# Patient Record
Sex: Female | Born: 1940 | Race: White | Hispanic: No | Marital: Married | Smoking: Never smoker
Health system: Southern US, Community
[De-identification: ages and names within clinical notes are randomized; demographics above are authoritative.]

## PROBLEM LIST (undated history)

## (undated) DIAGNOSIS — F419 Anxiety disorder, unspecified: Secondary | ICD-10-CM

## (undated) DIAGNOSIS — E785 Hyperlipidemia, unspecified: Secondary | ICD-10-CM

## (undated) DIAGNOSIS — M75102 Unspecified rotator cuff tear or rupture of left shoulder, not specified as traumatic: Secondary | ICD-10-CM

## (undated) DIAGNOSIS — A281 Cat-scratch disease: Secondary | ICD-10-CM

## (undated) DIAGNOSIS — F329 Major depressive disorder, single episode, unspecified: Secondary | ICD-10-CM

## (undated) DIAGNOSIS — E119 Type 2 diabetes mellitus without complications: Secondary | ICD-10-CM

## (undated) DIAGNOSIS — N3941 Urge incontinence: Secondary | ICD-10-CM

## (undated) DIAGNOSIS — I10 Essential (primary) hypertension: Secondary | ICD-10-CM

## (undated) DIAGNOSIS — F32A Depression, unspecified: Secondary | ICD-10-CM

## (undated) DIAGNOSIS — Z794 Long term (current) use of insulin: Secondary | ICD-10-CM

## (undated) DIAGNOSIS — R7981 Abnormal blood-gas level: Secondary | ICD-10-CM

## (undated) DIAGNOSIS — M1990 Unspecified osteoarthritis, unspecified site: Secondary | ICD-10-CM

## (undated) DIAGNOSIS — A048 Other specified bacterial intestinal infections: Secondary | ICD-10-CM

## (undated) DIAGNOSIS — E039 Hypothyroidism, unspecified: Secondary | ICD-10-CM

## (undated) DIAGNOSIS — I5041 Acute combined systolic (congestive) and diastolic (congestive) heart failure: Secondary | ICD-10-CM

## (undated) DIAGNOSIS — N390 Urinary tract infection, site not specified: Secondary | ICD-10-CM

## (undated) DIAGNOSIS — I2510 Atherosclerotic heart disease of native coronary artery without angina pectoris: Secondary | ICD-10-CM

## (undated) DIAGNOSIS — H3530 Unspecified macular degeneration: Secondary | ICD-10-CM

## (undated) DIAGNOSIS — K219 Gastro-esophageal reflux disease without esophagitis: Secondary | ICD-10-CM

## (undated) DIAGNOSIS — E042 Nontoxic multinodular goiter: Secondary | ICD-10-CM

## (undated) DIAGNOSIS — C44311 Basal cell carcinoma of skin of nose: Secondary | ICD-10-CM

## (undated) DIAGNOSIS — M7552 Bursitis of left shoulder: Secondary | ICD-10-CM

## (undated) HISTORY — DX: Acute combined systolic (congestive) and diastolic (congestive) heart failure: I50.41

## (undated) HISTORY — DX: Essential (primary) hypertension: I10

## (undated) HISTORY — DX: Unspecified macular degeneration: H35.30

## (undated) HISTORY — PX: BUNIONECTOMY WITH HAMMERTOE RECONSTRUCTION: SHX5600

## (undated) HISTORY — DX: Other specified bacterial intestinal infections: A04.8

## (undated) HISTORY — PX: COLONOSCOPY W/ POLYPECTOMY: SHX1380

## (undated) HISTORY — DX: Major depressive disorder, single episode, unspecified: F32.9

## (undated) HISTORY — DX: Nontoxic multinodular goiter: E04.2

## (undated) HISTORY — PX: ESOPHAGOGASTRODUODENOSCOPY ENDOSCOPY: SHX5814

## (undated) HISTORY — PX: JOINT REPLACEMENT: SHX530

## (undated) HISTORY — PX: APPENDECTOMY: SHX54

## (undated) HISTORY — PX: LIPOMA EXCISION: SHX5283

## (undated) HISTORY — PX: TONSILLECTOMY: SUR1361

## (undated) HISTORY — DX: Abnormal blood-gas level: R79.81

## (undated) HISTORY — DX: Hyperlipidemia, unspecified: E78.5

## (undated) HISTORY — DX: Depression, unspecified: F32.A

## (undated) HISTORY — PX: BACK SURGERY: SHX140

## (undated) HISTORY — PX: BLADDER NECK SUSPENSION: SHX1240

## (undated) HISTORY — PX: BASAL CELL CARCINOMA EXCISION: SHX1214

## (undated) HISTORY — PX: VAGINAL HYSTERECTOMY: SUR661

## (undated) HISTORY — DX: Unspecified osteoarthritis, unspecified site: M19.90

---

## 1994-08-17 ENCOUNTER — Encounter (INDEPENDENT_AMBULATORY_CARE_PROVIDER_SITE_OTHER): Payer: Self-pay | Admitting: *Deleted

## 1994-08-17 LAB — CONVERTED CEMR LAB

## 1998-06-06 ENCOUNTER — Ambulatory Visit (HOSPITAL_COMMUNITY): Admission: RE | Admit: 1998-06-06 | Discharge: 1998-06-06 | Payer: Self-pay | Admitting: Orthopedic Surgery

## 1998-06-20 ENCOUNTER — Encounter: Admission: RE | Admit: 1998-06-20 | Discharge: 1998-06-20 | Payer: Self-pay | Admitting: Family Medicine

## 1998-07-11 ENCOUNTER — Encounter: Admission: RE | Admit: 1998-07-11 | Discharge: 1998-07-11 | Payer: Self-pay | Admitting: Family Medicine

## 1998-08-08 ENCOUNTER — Encounter: Admission: RE | Admit: 1998-08-08 | Discharge: 1998-08-08 | Payer: Self-pay | Admitting: Family Medicine

## 1998-10-15 ENCOUNTER — Encounter: Admission: RE | Admit: 1998-10-15 | Discharge: 1998-10-15 | Payer: Self-pay | Admitting: Family Medicine

## 1998-10-17 ENCOUNTER — Encounter: Admission: RE | Admit: 1998-10-17 | Discharge: 1998-10-17 | Payer: Self-pay | Admitting: Family Medicine

## 1998-10-30 ENCOUNTER — Ambulatory Visit (HOSPITAL_COMMUNITY): Admission: RE | Admit: 1998-10-30 | Discharge: 1998-10-30 | Payer: Self-pay | Admitting: Orthopedic Surgery

## 1999-01-16 ENCOUNTER — Encounter: Admission: RE | Admit: 1999-01-16 | Discharge: 1999-01-16 | Payer: Self-pay | Admitting: Family Medicine

## 1999-01-21 ENCOUNTER — Encounter: Admission: RE | Admit: 1999-01-21 | Discharge: 1999-01-21 | Payer: Self-pay | Admitting: Family Medicine

## 1999-04-16 ENCOUNTER — Ambulatory Visit (HOSPITAL_COMMUNITY): Admission: RE | Admit: 1999-04-16 | Discharge: 1999-04-16 | Payer: Self-pay | Admitting: Family Medicine

## 1999-04-16 ENCOUNTER — Encounter: Payer: Self-pay | Admitting: Family Medicine

## 1999-04-24 ENCOUNTER — Ambulatory Visit (HOSPITAL_COMMUNITY): Admission: RE | Admit: 1999-04-24 | Discharge: 1999-04-24 | Payer: Self-pay | Admitting: Family Medicine

## 1999-04-24 ENCOUNTER — Encounter: Payer: Self-pay | Admitting: Family Medicine

## 1999-06-12 ENCOUNTER — Ambulatory Visit (HOSPITAL_COMMUNITY): Admission: RE | Admit: 1999-06-12 | Discharge: 1999-06-12 | Payer: Self-pay | Admitting: *Deleted

## 1999-06-12 ENCOUNTER — Encounter (INDEPENDENT_AMBULATORY_CARE_PROVIDER_SITE_OTHER): Payer: Self-pay | Admitting: Specialist

## 1999-12-06 ENCOUNTER — Encounter: Admission: RE | Admit: 1999-12-06 | Discharge: 1999-12-06 | Payer: Self-pay | Admitting: Family Medicine

## 1999-12-19 ENCOUNTER — Encounter: Admission: RE | Admit: 1999-12-19 | Discharge: 1999-12-19 | Payer: Self-pay | Admitting: Family Medicine

## 2000-01-13 ENCOUNTER — Encounter: Payer: Self-pay | Admitting: Sports Medicine

## 2000-01-13 ENCOUNTER — Encounter: Admission: RE | Admit: 2000-01-13 | Discharge: 2000-01-13 | Payer: Self-pay | Admitting: Sports Medicine

## 2000-02-03 ENCOUNTER — Encounter: Admission: RE | Admit: 2000-02-03 | Discharge: 2000-02-03 | Payer: Self-pay | Admitting: Family Medicine

## 2000-02-24 ENCOUNTER — Encounter: Admission: RE | Admit: 2000-02-24 | Discharge: 2000-02-24 | Payer: Self-pay | Admitting: Family Medicine

## 2000-04-06 ENCOUNTER — Encounter: Admission: RE | Admit: 2000-04-06 | Discharge: 2000-04-06 | Payer: Self-pay | Admitting: Family Medicine

## 2000-05-05 ENCOUNTER — Encounter: Payer: Self-pay | Admitting: Orthopedic Surgery

## 2000-05-06 ENCOUNTER — Inpatient Hospital Stay (HOSPITAL_COMMUNITY): Admission: RE | Admit: 2000-05-06 | Discharge: 2000-05-11 | Payer: Self-pay | Admitting: Orthopedic Surgery

## 2000-05-06 HISTORY — PX: TOTAL KNEE ARTHROPLASTY: SHX125

## 2000-05-08 ENCOUNTER — Encounter: Payer: Self-pay | Admitting: Orthopedic Surgery

## 2000-06-23 ENCOUNTER — Encounter: Admission: RE | Admit: 2000-06-23 | Discharge: 2000-06-23 | Payer: Self-pay | Admitting: Family Medicine

## 2000-07-17 ENCOUNTER — Encounter: Admission: RE | Admit: 2000-07-17 | Discharge: 2000-07-17 | Payer: Self-pay | Admitting: Family Medicine

## 2000-08-03 ENCOUNTER — Encounter: Admission: RE | Admit: 2000-08-03 | Discharge: 2000-08-03 | Payer: Self-pay | Admitting: Sports Medicine

## 2000-08-24 ENCOUNTER — Encounter: Admission: RE | Admit: 2000-08-24 | Discharge: 2000-08-24 | Payer: Self-pay | Admitting: Sports Medicine

## 2000-10-23 ENCOUNTER — Encounter: Admission: RE | Admit: 2000-10-23 | Discharge: 2000-10-23 | Payer: Self-pay | Admitting: Family Medicine

## 2000-12-07 ENCOUNTER — Ambulatory Visit (HOSPITAL_COMMUNITY): Admission: RE | Admit: 2000-12-07 | Discharge: 2000-12-07 | Payer: Self-pay | Admitting: Gastroenterology

## 2001-01-14 ENCOUNTER — Encounter: Payer: Self-pay | Admitting: Sports Medicine

## 2001-01-14 ENCOUNTER — Encounter: Admission: RE | Admit: 2001-01-14 | Discharge: 2001-01-14 | Payer: Self-pay | Admitting: Sports Medicine

## 2001-02-23 ENCOUNTER — Encounter: Admission: RE | Admit: 2001-02-23 | Discharge: 2001-02-23 | Payer: Self-pay | Admitting: Family Medicine

## 2001-06-04 ENCOUNTER — Encounter: Admission: RE | Admit: 2001-06-04 | Discharge: 2001-06-04 | Payer: Self-pay | Admitting: Family Medicine

## 2001-07-01 ENCOUNTER — Inpatient Hospital Stay (HOSPITAL_COMMUNITY): Admission: EM | Admit: 2001-07-01 | Discharge: 2001-07-05 | Payer: Self-pay | Admitting: Emergency Medicine

## 2001-07-01 ENCOUNTER — Encounter: Payer: Self-pay | Admitting: *Deleted

## 2001-07-01 ENCOUNTER — Encounter: Admission: RE | Admit: 2001-07-01 | Discharge: 2001-07-01 | Payer: Self-pay | Admitting: Family Medicine

## 2001-07-04 ENCOUNTER — Encounter: Payer: Self-pay | Admitting: Family Medicine

## 2001-07-26 ENCOUNTER — Encounter: Admission: RE | Admit: 2001-07-26 | Discharge: 2001-07-26 | Payer: Self-pay | Admitting: Family Medicine

## 2001-08-09 ENCOUNTER — Encounter: Payer: Self-pay | Admitting: Sports Medicine

## 2001-08-09 ENCOUNTER — Encounter: Admission: RE | Admit: 2001-08-09 | Discharge: 2001-08-09 | Payer: Self-pay | Admitting: Sports Medicine

## 2001-09-14 ENCOUNTER — Encounter: Admission: RE | Admit: 2001-09-14 | Discharge: 2001-09-14 | Payer: Self-pay | Admitting: Sports Medicine

## 2001-09-20 ENCOUNTER — Encounter: Admission: RE | Admit: 2001-09-20 | Discharge: 2001-09-20 | Payer: Self-pay | Admitting: Sports Medicine

## 2001-12-22 ENCOUNTER — Encounter: Admission: RE | Admit: 2001-12-22 | Discharge: 2001-12-22 | Payer: Self-pay | Admitting: Family Medicine

## 2002-01-17 ENCOUNTER — Encounter: Payer: Self-pay | Admitting: Sports Medicine

## 2002-01-17 ENCOUNTER — Encounter: Admission: RE | Admit: 2002-01-17 | Discharge: 2002-01-17 | Payer: Self-pay | Admitting: Sports Medicine

## 2002-02-01 ENCOUNTER — Encounter: Admission: RE | Admit: 2002-02-01 | Discharge: 2002-02-01 | Payer: Self-pay | Admitting: Family Medicine

## 2002-02-28 ENCOUNTER — Encounter: Admission: RE | Admit: 2002-02-28 | Discharge: 2002-02-28 | Payer: Self-pay | Admitting: Family Medicine

## 2002-03-01 ENCOUNTER — Encounter: Admission: RE | Admit: 2002-03-01 | Discharge: 2002-03-01 | Payer: Self-pay | Admitting: Sports Medicine

## 2002-03-01 ENCOUNTER — Encounter: Payer: Self-pay | Admitting: Sports Medicine

## 2002-04-26 ENCOUNTER — Encounter: Admission: RE | Admit: 2002-04-26 | Discharge: 2002-04-26 | Payer: Self-pay | Admitting: Sports Medicine

## 2002-08-30 ENCOUNTER — Encounter: Admission: RE | Admit: 2002-08-30 | Discharge: 2002-08-30 | Payer: Self-pay | Admitting: Sports Medicine

## 2002-09-30 ENCOUNTER — Encounter: Admission: RE | Admit: 2002-09-30 | Discharge: 2002-09-30 | Payer: Self-pay | Admitting: Family Medicine

## 2002-11-18 ENCOUNTER — Encounter: Admission: RE | Admit: 2002-11-18 | Discharge: 2002-11-18 | Payer: Self-pay | Admitting: Family Medicine

## 2002-11-22 ENCOUNTER — Encounter: Admission: RE | Admit: 2002-11-22 | Discharge: 2002-11-22 | Payer: Self-pay | Admitting: Family Medicine

## 2002-12-08 ENCOUNTER — Encounter: Admission: RE | Admit: 2002-12-08 | Discharge: 2002-12-08 | Payer: Self-pay | Admitting: Family Medicine

## 2003-01-11 ENCOUNTER — Encounter: Admission: RE | Admit: 2003-01-11 | Discharge: 2003-01-11 | Payer: Self-pay | Admitting: Family Medicine

## 2003-01-11 ENCOUNTER — Encounter: Payer: Self-pay | Admitting: Sports Medicine

## 2003-01-11 ENCOUNTER — Encounter: Admission: RE | Admit: 2003-01-11 | Discharge: 2003-01-11 | Payer: Self-pay | Admitting: Sports Medicine

## 2003-01-20 ENCOUNTER — Encounter: Admission: RE | Admit: 2003-01-20 | Discharge: 2003-01-20 | Payer: Self-pay | Admitting: Sports Medicine

## 2003-01-20 ENCOUNTER — Encounter: Payer: Self-pay | Admitting: Sports Medicine

## 2003-01-20 ENCOUNTER — Encounter: Admission: RE | Admit: 2003-01-20 | Discharge: 2003-01-20 | Payer: Self-pay | Admitting: Family Medicine

## 2003-02-06 ENCOUNTER — Encounter: Admission: RE | Admit: 2003-02-06 | Discharge: 2003-02-06 | Payer: Self-pay | Admitting: Family Medicine

## 2003-02-22 ENCOUNTER — Encounter: Admission: RE | Admit: 2003-02-22 | Discharge: 2003-02-22 | Payer: Self-pay | Admitting: Family Medicine

## 2003-03-09 ENCOUNTER — Encounter: Admission: RE | Admit: 2003-03-09 | Discharge: 2003-03-09 | Payer: Self-pay | Admitting: Family Medicine

## 2003-03-21 ENCOUNTER — Encounter: Payer: Self-pay | Admitting: Orthopedic Surgery

## 2003-03-28 ENCOUNTER — Observation Stay (HOSPITAL_COMMUNITY): Admission: RE | Admit: 2003-03-28 | Discharge: 2003-03-29 | Payer: Self-pay | Admitting: Orthopedic Surgery

## 2003-03-28 HISTORY — PX: SHOULDER OPEN ROTATOR CUFF REPAIR: SHX2407

## 2003-05-29 ENCOUNTER — Encounter: Admission: RE | Admit: 2003-05-29 | Discharge: 2003-05-29 | Payer: Self-pay | Admitting: Family Medicine

## 2003-08-21 ENCOUNTER — Encounter: Admission: RE | Admit: 2003-08-21 | Discharge: 2003-08-21 | Payer: Self-pay | Admitting: Family Medicine

## 2003-09-22 ENCOUNTER — Encounter: Admission: RE | Admit: 2003-09-22 | Discharge: 2003-09-22 | Payer: Self-pay | Admitting: Family Medicine

## 2003-10-02 ENCOUNTER — Encounter: Admission: RE | Admit: 2003-10-02 | Discharge: 2003-10-02 | Payer: Self-pay | Admitting: Family Medicine

## 2003-12-18 ENCOUNTER — Encounter: Admission: RE | Admit: 2003-12-18 | Discharge: 2003-12-18 | Payer: Self-pay | Admitting: Family Medicine

## 2004-01-25 ENCOUNTER — Encounter: Admission: RE | Admit: 2004-01-25 | Discharge: 2004-01-25 | Payer: Self-pay | Admitting: Sports Medicine

## 2004-03-04 ENCOUNTER — Encounter: Admission: RE | Admit: 2004-03-04 | Discharge: 2004-03-04 | Payer: Self-pay | Admitting: Family Medicine

## 2004-03-12 ENCOUNTER — Encounter: Admission: RE | Admit: 2004-03-12 | Discharge: 2004-03-12 | Payer: Self-pay | Admitting: Sports Medicine

## 2004-06-15 ENCOUNTER — Emergency Department (HOSPITAL_COMMUNITY): Admission: EM | Admit: 2004-06-15 | Discharge: 2004-06-15 | Payer: Self-pay | Admitting: Family Medicine

## 2004-09-05 ENCOUNTER — Ambulatory Visit: Payer: Self-pay | Admitting: Family Medicine

## 2004-12-09 ENCOUNTER — Ambulatory Visit: Payer: Self-pay | Admitting: Family Medicine

## 2004-12-10 ENCOUNTER — Ambulatory Visit: Payer: Self-pay | Admitting: Family Medicine

## 2004-12-25 ENCOUNTER — Ambulatory Visit: Payer: Self-pay | Admitting: Family Medicine

## 2005-01-27 ENCOUNTER — Encounter: Admission: RE | Admit: 2005-01-27 | Discharge: 2005-01-27 | Payer: Self-pay | Admitting: Sports Medicine

## 2005-02-11 ENCOUNTER — Ambulatory Visit (HOSPITAL_COMMUNITY): Admission: RE | Admit: 2005-02-11 | Discharge: 2005-02-11 | Payer: Self-pay | Admitting: Family Medicine

## 2005-02-11 ENCOUNTER — Ambulatory Visit: Payer: Self-pay | Admitting: Family Medicine

## 2005-02-14 ENCOUNTER — Ambulatory Visit (HOSPITAL_COMMUNITY): Admission: RE | Admit: 2005-02-14 | Discharge: 2005-02-14 | Payer: Self-pay | Admitting: Sports Medicine

## 2005-02-17 ENCOUNTER — Ambulatory Visit: Payer: Self-pay | Admitting: Family Medicine

## 2005-02-20 ENCOUNTER — Encounter (INDEPENDENT_AMBULATORY_CARE_PROVIDER_SITE_OTHER): Payer: Self-pay | Admitting: Cardiology

## 2005-02-20 ENCOUNTER — Ambulatory Visit: Admission: RE | Admit: 2005-02-20 | Discharge: 2005-02-20 | Payer: Self-pay | Admitting: Sports Medicine

## 2005-04-04 ENCOUNTER — Ambulatory Visit: Payer: Self-pay | Admitting: Family Medicine

## 2005-04-15 ENCOUNTER — Ambulatory Visit: Payer: Self-pay | Admitting: Family Medicine

## 2005-04-29 ENCOUNTER — Ambulatory Visit: Payer: Self-pay | Admitting: Family Medicine

## 2005-05-13 ENCOUNTER — Ambulatory Visit: Payer: Self-pay | Admitting: Family Medicine

## 2005-05-29 ENCOUNTER — Ambulatory Visit: Payer: Self-pay | Admitting: Family Medicine

## 2005-06-16 ENCOUNTER — Ambulatory Visit: Payer: Self-pay | Admitting: Family Medicine

## 2005-07-07 ENCOUNTER — Ambulatory Visit: Payer: Self-pay | Admitting: Family Medicine

## 2005-07-16 ENCOUNTER — Ambulatory Visit: Payer: Self-pay | Admitting: Family Medicine

## 2005-07-22 ENCOUNTER — Ambulatory Visit: Payer: Self-pay | Admitting: Sports Medicine

## 2005-07-22 ENCOUNTER — Encounter: Admission: RE | Admit: 2005-07-22 | Discharge: 2005-07-22 | Payer: Self-pay | Admitting: Sports Medicine

## 2005-09-29 ENCOUNTER — Ambulatory Visit: Payer: Self-pay | Admitting: Family Medicine

## 2005-12-29 ENCOUNTER — Ambulatory Visit: Payer: Self-pay | Admitting: Family Medicine

## 2006-01-28 ENCOUNTER — Ambulatory Visit: Payer: Self-pay | Admitting: Family Medicine

## 2006-02-04 ENCOUNTER — Encounter: Admission: RE | Admit: 2006-02-04 | Discharge: 2006-02-04 | Payer: Self-pay | Admitting: Sports Medicine

## 2006-02-25 ENCOUNTER — Ambulatory Visit: Payer: Self-pay | Admitting: Family Medicine

## 2006-03-27 ENCOUNTER — Ambulatory Visit: Payer: Self-pay | Admitting: Family Medicine

## 2006-04-22 ENCOUNTER — Ambulatory Visit (HOSPITAL_COMMUNITY): Admission: RE | Admit: 2006-04-22 | Discharge: 2006-04-22 | Payer: Self-pay | Admitting: Gastroenterology

## 2006-04-22 ENCOUNTER — Encounter (INDEPENDENT_AMBULATORY_CARE_PROVIDER_SITE_OTHER): Payer: Self-pay | Admitting: *Deleted

## 2006-05-21 ENCOUNTER — Ambulatory Visit: Payer: Self-pay | Admitting: Family Medicine

## 2006-06-23 ENCOUNTER — Ambulatory Visit: Payer: Self-pay | Admitting: Family Medicine

## 2006-07-07 ENCOUNTER — Ambulatory Visit: Payer: Self-pay | Admitting: Sports Medicine

## 2006-07-28 ENCOUNTER — Ambulatory Visit: Payer: Self-pay | Admitting: Sports Medicine

## 2006-08-18 ENCOUNTER — Ambulatory Visit: Payer: Self-pay | Admitting: Sports Medicine

## 2006-09-13 ENCOUNTER — Emergency Department (HOSPITAL_COMMUNITY): Admission: EM | Admit: 2006-09-13 | Discharge: 2006-09-13 | Payer: Self-pay | Admitting: Emergency Medicine

## 2006-09-17 ENCOUNTER — Ambulatory Visit: Payer: Self-pay | Admitting: Sports Medicine

## 2006-09-18 ENCOUNTER — Ambulatory Visit: Payer: Self-pay | Admitting: Family Medicine

## 2006-10-12 ENCOUNTER — Ambulatory Visit: Payer: Self-pay | Admitting: Sports Medicine

## 2006-10-20 ENCOUNTER — Ambulatory Visit: Payer: Self-pay | Admitting: Family Medicine

## 2006-11-05 ENCOUNTER — Ambulatory Visit (HOSPITAL_COMMUNITY): Admission: RE | Admit: 2006-11-05 | Discharge: 2006-11-05 | Payer: Self-pay | Admitting: Gastroenterology

## 2006-11-16 ENCOUNTER — Encounter: Admission: RE | Admit: 2006-11-16 | Discharge: 2006-11-16 | Payer: Self-pay | Admitting: Orthopedic Surgery

## 2006-12-14 ENCOUNTER — Ambulatory Visit: Payer: Self-pay | Admitting: Sports Medicine

## 2006-12-25 ENCOUNTER — Encounter: Payer: Self-pay | Admitting: Family Medicine

## 2006-12-25 ENCOUNTER — Ambulatory Visit: Payer: Self-pay | Admitting: Family Medicine

## 2006-12-25 LAB — CONVERTED CEMR LAB
BUN: 21 mg/dL (ref 6–23)
Direct LDL: 86 mg/dL
Potassium: 4.4 meq/L (ref 3.5–5.3)
Sodium: 143 meq/L (ref 135–145)

## 2007-01-04 ENCOUNTER — Inpatient Hospital Stay (HOSPITAL_COMMUNITY): Admission: RE | Admit: 2007-01-04 | Discharge: 2007-01-06 | Payer: Self-pay | Admitting: Orthopedic Surgery

## 2007-01-14 DIAGNOSIS — J309 Allergic rhinitis, unspecified: Secondary | ICD-10-CM | POA: Insufficient documentation

## 2007-01-14 DIAGNOSIS — E039 Hypothyroidism, unspecified: Secondary | ICD-10-CM

## 2007-01-14 DIAGNOSIS — M5430 Sciatica, unspecified side: Secondary | ICD-10-CM

## 2007-01-14 DIAGNOSIS — I10 Essential (primary) hypertension: Secondary | ICD-10-CM

## 2007-01-14 DIAGNOSIS — E669 Obesity, unspecified: Secondary | ICD-10-CM

## 2007-01-14 DIAGNOSIS — E1165 Type 2 diabetes mellitus with hyperglycemia: Secondary | ICD-10-CM

## 2007-01-14 DIAGNOSIS — E7800 Pure hypercholesterolemia, unspecified: Secondary | ICD-10-CM

## 2007-01-14 DIAGNOSIS — M129 Arthropathy, unspecified: Secondary | ICD-10-CM | POA: Insufficient documentation

## 2007-01-15 ENCOUNTER — Encounter (INDEPENDENT_AMBULATORY_CARE_PROVIDER_SITE_OTHER): Payer: Self-pay | Admitting: *Deleted

## 2007-01-19 ENCOUNTER — Telehealth: Payer: Self-pay | Admitting: *Deleted

## 2007-02-04 ENCOUNTER — Ambulatory Visit: Payer: Self-pay | Admitting: Family Medicine

## 2007-02-04 ENCOUNTER — Encounter: Payer: Self-pay | Admitting: Family Medicine

## 2007-02-04 DIAGNOSIS — F329 Major depressive disorder, single episode, unspecified: Secondary | ICD-10-CM

## 2007-02-17 ENCOUNTER — Encounter: Payer: Self-pay | Admitting: Family Medicine

## 2007-02-17 ENCOUNTER — Encounter: Admission: RE | Admit: 2007-02-17 | Discharge: 2007-02-17 | Payer: Self-pay | Admitting: Family Medicine

## 2007-03-23 ENCOUNTER — Telehealth: Payer: Self-pay | Admitting: *Deleted

## 2007-03-31 ENCOUNTER — Encounter: Payer: Self-pay | Admitting: Family Medicine

## 2007-03-31 ENCOUNTER — Ambulatory Visit: Payer: Self-pay | Admitting: Family Medicine

## 2007-05-12 ENCOUNTER — Ambulatory Visit: Payer: Self-pay | Admitting: Family Medicine

## 2007-05-12 ENCOUNTER — Telehealth: Payer: Self-pay | Admitting: *Deleted

## 2007-05-19 ENCOUNTER — Telehealth: Payer: Self-pay | Admitting: Family Medicine

## 2007-05-28 ENCOUNTER — Encounter (INDEPENDENT_AMBULATORY_CARE_PROVIDER_SITE_OTHER): Payer: Self-pay | Admitting: *Deleted

## 2007-05-28 ENCOUNTER — Ambulatory Visit: Payer: Self-pay | Admitting: Family Medicine

## 2007-05-28 ENCOUNTER — Telehealth: Payer: Self-pay | Admitting: *Deleted

## 2007-05-28 LAB — CONVERTED CEMR LAB
ALT: 14 units/L (ref 0–35)
AST: 15 units/L (ref 0–37)
Albumin: 4.5 g/dL (ref 3.5–5.2)
Alkaline Phosphatase: 53 units/L (ref 39–117)
Basophils Absolute: 0 10*3/uL (ref 0.0–0.1)
Basophils Relative: 0 % (ref 0–1)
Eosinophils Absolute: 0.1 10*3/uL (ref 0.0–0.7)
Lymphs Abs: 2.4 10*3/uL (ref 0.7–3.3)
MCV: 91.7 fL (ref 78.0–100.0)
Neutrophils Relative %: 56 % (ref 43–77)
Platelets: 128 10*3/uL — ABNORMAL LOW (ref 150–400)
Potassium: 4.1 meq/L (ref 3.5–5.3)
Sodium: 141 meq/L (ref 135–145)
Total Protein: 7.2 g/dL (ref 6.0–8.3)
WBC: 6.7 10*3/uL (ref 4.0–10.5)

## 2007-05-31 ENCOUNTER — Encounter (INDEPENDENT_AMBULATORY_CARE_PROVIDER_SITE_OTHER): Payer: Self-pay | Admitting: *Deleted

## 2007-06-02 ENCOUNTER — Encounter: Admission: RE | Admit: 2007-06-02 | Discharge: 2007-06-02 | Payer: Self-pay | Admitting: Sports Medicine

## 2007-06-07 ENCOUNTER — Ambulatory Visit: Payer: Self-pay | Admitting: Family Medicine

## 2007-06-29 ENCOUNTER — Ambulatory Visit: Payer: Self-pay | Admitting: Family Medicine

## 2007-06-29 ENCOUNTER — Encounter: Payer: Self-pay | Admitting: Family Medicine

## 2007-06-29 LAB — CONVERTED CEMR LAB
Albumin: 4.3 g/dL (ref 3.5–5.2)
BUN: 26 mg/dL — ABNORMAL HIGH (ref 6–23)
CO2: 23 meq/L (ref 19–32)
Calcium: 9.7 mg/dL (ref 8.4–10.5)
Chloride: 109 meq/L (ref 96–112)
Cholesterol: 156 mg/dL (ref 0–200)
Creatinine, Ser: 0.91 mg/dL (ref 0.40–1.20)
HDL: 36 mg/dL — ABNORMAL LOW (ref >39)
Total CHOL/HDL Ratio: 4.3

## 2007-07-01 ENCOUNTER — Encounter: Payer: Self-pay | Admitting: Family Medicine

## 2007-07-05 ENCOUNTER — Encounter: Admission: RE | Admit: 2007-07-05 | Discharge: 2007-07-05 | Payer: Self-pay | Admitting: Orthopedic Surgery

## 2007-07-16 ENCOUNTER — Telehealth: Payer: Self-pay | Admitting: *Deleted

## 2007-09-02 ENCOUNTER — Telehealth: Payer: Self-pay | Admitting: *Deleted

## 2007-09-13 ENCOUNTER — Telehealth (INDEPENDENT_AMBULATORY_CARE_PROVIDER_SITE_OTHER): Payer: Self-pay | Admitting: *Deleted

## 2007-09-23 ENCOUNTER — Encounter: Payer: Self-pay | Admitting: Family Medicine

## 2007-10-19 ENCOUNTER — Ambulatory Visit: Payer: Self-pay | Admitting: Family Medicine

## 2007-10-19 LAB — CONVERTED CEMR LAB: Hgb A1c MFr Bld: 8.9 %

## 2007-11-01 ENCOUNTER — Encounter (INDEPENDENT_AMBULATORY_CARE_PROVIDER_SITE_OTHER): Payer: Self-pay | Admitting: *Deleted

## 2007-12-08 ENCOUNTER — Telehealth: Payer: Self-pay | Admitting: *Deleted

## 2008-01-05 HISTORY — PX: OTHER SURGICAL HISTORY: SHX169

## 2008-01-18 ENCOUNTER — Encounter (INDEPENDENT_AMBULATORY_CARE_PROVIDER_SITE_OTHER): Payer: Self-pay | Admitting: *Deleted

## 2008-01-21 ENCOUNTER — Encounter: Admission: RE | Admit: 2008-01-21 | Discharge: 2008-01-21 | Payer: Self-pay | Admitting: Orthopedic Surgery

## 2008-02-04 ENCOUNTER — Telehealth (INDEPENDENT_AMBULATORY_CARE_PROVIDER_SITE_OTHER): Payer: Self-pay | Admitting: *Deleted

## 2008-03-02 ENCOUNTER — Encounter: Admission: RE | Admit: 2008-03-02 | Discharge: 2008-03-02 | Payer: Self-pay | Admitting: Family Medicine

## 2008-03-07 ENCOUNTER — Encounter: Payer: Self-pay | Admitting: Family Medicine

## 2008-03-17 ENCOUNTER — Telehealth (INDEPENDENT_AMBULATORY_CARE_PROVIDER_SITE_OTHER): Payer: Self-pay | Admitting: *Deleted

## 2008-03-17 ENCOUNTER — Encounter: Payer: Self-pay | Admitting: Family Medicine

## 2008-03-17 ENCOUNTER — Ambulatory Visit: Payer: Self-pay | Admitting: Family Medicine

## 2008-03-17 LAB — CONVERTED CEMR LAB
AST: 17 units/L (ref 0–37)
Albumin: 4.6 g/dL (ref 3.5–5.2)
BUN: 27 mg/dL — ABNORMAL HIGH (ref 6–23)
Calcium: 9.8 mg/dL (ref 8.4–10.5)
Chloride: 104 meq/L (ref 96–112)
Glucose, Bld: 143 mg/dL — ABNORMAL HIGH (ref 70–99)
Potassium: 4.3 meq/L (ref 3.5–5.3)
Sodium: 141 meq/L (ref 135–145)
Total Protein: 7.2 g/dL (ref 6.0–8.3)

## 2008-04-26 ENCOUNTER — Ambulatory Visit: Payer: Self-pay | Admitting: Family Medicine

## 2008-04-28 ENCOUNTER — Telehealth: Payer: Self-pay | Admitting: Family Medicine

## 2008-05-24 ENCOUNTER — Ambulatory Visit: Payer: Self-pay | Admitting: Family Medicine

## 2008-06-02 ENCOUNTER — Encounter: Payer: Self-pay | Admitting: Family Medicine

## 2008-06-08 ENCOUNTER — Telehealth: Payer: Self-pay | Admitting: *Deleted

## 2008-06-09 ENCOUNTER — Encounter (INDEPENDENT_AMBULATORY_CARE_PROVIDER_SITE_OTHER): Payer: Self-pay | Admitting: Family Medicine

## 2008-06-12 ENCOUNTER — Ambulatory Visit: Payer: Self-pay | Admitting: Family Medicine

## 2008-06-12 ENCOUNTER — Telehealth: Payer: Self-pay | Admitting: *Deleted

## 2008-06-15 ENCOUNTER — Ambulatory Visit (HOSPITAL_COMMUNITY): Admission: RE | Admit: 2008-06-15 | Discharge: 2008-06-16 | Payer: Self-pay | Admitting: Orthopedic Surgery

## 2008-06-15 HISTORY — PX: OTHER SURGICAL HISTORY: SHX169

## 2008-07-26 ENCOUNTER — Encounter: Payer: Self-pay | Admitting: Family Medicine

## 2008-08-16 ENCOUNTER — Ambulatory Visit: Payer: Self-pay | Admitting: Family Medicine

## 2008-08-16 ENCOUNTER — Encounter: Payer: Self-pay | Admitting: Family Medicine

## 2008-08-16 ENCOUNTER — Encounter (INDEPENDENT_AMBULATORY_CARE_PROVIDER_SITE_OTHER): Payer: Self-pay | Admitting: Family Medicine

## 2008-08-17 ENCOUNTER — Encounter (INDEPENDENT_AMBULATORY_CARE_PROVIDER_SITE_OTHER): Payer: Self-pay | Admitting: Family Medicine

## 2008-09-19 ENCOUNTER — Encounter: Payer: Self-pay | Admitting: Family Medicine

## 2008-11-22 ENCOUNTER — Encounter: Payer: Self-pay | Admitting: Family Medicine

## 2008-12-21 ENCOUNTER — Telehealth: Payer: Self-pay | Admitting: Family Medicine

## 2008-12-21 ENCOUNTER — Ambulatory Visit: Payer: Self-pay | Admitting: Family Medicine

## 2008-12-26 ENCOUNTER — Ambulatory Visit: Payer: Self-pay | Admitting: Family Medicine

## 2009-01-17 ENCOUNTER — Ambulatory Visit (HOSPITAL_BASED_OUTPATIENT_CLINIC_OR_DEPARTMENT_OTHER): Admission: RE | Admit: 2009-01-17 | Discharge: 2009-01-17 | Payer: Self-pay | Admitting: *Deleted

## 2009-01-17 ENCOUNTER — Encounter (INDEPENDENT_AMBULATORY_CARE_PROVIDER_SITE_OTHER): Payer: Self-pay | Admitting: *Deleted

## 2009-01-17 HISTORY — PX: GANGLION CYST EXCISION: SHX1691

## 2009-01-23 ENCOUNTER — Ambulatory Visit: Payer: Self-pay | Admitting: Family Medicine

## 2009-01-23 DIAGNOSIS — E739 Lactose intolerance, unspecified: Secondary | ICD-10-CM | POA: Insufficient documentation

## 2009-01-23 LAB — CONVERTED CEMR LAB: Hgb A1c MFr Bld: 10.7 %

## 2009-02-22 ENCOUNTER — Ambulatory Visit: Payer: Self-pay | Admitting: Family Medicine

## 2009-02-22 ENCOUNTER — Encounter: Payer: Self-pay | Admitting: Family Medicine

## 2009-02-22 ENCOUNTER — Encounter (INDEPENDENT_AMBULATORY_CARE_PROVIDER_SITE_OTHER): Payer: Self-pay | Admitting: Family Medicine

## 2009-02-27 ENCOUNTER — Encounter: Payer: Self-pay | Admitting: Family Medicine

## 2009-03-05 ENCOUNTER — Encounter (INDEPENDENT_AMBULATORY_CARE_PROVIDER_SITE_OTHER): Payer: Self-pay | Admitting: Gastroenterology

## 2009-03-05 ENCOUNTER — Ambulatory Visit (HOSPITAL_COMMUNITY): Admission: RE | Admit: 2009-03-05 | Discharge: 2009-03-05 | Payer: Self-pay | Admitting: Gastroenterology

## 2009-03-08 ENCOUNTER — Encounter: Payer: Self-pay | Admitting: Family Medicine

## 2009-03-13 ENCOUNTER — Encounter: Admission: RE | Admit: 2009-03-13 | Discharge: 2009-03-13 | Payer: Self-pay | Admitting: Family Medicine

## 2009-04-02 ENCOUNTER — Encounter: Payer: Self-pay | Admitting: Family Medicine

## 2009-04-02 ENCOUNTER — Ambulatory Visit: Payer: Self-pay | Admitting: Family Medicine

## 2009-04-06 ENCOUNTER — Encounter: Payer: Self-pay | Admitting: Family Medicine

## 2009-04-10 ENCOUNTER — Encounter: Payer: Self-pay | Admitting: Family Medicine

## 2009-05-01 ENCOUNTER — Ambulatory Visit: Payer: Self-pay | Admitting: Family Medicine

## 2009-05-01 LAB — CONVERTED CEMR LAB: Hgb A1c MFr Bld: 10.5 %

## 2009-05-07 ENCOUNTER — Telehealth: Payer: Self-pay | Admitting: Family Medicine

## 2009-05-28 ENCOUNTER — Ambulatory Visit: Payer: Self-pay | Admitting: Family Medicine

## 2009-05-28 ENCOUNTER — Encounter: Payer: Self-pay | Admitting: Family Medicine

## 2009-05-28 LAB — CONVERTED CEMR LAB
AST: 13 units/L (ref 0–37)
Albumin: 4.6 g/dL (ref 3.5–5.2)
BUN: 22 mg/dL (ref 6–23)
CO2: 25 meq/L (ref 19–32)
Calcium: 9.8 mg/dL (ref 8.4–10.5)
Chloride: 102 meq/L (ref 96–112)
Cholesterol: 173 mg/dL (ref 0–200)
Creatinine, Ser: 0.76 mg/dL (ref 0.40–1.20)
Glucose, Bld: 86 mg/dL (ref 70–99)
HCT: 44.3 % (ref 36.0–46.0)
HDL: 47 mg/dL (ref >39)
Hemoglobin: 15.2 g/dL — ABNORMAL HIGH (ref 12.0–15.0)
Potassium: 4.2 meq/L (ref 3.5–5.3)
RBC: 4.66 M/uL (ref 3.87–5.11)
RDW: 13.1 % (ref 11.5–15.5)
TSH: 1.167 microintl units/mL (ref 0.350–4.500)
Total CHOL/HDL Ratio: 3.7
Triglycerides: 202 mg/dL — ABNORMAL HIGH (ref <150)

## 2009-05-29 ENCOUNTER — Encounter: Payer: Self-pay | Admitting: Family Medicine

## 2009-05-29 ENCOUNTER — Telehealth: Payer: Self-pay | Admitting: Family Medicine

## 2009-07-20 ENCOUNTER — Ambulatory Visit: Payer: Self-pay | Admitting: Family Medicine

## 2009-07-25 ENCOUNTER — Encounter: Admission: RE | Admit: 2009-07-25 | Discharge: 2009-07-25 | Payer: Self-pay | Admitting: Family Medicine

## 2009-08-10 ENCOUNTER — Encounter: Payer: Self-pay | Admitting: Family Medicine

## 2009-09-26 ENCOUNTER — Ambulatory Visit: Payer: Self-pay | Admitting: Family Medicine

## 2009-09-26 ENCOUNTER — Encounter: Payer: Self-pay | Admitting: Family Medicine

## 2009-09-26 LAB — CONVERTED CEMR LAB
Bilirubin Urine: NEGATIVE
Glucose, Urine, Semiquant: NEGATIVE
Protein, U semiquant: NEGATIVE
Urobilinogen, UA: 0.2
pH: 5.5

## 2009-10-05 ENCOUNTER — Encounter (INDEPENDENT_AMBULATORY_CARE_PROVIDER_SITE_OTHER): Payer: Self-pay | Admitting: Pharmacist

## 2009-10-05 ENCOUNTER — Ambulatory Visit: Payer: Self-pay | Admitting: Family Medicine

## 2009-10-05 LAB — CONVERTED CEMR LAB: TSH: 1.906 microintl units/mL (ref 0.350–4.500)

## 2009-10-08 ENCOUNTER — Encounter: Payer: Self-pay | Admitting: Family Medicine

## 2009-10-19 ENCOUNTER — Telehealth: Payer: Self-pay | Admitting: Family Medicine

## 2009-10-19 ENCOUNTER — Ambulatory Visit: Payer: Self-pay | Admitting: Family Medicine

## 2009-11-20 ENCOUNTER — Ambulatory Visit: Payer: Self-pay | Admitting: Family Medicine

## 2010-01-08 ENCOUNTER — Ambulatory Visit (HOSPITAL_COMMUNITY): Admission: RE | Admit: 2010-01-08 | Discharge: 2010-01-08 | Payer: Self-pay | Admitting: Orthopedic Surgery

## 2010-01-14 ENCOUNTER — Encounter: Admission: RE | Admit: 2010-01-14 | Discharge: 2010-01-14 | Payer: Self-pay | Admitting: Family Medicine

## 2010-01-14 ENCOUNTER — Telehealth: Payer: Self-pay | Admitting: Family Medicine

## 2010-01-14 ENCOUNTER — Ambulatory Visit: Payer: Self-pay | Admitting: Family Medicine

## 2010-01-14 LAB — CONVERTED CEMR LAB: Hgb A1c MFr Bld: 9.3 %

## 2010-03-27 ENCOUNTER — Ambulatory Visit: Payer: Self-pay | Admitting: Family Medicine

## 2010-03-27 ENCOUNTER — Encounter: Payer: Self-pay | Admitting: Psychology

## 2010-03-28 ENCOUNTER — Encounter (INDEPENDENT_AMBULATORY_CARE_PROVIDER_SITE_OTHER): Payer: Self-pay | Admitting: Family Medicine

## 2010-03-28 ENCOUNTER — Ambulatory Visit (HOSPITAL_COMMUNITY)
Admission: RE | Admit: 2010-03-28 | Discharge: 2010-03-28 | Payer: Self-pay | Source: Home / Self Care | Admitting: Family Medicine

## 2010-03-28 ENCOUNTER — Ambulatory Visit: Payer: Self-pay | Admitting: Vascular Surgery

## 2010-04-10 ENCOUNTER — Telehealth: Payer: Self-pay | Admitting: Psychology

## 2010-04-25 ENCOUNTER — Encounter: Admission: RE | Admit: 2010-04-25 | Discharge: 2010-04-25 | Payer: Self-pay | Admitting: Family Medicine

## 2010-05-07 ENCOUNTER — Ambulatory Visit: Payer: Self-pay | Admitting: Family Medicine

## 2010-05-14 ENCOUNTER — Ambulatory Visit (HOSPITAL_COMMUNITY): Admission: RE | Admit: 2010-05-14 | Discharge: 2010-05-14 | Payer: Self-pay | Admitting: Gastroenterology

## 2010-08-02 ENCOUNTER — Encounter: Payer: Self-pay | Admitting: Family Medicine

## 2010-08-02 ENCOUNTER — Ambulatory Visit: Payer: Self-pay | Admitting: Family Medicine

## 2010-08-02 LAB — CONVERTED CEMR LAB
Albumin: 4.5 g/dL (ref 3.5–5.2)
Alkaline Phosphatase: 53 units/L (ref 39–117)
BUN: 16 mg/dL (ref 6–23)
CO2: 25 meq/L (ref 19–32)
Cholesterol: 179 mg/dL (ref 0–200)
Glucose, Bld: 97 mg/dL (ref 70–99)
HDL: 46 mg/dL (ref >39)
Potassium: 4.2 meq/L (ref 3.5–5.3)
Sodium: 142 meq/L (ref 135–145)
Total Bilirubin: 0.6 mg/dL (ref 0.3–1.2)
Total Protein: 7 g/dL (ref 6.0–8.3)
Triglycerides: 153 mg/dL — ABNORMAL HIGH (ref <150)

## 2010-08-20 ENCOUNTER — Telehealth: Payer: Self-pay | Admitting: Family Medicine

## 2010-08-20 ENCOUNTER — Ambulatory Visit: Payer: Self-pay | Admitting: Family Medicine

## 2010-08-20 DIAGNOSIS — R21 Rash and other nonspecific skin eruption: Secondary | ICD-10-CM

## 2010-08-28 ENCOUNTER — Ambulatory Visit: Payer: Self-pay | Admitting: Family Medicine

## 2010-09-20 ENCOUNTER — Ambulatory Visit: Payer: Self-pay | Admitting: Family Medicine

## 2010-09-20 DIAGNOSIS — I872 Venous insufficiency (chronic) (peripheral): Secondary | ICD-10-CM | POA: Insufficient documentation

## 2010-10-08 ENCOUNTER — Ambulatory Visit: Payer: Self-pay | Admitting: Family Medicine

## 2010-11-01 ENCOUNTER — Telehealth: Payer: Self-pay | Admitting: Family Medicine

## 2010-11-05 ENCOUNTER — Ambulatory Visit: Payer: Self-pay | Admitting: Family Medicine

## 2010-11-20 ENCOUNTER — Encounter: Payer: Self-pay | Admitting: Family Medicine

## 2010-12-03 ENCOUNTER — Ambulatory Visit: Admission: RE | Admit: 2010-12-03 | Discharge: 2010-12-03 | Payer: Self-pay | Source: Home / Self Care

## 2010-12-03 DIAGNOSIS — R300 Dysuria: Secondary | ICD-10-CM | POA: Insufficient documentation

## 2010-12-03 LAB — CONVERTED CEMR LAB
Bilirubin Urine: NEGATIVE
Blood in Urine, dipstick: NEGATIVE
Glucose, Urine, Semiquant: 500
Protein, U semiquant: NEGATIVE
Urobilinogen, UA: 0.2
pH: 5

## 2010-12-04 ENCOUNTER — Encounter: Payer: Self-pay | Admitting: Family Medicine

## 2010-12-08 ENCOUNTER — Encounter: Payer: Self-pay | Admitting: Orthopedic Surgery

## 2010-12-19 NOTE — Progress Notes (Signed)
Summary: phn msg  Phone Note From Other Clinic Call back at [phone]   Caller: [place] Summary of Call: has a question about a drug interaction - about Simvastatin ref # [id number] Initial call taken by: Audie Clear,  November 01, 2010 2:13 PM  Follow-up for Phone Call        We will change statin to atorvastatin 20mg  daily. Script is faxed.  Follow-up by: Luis Abed MD,  November 04, 2010 12:32 PM     Appended Document: phn msg    Clinical Lists Changes  Medications: Removed medication of ZOCOR 40 MG TABS (SIMVASTATIN) Take 1and 1/2  tablet by mouth once a day Added new medication of LIPITOR 20 MG TABS (ATORVASTATIN CALCIUM) take one tab by mouth daily. - Signed Rx of LIPITOR 20 MG TABS (ATORVASTATIN CALCIUM) take one tab by mouth daily.;  #30 x 5;  Signed;  Entered by: Luis Abed MD;  Authorized by: Luis Abed MD;  Method used: Telephoned to [id number]*, [address], [place], [place], [place]  [address], Ph: [phone], Fax: [phone]    Prescriptions: LIPITOR 20 MG TABS (ATORVASTATIN CALCIUM) take one tab by mouth daily.  #30 x 5   Entered and Authorized by:   Luis Abed MD   Signed by:   Luis Abed MD on 11/04/2010   Method used:   Telephoned to ...       [facility]  [address] [phone]* (retail)       [address]       [place], [place]  [address]       Ph: [phone]       Fax: [phone]   RxID:   [id number]

## 2010-12-19 NOTE — Assessment & Plan Note (Signed)
Summary: fu/kh   Vital Signs:  Patient profile:   70 year old female Height:      64 inches Weight:      203.8 pounds BMI:     35.11 Temp:     98.5 degrees F oral Pulse rate:   69 / minute BP sitting:   136 / 80  (left arm) Cuff size:   large  Vitals Entered By: Isabelle Course (January 14, 2010 10:45 AM) CC: F/U DM, htn, hld, left ankle pain Is Patient Diabetic? Yes Did you bring your meter with you today? No Pain Assessment Patient in pain? no        Primary Care Provider:  Carin Hock MD  CC:  F/U DM, htn, hld, and left ankle pain.  History of Present Illness: 1.  dm--recently seen at pharmacy clinic for diabetes managment.  insulin adjusted.  currently on lantus 60 units bid, novolog 35 at breakfast adn dinner and 15 at lunch, and byetta.  A1C better at 9.3 (had been in the 10s past 3 appointments).  Brings in sugar log.   fasting am:  100-160 post-dinner:  105-195  still under lots of stress and does not always remember to take her insulin.  but she is remembering more often than before.    2.  hypertension--reasonable, but not at goal today at 136/80.  on hyzaar and amlodipine  3.  hyperlipidemia--ldl at goal on 60 mg of zocor  4.  left ankle pain and swelling-- for 1 week or more.  did fall several times on the ice several weeks ago.  does not remember specific trauma to ankle, though.  no calf swelling or pain.  no recent immobility.  walking normally, but ankle does hurt at the end of the day.    Habits & Providers  Alcohol-Tobacco-Diet     Tobacco Status: never  Current Medications (verified): 1)  Bayer Childrens Aspirin 81 Mg Chew (Aspirin) .... Take 1 Tablet By Mouth Once A Day 2)  Byetta 10 Mcg Pen 10 Mcg/0.64ml Soln (Exenatide) .... Inject 10 Mcg Subcutaneously Twice A Day 3)  Hyzaar 100-25 Mg Tabs (Losartan Potassium-Hctz) .... Take 1 Tablet By Mouth Once A Day 4)  Lantus 100 Unit/ml Soln (Insulin Glargine) .... Inject 60 Unit Subcutaneously Twice  A Day.  Dispense Qs X 89month 5)  Loratadine 10 Mg Tabs (Loratadine) .... Take 1 Tablet By Mouth Once A Day 6)  Synthroid 50 Mcg Tabs (Levothyroxine Sodium) .Marland Kitchen.. 1 Tab By Mouth Daily 7)  Zocor 40 Mg Tabs (Simvastatin) .... Take 1and 1/2  Tablet By Mouth Once A Day 8)  Novolog Flexpen 100 Unit/ml  Soln (Insulin Aspart) .... 35 Units With Breakfast and Dinner, 15 Units With Lunch 9)  Ulticare Short Pen Needles 31g X 8 Mm  Misc (Insulin Pen Needle) .... Use As Directed.  Dispense 1 Box 10)  Celexa 20 Mg  Tabs (Citalopram Hydrobromide) .Marland Kitchen.. 1 Tab By Mouth Daily 11)  Amlodipine Besylate 5 Mg Tabs (Amlodipine Besylate) .... Take 1 Tab By Mouth Daily For Blood Pressure 12)  Caltrate 600+d 600-400 Mg-Unit Tabs (Calcium Carbonate-Vitamin D) .... 2 Tabs By Mouth Daily 13)  Cyclobenzaprine Hcl 5 Mg Tabs (Cyclobenzaprine Hcl) .Marland Kitchen.. 1-2 Tabs By Mouth Three Times A Day As Needed Muscle Spasm 14)  Fish Oil   Oil (Fish Oil) .... 1000mg  Two Times A Day  Purchases Otc Fish Oil  Allergies: 1)  Avandia (Rosiglitazone Maleate) 2)  Ultracet (Tramadol-Acetaminophen) 3)  Morphine  Past History:  Past Medical History: Last updated: 01/23/2009 benign multinodular goiter, self resolved  h pylori tx 7/98, 7/08 pneumonia-hosp 8/02,  Rt rotator cuff tear 3/04 s/p surgical repair DM HTN TKA in 2000 multiple colon polyps (precancerous)--follows with Dr. Amedeo Plenty lactose intolerance implantable stimulator placed 7/09  Physical Exam  General:  pleasant, well-groomed, overweight, nad, tired appearing  Lungs:  CTAB  Heart:  Normal rate and regular rhythm. S1 and S2 normal without gallop, murmur, click, rub or other extra sounds. Extremities:  left ankle edematous, but not erythematous.  very mild ttp.  full rom.  normal gait.  great toe has contusion under nail bed.  absent 2nd digit toenail.  no left calf swelling, calf tenderness, or palpable cords.    right calf measures about 1 cm greater than left calf.     Additional Exam:  vital signs reviewed    Impression & Recommendations:  Problem # 1:  DIABETES MELLITUS II, UNCOMPLICATED (XX123456) Assessment Improved not at goal, but better.  main issue is that she needs to take every dose of insulin.  cbgs actually look pretty good.  husband recently had CABG; so lots of stress.  hopefully her insulin compliance will improve now that things have settled down.  RTC 3 months Her updated medication list for this problem includes:    Bayer Childrens Aspirin 81 Mg Chew (Aspirin) .Marland Kitchen... Take 1 tablet by mouth once a day    Byetta 10 Mcg Pen 10 Mcg/0.51ml Soln (Exenatide) ..... Inject 10 mcg subcutaneously twice a day    Hyzaar 100-25 Mg Tabs (Losartan potassium-hctz) .Marland Kitchen... Take 1 tablet by mouth once a day    Lantus 100 Unit/ml Soln (Insulin glargine) ..... Inject 60 unit subcutaneously twice a day.  dispense qs x 75month    Novolog Flexpen 100 Unit/ml Soln (Insulin aspart) .Marland KitchenMarland KitchenMarland KitchenMarland Kitchen 35 units with breakfast and dinner, 15 units with lunch  Orders: A1C-[facility] [id number]) [place]- Est  Level 4 [id number])  Problem # 2:  HYPERTENSION, BENIGN SYSTEMIC (ICD-401.1) Assessment: Unchanged  a little above goal.  if high next visit, increase antihypertensives. Her updated medication list for this problem includes:    Hyzaar 100-25 Mg Tabs (Losartan potassium-hctz) .Marland Kitchen... Take 1 tablet by mouth once a day    Amlodipine Besylate 5 Mg Tabs (Amlodipine besylate) .Marland Kitchen... Take 1 tab by mouth daily for blood pressure  Orders: [place]- Est  Level 4 (99214)  Problem # 3:  HYPERCHOLESTEROLEMIA (ICD-272.0) Assessment: Unchanged  at goal.   Her updated medication list for this problem includes:    Zocor 40 Mg Tabs (Simvastatin) .Marland Kitchen... Take 1and 1/2  tablet by mouth once a day  Orders: [place]- Est  Level 4 [id number])  Problem # 4:  ANKLE PAIN, LEFT (ICD-719.47) Assessment: New has actually had a sprain of this ankle before.  x-ray to rule out occult fracture.  if negative, ice, rest and  compression Orders: Radiology other (Radiology Other) [facility]- Est  Level 4 [id number])  Complete Medication List: 1)  Bayer Childrens Aspirin 81 Mg Chew (Aspirin) .... Take 1 tablet by mouth once a day 2)  Byetta 10 Mcg Pen 10 Mcg/0.104ml Soln (Exenatide) .... Inject 10 mcg subcutaneously twice a day 3)  Hyzaar 100-25 Mg Tabs (Losartan potassium-hctz) .... Take 1 tablet by mouth once a day 4)  Lantus 100 Unit/ml Soln (Insulin glargine) .... Inject 60 unit subcutaneously twice a day.  dispense qs x 72month 5)  Loratadine 10 Mg Tabs (Loratadine) .... Take 1 tablet by mouth once a day 6)  Synthroid 50 Mcg Tabs (Levothyroxine sodium) .Marland Kitchen.. 1 tab by mouth daily 7)  Zocor 40 Mg Tabs (Simvastatin) .... Take 1and 1/2  tablet by mouth once a day 8)  Novolog Flexpen 100 Unit/ml Soln (Insulin aspart) .... 35 units with breakfast and dinner, 15 units with lunch 9)  Ulticare Short Pen Needles 31g X 8 Mm Misc (Insulin pen needle) .... Use as directed.  dispense 1 box 10)  Celexa 20 Mg Tabs (Citalopram hydrobromide) .Marland Kitchen.. 1 tab by mouth daily 11)  Amlodipine Besylate 5 Mg Tabs (Amlodipine besylate) .... Take 1 tab by mouth daily for blood pressure 12)  Caltrate 600+d 600-400 Mg-unit Tabs (Calcium carbonate-vitamin d) .... 2 tabs by mouth daily 13)  Cyclobenzaprine Hcl 5 Mg Tabs (Cyclobenzaprine hcl) .Marland Kitchen.. 1-2 tabs by mouth three times a day as needed muscle spasm 14)  Fish Oil Oil (Fish oil) .... 1000mg  two times a day  purchases otc fish oil  Patient Instructions: 1)  It was nice to see you today. 2)  For your diabetes, lets keep your insulin the same.  3)  We will set you up for an x-ray.   4)  I will call you with your x-ray results. 5)  Please schedule a follow-up appointment in 3 months for diabetes, blood pressure, and cholesterol.   Laboratory Results   Blood Tests   Date/Time Received: January 14, 2010 11:51 AM  Date/Time Reported: January 14, 2010 11:21 AM   HGBA1C: 9.3%   (Normal Range:  Non-Diabetic - 3-6%   Control Diabetic - 6-8%)  Comments: ...........test performed by...........Marland KitchenHedy Camara, CMA      Prevention & Chronic Care Immunizations   Influenza vaccine: Fluvax MCR  (08/16/2008)   Influenza vaccine due: 08/16/2009    Tetanus booster: 09/18/2003: Done.   Tetanus booster due: 09/17/2013    Pneumococcal vaccine: Done.  (07/19/1995)   Pneumococcal vaccine due: None    H. zoster vaccine: Not documented  Colorectal Screening   Hemoccult: Done.  (09/18/1999)   Hemoccult due: Not Indicated    Colonoscopy: Done.  (02/15/2006)   Colonoscopy due: 02/16/2016  Other Screening   Pap smear: NEGATIVE FOR INTRAEPITHELIAL LESIONS OR MALIGNANCY.  (02/22/2009)   Pap smear due: 02/25/2012    Mammogram: ASSESSMENT: Negative - BI-RADS 1^MM DIGITAL SCREENING  (03/13/2009)   Mammogram action/deferral: Screening mammogram in 1 year.     (02/17/2007)   Mammogram due: 03/02/2009    DXA bone density scan: Done.  (08/17/1996)   DXA bone density action/deferral: Ordered  (07/20/2009)   DXA scan due: None    Smoking status: never  (01/14/2010)  Diabetes Mellitus   HgbA1C: 9.3  (01/14/2010)   Hemoglobin A1C due: 06/17/2008    Eye exam: punctate cataracts; no retinopathy  (08/15/2009)   Eye exam due: 08/2010    Foot exam: yes  (02/22/2009)   High risk foot: Not documented   Foot care education: Not documented   Foot exam due: 03/17/2009    Urine microalbumin/creatinine ratio: Not documented   Urine microalbumin/cr due: 03/30/2008    Diabetes flowsheet reviewed?: Yes   Progress toward A1C goal: Improved    Stage of readiness to change (diabetes management): Action  Lipids   Total Cholesterol: 173  (05/28/2009)   LDL: 86  (05/28/2009)   LDL Direct: 97  (03/17/2008)   HDL: 47  (05/28/2009)   Triglycerides: 202  (05/28/2009)    SGOT (AST): 13  (05/28/2009)   SGPT (ALT): 14  (05/28/2009)   Alkaline phosphatase: 68  (05/28/2009)  Total bilirubin: 0.5   (05/28/2009)    Lipid flowsheet reviewed?: Yes   Progress toward LDL goal: At goal  Hypertension   Last Blood Pressure: 136 / 80  (01/14/2010)   Serum creatinine: 0.76  (05/28/2009)   Serum potassium 4.2  (05/28/2009)    Hypertension flowsheet reviewed?: Yes   Progress toward BP goal: Improved    Stage of readiness to change (hypertension management): Action  Self-Management Support :   Personal Goals (by the next clinic visit) :     Personal A1C goal: 8  (07/20/2009)     Personal blood pressure goal: 130/80  (07/20/2009)     Personal LDL goal: 100  (07/20/2009)    Diabetes self-management support: Copy of home glucose meter record, CBG self-monitoring log, Written self-care plan  (01/14/2010)   Diabetes care plan printed    Hypertension self-management support: BP self-monitoring log, Written self-care plan  (01/14/2010)   Hypertension self-care plan printed.    Lipid self-management support: Written self-care plan  (07/20/2009)     Lipid self-management support not done because: Good outcomes  (11/20/2009)

## 2010-12-19 NOTE — Assessment & Plan Note (Signed)
Summary: Diabetes Follow - Up Rx Clinic   Vital Signs:  Patient profile:   70 year old female Height:      64 inches Weight:      206 pounds BMI:     35.49  Primary Care Provider:  Carin Hock MD   History of Present Illness:  70yo female seen for diabetes f/u.  Reports blood sugars have been "up and down" and presented a log of blood glucose readings since the last visit to confirm.  States she was up until 6am this morning caring for her son.   Reports significant stressors in her life, including increased involvement in caring or her handicapped son as well as her mother being hospitalized for heart conditions.  Pt's diet has been irregular and reports skipping meals sometimes due to these stressors and when she does eat, diet consists of toast and/or applesauce for breakfast, salad for lunch, and grilled chicken or fish for dinner.  Pt has recently discovered she is lactose intolerant.  Seems to be compliant with medications and reports exercising occasionally when walking her dog.    Current Medications (verified): 1)  Bayer Childrens Aspirin 81 Mg Chew (Aspirin) .... Take 1 Tablet By Mouth Once A Day 2)  Byetta 10 Mcg Pen 10 Mcg/0.20ml Soln (Exenatide) .... Inject 10 Mcg Subcutaneously Twice A Day 3)  Hyzaar 100-25 Mg Tabs (Losartan Potassium-Hctz) .... Take 1 Tablet By Mouth Once A Day 4)  Lantus 100 Unit/ml Soln (Insulin Glargine) .... Inject 70 Unit Subcutaneously Twice A Day.  Dispense Qs X 53month 5)  Loratadine 10 Mg Tabs (Loratadine) .... Take 1 Tablet By Mouth Once A Day 6)  Synthroid 50 Mcg Tabs (Levothyroxine Sodium) .Marland Kitchen.. 1 Tab By Mouth Daily 7)  Zocor 40 Mg Tabs (Simvastatin) .... Take 1and 1/2  Tablet By Mouth Once A Day 8)  Novolog Flexpen 100 Unit/ml  Soln (Insulin Aspart) .... 30 Units With Breakfast and Dinner, 10 Units With Lunch 9)  Ulticare Short Pen Needles 31g X 8 Mm  Misc (Insulin Pen Needle) .... Use As Directed.  Dispense 1 Box 10)  Celexa 20 Mg  Tabs  (Citalopram Hydrobromide) .Marland Kitchen.. 1 Tab By Mouth Daily 11)  Amlodipine Besylate 5 Mg Tabs (Amlodipine Besylate) .... Take 1 Tab By Mouth Daily For Blood Pressure 12)  Caltrate 600+d 600-400 Mg-Unit Tabs (Calcium Carbonate-Vitamin D) .... 2 Tabs By Mouth Daily 13)  Cyclobenzaprine Hcl 5 Mg Tabs (Cyclobenzaprine Hcl) .Marland Kitchen.. 1-2 Tabs By Mouth Three Times A Day As Needed Muscle Spasm 14)  Fish Oil   Oil (Fish Oil) .... 1000mg  Two Times A Day  Purchases Otc Fish Oil  Allergies: 1)  Avandia (Rosiglitazone Maleate) 2)  Ultracet (Tramadol-Acetaminophen) 3)  Morphine   Impression & Recommendations:  Problem # 1:  DIABETES MELLITUS II, UNCOMPLICATED (XX123456) Assessment Improved  Long standing diabetes currently under fair control of blood glucose based on fasting CBGs of 120-290s.  Control is suboptimal due to significant stressors in her life, including caring for handicapped son and hospitalized mother.  Pt reports 1 hypoglycemic event.  Able to verbalize appropriate hypoglycemia management plan.  Adjusted basal insulin Lantus to 60 units twice daily, before breakfast and dinner and adjusted Novolog to 35 units with breakfast and dinner and 15 units after lunch.  Written pt instructions provided and briefly discussed maintaining regular diet.  F/U with Dr. Oneal Grout. Please refer back to Rx clinic for follow up if CBGs remain consistently >200.   TTFFC: 35 mins.  Pt  seen with Margaretha Sheffield PharmD, Horace Porteous PharmD Candidate and [place] Student  Her updated medication list for this problem includes:    Bayer Childrens Aspirin 81 Mg Chew (Aspirin) .Marland Kitchen... Take 1 tablet by mouth once a day    Byetta 10 Mcg Pen 10 Mcg/0.47ml Soln (Exenatide) ..... Inject 10 mcg subcutaneously twice a day    Hyzaar 100-25 Mg Tabs (Losartan potassium-hctz) .Marland Kitchen... Take 1 tablet by mouth once a day    Lantus 100 Unit/ml Soln (Insulin glargine) ..... Inject 60 unit subcutaneously twice a day.  dispense qs x  15month    Novolog Flexpen 100 Unit/ml Soln (Insulin aspart) .Marland KitchenMarland KitchenMarland KitchenMarland Kitchen 35 units with breakfast and dinner, 15 units with lunch  Orders: Reassessment Each 15 min unit- [place] [id number])  Complete Medication List: 1)  Bayer Childrens Aspirin 81 Mg Chew (Aspirin) .... Take 1 tablet by mouth once a day 2)  Byetta 10 Mcg Pen 10 Mcg/0.18ml Soln (Exenatide) .... Inject 10 mcg subcutaneously twice a day 3)  Hyzaar 100-25 Mg Tabs (Losartan potassium-hctz) .... Take 1 tablet by mouth once a day 4)  Lantus 100 Unit/ml Soln (Insulin glargine) .... Inject 60 unit subcutaneously twice a day.  dispense qs x 82month 5)  Loratadine 10 Mg Tabs (Loratadine) .... Take 1 tablet by mouth once a day 6)  Synthroid 50 Mcg Tabs (Levothyroxine sodium) .Marland Kitchen.. 1 tab by mouth daily 7)  Zocor 40 Mg Tabs (Simvastatin) .... Take 1and 1/2  tablet by mouth once a day 8)  Novolog Flexpen 100 Unit/ml Soln (Insulin aspart) .... 35 units with breakfast and dinner, 15 units with lunch 9)  Ulticare Short Pen Needles 31g X 8 Mm Misc (Insulin pen needle) .... Use as directed.  dispense 1 box 10)  Celexa 20 Mg Tabs (Citalopram hydrobromide) .Marland Kitchen.. 1 tab by mouth daily 11)  Amlodipine Besylate 5 Mg Tabs (Amlodipine besylate) .... Take 1 tab by mouth daily for blood pressure 12)  Caltrate 600+d 600-400 Mg-unit Tabs (Calcium carbonate-vitamin d) .... 2 tabs by mouth daily 13)  Cyclobenzaprine Hcl 5 Mg Tabs (Cyclobenzaprine hcl) .Marland Kitchen.. 1-2 tabs by mouth three times a day as needed muscle spasm 14)  Fish Oil Oil (Fish oil) .... 1000mg  two times a day  purchases otc fish oil  Patient Instructions: 1)  Change Lantus to 60 units twice daily. 2)  Change Novolog to 35 units in the AM prior to meals, 15 units prior to lunch and 35 units prior to dinner.  Prescriptions: NOVOLOG FLEXPEN 100 UNIT/ML  SOLN (INSULIN ASPART) 35 units with breakfast and dinner, 15 units with lunch  #1 x 0   Entered and Authorized by:   Rinaldo Ratel D   Signed by:   Janeann Forehand  Pharm D on 11/20/2009   Method used:   Historical   [id number] LANTUS 100 UNIT/ML SOLN (INSULIN GLARGINE) Inject 60 unit subcutaneously twice a day.  Dispense QS x 3month  #1 x 5   Entered and Authorized by:   Rinaldo Ratel D   Signed by:   Janeann Forehand Pharm D on 11/20/2009   Method used:   Historical   [id number]   Prevention & Chronic Care Immunizations   Influenza vaccine: Fluvax MCR  (08/16/2008)   Influenza vaccine due: 08/16/2009    Tetanus booster: 09/18/2003: Done.   Tetanus booster due: 09/17/2013    Pneumococcal vaccine: Done.  (07/19/1995)   Pneumococcal vaccine due: None    H. zoster vaccine:  Not documented  Colorectal Screening   Hemoccult: Done.  (09/18/1999)   Hemoccult due: Not Indicated    Colonoscopy: Done.  (02/15/2006)   Colonoscopy due: 02/16/2016  Other Screening   Pap smear: NEGATIVE FOR INTRAEPITHELIAL LESIONS OR MALIGNANCY.  (02/22/2009)   Pap smear due: 08/18/1995    Mammogram: ASSESSMENT: Negative - BI-RADS 1^MM DIGITAL SCREENING  (03/13/2009)   Mammogram action/deferral: Screening mammogram in 1 year.     (02/17/2007)   Mammogram due: 03/02/2009    DXA bone density scan: Done.  (08/17/1996)   DXA bone density action/deferral: Ordered  (07/20/2009)   DXA scan due: None    Smoking status: never  (09/26/2009)  Diabetes Mellitus   HgbA1C: 10.3  (09/26/2009)   Hemoglobin A1C due: 06/17/2008    Eye exam: punctate cataracts; no retinopathy  (08/15/2009)   Eye exam due: 08/2010    Foot exam: yes  (02/22/2009)   High risk foot: Not documented   Foot care education: Not documented   Foot exam due: 03/17/2009    Urine microalbumin/creatinine ratio: Not documented   Urine microalbumin/cr due: 03/30/2008    Diabetes flowsheet reviewed?: Yes   Progress toward A1C goal: Improved  Lipids   Total Cholesterol: 173  (05/28/2009)   LDL: 86  (05/28/2009)   LDL Direct: 97  (03/17/2008)   HDL: 47  (05/28/2009)    Triglycerides: 202  (05/28/2009)    SGOT (AST): 13  (05/28/2009)   SGPT (ALT): 14  (05/28/2009)   Alkaline phosphatase: 68  (05/28/2009)   Total bilirubin: 0.5  (05/28/2009)    Lipid flowsheet reviewed?: Yes   Progress toward LDL goal: At goal  Hypertension   Last Blood Pressure: 172 / 85  (10/19/2009)   Serum creatinine: 0.76  (05/28/2009)   Serum potassium 4.2  (05/28/2009)    Hypertension flowsheet reviewed?: Yes   Progress toward BP goal: Unchanged  Self-Management Support :   Personal Goals (by the next clinic visit) :     Personal A1C goal: 8  (07/20/2009)     Personal blood pressure goal: 130/80  (07/20/2009)     Personal LDL goal: 100  (07/20/2009)    Diabetes self-management support: CBG self-monitoring log  (11/20/2009)    Hypertension self-management support: Written self-care plan  (07/20/2009)    Lipid self-management support: Written self-care plan  (07/20/2009)     Lipid self-management support not done because: Good outcomes  (11/20/2009)

## 2010-12-19 NOTE — Assessment & Plan Note (Signed)
Summary: f/up,DM, HTN   Vital Signs:  Patient profile:   70 year old female Height:      64 inches Weight:      213.5 pounds BMI:     36.78 Temp:     98.2 degrees F oral Pulse rate:   69 / minute BP sitting:   128 / 82  (left arm) Cuff size:   large  Vitals Entered By: Levert Feinstein LPN (September 16, 624THL 8:40 AM)  Serial Vital Signs/Assessments:  Time      Position  BP       Pulse  Resp  Temp     By                     128/82                         Luis Abed MD  CC: f/u dm Is Patient Diabetic? Yes Did you bring your meter with you today? No Pain Assessment Patient in pain? no        Primary Provider:  Luis Abed MD  CC:  f/u dm.  History of Present Illness: 1. DM: States that she has been taking her insulin much more regularly in the past few months. Sometimes skips lunch and doesn't take her lunch dose of novolog. Pre-breakfast BS 85-147, Post-breakfast 2hpp 64-70, and Post-dinner 2hPP 185-195. Eats salads for dinner, frequently skips meals.  Saw optometrist in June of this year who said she has cataracts, no retinopathy.  Sees podiatry regularly and no neuropathy or ulcers, just had toenail removed.   2. HTN: checks at home, usually around 110/60. She thinks it may be higher today because she was up with a stomach ache last night, and has to frequently get up to help turn her son who has Muscular dystrophy.   3. Family stress: Husband had open heart surgery several months ago, and is starting to recover. Grown son has muscular dystrophy and Lashanta takes care of all his needs including turning in bed and toileting. She also has an elderly mother she must assist. Does not have much time to exercise, but does walk her dog.   Preventive Screening-Counseling & Management  Alcohol-Tobacco     Smoking Status: never     Tobacco Counseling: to quit use of tobacco products  Allergies: 1)  Avandia (Rosiglitazone Maleate) 2)  Ultracet (Tramadol-Acetaminophen) 3)   Morphine  Past History:  Past Medical History: Last updated: 05/07/2010 benign multinodular goiter, self resolved  h pylori tx 7/98, 7/08 pneumonia-hosp 8/02,  Rt rotator cuff tear 3/04 s/p surgical repair DM HTN HLD TKA in 2000 multiple colon polyps (precancerous)--follows with Dr. Amedeo Plenty lactose intolerance implantable stimulator placed 7/09  Past Surgical History: Last updated: 10/05/2009 2D echo--LVEF 55-65%, mild increased LV wall thickness - 02/17/2005 Appendectomy bladder neck suspension 1988  Hysterectomy 1975 L axillary node resection LE venous dopplers neg - 02/17/2005 polypectomy (adenomatous) - 09/17/1997 r elbow surgery (lipoma) R knee meniscectomy Rt rotator cuff tear repair 03/28/03 thyroid FNA: benign - 05/18/1999 Tonsillectomy total knee replacement, right - 04/17/2000 Back surgery -  2008 Nerve stimulator for back - 2009  Family History: Last updated: 01/23/2009 mother and GM with breast cancer son with Muscular dystrophy GF CHF  no colon cancer  Social History: Last updated: 05/07/2010 Pt lives with her husband and 1 grown son in [place]. Younger son has muscular dystrophy.  No smoking,  No ETOH.  Having a  difficult time as primary caregiver to son who is not doing well.  Declined respite care/placement.  Risk Factors: Smoking Status: never (08/02/2010)  Review of Systems       The patient complains of peripheral edema and abdominal pain.  The patient denies anorexia, fever, weight loss, weight gain, chest pain, syncope, dyspnea on exertion, prolonged cough, melena, and hematochezia.    Physical Exam  General:  Well-developed,well-nourished,in no acute distress; alert,appropriate and cooperative throughout examination Head:  normocephalic and atraumatic.   Eyes:  No corneal or conjunctival inflammation noted. EOMI. Perrla. Funduscopic exam benign, without hemorrhages, exudates or papilledema. Vision grossly normal. Mouth:  Oral mucosa and oropharynx  without lesions or exudates.  Teeth in good repair. Neck:  No deformities, masses, or tenderness noted. Lungs:  Normal respiratory effort, chest expands symmetrically. Lungs are clear to auscultation, no crackles or wheezes. Heart:  Normal rate and regular rhythm. S1 and S2 normal without gallop, murmur, click, rub or other extra sounds. Abdomen:  soft and no distention.  Hyperactive BSs and some mild diffuse tenderness to palpation.  Extremities:  1+ bilateral pitting edema. Bilateral bronzy, skin thickening on dorsal aspect of feet and lower legs. No foot ulcers.  Neurologic:  No cranial nerve deficits noted. Station and gait are normal. Sensory, motor and coordinative functions appear intact.  Diabetes Management Exam:    Foot Exam (with socks and/or shoes not present):       Sensory-Pinprick/Light touch:          Left medial foot (L-4): normal          Left dorsal foot (L-5): normal          Left lateral foot (S-1): normal          Right medial foot (L-4): normal          Right dorsal foot (L-5): normal          Right lateral foot (S-1): normal       Sensory-Monofilament:          Left foot: normal          Right foot: normal       Inspection:          Left foot: normal          Right foot: normal       Nails:          Left foot: normal          Right foot: normal    Eye Exam:       Eye Exam done elsewhere          Date: 04/17/2010          Results: normal          Done by: Optometrist   Impression & Recommendations:  Problem # 1:  DIABETES MELLITUS II, UNCOMPLICATED (XX123456) A1C slightly elevated from last visit (9.2). States that she sometimes skips meals and insulin, encouraged to eat small meals throughout the day and to work on her exercise/weight loss. She agrees that walking and riding her stationary bike 10 minutes 4xweek is an attainable goal. We will attempt better diabetic control through these lifestyle changes, then f/u in clinic in one month. May need to  increase dinner time novolog as her dinner 2-hr PP values are the highest, or consider referral to pharm clinic.  Her updated medication list for this problem includes:    Bayer Childrens Aspirin 81 Mg Chew (Aspirin) .Marland Kitchen... Take 1 tablet  by mouth once a day    Byetta 10 Mcg Pen 10 Mcg/0.44ml Soln (Exenatide) ..... Inject 10 mcg subcutaneously twice a day    Hyzaar 100-25 Mg Tabs (Losartan potassium-hctz) .Marland Kitchen... Take 1 tablet by mouth once a day    Lantus 100 Unit/ml Soln (Insulin glargine) ..... Inject 60 unit subcutaneously twice a day.  dispense qs x 69month    Novolog Flexpen 100 Unit/ml Soln (Insulin aspart) .Marland KitchenMarland KitchenMarland KitchenMarland Kitchen 35 units with breakfast and dinner, 15 units with lunch  Orders: A1C-[facility] [id number]) Comp Met-[facility] [id number]) [place]- Est  Level 4 [id number])  Problem # 2:  HYPERTENSION, BENIGN SYSTEMIC (ICD-401.1) At goal with recheck by manual cuff. Will continue current management for now and have her check at home, informed patient of goal <130/80, and she usually gets SBP ~110. Checking CMP today. Refilled these meds.  Her updated medication list for this problem includes:    Hyzaar 100-25 Mg Tabs (Losartan potassium-hctz) .Marland Kitchen... Take 1 tablet by mouth once a day    Amlodipine Besylate 10 Mg Tabs (Amlodipine besylate) .Marland Kitchen... 1 tab by mouth daily for blood pressure  Orders: Comp Met-[facility] [id number]) [place]- Est  Level 4 [id number])  Problem # 3:  HYPERCHOLESTEROLEMIA (ICD-272.0) Due for FLP, was well controlled at last check. Continue current management and exercise as discussed above.    Her updated medication list for this problem includes:    Zocor 40 Mg Tabs (Simvastatin) .Marland Kitchen... Take 1and 1/2  tablet by mouth once a day  Orders: Lipid-[facility] [id number]) [place]- Est  Level 4 [id number])  Problem # 4:  HYPOTHYROIDISM, UNSPECIFIED (ICD-244.9) Check TSH today on chronic thyroid replacement.  Her updated medication list for this problem includes:    Synthroid 50 Mcg Tabs (Levothyroxine sodium) .Marland Kitchen... 1  tab by mouth daily  Orders: TSH-[facility] [id number]) [place]- Est  Level 4 [id number])  Problem # 5:  Preventive Health Care (ICD-V70.0) Last Colonoscopy was in June 2011. Pt states next due in 3 years (June 2014). Asked her to have GI clinic send [place] the reports.   Complete Medication List: 1)  Bayer Childrens Aspirin 81 Mg Chew (Aspirin) .... Take 1 tablet by mouth once a day 2)  Byetta 10 Mcg Pen 10 Mcg/0.24ml Soln (Exenatide) .... Inject 10 mcg subcutaneously twice a day 3)  Hyzaar 100-25 Mg Tabs (Losartan potassium-hctz) .... Take 1 tablet by mouth once a day 4)  Lantus 100 Unit/ml Soln (Insulin glargine) .... Inject 60 unit subcutaneously twice a day.  dispense qs x 82month 5)  Loratadine 10 Mg Tabs (Loratadine) .... Take 1 tablet by mouth once a day 6)  Synthroid 50 Mcg Tabs (Levothyroxine sodium) .Marland Kitchen.. 1 tab by mouth daily 7)  Zocor 40 Mg Tabs (Simvastatin) .... Take 1and 1/2  tablet by mouth once a day 8)  Novolog Flexpen 100 Unit/ml Soln (Insulin aspart) .... 35 units with breakfast and dinner, 15 units with lunch 9)  Ulticare Short Pen Needles 31g X 8 Mm Misc (Insulin pen needle) .... Use as directed.  dispense 1 box 10)  Celexa 20 Mg Tabs (Citalopram hydrobromide) .Marland Kitchen.. 1 tab by mouth daily 11)  Amlodipine Besylate 10 Mg Tabs (Amlodipine besylate) .Marland Kitchen.. 1 tab by mouth daily for blood pressure 12)  Caltrate 600+d 600-400 Mg-unit Tabs (Calcium carbonate-vitamin d) .... 2 tabs by mouth daily 13)  Fish Oil Oil (Fish oil) .... 1000mg  two times a day  purchases otc fish oil  Patient Instructions: 1)  Nice to meet you. 2)  Good job on your blood pressure  being at goal of less than 130/80.  3)  Try to increase your activity level by walking 15 minutes at least 3 times per week. This will help diabetes and blood pressure. 4)  I will call you if your labs are not normal. 5)  Please schedule an appointment with me in the next 1-2 months to follow up your diabetes.  Prescriptions: ZOCOR 40 MG TABS  (SIMVASTATIN) Take 1and 1/2  tablet by mouth once a day  #135 x 3   Entered and Authorized by:   Luis Abed MD   Signed by:   Luis Abed MD on 08/02/2010   Method used:   Faxed to ...       [facility] (mail-order)             , [place]         Ph: [phone]       Fax: [phone]   RxID:   [id number] HYZAAR 100-25 MG TABS (LOSARTAN POTASSIUM-HCTZ) Take 1 tablet by mouth once a day  #30 Tablet x 6   Entered and Authorized by:   Luis Abed MD   Signed by:   Luis Abed MD on 08/02/2010   Method used:   Faxed to ...       [facility] (mail-order)             , [place]         Ph: [phone]       Fax: [phone]   RxID:   [id number]   Laboratory Results   Blood Tests   Date/Time Received: August 02, 2010 8:37 AM  Date/Time Reported: August 02, 2010 8:58 AM   HGBA1C: 9.4%   (Normal Range: Non-Diabetic - 3-6%   Control Diabetic - 6-8%)  Comments: ...............test performed by.................Marland KitchenLevert Feinstein, LPN .............entered by...........Marland KitchenBonnie A. Martinique, MLS (ASCP)cm      Prevention & Chronic Care Immunizations   Influenza vaccine: Fluvax MCR  (08/16/2008)   Influenza vaccine due: 08/16/2009    Tetanus booster: 09/18/2003: Done.   Tetanus booster due: 09/17/2013    Pneumococcal vaccine: Done.  (07/19/1995)   Pneumococcal vaccine due: None    H. zoster vaccine: Not documented  Colorectal Screening   Hemoccult: Done.  (09/18/1999)   Hemoccult due: Not Indicated    Colonoscopy: Done.  (02/15/2006)   Colonoscopy due: 02/16/2016  Other Screening   Pap smear: NEGATIVE FOR INTRAEPITHELIAL LESIONS OR MALIGNANCY.  (02/22/2009)   Pap smear due: 02/25/2012    Mammogram: ASSESSMENT: Negative - BI-RADS 1^MM DIGITAL SCREENING  (04/25/2010)   Mammogram action/deferral: Screening mammogram in 1 year.     (02/17/2007)   Mammogram due: 04/2011    DXA bone density scan: Done.  (08/17/1996)   DXA bone density action/deferral: Ordered  (07/20/2009)   DXA  scan due: None   Reports requested:   Last colonoscopy report requested.  Smoking status: never  (08/02/2010)  Diabetes Mellitus   HgbA1C: 9.4  (08/02/2010)   Hemoglobin A1C due: 11/01/2010    Eye exam: normal  (04/17/2010)   Last eye exam report requested.   Eye exam due: 04/2011    Foot exam: yes  (08/02/2010)   High risk foot: Not documented   Foot care education: Not documented   Foot exam due: 08/03/2011    Urine microalbumin/creatinine ratio: Not documented   Urine microalbumin action/deferral: Not indicated   Urine microalbumin/cr due: 03/30/2008    Diabetes flowsheet reviewed?: Yes   Progress toward A1C goal: Unchanged  Lipids   Total  Cholesterol: 173  (05/28/2009)   Lipid panel action/deferral: Lipid Panel ordered   LDL: 86  (05/28/2009)   LDL Direct: 97  (03/17/2008)   HDL: 47  (05/28/2009)   Triglycerides: 202  (05/28/2009)   Lipid panel due: 08/03/2011    SGOT (AST): 13  (05/28/2009)   BMP action: Ordered   SGPT (ALT): 14  (05/28/2009) CMP ordered    Alkaline phosphatase: 68  (05/28/2009)   Total bilirubin: 0.5  (05/28/2009)   Liver panel due: 08/03/2011    Lipid flowsheet reviewed?: Yes   Progress toward LDL goal: Unchanged  Hypertension   Last Blood Pressure: 128 / 82  (08/02/2010)   Serum creatinine: 0.76  (05/28/2009)   Serum potassium 4.2  (05/28/2009) CMP ordered     Hypertension flowsheet reviewed?: Yes   Progress toward BP goal: Improved  Self-Management Support :   Personal Goals (by the next clinic visit) :     Personal A1C goal: 8  (07/20/2009)     Personal blood pressure goal: 130/80  (07/20/2009)     Personal LDL goal: 100  (07/20/2009)    Patient will work on the following items until the next clinic visit to reach self-care goals:     Medications and monitoring: take my medicines every day, check my blood sugar, check my blood pressure  (08/02/2010)     Activity: join a walking program  (08/02/2010)    Diabetes  self-management support: Copy of home glucose meter record, CBG self-monitoring log  (05/07/2010)    Diabetes self-management support not done because: Refused  (08/02/2010)    Hypertension self-management support: BP self-monitoring log, Written self-care plan  (05/07/2010)    Lipid self-management support: Written self-care plan  (07/20/2009)     Lipid self-management support not done because: Good outcomes  (11/20/2009)   Nursing Instructions: Request report of last colonoscopy Request report of last diabetic eye exam

## 2010-12-19 NOTE — Assessment & Plan Note (Signed)
Summary: behavioral medicine student consultation   Primary Care Provider:  Carin Hock MD   History of Present Illness: Patient spoke with the behavioral medicine student Jerrilyn Cairo) regarding management of her diabetes.  In the past few months, patient has experienced difficulty remembering to take her insulin before dinner.  She linked this to increased stress related to her husband's surgery.   Allergies: 1)  Avandia (Rosiglitazone Maleate) 2)  Ultracet (Tramadol-Acetaminophen) 3)  Morphine   Impression & Recommendations:  Problem # 1:  DIABETES MELLITUS II, UNCOMPLICATED (XX123456) Patient appears to be in the preparation stage of change.  She has made plans to create a chart to remind her when to take her insulin.  Her son previously created a medication chart for himself and can help her.  She said that she will place the chart on her refridgerator (because she takes her insulin before dinner) and will check off when she takes her insulin.  The student also suggested that she keep her insulin in the kitchen so that it is visible.  Patient could not forsee any additional barriers to creating and implementing this chart.  She said that she would have her son create it today.  She appeared to be very confident in her ability to change.  The student will plan to contact her in two weeks to see if using the chart has been effective.  Ethel Rana, [employer] [facility] Student 03/27/2010  Problem # 2:  DIABETES MELLITUS II, UNCOMPLICATED (XX123456)  Complete Medication List: 1)  Bayer Childrens Aspirin 81 Mg Chew (Aspirin) .... Take 1 tablet by mouth once a day 2)  Byetta 10 Mcg Pen 10 Mcg/0.65ml Soln (Exenatide) .... Inject 10 mcg subcutaneously twice a day 3)  Hyzaar 100-25 Mg Tabs (Losartan potassium-hctz) .... Take 1 tablet by mouth once a day 4)  Lantus 100 Unit/ml Soln (Insulin glargine) .... Inject 60 unit subcutaneously twice a day.  dispense qs x 37month 5)   Loratadine 10 Mg Tabs (Loratadine) .... Take 1 tablet by mouth once a day 6)  Synthroid 50 Mcg Tabs (Levothyroxine sodium) .Marland Kitchen.. 1 tab by mouth daily 7)  Zocor 40 Mg Tabs (Simvastatin) .... Take 1and 1/2  tablet by mouth once a day 8)  Novolog Flexpen 100 Unit/ml Soln (Insulin aspart) .... 35 units with breakfast and dinner, 15 units with lunch 9)  Ulticare Short Pen Needles 31g X 8 Mm Misc (Insulin pen needle) .... Use as directed.  dispense 1 box 10)  Celexa 20 Mg Tabs (Citalopram hydrobromide) .Marland Kitchen.. 1 tab by mouth daily 11)  Amlodipine Besylate 5 Mg Tabs (Amlodipine besylate) .... Take 1 tab by mouth daily for blood pressure 12)  Caltrate 600+d 600-400 Mg-unit Tabs (Calcium carbonate-vitamin d) .... 2 tabs by mouth daily 13)  Cyclobenzaprine Hcl 5 Mg Tabs (Cyclobenzaprine hcl) .Marland Kitchen.. 1-2 tabs by mouth three times a day as needed muscle spasm 14)  Fish Oil Oil (Fish oil) .... 1000mg  two times a day  purchases otc fish oil

## 2010-12-19 NOTE — Progress Notes (Signed)
Summary: triage  Phone Note Call from Patient Call back at Home Phone [phone]   Caller: Patient Summary of Call: sore on leg that is getting worse & also getting on other leg also Initial call taken by: Audie Clear,  August 20, 2010 10:22 AM  Follow-up for Phone Call        one leg has a sore, the other is red. wants to be seen today. placed with Dr. Zigmund Daniel at 2 as pcp is full & he had a cancellation at that time Follow-up by: Elige Radon RN,  August 20, 2010 10:25 AM

## 2010-12-19 NOTE — Consult Note (Signed)
Summary: Dr Sherlean Foot - Optometrist-No retinopathy  Dr Sherlean Foot - Optometrist   Imported By: Audie Clear 12/02/2010 13:57:24  _____________________________________________________________________  External Attachment:    Type:   Image     Comment:   External Document

## 2010-12-19 NOTE — Progress Notes (Signed)
Summary: pls call  Phone Note Call from Patient Call back at Home Phone [phone]   Summary of Call: left mssg with family member for pt to call me back about x-ray result Initial call taken by: Carin Hock MD,  January 14, 2010 1:41 PM  Follow-up for Phone Call        pt returned call Follow-up by: Audie Clear,  January 14, 2010 2:28 PM  Additional Follow-up for Phone Call Additional follow up Details #1::        gave x-ray results.  no fx.  so recommend RICE.  pt to call if not getting better. Additional Follow-up by: Carin Hock MD,  January 14, 2010 2:48 PM

## 2010-12-19 NOTE — Assessment & Plan Note (Signed)
Summary: f/u,df   Vital Signs:  Patient profile:   70 year old female Weight:      214.9 pounds Temp:     98.3 degrees F oral Pulse rate:   67 / minute Pulse rhythm:   regular BP sitting:   147 / 79  (left arm) Cuff size:   large  Vitals Entered By: Audelia Hives CMA (Mar 27, 2010 10:45 AM) CC: dm, htn, hld, swollen ankle Is Patient Diabetic? Yes Did you bring your meter with you today? No   Primary Care Provider:  Carin Hock MD  CC:  dm, htn, hld, and swollen ankle.  History of Present Illness: 1.  diabetes--A1C 9.2.  (was 9.3 last visit).  regimen is lantus 60 units two times a day and novolog 35 at breakfast and dinner and 15 at lunch.  Ms Sara Graves is still frequently forgetting lunch and dinner novolog and evening lantus.  has many responsibilities at home:  husband with recent bypass, son with muscular dystrophy who requires lots of custodial care, mother in her 59s who requires assistance.  lowest sugar in her log is 70.  am fasting sugars are mostly at goal.  some pre-lunch and pre-dinner sugars are in the high 100s and low 200s.    2.  hypertension--high today 147/79, but has not taken her bp meds this morning.  on hyzaar, norvasc  3.  hyperlipidemia--at goal on zocor 40  4.  swollen ankle--for many months.  had it x-rayed after last ov out of concern for fracture and soft tissue swelling and degenerative changes were only findings.  saw podiatrist, who thinks swelling due to arthritis.  he did some injections, but they did not help much.  no calf pain or SOB.  her ankle does not give her a lot of pain, but it does feel "tight:"  Habits & Providers  Alcohol-Tobacco-Diet     Tobacco Status: never  Allergies: 1)  Avandia (Rosiglitazone Maleate) 2)  Ultracet (Tramadol-Acetaminophen) 3)  Morphine  Physical Exam  General:  Well-developed,well-nourished,in no acute distress; alert,appropriate and cooperative throughout examination Lungs:  Normal respiratory effort,  chest expands symmetrically. Lungs are clear to auscultation, no crackles or wheezes. Heart:  Normal rate and regular rhythm. S1 and S2 normal without gallop, murmur, click, rub or other extra sounds. Extremities:  left ankle edematous, but not erythematous.  no ttp.   absent 2nd digit toenail.  no left calf swelling, calf tenderness, or palpable cords.    right calf measures about 1 cm greater than left calf.   Additional Exam:  vital signs reviewed    Impression & Recommendations:  Problem # 1:  ANKLE EDEMA (ICD-782.3) Assessment Unchanged probably is due to osteoarthritis, but think that it is prudent to rule out dvt.  if no dvt, try compression stocking. Her updated medication list for this problem includes:    Hyzaar 100-25 Mg Tabs (Losartan potassium-hctz) .Marland Kitchen... Take 1 tablet by mouth once a day  Orders: Ultrasound (Ultrasound) [place]- Est  Level 4 [id number])  Problem # 2:  DIABETES MELLITUS II, UNCOMPLICATED (XX123456) Assessment: Unchanged still think that inadequate control is due to missing doses of meds.  first step is compliance.  next step would be to adjust meds.  she met with [facility] psych student to discuss strategies for this.  also, she plans to make herself a med chart to keep on frig at home Her updated medication list for this problem includes:    Bayer Childrens Aspirin 81 Mg Chew (Aspirin) .Marland KitchenMarland KitchenMarland KitchenMarland Kitchen  Take 1 tablet by mouth once a day    Byetta 10 Mcg Pen 10 Mcg/0.72ml Soln (Exenatide) ..... Inject 10 mcg subcutaneously twice a day    Hyzaar 100-25 Mg Tabs (Losartan potassium-hctz) .Marland Kitchen... Take 1 tablet by mouth once a day    Lantus 100 Unit/ml Soln (Insulin glargine) ..... Inject 60 unit subcutaneously twice a day.  dispense qs x 75month    Novolog Flexpen 100 Unit/ml Soln (Insulin aspart) .Marland KitchenMarland KitchenMarland KitchenMarland Kitchen 35 units with breakfast and dinner, 15 units with lunch  Orders: A1C-[facility] [id number]) [place]- Est  Level 4 [id number])  Problem # 3:  HYPERTENSION, BENIGN SYSTEMIC (ICD-401.1) Assessment:  Deteriorated  again, needs to take meds Her updated medication list for this problem includes:    Hyzaar 100-25 Mg Tabs (Losartan potassium-hctz) .Marland Kitchen... Take 1 tablet by mouth once a day    Amlodipine Besylate 5 Mg Tabs (Amlodipine besylate) .Marland Kitchen... Take 1 tab by mouth daily for blood pressure  Orders: [place]- Est  Level 4 (99214)  Problem # 4:  HYPERCHOLESTEROLEMIA (ICD-272.0) Assessment: Unchanged  at goal Her updated medication list for this problem includes:    Zocor 40 Mg Tabs (Simvastatin) .Marland Kitchen... Take 1and 1/2  tablet by mouth once a day  Orders: [facility]- Est  Level 4 [id number])  Complete Medication List: 1)  Bayer Childrens Aspirin 81 Mg Chew (Aspirin) .... Take 1 tablet by mouth once a day 2)  Byetta 10 Mcg Pen 10 Mcg/0.26ml Soln (Exenatide) .... Inject 10 mcg subcutaneously twice a day 3)  Hyzaar 100-25 Mg Tabs (Losartan potassium-hctz) .... Take 1 tablet by mouth once a day 4)  Lantus 100 Unit/ml Soln (Insulin glargine) .... Inject 60 unit subcutaneously twice a day.  dispense qs x 43month 5)  Loratadine 10 Mg Tabs (Loratadine) .... Take 1 tablet by mouth once a day 6)  Synthroid 50 Mcg Tabs (Levothyroxine sodium) .Marland Kitchen.. 1 tab by mouth daily 7)  Zocor 40 Mg Tabs (Simvastatin) .... Take 1and 1/2  tablet by mouth once a day 8)  Novolog Flexpen 100 Unit/ml Soln (Insulin aspart) .... 35 units with breakfast and dinner, 15 units with lunch 9)  Ulticare Short Pen Needles 31g X 8 Mm Misc (Insulin pen needle) .... Use as directed.  dispense 1 box 10)  Celexa 20 Mg Tabs (Citalopram hydrobromide) .Marland Kitchen.. 1 tab by mouth daily 11)  Amlodipine Besylate 5 Mg Tabs (Amlodipine besylate) .... Take 1 tab by mouth daily for blood pressure 12)  Caltrate 600+d 600-400 Mg-unit Tabs (Calcium carbonate-vitamin d) .... 2 tabs by mouth daily 13)  Cyclobenzaprine Hcl 5 Mg Tabs (Cyclobenzaprine hcl) .Marland Kitchen.. 1-2 tabs by mouth three times a day as needed muscle spasm 14)  Fish Oil Oil (Fish oil) .... 1000mg  two times a day   purchases otc fish oil  Patient Instructions: 1)  It was nice to see you today. 2)  Try using a chart to track your insulin doses. 3)  Check a few sugars about an hour after meals. 4)  Remember to get your mammogram. 5)  We will set you up for an ultrasound of your leg. 6)  Please schedule a follow-up appointment around the middle-end of June.  Laboratory Results   Blood Tests   Date/Time Received: Mar 27, 2010 10:36 AM  Date/Time Reported: Mar 27, 2010 10:55 AM   HGBA1C: 9.2%   (Normal Range: Non-Diabetic - 3-6%   Control Diabetic - 6-8%)  Comments: ...............test performed by......Marland KitchenBonnie A. Martinique, MLS (ASCP)cm      Prevention & Chronic Care  Immunizations   Influenza vaccine: Fluvax MCR  (08/16/2008)   Influenza vaccine due: 08/16/2009    Tetanus booster: 09/18/2003: Done.   Tetanus booster due: 09/17/2013    Pneumococcal vaccine: Done.  (07/19/1995)   Pneumococcal vaccine due: None    H. zoster vaccine: Not documented  Colorectal Screening   Hemoccult: Done.  (09/18/1999)   Hemoccult due: Not Indicated    Colonoscopy: Done.  (02/15/2006)   Colonoscopy due: 02/16/2016  Other Screening   Pap smear: NEGATIVE FOR INTRAEPITHELIAL LESIONS OR MALIGNANCY.  (02/22/2009)   Pap smear due: 02/25/2012    Mammogram: ASSESSMENT: Negative - BI-RADS 1^MM DIGITAL SCREENING  (03/13/2009)   Mammogram action/deferral: Screening mammogram in 1 year.     (02/17/2007)   Mammogram due: 03/02/2009    DXA bone density scan: Done.  (08/17/1996)   DXA bone density action/deferral: Ordered  (07/20/2009)   DXA scan due: None    Smoking status: never  (03/27/2010)  Diabetes Mellitus   HgbA1C: 9.2  (03/27/2010)   Hemoglobin A1C due: 06/17/2008    Eye exam: punctate cataracts; no retinopathy  (08/15/2009)   Eye exam due: 08/2010    Foot exam: yes  (02/22/2009)   High risk foot: Not documented   Foot care education: Not documented   Foot exam due: 03/17/2009    Urine  microalbumin/creatinine ratio: Not documented   Urine microalbumin/cr due: 03/30/2008    Diabetes flowsheet reviewed?: Yes   Progress toward A1C goal: Unchanged    Stage of readiness to change (diabetes management): Preparation  Lipids   Total Cholesterol: 173  (05/28/2009)   LDL: 86  (05/28/2009)   LDL Direct: 97  (03/17/2008)   HDL: 47  (05/28/2009)   Triglycerides: 202  (05/28/2009)    SGOT (AST): 13  (05/28/2009)   SGPT (ALT): 14  (05/28/2009)   Alkaline phosphatase: 68  (05/28/2009)   Total bilirubin: 0.5  (05/28/2009)    Lipid flowsheet reviewed?: Yes   Progress toward LDL goal: At goal  Hypertension   Last Blood Pressure: 147 / 79  (03/27/2010)   Serum creatinine: 0.76  (05/28/2009)   Serum potassium 4.2  (05/28/2009)    Hypertension flowsheet reviewed?: Yes   Progress toward BP goal: Deteriorated    Stage of readiness to change (hypertension management): Preparation  Self-Management Support :   Personal Goals (by the next clinic visit) :     Personal A1C goal: 8  (07/20/2009)     Personal blood pressure goal: 130/80  (07/20/2009)     Personal LDL goal: 100  (07/20/2009)    Diabetes self-management support: Copy of home glucose meter record, CBG self-monitoring log, Written self-care plan  (03/27/2010)   Diabetes care plan printed    Hypertension self-management support: BP self-monitoring log, Written self-care plan, Education handout  (03/27/2010)   Hypertension self-care plan printed.   Hypertension education handout printed    Lipid self-management support: Written self-care plan  (07/20/2009)     Lipid self-management support not done because: Good outcomes  (11/20/2009)

## 2010-12-19 NOTE — Assessment & Plan Note (Signed)
Summary: DM, f/u  kh   Vital Signs:  Patient profile:   70 year old female Weight:      213 pounds Temp:     98 degrees F oral Pulse rate:   82 / minute Pulse rhythm:   regular BP sitting:   171 / 84  (left arm) Cuff size:   large  Vitals Entered By: Audelia Hives CMA (September 20, 2010 2:51 PM) CC: follow-up visit   Primary Provider:  Luis Abed MD  CC:  follow-up visit.  History of Present Illness: 1. HTN: Missed morning dose of hyzaar today because she had to rush to take her 70 yo mother to doctor. Ate [place] food last evening, woke up with headache and some mild abdominal pain now resolved. Does not check BP at home consistently.   2. DM:  Brings her FSBS log which shows recent fasting values of 60-100, breakfast 2 hr pp 110-130, and dinner 2hr pp 150-170. States that she is "doing better" with insulin regimen consisting of Byetta two times a day, Lantus 60 two times a day, and novolog 35 at bf, 20 at lunch, 35 at dinnertime. Complains of some mild tingling in toes. A1c is 9.4 today.   3. Venous stasis: Seen in clinic recently for epidermal changes due to chronic venous stasis and LE edema. Has been using compression hose at home (not on today in clinic) and elevating legs when she can. States that the hose have helped some.   Allergies: 1)  Avandia (Rosiglitazone Maleate) 2)  Ultracet (Tramadol-Acetaminophen) 3)  Morphine  Past History:  Past Medical History: Last updated: 05/07/2010 benign multinodular goiter, self resolved  h pylori tx 7/98, 7/08 pneumonia-hosp 8/02,  Rt rotator cuff tear 3/04 s/p surgical repair DM HTN HLD TKA in 2000 multiple colon polyps (precancerous)--follows with Dr. Amedeo Plenty lactose intolerance implantable stimulator placed 7/09  Past Surgical History: Last updated: 10/05/2009 2D echo--LVEF 55-65%, mild increased LV wall thickness - 02/17/2005 Appendectomy bladder neck suspension 1988  Hysterectomy 1975 L axillary node resection LE  venous dopplers neg - 02/17/2005 polypectomy (adenomatous) - 09/17/1997 r elbow surgery (lipoma) R knee meniscectomy Rt rotator cuff tear repair 03/28/03 thyroid FNA: benign - 05/18/1999 Tonsillectomy total knee replacement, right - 04/17/2000 Back surgery -  2008 Nerve stimulator for back - 2009  Family History: Last updated: 01/23/2009 mother and GM with breast cancer son with Muscular dystrophy GF CHF  no colon cancer  Social History: Last updated: 05/07/2010 Pt lives with her husband and 1 grown son in [place]. Younger son has muscular dystrophy.  No smoking,  No ETOH.  Having a difficult time as primary caregiver to son who is not doing well.  Declined respite care/placement.  Risk Factors: Smoking Status: never (08/28/2010)  Review of Systems       The patient complains of peripheral edema.  The patient denies anorexia, fever, weight loss, weight gain, chest pain, syncope, dyspnea on exertion, prolonged cough, headaches, abdominal pain, difficulty walking, and unusual weight change.    Physical Exam  General:  alert, well-developed, well-nourished, well-hydrated, and overweight-appearing.   Head:  normocephalic, atraumatic, and no abnormalities observed.   Eyes:  PERRLA, EOMI Mouth:  Oral mucosa and oropharynx without lesions or exudates.  Teeth in good repair. Neck:  supple.   Lungs:  Normal respiratory effort, chest expands symmetrically. Lungs are clear to auscultation, no crackles or wheezes. Heart:  Normal rate and regular rhythm. S1 and S2 normal without gallop, murmur, click, rub or  other extra sounds. Extremities:  1-2+ bilateral pitting edema to upper calf level. Bronzy epidermal discoloaration on dorsums of feet bilaterally. No skin breakdown.  Neurologic:  alert & oriented X3, cranial nerves II-XII intact, strength normal in all extremities, and sensation intact to light touch.    Diabetes Management Exam:    Foot Exam (with socks and/or shoes not present):        Sensory-Pinprick/Light touch:          Left medial foot (L-4): normal          Left dorsal foot (L-5): normal          Left lateral foot (S-1): normal          Right medial foot (L-4): normal          Right dorsal foot (L-5): normal          Right lateral foot (S-1): normal       Sensory-Monofilament:          Left foot: normal          Right foot: normal       Inspection:          Left foot: normal          Right foot: normal       Nails:          Left foot: thickened          Right foot: thickened   Impression & Recommendations:  Problem # 1:  HYPERTENSION, BENIGN SYSTEMIC (ICD-401.1) Assessment Deteriorated Identifiable reason for hypertension today. Encouraged to take her medication as soon as she returns home. We discussed weight loss (has been stable  ~213 for past 6 months) and exercise being an important method to acheive this. Patient agrees that walking 15 minutes per day is an achievable goal.   Her updated medication list for this problem includes:    Hyzaar 100-25 Mg Tabs (Losartan potassium-hctz) .Marland Kitchen... Take 1 tablet by mouth once a day    Amlodipine Besylate 10 Mg Tabs (Amlodipine besylate) .Marland Kitchen... 1 tab by mouth daily for blood pressure  Orders: [place]- Est  Level 4 (99214)  Problem # 2:  DIABETES MELLITUS II, UNCOMPLICATED (XX123456) Assessment: Deteriorated Uncontolled. On intensive anti-glycemic and insulin regimen with multiple daily injections. Since her recorded FSBS values do not correspond to an A1c in the 9 range, I suspect noncompliance with her stated regimen. Will refer to Dr. Valentina Lucks in [place] clinic who has helped patient in the past. Given a food diary and instructed to continue recording 2 hr postprandial glucose values.   Her updated medication list for this problem includes:    Bayer Childrens Aspirin 81 Mg Chew (Aspirin) .Marland Kitchen... Take 1 tablet by mouth once a day    Byetta 10 Mcg Pen 10 Mcg/0.54ml Soln (Exenatide) ..... Inject 10 mcg subcutaneously twice  a day    Hyzaar 100-25 Mg Tabs (Losartan potassium-hctz) .Marland Kitchen... Take 1 tablet by mouth once a day    Lantus 100 Unit/ml Soln (Insulin glargine) ..... Inject 60 unit subcutaneously twice a day.  dispense qs x 46month    Novolog Flexpen 100 Unit/ml Soln (Insulin aspart) .Marland KitchenMarland KitchenMarland KitchenMarland Kitchen 35 units with breakfast and dinner, 15 units with lunch  Orders: [place]- Est  Level 4 (99214)  Problem # 3:  RASH AND OTHER NONSPECIFIC SKIN ERUPTION (ICD-782.1) Assessment: Unchanged Believe this to be related to chronic venous insufficiency as evidenced by dependent LE edema and bronzy skin changes. Continue compression hose and elevation  of feet when possible. Discussed weight loss as being an important method to improve this condition.  Her updated medication list for this problem includes:    Triamcinolone Acetonide 0.1 % Crea (Triamcinolone acetonide) .Marland Kitchen... Apply to affected areas on legs twice daily.  dispense on large tube  Orders: [place]- Est  Level 4 (99214)  Problem # 4:  HYPERCHOLESTEROLEMIA (ICD-272.0) Assessment: Unchanged Continue managment. At goal.  Her updated medication list for this problem includes:    Zocor 40 Mg Tabs (Simvastatin) .Marland Kitchen... Take 1and 1/2  tablet by mouth once a day  Complete Medication List: 1)  Bayer Childrens Aspirin 81 Mg Chew (Aspirin) .... Take 1 tablet by mouth once a day 2)  Byetta 10 Mcg Pen 10 Mcg/0.19ml Soln (Exenatide) .... Inject 10 mcg subcutaneously twice a day 3)  Hyzaar 100-25 Mg Tabs (Losartan potassium-hctz) .... Take 1 tablet by mouth once a day 4)  Lantus 100 Unit/ml Soln (Insulin glargine) .... Inject 60 unit subcutaneously twice a day.  dispense qs x 35month 5)  Loratadine 10 Mg Tabs (Loratadine) .... Take 1 tablet by mouth once a day 6)  Synthroid 50 Mcg Tabs (Levothyroxine sodium) .Marland Kitchen.. 1 tab by mouth daily 7)  Zocor 40 Mg Tabs (Simvastatin) .... Take 1and 1/2  tablet by mouth once a day 8)  Novolog Flexpen 100 Unit/ml Soln (Insulin aspart) .... 35 units with  breakfast and dinner, 15 units with lunch 9)  Ulticare Short Pen Needles 31g X 8 Mm Misc (Insulin pen needle) .... Use as directed.  dispense 1 box 10)  Celexa 20 Mg Tabs (Citalopram hydrobromide) .Marland Kitchen.. 1 tab by mouth daily 11)  Amlodipine Besylate 10 Mg Tabs (Amlodipine besylate) .Marland Kitchen.. 1 tab by mouth daily for blood pressure 12)  Caltrate 600+d 600-400 Mg-unit Tabs (Calcium carbonate-vitamin d) .... 2 tabs by mouth daily 13)  Fish Oil Oil (Fish oil) .... 1000mg  two times a day  purchases otc fish oil 14)  Triamcinolone Acetonide 0.1 % Crea (Triamcinolone acetonide) .... Apply to affected areas on legs twice daily.  dispense on large tube  Patient Instructions: 1)  Nice to see you again. 2)  Please schedule an appointment in pharmacy clinic with Dr. Valentina Lucks for insulin management. 3)  For the next 3 days, please right down everything you eat and take your blood sugar 2 hours after each meal.  4)  Try to exercise for 15 minutes per day everyday. 5)  Weight loss will help your leg swelling, continue using compression hose.   Orders Added: 1)  [facility]- Est  Level 4 [id number]

## 2010-12-19 NOTE — Assessment & Plan Note (Signed)
Summary: F/U  [place]   Vital Signs:  Patient profile:   70 year old female Height:      64 inches Temp:     98.4 degrees F oral Pulse rate:   79 / minute BP sitting:   160 / 92  (left arm)  Vitals Entered By: Schuyler Amor CMA (December 03, 2010 3:40 PM) CC: F/U Is Patient Diabetic? Yes Pain Assessment Patient in pain? yes     Location: lower back Intensity: 7   Primary Provider:  Luis Abed MD  CC:  F/U.  History of Present Illness: 1. Dysuria. Patient having burning sensation and frequency for past few days. This is associated with a mild lower back pain, L>R. Denies current fever, n/v, hematuria. Not sexually active for many years. No abnormal pelvic bleeding or discharge.   2. DM. Past 2 weeks has had fluxuating CBGs, some higher and some lower than normal. 70s-170s.  Had some nausea/vomitting and decreased appetite 2 weeks ago that is now resolved. She did not take any insulin for one week due to this decrease in her food intake. Now that symptoms resolved, she is taking Lantus 55 u two times a day and Novolog 40 units with her meals. Her fasting BS was 110 this am. Brings food log which reveals 3 days of intake. Eats breakfast of wheat bread, PB, applesauce in small portions, apple/banana as a snack, [place] burger for lunch. No significant sweets/cola intake. Drinks water. Dinner includes salad with veggies and baked fish. Had some nausea on one day, and recorded only 6 saltines as a meal.   3. Leg swelling. Has been a chronic problem, but improved significantly today. This corresponds to the discontinuation of her norvasc. Still has some mild burning in her ankles.   4. HTN. Taking her hyzaar as directed, but absence of norvasc has driven up her BP today. Beta blockers were used in the past, but stopped for bradycardia.   Allergies: 1)  Avandia (Rosiglitazone Maleate) 2)  Ultracet (Tramadol-Acetaminophen) 3)  Morphine  Past History:  Past Medical History: Last updated:  05/07/2010 benign multinodular goiter, self resolved  h pylori tx 7/98, 7/08 pneumonia-hosp 8/02,  Rt rotator cuff tear 3/04 s/p surgical repair DM HTN HLD TKA in 2000 multiple colon polyps (precancerous)--follows with Dr. Amedeo Plenty lactose intolerance implantable stimulator placed 7/09  Past Surgical History: Last updated: 10/05/2009 2D echo--LVEF 55-65%, mild increased LV wall thickness - 02/17/2005 Appendectomy bladder neck suspension 1988  Hysterectomy 1975 L axillary node resection LE venous dopplers neg - 02/17/2005 polypectomy (adenomatous) - 09/17/1997 r elbow surgery (lipoma) R knee meniscectomy Rt rotator cuff tear repair 03/28/03 thyroid FNA: benign - 05/18/1999 Tonsillectomy total knee replacement, right - 04/17/2000 Back surgery -  2008 Nerve stimulator for back - 2009  Family History: Last updated: 01/23/2009 mother and GM with breast cancer son with Muscular dystrophy GF CHF  no colon cancer  Social History: Last updated: 05/07/2010 Pt lives with her husband and 1 grown son in [place]. Younger son has muscular dystrophy.  No smoking,  No ETOH.  Having a difficult time as primary caregiver to son who is not doing well.  Declined respite care/placement.  Risk Factors: Smoking Status: never (08/28/2010)  Review of Systems  The patient denies anorexia, fever, chest pain, syncope, dyspnea on exertion, abdominal pain, hematuria, incontinence, and genital sores.    Physical Exam  General:  Well-developed,well-nourished,in no acute distress; alert,appropriate and cooperative throughout examination Head:  normocephalic and atraumatic.   Eyes:  PERRLA, EOMI Lungs:  Normal respiratory effort, chest expands symmetrically. Lungs are clear to auscultation, no crackles or wheezes. Heart:  Normal rate and regular rhythm. S1 and S2 normal without gallop, murmur, click, rub or other extra sounds. Abdomen:  Bowel sounds positive,abdomen soft and non-tender without masses,  organomegaly or hernias noted. Msk:  No deformity or scoliosis noted of thoracic or lumbar spine.   Pulses:  R and L dorsalis pedis pulses are full and equal bilaterally Extremities:  trace bilateral LE pitting edema to ankles.  Neurologic:  alert & oriented X3 and strength normal in all extremities.   Skin:  Intact without suspicious lesions or rashes Psych:  Cognition and judgment appear intact. Alert and cooperative with normal attention span and concentration. No apparent delusions, illusions, hallucinations  Diabetes Management Exam:    Foot Exam (with socks and/or shoes not present):       Sensory-Pinprick/Light touch:          Left medial foot (L-4): normal          Left dorsal foot (L-5): normal          Left lateral foot (S-1): normal          Right medial foot (L-4): normal          Right dorsal foot (L-5): normal          Right lateral foot (S-1): normal       Sensory-Monofilament:          Left foot: normal          Right foot: normal       Inspection:          Left foot: normal          Right foot: normal       Nails:          Left foot: normal          Right foot: normal   Impression & Recommendations:  Problem # 1:  DIABETES MELLITUS II, UNCOMPLICATED ([id number]) A1c is increased almost 2 points from previous. I suspect this to be due to her not taking any basal or bolus insulin for about one week during her period of decreased by mouth intake this month. Also have been titrating down her lantus. Given her fasting levels being controlled currently, and her relative low by mouth intake (documented on food log), will not increase insulin dosing. Will need to f/u A1c in next 2 months to assess control. Recommend she continue her basal insulin even when she is sick (only hold the novolog when meal is skipped).  Her updated medication list for this problem includes:    Bayer Childrens Aspirin 81 Mg Chew (Aspirin) .Marland Kitchen... Take 1 tablet by mouth once a day    Byetta 10 Mcg  Pen 10 Mcg/0.67ml Soln (Exenatide) ..... Inject 10 mcg subcutaneously twice a day    Hyzaar 100-25 Mg Tabs (Losartan potassium-hctz) .Marland Kitchen... Take 1 tablet by mouth once a day    Lantus 100 Unit/ml Soln (Insulin glargine) ..... Inject 55 unit subcutaneously twice a day.  dispense qs x 79month    Novolog Flexpen 100 Unit/ml Soln (Insulin aspart) .Marland KitchenMarland KitchenMarland KitchenMarland Kitchen 40 units with breakfast and dinner, 20 units with lunch  Orders: A1C-[facility] [id number]) [place]- Est  Level 4 [id number])  Problem # 2:  DYSURIA (ICD-788.1) Symptoms concerning for UTI in this diabetic patient. Will treat empirically with keflex. F/u culture results. Patient advised to return to care if symptoms continue. Deferred pelvic  exam due to lack of subjective evidence, but will be necessary if this does not resolve.  Her updated medication list for this problem includes:    Keflex 500 Mg Caps (Cephalexin) .Marland Kitchen... Take one tab by mouth two times a day for 5 days  Orders: Urinalysis-[facility] (00000) Urine Culture-[facility] [id number]) [place]- Est  Level 4 [id number])  Problem # 3:  HYPERTENSION, BENIGN SYSTEMIC (ICD-401.1) Started clonidine for uncontrolled HTN. WIll a void norvasc and BB if possible due to significant LE edema and history of iatrogenic bradycardia in the past. Asked patient to record BP at home and write down these measurements.   Her updated medication list for this problem includes:    Hyzaar 100-25 Mg Tabs (Losartan potassium-hctz) .Marland Kitchen... Take 1 tablet by mouth once a day    Clonidine Hcl 0.1 Mg Tabs (Clonidine hcl) .Marland Kitchen... Take one tab by mouth bid  Orders: [facility]- Est  Level 4 [id number])  Problem # 4:  UNSPECIFIED VENOUS INSUFFICIENCY (ICD-459.81) Improved, this gives evidence to being related to norvasc. Will follow LE swelling.   Complete Medication List: 1)  Bayer Childrens Aspirin 81 Mg Chew (Aspirin) .... Take 1 tablet by mouth once a day 2)  Byetta 10 Mcg Pen 10 Mcg/0.31ml Soln (Exenatide) .... Inject 10 mcg subcutaneously twice a day 3)  Hyzaar  100-25 Mg Tabs (Losartan potassium-hctz) .... Take 1 tablet by mouth once a day 4)  Lantus 100 Unit/ml Soln (Insulin glargine) .... Inject 55 unit subcutaneously twice a day.  dispense qs x 48month 5)  Loratadine 10 Mg Tabs (Loratadine) .... Take 1 tablet by mouth once a day 6)  Synthroid 50 Mcg Tabs (Levothyroxine sodium) .Marland Kitchen.. 1 tab by mouth daily 7)  Novolog Flexpen 100 Unit/ml Soln (Insulin aspart) .... 40 units with breakfast and dinner, 20 units with lunch 8)  Ulticare Short Pen Needles 31g X 8 Mm Misc (Insulin pen needle) .... Use as directed.  dispense 1 box 9)  Celexa 20 Mg Tabs (Citalopram hydrobromide) .Marland Kitchen.. 1 tab by mouth daily 10)  Caltrate 600+d 600-400 Mg-unit Tabs (Calcium carbonate-vitamin d) .Marland Kitchen.. 1 twice daily 11)  Fish Oil Oil (Fish oil) .... 1000mg  two times a day  purchases otc fish oil 12)  Triamcinolone Acetonide 0.1 % Crea (Triamcinolone acetonide) .... Apply to affected areas on legs twice daily.  dispense on large tube 13)  Lipitor 20 Mg Tabs (Atorvastatin calcium) .... Take one tab at bedtime 14)  Keflex 500 Mg Caps (Cephalexin) .... Take one tab by mouth two times a day for 5 days 15)  Clonidine Hcl 0.1 Mg Tabs (Clonidine hcl) .... Take one tab by mouth bid  Patient Instructions: 1)  Nice to see you again. 2)  Start taking Clonidine 0.1mg  twice daily. 3)  Stop taking amlodipine (norvasc). 4)  Continue taking your insulin with Lantus 55 units twice daily and Novolog. 5)  Call with questions. Prescriptions: CLONIDINE HCL 0.1 MG TABS (CLONIDINE HCL) take one tab by mouth bid  #60 x 3   Entered and Authorized by:   Luis Abed MD   Signed by:   Luis Abed MD on 12/03/2010   Method used:   Electronically to        [facility] [phone]* (retail)       [address]       [place], [place]  [address]       Ph: [phone]       Fax: [phone]  [id number] KEFLEX 500 MG CAPS (CEPHALEXIN) Take one tab by mouth two times a day  for 5 days  #10 x 0   Entered and Authorized by:   Luis Abed MD   Signed by:   Luis Abed MD on 12/03/2010   Method used:   Electronically to        [facility] [phone]* (retail)       [address]       [place], [place]  [address]       Ph: [phone]       Fax: [phone]   RxID:   [id number] BYETTA 10 MCG PEN 10 MCG/0.04ML SOLN (EXENATIDE) Inject 10 mcg subcutaneously twice a day  #1 x 12   Entered and Authorized by:   Luis Abed MD   Signed by:   Luis Abed MD on 12/03/2010   Method used:   Electronically to        [facility] (retail)             ,          Ph: [phone]       Fax: [phone]   [id number] LIPITOR 20 MG TABS (ATORVASTATIN CALCIUM) take one tab at bedtime  #90 x 3   Entered and Authorized by:   Luis Abed MD   Signed by:   Luis Abed MD on 12/03/2010   Method used:   Electronically to        [facility] (retail)             ,          Ph: [phone]       Fax: [phone]   [id number] NOVOLOG FLEXPEN 100 UNIT/ML  SOLN (INSULIN ASPART) 40 units with breakfast and dinner, 20 units with lunch  #1 x 5   Entered and Authorized by:   Luis Abed MD   Signed by:   Luis Abed MD on 12/03/2010   Method used:   Electronically to        [facility] (retail)             ,          Ph: [phone]       Fax: [phone]   [id number] LANTUS 100 UNIT/ML SOLN (INSULIN GLARGINE) Inject 55 unit subcutaneously twice a day.  Dispense QS x 64month  #1 x 5   Entered and Authorized by:   Luis Abed MD   Signed by:   Luis Abed MD on 12/03/2010   Method used:   Electronically to        [facility] (retail)             ,          Ph: [phone]       Fax: [phone]   [id number] HYZAAR 100-25 MG TABS (LOSARTAN POTASSIUM-HCTZ) Take 1 tablet by mouth once a day  #90 x 3   Entered and Authorized by:   Luis Abed MD   Signed by:   Luis Abed MD on  12/03/2010   Method used:   Electronically to        [facility] (retail)             ,          Ph: [phone]  Fax: [phone]   RxID:   [id number]    Orders Added: 1)  A1C-[facility] [83036] 2)  Urinalysis-[facility] [00000] 3)  Urine Culture-[facility] [id number] 4)  [facility]- Est  Level 4 [id number]    Laboratory Results   Urine Tests  Date/Time Received: December 03, 2010 2:30 PM  Date/Time Reported: December 03, 2010 2:56 PM   Routine Urinalysis   Color: yellow Appearance: Clear Glucose: 500   (Normal Range: Negative) Bilirubin: negative   (Normal Range: Negative) Ketone: negative   (Normal Range: Negative) Spec. Gravity: 1.020   (Normal Range: 1.003-1.035) Blood: negative   (Normal Range: Negative) pH: 5.0   (Normal Range: 5.0-8.0) Protein: negative   (Normal Range: Negative) Urobilinogen: 0.2   (Normal Range: 0-1) Nitrite: negative   (Normal Range: Negative) Leukocyte Esterace: negative   (Normal Range: Negative)    Comments: .........Marland Kitchenbiochemical negative; microscopic not indicated ...............test performed by......Marland KitchenBonnie A. Martinique, MLS (ASCP)cm   Blood Tests   Date/Time Received: December 03, 2010 2:12 PM  Date/Time Reported: December 03, 2010 2:27 PM   HGBA1C: 11.1%   (Normal Range: Non-Diabetic - 3-6%   Control Diabetic - 6-8%)  Comments: ...............test performed by......Marland KitchenBonnie A. Martinique, MLS (ASCP)cm

## 2010-12-19 NOTE — Assessment & Plan Note (Signed)
Summary: RECHECK LEGS/KONKOL NOT AVAIL/KH   Vital Signs:  Patient profile:   70 year old female Weight:      216.8 pounds Temp:     98.1 degrees F oral Pulse rate:   84 / minute Pulse rhythm:   regular BP sitting:   142 / 72  (right arm) Cuff size:   large  Vitals Entered By: Audelia Hives CMA (August 28, 2010 11:46 AM) CC: recheck leg   Primary Care Nakul Avino:  Luis Abed MD  CC:  recheck leg.  History of Present Illness: f/u "rash" on legs. Almost resolved. Got some support hose and this is helping a lot. Still a little itching at times and the cream seems to help  Habits & Providers  Alcohol-Tobacco-Diet     Tobacco Status: never     Tobacco Counseling: to quit use of tobacco products  Current Medications (verified): 1)  Bayer Childrens Aspirin 81 Mg Chew (Aspirin) .... Take 1 Tablet By Mouth Once A Day 2)  Byetta 10 Mcg Pen 10 Mcg/0.41ml Soln (Exenatide) .... Inject 10 Mcg Subcutaneously Twice A Day 3)  Hyzaar 100-25 Mg Tabs (Losartan Potassium-Hctz) .... Take 1 Tablet By Mouth Once A Day 4)  Lantus 100 Unit/ml Soln (Insulin Glargine) .... Inject 60 Unit Subcutaneously Twice A Day.  Dispense Qs X 71month 5)  Loratadine 10 Mg Tabs (Loratadine) .... Take 1 Tablet By Mouth Once A Day 6)  Synthroid 50 Mcg Tabs (Levothyroxine Sodium) .Marland Kitchen.. 1 Tab By Mouth Daily 7)  Zocor 40 Mg Tabs (Simvastatin) .... Take 1and 1/2  Tablet By Mouth Once A Day 8)  Novolog Flexpen 100 Unit/ml  Soln (Insulin Aspart) .... 35 Units With Breakfast and Dinner, 15 Units With Lunch 9)  Ulticare Short Pen Needles 31g X 8 Mm  Misc (Insulin Pen Needle) .... Use As Directed.  Dispense 1 Box 10)  Celexa 20 Mg  Tabs (Citalopram Hydrobromide) .Marland Kitchen.. 1 Tab By Mouth Daily 11)  Amlodipine Besylate 10 Mg Tabs (Amlodipine Besylate) .Marland Kitchen.. 1 Tab By Mouth Daily For Blood Pressure 12)  Caltrate 600+d 600-400 Mg-Unit Tabs (Calcium Carbonate-Vitamin D) .... 2 Tabs By Mouth Daily 13)  Fish Oil   Oil (Fish Oil) .... 1000mg   Two Times A Day  Purchases Otc Fish Oil 14)  Triamcinolone Acetonide 0.1 % Crea (Triamcinolone Acetonide) .... Apply To Affected Areas On Legs Twice Daily.  Dispense On Large Tube  Allergies: 1)  Avandia (Rosiglitazone Maleate) 2)  Ultracet (Tramadol-Acetaminophen) 3)  Morphine  Physical Exam  Extremities:  B LE no pitting edema. healing lesions right shin area (2 mm in diameter, no drainage)    Impression & Recommendations:  Problem # 1:  RASH AND OTHER NONSPECIFIC SKIN ERUPTION (ICD-782.1)  Her updated medication list for this problem includes:    Triamcinolone Acetonide 0.1 % Crea (Triamcinolone acetonide) .Marland Kitchen... Apply to affected areas on legs twice daily.  dispense on large tube  Orders: [place]- Est Level  3 [id number]) I believe this was venous stasis problem and we discussed--agree with support hose  Complete Medication List: 1)  Bayer Childrens Aspirin 81 Mg Chew (Aspirin) .... Take 1 tablet by mouth once a day 2)  Byetta 10 Mcg Pen 10 Mcg/0.43ml Soln (Exenatide) .... Inject 10 mcg subcutaneously twice a day 3)  Hyzaar 100-25 Mg Tabs (Losartan potassium-hctz) .... Take 1 tablet by mouth once a day 4)  Lantus 100 Unit/ml Soln (Insulin glargine) .... Inject 60 unit subcutaneously twice a day.  dispense qs x 48month 5)  Loratadine 10 Mg Tabs (Loratadine) .... Take 1 tablet by mouth once a day 6)  Synthroid 50 Mcg Tabs (Levothyroxine sodium) .Marland Kitchen.. 1 tab by mouth daily 7)  Zocor 40 Mg Tabs (Simvastatin) .... Take 1and 1/2  tablet by mouth once a day 8)  Novolog Flexpen 100 Unit/ml Soln (Insulin aspart) .... 35 units with breakfast and dinner, 15 units with lunch 9)  Ulticare Short Pen Needles 31g X 8 Mm Misc (Insulin pen needle) .... Use as directed.  dispense 1 box 10)  Celexa 20 Mg Tabs (Citalopram hydrobromide) .Marland Kitchen.. 1 tab by mouth daily 11)  Amlodipine Besylate 10 Mg Tabs (Amlodipine besylate) .Marland Kitchen.. 1 tab by mouth daily for blood pressure 12)  Caltrate 600+d 600-400 Mg-unit Tabs  (Calcium carbonate-vitamin d) .... 2 tabs by mouth daily 13)  Fish Oil Oil (Fish oil) .... 1000mg  two times a day  purchases otc fish oil 14)  Triamcinolone Acetonide 0.1 % Crea (Triamcinolone acetonide) .... Apply to affected areas on legs twice daily.  dispense on large tube

## 2010-12-19 NOTE — Assessment & Plan Note (Signed)
Summary: patient summary/dm, htn   Vital Signs:  Patient profile:   70 year old female Height:      64 inches Weight:      213.5 pounds BMI:     36.78 Temp:     98.1 degrees F oral Pulse rate:   77 / minute BP sitting:   142 / 80  (left arm) Cuff size:   large  Vitals Entered By: Isabelle Course (May 07, 2010 10:11 AM) CC: F/U  DM, htn Is Patient Diabetic? Yes Did you bring your meter with you today? No Pain Assessment Patient in pain? no        Primary Care Provider:  Carin Hock MD  CC:  F/U  DM and htn.  History of Present Illness: 1.  f/u diabetes.  Pt has been to pharmacy clinic.  insulin adjusted.  last A1C 5.2 on 5/11.  She met with behavioral med student to discuss methods for remembering her insulin, which seems to be the major impediment to controlling her diabetes.  sugars are as follows   pre-breakfast:  60-100. post prandial breakfast:  108-165 post lunch:  190-199 (only 2 mearsurements) pre-dinner:  75-105 post dinner:  70-170  only lows are a  4 measurements:  3 at 60 (1 pre-breakfast and 2 pre-lunch) and one 65 pre-breakfast.  2.  hypertension--above goal today and last visit.  on hyzaar and amlodipine  Habits & Providers  Alcohol-Tobacco-Diet     Tobacco Status: never     Tobacco Counseling: to quit use of tobacco products  Current Medications (verified): 1)  Bayer Childrens Aspirin 81 Mg Chew (Aspirin) .... Take 1 Tablet By Mouth Once A Day 2)  Byetta 10 Mcg Pen 10 Mcg/0.109ml Soln (Exenatide) .... Inject 10 Mcg Subcutaneously Twice A Day 3)  Hyzaar 100-25 Mg Tabs (Losartan Potassium-Hctz) .... Take 1 Tablet By Mouth Once A Day 4)  Lantus 100 Unit/ml Soln (Insulin Glargine) .... Inject 60 Unit Subcutaneously Twice A Day.  Dispense Qs X 81month 5)  Loratadine 10 Mg Tabs (Loratadine) .... Take 1 Tablet By Mouth Once A Day 6)  Synthroid 50 Mcg Tabs (Levothyroxine Sodium) .Marland Kitchen.. 1 Tab By Mouth Daily 7)  Zocor 40 Mg Tabs (Simvastatin) .... Take 1and  1/2  Tablet By Mouth Once A Day 8)  Novolog Flexpen 100 Unit/ml  Soln (Insulin Aspart) .... 35 Units With Breakfast and Dinner, 15 Units With Lunch 9)  Ulticare Short Pen Needles 31g X 8 Mm  Misc (Insulin Pen Needle) .... Use As Directed.  Dispense 1 Box 10)  Celexa 20 Mg  Tabs (Citalopram Hydrobromide) .Marland Kitchen.. 1 Tab By Mouth Daily 11)  Amlodipine Besylate 5 Mg Tabs (Amlodipine Besylate) .... Take 1 Tab By Mouth Daily For Blood Pressure 12)  Caltrate 600+d 600-400 Mg-Unit Tabs (Calcium Carbonate-Vitamin D) .... 2 Tabs By Mouth Daily 13)  Cyclobenzaprine Hcl 5 Mg Tabs (Cyclobenzaprine Hcl) .Marland Kitchen.. 1-2 Tabs By Mouth Three Times A Day As Needed Muscle Spasm 14)  Fish Oil   Oil (Fish Oil) .... 1000mg  Two Times A Day  Purchases Otc Fish Oil  Allergies: 1)  Avandia (Rosiglitazone Maleate) 2)  Ultracet (Tramadol-Acetaminophen) 3)  Morphine  Past History:  Past Medical History: benign multinodular goiter, self resolved  h pylori tx 7/98, 7/08 pneumonia-hosp 8/02,  Rt rotator cuff tear 3/04 s/p surgical repair DM HTN HLD TKA in 2000 multiple colon polyps (precancerous)--follows with Dr. Amedeo Plenty lactose intolerance implantable stimulator placed 7/09  Family History: Reviewed history from 01/23/2009 and no  changes required. mother and GM with breast cancer son with Muscular dystrophy GF CHF  no colon cancer  Social History: Reviewed history from 01/23/2009 and no changes required. Pt lives with her husband and 1 grown son in [place]. Younger son has muscular dystrophy.  No smoking,  No ETOH.  Having a difficult time as primary caregiver to son who is not doing well.  Declined respite care/placement.  Review of Systems       The patient complains of weight gain.  The patient denies fever, chest pain, and dyspnea on exertion.    Physical Exam  General:  Well-developed,well-nourished,in no acute distress; alert,appropriate and cooperative throughout examination Additional Exam:  vital signs  reviewed    Impression & Recommendations:  Problem # 1:  DIABETES MELLITUS II, UNCOMPLICATED ([id number]) Assessment Improved  improved.  no med changes at this time.  check A1C in 2 months.  Ifstill >7, adjust insulin Her updated medication list for this problem includes:    Bayer Childrens Aspirin 81 Mg Chew (Aspirin) .Marland Kitchen... Take 1 tablet by mouth once a day    Byetta 10 Mcg Pen 10 Mcg/0.17ml Soln (Exenatide) ..... Inject 10 mcg subcutaneously twice a day    Hyzaar 100-25 Mg Tabs (Losartan potassium-hctz) .Marland Kitchen... Take 1 tablet by mouth once a day    Lantus 100 Unit/ml Soln (Insulin glargine) ..... Inject 60 unit subcutaneously twice a day.  dispense qs x 68month    Novolog Flexpen 100 Unit/ml Soln (Insulin aspart) .Marland KitchenMarland KitchenMarland KitchenMarland Kitchen 35 units with breakfast and dinner, 15 units with lunch  Labs Reviewed: Creat: 0.76 (05/28/2009)   Microalbumin: trace (03/31/2007)  Last Eye Exam: punctate cataracts; no retinopathy (08/15/2009) Reviewed HgBA1c results: 9.2 (03/27/2010)  9.3 (01/14/2010)  Problem # 2:  HYPERCHOLESTEROLEMIA (ICD-272.0) Assessment: Unchanged  at goal.  due for FLP in July 2011 Her updated medication list for this problem includes:    Zocor 40 Mg Tabs (Simvastatin) .Marland Kitchen... Take 1and 1/2  tablet by mouth once a day  Labs Reviewed: SGOT: 13 (05/28/2009)   SGPT: 14 (05/28/2009)   HDL:47 (05/28/2009), 36 (06/29/2007)  LDL:86 (05/28/2009), 57 (06/29/2007)  Chol:173 (05/28/2009), 156 (06/29/2007)  Trig:202 (05/28/2009), 316 (06/29/2007)  Problem # 3:  HYPERTENSION, BENIGN SYSTEMIC (ICD-401.1) Assessment: Unchanged  still above goal.  increase amlodipine to 10 mg Her updated medication list for this problem includes:    Hyzaar 100-25 Mg Tabs (Losartan potassium-hctz) .Marland Kitchen... Take 1 tablet by mouth once a day    Amlodipine Besylate 10 Mg Tabs (Amlodipine besylate) .Marland Kitchen... 1 tab by mouth daily for blood pressure  BP today: 142/80 Prior BP: 147/79 (03/27/2010)  Labs Reviewed: K+: 4.2  (05/28/2009) Creat: : 0.76 (05/28/2009)   Chol: 173 (05/28/2009)   HDL: 47 (05/28/2009)   LDL: 86 (05/28/2009)   TG: 202 (05/28/2009)  Orders: [place]- Est Level  3 [id number])  Problem # 4:  LACTOSE INTOLERANCE (ICD-271.3) Assessment: Comment Only  Problem # 5:  DISORDER, DEPRESSIVE NEC (ICD-311) Assessment: Unchanged doing well on celexa Her updated medication list for this problem includes:    Celexa 20 Mg Tabs (Citalopram hydrobromide) .Marland Kitchen... 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 Take 1 tablet by mouth once a day    Cyclobenzaprine Hcl 5 Mg Tabs (Cyclobenzaprine hcl) .Marland Kitchen... 1-2 tabs by mouth three times a day as needed muscle spasm  Problem # 7:  HYPOTHYROIDISM, UNSPECIFIED (ICD-244.9) due for tsh in November 2011 Her updated medication list for this problem includes:    Synthroid 50 Mcg Tabs (Levothyroxine sodium) .Marland Kitchen... 1 tab by mouth daily  Labs Reviewed: TSH: 1.906 (10/05/2009)    HgBA1c: 9.2 (03/27/2010) Chol: 173 (05/28/2009)   HDL: 47 (05/28/2009)   LDL: 86 (05/28/2009)   TG: 202 (05/28/2009)  Complete Medication List: 1)  Bayer Childrens Aspirin 81 Mg Chew (Aspirin) .... Take 1 tablet by mouth once a day 2)  Byetta 10 Mcg Pen 10 Mcg/0.63ml Soln (Exenatide) .... Inject 10 mcg subcutaneously twice a day 3)  Hyzaar 100-25 Mg Tabs (Losartan potassium-hctz) .... Take 1 tablet by mouth once a day 4)  Lantus 100 Unit/ml Soln (Insulin glargine) .... Inject 60 unit subcutaneously twice a day.  dispense qs x 21month 5)  Loratadine 10 Mg Tabs (Loratadine) .... Take 1 tablet by mouth once a day 6)  Synthroid 50 Mcg Tabs (Levothyroxine sodium) .Marland Kitchen.. 1 tab by mouth daily 7)  Zocor 40 Mg Tabs (Simvastatin) .... Take 1and 1/2  tablet by mouth once a day 8)  Novolog Flexpen 100 Unit/ml Soln (Insulin aspart) .... 35 units with  breakfast and dinner, 15 units with lunch 9)  Ulticare Short Pen Needles 31g X 8 Mm Misc (Insulin pen needle) .... Use as directed.  dispense 1 box 10)  Celexa 20 Mg Tabs (Citalopram hydrobromide) .Marland Kitchen.. 1 tab by mouth daily 11)  Amlodipine Besylate 10 Mg Tabs (Amlodipine besylate) .Marland Kitchen.. 1 tab by mouth daily for blood pressure 12)  Caltrate 600+d 600-400 Mg-unit Tabs (Calcium carbonate-vitamin d) .... 2 tabs by mouth daily 13)  Cyclobenzaprine Hcl 5 Mg Tabs (Cyclobenzaprine hcl) .Marland Kitchen.. 1-2 tabs by mouth three times a day as needed muscle spasm 14)  Fish Oil Oil (Fish oil) .... 1000mg  two times a day  purchases otc fish oil  Patient Instructions: 1)  It was nice to see you today. 2)  Your sugars look great.  Keep up the good work!! 3)  Start the new dose of amlodipine (norvasc) for your blood pressure. 4)  Please schedule a follow-up appointment in late July or early August (morning appointment with labs). Prescriptions: AMLODIPINE BESYLATE 10 MG TABS (AMLODIPINE BESYLATE) 1 tab by mouth daily for blood pressure  #90 x 3   Entered and Authorized by:   Carin Hock MD   Signed by:   Carin Hock MD on 05/07/2010   Method used:   Print then Give to Patient   RxID:   [id number]   Prevention & Chronic Care Immunizations   Influenza vaccine: Fluvax MCR  (08/16/2008)   Influenza vaccine due: 08/16/2009    Tetanus booster: 09/18/2003: Done.   Tetanus booster due: 09/17/2013    Pneumococcal vaccine: Done.  (07/19/1995)   Pneumococcal vaccine due: None    H. zoster vaccine: Not documented  Colorectal Screening   Hemoccult: Done.  (09/18/1999)   Hemoccult due: Not Indicated    Colonoscopy: Done.  (02/15/2006)   Colonoscopy due: 02/16/2016  Other Screening   Pap smear: NEGATIVE FOR INTRAEPITHELIAL LESIONS OR MALIGNANCY.  (02/22/2009)   Pap smear due: 02/25/2012    Mammogram: ASSESSMENT: Negative - BI-RADS 1^MM DIGITAL SCREENING  (04/25/2010)   Mammogram action/deferral:  Screening mammogram in 1 year.     (02/17/2007)   Mammogram due: 04/2011    DXA bone density scan: Done.  (08/17/1996)   DXA bone density action/deferral: Ordered  (07/20/2009)   DXA scan due: None    Smoking status: never  (  05/07/2010)  Diabetes Mellitus   HgbA1C: 9.2  (03/27/2010)   Hemoglobin A1C due: 06/17/2008    Eye exam: punctate cataracts; no retinopathy  (08/15/2009)   Eye exam due: 08/2010    Foot exam: yes  (02/22/2009)   High risk foot: Not documented   Foot care education: Not documented   Foot exam due: 03/17/2009    Urine microalbumin/creatinine ratio: Not documented   Urine microalbumin/cr due: 03/30/2008    Diabetes flowsheet reviewed?: Yes   Progress toward A1C goal: Unchanged    Stage of readiness to change (diabetes management): Action  Lipids   Total Cholesterol: 173  (05/28/2009)   LDL: 86  (05/28/2009)   LDL Direct: 97  (03/17/2008)   HDL: 47  (05/28/2009)   Triglycerides: 202  (05/28/2009)    SGOT (AST): 13  (05/28/2009)   SGPT (ALT): 14  (05/28/2009)   Alkaline phosphatase: 68  (05/28/2009)   Total bilirubin: 0.5  (05/28/2009)    Lipid flowsheet reviewed?: Yes   Progress toward LDL goal: At goal  Hypertension   Last Blood Pressure: 142 / 80  (05/07/2010)   Serum creatinine: 0.76  (05/28/2009)   Serum potassium 4.2  (05/28/2009)    Hypertension flowsheet reviewed?: Yes   Progress toward BP goal: Unchanged    Stage of readiness to change (hypertension management): Action  Self-Management Support :   Personal Goals (by the next clinic visit) :     Personal A1C goal: 8  (07/20/2009)     Personal blood pressure goal: 130/80  (07/20/2009)     Personal LDL goal: 100  (07/20/2009)    Diabetes self-management support: Copy of home glucose meter record, CBG self-monitoring log  (05/07/2010)    Hypertension self-management support: BP self-monitoring log, Written self-care plan  (05/07/2010)   Hypertension self-care plan printed.     Lipid self-management support: Written self-care plan  (07/20/2009)     Lipid self-management support not done because: Good outcomes  (11/20/2009)

## 2010-12-19 NOTE — Assessment & Plan Note (Signed)
Summary: DM management - Rx Clinic   Vital Signs:  Patient profile:   70 year old female Height:      64 inches Weight:      210.3 pounds BMI:     36.23 Pulse rate:   76 / minute BP sitting:   134 / 92  (left arm)  Primary Care Provider:  Luis Abed MD   History of Present Illness: Patient presents in good spirits, but she has had increased nausea in the last couple of weeks.  Patient is currently responsible for taking care of son with muscular dystrophy and 47 yearold mother with dementia.  She believes the increased stress has caused her to eat less led to her weight loss of 3 pounds since last visit.  Patient's BG log reveals low fasting sugars (60-70 range) and low PP dinner sugars (110-150).  Patient's last A1c was 9.4 in September and stated that her sugars in the middle of the day are usually higher typically less than 180 but one reading up to 400.  She is currently using Lantus 80 units two times a day and Novolog 35 units at breakfast and dinner and 15 units at lunch.  Patient is also complaining of edema in her lower extremities.    Current Medications (verified): 1)  Bayer Childrens Aspirin 81 Mg Chew (Aspirin) .... Take 1 Tablet By Mouth Once A Day 2)  Byetta 10 Mcg Pen 10 Mcg/0.94ml Soln (Exenatide) .... Inject 10 Mcg Subcutaneously Twice A Day 3)  Hyzaar 100-25 Mg Tabs (Losartan Potassium-Hctz) .... Take 1 Tablet By Mouth Once A Day 4)  Lantus 100 Unit/ml Soln (Insulin Glargine) .... Inject 80 Unit Subcutaneously Twice A Day.  Dispense Qs X 59month 5)  Loratadine 10 Mg Tabs (Loratadine) .... Take 1 Tablet By Mouth Once A Day 6)  Synthroid 50 Mcg Tabs (Levothyroxine Sodium) .Marland Kitchen.. 1 Tab By Mouth Daily 7)  Zocor 40 Mg Tabs (Simvastatin) .... Take 1and 1/2  Tablet By Mouth Once A Day 8)  Novolog Flexpen 100 Unit/ml  Soln (Insulin Aspart) .... 35 Units With Breakfast and Dinner, 15 Units With Lunch 9)  Ulticare Short Pen Needles 31g X 8 Mm  Misc (Insulin Pen Needle) .... Use As  Directed.  Dispense 1 Box 10)  Celexa 20 Mg  Tabs (Citalopram Hydrobromide) .Marland Kitchen.. 1 Tab By Mouth Daily 11)  Amlodipine Besylate 10 Mg Tabs (Amlodipine Besylate) .Marland Kitchen.. 1 Tab By Mouth Daily For Blood Pressure 12)  Caltrate 600+d 600-400 Mg-Unit Tabs (Calcium Carbonate-Vitamin D) .... 2 Tabs By Mouth Daily 13)  Fish Oil   Oil (Fish Oil) .... 1000mg  Two Times A Day  Purchases Otc Fish Oil 14)  Triamcinolone Acetonide 0.1 % Crea (Triamcinolone Acetonide) .... Apply To Affected Areas On Legs Twice Daily.  Dispense On Large Tube  Allergies (verified): 1)  Avandia (Rosiglitazone Maleate) 2)  Ultracet (Tramadol-Acetaminophen) 3)  Morphine   Impression & Recommendations:  Problem # 1:  DIABETES MELLITUS II, UNCOMPLICATED ([id number]) Assessment Unchanged  Patient is uncontrolled on current insulin regimen.  Due to low blood sugars in the morning, we need to reduce Lantus to 60 units bid.  Patient's mealtime insulin level needs to be adjusted in order to even out Lantus and Novolog doses.  We increased the Novolog to 40 units at breakfast and dinner and 20 units at lunch.  Consider changing Byetta to Victoza to reduce nausea if symptom persists.  Recommended checking sugars more than twice daily and preferably 2 hours after eating.  Measure A1c at next visit.  30 minutes spent with patient.  Seen by Ernst Bowler, PharmD candidate and Ella Jubilee, PharmD resident.   Her updated medication list for this problem includes:    Bayer Childrens Aspirin 81 Mg Chew (Aspirin) .Marland Kitchen... Take 1 tablet by mouth once a day    Byetta 10 Mcg Pen 10 Mcg/0.76ml Soln (Exenatide) ..... Inject 10 mcg subcutaneously twice a day    Hyzaar 100-25 Mg Tabs (Losartan potassium-hctz) .Marland Kitchen... Take 1 tablet by mouth once a day    Lantus 100 Unit/ml Soln (Insulin glargine) ..... Inject 60 unit subcutaneously twice a day.  dispense qs x 59month    Novolog Flexpen 100 Unit/ml Soln (Insulin aspart) .Marland KitchenMarland KitchenMarland KitchenMarland Kitchen 40 units with breakfast and dinner, 20  units with lunch  Orders: Reassessment Each 15 min unit- [place] [id number])  Problem # 2:  HYPERTENSION, BENIGN SYSTEMIC (ICD-401.1) Assessment: Unchanged  Patient near goal of BP 130/80 on three agent regimen.  However, peripheral edema may be related to her amlodipine.  Will attempt to dose reduce amlodipine and reassess edema and BP in one month.  May consider another BP agent to replace amlodipine at next visit.  Her updated medication list for this problem includes:    Hyzaar 100-25 Mg Tabs (Losartan potassium-hctz) .Marland Kitchen... Take 1 tablet by mouth once a day    Amlodipine Besylate 10 Mg Tabs (Amlodipine besylate) .Marland Kitchen... 1/2  tab by mouth daily for blood pressure  Orders: Reassessment Each 15 min unit- [place] [id number])  Problem # 3:  HYPERCHOLESTEROLEMIA (ICD-272.0) Assessment: Unchanged  Near goal for LDL at last assessment.  May need to change to more potent statin at next LDL check.  Suggest atorvastatin 20mg  as option for this patient.  Her updated medication list for this problem includes:    Zocor 40 Mg Tabs (Simvastatin) .Marland Kitchen... Take 1and 1/2  tablet by mouth once a day  Orders: Reassessment Each 15 min unit- [place] [id number])  Problem # 4:  UNSPECIFIED VENOUS INSUFFICIENCY (ICD-459.81) Assessment: Unchanged  Possibly related to amlodipine.  Will decrease dose today and reasssess at next visit.  patient has been using stockings without significant help.   Orders: Reassessment Each 15 min unit- [place] [phone])  Complete Medication List: 1)  Bayer Childrens Aspirin 81 Mg Chew (Aspirin) .... Take 1 tablet by mouth once a day 2)  Byetta 10 Mcg Pen 10 Mcg/0.76ml Soln (Exenatide) .... Inject 10 mcg subcutaneously twice a day 3)  Hyzaar 100-25 Mg Tabs (Losartan potassium-hctz) .... Take 1 tablet by mouth once a day 4)  Lantus 100 Unit/ml Soln (Insulin glargine) .... Inject 60 unit subcutaneously twice a day.  dispense qs x 15month 5)  Loratadine 10 Mg Tabs (Loratadine) .... Take 1 tablet by mouth once  a day 6)  Synthroid 50 Mcg Tabs (Levothyroxine sodium) .Marland Kitchen.. 1 tab by mouth daily 7)  Zocor 40 Mg Tabs (Simvastatin) .... Take 1and 1/2  tablet by mouth once a day 8)  Novolog Flexpen 100 Unit/ml Soln (Insulin aspart) .... 40 units with breakfast and dinner, 20 units with lunch 9)  Ulticare Short Pen Needles 31g X 8 Mm Misc (Insulin pen needle) .... Use as directed.  dispense 1 box 10)  Celexa 20 Mg Tabs (Citalopram hydrobromide) .Marland Kitchen.. 1 tab by mouth daily 11)  Amlodipine Besylate 10 Mg Tabs (Amlodipine besylate) .... 1/2  tab by mouth daily for blood pressure 12)  Caltrate 600+d 600-400 Mg-unit Tabs (Calcium carbonate-vitamin d) .Marland Kitchen.. 1 twice daily 13)  Fish Oil Oil (Fish  oil) .... 1000mg  two times a day  purchases otc fish oil 14)  Triamcinolone Acetonide 0.1 % Crea (Triamcinolone acetonide) .... Apply to affected areas on legs twice daily.  dispense on large tube  Patient Instructions: 1)  Decrease your LANTUS to 60 units twice daily.  2)  Increase your Novolog to 40 units prior to breakfast, 20 units prior, 40 units prior to dinner.  3)  Take your Byetta immediately prior to your meals (twice daily).  4)  Check your blood sugar during the day and record your numbers in your log book.  5)  Take Calcium Viatmin D combination pill to twice daily 6)  Decrease your Amlodipine (Norvasc) to 1/2 tablet daily.  7)  Return to Rx Clinic in 4 weeks.   Prescriptions: AMLODIPINE BESYLATE 10 MG TABS (AMLODIPINE BESYLATE) 1/2  tab by mouth daily for blood pressure  #1 x 0   Entered by:   Ella Jubilee PharmD   Authorized by:   Janeann Forehand Pharm D   Signed by:   Janeann Forehand Pharm D on 10/08/2010   Method used:   Historical   [id number] CALTRATE 600+D 600-400 MG-UNIT TABS (CALCIUM CARBONATE-VITAMIN D) 1 twice daily  #1 x 0   Entered by:   Ella Jubilee PharmD   Authorized by:   Janeann Forehand Pharm D   Signed by:   Janeann Forehand Pharm D on 10/08/2010   Method used:   Historical   [id number] NOVOLOG FLEXPEN 100 UNIT/ML  SOLN (INSULIN ASPART) 40 units with breakfast and dinner, 20 units with lunch  #1 x 0   Entered by:   Ella Jubilee PharmD   Authorized by:   Rinaldo Ratel D   Signed by:   Janeann Forehand Pharm D on 10/08/2010   Method used:   Historical   [id number]    Orders Added: 1)  Reassessment Each 15 min unit- [facility] [id number]    Prevention & Chronic Care Immunizations   Influenza vaccine: Fluvax MCR  (08/20/2010)   Influenza vaccine due: 08/16/2009    Tetanus booster: 09/18/2003: Done.   Tetanus booster due: 09/17/2013    Pneumococcal vaccine: Done.  (07/19/1995)   Pneumococcal vaccine due: None    H. zoster vaccine: Not documented  Colorectal Screening   Hemoccult: Done.  (09/18/1999)   Hemoccult due: Not Indicated    Colonoscopy: Done.  (02/15/2006)   Colonoscopy due: 02/16/2016  Other Screening   Pap smear: NEGATIVE FOR INTRAEPITHELIAL LESIONS OR MALIGNANCY.  (02/22/2009)   Pap smear due: 02/25/2012    Mammogram: ASSESSMENT: Negative - BI-RADS 1^MM DIGITAL SCREENING  (04/25/2010)   Mammogram action/deferral: Screening mammogram in 1 year.     (02/17/2007)   Mammogram due: 04/2011    DXA bone density scan: Done.  (08/17/1996)   DXA bone density action/deferral: Ordered  (07/20/2009)   DXA scan due: None    Smoking status: never  (08/28/2010)  Diabetes Mellitus   HgbA1C: 9.4  (08/02/2010)   Hemoglobin A1C due: 11/01/2010    Eye exam: normal  (04/17/2010)   Eye exam due: 04/2011    Foot exam: yes  (09/20/2010)   High risk foot: Not documented   Foot care education: Not documented   Foot exam due: 08/03/2011    Urine microalbumin/creatinine ratio: Not documented   Urine microalbumin action/deferral: Not indicated   Urine microalbumin/cr due: 03/30/2008    Diabetes flowsheet reviewed?: Yes   Progress toward A1C goal: Improved  Lipids   Total Cholesterol: 179  (08/02/2010)   Lipid panel  action/deferral: Lipid Panel ordered   LDL: 102  (08/02/2010)   LDL Direct: 97  (03/17/2008)   HDL: 46  (08/02/2010)   Triglycerides: 153  (08/02/2010)   Lipid panel due: 08/03/2011    SGOT (AST): 18  (08/02/2010)   BMP action: Ordered   SGPT (ALT): 15  (08/02/2010)   Alkaline phosphatase: 53  (08/02/2010)   Total bilirubin: 0.6  (08/02/2010)   Liver panel due: 08/03/2011    Lipid flowsheet reviewed?: Yes   Progress toward LDL goal: Unchanged  Hypertension   Last Blood Pressure: 134 / 92  (10/08/2010)   Serum creatinine: 0.74  (08/02/2010)   Serum potassium 4.2  (08/02/2010)    Hypertension flowsheet reviewed?: Yes   Progress toward BP goal: Improved  Self-Management Support :   Personal Goals (by the next clinic visit) :     Personal A1C goal: 8  (07/20/2009)     Personal blood pressure goal: 130/80  (07/20/2009)     Personal LDL goal: 100  (07/20/2009)    Diabetes self-management support: Copy of home glucose meter record, CBG self-monitoring log  (05/07/2010)    Diabetes self-management support not done because: Refused  (08/02/2010)    Hypertension self-management support: BP self-monitoring log, Written self-care plan  (05/07/2010)    Lipid self-management support: Written self-care plan  (07/20/2009)     Lipid self-management support not done because: Good outcomes  (11/20/2009)

## 2010-12-19 NOTE — Assessment & Plan Note (Signed)
Summary: sores on legs/[place]   Vital Signs:  Patient profile:   70 year old female Weight:      214.5 pounds Temp:     98.5 degrees F oral Pulse rate:   72 / minute BP sitting:   130 / 80  (left arm) Cuff size:   large  Vitals Entered By: Mauricia Area CMA, (August 20, 2010 2:07 PM) CC: rash on both legs. burning and itching x 2 days. Is Patient Diabetic? Yes Pain Assessment Patient in pain? no        Primary Provider:  Luis Abed MD  CC:  rash on both legs. burning and itching x 2 days.Marland Kitchen  History of Present Illness: Pt with erythematous rash that started on R lower leg x 1 week ago.  Started as small ulceration that healed then spread up R leg.  Yesterday awoke with same rash on L lower leg.  Rash is non-itchy but does burn.  Patient has had increased swelling in her legs.  Has not noticed any drainage, warmth to the area,  pain to touch.  Denies fever, chills.  Current Medications (verified): 1)  Bayer Childrens Aspirin 81 Mg Chew (Aspirin) .... Take 1 Tablet By Mouth Once A Day 2)  Byetta 10 Mcg Pen 10 Mcg/0.47ml Soln (Exenatide) .... Inject 10 Mcg Subcutaneously Twice A Day 3)  Hyzaar 100-25 Mg Tabs (Losartan Potassium-Hctz) .... Take 1 Tablet By Mouth Once A Day 4)  Lantus 100 Unit/ml Soln (Insulin Glargine) .... Inject 60 Unit Subcutaneously Twice A Day.  Dispense Qs X 66month 5)  Loratadine 10 Mg Tabs (Loratadine) .... Take 1 Tablet By Mouth Once A Day 6)  Synthroid 50 Mcg Tabs (Levothyroxine Sodium) .Marland Kitchen.. 1 Tab By Mouth Daily 7)  Zocor 40 Mg Tabs (Simvastatin) .... Take 1and 1/2  Tablet By Mouth Once A Day 8)  Novolog Flexpen 100 Unit/ml  Soln (Insulin Aspart) .... 35 Units With Breakfast and Dinner, 15 Units With Lunch 9)  Ulticare Short Pen Needles 31g X 8 Mm  Misc (Insulin Pen Needle) .... Use As Directed.  Dispense 1 Box 10)  Celexa 20 Mg  Tabs (Citalopram Hydrobromide) .Marland Kitchen.. 1 Tab By Mouth Daily 11)  Amlodipine Besylate 10 Mg Tabs (Amlodipine Besylate) .Marland Kitchen.. 1  Tab By Mouth Daily For Blood Pressure 12)  Caltrate 600+d 600-400 Mg-Unit Tabs (Calcium Carbonate-Vitamin D) .... 2 Tabs By Mouth Daily 13)  Fish Oil   Oil (Fish Oil) .... 1000mg  Two Times A Day  Purchases Otc Fish Oil 14)  Triamcinolone Acetonide 0.1 % Crea (Triamcinolone Acetonide) .... Apply To Affected Areas On Legs Twice Daily.  Dispense On Large Tube  Allergies (verified): 1)  Avandia (Rosiglitazone Maleate) 2)  Ultracet (Tramadol-Acetaminophen) 3)  Morphine  Review of Systems       Pertinent positives and negatives noted in HPI, Vitals signs noted   Physical Exam  General:  Well-developed,well-nourished,in no acute distress; alert,appropriate and cooperative throughout examination Extremities:  1+ bilateral lower extremity edema.  Erythematous, non-warm rash on medial aspects of lower legs bilaterally. On R leg rash extends from medial malleolus to level of tibial plateau and goes 1/3 of the way around the leg.  On the L leg the rash starts halfway up the lower leg and goes to the level of the tibial plateau and reaches 1/3 around the leg.  Rash is non-blanchable and is not painful to palpation.  No drainage or streaking noted.   Impression & Recommendations:  Problem # 1:  RASH  AND OTHER NONSPECIFIC SKIN ERUPTION (ICD-782.1)  Given amount of edema and varicosities, this may be related to stasis and capillary rupture from chronic edema causing a stasis dermatitis.  Does not look infected at this time.  Another possibility includes a type of vasculitis.  Will have her try compression stockings/ace wraps to reduce edema and try triamcinolone cream to see if this helps irritation.  Will see her back in one week to check progress.  If not improving will do punch biopsy to look for other causes.  Flu vaccine given today also. Her updated medication list for this problem includes:    Triamcinolone Acetonide 0.1 % Crea (Triamcinolone acetonide) .Marland Kitchen... Apply to affected areas on legs twice  daily.  dispense on large tube  Orders: [facility]- Est Level  3 [id number])  Complete Medication List: 1)  Bayer Childrens Aspirin 81 Mg Chew (Aspirin) .... Take 1 tablet by mouth once a day 2)  Byetta 10 Mcg Pen 10 Mcg/0.36ml Soln (Exenatide) .... Inject 10 mcg subcutaneously twice a day 3)  Hyzaar 100-25 Mg Tabs (Losartan potassium-hctz) .... Take 1 tablet by mouth once a day 4)  Lantus 100 Unit/ml Soln (Insulin glargine) .... Inject 60 unit subcutaneously twice a day.  dispense qs x 70month 5)  Loratadine 10 Mg Tabs (Loratadine) .... Take 1 tablet by mouth once a day 6)  Synthroid 50 Mcg Tabs (Levothyroxine sodium) .Marland Kitchen.. 1 tab by mouth daily 7)  Zocor 40 Mg Tabs (Simvastatin) .... Take 1and 1/2  tablet by mouth once a day 8)  Novolog Flexpen 100 Unit/ml Soln (Insulin aspart) .... 35 units with breakfast and dinner, 15 units with lunch 9)  Ulticare Short Pen Needles 31g X 8 Mm Misc (Insulin pen needle) .... Use as directed.  dispense 1 box 10)  Celexa 20 Mg Tabs (Citalopram hydrobromide) .Marland Kitchen.. 1 tab by mouth daily 11)  Amlodipine Besylate 10 Mg Tabs (Amlodipine besylate) .Marland Kitchen.. 1 tab by mouth daily for blood pressure 12)  Caltrate 600+d 600-400 Mg-unit Tabs (Calcium carbonate-vitamin d) .... 2 tabs by mouth daily 13)  Fish Oil Oil (Fish oil) .... 1000mg  two times a day  purchases otc fish oil 14)  Triamcinolone Acetonide 0.1 % Crea (Triamcinolone acetonide) .... Apply to affected areas on legs twice daily.  dispense on large tube  Other Orders: Influenza Vaccine MCR [id number])  Patient Instructions: 1)  The rash on your leg does not look infected. 2)  You do have some swelling in you leg that I think may be causing this.  I want you to keep you legs wrapped with the ace bandages until the swelling improves.  I am also going to send over a prescription for some cream for your legs.  If you notice any drainage from the are, breakdown in skin, bleeding, warmth to the area, or have fever, call the clinic.  I  want you to return in one week to see if this is improving. Prescriptions: TRIAMCINOLONE ACETONIDE 0.1 % CREA (TRIAMCINOLONE ACETONIDE) Apply to affected areas on legs twice daily.  Dispense on large tube  #1 tube x 0   Entered and Authorized by:   Luetta Nutting DO   Signed by:   Luetta Nutting DO on 08/20/2010   Method used:   Electronically to        [facility] [phone]* (retail)       [address]       [place], [place]  [address]  Ph: [phone]       Fax: [phone]   [id number] HYZAAR 100-25 MG TABS (LOSARTAN POTASSIUM-HCTZ) Take 1 tablet by mouth once a day  #90 x 3   Entered and Authorized by:   Luetta Nutting DO   Signed by:   Luetta Nutting DO on 08/20/2010   Method used:   Print then Give to Patient   RxID:   [id number]    Influenza Vaccine    Vaccine Type: Fluvax MCR    Site: left deltoid    Mfr: GlaxoSmithKline    Dose: 0.5 ml    Route: IM    Given by: Mauricia Area CMA,    Exp. Date: 05/14/2011    Lot #: [id number]    VIS given: 06/11/10 version given August 20, 2010.  Flu Vaccine Consent Questions    Do you have a history of severe allergic reactions to this vaccine? no    Any prior history of allergic reactions to egg and/or gelatin? no    Do you have a sensitivity to the preservative Thimersol? no    Do you have a past history of Guillan-Barre Syndrome? no    Do you currently have an acute febrile illness? no    Have you ever had a severe reaction to latex? no    Vaccine information given and explained to patient? yes    Are you currently pregnant? no

## 2010-12-19 NOTE — Progress Notes (Signed)
Summary: Behavioral Medicine Student Consultation  Phone Note Outgoing Call Call back at Home Phone [phone]   Call placed by: Jerrilyn Cairo [facility] Medicine Student) Call placed to: Patient Details for Reason: Follow-up regarding previous meeting Summary of Call: I called Sara Graves to check-in regarding her use of a chart to aid her in remembering to take her insulin each night.  She said that the chart was very helpful and that she felt that she was managing her diabetes well.     [place] Medicine Student  Apr 10, 2010 12:03 PM

## 2010-12-19 NOTE — Assessment & Plan Note (Signed)
Summary: F/U Diabetes  Rx Clinic   Vital Signs:  Patient profile:   70 year old female Weight:      210 pounds Pulse rate:   62 / minute BP sitting:   79 / 53  (left arm)  Primary Care Leeum Sankey:  Luis Abed MD   History of Present Illness: Pt presents today in good spirits.   She was a little stressed yesterday after having to take her son to [facility] for some test. Other than that she has done better with her stress and her upset stomach.  Current Medications (verified): 1)  Bayer Childrens Aspirin 81 Mg Chew (Aspirin) .... Take 1 Tablet By Mouth Once A Day 2)  Byetta 10 Mcg Pen 10 Mcg/0.33ml Soln (Exenatide) .... Inject 10 Mcg Subcutaneously Twice A Day 3)  Hyzaar 100-25 Mg Tabs (Losartan Potassium-Hctz) .... Take 1 Tablet By Mouth Once A Day 4)  Lantus 100 Unit/ml Soln (Insulin Glargine) .... Inject 60 Unit Subcutaneously Twice A Day.  Dispense Qs X 18month 5)  Loratadine 10 Mg Tabs (Loratadine) .... Take 1 Tablet By Mouth Once A Day 6)  Synthroid 50 Mcg Tabs (Levothyroxine Sodium) .Marland Kitchen.. 1 Tab By Mouth Daily 7)  Novolog Flexpen 100 Unit/ml  Soln (Insulin Aspart) .... 40 Units With Breakfast and Dinner, 20 Units With Lunch 8)  Ulticare Short Pen Needles 31g X 8 Mm  Misc (Insulin Pen Needle) .... Use As Directed.  Dispense 1 Box 9)  Celexa 20 Mg  Tabs (Citalopram Hydrobromide) .Marland Kitchen.. 1 Tab By Mouth Daily 10)  Amlodipine Besylate 10 Mg Tabs (Amlodipine Besylate) .... 1/2  Tab By Mouth Daily For Blood Pressure 11)  Caltrate 600+d 600-400 Mg-Unit Tabs (Calcium Carbonate-Vitamin D) .Marland Kitchen.. 1 Twice Daily 12)  Fish Oil   Oil (Fish Oil) .... 1000mg  Two Times A Day  Purchases Otc Fish Oil 13)  Triamcinolone Acetonide 0.1 % Crea (Triamcinolone Acetonide) .... Apply To Affected Areas On Legs Twice Daily.  Dispense On Large Tube 14)  Lipitor 20 Mg Tabs (Atorvastatin Calcium) .... Take One Tab By Mouth Daily.  Allergies (verified): 1)  Avandia (Rosiglitazone Maleate) 2)  Ultracet  (Tramadol-Acetaminophen) 3)  Morphine   Impression & Recommendations:  Problem # 1:  DIABETES MELLITUS II, UNCOMPLICATED ([id number]) Assessment Improved  Diabetes of: longstanding duration currently under: excellent control of blood glucose based on A1C of: 9.4 (on 9/16) with current CBGs of  60 to 140.   Denies hypoglycemic events and snacking episodes in the middle of the night.  Able to verbalize appropriate hypoglycemia management plan.  :Adjusted  basal insulin Lantus from 60 units BID to 55 units BID.  Written pt instructions provided:   F/U MD Clinic Visit: Jan 12th - A1 C at that time.  TTFFC: 20 mins.  Pt seen with: Ella Jubilee, PharmD  Her updated medication list for this problem includes:    Bayer Childrens Aspirin 81 Mg Chew (Aspirin) .Marland Kitchen... Take 1 tablet by mouth once a day    Byetta 10 Mcg Pen 10 Mcg/0.22ml Soln (Exenatide) ..... Inject 10 mcg subcutaneously twice a day    Hyzaar 100-25 Mg Tabs (Losartan potassium-hctz) .Marland Kitchen... Take 1 tablet by mouth once a day    Lantus 100 Unit/ml Soln (Insulin glargine) ..... Inject 55 unit subcutaneously twice a day.  dispense qs x 108month    Novolog Flexpen 100 Unit/ml Soln (Insulin aspart) .Marland KitchenMarland KitchenMarland KitchenMarland Kitchen 40 units with breakfast and dinner, 20 units with lunch  Orders: Reassessment Each 15 min unit- [facility] [id number])  Problem #  2:  HYPERTENSION, BENIGN SYSTEMIC (ICD-401.1) Assessment: Unchanged  Blood pressure today was 79/53. Pt was complaining of edema in legs. Amlodipine was decreased at last visit. With current acceptable blood pressure and edema possibly due to amlodipine will d/c this med today. Will follow up on Jan 12th at which time we will look at blood pressure and edema for possible further changes to regimen.   The following medications were removed from the medication list:    Amlodipine Besylate 10 Mg Tabs (Amlodipine besylate) .Marland Kitchen... 1/2  tab by mouth daily for blood pressure Her updated medication list for this problem includes:     Hyzaar 100-25 Mg Tabs (Losartan potassium-hctz) .Marland Kitchen... Take 1 tablet by mouth once a day  Orders: Reassessment Each 15 min unit- [facility] [id number])  Complete Medication List: 1)  Bayer Childrens Aspirin 81 Mg Chew (Aspirin) .... Take 1 tablet by mouth once a day 2)  Byetta 10 Mcg Pen 10 Mcg/0.39ml Soln (Exenatide) .... Inject 10 mcg subcutaneously twice a day 3)  Hyzaar 100-25 Mg Tabs (Losartan potassium-hctz) .... Take 1 tablet by mouth once a day 4)  Lantus 100 Unit/ml Soln (Insulin glargine) .... Inject 55 unit subcutaneously twice a day.  dispense qs x 47month 5)  Loratadine 10 Mg Tabs (Loratadine) .... Take 1 tablet by mouth once a day 6)  Synthroid 50 Mcg Tabs (Levothyroxine sodium) .Marland Kitchen.. 1 tab by mouth daily 7)  Novolog Flexpen 100 Unit/ml Soln (Insulin aspart) .... 40 units with breakfast and dinner, 20 units with lunch 8)  Ulticare Short Pen Needles 31g X 8 Mm Misc (Insulin pen needle) .... Use as directed.  dispense 1 box 9)  Celexa 20 Mg Tabs (Citalopram hydrobromide) .Marland Kitchen.. 1 tab by mouth daily 10)  Caltrate 600+d 600-400 Mg-unit Tabs (Calcium carbonate-vitamin d) .Marland Kitchen.. 1 twice daily 11)  Fish Oil Oil (Fish oil) .... 1000mg  two times a day  purchases otc fish oil 12)  Triamcinolone Acetonide 0.1 % Crea (Triamcinolone acetonide) .... Apply to affected areas on legs twice daily.  dispense on large tube 13)  Lipitor 20 Mg Tabs (Atorvastatin calcium) .... Take one tab at bedtime  Patient Instructions: 1)  Decrease Lantus to 55 units BID 2)  Stop taking Amlodipine and will re-check bp at visit on Jan 12th 3)  Keep up the great work!!! Prescriptions: LIPITOR 20 MG TABS (ATORVASTATIN CALCIUM) take one tab at bedtime  #90 x 3   Entered by:   Rinaldo Ratel D   Authorized by:   Luis Abed MD   Signed by:   Janeann Forehand Pharm D on 11/05/2010   Method used:   Faxed to ...       [facility] (mail-order)             , [place]         Ph: [phone]       Fax: [phone]   RxID:    [id number] LANTUS 100 UNIT/ML SOLN (INSULIN GLARGINE) Inject 55 unit subcutaneously twice a day.  Dispense QS x 70month  #1 x 0   Entered and Authorized by:   Rinaldo Ratel D   Signed by:   Janeann Forehand Pharm D on 11/05/2010   Method used:   Historical   [id number]    Orders Added: 1)  Reassessment Each 15 min unit- [facility] [id number]

## 2011-02-02 LAB — GLUCOSE, CAPILLARY: Glucose-Capillary: 141 mg/dL — ABNORMAL HIGH (ref 70–99)

## 2011-02-27 LAB — BASIC METABOLIC PANEL
BUN: 18 mg/dL (ref 6–23)
CO2: 25 mEq/L (ref 19–32)
Chloride: 104 mEq/L (ref 96–112)
GFR calc non Af Amer: >60 mL/min (ref >60)
Glucose, Bld: 100 mg/dL — ABNORMAL HIGH (ref 70–99)
Potassium: 3.9 mEq/L (ref 3.5–5.1)
Sodium: 138 mEq/L (ref 135–145)

## 2011-03-31 ENCOUNTER — Encounter: Payer: Self-pay | Admitting: Family Medicine

## 2011-03-31 ENCOUNTER — Ambulatory Visit (INDEPENDENT_AMBULATORY_CARE_PROVIDER_SITE_OTHER): Payer: Medicare Other | Admitting: Family Medicine

## 2011-03-31 VITALS — BP 138/72 | HR 68 | Temp 97.2°F | Wt 207.9 lb

## 2011-03-31 DIAGNOSIS — L259 Unspecified contact dermatitis, unspecified cause: Secondary | ICD-10-CM

## 2011-03-31 DIAGNOSIS — I10 Essential (primary) hypertension: Secondary | ICD-10-CM

## 2011-03-31 DIAGNOSIS — E119 Type 2 diabetes mellitus without complications: Secondary | ICD-10-CM

## 2011-03-31 LAB — BASIC METABOLIC PANEL
CO2: 23 mEq/L (ref 19–32)
Calcium: 10.2 mg/dL (ref 8.4–10.5)
Glucose, Bld: 60 mg/dL — ABNORMAL LOW (ref 70–99)
Potassium: 3.8 mEq/L (ref 3.5–5.3)
Sodium: 141 mEq/L (ref 135–145)

## 2011-03-31 LAB — CBC
HCT: 43.5 % (ref 36.0–46.0)
Hemoglobin: 15.2 g/dL — ABNORMAL HIGH (ref 12.0–15.0)
MCHC: 34.9 g/dL (ref 30.0–36.0)
MCV: 94.8 fL (ref 78.0–100.0)

## 2011-03-31 LAB — POCT GLYCOSYLATED HEMOGLOBIN (HGB A1C): Hemoglobin A1C: 9.9

## 2011-03-31 MED ORDER — CLONIDINE HCL 0.1 MG PO TABS: 0.1000 mg | ORAL_TABLET | Freq: Two times a day (BID) | ORAL | Status: DC

## 2011-03-31 NOTE — Patient Instructions (Addendum)
Nice to see you again. Your A1c is 9.9 today. Much better than last time. PLease make an appointment with Dr. Valentina Lucks in next 1-2 weeks. Fill out this food log and take this with you, along with your blood sugar log. I will call you if your lab tests are abnormal. Use hydrocortisone cream on your skin rash for itching. Follow up if your symptoms do not improve or worsen.  Diabetes Meal Planning Guide The diabetes meal planning guide is a tool to help you plan your meals and snacks. It is important for people with diabetes to manage their blood sugar levels. Choosing the right foods and the right amounts throughout your day will help control your blood sugar. Eating right can even help you improve your blood pressure and reach or maintain a healthy weight. CARBOHYDRATE COUNTING MADE EASY When you eat carbohydrates, they turn to sugar (glucose). This raises your blood sugar level. Counting carbohydrates can help you control this level so you feel better. When you plan your meals by counting carbohydrates, you can have more flexibility in what you eat and balance your medicine with your food intake. Carbohydrate counting simply means adding up the total amount of carbohydrate grams (g) in your meals or snacks. Try to eat about the same amount at each meal. Foods with carbohydrates are listed below. Each portion below is 1 carbohydrate serving or 15 grams of carbohydrates. Ask your dietician how many grams of carbohydrates you should eat at each meal or snack. Grains and Starches 1 slice bread 1/2 English muffin or hotdog/hamburger bun 3/4 cup cold cereal (unsweetened) 1/3 cup cooked pasta or rice 1/2 cup starchy vegetables (corn, potatoes, peas, beans, winter squash) 1 tortilla (6 inches) 1/4 bagel 1 waffle or pancake (size of a CD) 1/2 cup cooked cereal 4 to 6 small crackers *Whole grain is recommended Fruit 1 cup fresh unsweetened berries, melon, papaya, pineapple 1 small fresh fruit 1/2  banana or mango 1/2 cup fruit juice (4 ounces unsweetened) 1/2 cup canned fruit in natural juice or water 2 tablespoons dried fruit 12 to 15 grapes or cherries Milk and Yogurt 1 cup fat-free or 1% milk 1 cup soy milk 6 ounces light yogurt with sugar-free sweetener 6 ounces low-fat soy yogurt 6 ounces plain yogurt Vegetables 1 cup raw or 1/2 cup cooked is counted as 0 carbohydrates or a "free" food. If you eat 3 or more servings at one meal, count them as 1 carbohydrate serving. Other Carbohydrates 3/4 ounces chips or pretzels 1/2 cup ice cream or frozen yogurt 1/4 cup sherbet or sorbet 2 inch square cake, no frosting 1 tablespoon honey, sugar, jam, jelly, or syrup 2 small cookies 3 squares of graham crackers 3 cups popcorn 6 crackers 1 cup broth-based soup Count 1 cup casserole or other mixed foods as 2 carbohydrate servings. Foods with less than 20 calories in a serving may be counted as 0 carbohydrates or a "free" food. You may want to purchase a book or computer software that lists the carbohydrate gram counts of different foods. In addition, the nutrition facts panel on the labels of the foods you eat are a good source of this information. The label will tell you how big the serving size is and the total number of carbohydrate grams you will be eating per serving. Divide this number by 15 to obtain the number of carbohydrate servings in a portion. Remember: 1 carbohydrate serving equals 15 grams of carbohydrate. SERVING SIZES Measuring foods and serving sizes helps  you make sure you are getting the right amount of food. The list below tells how big or small some common serving sizes are.  1 ounce (oz) of cheese.................................4 stacked dice.   2 to 3 oz cooked meat.................................Marland KitchenDeck of cards.   1 teaspoon (tsp)...........................................Marland KitchenTip of little finger.   1 tablespoon  (tbs).......................................Marland KitchenMarland KitchenThumb.   2 tbs............................................................Marland KitchenGolf ball.    cup..........................................................Marland KitchenHalf of a fist.   1 cup...........................................................Marland KitchenA fist.  SAMPLE DIABETES MEAL PLAN Below is a sample meal plan that includes foods from the grain and starches, dairy, vegetable, fruit, and meat groups. A dietician can individualize a meal plan to fit your calorie needs and tell you the number of servings needed from each food group. However, controlling the total amount of carbohydrates in your meal or snack is more important than making sure you include all of the food groups at every meal. You may interchange carbohydrate containing foods (dairy, starches, and fruits). The meal plan below is an example of a 2000 calorie diet using carbohydrate counting. This meal plan has 17 carbohydrate servings (carb choices). Breakfast 1 cup oatmeal (2 carb choices) 3/4 cup light yogurt (1 carb choice) 1 cup blueberries (1 carb choice) 1/4 cup almonds  Snack 1 large apple (2 carb choices) 1 low-fat string cheese stick  Lunch Chicken breast salad:  1 cup spinach   1/4 cup chopped tomatoes   2 oz chicken breast, sliced   2 tbs low-fat [place] dressing  12 whole-wheat crackers (2 carb choices) 12 to 15 grapes (1 carb choice) 1 cup low-fat milk (1 carb choice)  Snack 1 cup carrots 1/2 cup hummus (1 carb choice)  Dinner 3 oz broiled salmon 1 cup brown rice (3 carb choices)  Snack 1 1/2 cups steamed broccoli (1 carb choice) drizzled with 1 tsp olive oil and lemon juice 1 cup light pudding (2 carb choices)  DIABETES MEAL PLANNING WORKSHEET Your dietician can use this worksheet to help you decide how many servings of foods and what types of foods are right for you.  Breakfast Food Group and Servings Carb Choices Grain/Starches  _______________________________________ Dairy ______________________________________________ Vegetable _______________________________________ Fruit _______________________________________________ Meat _______________________________________________ Fat _____________________________________________ Lunch Food Group and Servings Carb Choices Grain/Starches ________________________________________ Dairy _______________________________________________ Fruit ________________________________________________ Meat ________________________________________________ Fat _____________________________________________ Dinner Food Group and Servings Carb Choices Grain/Starches ________________________________________ Dairy _______________________________________________ Fruit ________________________________________________ Meat ________________________________________________ Fat _____________________________________________ Snacks Food Group and Servings Carb Choices Grain/Starches ________________________________________ Dairy _______________________________________________ Vegetable ________________________________________ Fruit ________________________________________________ Meat ________________________________________________ Fat _____________________________________________ Daily Totals Starches _________________________ Vegetable __________________________ Fruit ______________________________ Dairy ______________________________ Meat ______________________________ Fat ________________________________  Document Released: 07/31/2005 Document Re-Released: 04/23/2010 ExitCare Patient Information 2011 [place].

## 2011-03-31 NOTE — Assessment & Plan Note (Signed)
At goal currently with addition of low dose clonidine. Will continue current management and check labs today. Nearly 20 point difference with manual cuff vs. Automatic. Encouraged pt to check values at home for goal <130/80.

## 2011-03-31 NOTE — Assessment & Plan Note (Signed)
Reassured patient this should stabilize and improve in 1-2 days. May try hydrocortisone cream topically prn.

## 2011-03-31 NOTE — Progress Notes (Signed)
  Subjective:    [name], female    DOB: 01/06/1941, 70 y.o.   MRN: [id number]  HPI 1. DM. States she has been more compliant with insulin therapy lately. Lantus 60 BID and novolog with breakfast and dinner. CBGs fasting 85-120. Lunch 120-185, dinner 90-200. Has not been eating regularly due to crazy schedule. Son was recently hospitalized and spends most her time taking care of him, also took her mother to doctor's appt yest. Had ophthalmology f/u 12/2009 and stable vision, saw podiatrist last month who cut part of left big toenail out due to ingrowth. No ulcers. Is feeling tingling in her little toe recently.   2. Rash. Itchy rash x3-4 days. Onset after doing some work in the garden. Possibly poison ivy. On left elbow and left breast, mild area in right inguinal fold. Stable for 2 days, no bleeding or oozing.    Review of Systems Denies CP, SOB, LE edema, foot ulcer, visual changes, weight change.    Objective:   Physical Exam  Vitals reviewed. Constitutional: She is oriented to person, place, and time. She appears well-developed and well-nourished. No distress.  HENT:  Head: Normocephalic and atraumatic.  Eyes: EOM are normal. Pupils are equal, round, and reactive to light.  Cardiovascular: Normal rate, regular rhythm and normal heart sounds.   No murmur heard. Pulmonary/Chest: Effort normal.  Musculoskeletal: She exhibits no edema and no tenderness.       Vascular congestion and varicosities bilateral LEs. No edema. No skin breakdown. Callus over left lateral plantar foot, hyperkeratotic. Decreased sensation in left 5th toe.  Neurological: She is alert and oriented to person, place, and time.  Psychiatric: She has a normal mood and affect.          Assessment & Plan:

## 2011-03-31 NOTE — Assessment & Plan Note (Addendum)
A1c improved from last measurement. Compliance is likely an issue as pt seems unsure about her doses for novolog with mealtime coverage. Fasting values appear controlled, would consider changes to meal coverage (possibly sliding scale due to variable diet and inconsistent schedule? Or addition of metformin ER). Patient seems interested in gaining better control, encouraged her to make appt with Pharm clinic/Dr. Valentina Lucks to sort out meal coverage more in depth. Discussed food diary and dietary control again today.   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 Ophthalmology exam completed 12/2010 and podiatry 03/2011. Continue routine maintenance.

## 2011-04-01 NOTE — Op Note (Signed)
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   PROCEDURE:  Implantation of permanent spinal cord stimulator.   COMPLICATIONS:  None.   CONDITION:  Stable.   HISTORY:  The patient is a very pleasant 70 year old female who has been  having chronic low back pain after spinal surgery.  Trial implantation  of a spinal cord stimulator was very successful in elevating her pain.  Giving the success of this we elected to proceed with permanent  implantation.   All appropriate risks, benefits and alternatives of surgery were  discussed with the patient and consent was obtained.   PROCEDURE IN DETAIL:  In the preoperative holding area the patient  indicated that she wanted to place the battery on the right side.  This  was measured with her standing and sitting to ensure there was proper  positioning.  After doing that the patient was brought to the operating  room and placed supine on the operating table.  After successful  induction of general anesthesia and endotracheal intubation, TEDs and  SCDs were applied, and she was turned prone onto a Wilson frame.  All  bony prominences were well padded and the back was prepped and draped in  standard fashion.  Then using fluoroscopy I identified the L1-2  vertebral body space and then identified the T11 vertebral body space  and the T10 vertebral body space based on her preoperative studies.  The  patient had successful deimplantation of trial spanning T8 and T9.  As  such, I planned on making a T10  laminectomy.   At this point in time with the position made I made an incision in the  mid to lower thoracic spine.  Sharp dissection was carried out down to  the deep fascia and the deep fascia was sharply incised and then I  exposed the T10-11 interbody space.  Once this was exposed I confirmed  the T10 lamina, again counting up from L5.  Once I reconfirmed the  appropriate level I then used a Leksell rongeur to resect the majority  of the T10 spinous process.  I then used a sawing curette to develop the  plane underneath the ligamentum flavum and then using a combination of 2-  and 3-mm Kerrisons I resected the ligamentum flavum and completed the  T10 laminectomy.   At this point with successful  decompression completed I then took a  dural elevator and I was able to pass it superiorly.  I then passed the  trial dural separator from the spinal cord stimulator implant so that I  knew I could freely pass the device itself.  I then took the standard  electrode lead and advanced it to the appropriate depth.  There was no  resistance.  Once I gently positioned this correctly on the dorsal  surface of the thecal sac I then took an x-ray to confirm it was midline  and it was spanning T8-9.  Once confirmed I then made a window in the  T11 spinous process and threaded the lead through this and circled it  back around the spinous process of T11.  This allowed it to be secured  into position so that it would not migrate.  I then sutured it with  FiberWire directly to the spinous process to prevent it from moving.  I  then made another incision on the right side for the battery site.  I  then measured down 2.5 cm and then made a pocket for the battery.  I  then passed a trocar from the thoracic wound to the battery site and  then removed the trocar.  I then passed the leads from the thoracic  wound through the passer submuscularly to the battery site.  I then  connected to the battery, secured  it in position and tested the battery  to ensure it was working fine.  Once this was confirmed I then wrapped  excess leads underneath the battery and positioned the battery in the  pocket with the excess leads underneath the battery.  I then secured the  battery to the deep fascia with a 1-Ehtibond suture.  I then irrigated  both wounds copiously with normal saline and closed the deep fascia with  interrupted 1-Vicryl sutures at both wound sites, the superficial with 2-  0 top off Vicryls at both wound sites and then 3-0 Monocryl for the skin  at both sites.  Of note, prior to closing I did obtain hemostasis with  bipolar electrocautery.  Steri-Strips and Mepilex dressing were applied.  The patient was extubated and transferred to the PACU without incident.  At the end of the case, all needle and sponge counts were correct.  The  patient tolerated the procedure well.      Dahlia Bailiff, MD  Electronically Signed     DDB/MEDQ  D:  06/15/2008  T:  06/15/2008  Job:  [id number]

## 2011-04-01 NOTE — Op Note (Signed)
NAME:  Sara Graves, Sara Graves               ACCOUNT NO.:  [id number]   MEDICAL RECORD NO.:  [id number]          PATIENT TYPE:   LOCATION:                                 FACILITY:   PHYSICIAN:  Jarome Matin. Meyerdierks, M.D.DATE OF BIRTH:   DATE OF PROCEDURE:  01/17/2009  DATE OF DISCHARGE:                               OPERATIVE REPORT   PREOPERATIVE DIAGNOSIS:  Volar retinacular ganglion, left ring finger.   POSTOPERATIVE DIAGNOSIS:  Volar retinacular ganglion, left ring finger.   PROCEDURE:  Excision of volar retinacular ganglion, left ring finger.   SURGEON:  Daryl Eastern, MD   ANESTHESIA:  A 0.25 mL Marcaine local with sedation.   OPERATIVE FINDINGS:  The patient had a typical volar retinacular  ganglion at the proximal digital crease of the left ring finger.  The  radial digital nerve was tented over at the top of it.   DESCRIPTION OF PROCEDURE:  Under 0.5% Marcaine local anesthesia with a  tourniquet on the left arm, the left hand was prepped and draped in  usual fashion.  After exsanguinating the limb, the tourniquet was  inflated to 250 mmHg.  A V-shaped incision was made over the volar  aspect of the finger overlying the proximal digital crease.  Blunt  dissection was carried through the subcutaneous tissues.  Care was taken  to protect the digital nerves.  The radial digital nerve was found to be  tented over the mass and was retracted radially.  The mass was then  excised down to its origin from the flexor sheath.  The wound was then  irrigated copiously with saline.  The skin was closed with 4-0 nylon  sutures.  Sterile dressings were applied.  The tourniquet was released  with good circulation of the hand.  The patient went to the recovery  room awake in stable and good condition.      Daryl Eastern, M.D.  Electronically Signed     EMM/MEDQ  D:  01/18/2009  T:  01/18/2009  Job:  [id number]

## 2011-04-01 NOTE — Op Note (Signed)
NAME:  Sara Graves, Sara Graves               ACCOUNT NO.:  [id number]   MEDICAL RECORD NO.:  [id number]          PATIENT TYPE:  AMB   LOCATION:  ENDO                         FACILITY:  [facility]   PHYSICIAN:  John C. Amedeo Plenty, M.D.    DATE OF BIRTH:  1941/08/02   DATE OF PROCEDURE:  DATE OF DISCHARGE:                               OPERATIVE REPORT   Colonoscopy with polypectomy.   INDICATIONS FOR PROCEDURE:  History of adenomatous colon polyp very near  to the appendiceal orifice with recent polypectomy, uncertain whether  lesion completely fulgurated.   PROCEDURE:  The patient was placed in the left lateral decubitus  position and placed on the pulse monitor with continuous low-flow oxygen  delivered by nasal cannula.  She was sedated with 75 mcg IV fentanyl and  7.5 mg IV Versed.  The Olympus video colonoscope was inserted into the  rectum and advanced to the cecum, confirmed by transillumination at  McBurney's point and visualization of ileocecal valve and appendiceal  orifice.  The prep was good.  I observed the cecum for a considerable  amount of time and could not see any discernible lesion.  I probed the  appendiceal orifice with the closed biopsy forceps but did not see any  visible adenomatous tissue.  After extensive observation and probing, it  appeared that the previously seen lesion was completely fulgurated.  There was, however, a small simple sessile polyp on the first fold past  the ileocecal valve clearly distinctive from the previously fulgurated  polyp.  This was fulgurated with a hot biopsy forceps.  The remainder of  the ascending, transverse, descending, sigmoid, and rectum all appeared  normal with no masses, polyps, diverticula, or other mucosal  abnormalities.  Scope was then withdrawn, and the patient returned to  the recovery room in stable condition.  She tolerated the procedure  well.  There were no immediate complications.   IMPRESSION:  1. No visible remaining polyp  at the site of the previous      periappendiceal polyp, which was fulgurated 3 months ago.  2. Small proximal ascending colon polyp.   PLAN:  Await histology, and we will probably pursue repeat colonoscopy  in 1-2 years given the history of previous recurrence of the polyp at  the vicinity of the appendiceal orifice.            ______________________________  Elyse Jarvis. Amedeo Plenty, M.D.     JCH/MEDQ  D:  03/05/2009  T:  03/06/2009  Job:  [id number]   cc:   Jessy Oto. Oneida Alar, MD

## 2011-04-03 ENCOUNTER — Other Ambulatory Visit (INDEPENDENT_AMBULATORY_CARE_PROVIDER_SITE_OTHER): Payer: Medicare Other | Admitting: *Deleted

## 2011-04-03 DIAGNOSIS — F329 Major depressive disorder, single episode, unspecified: Secondary | ICD-10-CM

## 2011-04-03 MED ORDER — CITALOPRAM HYDROBROMIDE 20 MG PO TABS: 20.0000 mg | ORAL_TABLET | Freq: Every day | ORAL | Status: DC

## 2011-04-04 NOTE — Op Note (Signed)
[facility]  Patient:    Sara Graves, Sara Graves                        MRN: [id number] Proc. Date: 05/06/00 Attending:  Jeneen Rinks P. Aplington, M.D.                           Operative Report  PREOPERATIVE DIAGNOSIS:  Osteoarthritis, right knee.  POSTOPERATIVE DIAGNOSIS:  Severe tricompartmental osteoarthritis, right knee.  OPERATION:  Osteonics total knee replacement, right.  SURGEON:  Laurice Record. Aplington, M.D.  ASSISTANT:  Jessy Oto, M.D.  ANESTHESIA:  Spinal.  PATHOLOGY AND INDICATION FOR PROCEDURE:  In July 1999, I had arthroscoped her knee and found some minimal cartilage tears but advanced osteoarthritis at that time.  Since then, we have had a full course of medication, hyalgan injections, with pain now that is uncontrollable with medication and is limiting her activity.  At surgery, she was found to have advanced tricompartmental arthritis in the patellofemoral groove.  She had full-thickness wear with some fragments of articular cartilage in the joint and have full-thickness wear over both femoral condyles.  DESCRIPTION OF PROCEDURE:  Prophylactic antibiotics, satisfactory spinal anesthesia, Foley catheter inserted.  Pneumatic tourniquet proximal right thigh, Sure-Foot, Duraprep.  The right leg was prepped from tourniquet to ankle and draped as a sterile field.  Ioban employed.  Vertical midline incision down to the patellar mechanism with median parapatellar incision to open the joint.  The patellar mechanism was freed up, the patella everted, and the knee flexed.  Osteophytes from around the femur and the patella were removed.  Reactive synovitis in the suprapatellar notch was excised.  Anterior cruciate ligament was sacrificed.  The anterior portions of the medial and lateral menisci were freed up and excised.  I then made a 5/16 inch drill hole in the distal femur, followed by the canal finder and intramedullary rod with the distal  femoral cutting guide for a 10 mm cut with the alignment rod set at 5 degrees for the right leg.  After the cut was made, we then sized the distal femur.  Preoperative templating indicated a 7, and this was indeed the size that we found at surgery.  The placement holes for the distal femoral cutting jig were then placed and distal cutting jig applied with anterior and posterior cuts and posterior and anterior chamferings made.  Then returned to the tibia, where the remnants of the menisci were removed, and an initial leveling cut was made and the tibia sized as templated at #7.  Using the size tray, the initial intramedullary drill hole was made, followed by the step-cut drill and the canal finder.  We inserted the intramedullary rod into this to fix the cutting jig for the proximal tibial cut and elected to take a 6 mm cut and aligned up the cutting jig with a 5 degree posterior cut, with the extramedullary rod displaying the bimalleolar distance.  After the cuts were made, we then used the lamina spreader to remove the remaining bone, soft tissue, and remnants of the menisci in the posterior femoral areas medially and laterally.  I then placed the patellar cutting guide and the patellar groove was constructed.  Then placed a trial femoral component and found that it fit nicely on the femur, and we then sized the tibial spacer tentatively at a 12 and lined the tray up with extramedullary rod displaying the  bimalleolar distance once again, and lined up the scribe lines on the tibial tray anteriorly on the anterior tibia.  The knee was held in extension.  I then sized the patella at 26 and used the 10 mm recessed patellar cutting guide to make a 10 mm recessed cut, then placed the guide inside it for creation of three fixation drill holes, which were drilled.  The prosthesis was then placed and excess bone from around the perimeter was removed with a small rongeur.  We returned to the tibia,  where the 7 base plate was fixed to the cut surface of the tibia with three pins and the tripod apparatus for cutting the tibial keel was then made, working up to a 7 cemented.  When these preliminaries had been completed, we then went ahead and water-picd the knee while the methyl methacrylate was being mixed.  When the glue was of proper consistency, I then began gluing in the components, working first on the tibia, where the 7 tibial tray was impacted tightly with excess methyl methacrylate being trimmed up, then working on the femur, with excess methacrylate being again removed, and the knee was then held in extension with a 10 mm spacer while we glued in the patella, which was held with C-clamp and excess methacrylate removed.  Once the methyl methacrylate had hardened, I removed the patellar holder and the 10 mm tibial tray, and we inspected for particulate pieces of methacrylate.  Gelfoam was placed in the distal femoral hole.  We tried a 12 mm spacer, which seemed to be an ideal fit with excellent medial and lateral stability, motion from 0 to past 90 degrees.  Consequently, we used this 12 mm spacer as our final component and after irrigating the tray and making sure there was no soft tissue interposition, we placed the final 12 mm spacer and reduced the knee, and it was nice and stable.  A lateral patellar release was required and performed.  A Hemovac was then inserted and the wound closed in two layers initially in the quadriceps and distally in the synovium and capsule.  Vicryl 0 in the subcutaneous tissue superiorly and 2-0 distally, and staples in the skin.  Betadine and dry sterile dressing were applied.  Tourniquet was released.  She tolerated the procedure well, was taken to the recovery room in satisfactory condition with no known complications.  Estimated blood loss was no more than 100 cc.  No blood replaced. DD:  05/06/00 TD:  05/09/00 Job: [id number]

## 2011-04-04 NOTE — Discharge Summary (Signed)
[facility]  Patient:    Sara Graves, Sara Graves                      MRN: [id number] Adm. Date:  [id number] Disc. Date: [id number] Attending:  Tarri Glenn Page Dictator:   Alexzandrew L. Dara Lords, P.A.-C. CC:         Laurice Record. Aplington, M.D.             Kathee Delton, M.D. [facility]                           Discharge Summary  ADMISSION DIAGNOSES:  1. End-stage osteoarthritis right knee.  2. Non-insulin-dependent diabetes mellitus.  3. Hypercholesterolemia.  4. Hypertension.  5. Allergic rhinitis.  6. Irritable bowel syndrome.  7. Hypothyroidism.  DISCHARGE DIAGNOSES:  1. Severe tricompartmental osteoarthritis right knee, status post right total nee     replacement arthroplasty.  2. Postoperative anemia.  3. Status post transfusion without sequelae.  4. Postoperative hypoxemia.  5. Non-insulin-dependent diabetes mellitus.  6. Hypercholesterolemia.  7. Hypertension.  8. Allergic rhinitis.  9. Irritable bowel syndrome. 10. Hypothyroidism.  PROCEDURE:  The patient was taken to the OR on May 06, 2000 and underwent a right total knee replacement arthroplasty; surgeon Dr. Tarri Glenn, assistant Dr.  Basil Dess.  Surgery done under spinal anesthesia.  CONSULTATIONS:  Pulmonary, Dr. Danton Sewer.  BRIEF HISTORY:  The patient is a 70 year old female who has seen been by [place] for  end-stage osteoarthritis of the right knee.  It has been refractory to conservative care.  She has undergone arthroscopy in the past.  She is having difficulty ambulating and difficulty with pain at rest.  She wishes to proceed with a total knee replacement arthroplasty.  She donated 2 units of autologous blood in preparation for the up and coming surgery.  LABORATORY DATA:  CBC on admission:  Hemoglobin 12.6, hematocrit 35.2, red cell  count 3.85, white cell count 6.1.  Differential within normal limits.  Serum H&Hs were followed throughout the hospital course.   Postoperative hemoglobin dropped  down to a level of 10.6, hematocrit of 30.2.  Hemoglobin and hematocrit continued to decline to a level of 8.4 with 24.1 hematocrit.  The patient underwent transfusion.  Post-transfusion hemoglobin 10.7 and hematocrit of 29.7.  ABG taken on May 07, 2000 showed a pH of 7.447, PCO2 of 37.7, PO2 of 40.8, bicarb of 25.4, total CO2 of 26.6, O2 saturation of 78.6%.  Followup ABG on May 08, 2000 showed a pH of 7.482, PCO2 of 36.3, PO2 of 40.7, bicarb of 26.5, total CO2 of 27.7, O2 saturation of 80.2.  PT/PTT on admission were 12.9 and 27 respectively with an NR of 1.0.  Serial pro times were followed per Coumadin protocol.  Last noted PT and INR was 15.5 and 2.2 respectively.  Chem panel on admission:  Elevated glucose f 299.  She was a known diabetic.  Remaining chem panel all within normal limits.  Followup BMET showed glucose did improve down to a level of 264 and was last noted at 279 on BMET.  Calcium dropped from 9.7 to 8.2 to 7.6 respectively.  There was a drop in sodium from 137 to 131.  Troponin-I taken on May 08, 2000:  Less than 0.03.  Urinalysis on admission:  Positive glucose, a few epithelial cells, few bacteria with 0-5 white cells and red cells per high-power field.  Blood group ype A+.  EKG on admission:  Normal sinus rhythm, minimal voltage criteria for left ventricular hypertrophy, may be a normal variant.  No significant change since ast tracing of February 1998.  This was confirmed by Dr. Jacqulyn Ducking.  Followup EKG on May 08, 2000:  Normal sinus rhythm, left ventricular hypertrophy no longer present, confirmed by Dr. Jenell Milliner.  Preop chest x-ray:  Stable chest. Preop knee films:  Tricompartmental degenerative changes, most notable involving the lateral tibiofemoral joint space, films of the right knee.  CT scan dated  May 08, 2000:  No evidence of pulmonary embolus involving the main pulmonary arteries or adjacent  subsegmental branches.  Small left upper pole renal calculus. Followup chest on May 08, 2000:  No evidence of infiltrate or congestive heart  failure, slightly prominent heart size and central pulmonary vasculature.  HOSPITAL COURSE:  The patient was admitted to [facility], taken to the OR, and underwent the above-stated procedure without complications.  The patient tolerated the procedure well and later returned to the recovery room and then to the orthopedic floor for continued postop care.  Vital signs were followed on the patient throughout the hospital course.  Hemovac drain was placed at the time of surgery.  This was pulled postoperatively on postop day #1.  The patient had a air amount of difficulty with pain control.  She was on PC analgesics postoperatively. She was weaned over to p.o. analgesics throughout the hospital course.  By postop day #2, PCA was removed and IV fluids were discontinued.  Foley was removed while weaning off of O2; however, later that day she developed some acute hypoxemia. A consult was called.  The patient was seen and evaluated by Dr. Danton Sewer. X-rays and a CT of the chest was ordered to rule out pulmonary embolus.  No evidence of embolus was found.  This was felt to be due to atelectasis and VQ mismatching.  She was encouraged for mobility and incentive spirometry.  She continued with her physical therapy to encourage mobilization.  She was noted to have a drop in hemoglobin postoperatively down to 8.4.  She was transfused with  packed cells.  Post-procedure hemoglobin back up to a level of 10.7.  She did improve with her therapy and also much improvement following the transfusion. y postop day #5, she was doing much better.  No complaints of shortness of breath or trouble breathing.  She was ambulating well with physical therapy and it was decided, since she was cleared, she was to be discharged home.  DISPOSITION:  The  patient is discharged home on May 11, 2000.  DISCHARGE MEDICATIONS: 1. Percocet p.r.n. pain. 2. Robaxin p.r.n. spasm. 3. Coumadin as per pharmacy protocol.   DIET:  Low sodium/low cholesterol diabetic diet.  ACTIVITY:  Up as tolerated.  Home health physical therapy and home health nursing for total knee protocol.  She can discontinue her mobilizer at the time of discharge.  FOLLOW-UP:  Follow up in two weeks.  CONDITION ON DISCHARGE:  Improved. DD:  06/09/00 TD:  06/11/00 Job: [id number]

## 2011-04-04 NOTE — Discharge Summary (Signed)
NAME:  Sara Graves, Sara Graves               ACCOUNT NO.:  [id number]   MEDICAL RECORD NO.:  [id number]          PATIENT TYPE:  INP   LOCATION:  [id number]                         FACILITY:  [facility]   PHYSICIAN:  Tarri Glenn, M.D.  DATE OF BIRTH:  1941/09/28   DATE OF ADMISSION:  01/04/2007  DATE OF DISCHARGE:  01/06/2007                               DISCHARGE SUMMARY   ADMITTING DIAGNOSES:  1. Spinal stenosis L2-L3, L3-L4, L4-L5, L5-S1.  2. Diabetes.  3. Hypertension.  4. Hypothyroidism.   DISCHARGE DIAGNOSES:  1. Spinal stenosis L2-L3, L3-L4, L4-L5, L5-S1.  2. Diabetes.  3. Hypertension.  4. Hypothyroidism.  5. Acute postop anemia.  6. Hypernatremia (resolved).   OPERATION:  On January 04, 2007, the patient underwent central and  lateral recess and foraminal decompression of L2 to the sacrum, Dr.  Latanya Maudlin assisted.   BRIEF HISTORY:  This 70 year old female with progressive problems  concerning pain in the lumbar spine, right hip and into the lower  extremity.  We tried conservative measures including Lyrica but  unfortunately she has had continuing pain which now is resistant to  analgesics.  Lumbar myelogram CT scan showed stenosis from L2 to the  sacrum.  After much discussion including the risks and benefits of  surgery, it was felt she would benefit from surgical intervention and  was admitted for the above procedure.   COURSE IN THE HOSPITAL:  The patient tolerated the surgical procedure  quite well. A Penrose drain which was inserted intraoperatively  continued to drain as expected.  This drain was advanced on the first  postoperative day and dressing was changed.  It was noted the patient  had hypernatremia on her preoperative labs on December 31, 2006.  This  was repeated postop and found that the sodium was normal at of 138 from  153.   The patient entered quickly into her rehabilitation program utilizing  physical therapy and occupational therapy.  She had a  drainage which was  expected as mentioned above.   Neurovascular remained intact to the lower extremity.  She had marked  relief in her pain postoperatively.  On the day of anticipated  discharge, she achieved ADLs.  She was awake, alert, and oriented. She  was voiding and was using her incentive spirometer as instructed.  She  as well as we felt that she could be maintained in her home environment  and arrangements were made for discharge.   Laboratory values in the hospital hematologically showed a CBC with  differential preoperatively completely within normal limits.  The  hypernatremia was noted at 153.  Again this was repeated to be normal  after surgery.  Urinalysis essentially negative for urinary tract  infection.   X-RAYS:  Films are noted to be read by Dr. Zigmund Daniel from September 13, 2006  primarily for the ribs.  Findings of the chest showed no fractures of  the ribs, lung contusion, or pneumothorax.  Electrocardiogram showed  normal sinus rhythm.   CONDITION ON DISCHARGE:  Improved, stable.   PLAN:  The patient is discharged to her home to continue with her  ADLs  and physical therapy.  She assures [place] that her husband will be able to  change her bandage at home and therefore no home health is indicated at  this time.  Temperature was 98.1 at discharge.  She was given Robaxin  for muscle spasms, Percocet 5/325 for pain, Trinsicon b.i.d. for her  postoperative anemia for 1 month and one aspirin a day.  She is return  to see Dr. Shellia Carwin 2 weeks after the date of surgery.  I encouraged  her to call us should she have any problems or questions.      Dooley L. Vanita Ingles.    ______________________________  Tarri Glenn, M.D.    DLU/MEDQ  D:  01/06/2007  T:  01/06/2007  Job:  [id number]

## 2011-04-04 NOTE — Op Note (Signed)
NAME:  Sara Graves, Sara Graves               ACCOUNT NO.:  [id number]   MEDICAL RECORD NO.:  [id number]          PATIENT TYPE:  AMB   LOCATION:  ENDO                         FACILITY:  [facility]   PHYSICIAN:  John C. Amedeo Plenty, M.D.    DATE OF BIRTH:  [birth year]   DATE OF PROCEDURE:  11/05/2006  DATE OF DISCHARGE:                               OPERATIVE REPORT   INDICATIONS FOR PROCEDURE:  Persistent broad sessile cecal polyp,  requiring multiple colonoscopies with piecemeal snaring and erbi laser  therapy of this lesion.  This was last done 2 or 3 months ago. Procedure  is to reinspect the area and hopefully achieved complete obliteration of  the cecal lesion.   PROCEDURE:  The patient was placed in the left lateral decubitus  position and placed on the pulse monitor, with continuous low-flow  oxygen delivered by nasal cannula.  She was sedated with 100 mcg IV  fentanyl and 7.5 mg IV Versed.  The Olympus video colonoscope was  inserted into the rectum and advanced to the cecum, confirmed by  transillumination at McBurney's point and visualization of the ileocecal  valve and the appendiceal orifice.  The prep was good.  Within the base  of the cecum, extending to very near the appendiceal orifice, there was  a small cluster of polypoid adenomatous-appearing tissue, approximately  1 x 1.2 cm in diameter.  Part of it extended just to the rim of the  appendiceal orifice.  The argon plasma coagulator was used with a  straight fire probe to cauterize all visible portions of the of the  lesion, and this appeared to be complete. No tissue was removed for  histology.  The remainder of the cecum, ascending, transverse,  descending, and sigmoid colon all appeared normal, with no masses,  polyps, diverticula, or other mucosal abnormalities.  The scope was then  withdrawn, and the patient returned to the recovery room in stable  condition.  She tolerated the procedure well, and there were no  immediate  complications.   IMPRESSION:  Small amount of persistent polyp, fulgurated with the ER  argon plasma coagulator.   PLAN:  Repeat colonoscopy in 2 years.           ______________________________  Elyse Jarvis Amedeo Plenty, M.D.     JCH/MEDQ  D:  11/05/2006  T:  11/05/2006  Job:  [id number]   cc:   Wolfgang Phoenix. Oneida Alar, M.D.

## 2011-04-04 NOTE — H&P (Signed)
NAME:  Sara Graves               ACCOUNT NO.:  [id number]   MEDICAL RECORD NO.:  [id number]          PATIENT TYPE:  INP   LOCATION:  NA                           FACILITY:  [facility]   PHYSICIAN:  Tarri Glenn, M.D.  DATE OF BIRTH:  1941/06/22   DATE OF ADMISSION:  01/04/2007  DATE OF DISCHARGE:                              HISTORY & PHYSICAL   CHIEF COMPLAINT:  Pain in my back and right leg.   PRESENT ILLNESS:  This 70 year old female had been seen by [place] for  continued progressive problems concerning pain into her lumbar spine,  right hip and into the right lower extremity.  She has had pain in the  past which was intermittent.  We began her on Lyrica to help with the  pain into the right lower extremity, but she has had flare-ups which now  has been resistant to analgesics.  Lumbar myelogram and CT scan showed  stenosis from L2 to the sacrum.  Significant nerve root encroachment is  seen at all levels, particularly at L5-S1.  After much discussion with  her and her husband, including the risks and benefits of surgery by Dr.  Shellia Carwin, it was decided to go ahead with decompressive lumbar  laminectomy from L2 to sacrum.   PAST MEDICAL HISTORY:  The patient's family physician is Dr. Jolayne Nordmann of  [facility].  She has no medical allergies.   CURRENT MEDICATIONS:  1. Robaxin 500 mg as a muscle relaxant.  2. Vicodin for pain.  3. Byetta 10 mcg injectable b.i.d.  4. Lantus for type 1 diabetes.  5. Hyzaar 100 mg/25 mg daily.  6. Metoprolol 50 mg 1-1/2 tablet daily.  7. Lyrica 75 mg b.i.d.  8. Synthroid 50 mcg daily.  9. Simvastatin 40 mg nightly.   She is being treated for hypertension, hypothyroidism and diabetes.   PAST SURGICAL HISTORY:  1. Right shoulder surgery for repair of rotator cuff in the not too      distant past.  2. 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  3. Benign tumor removed right elbow.  4.  Hysterectomy.  5. Appendectomy.  6. She is also had bilateral bunionectomies and hammertoe corrections.   FAMILY HISTORY:  Positive for breast cancer.   SOCIAL HISTORY:  The patient is married, retired, no intake of alcohol  or tobacco products.   REVIEW OF SYSTEMS:  CNS:  No seizure disorder, paralysis, numbness or  double vision but the patient does have radicular pain down the right  lower extremity which consists of intermittent burning type pain,  tingling and occasional numbness in the right lateral leg and foot.  CARDIOVASCULAR:  No chest pain, no angina or orthopnea.  RESPIRATORY:  No productive cough, no hemoptysis or shortness of breath.  GASTROINTESTINAL:  No nausea, vomiting, melena or bloody stools.  GENITOURINARY:  No discharge, dysuria or hematuria.  MUSCULOSKELETAL:  Primarily in present illness.   PHYSICAL EXAMINATION:  GENERAL APPEARANCE:  Alert and cooperative,  friendly, slightly obese 70 year old female.  VITAL SIGNS:  Blood pressure 110/62,  pulse 72, respirations 12.  HEENT: Normocephalic, PERRLA.  Wears glasses.  Oropharynx is clear.  NECK:  Supple.  No lymphadenopathy.  CHEST:  Clear to auscultation, no rhonchi, no rales, no wheezes.  HEART:  Regular rate and rhythm.  No murmurs are heard.  ABDOMEN:  Soft, nontender, liver and spleen not felt.  Obese.  GENITALIA/PELVIC/BREASTS:  Not done and not pertinent to present  illness.  EXTREMITIES:  Straight leg raising bilaterally is negative.  She does  have some sensory deficit on the right lateral thigh today.   ADMISSION DIAGNOSES:  1. Spinal stenosis L2-S1.  2. Diabetes.  3. Hypertension.  4. Hypothyroidism.   PLAN:  The patient will be admitted for central and lateral  decompressive lumbar laminectomy from L2-S1.  She will have a 2-3  hospital stay, will ask physical therapy to assist her in back  precautions and she may need home health, preferably with [place].      Sara Graves.     ______________________________  Tarri Glenn, M.D.    DLU/MEDQ  D:  12/31/2006  T:  12/31/2006  Job:  [id number]

## 2011-04-04 NOTE — Op Note (Signed)
NAME:  Sara Graves, Sara Graves               ACCOUNT NO.:  [id number]   MEDICAL RECORD NO.:  [id number]          PATIENT TYPE:  INP   LOCATION:  1511                         [facility]ACILITY:  [facility]   PHYSICIAN:  Tarri Glenn, M.D.  DATE OF BIRTH:  [birth year]   DATE OF PROCEDURE:  01/04/2007  DATE OF DISCHARGE:                               OPERATIVE REPORT   PREOPERATIVE DIAGNOSIS:  Spinal stenosis L2-3, L3-4, L4-5, L5-S1.   POSTOPERATIVE DIAGNOSIS:  Spinal stenosis L2-3, L3-4, L4-5, L5-S1.   OPERATIONS:  Central lateral recess and foraminal decompression at L2 to  the sacrum.   SURGEON:  Tarri Glenn, M.D.   ASSISTANT:  Kipp Brood. Gladstone Lighter, M.D.   ANESTHESIA:  General.   PATHOLOGY AND JUSTIFICATION FOR PROCEDURE:  She has had some rather  longstanding back and right leg pain primarily although some pain on the  left lower extremity.  Lyrica has provided with slight relief.  Myelogram CT scan shows abnormalities either central, lateral recess or  foraminal stenosis at the level from L2-3 to the sacrum, main stenosis  was at L4-5.  There was no true disk herniation with only broad-based  protrusion at L3-4, she is here today for the above-mentioned surgery,  consequently.   PROCEDURE:  Preoperative antibiotics, satisfactory general anesthesia,  placed in the prone position on the Wilson frame.  She has had a prior  right knee replacement and we protected this.  Back was prepped with  DuraPrep, but draped in sterile field IV employed, made a vertical  incision working in the midline and then the mid lumbar spine, with a  lateral x-ray performed showing the hemostats were all in the spinous  processes of L4 and L2.  Based on this continued the dissection distally  and soft tissue was dissected off the neural arches from L2-3 to the  sacrum and two self-retaining retractors placed.  First with double-  action rongeurs, we removed the spinous processes of L3, L4 and L5 as  well as a portion  of the neural arches then progressed with the  decompression with 2 and 3 mm Kerrison rongeurs working first midline  and laterally removing bone and ligamentum flavum.  When the dissection  became more difficult, we brought in the microscope and completed the  decompression using it.  We worked removing all bone and ligamentum  flavum which might be compressing the spinal canal or the spinal nerves  working from L2-3 to the sacrum.  All foramina were patent to hockey  stick at the conclusion.  Wound was then irrigated with sterile saline  and Gelfoam soaked in thrombin was placed over the dura and then placed  a quarter inch Penrose drain and under direct visualization, closed the  wound around the drain with multiple interrupted #1 Vicryl in the  paralumbar  muscle fascia, 2-0 Vicryl subcutaneous tissue and staples in the skin.  Betadine Adaptic dry sterile dressing were applied.  She tolerated the  procedure well and was taken to the recovery room in satisfactory  condition with no known complications.  Estimated blood loss was 500 mL,  no blood  replacement.           ______________________________  Tarri Glenn, M.D.     JA/MEDQ  D:  01/04/2007  T:  01/05/2007  Job:  [id number]

## 2011-04-04 NOTE — Op Note (Signed)
[facility]  Patient:    Sara Graves, Sara Graves                      MRN: [id number] Proc. Date: 12/07/00 Adm. Date:  [id number] Attending:  Rafael Bihari CC:         Family Practice Cl   Operative Report  PROCEDURE PERFORMED:  Colonoscopy.  ENDOSCOPIST:  Elyse Jarvis. Amedeo Plenty, M.D.  DESCRIPTION OF PROCEDURE:  The patient was placed in the left lateral decubitus position and placed on the pulse monitor with continuous low flow oxygen delivered by nasal cannula.  She was sedated with 60 mg IV Demerol and 6 mg IV Versed.  The Olympus video colonoscope was inserted into the rectum and advanced to the cecum, confirmed by transillumination of McBurneys point and visualization of the ileocecal valve and appendiceal orifice.  The prep was excellent.  The cecum, ascending, transverse and descending colon all appeared normal with no masses, polyps, diverticula or other mucosal abnormalities.  In the sigmoid colon there were seen several scattered diverticula and no other abnormalities. The rectum appeared normal and retroflex view of the anus revealed no obvious internal hemorrhoids. The colonoscope was then withdrawn and the patient returned to the recovery room in stable condition.  The patient tolerated the procedure well and there were no immediate complications.  IMPRESSION:  Small cecal polyp.  PLAN:  Await histology and will probably pursue repeat colonoscopy in five years. DD:  12/07/00 TD:  12/07/00 Job: [id number]

## 2011-04-04 NOTE — Op Note (Signed)
NAME:  Sara Graves, Sara Graves                         ACCOUNT NO.:  [id number]   MEDICAL RECORD NO.:  [id number]                   PATIENT TYPE:  AMB   LOCATION:  DAY                                  FACILITY:  [facility]   PHYSICIAN:  Tarri Glenn, M.D.               DATE OF BIRTH:  [birth year]   DATE OF PROCEDURE:  03/28/2003  DATE OF DISCHARGE:                                 OPERATIVE REPORT   PREOPERATIVE DIAGNOSES:  1. Degenerative arthritis, acromioclavicular joint.  2. Small rotator cuff tear, right shoulder.   POSTOPERATIVE DIAGNOSES:  1. Degenerative arthritis, acromioclavicular joint.  2. Substantial full-thickness rotator cuff tear with retraction, right     shoulder.   OPERATION:  1. Right shoulder arthroscopy with debridement of intra-articular tissue and     determination of severity of rotator cuff tear.  2. Open anterior acromionectomy repair of torn rotator cuff.  3. Open distal clavicle resection, right shoulder.   SURGEON:  Tarri Glenn, M.D.   ASSISTANT:  Pedro Earls, P.A.-C.   ANESTHESIA:  General.   INDICATIONS FOR PROCEDURE:  The patient injured her right shoulder when she  fell down on 01/10/2003.  She had degenerative changes of the [facility] joint with  a type 2 acromion on plain x-rays.  An MRI done on 02/20/2003 demonstrated  no labral or biceps tendon pathology, the biceps tendon being well  visualized.  There did appear to be a small full-thickness rotator cuff tear  without retraction.  It was felt that perhaps we could perform rotator cuff  surgery arthroscopically but as noted below, the extent of the tear was much  larger than was depicted on the MRI, leading to open repair of it.   PROCEDURE:  Prophylactic antibiotics.  Satisfactory general anesthesia.  Beach chair position on the __________  frame.  Right shoulder was prepped  with Duraprep and draped in a sterile field.  Anatomy of the shoulder joint  was marked out including possible incision  for the rotator cuff repair, as  well as incision for the distal clavicle resection.  She had a preoperative  interscalene block and adrenalin was used in the irrigant fluid and did not  use Marcaine with adrenalin because of this.  Through a posterior soft spot  portal, I atraumatically entered the glenohumeral joint with difficulty  making out the anatomy because of a good bit of fragmentation of tissues.  I  was unable to see the biceps tendon which was not visualized proximally on  the MRI, although it was distally.  Her arm was quite large and I was unable  to tell for certain whether or not she was leaking any fluid into the  subacromial area.  Because of all this, I advanced the scope to the anterior  capsule and used a switching stick and made an anterior incision.  Over this  was placed a metal cannula followed by a 4.2 shaver  and began debriding down  the anterior shoulder joint.  After clearing away a lot of reactive tissue,  it became apparent that she actually had a fairly substantial rotator cuff  tear and I was actually debriding some of the ends of the rotator cuff.  Its  normal attachment to the humeral head was noted.  Accordingly, I made the  decision that we should not only performed open resection of the distal  clavicle, but open repair of the rotator cuff tear.  I made a vertical  incision over the distal clavicle, standing down laterally until I went  slightly past the palpable acromion.  She had an unusually shaped distal  clavicle with a bow to the chest and very prominent bone at the [facility] joint  itself.  I measured out 1.5 cm from the [facility] joint and marked the clavicle at  this point and then undermined the clavicle with two Hohmann retractors and  then used the micro saw to cut the clavicle at this point.  The distal 1.5  cm was removed with towel clip and cautery technique.  One small spicule of  bone on the undersurface of the residual clavicle was trimmed out with  small  rongeurs and the raw bone was then covered with bone wax.  I then continued  my incision to the fascia and periosteum and my skin incision, dissecting  out the anterior acromion.  She had a large anterior acromion present.  We  then undermined it with Cobb elevators and protected the underlying rotator  cuff tissue, made my initial anterior acromionectomy with a micro saw, and I  then also performed some inferior acromionectomy to completely relieve her  impingement.  We then visualized the rotator cuff tear which was slightly  more than 2 cm in width with about 1 to 2 cm of retraction.  The rotator  cuff was flexible and I was able to bring it back to its normal resting  place at the greater tuberosity retraction.  I roughened up the bone at this  point and then placed two super Mitek anchors and was able to stabilize the  advanced rotator cuff with the arm slightly abducted.  I supplemented these  with some individual #1 Ethibond suture to the lateral edge, and brought the  arm down to the side and it was stable.  I checked and there was no residual  impingement.  I placed Gelfoam in the gap left by the distal clavicle  resection and then closed the wound with interrupted #1 Vicryl in the small  slit of the deltoid and the fascia over the anterior acromion and the  residual clavicle.  Subcutaneous tissue was closed with 2-0 Vicryl and the  skin with Steri-Strips with 4-0 nylon placed in the two portals.  A dry  sterile dressing and shoulder immobilizer applied.  She tolerated the  procedure well and was taken to recovery room in satisfactory condition.  There were no complications.                                               Tarri Glenn, M.D.    JA/MEDQ  D:  03/28/2003  T:  03/29/2003  Job:  [id number]

## 2011-04-04 NOTE — Consult Note (Signed)
[facility]  Patient:    Sara Graves, Sara Graves                      MRN: [id number] Proc. Date: 05/08/00 Adm. Date:  [id number] Attending:  Tarri Glenn Page CC:         Laurice Record. Aplington, M.D.                          Consultation Report  HISTORY OF PRESENT ILLNESS:  The patient is a very pleasant 70 year old woman whom I have been asked to see for hypoxemia after her right total knee replacement. The patient was admitted on May 06, 2000, and underwent a total knee replacement. She did fairly well, with an uncomplicated course until yesterday, when she began to have a slightly diminished blood pressure, and also falling O2 saturations. The patient was treated with supplemental oxygen with adequate response; however, continued to be somewhat hypoxic today, and therefore raised concern.  She underwent a spiral a spiral CT scan, which showed no acute process in the lungs and no evidence for pulmonary emboli.  Her chest x-ray today is free of acute process. Her electrocardiogram is also unremarkable, and she denies any cough, sputum production, or chest pain of any type.  The patient has no known prior pulmonary history, nor does she have asthma or any reactive airway disease during her childhood or young adulthood.  The patient is mildly anemic, but not severely so. Currently she is resting quite comfortably on 2 L of oxygen with a saturation running 95%-96%.  She does not appear dyspneic, nor does she appear tachypneic.  PAST MEDICAL HISTORY:  1. Significant for type 2 diabetes mellitus.  2. History of hypercholesterolemia,.  3. History of hypertension.  4. History of allergic rhinitis.  5. History of irritable bowel syndrome.  6. History of osteoarthritis.  7. History of hypothyroidism.  8. Status post appendectomy.  9. Status post bladder neck suspension. 10. Status post chronic polyp removal.  ALLERGIES:  The patient has cramps and  diarrhea from GLUCOPHAGE, otherwise she as no known drug allergies.  SOCIAL HISTORY:  She has never smoked or does she drink alcohol.  She is married with two children.  FAMILY HISTORY:  Remarkable for her father having prostate cancer and her mother having breast cancer.  REVIEW OF SYSTEMS:  As per the history of present illness.  See the hospital chart.  PHYSICAL EXAMINATION:  GENERAL:  She is a moderately-obese white female, in no acute distress.  VITAL SIGNS:  Blood pressure 123456 systolic, T-Max [id number] degrees, O2 saturation on 2 L is 95%-96%.  HEENT:  Pupils equal, round, reactive to light and accommodation.  Extraocular muscles are intact.  Nares patent without discharge.  Oropharynx clear.  NECK:  Supple without jugular venous distention or lymphadenopathy.  There is no palpable thyromegaly.  CHEST:  Surprisingly is totally clear to auscultation.  CARDIAC:  Reveals a regular rate and rhythm with a 2/6 systolic murmur.  ABDOMEN:  Soft, nontender, with good bowel sounds.  GENITOURINARY/RECTAL/BREASTS:  Examinations not done, not indicated.  EXTREMITIES:  Lower extremities show her right lower extremity being wrapped in a splint, but the foot is warm, with good capillary refill.  The left lower extremity shows no significant edema, with good pulses distally, and no calf tenderness.  NEUROLOGIC:  The patient is alert and oriented x 3, with no obvious motor deficits.  LABORATORY DATA:  A  chest x-ray was clear, as was the CT scan of the chest.  Electrocardiogram was without acute process.  IMPRESSION:  Acute hypoxemia after a total knee replacement, probably secondary to atelectasis with Bq mismatching.  The spiral CT does not exclude small peripheral pulmonary emboli, and the V/Q scan would be a much better test with her normal chest x-ray; however, the patient by history and examination really has nothing to suggest a pulmonary emboli.  She denies any  dyspnea. She is not tachycardic or tachypneic and her saturations easily increase to 100% during the deep breathing with auscultation of her chest.  RECOMMENDATIONS:  I really think that with mobilization and continued use of incentive spirometry that we will see things begin to improve.  SUGGESTIONS: 1. Out of bed and push incentive spirometry. 2. If the patient fails to respond to this with adequate oxygen saturations,    will check a V/Q scan. DD:  05/08/00 TD:  05/09/00 Job: [id number]

## 2011-04-04 NOTE — Discharge Summary (Signed)
[facility]  Patient:    Sara Graves, Sara Graves Visit Number: [id number] MRN: [id number]          Service Type: MED Location: [phone] Attending Physician:  Schuyler Amor Dictated by:   Dan Maker, M.D. Adm. Date:  07/01/2001 Disc. Date: 07/05/2001                             Discharge Summary  DATE OF BIRTH:  [birth year]  DISCHARGE DIAGNOSES: 1. Urinary tract infection. 2. Pneumonia. 3. Constipation. 4. Diabetes mellitus type 2. 5. Hypertension. 6. Hypothyroidism. 7. Hypercholesterolemia. 8. Hypokalemia. 9. Hyponatremia.  DISCHARGE MEDICATIONS: 1. Hydrochlorothiazide 25 mg 1 p.o. q.d. 2. Norvasc 5 mg 1 p.o. q.d. 3. Cozaar 100 mg 1 p.o. q.d. 4. Avandia 8 mg 1 p.o. q.d. 5. Glucotrol XL 10 mg 1 p.o. q.d. 6. Pravastatin 40 mg 1 p.o. q.h.s. 7. Aspirin 81 mg 1 p.o. q.d. 8. Synthroid 0.05 mg 1 p.o. q.d. 9. Tequin 400 mg 1 p.o. q.d. x 6 days.  HISTORY OF PRESENT ILLNESS:  Please see full H&P for details.  Briefly, the patient is a 70 year old white female who presented to [facility] with fever, chills, and constipation for four days.  The patient also reported a decreased appetite and a 5-pound weight loss in the last month.  Initial exam findings were unremarkable.  ADMISSION LABORATORY DATA:  WBC 6.3, hemoglobin 11.7, hematocrit 32.9, platelets 129.  Sodium 129, potassium 3.6, chloride 94, bicarbonate 27, BUN 20, creatinine 1.0, glucose 276, calcium 8.5, albumin 3.2, alkaline phosphatase 43, total protein 7.1, AST 24, ALT 23, total bilirubin 1.5. Urinalysis revealed large amount leukocyte esterase, few bacteria, 21-50 wbcs, negative nitrite, negative protein.  Chest x-ray on July 01, 2001, showed progressive consolidation of the right lung involving the right upper lobe and right middle lobe, with suspected associated right hilar adenopathy.  HOSPITAL COURSE: #1 - URINARY TRACT INFECTION:  Two urine cultures were  obtained on July 01, 2001.  The first grew 40,000 colonies of group B strep.  The second culture showed no growth.  The patient was started on IV Tequin, and this was continued until day #4 of hospitalization when she was switched to oral Tequin.  The patient was asymptomatic and afebrile by the day of discharge.  #2 - RIGHT MIDDLE LOBE PNEUMONIA:  The patient was started on IV Tequin and was continued on this until day #4 of hospitalization.  She was then switched to oral Tequin.  The patient improved clinically during hospitalization and, by the day of discharge, the patient was afebrile with no shortness of breath and O2 saturation of 95% on room air.  #3 - CONSTIPATION:  The patient received laxative of choice during hospitalization.  By the day of discharge she was much improved and reported regular bowel movements.  #4 - HYPOKALEMIA:  The patient was noted to have potassium as low as 3.1 during hospitalization.  She was given oral potassium and, by the day of discharge, hypokalemia had resolved.  #5 - HYPONATREMIA:  The patient initially presented with sodium of 129.  She was followed closely during hospitalization and, by the day of discharge, hyponatremia had resolved.  #6 - HYPERTENSION:  The patient was maintained on her home regimen of hydrochlorothiazide, Norvasc, and Cozaar.  Blood pressure remained stable during hospitalization.  #7 - DIABETES MELLITUS TYPE 2:  The patient was hyperglycemic on initial presentation.  CBGs were closely followed, and the patient was maintained on Avandia and Glucotrol XL.  By the day of discharge she was well controlled.  #8 - HYPERCHOLESTEROLEMIA:  The patient received Zocor 20 mg daily during hospitalization.  #9 - HYPOTHYROIDISM:  A TSH was checked on admission and was 0.535.  The patient was continued on her home regimen of Synthroid 0.05 mg daily.  DISPOSITION/CONDITION ON DISCHARGE:  The patient was discharged home in  stable condition.  DISCHARGE INSTRUCTIONS:  She received instructions to return to work on Thursday, July 08, 2001.  She is to continue Tequin 400 mg daily for six more days.  FOLLOW-UP:  The patient has an appointment with Dr. Graylon Good in two weeks.  She will need a repeat chest x-ray in four to six weeks to rule out obstructing process. Dictated by:   Dan Maker, M.D. Attending Physician:  Schuyler Amor DD:  07/05/01 TD:  07/06/01 Job: [id number]

## 2011-04-04 NOTE — Op Note (Signed)
NAME:  Sara Graves, Sara Graves               ACCOUNT NO.:  [id number]   MEDICAL RECORD NO.:  [id number]          PATIENT TYPE:  AMB   LOCATION:  ENDO                         FACILITY:  [facility]   PHYSICIAN:  John C. Amedeo Plenty, M.D.    DATE OF BIRTH:  07-Sep-1941   DATE OF PROCEDURE:  04/22/2006  DATE OF DISCHARGE:                                 OPERATIVE REPORT   INDICATIONS FOR PROCEDURE:  Cecal polyp incompletely removed on previous  colonoscopy two months ago.   PROCEDURE:  The patient was placed in the left lateral decubitus position  and placed on the pulse monitor with continuous low-flow oxygen delivered by  nasal cannula.  She was sedated with 100 mcg IV fentanyl and 7.5 mg IV  Versed.  Olympus video colonoscope was inserted into the rectum and advanced  to cecum, confirmed by transillumination of McBurney's point and  visualization of ileocecal valve and appendiceal orifice.  Prep was  excellent.  There was a sessile multilobulated polyp approximately 1.4 x 1.4  cm in the cecum as seen before.  I could not easily ensnare any of it due to  its flat nature and decided after two or three unsuccessful attempts,  decided to inject around with saline which did result in significant lift of  the polyp.  I was then able to piecemeal snare about two thirds of the polyp  in two resections.  I could not this get the snare around the remaining  polyp and then used the argon plasma coagulator to fulgurate as much of the  remaining viable polyp as possible.  It was not clear whether the polyp was  completely destroyed and it was felt that another inspection would be  necessary in the future.  The remainder of the cecum, ascending, transverse,  descending and sigmoid colon as well as the rectum appeared normal with no  further polyps, diverticula, masses or other abnormalities.  The scope was  then withdrawn.  The patient returned to the recovery room in stable  condition.  She tolerated the procedure well  and there were no immediate  complications.   IMPRESSION:  Cecal polyp removed piecemeal and with argon plasma coagulator  as described above.   PLAN:  Will recommend repeat inspection and possible further fulguration of  any residual polyp in 6 months.           ______________________________  Elyse Jarvis. Amedeo Plenty, M.D.     JCH/MEDQ  D:  04/22/2006  T:  04/22/2006  Job:  [id number]   cc:   Wolfgang Phoenix. Oneida Alar, M.D.  Fax: [phone]

## 2011-04-04 NOTE — H&P (Signed)
[facility]  Patient:    Sara Graves, Sara Graves                        MRN: [id number] Adm. Date:  04/29/00 Attending:  Jeneen Rinks P. Aplington, M.D. Dictator:   Jenetta Loges, P.A. CC:         Linda Hedges, M.D. - [id number] [place], [place]                         History and Physical  DATE OF BIRTH:  Nov 12, 1941  CHIEF COMPLAINT:  Right knee pain.  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  She has undergone an arthroscopy and has documented end-stage osteoarthritis.  She is now having difficulty ambulating and having difficulty ith pain at rest.  At this point she wishes to proceed with a total knee arthroplasty. The risks and benefits are discussed with the patient in great detail, and she wishes to proceed.  She has donated 2 units of autologous blood in preparation or the procedure.  She also saw her family medical physician, Dr. Linda Hedges at  [facility], and received medical clearance.  PAST MEDICAL HISTORY: 1. Diabetes mellitus type 2. 2. Hypercholesterolemia. 3. Hypertension. 4. Allergic rhinitis. 5. History of irritable bowel syndrome. 6. Osteoarthritis. 7. Hypothyroidism.  PAST SURGICAL HISTORY: 1. Hysterectomy. 2. Tonsillectomy. 3. Appendectomy. 4. Bladder neck suspension. 5. Removal of colonic polyps. 6. Right elbow surgery. 7. Removal of lipoma. 8. Right knee arthroscopy.  SOCIAL HISTORY:  The patient does not smoke or drink.  She lives in a one-story  home with a ramp entrance into the home.  She is married, with two children, her oldest who has muscular dystrophy.  The house is wheelchair-accessible.  She will have her husband and other son available for postoperative care.  ALLERGIES:  No known drug allergies.  GLUCOPHAGE has caused cramps and diarrhea.  CURRENT  MEDICATIONS: 1. Synthroid 0.05 mg q.d. 2. Claritin 10 mg q.d. 3. Avandia 4 mg q.d. 4. Norvasc 5 mg q.d. 5. Hydrochlorothiazide 25 mg q.d. 6. Cozaar 100 mg q.d. 7. Pravachol 40 mg q.d. 8. On the medical clearance from the physicians office it has her listed    as taking Glucotrol 10 mg q.d.; however, the patient does not have that    listed.  We will need to confirm that.  She recently stopped Celebrex.  FAMILY HISTORY:  Mother and grandmother with breast cancer.  Father with prostate cancer.  REVIEW OF SYSTEMS:  The patient denies any recent fevers, chills, night sweats. No bleeding tendencies.  CNS:  No blurred or double vision, seizures, paralysis. RESPIRATORY:  No shortness of breath, productive cough, hemoptysis. CARDIOVASCULAR:  No chest pain, angina, or orthopnea.  GI:  No nausea, vomiting, constipation, melena, or bloody stools.  GENITOURINARY:  No dysuria or hematuria. She has had a recent treatment for a vaginal yeast infection.  PHYSICAL EXAMINATION:  GENERAL:  A well-developed, well-nourished 70 year old female, in no acute distress.  She ambulates with an antalgic gait.  VITAL SIGNS:  Blood pressure today on examination 136/72, pulse 72, respirations 12.  HEENT:  Normocephalic.  Extraocular motions intact.  NECK:  Supple. There is a little bit of an enlargement of the thyroid noted. No carotid bruits appreciated.  CHEST:  Clear to auscultation  bilaterally.  No rales or rhonchi.  HEART:  A regular rate and rhythm.  No murmurs, gallops, rubs, heaves, or thrills.  ABDOMEN:  Positive bowel sounds, soft, nontender.  EXTREMITIES:  There are some superficial varicosities noted.  She is neurovascularly intact, bilateral lower extremities.  No pitting edema is noted. Knee examination shows crepitus with range of motion.  She has pain along both joint lines.  She has a mild effusion.  IMPRESSION: 1. 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 2.  Non-insulin-dependent diabetes mellitus. 3. Hypercholesterolemia. 4. Hypertension. 5. Allergic rhinitis. 6. Irritable bowel syndrome. 7. Hypothyroidism.  PLAN:  The patient will be admitted for a total knee arthroplasty.  She does have 2 units of autologous blood.  She should not require a rehabilitation stay.  She oes have home health. DD:  04/29/00 TD:  04/29/00 Job: [id number]

## 2011-04-18 ENCOUNTER — Ambulatory Visit (INDEPENDENT_AMBULATORY_CARE_PROVIDER_SITE_OTHER): Payer: Medicare Other | Admitting: Pharmacist

## 2011-04-18 DIAGNOSIS — E119 Type 2 diabetes mellitus without complications: Secondary | ICD-10-CM

## 2011-04-18 NOTE — Progress Notes (Signed)
  Subjective:    [name], female    DOB: December 26, 1940, 70 y.o.   MRN: [id number]  HPI Patient arrived in good spirits, continue to have multiple stress factors at home. Her medication adherence is good, and she keep her glucose monitoring log. She check her blood glucose 3-4 time a day.    Review of Systems     Objective:   Physical Exam CBG: 100-200 with post prandials > 150. And fasting 100-130       Assessment & Plan:  Most recent A1c = 9.4 (May 14th) A: Long standing diabetes on multiple injection medications, currently poorly controlled due to stress.  P: Adjust Lantus to 55 units BID, and increase Novolog to 40 units with breakfast, 30 units with lunch and 40 units with evening meal, f/u pharmacy clinic in 4-6 weeks.  Total time: 25 mins  See with: Maryanna Shape, Pharmacy resident

## 2011-04-18 NOTE — Assessment & Plan Note (Signed)
Most recent A1c = 9.4 (May 14th) A: Long standing diabetes on multiple injection medications, currently poorly controlled due to stress.  P: Adjust Lantus to 55 units BID, and increase Novolog to 40 units with breakfast, 30 units with lunch and 40 units with evening meal, f/u pharmacy clinic in 4-6 weeks.  Total time: 25 mins  See with: Maryanna Shape, Pharmacy resident

## 2011-04-18 NOTE — Patient Instructions (Signed)
1)  Change Lantus to 55 units twice daily 2)  Change Novolog Flex Pen to 40 units prior to Breakfast, 30 units prior to Lunch, and 40 units prior to Evening Meal.  Please make sure you take a little time for yourself in your busy day!

## 2011-04-21 NOTE — Progress Notes (Signed)
  Subjective:    [name], female    DOB: January 23, 1941, 70 y.o.   MRN: [id number]  HPI Reviewed and agree with Dr. Graylin Shiver management.    Review of Systems     Objective:   Physical Exam        Assessment & Plan:

## 2011-05-02 ENCOUNTER — Other Ambulatory Visit: Payer: Self-pay | Admitting: Family Medicine

## 2011-05-02 DIAGNOSIS — Z1231 Encounter for screening mammogram for malignant neoplasm of breast: Secondary | ICD-10-CM

## 2011-05-14 ENCOUNTER — Ambulatory Visit
Admission: RE | Admit: 2011-05-14 | Discharge: 2011-05-14 | Disposition: A | Discharge status: Home / Self Care | Payer: Medicare Other | Source: Ambulatory Visit | Attending: Family Medicine | Admitting: Family Medicine

## 2011-05-14 DIAGNOSIS — Z1231 Encounter for screening mammogram for malignant neoplasm of breast: Secondary | ICD-10-CM

## 2011-05-16 ENCOUNTER — Ambulatory Visit (INDEPENDENT_AMBULATORY_CARE_PROVIDER_SITE_OTHER): Payer: Medicare Other | Admitting: Pharmacist

## 2011-05-16 ENCOUNTER — Encounter: Payer: Self-pay | Admitting: Pharmacist

## 2011-05-16 DIAGNOSIS — I10 Essential (primary) hypertension: Secondary | ICD-10-CM

## 2011-05-16 DIAGNOSIS — E119 Type 2 diabetes mellitus without complications: Secondary | ICD-10-CM

## 2011-05-16 NOTE — Progress Notes (Signed)
  Subjective:    [name], female    DOB: 09-Sep-1941, 70 y.o.   MRN: [id number]  HPI  Reviewed and agree with Dr. Graylin Shiver management.    Review of Systems     Objective:   Physical Exam        Assessment & Plan:

## 2011-05-16 NOTE — Assessment & Plan Note (Signed)
Improved control of blood glucose readings most likely related to adherence and decreased PO intake.  Denies symptoms of hypoglycemia despite recorded readings of 70 and 75.   Will decrease her Lantus from 55 BID to 50 units BID and continue same dose of Novolog at this time.   Next visit with Dr Verlee Rossetti.  Patient will begin checking two hour post prandials, bedtime and even early AM readings to see if we are missing late post prandial hyperglycemia.  I do NOT think this is happening based on the current novolog to lantus ratio.   I believe her next A1 C will be improved.   TTFFC 25 minutes.

## 2011-05-16 NOTE — Patient Instructions (Signed)
Change Lantus to 50 units twice daily.  Check blood glucose. AM and then two other times during the day.  Including two hours after lunch and dinner and even late at night.  Next visit with Dr. Verlee Rossetti in August.

## 2011-05-16 NOTE — Progress Notes (Signed)
  Subjective:    [name], female    DOB: June 12, 1941, 70 y.o.   MRN: [id number]  HPI Patient arrives in good spirits today.  She states she has been taking more time for herself.  Her home life remains a challenge with taking care of her son, husband and mother.   She admits talking to her mother on the phone AND falling asleep while she is "talking on the phone".   Blood glucose log reveals all fasting and pre-prandial blood glucose readings < 200 and mostly <150    Fasting CBGs have been 70-115 Pre-dinner CBGs have been 75 -150 She believe the low numbers may be due to her feeling less hungry lately.   Her weight is unchanged.    Review of Systems     Objective:   Physical Exam        Assessment & Plan:  Diabetes: Improved control of blood glucose readings most likely related to adherence and decreased PO intake.  Denies symptoms of hypoglycemia despite recorded readings of 70 and 75.   Will decrease her Lantus from 55 BID to 50 units BID and continue same dose of Novolog at this time.   Next visit with Dr Sara Graves.  Patient will begin checking two hour post prandials, bedtime and even early AM readings to see if we are missing late post prandial hyperglycemia.  I do NOT think this is happening based on the current novolog to lantus ratio.   I believe her next A1 C will be improved.   TTFFC 25 minutes.   HTN: Manual BP 112/64 may warrant dose reduction of BP pills - specifically half dose of Hyzaar if this BP is repeated.  She is currently NOT experiencing any symptoms of orthostasis.

## 2011-05-16 NOTE — Assessment & Plan Note (Signed)
Manual BP 112/64 may warrant dose reduction of BP pills - specifically half dose of Hyzaar if this BP is repeated.  She is currently NOT experiencing any symptoms of orthostasis.

## 2011-06-08 ENCOUNTER — Other Ambulatory Visit: Payer: Self-pay | Admitting: Family Medicine

## 2011-06-08 NOTE — Telephone Encounter (Signed)
Refill request

## 2011-07-02 ENCOUNTER — Other Ambulatory Visit: Payer: Self-pay | Admitting: Family Medicine

## 2011-07-02 NOTE — Telephone Encounter (Signed)
Refill request

## 2011-08-15 LAB — GLUCOSE, CAPILLARY
Glucose-Capillary: 107 — ABNORMAL HIGH
Glucose-Capillary: 67 — ABNORMAL LOW
Glucose-Capillary: 78
Glucose-Capillary: 89
Glucose-Capillary: 90

## 2011-08-15 LAB — BASIC METABOLIC PANEL
BUN: 17
BUN: 19
CO2: 24
CO2: 26
Chloride: 107
Creatinine, Ser: 1.09
Glucose, Bld: 44 — ABNORMAL LOW
Glucose, Bld: 99
Potassium: 3.5
Sodium: 140

## 2011-08-15 LAB — CBC
Platelets: 105 — ABNORMAL LOW
RBC: 3.47 — ABNORMAL LOW
WBC: 6.9

## 2011-09-05 ENCOUNTER — Other Ambulatory Visit: Payer: Self-pay | Admitting: Family Medicine

## 2011-09-05 NOTE — Telephone Encounter (Signed)
Refill request

## 2011-09-08 ENCOUNTER — Telehealth: Payer: Self-pay | Admitting: Family Medicine

## 2011-09-08 NOTE — Telephone Encounter (Signed)
Looked up front and did not locate a request for hanicap place card or if one had been signed for her. Forwarded to pcp

## 2011-09-08 NOTE — Telephone Encounter (Signed)
Sara Graves is calling about the application for the handicap place card that she brought in last Thursday.  She wants to know if it is ready for her to pick up.  She needs it before tomorrow so she won't have to pay a ticket for using an expired one.

## 2011-09-09 NOTE — Telephone Encounter (Signed)
Pt informed.Sara Graves  

## 2011-09-09 NOTE — Telephone Encounter (Signed)
Please notify her its ready. Thanks!

## 2011-09-09 NOTE — Telephone Encounter (Signed)
I have not seen a form, but I haven't looked in my box today. If it's not in my box, then I don't have it.

## 2011-09-09 NOTE — Telephone Encounter (Signed)
I found the paperwork and completed. OK for 2 mo temporary handicap sticker so that I may evaluate her needs.

## 2011-09-16 ENCOUNTER — Ambulatory Visit (INDEPENDENT_AMBULATORY_CARE_PROVIDER_SITE_OTHER): Payer: Medicare Other | Admitting: Family Medicine

## 2011-09-16 VITALS — BP 165/95 | HR 74 | Temp 97.9°F

## 2011-09-16 DIAGNOSIS — F329 Major depressive disorder, single episode, unspecified: Secondary | ICD-10-CM

## 2011-09-16 DIAGNOSIS — E7800 Pure hypercholesterolemia, unspecified: Secondary | ICD-10-CM

## 2011-09-16 DIAGNOSIS — E119 Type 2 diabetes mellitus without complications: Secondary | ICD-10-CM

## 2011-09-16 DIAGNOSIS — E1142 Type 2 diabetes mellitus with diabetic polyneuropathy: Secondary | ICD-10-CM

## 2011-09-16 DIAGNOSIS — E1140 Type 2 diabetes mellitus with diabetic neuropathy, unspecified: Secondary | ICD-10-CM

## 2011-09-16 DIAGNOSIS — I10 Essential (primary) hypertension: Secondary | ICD-10-CM

## 2011-09-16 DIAGNOSIS — E039 Hypothyroidism, unspecified: Secondary | ICD-10-CM

## 2011-09-16 DIAGNOSIS — E1149 Type 2 diabetes mellitus with other diabetic neurological complication: Secondary | ICD-10-CM

## 2011-09-16 LAB — CBC
HCT: 42.1 % (ref 36.0–46.0)
Hemoglobin: 15.3 g/dL — ABNORMAL HIGH (ref 12.0–15.0)
MCH: 33.5 pg (ref 26.0–34.0)
MCHC: 36.3 g/dL — ABNORMAL HIGH (ref 30.0–36.0)
MCV: 92.1 fL (ref 78.0–100.0)
Platelets: 139 K/uL — ABNORMAL LOW (ref 150–400)
RBC: 4.57 MIL/uL (ref 3.87–5.11)
RDW: 12.7 % (ref 11.5–15.5)
WBC: 5.7 K/uL (ref 4.0–10.5)

## 2011-09-16 LAB — TSH: TSH: 1.961 u[IU]/mL (ref 0.350–4.500)

## 2011-09-16 LAB — BASIC METABOLIC PANEL
BUN: 15 mg/dL (ref 6–23)
Calcium: 9.7 mg/dL (ref 8.4–10.5)
Potassium: 3.4 mEq/L — ABNORMAL LOW (ref 3.5–5.3)
Sodium: 139 mEq/L (ref 135–145)

## 2011-09-16 LAB — POCT GLYCOSYLATED HEMOGLOBIN (HGB A1C): Hemoglobin A1C: >14

## 2011-09-16 MED ORDER — CLONIDINE HCL 0.1 MG PO TABS: 0.2000 mg | ORAL_TABLET | Freq: Two times a day (BID) | ORAL | Status: DC

## 2011-09-16 NOTE — Patient Instructions (Signed)
Make time to give yourself insulin in same doses. Increase your clonidine to two tablets twice a day. I will call you with lab results.  Make an appointment with Dr. Valentina Lucks and Dr. Verlee Rossetti in one month. Drink plenty of fluids. Keep your feet protected and call if you have any sores.  Diabetes and Foot Care Diabetes may cause you to have a poor blood supply (circulation) to your legs and feet. Because of this, the skin may be thinner, break easier, and heal more slowly. You also may have nerve damage in your legs and feet causing decreased feeling. You may not notice minor injuries to your feet that could lead to serious problems or infections. Taking care of your feet is one of the most important things you can do for yourself.  HOME CARE INSTRUCTIONS  Do not go barefoot. Bare feet are easily injured.   Check your feet daily for blisters, cuts, and redness.   Wash your feet with warm water (not hot) and mild soap. Pat your feet and between your toes until completely dry.   Apply a moisturizing lotion that does not contain alcohol or petroleum jelly to the dry skin on your feet and to dry brittle toenails. Do not put it between your toes.   Trim your toenails straight across. Do not dig under them or around the cuticle.   Do not cut corns or calluses, or try to remove them with medicine.   Wear clean cotton socks or stockings every day. Make sure they are not too tight. Do not wear knee high stockings since they may decrease blood flow to your legs.   Wear leather shoes that fit properly and have enough cushioning. To break in new shoes, wear them just a few hours a day to avoid injuring your feet.   Wear shoes at all times, even in the house.   Do not cross your legs. This may decrease the blood flow to your feet.   If you find a minor scrape, cut, or break in the skin on your feet, keep it and the skin around it clean and dry. These areas may be cleansed with mild soap and water. Do not  use peroxide, alcohol, iodine or Merthiolate.   When you remove an adhesive bandage, be sure not to harm the skin around it.   If you have a wound, look at it several times a day to make sure it is healing.   Do not use heating pads or hot water bottles. Burns can occur. If you have lost feeling in your feet or legs, you may not know it is happening until it is too late.   Report any cuts, sores or bruises to your caregiver. Do not wait!  SEEK MEDICAL CARE IF:   You have an injury that is not healing or you notice redness, numbness, burning, or tingling.   Your feet always feel cold.   You have pain or cramps in your legs and feet.  SEEK IMMEDIATE MEDICAL CARE IF:   There is increasing redness, swelling, or increasing pain in the wound.   There is a red line that goes up your leg.   Pus is coming from a wound.   You develop an unexplained oral temperature above 102 F (38.9 C), or as your caregiver suggests.   You notice a bad smell coming from an ulcer or wound.  MAKE SURE YOU:   Understand these instructions.   Will watch your condition.   Will  get help right away if you are not doing well or get worse.  Document Released: 10/31/2000 Document Revised: 07/16/2011 Document Reviewed: 05/09/2009 [facility] Patient Information 2012 [place].

## 2011-09-17 NOTE — Assessment & Plan Note (Addendum)
A1c greatly deteriorated due to prolonged time off any insulin. Likely has some malaise and dehydration secondary to polyuria and hyperglycemia, but patient now has restarted insulin and plans to continue with recent CBGs in acceptable range. Encouraged to continue insulin at previous dosing and follow up in one month to discuss further titration with Dr. Valentina Lucks.  Worsened neuropathic symptoms due to uncontrolled hyperglycemia. Will check labs today. Continue ARB, podiatry follow up.

## 2011-09-17 NOTE — Assessment & Plan Note (Signed)
Will check TSH given recent weight loss and hypertension. Continue current dose thyroxine for now.

## 2011-09-17 NOTE — Assessment & Plan Note (Signed)
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 She is now trying to focus on caring for herself. On celexa, but may need to titrate higher dose if mood seems to be more of an issue vs adjustment to her psychosocial stressors. Close follow up in one month.

## 2011-09-17 NOTE — Progress Notes (Signed)
  Subjective:    [name], female    DOB: 1941-04-07, 70 y.o.   MRN: [id number]  HPI 1. DM. Patient admits to feeling very stressed past several weeks/months. 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 She spent several weeks only taking lantus ~twice weekly instead of BID; however she has restarted the lantus last week at 60 U BID and Novolog 30-40 u with meals after her sister urged her to take better care of herself. Since restarting insulin, her highest CBG last week was 210 and lowest was 65, did not feel symptoms of fatigue, dizzyness, jittery. Has lost weight, unsure how much but pants are too loose. Endorses polyuria, fatigue.   2. HTN. Is taking BP meds as instructed. Had norvasc discontinued several months ago due to concurrent statin and interaction. Does not check at home. Denies edema, visual changes.  3. Toe wound. Had bumped her right great toe recently and caused darkened skin, pain. Went to [name] (podiatry) who did a procedure (sounds like incision to release some underlying blood). She is following up with him and seems to be healing now. Has poor sensation in feet and worsened numbness/tingling.   Review of Systems Endorses sore muscles in legs.     Objective:   Physical Exam  Vitals reviewed. Constitutional: She is oriented to person, place, and time. She appears well-developed and well-nourished. No distress.       Appears mildy disheveled (new today) and tearful when speaking about family members.   HENT:  Head: Normocephalic and atraumatic.  Eyes: EOM are normal. Pupils are equal, round, and reactive to light.  Cardiovascular: Normal rate, regular rhythm, normal heart sounds and intact distal pulses.   No murmur heard. Pulmonary/Chest: Effort normal and breath sounds normal. No respiratory distress. She has no wheezes. She has no rales.    Musculoskeletal: She exhibits no edema and no tenderness.       Has some thickened toenails and callous on bilateral feet. Great toe wound has some mild discoloration, but no ulcers, erythema or sign infection. Hypertrophic skin is nontender.   Neurological: She is alert and oriented to person, place, and time. No cranial nerve deficit. Coordination normal.          Assessment & Plan:

## 2011-09-17 NOTE — Assessment & Plan Note (Signed)
Previously controlled on current medications but now greatly above goal. Will continue hyzaar and increase clonidine to 0.2mg  BID for now. Would need to adjust statin if CCB were started.  Recheck in next 2 weeks in office. Check labs today to assess renal function.

## 2011-10-08 ENCOUNTER — Ambulatory Visit (INDEPENDENT_AMBULATORY_CARE_PROVIDER_SITE_OTHER): Payer: Medicare Other | Admitting: *Deleted

## 2011-10-08 VITALS — BP 126/72 | HR 72

## 2011-10-08 DIAGNOSIS — I10 Essential (primary) hypertension: Secondary | ICD-10-CM

## 2011-10-08 NOTE — Progress Notes (Signed)
In for BP check today . Bp checked manually with large adult cuff. BP   LA 126/72 pulse 72. She is taking medication as directed. Has follow up visit with PCP 12/10

## 2011-10-20 ENCOUNTER — Ambulatory Visit: Payer: Medicare Other | Admitting: Family Medicine

## 2011-10-27 ENCOUNTER — Ambulatory Visit (INDEPENDENT_AMBULATORY_CARE_PROVIDER_SITE_OTHER): Payer: Medicare Other | Admitting: Family Medicine

## 2011-10-27 ENCOUNTER — Encounter: Payer: Self-pay | Admitting: Family Medicine

## 2011-10-27 VITALS — BP 142/88 | Temp 98.0°F | Ht 62.0 in | Wt 196.9 lb

## 2011-10-27 DIAGNOSIS — E1165 Type 2 diabetes mellitus with hyperglycemia: Secondary | ICD-10-CM

## 2011-10-27 DIAGNOSIS — J189 Pneumonia, unspecified organism: Secondary | ICD-10-CM | POA: Insufficient documentation

## 2011-10-27 DIAGNOSIS — E118 Type 2 diabetes mellitus with unspecified complications: Secondary | ICD-10-CM

## 2011-10-27 MED ORDER — AZITHROMYCIN 500 MG PO TABS: 500.0000 mg | ORAL_TABLET | Freq: Every day | ORAL | Status: AC

## 2011-10-27 NOTE — Assessment & Plan Note (Signed)
Patient with concurrent influenza and reported interstitial infiltrate diagnosed at urgent care by chest xray. Finished tamiflu treatment.  Uncertain if this is related to viral pneumonia vs CA bacterial pneumonia. With presence of infiltrate, will treat with azithromycin. No hypoxia or dyspnea or fever. Follow up prn or if not improved in next week, may need to repeat chest xray.

## 2011-10-27 NOTE — Progress Notes (Signed)
  Subjective:    [name], female    DOB: 1941-06-19, 70 y.o.   MRN: [id number]  HPI  1. Influenza. Evaluated at UC 5 days ago and diagnosed with influenza. She did have vaccination this year. Has completed tamiflu course, still feeling "puny."  She did have some nausea, vomiting, diarrhea that are now resolved, just having residual productive cough. Taking phenergan prn. Denies SOB, wheezing, edema.   Had phone call from PA at [facility] this morning regarding a chest xray done at her visit last Wednesday. Was read originally as negative, however radiology now reports presence of interstitial infiltrate concerning for pneumonia and patient was not treated for this.   2. DM. Patient is now taking her insulin daily, but was not sure how to handle her vomiting and insulin dosing. She admits skipping some doses of novolog. CBGs had been 87-170 range. Yesterday fasting was 87, today fasting was 200. No longer vomiting and is tolerating fluids.   Review of Systems See HPI otherwise negative. Patient has been taking care of family members who are sick also.     Objective:   Physical Exam  Vitals reviewed. Constitutional: She is oriented to person, place, and time. She appears well-developed.       Appears fatigued  HENT:  Head: Normocephalic and atraumatic.  Eyes: EOM are normal. Pupils are equal, round, and reactive to light.  Cardiovascular: Normal rate, regular rhythm, normal heart sounds and intact distal pulses.   No murmur heard. Pulmonary/Chest: Effort normal and breath sounds normal. No respiratory distress. She has no wheezes. She has no rales.  Abdominal: Soft. Bowel sounds are normal. She exhibits no distension. There is no tenderness.  Musculoskeletal: She exhibits no edema.  Neurological: She is alert and oriented to person, place, and time. She exhibits normal muscle tone. Coordination normal.  Psychiatric: She has a normal mood and affect.            Assessment & Plan:

## 2011-10-27 NOTE — Patient Instructions (Addendum)
WIll treat you for pneumonia on top of your flu. Take antibiotic for next 4 days. Continue to drink lots of fluids and rest. If you start having fevers, shortness of breath or nausea/vomiting again then come to doctor. Continue taking lantus daily. If you can't eat meals, dont take novolog.  Pneumonia, Adult Pneumonia is an infection of the lungs.  CAUSES Pneumonia may be caused by bacteria or a virus. Usually, these infections are caused by breathing infectious particles into the lungs (respiratory tract). SYMPTOMS   Cough.   Fever.   Chest pain.   Increased rate of breathing.   Wheezing.   Mucus production.  DIAGNOSIS  If you have the common symptoms of pneumonia, your caregiver will typically confirm the diagnosis with a chest X-ray. The X-ray will show an abnormality in the lung (pulmonary infiltrate) if you have pneumonia. Other tests of your blood, urine, or sputum may be done to find the specific cause of your pneumonia. Your caregiver may also do tests (blood gases or pulse oximetry) to see how well your lungs are working. TREATMENT  Some forms of pneumonia may be spread to other people when you cough or sneeze. You may be asked to wear a mask before and during your exam. Pneumonia that is caused by bacteria is treated with antibiotic medicine. Pneumonia that is caused by the influenza virus may be treated with an antiviral medicine. Most other viral infections must run their course. These infections will not respond to antibiotics.  PREVENTION A pneumococcal shot (vaccine) is available to prevent a common bacterial cause of pneumonia. This is usually suggested for:  People over 68 years old.   Patients on chemotherapy.   People with chronic lung problems, such as bronchitis or emphysema.   People with immune system problems.  If you are over 65 or have a high risk condition, you may receive the pneumococcal vaccine if you have not received it before. In some countries, a  routine influenza vaccine is also recommended. This vaccine can help prevent some cases of pneumonia.You may be offered the influenza vaccine as part of your care. If you smoke, it is time to quit. You may receive instructions on how to stop smoking. Your caregiver can provide medicines and counseling to help you quit. HOME CARE INSTRUCTIONS   Cough suppressants may be used if you are losing too much rest. However, coughing protects you by clearing your lungs. You should avoid using cough suppressants if you can.   Your caregiver may have prescribed medicine if he or she thinks your pneumonia is caused by a bacteria or influenza. Finish your medicine even if you start to feel better.   Your caregiver may also prescribe an expectorant. This loosens the mucus to be coughed up.   Only take over-the-counter or prescription medicines for pain, discomfort, or fever as directed by your caregiver.   Do not smoke. Smoking is a common cause of bronchitis and can contribute to pneumonia. If you are a smoker and continue to smoke, your cough may last several weeks after your pneumonia has cleared.   A cold steam vaporizer or humidifier in your room or home may help loosen mucus.   Coughing is often worse at night. Sleeping in a semi-upright position in a recliner or using a couple pillows under your head will help with this.   Get rest as you feel it is needed. Your body will usually let you know when you need to rest.  [place]  CARE IF:   Your illness becomes worse. This is especially true if you are elderly or weakened from any other disease.   You cannot control your cough with suppressants and are losing sleep.   You begin coughing up blood.   You develop pain which is getting worse or is uncontrolled with medicines.   You have a fever.   Any of the symptoms which initially brought you in for treatment are getting worse rather than better.   You develop shortness of breath or  chest pain.  MAKE SURE YOU:   Understand these instructions.   Will watch your condition.   Will get help right away if you are not doing well or get worse.  Document Released: 11/03/2005 Document Revised: 07/16/2011 Document Reviewed: 01/23/2011 [facility] Patient Information 2012 [place].

## 2011-10-27 NOTE — Assessment & Plan Note (Signed)
CBGs have improved overall, but some increase in fasting CBG today. Have counseled to continue basal insulin even while ill, and to hold only her novolog during times of poor appetite or emesis. Will follow up A1c in 2 months.

## 2011-12-06 ENCOUNTER — Other Ambulatory Visit: Payer: Self-pay | Admitting: Family Medicine

## 2011-12-07 NOTE — Telephone Encounter (Signed)
Refill request

## 2011-12-24 ENCOUNTER — Other Ambulatory Visit: Payer: Self-pay | Admitting: Family Medicine

## 2011-12-24 NOTE — Telephone Encounter (Signed)
Refill request

## 2012-03-08 ENCOUNTER — Other Ambulatory Visit: Payer: Self-pay | Admitting: Family Medicine

## 2012-03-08 DIAGNOSIS — Z803 Family history of malignant neoplasm of breast: Secondary | ICD-10-CM

## 2012-03-08 DIAGNOSIS — Z1231 Encounter for screening mammogram for malignant neoplasm of breast: Secondary | ICD-10-CM

## 2012-03-15 ENCOUNTER — Ambulatory Visit (INDEPENDENT_AMBULATORY_CARE_PROVIDER_SITE_OTHER): Payer: Medicare Other | Admitting: Family Medicine

## 2012-03-15 ENCOUNTER — Encounter: Payer: Self-pay | Admitting: Family Medicine

## 2012-03-15 VITALS — BP 109/69 | HR 75 | Temp 97.4°F | Ht 62.0 in | Wt 185.0 lb

## 2012-03-15 DIAGNOSIS — F3289 Other specified depressive episodes: Secondary | ICD-10-CM

## 2012-03-15 DIAGNOSIS — F329 Major depressive disorder, single episode, unspecified: Secondary | ICD-10-CM

## 2012-03-15 DIAGNOSIS — E1142 Type 2 diabetes mellitus with diabetic polyneuropathy: Secondary | ICD-10-CM

## 2012-03-15 DIAGNOSIS — E1149 Type 2 diabetes mellitus with other diabetic neurological complication: Secondary | ICD-10-CM

## 2012-03-15 DIAGNOSIS — E1140 Type 2 diabetes mellitus with diabetic neuropathy, unspecified: Secondary | ICD-10-CM

## 2012-03-15 DIAGNOSIS — E118 Type 2 diabetes mellitus with unspecified complications: Secondary | ICD-10-CM

## 2012-03-15 DIAGNOSIS — I10 Essential (primary) hypertension: Secondary | ICD-10-CM

## 2012-03-15 DIAGNOSIS — E1165 Type 2 diabetes mellitus with hyperglycemia: Secondary | ICD-10-CM

## 2012-03-15 DIAGNOSIS — IMO0002 Reserved for concepts with insufficient information to code with codable children: Secondary | ICD-10-CM

## 2012-03-15 DIAGNOSIS — L84 Corns and callosities: Secondary | ICD-10-CM

## 2012-03-15 LAB — POCT GLYCOSYLATED HEMOGLOBIN (HGB A1C): Hemoglobin A1C: >14

## 2012-03-15 MED ORDER — EXENATIDE 10 MCG/0.04ML ~~LOC~~ SOPN: 10.0000 ug | PEN_INJECTOR | Freq: Two times a day (BID) | SUBCUTANEOUS | Status: DC

## 2012-03-15 NOTE — Patient Instructions (Signed)
Looks like foot is healing. Make sure to wear diabetic shoes. If pain does not improve, make sure to have podiatrist re-exam. Make an appointment with Dr. Verlee Rossetti in 2-3 weeks. Important to take care of yourself with insulin daily. I will call you if labs ok, i will not call.  Diabetes and Foot Care Diabetes may cause you to have a poor blood supply (circulation) to your legs and feet. Because of this, the skin may be thinner, break easier, and heal more slowly. You also may have nerve damage in your legs and feet causing decreased feeling. You may not notice minor injuries to your feet that could lead to serious problems or infections. Taking care of your feet is one of the most important things you can do for yourself.  HOME CARE INSTRUCTIONS  Do not go barefoot. Bare feet are easily injured.   Check your feet daily for blisters, cuts, and redness.   Wash your feet with warm water (not hot) and mild soap. Pat your feet and between your toes until completely dry.   Apply a moisturizing lotion that does not contain alcohol or petroleum jelly to the dry skin on your feet and to dry brittle toenails. Do not put it between your toes.   Trim your toenails straight across. Do not dig under them or around the cuticle.   Do not cut corns or calluses, or try to remove them with medicine.   Wear clean cotton socks or stockings every day. Make sure they are not too tight. Do not wear knee high stockings since they may decrease blood flow to your legs.   Wear leather shoes that fit properly and have enough cushioning. To break in new shoes, wear them just a few hours a day to avoid injuring your feet.   Wear shoes at all times, even in the house.   Do not cross your legs. This may decrease the blood flow to your feet.   If you find a minor scrape, cut, or break in the skin on your feet, keep it and the skin around it clean and dry. These areas may be cleansed with mild soap and water. Do not use  peroxide, alcohol, iodine or Merthiolate.   When you remove an adhesive bandage, be sure not to harm the skin around it.   If you have a wound, look at it several times a day to make sure it is healing.   Do not use heating pads or hot water bottles. Burns can occur. If you have lost feeling in your feet or legs, you may not know it is happening until it is too late.   Report any cuts, sores or bruises to your caregiver. Do not wait!  SEEK MEDICAL CARE IF:   You have an injury that is not healing or you notice redness, numbness, burning, or tingling.   Your feet always feel cold.   You have pain or cramps in your legs and feet.  SEEK IMMEDIATE MEDICAL CARE IF:   There is increasing redness, swelling, or increasing pain in the wound.   There is a red line that goes up your leg.   Pus is coming from a wound.   You develop an unexplained oral temperature above 102 F (38.9 C), or as your caregiver suggests.   You notice a bad smell coming from an ulcer or wound.  MAKE SURE YOU:   Understand these instructions.   Will watch your condition.   Will get help  right away if you are not doing well or get worse.  Document Released: 10/31/2000 Document Revised: 10/23/2011 Document Reviewed: 05/09/2009 [facility] Patient Information 2012 [place].

## 2012-03-16 LAB — CBC
MCH: 32.4 pg (ref 26.0–34.0)
MCHC: 34.1 g/dL (ref 30.0–36.0)
MCV: 95 fL (ref 78.0–100.0)
Platelets: 176 10*3/uL (ref 150–400)
RBC: 4.44 MIL/uL (ref 3.87–5.11)
RDW: 13.3 % (ref 11.5–15.5)

## 2012-03-16 LAB — COMPREHENSIVE METABOLIC PANEL
ALT: 12 U/L (ref 0–35)
AST: 15 U/L (ref 0–37)
Albumin: 3.9 g/dL (ref 3.5–5.2)
Alkaline Phosphatase: 73 U/L (ref 39–117)
Glucose, Bld: 164 mg/dL — ABNORMAL HIGH (ref 70–99)
Potassium: 3.2 mEq/L — ABNORMAL LOW (ref 3.5–5.3)
Sodium: 140 mEq/L (ref 135–145)
Total Bilirubin: 0.6 mg/dL (ref 0.3–1.2)
Total Protein: 6.2 g/dL (ref 6.0–8.3)

## 2012-03-17 ENCOUNTER — Telehealth: Payer: Self-pay | Admitting: Family Medicine

## 2012-03-17 DIAGNOSIS — L84 Corns and callosities: Secondary | ICD-10-CM | POA: Insufficient documentation

## 2012-03-17 NOTE — Assessment & Plan Note (Signed)
Significant involvement. Discussed importance of daily foot checks, wearing diabetic shoes which she recently obtained, and followup regularly with her podiatrist.

## 2012-03-17 NOTE — Assessment & Plan Note (Signed)
At goal today. If patient unwilling to take daily medication, will discontinue clonidine in the future. Discussed importance of daily medications. We'll check renal function today. Follow up one month.

## 2012-03-17 NOTE — Assessment & Plan Note (Signed)
Very poor control do to intermittent compliance with insulin treatment. Barrier is patient's motivation to care for herself in the setting of caretaker fatigue. She is currently compliant with her insulin treatment. Labs checked today to assess her hydration status after a period of uncontrolled hyperglycemia. Patient will follow up within the month given poor control risk of worsening.

## 2012-03-17 NOTE — Telephone Encounter (Signed)
Called about labs. Potassium was low. Suspect this is due to her recent osmotic dehydration and restarting insulin. Advised to eat some extra high potassium foods bananas, tomatoes. We will recheck her K at next visit.

## 2012-03-17 NOTE — Progress Notes (Signed)
  Subjective:    [name], female    DOB: [birth year], 71 y.o.   MRN: [id number]  HPI  1. DM. Patient admits her diabetic control has been poor. This is again due to her skipping insulin as a response to her stress as being primary caretaker for both her ill mother and son. She decided one week ago to restart her insulin daily with Lantus 60 units twice a day and NovoLog with meals. She has to run out of [place] and is not currently taking this. During her time of noncompliance blood sugars were over 300, however she has regained control with last evening CBG 80 prior to dinner and 160 postprandial. Has felt tired, with polyuria, polydipsia.  2. Right Foot wound. Approximately 2 weeks ago patient noted pain on her right lateral this year and found a blister with a piece of skin. Her husband clipped the skin with toenail clippers. Believe the area became infected with worsening pain and redness. She presented to her podiatrist last week who debrided the blister and gave her a course of azithromycin which she has completed. The swelling, pain, redness have improved with just mild residual redness to the area. He does have poor sensation to 2 neuropathy.  3. HTN. Does not check frequently. At home values systolic between [id number]. Admits she does not always take her clonidine and Hyzaar. Denies edema, chest pain, dyspnea, headache, visual change.  4. depression. Patient taking Celexa low-dose. She seems quite stressed due to her role as caretaker. She has recently decided to begin taking care of herself again and with addition of a home health nurse for her mother seemed this is a positive change.  Review of Systems See history of present illness otherwise negative.    Objective:   Physical Exam  Vitals reviewed. Constitutional: She is oriented to person, place, and time. She appears well-developed and well-nourished. No distress.  HENT:  Head: Normocephalic and atraumatic.    Mouth/Throat: Oropharynx is clear and moist.  Eyes: EOM are normal. Pupils are equal, round, and reactive to light.  Cardiovascular: Normal rate, regular rhythm, normal heart sounds and intact distal pulses.   No murmur heard. Pulmonary/Chest: Effort normal and breath sounds normal. No respiratory distress. She has no wheezes.  Abdominal: Soft.  Musculoskeletal: She exhibits no edema and no tenderness.       Right lateral sole has evidence of healing blister beneath a callus at the fifth metatarsal head. No underlying abscess palpated. No erythema, edema noted. Full range of motion in toes. Sensation to light touch absent in bilateral for forefoot and toes, intact at heels.  Neurological: She is alert and oriented to person, place, and time. Coordination normal.  Skin: No rash noted.  Psychiatric:       Tearful at times.        Assessment & Plan:

## 2012-03-17 NOTE — Assessment & Plan Note (Signed)
Appears to be healing, will not extend antiobiotic course. Advised to check daily and follow up if any sign of worsening. Encourage podiatry follow up as well, and importance of glycemic control with healing.

## 2012-03-17 NOTE — Assessment & Plan Note (Signed)
Would like to reassess patient's coping with stress at next visit. Consider titration of Celexa if she is willing.

## 2012-04-08 ENCOUNTER — Encounter: Payer: Self-pay | Admitting: Family Medicine

## 2012-04-08 ENCOUNTER — Ambulatory Visit (INDEPENDENT_AMBULATORY_CARE_PROVIDER_SITE_OTHER): Payer: Medicare Other | Admitting: Family Medicine

## 2012-04-08 ENCOUNTER — Ambulatory Visit (HOSPITAL_COMMUNITY)
Admission: RE | Admit: 2012-04-08 | Discharge: 2012-04-08 | Disposition: A | Discharge status: Home / Self Care | Payer: Medicare Other | Source: Ambulatory Visit | Attending: Family Medicine | Admitting: Family Medicine

## 2012-04-08 VITALS — BP 147/81 | HR 79 | Temp 97.4°F | Ht 62.0 in | Wt 191.6 lb

## 2012-04-08 DIAGNOSIS — M7989 Other specified soft tissue disorders: Secondary | ICD-10-CM | POA: Insufficient documentation

## 2012-04-08 DIAGNOSIS — I10 Essential (primary) hypertension: Secondary | ICD-10-CM

## 2012-04-08 DIAGNOSIS — R609 Edema, unspecified: Secondary | ICD-10-CM

## 2012-04-08 DIAGNOSIS — R600 Localized edema: Secondary | ICD-10-CM

## 2012-04-08 DIAGNOSIS — M7120 Synovial cyst of popliteal space [Baker], unspecified knee: Secondary | ICD-10-CM

## 2012-04-08 DIAGNOSIS — IMO0002 Reserved for concepts with insufficient information to code with codable children: Secondary | ICD-10-CM

## 2012-04-08 DIAGNOSIS — L84 Corns and callosities: Secondary | ICD-10-CM

## 2012-04-08 DIAGNOSIS — E1165 Type 2 diabetes mellitus with hyperglycemia: Secondary | ICD-10-CM

## 2012-04-08 NOTE — Patient Instructions (Signed)
Your callus and foot injury is healing! Keep checking feet daily. Your ankle swelling may be just vein insufficiency, but need to make sure isn't blood clot. Have your ultrasound done. Make an appointment in one month. Will call if lab tests need discussion. Great job with your diabetes and taking insulin! You are making positive changes. Keep it up.

## 2012-04-08 NOTE — Progress Notes (Signed)
VASCULAR LAB PRELIMINARY  PRELIMINARY  PRELIMINARY  PRELIMINARY  Left lower extremity duplex completed.    Preliminary report: Left leg is negative for deep and superficial vein thrombosis.  Baker's cyst is noted in popliteal fossa. Sara Graves, 04/08/2012, 6:21 PM

## 2012-04-09 ENCOUNTER — Encounter: Payer: Self-pay | Admitting: Family Medicine

## 2012-04-09 DIAGNOSIS — M7120 Synovial cyst of popliteal space [Baker], unspecified knee: Secondary | ICD-10-CM | POA: Insufficient documentation

## 2012-04-09 LAB — BASIC METABOLIC PANEL
CO2: 20 mEq/L (ref 19–32)
Calcium: 9.5 mg/dL (ref 8.4–10.5)
Creat: 0.62 mg/dL (ref 0.50–1.10)
Sodium: 141 mEq/L (ref 135–145)

## 2012-04-09 LAB — LDL CHOLESTEROL, DIRECT: Direct LDL: 90 mg/dL

## 2012-04-09 NOTE — Assessment & Plan Note (Addendum)
Patient endorses improved glycemic control since restarting insulin. No changes today. Followup visit in one month given patient's propensity for medication noncompliance when she feels out of control. Will followup at that visit and provide encouragement. Followup A1c in 2 months. Patient to see ophthalmologist after her planned back surgery in June.

## 2012-04-09 NOTE — Progress Notes (Signed)
  Subjective:    [name], female    DOB: Aug 20, 1941, 71 y.o.   MRN: [id number]  HPI  1. Diabetes Mellitus. Patient states control is improved since restarting her insulin regimen. Highest CBG recently is 190. She denies continued polyuria, polydipsia. Denies hypoglycemia.  2. Foot callus/wound. Callus and wound is healing. No drainage, pain, redness, swelling from the area. She has followed up with podiatry as recommended and continues daily foot checks. Has obtained close toed shoes also.  3. Ankle swelling. Patient has noticed left ankle/calf swelling for past 2 weeks. This is a new problem. Accompanied by mild tenderness in the calf region above ankle. She denies any trauma, falls, fever, dyspnea, redness.  4. Back pain-chronic. Has been seeing spine specialist, planning to have replacement of battery in nerve stimulator (placed in 2009).  Review of Systems See HPI otherwise negative.  reports that she has never smoked. She does not have any smokeless tobacco history on file.    Objective:   Physical Exam  Vitals reviewed. Constitutional: She is oriented to person, place, and time. She appears well-developed and well-nourished. No distress.  HENT:  Head: Normocephalic and atraumatic.  Mouth/Throat: Oropharynx is clear and moist.  Eyes: Pupils are equal, round, and reactive to light.  Cardiovascular: Normal rate, regular rhythm and normal heart sounds.   Pulmonary/Chest: Effort normal and breath sounds normal. No respiratory distress. She has no wheezes. She has no rales.  Musculoskeletal: She exhibits edema and tenderness.       Asymmetric left lower leg swelling in calf, just above ankle joint. 1+ pitting, slightly tender. Faint redness. No joint effusions, warmth. Good pulses palpated bilateral DP.  Neurological: She is alert and oriented to person, place, and time. Coordination normal.  Skin: No rash noted.  Psychiatric: She has a normal mood and affect.      Assessment & Plan:

## 2012-04-09 NOTE — Assessment & Plan Note (Signed)
Improved, continue foot care and podiatry checks.

## 2012-04-09 NOTE — Assessment & Plan Note (Signed)
BP Readings from Last 3 Encounters:  04/08/12 147/81  03/15/12 109/69  10/27/11 142/88   Blood pressure is widely variable recently. Near goal, will not make changes today to avoid side effects. Will continue current losartan, HCTZ, clonidine. Recheck labs in 3-4 months.

## 2012-04-09 NOTE — Assessment & Plan Note (Signed)
Asymmetric swelling and tenderness in left leg, exam concerning for DVT. Patient sent for lower extremity duplex and study was negative but did find presents of Baker cyst. Advised patient to continue with leg elevation and followup as needed.

## 2012-05-11 ENCOUNTER — Encounter: Payer: Self-pay | Admitting: Family Medicine

## 2012-05-11 ENCOUNTER — Ambulatory Visit (INDEPENDENT_AMBULATORY_CARE_PROVIDER_SITE_OTHER): Payer: Medicare Other | Admitting: Family Medicine

## 2012-05-11 VITALS — BP 159/79 | HR 71 | Temp 97.9°F | Ht 62.0 in | Wt 202.0 lb

## 2012-05-11 DIAGNOSIS — E1165 Type 2 diabetes mellitus with hyperglycemia: Secondary | ICD-10-CM

## 2012-05-11 DIAGNOSIS — D229 Melanocytic nevi, unspecified: Secondary | ICD-10-CM

## 2012-05-11 DIAGNOSIS — I10 Essential (primary) hypertension: Secondary | ICD-10-CM

## 2012-05-11 DIAGNOSIS — D239 Other benign neoplasm of skin, unspecified: Secondary | ICD-10-CM

## 2012-05-11 DIAGNOSIS — R110 Nausea: Secondary | ICD-10-CM

## 2012-05-11 NOTE — Patient Instructions (Addendum)
Make sure to eat and drink good, stay hydrated. Your blood sugars seems stable, keep up the good work. You can try stopping byetta to see if nausea improves. If you continue having nausea or vomiting then come back. Check your blood pressure at least twice a week and write this down, bring in next time. You are due for an eye check. You have a baker's cyst on left causing swelling, keep the leg elevated. Make an appointment for mole removal at your convenience.

## 2012-05-12 NOTE — Progress Notes (Signed)
  Subjective:    [name], female    DOB: Mar 27, 1941, 71 y.o.   MRN: [id number]  HPI  1. DM. Control is improved according to patient she is compliant with her novolog, lantus, byetta. This am fasting was 109 and last evening 200. Denies any low blood sugars. She has noted some recent nausea after taking her morning insulin and byetta and eating breakfast. Denies polyuria, polydipsia, emesis, abdominal pain.  2. HTN. Had episode of low BP, states it was 70/50 while she was getting her back stimulator battery changed. She felt some weakness. This resolved afterwards and she normally has values in [id number] systolic range which she checks regularly at home. Did not bring log. Is compliant with medications, though has not taken her am dose clonidine today. Denies syncope, visual changes, edema.   3. Forehead lesion. Has a mole on forehead, thinks is getting bigger past few months. Is irritated and mildly painful at times. No bleeding, trauma.  Review of Systems See HPI otherwise negative.  reports that she has never smoked. She does not have any smokeless tobacco history on file.    Objective:   Physical Exam  Vitals reviewed. Constitutional: She is oriented to person, place, and time. She appears well-developed and well-nourished.  HENT:  Head: Atraumatic.       Mole on left forehead, well circumscribed, raised, appears irritates with slight erythematous margin, rough appearance. No bleeding, ulceration.   Eyes: EOM are normal. Pupils are equal, round, and reactive to light.  Cardiovascular: Normal rate, regular rhythm and normal heart sounds.   No murmur heard. Pulmonary/Chest: Effort normal. No respiratory distress. She has no wheezes. She has no rales.  Abdominal: Soft. There is no tenderness.  Musculoskeletal: She exhibits no edema and no tenderness.  Neurological: She is alert and oriented to person, place, and time. She exhibits normal muscle tone. Coordination normal.   Psychiatric: She has a normal mood and affect.       Assessment & Plan:

## 2012-05-13 DIAGNOSIS — R110 Nausea: Secondary | ICD-10-CM | POA: Insufficient documentation

## 2012-05-13 DIAGNOSIS — D229 Melanocytic nevi, unspecified: Secondary | ICD-10-CM | POA: Insufficient documentation

## 2012-05-13 NOTE — Assessment & Plan Note (Signed)
Return for shave biopsy as this is irritated and becoming larger. Do not suspect malignancy.

## 2012-05-13 NOTE — Assessment & Plan Note (Signed)
Uncertain etiology. Onset after her am dose insulin/byetta and breakfast. Will hold byetta and reassess. RTC for red flags emesis, abdominal pain.

## 2012-05-13 NOTE — Assessment & Plan Note (Signed)
Labile recently. No med changes today.bAsked patient to bring daily log of measurements at next visit in 1-2 months.

## 2012-05-13 NOTE — Assessment & Plan Note (Signed)
Glycemic control is good with stated CBGs recently. With the nausea onset, have asked patient to hold her byetta and monitor symptoms, as this may be related to post-prandial am symptoms. Patient to return to her ophthalmologist after her back stimulator is fixed. Follow up A1c in 1 month.

## 2012-05-17 ENCOUNTER — Ambulatory Visit: Payer: Medicare Other

## 2012-05-19 ENCOUNTER — Ambulatory Visit (INDEPENDENT_AMBULATORY_CARE_PROVIDER_SITE_OTHER): Payer: Medicare Other | Admitting: Family Medicine

## 2012-05-19 ENCOUNTER — Encounter: Payer: Self-pay | Admitting: Family Medicine

## 2012-05-19 VITALS — BP 159/83 | HR 78 | Temp 96.2°F | Ht 62.0 in | Wt 198.5 lb

## 2012-05-19 DIAGNOSIS — D2330 Other benign neoplasm of skin of unspecified part of face: Secondary | ICD-10-CM

## 2012-05-19 DIAGNOSIS — D2239 Melanocytic nevi of other parts of face: Secondary | ICD-10-CM

## 2012-05-19 NOTE — Progress Notes (Signed)
  Subjective:    [name], female    DOB: 12-25-1940, 71 y.o.   MRN: [id number]  HPI  1. Mole. Continues to be irritated, painful sometimes. Consents for removal.  2. Left leg pain. Sent for LE duplex and diagnosis is bakers cyst. She has been elevating the leg, still having pain in the knee itself, worse with activities. Feels some swelling at times and pain in posterior knee. She denies any fever, chills, redness, dyspnea, chest pain, nausea.   Review of Systems See HPI otherwise negative.  reports that she has never smoked. She does not have any smokeless tobacco history on file.     Objective:   Physical Exam  Vitals reviewed. Constitutional: She is oriented to person, place, and time. She appears well-developed and well-nourished. No distress.  HENT:  Head: Atraumatic.  Mouth/Throat: Oropharynx is clear and moist.  Eyes: EOM are normal. Pupils are equal, round, and reactive to light.  Pulmonary/Chest: Effort normal.  Musculoskeletal: She exhibits edema and tenderness.       Left knee TTP in medial joint line and posterior knee. Evidence of effusion limited by obesity. No redness, skin changes. Mild trace/stable left lower leg edema.   Neurological: She is alert and oriented to person, place, and time. No cranial nerve deficit.  Skin: She is not diaphoretic.       Left forehead has circumscribed raised mole, unchanged from previous. Evidence of irritation.  Psychiatric: She has a normal mood and affect.          Assessment & Plan:   1. Baker's Cyst. Not improved with conservative management. Consent obtained and discussion of risks/benefits. Trial of intra-articular steroids. Discussed follow up at sports medicine for US-guided aspiration if fails to improve. F/u in one month. A steroid injection was performed at left knee, medial approach using 1% plain Lidocaine and 4 mg of Kenalog. This was well tolerated. Discussed hyperglycemia as a risk and advised to  increase her novolog and/or lantus by 5 units if needed. She agrees to call for follow up if unable to become controlled.   2. Mole removal. Appears irritated and growing. Shave biopsy of mole performed with 1% lido w/ epi. Consent obtained. Hemostasis with al Chloride and silver nitrate. No immediate complications. specimen to pathology.

## 2012-05-19 NOTE — Patient Instructions (Addendum)
Will send your mole to pathologist. You have a bakers cyst, caused by many different things including arthritis. You may take tylenol or motrin (in short term-not forever). Watch your blood sugar extra careful for next week. Please call if you are unable to get blood sugar under control by adding up to 5 extra units of insulin.  Knee Injection Joint injections are shots. Your caregiver will place a needle into your knee joint. The needle is used to put medicine into the joint. These shots can be used to help treat different painful knee conditions such as osteoarthritis, bursitis, local flare-ups of rheumatoid arthritis, and pseudogout. Anti-inflammatory medicines such as corticosteroids and anesthetics are the most common medicines used for joint and soft tissue injections.  PROCEDURE  The skin over the kneecap will be cleaned with an antiseptic solution.   Your caregiver will inject a small amount of a local anesthetic (a medicine like Novocaine) just under the skin in the area that was cleaned.   After the area becomes numb, a second injection is done. This second injection usually includes an anesthetic and an anti-inflammatory medicine called a steroid or cortisone. The needle is carefully placed in between the kneecap and the knee, and the medicine is injected into the joint space.   After the injection is done, the needle is removed. Your caregiver may place a bandage over the injection site. The whole procedure takes no more than a couple of minutes.  BEFORE THE PROCEDURE  Wash all of the skin around the entire knee area. Try to remove any loose, scaling skin. There is no other specific preparation necessary unless advised otherwise by your caregiver. LET YOUR CAREGIVER KNOW ABOUT:   Allergies.   Medications taken including herbs, eye drops, over the counter medications, and creams.   Use of steroids (by mouth or creams).   Possible pregnancy, if applicable.   Previous problems  with anesthetics or Novocaine.   History of blood clots (thrombophlebitis).   History of bleeding or blood problems.   Previous surgery.   Other health problems.  RISKS AND COMPLICATIONS Side effects from cortisone shots are rare. They include:   Slight bruising of the skin.   Shrinkage of the normal fatty tissue under the skin where the shot was given.   Increase in pain after the shot.   Infection.   Weakening of tendons or tendon rupture.   Allergic reaction to the medicine.   Diabetics may have a temporary increase in their blood sugar after a shot.   Cortisone can temporarily weaken the immune system. While receiving these shots, you should not get certain vaccines. Also, avoid contact with anyone who has chickenpox or measles. Especially if you have never had these diseases or have not been previously immunized. Your immune system may not be strong enough to fight off the infection while the cortisone is in your system.  AFTER THE PROCEDURE   You can go home after the procedure.   You may need to put ice on the joint 15 to 20 minutes every 3 or 4 hours until the pain goes away.   You may need to put an elastic bandage on the joint.  HOME CARE INSTRUCTIONS   Only take over-the-counter or prescription medicines for pain, discomfort, or fever as directed by your caregiver.   You should avoid stressing the joint. Unless advised otherwise, avoid activities that put a lot of pressure on a knee joint, such as:   Jogging.   Bicycling.  Recreational climbing.   Hiking.   Laying down and elevating the leg/knee above the level of your heart can help to minimize swelling.  SEEK MEDICAL CARE IF:   You have repeated or worsening swelling.   There is drainage from the puncture area.   You develop red streaking that extends above or below the site where the needle was inserted.  SEEK IMMEDIATE MEDICAL CARE IF:   You develop a fever.   You have pain that gets worse  even though you are taking pain medicine.   The area is red and warm, and you have trouble moving the joint.  MAKE SURE YOU:   Understand these instructions.   Will watch your condition.   Will get help right away if you are not doing well or get worse.  Document Released: 01/25/2007 Document Revised: 10/23/2011 Document Reviewed: 10/22/2007 [facility] Patient Information 2012 [place].

## 2012-05-24 ENCOUNTER — Ambulatory Visit: Payer: Medicare Other

## 2012-05-27 ENCOUNTER — Ambulatory Visit
Admission: RE | Admit: 2012-05-27 | Discharge: 2012-05-27 | Disposition: A | Discharge status: Home / Self Care | Payer: Medicare Other | Source: Ambulatory Visit | Attending: Family Medicine | Admitting: Family Medicine

## 2012-05-27 DIAGNOSIS — Z1231 Encounter for screening mammogram for malignant neoplasm of breast: Secondary | ICD-10-CM

## 2012-05-27 DIAGNOSIS — Z803 Family history of malignant neoplasm of breast: Secondary | ICD-10-CM

## 2012-06-16 ENCOUNTER — Telehealth: Payer: Self-pay | Admitting: Family Medicine

## 2012-06-16 NOTE — Telephone Encounter (Signed)
[facility] has asked the patient to call for an Rx for her Synthroid to be sent in for her to get a refill.

## 2012-06-17 MED ORDER — LEVOTHYROXINE SODIUM 50 MCG PO TABS: 50.0000 ug | ORAL_TABLET | Freq: Every day | ORAL | Status: DC

## 2012-06-17 NOTE — Telephone Encounter (Signed)
Refill done.Sara Graves [name]

## 2012-06-18 ENCOUNTER — Ambulatory Visit (INDEPENDENT_AMBULATORY_CARE_PROVIDER_SITE_OTHER): Payer: Medicare Other | Admitting: Family Medicine

## 2012-06-18 ENCOUNTER — Encounter: Payer: Self-pay | Admitting: Family Medicine

## 2012-06-18 VITALS — BP 129/72 | HR 80 | Temp 98.1°F | Ht 62.0 in | Wt 190.4 lb

## 2012-06-18 DIAGNOSIS — R300 Dysuria: Secondary | ICD-10-CM

## 2012-06-18 LAB — POCT WET PREP (WET MOUNT): Clue Cells Wet Prep Whiff POC: NEGATIVE

## 2012-06-18 LAB — POCT URINALYSIS DIPSTICK
Glucose, UA: >=1000
Nitrite, UA: NEGATIVE
Protein, UA: NEGATIVE
Spec Grav, UA: 1.015
Urobilinogen, UA: 0.2

## 2012-06-18 MED ORDER — FLUCONAZOLE 150 MG PO TABS: 150.0000 mg | ORAL_TABLET | Freq: Once | ORAL | Status: AC

## 2012-06-18 NOTE — Progress Notes (Signed)
Subjective:   1. Dysuria-patient with 5 days of dysuria associated with burning and itching in vaginal area. Feels like yeast infections she has had in the past. She has tried AZO, yeast infection cream, vagisil ointment, eating yougurt and cranberry juice without relief. She noticed a tinge of blood when wiping after peeing so decided to come in. No further blood since that time  Of note, no polyuria, fevers, nausea, vomiting. Patient has not been sexually active for over a year and defers STI testing including GC/Chlamydia.   DM poorly controlled patient states due to compliance but she has been better about taking insulin over last 2-3 weeks and has not seen any CBGs over 200.   ROS--See HPI  Past Medical History-poorly controlled diabetic with A1c >14 Reviewed problem list.  Medications- reviewed and updated Chief complaint-noted  Objective:  Gen: NAD CV: RRR no mrg Lungs: CTAB Abdomen: soft, nontender, nondistended Vagina: dry, no gross discharge, blind swab performed without speculum exam.  External GU exam: no distinct area of inflammation or irritation.  MSK: moves all extremities, no edema Back: no CVA tenderness   Assessment/Plan: See problem oriented charted

## 2012-06-18 NOTE — Patient Instructions (Addendum)
Dear Sara Graves,   It was great to see you today. Thank you for coming to clinic. Please read below regarding the issues that we discussed.   1. Since you have had yeast infections before and this seems very similar, I am going to go ahead and treat you for a yeast infection.  2. I will let you know if your test comes back positive for bacterial vaginosis or trichomonas.   Please follow up in clinic in 1-2 weeks to follow up on diabetes. Please keep taking your insulin as prescribed, I am glad you have been taking the shots more regularly.  Please call earlier if you have any questions or concerns.   Sincerely,  Dr. Garret Reddish

## 2012-06-19 NOTE — Assessment & Plan Note (Signed)
UA without appearance of UTI (neg leuk, nitrities), no signs of hematuria (no blood), does represent poorly controlled DM with >1000 glucose. Wet prep without trich or clue cells. Positive for few yeast. Will treat with diflucan x1. Given poorly controlled DM, would consider treating with longer course of at least 3 days if does not resolve.

## 2012-07-07 ENCOUNTER — Encounter: Payer: Self-pay | Admitting: Family Medicine

## 2012-07-07 ENCOUNTER — Ambulatory Visit (INDEPENDENT_AMBULATORY_CARE_PROVIDER_SITE_OTHER): Payer: Medicare Other | Admitting: Family Medicine

## 2012-07-07 VITALS — BP 132/78 | HR 85 | Ht 64.0 in | Wt 194.0 lb

## 2012-07-07 DIAGNOSIS — E1165 Type 2 diabetes mellitus with hyperglycemia: Secondary | ICD-10-CM

## 2012-07-07 DIAGNOSIS — M7120 Synovial cyst of popliteal space [Baker], unspecified knee: Secondary | ICD-10-CM

## 2012-07-07 DIAGNOSIS — F329 Major depressive disorder, single episode, unspecified: Secondary | ICD-10-CM

## 2012-07-07 DIAGNOSIS — I10 Essential (primary) hypertension: Secondary | ICD-10-CM

## 2012-07-07 DIAGNOSIS — E119 Type 2 diabetes mellitus without complications: Secondary | ICD-10-CM

## 2012-07-07 LAB — GLUCOSE, CAPILLARY: Glucose-Capillary: 77 mg/dL (ref 70–99)

## 2012-07-07 LAB — POCT GLYCOSYLATED HEMOGLOBIN (HGB A1C): Hemoglobin A1C: 12.5

## 2012-07-07 NOTE — Progress Notes (Signed)
  Subjective:    [name], female    DOB: 07/27/1941, 71 y.o.   MRN: [id number]  HPI  1. DM. Usually compliant with insulin. She sometimes gets too busy caring for family to remember to take it. When she does, has had hypoglycemia before. A1c is improved from 14 today. Lab Results  Component Value Date   HGBA1C 12.5 07/07/2012   2. HTN. At home measures 120/80 consistently. Compliant with medications, denies side effects. Tolerating clonidine at higher dose. No increase edema, dyspnea, chest pains.  3. Left bakers cyst. The pain and swelling improved after steroid knee injection at visit 6 weeks ago. Now has started to return. Denies redness, warmth, fever, calf pain, trauma.  4. Dysuria. Seen 2 weeks ago, treated for yeast infection. This has cleared.   5. Back pain. Having burning sensation at the site of her stimulator. She is scheduled to f/u with [facility] to evaluate this. No incontinence, weakness, falls.  Review of Systems See HPI otherwise negative.  reports that she has never smoked. She does not have any smokeless tobacco history on file.     Objective:   Physical Exam  Vitals reviewed. Constitutional: She is oriented to person, place, and time. She appears well-developed and well-nourished. No distress.  HENT:  Head: Normocephalic and atraumatic.  Eyes: EOM are normal.  Neck: Neck supple.  Cardiovascular: Normal rate, regular rhythm and normal heart sounds.   No murmur heard. Pulmonary/Chest: Effort normal and breath sounds normal. No respiratory distress. She has no wheezes.  Abdominal: Soft.  Musculoskeletal: She exhibits no tenderness.       Left knee mild effusion noted. No warmth or redness.  Feet without callus or lesion. Sensation diminished.  No TTP on the back.  Neurological: She is alert and oriented to person, place, and time. No cranial nerve deficit. She exhibits normal muscle tone. Coordination normal.  Skin: No rash noted.    Psychiatric: She has a normal mood and affect. Thought content normal.          Assessment & Plan:

## 2012-07-07 NOTE — Patient Instructions (Addendum)
Your a1c is improved. Main thing is to take your insulin. Please bring a log of blood sugars to discuss at next visit. You may discuss your knee swelling/baker's cyst with Orthopedic specialist. Goal for your blood pressure is <135/85. Please write down these measurements from home and bring to clinic in one month. Appointment in one month.   Diabetes and Exercise Regular exercise is important and can help:   Control blood glucose (sugar).   Decrease blood pressure.    Control blood lipids (cholesterol, triglycerides).   Improve overall health.  BENEFITS FROM EXERCISE  Improved fitness.   Improved flexibility.   Improved endurance.   Increased bone density.   Weight control.   Increased muscle strength.   Decreased body fat.   Improvement of the body's use of insulin, a hormone.   Increased insulin sensitivity.   Reduction of insulin needs.   Reduced stress and tension.   Helps you feel better.  People with diabetes who add exercise to their lifestyle gain additional benefits, including:  Weight loss.   Reduced appetite.   Improvement of the body's use of blood glucose.   Decreased risk factors for heart disease:   Lowering of cholesterol and triglycerides.   Raising the level of good cholesterol (high-density lipoproteins, HDL).   Lowering blood sugar.   Decreased blood pressure.  TYPE 1 DIABETES AND EXERCISE  Exercise will usually lower your blood glucose.   If blood glucose is greater than 240 mg/dl, check urine ketones. If ketones are present, do not exercise.   Location of the insulin injection sites may need to be adjusted with exercise. Avoid injecting insulin into areas of the body that will be exercised. For example, avoid injecting insulin into:   The arms when playing tennis.   The legs when jogging. For more information, discuss this with your caregiver.   Keep a record of:   Food intake.   Type and amount of exercise.    Expected peak times of insulin action.   Blood glucose levels.  Do this before, during, and after exercise. Review your records with your caregiver. This will help you to develop guidelines for adjusting food intake and insulin amounts.  TYPE 2 DIABETES AND EXERCISE  Regular physical activity can help control blood glucose.   Exercise is important because it may:   Increase the body's sensitivity to insulin.   Improve blood glucose control.   Exercise reduces the risk of heart disease. It decreases serum cholesterol and triglycerides. It also lowers blood pressure.   Those who take insulin or oral hypoglycemic agents should watch for signs of hypoglycemia. These signs include dizziness, shaking, sweating, chills, and confusion.   Body water is lost during exercise. It must be replaced. This will help to avoid loss of body fluids (dehydration) or heat stroke.  Be sure to talk to your caregiver before starting an exercise program to make sure it is safe for you. Remember, any activity is better than none.  Document Released: 01/24/2004 Document Revised: 10/23/2011 Document Reviewed: 05/10/2009 [facility] Patient Information 2012 [place].

## 2012-07-08 NOTE — Assessment & Plan Note (Signed)
At goal. No changes.  

## 2012-07-08 NOTE — Assessment & Plan Note (Signed)
Will recurrence of edema and likely baker's cyst after steroid injection, advised patient to f/u with her orthopedist. If new referral is needed, please contact office.

## 2012-07-08 NOTE — Assessment & Plan Note (Signed)
Poor control but improved. When patient is motivated to take insulin, she is well controlled as evidenced by CBG today. Discussed continuation of therapy, exercise, importance of taking care of herself while she is having caretaker fatigue (her 79+ yo mother and son with special needs). Reminded of need for eye doctor visit. F/u in one month.  Basename 07/07/12 [id number]  GLUCAP [id number]

## 2012-07-08 NOTE — Assessment & Plan Note (Signed)
Seems not well controlled. Will discuss with patient increase in celexa or addition of wellbutrin at next visit if she is willing. Also importance of daily exercise, time for self with care taker fatigue.

## 2012-07-20 ENCOUNTER — Emergency Department (INDEPENDENT_AMBULATORY_CARE_PROVIDER_SITE_OTHER): Payer: Medicare Other

## 2012-07-20 ENCOUNTER — Emergency Department (INDEPENDENT_AMBULATORY_CARE_PROVIDER_SITE_OTHER)
Admission: EM | Admit: 2012-07-20 | Discharge: 2012-07-20 | Disposition: A | Discharge status: Home / Self Care | Payer: Medicare Other | Source: Home / Self Care | Attending: Family Medicine | Admitting: Family Medicine

## 2012-07-20 ENCOUNTER — Encounter (HOSPITAL_COMMUNITY): Payer: Self-pay | Admitting: *Deleted

## 2012-07-20 DIAGNOSIS — S40029A Contusion of unspecified upper arm, initial encounter: Secondary | ICD-10-CM

## 2012-07-20 DIAGNOSIS — S8002XA Contusion of left knee, initial encounter: Secondary | ICD-10-CM

## 2012-07-20 DIAGNOSIS — S40019A Contusion of unspecified shoulder, initial encounter: Secondary | ICD-10-CM

## 2012-07-20 DIAGNOSIS — S8000XA Contusion of unspecified knee, initial encounter: Secondary | ICD-10-CM

## 2012-07-20 DIAGNOSIS — IMO0002 Reserved for concepts with insufficient information to code with codable children: Secondary | ICD-10-CM

## 2012-07-20 NOTE — ED Provider Notes (Signed)
History     CSN: [id number]  Arrival date & time 07/20/12  1637   First MD Initiated Contact with Patient 07/20/12 1638      Chief Complaint  Patient presents with  . Fall  . Shoulder Injury  . Knee Injury    (Consider location/radiation/quality/duration/timing/severity/associated sxs/prior treatment) Patient is a 71 y.o. female presenting with fall. The history is provided by the patient.  Fall The accident occurred 6 to 12 hours ago. The fall occurred while standing (trying to get dog out of car and fell onto left shoulder and knee, now with pain.). She landed on concrete. The point of impact was the left shoulder and left knee. The pain is present in the left shoulder and left knee. The pain is moderate. She was ambulatory at the scene. The symptoms are aggravated by use of the injured limb.    Past Medical History  Diagnosis Date  . H. pylori infection 2008 and 1998    treated  . Multinodular goiter   . Hx of colonoscopy with polypectomy   . Diabetes mellitus   . Depression   . Hyperlipidemia   . Hypertension   . Arthritis     Past Surgical History  Procedure Date  . Rotator cuff   . Rotator cuff repair     right  . Total knee arthroplasty   . Appendectomy   . Abdominal hysterectomy   . Back surgery 2009    implantable stimulator  . Thyroid fna 2000    benign  . Lipoma excision     right elbow  . Tonsillectomy     Family History  Problem Relation Age of Onset  . Cancer Mother     breast  . Muscular dystrophy Son   . Cancer Maternal Grandmother     breast  . Heart disease Maternal Grandfather     History  Substance Use Topics  . Smoking status: Never Smoker   . Smokeless tobacco: Not on file  . Alcohol Use: No    OB History    Grav Para Term Preterm Abortions TAB SAB Ect Mult Living                  Review of Systems  Constitutional: Negative.   Musculoskeletal: Positive for joint swelling. Negative for gait problem.    Allergies    Morphine; Rosiglitazone maleate; and Tramadol-acetaminophen  Home Medications   Current Outpatient Rx  Name Route Sig Dispense Refill  . ASPIRIN 81 MG PO CHEW Oral Chew 81 mg by mouth daily.      . ATORVASTATIN CALCIUM 20 MG PO TABS  TAKE 1 TABLET AT BEDTIME 90 tablet 2  . CALCIUM CARBONATE-VITAMIN D 600-400 MG-UNIT PO TABS Oral Take 1 tablet by mouth 2 (two) times daily.      Marland Kitchen CITALOPRAM HYDROBROMIDE 20 MG PO TABS  TAKE 1 TABLET DAILY 90 tablet 0  . CLONIDINE HCL 0.1 MG PO TABS Oral Take 2 tablets (0.2 mg total) by mouth 2 (two) times daily. 180 tablet 1  . EXENATIDE 10 MCG/0.04ML [place] SOLN Subcutaneous Inject 0.04 mLs (10 mcg total) into the skin 2 (two) times daily. 1.2 mL 5  . INSULIN ASPART 100 UNIT/ML [place] SOLN  Inject 45 units prior to breakfast, 40 units prior to lunch and 40 units prior to evening meal. 10 mL 12  . INSULIN GLARGINE 100 UNIT/ML [place] SOLN Subcutaneous Inject 60 Units into the skin 2 (two) times daily. Disp QS for 1 month 10  mL 5  . INSULIN PEN NEEDLE 31G X 8 MM MISC Does not apply by Does not apply route as directed. Disp 1 box     . LEVOTHYROXINE SODIUM 50 MCG PO TABS Oral Take 1 tablet (50 mcg total) by mouth daily. 90 tablet 0  . LORATADINE 10 MG PO TABS Oral Take 10 mg by mouth daily.      Marland Kitchen LOSARTAN POTASSIUM-HCTZ 100-25 MG PO TABS  TAKE 1 TABLET DAILY 90 tablet 2  . FISH OIL 1000 MG PO CAPS Oral Take 1,000 mg by mouth 2 (two) times daily. OTC fish oil     . TRIAMCINOLONE ACETONIDE 0.1 % EX CREA Topical Apply topically 2 (two) times daily. Apply to affected areas on legs       BP 173/87  Pulse 69  Temp 97.2 F (36.2 C) (Oral)  Resp 20  SpO2 97%  Physical Exam  Nursing note and vitals reviewed. Constitutional: She is oriented to person, place, and time. She appears well-developed and well-nourished.  HENT:  Head: Normocephalic and atraumatic.  Eyes: Pupils are equal, round, and reactive to light.  Neck: Normal range of motion. Neck supple.   Pulmonary/Chest: She exhibits no tenderness.  Abdominal: There is no tenderness.  Musculoskeletal: She exhibits tenderness.       Left shoulder: She exhibits decreased range of motion, tenderness, bony tenderness, swelling and pain. She exhibits normal pulse.       Left knee: She exhibits ecchymosis. She exhibits normal range of motion.       Legs: Neurological: She is alert and oriented to person, place, and time.  Skin: Skin is warm and dry.    ED Course  Procedures (including critical care time)  Labs Reviewed - No data to display Dg Shoulder Left  07/20/2012  *RADIOLOGY REPORT*  Clinical Data: Recent left shoulder injury  LEFT SHOULDER - 2+ VIEW  Comparison: None.  Findings: No fracture or dislocation. Joint spaces are preserved. No definite evidence of calcific tendonitis. Limited visualization of the adjacent thorax suggesting spinal stimulator lead overlying the mid thoracic spine.  Regional soft tissues are normal.  IMPRESSION: No acute findings.   Original Report Authenticated By: Rachel Moulds, M.D.      1. Contusion, shoulder /upper arm   2. Contusion of knee, left       MDM  X-rays reviewed and report per radiologist.         Billy Fischer, MD 07/20/12 [phone]

## 2012-07-20 NOTE — ED Notes (Signed)
Pt reports falling while trying to remove dog from car, backwards onto ground hitting shoulder and knee on left side. Pt has trouble extending left arm or moving shoulder.

## 2012-08-05 ENCOUNTER — Other Ambulatory Visit: Payer: Self-pay | Admitting: Orthopedic Surgery

## 2012-08-05 DIAGNOSIS — M25512 Pain in left shoulder: Secondary | ICD-10-CM

## 2012-08-09 ENCOUNTER — Ambulatory Visit
Admission: RE | Admit: 2012-08-09 | Discharge: 2012-08-09 | Disposition: A | Discharge status: Home / Self Care | Payer: Medicare Other | Source: Ambulatory Visit | Attending: Orthopedic Surgery | Admitting: Orthopedic Surgery

## 2012-08-09 DIAGNOSIS — M25512 Pain in left shoulder: Secondary | ICD-10-CM

## 2012-08-09 MED ORDER — IOHEXOL 300 MG/ML  SOLN
7.0000 mL | Freq: Once | INTRAMUSCULAR | Status: AC | PRN
  Administered 2012-08-09: 7 mL via INTRA_ARTICULAR

## 2012-08-11 ENCOUNTER — Encounter: Payer: Self-pay | Admitting: Family Medicine

## 2012-08-11 ENCOUNTER — Ambulatory Visit (INDEPENDENT_AMBULATORY_CARE_PROVIDER_SITE_OTHER): Payer: Medicare Other | Admitting: Family Medicine

## 2012-08-11 VITALS — BP 120/85 | HR 78 | Temp 98.3°F | Ht 64.0 in | Wt 195.0 lb

## 2012-08-11 DIAGNOSIS — F329 Major depressive disorder, single episode, unspecified: Secondary | ICD-10-CM

## 2012-08-11 DIAGNOSIS — IMO0002 Reserved for concepts with insufficient information to code with codable children: Secondary | ICD-10-CM

## 2012-08-11 DIAGNOSIS — E1165 Type 2 diabetes mellitus with hyperglycemia: Secondary | ICD-10-CM

## 2012-08-11 DIAGNOSIS — I10 Essential (primary) hypertension: Secondary | ICD-10-CM

## 2012-08-11 DIAGNOSIS — F3289 Other specified depressive episodes: Secondary | ICD-10-CM

## 2012-08-11 DIAGNOSIS — E118 Type 2 diabetes mellitus with unspecified complications: Secondary | ICD-10-CM

## 2012-08-11 MED ORDER — INSULIN ASPART 100 UNIT/ML ~~LOC~~ SOLN: SUBCUTANEOUS | Status: DC

## 2012-08-11 MED ORDER — INSULIN GLARGINE 100 UNIT/ML ~~LOC~~ SOLN: 60.0000 [IU] | Freq: Two times a day (BID) | SUBCUTANEOUS | Status: DC

## 2012-08-11 MED ORDER — CITALOPRAM HYDROBROMIDE 20 MG PO TABS: 40.0000 mg | ORAL_TABLET | Freq: Every day | ORAL | Status: DC

## 2012-08-11 MED ORDER — EXENATIDE 10 MCG/0.04ML ~~LOC~~ SOPN: 10.0000 ug | PEN_INJECTOR | Freq: Two times a day (BID) | SUBCUTANEOUS | Status: DC

## 2012-08-11 NOTE — Patient Instructions (Addendum)
Nice to see you. Sounds like your diabetes is doing well. I have refilled your insulin.  Will check labs next visit in 1-2 months. Get your eyes checked whenever you can. Try to walk for exercise.

## 2012-08-12 NOTE — Assessment & Plan Note (Signed)
At goal today. No changes. F/u in 2 months.

## 2012-08-12 NOTE — Progress Notes (Signed)
  Subjective:    [name], female    DOB: 1941/11/17, 71 y.o.   MRN: [id number]  HPI  1. DM. States fasting values 90-120 and reviewed log with highest measurement qhs 150-180. She has been compliant with insulin which is the main change since last visit. She requests new pen needle size (34mm ultrafine 32 gauge), but needs to see which company she uses and let me know where to send rx. Denies hypoglycemia, polyuria, polydipsia.  2. F/u fall. She fell onto left side few weeks ago. Seen at [facility] without fracture on xray. Has followed up with her orthopedist and evidently has a full thickness rotator cuff tear. Just diagnosed this week and unsure of management plan. Denies any worsened swelling or redness.  3. Depression. Doing better recently, but still very stressed with her mother's need for care. Mother lives alone and is getting repeated infections, seems like she is starting to give up. Feels like depression could be better, but denies any worsening of symptoms recently.   4. HTN. Compliant with meds. Good at home recently. Denies visual change, dyspnea, edema.  BP Readings from Last 3 Encounters:  08/11/12 120/85  07/20/12 173/87  07/07/12 132/78   Review of Systems See HPI otherwise negative.  reports that she has never smoked. She does not have any smokeless tobacco history on file.     Objective:   Physical Exam  Vitals reviewed. Constitutional: She is oriented to person, place, and time. She appears well-developed and well-nourished. No distress.  HENT:  Head: Normocephalic and atraumatic.  Mouth/Throat: Oropharynx is clear and moist.  Eyes: EOM are normal. Pupils are equal, round, and reactive to light.  Neck: Neck supple.  Cardiovascular: Normal rate, regular rhythm and normal heart sounds.   No murmur heard. Pulmonary/Chest: Effort normal and breath sounds normal. No respiratory distress. She has no wheezes. She has no rales.  Abdominal: Soft.    Musculoskeletal:       Left arm in sling.  No leg edema.  Normal gait.  Neurological: She is alert and oriented to person, place, and time. No cranial nerve deficit.  Skin: No rash noted. She is not diaphoretic.  Psychiatric: Her behavior is normal. Thought content normal.       Tears up when discussing mother. Smiles and normal speech.           Assessment & Plan:

## 2012-08-12 NOTE — Assessment & Plan Note (Signed)
Mainly due to caretaker fatigue. Seems slightly improved, but control could be better. Will increase celexa to 40 mg. Advised patient to call for any side effects and decrease dose. Consider start wellbutrin if no effect or side effects. F/u in 1-2 months or sooner if needed.

## 2012-08-12 NOTE — Assessment & Plan Note (Signed)
Glycemic control is good with insulin compliance. She has run out of novolog completely today (thought she had another vial but doesn't), so one sample vial is given today. Continue previous doses. Reminded about eye exam. Regular podiatry visits q3-4 months. F/u A1c in 2 months. Patient call with diabetic supply company name for new needles to be sent.

## 2012-08-17 ENCOUNTER — Other Ambulatory Visit: Payer: Self-pay | Admitting: Family Medicine

## 2012-08-24 ENCOUNTER — Other Ambulatory Visit: Payer: Self-pay | Admitting: Family Medicine

## 2012-08-30 ENCOUNTER — Other Ambulatory Visit: Payer: Self-pay | Admitting: Orthopedic Surgery

## 2012-09-01 ENCOUNTER — Telehealth: Payer: Self-pay | Admitting: Family Medicine

## 2012-09-01 NOTE — Telephone Encounter (Signed)
Pt is having shoulder surgery on 10/22 at [facility] - wanted to let Dr Verlee Rossetti know

## 2012-09-03 ENCOUNTER — Encounter (HOSPITAL_BASED_OUTPATIENT_CLINIC_OR_DEPARTMENT_OTHER): Payer: Self-pay | Admitting: *Deleted

## 2012-09-03 NOTE — Progress Notes (Signed)
NPO AFTER MN. ARRIVES AT 0600. NEEDS ISTAT AND EKG. WILL TAKE CLONODINE AND SYNTHROID AM OF SURG W/ SIP OF WATER.

## 2012-09-07 ENCOUNTER — Encounter (HOSPITAL_BASED_OUTPATIENT_CLINIC_OR_DEPARTMENT_OTHER): Payer: Self-pay | Admitting: Anesthesiology

## 2012-09-07 ENCOUNTER — Encounter (HOSPITAL_BASED_OUTPATIENT_CLINIC_OR_DEPARTMENT_OTHER): Payer: Self-pay | Admitting: *Deleted

## 2012-09-07 ENCOUNTER — Encounter (HOSPITAL_COMMUNITY)
Admission: RE | Disposition: A | Discharge status: Home / Self Care | Payer: Self-pay | Source: Ambulatory Visit | Attending: Orthopedic Surgery

## 2012-09-07 ENCOUNTER — Ambulatory Visit (HOSPITAL_BASED_OUTPATIENT_CLINIC_OR_DEPARTMENT_OTHER): Payer: Medicare Other | Admitting: Anesthesiology

## 2012-09-07 ENCOUNTER — Observation Stay (HOSPITAL_BASED_OUTPATIENT_CLINIC_OR_DEPARTMENT_OTHER)
Admission: RE | Admit: 2012-09-07 | Discharge: 2012-09-08 | Disposition: A | Discharge status: Home / Self Care | Payer: Medicare Other | Source: Ambulatory Visit | Attending: Orthopedic Surgery | Admitting: Orthopedic Surgery

## 2012-09-07 ENCOUNTER — Other Ambulatory Visit: Payer: Self-pay

## 2012-09-07 DIAGNOSIS — S43429A Sprain of unspecified rotator cuff capsule, initial encounter: Principal | ICD-10-CM | POA: Insufficient documentation

## 2012-09-07 DIAGNOSIS — E1140 Type 2 diabetes mellitus with diabetic neuropathy, unspecified: Secondary | ICD-10-CM

## 2012-09-07 DIAGNOSIS — E119 Type 2 diabetes mellitus without complications: Secondary | ICD-10-CM | POA: Insufficient documentation

## 2012-09-07 DIAGNOSIS — M5430 Sciatica, unspecified side: Secondary | ICD-10-CM

## 2012-09-07 DIAGNOSIS — E669 Obesity, unspecified: Secondary | ICD-10-CM

## 2012-09-07 DIAGNOSIS — F329 Major depressive disorder, single episode, unspecified: Secondary | ICD-10-CM

## 2012-09-07 DIAGNOSIS — M7120 Synovial cyst of popliteal space [Baker], unspecified knee: Secondary | ICD-10-CM

## 2012-09-07 DIAGNOSIS — E7800 Pure hypercholesterolemia, unspecified: Secondary | ICD-10-CM

## 2012-09-07 DIAGNOSIS — E039 Hypothyroidism, unspecified: Secondary | ICD-10-CM

## 2012-09-07 DIAGNOSIS — L84 Corns and callosities: Secondary | ICD-10-CM

## 2012-09-07 DIAGNOSIS — E785 Hyperlipidemia, unspecified: Secondary | ICD-10-CM | POA: Insufficient documentation

## 2012-09-07 DIAGNOSIS — I10 Essential (primary) hypertension: Secondary | ICD-10-CM | POA: Insufficient documentation

## 2012-09-07 DIAGNOSIS — R21 Rash and other nonspecific skin eruption: Secondary | ICD-10-CM

## 2012-09-07 DIAGNOSIS — J309 Allergic rhinitis, unspecified: Secondary | ICD-10-CM

## 2012-09-07 DIAGNOSIS — I872 Venous insufficiency (chronic) (peripheral): Secondary | ICD-10-CM

## 2012-09-07 DIAGNOSIS — M129 Arthropathy, unspecified: Secondary | ICD-10-CM

## 2012-09-07 DIAGNOSIS — X58XXXA Exposure to other specified factors, initial encounter: Secondary | ICD-10-CM | POA: Insufficient documentation

## 2012-09-07 DIAGNOSIS — E739 Lactose intolerance, unspecified: Secondary | ICD-10-CM

## 2012-09-07 DIAGNOSIS — E1165 Type 2 diabetes mellitus with hyperglycemia: Secondary | ICD-10-CM

## 2012-09-07 DIAGNOSIS — Z794 Long term (current) use of insulin: Secondary | ICD-10-CM | POA: Insufficient documentation

## 2012-09-07 DIAGNOSIS — Z79899 Other long term (current) drug therapy: Secondary | ICD-10-CM | POA: Insufficient documentation

## 2012-09-07 HISTORY — DX: Type 2 diabetes mellitus without complications: E11.9

## 2012-09-07 HISTORY — DX: Type 2 diabetes mellitus without complications: Z79.4

## 2012-09-07 HISTORY — DX: Urge incontinence: N39.41

## 2012-09-07 HISTORY — PX: SHOULDER OPEN ROTATOR CUFF REPAIR: SHX2407

## 2012-09-07 LAB — POCT I-STAT 4, (NA,K, GLUC, HGB,HCT)
Glucose, Bld: 328 mg/dL — ABNORMAL HIGH (ref 70–99)
HCT: 40 % (ref 36.0–46.0)
Hemoglobin: 13.6 g/dL (ref 12.0–15.0)
Potassium: 3.2 mEq/L — ABNORMAL LOW (ref 3.5–5.1)

## 2012-09-07 LAB — GLUCOSE, CAPILLARY
Glucose-Capillary: 272 mg/dL — ABNORMAL HIGH (ref 70–99)
Glucose-Capillary: 279 mg/dL — ABNORMAL HIGH (ref 70–99)
Glucose-Capillary: 158 mg/dL — ABNORMAL HIGH (ref 70–99)

## 2012-09-07 SURGERY — REPAIR, ROTATOR CUFF, OPEN
Anesthesia: Regional | Site: Shoulder | Laterality: Left | Wound class: Clean

## 2012-09-07 MED ORDER — INSULIN ASPART 100 UNIT/ML ~~LOC~~ SOLN: 40.0000 [IU] | SUBCUTANEOUS | Status: DC

## 2012-09-07 MED ORDER — INSULIN ASPART 100 UNIT/ML ~~LOC~~ SOLN
45.0000 [IU] | Freq: Every day | SUBCUTANEOUS | Status: DC
  Administered 2012-09-08: 45 [IU] via SUBCUTANEOUS

## 2012-09-07 MED ORDER — CEFAZOLIN SODIUM 1-5 GM-% IV SOLN
1.0000 g | Freq: Four times a day (QID) | INTRAVENOUS | Status: AC
  Administered 2012-09-07 – 2012-09-08 (×3): 1 g via INTRAVENOUS
  Filled 2012-09-07 (×3): qty 50

## 2012-09-07 MED ORDER — PROPOFOL 10 MG/ML IV BOLUS
INTRAVENOUS | Status: DC | PRN
Start: 2012-09-07 — End: 2012-09-07
  Administered 2012-09-07: 160 mg via INTRAVENOUS

## 2012-09-07 MED ORDER — ASPIRIN 81 MG PO CHEW
81.0000 mg | CHEWABLE_TABLET | Freq: Every day | ORAL | Status: DC
  Administered 2012-09-07 – 2012-09-08 (×2): 81 mg via ORAL
  Filled 2012-09-07 (×2): qty 1

## 2012-09-07 MED ORDER — CALCIUM CARBONATE-VITAMIN D 500-200 MG-UNIT PO TABS
1.0000 | ORAL_TABLET | Freq: Two times a day (BID) | ORAL | Status: DC
  Administered 2012-09-07 – 2012-09-08 (×2): 1 via ORAL
  Filled 2012-09-07 (×3): qty 1

## 2012-09-07 MED ORDER — FENTANYL CITRATE 0.05 MG/ML IJ SOLN
INTRAMUSCULAR | Status: DC | PRN
  Administered 2012-09-07 (×2): 50 ug via INTRAVENOUS

## 2012-09-07 MED ORDER — INSULIN ASPART 100 UNIT/ML ~~LOC~~ SOLN
40.0000 [IU] | Freq: Two times a day (BID) | SUBCUTANEOUS | Status: DC
  Administered 2012-09-07: 40 [IU] via SUBCUTANEOUS

## 2012-09-07 MED ORDER — NAPROXEN SODIUM 220 MG PO TABS: 220.0000 mg | ORAL_TABLET | Freq: Two times a day (BID) | ORAL | Status: DC

## 2012-09-07 MED ORDER — ONDANSETRON HCL 4 MG/2ML IJ SOLN
INTRAMUSCULAR | Status: DC | PRN
  Administered 2012-09-07: 4 mg via INTRAVENOUS

## 2012-09-07 MED ORDER — MIDAZOLAM HCL 2 MG/2ML IJ SOLN
2.0000 mg | Freq: Once | INTRAMUSCULAR | Status: AC
  Administered 2012-09-07: 2 mg via INTRAVENOUS

## 2012-09-07 MED ORDER — CALCIUM CARBONATE-VITAMIN D 600-400 MG-UNIT PO TABS: 1.0000 | ORAL_TABLET | Freq: Two times a day (BID) | ORAL | Status: DC

## 2012-09-07 MED ORDER — PROMETHAZINE HCL 25 MG/ML IJ SOLN: 6.2500 mg | INTRAMUSCULAR | Status: DC | PRN

## 2012-09-07 MED ORDER — EPHEDRINE SULFATE 50 MG/ML IJ SOLN
INTRAMUSCULAR | Status: DC | PRN
  Administered 2012-09-07 (×2): 5 mg via INTRAVENOUS

## 2012-09-07 MED ORDER — HYDROCHLOROTHIAZIDE 25 MG PO TABS
25.0000 mg | ORAL_TABLET | Freq: Every day | ORAL | Status: DC
  Administered 2012-09-08: 25 mg via ORAL
  Filled 2012-09-07: qty 1

## 2012-09-07 MED ORDER — HYDROMORPHONE HCL PF 1 MG/ML IJ SOLN: 0.2500 mg | INTRAMUSCULAR | Status: DC | PRN

## 2012-09-07 MED ORDER — LACTATED RINGERS IV SOLN
INTRAVENOUS | Status: DC | PRN
  Administered 2012-09-07 (×2): via INTRAVENOUS

## 2012-09-07 MED ORDER — ATORVASTATIN CALCIUM 20 MG PO TABS
20.0000 mg | ORAL_TABLET | Freq: Every day | ORAL | Status: DC
Start: 2012-09-07 — End: 2012-09-08
  Administered 2012-09-07: 20 mg via ORAL
  Filled 2012-09-07 (×2): qty 1

## 2012-09-07 MED ORDER — EXENATIDE 10 MCG/0.04ML ~~LOC~~ SOPN
10.0000 ug | PEN_INJECTOR | Freq: Two times a day (BID) | SUBCUTANEOUS | Status: DC
  Administered 2012-09-07: 10 ug via SUBCUTANEOUS

## 2012-09-07 MED ORDER — LOSARTAN POTASSIUM-HCTZ 100-25 MG PO TABS: 1.0000 | ORAL_TABLET | Freq: Every day | ORAL | Status: DC

## 2012-09-07 MED ORDER — SODIUM CHLORIDE 0.9 % IR SOLN
Status: DC | PRN
  Administered 2012-09-07: 1

## 2012-09-07 MED ORDER — OXYCODONE HCL 5 MG PO TABS: 5.0000 mg | ORAL_TABLET | Freq: Once | ORAL | Status: DC | PRN

## 2012-09-07 MED ORDER — METOCLOPRAMIDE HCL 10 MG PO TABS: 5.0000 mg | ORAL_TABLET | Freq: Three times a day (TID) | ORAL | Status: DC | PRN

## 2012-09-07 MED ORDER — ONDANSETRON HCL 4 MG/2ML IJ SOLN
4.0000 mg | Freq: Four times a day (QID) | INTRAMUSCULAR | Status: DC
  Administered 2012-09-07: 4 mg via INTRAVENOUS
  Filled 2012-09-07 (×2): qty 2

## 2012-09-07 MED ORDER — NAPROXEN 250 MG PO TABS
250.0000 mg | ORAL_TABLET | Freq: Two times a day (BID) | ORAL | Status: DC
  Administered 2012-09-07 – 2012-09-08 (×2): 250 mg via ORAL
  Filled 2012-09-07 (×4): qty 1

## 2012-09-07 MED ORDER — ACETAMINOPHEN 10 MG/ML IV SOLN: 1000.0000 mg | Freq: Once | INTRAVENOUS | Status: DC | PRN

## 2012-09-07 MED ORDER — METHOCARBAMOL 500 MG PO TABS
500.0000 mg | ORAL_TABLET | Freq: Four times a day (QID) | ORAL | Status: DC | PRN
  Administered 2012-09-07 – 2012-09-08 (×2): 500 mg via ORAL
  Filled 2012-09-07 (×2): qty 1

## 2012-09-07 MED ORDER — METOCLOPRAMIDE HCL 5 MG/ML IJ SOLN: 5.0000 mg | Freq: Three times a day (TID) | INTRAMUSCULAR | Status: DC | PRN

## 2012-09-07 MED ORDER — POVIDONE-IODINE 7.5 % EX SOLN: Freq: Once | CUTANEOUS | Status: DC

## 2012-09-07 MED ORDER — BUPIVACAINE-EPINEPHRINE PF 0.5-1:200000 % IJ SOLN
INTRAMUSCULAR | Status: DC | PRN
  Administered 2012-09-07: 1 mL

## 2012-09-07 MED ORDER — HYDROMORPHONE HCL PF 1 MG/ML IJ SOLN
1.0000 mg | INTRAMUSCULAR | Status: DC | PRN
  Administered 2012-09-07: 1 mg via INTRAVENOUS
  Filled 2012-09-07 (×2): qty 1

## 2012-09-07 MED ORDER — INSULIN GLARGINE 100 UNIT/ML ~~LOC~~ SOLN
60.0000 [IU] | Freq: Two times a day (BID) | SUBCUTANEOUS | Status: DC
  Administered 2012-09-07 – 2012-09-08 (×3): 60 [IU] via SUBCUTANEOUS

## 2012-09-07 MED ORDER — KETOROLAC TROMETHAMINE 30 MG/ML IJ SOLN
INTRAMUSCULAR | Status: DC | PRN
  Administered 2012-09-07: 30 mg via INTRAVENOUS

## 2012-09-07 MED ORDER — LIDOCAINE HCL (CARDIAC) 20 MG/ML IV SOLN
INTRAVENOUS | Status: DC | PRN
  Administered 2012-09-07: 50 mg via INTRAVENOUS

## 2012-09-07 MED ORDER — SODIUM CHLORIDE 0.45 % IV SOLN
INTRAVENOUS | Status: DC
  Administered 2012-09-07: 13:00:00 via INTRAVENOUS

## 2012-09-07 MED ORDER — OXYCODONE-ACETAMINOPHEN 5-325 MG PO TABS
1.0000 | ORAL_TABLET | ORAL | Status: AC | PRN
  Administered 2012-09-07 – 2012-09-08 (×2): 1 via ORAL
  Filled 2012-09-07 (×3): qty 1

## 2012-09-07 MED ORDER — NEOSTIGMINE METHYLSULFATE 1 MG/ML IJ SOLN
INTRAMUSCULAR | Status: DC | PRN
  Administered 2012-09-07: 4 mg via INTRAVENOUS

## 2012-09-07 MED ORDER — LOSARTAN POTASSIUM 50 MG PO TABS
100.0000 mg | ORAL_TABLET | Freq: Every day | ORAL | Status: DC
  Administered 2012-09-08: 100 mg via ORAL
  Filled 2012-09-07: qty 2

## 2012-09-07 MED ORDER — MEPERIDINE HCL 25 MG/ML IJ SOLN: 6.2500 mg | INTRAMUSCULAR | Status: DC | PRN

## 2012-09-07 MED ORDER — OXYCODONE HCL 5 MG/5ML PO SOLN: 5.0000 mg | Freq: Once | ORAL | Status: DC | PRN

## 2012-09-07 MED ORDER — LEVOTHYROXINE SODIUM 50 MCG PO TABS
50.0000 ug | ORAL_TABLET | Freq: Every day | ORAL | Status: DC
  Administered 2012-09-08: 50 ug via ORAL
  Filled 2012-09-07 (×2): qty 1

## 2012-09-07 MED ORDER — CITALOPRAM HYDROBROMIDE 20 MG PO TABS
20.0000 mg | ORAL_TABLET | Freq: Every day | ORAL | Status: DC
  Administered 2012-09-07 – 2012-09-08 (×2): 20 mg via ORAL
  Filled 2012-09-07 (×2): qty 1

## 2012-09-07 MED ORDER — ROCURONIUM BROMIDE 100 MG/10ML IV SOLN
INTRAVENOUS | Status: DC | PRN
  Administered 2012-09-07: 35 mg via INTRAVENOUS

## 2012-09-07 MED ORDER — CEFAZOLIN SODIUM-DEXTROSE 2-3 GM-% IV SOLR
INTRAVENOUS | Status: DC | PRN
Start: 2012-09-07 — End: 2012-09-07
  Administered 2012-09-07: 2 g via INTRAVENOUS

## 2012-09-07 MED ORDER — ROPIVACAINE HCL 5 MG/ML IJ SOLN
INTRAMUSCULAR | Status: DC | PRN
Start: 2012-09-07 — End: 2012-09-07
  Administered 2012-09-07: 30 mL

## 2012-09-07 MED ORDER — LORATADINE 10 MG PO TABS
10.0000 mg | ORAL_TABLET | Freq: Every day | ORAL | Status: DC
  Administered 2012-09-07 – 2012-09-08 (×2): 10 mg via ORAL
  Filled 2012-09-07 (×2): qty 1

## 2012-09-07 MED ORDER — GLYCOPYRROLATE 0.2 MG/ML IJ SOLN
INTRAMUSCULAR | Status: DC | PRN
  Administered 2012-09-07: 0.6 mg via INTRAVENOUS

## 2012-09-07 MED ORDER — LACTATED RINGERS IV SOLN
INTRAVENOUS | Status: DC
  Administered 2012-09-07: 07:00:00 via INTRAVENOUS

## 2012-09-07 MED ORDER — CEFAZOLIN SODIUM-DEXTROSE 2-3 GM-% IV SOLR: 2.0000 g | INTRAVENOUS | Status: DC

## 2012-09-07 MED ORDER — INSULIN ASPART 100 UNIT/ML ~~LOC~~ SOLN
7.0000 [IU] | Freq: Once | SUBCUTANEOUS | Status: AC
  Administered 2012-09-07: 7 [IU] via SUBCUTANEOUS

## 2012-09-07 MED ORDER — ONDANSETRON HCL 4 MG PO TABS: 4.0000 mg | ORAL_TABLET | Freq: Four times a day (QID) | ORAL | Status: DC | PRN

## 2012-09-07 MED ORDER — CLONIDINE HCL 0.1 MG PO TABS
0.1000 mg | ORAL_TABLET | Freq: Every day | ORAL | Status: DC
  Administered 2012-09-07 – 2012-09-08 (×2): 0.1 mg via ORAL
  Filled 2012-09-07 (×2): qty 1

## 2012-09-07 MED ORDER — ONDANSETRON HCL 4 MG/2ML IJ SOLN
4.0000 mg | Freq: Four times a day (QID) | INTRAMUSCULAR | Status: DC | PRN
  Administered 2012-09-07: 4 mg via INTRAVENOUS

## 2012-09-07 SURGICAL SUPPLY — 63 items
ANCH SUT 2 5.5 BABSR ASCP (Orthopedic Implant) ×1 IMPLANT
ANCHOR PEEK ZIP 5.5 NDL NO2 (Orthopedic Implant) ×2 IMPLANT
APL SKNCLS STERI-STRIP NONHPOA (GAUZE/BANDAGES/DRESSINGS)
BENZOIN TINCTURE PRP APPL 2/3 (GAUZE/BANDAGES/DRESSINGS) IMPLANT
BLADE FLAT COURSE (BLADE) ×2 IMPLANT
BLADE SURG 10 STRL SS (BLADE) ×2 IMPLANT
BLADE SURG 15 STRL LF DISP TIS (BLADE) ×1 IMPLANT
BLADE SURG 15 STRL SS (BLADE) ×2
CANISTER SUCTION 1200CC (MISCELLANEOUS) ×2 IMPLANT
CLEANER CAUTERY TIP 5X5 PAD (MISCELLANEOUS) ×1 IMPLANT
CLOTH BEACON ORANGE TIMEOUT ST (SAFETY) ×2 IMPLANT
DRAPE INCISE IOBAN 66X45 STRL (DRAPES) ×2 IMPLANT
DRAPE LG THREE QUARTER DISP (DRAPES) ×2 IMPLANT
DRAPE ORTHO SPLIT 77X108 STRL (DRAPES)
DRAPE SHOULDER BEACH CHAIR (DRAPES) ×2 IMPLANT
DRAPE SURG ORHT 6 SPLT 77X108 (DRAPES) IMPLANT
DRAPE U-SHAPE 47X51 STRL (DRAPES) ×2 IMPLANT
DRSG PAD ABDOMINAL 8X10 ST (GAUZE/BANDAGES/DRESSINGS) IMPLANT
DURAPREP 26ML APPLICATOR (WOUND CARE) ×4 IMPLANT
ELECT REM PT RETURN 9FT ADLT (ELECTROSURGICAL) ×2
ELECTRODE REM PT RTRN 9FT ADLT (ELECTROSURGICAL) ×1 IMPLANT
GLOVE BIOGEL M 7.0 STRL (GLOVE) ×4 IMPLANT
GLOVE BIOGEL PI IND STRL 8 (GLOVE) ×1 IMPLANT
GLOVE BIOGEL PI INDICATOR 8 (GLOVE) ×1
GLOVE ECLIPSE 6.0 STRL STRAW (GLOVE) ×2 IMPLANT
GLOVE ECLIPSE 8.0 STRL XLNG CF (GLOVE) ×4 IMPLANT
GLOVE INDICATOR 6.5 STRL GRN (GLOVE) ×2 IMPLANT
GLOVE INDICATOR 8.0 STRL GRN (GLOVE) ×2 IMPLANT
GOWN PREVENTION PLUS LG XLONG (DISPOSABLE) ×2 IMPLANT
GOWN STRL REIN XL XLG (GOWN DISPOSABLE) ×2 IMPLANT
NEEDLE 1/2 CIR CATGUT .05X1.09 (NEEDLE) IMPLANT
NS IRRIG 500ML POUR BTL (IV SOLUTION) ×2 IMPLANT
PACK ARTHROSCOPY DSU (CUSTOM PROCEDURE TRAY) ×2 IMPLANT
PACK BASIN DAY SURGERY FS (CUSTOM PROCEDURE TRAY) ×2 IMPLANT
PAD CLEANER CAUTERY TIP 5X5 (MISCELLANEOUS) ×1
PENCIL BUTTON HOLSTER BLD 10FT (ELECTRODE) ×2 IMPLANT
SLING ARM FOAM STRAP LRG (SOFTGOODS) ×2 IMPLANT
SLING ARM IMMOBILIZER LRG (SOFTGOODS) IMPLANT
SLING ARM IMMOBILIZER MED (SOFTGOODS) IMPLANT
SPONGE GAUZE 4X4 12PLY (GAUZE/BANDAGES/DRESSINGS) ×2 IMPLANT
SPONGE LAP 4X18 X RAY DECT (DISPOSABLE) ×2 IMPLANT
SPONGE SURGIFOAM ABS GEL 100 (HEMOSTASIS) IMPLANT
STAPLER VISISTAT (STAPLE) ×2 IMPLANT
STAPLER VISISTAT 35W (STAPLE) IMPLANT
STRIP CLOSURE SKIN 1/2X4 (GAUZE/BANDAGES/DRESSINGS) IMPLANT
SUCTION FRAZIER TIP 10 FR DISP (SUCTIONS) ×2 IMPLANT
SUT BONE WAX W31G (SUTURE) ×2 IMPLANT
SUT ETHIBOND GREEN BRAID 0S 4 (SUTURE) IMPLANT
SUT ETHILON 4 0 PS 2 18 (SUTURE) IMPLANT
SUT VIC AB 0 CT1 36 (SUTURE) ×2 IMPLANT
SUT VIC AB 1 CT1 36 (SUTURE) ×4 IMPLANT
SUT VIC AB 2-0 CT1 27 (SUTURE) ×2
SUT VIC AB 2-0 CT1 TAPERPNT 27 (SUTURE) ×1 IMPLANT
SUT VIC AB 3-0 CT1 27 (SUTURE)
SUT VIC AB 3-0 CT1 TAPERPNT 27 (SUTURE) IMPLANT
SUT VIC AB 3-0 SH 27 (SUTURE)
SUT VIC AB 3-0 SH 27X BRD (SUTURE) IMPLANT
SUT VICRYL 4-0 PS2 18IN ABS (SUTURE) IMPLANT
SYR BULB IRRIGATION 50ML (SYRINGE) ×2 IMPLANT
TAPE CLOTH SURG 4X10 WHT LF (GAUZE/BANDAGES/DRESSINGS) ×2 IMPLANT
TAPE HYPAFIX 6X30 (GAUZE/BANDAGES/DRESSINGS) IMPLANT
TOWEL OR 17X24 6PK STRL BLUE (TOWEL DISPOSABLE) ×4 IMPLANT
WATER STERILE IRR 500ML POUR (IV SOLUTION) IMPLANT

## 2012-09-07 NOTE — Transfer of Care (Signed)
Immediate Anesthesia Transfer of Care Note  Patient: Sara Graves  Procedure(s) Performed: Procedure(s) (LRB): ROTATOR CUFF REPAIR SHOULDER OPEN (Left)  Patient Location: PACU  Anesthesia Type: General  Level of Consciousness: awake, sedated, patient cooperative and responds to stimulation  Airway & Oxygen Therapy: Patient Spontanous Breathing and Patient connected to face mask oxygen  Post-op Assessment: Report given to PACU RN, Post -op Vital signs reviewed and stable and Patient moving all extremities  Post vital signs: Reviewed and stable  Complications: No apparent anesthesia complications

## 2012-09-07 NOTE — Brief Op Note (Signed)
09/07/2012  9:16 AM  PATIENT:  Sara Graves  71 y.o. female  PRE-OPERATIVE DIAGNOSIS:  left shoulder rotator cuff tear   POST-OPERATIVE DIAGNOSIS:  Left shoulder rotator cuff tear   PROCEDURE:  Procedure(s) (LRB) with comments: ROTATOR CUFF REPAIR SHOULDER OPEN (Left) - OPEN ANTERIOR ACROMIONECTOMY AND ROTATOR CUFF REPAIR ON LEFT   SURGEON:  Surgeon(s) and Role:    * Magnus Sinning, MD - Primary  PHYSICIAN ASSISTANT:   ASSISTANTS  Extra scrub nurse   ANESTHESIA:   regional and general  EBL:  Total I/O In: 1000 [I.V.:1000] Out: -   BLOOD ADMINISTERED:none  DRAINS: none   LOCAL MEDICATIONS USED:  NONE  SPECIMEN:  No Specimen  DISPOSITION OF SPECIMEN:  N/A  COUNTS:  YES  TOURNIQUET:  * No tourniquets in log *  DICTATION: .Other Dictation: Dictation Number [id number]  PLAN OF CARE: Admit for overnight observation  PATIENT DISPOSITION:  PACU - hemodynamically stable.   Delay start of Pharmacological VTE agent (>24hrs) due to surgical blood loss or risk of bleeding: yes

## 2012-09-07 NOTE — Op Note (Signed)
Sara Graves, Sara Graves               ACCOUNT NO.:  [id number]  MEDICAL RECORD NO.:  [id number]  LOCATION:  [id number]                         FACILITY:  [facility]  PHYSICIAN:  Tarri Glenn, M.D.  DATE OF BIRTH:  22-Sep-1941  DATE OF PROCEDURE:  09/07/2012 DATE OF DISCHARGE:                              OPERATIVE REPORT   PREOPERATIVE DIAGNOSIS:  Torn rotator cuff, left shoulder.  POSTOPERATIVE DIAGNOSIS:  Torn rotator cuff, left shoulder.  OPERATION:  Open anterior acromionectomy and repair of torn rotator cuff, left shoulder.  SURGEON:  Tarri Glenn, M.D.  ASSISTANT:  Extra scrub tech.  ANESTHESIA:  General anesthesia preceded by interscalene block.  PATHOLOGY AND JUSTIFICATION FOR PROCEDURE:  I had repaired torn rotator cuff in her right shoulder in 2004.  Left shoulder has been painful with an arthrogram demonstrating a full-thickness rotator cuff tear.  The tear surgery was involved from front to back basically the entire rotator cuff.  Biceps tendon was intact, there was significant impingement.  PROCEDURE:  Satisfactory interscalene block by Anesthesia, satisfied general anesthesia, beach-chair position on the sliding frame, left shoulder griddle was prepped with DuraPrep and draped in sterile field. Ioban employed.  Time-out performed.  Vertical midline incision from the [facility] joint and the greater tuberosity down to the fascia overlying the anterior acromion, which I opened with cutting cautery, reflecting it anteriorly and posteriorly.  I then placed a small Cobb elevator beneath the anterior acromion.  The joint fluid coming forth confirming the tear.  I then placed a larger Cobb elevator and perform my initial anterior acromionectomy with a microsaw.  Then because of still residual impingement, I removed additional bone with two separate cuts and also smoothed down the underneath surface with a rasp.  This completely corrected her impingement problem.  Rotator cuff tear  was identified as noted size-wise above.  Biceps tendon was intact.  With small rongeur, I denuded the articular cartilage of the greater tuberosity and then started out with a single 4-stranded Stryker rotator cuff anchor.  The retracted tendon, which was about 3 cm, I was able to retrieve and tacked down initially with the first 4 strands and healthy tissue and then I supplement this with individual ties through the terminal portion and the lateral humeral soft tissue.  This seemed to give a nice, stable repair with her arm down to her side.  After irrigating the wound well with sterile saline, I then closed with interrupted #1 Vicryl and the small slit in the deltoid muscle and in the fascia over the anterior acromion followed by 2-0 Vicryl in the subcutaneous tissue and staples in the skin.  Betadine, Adaptic, dry sterile dressing followed by shoulder immobilizer were applied.  She tolerated the procedure well, was taken to the recovery room in satisfactory condition with no known complications.          ______________________________ Tarri Glenn, M.D.     JA/MEDQ  D:  09/07/2012  T:  09/07/2012  Job:  [id number]

## 2012-09-07 NOTE — Progress Notes (Signed)
Pt transfer from [facility] to room 1603 vua stretcher w IV & O2 @ 2L/min. Voices no c/o pain or discomfort. Tolerated well.

## 2012-09-07 NOTE — Progress Notes (Signed)
Pt report given to [facility] / room [phone].

## 2012-09-07 NOTE — Progress Notes (Signed)
Utilization review completed.  

## 2012-09-07 NOTE — Anesthesia Preprocedure Evaluation (Addendum)
Anesthesia Evaluation  Patient identified by MRN, date of birth, ID band Patient awake    Reviewed: Allergy & Precautions, H&P , NPO status , Patient's Chart, lab work & pertinent test results  Airway Mallampati: II TM Distance: >3 FB Neck ROM: Full    Dental  (+) Dental Advisory Given, Edentulous Upper and Partial Lower   Pulmonary neg pulmonary ROS,  breath sounds clear to auscultation  Pulmonary exam normal       Cardiovascular hypertension, Pt. on medications - CHF Rhythm:Regular Rate:Normal     Neuro/Psych PSYCHIATRIC DISORDERS Diabetic neuropathy  Neuromuscular disease    GI/Hepatic negative GI ROS, Neg liver ROS,   Endo/Other  diabetes, Poorly Controlled, Type 2, Insulin DependentHypothyroidism   Renal/GU      Musculoskeletal  (+) Arthritis -, Osteoarthritis,    Abdominal (+) + obese,   Peds  Hematology   Anesthesia Other Findings   Reproductive/Obstetrics                         Anesthesia Physical Anesthesia Plan  ASA: III  Anesthesia Plan: General and Regional   Post-op Pain Management:    Induction: Intravenous  Airway Management Planned: Oral ETT  Additional Equipment:   Intra-op Plan:   Post-operative Plan: Extubation in OR  Informed Consent: I have reviewed the patients History and Physical, chart, labs and discussed the procedure including the risks, benefits and alternatives for the proposed anesthesia with the patient or authorized representative who has indicated his/her understanding and acceptance.   Dental advisory given  Plan Discussed with: CRNA  Anesthesia Plan Comments:        Anesthesia Quick Evaluation

## 2012-09-07 NOTE — Anesthesia Postprocedure Evaluation (Signed)
Anesthesia Post Note  Patient: Sara Graves  Procedure(s) Performed: Procedure(s) (LRB): ROTATOR CUFF REPAIR SHOULDER OPEN (Left)  Anesthesia type: General  Patient location: PACU  Post pain: Pain level controlled  Post assessment: Post-op Vital signs reviewed  Last Vitals: BP 108/69  Pulse 70  Temp 36 C (Oral)  Resp 16  Ht 5\' 4"  (1.626 m)  Wt 195 lb (88.451 kg)  BMI 33.47 kg/m2  SpO2 96%  Post vital signs: Reviewed  Level of consciousness: sedated  Complications: Pt with very poorly controlled DM. Pt given SSI coverage for hyperglycemia prior to surgery. Blood glc trending down. Discussed pt continuing normal routine tonight and f/u with PCP soon as she may need dose adjustment. No apparent anesthesia complications

## 2012-09-07 NOTE — H&P (Signed)
Sara Graves is an 71 y.o. female.   Chief Complaint: painful lt shoulder HPI: torn rotator cuff on arthrogram  Past Medical History  Diagnosis Date  . H. pylori infection 2008 and 1998    treated  . Multinodular goiter   . Hx of colonoscopy with polypectomy   . Depression   . Hyperlipidemia   . Hypertension   . Arthritis   . Urge urinary incontinence   . Diabetes mellitus, type II, insulin dependent     Past Surgical History  Procedure Date  . Total knee arthroplasty 05-06-2000    OA RIGHT KNEE  . Lipoma excision     RIGHT ELBOW  . Excision ganglion cyst left ring finger 01-17-2009  . Implantation permanent spinal cord stimulator 06-15-2008    JUNE 2013--  BATTERY CHANGE  . Foraminal decompression at l2 to the sacrum 01-05-2008    L2  -  S1  . Rotator cuff repair 03-28-2003    RIGHT SHOULDER  DEGENERATIVE AC JOINT AND RC TEAR  . Bladder neck suspension 1970'S  . Bilateral bunionectomies   . Hammertoe repair     BILATERAL  . Vaginal hysterectomy 1970'S    W/ BILATERAL SALPINGO-OOPHORECTOMY  . Appendectomy AGE 71  . Tonsillectomy AGE 22    Family History  Problem Relation Age of Onset  . Cancer Mother     breast  . Muscular dystrophy Son   . Cancer Maternal Grandmother     breast  . Heart disease Maternal Grandfather    Social History:  reports that she has never smoked. She has never used smokeless tobacco. She reports that she does not drink alcohol or use illicit drugs.  Allergies:  Allergies  Allergen Reactions  . Morphine Other (See Comments)    SEVERE HYPOTENSION   . Rosiglitazone Maleate Swelling      (ANTIDIABETIC MED.)  . Tramadol-Acetaminophen Rash    Medications Prior to Admission  Medication Sig Dispense Refill  . acetaminophen (TYLENOL) 325 MG tablet Take 650 mg by mouth every 6 (six) hours as needed.      Marland Kitchen aspirin (BAYER CHILDRENS ASPIRIN) 81 MG chewable tablet Chew 81 mg by mouth daily.       Marland Kitchen atorvastatin (LIPITOR) 20 MG tablet  TAKE 1 TABLET AT BEDTIME  90 tablet  2  . Calcium Carbonate-Vitamin D (CALTRATE 600+D) 600-400 MG-UNIT per tablet Take 1 tablet by mouth 2 (two) times daily.       . citalopram (CELEXA) 20 MG tablet Take 20 mg by mouth daily.      . cloNIDine (CATAPRES) 0.1 MG tablet       . exenatide (BYETTA 10 MCG PEN) 10 MCG/0.04ML SOLN Inject 0.04 mLs (10 mcg total) into the skin 2 (two) times daily.  1.2 mL  11  . HYDROcodone-acetaminophen (NORCO/VICODIN) 5-325 MG per tablet Take 1 tablet by mouth every 6 (six) hours as needed.      . insulin aspart (NOVOLOG) 100 UNIT/ML injection as directed. Inject 45 units prior to breakfast, 40 units prior to lunch and 40 units prior to evening meal.      . insulin glargine (LANTUS) 100 UNIT/ML injection Inject 60 Units into the skin 2 (two) times daily. Disp QS for 1 month  10 mL  11  . levothyroxine (SYNTHROID, LEVOTHROID) 50 MCG tablet       . losartan-hydrochlorothiazide (HYZAAR) 100-25 MG per tablet TAKE 1 TABLET DAILY  90 tablet  2  . naproxen sodium (ANAPROX) 220 MG tablet  Take 220 mg by mouth as needed.      . Omega-3 Fatty Acids (FISH OIL) 1000 MG CAPS Take 1,000 mg by mouth 2 (two) times daily. OTC fish oil      . loratadine (CLARITIN) 10 MG tablet Take 10 mg by mouth daily.       Marland Kitchen triamcinolone (KENALOG) 0.1 % cream Apply topically as needed. Apply to affected areas on legs        Results for orders placed during the hospital encounter of 09/07/12 (from the past 48 hour(s))  POCT I-STAT 4, (NA,K, GLUC, HGB,HCT)     Status: Abnormal   Collection Time   09/07/12  7:02 AM      Component Value Range Comment   Sodium 138  135 - 145 mEq/L    Potassium 3.2 (*) 3.5 - 5.1 mEq/L    Glucose, Bld 328 (*) 70 - 99 mg/dL    HCT 40.0  36.0 - 46.0 %    Hemoglobin 13.6  12.0 - 15.0 g/dL    No results found.  ROS  Blood pressure 108/69, pulse 70, temperature 97.1 F (36.2 C), temperature source Oral, resp. rate 18, height 5\' 4"  (1.626 m), weight 88.451 kg (195 lb),  SpO2 96.00%. Physical Exam  Constitutional: She is oriented to person, place, and time. She appears well-developed and well-nourished.  HENT:  Head: Normocephalic and atraumatic.  Right Ear: External ear normal.  Left Ear: External ear normal.  Eyes: Conjunctivae normal and EOM are normal. Pupils are equal, round, and reactive to light.  Neck: Normal range of motion. Neck supple.  Cardiovascular: Normal rate, regular rhythm, normal heart sounds and intact distal pulses.   Respiratory: Effort normal and breath sounds normal.  GI: Soft. Bowel sounds are normal.  Musculoskeletal:       Has had an interscalene block lt shoulder  Neurological: She is alert and oriented to person, place, and time. She has normal reflexes.  Skin: Skin is warm and dry.  Psychiatric: She has a normal mood and affect.     Assessment/Plan Torn rotator cuff lt shoulder Anterior acromionectomy and repair of torn rotator cuff  APLINGTON,JAMES P 09/07/2012, 7:31 AM

## 2012-09-07 NOTE — Anesthesia Procedure Notes (Addendum)
Anesthesia Regional Block:  Interscalene brachial plexus block  Pre-Anesthetic Checklist: ,, timeout performed, Correct Patient, Correct Site, Correct Laterality, Correct Procedure, Correct Position, site marked, Risks and benefits discussed,  Surgical consent,  Pre-op evaluation,  At surgeon's request and post-op pain management  Laterality: Left  Prep: chloraprep       Needles:  Injection technique: Single-shot  Needle Type: Stimiplex     Needle Length: 10cm 10 cm Needle Gauge: 20 and 20 G  Needle insertion depth: 5 cm   Additional Needles:  Procedures: ultrasound guided and nerve stimulator  Motor weakness within 5 minutes. Interscalene brachial plexus block  Nerve Stimulator or Paresthesia:  Response: Delt, bicepts, 0.5 mA,   Additional Responses:   Narrative:  Start time: 09/07/2012 7:15 AM End time: 09/07/2012 7:23 AM Injection made incrementally with aspirations every 5 mL.  Performed by: Personally  Anesthesiologist: [place]  Additional Notes: Patient tolerated the procedure well without complications.  Interscalene brachial plexus block Procedure Name: Intubation Date/Time: 09/07/2012 7:30 AM Performed by: Justice Rocher Pre-anesthesia Checklist: Patient identified, Emergency Drugs available, Suction available and Patient being monitored Patient Re-evaluated:Patient Re-evaluated prior to inductionOxygen Delivery Method: Circle System Utilized Preoxygenation: Pre-oxygenation with 100% oxygen Intubation Type: IV induction Ventilation: Mask ventilation without difficulty Laryngoscope Size: Mac and 3 Grade View: Grade II Tube type: Oral Tube size: 7.0 mm Number of attempts: 1 Airway Equipment and Method: stylet,  oral airway and LTA kit utilized Placement Confirmation: ETT inserted through vocal cords under direct vision and positive ETCO2 Secured at: 23 cm Tube secured with: Tape Dental Injury: Teeth and Oropharynx as per pre-operative assessment

## 2012-09-08 LAB — GLUCOSE, CAPILLARY: Glucose-Capillary: 135 mg/dL — ABNORMAL HIGH (ref 70–99)

## 2012-09-08 MED ORDER — METHOCARBAMOL 500 MG PO TABS: 500.0000 mg | ORAL_TABLET | Freq: Four times a day (QID) | ORAL | Status: DC | PRN

## 2012-09-08 MED ORDER — HYDROMORPHONE HCL PF 1 MG/ML IJ SOLN
1.0000 mg | Freq: Once | INTRAMUSCULAR | Status: AC
  Administered 2012-09-08: 1 mg via INTRAVENOUS

## 2012-09-08 MED ORDER — OXYCODONE-ACETAMINOPHEN 5-325 MG PO TABS: 1.0000 | ORAL_TABLET | ORAL | Status: DC | PRN

## 2012-09-08 NOTE — Progress Notes (Signed)
Patient's pain uncontrolled with medication ordered.  Benita Stabile PA called, new orders received, will continue to monitor.

## 2012-09-08 NOTE — Progress Notes (Signed)
Awake,alert. Med controlling pain.  N/V intact RUE.  Good family support at home. Disch. Home. RTC Fri.

## 2012-09-08 NOTE — Discharge Summary (Signed)
Physician Discharge Summary  Patient ID: LILJA JOST MRN: [id number] DOB/AGE: 71-Jan-1942 71 y.o.  Admit date: 09/07/2012 Discharge date: 09/08/2012  Admission Diagnoses:Rotator cuff tear,Lt. Shoulder.  Discharge Diagnoses:Same Active Problems:  * No active hospital problems. *    Discharged Condition:Stable improved. Hospital Course:  Good pain control. Voiding. Tol. OOB.  Consults:none  Significant Diagnostic Studies:none Treatments: Surgery  Discharge Exam: Blood pressure 112/74, pulse 60, temperature 98.1 F (36.7 C), temperature source Tympanic, resp. rate 16, height 5\' 4"  (1.626 m), weight 88.451 kg (195 lb), SpO2 99.00%. Neuro/vascular intact LUE.  Disposition: 01-Home or Self Care  Discharge Orders    Future Orders Please Complete By Expires   Diet - low sodium heart healthy      Discharge instructions      Call MD / Call 911      Comments:   If you experience chest pain or shortness of breath, CALL 911 and be transported to the hospital emergency room.  If you develope a fever above 101 F, pus (white drainage) or increased drainage or redness at the wound, or calf pain, call your surgeon's office.   Constipation Prevention      Comments:   Drink plenty of fluids.  Prune juice may be helpful.  You may use a stool softener, such as Colace (over the counter) 100 mg twice a day.  Use MiraLax (over the counter) for constipation as needed.   Increase activity slowly as tolerated      Discharge instructions      Comments:   Wear sling, may move elbow. Ice to shoulder as needed. Call if problems or questions. Call to be seen in our office this Friday.       Medication List     As of 09/08/2012  7:45 AM    TAKE these medications         acetaminophen 325 MG tablet   Commonly known as: TYLENOL   Take 650 mg by mouth every 6 (six) hours as needed.      atorvastatin 20 MG tablet   Commonly known as: LIPITOR   TAKE 1 TABLET AT BEDTIME      BAYER CHILDRENS  ASPIRIN 81 MG chewable tablet   Generic drug: aspirin   Chew 81 mg by mouth daily.      CALTRATE 600+D 600-400 MG-UNIT per tablet   Generic drug: Calcium Carbonate-Vitamin D   Take 1 tablet by mouth 2 (two) times daily.      citalopram 20 MG tablet   Commonly known as: CELEXA   Take 20 mg by mouth daily.      cloNIDine 0.1 MG tablet   Commonly known as: CATAPRES      exenatide 10 MCG/0.04ML Soln   Commonly known as: BYETTA   Inject 0.04 mLs (10 mcg total) into the skin 2 (two) times daily.      HYDROcodone-acetaminophen 5-325 MG per tablet   Commonly known as: NORCO/VICODIN   Take 1 tablet by mouth every 6 (six) hours as needed.      insulin aspart 100 UNIT/ML injection   Commonly known as: novoLOG   as directed. Inject 45 units prior to breakfast, 40 units prior to lunch and 40 units prior to evening meal.      insulin glargine 100 UNIT/ML injection   Commonly known as: LANTUS   Inject 60 Units into the skin 2 (two) times daily. Disp QS for 1 month      levothyroxine 50 MCG tablet  Commonly known as: SYNTHROID, LEVOTHROID      loratadine 10 MG tablet   Commonly known as: CLARITIN   Take 10 mg by mouth daily.      losartan-hydrochlorothiazide 100-25 MG per tablet   Commonly known as: HYZAAR   TAKE 1 TABLET DAILY      methocarbamol 500 MG tablet   Commonly known as: ROBAXIN   Take 1 tablet (500 mg total) by mouth every 6 (six) hours as needed.      oxyCODONE-acetaminophen 5-325 MG per tablet   Commonly known as: PERCOCET/ROXICET   Take 1-2 tablets by mouth every 4 (four) hours as needed (Q4-6 hours PRN).      triamcinolone cream 0.1 %   Commonly known as: KENALOG   Apply topically as needed. Apply to affected areas on legs      ASK your doctor about these medications         Fish Oil 1000 MG Caps   Take 1,000 mg by mouth 2 (two) times daily. OTC fish oil      naproxen sodium 220 MG tablet   Commonly known as: ANAPROX   Take 220 mg by mouth as needed.           Signed: Higinio Plan 09/08/2012, 7:45 AM

## 2012-09-09 ENCOUNTER — Encounter (HOSPITAL_BASED_OUTPATIENT_CLINIC_OR_DEPARTMENT_OTHER): Payer: Self-pay | Admitting: Orthopedic Surgery

## 2012-09-13 ENCOUNTER — Encounter (HOSPITAL_COMMUNITY): Payer: Self-pay

## 2012-09-27 ENCOUNTER — Encounter: Payer: Self-pay | Admitting: Home Health Services

## 2012-09-29 ENCOUNTER — Encounter: Payer: Self-pay | Admitting: Home Health Services

## 2012-11-25 ENCOUNTER — Ambulatory Visit: Payer: Medicare Other | Attending: Orthopedic Surgery | Admitting: Physical Therapy

## 2012-11-25 DIAGNOSIS — M25619 Stiffness of unspecified shoulder, not elsewhere classified: Secondary | ICD-10-CM | POA: Insufficient documentation

## 2012-11-25 DIAGNOSIS — IMO0001 Reserved for inherently not codable concepts without codable children: Secondary | ICD-10-CM | POA: Insufficient documentation

## 2012-11-25 DIAGNOSIS — M25519 Pain in unspecified shoulder: Secondary | ICD-10-CM | POA: Insufficient documentation

## 2012-11-30 ENCOUNTER — Ambulatory Visit: Payer: Medicare Other | Admitting: Physical Therapy

## 2012-11-30 ENCOUNTER — Ambulatory Visit
Admission: RE | Admit: 2012-11-30 | Discharge: 2012-11-30 | Disposition: A | Discharge status: Home / Self Care | Payer: Medicare Other | Source: Ambulatory Visit | Attending: Orthopedic Surgery | Admitting: Orthopedic Surgery

## 2012-11-30 ENCOUNTER — Other Ambulatory Visit: Payer: Self-pay | Admitting: Orthopedic Surgery

## 2012-11-30 DIAGNOSIS — M25512 Pain in left shoulder: Secondary | ICD-10-CM

## 2012-11-30 MED ORDER — IOHEXOL 300 MG/ML  SOLN
15.0000 mL | Freq: Once | INTRAMUSCULAR | Status: AC | PRN
  Administered 2012-11-30: 15 mL via INTRA_ARTICULAR

## 2012-12-01 ENCOUNTER — Ambulatory Visit: Payer: Medicare Other | Admitting: Physical Therapy

## 2012-12-06 ENCOUNTER — Ambulatory Visit: Payer: Medicare Other | Admitting: Physical Therapy

## 2012-12-09 ENCOUNTER — Ambulatory Visit: Payer: Medicare Other | Admitting: Physical Therapy

## 2012-12-21 ENCOUNTER — Other Ambulatory Visit: Payer: Self-pay | Admitting: Orthopedic Surgery

## 2012-12-24 ENCOUNTER — Encounter (HOSPITAL_BASED_OUTPATIENT_CLINIC_OR_DEPARTMENT_OTHER): Payer: Self-pay | Admitting: *Deleted

## 2012-12-24 NOTE — Progress Notes (Signed)
NPO AFTER MN. ARRIVES AT 0715. NEEDS ISTAT. CURRENT EKG IN EPIC AND CHART. WILL TAKE CLONIDINE AND SYNTHROID AM OF SURG W/ SIP OF WATER.

## 2012-12-28 ENCOUNTER — Observation Stay (HOSPITAL_BASED_OUTPATIENT_CLINIC_OR_DEPARTMENT_OTHER)
Admission: RE | Admit: 2012-12-28 | Discharge: 2012-12-29 | Disposition: A | Discharge status: Home / Self Care | Payer: Medicare Other | Source: Ambulatory Visit | Attending: Orthopedic Surgery | Admitting: Orthopedic Surgery

## 2012-12-28 ENCOUNTER — Encounter (HOSPITAL_BASED_OUTPATIENT_CLINIC_OR_DEPARTMENT_OTHER): Payer: Self-pay | Admitting: *Deleted

## 2012-12-28 ENCOUNTER — Encounter (HOSPITAL_COMMUNITY)
Admission: RE | Disposition: A | Discharge status: Home / Self Care | Payer: Self-pay | Source: Ambulatory Visit | Attending: Orthopedic Surgery

## 2012-12-28 ENCOUNTER — Ambulatory Visit (HOSPITAL_BASED_OUTPATIENT_CLINIC_OR_DEPARTMENT_OTHER): Payer: Medicare Other | Admitting: Anesthesiology

## 2012-12-28 ENCOUNTER — Encounter (HOSPITAL_BASED_OUTPATIENT_CLINIC_OR_DEPARTMENT_OTHER): Payer: Self-pay | Admitting: Anesthesiology

## 2012-12-28 DIAGNOSIS — T8140XA Infection following a procedure, unspecified, initial encounter: Secondary | ICD-10-CM

## 2012-12-28 DIAGNOSIS — S43429A Sprain of unspecified rotator cuff capsule, initial encounter: Secondary | ICD-10-CM

## 2012-12-28 DIAGNOSIS — M7120 Synovial cyst of popliteal space [Baker], unspecified knee: Secondary | ICD-10-CM

## 2012-12-28 DIAGNOSIS — E119 Type 2 diabetes mellitus without complications: Secondary | ICD-10-CM | POA: Insufficient documentation

## 2012-12-28 DIAGNOSIS — L84 Corns and callosities: Secondary | ICD-10-CM

## 2012-12-28 DIAGNOSIS — R21 Rash and other nonspecific skin eruption: Secondary | ICD-10-CM

## 2012-12-28 DIAGNOSIS — F329 Major depressive disorder, single episode, unspecified: Secondary | ICD-10-CM

## 2012-12-28 DIAGNOSIS — E1140 Type 2 diabetes mellitus with diabetic neuropathy, unspecified: Secondary | ICD-10-CM

## 2012-12-28 DIAGNOSIS — E039 Hypothyroidism, unspecified: Secondary | ICD-10-CM | POA: Diagnosis present

## 2012-12-28 DIAGNOSIS — E876 Hypokalemia: Secondary | ICD-10-CM | POA: Diagnosis present

## 2012-12-28 DIAGNOSIS — IMO0002 Reserved for concepts with insufficient information to code with codable children: Secondary | ICD-10-CM | POA: Diagnosis present

## 2012-12-28 DIAGNOSIS — E7800 Pure hypercholesterolemia, unspecified: Secondary | ICD-10-CM

## 2012-12-28 DIAGNOSIS — E669 Obesity, unspecified: Secondary | ICD-10-CM

## 2012-12-28 DIAGNOSIS — J309 Allergic rhinitis, unspecified: Secondary | ICD-10-CM

## 2012-12-28 DIAGNOSIS — E739 Lactose intolerance, unspecified: Secondary | ICD-10-CM

## 2012-12-28 DIAGNOSIS — E785 Hyperlipidemia, unspecified: Secondary | ICD-10-CM | POA: Insufficient documentation

## 2012-12-28 DIAGNOSIS — I10 Essential (primary) hypertension: Secondary | ICD-10-CM | POA: Diagnosis present

## 2012-12-28 DIAGNOSIS — M75102 Unspecified rotator cuff tear or rupture of left shoulder, not specified as traumatic: Secondary | ICD-10-CM | POA: Diagnosis present

## 2012-12-28 DIAGNOSIS — I872 Venous insufficiency (chronic) (peripheral): Secondary | ICD-10-CM

## 2012-12-28 DIAGNOSIS — M129 Arthropathy, unspecified: Secondary | ICD-10-CM

## 2012-12-28 DIAGNOSIS — Z79899 Other long term (current) drug therapy: Secondary | ICD-10-CM | POA: Insufficient documentation

## 2012-12-28 DIAGNOSIS — M25519 Pain in unspecified shoulder: Secondary | ICD-10-CM

## 2012-12-28 DIAGNOSIS — E1165 Type 2 diabetes mellitus with hyperglycemia: Secondary | ICD-10-CM

## 2012-12-28 DIAGNOSIS — Z794 Long term (current) use of insulin: Secondary | ICD-10-CM | POA: Insufficient documentation

## 2012-12-28 DIAGNOSIS — M67919 Unspecified disorder of synovium and tendon, unspecified shoulder: Principal | ICD-10-CM | POA: Insufficient documentation

## 2012-12-28 DIAGNOSIS — M5430 Sciatica, unspecified side: Secondary | ICD-10-CM

## 2012-12-28 DIAGNOSIS — M719 Bursopathy, unspecified: Principal | ICD-10-CM | POA: Insufficient documentation

## 2012-12-28 HISTORY — PX: SHOULDER OPEN ROTATOR CUFF REPAIR: SHX2407

## 2012-12-28 HISTORY — DX: Unspecified rotator cuff tear or rupture of left shoulder, not specified as traumatic: M75.102

## 2012-12-28 LAB — POCT I-STAT, CHEM 8
Calcium, Ion: 1.27 mmol/L (ref 1.13–1.30)
HCT: 37 % (ref 36.0–46.0)
TCO2: 25 mmol/L (ref 0–100)

## 2012-12-28 LAB — GLUCOSE, CAPILLARY
Glucose-Capillary: 351 mg/dL — ABNORMAL HIGH (ref 70–99)
Glucose-Capillary: 280 mg/dL — ABNORMAL HIGH (ref 70–99)
Glucose-Capillary: 249 mg/dL — ABNORMAL HIGH (ref 70–99)
Glucose-Capillary: 227 mg/dL — ABNORMAL HIGH (ref 70–99)

## 2012-12-28 SURGERY — REPAIR, ROTATOR CUFF, OPEN
Anesthesia: General | Site: Shoulder | Laterality: Left

## 2012-12-28 MED ORDER — LEVOTHYROXINE SODIUM 50 MCG PO TABS
50.0000 ug | ORAL_TABLET | Freq: Every morning | ORAL | Status: DC
  Filled 2012-12-28: qty 1

## 2012-12-28 MED ORDER — METOCLOPRAMIDE HCL 5 MG PO TABS
5.0000 mg | ORAL_TABLET | Freq: Three times a day (TID) | ORAL | Status: DC | PRN
  Filled 2012-12-28: qty 2

## 2012-12-28 MED ORDER — LEVOTHYROXINE SODIUM 50 MCG PO TABS
50.0000 ug | ORAL_TABLET | Freq: Every day | ORAL | Status: DC
  Administered 2012-12-29: 50 ug via ORAL
  Filled 2012-12-28 (×2): qty 1

## 2012-12-28 MED ORDER — ROPIVACAINE HCL 5 MG/ML IJ SOLN
INTRAMUSCULAR | Status: DC | PRN
  Administered 2012-12-28: 30 mL

## 2012-12-28 MED ORDER — ACETAMINOPHEN 650 MG RE SUPP
650.0000 mg | Freq: Four times a day (QID) | RECTAL | Status: DC | PRN
  Filled 2012-12-28: qty 1

## 2012-12-28 MED ORDER — MIDAZOLAM HCL 2 MG/2ML IJ SOLN
2.0000 mg | Freq: Once | INTRAMUSCULAR | Status: AC
  Administered 2012-12-28: 2 mg via INTRAVENOUS
  Filled 2012-12-28: qty 2

## 2012-12-28 MED ORDER — MENTHOL 3 MG MT LOZG
1.0000 | LOZENGE | OROMUCOSAL | Status: DC | PRN
  Filled 2012-12-28: qty 9

## 2012-12-28 MED ORDER — PHENOL 1.4 % MT LIQD
1.0000 | OROMUCOSAL | Status: DC | PRN
  Filled 2012-12-28: qty 177

## 2012-12-28 MED ORDER — SUCCINYLCHOLINE CHLORIDE 20 MG/ML IJ SOLN
INTRAMUSCULAR | Status: DC | PRN
  Administered 2012-12-28: 140 mg via INTRAVENOUS

## 2012-12-28 MED ORDER — CLONIDINE HCL 0.1 MG PO TABS
0.1000 mg | ORAL_TABLET | Freq: Two times a day (BID) | ORAL | Status: DC
  Administered 2012-12-28 – 2012-12-29 (×2): 0.1 mg via ORAL
  Filled 2012-12-28 (×4): qty 1

## 2012-12-28 MED ORDER — LOSARTAN POTASSIUM 50 MG PO TABS
100.0000 mg | ORAL_TABLET | Freq: Every day | ORAL | Status: DC
  Administered 2012-12-28 – 2012-12-29 (×2): 100 mg via ORAL
  Filled 2012-12-28 (×2): qty 2

## 2012-12-28 MED ORDER — METHOCARBAMOL 100 MG/ML IJ SOLN
500.0000 mg | Freq: Four times a day (QID) | INTRAMUSCULAR | Status: DC | PRN
  Filled 2012-12-28: qty 5

## 2012-12-28 MED ORDER — OXYCODONE-ACETAMINOPHEN 5-325 MG PO TABS
1.0000 | ORAL_TABLET | ORAL | Status: DC | PRN
  Administered 2012-12-28: 1 via ORAL
  Administered 2012-12-28 – 2012-12-29 (×3): 2 via ORAL
  Filled 2012-12-28 (×3): qty 2
  Filled 2012-12-28: qty 1
  Filled 2012-12-28: qty 2

## 2012-12-28 MED ORDER — ACETAMINOPHEN 325 MG PO TABS
650.0000 mg | ORAL_TABLET | Freq: Four times a day (QID) | ORAL | Status: DC | PRN
  Filled 2012-12-28: qty 2

## 2012-12-28 MED ORDER — LACTATED RINGERS IV SOLN
INTRAVENOUS | Status: DC
  Administered 2012-12-28 (×3): via INTRAVENOUS
  Filled 2012-12-28: qty 1000

## 2012-12-28 MED ORDER — EXENATIDE 10 MCG/0.04ML ~~LOC~~ SOPN
10.0000 ug | PEN_INJECTOR | Freq: Two times a day (BID) | SUBCUTANEOUS | Status: DC
  Filled 2012-12-28: qty 2.4

## 2012-12-28 MED ORDER — INSULIN GLARGINE 100 UNIT/ML ~~LOC~~ SOLN
50.0000 [IU] | Freq: Two times a day (BID) | SUBCUTANEOUS | Status: DC
  Administered 2012-12-28: 50 [IU] via SUBCUTANEOUS

## 2012-12-28 MED ORDER — FENTANYL CITRATE 0.05 MG/ML IJ SOLN
INTRAMUSCULAR | Status: DC | PRN
  Administered 2012-12-28: 50 ug via INTRAVENOUS

## 2012-12-28 MED ORDER — HYDROCODONE-ACETAMINOPHEN 5-325 MG PO TABS
1.0000 | ORAL_TABLET | ORAL | Status: DC | PRN
  Filled 2012-12-28: qty 2

## 2012-12-28 MED ORDER — FENTANYL CITRATE 0.05 MG/ML IJ SOLN
25.0000 ug | INTRAMUSCULAR | Status: DC | PRN
  Filled 2012-12-28: qty 1

## 2012-12-28 MED ORDER — HYDROCODONE-ACETAMINOPHEN 5-325 MG PO TABS
1.0000 | ORAL_TABLET | Freq: Four times a day (QID) | ORAL | Status: DC | PRN
  Filled 2012-12-28: qty 1

## 2012-12-28 MED ORDER — LOSARTAN POTASSIUM-HCTZ 100-25 MG PO TABS: 1.0000 | ORAL_TABLET | Freq: Every day | ORAL | Status: DC

## 2012-12-28 MED ORDER — ONDANSETRON HCL 4 MG/2ML IJ SOLN
4.0000 mg | Freq: Four times a day (QID) | INTRAMUSCULAR | Status: DC | PRN
  Filled 2012-12-28: qty 2

## 2012-12-28 MED ORDER — METOCLOPRAMIDE HCL 5 MG/ML IJ SOLN
5.0000 mg | Freq: Three times a day (TID) | INTRAMUSCULAR | Status: DC | PRN
  Filled 2012-12-28: qty 2

## 2012-12-28 MED ORDER — MIDAZOLAM HCL 2 MG/2ML IJ SOLN
2.0000 mg | Freq: Once | INTRAMUSCULAR | Status: DC
  Filled 2012-12-28: qty 2

## 2012-12-28 MED ORDER — LACTATED RINGERS IV SOLN
INTRAVENOUS | Status: DC
  Filled 2012-12-28: qty 1000

## 2012-12-28 MED ORDER — ONDANSETRON HCL 4 MG PO TABS
4.0000 mg | ORAL_TABLET | Freq: Four times a day (QID) | ORAL | Status: DC | PRN
  Filled 2012-12-28: qty 1

## 2012-12-28 MED ORDER — SODIUM CHLORIDE 0.9 % IV SOLN
INTRAVENOUS | Status: DC
  Administered 2012-12-29: 08:00:00 via INTRAVENOUS
  Filled 2012-12-28: qty 1000

## 2012-12-28 MED ORDER — INSULIN ASPART 100 UNIT/ML ~~LOC~~ SOLN
0.0000 [IU] | Freq: Three times a day (TID) | SUBCUTANEOUS | Status: DC
  Administered 2012-12-28: 5 [IU] via SUBCUTANEOUS
  Administered 2012-12-29: 8 [IU] via SUBCUTANEOUS
  Administered 2012-12-29: 11 [IU] via SUBCUTANEOUS
  Filled 2012-12-28: qty 3

## 2012-12-28 MED ORDER — CITALOPRAM HYDROBROMIDE 20 MG PO TABS
20.0000 mg | ORAL_TABLET | Freq: Every day | ORAL | Status: DC
  Administered 2012-12-29: 20 mg via ORAL
  Filled 2012-12-28 (×2): qty 1

## 2012-12-28 MED ORDER — LIDOCAINE HCL (CARDIAC) 20 MG/ML IV SOLN
INTRAVENOUS | Status: DC | PRN
  Administered 2012-12-28: 50 mg via INTRAVENOUS

## 2012-12-28 MED ORDER — METHOCARBAMOL 500 MG PO TABS
500.0000 mg | ORAL_TABLET | Freq: Four times a day (QID) | ORAL | Status: DC | PRN
  Administered 2012-12-28 (×2): 500 mg via ORAL
  Filled 2012-12-28 (×3): qty 1

## 2012-12-28 MED ORDER — CEFAZOLIN SODIUM-DEXTROSE 2-3 GM-% IV SOLR
2.0000 g | Freq: Four times a day (QID) | INTRAVENOUS | Status: AC
  Administered 2012-12-28 (×2): 2 g via INTRAVENOUS
  Filled 2012-12-28 (×3): qty 50

## 2012-12-28 MED ORDER — ONDANSETRON HCL 4 MG/2ML IJ SOLN
INTRAMUSCULAR | Status: DC | PRN
  Administered 2012-12-28: 4 mg via INTRAVENOUS

## 2012-12-28 MED ORDER — HYDROCHLOROTHIAZIDE 25 MG PO TABS
25.0000 mg | ORAL_TABLET | Freq: Every day | ORAL | Status: DC
  Administered 2012-12-28 – 2012-12-29 (×2): 25 mg via ORAL
  Filled 2012-12-28 (×2): qty 1

## 2012-12-28 MED ORDER — CEFAZOLIN SODIUM-DEXTROSE 2-3 GM-% IV SOLR
2.0000 g | INTRAVENOUS | Status: AC
  Administered 2012-12-28: 2 g via INTRAVENOUS
  Filled 2012-12-28: qty 50

## 2012-12-28 MED ORDER — INSULIN ASPART 100 UNIT/ML ~~LOC~~ SOLN
0.0000 [IU] | SUBCUTANEOUS | Status: DC
  Filled 2012-12-28: qty 3

## 2012-12-28 MED ORDER — PROPOFOL 10 MG/ML IV BOLUS
INTRAVENOUS | Status: DC | PRN
  Administered 2012-12-28: 120 mg via INTRAVENOUS

## 2012-12-28 MED ORDER — METHOCARBAMOL 500 MG PO TABS
500.0000 mg | ORAL_TABLET | Freq: Four times a day (QID) | ORAL | Status: DC | PRN
Start: 2012-12-28 — End: 2012-12-28
  Filled 2012-12-28: qty 1

## 2012-12-28 MED ORDER — EPHEDRINE SULFATE 50 MG/ML IJ SOLN
INTRAMUSCULAR | Status: DC | PRN
  Administered 2012-12-28 (×8): 10 mg via INTRAVENOUS

## 2012-12-28 MED ORDER — OXYCODONE-ACETAMINOPHEN 5-325 MG PO TABS
1.0000 | ORAL_TABLET | ORAL | Status: DC | PRN
  Filled 2012-12-28: qty 2

## 2012-12-28 MED ORDER — POVIDONE-IODINE 7.5 % EX SOLN
Freq: Once | CUTANEOUS | Status: DC
  Filled 2012-12-28: qty 118

## 2012-12-28 MED ORDER — LACTATED RINGERS IV SOLN
INTRAVENOUS | Status: DC
  Administered 2012-12-28: 14:00:00 via INTRAVENOUS
  Filled 2012-12-28: qty 1000

## 2012-12-28 MED ORDER — HYDROMORPHONE HCL PF 2 MG/ML IJ SOLN
2.0000 mg | Freq: Once | INTRAMUSCULAR | Status: AC
  Administered 2012-12-28: 2 mg via INTRAVENOUS
  Filled 2012-12-28: qty 1

## 2012-12-28 MED ORDER — POTASSIUM CHLORIDE CRYS ER 20 MEQ PO TBCR
60.0000 meq | EXTENDED_RELEASE_TABLET | Freq: Once | ORAL | Status: AC
  Administered 2012-12-28: 60 meq via ORAL
  Filled 2012-12-28: qty 3

## 2012-12-28 MED ORDER — FENTANYL CITRATE 0.05 MG/ML IJ SOLN
50.0000 ug | Freq: Once | INTRAMUSCULAR | Status: AC
  Administered 2012-12-28: 50 ug via INTRAVENOUS
  Filled 2012-12-28: qty 1

## 2012-12-28 MED ORDER — NAPROXEN SODIUM 220 MG PO TABS: 220.0000 mg | ORAL_TABLET | ORAL | Status: DC | PRN

## 2012-12-28 SURGICAL SUPPLY — 58 items
ANCH SUT 2 5.5 BABSR ASCP (Orthopedic Implant) ×2 IMPLANT
ANCHOR PEEK ZIP 5.5 NDL NO2 (Orthopedic Implant) ×4 IMPLANT
APL SKNCLS STERI-STRIP NONHPOA (GAUZE/BANDAGES/DRESSINGS) ×1
BENZOIN TINCTURE PRP APPL 2/3 (GAUZE/BANDAGES/DRESSINGS) ×2 IMPLANT
BLADE FLAT COURSE (BLADE) ×2 IMPLANT
BLADE SURG 10 STRL SS (BLADE) ×2 IMPLANT
BLADE SURG 15 STRL LF DISP TIS (BLADE) ×1 IMPLANT
BLADE SURG 15 STRL SS (BLADE) ×2
CANISTER SUCTION 1200CC (MISCELLANEOUS) ×2 IMPLANT
CLEANER CAUTERY TIP 5X5 PAD (MISCELLANEOUS) ×1 IMPLANT
CLOTH BEACON ORANGE TIMEOUT ST (SAFETY) ×2 IMPLANT
DRAPE INCISE IOBAN 66X45 STRL (DRAPES) ×2 IMPLANT
DRAPE LG THREE QUARTER DISP (DRAPES) ×4 IMPLANT
DRAPE SHOULDER BEACH CHAIR (DRAPES) ×6 IMPLANT
DRAPE U-SHAPE 47X51 STRL (DRAPES) ×2 IMPLANT
DRSG PAD ABDOMINAL 8X10 ST (GAUZE/BANDAGES/DRESSINGS) ×2 IMPLANT
DURAPREP 26ML APPLICATOR (WOUND CARE) ×2 IMPLANT
ELECT REM PT RETURN 9FT ADLT (ELECTROSURGICAL) ×2
ELECTRODE REM PT RTRN 9FT ADLT (ELECTROSURGICAL) ×1 IMPLANT
GAUZE SPONGE 4X4 12PLY STRL LF (GAUZE/BANDAGES/DRESSINGS) ×2 IMPLANT
GLOVE BIOGEL PI IND STRL 8 (GLOVE) ×1 IMPLANT
GLOVE BIOGEL PI INDICATOR 8 (GLOVE) ×1
GLOVE ECLIPSE 8.0 STRL XLNG CF (GLOVE) ×4 IMPLANT
GLOVE INDICATOR 8.0 STRL GRN (GLOVE) ×2 IMPLANT
GOWN PREVENTION PLUS LG XLONG (DISPOSABLE) ×2 IMPLANT
GOWN STRL REIN XL XLG (GOWN DISPOSABLE) ×2 IMPLANT
GOWN SURGICAL LARGE (GOWNS) ×2 IMPLANT
NEEDLE 1/2 CIR CATGUT .05X1.09 (NEEDLE) IMPLANT
NS IRRIG 500ML POUR BTL (IV SOLUTION) ×2 IMPLANT
PACK ARTHROSCOPY DSU (CUSTOM PROCEDURE TRAY) ×2 IMPLANT
PACK BASIN DAY SURGERY FS (CUSTOM PROCEDURE TRAY) ×2 IMPLANT
PAD CLEANER CAUTERY TIP 5X5 (MISCELLANEOUS) ×1
PENCIL BUTTON HOLSTER BLD 10FT (ELECTRODE) ×2 IMPLANT
SLING ARM IMMOBILIZER LRG (SOFTGOODS) ×2 IMPLANT
SLING ARM IMMOBILIZER MED (SOFTGOODS) IMPLANT
SLING ULTRA II L (ORTHOPEDIC SUPPLIES) ×2 IMPLANT
SPONGE GAUZE 4X4 12PLY (GAUZE/BANDAGES/DRESSINGS) ×2 IMPLANT
SPONGE LAP 4X18 X RAY DECT (DISPOSABLE) ×6 IMPLANT
SPONGE SURGIFOAM ABS GEL 100 (HEMOSTASIS) ×2 IMPLANT
STAPLER VISISTAT (STAPLE) ×2 IMPLANT
STRIP CLOSURE SKIN 1/2X4 (GAUZE/BANDAGES/DRESSINGS) ×2 IMPLANT
SUCTION FRAZIER TIP 10 FR DISP (SUCTIONS) ×2 IMPLANT
SUT BONE WAX W31G (SUTURE) ×2 IMPLANT
SUT ETHIBOND GREEN BRAID 0S 4 (SUTURE) IMPLANT
SUT ETHILON 4 0 PS 2 18 (SUTURE) ×2 IMPLANT
SUT VIC AB 0 CT1 36 (SUTURE) ×2 IMPLANT
SUT VIC AB 1 CT1 36 (SUTURE) ×4 IMPLANT
SUT VIC AB 2-0 CT1 27 (SUTURE) ×2
SUT VIC AB 2-0 CT1 TAPERPNT 27 (SUTURE) ×1 IMPLANT
SUT VIC AB 3-0 CT1 27 (SUTURE)
SUT VIC AB 3-0 CT1 TAPERPNT 27 (SUTURE) IMPLANT
SUT VIC AB 3-0 SH 27 (SUTURE)
SUT VIC AB 3-0 SH 27X BRD (SUTURE) IMPLANT
SUT VICRYL 4-0 PS2 18IN ABS (SUTURE) IMPLANT
SYR BULB IRRIGATION 50ML (SYRINGE) ×2 IMPLANT
TAPE HYPAFIX 6X30 (GAUZE/BANDAGES/DRESSINGS) IMPLANT
TOWEL OR 17X24 6PK STRL BLUE (TOWEL DISPOSABLE) ×2 IMPLANT
WATER STERILE IRR 500ML POUR (IV SOLUTION) ×2 IMPLANT

## 2012-12-28 NOTE — Op Note (Signed)
[name], Sara Graves               ACCOUNT NO.:  [id number]  MEDICAL RECORD NO.:  [id number]  LOCATION:  [id number]                         FACILITY:  [facility]  PHYSICIAN:  Tarri Glenn, M.D.  DATE OF BIRTH:  March 14, 1941  DATE OF PROCEDURE:  12/28/2012 DATE OF DISCHARGE:                              OPERATIVE REPORT   PREOPERATIVE DIAGNOSIS:  Recurrent rotator cuff tear, left shoulder.  POSTOPERATIVE DIAGNOSIS:  Recurrent rotator cuff tear, left shoulder.  OPERATION:  Anterior acromionectomy with repair of recurrent rotator cuff tear, left shoulder.  SURGEON:  Tarri Glenn, M.D.  ASSISTANTElodia Florence. Delorse Lek, PA-C  ANESTHESIA:  General preceded by interscalene block.  PATHOLOGY AND JUSTIFICATION FOR PROCEDURE:  She has had a prior repair, but because of pain, had an arthrogram demonstrating a large recurrent tear.  This was confirmed at surgery with a full-thickness full rotator cuff width, retracted rotator cuff tear.  PROCEDURE:  Prophylactic antibiotics, satisfactory interscalene block, satisfactory general anesthesia, beach-chair position on sliding frame. Left shoulder girdle was prepped with DuraPrep and draped in sterile field.  Time-out performed.  The old incision was marked out and [place] employed.  We went through the old surgical incision, which I ended up having to enlarged superiorly.  I opened the residual anterior acromion fascia with cutting cautery and reflected it back anteriorly and posteriorly.  For exposure, I had to remove good bit of additional anterior acromion.  She had, as noted, a rotator cuff tear, which was full-thickness and retracted.  I also went through the entire anterior- to-posterior width of the rotator cuff.  Nonabsorbable sutures were removed.  I freed up the rotator cuff, both with sharp and digital dissection, and roughened up the greater tuberosity once again from front to back throughout its extent.  After assessing the severity  of the tear, I then used two 5.5 4-stranded Striker anchors woven through the width of the rotator cuff.  I then put lateral traction and placed a Mayo stand underneath the arm and leaving the arm at about 45 degrees of abduction to tie down these strands and then I made additional lateral tie downs with the lateral bursal and fascial tissue, which seemed to give the nice stable repair, but with her arm in about 30 degrees of abduction.  I then irrigated the wound with sterile saline and closed the fascia over the anterior acromion and the small slit in the deltoid muscle with interrupted #1 Vicryl.  She used #1 Vicryl deep and 2-0 Vicryl superficially in subcutaneous tissue, staples in the skin.  While she was still asleep and in beach-chair position, I held the arm in about 30 degrees of abduction as we applied a DonJoy lateral support shoulder immobilizer.  At the time of this dictation, she was in satisfactory condition and was on the way to the recovery room with no known complications.  Mr. Benita Gutter assistance was necessary because of the complexity of the case and I will in holding the arm with the instrumentation.          ______________________________ Tarri Glenn, M.D.     JA/MEDQ  D:  12/28/2012  T:  12/28/2012  Job:  [id number]

## 2012-12-28 NOTE — Brief Op Note (Signed)
12/28/2012  11:12 AM  PATIENT:  Sara Graves  72 y.o. female  PRE-OPERATIVE DIAGNOSIS:  recurrent left shoulder rotator cuff tear  POST-OPERATIVE DIAGNOSIS:  recurrent left shoulder rotator cuff tear  PROCEDURE:  Procedure(s) with comments: ROTATOR CUFF REPAIR SHOULDER OPEN (Left) - OPEN SHOULDER ROTATOR CUFF REPAIR ON LEFT  WITH ANTERIOR ACROMINECTOMY ANESTHESIA: GENERAL/SCALENE NERVE BLOCK  SURGEON:  Surgeon(s) and Role:    * Magnus Sinning, MD - Primary  PHYSICIAN ASSISTANT:   ASSISTANTS: Mr Delorse Lek [facility]  ANESTHESIA:   regional and general  EBL:  Total I/O In: 1000 [I.V.:1000] Out: -   BLOOD ADMINISTERED:none  DRAINS: none   LOCAL MEDICATIONS USED:  NONE  SPECIMEN:  No Specimen  DISPOSITION OF SPECIMEN:  N/A  COUNTS:  YES  TOURNIQUET:  * No tourniquets in log *  DICTATION: .Other Dictation: Dictation Number [id number]  PLAN OF CARE: Admit for overnight observation  PATIENT DISPOSITION:  PACU - hemodynamically stable.   Delay start of Pharmacological VTE agent (>24hrs) due to surgical blood loss or risk of bleeding: yes

## 2012-12-28 NOTE — Anesthesia Postprocedure Evaluation (Signed)
  Anesthesia Post-op Note  Patient: Sara Graves  Procedure(s) Performed: Procedure(s) (LRB): ROTATOR CUFF REPAIR SHOULDER OPEN (Left)  Patient Location: PACU  Anesthesia Type: GA combined with regional for post-op pain  Level of Consciousness: awake and alert   Airway and Oxygen Therapy: Patient Spontanous Breathing  Post-op Pain: mild  Post-op Assessment: Post-op Vital signs reviewed, Patient's Cardiovascular Status Stable, Respiratory Function Stable, Patent Airway and No signs of Nausea or vomiting  Last Vitals:  Filed Vitals:   12/28/12 1115  BP: 156/81  Pulse: 86  Temp: 36.5 C  Resp: 16    Post-op Vital Signs: stable   Complications: No apparent anesthesia complications

## 2012-12-28 NOTE — H&P (Signed)
Sara Graves is an 72 y.o. female.   Chief Complaint: painful lt shoulder HPI: she has had a prior rotator cuff repair;now a recurrence based on pos arthrogram  Past Medical History  Diagnosis Date  . H. pylori infection 2008 and 1998    treated  . Multinodular goiter   . Hx of colonoscopy with polypectomy   . Depression   . Hyperlipidemia   . Hypertension   . Arthritis   . Urge urinary incontinence   . Diabetes mellitus, type II, insulin dependent   . Rotator cuff tear, left recurrent     Past Surgical History  Procedure Laterality Date  . 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 Lipoma excision      RIGHT ELBOW  . Excision ganglion cyst left ring finger  01-17-2009  . Implantation permanent spinal cord stimulator  06-15-2008    JUNE 2013--  BATTERY CHANGE  . Foraminal decompression at l2 to the sacrum  01-05-2008    L2  -  S1  . Rotator cuff repair  03-28-2003    RIGHT SHOULDER  DEGENERATIVE AC JOINT AND RC TEAR  . Bladder neck suspension  1970'S  . Bilateral bunionectomies    . Hammertoe repair      BILATERAL  . Vaginal hysterectomy  1970'S    W/ BILATERAL SALPINGO-OOPHORECTOMY  . Appendectomy  AGE 70  . Tonsillectomy  AGE 25  . Shoulder open rotator cuff repair  09/07/2012    Procedure: ROTATOR CUFF REPAIR SHOULDER OPEN;  Surgeon: Magnus Sinning, MD;  Location: [place];  Service: Orthopedics;  Laterality: Left;  OPEN ANTERIOR ACROMIONECTOMY AND ROTATOR CUFF REPAIR ON LEFT     Family History  Problem Relation Age of Onset  . Cancer Mother     breast  . Muscular dystrophy Son   . Cancer Maternal Grandmother     breast  . Heart disease Maternal Grandfather    Social History:  reports that she has never smoked. She has never used smokeless tobacco. She reports that she does not drink alcohol or use illicit drugs.  Allergies:  Allergies  Allergen Reactions  . Morphine Other (See Comments)    SEVERE HYPOTENSION   .  Rosiglitazone Maleate Swelling      (ANTIDIABETIC MED.)  . Tramadol-Acetaminophen Rash    Medications Prior to Admission  Medication Sig Dispense Refill  . acetaminophen (TYLENOL) 325 MG tablet Take 650 mg by mouth every 6 (six) hours as needed.      Marland Kitchen aspirin (BAYER CHILDRENS ASPIRIN) 81 MG chewable tablet Chew 81 mg by mouth daily.       Marland Kitchen atorvastatin (LIPITOR) 20 MG tablet TAKE 1 TABLET AT BEDTIME  90 tablet  2  . Calcium Carbonate-Vitamin D (CALTRATE 600+D) 600-400 MG-UNIT per tablet Take 1 tablet by mouth 2 (two) times daily.       . citalopram (CELEXA) 20 MG tablet Take 20 mg by mouth daily.      . cloNIDine (CATAPRES) 0.1 MG tablet Take 0.1 mg by mouth 2 (two) times daily.       Marland Kitchen exenatide (BYETTA 10 MCG PEN) 10 MCG/0.04ML SOLN Inject 0.04 mLs (10 mcg total) into the skin 2 (two) times daily.  1.2 mL  11  . HYDROcodone-acetaminophen (NORCO/VICODIN) 5-325 MG per tablet Take 1 tablet by mouth every 6 (six) hours as needed.      . insulin aspart (NOVOLOG) 100 UNIT/ML injection as directed. Inject 45  units prior to breakfast, 40 units prior to lunch and 40 units prior to evening meal.      . insulin glargine (LANTUS) 100 UNIT/ML injection Inject 60 Units into the skin 2 (two) times daily. Disp QS for 1 month  10 mL  11  . levothyroxine (SYNTHROID, LEVOTHROID) 50 MCG tablet Take 50 mcg by mouth every morning.       Marland Kitchen losartan-hydrochlorothiazide (HYZAAR) 100-25 MG per tablet TAKE 1 TABLET DAILY  90 tablet  2  . methocarbamol (ROBAXIN) 500 MG tablet Take 1 tablet (500 mg total) by mouth every 6 (six) hours as needed.  30 tablet  3  . naproxen sodium (ANAPROX) 220 MG tablet Take 220 mg by mouth as needed.      . Omega-3 Fatty Acids (FISH OIL) 1000 MG CAPS Take 1,000 mg by mouth 2 (two) times daily. OTC fish oil      . oxyCODONE-acetaminophen (PERCOCET/ROXICET) 5-325 MG per tablet Take 1-2 tablets by mouth every 4 (four) hours as needed (Q4-6 hours PRN).  30 tablet  0  . loratadine (CLARITIN)  10 MG tablet Take 10 mg by mouth as needed.       . triamcinolone (KENALOG) 0.1 % cream Apply topically as needed. Apply to affected areas on legs        Results for orders placed during the hospital encounter of 12/28/12 (from the past 48 hour(s))  POCT I-STAT, CHEM 8     Status: Abnormal   Collection Time    12/28/12  8:12 AM      Result Value Range   Sodium 137  135 - 145 mEq/L   Potassium 3.2 (*) 3.5 - 5.1 mEq/L   Chloride 100  96 - 112 mEq/L   BUN 9  6 - 23 mg/dL   Creatinine, Ser 0.60  0.50 - 1.10 mg/dL   Glucose, Bld 403 (*) 70 - 99 mg/dL   Calcium, Ion 1.27  1.13 - 1.30 mmol/L   TCO2 25  0 - 100 mmol/L   Hemoglobin 12.6  12.0 - 15.0 g/dL   HCT 37.0  36.0 - 46.0 %   No results found.  ROS  Blood pressure 158/77, pulse 76, temperature 97 F (36.1 C), temperature source Oral, resp. rate 18, height 5\' 4"  (1.626 m), weight 78.472 kg (173 lb), SpO2 99.00%. Physical Exam  Constitutional: She is oriented to person, place, and time. She appears well-developed and well-nourished.  HENT:  Head: Normocephalic and atraumatic.  Right Ear: External ear normal.  Left Ear: External ear normal.  Nose: Nose normal.  Mouth/Throat: Oropharynx is clear and moist.  Eyes: Conjunctivae and EOM are normal. Pupils are equal, round, and reactive to light.  Neck: Normal range of motion. Neck supple.  Cardiovascular: Normal rate, regular rhythm, normal heart sounds and intact distal pulses.   Respiratory: Effort normal and breath sounds normal.  GI: Soft. Bowel sounds are normal.  Musculoskeletal:  He has had an inter scalene block lt shoulder  Neurological: She is alert and oriented to person, place, and time. She has normal reflexes.  Skin: Skin is warm and dry.  Psychiatric: She has a normal mood and affect. Her behavior is normal. Judgment and thought content normal.     Assessment/Plan Recurrent rotator cuff tear left shoulder Repair same  APLINGTON,JAMES P 12/28/2012, 9:02  AM

## 2012-12-28 NOTE — Consult Note (Signed)
[facility] Medical Consultation  Sara Graves [id number] DOB: 1941/11/10 DOA: 12/28/2012 PCP: Luis Abed, MD   Requesting physician: [place] Date of consultation: 12/28/12 Reason for consultation: Management of uncontrolled DM  Impression/Recommendations Active Problems: Diabetes mellitus out of control -will resume lantus at lower dose for now, follow and adjust to out pt dose and resume [place] as well if tolerating solids/carb mod diet started this pm  -continue SSI coverage  Hypokalemia -replace k, follow and recheck HYPOTHYROIDISM, UNSPECIFIED   -continue synthroid HYPERTENSION, BENIGN SYSTEMIC -continue outpt meds     [facility] will followup again tomorrow. Please contact [facility] if we can be of assistance in the meanwhile. Thank you for this consultation.      HPI:  Pt is 72yo female with h/o DM, HTN, hypothyroidism and rotator cuff tear admitted to the ortho service for overnight observation s/p L. Shoulder sugery. She was found to have BG in the 400s and medicine consulted for DM management. She denies CP, SOB, and no N/V. She states that she has missed some insulin doses lately because she got too busy taking care of her mother and disabled son.    Review of Systems:  The patient denies anorexia, fever, weight loss, vision loss, decreased hearing, hoarseness, chest pain, syncope, dyspnea on exertion, peripheral edema, balance deficits, hemoptysis, abdominal pain, melena, hematochezia, severe indigestion/heartburn, hematuria, incontinence, muscle weakness.  Past Medical History  Diagnosis Date  . H. pylori infection 2008 and 1998    treated  . Multinodular goiter   . Hx of colonoscopy with polypectomy   . Depression   . Hyperlipidemia   . Hypertension   . Arthritis   . Urge urinary incontinence   . Diabetes mellitus, type II, insulin dependent   . Rotator cuff tear, left recurrent    Past Surgical History  Procedure Laterality Date  . Total knee  arthroplasty  05-06-2000    OA RIGHT KNEE  . Lipoma excision      RIGHT ELBOW  . Excision ganglion cyst left ring finger  01-17-2009  . Implantation permanent spinal cord stimulator  06-15-2008    JUNE 2013--  BATTERY CHANGE  . Foraminal decompression at l2 to the sacrum  01-05-2008    L2  -  S1  . Rotator cuff repair  03-28-2003    RIGHT SHOULDER  DEGENERATIVE AC JOINT AND RC TEAR  . Bladder neck suspension  1970'S  . Bilateral bunionectomies    . Hammertoe repair      BILATERAL  . Vaginal hysterectomy  1970'S    W/ BILATERAL SALPINGO-OOPHORECTOMY  . Appendectomy  AGE 49  . Tonsillectomy  AGE 56  . Shoulder open rotator cuff repair  09/07/2012    Procedure: ROTATOR CUFF REPAIR SHOULDER OPEN;  Surgeon: Magnus Sinning, MD;  Location: [place];  Service: Orthopedics;  Laterality: Left;  OPEN ANTERIOR ACROMIONECTOMY AND ROTATOR CUFF REPAIR ON LEFT    Social History:  reports that she has never smoked. She has never used smokeless tobacco. She reports that she does not drink alcohol or use illicit drugs.  Allergies  Allergen Reactions  . Morphine Other (See Comments)    SEVERE HYPOTENSION   . Rosiglitazone Maleate Swelling      (ANTIDIABETIC MED.)  . Tramadol-Acetaminophen Rash   Family History  Problem Relation Age of Onset  . Cancer Mother     breast  . Muscular dystrophy Son   . Cancer Maternal Grandmother     breast  . Heart disease  Maternal Grandfather     Prior to Admission medications   Medication Sig Start Date End Date Taking? Authorizing Provider  acetaminophen (TYLENOL) 325 MG tablet Take 650 mg by mouth every 6 (six) hours as needed.   Yes Historical Provider, MD  aspirin (BAYER CHILDRENS ASPIRIN) 81 MG chewable tablet Chew 81 mg by mouth daily.    Yes Historical Provider, MD  atorvastatin (LIPITOR) 20 MG tablet TAKE 1 TABLET AT BEDTIME 12/24/11  Yes Clovis Cao, MD  Calcium Carbonate-Vitamin D (CALTRATE 600+D) 600-400 MG-UNIT per tablet  Take 1 tablet by mouth 2 (two) times daily.    Yes Historical Provider, MD  citalopram (CELEXA) 20 MG tablet Take 20 mg by mouth daily. 08/11/12  Yes Clovis Cao, MD  cloNIDine (CATAPRES) 0.1 MG tablet Take 0.1 mg by mouth 2 (two) times daily.  08/17/12  Yes Clovis Cao, MD  exenatide (BYETTA 10 MCG PEN) 10 MCG/0.04ML SOLN Inject 0.04 mLs (10 mcg total) into the skin 2 (two) times daily. 08/11/12  Yes Clovis Cao, MD  HYDROcodone-acetaminophen (NORCO/VICODIN) 5-325 MG per tablet Take 1 tablet by mouth every 6 (six) hours as needed.   Yes Historical Provider, MD  insulin aspart (NOVOLOG) 100 UNIT/ML injection as directed. Inject 45 units prior to breakfast, 40 units prior to lunch and 40 units prior to evening meal. 08/11/12  Yes Clovis Cao, MD  insulin glargine (LANTUS) 100 UNIT/ML injection Inject 60 Units into the skin 2 (two) times daily. Disp QS for 1 month 08/11/12  Yes Clovis Cao, MD  levothyroxine (SYNTHROID, LEVOTHROID) 50 MCG tablet Take 50 mcg by mouth every morning.  08/24/12  Yes Clovis Cao, MD  losartan-hydrochlorothiazide (HYZAAR) 100-25 MG per tablet TAKE 1 TABLET DAILY 12/24/11  Yes Clovis Cao, MD  methocarbamol (ROBAXIN) 500 MG tablet Take 1 tablet (500 mg total) by mouth every 6 (six) hours as needed. 09/08/12  Yes Barbie Haggis III, PA  naproxen sodium (ANAPROX) 220 MG tablet Take 220 mg by mouth as needed.   Yes Historical Provider, MD  Omega-3 Fatty Acids (FISH OIL) 1000 MG CAPS Take 1,000 mg by mouth 2 (two) times daily. OTC fish oil   Yes Historical Provider, MD  oxyCODONE-acetaminophen (PERCOCET/ROXICET) 5-325 MG per tablet Take 1-2 tablets by mouth every 4 (four) hours as needed (Q4-6 hours PRN). 09/08/12  Yes Barbie Haggis III, PA  loratadine (CLARITIN) 10 MG tablet Take 10 mg by mouth as needed.     Historical Provider, MD  triamcinolone (KENALOG) 0.1 % cream Apply topically as needed. Apply to affected areas on legs    Historical Provider, MD   Physical  Exam: Blood pressure 111/67, pulse 79, temperature 97.5 F (36.4 C), temperature source Oral, resp. rate 16, height 5\' 4"  (1.626 m), weight 78.472 kg (173 lb), SpO2 97.00%. Filed Vitals:   12/28/12 1330 12/28/12 1400 12/28/12 1600 12/28/12 1715  BP: 122/73 109/68 109/70 111/67  Pulse: 76 78 66 79  Temp:  97.4 F (36.3 C) 98.3 F (36.8 C) 97.5 F (36.4 C)  TempSrc:      Resp: 18 18 16 16   Height:      Weight:      SpO2: 99% 99% 100% 97%    Constitutional: Vital signs reviewed.  Patient is a well-developed and well-nourished  in no acute distress and cooperative with exam. Alert and oriented x3.  Head: Normocephalic and atraumatic Mouth: no erythema or exudates, MMM Eyes: PERRL, EOMI, conjunctivae normal,  No scleral icterus.  Neck: Supple, Trachea midline normal ROM, No JVD, mass, thyromegaly, or carotid bruit present.  Cardiovascular: RRR, S1 normal, S2 normal, no MRG, pulses symmetric and intact bilaterally Pulmonary/Chest: CTAB, no wheezes, rales, or rhonchi Abdominal: Soft. Non-tender, non-distended, bowel sounds are normal, no masses, organomegaly, or guarding present.  GU: no CVA tenderness Extremities: No cyanosis no edEma, LUE in sling Neurological: A&O x3, Strength is normal and symmetric bilaterally, cranial nerve II-XII are grossly intact, no focal motor deficit, sensory intact to light touch bilaterally.  Skin: Warm, dry and intact. No rash, cyanosis, or clubbing.  Psychiatric: Normal mood and affect. speech and behavior is normal. Judgment and thought content normal. Cognition and memory are normal.    Labs on Admission:  Basic Metabolic Panel:  Recent Labs Lab 12/28/12 0812  NA 137  K 3.2*  CL 100  GLUCOSE 403*  BUN 9  CREATININE 0.60   Liver Function Tests: No results found for this basename: AST, ALT, ALKPHOS, BILITOT, PROT, ALBUMIN,  in the last 168 hours No results found for this basename: LIPASE, AMYLASE,  in the last 168 hours No results found for  this basename: AMMONIA,  in the last 168 hours CBC:  Recent Labs Lab 12/28/12 0812  HGB 12.6  HCT 37.0   Cardiac Enzymes: No results found for this basename: CKTOTAL, CKMB, CKMBINDEX, TROPONINI,  in the last 168 hours BNP: No components found with this basename: POCBNP,  CBG:  Recent Labs Lab 12/28/12 1011 12/28/12 1121 12/28/12 1215 12/28/12 1455  GLUCAP 351* 293* 280* 249*    Radiological Exams on Admission: No results found.    Time spent:>51mins  Sheila Oats [facility] Pager [phone]  If 7PM-7AM, please contact night-coverage [email or web address] Password [facility] 12/28/2012, 5:38 PM

## 2012-12-28 NOTE — Transfer of Care (Signed)
Immediate Anesthesia Transfer of Care Note  Patient: Sara Graves  Procedure(s) Performed: Procedure(s) (LRB): ROTATOR CUFF REPAIR SHOULDER OPEN (Left)  Patient Location: Patient transported to PACU with oxygen via face mask at 4 Liters / Min  Anesthesia Type: General  Level of Consciousness: awake and alert   Airway & Oxygen Therapy: Patient Spontanous Breathing and Patient connected to face mask oxygen  Post-op Assessment: Report given to PACU RN and Post -op Vital signs reviewed and stable  Post vital signs: Reviewed and stable  Dentition: Teeth and oropharynx remain in pre-op condition  Complications: No apparent anesthesia complications

## 2012-12-28 NOTE — Anesthesia Procedure Notes (Addendum)
Anesthesia Regional Block:  Interscalene brachial plexus block  Pre-Anesthetic Checklist: ,, timeout performed, Correct Patient, Correct Site, Correct Laterality, Correct Procedure, Correct Position, site marked, Risks and benefits discussed,  Surgical consent,  Pre-op evaluation,  At surgeon's request and post-op pain management  Laterality: Left  Prep: chloraprep       Needles:  Injection technique: Single-shot  Needle Type: Stimiplex          Additional Needles:  Procedures: ultrasound guided (picture in chart) and nerve stimulator Interscalene brachial plexus block  Nerve Stimulator or Paresthesia:  Response: 0.5 mA,   Additional Responses:   Narrative:  Start time: 12/28/2012 8:25 AM End time: 12/28/2012 8:35 AM Injection made incrementally with aspirations every 5 mL.  Performed by: Personally  Anesthesiologist: Peyton Najjar MD  Additional Notes: Patient tolerated the procedure well without complications  Interscalene brachial plexus block Procedure Name: Intubation Date/Time: 12/28/2012 9:27 AM Performed by: Christiana Fuchs Pre-anesthesia Checklist: Patient identified, Emergency Drugs available, Suction available, Patient being monitored and Timeout performed Patient Re-evaluated:Patient Re-evaluated prior to inductionOxygen Delivery Method: Circle System Utilized Preoxygenation: Pre-oxygenation with 100% oxygen Intubation Type: IV induction Ventilation: Mask ventilation without difficulty Grade View: Grade I Tube type: Oral Tube size: 7.0 mm Number of attempts: 1 Airway Equipment and Method: stylet,  oral airway and LTA kit utilized Placement Confirmation: ETT inserted through vocal cords under direct vision,  positive ETCO2 and breath sounds checked- equal and bilateral Secured at: 21 cm Tube secured with: Tape Dental Injury: Teeth and Oropharynx as per pre-operative assessment

## 2012-12-28 NOTE — Progress Notes (Signed)
Pt ready for transfer to floor. RN not available

## 2012-12-28 NOTE — Anesthesia Preprocedure Evaluation (Addendum)
Anesthesia Evaluation  Patient identified by MRN, date of birth, ID band Patient awake    Reviewed: Allergy & Precautions, H&P , NPO status , Patient's Chart, lab work & pertinent test results  Airway Mallampati: II TM Distance: >3 FB Neck ROM: full    Dental  (+) Edentulous Upper, Edentulous Lower and Dental Advisory Given   Pulmonary neg pulmonary ROS,  breath sounds clear to auscultation  Pulmonary exam normal       Cardiovascular Exercise Tolerance: Good hypertension, Pt. on medications Rhythm:regular Rate:Normal     Neuro/Psych negative neurological ROS  negative psych ROS   GI/Hepatic negative GI ROS, Neg liver ROS,   Endo/Other  diabetes, Well Controlled, Type 2, Insulin DependentHypothyroidism   Renal/GU negative Renal ROS  negative genitourinary   Musculoskeletal   Abdominal   Peds  Hematology negative hematology ROS (+)   Anesthesia Other Findings   Reproductive/Obstetrics negative OB ROS                          Anesthesia Physical Anesthesia Plan  ASA: III  Anesthesia Plan: General   Post-op Pain Management:    Induction: Intravenous  Airway Management Planned: Oral ETT  Additional Equipment:   Intra-op Plan:   Post-operative Plan: Extubation in OR  Informed Consent: I have reviewed the patients History and Physical, chart, labs and discussed the procedure including the risks, benefits and alternatives for the proposed anesthesia with the patient or authorized representative who has indicated his/her understanding and acceptance.   Dental Advisory Given  Plan Discussed with: CRNA and Surgeon  Anesthesia Plan Comments:         Anesthesia Quick Evaluation

## 2012-12-29 ENCOUNTER — Encounter (HOSPITAL_BASED_OUTPATIENT_CLINIC_OR_DEPARTMENT_OTHER): Payer: Self-pay | Admitting: Orthopedic Surgery

## 2012-12-29 DIAGNOSIS — E039 Hypothyroidism, unspecified: Secondary | ICD-10-CM

## 2012-12-29 DIAGNOSIS — M75102 Unspecified rotator cuff tear or rupture of left shoulder, not specified as traumatic: Secondary | ICD-10-CM | POA: Diagnosis present

## 2012-12-29 LAB — BASIC METABOLIC PANEL
BUN: 8 mg/dL (ref 6–23)
Calcium: 8.8 mg/dL (ref 8.4–10.5)
Creatinine, Ser: 0.46 mg/dL — ABNORMAL LOW (ref 0.50–1.10)
GFR calc non Af Amer: >90 mL/min (ref >90)
Glucose, Bld: 322 mg/dL — ABNORMAL HIGH (ref 70–99)
Potassium: 3.6 mEq/L (ref 3.5–5.1)

## 2012-12-29 LAB — GLUCOSE, CAPILLARY
Glucose-Capillary: 329 mg/dL — ABNORMAL HIGH (ref 70–99)
Glucose-Capillary: 267 mg/dL — ABNORMAL HIGH (ref 70–99)

## 2012-12-29 MED ORDER — METHOCARBAMOL 500 MG PO TABS: 500.0000 mg | ORAL_TABLET | Freq: Four times a day (QID) | ORAL | Status: DC | PRN

## 2012-12-29 MED ORDER — INSULIN GLARGINE 100 UNIT/ML ~~LOC~~ SOLN
60.0000 [IU] | Freq: Two times a day (BID) | SUBCUTANEOUS | Status: DC
  Administered 2012-12-29: 60 [IU] via SUBCUTANEOUS

## 2012-12-29 MED ORDER — OXYCODONE-ACETAMINOPHEN 5-325 MG PO TABS: 1.0000 | ORAL_TABLET | ORAL | Status: DC | PRN

## 2012-12-29 NOTE — Progress Notes (Signed)
Subjective: 1 Day Post-Op Procedure(s) (LRB): ROTATOR CUFF REPAIR SHOULDER OPEN (Left) Patient reports pain as 4 on 0-10 scale.    Objective: Vital signs in last 24 hours: Temp:  [97.4 F (36.3 C)-98.4 F (36.9 C)] 97.9 F (36.6 C) (02/12 0546) Pulse Rate:  [66-89] 69 (02/12 0546) Resp:  [16-21] 16 (02/12 0546) BP: (106-156)/(59-110) 106/73 mmHg (02/12 0546) SpO2:  [92 %-100 %] 99 % (02/12 0546)  Intake/Output from previous day: 02/11 0701 - 02/12 0700 In: 3015 [P.O.:1080; I.V.:1835; IV Piggyback:100] Out: 2525 [Urine:2525] Intake/Output this shift:     Recent Labs  12/28/12 0812  HGB 12.6    Recent Labs  12/28/12 0812  HCT 37.0    Recent Labs  12/28/12 0812 12/29/12 0415  NA 137 131*  K 3.2* 3.6  CL 100 95*  CO2  --  26  BUN 9 8  CREATININE 0.60 0.46*  GLUCOSE 403* 322*  CALCIUM  --  8.8   No results found for this basename: LABPT, INR,  in the last 72 hours  Neurologically intact Neurovascular intact Sensation intact distally  Assessment/Plan: 1 Day Post-Op Procedure(s) (LRB): ROTATOR CUFF REPAIR SHOULDER OPEN (Left) D/C IV fluids  Shyana Kulakowski III,Mathew Postiglione L 12/29/2012, 7:53 AM

## 2012-12-29 NOTE — Care Management Note (Signed)
    Page 1 of 1   12/29/2012     10:08:23 AM   CARE MANAGEMENT NOTE 12/29/2012  Patient:  Sara Graves, Sara Graves   Account Number:  [id number]  Date Initiated:  12/29/2012  Documentation initiated by:  Sherrin Daisy  Subjective/Objective Assessment:   DX: recurrent rotator cuff tear-right; repair of rotator cuff     Action/Plan:   CM spoke with patient. Plans are for her to return to her home in [place] where spouse will be caregiver. She currently has sling appliance in place. States she is able to ambulate. Has cane at home if needed. No HH or DME needs.   Anticipated DC Date:  12/29/2012   Anticipated DC Plan:  [place]  CM consult      Choice offered to / List presented to:             Status of service:  Completed, signed off Medicare Important Message given?   (If response is "NO", the following Medicare IM given date fields will be blank) Date Medicare IM given:   Date Additional Medicare IM given:    Discharge Disposition:    Per UR Regulation:  Reviewed for med. necessity/level of care/duration of stay  If discussed at [place] of Stay Meetings, dates discussed:    Comments:

## 2012-12-29 NOTE — Progress Notes (Signed)
Nutrition Brief Note  Nutrition Consult due to poor po intake.  Body mass index is 29.68 kg/(m^2). Patient meets criteria for overweight based on current BMI.   Current diet order is Carb modified, patient is consuming approximately 100% of meals at this time.    Pt admitted for open repair of rotator cuff of left shoulder. Pt reports that she wasn't eating much PTA due to pain. Pt denies wt loss and reports a good appetite today. Pt loved her dinner last night and ate 100% of the meal. Pt usually follows a diabetic diet at home, takes insulin, and checks blood sugar which is usually WNL per pt. Pt has no questions or concerns. Encouraged adequate po intake, especially during recovery.  Labs and medications reviewed.   No nutrition interventions warranted at this time. If nutrition issues arise, please consult RD.   Pryor Ochoa RD, LDN Inpatient Clinical Dietitian Pager: [phone] After Hours Pager: [phone]

## 2012-12-29 NOTE — Progress Notes (Signed)
OT  Note  Patient Details Name: Sara Graves MRN: [id number] DOB: 26-Dec-1940   Cancelled Treatment:    Reason Eval/Treat Not Completed: Other (comment) (OT Screen). Pt informed not to remove sling and to wear button up shirts over the top, educated how to wash underneath operated arm and to keep fingers and wrist moving throughout the day. Pt presents with no further needs in acute.  Ariannah Arenson A OTR/L [id number] 12/29/2012, 11:49 AM

## 2012-12-29 NOTE — Discharge Summary (Signed)
Physician Discharge Summary  [name] MRN: [id number] DOB/AGE: [birth year] 72 y.o.  Admit date: 12/28/2012 Discharge date: 12/29/2012  Admission Diagnoses:  Discharge Diagnoses:  Active Problems:   HYPOTHYROIDISM, UNSPECIFIED   Diabetes mellitus out of control   HYPERTENSION, BENIGN SYSTEMIC   Hypokalemia   Discharged Condition: stable  Hospital Course: Did well post-op.  Medical consult done due to elevated glucose pre and post-op.  Consults: Medical  Significant Diagnostic Studies: labs: See Chart  Treatments: surgery: Open repair of rotator cuff retear L. Shoulder.  Discharge Exam: Blood pressure 106/73, pulse 69, temperature 97.9 F (36.6 C), temperature source Oral, resp. rate 16, height 5\' 4"  (1.626 m), weight 78.472 kg (173 lb), SpO2 99.00%. Extremities: no edema, redness or tenderness in the calves or thighs. N/V intact in left hand/upper extremity. Dressing dry.  Disposition: 01-Home or Self Care  Discharge Orders   Future Orders Complete By Expires     Discharge instructions  As directed     Scheduling Instructions:      Wear sling/brace at all times.  Keep arm on pillow at your side.  Support elbow with pillows as needed.  Call our office for an appt. To be seen this Friday.        Medication List    TAKE these medications       acetaminophen 325 MG tablet  Commonly known as:  TYLENOL  Take 650 mg by mouth every 6 (six) hours as needed.     atorvastatin 20 MG tablet  Commonly known as:  LIPITOR  TAKE 1 TABLET AT BEDTIME     BAYER CHILDRENS ASPIRIN 81 MG chewable tablet  Generic drug:  aspirin  Chew 81 mg by mouth daily.     CALTRATE 600+D 600-400 MG-UNIT per tablet  Generic drug:  Calcium Carbonate-Vitamin D  Take 1 tablet by mouth 2 (two) times daily.     citalopram 20 MG tablet  Commonly known as:  CELEXA  Take 20 mg by mouth daily.     cloNIDine 0.1 MG tablet  Commonly known as:  CATAPRES  Take 0.1 mg by mouth 2 (two)  times daily.     exenatide 10 MCG/0.04ML Soln  Commonly known as:  BYETTA 10 MCG PEN  Inject 0.04 mLs (10 mcg total) into the skin 2 (two) times daily.     Fish Oil 1000 MG Caps  Take 1,000 mg by mouth 2 (two) times daily. OTC fish oil     HYDROcodone-acetaminophen 5-325 MG per tablet  Commonly known as:  NORCO/VICODIN  Take 1 tablet by mouth every 6 (six) hours as needed.     insulin aspart 100 UNIT/ML injection  Commonly known as:  novoLOG  as directed. Inject 45 units prior to breakfast, 40 units prior to lunch and 40 units prior to evening meal.     insulin glargine 100 UNIT/ML injection  Commonly known as:  LANTUS  Inject 60 Units into the skin 2 (two) times daily. Disp QS for 1 month     levothyroxine 50 MCG tablet  Commonly known as:  SYNTHROID, LEVOTHROID  Take 50 mcg by mouth every morning.     loratadine 10 MG tablet  Commonly known as:  CLARITIN  Take 10 mg by mouth as needed.     losartan-hydrochlorothiazide 100-25 MG per tablet  Commonly known as:  HYZAAR  TAKE 1 TABLET DAILY     methocarbamol 500 MG tablet  Commonly known as:  ROBAXIN  Take 1 tablet (  500 mg total) by mouth every 6 (six) hours as needed.     methocarbamol 500 MG tablet  Commonly known as:  ROBAXIN  Take 1 tablet (500 mg total) by mouth every 6 (six) hours as needed.     naproxen sodium 220 MG tablet  Commonly known as:  ANAPROX  Take 220 mg by mouth as needed.     oxyCODONE-acetaminophen 5-325 MG per tablet  Commonly known as:  PERCOCET/ROXICET  Take 1-2 tablets by mouth every 4 (four) hours as needed (Q4-6 hours PRN).     oxyCODONE-acetaminophen 5-325 MG per tablet  Commonly known as:  PERCOCET/ROXICET  Take 1-2 tablets by mouth every 4 (four) hours as needed.     triamcinolone cream 0.1 %  Commonly known as:  KENALOG  Apply topically as needed. Apply to affected areas on legs         Signed: Meleny Tregoning III,Kyiesha Millward L 12/29/2012, 8:22 AM  22

## 2012-12-29 NOTE — Progress Notes (Signed)
[facility] PROGRESS NOTE/consult  Sara Graves [id number] DOB: [birth year] DOA: 12/28/2012 PCP: Luis Abed, MD  Assessment/Plan: #1 uncontrolled diabetes mellitus CBGs have ranged from 227 to 329. Hemoglobin A1c was 12.5 from 07/07/2012. Patient will need a repeat hemoglobin A1c done all follow up with PCP. Increase Lantus to home dose of 60 units twice daily. Patient was not able to receive her byetta during this hospitalization as pharmacy did not have any and patient was not instructed to bring her home supply. Continue current home regimen as well as sliding scale insulin. Follow  up closely with PCP one week post discharge.  #2 hypokalemia Repleted.  #3hypothyroidism Stable. Continue home dose Synthroid.  #4 hypertension Continue home regimen.  #5 mild hyponatremia Likely secondary to volume depletion. IV fluids were increased to 75 cc per hour this morning. Patient has been instructed on good hydration. Patient will need follow up with PCP one week post discharge for repeat basic metabolic profile.  #6 recurrent rotator cuff tear repair left shoulder Per primary.  Patient is stable from a medical standpoint to be discharged home. Patient however will need close follow up with PCP one week post discharge for repeat basic metabolic profile to follow up on her electrolytes and renal function. Patient also need follow up on her diabetes.  Code Status: full Family Communication: updated patient and husband at bedside. Disposition Plan: Home today.   Consultants:    Procedures:  STATUS POST ANTERIOR ACROMIONECTOMY WITH REPAIR OF RECURRENT ROTATOR CUFF TEAR LEFT SHOULDER 12/28/2012 PER DR APLINGTON  Antibiotics:  None  HPI/Subjective: Patient DENIES ANY CHEST PAIN, NO SHORTNESS OF BREATH.   Objective: Filed Vitals:   12/28/12 2049 12/29/12 0134 12/29/12 0546 12/29/12 0959  BP: 113/69 112/77 106/73 118/68  Pulse: 79 67 69 74  Temp: 98.4 F (36.9 C) 98.1  F (36.7 C) 97.9 F (36.6 C) 98.5 F (36.9 C)  TempSrc:    Oral  Resp: 16 16 16 16   Height:      Weight:      SpO2: 99% 99% 99% 99%    Intake/Output Summary (Last 24 hours) at 12/29/12 1140 Last data filed at 12/29/12 0800  Gross per 24 hour  Intake   2195 ml  Output   2525 ml  Net   -330 ml   Filed Weights   12/24/12 1214 12/28/12 0741  Weight: 88.451 kg (195 lb) 78.472 kg (173 lb)    Exam:   General:  NAD  Cardiovascular: RRR  Respiratory: CTAB  Abdomen: Soft/NT/ND/+BS  Data Reviewed: Basic Metabolic Panel:  Recent Labs Lab 12/28/12 0812 12/29/12 0415  NA 137 131*  K 3.2* 3.6  CL 100 95*  CO2  --  26  GLUCOSE 403* 322*  BUN 9 8  CREATININE 0.60 0.46*  CALCIUM  --  8.8   Liver Function Tests: No results found for this basename: AST, ALT, ALKPHOS, BILITOT, PROT, ALBUMIN,  in the last 168 hours No results found for this basename: LIPASE, AMYLASE,  in the last 168 hours No results found for this basename: AMMONIA,  in the last 168 hours CBC:  Recent Labs Lab 12/28/12 0812  HGB 12.6  HCT 37.0   Cardiac Enzymes: No results found for this basename: CKTOTAL, CKMB, CKMBINDEX, TROPONINI,  in the last 168 hours BNP (last 3 results) No results found for this basename: PROBNP,  in the last 8760 hours CBG:  Recent Labs Lab 12/28/12 1215 12/28/12 1455 12/28/12 1801 12/29/12 0702 12/29/12 1116  GLUCAP  280* 249* 227* 329* 267*    No results found for this or any previous visit (from the past 240 hour(s)).   Studies: No results found.  Scheduled Meds: . citalopram  20 mg Oral Daily  . cloNIDine  0.1 mg Oral BID  . exenatide  10 mcg Subcutaneous BID  . losartan  100 mg Oral Daily   And  . hydrochlorothiazide  25 mg Oral Daily  . insulin aspart  0-15 Units Subcutaneous TID WC  . insulin glargine  60 Units Subcutaneous BID  . levothyroxine  50 mcg Oral QAC breakfast  . midazolam  2 mg Intravenous Once   Continuous Infusions: . sodium  chloride 75 mL/hr at 12/29/12 0808    Active Problems:   HYPOTHYROIDISM, UNSPECIFIED   Diabetes mellitus out of control   HYPERTENSION, BENIGN SYSTEMIC   Hypokalemia   Left rotator cuff tear    Time spent: . 35 mins    [facility] Pager [phone]. If 8PM-8AM, please contact night-coverage at [email or web address], password [facility] 12/29/2012, 11:40 AM  LOS: 1 day

## 2012-12-29 NOTE — Progress Notes (Signed)
Appreciate consult.  Pt. Awake, alert,comfortable this am.  Will discharge if ok with Dr. Duwayne Heck.  F/U with [facility] as per schedule.  Will see in our office Friday, weather permitting.  Must wear sling/brace at all times. She is to call if any prolems.

## 2012-12-29 NOTE — Progress Notes (Signed)
Please note: This patient did NOT have a post-op infection as listed in the Dx area of her chart!

## 2013-02-02 ENCOUNTER — Encounter: Payer: Self-pay | Admitting: *Deleted

## 2013-03-07 ENCOUNTER — Encounter: Payer: Self-pay | Admitting: *Deleted

## 2013-03-16 ENCOUNTER — Ambulatory Visit (INDEPENDENT_AMBULATORY_CARE_PROVIDER_SITE_OTHER): Payer: Medicare Other | Admitting: Family Medicine

## 2013-03-16 ENCOUNTER — Encounter: Payer: Self-pay | Admitting: Family Medicine

## 2013-03-16 VITALS — BP 122/57 | HR 82 | Temp 98.6°F | Ht 64.0 in | Wt 144.5 lb

## 2013-03-16 DIAGNOSIS — I10 Essential (primary) hypertension: Secondary | ICD-10-CM

## 2013-03-16 DIAGNOSIS — L538 Other specified erythematous conditions: Secondary | ICD-10-CM

## 2013-03-16 DIAGNOSIS — E1165 Type 2 diabetes mellitus with hyperglycemia: Secondary | ICD-10-CM

## 2013-03-16 DIAGNOSIS — E039 Hypothyroidism, unspecified: Secondary | ICD-10-CM

## 2013-03-16 DIAGNOSIS — L304 Erythema intertrigo: Secondary | ICD-10-CM | POA: Insufficient documentation

## 2013-03-16 DIAGNOSIS — R634 Abnormal weight loss: Secondary | ICD-10-CM | POA: Insufficient documentation

## 2013-03-16 LAB — CBC
MCH: 32.3 pg (ref 26.0–34.0)
MCHC: 34.2 g/dL (ref 30.0–36.0)
Platelets: 162 10*3/uL (ref 150–400)
RDW: 13.8 % (ref 11.5–15.5)

## 2013-03-16 LAB — COMPREHENSIVE METABOLIC PANEL
ALT: 8 U/L (ref 0–35)
AST: 8 U/L (ref 0–37)
Albumin: 3.9 g/dL (ref 3.5–5.2)
Alkaline Phosphatase: 63 U/L (ref 39–117)
Glucose, Bld: 459 mg/dL — ABNORMAL HIGH (ref 70–99)
Potassium: 3.4 mEq/L — ABNORMAL LOW (ref 3.5–5.3)
Sodium: 135 mEq/L (ref 135–145)
Total Protein: 6 g/dL (ref 6.0–8.3)

## 2013-03-16 LAB — TSH: TSH: 2.078 u[IU]/mL (ref 0.350–4.500)

## 2013-03-16 MED ORDER — NYSTATIN 100000 UNIT/GM EX CREA: TOPICAL_CREAM | Freq: Two times a day (BID) | CUTANEOUS | Status: DC

## 2013-03-16 MED ORDER — FLUCONAZOLE 150 MG PO TABS: 150.0000 mg | ORAL_TABLET | Freq: Once | ORAL | Status: DC

## 2013-03-16 MED ORDER — AMITRIPTYLINE HCL 10 MG PO TABS: 10.0000 mg | ORAL_TABLET | Freq: Every day | ORAL | Status: DC

## 2013-03-16 NOTE — Assessment & Plan Note (Addendum)
Striking rapidity to weight loss from last visit. Records show loss of 50 lbs in past 6 months, with 30 lbs in past 2 months. Could be secondary to uncontrolled back/hip pain vs. Severely uncontrolled diabtetes with A1c >14 and noncompliance with insulin.  Will check labs, renal function, liver, CBC, TSH. Check left hip plain film and back film to rule out pathologic lesions. Check stool cards for FOB. Less likely malignancy since she is UTD on recommended cancer screening colonoscopy in 2011, PAP wnl 2010 (finished screening), mammogram 02/2012. Follow up in 1-2 weeks.

## 2013-03-16 NOTE — Assessment & Plan Note (Signed)
In groin. Secondary to hyperglycemia. Will treat with diflucan and topical nystatin. There is a central fleshy lesion that is new, and may need to be biopsied if does not improve with treatment of candida.

## 2013-03-16 NOTE — Assessment & Plan Note (Signed)
Check TSH with recent weight loss, constipation.

## 2013-03-16 NOTE — Assessment & Plan Note (Signed)
Lower now with weight loss, causing some possible orthostatic symptoms. Will DC clonidine. F/u in 1-2 weeks. Check labs, renal function.

## 2013-03-16 NOTE — Patient Instructions (Addendum)
You need to take your lantus insulin (long acting) even if you don't eat. Use novolog insulin if you eat a meal. Poorly controlled diabetes can cause weight loss.  Stop taking clonidine for now. Bring back stool cards. Get your xray done. Use cream on your rash and take diflucan. Make an appointment for check up in 1-2 weeks.

## 2013-03-16 NOTE — Progress Notes (Signed)
  Subjective:    [name], female    DOB: [birth year], 72 y.o.   MRN: [id number]  Diabetes    1. Weight loss. Patient has lost 50 pounds in the last 6 months. The majority of this is the past 2-3 months in the setting of having undergone a second rotator cuff repair and is having worsening left hip/thigh and back pain s/p spinal stimulator implant. This pain is being managed by her orthopedist currently. They're trying different pain management strategies such as patches and ointment, started amitryptilene. She is not on narcotics currently. The patient feels her appetite is really diminished due to her uncontrolled pain. She is not eating well and also not taking her insulin.  She did note scant blood on her toilet tissue last week. She is having constipation and required an enema which did produce a bowel movement earlier today. Noted scant blood on the tissue after enema placement.  Denies nausea, emesis, abdominal pain, depression, crying spells.   She is up-to-date on her mammogram screening. Had negative Pap 2010. Normal colonoscopy 2011.   2. DM2 uncontrolled. Patient is inconsistent with her insulin administration. Initially states she is taking her Lantus, then states that she only takes it intermittently. She never takes her NovoLog recently due to the fact she is not eating meals regularly. She is also not taking Byetta. States she checked her CBG yesterday and was 160 in the afternoon. Unfortunately her A1c has decompensated to > 14. She does endorse polyuria, polydipsia, weight loss noted above, fatigue, weakness. She does have a poor appetite.  3. Groin rash. Patient notes a pruritic and red groin rash for the past 3 weeks. Mainly located on the left groin with a small raised area. She is using A&D ointment without much improvement. She does note some vaginal itching. Denies fever, chills, vaginal bleeding, discharge, nausea, emesis, diarrhea, dysuria.  4.  Hypothyroid. Endorses compliance with thyroid meds. +constipation, weakness, weight loss per above.  Review of Systems See HPI otherwise negative.  reports that she has never smoked. She has never used smokeless tobacco.     Objective:   Physical Exam  Vitals reviewed. Constitutional: She is oriented to person, place, and time. No distress.  Significant weight loss since last visit. Appears frail.  HENT:  Head: Normocephalic and atraumatic.  Nose: Nose normal.  Mouth/Throat: Oropharynx is clear and moist. No oropharyngeal exudate.  Neck: Neck supple.  Cardiovascular: Normal rate, regular rhythm and normal heart sounds.   No murmur heard. Pulmonary/Chest: Effort normal and breath sounds normal. No respiratory distress. She has no wheezes.  Abdominal: Soft.  Intertrigo rash in inguinal area, L>R with erythema and scant white discharge. A 1 cm fleshy raised lesion noted at left inguinal ligament area, non-tender, no ulceration.   Musculoskeletal: She exhibits tenderness. She exhibits no edema.  Left thigh with voltaren patch in place.  Lymphadenopathy:    She has no cervical adenopathy.  Neurological: She is alert and oriented to person, place, and time.  Skin: She is not diaphoretic.          Assessment & Plan:

## 2013-03-16 NOTE — Assessment & Plan Note (Signed)
Decompensated due to poor appetite and noncompliance with insulin. Discussed with patient important to take lantus insulin even if not eating meals (She can hold novolog). She reports a glucose level of 160 yesterday, which contradicts the A1c and seems very unlikely, question her compliance with glucose testing also. Can DC byetta for now. Check labs today. F/u in one week.

## 2013-03-17 ENCOUNTER — Telehealth: Payer: Self-pay | Admitting: Family Medicine

## 2013-03-17 NOTE — Telephone Encounter (Signed)
Patient's blood sugar was >400 on labs as I expected. I suspect this is chronic given her A1c>14, weight loss, and her normal CO2.  White Team: Please make sure she is taking her lantus twice today and have her call back to report her blood sugars to the office today. See if she can move up her appt scheduled to the next one week.

## 2013-03-17 NOTE — Telephone Encounter (Signed)
Spoke with patient and scheduled appointment for 5/9 @ 9:30am

## 2013-03-18 ENCOUNTER — Telehealth: Payer: Self-pay | Admitting: Family Medicine

## 2013-03-18 NOTE — Telephone Encounter (Signed)
Blood Sugar Readings 5/1 am - 200 5/1 pm - 190 5/2 am - 136

## 2013-03-18 NOTE — Telephone Encounter (Signed)
Will forward to Dr Verlee Rossetti

## 2013-03-21 ENCOUNTER — Ambulatory Visit
Admission: RE | Admit: 2013-03-21 | Discharge: 2013-03-21 | Disposition: A | Discharge status: Home / Self Care | Payer: Medicare Other | Source: Ambulatory Visit | Attending: Family Medicine | Admitting: Family Medicine

## 2013-03-21 DIAGNOSIS — E1165 Type 2 diabetes mellitus with hyperglycemia: Secondary | ICD-10-CM

## 2013-03-21 NOTE — Telephone Encounter (Signed)
Much improved. Will f/u in clinic at her scheduled visit.

## 2013-03-23 NOTE — Telephone Encounter (Signed)
Called to discuss xray results. Appeared to have chronic post-op changes, nothing new to explain her pain. Reports am Blood sugar 125, took lantus insulin 60 units twice a day. She will follow up in clinic on Friday.

## 2013-03-25 ENCOUNTER — Ambulatory Visit (INDEPENDENT_AMBULATORY_CARE_PROVIDER_SITE_OTHER): Payer: Medicare Other | Admitting: Family Medicine

## 2013-03-25 VITALS — BP 142/78 | Temp 97.5°F | Wt 147.0 lb

## 2013-03-25 DIAGNOSIS — M5430 Sciatica, unspecified side: Secondary | ICD-10-CM

## 2013-03-25 DIAGNOSIS — L304 Erythema intertrigo: Secondary | ICD-10-CM

## 2013-03-25 DIAGNOSIS — L538 Other specified erythematous conditions: Secondary | ICD-10-CM

## 2013-03-25 DIAGNOSIS — L989 Disorder of the skin and subcutaneous tissue, unspecified: Secondary | ICD-10-CM

## 2013-03-25 DIAGNOSIS — E1165 Type 2 diabetes mellitus with hyperglycemia: Secondary | ICD-10-CM

## 2013-03-25 MED ORDER — CEPHALEXIN 500 MG PO CAPS: 500.0000 mg | ORAL_CAPSULE | Freq: Three times a day (TID) | ORAL | Status: DC

## 2013-03-25 MED ORDER — FLUCONAZOLE 150 MG PO TABS: ORAL_TABLET | ORAL | Status: DC

## 2013-03-25 NOTE — Assessment & Plan Note (Signed)
Plans to start PT per Dr. Collier Salina.

## 2013-03-25 NOTE — Patient Instructions (Addendum)
If you have uncontrolled bleeding, worsened pain, oozing or other problems then call MD or make an appointment. Keep biopsy site covered for 24 hours.  Change bandage daily.  Use antibiotic ointment.  Make an appointment with health coach. Make an appointmetn with Dr. Verlee Rossetti in 2-3 weeks.  Biopsy Care After Refer to this sheet in the next few weeks. These instructions provide you with information on caring for yourself after your procedure. Your caregiver may also give you more specific instructions. Your treatment has been planned according to current medical practices, but problems sometimes occur. Call your caregiver if you have any problems or questions after your procedure. If you had a fine needle biopsy, you may have soreness at the biopsy site for 1 to 2 days. If you had an open biopsy, you may have soreness at the biopsy site for 3 to 4 days. HOME CARE INSTRUCTIONS   You may resume normal diet and activities as directed.  Change bandages (dressings) as directed. If your wound was closed with a skin glue (adhesive), it will wear off and begin to peel in 7 days.  Only take over-the-counter or prescription medicines for pain, discomfort, or fever as directed by your caregiver.  Ask your caregiver when you can bathe and get your wound wet. SEEK IMMEDIATE MEDICAL CARE IF:   You have increased bleeding (more than a small spot) from the biopsy site.  You notice redness, swelling, or increasing pain at the biopsy site.  You have pus coming from the biopsy site.  You have a fever.  You notice a bad smell coming from the biopsy site or dressing.  You have a rash, have difficulty breathing, or have any allergic problems. MAKE SURE YOU:   Understand these instructions.  Will watch your condition.  Will get help right away if you are not doing well or get worse. Document Released: 05/23/2005 Document Revised: 01/26/2012 Document Reviewed: 05/01/2011 [facility] Patient Information  2013 [place].

## 2013-03-25 NOTE — Assessment & Plan Note (Addendum)
Left groin lesion shave biopsy. Rapidly enlarging per patient. R/o malignancy. Keflex to prevent cellulitis in patient with uncontrolled diabetes.  Pt examined with Dr. Verlon Au.   Shave Biopsy Procedure Note Pre-operative Diagnosis: Suspicious lesion  Post-operative Diagnosis: same  Locations:left groin  Indications: r/o malignancy Anesthesia: Lidocaine 1% with epinephrine without added sodium bicarbonate  Procedure Details  History of allergy to iodine: no  Patient informed of the risks (including bleeding and infection) and benefits of the  procedure and Written informed consent obtained.  The lesion and surrounding area were given a sterile prep using alcohol and draped in the usual sterile fashion. A scalpel was used to shave an area of skin approximately 1.5cm by 5cm.  Hemostasis achieved with alumuninum chloride. Antibiotic ointment and a sterile dressing applied.  The specimen was sent for pathologic examination. The patient tolerated the procedure well.  EBL: 1 ml  Condition: Stable  Complications: none.  Plan: 1. Instructed to keep the wound dry and covered for 24-48h and clean thereafter. 2. Warning signs of infection were reviewed.   3. Recommended that the patient use Naproxen as needed for pain.  4. Return in 2 weeks.

## 2013-03-25 NOTE — Assessment & Plan Note (Signed)
Repeat treatment for slight residual groin involvement.

## 2013-03-25 NOTE — Progress Notes (Signed)
  Subjective:    [name], female    DOB: 1941/07/17, 72 y.o.   MRN: [id number]  HPI  1. Left groin rash/lesion. This growth is enlarging and mildly painful at times. Bleeds when she rubs it or cleans the area. The candidal rash is improving and regressing. Itching improved. Had normal pap until age 50.  No fever, chills, nausea, emesis, syncope.  2. Uncontrolled DM2. Has restarted the lantus insulin and reports fasting CBGs mid-100s to highest of 200. She is trying to eat regularly. Still with some fatigue. Weight increased few lbs since last visit.   3. Back/leg pain. Saw Dr. Collier Salina this morning. He prescribed PT she reports. Using lidocaine patches for pain currently. Her thigh pain is stable. Reviewed xrays we obtained reveals hx of surgeries and no new findings to explain her pain symptoms. No falls, numbness or weakness reported.  Review of Systems See HPI otherwise negative.  reports that she has never smoked. She has never used smokeless tobacco.     Objective:   Physical Exam  Vitals reviewed. Constitutional: She is oriented to person, place, and time. She appears well-developed and well-nourished. No distress.  HENT:  Head: Normocephalic and atraumatic.  Musculoskeletal:  Lidocaine patch on left thigh. 5/5 hip flexion strength bilaterally.  Neurological: She is alert and oriented to person, place, and time.  Skin: Rash noted. She is not diaphoretic. No erythema.  Improved intertrigo erythema. No exudate.  Left groin with pedunculated, ?verrucous mass about 1.5 cm in diameter. Has a spiculated appearance.        Assessment & Plan:

## 2013-03-25 NOTE — Assessment & Plan Note (Signed)
Reports improved hyperglycemia. Encourage meal time insulin use. Suspect this could have contributed to severe weight loss. F/u with health coach recommended and in clinic in 2 weeks.

## 2013-03-29 ENCOUNTER — Telehealth: Payer: Self-pay | Admitting: Family Medicine

## 2013-03-29 NOTE — Telephone Encounter (Signed)
Please inform patient her pathology shows a wart. Nothing dangerous.

## 2013-03-29 NOTE — Telephone Encounter (Signed)
Spoke with patient and informed her of below 

## 2013-03-30 ENCOUNTER — Ambulatory Visit: Payer: Medicare Other | Admitting: Family Medicine

## 2013-04-08 NOTE — Addendum Note (Signed)
Addended by: [place], Quiera Diffee on: 04/08/2013 05:17 PM   Modules accepted: Orders

## 2013-04-12 ENCOUNTER — Encounter: Payer: Self-pay | Admitting: Family Medicine

## 2013-04-15 ENCOUNTER — Ambulatory Visit: Payer: Medicare Other | Admitting: Family Medicine

## 2013-04-22 ENCOUNTER — Telehealth: Payer: Self-pay | Admitting: Family Medicine

## 2013-04-22 NOTE — Telephone Encounter (Signed)
Will fwd to MD for review.  Hennie Gosa L, CMA  

## 2013-04-22 NOTE — Telephone Encounter (Signed)
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  He was going to get back to Dr. Verlee Rossetti about putting the patient on some medication.  She is scheduled to be seen by Dr. Verlee Rossetti on 6/11 but was hoping for something for relief before then.

## 2013-04-22 NOTE — Telephone Encounter (Signed)
Spoke with patient and told patient Dr. Verlee Rossetti not in the office until the day of her appt, 04/27/2013.  I offered pt an appt today with another MD, and patient stated she would wait to see Dr. Verlee Rossetti.  I instructed pt if she felt pain too severe over the w/e to go to Urgent Care or ED.  Pt verbalized understanding.   Zack Crager, Loralyn Freshwater, [place]

## 2013-04-27 ENCOUNTER — Encounter: Payer: Self-pay | Admitting: Family Medicine

## 2013-04-27 ENCOUNTER — Ambulatory Visit (INDEPENDENT_AMBULATORY_CARE_PROVIDER_SITE_OTHER): Payer: Medicare Other | Admitting: Family Medicine

## 2013-04-27 ENCOUNTER — Ambulatory Visit
Admission: RE | Admit: 2013-04-27 | Discharge: 2013-04-27 | Disposition: A | Discharge status: Home / Self Care | Payer: Medicare Other | Source: Ambulatory Visit | Attending: Family Medicine | Admitting: Family Medicine

## 2013-04-27 VITALS — BP 137/77 | HR 94 | Temp 97.8°F | Ht 64.0 in | Wt 143.3 lb

## 2013-04-27 DIAGNOSIS — IMO0001 Reserved for inherently not codable concepts without codable children: Secondary | ICD-10-CM

## 2013-04-27 DIAGNOSIS — L304 Erythema intertrigo: Secondary | ICD-10-CM

## 2013-04-27 DIAGNOSIS — G5790 Unspecified mononeuropathy of unspecified lower limb: Secondary | ICD-10-CM | POA: Insufficient documentation

## 2013-04-27 DIAGNOSIS — L538 Other specified erythematous conditions: Secondary | ICD-10-CM

## 2013-04-27 DIAGNOSIS — G5792 Unspecified mononeuropathy of left lower limb: Secondary | ICD-10-CM

## 2013-04-27 DIAGNOSIS — R634 Abnormal weight loss: Secondary | ICD-10-CM

## 2013-04-27 DIAGNOSIS — E1165 Type 2 diabetes mellitus with hyperglycemia: Secondary | ICD-10-CM

## 2013-04-27 LAB — C-REACTIVE PROTEIN: CRP: <0.5 mg/dL (ref <0.60)

## 2013-04-27 LAB — POCT SEDIMENTATION RATE: POCT SED RATE: 19 mm/hr (ref 0–22)

## 2013-04-27 MED ORDER — CAPSAICIN 0.1 % EX CREA: 1.0000 "application " | TOPICAL_CREAM | Freq: Four times a day (QID) | CUTANEOUS | Status: DC

## 2013-04-27 MED ORDER — GABAPENTIN 100 MG PO CAPS: 100.0000 mg | ORAL_CAPSULE | Freq: Three times a day (TID) | ORAL | Status: DC

## 2013-04-27 NOTE — Assessment & Plan Note (Signed)
Has lost few more pounds. Possibly a combination of her severe pain from the peripheral neuropathy and her lack of compliance with diabetic medication. To be thorough, will check inflammatory markers, chest x-ray. She has negative fecal occult blood, basic labs, thyroid function. She will receive her mammogram in one month. We discussed her adding a Glucerna protein shake to her meals. Follow up in 2 weeks.

## 2013-04-27 NOTE — Assessment & Plan Note (Signed)
Resolved with glycemic control and Diflucan.

## 2013-04-27 NOTE — Progress Notes (Signed)
  Subjective:    [name], female    DOB: 12-15-1940, 72 y.o.   MRN: [id number]  HPI  1. F/u left thigh pain. Pain is persistent. She reports having nerve conduction study by Dr. Nelva Bush at [facility] orth[facility] showing a peripheral neuropathy. She was sent back here to start on medication. If this fails, she reports they may be able to place a lead wire from her back stimulator. She is taking amitryptilene 30 mg qhs with minimal benefit, but tolerating well. Has tried lyrica for one week (samples) without improvement. Tried a compounded ointment for few days without much benefit. There is no swelling, redness, injury to the area reported.  2. Unintentional weight loss. Continues to have lost a few lbs since last visit. She thinks she is eating a little bit more. She is considering starting on protein shakes. She did report some sweats last night as a one-time occurrence. She denies any nausea, vomiting, diarrhea, blood in stool, cough, chills, dyspnea, abdominal pain, chest pain, syncope, breast masses, vaginal bleeding. Patient is due for mammogram next month as scheduled. She had negative stool cards x3.  3. candidiasis intertrigo. Her rash is improved in the groin region. There is no more itching or redness. No dysuria.  4. DM2. Patient reports that her blood sugars are now improved with compliance NovoLog and Lantus. Her highest recent value is low 200 range.  Review of Systems See HPI otherwise negative.  reports that she has never smoked. She has never used smokeless tobacco.     Objective:   Physical Exam  Vitals reviewed. Constitutional: She is oriented to person, place, and time. No distress.  Appears slightly more energetic. Thin and frail  HENT:  Head: Normocephalic and atraumatic.  Mouth/Throat: Oropharynx is clear and moist. No oropharyngeal exudate.  Eyes: EOM are normal. Pupils are equal, round, and reactive to light.  Neck: Neck supple. No thyromegaly present.   Cardiovascular: Normal rate, regular rhythm and normal heart sounds.   No murmur heard. Pulmonary/Chest: Effort normal and breath sounds normal. No respiratory distress. She has no wheezes. She has no rales.  Abdominal: Soft. Bowel sounds are normal. She exhibits no distension. There is no tenderness. There is no rebound and no guarding.  Genitourinary:  Groin intertrigo appear healed with no residual erythema, induration or discharge. Biopsy site on left inguinal fold appears healing with trace pink scarring.  Musculoskeletal: She exhibits no edema and no tenderness.  Lymphadenopathy:    She has no cervical adenopathy.  Neurological: She is alert and oriented to person, place, and time.  Ascends exam table without assistance.  Skin: She is not diaphoretic.  Psychiatric: She has a normal mood and affect.          Assessment & Plan:

## 2013-04-27 NOTE — Patient Instructions (Addendum)
Start gabapentin at night, then increase to 3 times daily as tolerated.  Start using using glucerna protein shakes.  Apply capsaicin cream (over the counter) use four times daily. May cause some redness Get your mammogram. Get your chest xray.  Make an appointment for check up in 2 weeks.

## 2013-04-27 NOTE — Assessment & Plan Note (Signed)
Endorses improved control. She has frequent bouts of total noncompliance with insulin. Suspect this may be related to her weight loss also. Encourage her to continue compliance for now. Follow up in 2 weeks.

## 2013-04-27 NOTE — Assessment & Plan Note (Signed)
Diagnosed on nerve conduction studies. Will attempt to receive these records. I agree with attempting medical management prior to rewiring her back stimulator. She is already started on a TCA. We'll add a low dose Neurontin. Also advise she can try topical capsaicin cream from OTC. She'll follow up in 2 weeks. Will consider increasing the gabapentin dose as tolerated to 300 mg.

## 2013-04-28 ENCOUNTER — Telehealth: Payer: Self-pay | Admitting: *Deleted

## 2013-04-28 LAB — ANA: Anti Nuclear Antibody(ANA): POSITIVE — AB

## 2013-04-28 LAB — ANTI-NUCLEAR AB-TITER (ANA TITER): ANA Titer 1: 1:40 {titer} — ABNORMAL HIGH

## 2013-04-28 NOTE — Telephone Encounter (Signed)
Patient had office visit today with Dr. Verlee Rossetti and was Rx'd Neurontin.  Patient had a message on her voicemail from Dr. Nelva Bush to get an injection in her back pain due to a nerve pressing on her leg.  Scheduled to get injection next Wednesday (05/04/13) and wants to know if she should start the Neurontin.  Discussed with Dr. Verlee Rossetti and okay to start Neurontin.  Patient also informed that CXR was normal.  Nolene Ebbs, RN

## 2013-05-02 ENCOUNTER — Telehealth: Payer: Self-pay | Admitting: Family Medicine

## 2013-05-02 DIAGNOSIS — G629 Polyneuropathy, unspecified: Secondary | ICD-10-CM

## 2013-05-02 NOTE — Telephone Encounter (Signed)
I believe cabel is a neurosurgeon which is different from what I intended to send to medical neurology. It should be okay for her to still get the pain treatment, but she needs to see the neurologist also. I don't think it matters if she gets the nerve block before or after she sees the neurologist.

## 2013-05-02 NOTE — Telephone Encounter (Signed)
Pt has pain blockage treatments wed. Wants to know if she should continue or wait until she sees a neurolgist. Please advise

## 2013-05-02 NOTE — Telephone Encounter (Signed)
Reviewed the EMG/NCS study from Dr. 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 This is motor and sensory involvement. This has a very broad differential, with a positive ANA and significant weight loss. Patients states she plans to get a nerve block set up by her orthopedist.  We will refer pt to Neurology at this point, to rule out chronic degeneration, concern being for demyelinating or inflammatory conditions, vasculitis, etc.   Discussed case with Dr. Gwendlyn Deutscher precepting.

## 2013-05-02 NOTE — Telephone Encounter (Signed)
Pt scheduled for nerve blocking treatments started Wednesday with Dr. Nelva Bush at [facility].  Dr. Verlee Rossetti called and told pt had a positive ANA.  Pt wondering she needs to see neurologist first or may go ahead and have nerve blocking treatments?  Will fwd to Md for advice.    Also, patient states her family members had seen Dr. Larose Hires who is a neurologists and recommended him. Will fwd to [employer].  Darlena Koval, Loralyn Freshwater, [place]

## 2013-05-03 NOTE — Telephone Encounter (Signed)
Pt notified ok to have nerve blocking treatments and that Dr. Larose Hires is a neurosurgeon, not a neurologist, which Dr. Verlee Rossetti recommends. Pt verbalized understanding.   Sara Graves, Loralyn Freshwater, [place]

## 2013-05-12 ENCOUNTER — Ambulatory Visit: Payer: Medicare Other | Admitting: Family Medicine

## 2013-05-16 ENCOUNTER — Telehealth: Payer: Self-pay | Admitting: Family Medicine

## 2013-05-16 NOTE — Telephone Encounter (Signed)
Referral in [facility] workque.  Gave pt their phone number, [phone] to call and see when her appt may be.  Jager Koska, Loralyn Freshwater, [place]

## 2013-05-16 NOTE — Telephone Encounter (Signed)
Pt states Dr Verlee Rossetti was suppose to give her referral to neurologist. She has not received the referral Please advise

## 2013-05-26 ENCOUNTER — Encounter: Payer: Self-pay | Admitting: Neurology

## 2013-05-26 ENCOUNTER — Ambulatory Visit (INDEPENDENT_AMBULATORY_CARE_PROVIDER_SITE_OTHER): Payer: Medicare Other | Admitting: Neurology

## 2013-05-26 VITALS — BP 141/90 | HR 99 | Temp 97.2°F | Ht 62.0 in | Wt 138.0 lb

## 2013-05-26 DIAGNOSIS — M79609 Pain in unspecified limb: Secondary | ICD-10-CM

## 2013-05-26 DIAGNOSIS — G5792 Unspecified mononeuropathy of left lower limb: Secondary | ICD-10-CM

## 2013-05-26 DIAGNOSIS — G5790 Unspecified mononeuropathy of unspecified lower limb: Secondary | ICD-10-CM

## 2013-05-26 MED ORDER — GABAPENTIN 100 MG PO CAPS: 200.0000 mg | ORAL_CAPSULE | Freq: Three times a day (TID) | ORAL | Status: DC

## 2013-05-26 NOTE — Progress Notes (Signed)
Subjective:    [name] is a 72 y.o. female.  HPI   Star Age, MD, PhD [facility] [address] [place], [place] [address]   Dear Dr. Gwendlyn Deutscher,  I saw your patient, Sara Graves, upon your kind request in my neurologic clinic today for initial consultation of her neuropathy. The patient is unaccompanied today. As you know, Sara Graves is a very pleasant 72 year old right-handed woman with an underlying medical history of hypothyroidism, DM, HTN, recently had EMG nerve conduction study which showed abnormalities concerning both axonal and demyelinating neuropathy in the lower extremities she has a history of positive ANA. As far as her symptoms, she has had pain in the L thigh in the lateral aspect since 3/14. She has been on Gabapentin for 1 month and amitriptyline for about 2 months without much benefit. There is a grabbing and pinching sensation and at times a burning. She has a pain stimulator in place since 2009 for ongoing LBP and she is s/p lumbar decompression surgery in 2008. She had a an injection treatment with Dr. Nelva Bush about a month ago without relief. She denies numbness or tingling in the distal LEs, although she is diabetic for over 10 years. She has had no injury or fall recently. She has lost wt since the beginning of the year with loss of appetite. She is very tearful. She endorses depression and denies SI or HI. She lives at home with her husband and her 86 yo son, who has muscular dystrophy, and has had a significant decline in the last 2 years. She feels globally weak.  For her wt loss, she had hemoccult testing and CXR, non-revealing. She has not had a colonoscopy. She has had colonic polyps before, one of which per her report was precancerous. She had seen Dr. Amedeo Plenty for this.  She had not been taking her insulin as prescribed at times and her BS have been high. Her A1c had been high, even above 14 at times. Her  fasting CBS values at home had been up to 200, but lately she has been better with taking her insulin and numbers have improved.  No recent insect bite, no rash.   Her Past Medical History Is Significant For: Past Medical History  Diagnosis Date  . H. pylori infection 2008 and 1998    treated  . Multinodular goiter   . Hx of colonoscopy with polypectomy   . Depression   . Hyperlipidemia   . Hypertension   . Arthritis   . Urge urinary incontinence   . Diabetes mellitus, type II, insulin dependent   . Rotator cuff tear, left recurrent     Her Past Surgical History Is Significant For: Past Surgical History  Procedure Laterality Date  . 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 Lipoma excision      RIGHT ELBOW  . Excision ganglion cyst left ring finger  01-17-2009  . Implantation permanent spinal cord stimulator  06-15-2008    JUNE 2013--  BATTERY CHANGE  . Foraminal decompression at l2 to the sacrum  01-05-2008    L2  -  S1  . Rotator cuff repair  03-28-2003    RIGHT SHOULDER  DEGENERATIVE AC JOINT AND RC TEAR  . Bladder neck suspension  1970'S  . Bilateral bunionectomies    . Hammertoe repair      BILATERAL  . Vaginal hysterectomy  1970'S    W/ BILATERAL  SALPINGO-OOPHORECTOMY  . Appendectomy  AGE 74  . Tonsillectomy  AGE 46  . Shoulder open rotator cuff repair  09/07/2012    Procedure: ROTATOR CUFF REPAIR SHOULDER OPEN;  Surgeon: Magnus Sinning, MD;  Location: [place];  Service: Orthopedics;  Laterality: Left;  OPEN ANTERIOR ACROMIONECTOMY AND ROTATOR CUFF REPAIR ON LEFT   . Shoulder open rotator cuff repair Left 12/28/2012    Procedure: ROTATOR CUFF REPAIR SHOULDER OPEN;  Surgeon: Magnus Sinning, MD;  Location: [place];  Service: Orthopedics;  Laterality: Left;  OPEN SHOULDER ROTATOR CUFF REPAIR ON LEFT  WITH ANTERIOR ACROMINECTOMY ANESTHESIA: GENERAL/SCALENE NERVE BLOCK    Her Family History Is Significant  For: Family History  Problem Relation Age of Onset  . Cancer Mother     breast  . Heart Problems Mother   . Muscular dystrophy Son   . Cancer Maternal Grandmother     breast  . Heart disease Maternal Grandfather   . Prostate cancer Father   . Breast cancer Sister     Her Social History Is Significant For: History   Social History  . Marital Status: Married    Spouse Name: N/A    Number of Children: 2  . Years of Education: 11th   Occupational History  . Retired    Social History Main Topics  . Smoking status: Never Smoker   . Smokeless tobacco: Never Used  . Alcohol Use: No  . Drug Use: No  . Sexually Active: None   Other Topics Concern  . None   Social History Narrative   Pt lives with her husband and 1 grown son in [place]. Younger son has muscular dystrophy. Having a difficult time as primary caregiver to son who is not doing well.  Declined respite care/placement.   Caffeine Use: none; very little    Her Allergies Are:  Allergies  Allergen Reactions  . Morphine Other (See Comments)    SEVERE HYPOTENSION   . Rosiglitazone Maleate Swelling      (ANTIDIABETIC MED.)  . Tramadol-Acetaminophen Rash  :   Her Current Medications Are:  Outpatient Encounter Prescriptions as of 05/26/2013  Medication Sig Dispense Refill  . amitriptyline (ELAVIL) 10 MG tablet Take 1 tablet (10 mg total) by mouth at bedtime. Take 1-3 tabs qhs      . aspirin (BAYER CHILDRENS ASPIRIN) 81 MG chewable tablet Chew 81 mg by mouth daily.       Marland Kitchen atorvastatin (LIPITOR) 20 MG tablet TAKE 1 TABLET AT BEDTIME  90 tablet  2  . Calcium Carbonate-Vitamin D (CALTRATE 600+D) 600-400 MG-UNIT per tablet Take 1 tablet by mouth 2 (two) times daily.       . Capsaicin 0.1 % CREA Apply 1 application topically 4 (four) times daily.  43 g  2  . exenatide (BYETTA 10 MCG PEN) 10 MCG/0.04ML SOLN Inject 0.04 mLs (10 mcg total) into the skin 2 (two) times daily.  1.2 mL  11  . gabapentin (NEURONTIN) 100 MG  capsule Take 1 capsule (100 mg total) by mouth 3 (three) times daily.  90 capsule  3  . HYDROcodone-acetaminophen (NORCO/VICODIN) 5-325 MG per tablet Take 1 tablet by mouth every 6 (six) hours as needed for pain.      Marland Kitchen insulin aspart (NOVOLOG) 100 UNIT/ML injection as directed. Inject 45 units prior to breakfast, 40 units prior to lunch and 40 units prior to evening meal.      . insulin glargine (LANTUS) 100 UNIT/ML injection  Inject 60 Units into the skin 2 (two) times daily. Disp QS for 1 month  10 mL  11  . levothyroxine (SYNTHROID, LEVOTHROID) 50 MCG tablet Take 50 mcg by mouth every morning.       Marland Kitchen losartan-hydrochlorothiazide (HYZAAR) 100-25 MG per tablet TAKE 1 TABLET DAILY  90 tablet  2  . Omega-3 Fatty Acids (FISH OIL) 1000 MG CAPS Take 1,000 mg by mouth 2 (two) times daily. OTC fish oil      . [DISCONTINUED] acetaminophen (TYLENOL) 325 MG tablet Take 650 mg by mouth every 6 (six) hours as needed.      . [DISCONTINUED] citalopram (CELEXA) 20 MG tablet Take 20 mg by mouth daily.      . [DISCONTINUED] fluconazole (DIFLUCAN) 150 MG tablet Take 1 tab today and one tab in 3 days  2 tablet  0  . [DISCONTINUED] loratadine (CLARITIN) 10 MG tablet Take 10 mg by mouth as needed.       . [DISCONTINUED] methocarbamol (ROBAXIN) 500 MG tablet Take 1 tablet (500 mg total) by mouth every 6 (six) hours as needed.  30 tablet  3  . [DISCONTINUED] naproxen sodium (ANAPROX) 220 MG tablet Take 220 mg by mouth as needed.      . [DISCONTINUED] nystatin cream (MYCOSTATIN) Apply topically 2 (two) times daily. groin  30 g  0  . [DISCONTINUED] oxyCODONE-acetaminophen (PERCOCET/ROXICET) 5-325 MG per tablet Take 1-2 tablets by mouth every 4 (four) hours as needed (Q4-6 hours PRN).  30 tablet  0  . [DISCONTINUED] triamcinolone (KENALOG) 0.1 % cream Apply topically as needed. Apply to affected areas on legs       No facility-administered encounter medications on file as of 05/26/2013.  : Review of Systems   Constitutional: Positive for fatigue and unexpected weight change (weight loss).    Objective:  Neurologic Exam  Physical Exam Physical Examination:   Filed Vitals:   05/26/13 1339  BP: 141/90  Pulse: 99  Temp: 97.2 F (36.2 C)    General Examination: The patient is a very pleasant 72 y.o. female in no acute distress. She appears frail and deconditioned. she is tearful. She appears to have lost weight as her skin is sagging in her arms and thighs.   HEENT: Normocephalic, atraumatic, pupils are equal, round and reactive to light and accommodation. Extraocular tracking is good without limitation to gaze excursion or nystagmus noted. Normal smooth pursuit is noted. Hearing is grossly intact. Tympanic membranes are clear bilaterally with some obscuration on the L with dry cerumen. Face is symmetric with normal facial animation and normal facial sensation. Speech is clear with no dysarthria noted. There is no hypophonia. There is no lip, neck/head, jaw or voice tremor. Neck is supple with full range of passive and active motion. There are no carotid bruits on auscultation. Oropharynx exam reveals: mild mouth dryness, adequate dental hygiene and mild airway crowding, due to narrow airway. Mallampati is class II. Tongue protrudes centrally and palate elevates symmetrically.   Chest: Clear to auscultation without wheezing, rhonchi or crackles noted.  Heart: S1+S2+0, regular and normal without murmurs, rubs or gallops noted.   Abdomen: Soft, non-tender and non-distended with normal bowel sounds appreciated on auscultation.  Extremities: There is no pitting edema in the distal lower extremities bilaterally. Pedal pulses are intact.  Skin: Warm and dry without trophic changes noted. There are no varicose veins.  Musculoskeletal: exam reveals no obvious joint deformities, tenderness or joint swelling or erythema.   Neurologically:  Mental status:  The patient is awake, alert and oriented in  all 4 spheres. Her memory, attention, language and knowledge are appropriate. There is no aphasia, agnosia, apraxia or anomia. Speech is clear with normal prosody and enunciation. Thought process is linear. Mood is congruent and affect is labile.  Cranial nerves are as described above under HEENT exam. In addition, shoulder shrug is normal with equal shoulder height noted. Motor exam: bulk seems less with sagging skin globally, strength and tone is noted to be normal, except for mild L hip flexor weakness. There is no drift, tremor or rebound. Romberg is negative. Reflexes are 2+ in the UEs, trace in both knees and absent in both ankles. Toes are downgoing bilaterally. Fine motor skills are intact with normal finger taps, normal hand movements, normal rapid alternating patting, normal foot taps and normal foot agility.  Cerebellar testing shows no dysmetria or intention tremor on finger to nose testing. There is no truncal or gait ataxia.  Sensory exam is intact to light touch, pinprick, vibration, temperature sense and proprioception in the upper and lower extremities. She has no fasciculations.  Gait, station and balance are unremarkable, no limp.                Assessment and Plan:   Assessment and Plan:  In summary, Sara Graves is a very pleasant 72 y.o.-year old female with a history of pain in her L thigh. She has a Hx of positive ANA, and has had difficulty with blood sugar control for many months. Differential diagnosis includes radiculopathy, mononeuropathy, myopathy. Her exam is not typical for diabetic polyneuropathy.  I had a long chat with the patient about her symptoms, my findings and the further diagnostic tests. We talked about medical treatments and non-pharmacological approaches. We talked about maintaining a healthy lifestyle in general. I encouraged the patient to eat healthy, exercise daily and keep well hydrated, to keep a scheduled bedtime and wake time routine, to not skip any  meals and eat healthy snacks in between meals and to have protein with every meal. I asked her to get in touch with her GI physician regarding another colonoscopy in light of a prior history of polyps and her recent significant unplanned weight loss.  As far as further diagnostic testing is concerned, I suggested the following today: repeat EMG and NCV test to look specifically for mononeuropathy vs. Polyneuropathy vs. Radiculopathy. We will also do additional blood work today. I advised her strongly to get better glucose control which will be key.  As far as medications are concerned, I recommended the following at this time: gradual increase in gabapentin to 200 mg tid.  She was in touch with the rep from [place] was trying to get in touch with a doctor in [place] regarding running a second electrode with her neuromodulation device.  I answered all her questions today and the patient was in agreement with the above outlined plan. I would like to see the patient back in 3 months, sooner if the need arises and encouraged her to call with any interim questions, concerns, problems or updates and refill requests and test results.   Thank you very much for allowing me to participate in the care of this nice patient. If I can be of any further assistance to you please do not hesitate to call me at [phone].  Sincerely,   Star Age, MD, PhD

## 2013-05-26 NOTE — Patient Instructions (Addendum)
I think overall you are doing fairly well but I do want to suggest a few things today:  Remember to drink plenty of fluid, eat healthy meals and do not skip any meals. Try to eat protein with a every meal and eat a healthy snack such as fruit or nuts in between meals. Try to keep a regular sleep-wake schedule and try to exercise daily, particularly in the form of walking, 20-30 minutes a day, if you can.   As far as your medications are concerned, I would like to suggest: Increase your gabapentin to 2 pills three times a day, gradually: 1-1-2 for 3 days, then 2-1-2 pills for 3 days, then 2 pills three times a day thereafter, remember, it can be sedating and impair your ability.   As far as diagnostic testing: electrical nerve and muscle testing to your L leg. Blood work.   Please contact your GI physician (Dr. Amedeo Plenty) regarding your weight loss and the need to perhaps repeat your colonoscopy.  I would like to see you back in 3 months, sooner if we need to. Please call us with any interim questions, concerns, problems, updates or refill requests.  Please also call us for any test results so we can go over those with you on the phone. Richardson Landry is my clinical assistant and will answer any of your questions and relay your messages to me and also relay most of my messages to you.  Our phone number is [phone]. We also have an after hours call service for urgent matters and there is a physician on-call for urgent questions. For any emergencies you know to call 911 or go to the nearest emergency room.

## 2013-05-30 LAB — IFE AND PE, SERUM
Alpha 1: 0.2 g/dL (ref 0.1–0.4)
Alpha2 Glob SerPl Elph-Mcnc: 0.8 g/dL (ref 0.4–1.2)
B-Globulin SerPl Elph-Mcnc: 0.9 g/dL (ref 0.6–1.3)
IgA/Immunoglobulin A, Serum: 191 mg/dL (ref 91–414)
IgM (Immunoglobulin M), Srm: 137 mg/dL (ref 40–230)

## 2013-05-30 LAB — COMPREHENSIVE METABOLIC PANEL
ALT: 13 IU/L (ref 0–32)
AST: 9 IU/L (ref 0–40)
Alkaline Phosphatase: 53 IU/L (ref 39–117)
BUN/Creatinine Ratio: 16 (ref 11–26)
CO2: 28 mmol/L (ref 18–29)
Chloride: 96 mmol/L — ABNORMAL LOW (ref 97–108)
Creatinine, Ser: 0.7 mg/dL (ref 0.57–1.00)
Globulin, Total: 2.3 g/dL (ref 1.5–4.5)
Potassium: 3.5 mmol/L (ref 3.5–5.2)
Sodium: 137 mmol/L (ref 134–144)

## 2013-05-30 LAB — B12 AND FOLATE PANEL: Vitamin B-12: 387 pg/mL (ref 211–946)

## 2013-05-30 LAB — RPR: RPR: NONREACTIVE

## 2013-05-30 LAB — ANA W/REFLEX: Anti Nuclear Antibody(ANA): NEGATIVE

## 2013-05-30 LAB — RHEUMATOID FACTOR: Rhuematoid fact SerPl-aCnc: 8.4 IU/mL (ref 0.0–13.9)

## 2013-05-31 NOTE — Progress Notes (Signed)
Quick Note:  Please call and advise the patient that her labs came back normal with the exception of a significantly elevated blood glucose level of 326. This underlies the importance of good diabetes control. Otherwise her thyroid function,B12 level, rheumatoid factor, vitamin D level, and autoimmune marker were negative. No other action is required at this time on these tests. Star Age, MD, PhD [facility] ([facility]) ______

## 2013-06-01 ENCOUNTER — Telehealth: Payer: Self-pay | Admitting: Neurology

## 2013-06-02 ENCOUNTER — Telehealth: Payer: Self-pay | Admitting: *Deleted

## 2013-06-02 NOTE — Telephone Encounter (Signed)
erronous error  

## 2013-06-06 NOTE — Progress Notes (Signed)
Quick Note:  I called and spoke to pt and relayed the lab results. Importance of good blood sugar control. She is aware. Has appt tomorrow for NCS/EMG. ______

## 2013-06-07 ENCOUNTER — Ambulatory Visit (INDEPENDENT_AMBULATORY_CARE_PROVIDER_SITE_OTHER): Payer: Medicare Other | Admitting: Neurology

## 2013-06-07 ENCOUNTER — Encounter (INDEPENDENT_AMBULATORY_CARE_PROVIDER_SITE_OTHER): Payer: Medicare Other | Admitting: Radiology

## 2013-06-07 DIAGNOSIS — G5792 Unspecified mononeuropathy of left lower limb: Secondary | ICD-10-CM

## 2013-06-07 DIAGNOSIS — M79609 Pain in unspecified limb: Secondary | ICD-10-CM

## 2013-06-07 DIAGNOSIS — Z0289 Encounter for other administrative examinations: Secondary | ICD-10-CM

## 2013-06-07 DIAGNOSIS — G5790 Unspecified mononeuropathy of unspecified lower limb: Secondary | ICD-10-CM

## 2013-06-07 NOTE — Procedures (Signed)
  HISTORY:  Sara Graves is a 72 year old patient with a long-standing history of diabetes. In February 2014, she noted onset of left thigh discomfort and weakness in the left thigh. The patient has had a 100 pound weight loss since that time. She has a history of prior lumbosacral spine surgery, and she has a spinal stimulator in place. The patient is being evaluated for a possible neuropathy or a lumbosacral radiculopathy.  NERVE CONDUCTION STUDIES:  Nerve conduction studies were performed on the left upper extremity. The distal motor latencies for the left median and ulnar nerves were prolonged, with normal motor amplitudes for these nerves. The F wave latency for the left median nerve was normal, slightly prolonged for the left ulnar nerve. Slight slowing and nerve conduction velocities were noted for the left median and ulnar nerves. The sensory latencies for the left median, ulnar, and radial nerves were prolonged.  Nerve conduction studies were performed on both lower extremities. No response was seen for the right peroneal or posterior tibial nerves. The distal motor latencies for the left peroneal and posterior tibial nerves were normal, with low motor amplitude seen for these nerves. Slowing was seen for the left peroneal and posterior tibial nerves. The F wave latencies for the left peroneal and posterior tibial nerves were prolonged. The peroneal sensory latencies were absent bilaterally.  EMG STUDIES:  EMG study was performed on the left lower extremity:  The tibialis anterior muscle reveals 2 to 5K motor units with decreased recruitment. No fibrillations or positive waves were seen. The peroneus tertius muscle reveals 2 to 5K motor units with moderately decreased recruitment. No fibrillations or positive waves were seen. The medial gastrocnemius muscle reveals 1 to 3K motor units with decreased recruitment. 3+ fibrillations and positive waves were seen. The vastus lateralis muscle  reveals 2 to 4K motor units with full recruitment. No fibrillations or positive waves were seen. The iliopsoas muscle reveals 2 to 4K motor units with full recruitment. No fibrillations or positive waves were seen. The biceps femoris muscle (long head) reveals 2 to 3K motor units with full recruitment. No fibrillations or positive waves were seen. The lumbosacral paraspinal muscles were tested at 3 levels, and revealed no abnormalities of insertional activity at all 3 levels tested. There was poor relaxation.   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 EMG evaluation of the left lower extremity shows mild acute and chronic signs of denervation below the knee. No evidence of a lumbosacral radiculopathy is seen. A diabetic amyotrophy cannot be confirmed on this evaluation. EMG evaluation  indicates that the severity of the peripheral neuropathy is less severe than the nerve conduction study suggests.  Jill Alexanders MD 06/07/2013 1:44 PM  [facility] [address] [place], [place] [address]  Phone [phone] Fax [phone]

## 2013-06-07 NOTE — Progress Notes (Signed)
Quick Note:  Please call and advise the patient that the recent EMG and nerve conduction velocity test, which is the electrical nerve and muscle test we we performed, confirmed evidence of moderate to severe neuropathy i.e. Nerve damage.  Star Age, MD, PhD   ______

## 2013-06-09 NOTE — Progress Notes (Signed)
Quick Note:  I called pt and relayed he EMG/NCS results. She is taking the gabapentin 600mg  po daily. She states not noticing any difference. Will relay this to Dr. Rexene Alberts. She is to f/u with GI doctor and Dr. Salvatore Marvel with [facility]. If any change on our part will call her back. ______

## 2013-06-20 ENCOUNTER — Other Ambulatory Visit: Payer: Self-pay

## 2013-06-20 DIAGNOSIS — Z803 Family history of malignant neoplasm of breast: Secondary | ICD-10-CM

## 2013-06-20 DIAGNOSIS — Z1231 Encounter for screening mammogram for malignant neoplasm of breast: Secondary | ICD-10-CM

## 2013-07-01 ENCOUNTER — Encounter: Payer: Self-pay | Admitting: Neurology

## 2013-07-05 ENCOUNTER — Other Ambulatory Visit: Payer: Self-pay | Admitting: Gastroenterology

## 2013-07-07 ENCOUNTER — Ambulatory Visit
Admission: RE | Admit: 2013-07-07 | Discharge: 2013-07-07 | Disposition: A | Discharge status: Home / Self Care | Payer: Medicare Other | Source: Ambulatory Visit

## 2013-07-07 DIAGNOSIS — Z1231 Encounter for screening mammogram for malignant neoplasm of breast: Secondary | ICD-10-CM

## 2013-07-07 DIAGNOSIS — Z803 Family history of malignant neoplasm of breast: Secondary | ICD-10-CM

## 2013-07-14 ENCOUNTER — Other Ambulatory Visit (HOSPITAL_COMMUNITY): Payer: Self-pay | Admitting: Gastroenterology

## 2013-07-14 DIAGNOSIS — K5901 Slow transit constipation: Secondary | ICD-10-CM

## 2013-07-22 ENCOUNTER — Encounter (HOSPITAL_COMMUNITY)
Admission: RE | Admit: 2013-07-22 | Discharge: 2013-07-22 | Disposition: A | Discharge status: Home / Self Care | Payer: Medicare Other | Source: Ambulatory Visit | Attending: Gastroenterology | Admitting: Gastroenterology

## 2013-07-22 DIAGNOSIS — R634 Abnormal weight loss: Secondary | ICD-10-CM | POA: Insufficient documentation

## 2013-07-22 DIAGNOSIS — E119 Type 2 diabetes mellitus without complications: Secondary | ICD-10-CM | POA: Insufficient documentation

## 2013-07-22 DIAGNOSIS — K5901 Slow transit constipation: Secondary | ICD-10-CM

## 2013-07-22 MED ORDER — TECHNETIUM TC 99M SULFUR COLLOID
2.0000 | Freq: Once | INTRAVENOUS | Status: AC | PRN
  Administered 2013-07-22: 2 via INTRAVENOUS

## 2013-08-02 ENCOUNTER — Encounter: Payer: Self-pay | Admitting: Gastroenterology

## 2013-08-08 ENCOUNTER — Other Ambulatory Visit: Payer: Self-pay | Admitting: Gastroenterology

## 2013-08-08 DIAGNOSIS — R634 Abnormal weight loss: Secondary | ICD-10-CM

## 2013-08-15 ENCOUNTER — Encounter: Payer: Self-pay | Admitting: Gastroenterology

## 2013-08-17 ENCOUNTER — Ambulatory Visit
Admission: RE | Admit: 2013-08-17 | Discharge: 2013-08-17 | Disposition: A | Discharge status: Home / Self Care | Payer: Medicare Other | Source: Ambulatory Visit | Attending: Gastroenterology | Admitting: Gastroenterology

## 2013-08-17 DIAGNOSIS — R634 Abnormal weight loss: Secondary | ICD-10-CM

## 2013-08-18 ENCOUNTER — Other Ambulatory Visit: Payer: Self-pay | Admitting: Gastroenterology

## 2013-08-18 DIAGNOSIS — R109 Unspecified abdominal pain: Secondary | ICD-10-CM

## 2013-08-23 ENCOUNTER — Ambulatory Visit
Admission: RE | Admit: 2013-08-23 | Discharge: 2013-08-23 | Disposition: A | Discharge status: Home / Self Care | Payer: Medicare Other | Source: Ambulatory Visit | Attending: Gastroenterology | Admitting: Gastroenterology

## 2013-08-23 DIAGNOSIS — R109 Unspecified abdominal pain: Secondary | ICD-10-CM

## 2013-08-31 ENCOUNTER — Ambulatory Visit
Admission: RE | Admit: 2013-08-31 | Discharge: 2013-08-31 | Disposition: A | Discharge status: Home / Self Care | Payer: Medicare Other | Source: Ambulatory Visit | Attending: Gastroenterology | Admitting: Gastroenterology

## 2013-08-31 ENCOUNTER — Other Ambulatory Visit: Payer: Medicare Other

## 2013-08-31 ENCOUNTER — Other Ambulatory Visit: Payer: Self-pay | Admitting: Gastroenterology

## 2013-08-31 DIAGNOSIS — R109 Unspecified abdominal pain: Secondary | ICD-10-CM

## 2013-09-01 ENCOUNTER — Ambulatory Visit: Payer: Medicare Other | Admitting: Neurology

## 2013-09-06 ENCOUNTER — Ambulatory Visit
Admission: RE | Admit: 2013-09-06 | Discharge: 2013-09-06 | Disposition: A | Discharge status: Home / Self Care | Payer: Medicare Other | Source: Ambulatory Visit | Attending: Gastroenterology | Admitting: Gastroenterology

## 2013-09-06 ENCOUNTER — Other Ambulatory Visit: Payer: Self-pay | Admitting: Gastroenterology

## 2013-09-06 DIAGNOSIS — K5900 Constipation, unspecified: Secondary | ICD-10-CM

## 2013-09-15 ENCOUNTER — Other Ambulatory Visit: Payer: Self-pay | Admitting: Gastroenterology

## 2013-09-15 ENCOUNTER — Ambulatory Visit
Admission: RE | Admit: 2013-09-15 | Discharge: 2013-09-15 | Disposition: A | Discharge status: Home / Self Care | Payer: Medicare Other | Source: Ambulatory Visit | Attending: Gastroenterology | Admitting: Gastroenterology

## 2013-09-15 DIAGNOSIS — K5900 Constipation, unspecified: Secondary | ICD-10-CM

## 2013-09-21 ENCOUNTER — Ambulatory Visit
Admission: RE | Admit: 2013-09-21 | Discharge: 2013-09-21 | Disposition: A | Discharge status: Home / Self Care | Payer: Medicare Other | Source: Ambulatory Visit | Attending: Gastroenterology | Admitting: Gastroenterology

## 2013-09-21 MED ORDER — IOHEXOL 300 MG/ML  SOLN
100.0000 mL | Freq: Once | INTRAMUSCULAR | Status: AC | PRN
  Administered 2013-09-21: 100 mL via INTRAVENOUS

## 2014-03-24 ENCOUNTER — Other Ambulatory Visit: Payer: Self-pay | Admitting: Dermatology

## 2014-04-06 ENCOUNTER — Telehealth: Payer: Self-pay | Admitting: Family Medicine

## 2014-04-06 ENCOUNTER — Encounter: Payer: Self-pay | Admitting: Family Medicine

## 2014-04-06 ENCOUNTER — Ambulatory Visit (INDEPENDENT_AMBULATORY_CARE_PROVIDER_SITE_OTHER): Payer: Medicare Other | Admitting: Family Medicine

## 2014-04-06 VITALS — BP 139/50 | HR 71 | Temp 97.8°F | Ht 64.0 in | Wt 133.0 lb

## 2014-04-06 DIAGNOSIS — IMO0001 Reserved for inherently not codable concepts without codable children: Secondary | ICD-10-CM

## 2014-04-06 DIAGNOSIS — E1142 Type 2 diabetes mellitus with diabetic polyneuropathy: Secondary | ICD-10-CM

## 2014-04-06 DIAGNOSIS — IMO0002 Reserved for concepts with insufficient information to code with codable children: Secondary | ICD-10-CM

## 2014-04-06 DIAGNOSIS — E1165 Type 2 diabetes mellitus with hyperglycemia: Secondary | ICD-10-CM

## 2014-04-06 DIAGNOSIS — E1140 Type 2 diabetes mellitus with diabetic neuropathy, unspecified: Secondary | ICD-10-CM

## 2014-04-06 DIAGNOSIS — E2839 Other primary ovarian failure: Secondary | ICD-10-CM

## 2014-04-06 DIAGNOSIS — M949 Disorder of cartilage, unspecified: Secondary | ICD-10-CM

## 2014-04-06 DIAGNOSIS — E7800 Pure hypercholesterolemia, unspecified: Secondary | ICD-10-CM

## 2014-04-06 DIAGNOSIS — M899 Disorder of bone, unspecified: Secondary | ICD-10-CM

## 2014-04-06 DIAGNOSIS — I10 Essential (primary) hypertension: Secondary | ICD-10-CM

## 2014-04-06 DIAGNOSIS — M8580 Other specified disorders of bone density and structure, unspecified site: Secondary | ICD-10-CM | POA: Insufficient documentation

## 2014-04-06 DIAGNOSIS — E1149 Type 2 diabetes mellitus with other diabetic neurological complication: Secondary | ICD-10-CM

## 2014-04-06 LAB — COMPREHENSIVE METABOLIC PANEL
ALK PHOS: 55 U/L (ref 39–117)
ALT: 12 U/L (ref 0–35)
AST: 10 U/L (ref 0–37)
Albumin: 3.7 g/dL (ref 3.5–5.2)
BILIRUBIN TOTAL: 0.8 mg/dL (ref 0.2–1.2)
BUN: 20 mg/dL (ref 6–23)
CO2: 31 mEq/L (ref 19–32)
Calcium: 9.7 mg/dL (ref 8.4–10.5)
Chloride: 96 mEq/L (ref 96–112)
Creat: 0.62 mg/dL (ref 0.50–1.10)
GLUCOSE: 331 mg/dL — AB (ref 70–99)
Potassium: 3.5 mEq/L (ref 3.5–5.3)
SODIUM: 136 meq/L (ref 135–145)
TOTAL PROTEIN: 6.1 g/dL (ref 6.0–8.3)

## 2014-04-06 LAB — CBC WITH DIFFERENTIAL/PLATELET
BASOS ABS: 0 10*3/uL (ref 0.0–0.1)
BASOS PCT: 1 % (ref 0–1)
EOS ABS: 0.1 10*3/uL (ref 0.0–0.7)
Eosinophils Relative: 3 % (ref 0–5)
HCT: 36.7 % (ref 36.0–46.0)
Hemoglobin: 13 g/dL (ref 12.0–15.0)
Lymphocytes Relative: 48 % — ABNORMAL HIGH (ref 12–46)
Lymphs Abs: 2.3 10*3/uL (ref 0.7–4.0)
MCH: 32 pg (ref 26.0–34.0)
MCHC: 35.4 g/dL (ref 30.0–36.0)
MCV: 90.4 fL (ref 78.0–100.0)
Monocytes Absolute: 0.5 10*3/uL (ref 0.1–1.0)
Monocytes Relative: 10 % (ref 3–12)
Neutro Abs: 1.8 10*3/uL (ref 1.7–7.7)
Neutrophils Relative %: 38 % — ABNORMAL LOW (ref 43–77)
PLATELETS: 140 10*3/uL — AB (ref 150–400)
RBC: 4.06 MIL/uL (ref 3.87–5.11)
RDW: 13.1 % (ref 11.5–15.5)
WBC: 4.8 10*3/uL (ref 4.0–10.5)

## 2014-04-06 LAB — LIPID PANEL
CHOL/HDL RATIO: 4.1 ratio
Cholesterol: 197 mg/dL (ref 0–200)
HDL: 48 mg/dL (ref >39)
LDL CALC: 123 mg/dL — AB (ref 0–99)
Triglycerides: 129 mg/dL (ref <150)
VLDL: 26 mg/dL (ref 0–40)

## 2014-04-06 LAB — POCT GLYCOSYLATED HEMOGLOBIN (HGB A1C): Hemoglobin A1C: 10.5

## 2014-04-06 MED ORDER — ACCU-CHEK AVIVA PLUS W/DEVICE KIT: 1.0000 | PACK | Status: DC

## 2014-04-06 MED ORDER — PREGABALIN 50 MG PO CAPS: 50.0000 mg | ORAL_CAPSULE | Freq: Three times a day (TID) | ORAL | Status: DC

## 2014-04-06 MED ORDER — GABAPENTIN 300 MG PO CAPS: 300.0000 mg | ORAL_CAPSULE | Freq: Every evening | ORAL | Status: DC | PRN

## 2014-04-06 NOTE — Assessment & Plan Note (Signed)
Repeat DEXA, last one in 2010.

## 2014-04-06 NOTE — Assessment & Plan Note (Signed)
Lipid Profile today, continue Lipitor

## 2014-04-06 NOTE — Telephone Encounter (Signed)
Called in.  Thanks, Tamela Oddi. Awanda Mink, DO of [facility] 04/06/2014, 6:09 PM

## 2014-04-06 NOTE — Assessment & Plan Note (Signed)
Stable, continue home regimen.  Denies ADR, will check BMET and CBC today.

## 2014-04-06 NOTE — Assessment & Plan Note (Signed)
A1C today along with lipid profile.  Does not check her sugars cause she does not have a meter but on Lantus, SSI, and Byetta.  Will see back in one month and recommend fasting and at least checking her sugars prior to lunch or dinner.

## 2014-04-06 NOTE — Patient Instructions (Signed)
Sara Graves, please follow up with [place] in one month.  If you cannot afford the Lyrica, please let us know, and we can try the gabapentin again.  Thanks, Dr. Awanda Mink

## 2014-04-06 NOTE — Assessment & Plan Note (Signed)
Significant involvement, repeated Foot exam today and hypoesthesia in all toes c/w diabetic neuropathy.  Will try to start on Lyrica low dose and increase in a month if improvement.  Recommend daily foot checks, wearing diabetic shoes, and f/u with her podiatrist.  One small callous on her 3rd plantar distal phalanx, healing.

## 2014-04-06 NOTE — Progress Notes (Signed)
Sara Graves is a 73 y.o. female who presents today for DM II, basal cell CA of the L nare, HTN, HLD, Osteopenia, Diabetic Neuropathy.  Diabetic Neuropathy - has been ongoing now for awhile with paresthesias and numbness into her toes.  Has had extensive w/u performed by neurology which was unrevealing.  Has tried amitriptyline w/ minimal relief and neurotin which she stopped using 2/2 not having it refilled.  Having significant hypoesthesia in her distal toes and unsure of her last podiatrist visit.  Denies open lesions.  Osteopenia - Last DEXA in 2010 showing T score of -1.1.  Has not been taking Vit D, steroids, or anything else.  No pathologic fx.   DM II - On Lantus, SSI, and Byetta.  Does not check her sugars but denies hypoglycemic events.  Last A1C >14.    HTN - WEll controlled, continue current regimen w/o SE described.  HLD - On Lipitor, last Lipid Panel over 3 yrs ago.  Denies myalgias and compliant with medication.   Past Medical History  Diagnosis Date  . H. pylori infection 2008 and 1998    treated  . Multinodular goiter   . Hx of colonoscopy with polypectomy   . Depression   . Hyperlipidemia   . Hypertension   . Arthritis   . Urge urinary incontinence   . Diabetes mellitus, type II, insulin dependent   . Rotator cuff tear, left recurrent     History  Smoking status  . Never Smoker   Smokeless tobacco  . Never Used    Family History  Problem Relation Age of Onset  . Cancer Mother     breast  . Heart Problems Mother   . Muscular dystrophy Son   . Cancer Maternal Grandmother     breast  . Heart disease Maternal Grandfather   . Prostate cancer Father   . Breast cancer Sister     Current Outpatient Prescriptions on File Prior to Visit  Medication Sig Dispense Refill  . amitriptyline (ELAVIL) 10 MG tablet Take 1 tablet (10 mg total) by mouth at bedtime. Take 1-3 tabs qhs      . aspirin (BAYER CHILDRENS ASPIRIN) 81 MG chewable tablet Chew 81 mg by mouth  daily.       Marland Kitchen atorvastatin (LIPITOR) 20 MG tablet TAKE 1 TABLET AT BEDTIME  90 tablet  2  . Calcium Carbonate-Vitamin D (CALTRATE 600+D) 600-400 MG-UNIT per tablet Take 1 tablet by mouth 2 (two) times daily.       . Capsaicin 0.1 % CREA Apply 1 application topically 4 (four) times daily.  43 g  2  . exenatide (BYETTA 10 MCG PEN) 10 MCG/0.04ML SOLN Inject 0.04 mLs (10 mcg total) into the skin 2 (two) times daily.  1.2 mL  11  . HYDROcodone-acetaminophen (NORCO/VICODIN) 5-325 MG per tablet Take 1 tablet by mouth every 6 (six) hours as needed for pain.      Marland Kitchen insulin aspart (NOVOLOG) 100 UNIT/ML injection as directed. Inject 45 units prior to breakfast, 40 units prior to lunch and 40 units prior to evening meal.      . insulin glargine (LANTUS) 100 UNIT/ML injection Inject 60 Units into the skin 2 (two) times daily. Disp QS for 1 month  10 mL  11  . levothyroxine (SYNTHROID, LEVOTHROID) 50 MCG tablet Take 50 mcg by mouth every morning.       Marland Kitchen losartan-hydrochlorothiazide (HYZAAR) 100-25 MG per tablet TAKE 1 TABLET DAILY  90 tablet  2  .  Omega-3 Fatty Acids (FISH OIL) 1000 MG CAPS Take 1,000 mg by mouth 2 (two) times daily. OTC fish oil       No current facility-administered medications on file prior to visit.    ROS: Per HPI.  All other systems reviewed and are negative.   Physical Exam Filed Vitals:   04/06/14 0843  BP: 139/50  Pulse: 71  Temp: 97.8 F (36.6 C)    Physical Examination: General appearance - alert, well appearing, and in no distress Mental status - alert, oriented to person, place, and time Neck - supple, no significant adenopathy Chest - clear to auscultation, no wheezes, rales or rhonchi, symmetric air entry Heart - normal rate and regular rhythm, no murmurs noted Abdomen - soft, nontender, nondistended, no masses or organomegaly Skin - + ulceration L nare, healing    Chemistry      Component Value Date/Time   NA 137 05/26/2013 1455   NA 135 03/16/2013 1116   K  3.5 05/26/2013 1455   CL 96* 05/26/2013 1455   CO2 28 05/26/2013 1455   BUN 11 05/26/2013 1455   BUN 13 03/16/2013 1116   CREATININE 0.70 05/26/2013 1455   CREATININE 0.71 03/16/2013 1116      Component Value Date/Time   CALCIUM 9.8 05/26/2013 1455   ALKPHOS 53 05/26/2013 1455   AST 9 05/26/2013 1455   ALT 13 05/26/2013 1455   BILITOT 0.5 05/26/2013 1455      Lab Results  Component Value Date   WBC 6.0 03/16/2013   HGB 14.3 03/16/2013   HCT 41.8 03/16/2013   MCV 94.4 03/16/2013   PLT 162 03/16/2013   Lab Results  Component Value Date   TSH 2.160 05/26/2013   Lab Results  Component Value Date   HGBA1C >14.0 03/16/2013

## 2014-04-06 NOTE — Telephone Encounter (Signed)
Pt called to inform Dr. Awanda Mink that her insurance will not cover Lyrica and so she would like him to call in the gabapentin called in. jw

## 2014-04-07 ENCOUNTER — Other Ambulatory Visit: Payer: Self-pay | Admitting: Family Medicine

## 2014-04-07 ENCOUNTER — Other Ambulatory Visit: Payer: Self-pay

## 2014-04-07 ENCOUNTER — Telehealth: Payer: Self-pay | Admitting: Family Medicine

## 2014-04-07 DIAGNOSIS — E2839 Other primary ovarian failure: Secondary | ICD-10-CM

## 2014-04-07 DIAGNOSIS — M8580 Other specified disorders of bone density and structure, unspecified site: Secondary | ICD-10-CM

## 2014-04-07 MED ORDER — GLUCOSE BLOOD VI STRP: ORAL_STRIP | Status: DC

## 2014-04-07 NOTE — Telephone Encounter (Signed)
Patient informed.[place]

## 2014-04-07 NOTE — Telephone Encounter (Signed)
Completed.  Thanks.  Sara Oddi Awanda Mink, DO of [facility] 04/07/2014, 12:33 PM

## 2014-04-07 NOTE — Telephone Encounter (Signed)
Patients RX for test strips to go along with new glucometer.

## 2014-04-24 ENCOUNTER — Telehealth: Payer: Self-pay | Admitting: Family Medicine

## 2014-04-24 NOTE — Telephone Encounter (Signed)
Pt called and is having pain in her left breast. She would like to have a mammogram for this . jw

## 2014-04-25 ENCOUNTER — Ambulatory Visit
Admission: RE | Admit: 2014-04-25 | Discharge: 2014-04-25 | Disposition: A | Discharge status: Home / Self Care | Payer: Medicare Other | Source: Ambulatory Visit | Attending: Family Medicine | Admitting: Family Medicine

## 2014-04-25 DIAGNOSIS — M8580 Other specified disorders of bone density and structure, unspecified site: Secondary | ICD-10-CM

## 2014-04-25 DIAGNOSIS — E2839 Other primary ovarian failure: Secondary | ICD-10-CM

## 2014-04-25 NOTE — Telephone Encounter (Signed)
Please let pt know that she had a mammogram < 1 yr ago and her insurance will not cover this.  As well, her screening MM was negative for anything suspicious and if she continues to have pain, she should make an appointment for further evaluation.   Thanks, Tamela Oddi. Awanda Mink, DO of [facility] 04/25/2014, 8:45 AM

## 2014-04-25 NOTE — Telephone Encounter (Signed)
Spoke with patient and informed her of below 

## 2014-05-03 ENCOUNTER — Ambulatory Visit (INDEPENDENT_AMBULATORY_CARE_PROVIDER_SITE_OTHER): Payer: Medicare Other | Admitting: Family Medicine

## 2014-05-03 ENCOUNTER — Telehealth: Payer: Self-pay | Admitting: *Deleted

## 2014-05-03 VITALS — BP 139/83 | HR 68 | Temp 97.6°F | Ht 64.0 in | Wt 132.0 lb

## 2014-05-03 DIAGNOSIS — E1165 Type 2 diabetes mellitus with hyperglycemia: Secondary | ICD-10-CM

## 2014-05-03 DIAGNOSIS — R110 Nausea: Secondary | ICD-10-CM

## 2014-05-03 DIAGNOSIS — IMO0002 Reserved for concepts with insufficient information to code with codable children: Secondary | ICD-10-CM

## 2014-05-03 DIAGNOSIS — R112 Nausea with vomiting, unspecified: Secondary | ICD-10-CM

## 2014-05-03 DIAGNOSIS — IMO0001 Reserved for inherently not codable concepts without codable children: Secondary | ICD-10-CM

## 2014-05-03 LAB — GLUCOSE, CAPILLARY: Glucose-Capillary: 268 mg/dL — ABNORMAL HIGH (ref 70–99)

## 2014-05-03 MED ORDER — ONDANSETRON 4 MG PO TBDP: 4.0000 mg | ORAL_TABLET | Freq: Three times a day (TID) | ORAL | Status: DC | PRN

## 2014-05-03 MED ORDER — ONDANSETRON 4 MG PO TBDP
4.0000 mg | ORAL_TABLET | Freq: Once | ORAL | Status: AC
  Administered 2014-05-03: 4 mg via ORAL

## 2014-05-03 NOTE — Patient Instructions (Signed)
It has been a pleasure to see you today. Please take the medications as prescribed. If you develop abdominal pain, fever, chills or your nausea does not resolve with current treatment  you need to get re-evaluated

## 2014-05-03 NOTE — Progress Notes (Signed)
Family Medicine Office Visit Note   Subjective:   [name], female  DOB: 1941/09/15, 73 y.o.. MRN: [id number]   Pt that comes today for same day appointment complaining of nausea and vomiting for 4 days. She has history of DM insulin dependent and has not been injecting her insulin due to poor PO intake. She denies blood in vomiting, no diarrhea. Also denies abdominal pain, or history of sick contact or eating out.   Review of Systems:  Pt denies fever, chills, SOB, chest pain, palpitations, headaches, dizziness, numbness or weakness.  Objective:   Physical Exam: Gen:  NAD HEENT: Moist mucous membranes  CV: Regular rate and rhythm, no murmurs rubs or gallops PULM: Clear to auscultation bilaterally. No wheezes/rales/rhonchi ABD: Soft, non tender, non distended, normal bowel sounds EXT: No edema Neuro: Alert and oriented x3. No focalization  Assessment & Plan:

## 2014-05-03 NOTE — Telephone Encounter (Signed)
Pt called stating she has been vomiting since Friday.  It stopped Sunday and Monday, started again last night.  Pt can not hardly keep any fluids or solids down.  Per Dr. Awanda Mink pt 's need to be seen by provider today.  Appt scheduled for today 05/03/2014 at 3:45.  Derl Barrow, RN

## 2014-05-05 ENCOUNTER — Telehealth: Payer: Self-pay | Admitting: Family Medicine

## 2014-05-05 NOTE — Telephone Encounter (Signed)
Please call patient back to advise about increasing med for nausea.

## 2014-05-05 NOTE — Telephone Encounter (Signed)
Returned call to pt regarding nausea.  Pt requested to increase dosage.  Per verbal order by Dr. Awanda Mink; [facility]  For Zofran 8 mg every 6 hours as needed.  Derl Barrow, RN

## 2014-05-08 ENCOUNTER — Ambulatory Visit (INDEPENDENT_AMBULATORY_CARE_PROVIDER_SITE_OTHER): Payer: Medicare Other | Admitting: Family Medicine

## 2014-05-08 ENCOUNTER — Encounter: Payer: Self-pay | Admitting: Family Medicine

## 2014-05-08 VITALS — BP 129/59 | HR 76 | Temp 98.4°F | Ht 64.0 in | Wt 126.6 lb

## 2014-05-08 DIAGNOSIS — R110 Nausea: Secondary | ICD-10-CM

## 2014-05-08 DIAGNOSIS — IMO0002 Reserved for concepts with insufficient information to code with codable children: Secondary | ICD-10-CM

## 2014-05-08 DIAGNOSIS — E559 Vitamin D deficiency, unspecified: Secondary | ICD-10-CM

## 2014-05-08 DIAGNOSIS — M8580 Other specified disorders of bone density and structure, unspecified site: Secondary | ICD-10-CM

## 2014-05-08 DIAGNOSIS — M899 Disorder of bone, unspecified: Secondary | ICD-10-CM

## 2014-05-08 DIAGNOSIS — M949 Disorder of cartilage, unspecified: Secondary | ICD-10-CM

## 2014-05-08 DIAGNOSIS — E1165 Type 2 diabetes mellitus with hyperglycemia: Secondary | ICD-10-CM

## 2014-05-08 DIAGNOSIS — IMO0001 Reserved for inherently not codable concepts without codable children: Secondary | ICD-10-CM

## 2014-05-08 MED ORDER — PROMETHAZINE HCL 12.5 MG PO TABS: 12.5000 mg | ORAL_TABLET | Freq: Three times a day (TID) | ORAL | Status: DC | PRN

## 2014-05-08 NOTE — Assessment & Plan Note (Signed)
Ongoing now for over a week with intractable description, not responding to zofran.  Will switch to phenergan 12.5 mg q 8 hrs as needed for nausea.  Does have support at home with husband and described this may make her dizzy and to have her husband around to prevent her from falling.  She is amenable to this and understands risks. Possible causes other than transient gastritis would be iatrogenic gastroparesis 2/2 Byetta, diabetic gastroparesis as she has other S/Sx of longstanding DM.  Will decrease her Lantus to 30 U BID and to hold off on Novolog if not eating.  Denies any hypoglycemic events.  F/U in 1 week if no improvement.  Consider Reglan in future.

## 2014-05-08 NOTE — Assessment & Plan Note (Signed)
Would like to start with Vit D 800 IU and 1200 mg of CA.  No Bisphosphonate at this time, but may need it in near future.  Would repeat DEXA in two yrs.  Check Vit D level today, if <20, would consider switch to 2000 IU of Vit D.

## 2014-05-08 NOTE — Progress Notes (Signed)
Sara Graves is a 73 y.o. female who presents today for nausea, intractable, and DEXA f/u.  Nausea - Denies vomiting, denies abdominal pain or diarrhea or constipation.  Nausea not related to time of day and increased after trying to eat.  She has been able to eat some things but denies dysphagia or odynophagia.  Denies fever, chills, sweats.  No medications that have been changed recently and denies OTC meds like ibuprofen or NSAIDs.  Has tried zofran with minimal success.  Now ongoing for over a week and has never had this before.  Is unchanged in quality.    DM uncontrolled - Cut back on Lantus to 30 U BID due to decreased PO intake (previously on 60 U BID) and no taking Novolog at mealtime.  Denies hypoglycemic events and not checking sugars regularly.   Past Medical History  Diagnosis Date  . H. pylori infection 2008 and 1998    treated  . Multinodular goiter   . Hx of colonoscopy with polypectomy   . Depression   . Hyperlipidemia   . Hypertension   . Arthritis   . Urge urinary incontinence   . Diabetes mellitus, type II, insulin dependent   . Rotator cuff tear, left recurrent     History  Smoking status  . Never Smoker   Smokeless tobacco  . Never Used    Family History  Problem Relation Age of Onset  . Cancer Mother     breast  . Heart Problems Mother   . Muscular dystrophy Son   . Cancer Maternal Grandmother     breast  . Heart disease Maternal Grandfather   . Prostate cancer Father   . Breast cancer Sister     Current Outpatient Prescriptions on File Prior to Visit  Medication Sig Dispense Refill  . ACCU-CHEK ACTIVE STRIPS test strip USE AS DIRECTED  300 each  12  . amitriptyline (ELAVIL) 10 MG tablet Take 1 tablet (10 mg total) by mouth at bedtime. Take 1-3 tabs qhs      . aspirin (BAYER CHILDRENS ASPIRIN) 81 MG chewable tablet Chew 81 mg by mouth daily.       Marland Kitchen atorvastatin (LIPITOR) 20 MG tablet TAKE 1 TABLET AT BEDTIME  90 tablet  2  . Blood Glucose  Monitoring Suppl (ACCU-CHEK AVIVA PLUS) W/DEVICE KIT 1 kit by Does not apply route as directed.  1 kit  0  . Calcium Carbonate-Vitamin D (CALTRATE 600+D) 600-400 MG-UNIT per tablet Take 1 tablet by mouth 2 (two) times daily.       . Capsaicin 0.1 % CREA Apply 1 application topically 4 (four) times daily.  43 g  2  . exenatide (BYETTA 10 MCG PEN) 10 MCG/0.04ML SOLN Inject 0.04 mLs (10 mcg total) into the skin 2 (two) times daily.  1.2 mL  11  . gabapentin (NEURONTIN) 300 MG capsule Take 1 capsule (300 mg total) by mouth at bedtime as needed.  90 capsule  0  . insulin aspart (NOVOLOG) 100 UNIT/ML injection as directed. Inject 45 units prior to breakfast, 40 units prior to lunch and 40 units prior to evening meal.      . insulin glargine (LANTUS) 100 UNIT/ML injection Inject 60 Units into the skin 2 (two) times daily. Disp QS for 1 month  10 mL  11  . levothyroxine (SYNTHROID, LEVOTHROID) 50 MCG tablet Take 50 mcg by mouth every morning.       Marland Kitchen losartan-hydrochlorothiazide (HYZAAR) 100-25 MG per tablet TAKE 1  TABLET DAILY  90 tablet  2  . Omega-3 Fatty Acids (FISH OIL) 1000 MG CAPS Take 1,000 mg by mouth 2 (two) times daily. OTC fish oil      . ondansetron (ZOFRAN ODT) 4 MG disintegrating tablet Take 1 tablet (4 mg total) by mouth every 8 (eight) hours as needed for nausea or vomiting.  20 tablet  0   No current facility-administered medications on file prior to visit.    ROS: Per HPI.  All other systems reviewed and are negative.   Physical Exam Filed Vitals:   05/08/14 0841  BP: 129/59  Pulse: 76  Temp: 98.4 F (36.9 C)    Physical Examination: General appearance - alert, well appearing, and in no distress Mouth - mucous membranes moist, pharynx normal without lesions Neck - supple, no significant adenopathy Chest - clear to auscultation, no wheezes, rales or rhonchi, symmetric air entry Heart - normal rate and regular rhythm, no murmurs noted Abdomen - soft, nontender, nondistended,  no masses or organomegaly    Chemistry      Component Value Date/Time   NA 136 04/06/2014 0933   NA 137 05/26/2013 1455   K 3.5 04/06/2014 0933   CL 96 04/06/2014 0933   CO2 31 04/06/2014 0933   BUN 20 04/06/2014 0933   BUN 11 05/26/2013 1455   CREATININE 0.62 04/06/2014 0933   CREATININE 0.70 05/26/2013 1455      Component Value Date/Time   CALCIUM 9.7 04/06/2014 0933   ALKPHOS 55 04/06/2014 0933   AST 10 04/06/2014 0933   ALT 12 04/06/2014 0933   BILITOT 0.8 04/06/2014 0933      Lab Results  Component Value Date   HGBA1C 10.5 04/06/2014

## 2014-05-08 NOTE — Assessment & Plan Note (Signed)
Continue checking fasting sugars and if having Sx while decrease PO intake. Denies dizziness, lightheadedness, blurred vision, or nightmares.

## 2014-05-08 NOTE — Patient Instructions (Signed)
Sara Graves, It was nice seeing you today.  We will check your vitamin D, please start taking 800 IU of Vitamin D and 1200 mg of Calcium.  As well, we will try phenergan 12.5 mg every 8 hours as needed for nausea.  You may increase this to 25 mg if it is not working for you.  Please take this while sitting down and have your husband help you if you feel lightheaded.  Continue using 30 U of Lantus twice daily and hold the novolog if not eating well.  If this does not improve by the end of the week, please make a follow up appointment.  Thanks, Dr. Awanda Mink

## 2014-05-09 ENCOUNTER — Other Ambulatory Visit: Payer: Self-pay | Admitting: Family Medicine

## 2014-05-09 DIAGNOSIS — E559 Vitamin D deficiency, unspecified: Secondary | ICD-10-CM | POA: Insufficient documentation

## 2014-05-09 LAB — VITAMIN D 25 HYDROXY (VIT D DEFICIENCY, FRACTURES): VIT D 25 HYDROXY: 12 ng/mL — AB (ref 30–89)

## 2014-05-09 MED ORDER — VITAMIN D (ERGOCALCIFEROL) 1.25 MG (50000 UNIT) PO CAPS: 50000.0000 [IU] | ORAL_CAPSULE | ORAL | Status: DC

## 2014-05-09 NOTE — Assessment & Plan Note (Signed)
Recent history of nausea alone. CBG is 268 today but pt has recent A1C in 10.5.  Even though she reports poor PO intake and vomiting her volume status is euvolemic clinically. Concerns for some degree of gastroparesis as complication of her uncontrolled DM. Will treat acutely with zofran to encourage food intake and close f/u

## 2014-05-09 NOTE — Assessment & Plan Note (Signed)
Vit D level 12 on check.  Will go ahead with Vit D 50,000 IU q week x 8 weeks, then switch to 2000 IU daily and recheck Vit D level in 3-4 months.  Discussed with pt on the phone, no questions.

## 2014-05-09 NOTE — Addendum Note (Signed)
Addended by: Nolon Rod on: 05/09/2014 10:37 AM   Modules accepted: Orders, Medications

## 2014-05-15 ENCOUNTER — Inpatient Hospital Stay (HOSPITAL_COMMUNITY)
Admission: EM | Admit: 2014-05-15 | Discharge: 2014-05-17 | DRG: 640 | Disposition: A | Discharge status: Home / Self Care | Payer: Medicare Other | Attending: Family Medicine | Admitting: Family Medicine

## 2014-05-15 ENCOUNTER — Encounter (HOSPITAL_COMMUNITY): Payer: Self-pay | Admitting: Emergency Medicine

## 2014-05-15 ENCOUNTER — Inpatient Hospital Stay (HOSPITAL_COMMUNITY): Payer: Medicare Other

## 2014-05-15 DIAGNOSIS — E039 Hypothyroidism, unspecified: Secondary | ICD-10-CM | POA: Diagnosis present

## 2014-05-15 DIAGNOSIS — R110 Nausea: Secondary | ICD-10-CM

## 2014-05-15 DIAGNOSIS — E7800 Pure hypercholesterolemia, unspecified: Secondary | ICD-10-CM | POA: Diagnosis present

## 2014-05-15 DIAGNOSIS — E559 Vitamin D deficiency, unspecified: Secondary | ICD-10-CM

## 2014-05-15 DIAGNOSIS — R739 Hyperglycemia, unspecified: Secondary | ICD-10-CM

## 2014-05-15 DIAGNOSIS — R634 Abnormal weight loss: Secondary | ICD-10-CM

## 2014-05-15 DIAGNOSIS — Z66 Do not resuscitate: Secondary | ICD-10-CM | POA: Diagnosis present

## 2014-05-15 DIAGNOSIS — K3184 Gastroparesis: Secondary | ICD-10-CM | POA: Diagnosis present

## 2014-05-15 DIAGNOSIS — Z803 Family history of malignant neoplasm of breast: Secondary | ICD-10-CM

## 2014-05-15 DIAGNOSIS — E43 Unspecified severe protein-calorie malnutrition: Secondary | ICD-10-CM

## 2014-05-15 DIAGNOSIS — C50919 Malignant neoplasm of unspecified site of unspecified female breast: Secondary | ICD-10-CM | POA: Diagnosis present

## 2014-05-15 DIAGNOSIS — Z79899 Other long term (current) drug therapy: Secondary | ICD-10-CM

## 2014-05-15 DIAGNOSIS — Z96659 Presence of unspecified artificial knee joint: Secondary | ICD-10-CM

## 2014-05-15 DIAGNOSIS — F3289 Other specified depressive episodes: Secondary | ICD-10-CM | POA: Diagnosis present

## 2014-05-15 DIAGNOSIS — IMO0002 Reserved for concepts with insufficient information to code with codable children: Secondary | ICD-10-CM

## 2014-05-15 DIAGNOSIS — E874 Mixed disorder of acid-base balance: Secondary | ICD-10-CM | POA: Diagnosis present

## 2014-05-15 DIAGNOSIS — I10 Essential (primary) hypertension: Secondary | ICD-10-CM

## 2014-05-15 DIAGNOSIS — E785 Hyperlipidemia, unspecified: Secondary | ICD-10-CM | POA: Diagnosis present

## 2014-05-15 DIAGNOSIS — M899 Disorder of bone, unspecified: Secondary | ICD-10-CM | POA: Diagnosis present

## 2014-05-15 DIAGNOSIS — E876 Hypokalemia: Principal | ICD-10-CM

## 2014-05-15 DIAGNOSIS — E875 Hyperkalemia: Secondary | ICD-10-CM | POA: Diagnosis present

## 2014-05-15 DIAGNOSIS — Z7982 Long term (current) use of aspirin: Secondary | ICD-10-CM

## 2014-05-15 DIAGNOSIS — Z794 Long term (current) use of insulin: Secondary | ICD-10-CM

## 2014-05-15 DIAGNOSIS — Z8249 Family history of ischemic heart disease and other diseases of the circulatory system: Secondary | ICD-10-CM

## 2014-05-15 DIAGNOSIS — I872 Venous insufficiency (chronic) (peripheral): Secondary | ICD-10-CM

## 2014-05-15 DIAGNOSIS — M949 Disorder of cartilage, unspecified: Secondary | ICD-10-CM

## 2014-05-15 DIAGNOSIS — E1165 Type 2 diabetes mellitus with hyperglycemia: Secondary | ICD-10-CM

## 2014-05-15 DIAGNOSIS — M1710 Unilateral primary osteoarthritis, unspecified knee: Secondary | ICD-10-CM | POA: Diagnosis present

## 2014-05-15 DIAGNOSIS — F329 Major depressive disorder, single episode, unspecified: Secondary | ICD-10-CM | POA: Diagnosis present

## 2014-05-15 DIAGNOSIS — E119 Type 2 diabetes mellitus without complications: Secondary | ICD-10-CM | POA: Diagnosis present

## 2014-05-15 DIAGNOSIS — K5900 Constipation, unspecified: Secondary | ICD-10-CM | POA: Diagnosis present

## 2014-05-15 LAB — CBC WITH DIFFERENTIAL/PLATELET
Basophils Absolute: 0 10*3/uL (ref 0.0–0.1)
Basophils Relative: 1 % (ref 0–1)
EOS ABS: 0 10*3/uL (ref 0.0–0.7)
Eosinophils Relative: 1 % (ref 0–5)
HCT: 40.2 % (ref 36.0–46.0)
HEMOGLOBIN: 14.7 g/dL (ref 12.0–15.0)
Lymphocytes Relative: 33 % (ref 12–46)
Lymphs Abs: 1.8 10*3/uL (ref 0.7–4.0)
MCH: 32.2 pg (ref 26.0–34.0)
MCHC: 37.8 g/dL — AB (ref 30.0–36.0)
MCV: 86.1 fL (ref 78.0–100.0)
MONOS PCT: 8 % (ref 3–12)
Monocytes Absolute: 0.5 10*3/uL (ref 0.1–1.0)
NEUTROS PCT: 58 % (ref 43–77)
Neutro Abs: 3.2 10*3/uL (ref 1.7–7.7)
Platelets: 142 10*3/uL — ABNORMAL LOW (ref 150–400)
RBC: 4.67 MIL/uL (ref 3.87–5.11)
RDW: 11.9 % (ref 11.5–15.5)
WBC: 5.5 10*3/uL (ref 4.0–10.5)

## 2014-05-15 LAB — I-STAT CHEM 8, ED
BUN: 18 mg/dL (ref 6–23)
Calcium, Ion: 1.03 mmol/L — ABNORMAL LOW (ref 1.13–1.30)
Chloride: 91 mEq/L — ABNORMAL LOW (ref 96–112)
Creatinine, Ser: 0.6 mg/dL (ref 0.50–1.10)
GLUCOSE: 338 mg/dL — AB (ref 70–99)
HCT: 43 % (ref 36.0–46.0)
Hemoglobin: 14.6 g/dL (ref 12.0–15.0)
Potassium: 2.8 mEq/L — CL (ref 3.7–5.3)
Sodium: 134 mEq/L — ABNORMAL LOW (ref 137–147)
TCO2: 31 mmol/L (ref 0–100)

## 2014-05-15 LAB — I-STAT VENOUS BLOOD GAS, ED
Acid-Base Excess: 13 mmol/L — ABNORMAL HIGH (ref 0.0–2.0)
BICARBONATE: 34.7 meq/L — AB (ref 20.0–24.0)
O2 Saturation: 50 %
PH VEN: 7.627 — AB (ref 7.250–7.300)
TCO2: 36 mmol/L (ref 0–100)
pCO2, Ven: 33.2 mmHg — ABNORMAL LOW (ref 45.0–50.0)
pO2, Ven: 22 mmHg — CL (ref 30.0–45.0)

## 2014-05-15 LAB — CBG MONITORING, ED: Glucose-Capillary: 373 mg/dL — ABNORMAL HIGH (ref 70–99)

## 2014-05-15 MED ORDER — POTASSIUM CHLORIDE 10 MEQ/100ML IV SOLN
10.0000 meq | INTRAVENOUS | Status: AC
  Administered 2014-05-15 – 2014-05-16 (×4): 10 meq via INTRAVENOUS
  Filled 2014-05-15 (×4): qty 100

## 2014-05-15 MED ORDER — DIPHENHYDRAMINE HCL 50 MG/ML IJ SOLN
12.5000 mg | Freq: Once | INTRAMUSCULAR | Status: AC
  Administered 2014-05-15: 12.5 mg via INTRAVENOUS
  Filled 2014-05-15: qty 1

## 2014-05-15 MED ORDER — METOCLOPRAMIDE HCL 5 MG/ML IJ SOLN
10.0000 mg | Freq: Once | INTRAMUSCULAR | Status: AC
Start: 2014-05-15 — End: 2014-05-15
  Administered 2014-05-15: 10 mg via INTRAVENOUS
  Filled 2014-05-15: qty 2

## 2014-05-15 MED ORDER — SODIUM CHLORIDE 0.9 % IV BOLUS (SEPSIS)
250.0000 mL | Freq: Once | INTRAVENOUS | Status: AC
  Administered 2014-05-15: 250 mL via INTRAVENOUS

## 2014-05-15 MED ORDER — HYDROCHLOROTHIAZIDE 25 MG PO TABS
25.0000 mg | ORAL_TABLET | Freq: Every day | ORAL | Status: DC
  Administered 2014-05-15 – 2014-05-16 (×2): 25 mg via ORAL
  Filled 2014-05-15 (×3): qty 1

## 2014-05-15 MED ORDER — LOSARTAN POTASSIUM 50 MG PO TABS
100.0000 mg | ORAL_TABLET | Freq: Every day | ORAL | Status: DC
  Administered 2014-05-15 – 2014-05-16 (×2): 100 mg via ORAL
  Filled 2014-05-15 (×5): qty 2

## 2014-05-15 MED ORDER — POTASSIUM CHLORIDE CRYS ER 20 MEQ PO TBCR
40.0000 meq | EXTENDED_RELEASE_TABLET | Freq: Once | ORAL | Status: AC
  Administered 2014-05-15: 40 meq via ORAL

## 2014-05-15 MED ORDER — LABETALOL HCL 5 MG/ML IV SOLN
20.0000 mg | Freq: Once | INTRAVENOUS | Status: AC
  Administered 2014-05-15: 20 mg via INTRAVENOUS
  Filled 2014-05-15: qty 4

## 2014-05-15 MED ORDER — ONDANSETRON HCL 4 MG/2ML IJ SOLN: 4.0000 mg | Freq: Once | INTRAMUSCULAR | Status: DC

## 2014-05-15 MED ORDER — LOSARTAN POTASSIUM-HCTZ 100-25 MG PO TABS: 1.0000 | ORAL_TABLET | Freq: Every day | ORAL | Status: DC

## 2014-05-15 NOTE — ED Provider Notes (Signed)
CSN: [id number]     Arrival date & time 05/15/14  1706 History   First MD Initiated Contact with Patient 05/15/14 1949     Chief Complaint  Patient presents with  . N/V      (Consider location/radiation/quality/duration/timing/severity/associated sxs/prior Treatment) HPI Comments: 3 weeks of persistent nausea and vomiting despite use of Zofran or Phenergan  Denies fever, diarrhea, constipation.  States has not bee checking her blood sugars as she has felt too bad.  Was seen by PCP 05/08/14 and changed to Phenergan, called today and was told in increase dose--to 25 mg but still having symptoms   The history is provided by the patient.    Past Medical History  Diagnosis Date  . H. pylori infection 2008 and 1998    treated  . Multinodular goiter   . Hx of colonoscopy with polypectomy   . Depression   . Hyperlipidemia   . Hypertension   . Arthritis   . Urge urinary incontinence   . Diabetes mellitus, type II, insulin dependent   . Rotator cuff tear, left recurrent    Past Surgical History  Procedure Laterality Date  . 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 Lipoma excision      RIGHT ELBOW  . Excision ganglion cyst left ring finger  01-17-2009  . Implantation permanent spinal cord stimulator  06-15-2008    JUNE 2013--  BATTERY CHANGE  . Foraminal decompression at l2 to the sacrum  01-05-2008    L2  -  S1  . Rotator cuff repair  03-28-2003    RIGHT SHOULDER  DEGENERATIVE AC JOINT AND RC TEAR  . Bladder neck suspension  1970'S  . Bilateral bunionectomies    . Hammertoe repair      BILATERAL  . Vaginal hysterectomy  1970'S    W/ BILATERAL SALPINGO-OOPHORECTOMY  . Appendectomy  AGE 71  . Tonsillectomy  AGE 87  . Shoulder open rotator cuff repair  09/07/2012    Procedure: ROTATOR CUFF REPAIR SHOULDER OPEN;  Surgeon: Magnus Sinning, MD;  Location: [place];  Service: Orthopedics;  Laterality: Left;  OPEN ANTERIOR ACROMIONECTOMY AND ROTATOR  CUFF REPAIR ON LEFT   . Shoulder open rotator cuff repair Left 12/28/2012    Procedure: ROTATOR CUFF REPAIR SHOULDER OPEN;  Surgeon: Magnus Sinning, MD;  Location: [place];  Service: Orthopedics;  Laterality: Left;  OPEN SHOULDER ROTATOR CUFF REPAIR ON LEFT  WITH ANTERIOR ACROMINECTOMY ANESTHESIA: GENERAL/SCALENE NERVE BLOCK   Family History  Problem Relation Age of Onset  . Cancer Mother     breast  . Heart Problems Mother   . Muscular dystrophy Son   . Cancer Maternal Grandmother     breast  . Heart disease Maternal Grandfather   . Prostate cancer Father   . Breast cancer Sister    History  Substance Use Topics  . Smoking status: Never Smoker   . Smokeless tobacco: Never Used  . Alcohol Use: No   OB History   Grav Para Term Preterm Abortions TAB SAB Ect Mult Living                 Review of Systems  Constitutional: Negative for fever and chills.  Respiratory: Negative for shortness of breath.   Cardiovascular: Negative for chest pain.  Gastrointestinal: Positive for nausea and vomiting. Negative for diarrhea and constipation.  Genitourinary: Negative for dysuria.  Skin: Negative for rash and wound.  Neurological: Negative  for dizziness, weakness and headaches.  All other systems reviewed and are negative.     Allergies  Morphine; Rosiglitazone maleate; and Tramadol-acetaminophen  Home Medications   Prior to Admission medications   Medication Sig Start Date End Date Taking? Authorizing Provider  amitriptyline (ELAVIL) 10 MG tablet Take 10 mg by mouth at bedtime.   Yes Historical Provider, MD  aspirin (BAYER CHILDRENS ASPIRIN) 81 MG chewable tablet Chew 81 mg by mouth daily.    Yes Historical Provider, MD  atorvastatin (LIPITOR) 20 MG tablet Take 20 mg by mouth at bedtime.   Yes Historical Provider, MD  Blood Glucose Monitoring Suppl (ACCU-CHEK AVIVA PLUS) W/DEVICE KIT 1 kit by Does not apply route as directed. 04/06/14  Yes Bryan R Hess, DO   exenatide (BYETTA 10 MCG PEN) 10 MCG/0.04ML SOLN Inject 0.04 mLs (10 mcg total) into the skin 2 (two) times daily. 08/11/12  Yes Clovis Cao, MD  gabapentin (NEURONTIN) 300 MG capsule Take 1 capsule (300 mg total) by mouth at bedtime as needed. 04/06/14  Yes Bryan R Hess, DO  Glucose Blood (ACCU-CHEK ACTIVE VI) 2-3 each by In Vitro route See admin instructions. Check blood sugar 2-3 times daily.   Yes Historical Provider, MD  insulin aspart (NOVOLOG) 100 UNIT/ML injection Inject 40-45 Units into the skin See admin instructions. Inject 45 units subq before breakfast, 40 units subq before lunch and 40 units subq before dinner.   Yes Historical Provider, MD  insulin glargine (LANTUS) 100 UNIT/ML injection Inject 60 Units into the skin 2 (two) times daily. Disp QS for 1 month 08/11/12  Yes Clovis Cao, MD  levothyroxine (SYNTHROID, LEVOTHROID) 50 MCG tablet Take 50 mcg by mouth every morning.  08/24/12  Yes Clovis Cao, MD  losartan-hydrochlorothiazide (HYZAAR) 100-25 MG per tablet Take 1 tablet by mouth daily.   Yes Historical Provider, MD  Omega-3 Fatty Acids (FISH OIL) 1000 MG CAPS Take 1,000 mg by mouth 2 (two) times daily. OTC fish oil   Yes Historical Provider, MD  ondansetron (ZOFRAN ODT) 4 MG disintegrating tablet Take 1 tablet (4 mg total) by mouth every 8 (eight) hours as needed for nausea or vomiting. 05/03/14  Yes Dayarmys Piloto de Gwendalyn Ege, MD  promethazine (PHENERGAN) 12.5 MG tablet Take 1 tablet (12.5 mg total) by mouth every 8 (eight) hours as needed for nausea or vomiting. 05/08/14  Yes Nolon Rod, DO  Vitamin D, Ergocalciferol, (DRISDOL) 50000 UNITS CAPS capsule Take 50,000 Units by mouth every 7 (seven) days.   Yes Historical Provider, MD   BP 205/74  Pulse 68  Temp(Src) 97.2 F (36.2 C) (Oral)  Resp 21  Ht 5' 3"  (1.6 m)  Wt 130 lb (58.968 kg)  BMI 23.03 kg/m2  SpO2 100% Physical Exam  Nursing note and vitals reviewed. Constitutional: She is oriented to person, place, and  time. She appears well-developed and well-nourished. No distress.  HENT:  Head: Normocephalic.  Mouth/Throat: Oropharynx is clear and moist.  Eyes: Pupils are equal, round, and reactive to light.  Neck: Normal range of motion.  Cardiovascular: Normal rate and regular rhythm.   Pulmonary/Chest: Effort normal and breath sounds normal.  Abdominal: Soft. Bowel sounds are normal. She exhibits no distension. There is no tenderness.  Musculoskeletal: Normal range of motion. She exhibits no edema.  Neurological: She is alert and oriented to person, place, and time.  Skin: Skin is warm. No rash noted. There is pallor.    ED Course  Procedures (including critical  care time) Labs Review Labs Reviewed  CBC WITH DIFFERENTIAL - Abnormal; Notable for the following:    MCHC 37.8 (*)    Platelets 142 (*)    All other components within normal limits  CBG MONITORING, ED - Abnormal; Notable for the following:    Glucose-Capillary 373 (*)    All other components within normal limits  I-STAT CHEM 8, ED - Abnormal; Notable for the following:    Sodium 134 (*)    Potassium 2.8 (*)    Chloride 91 (*)    Glucose, Bld 338 (*)    Calcium, Ion 1.03 (*)    All other components within normal limits  I-STAT VENOUS BLOOD GAS, ED - Abnormal; Notable for the following:    pH, Ven 7.627 (*)    pCO2, Ven 33.2 (*)    pO2, Ven 22.0 (*)    Bicarbonate 34.7 (*)    Acid-Base Excess 13.0 (*)    All other components within normal limits  BLOOD GAS, VENOUS    Imaging Review No results found.   EKG Interpretation None      MDM  Anion Gap 8.3  Potasium of 2.8 will be supplemented with 40 Meq PO and 40 Meq IV Blood pressure is elevated will give normal Hyzaar as well as Ablatio 20 mg IV for HTN control  Patient also had an unexplained drop in oxygen saturation to 60 %,  with good wave form, requiring oxygen supplementation  Family Practice will admit  Final diagnoses:  Hypokalemia  Hyperglycemia  Essential  hypertension         Garald Balding, NP 05/15/14 2145

## 2014-05-15 NOTE — Progress Notes (Signed)
Report received from Matt,RN

## 2014-05-15 NOTE — ED Notes (Addendum)
Pt in c/o n/v over the last three weeks, denies pain, states she has been having trouble eating for the last year and has lost over 100lbs in that time, went to PCP and was given medication for nausea without relief. Pt is a diabetic who is insulin dependent, states she hasn't checked her glucose or taken her insulin in two weeks. CBG obtained in triage.

## 2014-05-15 NOTE — ED Notes (Signed)
Venous blood gas and i-stat chem 8 result given to Dr. Betsey Holiday

## 2014-05-15 NOTE — ED Notes (Addendum)
NP Sara Graves notified that pt had an episode where her SpO2 went to the mid 60's. Pt denied distress. Pt placed on 2L [place], SpO2 now 100%

## 2014-05-15 NOTE — H&P (Signed)
[facility] Admission History and Physical Service Pager: [phone]  Patient name: Sara Graves Medical record number: [id number] Date of birth: 11/17/1941 Age: 73 y.o. Gender: female  Primary Care Haydan Mansouri: Kennith Maes, DO Consultants: None Code Status: DNR/uncertain re: intubation, per discussion with patient  Chief Complaint: Nausea and vomiting  Assessment and Plan: Sara Graves is a 73 y.o. female presenting with nausea and vomiting. PMH is significant for Diabetes mellitus, hypertension  # Nausea and vomiting: Patient has had long course of outpatient treatment. Much better s/p reglan in ED. Patient has history of uncontrolled diabetes. Symptoms could be from gastroparesis vs gastroenteritis vs medication side effect. Patient has been afebrile with no white count. No diarrhea. - admit to [facility] inpatient, attending physician Dr. Mingo Amber - reglan 15m IV q6 hrs scheduled - transition to reglan PO in AM - zofran q6 hrs PRN - f/u lipase - f/u Cmet and CBC in AM - f/u EKG  # Hypokalemic hypochloremic metabolic alkalosis: most likely a direct result of frequent vomiting - manage as in above problem  # Hypokalemia: recent baseline potassium of 3.2-3.4. Currently 2.8. S/p K-dur 432m in and s I and in all is a and K [id number]. Most likely due to very poor by mouth intake and emesis. - monitor bmet  # Diabetes mellitus, type 2: Poorly controlled as an outpatient with most recent A1c 04/06/2014 at 10.5, and currently hyperglycemic to 343. Takes Lantus and Novolog outpatient. - Lantus 20u daily (decreased from 60 units twice a day, which patient has not taken in many days)  - SSI, moderate  # Constipation: no bowel movement since two days ago (Saturday 6/27). Most likely because of poor PO intake. - Miralax daily  # Hypothyroidism: currently receiving treatment - continue levothyroxine  # ?oxygen requirement: s/p desat to 60s in ED. Currently stable  on room air. Chest x-ray not suggestive of pulmonary disease. Unclear duration or etiology. - vitals per unit and monitor for recurrence  # weight loss - per patient, 120 lbs over the past year. Possibly related to uncontrolled diabetes. Also always must consider malignancy. Mammogram 06/2013 normal, colonoscopy done 04/2010 but unable to find result. FH breast cancer and prostate cancer. - Chronic issue but needs outpatient workup.  FEN/GI: clear liquid diet, 1/2NS @ 100 Prophylaxis: Heparin subq  Disposition: Admit to inpatient, med-surg  History of Present Illness: Sara Graves a 73 46o. female presenting with intractable nausea and vomiting  She has been nauseated 3 weeks and had been trying zofran/phenergan outpatient which were not helping, so she called PCP who recommended she come to ED for eval. In the ED, she took reglan which helped though she has continued feeling nauseated. Symptoms occurred throughout the days whenever she tried to drink liquids. Vomit was clear. It was not associated with any time of day or food. She has not had fevers, sick contacts, new foods or unusual foods, and no diarrhea. She has not had a bowel movement in 3-4 days. She is urinating normally. She has not had similar symptoms in the past. She denies dyspnea or chest pain. She has had mild abdominal pain. Denies lightheadedness/dizziness. Only complaint has been nausea/vomiting, with no tachycardia/palpitations. She has not been taking her medication (including injectable insulin) because of nausea.  In the ED, she had an episode of oxygen desaturation to 60% on room air which resolved to 100% on 2L O2, with unclear amt of time in 60s. In the hospital  room, she is now satting 100% on room air. She also got benadryl, labetalol, reglan, kdur 40, 53mq x 4, and a 250 cc normal saline bolus. Abdominal pain and nausea have greatly improved.  Review Of Systems: Per HPI with the following additions: None Otherwise  12 point review of systems was performed and was unremarkable.  Patient Active Problem List   Diagnosis Date Noted  . Hyperkalemia 05/15/2014  . Unspecified vitamin D deficiency 05/09/2014  . Nausea alone 05/08/2014  . Osteopenia 04/06/2014  . Neuropathy of leg 04/27/2013  . Weight loss, abnormal 03/16/2013  . Left rotator cuff tear 12/29/2012  . Baker's cyst 04/09/2012  . Diabetic neuropathy 09/16/2011  . UNSPECIFIED VENOUS INSUFFICIENCY 09/20/2010  . LACTOSE INTOLERANCE 01/23/2009  . DISORDER, DEPRESSIVE NEC 02/04/2007  . HYPOTHYROIDISM, UNSPECIFIED 01/14/2007  . DM (diabetes mellitus), type 2, uncontrolled 01/14/2007  . HYPERCHOLESTEROLEMIA 01/14/2007  . HYPERTENSION, BENIGN SYSTEMIC 01/14/2007  . RHINITIS, ALLERGIC 01/14/2007  . ARTHRITIS 01/14/2007  . SCIATICA 01/14/2007   Past Medical History: Past Medical History  Diagnosis Date  . H. pylori infection 2008 and 1998    treated  . Multinodular goiter   . Hx of colonoscopy with polypectomy   . Depression   . Hyperlipidemia   . Hypertension   . Arthritis   . Urge urinary incontinence   . Diabetes mellitus, type II, insulin dependent   . Rotator cuff tear, left recurrent    Past Surgical History: Past Surgical History  Procedure Laterality Date  . 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 Lipoma excision      RIGHT ELBOW  . Excision ganglion cyst left ring finger  01-17-2009  . Implantation permanent spinal cord stimulator  06-15-2008    JUNE 2013--  BATTERY CHANGE  . Foraminal decompression at l2 to the sacrum  01-05-2008    L2  -  S1  . Rotator cuff repair  03-28-2003    RIGHT SHOULDER  DEGENERATIVE AC JOINT AND RC TEAR  . Bladder neck suspension  1970'S  . Bilateral bunionectomies    . Hammertoe repair      BILATERAL  . Vaginal hysterectomy  1970'S    W/ BILATERAL SALPINGO-OOPHORECTOMY  . Appendectomy  AGE 36  . Tonsillectomy  AGE 46  . Shoulder open rotator cuff repair  09/07/2012     Procedure: ROTATOR CUFF REPAIR SHOULDER OPEN;  Surgeon: JMagnus Sinning MD;  [facility]  Service: Orthopedics;  Laterality: Left;  OPEN ANTERIOR ACROMIONECTOMY AND ROTATOR CUFF REPAIR ON LEFT   . Shoulder open rotator cuff repair Left 12/28/2012    Procedure: ROTATOR CUFF REPAIR SHOULDER OPEN;  Surgeon: JMagnus Sinning MD;  [facility] [facility]  Service: Orthopedics;  Laterality: Left;  OPEN SHOULDER ROTATOR CUFF REPAIR ON LEFT  WITH ANTERIOR ACROMINECTOMY ANESTHESIA: GENERAL/SCALENE NERVE BLOCK   Social History: History  Substance Use Topics  . Smoking status: Never Smoker   . Smokeless tobacco: Never Used  . Alcohol Use: No   Additional social history: None  Please also refer to relevant sections of EMR.  Family History: Family History  Problem Relation Age of Onset  . Cancer Mother     breast  . Heart Problems Mother   . Muscular dystrophy Son   . Cancer Maternal Grandmother     breast  . Heart disease Maternal Grandfather   . Prostate cancer Father   . Breast cancer Sister    Allergies and  Medications: Allergies  Allergen Reactions  . Morphine Other (See Comments)    SEVERE HYPOTENSION   . Rosiglitazone Maleate Swelling      (ANTIDIABETIC MED.)  . Tramadol-Acetaminophen Rash   No current facility-administered medications on file prior to encounter.   Current Outpatient Prescriptions on File Prior to Encounter  Medication Sig Dispense Refill  . aspirin (BAYER CHILDRENS ASPIRIN) 81 MG chewable tablet Chew 81 mg by mouth daily.       . Blood Glucose Monitoring Suppl (ACCU-CHEK AVIVA PLUS) W/DEVICE KIT 1 kit by Does not apply route as directed.  1 kit  0  . exenatide (BYETTA 10 MCG PEN) 10 MCG/0.04ML SOLN Inject 0.04 mLs (10 mcg total) into the skin 2 (two) times daily.  1.2 mL  11  . gabapentin (NEURONTIN) 300 MG capsule Take 1 capsule (300 mg total) by mouth at bedtime as needed.  90 capsule  0  . insulin glargine  (LANTUS) 100 UNIT/ML injection Inject 60 Units into the skin 2 (two) times daily. Disp QS for 1 month  10 mL  11  . levothyroxine (SYNTHROID, LEVOTHROID) 50 MCG tablet Take 50 mcg by mouth every morning.       . Omega-3 Fatty Acids (FISH OIL) 1000 MG CAPS Take 1,000 mg by mouth 2 (two) times daily. OTC fish oil      . ondansetron (ZOFRAN ODT) 4 MG disintegrating tablet Take 1 tablet (4 mg total) by mouth every 8 (eight) hours as needed for nausea or vomiting.  20 tablet  0  . promethazine (PHENERGAN) 12.5 MG tablet Take 1 tablet (12.5 mg total) by mouth every 8 (eight) hours as needed for nausea or vomiting.  30 tablet  0    Objective: BP 190/91  Pulse 66  Temp(Src) 98 F (36.7 C) (Oral)  Resp 18  Ht 5' 3"  (1.6 m)  Wt 128 lb 9.6 oz (58.333 kg)  BMI 22.79 kg/m2  SpO2 100% Exam: General: thin tired-appearing woman, laying in bed in no acute distress HEENT: sclera clear, EOMI, PERRL, AT/[place], Dry mucus membranes Cardiovascular: Regular rate and rhythm, no murmur Respiratory: Clear to auscultation bilaterally, no wheezing Abdomen: Mild mid-abdominal tenderness, no guarding or rebound, hypoactive bowel sounds Extremities: No calf tenderness, no edema; thin appearing extremities  Skin: Warm and well perfused Neuro: Normal speech, awake, alert, no focal deficits  Labs and Imaging: CBC BMET   Recent Labs Lab 05/15/14 2045 05/15/14 2052  WBC 5.5  --   HGB 14.7 14.6  HCT 40.2 43.0  PLT 142*  --     Recent Labs Lab 05/15/14 2052  NA 134*  K 2.8*  CL 91*  BUN 18  CREATININE 0.60  GLUCOSE 338*     Dg Chest 2 View  05/15/2014   CLINICAL DATA:  Nausea and vomiting.  EXAM: CHEST  2 VIEW  COMPARISON:  None.  FINDINGS: The heart size and mediastinal contours are within normal limits. Both lungs are clear. The visualized skeletal structures are unremarkable.  IMPRESSION: No active cardiopulmonary disease.   Electronically Signed   By: Kerby Moors M.D.   On: 05/15/2014 22:37     Cordelia Poche, MD 05/15/2014, 11:30 PM PGY-1, [place] Intern pager: [phone], text pages welcome  I have seen and examined this patient with [name] and agree with his assessment and plan above. My additions are noted in blue. Conni Slipper, M.D. PGY-2, [place]

## 2014-05-15 NOTE — ED Notes (Signed)
CBG of 373.

## 2014-05-15 NOTE — ED Notes (Signed)
NP aware of pts BP.

## 2014-05-15 NOTE — ED Notes (Signed)
Pt transported to Xray. 

## 2014-05-15 NOTE — ED Provider Notes (Signed)
Medical screening examination/treatment/procedure(s) were conducted as a shared visit with non-physician practitioner(s) and myself.  I personally evaluated the patient during the encounter.   EKG Interpretation None     Patient presents to the ER for evaluation of nausea and vomiting. Symptoms have been ongoing for 3 weeks. She has seen her doctor for this, has been prescribed Zofran and Phenergan. Symptoms have not improved. Probably is uncontrolled. She is found to be hypokalemic, hyper glycemic. She will require hospitalization for further management as she has continued symptoms despite treatment of fluids here in the ER.  Orpah Greek, MD 05/15/14 2204

## 2014-05-15 NOTE — Progress Notes (Signed)
MD paged for the second time. Awaiting orders.

## 2014-05-16 ENCOUNTER — Encounter (HOSPITAL_COMMUNITY): Payer: Self-pay | Admitting: *Deleted

## 2014-05-16 ENCOUNTER — Inpatient Hospital Stay (HOSPITAL_COMMUNITY): Payer: Medicare Other

## 2014-05-16 DIAGNOSIS — R634 Abnormal weight loss: Secondary | ICD-10-CM

## 2014-05-16 DIAGNOSIS — E876 Hypokalemia: Principal | ICD-10-CM

## 2014-05-16 DIAGNOSIS — R110 Nausea: Secondary | ICD-10-CM

## 2014-05-16 DIAGNOSIS — I872 Venous insufficiency (chronic) (peripheral): Secondary | ICD-10-CM

## 2014-05-16 DIAGNOSIS — E43 Unspecified severe protein-calorie malnutrition: Secondary | ICD-10-CM

## 2014-05-16 DIAGNOSIS — I10 Essential (primary) hypertension: Secondary | ICD-10-CM

## 2014-05-16 DIAGNOSIS — E559 Vitamin D deficiency, unspecified: Secondary | ICD-10-CM

## 2014-05-16 DIAGNOSIS — IMO0001 Reserved for inherently not codable concepts without codable children: Secondary | ICD-10-CM

## 2014-05-16 DIAGNOSIS — E1165 Type 2 diabetes mellitus with hyperglycemia: Secondary | ICD-10-CM

## 2014-05-16 LAB — TSH: TSH: 4.75 u[IU]/mL — ABNORMAL HIGH (ref 0.350–4.500)

## 2014-05-16 LAB — GLUCOSE, CAPILLARY
GLUCOSE-CAPILLARY: 219 mg/dL — AB (ref 70–99)
GLUCOSE-CAPILLARY: 343 mg/dL — AB (ref 70–99)
Glucose-Capillary: 151 mg/dL — ABNORMAL HIGH (ref 70–99)
Glucose-Capillary: 151 mg/dL — ABNORMAL HIGH (ref 70–99)
Glucose-Capillary: 225 mg/dL — ABNORMAL HIGH (ref 70–99)

## 2014-05-16 LAB — CBC
HEMATOCRIT: 36.2 % (ref 36.0–46.0)
Hemoglobin: 13.2 g/dL (ref 12.0–15.0)
MCH: 31.7 pg (ref 26.0–34.0)
MCHC: 36.5 g/dL — AB (ref 30.0–36.0)
MCV: 87 fL (ref 78.0–100.0)
Platelets: 135 10*3/uL — ABNORMAL LOW (ref 150–400)
RBC: 4.16 MIL/uL (ref 3.87–5.11)
RDW: 11.8 % (ref 11.5–15.5)
WBC: 6.9 10*3/uL (ref 4.0–10.5)

## 2014-05-16 LAB — COMPREHENSIVE METABOLIC PANEL
ALBUMIN: 3.4 g/dL — AB (ref 3.5–5.2)
ALT: 11 U/L (ref 0–35)
AST: 11 U/L (ref 0–37)
Alkaline Phosphatase: 54 U/L (ref 39–117)
BUN: 14 mg/dL (ref 6–23)
CALCIUM: 8.7 mg/dL (ref 8.4–10.5)
CO2: 28 mEq/L (ref 19–32)
CREATININE: 0.53 mg/dL (ref 0.50–1.10)
Chloride: 92 mEq/L — ABNORMAL LOW (ref 96–112)
GFR calc Af Amer: >90 mL/min (ref >90)
Glucose, Bld: 253 mg/dL — ABNORMAL HIGH (ref 70–99)
Potassium: 3 mEq/L — ABNORMAL LOW (ref 3.7–5.3)
Sodium: 136 mEq/L — ABNORMAL LOW (ref 137–147)
TOTAL PROTEIN: 6 g/dL (ref 6.0–8.3)
Total Bilirubin: 0.7 mg/dL (ref 0.3–1.2)

## 2014-05-16 LAB — LIPASE, BLOOD: LIPASE: 36 U/L (ref 11–59)

## 2014-05-16 LAB — T4, FREE: Free T4: 2.09 ng/dL — ABNORMAL HIGH (ref 0.80–1.80)

## 2014-05-16 LAB — T3, FREE: T3, Free: 3.9 pg/mL (ref 2.3–4.2)

## 2014-05-16 MED ORDER — VITAMIN D (ERGOCALCIFEROL) 1.25 MG (50000 UNIT) PO CAPS
50000.0000 [IU] | ORAL_CAPSULE | ORAL | Status: DC
  Administered 2014-05-16: 50000 [IU] via ORAL
  Filled 2014-05-16: qty 1

## 2014-05-16 MED ORDER — ONDANSETRON HCL 4 MG/2ML IJ SOLN
4.0000 mg | Freq: Four times a day (QID) | INTRAMUSCULAR | Status: DC | PRN
  Administered 2014-05-16: 4 mg via INTRAVENOUS
  Filled 2014-05-16: qty 2

## 2014-05-16 MED ORDER — PANTOPRAZOLE SODIUM 40 MG PO TBEC
40.0000 mg | DELAYED_RELEASE_TABLET | Freq: Every day | ORAL | Status: DC
  Administered 2014-05-16 – 2014-05-17 (×2): 40 mg via ORAL
  Filled 2014-05-16 (×2): qty 1

## 2014-05-16 MED ORDER — BOOST / RESOURCE BREEZE PO LIQD
1.0000 | Freq: Two times a day (BID) | ORAL | Status: DC
  Administered 2014-05-16: 1 via ORAL

## 2014-05-16 MED ORDER — METOCLOPRAMIDE HCL 10 MG PO TABS
10.0000 mg | ORAL_TABLET | Freq: Four times a day (QID) | ORAL | Status: DC | PRN
  Administered 2014-05-16 – 2014-05-17 (×3): 10 mg via ORAL
  Filled 2014-05-16 (×3): qty 1

## 2014-05-16 MED ORDER — ONDANSETRON HCL 4 MG PO TABS: 4.0000 mg | ORAL_TABLET | Freq: Four times a day (QID) | ORAL | Status: DC | PRN

## 2014-05-16 MED ORDER — POLYETHYLENE GLYCOL 3350 17 G PO PACK
17.0000 g | PACK | Freq: Every day | ORAL | Status: DC | PRN
  Administered 2014-05-16: 17 g via ORAL
  Filled 2014-05-16: qty 1

## 2014-05-16 MED ORDER — INSULIN GLARGINE 100 UNIT/ML ~~LOC~~ SOLN
20.0000 [IU] | Freq: Every day | SUBCUTANEOUS | Status: DC
  Administered 2014-05-16 – 2014-05-17 (×2): 20 [IU] via SUBCUTANEOUS
  Filled 2014-05-16 (×2): qty 0.2

## 2014-05-16 MED ORDER — IOHEXOL 300 MG/ML  SOLN
25.0000 mL | INTRAMUSCULAR | Status: AC
  Administered 2014-05-16 (×2): 25 mL via ORAL

## 2014-05-16 MED ORDER — ASPIRIN 81 MG PO CHEW
81.0000 mg | CHEWABLE_TABLET | Freq: Every day | ORAL | Status: DC
  Administered 2014-05-16 – 2014-05-17 (×2): 81 mg via ORAL
  Filled 2014-05-16 (×2): qty 1

## 2014-05-16 MED ORDER — HEPARIN SODIUM (PORCINE) 5000 UNIT/ML IJ SOLN
5000.0000 [IU] | Freq: Three times a day (TID) | INTRAMUSCULAR | Status: DC
  Administered 2014-05-16: 5000 [IU] via SUBCUTANEOUS
  Filled 2014-05-16 (×7): qty 1

## 2014-05-16 MED ORDER — AMITRIPTYLINE HCL 10 MG PO TABS
10.0000 mg | ORAL_TABLET | Freq: Every day | ORAL | Status: DC
  Administered 2014-05-16 (×2): 10 mg via ORAL
  Filled 2014-05-16 (×3): qty 1

## 2014-05-16 MED ORDER — IOHEXOL 300 MG/ML  SOLN
100.0000 mL | Freq: Once | INTRAMUSCULAR | Status: AC | PRN
  Administered 2014-05-16: 100 mL via INTRAVENOUS

## 2014-05-16 MED ORDER — ONDANSETRON HCL 4 MG/2ML IJ SOLN: 4.0000 mg | Freq: Four times a day (QID) | INTRAMUSCULAR | Status: DC

## 2014-05-16 MED ORDER — INSULIN ASPART 100 UNIT/ML ~~LOC~~ SOLN
0.0000 [IU] | Freq: Three times a day (TID) | SUBCUTANEOUS | Status: DC
  Administered 2014-05-16: 3 [IU] via SUBCUTANEOUS
  Administered 2014-05-16: 5 [IU] via SUBCUTANEOUS
  Administered 2014-05-16: 3 [IU] via SUBCUTANEOUS
  Administered 2014-05-17: 5 [IU] via SUBCUTANEOUS
  Administered 2014-05-17: 3 [IU] via SUBCUTANEOUS

## 2014-05-16 MED ORDER — POTASSIUM CHLORIDE CRYS ER 20 MEQ PO TBCR
40.0000 meq | EXTENDED_RELEASE_TABLET | ORAL | Status: AC
  Administered 2014-05-16 (×3): 40 meq via ORAL
  Filled 2014-05-16 (×3): qty 2

## 2014-05-16 MED ORDER — METOCLOPRAMIDE HCL 10 MG PO TABS
10.0000 mg | ORAL_TABLET | Freq: Four times a day (QID) | ORAL | Status: DC
  Filled 2014-05-16 (×4): qty 1

## 2014-05-16 MED ORDER — GABAPENTIN 300 MG PO CAPS
300.0000 mg | ORAL_CAPSULE | Freq: Every evening | ORAL | Status: DC | PRN
  Filled 2014-05-16: qty 1

## 2014-05-16 MED ORDER — ATORVASTATIN CALCIUM 20 MG PO TABS
20.0000 mg | ORAL_TABLET | Freq: Every day | ORAL | Status: DC
  Administered 2014-05-16 (×2): 20 mg via ORAL
  Filled 2014-05-16 (×3): qty 1

## 2014-05-16 MED ORDER — METOCLOPRAMIDE HCL 5 MG/ML IJ SOLN
10.0000 mg | Freq: Four times a day (QID) | INTRAMUSCULAR | Status: DC
  Administered 2014-05-16 (×2): 10 mg via INTRAVENOUS
  Filled 2014-05-16 (×6): qty 2

## 2014-05-16 MED ORDER — ONDANSETRON HCL 4 MG/2ML IJ SOLN
4.0000 mg | Freq: Four times a day (QID) | INTRAMUSCULAR | Status: DC | PRN
Start: 2014-05-16 — End: 2014-05-16

## 2014-05-16 MED ORDER — INSULIN ASPART 100 UNIT/ML ~~LOC~~ SOLN
0.0000 [IU] | Freq: Every day | SUBCUTANEOUS | Status: DC
  Administered 2014-05-16: 2 [IU] via SUBCUTANEOUS
  Administered 2014-05-16: 4 [IU] via SUBCUTANEOUS

## 2014-05-16 MED ORDER — SODIUM CHLORIDE 0.45 % IV SOLN
INTRAVENOUS | Status: DC
  Administered 2014-05-16 (×3): via INTRAVENOUS

## 2014-05-16 MED ORDER — LEVOTHYROXINE SODIUM 50 MCG PO TABS
50.0000 ug | ORAL_TABLET | Freq: Every day | ORAL | Status: DC
  Administered 2014-05-16 – 2014-05-17 (×2): 50 ug via ORAL
  Filled 2014-05-16 (×3): qty 1

## 2014-05-16 NOTE — Progress Notes (Addendum)
CALL PAGER [phone] for any questions or notifications regarding this patient   FMTS Attending Daily Note: Sara Mcmurray MD  Attending pager:[phone]  office [phone]  I  have seen and examined this patient, reviewed their chart. I have discussed this patient with the resident. I agree with the resident's findings, assessment and care plan. Additionally we noted patient's weight loss over last year. She is UTD on her colon cancer screening (colonoscopy 04/2010), s/p hysterectomy, mammogram in 2014. Her weight loss most likely is from her very severely uncontrolled DM, BUT I think we should at least do a bit more of a work up, especially as she is here for N/V complaints. Will do CT chest abdomen and pelvis. I would also consider EGD if her Nausea and vomiting continues but this could be done outpatient.  Vitals - 1 value per visit 05/16/2014 05/15/2014 05/08/2014 05/03/2014  Weight (lb)  128.6 126.6 132   Vitals - 1 value per visit 04/06/2014 05/26/2013 04/27/2013 03/25/2013 03/16/2013  Weight (lb) 133 138 143.3 147 144.5   Vitals - 1 value per visit 12/29/2012 12/28/2012 12/24/2012 09/08/2012  Weight (lb)  173     Vitals - 1 value per visit 09/07/2012 08/11/2012 07/20/2012 07/07/2012 06/18/2012  Weight (lb) 195 195  194 190.4   Vitals - 1 value per visit 05/19/2012 05/11/2012 04/08/2012 03/15/2012  Weight (lb) 198.5 202 191.6 185   Vitals - 1 value per visit 10/27/2011 10/08/2011 09/16/2011 05/16/2011  Weight (lb) 196.9   205   Vitals - 1 value per visit 04/18/2011 03/31/2011 12/03/2010 11/05/2010  Weight (lb) 208 207.9  210   Vitals - 1 value per visit 10/08/2010 09/20/2010 08/28/2010 08/20/2010  Weight (lb) 210.3 213 216.8 214.5   Vitals - 1 value per visit 08/02/2010 05/07/2010 03/27/2010 01/14/2010 11/20/2009  Weight (lb) 213.5 213.5 214.9 203.8 206   Vitals - 1 value per visit 10/19/2009 10/05/2009 09/26/2009 07/20/2009  Weight (lb) 204 207 204 210.9   Vitals - 1 value per visit 05/28/2009 04/02/2009 02/22/2009 01/23/2009  12/26/2008  Weight (lb) 203.38 209.9 207.8 207.3 215.5   Vitals - 1 value per visit 12/21/2008 08/16/2008 06/12/2008 05/24/2008 04/26/2008  Weight (lb) 214 211.9 217.3 219 221.1   Vitals - 1 value per visit 03/17/2008 10/19/2007 06/29/2007 05/28/2007 05/12/2007  Weight (lb) 216 218.3 223.7 223.9 221   Vitals - 1 value per visit 03/31/2007 02/04/2007  Weight (lb) 222 225

## 2014-05-16 NOTE — Progress Notes (Signed)
INITIAL NUTRITION ASSESSMENT  DOCUMENTATION CODES Per approved criteria  -Severe malnutrition in the context of chronic illness   INTERVENTION: Add Resource Breeze po BID, each supplement provides 250 kcal and 9 grams of protein. RD to continue to follow nutrition care plan.  NUTRITION DIAGNOSIS: Inadequate oral intake related to altered GI function as evidenced by need for slow diet advancement.   Goal: Further diet advancement as tolerated. Intake to meet >90% of estimated nutrition needs.  Monitor:  weight trends, lab trends, I/O's, PO intake, supplement tolerance, diet advancement  Reason for Assessment: Malnutrition Screening Tool + MD Consult  73 y.o. female  Admitting Dx: n/v  ASSESSMENT: PMHx significant for DM and HTN. Admitted with nausea x 3 weeks. Work-up ongoing, team suspects gastroparesis vs gastroenteritis vs medication side effect.  Patient reports 120 lb weight loss x 1 year to MD, however this is not consistent with EPIC records. Pt with the following weight loss: 15 lb x 1 year (10.5%) - not significant 5 lb x 1 month (4%) - not significant  Pt does report poor appetite for about a year, with very poor appetite x these past 3 weeks. Pt with evidence of weight loss during physical exam. She notes that her appetite was so poor that she would go entire days without eating, especially during these past 3 weeks.  Nutrition Focused Physical Exam:  Subcutaneous Fat:  Orbital Region: moderate depletion Upper Arm Region: severe depletion Thoracic and Lumbar Region: n/a  Muscle:  Temple Region: moderate depletion Clavicle Bone Region: severe depletion Clavicle and Acromion Bone Region: moderate depletion Scapular Bone Region: n/a Dorsal Hand: n/a Patellar Region: moderate depletion Anterior Thigh Region: moderate depletion Posterior Calf Region: severe depletion  Edema: n/a  Pt meets criteria for severe MALNUTRITION in the context of chronic illness as  evidenced by severe fat and muscle mass loss, intake of <75% x at least 1 month.  Currently ordered for Clear Liquids. She states that she tolerated her clear liquids well, but ate minimal, had some jello and sips of broth.  She states that she is unable to tolerate milk and cannot tolerate Ensure products well, agreeable to [employer].  Sodium low at 136 Potassium low at 3.0 CBG's: 151 - 343  Height: Ht Readings from Last 1 Encounters:  05/15/14 5\' 3"  (1.6 m)    Weight: Wt Readings from Last 1 Encounters:  05/15/14 128 lb 9.6 oz (58.333 kg)    Ideal Body Weight: 115 lb  % Ideal Body Weight: 111%  Wt Readings from Last 20 Encounters:  05/15/14 128 lb 9.6 oz (58.333 kg)  05/08/14 126 lb 9.6 oz (57.425 kg)  05/03/14 132 lb (59.875 kg)  04/06/14 133 lb (60.328 kg)  05/26/13 138 lb (62.596 kg)  04/27/13 143 lb 4.8 oz (65 kg)  03/25/13 147 lb (66.679 kg)  03/16/13 144 lb 8 oz (65.545 kg)  12/28/12 173 lb (78.472 kg)  12/28/12 173 lb (78.472 kg)  09/07/12 195 lb (88.451 kg)  09/07/12 195 lb (88.451 kg)  08/11/12 195 lb (88.451 kg)  07/07/12 194 lb (87.998 kg)  06/18/12 190 lb 6.4 oz (86.365 kg)  05/19/12 198 lb 8 oz (90.039 kg)  05/11/12 202 lb (91.627 kg)  04/08/12 191 lb 9.6 oz (86.909 kg)  03/15/12 185 lb (83.915 kg)  10/27/11 196 lb 14.4 oz (89.313 kg)    Usual Body Weight: 143 lb  % Usual Body Weight: 89.5%  BMI:  Body mass index is 22.79 kg/(m^2). WNL  Estimated  Nutritional Needs: Kcal: 1450 - 1600 Protein: 60 - 75 g Fluid: at least 1.5 liters daily  Skin: L nare wound from bx  Diet Order: Clear Liquid  EDUCATION NEEDS: -Education not appropriate at this time   Intake/Output Summary (Last 24 hours) at 05/16/14 1231 Last data filed at 05/16/14 1138  Gross per 24 hour  Intake 883.33 ml  Output      0 ml  Net 883.33 ml    Last BM: 6/20   Labs:   Recent Labs Lab 05/15/14 2052 05/16/14 0500  NA 134* 136*  K 2.8* 3.0*  CL 91* 92*   CO2  --  28  BUN 18 14  CREATININE 0.60 0.53  CALCIUM  --  8.7  GLUCOSE 338* 253*    CBG (last 3)   Recent Labs  05/16/14 0038 05/16/14 0815 05/16/14 1157  GLUCAP 343* 219* 151*    Scheduled Meds: . amitriptyline  10 mg Oral QHS  . aspirin  81 mg Oral Daily  . atorvastatin  20 mg Oral QHS  . heparin  5,000 Units Subcutaneous 3 times per day  . losartan  100 mg Oral QHS   And  . hydrochlorothiazide  25 mg Oral QHS  . insulin aspart  0-15 Units Subcutaneous TID WC  . insulin aspart  0-5 Units Subcutaneous QHS  . insulin glargine  20 Units Subcutaneous Daily  . levothyroxine  50 mcg Oral QAC breakfast  . pantoprazole  40 mg Oral Daily  . potassium chloride  40 mEq Oral Q3H  . Vitamin D (Ergocalciferol)  50,000 Units Oral Q7 days    Continuous Infusions: . sodium chloride 100 mL/hr at 05/16/14 1200    Past Medical History  Diagnosis Date  . H. pylori infection 2008 and 1998    treated  . Multinodular goiter   . Hx of colonoscopy with polypectomy   . Depression   . Hyperlipidemia   . Hypertension   . Arthritis   . Urge urinary incontinence   . Diabetes mellitus, type II, insulin dependent   . Rotator cuff tear, left recurrent     Past Surgical History  Procedure Laterality Date  . 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 Lipoma excision      RIGHT ELBOW  . Excision ganglion cyst left ring finger  01-17-2009  . Implantation permanent spinal cord stimulator  06-15-2008    JUNE 2013--  BATTERY CHANGE  . Foraminal decompression at l2 to the sacrum  01-05-2008    L2  -  S1  . Rotator cuff repair  03-28-2003    RIGHT SHOULDER  DEGENERATIVE AC JOINT AND RC TEAR  . Bladder neck suspension  1970'S  . Bilateral bunionectomies    . Hammertoe repair      BILATERAL  . Vaginal hysterectomy  1970'S    W/ BILATERAL SALPINGO-OOPHORECTOMY  . Appendectomy  AGE 59  . Tonsillectomy  AGE 39  . Shoulder open rotator cuff repair  09/07/2012    Procedure:  ROTATOR CUFF REPAIR SHOULDER OPEN;  Surgeon: Magnus Sinning, MD;  Location: [facility];  Service: Orthopedics;  Laterality: Left;  OPEN ANTERIOR ACROMIONECTOMY AND ROTATOR CUFF REPAIR ON LEFT   . Shoulder open rotator cuff repair Left 12/28/2012    Procedure: ROTATOR CUFF REPAIR SHOULDER OPEN;  Surgeon: Magnus Sinning, MD;  Location: [facility];  Service: Orthopedics;  Laterality: Left;  OPEN SHOULDER ROTATOR CUFF REPAIR ON LEFT  WITH ANTERIOR ACROMINECTOMY ANESTHESIA: GENERAL/SCALENE NERVE BLOCK    Inda Coke MS, RD, LDN Inpatient Registered Dietitian Pager: [phone] After-hours pager: [phone]

## 2014-05-16 NOTE — Discharge Summary (Signed)
[facility] Discharge Summary  Patient name: Sara Graves Medical record number: [id number] Date of birth: [birth year] Age: 73 y.o. Gender: female Date of Admission: 05/15/2014  Date of Discharge: 05/17/2014  Admitting Physician: Alveda Reasons, MD  Primary Care Provider: Kennith Maes, DO Consultants: None  Indication for Hospitalization: Intractable nausea and vomiting  Discharge Diagnoses/Problem List:  Intractable nausea and vomiting Hypokalemic hypochloremic metabolic acidosis Diabetes mellitus, type 2 Constipation Hypothyroidism Weight loss  Disposition: Discharge home  Discharge Condition: Stable  Discharge Exam:  General: Laying in bed, resting, thin appearing, in no acute distress  Cardiovascular: Regular rate and rhythm, no murmur  Respiratory: Clear to auscultation bilaterally, no wheezing  Abdomen: Soft, non-tender, non-distended  Extremities: No edema, no calf tenderness   Brief Hospital Course:   HPI: Sara Graves is a 73 y.o. female presenting with intractable nausea and vomiting  She has been nauseated 3 weeks and had been trying zofran/phenergan outpatient which were not helping, so she called PCP who recommended she come to ED for eval. In the ED, she took reglan which helped though she has continued feeling nauseated. Symptoms occurred throughout the days whenever she tried to drink liquids. Vomit was clear. It was not associated with any time of day or food. She has not had fevers, sick contacts, new foods or unusual foods, and no diarrhea. She has not had a bowel movement in 3-4 days. She is urinating normally. She has not had similar symptoms in the past. She denies dyspnea or chest pain. She has had mild abdominal pain. Denies lightheadedness/dizziness. Only complaint has been nausea/vomiting, with no tachycardia/palpitations. She has not been taking her medication (including injectable insulin) because of nausea.  In the ED,  she had an episode of oxygen desaturation to 60% on room air which resolved to 100% on 2L O2, with unclear amt of time in 60s. In the hospital room, she is now satting 100% on room air. She also got benadryl, labetalol, reglan, kdur 40, 44mq x 4, and a 250 cc normal saline bolus. Abdominal pain and nausea have greatly improved  Course: patient initially started on IV Reglan and Zofran, which continued to help symptoms. Her nausea and vomiting improved. Diet was advanced as patient tolerated. Potassium repleted as needed and diabetes managed with Lantus 20u daily and sliding scale insulin. Obtained CT chest, abdomen and pelvis with no evidence of malignancy, but did show non-specific distal esophageal wall thickening and adrenal hyperplasia  Issues for Follow Up:  1. Diabetes. On Lantus 60 units BID at home and 40-45 units novolog daily. Has been controlled with relatively small amount of insulin, but patient also not eating as she normally would. Still question possible compliance issue. 2. Weight loss not explained by CT imaging. May require EGD outpatient.  Significant Procedures: None  Significant Labs and Imaging:   Recent Labs Lab 05/15/14 2045 05/15/14 2052 05/16/14 0500  WBC 5.5  --  6.9  HGB 14.7 14.6 13.2  HCT 40.2 43.0 36.2  PLT 142*  --  135*    Recent Labs Lab 05/15/14 2052 05/16/14 0500  NA 134* 136*  K 2.8* 3.0*  CL 91* 92*  CO2  --  28  GLUCOSE 338* 253*  BUN 18 14  CREATININE 0.60 0.53  CALCIUM  --  8.7  ALKPHOS  --  54  AST  --  11  ALT  --  11  ALBUMIN  --  3.4*    Lab Results  Component Value Date   TSH 4.750* 05/16/2014   FREET4 2.09* 05/16/2014   T3FREE 3.9 05/16/2014   Imagining:  CT Chest, Abdomen, and Pelvis impressions  1. No explanation for weight loss identified within the chest,  abdomen or pelvis. No evidence of malignancy.  2. Mild nonspecific distal esophageal wall thickening.  3. Adrenal hyperplasia, mild atherosclerosis and lumbar  spondylosis  noted.  Results/Tests Pending at Time of Discharge: None  Discharge Medications:    Medication List    ASK your doctor about these medications       ACCU-CHEK ACTIVE VI  2-3 each by In Vitro route See admin instructions. Check blood sugar 2-3 times daily.     ACCU-CHEK AVIVA PLUS W/DEVICE Kit  1 kit by Does not apply route as directed.     amitriptyline 10 MG tablet  Commonly known as:  ELAVIL  Take 10 mg by mouth at bedtime.     atorvastatin 20 MG tablet  Commonly known as:  LIPITOR  Take 20 mg by mouth at bedtime.     BAYER CHILDRENS ASPIRIN 81 MG chewable tablet  Generic drug:  aspirin  Chew 81 mg by mouth daily.     exenatide 10 MCG/0.04ML Sopn injection  Commonly known as:  BYETTA 10 MCG PEN  Inject 0.04 mLs (10 mcg total) into the skin 2 (two) times daily.     Fish Oil 1000 MG Caps  Take 1,000 mg by mouth 2 (two) times daily. OTC fish oil     gabapentin 300 MG capsule  Commonly known as:  NEURONTIN  Take 1 capsule (300 mg total) by mouth at bedtime as needed.     insulin aspart 100 UNIT/ML injection  Commonly known as:  novoLOG  Inject 40-45 Units into the skin See admin instructions. Inject 45 units subq before breakfast, 40 units subq before lunch and 40 units subq before dinner.     insulin glargine 100 UNIT/ML injection  Commonly known as:  LANTUS  Inject 60 Units into the skin 2 (two) times daily. Disp QS for 1 month     levothyroxine 50 MCG tablet  Commonly known as:  SYNTHROID, LEVOTHROID  Take 50 mcg by mouth every morning.     losartan-hydrochlorothiazide 100-25 MG per tablet  Commonly known as:  HYZAAR  Take 1 tablet by mouth daily.     ondansetron 4 MG disintegrating tablet  Commonly known as:  ZOFRAN ODT  Take 1 tablet (4 mg total) by mouth every 8 (eight) hours as needed for nausea or vomiting.     promethazine 12.5 MG tablet  Commonly known as:  PHENERGAN  Take 1 tablet (12.5 mg total) by mouth every 8 (eight) hours as  needed for nausea or vomiting.     Vitamin D (Ergocalciferol) 50000 UNITS Caps capsule  Commonly known as:  DRISDOL  Take 50,000 Units by mouth every 7 (seven) days.        Discharge Instructions: Please refer to Patient Instructions section of EMR for full details.  Patient was counseled important signs and symptoms that should prompt return to medical care, changes in medications, dietary instructions, activity restrictions, and follow up appointments.   Follow-Up Appointments: 1) An appointment with her primary care provider has already been made for July 16th. 2) Consider EGD 3) Consider repeat thyroid labs.  Elberta Leatherwood, MD 05/17/2014, 1:40 PM PGY-1, [place]

## 2014-05-16 NOTE — Progress Notes (Signed)
Utilization review completed.  

## 2014-05-16 NOTE — Progress Notes (Signed)
Family Medicine Teaching Service Daily Progress Note Intern Pager: [phone]  Patient name: Sara Graves Medical record number: [id number] Date of birth: [birth year] Age: 73 y.o. Gender: female  Primary Care Provider: Kennith Maes, DO Consultants: None Code Status: Full code  Pt Overview and Major Events to Date:  6/30: patient admitted  Assessment and Plan: Sara Graves is a 73 y.o. female presenting with nausea and vomiting. PMH is significant for Diabetes mellitus, hypertension   # Nausea and vomiting: Improved. Looking like patient has gastroparesis. Lipase 36. - reglan PO - zofran q6 hrs PRN - f/u EKG   # Hypokalemic hypochloremic metabolic alkalosis: most likely a direct result of frequent vomiting  - manage as in above problem - replete potassium as needed  # Hypokalemia: recent baseline potassium of 3.2-3.4. Currently 2.8. S/p K-dur in ED. S/p K 16meq x4. Potassium 3.0 today. - potassium 9meq q4 hrs x2 - monitor bmet   # Diabetes mellitus, type 2: currently hyperglycemic to 343. Takes Lantus and Novolog outpatient. Fasting CBG: 219 - Lantus 20u daily  - SSI, moderate   # Constipation: no bowel movement since two days ago (Saturday 6/27). Most likely because of poor PO intake.  - Miralax daily   # Hypothyroidism: currently receiving treatment  - continue levothyroxine   # ?oxygen requirement: s/p desat to 60s in ED. Currently on room air. Chest x-ray not suggestive of pulmonary disease  - vitals per unit   FEN/GI: clear liquid diet, 1/2NS @ 142ml/hr Prophylaxis: Heparin subq  Disposition: Possibly discharge home today. Will monitor intake today. Will follow-up potassium  Subjective:  Patient feels much better today. She has a little nausea and had a small volume emesis overnight. Her abdominal pain is improved. She will try to take in clears today.  Objective: Temp:  [97.2 F (36.2 C)-98.2 F (36.8 C)] 98.2 F (36.8 C) (06/30 0429) Pulse Rate:  [64-79]  76 (06/30 0429) Resp:  [14-23] 18 (06/30 0429) BP: (124-205)/(65-91) 124/74 mmHg (06/30 0429) SpO2:  [100 %] 100 % (06/30 0429) Weight:  [128 lb 9.6 oz (58.333 kg)-130 lb (58.968 kg)] 128 lb 9.6 oz (58.333 kg) (06/29 2245)  Physical Exam: General: Laying in bed, resting, thin appearing, in no acute distress Cardiovascular: Regular rate and rhythm, no murmur Respiratory: Clear to auscultation bilaterally, no wheezing Abdomen: Soft, non-tender, non-distended Extremities: No edema, no calf tenderness  Laboratory:  Recent Labs Lab 05/15/14 2045 05/15/14 2052 05/16/14 0500  WBC 5.5  --  6.9  HGB 14.7 14.6 13.2  HCT 40.2 43.0 36.2  PLT 142*  --  135*    Recent Labs Lab 05/15/14 2052  NA 134*  K 2.8*  CL 91*  BUN 18  CREATININE 0.60  GLUCOSE 338*   Imaging/Diagnostic Tests: Dg Chest 2 View  05/15/2014   CLINICAL DATA:  Nausea and vomiting.  EXAM: CHEST  2 VIEW  COMPARISON:  None.  FINDINGS: The heart size and mediastinal contours are within normal limits. Both lungs are clear. The visualized skeletal structures are unremarkable.  IMPRESSION: No active cardiopulmonary disease.   Electronically Signed   By: Kerby Moors M.D.   On: 05/15/2014 22:37    Cordelia Poche, MD 05/16/2014, 6:09 AM PGY-1, [place] Intern pager: [phone], text pages welcome

## 2014-05-17 ENCOUNTER — Encounter (HOSPITAL_COMMUNITY): Payer: Self-pay | Admitting: Family Medicine

## 2014-05-17 LAB — BASIC METABOLIC PANEL
Anion gap: 14 (ref 5–15)
BUN: 8 mg/dL (ref 6–23)
CALCIUM: 9.4 mg/dL (ref 8.4–10.5)
CO2: 26 mEq/L (ref 19–32)
Chloride: 95 mEq/L — ABNORMAL LOW (ref 96–112)
Creatinine, Ser: 0.55 mg/dL (ref 0.50–1.10)
GFR calc Af Amer: >90 mL/min (ref >90)
GLUCOSE: 207 mg/dL — AB (ref 70–99)
POTASSIUM: 4.2 meq/L (ref 3.7–5.3)
SODIUM: 135 meq/L — AB (ref 137–147)

## 2014-05-17 LAB — GLUCOSE, CAPILLARY
GLUCOSE-CAPILLARY: 212 mg/dL — AB (ref 70–99)
Glucose-Capillary: 166 mg/dL — ABNORMAL HIGH (ref 70–99)

## 2014-05-17 MED ORDER — METOCLOPRAMIDE HCL 10 MG PO TABS: 10.0000 mg | ORAL_TABLET | Freq: Four times a day (QID) | ORAL | Status: DC | PRN

## 2014-05-17 NOTE — Progress Notes (Signed)
Tito Dine to be D/C'd Home per MD order.  Discussed with the patient and all questions fully answered.    Medication List    STOP taking these medications       ondansetron 4 MG disintegrating tablet  Commonly known as:  ZOFRAN ODT     promethazine 12.5 MG tablet  Commonly known as:  PHENERGAN      TAKE these medications       ACCU-CHEK ACTIVE VI  2-3 each by In Vitro route See admin instructions. Check blood sugar 2-3 times daily.     ACCU-CHEK AVIVA PLUS W/DEVICE Kit  1 kit by Does not apply route as directed.     amitriptyline 10 MG tablet  Commonly known as:  ELAVIL  Take 10 mg by mouth at bedtime.     atorvastatin 20 MG tablet  Commonly known as:  LIPITOR  Take 20 mg by mouth at bedtime.     BAYER CHILDRENS ASPIRIN 81 MG chewable tablet  Generic drug:  aspirin  Chew 81 mg by mouth daily.     exenatide 10 MCG/0.04ML Sopn injection  Commonly known as:  BYETTA 10 MCG PEN  Inject 0.04 mLs (10 mcg total) into the skin 2 (two) times daily.     Fish Oil 1000 MG Caps  Take 1,000 mg by mouth 2 (two) times daily. OTC fish oil     gabapentin 300 MG capsule  Commonly known as:  NEURONTIN  Take 1 capsule (300 mg total) by mouth at bedtime as needed.     insulin aspart 100 UNIT/ML injection  Commonly known as:  novoLOG  Inject 40-45 Units into the skin See admin instructions. Inject 45 units subq before breakfast, 40 units subq before lunch and 40 units subq before dinner.     insulin glargine 100 UNIT/ML injection  Commonly known as:  LANTUS  Inject 60 Units into the skin 2 (two) times daily. Disp QS for 1 month     levothyroxine 50 MCG tablet  Commonly known as:  SYNTHROID, LEVOTHROID  Take 50 mcg by mouth every morning.     losartan-hydrochlorothiazide 100-25 MG per tablet  Commonly known as:  HYZAAR  Take 1 tablet by mouth daily.     metoCLOPramide 10 MG tablet  Commonly known as:  REGLAN  Take 1 tablet (10 mg total) by mouth every 6 (six) hours as  needed for nausea.     Vitamin D (Ergocalciferol) 50000 UNITS Caps capsule  Commonly known as:  DRISDOL  Take 50,000 Units by mouth every 7 (seven) days.        VVS. IV catheter discontinued intact. Site without signs and symptoms of complications. Dressing and pressure applied.  An After Visit Summary was printed and given to the patient.  D/c education completed with patient/family including follow up instructions, medication list, d/c activities limitations if indicated, with other d/c instructions as indicated by MD - patient able to verbalize understanding, all questions fully answered.   Patient instructed to return to ED, call 911, or call MD for any changes in condition.   Patient escorted via [place], and D/C home via private auto.  [name] 05/17/2014 2:07 PM

## 2014-05-17 NOTE — Discharge Instructions (Signed)
You were admitted with intractable nausea and vomiting, as well as hypoxia experienced in the emergency department. You were treated with oxygen via nasal canula, Reglan, and Zofran.   Please follow up with your primary care provider (Dr. Awanda Mink). An appointment has already been scheduled for you on July 16th.

## 2014-05-18 NOTE — Discharge Summary (Signed)
Family Medicine Teaching Service  Discharge Note : Attending Dorcas Mcmurray MD Pager [phone] Office [phone] I have seen and examined this patient, reviewed their chart and discussed discharge planning with the resident at the time of discharge. I agree with the discharge plan as above.

## 2014-05-30 DIAGNOSIS — M549 Dorsalgia, unspecified: Secondary | ICD-10-CM

## 2014-05-30 DIAGNOSIS — C4491 Basal cell carcinoma of skin, unspecified: Secondary | ICD-10-CM | POA: Insufficient documentation

## 2014-05-30 DIAGNOSIS — G8929 Other chronic pain: Secondary | ICD-10-CM | POA: Insufficient documentation

## 2014-06-01 ENCOUNTER — Inpatient Hospital Stay: Payer: Medicare Other | Admitting: Family Medicine

## 2014-06-02 ENCOUNTER — Other Ambulatory Visit (HOSPITAL_COMMUNITY): Payer: Self-pay | Admitting: Gastroenterology

## 2014-06-02 DIAGNOSIS — R634 Abnormal weight loss: Secondary | ICD-10-CM

## 2014-06-02 DIAGNOSIS — R112 Nausea with vomiting, unspecified: Secondary | ICD-10-CM

## 2014-06-06 ENCOUNTER — Other Ambulatory Visit: Payer: Self-pay

## 2014-06-06 DIAGNOSIS — Z1231 Encounter for screening mammogram for malignant neoplasm of breast: Secondary | ICD-10-CM

## 2014-06-09 ENCOUNTER — Ambulatory Visit (HOSPITAL_COMMUNITY)
Admission: RE | Admit: 2014-06-09 | Discharge: 2014-06-09 | Disposition: A | Discharge status: Home / Self Care | Payer: Medicare Other | Source: Ambulatory Visit | Attending: Diagnostic Radiology | Admitting: Diagnostic Radiology

## 2014-06-09 DIAGNOSIS — R112 Nausea with vomiting, unspecified: Secondary | ICD-10-CM

## 2014-06-09 DIAGNOSIS — R634 Abnormal weight loss: Secondary | ICD-10-CM | POA: Diagnosis not present

## 2014-06-09 MED ORDER — TECHNETIUM TC 99M SULFUR COLLOID
2.0000 | Freq: Once | INTRAVENOUS | Status: AC | PRN
  Administered 2014-06-09: 2 via ORAL

## 2014-07-10 ENCOUNTER — Ambulatory Visit
Admission: RE | Admit: 2014-07-10 | Discharge: 2014-07-10 | Disposition: A | Discharge status: Home / Self Care | Payer: Medicare Other | Source: Ambulatory Visit

## 2014-07-10 DIAGNOSIS — Z1231 Encounter for screening mammogram for malignant neoplasm of breast: Secondary | ICD-10-CM

## 2014-08-29 ENCOUNTER — Inpatient Hospital Stay (HOSPITAL_COMMUNITY)
Admission: EM | Admit: 2014-08-29 | Discharge: 2014-09-02 | DRG: 074 | Disposition: A | Discharge status: Home / Self Care | Payer: Medicare Other | Attending: Internal Medicine | Admitting: Internal Medicine

## 2014-08-29 ENCOUNTER — Encounter (HOSPITAL_COMMUNITY): Payer: Self-pay | Admitting: Emergency Medicine

## 2014-08-29 ENCOUNTER — Emergency Department (HOSPITAL_COMMUNITY): Payer: Medicare Other

## 2014-08-29 DIAGNOSIS — Z90710 Acquired absence of both cervix and uterus: Secondary | ICD-10-CM | POA: Diagnosis not present

## 2014-08-29 DIAGNOSIS — R739 Hyperglycemia, unspecified: Secondary | ICD-10-CM

## 2014-08-29 DIAGNOSIS — E039 Hypothyroidism, unspecified: Secondary | ICD-10-CM | POA: Diagnosis present

## 2014-08-29 DIAGNOSIS — E785 Hyperlipidemia, unspecified: Secondary | ICD-10-CM | POA: Diagnosis not present

## 2014-08-29 DIAGNOSIS — F329 Major depressive disorder, single episode, unspecified: Secondary | ICD-10-CM | POA: Diagnosis not present

## 2014-08-29 DIAGNOSIS — Z794 Long term (current) use of insulin: Secondary | ICD-10-CM | POA: Diagnosis not present

## 2014-08-29 DIAGNOSIS — Z96659 Presence of unspecified artificial knee joint: Secondary | ICD-10-CM | POA: Diagnosis present

## 2014-08-29 DIAGNOSIS — E1165 Type 2 diabetes mellitus with hyperglycemia: Secondary | ICD-10-CM

## 2014-08-29 DIAGNOSIS — E1143 Type 2 diabetes mellitus with diabetic autonomic (poly)neuropathy: Principal | ICD-10-CM | POA: Diagnosis present

## 2014-08-29 DIAGNOSIS — E871 Hypo-osmolality and hyponatremia: Secondary | ICD-10-CM | POA: Diagnosis present

## 2014-08-29 DIAGNOSIS — K3184 Gastroparesis: Secondary | ICD-10-CM | POA: Diagnosis not present

## 2014-08-29 DIAGNOSIS — R109 Unspecified abdominal pain: Secondary | ICD-10-CM

## 2014-08-29 DIAGNOSIS — D696 Thrombocytopenia, unspecified: Secondary | ICD-10-CM | POA: Diagnosis present

## 2014-08-29 DIAGNOSIS — Z7982 Long term (current) use of aspirin: Secondary | ICD-10-CM | POA: Diagnosis not present

## 2014-08-29 DIAGNOSIS — R112 Nausea with vomiting, unspecified: Secondary | ICD-10-CM | POA: Diagnosis present

## 2014-08-29 DIAGNOSIS — M1990 Unspecified osteoarthritis, unspecified site: Secondary | ICD-10-CM | POA: Diagnosis not present

## 2014-08-29 DIAGNOSIS — I10 Essential (primary) hypertension: Secondary | ICD-10-CM | POA: Diagnosis present

## 2014-08-29 DIAGNOSIS — E876 Hypokalemia: Secondary | ICD-10-CM | POA: Diagnosis not present

## 2014-08-29 DIAGNOSIS — R1013 Epigastric pain: Secondary | ICD-10-CM

## 2014-08-29 DIAGNOSIS — Z888 Allergy status to other drugs, medicaments and biological substances status: Secondary | ICD-10-CM

## 2014-08-29 DIAGNOSIS — K2970 Gastritis, unspecified, without bleeding: Secondary | ICD-10-CM | POA: Diagnosis not present

## 2014-08-29 DIAGNOSIS — Z885 Allergy status to narcotic agent status: Secondary | ICD-10-CM | POA: Diagnosis not present

## 2014-08-29 DIAGNOSIS — R1110 Vomiting, unspecified: Secondary | ICD-10-CM

## 2014-08-29 DIAGNOSIS — IMO0002 Reserved for concepts with insufficient information to code with codable children: Secondary | ICD-10-CM

## 2014-08-29 HISTORY — DX: Bursitis of left shoulder: M75.52

## 2014-08-29 LAB — COMPREHENSIVE METABOLIC PANEL
ALK PHOS: 76 U/L (ref 39–117)
ALT: 12 U/L (ref 0–35)
ANION GAP: 19 — AB (ref 5–15)
AST: 13 U/L (ref 0–37)
Albumin: 4.4 g/dL (ref 3.5–5.2)
BILIRUBIN TOTAL: 1.2 mg/dL (ref 0.3–1.2)
BUN: 22 mg/dL (ref 6–23)
CHLORIDE: 92 meq/L — AB (ref 96–112)
CO2: 24 mEq/L (ref 19–32)
CREATININE: 0.6 mg/dL (ref 0.50–1.10)
Calcium: 9.8 mg/dL (ref 8.4–10.5)
GFR calc Af Amer: >90 mL/min (ref >90)
GFR calc non Af Amer: 88 mL/min — ABNORMAL LOW (ref >90)
Glucose, Bld: 351 mg/dL — ABNORMAL HIGH (ref 70–99)
Potassium: 3 mEq/L — ABNORMAL LOW (ref 3.7–5.3)
Sodium: 135 mEq/L — ABNORMAL LOW (ref 137–147)
Total Protein: 7.5 g/dL (ref 6.0–8.3)

## 2014-08-29 LAB — CBC WITH DIFFERENTIAL/PLATELET
Basophils Absolute: 0 10*3/uL (ref 0.0–0.1)
Basophils Relative: 0 % (ref 0–1)
EOS ABS: 0 10*3/uL (ref 0.0–0.7)
EOS PCT: 0 % (ref 0–5)
HEMATOCRIT: 38.5 % (ref 36.0–46.0)
HEMOGLOBIN: 14.4 g/dL (ref 12.0–15.0)
Lymphocytes Relative: 18 % (ref 12–46)
Lymphs Abs: 0.8 10*3/uL (ref 0.7–4.0)
MCH: 32.4 pg (ref 26.0–34.0)
MCHC: 37.4 g/dL — AB (ref 30.0–36.0)
MCV: 86.5 fL (ref 78.0–100.0)
MONO ABS: 0.1 10*3/uL (ref 0.1–1.0)
MONOS PCT: 3 % (ref 3–12)
Neutro Abs: 3.7 10*3/uL (ref 1.7–7.7)
Neutrophils Relative %: 79 % — ABNORMAL HIGH (ref 43–77)
Platelets: 120 10*3/uL — ABNORMAL LOW (ref 150–400)
RBC: 4.45 MIL/uL (ref 3.87–5.11)
RDW: 11.8 % (ref 11.5–15.5)
WBC: 4.6 10*3/uL (ref 4.0–10.5)

## 2014-08-29 LAB — URINALYSIS, ROUTINE W REFLEX MICROSCOPIC
Bilirubin Urine: NEGATIVE
Hgb urine dipstick: NEGATIVE
Ketones, ur: 15 mg/dL — AB
LEUKOCYTES UA: NEGATIVE
Nitrite: NEGATIVE
Protein, ur: NEGATIVE mg/dL
Specific Gravity, Urine: 1.016 (ref 1.005–1.030)
Urobilinogen, UA: 0.2 mg/dL (ref 0.0–1.0)
pH: 7 (ref 5.0–8.0)

## 2014-08-29 LAB — URINE MICROSCOPIC-ADD ON

## 2014-08-29 LAB — CBG MONITORING, ED
GLUCOSE-CAPILLARY: 353 mg/dL — AB (ref 70–99)
GLUCOSE-CAPILLARY: 289 mg/dL — AB (ref 70–99)

## 2014-08-29 LAB — I-STAT TROPONIN, ED: Troponin i, poc: 0.01 ng/mL (ref 0.00–0.08)

## 2014-08-29 LAB — LIPASE, BLOOD: Lipase: 10 U/L — ABNORMAL LOW (ref 11–59)

## 2014-08-29 LAB — GLUCOSE, CAPILLARY: Glucose-Capillary: 166 mg/dL — ABNORMAL HIGH (ref 70–99)

## 2014-08-29 MED ORDER — FLUOXETINE HCL 20 MG PO CAPS
20.0000 mg | ORAL_CAPSULE | Freq: Every day | ORAL | Status: DC
  Administered 2014-08-29 – 2014-09-02 (×5): 20 mg via ORAL
  Filled 2014-08-29 (×5): qty 1

## 2014-08-29 MED ORDER — INSULIN ASPART 100 UNIT/ML ~~LOC~~ SOLN
0.0000 [IU] | Freq: Three times a day (TID) | SUBCUTANEOUS | Status: DC
  Administered 2014-08-30: 3 [IU] via SUBCUTANEOUS
  Administered 2014-08-30 (×2): 2 [IU] via SUBCUTANEOUS
  Administered 2014-08-31 – 2014-09-01 (×4): 3 [IU] via SUBCUTANEOUS
  Administered 2014-09-02: 2 [IU] via SUBCUTANEOUS

## 2014-08-29 MED ORDER — SODIUM CHLORIDE 0.9 % IV BOLUS (SEPSIS)
500.0000 mL | Freq: Once | INTRAVENOUS | Status: AC
  Administered 2014-08-29: 500 mL via INTRAVENOUS

## 2014-08-29 MED ORDER — METOCLOPRAMIDE HCL 5 MG/ML IJ SOLN
10.0000 mg | Freq: Once | INTRAMUSCULAR | Status: AC
  Administered 2014-08-29: 10 mg via INTRAVENOUS
  Filled 2014-08-29: qty 2

## 2014-08-29 MED ORDER — INSULIN GLARGINE 100 UNIT/ML ~~LOC~~ SOLN
2.0000 [IU] | Freq: Two times a day (BID) | SUBCUTANEOUS | Status: DC
  Administered 2014-08-29 – 2014-09-01 (×7): 2 [IU] via SUBCUTANEOUS
  Filled 2014-08-29 (×8): qty 0.02

## 2014-08-29 MED ORDER — INFLUENZA VAC SPLIT QUAD 0.5 ML IM SUSY
0.5000 mL | PREFILLED_SYRINGE | INTRAMUSCULAR | Status: AC
  Administered 2014-08-30: 0.5 mL via INTRAMUSCULAR
  Filled 2014-08-29 (×2): qty 0.5

## 2014-08-29 MED ORDER — SODIUM CHLORIDE 0.9 % IV SOLN
INTRAVENOUS | Status: AC
  Administered 2014-08-29: 21:00:00 via INTRAVENOUS

## 2014-08-29 MED ORDER — HYDROCHLOROTHIAZIDE 25 MG PO TABS
25.0000 mg | ORAL_TABLET | Freq: Every day | ORAL | Status: DC
  Administered 2014-08-29 – 2014-08-30 (×2): 25 mg via ORAL
  Filled 2014-08-29 (×2): qty 1

## 2014-08-29 MED ORDER — LOSARTAN POTASSIUM-HCTZ 100-25 MG PO TABS: 1.0000 | ORAL_TABLET | Freq: Every day | ORAL | Status: DC

## 2014-08-29 MED ORDER — PNEUMOCOCCAL VAC POLYVALENT 25 MCG/0.5ML IJ INJ
0.5000 mL | INJECTION | INTRAMUSCULAR | Status: AC
  Administered 2014-08-30: 0.5 mL via INTRAMUSCULAR
  Filled 2014-08-29 (×2): qty 0.5

## 2014-08-29 MED ORDER — ATORVASTATIN CALCIUM 20 MG PO TABS
20.0000 mg | ORAL_TABLET | Freq: Every day | ORAL | Status: DC
  Administered 2014-08-29 – 2014-09-01 (×4): 20 mg via ORAL
  Filled 2014-08-29 (×5): qty 1

## 2014-08-29 MED ORDER — LEVOTHYROXINE SODIUM 50 MCG PO TABS
50.0000 ug | ORAL_TABLET | Freq: Every day | ORAL | Status: DC
  Administered 2014-08-30 – 2014-09-02 (×4): 50 ug via ORAL
  Filled 2014-08-29 (×5): qty 1

## 2014-08-29 MED ORDER — METOCLOPRAMIDE HCL 5 MG/ML IJ SOLN
10.0000 mg | Freq: Three times a day (TID) | INTRAMUSCULAR | Status: DC
  Administered 2014-08-29 – 2014-08-31 (×6): 10 mg via INTRAVENOUS
  Filled 2014-08-29 (×9): qty 2

## 2014-08-29 MED ORDER — MECLIZINE HCL 25 MG PO TABS
25.0000 mg | ORAL_TABLET | Freq: Four times a day (QID) | ORAL | Status: DC | PRN
  Filled 2014-08-29: qty 1

## 2014-08-29 MED ORDER — INSULIN ASPART 100 UNIT/ML ~~LOC~~ SOLN
10.0000 [IU] | Freq: Once | SUBCUTANEOUS | Status: AC
  Administered 2014-08-29: 10 [IU] via INTRAVENOUS
  Filled 2014-08-29: qty 1

## 2014-08-29 MED ORDER — ONDANSETRON HCL 4 MG/2ML IJ SOLN
4.0000 mg | Freq: Once | INTRAMUSCULAR | Status: AC
  Administered 2014-08-29: 4 mg via INTRAVENOUS
  Filled 2014-08-29: qty 2

## 2014-08-29 MED ORDER — ONDANSETRON HCL 4 MG PO TABS
8.0000 mg | ORAL_TABLET | Freq: Three times a day (TID) | ORAL | Status: DC | PRN
  Administered 2014-08-31: 8 mg via ORAL
  Filled 2014-08-29: qty 2

## 2014-08-29 MED ORDER — LOSARTAN POTASSIUM 50 MG PO TABS
100.0000 mg | ORAL_TABLET | Freq: Every day | ORAL | Status: DC
  Administered 2014-08-29 – 2014-09-02 (×5): 100 mg via ORAL
  Filled 2014-08-29 (×5): qty 2

## 2014-08-29 MED ORDER — POTASSIUM CHLORIDE 10 MEQ/100ML IV SOLN
10.0000 meq | INTRAVENOUS | Status: DC
  Administered 2014-08-29: 10 meq via INTRAVENOUS
  Filled 2014-08-29 (×2): qty 100

## 2014-08-29 MED ORDER — OMEGA-3-ACID ETHYL ESTERS 1 G PO CAPS
1.0000 g | ORAL_CAPSULE | Freq: Every day | ORAL | Status: DC
  Administered 2014-08-29 – 2014-09-02 (×5): 1 g via ORAL
  Filled 2014-08-29 (×5): qty 1

## 2014-08-29 MED ORDER — ASPIRIN 81 MG PO CHEW
81.0000 mg | CHEWABLE_TABLET | Freq: Every day | ORAL | Status: DC
  Administered 2014-08-29 – 2014-09-02 (×5): 81 mg via ORAL
  Filled 2014-08-29 (×6): qty 1

## 2014-08-29 NOTE — ED Provider Notes (Signed)
CSN: [id number]     Arrival date & time 08/29/14  1308 History   First MD Initiated Contact with Patient 08/29/14 1502     Chief Complaint  Patient presents with  . Abdominal Pain  . Emesis     (Consider location/radiation/quality/duration/timing/severity/associated sxs/prior Treatment) HPI Comments: Patient with history of DM, gastroparesis, appendectomy -- presents with complaint of N/V x 1 week with associated generalized abdominal pain. Symptoms are similar to previous. She has been hospitalized with similar symptoms, most recently 04/2014. She reports fever to 102F several days ago. No URI symptoms, CP, cough, SOB, dysuria, skin rash, trauma. She reports increased urination. She has been vomiting multiple times a day. Not necessarily related to eating. Zofran and OTC nausea medication not helping. She was told by a PCP to come to ED today. The onset of this condition was acute. The course is constant. Aggravating factors: none. Alleviating factors: none.    Patient is a 73 y.o. female presenting with abdominal pain and vomiting. The history is provided by the patient and medical records.  Abdominal Pain Associated symptoms: fever, nausea and vomiting   Associated symptoms: no chest pain, no cough, no diarrhea, no dysuria, no shortness of breath and no sore throat   Emesis Associated symptoms: abdominal pain   Associated symptoms: no diarrhea, no headaches, no myalgias and no sore throat     Past Medical History  Diagnosis Date  . H. pylori infection 2008 and 1998    treated  . Multinodular goiter   . Hx of colonoscopy with polypectomy   . Depression   . Hyperlipidemia   . Hypertension   . Arthritis   . Urge urinary incontinence   . Diabetes mellitus, type II, insulin dependent   . Rotator cuff tear, left recurrent   . Bursitis of left shoulder    Past Surgical History  Procedure Laterality Date  . 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 Lipoma excision       RIGHT ELBOW  . Excision ganglion cyst left ring finger  01-17-2009  . Implantation permanent spinal cord stimulator  06-15-2008    JUNE 2013--  BATTERY CHANGE  . Foraminal decompression at l2 to the sacrum  01-05-2008    L2  -  S1  . Rotator cuff repair  03-28-2003    RIGHT SHOULDER  DEGENERATIVE AC JOINT AND RC TEAR  . Bladder neck suspension  1970'S  . Bilateral bunionectomies    . Hammertoe repair      BILATERAL  . Vaginal hysterectomy  1970'S  . Appendectomy  AGE 73  . Tonsillectomy  AGE 79  . Shoulder open rotator cuff repair  09/07/2012    Procedure: ROTATOR CUFF REPAIR SHOULDER OPEN;  Surgeon: Magnus Sinning, MD;  Location: [facility];  Service: Orthopedics;  Laterality: Left;  OPEN ANTERIOR ACROMIONECTOMY AND ROTATOR CUFF REPAIR ON LEFT   . Shoulder open rotator cuff repair Left 12/28/2012    Procedure: ROTATOR CUFF REPAIR SHOULDER OPEN;  Surgeon: Magnus Sinning, MD;  Location: [facility];  Service: Orthopedics;  Laterality: Left;  OPEN SHOULDER ROTATOR CUFF REPAIR ON LEFT  WITH ANTERIOR ACROMINECTOMY    Family History  Problem Relation Age of Onset  . Cancer Mother     breast  . Heart Problems Mother   . Muscular dystrophy Son   . Cancer Maternal Grandmother     breast  . Heart disease Maternal Grandfather   .  Prostate cancer Father   . Breast cancer Sister    History  Substance Use Topics  . Smoking status: Never Smoker   . Smokeless tobacco: Never Used  . Alcohol Use: No   OB History   Grav Para Term Preterm Abortions TAB SAB Ect Mult Living                 Review of Systems  Constitutional: Positive for fever.  HENT: Negative for rhinorrhea and sore throat.   Eyes: Negative for redness.  Respiratory: Negative for cough and shortness of breath.   Cardiovascular: Negative for chest pain and leg swelling.  Gastrointestinal: Positive for nausea, vomiting and abdominal pain. Negative for diarrhea and blood in stool.   Endocrine: Positive for polyuria. Negative for polydipsia.  Genitourinary: Negative for dysuria.  Musculoskeletal: Negative for myalgias.  Skin: Negative for rash.  Neurological: Negative for headaches.      Allergies  Rosiglitazone maleate; Elavil; Morphine; and Tramadol-acetaminophen  Home Medications   Prior to Admission medications   Medication Sig Start Date End Date Taking? Authorizing Provider  aspirin (BAYER CHILDRENS ASPIRIN) 81 MG chewable tablet Chew 81 mg by mouth daily.    Yes Historical Provider, MD  atorvastatin (LIPITOR) 20 MG tablet Take 20 mg by mouth at bedtime.   Yes Historical Provider, MD  FLUoxetine (PROZAC) 20 MG capsule Take 20 mg by mouth daily.   Yes Historical Provider, MD  insulin aspart (NOVOLOG) 100 UNIT/ML injection Inject 0-4 Units into the skin 3 (three) times daily as needed for high blood sugar. Sliding scale   Yes Historical Provider, MD  insulin glargine (LANTUS) 100 UNIT/ML injection Inject 2 Units into the skin 2 (two) times daily.   Yes Historical Provider, MD  levothyroxine (SYNTHROID, LEVOTHROID) 50 MCG tablet Take 50 mcg by mouth every morning.  08/24/12  Yes Clovis Cao, MD  losartan-hydrochlorothiazide (HYZAAR) 100-25 MG per tablet Take 1 tablet by mouth daily.   Yes Historical Provider, MD  meclizine (ANTIVERT) 25 MG tablet Take 25 mg by mouth every 6 (six) hours as needed for dizziness or nausea.   Yes Historical Provider, MD  Omega-3 Fatty Acids (FISH OIL) 1000 MG CAPS Take 1,000 mg by mouth 2 (two) times daily. OTC fish oil   Yes Historical Provider, MD  ondansetron (ZOFRAN) 8 MG tablet Take 8 mg by mouth every 8 (eight) hours as needed for nausea or vomiting.   Yes Historical Provider, MD   Pulse 105  SpO2 100% Physical Exam  Nursing note and vitals reviewed. Constitutional: She appears well-developed and well-nourished.  HENT:  Head: Normocephalic and atraumatic.  Mouth/Throat: Mucous membranes are dry.  Eyes: Conjunctivae are  normal. Right eye exhibits no discharge. Left eye exhibits no discharge.  Neck: Normal range of motion. Neck supple.  Cardiovascular: Regular rhythm and normal heart sounds.  Tachycardia present.   No murmur heard. Mild tachycardia  Pulmonary/Chest: Effort normal and breath sounds normal. No respiratory distress. She has no wheezes. She has no rales.  Abdominal: Soft. There is no tenderness.  Musculoskeletal: She exhibits no edema and no tenderness.  No LE edema.   Neurological: She is alert.  Skin: Skin is warm and dry.  Psychiatric: She has a normal mood and affect.    ED Course  Procedures (including critical care time) Labs Review Labs Reviewed  COMPREHENSIVE METABOLIC PANEL - Abnormal; Notable for the following:    Sodium 135 (*)    Potassium 3.0 (*)    Chloride 92 (*)  Glucose, Bld 351 (*)    GFR calc non Af Amer 88 (*)    Anion gap 19 (*)    All other components within normal limits  LIPASE, BLOOD - Abnormal; Notable for the following:    Lipase 10 (*)    All other components within normal limits  URINALYSIS, ROUTINE W REFLEX MICROSCOPIC - Abnormal; Notable for the following:    Glucose, UA >1000 (*)    Ketones, ur 15 (*)    All other components within normal limits  CBC WITH DIFFERENTIAL - Abnormal; Notable for the following:    MCHC 37.4 (*)    Platelets 120 (*)    Neutrophils Relative % 79 (*)    All other components within normal limits  CBG MONITORING, ED - Abnormal; Notable for the following:    Glucose-Capillary 353 (*)    All other components within normal limits  URINE MICROSCOPIC-ADD ON  CBC WITH DIFFERENTIAL  I-STAT TROPOININ, ED    Imaging Review No results found.   EKG Interpretation   Date/Time:  Tuesday August 29 2014 13:26:45 EDT Ventricular Rate:  76 PR Interval:  141 QRS Duration: 91 QT Interval:  396 QTC Calculation: 445 R Axis:   23 Text Interpretation:  Sinus rhythm Supraventricular bigeminy Nonspecific T  abnormalities,  lateral leads Minimal ST elevation, anterior leads Baseline  wander in lead(s) V3 V4 V6 No significant change since last tracing  Confirmed by Maryan Rued  MD, Loree Fee ([id number]) on 08/29/2014 3:26:45 PM      3:32 PM Patient seen and examined. Work-up initiated. Medications ordered. QTc normal today. Previous records show some improvement with Reglan. Pending completion of work-up. Mild gap, mild ketones in urine. Abd generally tender. CBC needed to be redrawn.   Vital signs reviewed and are as follows: Pulse 105  SpO2 100%  5:30 PM Discussed with Dr. Maryan Rued who will see. She has had additional episode of vomiting. Will likely need admit. IV insulin ordered. IV potassium ordered.   BP 196/89  Pulse 74  Resp 18  SpO2 100%  6:50 PM Spoke with Dr. Humphrey Rolls who will admit.   BP 176/74  Pulse 77  Temp(Src) 98.1 F (36.7 C) (Oral)  Resp 17  SpO2 100%    MDM   Final diagnoses:  Intractable vomiting with nausea, vomiting of unspecified type  Hyperglycemia   Admit. Doubt DKA, sx more likely related to gastroparesis.    Carlisle Cater, PA-C 08/29/14 [phone]

## 2014-08-29 NOTE — ED Notes (Signed)
Pt c/o intermittent upper abdominal pain and emesis x 1 week.  Pain score 8/10.  Pt reports similar symptoms previously and thinks she was diagnosed w/ pancreatitis.

## 2014-08-29 NOTE — ED Notes (Signed)
Pt. Is unable to use the restroom at this time, but is aware that we need a urine specimen.  

## 2014-08-29 NOTE — H&P (Signed)
[facility] History and Physical  Sara Graves [id number] DOB: [birth year] DOA: 08/29/2014  Referring physician: Carlisle Cater PA PCP: [name], PA-C   Chief Complaint: Vomiting  HPI: Sara Graves is a 73 y.o. female history of longstanding diabetes presents with nausea and vomiting and uncontrolled diabetes. She states that she has been sick for about a week. Patient has had inability to keep food down. She states that she has had no medications also. She has not taken any insulin for her diabetes. Patient also reports a fever. She has no chills noted. She has no cough noted. She has urine discomfort noted. She has no abdominal pain. She had taken some zofran and this had not helped and was told by her PCP to come to the ED. She is now comfortable has had no vomiting in the ED noted.    Review of Systems:  Constitutional:  No weight loss, night sweats, Fevers, chills, fatigue.  HEENT:  No headaches, Difficulty swallowing  Cardio-vascular:  No chest pain, Orthopnea, PND, swelling in lower extremities  GI:  No heartburn, indigestion, ++abdominal pain, ++nausea, ++vomiting, ++diarrhea, ++loss of appetite  Resp:  No shortness of breath with exertion or at rest. No excess mucus, no productive cough, No non-productive cough, No coughing up of blood  Skin:  no rash or lesions GU:  no dysuria, change in color of urine. No flank pain.  Musculoskeletal:  No joint pain or swelling. No decreased range of motion.  Psych:  No change in mood or affect. No depression or anxiety.    Past Medical History  Diagnosis Date  . H. pylori infection 2008 and 1998    treated  . Multinodular goiter   . Hx of colonoscopy with polypectomy   . Depression   . Hyperlipidemia   . Hypertension   . Arthritis   . Urge urinary incontinence   . Diabetes mellitus, type II, insulin dependent   . Rotator cuff tear, left recurrent   . Bursitis of left shoulder    Past Surgical History    Procedure Laterality Date  . 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 Lipoma excision      RIGHT ELBOW  . Excision ganglion cyst left ring finger  01-17-2009  . Implantation permanent spinal cord stimulator  06-15-2008    JUNE 2013--  BATTERY CHANGE  . Foraminal decompression at l2 to the sacrum  01-05-2008    L2  -  S1  . Rotator cuff repair  03-28-2003    RIGHT SHOULDER  DEGENERATIVE AC JOINT AND RC TEAR  . Bladder neck suspension  1970'S  . Bilateral bunionectomies    . Hammertoe repair      BILATERAL  . Vaginal hysterectomy  1970'S  . Appendectomy  AGE 59  . Tonsillectomy  AGE 64  . Shoulder open rotator cuff repair  09/07/2012    Procedure: ROTATOR CUFF REPAIR SHOULDER OPEN;  Surgeon: Magnus Sinning, MD;  Location: [place];  Service: Orthopedics;  Laterality: Left;  OPEN ANTERIOR ACROMIONECTOMY AND ROTATOR CUFF REPAIR ON LEFT   . Shoulder open rotator cuff repair Left 12/28/2012    Procedure: ROTATOR CUFF REPAIR SHOULDER OPEN;  Surgeon: Magnus Sinning, MD;  Location: [place];  Service: Orthopedics;  Laterality: Left;  OPEN SHOULDER ROTATOR CUFF REPAIR ON LEFT  WITH ANTERIOR ACROMINECTOMY    Social History:  reports that she has never smoked. She has never used smokeless  tobacco. She reports that she does not drink alcohol or use illicit drugs.  Allergies  Allergen Reactions  . Rosiglitazone Maleate Anaphylaxis and Swelling      (ANTIDIABETIC MED.) All over body   . Elavil [Amitriptyline] Nausea Only  . Morphine Other (See Comments)    SEVERE HYPOTENSION   . Tramadol-Acetaminophen Rash    Family History  Problem Relation Age of Onset  . Cancer Mother     breast  . Heart Problems Mother   . Muscular dystrophy Son   . Cancer Maternal Grandmother     breast  . Heart disease Maternal Grandfather   . Prostate cancer Father   . Breast cancer Sister      Prior to Admission medications   Medication Sig Start  Date End Date Taking? Authorizing Provider  aspirin ([employer] ASPIRIN) 81 MG chewable tablet Chew 81 mg by mouth daily.    Yes Historical Provider, MD  atorvastatin (LIPITOR) 20 MG tablet Take 20 mg by mouth at bedtime.   Yes Historical Provider, MD  FLUoxetine (PROZAC) 20 MG capsule Take 20 mg by mouth daily.   Yes Historical Provider, MD  insulin aspart (NOVOLOG) 100 UNIT/ML injection Inject 0-4 Units into the skin 3 (three) times daily as needed for high blood sugar. Sliding scale   Yes Historical Provider, MD  insulin glargine (LANTUS) 100 UNIT/ML injection Inject 2 Units into the skin 2 (two) times daily.   Yes Historical Provider, MD  levothyroxine (SYNTHROID, LEVOTHROID) 50 MCG tablet Take 50 mcg by mouth every morning.  08/24/12  Yes Clovis Cao, MD  losartan-hydrochlorothiazide (HYZAAR) 100-25 MG per tablet Take 1 tablet by mouth daily.   Yes Historical Provider, MD  meclizine (ANTIVERT) 25 MG tablet Take 25 mg by mouth every 6 (six) hours as needed for dizziness or nausea.   Yes Historical Provider, MD  Omega-3 Fatty Acids (FISH OIL) 1000 MG CAPS Take 1,000 mg by mouth 2 (two) times daily. OTC fish oil   Yes Historical Provider, MD  ondansetron (ZOFRAN) 8 MG tablet Take 8 mg by mouth every 8 (eight) hours as needed for nausea or vomiting.   Yes Historical Provider, MD   Physical Exam: Filed Vitals:   08/29/14 1541 08/29/14 1734 08/29/14 1800 08/29/14 1900  BP: 196/89 176/74 155/51 184/64  Pulse: 74 77 82 75  Temp:  98.1 F (36.7 C)    TempSrc:  Oral    Resp: 18 17 14  0  SpO2: 100% 100% 94% 95%    Wt Readings from Last 3 Encounters:  05/15/14 58.333 kg (128 lb 9.6 oz)  05/08/14 57.425 kg (126 lb 9.6 oz)  05/03/14 59.875 kg (132 lb)    General:  Appears calm and comfortable Eyes: PERRL, normal lids, irises & conjunctiva ENT: grossly normal hearing, lips & tongue Neck: no LAD, masses or thyromegaly Cardiovascular: RRR, no m/r/g. No LE edema. Respiratory: CTA  bilaterally, no w/r/r. Normal respiratory effort. Abdomen: soft, ntnd Skin: no rash or induration seen on limited exam Musculoskeletal: grossly normal tone BUE/BLE Psychiatric: grossly normal mood and affect, speech fluent and appropriate Neurologic: grossly non-focal.          Labs on Admission:  Basic Metabolic Panel:  Recent Labs Lab 08/29/14 1328  NA 135*  K 3.0*  CL 92*  CO2 24  GLUCOSE 351*  BUN 22  CREATININE 0.60  CALCIUM 9.8   Liver Function Tests:  Recent Labs Lab 08/29/14 1328  AST 13  ALT 12  ALKPHOS  76  BILITOT 1.2  PROT 7.5  ALBUMIN 4.4    Recent Labs Lab 08/29/14 1328  LIPASE 10*   No results found for this basename: AMMONIA,  in the last 168 hours CBC:  Recent Labs Lab 08/29/14 1529  WBC 4.6  NEUTROABS 3.7  HGB 14.4  HCT 38.5  MCV 86.5  PLT 120*   Cardiac Enzymes: No results found for this basename: CKTOTAL, CKMB, CKMBINDEX, TROPONINI,  in the last 168 hours  BNP (last 3 results) No results found for this basename: PROBNP,  in the last 8760 hours CBG:  Recent Labs Lab 08/29/14 1340 08/29/14 1854  GLUCAP 353* 289*    Radiological Exams on Admission: Dg Abd Acute W/chest  08/29/2014   CLINICAL DATA:  Nausea, vomiting for 1 week, upper and lower abdominal pain  EXAM: ACUTE ABDOMEN SERIES (ABDOMEN 2 VIEW & CHEST 1 VIEW)  COMPARISON:  None.  FINDINGS: There is no evidence of dilated bowel loops or free intraperitoneal air. No radiopaque calculi or other significant radiographic abnormality is seen. Heart size and mediastinal contours are within normal limits. Both lungs are clear.  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 There is a spinal stimulator present.  IMPRESSION: Negative abdominal radiographs.  No acute cardiopulmonary disease.   Electronically Signed   By: Kathreen Devoid   On: 08/29/2014 16:24     Assessment/Plan Active Problems:   Hypothyroidism   DM (diabetes mellitus), type 2, uncontrolled   HYPERTENSION, BENIGN  SYSTEMIC   Intractable nausea and vomiting   1. Diabetes Mellitus Uncontrolled -likely from not eating and not taking her medications -will re-start her Insulin as ordered -hydrate with fluids -start on clear diet to see how she tolerates  2. Hypertension -will continue with home medications -pressure elevated likely related to not taking medications  3. Hypothyroid -will check TSH -continue with home medications  4. Intractable Nausea and Vomiting -has not vomited since here in the ED -will reassess daily as needed -antiemetics as needed  5. Hypokalemia -will give Klor now -recheck labs in AM  6. Hyponatremia -hydrate with IVF NS -will repeat labs in am    Code Status: Full Code (must indicate code status--if unknown or must be presumed, indicate so) DVT Prophylaxis:Heparin Family Communication: None (indicate person spoken with, if applicable, with phone number if by telephone) Disposition Plan: Home (indicate anticipated LOS)  Time spent: 59min  Lorma Heater A [facility] Pager [phone]

## 2014-08-29 NOTE — ED Provider Notes (Signed)
Medical screening examination/treatment/procedure(s) were conducted as a shared visit with non-physician practitioner(s) and myself.  I personally evaluated the patient during the encounter.   EKG Interpretation   Date/Time:  Tuesday August 29 2014 13:26:45 EDT Ventricular Rate:  76 PR Interval:  141 QRS Duration: 91 QT Interval:  396 QTC Calculation: 445 R Axis:   23 Text Interpretation:  Sinus rhythm Supraventricular bigeminy Nonspecific T  abnormalities, lateral leads Minimal ST elevation, anterior leads Baseline  wander in lead(s) V3 V4 V6 No significant change since last tracing  Confirmed by Osbaldo Mark  MD, Darinda [place] ([id number]) on 08/29/2014 3:26:45 PM      Pt with recurrent N/V and elevated BS.  Pt has not been using insulin because she thinks it makes her sick.  She has persistent pain in the epigastrium and over the bladder. She denies fever or resp/cardiac complaints.  Persistent n/v and admitted for gastroparesis.  Blanchie Dessert, MD 08/29/14 2358

## 2014-08-30 DIAGNOSIS — E1165 Type 2 diabetes mellitus with hyperglycemia: Secondary | ICD-10-CM

## 2014-08-30 DIAGNOSIS — E876 Hypokalemia: Secondary | ICD-10-CM

## 2014-08-30 DIAGNOSIS — I10 Essential (primary) hypertension: Secondary | ICD-10-CM

## 2014-08-30 DIAGNOSIS — E1143 Type 2 diabetes mellitus with diabetic autonomic (poly)neuropathy: Secondary | ICD-10-CM | POA: Diagnosis not present

## 2014-08-30 LAB — HEMOGLOBIN A1C
HEMOGLOBIN A1C: 9 % — AB (ref <5.7)
Mean Plasma Glucose: 212 mg/dL — ABNORMAL HIGH (ref <117)

## 2014-08-30 LAB — BASIC METABOLIC PANEL
ANION GAP: 12 (ref 5–15)
BUN: 9 mg/dL (ref 6–23)
CHLORIDE: 99 meq/L (ref 96–112)
CO2: 29 mEq/L (ref 19–32)
CREATININE: 0.57 mg/dL (ref 0.50–1.10)
Calcium: 9.3 mg/dL (ref 8.4–10.5)
GFR calc Af Amer: >90 mL/min (ref >90)
GFR calc non Af Amer: 90 mL/min — ABNORMAL LOW (ref >90)
Glucose, Bld: 131 mg/dL — ABNORMAL HIGH (ref 70–99)
Potassium: 3.3 mEq/L — ABNORMAL LOW (ref 3.7–5.3)
SODIUM: 140 meq/L (ref 137–147)

## 2014-08-30 LAB — GLUCOSE, CAPILLARY
GLUCOSE-CAPILLARY: 131 mg/dL — AB (ref 70–99)
Glucose-Capillary: 181 mg/dL — ABNORMAL HIGH (ref 70–99)
Glucose-Capillary: 135 mg/dL — ABNORMAL HIGH (ref 70–99)
Glucose-Capillary: 154 mg/dL — ABNORMAL HIGH (ref 70–99)

## 2014-08-30 LAB — MAGNESIUM: MAGNESIUM: 1.6 mg/dL (ref 1.5–2.5)

## 2014-08-30 MED ORDER — CHLORHEXIDINE GLUCONATE 0.12 % MT SOLN
15.0000 mL | Freq: Two times a day (BID) | OROMUCOSAL | Status: DC
  Administered 2014-08-30 – 2014-09-02 (×7): 15 mL via OROMUCOSAL
  Filled 2014-08-30 (×10): qty 15

## 2014-08-30 MED ORDER — CETYLPYRIDINIUM CHLORIDE 0.05 % MT LIQD
7.0000 mL | Freq: Two times a day (BID) | OROMUCOSAL | Status: DC
  Administered 2014-08-30 – 2014-09-01 (×3): 7 mL via OROMUCOSAL

## 2014-08-30 MED ORDER — ENOXAPARIN SODIUM 40 MG/0.4ML ~~LOC~~ SOLN
40.0000 mg | SUBCUTANEOUS | Status: DC
  Administered 2014-08-30 – 2014-09-01 (×3): 40 mg via SUBCUTANEOUS
  Filled 2014-08-30 (×4): qty 0.4

## 2014-08-30 NOTE — Progress Notes (Signed)
Inpatient Diabetes Program Recommendations  AACE/ADA: New Consensus Statement on Inpatient Glycemic Control (2013)  Target Ranges:  Prepandial:   less than 140 mg/dL      Peak postprandial:   less than 180 mg/dL (1-2 hours)      Critically ill patients:  140 - 180 mg/dL     Results for Sara Graves, Sara Graves (MRN [id number]) as of 08/30/2014 16:22  Ref. Range 08/30/2014 07:46 08/30/2014 11:59  Glucose-Capillary Latest Range: 70-99 mg/dL 131 (H) 181 (H)    Results for Sara Graves, Sara Graves (MRN [id number]) as of 08/30/2014 16:22  Ref. Range 04/06/2014 09:34 08/29/2014 20:29  Hemoglobin A1C Latest Range: <5.7 % 10.5 9.0 (H)     Spoke to patient about her current A1c of 9%.  Explained what an A1c is and what it measures.  Reminded patient that her goal A1c is 7% or less per ADA standards to prevent both acute and long-term complications.  Encouraged patient to check her CBGs at least tid and to record all CBGs in a logbook for her PCP to review.  Patient told me she sees Dr. Roe Coombs with [facility] for DM management.  Has an appointment with Dr. Frederico Hamman next month.    Patient told me she has NOT been taking her Lantus and Novolog on a regular basis due to illness.  Discussed the importance of taking insulin regularly and the importance of good CBG control.  Patient stated she has a glucometer at home and has access to insulin and supplies.  CBGs well controlled so far here in hospital on Lantus 2 units bid + Novolog Moderate SSI.     Will follow Wyn Quaker RN, MSN, CDE Diabetes Coordinator Inpatient Diabetes Program Team Pager: [phone] (8a-10p)

## 2014-08-30 NOTE — Care Management Note (Signed)
    Page 1 of 2   08/30/2014     6:40:48 PM CARE MANAGEMENT NOTE 08/30/2014  Patient:  Sara Graves, Sara Graves   Account Number:  [id number]  Date Initiated:  08/30/2014  Documentation initiated by:  Thurlow Gallaga  Subjective/Objective Assessment:   diabetic patient with nausea and vomting unable to take meds     Action/Plan:   home when stable   Anticipated DC Date:  09/02/2014   Anticipated DC Plan:  HOME/SELF CARE  In-house referral  NA      DC Planning Services  NA      [facility] Choice  NA   Choice offered to / List presented to:  NA   DME arranged  NA      DME agency  NA     [place] arranged  NA      [place] agency  NA   Status of service:  In process, will continue to follow Medicare Important Message given?   (If response is "NO", the following Medicare IM given date fields will be blank) Date Medicare IM given:   Medicare IM given by:   Date Additional Medicare IM given:   Additional Medicare IM given by:    Discharge Disposition:    Per UR Regulation:  Reviewed for med. necessity/level of care/duration of stay  If discussed at [place] of Stay Meetings, dates discussed:    Comments:  [id number]/[name] Rosana Hoes, RN, BSN, CCM Chart reviewed. Discharge needs and patient's stay to be reviewed and followed by case manager.

## 2014-08-30 NOTE — Progress Notes (Signed)
Sara Graves [id number] DOB: [birth year] DOA: 08/29/2014 PCP: [name], PA-C  Assessment/Plan: Recurrent vomiting -Likely due to gastritis with a component of diabetic gastroparesis -Clinically improving without any vomiting since admission -Continue IV fluids -Advance diet to full liquids -Further workup if the patient Has increased abdominal pain and recurrent vomiting -Urinalysis negative for pyuria -LFTs and lipase normal  -Abdominal pain resolved  Diabetes mellitus type 2, uncontrolled -CBGs fairly controlled, but expect increased CBGs as diet is advanced -Continue home dose Lantus -NovoLog sliding scale -08/29/2014 hemoglobin A1c 9.0 Hypokalemia  -Secondary to GI loss  -Replete  -Check magnesium  Thrombocytopenia  -Appears chronic  -Continue to trend  -No bleeding complications presently  Hypertension  -Stable  -Continue losartan -hold HCTZ Hypothyroidism  -Continue Synthroid   Family Communication:   Pt at beside Disposition Plan:   Home when medically stable       Procedures/Studies: Dg Abd Acute W/chest  08/29/2014   CLINICAL DATA:  Nausea, vomiting for 1 week, upper and lower abdominal pain  EXAM: ACUTE ABDOMEN SERIES (ABDOMEN 2 VIEW & CHEST 1 VIEW)  COMPARISON:  None.  FINDINGS: There is no evidence of dilated bowel loops or free intraperitoneal air. No radiopaque calculi or other significant radiographic abnormality is seen. Heart size and mediastinal contours are within normal limits. Both lungs are clear.  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 There is a spinal stimulator present.  IMPRESSION: Negative abdominal radiographs.  No acute cardiopulmonary disease.   Electronically Signed   By: Kathreen Devoid   On: 08/29/2014 16:24         Subjective: Patient is feeling better. No further abdominal pain, nausea, vomiting. She denies any fevers, chills, dizziness, chest pain, shortness breath, dysuria, hematuria.    Objective: Filed Vitals:   08/29/14 1900 08/29/14 2003 08/29/14 2138 08/30/14 1424  BP: 184/64 149/57 137/51 151/66  Pulse: 75 79 67 69  Temp:  98.3 F (36.8 C) 98.2 F (36.8 C) 98.4 F (36.9 C)  TempSrc:  Oral Oral Oral  Resp: 0 16 16 16   Height:  5\' 3"  (1.6 m)    Weight:  60.8 kg (134 lb 0.6 oz)    SpO2: 95% 99% 99% 100%    Intake/Output Summary (Last 24 hours) at 08/30/14 1431 Last data filed at 08/30/14 0700  Gross per 24 hour  Intake   1015 ml  Output      2 ml  Net   1013 ml   Weight change:  Exam:   General:  Pt is alert, follows commands appropriately, not in acute distress  HEENT: No icterus, No thrush,[place]/AT  Cardiovascular: RRR, S1/S2, no rubs, no gallops  Respiratory: CTA bilaterally, no wheezing, no crackles, no rhonchi  Abdomen: Soft/+BS, non tender, non distended, no guarding  Extremities: No edema, No lymphangitis, No petechiae, No rashes, no synovitis  Data Reviewed: Basic Metabolic Panel:  Recent Labs Lab 08/29/14 1328  NA 135*  K 3.0*  CL 92*  CO2 24  GLUCOSE 351*  BUN 22  CREATININE 0.60  CALCIUM 9.8   Liver Function Tests:  Recent Labs Lab 08/29/14 1328  AST 13  ALT 12  ALKPHOS 76  BILITOT 1.2  PROT 7.5  ALBUMIN 4.4    Recent Labs Lab 08/29/14 1328  LIPASE 10*   No results found for this basename: AMMONIA,  in the last 168 hours CBC:  Recent Labs Lab 08/29/14 1529  WBC 4.6  NEUTROABS 3.7  HGB 14.4  HCT 38.5  MCV 86.5  PLT 120*   Cardiac Enzymes: No results found for this basename: CKTOTAL, CKMB, CKMBINDEX, TROPONINI,  in the last 168 hours BNP: No components found with this basename: POCBNP,  CBG:  Recent Labs Lab 08/29/14 1340 08/29/14 1854 08/29/14 2136 08/30/14 0746 08/30/14 1159  GLUCAP 353* 289* 166* 131* 181*    No results found for this or any previous visit (from the past 240 hour(s)).   Scheduled Meds: . antiseptic oral rinse  7 mL Mouth Rinse q12n4p  . aspirin  81 mg Oral Daily   . atorvastatin  20 mg Oral QHS  . chlorhexidine  15 mL Mouth Rinse BID  . FLUoxetine  20 mg Oral Daily  . losartan  100 mg Oral Daily   And  . hydrochlorothiazide  25 mg Oral Daily  . insulin aspart  0-15 Units Subcutaneous TID WC  . insulin glargine  2 Units Subcutaneous BID  . levothyroxine  50 mcg Oral QAC breakfast  . metoCLOPramide (REGLAN) injection  10 mg Intravenous 3 times per day  . omega-3 acid ethyl esters  1 g Oral Daily   Continuous Infusions:    Danette Weinfeld, DO  [facility] Pager [phone]  If 7PM-7AM, please contact night-coverage [email or web address] Password [email or web address] 08/30/2014, 2:31 PM   LOS: 1 day

## 2014-08-31 ENCOUNTER — Inpatient Hospital Stay (HOSPITAL_COMMUNITY): Payer: Medicare Other

## 2014-08-31 DIAGNOSIS — D696 Thrombocytopenia, unspecified: Secondary | ICD-10-CM

## 2014-08-31 DIAGNOSIS — E039 Hypothyroidism, unspecified: Secondary | ICD-10-CM

## 2014-08-31 LAB — GLUCOSE, CAPILLARY
Glucose-Capillary: 161 mg/dL — ABNORMAL HIGH (ref 70–99)
Glucose-Capillary: 128 mg/dL — ABNORMAL HIGH (ref 70–99)
Glucose-Capillary: 146 mg/dL — ABNORMAL HIGH (ref 70–99)
Glucose-Capillary: 181 mg/dL — ABNORMAL HIGH (ref 70–99)

## 2014-08-31 LAB — BASIC METABOLIC PANEL
Anion gap: 13 (ref 5–15)
BUN: 8 mg/dL (ref 6–23)
CO2: 30 meq/L (ref 19–32)
CREATININE: 0.63 mg/dL (ref 0.50–1.10)
Calcium: 9 mg/dL (ref 8.4–10.5)
Chloride: 95 mEq/L — ABNORMAL LOW (ref 96–112)
GFR calc Af Amer: >90 mL/min (ref >90)
GFR calc non Af Amer: 87 mL/min — ABNORMAL LOW (ref >90)
Glucose, Bld: 142 mg/dL — ABNORMAL HIGH (ref 70–99)
Potassium: 2.8 mEq/L — CL (ref 3.7–5.3)
Sodium: 138 mEq/L (ref 137–147)

## 2014-08-31 LAB — HEPATIC FUNCTION PANEL
ALBUMIN: 3.3 g/dL — AB (ref 3.5–5.2)
ALT: 8 U/L (ref 0–35)
AST: 11 U/L (ref 0–37)
Alkaline Phosphatase: 60 U/L (ref 39–117)
Bilirubin, Direct: <0.2 mg/dL (ref 0.0–0.3)
Total Bilirubin: 1.1 mg/dL (ref 0.3–1.2)
Total Protein: 6.2 g/dL (ref 6.0–8.3)

## 2014-08-31 MED ORDER — POTASSIUM CHLORIDE CRYS ER 20 MEQ PO TBCR
30.0000 meq | EXTENDED_RELEASE_TABLET | ORAL | Status: DC
  Administered 2014-08-31: 30 meq via ORAL
  Filled 2014-08-31 (×2): qty 1

## 2014-08-31 MED ORDER — METOCLOPRAMIDE HCL 5 MG/ML IJ SOLN
10.0000 mg | Freq: Three times a day (TID) | INTRAMUSCULAR | Status: DC
  Administered 2014-08-31 – 2014-09-02 (×5): 10 mg via INTRAVENOUS
  Filled 2014-08-31 (×7): qty 2

## 2014-08-31 MED ORDER — POTASSIUM CHLORIDE 10 MEQ/100ML IV SOLN
10.0000 meq | INTRAVENOUS | Status: AC
  Administered 2014-08-31 (×4): 10 meq via INTRAVENOUS
  Filled 2014-08-31 (×4): qty 100

## 2014-08-31 MED ORDER — SODIUM CHLORIDE 0.9 % IV SOLN
INTRAVENOUS | Status: DC
  Administered 2014-08-31 – 2014-09-01 (×2): via INTRAVENOUS
  Filled 2014-08-31 (×7): qty 1000

## 2014-08-31 MED ORDER — OXYCODONE HCL 5 MG PO TABS
5.0000 mg | ORAL_TABLET | ORAL | Status: DC | PRN
  Administered 2014-08-31 – 2014-09-01 (×3): 5 mg via ORAL
  Filled 2014-08-31 (×3): qty 1

## 2014-08-31 MED ORDER — ONDANSETRON HCL 4 MG/2ML IJ SOLN
4.0000 mg | Freq: Four times a day (QID) | INTRAMUSCULAR | Status: DC | PRN
  Filled 2014-08-31: qty 2

## 2014-08-31 MED ORDER — FENTANYL CITRATE 0.05 MG/ML IJ SOLN
12.5000 ug | Freq: Once | INTRAMUSCULAR | Status: AC
  Administered 2014-08-31: 12.5 ug via INTRAVENOUS
  Filled 2014-08-31: qty 2

## 2014-08-31 MED ORDER — PANTOPRAZOLE SODIUM 40 MG IV SOLR
40.0000 mg | INTRAVENOUS | Status: DC
  Administered 2014-08-31 – 2014-09-01 (×2): 40 mg via INTRAVENOUS
  Filled 2014-08-31 (×3): qty 40

## 2014-08-31 NOTE — Progress Notes (Signed)
PROGRESS NOTE  Sara Graves [id number] DOB: January 25, 1941 DOA: 08/29/2014 PCP: [name], PA-C  Assessment/Plan: Recurrent vomiting  -Likely due to gastritis with a component of diabetic gastroparesis  -Clinically improving without any vomiting since admission  -Continue IV fluids  -08/31/2014 Patient endorsed abdominal pain and increased nausea -Abdominal ultrasound -Urinalysis negative for pyuria  -LFTs and lipase normal at the time of admission Diabetes mellitus type 2, uncontrolled  -CBGs controlled, but expect increased CBGs as diet is advanced  -Continue home dose Lantus  -NovoLog sliding scale  -08/29/2014 hemoglobin A1c 9.0  Hypokalemia  -Secondary to GI loss  -Replete  -Check magnesium--1.6 Thrombocytopenia  -Appears chronic  -Continue to trend  -No bleeding complications presently  Hypertension  -Stable  -Continue losartan  -hold HCTZ  Hypothyroidism  -Continue Synthroid    Family Communication:   Pt at beside Disposition Plan:   Home when medically stable      Procedures/Studies: Dg Abd Acute W/chest  08/29/2014   CLINICAL DATA:  Nausea, vomiting for 1 week, upper and lower abdominal pain  EXAM: ACUTE ABDOMEN SERIES (ABDOMEN 2 VIEW & CHEST 1 VIEW)  COMPARISON:  None.  FINDINGS: There is no evidence of dilated bowel loops or free intraperitoneal air. No radiopaque calculi or other significant radiographic abnormality is seen. Heart size and mediastinal contours are within normal limits. Both lungs are clear.  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 There is a spinal stimulator present.  IMPRESSION: Negative abdominal radiographs.  No acute cardiopulmonary disease.   Electronically Signed   By: Kathreen Devoid   On: 08/29/2014 16:24         Subjective: Patient complaint of increased nausea with epigastric discomfort. Denies any fevers, chills, chest pain, shortness breath, vomiting, diarrhea, hematochezia, rashes,  dizziness.  Objective: Filed Vitals:   08/29/14 2138 08/30/14 1424 08/30/14 2107 08/31/14 0543  BP: 137/51 151/66 133/63 122/56  Pulse: 67 69 70 69  Temp: 98.2 F (36.8 C) 98.4 F (36.9 C) 98.7 F (37.1 C) 97.9 F (36.6 C)  TempSrc: Oral Oral Oral Oral  Resp: 16 16 20 18   Height:      Weight:      SpO2: 99% 100% 97% 97%    Intake/Output Summary (Last 24 hours) at 08/31/14 0815 Last data filed at 08/30/14 1800  Gross per 24 hour  Intake    440 ml  Output      0 ml  Net    440 ml   Weight change:  Exam:   General:  Pt is alert, follows commands appropriately, not in acute distress  HEENT: No icterus, No thrush,[place]/AT  Cardiovascular: RRR, S1/S2, no rubs, no gallops  Respiratory: CTA bilaterally, no wheezing, no crackles, no rhonchi  Abdomen: Soft/+BS, epigastric tenderness without any rebound, non distended, no guarding  Extremities: No edema, No lymphangitis, No petechiae, No rashes, no synovitis  Data Reviewed: Basic Metabolic Panel:  Recent Labs Lab 08/29/14 1328 08/30/14 1518 08/31/14 0436  NA 135* 140 138  K 3.0* 3.3* 2.8*  CL 92* 99 95*  CO2 24 29 30   GLUCOSE 351* 131* 142*  BUN 22 9 8   CREATININE 0.60 0.57 0.63  CALCIUM 9.8 9.3 9.0  MG  --  1.6  --    Liver Function Tests:  Recent Labs Lab 08/29/14 1328  AST 13  ALT 12  ALKPHOS 76  BILITOT 1.2  PROT 7.5  ALBUMIN 4.4    Recent Labs Lab 08/29/14  1328  LIPASE 10*   No results found for this basename: AMMONIA,  in the last 168 hours CBC:  Recent Labs Lab 08/29/14 1529  WBC 4.6  NEUTROABS 3.7  HGB 14.4  HCT 38.5  MCV 86.5  PLT 120*   Cardiac Enzymes: No results found for this basename: CKTOTAL, CKMB, CKMBINDEX, TROPONINI,  in the last 168 hours BNP: No components found with this basename: POCBNP,  CBG:  Recent Labs Lab 08/30/14 0746 08/30/14 1159 08/30/14 1638 08/30/14 2106 08/31/14 0747  GLUCAP 131* 181* 135* 154* 161*    No results found for this or any  previous visit (from the past 240 hour(s)).   Scheduled Meds: . antiseptic oral rinse  7 mL Mouth Rinse q12n4p  . aspirin  81 mg Oral Daily  . atorvastatin  20 mg Oral QHS  . chlorhexidine  15 mL Mouth Rinse BID  . enoxaparin (LOVENOX) injection  40 mg Subcutaneous Q24H  . FLUoxetine  20 mg Oral Daily  . insulin aspart  0-15 Units Subcutaneous TID WC  . insulin glargine  2 Units Subcutaneous BID  . levothyroxine  50 mcg Oral QAC breakfast  . losartan  100 mg Oral Daily  . metoCLOPramide (REGLAN) injection  10 mg Intravenous 3 times per day  . omega-3 acid ethyl esters  1 g Oral Daily  . potassium chloride  30 mEq Oral Q4H   Continuous Infusions:    Ellyssa Zagal, DO  [facility] Pager [phone]  If 7PM-7AM, please contact night-coverage [email or web address] Password [email or web address] 08/31/2014, 8:15 AM   LOS: 2 days

## 2014-08-31 NOTE — Progress Notes (Signed)
CRITICAL VALUE ALERT  Critical value received:  K+ 2.8  Date of notification:  10/15  Time of notification:  0605  Critical value read back:Yes.    Nurse who received alert:  Corvin Sorbo RN  MD notified (1st page):  Baltazar Najjar  Time of first page:  0607  MD notified (2nd page):  Time of second page:  Responding MD:    Time MD responded:

## 2014-09-01 DIAGNOSIS — R1013 Epigastric pain: Secondary | ICD-10-CM

## 2014-09-01 LAB — GLUCOSE, CAPILLARY
GLUCOSE-CAPILLARY: 161 mg/dL — AB (ref 70–99)
GLUCOSE-CAPILLARY: 155 mg/dL — AB (ref 70–99)
GLUCOSE-CAPILLARY: 194 mg/dL — AB (ref 70–99)
GLUCOSE-CAPILLARY: 147 mg/dL — AB (ref 70–99)

## 2014-09-01 LAB — BASIC METABOLIC PANEL
ANION GAP: 13 (ref 5–15)
BUN: 8 mg/dL (ref 6–23)
CO2: 27 mEq/L (ref 19–32)
Calcium: 8.9 mg/dL (ref 8.4–10.5)
Chloride: 99 mEq/L (ref 96–112)
Creatinine, Ser: 0.56 mg/dL (ref 0.50–1.10)
GFR calc non Af Amer: >90 mL/min (ref >90)
Glucose, Bld: 143 mg/dL — ABNORMAL HIGH (ref 70–99)
POTASSIUM: 3.7 meq/L (ref 3.7–5.3)
SODIUM: 139 meq/L (ref 137–147)

## 2014-09-01 MED ORDER — BISACODYL 10 MG RE SUPP
10.0000 mg | Freq: Once | RECTAL | Status: AC
  Administered 2014-09-01: 10 mg via RECTAL
  Filled 2014-09-01: qty 1

## 2014-09-01 NOTE — Progress Notes (Signed)
PROGRESS NOTE  Sara Graves [id number] DOB: December 22, 1940 DOA: 08/29/2014 PCP: [name], PA-C  Assessment/Plan: Recurrent vomiting  -Likely due to gastritis with a component of diabetic gastroparesis -pt was also taking Aleve daily x 3 weeks prior to admission  -previously improving without any vomiting since admission but small setback on 08/31/14 -Continue IV fluids  -08/31/2014 Patient endorsed abdominal pain and increased nausea  -10/15--Abdominal ultrasound--negative -10/15--AXR--neg -10/16--clinically improving again after back to clears x 24hrs-->advance to full liquid -Urinalysis negative for pyuria  -LFTs and lipase normal at the time of admission  Diabetes mellitus type 2, uncontrolled  -CBGs controlled, but expect increased CBGs as diet is advanced  -Continue home dose Lantus  -NovoLog sliding scale  -08/29/2014 hemoglobin A1c 9.0  Hypokalemia  -Secondary to GI loss  -Replete  -Check magnesium--1.6  Thrombocytopenia  -Appears chronic  -Continue to trend  -No bleeding complications presently  Hypertension  -Stable  -Continue losartan  -hold HCTZ  Hypothyroidism  -Continue Synthroid  Family Communication: Pt at beside  Disposition Plan: Home when medically stable        Procedures/Studies: US Abdomen Complete  08/31/2014   CLINICAL DATA:  Recurrent vomiting.  Abdominal pain .  EXAM: ULTRASOUND ABDOMEN COMPLETE  COMPARISON:  08/29/2014.  FINDINGS: Gallbladder: No gallstones or wall thickening visualized. No sonographic Murphy sign noted.  Common bile duct: Diameter: 3.1 mm  Liver: No focal lesion identified. Within normal limits in parenchymal echogenicity.  IVC: No abnormality visualized.  Pancreas: Visualized portion unremarkable.  Spleen: Size and appearance within normal limits.  Right Kidney: Length: 10.0 cm. Echogenicity within normal limits. No mass or hydronephrosis visualized.  Left Kidney: Length: 10.8 cm. Echogenicity within  normal limits. No mass or hydronephrosis visualized.  Abdominal aorta: No aneurysm visualized.  Other findings: None.  IMPRESSION: Negative exam.   Electronically Signed   By: Marcello Moores  Register   On: 08/31/2014 15:14   Dg Abd 2 Views  08/31/2014   CLINICAL DATA:  Abdominal pain, nausea and vomiting.  EXAM: ABDOMEN - 2 VIEW  COMPARISON:  None.  FINDINGS: The abdominal bowel gas pattern is unremarkable. No findings for obstruction or perforation. The soft tissue shadows are maintained. No worrisome calcifications. The bony structures are intact. Advanced degenerative changes involving the lumbar spine along with postoperative changes. The spinal cord stimulator is noted.  IMPRESSION: No plain film findings for an acute abdominal process.   Electronically Signed   By: Kalman Jewels M.D.   On: 08/31/2014 17:08   Dg Abd Acute W/chest  08/29/2014   CLINICAL DATA:  Nausea, vomiting for 1 week, upper and lower abdominal pain  EXAM: ACUTE ABDOMEN SERIES (ABDOMEN 2 VIEW & CHEST 1 VIEW)  COMPARISON:  None.  FINDINGS: There is no evidence of dilated bowel loops or free intraperitoneal air. No radiopaque calculi or other significant radiographic abnormality is seen. Heart size and mediastinal contours are within normal limits. Both lungs are clear.  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 There is a spinal stimulator present.  IMPRESSION: Negative abdominal radiographs.  No acute cardiopulmonary disease.   Electronically Signed   By: Kathreen Devoid   On: 08/29/2014 16:24         Subjective: Patient is feeling better today. Abdominal pain is improving patient has not had any vomiting last 24 hours. Denies fevers, chills, chest pain, shortness breath, diarrhea, hematochezia, melena, dysuria.  Objective: Filed Vitals:   08/31/14 0945 08/31/14 1351 08/31/14 2155 09/01/14  0500  BP: 140/62 141/77 165/61 150/57  Pulse: 76 78 68 73  Temp: 97.6 F (36.4 C) 98.6 F (37 C) 98.6 F (37 C) 98 F (36.7 C)  TempSrc: Oral  Oral Oral Oral  Resp: 20 20 18 18   Height:      Weight:      SpO2: 95% 99% 95% 97%    Intake/Output Summary (Last 24 hours) at 09/01/14 1340 Last data filed at 09/01/14 1000  Gross per 24 hour  Intake   1090 ml  Output      0 ml  Net   1090 ml   Weight change:  Exam:   General:  Pt is alert, follows commands appropriately, not in acute distress  HEENT: No icterus, No thrush,[place]/AT  Cardiovascular: RRR, S1/S2, no rubs, no gallops  Respiratory: CTA bilaterally, no wheezing, no crackles, no rhonchi  Abdomen: Soft/+BS, non tender, non distended, no guarding  Extremities: No edema, No lymphangitis, No petechiae, No rashes, no synovitis  Data Reviewed: Basic Metabolic Panel:  Recent Labs Lab 08/29/14 1328 08/30/14 1518 08/31/14 0436  NA 135* 140 138  K 3.0* 3.3* 2.8*  CL 92* 99 95*  CO2 24 29 30   GLUCOSE 351* 131* 142*  BUN 22 9 8   CREATININE 0.60 0.57 0.63  CALCIUM 9.8 9.3 9.0  MG  --  1.6  --    Liver Function Tests:  Recent Labs Lab 08/29/14 1328 08/31/14 0436  AST 13 11  ALT 12 8  ALKPHOS 76 60  BILITOT 1.2 1.1  PROT 7.5 6.2  ALBUMIN 4.4 3.3*    Recent Labs Lab 08/29/14 1328  LIPASE 10*   No results found for this basename: AMMONIA,  in the last 168 hours CBC:  Recent Labs Lab 08/29/14 1529  WBC 4.6  NEUTROABS 3.7  HGB 14.4  HCT 38.5  MCV 86.5  PLT 120*   Cardiac Enzymes: No results found for this basename: CKTOTAL, CKMB, CKMBINDEX, TROPONINI,  in the last 168 hours BNP: No components found with this basename: POCBNP,  CBG:  Recent Labs Lab 08/31/14 1149 08/31/14 1705 08/31/14 2058 09/01/14 0801 09/01/14 1212  GLUCAP 128* 146* 181* 161* 155*    No results found for this or any previous visit (from the past 240 hour(s)).   Scheduled Meds: . antiseptic oral rinse  7 mL Mouth Rinse q12n4p  . aspirin  81 mg Oral Daily  . atorvastatin  20 mg Oral QHS  . chlorhexidine  15 mL Mouth Rinse BID  . enoxaparin (LOVENOX) injection   40 mg Subcutaneous Q24H  . FLUoxetine  20 mg Oral Daily  . insulin aspart  0-15 Units Subcutaneous TID WC  . insulin glargine  2 Units Subcutaneous BID  . levothyroxine  50 mcg Oral QAC breakfast  . losartan  100 mg Oral Daily  . metoCLOPramide (REGLAN) injection  10 mg Intravenous 3 times per day  . omega-3 acid ethyl esters  1 g Oral Daily  . pantoprazole (PROTONIX) IV  40 mg Intravenous Q24H   Continuous Infusions: . sodium chloride 0.9 % 1,000 mL with potassium chloride 20 mEq infusion 100 mL/hr at 08/31/14 1806     Laelia Angelo, DO  [facility] Pager [phone]  If 7PM-7AM, please contact night-coverage [email or web address] Password [email or web address] 09/01/2014, 1:40 PM   LOS: 3 days

## 2014-09-02 LAB — BASIC METABOLIC PANEL
ANION GAP: 10 (ref 5–15)
BUN: 7 mg/dL (ref 6–23)
CALCIUM: 8.8 mg/dL (ref 8.4–10.5)
CO2: 27 mEq/L (ref 19–32)
Chloride: 102 mEq/L (ref 96–112)
Creatinine, Ser: 0.6 mg/dL (ref 0.50–1.10)
GFR calc Af Amer: >90 mL/min (ref >90)
GFR, EST NON AFRICAN AMERICAN: 88 mL/min — AB (ref >90)
Glucose, Bld: 117 mg/dL — ABNORMAL HIGH (ref 70–99)
Potassium: 3.8 mEq/L (ref 3.7–5.3)
SODIUM: 139 meq/L (ref 137–147)

## 2014-09-02 LAB — GLUCOSE, CAPILLARY
Glucose-Capillary: 127 mg/dL — ABNORMAL HIGH (ref 70–99)
Glucose-Capillary: 198 mg/dL — ABNORMAL HIGH (ref 70–99)

## 2014-09-02 MED ORDER — PANTOPRAZOLE SODIUM 40 MG PO TBEC: 40.0000 mg | DELAYED_RELEASE_TABLET | Freq: Every day | ORAL | Status: DC

## 2014-09-02 MED ORDER — INSULIN GLARGINE 100 UNIT/ML ~~LOC~~ SOLN
4.0000 [IU] | Freq: Two times a day (BID) | SUBCUTANEOUS | Status: DC
  Administered 2014-09-02: 4 [IU] via SUBCUTANEOUS
  Filled 2014-09-02: qty 0.04

## 2014-09-02 MED ORDER — INSULIN GLARGINE 100 UNIT/ML ~~LOC~~ SOLN: 4.0000 [IU] | Freq: Two times a day (BID) | SUBCUTANEOUS | Status: DC

## 2014-09-02 NOTE — Progress Notes (Signed)
This patient is receiving Protonix. Based on criteria approved by the Pharmacy and Therapeutics Committee, this medication is being converted to the equivalent oral dose form. These criteria include:   . The patient is eating (either orally or per tube) and/or has been taking other orally administered medications for at least 24 hours.  . This patient has no evidence of active gastrointestinal bleeding or impaired GI absorption (gastrectomy, short bowel, patient on TNA or NPO).   If you have questions about this conversion, please contact the pharmacy department.  [name], [facility] 09/02/2014 10:19 AM

## 2014-09-02 NOTE — Discharge Summary (Signed)
Physician Discharge Summary  JAKYRA OFFNER [id number] DOB: [birth year] DOA: 08/29/2014  PCP: [name], PA-C  Admit date: 08/29/2014 Discharge date: 09/02/2014  Recommendations for Outpatient Follow-up:  1. Pt will need to follow up with PCP in 2 weeks post discharge 2. Please obtain BMP and CBC in one week  Discharge Diagnoses:  Recurrent vomiting  -Likely due to gastritis with a component of diabetic gastroparesis  -pt was also taking Aleve daily x 3 weeks prior to admission  -previously improving without any vomiting since admission but small setback on 08/31/14  -Continue IV fluids  -08/31/2014 Patient endorsed abdominal pain and increased nausea  -10/15--Abdominal ultrasound--negative  -10/15--AXR--neg  -10/16--clinically improving again after back to clears x 24hrs-->advance to full liquid  -The patient was ultimately advanced to a carbohydrate modified diet which she tolerated without any further emesis -Urinalysis negative for pyuria  -LFTs and lipase normal at the time of admission  -The patient has had 2 negative gastric emptying studies on 07/22/2013 and 06/09/2014 Diabetes mellitus type 2, uncontrolled  -CBGs controlled, but expect increased CBGs as diet is advanced  -Based upon her CBGs, her home dose of Lantus was increased to 4 units twice a day  -the patient was instructed to followup with her endocrinologist -She was instructed to keep a glycemic log with which to take to her physician  -NovoLog sliding scale  -08/29/2014 hemoglobin A1c 9.0  Hypokalemia  -Secondary to GI loss  -Repleted  -Check magnesium--1.6  Thrombocytopenia  -Appears chronic  -Continue to trend  -No bleeding complications presently  Hypertension  -Stable  -Continue losartan  -hold HCTZ  Hypothyroidism  -Continue Synthroid    Discharge Condition: Stable  Disposition:  home  Diet: Carbohydrate modified Wt Readings from Last 3 Encounters:  08/29/14 60.8 kg (134 lb 0.6  oz)  05/15/14 58.333 kg (128 lb 9.6 oz)  05/08/14 57.425 kg (126 lb 9.6 oz)    History of present illness:  73 y.o. female with a history of hyperlipidemia, hypertension, diabetes mellitus presenting with recurrent nausea and vomiting with associated abdominal pain. She states that she has been sick for about a week. Patient has had inability to keep food down. She has not had fevers, sick contacts, new foods or unusual foods, and no diarrhea. Acute abdominal series in the emergency department was negative. Abdominal ultrasound was negative for cholecystitis or hydronephrosis. The patient was treated conservatively. She was made n.p.o. and started on IV fluids and antiemetics. Although the patient initially improved she began having abdominal pain and emesis on 08/31/2014. Her diet was backed down to clear liquids. Abdominal x-ray repeated was negative. The patient was started back on IV Reglan. She gradually improved and her diet was advanced slowly. She tolerated her diet. Her insulin was adjusted based upon her CBGs. He was discharged home in stable condition.     Discharge Exam: Filed Vitals:   09/02/14 0512  BP: 144/57  Pulse: 66  Temp: 98.2 F (36.8 C)  Resp: 18   Filed Vitals:   09/01/14 0500 09/01/14 1400 09/01/14 2122 09/02/14 0512  BP: 150/57 153/51 163/68 144/57  Pulse: 73 76 74 66  Temp: 98 F (36.7 C) 97.8 F (36.6 C) 98.5 F (36.9 C) 98.2 F (36.8 C)  TempSrc: Oral Oral Oral Oral  Resp: 18 18 18 18   Height:      Weight:      SpO2: 97% 98% 97% 98%   General: A&O x 3, NAD, pleasant, cooperative Cardiovascular: RRR, no rub,  no gallop, no S3 Respiratory: CTAB, no wheeze, no rhonchi Abdomen:soft, mild epigastric tenderness without guarding, nondistended, positive bowel sounds Extremities: No edema, No lymphangitis, no petechiae  Discharge Instructions     Medication List         atorvastatin 20 MG tablet  Commonly known as:  LIPITOR  Take 20 mg by mouth at  bedtime.     [employer] ASPIRIN 81 MG chewable tablet  Generic drug:  aspirin  Chew 81 mg by mouth daily.     Fish Oil 1000 MG Caps  Take 1,000 mg by mouth 2 (two) times daily. OTC fish oil     FLUoxetine 20 MG capsule  Commonly known as:  PROZAC  Take 20 mg by mouth daily.     insulin aspart 100 UNIT/ML injection  Commonly known as:  novoLOG  Inject 0-4 Units into the skin 3 (three) times daily as needed for high blood sugar. Sliding scale     insulin glargine 100 UNIT/ML injection  Commonly known as:  LANTUS  Inject 0.04 mLs (4 Units total) into the skin 2 (two) times daily.     levothyroxine 50 MCG tablet  Commonly known as:  SYNTHROID, LEVOTHROID  Take 50 mcg by mouth every morning.     losartan-hydrochlorothiazide 100-25 MG per tablet  Commonly known as:  HYZAAR  Take 1 tablet by mouth daily.     meclizine 25 MG tablet  Commonly known as:  ANTIVERT  Take 25 mg by mouth every 6 (six) hours as needed for dizziness or nausea.     ondansetron 8 MG tablet  Commonly known as:  ZOFRAN  Take 8 mg by mouth every 8 (eight) hours as needed for nausea or vomiting.         The results of significant diagnostics from this hospitalization (including imaging, microbiology, ancillary and laboratory) are listed below for reference.    Significant Diagnostic Studies: US Abdomen Complete  08/31/2014   CLINICAL DATA:  Recurrent vomiting.  Abdominal pain .  EXAM: ULTRASOUND ABDOMEN COMPLETE  COMPARISON:  08/29/2014.  FINDINGS: Gallbladder: No gallstones or wall thickening visualized. No sonographic Murphy sign noted.  Common bile duct: Diameter: 3.1 mm  Liver: No focal lesion identified. Within normal limits in parenchymal echogenicity.  IVC: No abnormality visualized.  Pancreas: Visualized portion unremarkable.  Spleen: Size and appearance within normal limits.  Right Kidney: Length: 10.0 cm. Echogenicity within normal limits. No mass or hydronephrosis visualized.  Left Kidney:  Length: 10.8 cm. Echogenicity within normal limits. No mass or hydronephrosis visualized.  Abdominal aorta: No aneurysm visualized.  Other findings: None.  IMPRESSION: Negative exam.   Electronically Signed   By: Marcello Moores  Register   On: 08/31/2014 15:14   Dg Abd 2 Views  08/31/2014   CLINICAL DATA:  Abdominal pain, nausea and vomiting.  EXAM: ABDOMEN - 2 VIEW  COMPARISON:  None.  FINDINGS: The abdominal bowel gas pattern is unremarkable. No findings for obstruction or perforation. The soft tissue shadows are maintained. No worrisome calcifications. The bony structures are intact. Advanced degenerative changes involving the lumbar spine along with postoperative changes. The spinal cord stimulator is noted.  IMPRESSION: No plain film findings for an acute abdominal process.   Electronically Signed   By: Kalman Jewels M.D.   On: 08/31/2014 17:08   Dg Abd Acute W/chest  08/29/2014   CLINICAL DATA:  Nausea, vomiting for 1 week, upper and lower abdominal pain  EXAM: ACUTE ABDOMEN SERIES (ABDOMEN 2 VIEW & CHEST  1 VIEW)  COMPARISON:  None.  FINDINGS: There is no evidence of dilated bowel loops or free intraperitoneal air. No radiopaque calculi or other significant radiographic abnormality is seen. Heart size and mediastinal contours are within normal limits. Both lungs are clear.  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 There is a spinal stimulator present.  IMPRESSION: Negative abdominal radiographs.  No acute cardiopulmonary disease.   Electronically Signed   By: Kathreen Devoid   On: 08/29/2014 16:24     Microbiology: No results found for this or any previous visit (from the past 240 hour(s)).   Labs: Basic Metabolic Panel:  Recent Labs Lab 08/29/14 1328 08/30/14 1518 08/31/14 0436 09/01/14 1316 09/02/14 0505  NA 135* 140 138 139 139  K 3.0* 3.3* 2.8* 3.7 3.8  CL 92* 99 95* 99 102  CO2 24 29 30 27 27   GLUCOSE 351* 131* 142* 143* 117*  BUN 22 9 8 8 7   CREATININE 0.60 0.57 0.63 0.56 0.60  CALCIUM  9.8 9.3 9.0 8.9 8.8  MG  --  1.6  --   --   --    Liver Function Tests:  Recent Labs Lab 08/29/14 1328 08/31/14 0436  AST 13 11  ALT 12 8  ALKPHOS 76 60  BILITOT 1.2 1.1  PROT 7.5 6.2  ALBUMIN 4.4 3.3*    Recent Labs Lab 08/29/14 1328  LIPASE 10*   No results found for this basename: AMMONIA,  in the last 168 hours CBC:  Recent Labs Lab 08/29/14 1529  WBC 4.6  NEUTROABS 3.7  HGB 14.4  HCT 38.5  MCV 86.5  PLT 120*   Cardiac Enzymes: No results found for this basename: CKTOTAL, CKMB, CKMBINDEX, TROPONINI,  in the last 168 hours BNP: No components found with this basename: POCBNP,  CBG:  Recent Labs Lab 09/01/14 0801 09/01/14 1212 09/01/14 1648 09/01/14 2118 09/02/14 0758  GLUCAP 161* 155* 194* 147* 127*    Time coordinating discharge:  Greater than 30 minutes  Signed:  Shirlena Brinegar, DO [facility] Pager: [phone] 09/02/2014, 10:47 AM

## 2014-10-24 DIAGNOSIS — E785 Hyperlipidemia, unspecified: Secondary | ICD-10-CM | POA: Insufficient documentation

## 2015-01-10 DIAGNOSIS — E119 Type 2 diabetes mellitus without complications: Secondary | ICD-10-CM | POA: Insufficient documentation

## 2015-03-24 ENCOUNTER — Emergency Department (HOSPITAL_COMMUNITY): Payer: Medicare Other

## 2015-03-24 ENCOUNTER — Observation Stay (HOSPITAL_COMMUNITY)
Admission: EM | Admit: 2015-03-24 | Discharge: 2015-03-26 | Disposition: A | Discharge status: Home / Self Care | Payer: Medicare Other | Attending: Internal Medicine | Admitting: Internal Medicine

## 2015-03-24 ENCOUNTER — Encounter (HOSPITAL_COMMUNITY): Payer: Self-pay | Admitting: Emergency Medicine

## 2015-03-24 DIAGNOSIS — E7800 Pure hypercholesterolemia, unspecified: Secondary | ICD-10-CM | POA: Diagnosis present

## 2015-03-24 DIAGNOSIS — G5790 Unspecified mononeuropathy of unspecified lower limb: Secondary | ICD-10-CM | POA: Diagnosis present

## 2015-03-24 DIAGNOSIS — E039 Hypothyroidism, unspecified: Secondary | ICD-10-CM | POA: Diagnosis not present

## 2015-03-24 DIAGNOSIS — Z885 Allergy status to narcotic agent status: Secondary | ICD-10-CM | POA: Insufficient documentation

## 2015-03-24 DIAGNOSIS — IMO0002 Reserved for concepts with insufficient information to code with codable children: Secondary | ICD-10-CM | POA: Diagnosis present

## 2015-03-24 DIAGNOSIS — Z96651 Presence of right artificial knee joint: Secondary | ICD-10-CM | POA: Insufficient documentation

## 2015-03-24 DIAGNOSIS — F32A Depression, unspecified: Secondary | ICD-10-CM | POA: Diagnosis present

## 2015-03-24 DIAGNOSIS — I10 Essential (primary) hypertension: Secondary | ICD-10-CM | POA: Diagnosis not present

## 2015-03-24 DIAGNOSIS — Z90710 Acquired absence of both cervix and uterus: Secondary | ICD-10-CM | POA: Insufficient documentation

## 2015-03-24 DIAGNOSIS — R1013 Epigastric pain: Secondary | ICD-10-CM | POA: Insufficient documentation

## 2015-03-24 DIAGNOSIS — Z794 Long term (current) use of insulin: Secondary | ICD-10-CM | POA: Insufficient documentation

## 2015-03-24 DIAGNOSIS — Z6825 Body mass index (BMI) 25.0-25.9, adult: Secondary | ICD-10-CM | POA: Insufficient documentation

## 2015-03-24 DIAGNOSIS — D696 Thrombocytopenia, unspecified: Secondary | ICD-10-CM | POA: Diagnosis not present

## 2015-03-24 DIAGNOSIS — E1140 Type 2 diabetes mellitus with diabetic neuropathy, unspecified: Secondary | ICD-10-CM | POA: Diagnosis not present

## 2015-03-24 DIAGNOSIS — Z886 Allergy status to analgesic agent status: Secondary | ICD-10-CM | POA: Insufficient documentation

## 2015-03-24 DIAGNOSIS — Z7982 Long term (current) use of aspirin: Secondary | ICD-10-CM | POA: Diagnosis not present

## 2015-03-24 DIAGNOSIS — E876 Hypokalemia: Secondary | ICD-10-CM | POA: Diagnosis present

## 2015-03-24 DIAGNOSIS — M47816 Spondylosis without myelopathy or radiculopathy, lumbar region: Secondary | ICD-10-CM | POA: Insufficient documentation

## 2015-03-24 DIAGNOSIS — R1110 Vomiting, unspecified: Secondary | ICD-10-CM | POA: Diagnosis present

## 2015-03-24 DIAGNOSIS — R112 Nausea with vomiting, unspecified: Secondary | ICD-10-CM | POA: Diagnosis not present

## 2015-03-24 DIAGNOSIS — M75102 Unspecified rotator cuff tear or rupture of left shoulder, not specified as traumatic: Secondary | ICD-10-CM | POA: Diagnosis present

## 2015-03-24 DIAGNOSIS — E43 Unspecified severe protein-calorie malnutrition: Secondary | ICD-10-CM | POA: Diagnosis present

## 2015-03-24 DIAGNOSIS — F329 Major depressive disorder, single episode, unspecified: Secondary | ICD-10-CM | POA: Insufficient documentation

## 2015-03-24 DIAGNOSIS — E785 Hyperlipidemia, unspecified: Secondary | ICD-10-CM | POA: Diagnosis not present

## 2015-03-24 DIAGNOSIS — Z888 Allergy status to other drugs, medicaments and biological substances status: Secondary | ICD-10-CM | POA: Diagnosis not present

## 2015-03-24 DIAGNOSIS — E1165 Type 2 diabetes mellitus with hyperglycemia: Secondary | ICD-10-CM | POA: Diagnosis not present

## 2015-03-24 DIAGNOSIS — R109 Unspecified abdominal pain: Secondary | ICD-10-CM

## 2015-03-24 HISTORY — DX: Hypothyroidism, unspecified: E03.9

## 2015-03-24 LAB — URINALYSIS, ROUTINE W REFLEX MICROSCOPIC
Bilirubin Urine: NEGATIVE
GLUCOSE, UA: 100 mg/dL — AB
HGB URINE DIPSTICK: NEGATIVE
KETONES UR: 15 mg/dL — AB
Leukocytes, UA: NEGATIVE
Nitrite: NEGATIVE
Protein, ur: NEGATIVE mg/dL
Specific Gravity, Urine: 1.01 (ref 1.005–1.030)
Urobilinogen, UA: 0.2 mg/dL (ref 0.0–1.0)
pH: 8 (ref 5.0–8.0)

## 2015-03-24 LAB — LIPASE, BLOOD: LIPASE: 17 U/L — AB (ref 22–51)

## 2015-03-24 LAB — COMPREHENSIVE METABOLIC PANEL
ALBUMIN: 4.4 g/dL (ref 3.5–5.0)
ALK PHOS: 89 U/L (ref 38–126)
ALT: 15 U/L (ref 14–54)
ANION GAP: 11 (ref 5–15)
AST: 18 U/L (ref 15–41)
BUN: 16 mg/dL (ref 6–20)
CO2: 25 mmol/L (ref 22–32)
Calcium: 9.5 mg/dL (ref 8.9–10.3)
Chloride: 101 mmol/L (ref 101–111)
Creatinine, Ser: 0.59 mg/dL (ref 0.44–1.00)
GFR calc non Af Amer: >60 mL/min (ref >60)
Glucose, Bld: 216 mg/dL — ABNORMAL HIGH (ref 70–99)
POTASSIUM: 3.2 mmol/L — AB (ref 3.5–5.1)
SODIUM: 137 mmol/L (ref 135–145)
TOTAL PROTEIN: 7.6 g/dL (ref 6.5–8.1)
Total Bilirubin: 1.2 mg/dL (ref 0.3–1.2)

## 2015-03-24 LAB — CBC WITH DIFFERENTIAL/PLATELET
BASOS ABS: 0 10*3/uL (ref 0.0–0.1)
BASOS PCT: 1 % (ref 0–1)
Eosinophils Absolute: 0 10*3/uL (ref 0.0–0.7)
Eosinophils Relative: 1 % (ref 0–5)
HCT: 39.9 % (ref 36.0–46.0)
Hemoglobin: 14.1 g/dL (ref 12.0–15.0)
Lymphocytes Relative: 27 % (ref 12–46)
Lymphs Abs: 1.4 10*3/uL (ref 0.7–4.0)
MCH: 30.9 pg (ref 26.0–34.0)
MCHC: 35.3 g/dL (ref 30.0–36.0)
MCV: 87.3 fL (ref 78.0–100.0)
MONO ABS: 0.4 10*3/uL (ref 0.1–1.0)
Monocytes Relative: 8 % (ref 3–12)
Neutro Abs: 3.2 10*3/uL (ref 1.7–7.7)
Neutrophils Relative %: 63 % (ref 43–77)
Platelets: 143 10*3/uL — ABNORMAL LOW (ref 150–400)
RBC: 4.57 MIL/uL (ref 3.87–5.11)
RDW: 12.4 % (ref 11.5–15.5)
WBC: 5.1 10*3/uL (ref 4.0–10.5)

## 2015-03-24 LAB — I-STAT TROPONIN, ED: TROPONIN I, POC: 0 ng/mL (ref 0.00–0.08)

## 2015-03-24 LAB — CBG MONITORING, ED
GLUCOSE-CAPILLARY: 168 mg/dL — AB (ref 70–99)
Glucose-Capillary: 176 mg/dL — ABNORMAL HIGH (ref 70–99)

## 2015-03-24 LAB — PROTIME-INR
INR: 1.06 (ref 0.00–1.49)
Prothrombin Time: 13.9 seconds (ref 11.6–15.2)

## 2015-03-24 LAB — GLUCOSE, CAPILLARY: Glucose-Capillary: 152 mg/dL — ABNORMAL HIGH (ref 70–99)

## 2015-03-24 LAB — TROPONIN I: Troponin I: <0.03 ng/mL (ref <0.031)

## 2015-03-24 MED ORDER — LEVOTHYROXINE SODIUM 100 MCG IV SOLR
25.0000 ug | Freq: Once | INTRAVENOUS | Status: AC
  Administered 2015-03-24: 25 ug via INTRAVENOUS
  Filled 2015-03-24: qty 5

## 2015-03-24 MED ORDER — POTASSIUM CHLORIDE 10 MEQ/100ML IV SOLN
10.0000 meq | INTRAVENOUS | Status: AC
  Administered 2015-03-24 – 2015-03-25 (×3): 10 meq via INTRAVENOUS
  Filled 2015-03-24 (×3): qty 100

## 2015-03-24 MED ORDER — LEVOTHYROXINE SODIUM 50 MCG PO TABS
50.0000 ug | ORAL_TABLET | Freq: Every day | ORAL | Status: DC
  Administered 2015-03-25 – 2015-03-26 (×2): 50 ug via ORAL
  Filled 2015-03-24 (×4): qty 1

## 2015-03-24 MED ORDER — FISH OIL 1000 MG PO CAPS: 1000.0000 mg | ORAL_CAPSULE | Freq: Two times a day (BID) | ORAL | Status: DC

## 2015-03-24 MED ORDER — SODIUM CHLORIDE 0.9 % IV SOLN
INTRAVENOUS | Status: DC
  Administered 2015-03-25: via INTRAVENOUS

## 2015-03-24 MED ORDER — FLUOXETINE HCL 20 MG PO CAPS
20.0000 mg | ORAL_CAPSULE | Freq: Every day | ORAL | Status: DC
  Administered 2015-03-25 – 2015-03-26 (×2): 20 mg via ORAL
  Filled 2015-03-24 (×3): qty 1

## 2015-03-24 MED ORDER — ATORVASTATIN CALCIUM 20 MG PO TABS
20.0000 mg | ORAL_TABLET | Freq: Every day | ORAL | Status: DC
  Administered 2015-03-25: 20 mg via ORAL
  Filled 2015-03-24 (×3): qty 1

## 2015-03-24 MED ORDER — HEPARIN SODIUM (PORCINE) 5000 UNIT/ML IJ SOLN
5000.0000 [IU] | Freq: Three times a day (TID) | INTRAMUSCULAR | Status: DC
  Administered 2015-03-24 – 2015-03-26 (×6): 5000 [IU] via SUBCUTANEOUS
  Filled 2015-03-24 (×8): qty 1

## 2015-03-24 MED ORDER — INSULIN ASPART 100 UNIT/ML ~~LOC~~ SOLN
0.0000 [IU] | Freq: Three times a day (TID) | SUBCUTANEOUS | Status: DC
  Administered 2015-03-26: 1 [IU] via SUBCUTANEOUS

## 2015-03-24 MED ORDER — SODIUM CHLORIDE 0.9 % IJ SOLN: 3.0000 mL | Freq: Two times a day (BID) | INTRAMUSCULAR | Status: DC

## 2015-03-24 MED ORDER — INSULIN GLARGINE 100 UNIT/ML ~~LOC~~ SOLN
4.0000 [IU] | Freq: Every day | SUBCUTANEOUS | Status: DC
  Filled 2015-03-24 (×3): qty 0.04

## 2015-03-24 MED ORDER — PANTOPRAZOLE SODIUM 40 MG IV SOLR
40.0000 mg | INTRAVENOUS | Status: DC
  Administered 2015-03-24 – 2015-03-25 (×2): 40 mg via INTRAVENOUS
  Filled 2015-03-24 (×3): qty 40

## 2015-03-24 MED ORDER — ONDANSETRON HCL 4 MG/2ML IJ SOLN: 4.0000 mg | Freq: Once | INTRAMUSCULAR | Status: DC

## 2015-03-24 MED ORDER — HYDRALAZINE HCL 20 MG/ML IJ SOLN
10.0000 mg | Freq: Once | INTRAMUSCULAR | Status: AC
  Administered 2015-03-24: 10 mg via INTRAVENOUS
  Filled 2015-03-24: qty 1

## 2015-03-24 MED ORDER — LOSARTAN POTASSIUM 50 MG PO TABS
100.0000 mg | ORAL_TABLET | Freq: Every day | ORAL | Status: DC
Start: 2015-03-24 — End: 2015-03-25
  Filled 2015-03-24 (×2): qty 2

## 2015-03-24 MED ORDER — ONDANSETRON 8 MG PO TBDP
8.0000 mg | ORAL_TABLET | Freq: Once | ORAL | Status: DC
  Filled 2015-03-24 (×2): qty 1

## 2015-03-24 MED ORDER — HYDRALAZINE HCL 20 MG/ML IJ SOLN: 5.0000 mg | INTRAMUSCULAR | Status: DC | PRN

## 2015-03-24 MED ORDER — SODIUM CHLORIDE 0.9 % IV BOLUS (SEPSIS)
500.0000 mL | Freq: Once | INTRAVENOUS | Status: AC
  Administered 2015-03-24: 500 mL via INTRAVENOUS

## 2015-03-24 MED ORDER — PROMETHAZINE HCL 25 MG/ML IJ SOLN
12.5000 mg | Freq: Once | INTRAMUSCULAR | Status: AC
  Administered 2015-03-24: 12.5 mg via INTRAMUSCULAR
  Filled 2015-03-24: qty 1

## 2015-03-24 MED ORDER — OMEGA-3-ACID ETHYL ESTERS 1 G PO CAPS
1.0000 g | ORAL_CAPSULE | Freq: Every day | ORAL | Status: DC
  Administered 2015-03-25 – 2015-03-26 (×2): 1 g via ORAL
  Filled 2015-03-24 (×2): qty 1

## 2015-03-24 MED ORDER — ASPIRIN 81 MG PO CHEW
81.0000 mg | CHEWABLE_TABLET | Freq: Every day | ORAL | Status: DC
  Administered 2015-03-25 – 2015-03-26 (×2): 81 mg via ORAL
  Filled 2015-03-24 (×2): qty 1

## 2015-03-24 MED ORDER — HYDROXYZINE HCL 50 MG/ML IM SOLN
25.0000 mg | Freq: Four times a day (QID) | INTRAMUSCULAR | Status: DC | PRN
  Filled 2015-03-24: qty 0.5

## 2015-03-24 MED ORDER — POLYETHYLENE GLYCOL 3350 17 G PO PACK: 17.0000 g | PACK | Freq: Every day | ORAL | Status: DC | PRN

## 2015-03-24 NOTE — H&P (Addendum)
[facility] History and Physical  MARIABELEN CHUGH [id number] DOB: [birth year] DOA: 03/24/2015  Referring physician: ED physician PCP: [name], PA-C  Specialists:   Chief Complaint: Intractable nausea and vomiting  HPI: Sara Graves is a 74 y.o. female with PMH of hypertension, hyperlipidemia, hypothyroidism, depression, arthritis, left shoulder rotator cuff, who presents with intractable nausea and vomiting.  Patient reports that she has chronic nausea and vomiting. She was hospitalized in last October, and was suspected to have possible gastritis versus gastroparesis due to poorly controlled diabetes. Her nausea and vomiting are usually mild and tolerable, but in the past 4 days, her symptoms have been progressively getting worse. She vomited many times, and cannot keep food down. She has mild epigastric abdominal pain, no diarrhea. She reports that she was seen by GI, Dr. Arna Medici and had 2 negative gastric emptying studies on 07/22/2013 and 06/09/2014 per patient. She reports that she did not have a bowel movement in the past 3 days. She takes Aleve regularly for left shoulder pain.  Currently patient denies fever, chills, running nose, ear pain, headaches, cough, chest pain, SOB, diarrhea, dysuria, urgency, frequency, hematuria, skin rashes and leg swelling. No unilateral weakness, numbness or tingling sensations. No vision change or hearing loss.  In ED, patient was found to have negative urinalysis, negative troponin, WBC 5.1, potassium 3.2, renal function okay, lipase 17, temperature normal, no tachycardia.  X-ray of chest/Abd negative. The patient is admitted to inpatient for further evaluation and treatment.  Where does patient live?      At home    Can patient participate in ADLs? some  Review of Systems:   General: no fevers, chills, no changes in body weight, poor appetite, has fatigure HEENT: no blurry vision, hearing changes or sore throat Pulm: no dyspnea,  coughing, wheezing CV: no chest pain, palpitations, shortness of breath Abd: has nausea, vomiting and abdominal pain. No diarrhea GU: no dysuria, hematuria, polyuria Ext: no leg edema Neuro: no unilateral weakness, numbness, or tingling Skin: no rash MSK: No muscle spasm, no deformity, no limitation of range of movement in spin Heme: No easy bruising.  Travel history: No recent long distant travel.  Allergy:  Allergies  Allergen Reactions  . Rosiglitazone Maleate Anaphylaxis and Swelling      (ANTIDIABETIC MED.) All over body   . Elavil [Amitriptyline] Nausea Only  . Morphine Other (See Comments)    SEVERE HYPOTENSION   . Tramadol-Acetaminophen Rash    Past Medical History  Diagnosis Date  . H. pylori infection 2008 and 1998    treated  . Multinodular goiter   . Hx of colonoscopy with polypectomy   . Depression   . Hyperlipidemia   . Hypertension   . Arthritis   . Urge urinary incontinence   . Diabetes mellitus, type II, insulin dependent   . Rotator cuff tear, left recurrent   . Bursitis of left shoulder   . Hypothyroidism     Past Surgical History  Procedure Laterality Date  . 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 Lipoma excision      RIGHT ELBOW  . Excision ganglion cyst left ring finger  01-17-2009  . Implantation permanent spinal cord stimulator  06-15-2008    JUNE 2013--  BATTERY CHANGE  . Foraminal decompression at l2 to the sacrum  01-05-2008    L2  -  S1  . Rotator cuff repair  03-28-2003    RIGHT SHOULDER  DEGENERATIVE AC JOINT  AND RC TEAR  . Bladder neck suspension  1970'S  . Bilateral bunionectomies    . Hammertoe repair      BILATERAL  . Vaginal hysterectomy  1970'S  . Appendectomy  AGE 82  . Tonsillectomy  AGE 60  . Shoulder open rotator cuff repair  09/07/2012    Procedure: ROTATOR CUFF REPAIR SHOULDER OPEN;  Surgeon: Magnus Sinning, MD;  Location: [place];  Service: Orthopedics;  Laterality: Left;   OPEN ANTERIOR ACROMIONECTOMY AND ROTATOR CUFF REPAIR ON LEFT   . Shoulder open rotator cuff repair Left 12/28/2012    Procedure: ROTATOR CUFF REPAIR SHOULDER OPEN;  Surgeon: Magnus Sinning, MD;  Location: [place];  Service: Orthopedics;  Laterality: Left;  OPEN SHOULDER ROTATOR CUFF REPAIR ON LEFT  WITH ANTERIOR ACROMINECTOMY     Social History:  reports that she has never smoked. She has never used smokeless tobacco. She reports that she does not drink alcohol or use illicit drugs.  Family History:  Family History  Problem Relation Age of Onset  . Cancer Mother     breast  . Heart Problems Mother   . Muscular dystrophy Son   . Cancer Maternal Grandmother     breast  . Heart disease Maternal Grandfather   . Prostate cancer Father   . Breast cancer Sister      Prior to Admission medications   Medication Sig Start Date End Date Taking? Authorizing Provider  aspirin (BAYER CHILDRENS ASPIRIN) 81 MG chewable tablet Chew 81 mg by mouth daily.    Yes Historical Provider, MD  atorvastatin (LIPITOR) 20 MG tablet Take 20 mg by mouth at bedtime.   Yes Historical Provider, MD  FLUoxetine (PROZAC) 20 MG capsule Take 20 mg by mouth daily.   Yes Historical Provider, MD  insulin glargine (LANTUS) 100 UNIT/ML injection Inject 0.04 mLs (4 Units total) into the skin 2 (two) times daily. Patient not taking: Reported on 03/24/2015 09/02/14   Orson Eva, MD  levothyroxine (SYNTHROID, LEVOTHROID) 50 MCG tablet Take 50 mcg by mouth every morning.  08/24/12  Yes Clovis Cao, MD  losartan-hydrochlorothiazide (HYZAAR) 100-25 MG per tablet Take 1 tablet by mouth daily.   Yes Historical Provider, MD  Omega-3 Fatty Acids (FISH OIL) 1000 MG CAPS Take 1,000 mg by mouth 2 (two) times daily. OTC fish oil   Yes Historical Provider, MD  repaglinide (PRANDIN) 2 MG tablet Take 2 mg by mouth 3 (three) times daily before meals. 01/10/15 01/10/16 Yes Historical Provider, MD    Physical Exam: Filed  Vitals:   03/24/15 1813 03/24/15 1851 03/24/15 1851 03/24/15 1900  BP: 193/70 155/109 155/109 136/86  Pulse: 62   70  Temp: 98.4 F (36.9 C)     TempSrc: Oral     Resp: 18   18  SpO2: 98%   100%   General: Not in acute distress HEENT:       Eyes: PERRL, EOMI, no scleral icterus       ENT: No discharge from the ears and nose, no pharynx injection, no tonsillar enlargement.        Neck: No JVD, no bruit, no mass felt. Heme: No neck or axillary lymph node enlargement Cardiac: S1/S2, RRR, No murmurs, No gallops or rubs Pulm: Good air movement bilaterally. Clear to auscultation bilaterally. No rales, wheezing, rhonchi or rubs. Abd: Soft, nondistended, mild tender over epigastric area, no rebound pain, no organomegaly, BS present Ext: No edema bilaterally. 2+DP/PT pulse bilaterally  Musculoskeletal: No joint deformities, erythema, or stiffness, ROM full Skin: No rashes.  Neuro: Alert, oriented X3, cranial nerves II-XII grossly intact, muscle strength 5/5 in all extremities, sensation to light touch intact. Brachial reflex 1+ bilaterally. Knee reflex 1+ bilaterally. Negative Babinski's sign. Normal finger to nose test. Psych: Patient is not psychotic, no suicidal or hemocidal ideation.  Labs on Admission:  Basic Metabolic Panel:  Recent Labs Lab 03/24/15 1528  NA 137  K 3.2*  CL 101  CO2 25  GLUCOSE 216*  BUN 16  CREATININE 0.59  CALCIUM 9.5   Liver Function Tests:  Recent Labs Lab 03/24/15 1528  AST 18  ALT 15  ALKPHOS 89  BILITOT 1.2  PROT 7.6  ALBUMIN 4.4    Recent Labs Lab 03/24/15 1528  LIPASE 17*   No results for input(s): AMMONIA in the last 168 hours. CBC:  Recent Labs Lab 03/24/15 1528  WBC 5.1  NEUTROABS 3.2  HGB 14.1  HCT 39.9  MCV 87.3  PLT 143*   Cardiac Enzymes: No results for input(s): CKTOTAL, CKMB, CKMBINDEX, TROPONINI in the last 168 hours.  BNP (last 3 results) No results for input(s): BNP in the last 8760 hours.  ProBNP (last 3  results) No results for input(s): PROBNP in the last 8760 hours.  CBG:  Recent Labs Lab 03/24/15 1519 03/24/15 1858  GLUCAP 168* 176*    Radiological Exams on Admission: Dg Abd Acute W/chest  03/24/2015   CLINICAL DATA:  Nausea and vomiting for 1 week.  EXAM: DG ABDOMEN ACUTE W/ 1V CHEST  COMPARISON:  Abdominal radiographs 08/31/2014. Three-way abdominal series [id number].  FINDINGS: The cardiomediastinal silhouette is within normal limits. The lungs are well inflated and clear. There is no evidence of pleural effusion or pneumothorax. Right humeral head suture anchors are noted.  There is no evidence of intraperitoneal free air. No sizable air-fluid levels are seen in the abdomen. Gas and stool are scattered throughout nondilated colon. There is a relative paucity of small bowel gas without dilated loops of bowel seen to suggest obstruction. Lumbar spondylosis is noted.  IMPRESSION: Negative abdominal radiographs.  No acute cardiopulmonary disease.   Electronically Signed   By: Logan Bores   On: 03/24/2015 17:14    EKG: Independently reviewed.  Abnormal findings:  QTC 5 01, T-wave flattening, slight ST segment depression in V4 to 6.   Assessment/Plan Principal Problem:   Intractable nausea and vomiting Active Problems:   Hypothyroidism   DM (diabetes mellitus), type 2, uncontrolled   HYPERCHOLESTEROLEMIA   Depression   Diabetic neuropathy   Left rotator cuff tear   Neuropathy of leg   Protein-calorie malnutrition, severe   Hypokalemia   Thrombocytopenia   Vomiting   Essential hypertension  Recurrent intractable nausea and vomiting: etiology is not clear. Potential differential diagnoses include diabetic gastroparesis and gastritis giving patient is taking Aleve regularly. ACS needs to be r/o'ed given ST depression. Lipase negative, no signs of posterior regulation stroke. She reports that she had 2 negative gastric empty tests in the past, but I still think that gastroparesis is  likely the etiology given poorly controled DM.  -will admit to tele bed give significantly elevated blood pressure and abnormal EKG -Patient should be benefited by Erythromycin, Reglan, Zofran or Phenergan, however patient has QTC prolongation, and is not good candidate for these medications. -IV hydroxyzine when necessary for nausea and vomiting -IV protonix 40 mg daily -IVF: NS 125/h -CT-abd/pelvise -trop x 3  Diabetes mellitus type 2, uncontrolled:  A1c was 9.0 on 08/29/14,Poorly controlled. She is taking Repaglinide at home -start lantus at 4 units daily -SSI  Hypokalemia: Potassium 3.2, secondary to GI loss.  -Repleted    Thrombocytopenia: Appears chronic issue.  No bleeding complications presently  -Continue to trend by CBC  Hypertension: Blood pressure initially elevated at 202/82, which improved to 136/86 after she received 1 dose of IV hydralazine in the emergency room. -Continue losartan   -hold HCTZ   -IV hydralazine when necessary  Hypothyroidism: TSH was 4.750 on 05/16/14. -Continue home Synthroid -Give one dose of Synthroid, 25 Mcg IV 1 -Check TSH  HLD: LDL was 123 on 04/06/14. Goal should be less than 100, ideally less than 70 -Continue Lipitor and fish oil -check FLP  Depression: Stable. No suicidal or homicidal ideations. -Continue Prozac   DVT ppx: SQ Heparin     Code Status: Full code Family Communication: None at bed side.  Disposition Plan: Admit to inpatient   Date of Service 03/24/2015    Ivor Costa [facility] Pager [phone]  If 7PM-7AM, please contact night-coverage [email or web address] Password [facility] 03/24/2015, 7:51 PM

## 2015-03-24 NOTE — ED Notes (Signed)
Patient transported to X-ray 

## 2015-03-24 NOTE — ED Notes (Addendum)
Pt c/o emesis onset last Tuesday, saw Dr. Amedeo Plenty on Wednesday and received prescription antiemetic suppositories which alleviated symptoms until Tuesday, when emesis resumed. Pt's blood sugar measured 202 today prior to arrival. Pt also states she has been unable to keep blood pressure medication down.

## 2015-03-24 NOTE — ED Notes (Signed)
Pt was given ice water for fluid challenge--- has tolerated well so far.

## 2015-03-24 NOTE — ED Notes (Signed)
Freda Munro, pt's daughter-in-law, was notified of pt's admission to Rm 1515.

## 2015-03-24 NOTE — ED Notes (Signed)
Pt's contact:  Bridgete Arrazola (daughter-in-law)--- tel# [phone]                        Ezora Thigpen (husband)------- tel# [phone]

## 2015-03-24 NOTE — ED Provider Notes (Signed)
CSN: [id number]     Arrival date & time 03/24/15  1509 History   First MD Initiated Contact with Patient 03/24/15 1548     Chief Complaint  Patient presents with  . Emesis     Patient is a 74 y.o. female presenting with vomiting. The history is provided by the patient. No language interpreter was used.  Emesis  Sara Graves presents for evaluation of vomiting.  She has had nausea and vomiting since last Wednesday.  She saw her gastroenterologist for these sxs at that time and was started on a suppository for nausea and transiently improved.  3-4 days ago she developed worsening N/V and has been unable to tolerate oral fluids or meds for the last 2-3 days.  She has mild abdominal soreness.  No fevers, chest pain, SOB.  Her last BM was a week ago but she does not feel as if she needs to have a BM.  Sxs are moderate, constant, worsening.    Past Medical History  Diagnosis Date  . H. pylori infection 2008 and 1998    treated  . Multinodular goiter   . Hx of colonoscopy with polypectomy   . Depression   . Hyperlipidemia   . Hypertension   . Arthritis   . Urge urinary incontinence   . Diabetes mellitus, type II, insulin dependent   . Rotator cuff tear, left recurrent   . Bursitis of left shoulder    Past Surgical History  Procedure Laterality Date  . 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 Lipoma excision      RIGHT ELBOW  . Excision ganglion cyst left ring finger  01-17-2009  . Implantation permanent spinal cord stimulator  06-15-2008    JUNE 2013--  BATTERY CHANGE  . Foraminal decompression at l2 to the sacrum  01-05-2008    L2  -  S1  . Rotator cuff repair  03-28-2003    RIGHT SHOULDER  DEGENERATIVE AC JOINT AND RC TEAR  . Bladder neck suspension  1970'S  . Bilateral bunionectomies    . Hammertoe repair      BILATERAL  . Vaginal hysterectomy  1970'S  . Appendectomy  AGE 48  . Tonsillectomy  AGE 89  . Shoulder open rotator cuff repair  09/07/2012   Procedure: ROTATOR CUFF REPAIR SHOULDER OPEN;  Surgeon: Magnus Sinning, MD;  Location: [place];  Service: Orthopedics;  Laterality: Left;  OPEN ANTERIOR ACROMIONECTOMY AND ROTATOR CUFF REPAIR ON LEFT   . Shoulder open rotator cuff repair Left 12/28/2012    Procedure: ROTATOR CUFF REPAIR SHOULDER OPEN;  Surgeon: Magnus Sinning, MD;  Location: [place];  Service: Orthopedics;  Laterality: Left;  OPEN SHOULDER ROTATOR CUFF REPAIR ON LEFT  WITH ANTERIOR ACROMINECTOMY    Family History  Problem Relation Age of Onset  . Cancer Mother     breast  . Heart Problems Mother   . Muscular dystrophy Son   . Cancer Maternal Grandmother     breast  . Heart disease Maternal Grandfather   . Prostate cancer Father   . Breast cancer Sister    History  Substance Use Topics  . Smoking status: Never Smoker   . Smokeless tobacco: Never Used  . Alcohol Use: No   OB History    No data available     Review of Systems  Gastrointestinal: Positive for vomiting.  All other systems reviewed and are negative.     Allergies  Rosiglitazone maleate; Elavil; Morphine; and Tramadol-acetaminophen  Home Medications   Prior to Admission medications   Medication Sig Start Date End Date Taking? Authorizing Provider  aspirin (BAYER CHILDRENS ASPIRIN) 81 MG chewable tablet Chew 81 mg by mouth daily.    Yes Historical Provider, MD  atorvastatin (LIPITOR) 20 MG tablet Take 20 mg by mouth at bedtime.    Historical Provider, MD  FLUoxetine (PROZAC) 20 MG capsule Take 20 mg by mouth daily.    Historical Provider, MD  insulin aspart (NOVOLOG) 100 UNIT/ML injection Inject 0-4 Units into the skin 3 (three) times daily as needed for high blood sugar. Sliding scale    Historical Provider, MD  insulin glargine (LANTUS) 100 UNIT/ML injection Inject 0.04 mLs (4 Units total) into the skin 2 (two) times daily. Patient not taking: Reported on 03/24/2015 09/02/14   Orson Eva, MD  Insulin  Glargine 300 UNIT/ML SOPN Inject 40 Units into the skin daily. 01/10/15 01/11/16 Yes Historical Provider, MD  levothyroxine (SYNTHROID, LEVOTHROID) 50 MCG tablet Take 50 mcg by mouth every morning.  08/24/12   Clovis Cao, MD  losartan-hydrochlorothiazide (HYZAAR) 100-25 MG per tablet Take 1 tablet by mouth daily.    Historical Provider, MD  meclizine (ANTIVERT) 25 MG tablet Take 25 mg by mouth every 6 (six) hours as needed for dizziness or nausea.    Historical Provider, MD  Omega-3 Fatty Acids (FISH OIL) 1000 MG CAPS Take 1,000 mg by mouth 2 (two) times daily. OTC fish oil    Historical Provider, MD  ondansetron (ZOFRAN) 8 MG tablet Take 8 mg by mouth every 8 (eight) hours as needed for nausea or vomiting.    Historical Provider, MD   BP 202/82 mmHg  Pulse 67  Temp(Src) 97.9 F (36.6 C) (Axillary)  Resp 25  SpO2 100% Physical Exam  Constitutional: She is oriented to person, place, and time. She appears well-developed and well-nourished.  HENT:  Head: Normocephalic and atraumatic.  Dry mucous membranes  Cardiovascular: Normal rate and regular rhythm.   No murmur heard. Pulmonary/Chest: Effort normal and breath sounds normal. No respiratory distress.  Abdominal: Soft. There is no rebound and no guarding.  Mild diffuse abdominal tenderness  Musculoskeletal: She exhibits no edema or tenderness.  Neurological: She is alert and oriented to person, place, and time.  Skin: Skin is warm and dry.  Psychiatric: She has a normal mood and affect. Her behavior is normal.  Nursing note and vitals reviewed.   ED Course  Procedures (including critical care time) Labs Review Labs Reviewed  CBC WITH DIFFERENTIAL/PLATELET - Abnormal; Notable for the following:    Platelets 143 (*)    All other components within normal limits  COMPREHENSIVE METABOLIC PANEL - Abnormal; Notable for the following:    Potassium 3.2 (*)    Glucose, Bld 216 (*)    All other components within normal limits  LIPASE,  BLOOD - Abnormal; Notable for the following:    Lipase 17 (*)    All other components within normal limits  URINALYSIS, ROUTINE W REFLEX MICROSCOPIC - Abnormal; Notable for the following:    Glucose, UA 100 (*)    Ketones, ur 15 (*)    All other components within normal limits  GLUCOSE, CAPILLARY - Abnormal; Notable for the following:    Glucose-Capillary 152 (*)    All other components within normal limits  CBG MONITORING, ED - Abnormal; Notable for the following:    Glucose-Capillary 168 (*)    All other components within  normal limits  CBG MONITORING, ED - Abnormal; Notable for the following:    Glucose-Capillary 176 (*)    All other components within normal limits  TSH  LIPID PANEL  TROPONIN I  TROPONIN I  TROPONIN I  PROTIME-INR  COMPREHENSIVE METABOLIC PANEL  CBC  I-STAT TROPOININ, ED    Imaging Review Dg Abd Acute W/chest  03/24/2015   CLINICAL DATA:  Nausea and vomiting for 1 week.  EXAM: DG ABDOMEN ACUTE W/ 1V CHEST  COMPARISON:  Abdominal radiographs 08/31/2014. Three-way abdominal series [id number].  FINDINGS: The cardiomediastinal silhouette is within normal limits. The lungs are well inflated and clear. There is no evidence of pleural effusion or pneumothorax. Right humeral head suture anchors are noted.  There is no evidence of intraperitoneal free air. No sizable air-fluid levels are seen in the abdomen. Gas and stool are scattered throughout nondilated colon. There is a relative paucity of small bowel gas without dilated loops of bowel seen to suggest obstruction. Lumbar spondylosis is noted.  IMPRESSION: Negative abdominal radiographs.  No acute cardiopulmonary disease.   Electronically Signed   By: Logan Bores   On: 03/24/2015 17:14     EKG Interpretation   Date/Time:  Saturday Mar 24 2015 16:40:32 EDT Ventricular Rate:  59 PR Interval:  139 QRS Duration: 96 QT Interval:  506 QTC Calculation: 501 R Axis:   11 Text Interpretation:  Sinus bradycardia Probable  anteroseptal infarct, old  Nonspecific T abnormalities, lateral leads Prolonged QT interval Confirmed  by Hazle Coca [phone]) on 03/24/2015 4:56:01 PM      MDM   Final diagnoses:  Essential hypertension  Intractable vomiting with nausea, vomiting of unspecified type    Patient with history of hypertension here for evaluation of vomiting. Presentation is not consistent with bowel obstruction. Patient unable to tolerate oral fluids in the emergency department. Blood pressure initially elevated, responded well to hydralazine 1. Discussed with hospitalist regarding admission for intractable nausea and vomiting.    Quintella Reichert, MD 03/24/15 2249

## 2015-03-25 ENCOUNTER — Encounter (HOSPITAL_COMMUNITY): Payer: Self-pay

## 2015-03-25 ENCOUNTER — Inpatient Hospital Stay (HOSPITAL_COMMUNITY): Payer: Medicare Other

## 2015-03-25 DIAGNOSIS — R112 Nausea with vomiting, unspecified: Secondary | ICD-10-CM

## 2015-03-25 DIAGNOSIS — E876 Hypokalemia: Secondary | ICD-10-CM | POA: Diagnosis not present

## 2015-03-25 DIAGNOSIS — E1165 Type 2 diabetes mellitus with hyperglycemia: Secondary | ICD-10-CM | POA: Diagnosis not present

## 2015-03-25 DIAGNOSIS — F329 Major depressive disorder, single episode, unspecified: Secondary | ICD-10-CM

## 2015-03-25 DIAGNOSIS — R109 Unspecified abdominal pain: Secondary | ICD-10-CM | POA: Diagnosis not present

## 2015-03-25 DIAGNOSIS — E1149 Type 2 diabetes mellitus with other diabetic neurological complication: Secondary | ICD-10-CM | POA: Diagnosis not present

## 2015-03-25 DIAGNOSIS — R1110 Vomiting, unspecified: Secondary | ICD-10-CM | POA: Diagnosis not present

## 2015-03-25 DIAGNOSIS — R1013 Epigastric pain: Secondary | ICD-10-CM | POA: Diagnosis not present

## 2015-03-25 DIAGNOSIS — I10 Essential (primary) hypertension: Secondary | ICD-10-CM

## 2015-03-25 DIAGNOSIS — D696 Thrombocytopenia, unspecified: Secondary | ICD-10-CM

## 2015-03-25 DIAGNOSIS — E039 Hypothyroidism, unspecified: Secondary | ICD-10-CM | POA: Diagnosis not present

## 2015-03-25 LAB — COMPREHENSIVE METABOLIC PANEL
ALK PHOS: 69 U/L (ref 38–126)
ALT: 10 U/L — AB (ref 14–54)
ANION GAP: 6 (ref 5–15)
AST: 13 U/L — ABNORMAL LOW (ref 15–41)
Albumin: 3.4 g/dL — ABNORMAL LOW (ref 3.5–5.0)
BUN: 12 mg/dL (ref 6–20)
CO2: 28 mmol/L (ref 22–32)
Calcium: 8.5 mg/dL — ABNORMAL LOW (ref 8.9–10.3)
Chloride: 106 mmol/L (ref 101–111)
Creatinine, Ser: 0.55 mg/dL (ref 0.44–1.00)
GFR calc Af Amer: >60 mL/min (ref >60)
GFR calc non Af Amer: >60 mL/min (ref >60)
GLUCOSE: 104 mg/dL — AB (ref 70–99)
Potassium: 3.1 mmol/L — ABNORMAL LOW (ref 3.5–5.1)
Sodium: 140 mmol/L (ref 135–145)
Total Bilirubin: 1 mg/dL (ref 0.3–1.2)
Total Protein: 6 g/dL — ABNORMAL LOW (ref 6.5–8.1)

## 2015-03-25 LAB — LIPID PANEL
CHOLESTEROL: 178 mg/dL (ref 0–200)
HDL: 46 mg/dL (ref >40)
LDL Cholesterol: 109 mg/dL — ABNORMAL HIGH (ref 0–99)
Total CHOL/HDL Ratio: 3.9 RATIO
Triglycerides: 116 mg/dL (ref <150)
VLDL: 23 mg/dL (ref 0–40)

## 2015-03-25 LAB — GLUCOSE, CAPILLARY
GLUCOSE-CAPILLARY: 105 mg/dL — AB (ref 70–99)
GLUCOSE-CAPILLARY: 116 mg/dL — AB (ref 70–99)
GLUCOSE-CAPILLARY: 95 mg/dL (ref 70–99)
Glucose-Capillary: 107 mg/dL — ABNORMAL HIGH (ref 70–99)

## 2015-03-25 LAB — CBC
HCT: 34.3 % — ABNORMAL LOW (ref 36.0–46.0)
HEMOGLOBIN: 12 g/dL (ref 12.0–15.0)
MCH: 31 pg (ref 26.0–34.0)
MCHC: 35 g/dL (ref 30.0–36.0)
MCV: 88.6 fL (ref 78.0–100.0)
PLATELETS: 137 10*3/uL — AB (ref 150–400)
RBC: 3.87 MIL/uL (ref 3.87–5.11)
RDW: 12.5 % (ref 11.5–15.5)
WBC: 5.9 10*3/uL (ref 4.0–10.5)

## 2015-03-25 LAB — TROPONIN I: Troponin I: <0.03 ng/mL (ref <0.031)

## 2015-03-25 LAB — TSH: TSH: 1.588 u[IU]/mL (ref 0.350–4.500)

## 2015-03-25 MED ORDER — METOCLOPRAMIDE HCL 5 MG/ML IJ SOLN
5.0000 mg | Freq: Three times a day (TID) | INTRAMUSCULAR | Status: DC
  Administered 2015-03-25 – 2015-03-26 (×5): 5 mg via INTRAVENOUS
  Filled 2015-03-25 (×2): qty 1
  Filled 2015-03-25 (×4): qty 2
  Filled 2015-03-25: qty 1

## 2015-03-25 MED ORDER — POTASSIUM CHLORIDE IN NACL 40-0.9 MEQ/L-% IV SOLN
INTRAVENOUS | Status: DC
  Administered 2015-03-25 – 2015-03-26 (×2): 75 mL/h via INTRAVENOUS
  Filled 2015-03-25 (×2): qty 1000

## 2015-03-25 MED ORDER — POTASSIUM CHLORIDE CRYS ER 20 MEQ PO TBCR
40.0000 meq | EXTENDED_RELEASE_TABLET | Freq: Once | ORAL | Status: AC
  Administered 2015-03-25: 40 meq via ORAL
  Filled 2015-03-25: qty 2

## 2015-03-25 MED ORDER — ONDANSETRON HCL 4 MG/2ML IJ SOLN: 4.0000 mg | Freq: Four times a day (QID) | INTRAMUSCULAR | Status: DC | PRN

## 2015-03-25 MED ORDER — SODIUM CHLORIDE 0.9 % IV SOLN
INTRAVENOUS | Status: DC
Start: 2015-03-25 — End: 2015-03-25

## 2015-03-25 MED ORDER — HYDRALAZINE HCL 20 MG/ML IJ SOLN
10.0000 mg | Freq: Four times a day (QID) | INTRAMUSCULAR | Status: DC | PRN
  Administered 2015-03-25: 10 mg via INTRAVENOUS
  Filled 2015-03-25: qty 1

## 2015-03-25 MED ORDER — AMLODIPINE BESYLATE 10 MG PO TABS
10.0000 mg | ORAL_TABLET | Freq: Every day | ORAL | Status: DC
  Administered 2015-03-25 – 2015-03-26 (×2): 10 mg via ORAL
  Filled 2015-03-25 (×2): qty 1

## 2015-03-25 MED ORDER — IOHEXOL 300 MG/ML  SOLN
50.0000 mL | Freq: Once | INTRAMUSCULAR | Status: AC | PRN
  Administered 2015-03-25: 50 mL via ORAL

## 2015-03-25 MED ORDER — MAGNESIUM SULFATE IN D5W 10-5 MG/ML-% IV SOLN
1.0000 g | Freq: Once | INTRAVENOUS | Status: AC
  Administered 2015-03-25: 1 g via INTRAVENOUS
  Filled 2015-03-25: qty 100

## 2015-03-25 MED ORDER — IOHEXOL 300 MG/ML  SOLN
100.0000 mL | Freq: Once | INTRAMUSCULAR | Status: AC | PRN
  Administered 2015-03-25: 100 mL via INTRAVENOUS

## 2015-03-25 NOTE — Progress Notes (Signed)
Patient Demographics  Sara Graves, is a 74 y.o. female, DOB - 1941/08/06, [id number]  Admit date - 03/24/2015   Admitting Physician Ivor Costa, MD  Outpatient Primary MD for the patient is [name], PA-C  LOS - 1   Chief Complaint  Patient presents with  . Emesis        Subjective:   Sara Graves today has, No headache, No chest pain, No abdominal pain - Mild Nausea, No new weakness tingling or numbness, No Cough - SOB.    Assessment & Plan    1. Recurrent nausea vomiting. Unclear etiology has been following with GI physician Dr. Amedeo Plenty on a close basis, 2 negative previous gastric emptying studies. CT scan abdomen pelvis here unremarkable. Does have uncontrolled diabetes. In the past claims that Reglan had helped, question mild intermittent gastroparesis versus gastritis due to NSAID use, place on clear liquids along with Reglan IV scheduled as now QTc is stable. Requested to abstain from NSAID use, Continue PPI. Monitor closely. If worse we will call [facility].   2. DM2 - poor outpatient control, repeat A1c pending. Last A1c was 9. Continue present regimen and monitor CBGs.  Lab Results  Component Value Date   HGBA1C 9.0* 08/29/2014    CBG (last 3)   Recent Labs  03/24/15 1858 03/24/15 2154 03/25/15 0829  GLUCAP 176* 152* 107*    3. Hypothyroidism. Continue Synthroid.    4. Chronic mild thrombocytopenia. Stable no acute issues.    5. Dyslipidemia. On statin continue. Outpatient follow-up with PCP.    6. Mildly prolonged QTC due to hypokalemia. Stable after potassium was replaced.    7. History of depression. On Prozac continue. Not suicidal homicidal.     Code Status: Full  Family Communication: None  Disposition Plan: TBD   Procedures CT abdomen  pelvis unremarkable   Consults     Medications  Scheduled Meds: . aspirin  81 mg Oral Daily  . atorvastatin  20 mg Oral QHS  . FLUoxetine  20 mg Oral Daily  . heparin  5,000 Units Subcutaneous 3 times per day  . insulin aspart  0-9 Units Subcutaneous TID WC  . insulin glargine  4 Units Subcutaneous QHS  . levothyroxine  50 mcg Oral QAC breakfast  . magnesium sulfate 1 - 4 g bolus IVPB  1 g Intravenous Once  . metoCLOPramide (REGLAN) injection  5 mg Intravenous 3 times per day  . omega-3 acid ethyl esters  1 g Oral Daily  . ondansetron  8 mg Oral Once  . pantoprazole (PROTONIX) IV  40 mg Intravenous Q24H  . potassium chloride  40 mEq Oral Once  . sodium chloride  3 mL Intravenous Q12H   Continuous Infusions: . 0.9 % NaCl with KCl 40 mEq / L     PRN Meds:.hydrALAZINE, hydrOXYzine, ondansetron (ZOFRAN) IV, polyethylene glycol  DVT Prophylaxis  Heparin   Lab Results  Component Value Date   PLT 137* 03/25/2015    Antibiotics      Anti-infectives    None          Objective:   Filed Vitals:   03/24/15 1851 03/24/15 1900 03/24/15 2000 03/24/15 2119  BP: 155/109 136/86 155/57 174/55  Pulse:  70 76  72  Temp:    98.4 F (36.9 C)  TempSrc:    Oral  Resp:  18 14 16   SpO2:  100% 100% 100%    Wt Readings from Last 3 Encounters:  08/29/14 60.8 kg (134 lb 0.6 oz)  05/15/14 58.333 kg (128 lb 9.6 oz)  05/08/14 57.425 kg (126 lb 9.6 oz)     Intake/Output Summary (Last 24 hours) at 03/25/15 0956 Last data filed at 03/24/15 1725  Gross per 24 hour  Intake    500 ml  Output      0 ml  Net    500 ml     Physical Exam  Awake Alert, Oriented X 3, No new F.N deficits, Normal affect [place].AT,PERRAL Supple Neck,No JVD, No cervical lymphadenopathy appriciated.  Symmetrical Chest wall movement, Good air movement bilaterally, CTAB RRR,No Gallops,Rubs or new Murmurs, No Parasternal Heave +ve B.Sounds, Abd Soft, No tenderness, No organomegaly appriciated, No rebound -  guarding or rigidity. No Cyanosis, Clubbing or edema, No new Rash or bruise    Data Review   Micro Results No results found for this or any previous visit (from the past 240 hour(s)).  Radiology Reports Ct Abdomen Pelvis W Contrast  03/25/2015   CLINICAL DATA:  Epigastric pain with nausea vomiting for 4 days.  EXAM: CT ABDOMEN AND PELVIS WITH CONTRAST  TECHNIQUE: Multidetector CT imaging of the abdomen and pelvis was performed using the standard protocol following bolus administration of intravenous contrast.  CONTRAST:  41mL OMNIPAQUE IOHEXOL 300 MG/ML SOLN, 123mL OMNIPAQUE IOHEXOL 300 MG/ML SOLN  COMPARISON:  05/16/2014.  FINDINGS: No lung base consolidation or nodule.  Heart is normal in size.  Small calcifications in the liver, stable consistent with healed granulomas. Liver otherwise unremarkable.  Normal spleen, gallbladder and pancreas. No bile duct dilation. No adrenal masses.  Normal kidneys, ureters and bladder.  Uterus is surgically absent.  No pelvic masses.  No pathologically enlarged lymph nodes. No abnormal fluid collections.  There is calcification along the aorta, most notable at the origin of the left renal artery. No aneurysm.  Colon and small bowel are unremarkable.  There has been previous lumbar surgery with low lumbar spine laminectomies. Degenerative changes are noted throughout the visualized spine. No osteoblastic or osteolytic lesions.  IMPRESSION: 1. No acute findings.  No findings to explain epigastric pain. 2. Status post hysterectomy and changes from lumbar spine surgery as well as prominent degenerative changes of the lumbar spine.   Electronically Signed   By: Lajean Manes M.D.   On: 03/25/2015 09:02   Dg Abd Acute W/chest  03/24/2015   CLINICAL DATA:  Nausea and vomiting for 1 week.  EXAM: DG ABDOMEN ACUTE W/ 1V CHEST  COMPARISON:  Abdominal radiographs 08/31/2014. Three-way abdominal series [id number].  FINDINGS: The cardiomediastinal silhouette is within normal limits.  The lungs are well inflated and clear. There is no evidence of pleural effusion or pneumothorax. Right humeral head suture anchors are noted.  There is no evidence of intraperitoneal free air. No sizable air-fluid levels are seen in the abdomen. Gas and stool are scattered throughout nondilated colon. There is a relative paucity of small bowel gas without dilated loops of bowel seen to suggest obstruction. Lumbar spondylosis is noted.  IMPRESSION: Negative abdominal radiographs.  No acute cardiopulmonary disease.   Electronically Signed   By: Logan Bores   On: 03/24/2015 17:14     CBC  Recent Labs Lab 03/24/15 1528 03/25/15 0325  WBC 5.1 5.9  HGB  14.1 12.0  HCT 39.9 34.3*  PLT 143* 137*  MCV 87.3 88.6  MCH 30.9 31.0  MCHC 35.3 35.0  RDW 12.4 12.5  LYMPHSABS 1.4  --   MONOABS 0.4  --   EOSABS 0.0  --   BASOSABS 0.0  --     Chemistries   Recent Labs Lab 03/24/15 1528 03/25/15 0325  NA 137 140  K 3.2* 3.1*  CL 101 106  CO2 25 28  GLUCOSE 216* 104*  BUN 16 12  CREATININE 0.59 0.55  CALCIUM 9.5 8.5*  AST 18 13*  ALT 15 10*  ALKPHOS 89 69  BILITOT 1.2 1.0   ------------------------------------------------------------------------------------------------------------------ CrCl cannot be calculated (Unknown ideal weight.). ------------------------------------------------------------------------------------------------------------------ No results for input(s): HGBA1C in the last 72 hours. ------------------------------------------------------------------------------------------------------------------  Recent Labs  03/25/15 0325  CHOL 178  HDL 46  LDLCALC 109*  TRIG 116  CHOLHDL 3.9   ------------------------------------------------------------------------------------------------------------------  Recent Labs  03/25/15 0325  TSH 1.588   ------------------------------------------------------------------------------------------------------------------ No  results for input(s): VITAMINB12, FOLATE, FERRITIN, TIBC, IRON, RETICCTPCT in the last 72 hours.  Coagulation profile  Recent Labs Lab 03/24/15 2200  INR 1.06    No results for input(s): DDIMER in the last 72 hours.  Cardiac Enzymes  Recent Labs Lab 03/24/15 2122 03/25/15 0325  TROPONINI <0.03 <0.03   ------------------------------------------------------------------------------------------------------------------ Invalid input(s): POCBNP     Time Spent in minutes   35   Kinsley Nicklaus K M.D on 03/25/2015 at 9:56 AM  Between 7am to 7pm - Pager - [phone]  After 7pm go to [email or web address] - password [facility]  [facility]   Office  [phone]

## 2015-03-26 DIAGNOSIS — R1110 Vomiting, unspecified: Secondary | ICD-10-CM | POA: Diagnosis not present

## 2015-03-26 DIAGNOSIS — E1165 Type 2 diabetes mellitus with hyperglycemia: Secondary | ICD-10-CM | POA: Diagnosis not present

## 2015-03-26 DIAGNOSIS — E039 Hypothyroidism, unspecified: Secondary | ICD-10-CM

## 2015-03-26 DIAGNOSIS — R109 Unspecified abdominal pain: Secondary | ICD-10-CM

## 2015-03-26 DIAGNOSIS — I10 Essential (primary) hypertension: Secondary | ICD-10-CM | POA: Diagnosis not present

## 2015-03-26 LAB — GLUCOSE, CAPILLARY
GLUCOSE-CAPILLARY: 113 mg/dL — AB (ref 70–99)
Glucose-Capillary: 122 mg/dL — ABNORMAL HIGH (ref 70–99)

## 2015-03-26 LAB — POTASSIUM: Potassium: 3.4 mmol/L — ABNORMAL LOW (ref 3.5–5.1)

## 2015-03-26 LAB — MAGNESIUM: MAGNESIUM: 1.6 mg/dL — AB (ref 1.7–2.4)

## 2015-03-26 MED ORDER — PANTOPRAZOLE SODIUM 40 MG PO TBEC: 40.0000 mg | DELAYED_RELEASE_TABLET | Freq: Every day | ORAL | Status: DC

## 2015-03-26 MED ORDER — MAGNESIUM SULFATE 2 GM/50ML IV SOLN
2.0000 g | Freq: Once | INTRAVENOUS | Status: AC
  Administered 2015-03-26: 2 g via INTRAVENOUS
  Filled 2015-03-26: qty 50

## 2015-03-26 MED ORDER — BOOST / RESOURCE BREEZE PO LIQD
1.0000 | Freq: Two times a day (BID) | ORAL | Status: DC
  Administered 2015-03-26: 1 via ORAL

## 2015-03-26 MED ORDER — METOCLOPRAMIDE HCL 5 MG PO TABS: 5.0000 mg | ORAL_TABLET | Freq: Three times a day (TID) | ORAL | Status: DC

## 2015-03-26 MED ORDER — POTASSIUM CHLORIDE CRYS ER 20 MEQ PO TBCR
40.0000 meq | EXTENDED_RELEASE_TABLET | ORAL | Status: AC
  Administered 2015-03-26 (×2): 40 meq via ORAL
  Filled 2015-03-26: qty 2

## 2015-03-26 MED ORDER — PROMETHAZINE HCL 25 MG RE SUPP: 25.0000 mg | Freq: Four times a day (QID) | RECTAL | Status: DC | PRN

## 2015-03-26 NOTE — Discharge Summary (Signed)
Sara Graves, is a 74 y.o. female  DOB 10-Nov-1941  MRN [id number].  Admission date:  03/24/2015  Admitting Physician  Ivor Costa, MD  Discharge Date:  03/26/2015   Primary MD  [name], PA-C  Recommendations for primary care physician for things to follow:   Monitor symptoms of nausea, BMP, magnesium level, glycemic control closely.   Admission Diagnosis  Essential hypertension [I10] Intractable vomiting with nausea, vomiting of unspecified type [R11.10]   Discharge Diagnosis  Essential hypertension [I10] Intractable vomiting with nausea, vomiting of unspecified type [R11.10]    Principal Problem:   Intractable nausea and vomiting Active Problems:   Hypothyroidism   DM (diabetes mellitus), type 2, uncontrolled   HYPERCHOLESTEROLEMIA   Depression   Diabetic neuropathy   Left rotator cuff tear   Neuropathy of leg   Protein-calorie malnutrition, severe   Hypokalemia   Thrombocytopenia   Vomiting   Essential hypertension   Abdominal pain      Past Medical History  Diagnosis Date  . H. pylori infection 2008 and 1998    treated  . Multinodular goiter   . Hx of colonoscopy with polypectomy   . Depression   . Hyperlipidemia   . Hypertension   . Arthritis   . Urge urinary incontinence   . Diabetes mellitus, type II, insulin dependent   . Rotator cuff tear, left recurrent   . Bursitis of left shoulder   . Hypothyroidism     Past Surgical History  Procedure Laterality Date  . 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 Lipoma excision      RIGHT ELBOW  . Excision ganglion cyst left ring finger  01-17-2009  . Implantation permanent spinal cord stimulator  06-15-2008    JUNE 2013--  BATTERY CHANGE  . Foraminal decompression at l2 to the sacrum  01-05-2008    L2  -  S1  .  Rotator cuff repair  03-28-2003    RIGHT SHOULDER  DEGENERATIVE AC JOINT AND RC TEAR  . Bladder neck suspension  1970'S  . Bilateral bunionectomies    . Hammertoe repair      BILATERAL  . Vaginal hysterectomy  1970'S  . Appendectomy  AGE 69  . Tonsillectomy  AGE 69  . Shoulder open rotator cuff repair  09/07/2012    Procedure: ROTATOR CUFF REPAIR SHOULDER OPEN;  Surgeon: Magnus Sinning, MD;  Location: [place];  Service: Orthopedics;  Laterality: Left;  OPEN ANTERIOR ACROMIONECTOMY AND ROTATOR CUFF REPAIR ON LEFT   . Shoulder open rotator cuff repair Left 12/28/2012    Procedure: ROTATOR CUFF REPAIR SHOULDER OPEN;  Surgeon: Magnus Sinning, MD;  Location: [place];  Service: Orthopedics;  Laterality: Left;  OPEN SHOULDER ROTATOR CUFF REPAIR ON LEFT  WITH ANTERIOR ACROMINECTOMY        History of present illness and  Hospital Course:     Kindly see H&P for history of present illness and admission details, please review complete Labs,  Consult reports and Test reports for all details in brief  HPI  from the history and physical done on the day of admission  Sara Graves is a 74 y.o. female with PMH of hypertension, hyperlipidemia, hypothyroidism, depression, arthritis, left shoulder rotator cuff, who presents with intractable nausea and vomiting.  Patient reports that she has chronic nausea and vomiting. She was hospitalized in last October, and was suspected to have possible gastritis versus gastroparesis due to poorly controlled diabetes. Her nausea and vomiting are usually mild and tolerable, but in the past 4 days, her symptoms have been progressively getting worse. She vomited many times, and cannot keep food down. She has mild epigastric abdominal pain, no diarrhea. She reports that she was seen by GI, Dr. Arna Medici and had 2 negative gastric emptying studies on 07/22/2013 and 06/09/2014 per patient. She reports that she did not have a bowel movement  in the past 3 days. She takes Aleve regularly for left shoulder pain.  Currently patient denies fever, chills, running nose, ear pain, headaches, cough, chest pain, SOB, diarrhea, dysuria, urgency, frequency, hematuria, skin rashes and leg swelling. No unilateral weakness, numbness or tingling sensations. No vision change or hearing loss.  In ED, patient was found to have negative urinalysis, negative troponin, WBC 5.1, potassium 3.2, renal function okay, lipase 17, temperature normal, no tachycardia. X-ray of chest/Abd negative. The patient is admitted to inpatient for further evaluation and treatment   Hospital Course    1. Recurrent nausea vomiting. Unclear etiology has been following with GI physician Dr. Amedeo Plenty on a close basis, 2 negative previous gastric emptying studies. CT scan abdomen pelvis here unremarkable. Does have uncontrolled diabetes. In the past claims that Reglan had helped, question mild intermittent gastroparesis versus gastritis due to NSAID use, she was placed on PPI along with pre-meal Reglan 5 mg with good control of her nausea, now tolerating diet, eager to go home. Symptom-free for the last 24 hours. Will be discharged with close PCP and GI follow-up.   2. DM2 - poor outpatient control, Last A1c was 9. Continue present regimen and monitor CBGs. Monitor A1c in the outpatient setting.        . Lab Results  Component Value Date   HGBA1C 9.0* 08/29/2014     CBG (last 3)   Recent Labs (last 2 labs)      Recent Labs  03/24/15 1858 03/24/15 2154 03/25/15 0829  GLUCAP 176* 152* 107*      3. Hypothyroidism. Continue Synthroid.    4. Chronic mild thrombocytopenia. Stable no acute issues.    5. Dyslipidemia. On statin continue. Outpatient follow-up with PCP.    6. Mildly prolonged QTC due to hypokalemia. Stable after potassium was replaced.    7. History of depression. On Prozac continue. Not suicidal homicidal    8. Hypokalemia.  Replaced potassium, request PCP to recheck BMP and magnesium in a week. Hypomagnesemia, magnesium was replaced IV as well.         Discharge Condition: Stable   Follow UP  Follow-up Information    Follow up with SPENCER,SARA C, PA-C. Schedule an appointment as soon as possible for a visit in 1 week.   Specialty:  Physician Assistant   Contact information:   [place] [address] [phone]       Follow up with HAYES,JOHN C, MD. Schedule an appointment as soon as possible for a visit in 1 week.   Specialty:  Gastroenterology   Contact information:  [address] [place] [address] [phone]         Discharge Instructions  and  Discharge Medications      Discharge Instructions    Discharge instructions    Complete by:  As directed   Follow with Primary MD SPENCER,SARA C, PA-C in 7 days   Get CBC, CMP, 2 view Chest X ray checked  by Primary MD next visit.    Activity: As tolerated with Full fall precautions use walker/cane & assistance as needed   Disposition Home     Diet: Soft  Accuchecks 4 times/day, Once in AM empty stomach and then before each meal. Log in all results and show them to your Prim.MD in 3 days. If any glucose reading is under 80 or above 300 call your Prim MD immidiately. Follow Low glucose instructions for glucose under 80 as instructed.   For Heart failure patients - Check your Weight same time everyday, if you gain over 2 pounds, or you develop in leg swelling, experience more shortness of breath or chest pain, call your Primary MD immediately. Follow Cardiac Low Salt Diet and 1.5 lit/day fluid restriction.   On your next visit with your primary care physician please Get Medicines reviewed and adjusted.   Please request your Prim.MD to go over all Hospital Tests and Procedure/Radiological results at the follow up, please get all Hospital records sent to your Prim MD by signing hospital release  before you go home.   If you experience worsening of your admission symptoms, develop shortness of breath, life threatening emergency, suicidal or homicidal thoughts you must seek medical attention immediately by calling 911 or calling your MD immediately  if symptoms less severe.  You Must read complete instructions/literature along with all the possible adverse reactions/side effects for all the Medicines you take and that have been prescribed to you. Take any new Medicines after you have completely understood and accpet all the possible adverse reactions/side effects.   Do not drive, operating heavy machinery, perform activities at heights, swimming or participation in water activities or provide baby sitting services if your were admitted for syncope or siezures until you have seen by Primary MD or a Neurologist and advised to do so again.  Do not drive when taking Pain medications.    Do not take more than prescribed Pain, Sleep and Anxiety Medications  Special Instructions: If you have smoked or chewed Tobacco  in the last 2 yrs please stop smoking, stop any regular Alcohol  and or any Recreational drug use.  Wear Seat belts while driving.   Please note  You were cared for by a hospitalist during your hospital stay. If you have any questions about your discharge medications or the care you received while you were in the hospital after you are discharged, you can call the unit and asked to speak with the hospitalist on call if the hospitalist that took care of you is not available. Once you are discharged, your primary care physician will handle any further medical issues. Please note that NO REFILLS for any discharge medications will be authorized once you are discharged, as it is imperative that you return to your primary care physician (or establish a relationship with a primary care physician if you do not have one) for your aftercare needs so that they can reassess your need for  medications and monitor your lab values.     Increase activity slowly    Complete by:  As directed  Medication List    TAKE these medications        atorvastatin 20 MG tablet  Commonly known as:  LIPITOR  Take 20 mg by mouth at bedtime.     BAYER CHILDRENS ASPIRIN 81 MG chewable tablet  Generic drug:  aspirin  Chew 81 mg by mouth daily.     Fish Oil 1000 MG Caps  Take 1,000 mg by mouth 2 (two) times daily. OTC fish oil     FLUoxetine 20 MG capsule  Commonly known as:  PROZAC  Take 20 mg by mouth daily.     insulin glargine 100 UNIT/ML injection  Commonly known as:  LANTUS  Inject 0.04 mLs (4 Units total) into the skin 2 (two) times daily.     levothyroxine 50 MCG tablet  Commonly known as:  SYNTHROID, LEVOTHROID  Take 50 mcg by mouth every morning.     losartan-hydrochlorothiazide 100-25 MG per tablet  Commonly known as:  HYZAAR  Take 1 tablet by mouth daily.     metoCLOPramide 5 MG tablet  Commonly known as:  REGLAN  Take 1 tablet (5 mg total) by mouth 3 (three) times daily before meals.     pantoprazole 40 MG tablet  Commonly known as:  PROTONIX  Take 1 tablet (40 mg total) by mouth daily. Switch for any other PPI at similar dose and frequency     promethazine 25 MG suppository  Commonly known as:  PHENERGAN  Place 1 suppository (25 mg total) rectally every 6 (six) hours as needed for nausea or vomiting.     repaglinide 2 MG tablet  Commonly known as:  PRANDIN  Take 2 mg by mouth 3 (three) times daily before meals.          Diet and Activity recommendation: See Discharge Instructions above   Consults obtained - None   Major procedures and Radiology Reports - PLEASE review detailed and final reports for all details, in brief -       Ct Abdomen Pelvis W Contrast  03/25/2015   CLINICAL DATA:  Epigastric pain with nausea vomiting for 4 days.  EXAM: CT ABDOMEN AND PELVIS WITH CONTRAST  TECHNIQUE: Multidetector CT imaging of the abdomen  and pelvis was performed using the standard protocol following bolus administration of intravenous contrast.  CONTRAST:  16mL OMNIPAQUE IOHEXOL 300 MG/ML SOLN, 139mL OMNIPAQUE IOHEXOL 300 MG/ML SOLN  COMPARISON:  05/16/2014.  FINDINGS: No lung base consolidation or nodule.  Heart is normal in size.  Small calcifications in the liver, stable consistent with healed granulomas. Liver otherwise unremarkable.  Normal spleen, gallbladder and pancreas. No bile duct dilation. No adrenal masses.  Normal kidneys, ureters and bladder.  Uterus is surgically absent.  No pelvic masses.  No pathologically enlarged lymph nodes. No abnormal fluid collections.  There is calcification along the aorta, most notable at the origin of the left renal artery. No aneurysm.  Colon and small bowel are unremarkable.  There has been previous lumbar surgery with low lumbar spine laminectomies. Degenerative changes are noted throughout the visualized spine. No osteoblastic or osteolytic lesions.  IMPRESSION: 1. No acute findings.  No findings to explain epigastric pain. 2. Status post hysterectomy and changes from lumbar spine surgery as well as prominent degenerative changes of the lumbar spine.   Electronically Signed   By: Lajean Manes M.D.   On: 03/25/2015 09:02   Dg Abd Acute W/chest  03/24/2015   CLINICAL DATA:  Nausea and vomiting for 1 week.  EXAM:  DG ABDOMEN ACUTE W/ 1V CHEST  COMPARISON:  Abdominal radiographs 08/31/2014. Three-way abdominal series [id number].  FINDINGS: The cardiomediastinal silhouette is within normal limits. The lungs are well inflated and clear. There is no evidence of pleural effusion or pneumothorax. Right humeral head suture anchors are noted.  There is no evidence of intraperitoneal free air. No sizable air-fluid levels are seen in the abdomen. Gas and stool are scattered throughout nondilated colon. There is a relative paucity of small bowel gas without dilated loops of bowel seen to suggest obstruction. Lumbar  spondylosis is noted.  IMPRESSION: Negative abdominal radiographs.  No acute cardiopulmonary disease.   Electronically Signed   By: Logan Bores   On: 03/24/2015 17:14    Micro Results      No results found for this or any previous visit (from the past 240 hour(s)).     Today   Subjective:   Sara Graves today has no headache,no chest abdominal pain,no new weakness tingling or numbness, feels much better wants to go home today.    Objective:   Blood pressure 168/47, pulse 74, temperature 98 F (36.7 C), temperature source Oral, resp. rate 18, height 5\' 4"  (1.626 m), weight 68.04 kg (150 lb), SpO2 98 %.   Intake/Output Summary (Last 24 hours) at 03/26/15 1530 Last data filed at 03/26/15 1215  Gross per 24 hour  Intake   1500 ml  Output      0 ml  Net   1500 ml    Exam Awake Alert, Oriented x 3, No new F.N deficits, Normal affect [place].AT,PERRAL Supple Neck,No JVD, No cervical lymphadenopathy appriciated.  Symmetrical Chest wall movement, Good air movement bilaterally, CTAB RRR,No Gallops,Rubs or new Murmurs, No Parasternal Heave +ve B.Sounds, Abd Soft, Non tender, No organomegaly appriciated, No rebound -guarding or rigidity. No Cyanosis, Clubbing or edema, No new Rash or bruise  Data Review   CBC w Diff: Lab Results  Component Value Date   WBC 5.9 03/25/2015   HGB 12.0 03/25/2015   HCT 34.3* 03/25/2015   PLT 137* 03/25/2015   LYMPHOPCT 27 03/24/2015   MONOPCT 8 03/24/2015   EOSPCT 1 03/24/2015   BASOPCT 1 03/24/2015    CMP: Lab Results  Component Value Date   NA 140 03/25/2015   NA 137 05/26/2013   K 3.4* 03/26/2015   CL 106 03/25/2015   CO2 28 03/25/2015   BUN 12 03/25/2015   BUN 11 05/26/2013   CREATININE 0.55 03/25/2015   CREATININE 0.62 04/06/2014   PROT 6.0* 03/25/2015   PROT 6.5 05/26/2013   ALBUMIN 3.4* 03/25/2015   BILITOT 1.0 03/25/2015   ALKPHOS 69 03/25/2015   AST 13* 03/25/2015   ALT 10* 03/25/2015  .   Total Time in preparing  paper work, data evaluation and todays exam - 35 minutes  Thurnell Lose M.D on 03/26/2015 at 3:30 PM  [facility]   Office  [phone]

## 2015-03-26 NOTE — Evaluation (Signed)
Physical Therapy Evaluation Patient Details Name: Sara Graves MRN: [id number] DOB: August 29, 1941 Today's Date: 03/26/2015   History of Present Illness  74 y.o. female with PMH of hypertension, hyperlipidemia, hypothyroidism, depression, arthritis, left shoulder rotator cuff, who presents with intractable nausea and vomiting.  Clinical Impression  Sara Graves is independent with mobility, she walked 270' without loss of balance. No further PT indicated. She is safe to DC home from PT standpoint. PT signing off.     Follow Up Recommendations No PT follow up    Equipment Recommendations  None recommended by PT    Recommendations for Other Services       Precautions / Restrictions Precautions Precautions: None Restrictions Weight Bearing Restrictions: No      Mobility  Bed Mobility Overal bed mobility: Independent                Transfers Overall transfer level: Independent                  Ambulation/Gait Ambulation/Gait assistance: Independent Ambulation Distance (Feet): 270 Feet Assistive device: None Gait Pattern/deviations: WFL(Within Functional Limits)   Gait velocity interpretation: at or above normal speed for age/gender General Gait Details: steady, no LOB  Stairs            Wheelchair Mobility    Modified Rankin (Stroke Patients Only)       Balance Overall balance assessment: Independent                                           Pertinent Vitals/Pain Pain Assessment: No/denies pain    Home Living Family/patient expects to be discharged to:: Private residence Living Arrangements: Spouse/significant other;Children Available Help at Discharge: Family;Available 24 hours/day Type of Home: House Home Access: Ramped entrance     Home Layout: One level   Additional Comments: son lives with pt, he is handicapped, pt feeds, dresses, bathes him, he is in a WC, has [employer] fast streamer    Prior Function Level of  Independence: Independent               Hand Dominance        Extremity/Trunk Assessment   Upper Extremity Assessment: Overall WFL for tasks assessed           Lower Extremity Assessment: Overall WFL for tasks assessed      Cervical / Trunk Assessment: Normal  Communication   Communication: No difficulties  Cognition Arousal/Alertness: Awake/alert Behavior During Therapy: WFL for tasks assessed/performed Overall Cognitive Status: Within Functional Limits for tasks assessed                      General Comments      Exercises        Assessment/Plan    PT Assessment Patent does not need any further PT services  PT Diagnosis     PT Problem List    PT Treatment Interventions     PT Goals (Current goals can be found in the Care Plan section) Acute Rehab PT Goals Patient Stated Goal: return home to care for her disabled son PT Goal Formulation: All assessment and education complete, DC therapy    Frequency     Barriers to discharge        Co-evaluation               End of Session Equipment Utilized During  Treatment: Gait belt Activity Tolerance: Patient tolerated treatment well Patient left: in chair;with call bell/phone within reach Nurse Communication: Mobility status         Time: 1045-1105 PT Time Calculation (min) (ACUTE ONLY): 20 min   Charges:   PT Evaluation $Initial PT Evaluation Tier I: 1 Procedure     PT G CodesBlondell Reveal Kistler 03/26/2015, 11:11 AM [phone]

## 2015-03-26 NOTE — Discharge Instructions (Signed)
Follow with Primary MD SPENCER,SARA C, PA-C in 7 days   Get CBC, CMP, 2 view Chest X ray checked  by Primary MD next visit.    Activity: As tolerated with Full fall precautions use walker/cane & assistance as needed   Disposition Home     Diet: Soft  For Heart failure patients - Check your Weight same time everyday, if you gain over 2 pounds, or you develop in leg swelling, experience more shortness of breath or chest pain, call your Primary MD immediately. Follow Cardiac Low Salt Diet and 1.5 lit/day fluid restriction.   On your next visit with your primary care physician please Get Medicines reviewed and adjusted.   Please request your Prim.MD to go over all Hospital Tests and Procedure/Radiological results at the follow up, please get all Hospital records sent to your Prim MD by signing hospital release before you go home.   If you experience worsening of your admission symptoms, develop shortness of breath, life threatening emergency, suicidal or homicidal thoughts you must seek medical attention immediately by calling 911 or calling your MD immediately  if symptoms less severe.  You Must read complete instructions/literature along with all the possible adverse reactions/side effects for all the Medicines you take and that have been prescribed to you. Take any new Medicines after you have completely understood and accpet all the possible adverse reactions/side effects.   Do not drive, operating heavy machinery, perform activities at heights, swimming or participation in water activities or provide baby sitting services if your were admitted for syncope or siezures until you have seen by Primary MD or a Neurologist and advised to do so again.  Do not drive when taking Pain medications.    Do not take more than prescribed Pain, Sleep and Anxiety Medications  Special Instructions: If you have smoked or chewed Tobacco  in the last 2 yrs please stop smoking, stop any regular Alcohol   and or any Recreational drug use.  Wear Seat belts while driving.   Please note  You were cared for by a hospitalist during your hospital stay. If you have any questions about your discharge medications or the care you received while you were in the hospital after you are discharged, you can call the unit and asked to speak with the hospitalist on call if the hospitalist that took care of you is not available. Once you are discharged, your primary care physician will handle any further medical issues. Please note that NO REFILLS for any discharge medications will be authorized once you are discharged, as it is imperative that you return to your primary care physician (or establish a relationship with a primary care physician if you do not have one) for your aftercare needs so that they can reassess your need for medications and monitor your lab values.

## 2015-03-26 NOTE — Progress Notes (Signed)
Initial Nutrition Assessment  DOCUMENTATION CODES:  Non-severe (moderate) malnutrition in context of chronic illness  INTERVENTION:  Boost Breeze BID  NUTRITION DIAGNOSIS:  Malnutrition related to chronic illness as evidenced by mild depletion of muscle mass, mild depletion of body fat.  GOAL:  Patient will meet greater than or equal to 90% of their needs  MONITOR:  PO intake, Diet advancement, Labs, Weight trends, I & O's  REASON FOR ASSESSMENT:  Malnutrition Screening Tool    ASSESSMENT: Pt is a 74 y.o. female with PMH of hypertension, hyperlipidemia, hypothyroidism, depression, arthritis, left shoulder rotator cuff, who presents with intractable nausea and vomiting.  Pt states she has been having bouts of N/V on and off for about a year, but unsure of the cause. Pt's appetite varies with these episodes and her usual weight is about 130Lb she has gained 16 Lb since October. During last hospitalization she received Boost Breeze, but is unable to find it in stores. Provided information where it can be purchased, will order while here. 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 Encouraged eating several small meals and snacks every day and to check CBG regularly. Per pt, her diabetes medication has been adjusted several month ago and her A1c has been improving. Labs reviewed: Glu 122, K 3.1, Ca 8.5  Height:  Ht Readings from Last 1 Encounters:  03/25/15 5\' 4"  (1.626 m)    Weight:  Wt Readings from Last 1 Encounters:  03/25/15 150 lb (68.04 kg)    Ideal Body Weight:  54.55 kg  Wt Readings from Last 10 Encounters:  03/25/15 150 lb (68.04 kg)  08/29/14 134 lb 0.6 oz (60.8 kg)  05/15/14 128 lb 9.6 oz (58.333 kg)  05/08/14 126 lb 9.6 oz (57.425 kg)  05/03/14 132 lb (59.875 kg)   04/06/14 133 lb (60.328 kg)  05/26/13 138 lb (62.596 kg)  04/27/13 143 lb 4.8 oz (65 kg)  03/25/13 147 lb (66.679 kg)  03/16/13 144 lb 8 oz (65.545 kg)    BMI:  Body mass index is 25.73 kg/(m^2).  Estimated Nutritional Needs:  Kcal:  1500 - 1700  Protein:  70 - 80 g  Fluid:  1.6 L  Skin:  Reviewed, no issues  Diet Order:  Diet full liquid Room service appropriate?: Yes; Fluid consistency:: Thin  EDUCATION NEEDS:  Education needs addressed   Intake/Output Summary (Last 24 hours) at 03/26/15 1236 Last data filed at 03/26/15 1215  Gross per 24 hour  Intake   1500 ml  Output      0 ml  Net   1500 ml    Last BM:  5/06  Ayham Word A. Turner Dietetic Intern Pager: [phone] 03/26/2015 12:36 PM

## 2015-03-26 NOTE — Progress Notes (Signed)
Sara Graves to be D/C'd Home per MD order.  Discussed prescriptions and follow up appointments with the patient. Prescriptions given to patient, medication list explained in detail. Pt verbalized understanding.    Medication List    TAKE these medications        atorvastatin 20 MG tablet  Commonly known as:  LIPITOR  Take 20 mg by mouth at bedtime.     BAYER CHILDRENS ASPIRIN 81 MG chewable tablet  Generic drug:  aspirin  Chew 81 mg by mouth daily.     Fish Oil 1000 MG Caps  Take 1,000 mg by mouth 2 (two) times daily. OTC fish oil     FLUoxetine 20 MG capsule  Commonly known as:  PROZAC  Take 20 mg by mouth daily.     insulin glargine 100 UNIT/ML injection  Commonly known as:  LANTUS  Inject 0.04 mLs (4 Units total) into the skin 2 (two) times daily.     levothyroxine 50 MCG tablet  Commonly known as:  SYNTHROID, LEVOTHROID  Take 50 mcg by mouth every morning.     losartan-hydrochlorothiazide 100-25 MG per tablet  Commonly known as:  HYZAAR  Take 1 tablet by mouth daily.     metoCLOPramide 5 MG tablet  Commonly known as:  REGLAN  Take 1 tablet (5 mg total) by mouth 3 (three) times daily before meals.     pantoprazole 40 MG tablet  Commonly known as:  PROTONIX  Take 1 tablet (40 mg total) by mouth daily. Switch for any other PPI at similar dose and frequency     promethazine 25 MG suppository  Commonly known as:  PHENERGAN  Place 1 suppository (25 mg total) rectally every 6 (six) hours as needed for nausea or vomiting.     repaglinide 2 MG tablet  Commonly known as:  PRANDIN  Take 2 mg by mouth 3 (three) times daily before meals.        Filed Vitals:   03/26/15 1407  BP: 168/47  Pulse: 74  Temp: 98 F (36.7 C)  Resp: 18    Skin clean, dry and intact without evidence of skin break down, no evidence of skin tears noted. IV catheter discontinued intact. Site without signs and symptoms of complications. Dressing and pressure applied. Pt denies pain at this  time. No complaints noted.  An After Visit Summary was printed and given to the patient. Patient escorted via [place], and D/C home via private auto.  Lolita Rieger 03/26/2015 3:53 PM

## 2015-03-26 NOTE — Progress Notes (Signed)
OT Cancellation Note  Patient Details Name: Sara Graves MRN: [id number] DOB: [birth year]   Cancelled Treatment:    Reason Eval/Treat Not Completed: OT screened, no needs identified, will sign off. Pt states she has no concerns with ADL for home. She is independent with PT. Will sign off per pt request.  Jules Schick  [id number] 03/26/2015, 11:38 AM

## 2015-03-27 NOTE — Progress Notes (Signed)
   03/26/15 1111  PT G-Codes **NOT FOR INPATIENT CLASS**  Functional Assessment Tool Used clinical judgement  Functional Limitation Mobility: Walking and moving around  Mobility: Walking and Moving Around Current Status [id number]) CH  Mobility: Walking and Moving Around Goal Status [id number]) CH  Mobility: Walking and Moving Around Discharge Status [phone]) [place]  G codes for Blondell Reveal PT evaluation

## 2015-05-03 ENCOUNTER — Emergency Department (HOSPITAL_COMMUNITY)
Admission: EM | Admit: 2015-05-03 | Discharge: 2015-05-03 | Disposition: A | Discharge status: Home / Self Care | Payer: Medicare Other | Attending: Emergency Medicine | Admitting: Emergency Medicine

## 2015-05-03 ENCOUNTER — Encounter (HOSPITAL_COMMUNITY): Payer: Self-pay | Admitting: Family Medicine

## 2015-05-03 DIAGNOSIS — E876 Hypokalemia: Secondary | ICD-10-CM

## 2015-05-03 DIAGNOSIS — M1990 Unspecified osteoarthritis, unspecified site: Secondary | ICD-10-CM | POA: Diagnosis not present

## 2015-05-03 DIAGNOSIS — I10 Essential (primary) hypertension: Secondary | ICD-10-CM | POA: Insufficient documentation

## 2015-05-03 DIAGNOSIS — Z79899 Other long term (current) drug therapy: Secondary | ICD-10-CM | POA: Insufficient documentation

## 2015-05-03 DIAGNOSIS — Z7982 Long term (current) use of aspirin: Secondary | ICD-10-CM | POA: Insufficient documentation

## 2015-05-03 DIAGNOSIS — Z8619 Personal history of other infectious and parasitic diseases: Secondary | ICD-10-CM | POA: Insufficient documentation

## 2015-05-03 DIAGNOSIS — Z87448 Personal history of other diseases of urinary system: Secondary | ICD-10-CM | POA: Insufficient documentation

## 2015-05-03 DIAGNOSIS — F329 Major depressive disorder, single episode, unspecified: Secondary | ICD-10-CM | POA: Diagnosis not present

## 2015-05-03 DIAGNOSIS — E119 Type 2 diabetes mellitus without complications: Secondary | ICD-10-CM | POA: Diagnosis not present

## 2015-05-03 DIAGNOSIS — G43A0 Cyclical vomiting, not intractable: Secondary | ICD-10-CM | POA: Diagnosis not present

## 2015-05-03 DIAGNOSIS — R1013 Epigastric pain: Secondary | ICD-10-CM | POA: Diagnosis not present

## 2015-05-03 DIAGNOSIS — E785 Hyperlipidemia, unspecified: Secondary | ICD-10-CM | POA: Insufficient documentation

## 2015-05-03 DIAGNOSIS — R1115 Cyclical vomiting syndrome unrelated to migraine: Secondary | ICD-10-CM

## 2015-05-03 DIAGNOSIS — E039 Hypothyroidism, unspecified: Secondary | ICD-10-CM | POA: Insufficient documentation

## 2015-05-03 DIAGNOSIS — R112 Nausea with vomiting, unspecified: Secondary | ICD-10-CM | POA: Diagnosis present

## 2015-05-03 DIAGNOSIS — Z794 Long term (current) use of insulin: Secondary | ICD-10-CM | POA: Diagnosis not present

## 2015-05-03 LAB — COMPREHENSIVE METABOLIC PANEL
ALBUMIN: 4.4 g/dL (ref 3.5–5.0)
ALK PHOS: 78 U/L (ref 38–126)
ALT: 11 U/L — AB (ref 14–54)
ANION GAP: 12 (ref 5–15)
AST: 14 U/L — ABNORMAL LOW (ref 15–41)
BILIRUBIN TOTAL: 1.3 mg/dL — AB (ref 0.3–1.2)
BUN: 24 mg/dL — AB (ref 6–20)
CHLORIDE: 95 mmol/L — AB (ref 101–111)
CO2: 30 mmol/L (ref 22–32)
Calcium: 9.5 mg/dL (ref 8.9–10.3)
Creatinine, Ser: 0.9 mg/dL (ref 0.44–1.00)
GFR calc Af Amer: >60 mL/min (ref >60)
GFR calc non Af Amer: >60 mL/min (ref >60)
Glucose, Bld: 190 mg/dL — ABNORMAL HIGH (ref 65–99)
Potassium: 2.9 mmol/L — ABNORMAL LOW (ref 3.5–5.1)
SODIUM: 137 mmol/L (ref 135–145)
Total Protein: 7.2 g/dL (ref 6.5–8.1)

## 2015-05-03 LAB — CBC WITH DIFFERENTIAL/PLATELET
Basophils Absolute: 0 10*3/uL (ref 0.0–0.1)
Basophils Relative: 0 % (ref 0–1)
EOS ABS: 0.1 10*3/uL (ref 0.0–0.7)
Eosinophils Relative: 1 % (ref 0–5)
HEMATOCRIT: 37.5 % (ref 36.0–46.0)
Hemoglobin: 13.2 g/dL (ref 12.0–15.0)
LYMPHS PCT: 27 % (ref 12–46)
Lymphs Abs: 1.5 10*3/uL (ref 0.7–4.0)
MCH: 31.1 pg (ref 26.0–34.0)
MCHC: 35.2 g/dL (ref 30.0–36.0)
MCV: 88.4 fL (ref 78.0–100.0)
MONO ABS: 0.7 10*3/uL (ref 0.1–1.0)
MONOS PCT: 12 % (ref 3–12)
NEUTROS ABS: 3.2 10*3/uL (ref 1.7–7.7)
NEUTROS PCT: 60 % (ref 43–77)
Platelets: 147 10*3/uL — ABNORMAL LOW (ref 150–400)
RBC: 4.24 MIL/uL (ref 3.87–5.11)
RDW: 12.9 % (ref 11.5–15.5)
WBC: 5.4 10*3/uL (ref 4.0–10.5)

## 2015-05-03 LAB — URINALYSIS, ROUTINE W REFLEX MICROSCOPIC
Glucose, UA: 100 mg/dL — AB
Hgb urine dipstick: NEGATIVE
KETONES UR: 15 mg/dL — AB
NITRITE: NEGATIVE
Protein, ur: 100 mg/dL — AB
Specific Gravity, Urine: 1.021 (ref 1.005–1.030)
Urobilinogen, UA: 1 mg/dL (ref 0.0–1.0)
pH: 6 (ref 5.0–8.0)

## 2015-05-03 LAB — LIPASE, BLOOD

## 2015-05-03 LAB — URINE MICROSCOPIC-ADD ON

## 2015-05-03 LAB — I-STAT TROPONIN, ED: TROPONIN I, POC: 0.01 ng/mL (ref 0.00–0.08)

## 2015-05-03 MED ORDER — ONDANSETRON HCL 8 MG PO TABS: 8.0000 mg | ORAL_TABLET | Freq: Three times a day (TID) | ORAL | Status: DC | PRN

## 2015-05-03 MED ORDER — SODIUM CHLORIDE 0.9 % IV BOLUS (SEPSIS)
500.0000 mL | Freq: Once | INTRAVENOUS | Status: AC
  Administered 2015-05-03: 500 mL via INTRAVENOUS

## 2015-05-03 MED ORDER — POTASSIUM CHLORIDE CRYS ER 20 MEQ PO TBCR: 20.0000 meq | EXTENDED_RELEASE_TABLET | Freq: Every day | ORAL | Status: DC

## 2015-05-03 MED ORDER — POTASSIUM CHLORIDE CRYS ER 20 MEQ PO TBCR
40.0000 meq | EXTENDED_RELEASE_TABLET | Freq: Once | ORAL | Status: AC
  Administered 2015-05-03: 40 meq via ORAL
  Filled 2015-05-03: qty 2

## 2015-05-03 MED ORDER — SODIUM CHLORIDE 0.9 % IV SOLN
INTRAVENOUS | Status: DC
  Administered 2015-05-03: 500 mL via INTRAVENOUS

## 2015-05-03 MED ORDER — ONDANSETRON HCL 4 MG/2ML IJ SOLN
4.0000 mg | Freq: Once | INTRAMUSCULAR | Status: AC
  Administered 2015-05-03: 4 mg via INTRAVENOUS
  Filled 2015-05-03: qty 2

## 2015-05-03 NOTE — ED Provider Notes (Signed)
CSN: [id number]     Arrival date & time 05/03/15  [id number] History   First MD Initiated Contact with Patient 05/03/15 1013     Chief Complaint  Patient presents with  . Emesis  . Abdominal Pain     (Consider location/radiation/quality/duration/timing/severity/associated sxs/prior Treatment) HPI   Sara Graves is a 74 y.o. female is here for evaluation of recurrent emesis, improving with her usual home treatments. She also has upper abdominal pain that is crampy and intermittent. She had an appointment with her gastroenterologist. 2 days ago, but chose not to go because she was vomiting. The current episode has been present for 4 days. She denies hematemesis, fever, chills, cough, shortness of breath, chest pain, weakness, dizziness or problems walking. There are no other no modifying factors.   Past Medical History  Diagnosis Date  . H. pylori infection 2008 and 1998    treated  . Multinodular goiter   . Hx of colonoscopy with polypectomy   . Depression   . Hyperlipidemia   . Hypertension   . Arthritis   . Urge urinary incontinence   . Diabetes mellitus, type II, insulin dependent   . Rotator cuff tear, left recurrent   . Bursitis of left shoulder   . Hypothyroidism    Past Surgical History  Procedure Laterality Date  . 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 Lipoma excision      RIGHT ELBOW  . Excision ganglion cyst left ring finger  01-17-2009  . Implantation permanent spinal cord stimulator  06-15-2008    JUNE 2013--  BATTERY CHANGE  . Foraminal decompression at l2 to the sacrum  01-05-2008    L2  -  S1  . Rotator cuff repair  03-28-2003    RIGHT SHOULDER  DEGENERATIVE AC JOINT AND RC TEAR  . Bladder neck suspension  1970'S  . Bilateral bunionectomies    . Hammertoe repair      BILATERAL  . Vaginal hysterectomy  1970'S  . Appendectomy  AGE 73  . Tonsillectomy  AGE 46  . Shoulder open rotator cuff repair  09/07/2012    Procedure: ROTATOR CUFF  REPAIR SHOULDER OPEN;  Surgeon: Magnus Sinning, MD;  Location: [facility];  Service: Orthopedics;  Laterality: Left;  OPEN ANTERIOR ACROMIONECTOMY AND ROTATOR CUFF REPAIR ON LEFT   . Shoulder open rotator cuff repair Left 12/28/2012    Procedure: ROTATOR CUFF REPAIR SHOULDER OPEN;  Surgeon: Magnus Sinning, MD;  Location: [facility];  Service: Orthopedics;  Laterality: Left;  OPEN SHOULDER ROTATOR CUFF REPAIR ON LEFT  WITH ANTERIOR ACROMINECTOMY    Family History  Problem Relation Age of Onset  . Cancer Mother     breast  . Heart Problems Mother   . Muscular dystrophy Son   . Cancer Maternal Grandmother     breast  . Heart disease Maternal Grandfather   . Prostate cancer Father   . Breast cancer Sister    History  Substance Use Topics  . Smoking status: Never Smoker   . Smokeless tobacco: Never Used  . Alcohol Use: No   OB History    No data available     Review of Systems  All other systems reviewed and are negative.     Allergies  Rosiglitazone maleate; Elavil; Morphine; and Tramadol-acetaminophen  Home Medications   Prior to Admission medications   Medication Sig Start Date End Date Taking? Authorizing Provider  aspirin (BAYER CHILDRENS  ASPIRIN) 81 MG chewable tablet Chew 81 mg by mouth daily.    Yes Historical Provider, MD  atorvastatin (LIPITOR) 20 MG tablet Take 20 mg by mouth at bedtime.   Yes Historical Provider, MD  busPIRone (BUSPAR) 7.5 MG tablet Take 7.5 mg by mouth 2 (two) times daily.   Yes Historical Provider, MD  FLUoxetine (PROZAC) 20 MG capsule Take 20 mg by mouth daily.   Yes Historical Provider, MD  Insulin Glargine (TOUJEO SOLOSTAR) 300 UNIT/ML SOPN Inject 40 Units into the skin daily with breakfast.   Yes Historical Provider, MD  levothyroxine (SYNTHROID, LEVOTHROID) 50 MCG tablet Take 50 mcg by mouth every morning.  08/24/12  Yes Clovis Cao, MD  losartan (COZAAR) 100 MG tablet Take 100 mg by mouth daily.   Yes  Historical Provider, MD  metoCLOPramide (REGLAN) 5 MG tablet Take 1 tablet (5 mg total) by mouth 3 (three) times daily before meals. 03/26/15  Yes Thurnell Lose, MD  Omega-3 Fatty Acids (FISH OIL) 1000 MG CAPS Take 1,000 mg by mouth 2 (two) times daily. OTC fish oil   Yes Historical Provider, MD  ondansetron (ZOFRAN) 8 MG tablet Take 8 mg by mouth every 8 (eight) hours as needed for nausea or vomiting.   Yes Historical Provider, MD  pantoprazole (PROTONIX) 40 MG tablet Take 1 tablet (40 mg total) by mouth daily. Switch for any other PPI at similar dose and frequency 03/26/15  Yes Thurnell Lose, MD  promethazine (PHENERGAN) 25 MG suppository Place 1 suppository (25 mg total) rectally every 6 (six) hours as needed for nausea or vomiting. 03/26/15  Yes Thurnell Lose, MD  repaglinide (PRANDIN) 2 MG tablet Take 2 mg by mouth 3 (three) times daily before meals. 01/10/15 01/10/16 Yes Historical Provider, MD  insulin glargine (LANTUS) 100 UNIT/ML injection Inject 0.04 mLs (4 Units total) into the skin 2 (two) times daily. Patient not taking: Reported on 03/24/2015 09/02/14   Orson Eva, MD   BP 190/89 mmHg  Pulse 60  Temp(Src) 97.9 F (36.6 C) (Oral)  Resp 18  Ht 5\' 4"  (1.626 m)  Wt 150 lb (68.04 kg)  BMI 25.73 kg/m2  SpO2 100% Physical Exam  Constitutional: She is oriented to person, place, and time. She appears well-developed. No distress.  Elderly, frail  HENT:  Head: Normocephalic and atraumatic.  Right Ear: External ear normal.  Left Ear: External ear normal.  Eyes: Conjunctivae and EOM are normal. Pupils are equal, round, and reactive to light.  Neck: Normal range of motion and phonation normal. Neck supple.  Cardiovascular: Normal rate, regular rhythm and normal heart sounds.   Pulmonary/Chest: Effort normal and breath sounds normal. She exhibits no bony tenderness.  Abdominal: Soft. There is tenderness (Epigastric, mild).  Musculoskeletal: Normal range of motion.  Neurological: She is  alert and oriented to person, place, and time. No cranial nerve deficit or sensory deficit. She exhibits normal muscle tone. Coordination normal.  Skin: Skin is warm, dry and intact.  Psychiatric: She has a normal mood and affect. Her behavior is normal. Judgment and thought content normal.  Nursing note and vitals reviewed.   ED Course  Procedures (including critical care time)  Medications  0.9 %  sodium chloride infusion ( Intravenous Stopped 05/03/15 1510)  sodium chloride 0.9 % bolus 500 mL (0 mLs Intravenous Stopped 05/03/15 1319)  ondansetron (ZOFRAN) injection 4 mg (4 mg Intravenous Given 05/03/15 1033)  potassium chloride SA (K-DUR,KLOR-CON) CR tablet 40 mEq (40 mEq Oral Given  05/03/15 1318)    Patient Vitals for the past 24 hrs:  BP Temp Temp src Pulse Resp SpO2 Height Weight  05/03/15 1454 190/89 mmHg - - 60 18 100 % - -  05/03/15 1317 - - - 60 - - - -  05/03/15 1242 (!) 148/48 mmHg - - (!) 51 18 97 % - -  05/03/15 0942 134/57 mmHg 97.9 F (36.6 C) Oral 73 20 100 % 5\' 4"  (1.626 m) 150 lb (68.04 kg)    3:22 PM Reevaluation with update and discussion. After initial assessment and treatment, an updated evaluation reveals the patient feels better now. She is tolerating some oral liquids and has not had anymore vomiting. She states that she has been on potassium in the past. She also needs some antiemetics to use at home. [place] Review Labs Reviewed  CBC WITH DIFFERENTIAL/PLATELET - Abnormal; Notable for the following:    Platelets 147 (*)    All other components within normal limits  COMPREHENSIVE METABOLIC PANEL - Abnormal; Notable for the following:    Potassium 2.9 (*)    Chloride 95 (*)    Glucose, Bld 190 (*)    BUN 24 (*)    AST 14 (*)    ALT 11 (*)    Total Bilirubin 1.3 (*)    All other components within normal limits  LIPASE, BLOOD - Abnormal; Notable for the following:    Lipase <10 (*)    All other components within normal limits   URINALYSIS, ROUTINE W REFLEX MICROSCOPIC (NOT AT [facility]) - Abnormal; Notable for the following:    Color, Urine AMBER (*)    APPearance CLOUDY (*)    Glucose, UA 100 (*)    Bilirubin Urine MODERATE (*)    Ketones, ur 15 (*)    Protein, ur 100 (*)    Leukocytes, UA MODERATE (*)    All other components within normal limits  URINE MICROSCOPIC-ADD ON - Abnormal; Notable for the following:    Bacteria, UA FEW (*)    Casts HYALINE CASTS (*)    All other components within normal limits  URINE CULTURE  I-STAT TROPOININ, ED    Imaging Review No results found.   EKG Interpretation   Date/Time:  Thursday May 03 2015 09:52:34 EDT Ventricular Rate:  70 PR Interval:  136 QRS Duration: 98 QT Interval:  386 QTC Calculation: 416 R Axis:   44 Text Interpretation:  Sinus rhythm Abnormal T, consider ischemia, diffuse  leads Baseline wander in lead(s) V6 Since last tracing T wave abnormality  is new Confirmed by Eulis Foster  MD, Takuya Lariccia [id number]) on 05/03/2015 6:30:31 PM      MDM   Final diagnoses:  Non-intractable cyclical vomiting with nausea  Hypokalemia    Recurrent vomiting for several days. Mild hypokalemia, treated with oral meds. Doubt ACS, PE, PNE or impending vascular collapse.  Nursing Notes Reviewed/ Care Coordinated Applicable Imaging Reviewed Interpretation of Laboratory Data incorporated into ED treatment  The patient appears reasonably screened and/or stabilized for discharge and I doubt any other medical condition or other [facility] requiring further screening, evaluation, or treatment in the ED at this time prior to discharge.  Plan: Home Medications- Zofran, Potassium; Home Treatments- Rest, liquid diet and gradually advance; return here if the recommended treatment, does not improve the symptoms; Recommended follow up- PCP check up within 1 week.     Daleen Bo, MD 05/03/15 [phone]

## 2015-05-03 NOTE — ED Notes (Signed)
MD at bedside.EDP [id number] PRESENT TO EVALUATE THIS PT

## 2015-05-03 NOTE — Discharge Instructions (Signed)
Cyclic Vomiting Syndrome Cyclic vomiting syndrome is a benign condition in which patients experience bouts or cycles of severe nausea and vomiting that last for hours or even days. The bouts of nausea and vomiting alternate with longer periods of no symptoms and generally good health. Cyclic vomiting syndrome occurs mostly in children, but can affect adults. CAUSES  [facility] has no known cause. Each episode is typically similar to the previous ones. The episodes tend to:   Start at about the same time of day.  Last the same length of time.  Present the same symptoms at the same level of intensity. Cyclic vomiting syndrome can begin at any age in children and adults. Cyclic vomiting syndrome usually starts between the ages of 3 and 7 years. In adults, episodes tend to occur less often than they do in children, but they last longer. Furthermore, the events or situations that trigger episodes in adults cannot always be pinpointed as easily as they can in children. There are 4 phases of cyclic vomiting syndrome:  Prodrome. The prodrome phase signals that an episode of nausea and vomiting is about to begin. This phase can last from just a few minutes to several hours. This phase is often marked by belly (abdominal) pain. Sometimes taking medicine early in the prodrome phase can stop an episode in progress. However, sometimes there is no warning. A person may simply wake up in the middle of the night or early morning and begin vomiting.  Episode. The episode phase consists of:  Severe vomiting.  Nausea.  Gagging (retching).  Recovery. The recovery phase begins when the nausea and vomiting stop. Healthy color, appetite, and energy return.  Symptom-free interval. The symptom-free interval phase is the period between episodes when no symptoms are present. TRIGGERS Episodes can be triggered by an infection or event. Examples of triggers include:  Infections.  Colds, allergies, sinus problems, and the  flu.  Eating certain foods such as chocolate or cheese.  Foods with monosodium glutamate (MSG) or preservatives.  Fast foods.  Pre-packaged foods.  Foods with low nutritional value (junk foods).  Overeating.  Eating just before going to bed.  Hot weather.  Dehydration.  Not enough sleep or poor sleep quality.  Physical exhaustion.  Menstruation.  Motion sickness.  Emotional stress (school or home difficulties).  Excitement or stress. SYMPTOMS  The main symptoms of cyclic vomiting syndrome are:  Severe vomiting.  Nausea.  Gagging (retching). Episodes usually begin at night or the first thing in the morning. Episodes may include vomiting or retching up to 5 or 6 times an hour during the worst of the episode. Episodes usually last anywhere from 1 to 4 days. Episodes can last for up to 10 days. Other symptoms include:  Paleness.  Exhaustion.  Listlessness.  Abdominal pain.  Loose stools or diarrhea. Sometimes the nausea and vomiting are so severe that a person appears to be almost unconscious. Sensitivity to light, headache, fever, dizziness, may also accompany an episode. In addition, the vomiting may cause drooling and excessive thirst. Drinking water usually leads to more vomiting, though the water can dilute the acid in the vomit, making the episode a little less painful. Continuous vomiting can lead to dehydration, which means that the body has lost excessive water and salts. DIAGNOSIS  Cyclic vomiting syndrome is hard to diagnose because there are no clear tests to identify it. A caregiver must diagnose cyclic vomiting syndrome by looking at symptoms and medical history. A caregiver must exclude more common diseases  or disorders that can also cause nausea and vomiting. Also, diagnosis takes time because caregivers need to identify a pattern or cycle to the vomiting. TREATMENT  Cyclic vomiting syndrome cannot be cured. Treatment varies, but people with cyclic  vomiting syndrome should get plenty of rest and sleep and take medications that prevent, stop, or lessen the vomiting episodes and other symptoms. People whose episodes are frequent and long-lasting may be treated during the symptom-free intervals in an effort to prevent or ease future episodes. The symptom-free phase is a good time to eliminate anything known to trigger an episode. For example, if episodes are brought on by stress or excitement, this period is the time to find ways to reduce stress and stay calm. If sinus problems or allergies cause episodes, those conditions should be treated. The triggers listed above should be avoided or prevented. Because of the similarities between migraine and cyclic vomiting syndrome, caregivers treat some people with severe cyclic vomiting syndrome with drugs that are also used for migraine headaches. The drugs are designed to:  Prevent episodes.  Reduce their frequency.  Lessen their severity. HOME CARE INSTRUCTIONS Once a vomiting episode begins, treatment is supportive. It helps to stay in bed and sleep in a dark, quiet room. Severe nausea and vomiting may require hospitalization and intravenous (IV) fluids to prevent dehydration. Relaxing medications (sedatives) may help if the nausea continues. Sometimes, during the prodrome phase, it is possible to stop an episode from happening altogether. Only take over-the-counter or prescription medicines for pain, discomfort or fever as directed by your caregiver. Do not give aspirin to children. During the recovery phase, drinking water and replacing lost electrolytes (salts in the blood) are very important. Electrolytes are salts that the body needs to function well and stay healthy. Symptoms during the recovery phase can vary. Some people find that their appetites return to normal immediately, while others need to begin by drinking clear liquids and then move slowly to solid food. RELATED COMPLICATIONS The severe  vomiting that defines cyclic vomiting syndrome is a risk factor for several complications:  Dehydration--Vomiting causes the body to lose water quickly.  Electrolyte imbalance--Vomiting also causes the body to lose the important salts it needs to keep working properly.  Peptic esophagitis--The tube that connects the mouth to the stomach (esophagus) becomes injured from the stomach acid that comes up with the vomit.  Hematemesis--The esophagus becomes irritated and bleeds, so blood mixes with the vomit.  Mallory-Weiss tear--The lower end of the esophagus may tear open or the stomach may bruise from vomiting or retching.  Tooth decay--The acid in the vomit can hurt the teeth by corroding the tooth enamel. SEEK MEDICAL CARE IF: You have questions or problems. Document Released: 01/12/2002 Document Revised: 01/26/2012 Document Reviewed: 02/10/2011 [facility] Patient Information 2015 [place], [place]. This information is not intended to replace advice given to you by your health care provider. Make sure you discuss any questions you have with your health care provider.  Hypokalemia Hypokalemia means that the amount of potassium in the blood is lower than normal.Potassium is a chemical, called an electrolyte, that helps regulate the amount of fluid in the body. It also stimulates muscle contraction and helps nerves function properly.Most of the body's potassium is inside of cells, and only a very small amount is in the blood. Because the amount in the blood is so small, minor changes can be life-threatening. CAUSES  Antibiotics.  Diarrhea or vomiting.  Using laxatives too much, which can cause diarrhea.  Chronic kidney disease.  Water pills (diuretics).  Eating disorders (bulimia).  Low magnesium level.  Sweating a lot. SIGNS AND SYMPTOMS  Weakness.  Constipation.  Fatigue.  Muscle cramps.  Mental confusion.  Skipped heartbeats or irregular heartbeat  (palpitations).  Tingling or numbness. DIAGNOSIS  Your health care provider can diagnose hypokalemia with blood tests. In addition to checking your potassium level, your health care provider may also check other lab tests. TREATMENT Hypokalemia can be treated with potassium supplements taken by mouth or adjustments in your current medicines. If your potassium level is very low, you may need to get potassium through a vein (IV) and be monitored in the hospital. A diet high in potassium is also helpful. Foods high in potassium are:  Nuts, such as peanuts and pistachios.  Seeds, such as sunflower seeds and pumpkin seeds.  Peas, lentils, and lima beans.  Whole grain and bran cereals and breads.  Fresh fruit and vegetables, such as apricots, avocado, bananas, cantaloupe, kiwi, oranges, tomatoes, asparagus, and potatoes.  Orange and tomato juices.  Red meats.  Fruit yogurt. HOME CARE INSTRUCTIONS  Take all medicines as prescribed by your health care provider.  Maintain a healthy diet by including nutritious food, such as fruits, vegetables, nuts, whole grains, and lean meats.  If you are taking a laxative, be sure to follow the directions on the label. SEEK MEDICAL CARE IF:  Your weakness gets worse.  You feel your heart pounding or racing.  You are vomiting or having diarrhea.  You are diabetic and having trouble keeping your blood glucose in the normal range. SEEK IMMEDIATE MEDICAL CARE IF:  You have chest pain, shortness of breath, or dizziness.  You are vomiting or having diarrhea for more than 2 days.  You faint. MAKE SURE YOU:   Understand these instructions.  Will watch your condition.  Will get help right away if you are not doing well or get worse. Document Released: 11/03/2005 Document Revised: 08/24/2013 Document Reviewed: 05/06/2013 [facility] Patient Information 2015 [place], [place]. This information is not intended to replace advice given to you by your  health care provider. Make sure you discuss any questions you have with your health care provider.

## 2015-05-03 NOTE — ED Notes (Signed)
Patient states on late Saturday afternoon, she developed mid abd pain with vomiting. Denies fever, diarrhea, constipation, or urinary symptoms. Pt states she has had these symptoms before over the last 2 years intermittently.

## 2015-05-03 NOTE — ED Notes (Signed)
PT AWARE OF NEED FOR URINE SAMPLE. UNABLE TO GO AT THIS TIME.

## 2015-05-03 NOTE — ED Notes (Signed)
Bed: [id number] Expected date:  Expected time:  Means of arrival:  Comments: Triage 1

## 2015-05-03 NOTE — ED Notes (Signed)
MD at bedside. EDP WENTZ PRESENT 

## 2015-05-05 LAB — URINE CULTURE

## 2015-05-09 ENCOUNTER — Other Ambulatory Visit (HOSPITAL_COMMUNITY): Payer: Self-pay | Admitting: Physician Assistant

## 2015-05-09 DIAGNOSIS — R112 Nausea with vomiting, unspecified: Secondary | ICD-10-CM

## 2015-05-09 DIAGNOSIS — R1011 Right upper quadrant pain: Secondary | ICD-10-CM

## 2015-05-11 ENCOUNTER — Other Ambulatory Visit: Payer: Self-pay | Admitting: Gastroenterology

## 2015-05-15 ENCOUNTER — Ambulatory Visit
Admission: RE | Admit: 2015-05-15 | Discharge: 2015-05-15 | Disposition: A | Discharge status: Home / Self Care | Payer: Medicare Other | Source: Ambulatory Visit | Attending: Gastroenterology | Admitting: Gastroenterology

## 2015-05-15 ENCOUNTER — Other Ambulatory Visit: Payer: Self-pay | Admitting: Gastroenterology

## 2015-05-15 DIAGNOSIS — R112 Nausea with vomiting, unspecified: Secondary | ICD-10-CM

## 2015-05-25 ENCOUNTER — Ambulatory Visit (HOSPITAL_COMMUNITY)
Admission: RE | Admit: 2015-05-25 | Discharge: 2015-05-25 | Disposition: A | Discharge status: Home / Self Care | Payer: Medicare Other | Source: Ambulatory Visit | Attending: Physician Assistant | Admitting: Physician Assistant

## 2015-05-25 DIAGNOSIS — R1011 Right upper quadrant pain: Secondary | ICD-10-CM | POA: Insufficient documentation

## 2015-05-25 DIAGNOSIS — R112 Nausea with vomiting, unspecified: Secondary | ICD-10-CM | POA: Insufficient documentation

## 2015-05-25 MED ORDER — TECHNETIUM TC 99M MEBROFENIN IV KIT
5.2000 | PACK | Freq: Once | INTRAVENOUS | Status: AC | PRN
  Administered 2015-05-25: 5 via INTRAVENOUS

## 2015-07-18 ENCOUNTER — Other Ambulatory Visit: Payer: Self-pay | Admitting: Physician Assistant

## 2015-07-18 ENCOUNTER — Other Ambulatory Visit: Payer: Self-pay

## 2015-07-18 DIAGNOSIS — Z1231 Encounter for screening mammogram for malignant neoplasm of breast: Secondary | ICD-10-CM

## 2015-07-26 ENCOUNTER — Ambulatory Visit
Admission: RE | Admit: 2015-07-26 | Discharge: 2015-07-26 | Disposition: A | Discharge status: Home / Self Care | Payer: Medicare Other | Source: Ambulatory Visit

## 2015-07-26 DIAGNOSIS — Z1231 Encounter for screening mammogram for malignant neoplasm of breast: Secondary | ICD-10-CM

## 2015-08-28 ENCOUNTER — Telehealth: Payer: Self-pay | Admitting: *Deleted

## 2015-08-28 NOTE — Telephone Encounter (Addendum)
Pt states she has an appt with [name] 09/02/2014 for a blister near her left heel, this am the blister has redness running up her leg.  Pt requesting earlier appt.  Left message stating if our office was not able to get in touch with her to schedule an earlier appt to go to the ER or her primary doctor. Pt called again states her husband took her to her primary doctor, and they didn't know what it was, but is to follow up with her on Thursday, and is going to keep the 09/03/2015 appt with [name].

## 2015-09-03 ENCOUNTER — Ambulatory Visit (INDEPENDENT_AMBULATORY_CARE_PROVIDER_SITE_OTHER): Payer: Medicare Other

## 2015-09-03 ENCOUNTER — Ambulatory Visit (INDEPENDENT_AMBULATORY_CARE_PROVIDER_SITE_OTHER): Payer: Medicare Other | Admitting: Podiatry

## 2015-09-03 ENCOUNTER — Encounter: Payer: Self-pay | Admitting: Podiatry

## 2015-09-03 VITALS — BP 160/80 | HR 73 | Resp 16 | Ht 64.0 in | Wt 170.0 lb

## 2015-09-03 DIAGNOSIS — M79674 Pain in right toe(s): Secondary | ICD-10-CM

## 2015-09-03 DIAGNOSIS — L03119 Cellulitis of unspecified part of limb: Secondary | ICD-10-CM | POA: Diagnosis not present

## 2015-09-03 DIAGNOSIS — L02619 Cutaneous abscess of unspecified foot: Secondary | ICD-10-CM

## 2015-09-03 DIAGNOSIS — E1151 Type 2 diabetes mellitus with diabetic peripheral angiopathy without gangrene: Secondary | ICD-10-CM

## 2015-09-03 DIAGNOSIS — Q828 Other specified congenital malformations of skin: Secondary | ICD-10-CM | POA: Diagnosis not present

## 2015-09-03 NOTE — Progress Notes (Signed)
   Subjective:    [name], female    DOB: [birth year], 74 y.o.   MRN: [id number]  HPI Patient presents with a blister on their left foot, heel-medial side; x1 week; pt diabetic, neuropathy; sugar=67 this am; A1C=7.0. Pt is on an antibiotic to help with this. Pt is also soaking foot in epsom salt with some relief.   Pt also presents with toe pain in their right foot, 4th toe. Pt stated, "stumped toe on wheelchair and entire toe hurts"; bruised and swollen; x1 week.     Review of Systems  Genitourinary: Positive for frequency.  Neurological: Positive for dizziness.  All other systems reviewed and are negative.      Objective:   Physical Exam        Assessment & Plan:

## 2015-09-04 NOTE — Progress Notes (Signed)
Subjective:     [name], female   DOB: Mar 15, 1941, 74 y.o.   MRN: [id number]  HPI patient presents with a blister on the left heel that she ended up going to her medical doctor and been put on antibiotics and it still is blistered and she is worried because of her long-term diabetes and also on the right fourth toe it is discolored with a history of trauma and possible fracture   Review of Systems  All other systems reviewed and are negative.      Objective:   Physical Exam  Constitutional: She is oriented to person, place, and time.  Cardiovascular: Intact distal pulses.   Musculoskeletal: Normal range of motion.  Neurological: She is oriented to person, place, and time.  Skin: Skin is warm and dry.  Nursing note and vitals reviewed.  vascular status very mildly diminished with diminishment of sharp Dole vibratory noted bilateral. On the medial side of the left heel there is a approximate 3 cm x 3 cm blister that is present with mild erythema surrounding it but no active edema or drainage noted. Patient's right fourth toe is bruised and discolored and patient has good digital perfusion and is well oriented 3     Assessment:     Abscess of the left heel localized in nature with no indication of proximal spread and possible fracture fourth digit right    Plan:     H&P and x-rays reviewed with patient. I went ahead and did a block of the left foot and then using sterile instrumentation I opened up the abscess flushed and drained and applied sterile dressing. I then for the right reviewed x-rays and advised on wider-type shoes and gave strict instructions to finish the antibiotics and if any redness drainage or any proximal systemic signs of infection were to occur she is to contact us and go straight to the emergency room

## 2015-11-02 ENCOUNTER — Ambulatory Visit (INDEPENDENT_AMBULATORY_CARE_PROVIDER_SITE_OTHER): Payer: Medicare Other | Admitting: Family Medicine

## 2015-11-02 ENCOUNTER — Observation Stay (HOSPITAL_COMMUNITY)
Admission: EM | Admit: 2015-11-02 | Discharge: 2015-11-03 | Disposition: A | Discharge status: Home / Self Care | Payer: Medicare Other | Attending: Emergency Medicine | Admitting: Emergency Medicine

## 2015-11-02 ENCOUNTER — Encounter (HOSPITAL_COMMUNITY): Payer: Self-pay | Admitting: Emergency Medicine

## 2015-11-02 VITALS — BP 154/80 | HR 72 | Temp 97.4°F | Resp 16 | Ht 64.0 in | Wt 160.0 lb

## 2015-11-02 DIAGNOSIS — I10 Essential (primary) hypertension: Secondary | ICD-10-CM | POA: Insufficient documentation

## 2015-11-02 DIAGNOSIS — E119 Type 2 diabetes mellitus without complications: Secondary | ICD-10-CM | POA: Insufficient documentation

## 2015-11-02 DIAGNOSIS — E785 Hyperlipidemia, unspecified: Secondary | ICD-10-CM | POA: Insufficient documentation

## 2015-11-02 DIAGNOSIS — M009 Pyogenic arthritis, unspecified: Secondary | ICD-10-CM | POA: Insufficient documentation

## 2015-11-02 DIAGNOSIS — Z7982 Long term (current) use of aspirin: Secondary | ICD-10-CM | POA: Diagnosis not present

## 2015-11-02 DIAGNOSIS — R1110 Vomiting, unspecified: Secondary | ICD-10-CM

## 2015-11-02 DIAGNOSIS — E039 Hypothyroidism, unspecified: Secondary | ICD-10-CM | POA: Insufficient documentation

## 2015-11-02 DIAGNOSIS — Z794 Long term (current) use of insulin: Secondary | ICD-10-CM | POA: Diagnosis not present

## 2015-11-02 DIAGNOSIS — R1115 Cyclical vomiting syndrome unrelated to migraine: Secondary | ICD-10-CM

## 2015-11-02 DIAGNOSIS — Z79899 Other long term (current) drug therapy: Secondary | ICD-10-CM | POA: Diagnosis not present

## 2015-11-02 DIAGNOSIS — G43A1 Cyclical vomiting, intractable: Secondary | ICD-10-CM

## 2015-11-02 DIAGNOSIS — R112 Nausea with vomiting, unspecified: Principal | ICD-10-CM | POA: Insufficient documentation

## 2015-11-02 DIAGNOSIS — M1990 Unspecified osteoarthritis, unspecified site: Secondary | ICD-10-CM | POA: Diagnosis not present

## 2015-11-02 DIAGNOSIS — Z96651 Presence of right artificial knee joint: Secondary | ICD-10-CM | POA: Insufficient documentation

## 2015-11-02 DIAGNOSIS — F329 Major depressive disorder, single episode, unspecified: Secondary | ICD-10-CM | POA: Diagnosis not present

## 2015-11-02 LAB — HEPATIC FUNCTION PANEL
ALT: 16 U/L (ref 14–54)
AST: 22 U/L (ref 15–41)
Albumin: 4.3 g/dL (ref 3.5–5.0)
Alkaline Phosphatase: 82 U/L (ref 38–126)
Bilirubin, Direct: 0.3 mg/dL (ref 0.1–0.5)
Indirect Bilirubin: 1 mg/dL — ABNORMAL HIGH (ref 0.3–0.9)
Total Bilirubin: 1.3 mg/dL — ABNORMAL HIGH (ref 0.3–1.2)
Total Protein: 7.7 g/dL (ref 6.5–8.1)

## 2015-11-02 LAB — URINALYSIS, ROUTINE W REFLEX MICROSCOPIC
BILIRUBIN URINE: NEGATIVE
GLUCOSE, UA: 100 mg/dL — AB
KETONES UR: 15 mg/dL — AB
Leukocytes, UA: NEGATIVE
Nitrite: NEGATIVE
PROTEIN: 30 mg/dL — AB
Specific Gravity, Urine: 1.01 (ref 1.005–1.030)
pH: 7 (ref 5.0–8.0)

## 2015-11-02 LAB — BASIC METABOLIC PANEL WITH GFR
Anion gap: 15 (ref 5–15)
BUN: 25 mg/dL — ABNORMAL HIGH (ref 6–20)
CO2: 26 mmol/L (ref 22–32)
Calcium: 9.3 mg/dL (ref 8.9–10.3)
Chloride: 98 mmol/L — ABNORMAL LOW (ref 101–111)
Creatinine, Ser: 0.88 mg/dL (ref 0.44–1.00)
GFR calc Af Amer: >60 mL/min
GFR calc non Af Amer: >60 mL/min
Glucose, Bld: 187 mg/dL — ABNORMAL HIGH (ref 65–99)
Potassium: 2.9 mmol/L — ABNORMAL LOW (ref 3.5–5.1)
Sodium: 139 mmol/L (ref 135–145)

## 2015-11-02 LAB — CBC WITH DIFFERENTIAL/PLATELET
Basophils Absolute: 0 K/uL (ref 0.0–0.1)
Basophils Relative: 0 %
Eosinophils Absolute: 0 K/uL (ref 0.0–0.7)
Eosinophils Relative: 0 %
HCT: 36.2 % (ref 36.0–46.0)
Hemoglobin: 12.8 g/dL (ref 12.0–15.0)
Lymphocytes Relative: 14 %
Lymphs Abs: 0.8 K/uL (ref 0.7–4.0)
MCH: 31 pg (ref 26.0–34.0)
MCHC: 35.4 g/dL (ref 30.0–36.0)
MCV: 87.7 fL (ref 78.0–100.0)
Monocytes Absolute: 0.1 K/uL (ref 0.1–1.0)
Monocytes Relative: 2 %
Neutro Abs: 5 K/uL (ref 1.7–7.7)
Neutrophils Relative %: 84 %
Platelets: 143 K/uL — ABNORMAL LOW (ref 150–400)
RBC: 4.13 MIL/uL (ref 3.87–5.11)
RDW: 12.9 % (ref 11.5–15.5)
WBC: 5.9 K/uL (ref 4.0–10.5)

## 2015-11-02 LAB — URINE MICROSCOPIC-ADD ON: Squamous Epithelial / LPF: NONE SEEN

## 2015-11-02 LAB — LIPASE, BLOOD: Lipase: 18 U/L (ref 11–51)

## 2015-11-02 MED ORDER — SODIUM CHLORIDE 0.9 % IV BOLUS (SEPSIS)
1000.0000 mL | Freq: Once | INTRAVENOUS | Status: AC
  Administered 2015-11-02: 1000 mL via INTRAVENOUS

## 2015-11-02 MED ORDER — PANTOPRAZOLE SODIUM 40 MG IV SOLR
40.0000 mg | Freq: Once | INTRAVENOUS | Status: AC
  Administered 2015-11-02: 40 mg via INTRAVENOUS
  Filled 2015-11-02: qty 40

## 2015-11-02 MED ORDER — PROMETHAZINE HCL 25 MG/ML IJ SOLN
12.5000 mg | Freq: Once | INTRAMUSCULAR | Status: AC
  Administered 2015-11-02: 12.5 mg via INTRAVENOUS
  Filled 2015-11-02: qty 1

## 2015-11-02 MED ORDER — ONDANSETRON HCL 8 MG PO TABS: 8.0000 mg | ORAL_TABLET | Freq: Three times a day (TID) | ORAL | Status: DC | PRN

## 2015-11-02 MED ORDER — ONDANSETRON HCL 4 MG/2ML IJ SOLN
4.0000 mg | Freq: Once | INTRAMUSCULAR | Status: AC
  Administered 2015-11-02: 4 mg via INTRAVENOUS
  Filled 2015-11-02: qty 2

## 2015-11-02 MED ORDER — FENTANYL CITRATE (PF) 100 MCG/2ML IJ SOLN
50.0000 ug | Freq: Once | INTRAMUSCULAR | Status: AC
  Administered 2015-11-02: 50 ug via INTRAVENOUS
  Filled 2015-11-02: qty 2

## 2015-11-02 MED ORDER — POTASSIUM CHLORIDE CRYS ER 20 MEQ PO TBCR
40.0000 meq | EXTENDED_RELEASE_TABLET | Freq: Once | ORAL | Status: AC
  Administered 2015-11-02: 40 meq via ORAL
  Filled 2015-11-02: qty 2

## 2015-11-02 NOTE — ED Provider Notes (Signed)
CSN: [id number]     Arrival date & time 11/02/15  1934 History   First MD Initiated Contact with Patient 11/02/15 2012     Chief Complaint  Patient presents with  . Emesis     (Consider location/radiation/quality/duration/timing/severity/associated sxs/prior Treatment) HPI  Sara Graves is a 74 y.o F with a pmhx of cyclical N/V, HLD, HTN, DM who presents to the ED today c/o nausea and vomiting. Pt states that she began vomiting yesterday with green emesis. Patient took home Zofran and Phenergan suppository with mild relief. However today when she woke up she began uncontrollably vomiting and this time it is brown. Patient states she has mild upper abdominal pain but feels this is due to inflammation from all the vomiting. Patient was seen at urgent care for this today however the nausea and vomiting was so persistent that they sent her to the emergency department to be evaluated. Patient has extensive history of cyclical nausea and vomiting with multiple imaging studies and admissions for same. Patient states that one year ago she lost 120 pounds due to uncontrollable nausea and vomiting. Patient states that her doctors have not been able to find a cause for her vomiting. She has had multiple EGDs and CT imaging as well as gastric emptying studies and ultrasounds. Patient denies fever, chills, chest pain, shortness of breath, diarrhea, melena, hematochezia, hematemesis.  Past Medical History  Diagnosis Date  . H. pylori infection 2008 and 1998    treated  . Multinodular goiter   . Hx of colonoscopy with polypectomy   . Depression   . Hyperlipidemia   . Hypertension   . Arthritis   . Urge urinary incontinence   . Diabetes mellitus, type II, insulin dependent ([place])   . Rotator cuff tear, left recurrent   . Bursitis of left shoulder   . Hypothyroidism    Past Surgical History  Procedure Laterality Date  . 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 Lipoma excision     RIGHT ELBOW  . Excision ganglion cyst left ring finger  01-17-2009  . Implantation permanent spinal cord stimulator  06-15-2008    JUNE 2013--  BATTERY CHANGE  . Foraminal decompression at l2 to the sacrum  01-05-2008    L2  -  S1  . Rotator cuff repair  03-28-2003    RIGHT SHOULDER  DEGENERATIVE AC JOINT AND RC TEAR  . Bladder neck suspension  1970'S  . Bilateral bunionectomies    . Hammertoe repair      BILATERAL  . Vaginal hysterectomy  1970'S  . Appendectomy  AGE 4  . Tonsillectomy  AGE 84  . Shoulder open rotator cuff repair  09/07/2012    Procedure: ROTATOR CUFF REPAIR SHOULDER OPEN;  Surgeon: Magnus Sinning, MD;  Location: [facility];  Service: Orthopedics;  Laterality: Left;  OPEN ANTERIOR ACROMIONECTOMY AND ROTATOR CUFF REPAIR ON LEFT   . Shoulder open rotator cuff repair Left 12/28/2012    Procedure: ROTATOR CUFF REPAIR SHOULDER OPEN;  Surgeon: Magnus Sinning, MD;  Location: [facility];  Service: Orthopedics;  Laterality: Left;  OPEN SHOULDER ROTATOR CUFF REPAIR ON LEFT  WITH ANTERIOR ACROMINECTOMY    Family History  Problem Relation Age of Onset  . Cancer Mother     breast  . Heart Problems Mother   . Muscular dystrophy Son   . Cancer Maternal Grandmother     breast  . Heart disease Maternal Grandfather   .  Prostate cancer Father   . Breast cancer Sister    Social History  Substance Use Topics  . Smoking status: Never Smoker   . Smokeless tobacco: Never Used  . Alcohol Use: No   OB History    No data available     Review of Systems  All other systems reviewed and are negative.     Allergies  Rosiglitazone maleate; Elavil; Morphine; and Tramadol-acetaminophen  Home Medications   Prior to Admission medications   Medication Sig Start Date End Date Taking? Authorizing Provider  aspirin (BAYER CHILDRENS ASPIRIN) 81 MG chewable tablet Chew 81 mg by mouth daily.    Yes Historical Provider, MD  atorvastatin (LIPITOR) 20  MG tablet Take 20 mg by mouth at bedtime.   Yes Historical Provider, MD  busPIRone (BUSPAR) 7.5 MG tablet Take 7.5 mg by mouth 2 (two) times daily.   Yes Historical Provider, MD  FLUoxetine (PROZAC) 20 MG capsule Take 20 mg by mouth daily.   Yes Historical Provider, MD  furosemide (LASIX) 20 MG tablet TAKE ONE TABLET BY MOUTH IN THE MORNING AS NEEDED FOR SWELLING 10/30/15  Yes Historical Provider, MD  Insulin Glargine (TOUJEO SOLOSTAR) 300 UNIT/ML SOPN Inject 36 Units into the skin daily with breakfast.    Yes Historical Provider, MD  levothyroxine (SYNTHROID, LEVOTHROID) 50 MCG tablet Take 50 mcg by mouth every morning.  08/24/12  Yes Clovis Cao, MD  losartan (COZAAR) 100 MG tablet Take 100 mg by mouth daily.   Yes Historical Provider, MD  Omega-3 Fatty Acids (FISH OIL) 1000 MG CAPS Take 1,000 mg by mouth 2 (two) times daily. OTC fish oil   Yes Historical Provider, MD  repaglinide (PRANDIN) 2 MG tablet Take 2 mg by mouth 3 (three) times daily before meals. 01/10/15 01/10/16 Yes Historical Provider, MD  insulin glargine (LANTUS) 100 UNIT/ML injection Inject 0.04 mLs (4 Units total) into the skin 2 (two) times daily. Patient not taking: Reported on 11/02/2015 09/02/14   Orson Eva, MD  metoCLOPramide (REGLAN) 5 MG tablet Take 1 tablet (5 mg total) by mouth 3 (three) times daily before meals. Patient not taking: Reported on 11/02/2015 03/26/15   Thurnell Lose, MD  omeprazole (PRILOSEC) 40 MG capsule Take 40 mg by mouth daily.  10/31/15   Historical Provider, MD  ondansetron (ZOFRAN) 8 MG tablet Take 1 tablet (8 mg total) by mouth every 8 (eight) hours as needed for nausea or vomiting. Patient not taking: Reported on 11/02/2015 05/03/15   Daleen Bo, MD  pantoprazole (PROTONIX) 40 MG tablet Take 1 tablet (40 mg total) by mouth daily. Switch for any other PPI at similar dose and frequency Patient taking differently: Take 40 mg by mouth daily as needed (heartburn and indigestion).  03/26/15   Thurnell Lose, MD  potassium chloride SA (K-DUR,KLOR-CON) 20 MEQ tablet Take 1 tablet (20 mEq total) by mouth daily. Patient not taking: Reported on 11/02/2015 05/03/15   Daleen Bo, MD  promethazine (PHENERGAN) 25 MG suppository Place 1 suppository (25 mg total) rectally every 6 (six) hours as needed for nausea or vomiting. Patient not taking: Reported on 11/02/2015 03/26/15   Thurnell Lose, MD   BP 166/60 mmHg  Pulse 76  Temp(Src) 97.5 F (36.4 C) (Oral)  Resp 18  SpO2 96% Physical Exam  Constitutional: She is oriented to person, place, and time. She appears well-developed and well-nourished. She appears distressed.  Patient actively vomiting.  HENT:  Head: Normocephalic and atraumatic.  Mouth/Throat: No  oropharyngeal exudate.  Eyes: Conjunctivae and EOM are normal. Pupils are equal, round, and reactive to light. Right eye exhibits no discharge. Left eye exhibits no discharge. No scleral icterus.  Cardiovascular: Normal rate, regular rhythm, normal heart sounds and intact distal pulses.  Exam reveals no gallop and no friction rub.   No murmur heard. Pulmonary/Chest: Effort normal and breath sounds normal. No respiratory distress. She has no wheezes. She has no rales. She exhibits no tenderness.  Abdominal: Soft. She exhibits no distension. There is tenderness ( Mild epigastric tenderness to palpation.). There is no guarding.  No peritoneal signs. Negative Murphy sign. No rigidity.  Musculoskeletal: Normal range of motion. She exhibits no edema.  Neurological: She is alert and oriented to person, place, and time.  Skin: Skin is warm and dry. No rash noted. She is not diaphoretic. No erythema. No pallor.  Psychiatric: She has a normal mood and affect. Her behavior is normal.  Nursing note and vitals reviewed.   ED Course  Procedures (including critical care time) Labs Review Labs Reviewed  BASIC METABOLIC PANEL - Abnormal; Notable for the following:    Potassium 2.9 (*)    Chloride 98  (*)    Glucose, Bld 187 (*)    BUN 25 (*)    All other components within normal limits  CBC WITH DIFFERENTIAL/PLATELET - Abnormal; Notable for the following:    Platelets 143 (*)    All other components within normal limits  URINALYSIS, ROUTINE W REFLEX MICROSCOPIC (NOT AT [facility]) - Abnormal; Notable for the following:    Glucose, UA 100 (*)    Hgb urine dipstick TRACE (*)    Ketones, ur 15 (*)    Protein, ur 30 (*)    All other components within normal limits  HEPATIC FUNCTION PANEL - Abnormal; Notable for the following:    Total Bilirubin 1.3 (*)    Indirect Bilirubin 1.0 (*)    All other components within normal limits  URINE MICROSCOPIC-ADD ON - Abnormal; Notable for the following:    Bacteria, UA RARE (*)    All other components within normal limits  LIPASE, BLOOD    Imaging Review No results found. I have personally reviewed and evaluated these images and lab results as part of my medical decision-making.   EKG Interpretation None      MDM   Final diagnoses:  Intractable vomiting with nausea, vomiting of unspecified type    74 year old female presenting with intractable nausea and vomiting. Patient with extensive history of same with large workups performed. No etiology found. Patient actively vomiting in ED. Patient given fluids, Protonix, Zofran and pain medication. Nausea is persisting. Patient given Phenergan. This seemed to help patient's nausea. After the Phenergan was given patient did not vomit in the emergency department again. Potassium was low at 2.9. Gave patient oral potassium which she tolerated well, did not vomit this up. All other lab work is within normal limits.   Patient appears much better after intervention. She is able to eat and drink while in the emergency department. Tolerating by mouth well at this point. No advanced imaging indicated at this time as patient has had multiple workups done for same symptoms. As patient has clinically improved and  lab work is within normal limits, will discharge patient home with appropriate follow-up. No concern for acute abdomen, SBO or GI bleed. Hemoglobin stable. Discussed treatment plan with patient who is agreeable. Return precautions outlined in patient discharge instructions.    [place],  PA-C 11/03/15 [place], MD 11/04/15 0000

## 2015-11-02 NOTE — ED Notes (Signed)
Pt's husband, Delfino Lovett, was called and was notified of pt's discharge--- he's coming to pick up pt.

## 2015-11-02 NOTE — Discharge Instructions (Signed)

## 2015-11-02 NOTE — ED Notes (Signed)
Bed: [facility] Expected date:  Expected time:  Means of arrival:  Comments: EMS 83F N/V

## 2015-11-02 NOTE — ED Provider Notes (Signed)
  Face-to-face evaluation   History: She presents for evaluation of persistant vomiting for 2 days despite taking Zofran.  Physical exam: Alert, elderly female in mild discomfort. Heart regular rate and rhythm, no murmur. Lungs clear. Abdomen mild diffuse tenderness. No rebound tenderness.  Medical screening examination/treatment/procedure(s) were conducted as a shared visit with non-physician practitioner(s) and myself.  I personally evaluated the patient during the encounter  Daleen Bo, MD 11/04/15 0000

## 2015-11-02 NOTE — ED Notes (Signed)
Pt called to triage, 1st attempt. No answer.

## 2015-11-02 NOTE — ED Notes (Signed)
Brought in by EMS from Urgent Care with c/o emesis.  Pt reports that she has been vomiting multiple times today---- went to Urgent Care for evaluation and treatment but emesis has become so persistent that she was sent here for emergent care; pt reports that she vomited 5x while at the urgent care.  Pt was given Zofran 4 mg IV en route to ED.  Arrived to ED A/Ox4, stated that nausea "subsided a little bit".  Pt admits to abdominal pain "because of vomiting so much".

## 2015-11-02 NOTE — Progress Notes (Signed)
[facility] [address], [place] [address] [phone]  Date:  11/02/2015   Name:  Sara Graves   DOB:  May 20, 1941   MRN:  [id number]  PCP:  [name], PA-C    Chief Complaint: Emesis   History of Present Illness:  Sara Graves is a 74 y.o. very pleasant female patient who presents with the following:  Here today with illness- new patient.  History of poorly controlled DM.   She has a history of cyclical vomiting and was admitted from 5/7 to 5/9. And then was in the ER for hte same issue in June.   She states that she has had the vomiting for about 18 months and w/u has not revealed a definite cause.   Her sx came on again 2 days ago. Today is Friday. She "threw up all night Wednesday night, then for a couple of house yesterday and again today."  She is not having diarrhea She is is not able to keep anything down today.  Still actively vomiting.   Her stomach is sore.   She has not been taking anything for her vomiting-  She ran out of her phenergan suppositories  She and her husband care for their son who has muscular dystrophy.  He is in the waiting room with them today   Lab Results  Component Value Date   HGBA1C 9.0* 08/29/2014     Patient Active Problem List   Diagnosis Date Noted  . Abdominal pain   . Vomiting 03/24/2015  . Essential hypertension 03/24/2015  . Thrombocytopenia ([place]) 08/31/2014  . Hypokalemia 08/30/2014  . Intractable nausea and vomiting 08/29/2014  . Protein-calorie malnutrition, severe ([place]) 05/16/2014  . Hyperkalemia 05/15/2014  . Unspecified vitamin D deficiency 05/09/2014  . Nausea alone 05/08/2014  . Osteopenia 04/06/2014  . Neuropathy of leg 04/27/2013  . Weight loss, abnormal 03/16/2013  . Left rotator cuff tear 12/29/2012  . Baker's cyst 04/09/2012  . Diabetic neuropathy ([place]) 09/16/2011  . UNSPECIFIED VENOUS INSUFFICIENCY 09/20/2010  . LACTOSE INTOLERANCE 01/23/2009  . Depression 02/04/2007   . Hypothyroidism 01/14/2007  . DM (diabetes mellitus), type 2, uncontrolled ([place]) 01/14/2007  . HYPERCHOLESTEROLEMIA 01/14/2007  . HYPERTENSION, BENIGN SYSTEMIC 01/14/2007  . RHINITIS, ALLERGIC 01/14/2007  . ARTHRITIS 01/14/2007  . SCIATICA 01/14/2007    Past Medical History  Diagnosis Date  . H. pylori infection 2008 and 1998    treated  . Multinodular goiter   . Hx of colonoscopy with polypectomy   . Depression   . Hyperlipidemia   . Hypertension   . Arthritis   . Urge urinary incontinence   . Diabetes mellitus, type II, insulin dependent ([place])   . Rotator cuff tear, left recurrent   . Bursitis of left shoulder   . Hypothyroidism     Past Surgical History  Procedure Laterality Date  . 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 Lipoma excision      RIGHT ELBOW  . Excision ganglion cyst left ring finger  01-17-2009  . Implantation permanent spinal cord stimulator  06-15-2008    JUNE 2013--  BATTERY CHANGE  . Foraminal decompression at l2 to the sacrum  01-05-2008    L2  -  S1  . Rotator cuff repair  03-28-2003    RIGHT SHOULDER  DEGENERATIVE AC JOINT AND RC TEAR  . Bladder neck suspension  1970'S  . Bilateral bunionectomies    . Hammertoe repair  BILATERAL  . Vaginal hysterectomy  1970'S  . Appendectomy  AGE 41  . Tonsillectomy  AGE 27  . Shoulder open rotator cuff repair  09/07/2012    Procedure: ROTATOR CUFF REPAIR SHOULDER OPEN;  Surgeon: Magnus Sinning, MD;  Location: [place];  Service: Orthopedics;  Laterality: Left;  OPEN ANTERIOR ACROMIONECTOMY AND ROTATOR CUFF REPAIR ON LEFT   . Shoulder open rotator cuff repair Left 12/28/2012    Procedure: ROTATOR CUFF REPAIR SHOULDER OPEN;  Surgeon: Magnus Sinning, MD;  Location: [facility];  Service: Orthopedics;  Laterality: Left;  OPEN SHOULDER ROTATOR CUFF REPAIR ON LEFT  WITH ANTERIOR ACROMINECTOMY     Social History  Substance Use Topics  . Smoking  status: Never Smoker   . Smokeless tobacco: Never Used  . Alcohol Use: No    Family History  Problem Relation Age of Onset  . Cancer Mother     breast  . Heart Problems Mother   . Muscular dystrophy Son   . Cancer Maternal Grandmother     breast  . Heart disease Maternal Grandfather   . Prostate cancer Father   . Breast cancer Sister     Allergies  Allergen Reactions  . Rosiglitazone Maleate Anaphylaxis and Swelling      (ANTIDIABETIC MED.) All over body   . Elavil [Amitriptyline] Nausea Only  . Morphine Other (See Comments)    SEVERE HYPOTENSION   . Tramadol-Acetaminophen Rash    Medication list has been reviewed and updated.  Current Outpatient Prescriptions on File Prior to Visit  Medication Sig Dispense Refill  . aspirin (BAYER CHILDRENS ASPIRIN) 81 MG chewable tablet Chew 81 mg by mouth daily.     Marland Kitchen atorvastatin (LIPITOR) 20 MG tablet Take 20 mg by mouth at bedtime.    . busPIRone (BUSPAR) 7.5 MG tablet Take 7.5 mg by mouth 2 (two) times daily.    Marland Kitchen FLUoxetine (PROZAC) 20 MG capsule Take 20 mg by mouth daily.    . Insulin Glargine (TOUJEO SOLOSTAR) 300 UNIT/ML SOPN Inject 40 Units into the skin daily with breakfast.    . levothyroxine (SYNTHROID, LEVOTHROID) 50 MCG tablet Take 50 mcg by mouth every morning.     Marland Kitchen losartan (COZAAR) 100 MG tablet Take 100 mg by mouth daily.    . metoCLOPramide (REGLAN) 5 MG tablet Take 1 tablet (5 mg total) by mouth 3 (three) times daily before meals. 90 tablet 0  . Omega-3 Fatty Acids (FISH OIL) 1000 MG CAPS Take 1,000 mg by mouth 2 (two) times daily. OTC fish oil    . ondansetron (ZOFRAN) 8 MG tablet Take 1 tablet (8 mg total) by mouth every 8 (eight) hours as needed for nausea or vomiting. 20 tablet 0  . pantoprazole (PROTONIX) 40 MG tablet Take 1 tablet (40 mg total) by mouth daily. Switch for any other PPI at similar dose and frequency 30 tablet 3  . potassium chloride SA (K-DUR,KLOR-CON) 20 MEQ tablet Take 1 tablet (20 mEq total)  by mouth daily. 10 tablet 0  . promethazine (PHENERGAN) 25 MG suppository Place 1 suppository (25 mg total) rectally every 6 (six) hours as needed for nausea or vomiting. 12 each 0  . repaglinide (PRANDIN) 2 MG tablet Take 2 mg by mouth 3 (three) times daily before meals.    . insulin glargine (LANTUS) 100 UNIT/ML injection Inject 0.04 mLs (4 Units total) into the skin 2 (two) times daily. (Patient not taking: Reported on 11/02/2015) 10 mL 11  No current facility-administered medications on file prior to visit.    Review of Systems:  As per HPI- otherwise negative.   Physical Examination: Filed Vitals:   11/02/15 1817  BP: 154/80  Pulse: 72  Temp: 97.4 F (36.3 C)  Resp: 16   Filed Vitals:   11/02/15 1817  Height: 5\' 4"  (1.626 m)  Weight: 160 lb (72.576 kg)   Body mass index is 27.45 kg/(m^2). Ideal Body Weight: Weight in (lb) to have BMI = 25: 145.3  GEN: WDWN, NAD, Non-toxic, A & O x 3, does not appear to feel well, vomiting up dark brown material  HEENT: Atraumatic, Normocephalic. Neck supple. No masses, No LAD. Ears and Nose: No external deformity. CV: RRR, No M/G/R. No JVD. No thrill. No extra heart sounds. PULM: CTA B, no wheezes, crackles, rhonchi. No retractions. No resp. distress. No accessory muscle use. ABD: S, ND, +BS. No rebound. No HSM.  Epigastric TTP EXTR: No c/c/e NEURO Normal gait.  PSYCH: Normally interactive. Conversant. Not depressed or anxious appearing.  Calm demeanor.    Assessment and Plan: Intractable cyclical vomiting with nausea  Here today with persistent vomiting- she has been vomiting for a full day and looks awful.  She is having epigastric discomfort.  I am concerned about how this elderly couple who also cares for a disabled son is going to do at home.  We are not able to get stat labs here today.  Called EMS to transport her to the hospital for further care  Signed Lamar Blinks, MD

## 2015-11-02 NOTE — ED Notes (Signed)
Pt able to tolerate ginger ale and ice water; pt denies nausea at this time.

## 2015-11-07 ENCOUNTER — Encounter (HOSPITAL_COMMUNITY): Payer: Self-pay | Admitting: *Deleted

## 2015-11-07 ENCOUNTER — Emergency Department (HOSPITAL_COMMUNITY)
Admission: EM | Admit: 2015-11-07 | Discharge: 2015-11-07 | Disposition: A | Discharge status: Home / Self Care | Payer: Medicare Other | Attending: Emergency Medicine | Admitting: Emergency Medicine

## 2015-11-07 DIAGNOSIS — R112 Nausea with vomiting, unspecified: Secondary | ICD-10-CM

## 2015-11-07 DIAGNOSIS — E860 Dehydration: Secondary | ICD-10-CM | POA: Insufficient documentation

## 2015-11-07 DIAGNOSIS — E876 Hypokalemia: Secondary | ICD-10-CM | POA: Diagnosis not present

## 2015-11-07 DIAGNOSIS — Z79899 Other long term (current) drug therapy: Secondary | ICD-10-CM | POA: Insufficient documentation

## 2015-11-07 DIAGNOSIS — M1990 Unspecified osteoarthritis, unspecified site: Secondary | ICD-10-CM | POA: Diagnosis not present

## 2015-11-07 DIAGNOSIS — E039 Hypothyroidism, unspecified: Secondary | ICD-10-CM | POA: Insufficient documentation

## 2015-11-07 DIAGNOSIS — E119 Type 2 diabetes mellitus without complications: Secondary | ICD-10-CM | POA: Insufficient documentation

## 2015-11-07 DIAGNOSIS — E785 Hyperlipidemia, unspecified: Secondary | ICD-10-CM | POA: Diagnosis not present

## 2015-11-07 DIAGNOSIS — Z7982 Long term (current) use of aspirin: Secondary | ICD-10-CM | POA: Insufficient documentation

## 2015-11-07 DIAGNOSIS — Z794 Long term (current) use of insulin: Secondary | ICD-10-CM | POA: Diagnosis not present

## 2015-11-07 DIAGNOSIS — F329 Major depressive disorder, single episode, unspecified: Secondary | ICD-10-CM | POA: Diagnosis not present

## 2015-11-07 DIAGNOSIS — Z8619 Personal history of other infectious and parasitic diseases: Secondary | ICD-10-CM | POA: Diagnosis not present

## 2015-11-07 LAB — CBC
HCT: 38.3 % (ref 36.0–46.0)
Hemoglobin: 13.4 g/dL (ref 12.0–15.0)
MCH: 31.2 pg (ref 26.0–34.0)
MCHC: 35 g/dL (ref 30.0–36.0)
MCV: 89.3 fL (ref 78.0–100.0)
PLATELETS: 150 10*3/uL (ref 150–400)
RBC: 4.29 MIL/uL (ref 3.87–5.11)
RDW: 13.1 % (ref 11.5–15.5)
WBC: 6.7 10*3/uL (ref 4.0–10.5)

## 2015-11-07 LAB — URINE MICROSCOPIC-ADD ON: Bacteria, UA: NONE SEEN

## 2015-11-07 LAB — COMPREHENSIVE METABOLIC PANEL
ALBUMIN: 4.4 g/dL (ref 3.5–5.0)
ALK PHOS: 84 U/L (ref 38–126)
ALT: 13 U/L — AB (ref 14–54)
AST: 16 U/L (ref 15–41)
Anion gap: 15 (ref 5–15)
BUN: 26 mg/dL — AB (ref 6–20)
CALCIUM: 9.2 mg/dL (ref 8.9–10.3)
CO2: 24 mmol/L (ref 22–32)
CREATININE: 1.3 mg/dL — AB (ref 0.44–1.00)
Chloride: 99 mmol/L — ABNORMAL LOW (ref 101–111)
GFR calc Af Amer: 46 mL/min — ABNORMAL LOW (ref >60)
GFR calc non Af Amer: 39 mL/min — ABNORMAL LOW (ref >60)
GLUCOSE: 292 mg/dL — AB (ref 65–99)
Potassium: 2.9 mmol/L — ABNORMAL LOW (ref 3.5–5.1)
SODIUM: 138 mmol/L (ref 135–145)
Total Bilirubin: 1.4 mg/dL — ABNORMAL HIGH (ref 0.3–1.2)
Total Protein: 7.6 g/dL (ref 6.5–8.1)

## 2015-11-07 LAB — URINALYSIS, ROUTINE W REFLEX MICROSCOPIC
BILIRUBIN URINE: NEGATIVE
Glucose, UA: >1000 mg/dL — AB
Ketones, ur: 15 mg/dL — AB
Leukocytes, UA: NEGATIVE
Nitrite: NEGATIVE
PROTEIN: 30 mg/dL — AB
SPECIFIC GRAVITY, URINE: 1.013 (ref 1.005–1.030)
pH: 6 (ref 5.0–8.0)

## 2015-11-07 LAB — LIPASE, BLOOD: Lipase: 19 U/L (ref 11–51)

## 2015-11-07 MED ORDER — METOCLOPRAMIDE HCL 5 MG PO TABS: 5.0000 mg | ORAL_TABLET | Freq: Three times a day (TID) | ORAL | Status: DC

## 2015-11-07 MED ORDER — ONDANSETRON 4 MG PO TBDP
4.0000 mg | ORAL_TABLET | Freq: Once | ORAL | Status: AC | PRN
  Administered 2015-11-07: 4 mg via ORAL
  Filled 2015-11-07: qty 1

## 2015-11-07 MED ORDER — SODIUM CHLORIDE 0.9 % IV BOLUS (SEPSIS)
1500.0000 mL | Freq: Once | INTRAVENOUS | Status: AC
Start: 2015-11-07 — End: 2015-11-07
  Administered 2015-11-07: 1500 mL via INTRAVENOUS

## 2015-11-07 MED ORDER — METOCLOPRAMIDE HCL 5 MG/ML IJ SOLN
10.0000 mg | Freq: Once | INTRAMUSCULAR | Status: AC
  Administered 2015-11-07: 10 mg via INTRAVENOUS
  Filled 2015-11-07: qty 2

## 2015-11-07 MED ORDER — ONDANSETRON 8 MG PO TBDP: 8.0000 mg | ORAL_TABLET | Freq: Three times a day (TID) | ORAL | Status: DC | PRN

## 2015-11-07 MED ORDER — POTASSIUM CHLORIDE CRYS ER 20 MEQ PO TBCR: 20.0000 meq | EXTENDED_RELEASE_TABLET | Freq: Every day | ORAL | Status: DC

## 2015-11-07 NOTE — ED Notes (Addendum)
Pt reports intermittent vomiting x2 years. Pt was seen in ED on 12/16 dx with intractable vomiting. Pt went to [facility] ED on Monday 12/19. Pt called GI doctor today, was told to come to ED. Pt reports vomited x12 today. Denies pain. Pt wears nausea patch behind left ear at present.   Hx of HTN, did not take medications today.

## 2015-11-07 NOTE — ED Provider Notes (Signed)
CSN: [id number]     Arrival date & time 11/07/15  1337 History   First MD Initiated Contact with Patient 11/07/15 1559     Chief Complaint  Patient presents with  . Emesis     HPI Patient presents to the emergency department complaining of nausea and vomiting over the past several days.  She's had intermittent nausea and vomiting over the past 2 years.  She does follow with gastroenterology.  No clear etiology for her nausea/vomiting has been found.  She was seen at another emergency department as well as a hospital ER in the [facility] over the past week without symptoms found.  She's been hydrated during those visits.  She denies fevers chills.  No hematemesis.  No melena or hematochezia   Past Medical History  Diagnosis Date  . H. pylori infection 2008 and 1998    treated  . Multinodular goiter   . Hx of colonoscopy with polypectomy   . Depression   . Hyperlipidemia   . Hypertension   . Arthritis   . Urge urinary incontinence   . Diabetes mellitus, type II, insulin dependent ([place])   . Rotator cuff tear, left recurrent   . Bursitis of left shoulder   . Hypothyroidism    Past Surgical History  Procedure Laterality Date  . 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 Lipoma excision      RIGHT ELBOW  . Excision ganglion cyst left ring finger  01-17-2009  . Implantation permanent spinal cord stimulator  06-15-2008    JUNE 2013--  BATTERY CHANGE  . Foraminal decompression at l2 to the sacrum  01-05-2008    L2  -  S1  . Rotator cuff repair  03-28-2003    RIGHT SHOULDER  DEGENERATIVE AC JOINT AND RC TEAR  . Bladder neck suspension  1970'S  . Bilateral bunionectomies    . Hammertoe repair      BILATERAL  . Vaginal hysterectomy  1970'S  . Appendectomy  AGE 50  . Tonsillectomy  AGE 87  . Shoulder open rotator cuff repair  09/07/2012    Procedure: ROTATOR CUFF REPAIR SHOULDER OPEN;  Surgeon: Magnus Sinning, MD;  Location: [facility];   Service: Orthopedics;  Laterality: Left;  OPEN ANTERIOR ACROMIONECTOMY AND ROTATOR CUFF REPAIR ON LEFT   . Shoulder open rotator cuff repair Left 12/28/2012    Procedure: ROTATOR CUFF REPAIR SHOULDER OPEN;  Surgeon: Magnus Sinning, MD;  Location: [facility];  Service: Orthopedics;  Laterality: Left;  OPEN SHOULDER ROTATOR CUFF REPAIR ON LEFT  WITH ANTERIOR ACROMINECTOMY    Family History  Problem Relation Age of Onset  . Cancer Mother     breast  . Heart Problems Mother   . Muscular dystrophy Son   . Cancer Maternal Grandmother     breast  . Heart disease Maternal Grandfather   . Prostate cancer Father   . Breast cancer Sister    Social History  Substance Use Topics  . Smoking status: Never Smoker   . Smokeless tobacco: Never Used  . Alcohol Use: No   OB History    No data available     Review of Systems  All other systems reviewed and are negative.     Allergies  Rosiglitazone maleate; Elavil; Morphine; and Tramadol-acetaminophen  Home Medications   Prior to Admission medications   Medication Sig Start Date End Date Taking? Authorizing Provider  Insulin Glargine (TOUJEO SOLOSTAR)  300 UNIT/ML SOPN Inject 36 Units into the skin daily with breakfast.    Yes Historical Provider, MD  ondansetron (ZOFRAN) 8 MG tablet Take 1 tablet (8 mg total) by mouth every 8 (eight) hours as needed for nausea or vomiting. 11/02/15  Yes Samantha Tripp Dowless, PA-C  aspirin (BAYER CHILDRENS ASPIRIN) 81 MG chewable tablet Chew 81 mg by mouth daily.     Historical Provider, MD  atorvastatin (LIPITOR) 20 MG tablet Take 20 mg by mouth at bedtime.    Historical Provider, MD  busPIRone (BUSPAR) 7.5 MG tablet Take 7.5 mg by mouth 2 (two) times daily.    Historical Provider, MD  FLUoxetine (PROZAC) 20 MG capsule Take 20 mg by mouth daily.    Historical Provider, MD  furosemide (LASIX) 20 MG tablet TAKE ONE TABLET BY MOUTH IN THE MORNING AS NEEDED FOR SWELLING 10/30/15   Historical  Provider, MD  insulin glargine (LANTUS) 100 UNIT/ML injection Inject 0.04 mLs (4 Units total) into the skin 2 (two) times daily. Patient not taking: Reported on 11/02/2015 09/02/14   Orson Eva, MD  levothyroxine (SYNTHROID, LEVOTHROID) 50 MCG tablet Take 50 mcg by mouth every morning.  08/24/12   Clovis Cao, MD  losartan (COZAAR) 100 MG tablet Take 100 mg by mouth daily.    Historical Provider, MD  metoCLOPramide (REGLAN) 5 MG tablet Take 1 tablet (5 mg total) by mouth 3 (three) times daily before meals. 11/07/15   Jola Schmidt, MD  Omega-3 Fatty Acids (FISH OIL) 1000 MG CAPS Take 1,000 mg by mouth 2 (two) times daily. OTC fish oil    Historical Provider, MD  omeprazole (PRILOSEC) 40 MG capsule Take 40 mg by mouth daily.  10/31/15   Historical Provider, MD  ondansetron (ZOFRAN ODT) 8 MG disintegrating tablet Take 1 tablet (8 mg total) by mouth every 8 (eight) hours as needed for nausea or vomiting. 11/07/15   Jola Schmidt, MD  pantoprazole (PROTONIX) 40 MG tablet Take 1 tablet (40 mg total) by mouth daily. Switch for any other PPI at similar dose and frequency Patient taking differently: Take 40 mg by mouth daily as needed (heartburn and indigestion).  03/26/15   Thurnell Lose, MD  potassium chloride SA (K-DUR,KLOR-CON) 20 MEQ tablet Take 1 tablet (20 mEq total) by mouth daily. 11/07/15   Jola Schmidt, MD  repaglinide (PRANDIN) 2 MG tablet Take 2 mg by mouth 3 (three) times daily before meals. 01/10/15 01/10/16  Historical Provider, MD   BP 173/79 mmHg  Pulse 70  Temp(Src) 98.2 F (36.8 C) (Oral)  Resp 18  SpO2 100% Physical Exam  Constitutional: She is oriented to person, place, and time. She appears well-developed and well-nourished. No distress.  HENT:  Head: Normocephalic and atraumatic.  Eyes: EOM are normal.  Neck: Normal range of motion.  Cardiovascular: Normal rate, regular rhythm and normal heart sounds.   Pulmonary/Chest: Effort normal and breath sounds normal.  Abdominal:  Soft. She exhibits no distension. There is no tenderness.  Musculoskeletal: Normal range of motion.  Neurological: She is alert and oriented to person, place, and time.  Skin: Skin is warm and dry.  Psychiatric: She has a normal mood and affect. Judgment normal.  Nursing note and vitals reviewed.   ED Course  Procedures (including critical care time) Labs Review Labs Reviewed  COMPREHENSIVE METABOLIC PANEL - Abnormal; Notable for the following:    Potassium 2.9 (*)    Chloride 99 (*)    Glucose, Bld 292 (*)  BUN 26 (*)    Creatinine, Ser 1.30 (*)    ALT 13 (*)    Total Bilirubin 1.4 (*)    GFR calc non Af Amer 39 (*)    GFR calc Af Amer 46 (*)    All other components within normal limits  URINALYSIS, ROUTINE W REFLEX MICROSCOPIC (NOT AT [facility]) - Abnormal; Notable for the following:    Glucose, UA >1000 (*)    Hgb urine dipstick SMALL (*)    Ketones, ur 15 (*)    Protein, ur 30 (*)    All other components within normal limits  URINE MICROSCOPIC-ADD ON - Abnormal; Notable for the following:    Squamous Epithelial / LPF 0-5 (*)    All other components within normal limits  LIPASE, BLOOD  CBC    Imaging Review No results found. I have personally reviewed and evaluated these images and lab results as part of my medical decision-making.   EKG Interpretation None      MDM   Final diagnoses:  Nausea and vomiting, vomiting of unspecified type  Dehydration  Hypokalemia    Patient feels much better after IV hydration emergency department was [place] antinausea medicine.  She seemed to respond well to the IV Reglan.  She'll be discharged home with a prescription for Reglan as well as Zofran.  She is mildly hypokalemic this can be replaced at home.  She has mild renal insufficiency.  This will need to be rechecked.  She's been given IV fluids and feeling much better.  She's keeping oral fluids down the emergency department.  No indication for additional workup or management in  the ER.  I do not think she needs to be admitted to the hospital at this time.     Jola Schmidt, MD 11/07/15 [phone]

## 2015-11-07 NOTE — ED Notes (Signed)
Cup of ice water provided to pt; pt urged to take sips

## 2015-11-07 NOTE — ED Notes (Signed)
Per Dr. Venora Maples, attempt oral fluids with pt

## 2015-11-07 NOTE — Discharge Instructions (Signed)

## 2015-11-07 NOTE — ED Notes (Signed)
Pt drank half cup of fluids with no emesis

## 2015-12-04 ENCOUNTER — Other Ambulatory Visit: Payer: Self-pay | Admitting: Internal Medicine

## 2015-12-04 DIAGNOSIS — R1084 Generalized abdominal pain: Secondary | ICD-10-CM

## 2015-12-07 ENCOUNTER — Ambulatory Visit
Admission: RE | Admit: 2015-12-07 | Discharge: 2015-12-07 | Disposition: A | Discharge status: Home / Self Care | Payer: Medicare Other | Source: Ambulatory Visit | Attending: Internal Medicine | Admitting: Internal Medicine

## 2015-12-07 DIAGNOSIS — R1084 Generalized abdominal pain: Secondary | ICD-10-CM

## 2016-04-16 ENCOUNTER — Other Ambulatory Visit: Payer: Self-pay | Admitting: Physician Assistant

## 2016-04-16 DIAGNOSIS — E2839 Other primary ovarian failure: Secondary | ICD-10-CM

## 2016-04-28 ENCOUNTER — Other Ambulatory Visit: Payer: Medicare Other

## 2016-04-29 ENCOUNTER — Ambulatory Visit
Admission: RE | Admit: 2016-04-29 | Discharge: 2016-04-29 | Disposition: A | Discharge status: Home / Self Care | Payer: Medicare Other | Source: Ambulatory Visit | Attending: Physician Assistant | Admitting: Physician Assistant

## 2016-04-29 DIAGNOSIS — E2839 Other primary ovarian failure: Secondary | ICD-10-CM

## 2016-05-01 ENCOUNTER — Ambulatory Visit: Payer: Self-pay | Admitting: Physician Assistant

## 2016-05-09 NOTE — Pre-Procedure Instructions (Signed)
Sara Graves  05/09/2016      [id number] Sara Graves, [place] [address] Phone: [phone] Fax: [phone]  [facility] [phone] [place] Sara Graves, [place] [address] Phone: [phone] Fax: [phone]  [id number] Sara Graves, [place] [place] [address] [phone] [phone]    Your procedure is scheduled on 05/15/16.  Report to [facility] Admitting at 530 A.M.  Call this number if you have problems the morning of surgery:  [phone]   Remember:  Do not eat food or drink liquids after midnight.  Take these medicines the morning of surgery with A SIP OF WATER --BUSPAR,PROZAC,SYNTHROID,PRILOSEC,PROTONIX Do not take any aspirin,anti-inflammatories,vitamins,or herbal supplements 5-7 days prior to surgery.  How to Manage Your Diabetes Before and After Surgery  Why is it important to control my blood sugar before and after surgery? . Improving blood sugar levels before and after surgery helps healing and can limit problems. . A way of improving blood sugar control is eating a healthy diet by: o  Eating less sugar and carbohydrates o  Increasing activity/exercise o  Talking with your doctor about reaching your blood sugar goals . High blood sugars (greater than 180 mg/dL) can raise your risk of infections and slow your recovery, so you will need to focus on controlling your diabetes during the weeks before surgery. . Make sure that the doctor who takes care of your diabetes knows about your planned surgery including the date and location.  How do I manage my blood sugar before surgery? . Check your blood sugar at least 4 times a day, starting 2 days before surgery, to make sure that the level is not too high or low. o Check your blood sugar the morning of your surgery when you wake up and every 2  hours until you get to the Short Stay unit. . If your blood sugar is less than 70 mg/dL, you will need to treat for low blood sugar: o Do not take insulin. o Treat a low blood sugar (less than 70 mg/dL) with  cup of clear juice (cranberry or apple), 4 glucose tablets, OR glucose gel. o Recheck blood sugar in 15 minutes after treatment (to make sure it is greater than 70 mg/dL). If your blood sugar is not greater than 70 mg/dL on recheck, call [phone] for further instructions. . Report your blood sugar to the short stay nurse when you get to Short Stay.  . If you are admitted to the hospital after surgery: o Your blood sugar will be checked by the staff and you will probably be given insulin after surgery (instead of oral diabetes medicines) to make sure you have good blood sugar levels. o The goal for blood sugar control after surgery is 80-180 mg/dL.              WHAT DO I DO ABOUT MY DIABETES MEDICATION?   Marland Kitchen Do not take oral diabetes medicines (pills) the morning of surgery. . THE NIGHT BEFORE SURGERY, take ________15___ units of ________glargine___insulin.       Marland Kitchen HE MORNING OF SURGERY, take _____15________ units of ______glargine____insulin.  . The day of surgery, do not take other diabetes injectables, including Byetta (exenatide), Bydureon (exenatide ER), Victoza (liraglutide), or Trulicity (dulaglutide).  . If your CBG is greater than 220 mg/dL, you may take  of your sliding scale (  correction) dose of insulin.  Other Instructions:          Patient Signature:  Date:   Nurse Signature:  Date:   Reviewed and Endorsed by [facility] Patient Education Committee, August 2015  Do not wear jewelry, make-up or nail polish.  Do not wear lotions, powders, or perfumes.  You may wear deoderant.  Do not shave 48 hours prior to surgery.  Men may shave face and neck.  Do not bring valuables to the hospital.  [facility] is not responsible for any belongings or  valuables.  Contacts, dentures or bridgework may not be worn into surgery.  Leave your suitcase in the car.  After surgery it may be brought to your room.  For patients admitted to the hospital, discharge time will be determined by your treatment team.  Patients discharged the day of surgery will not be allowed to drive home.   Name and phone number of your driver:   Special instructions:    Please read over the following fact sheets that you were given.

## 2016-05-12 ENCOUNTER — Inpatient Hospital Stay (HOSPITAL_COMMUNITY)
Admission: RE | Admit: 2016-05-12 | Discharge: 2016-05-12 | Disposition: A | Discharge status: Home / Self Care | Payer: Medicare Other | Source: Ambulatory Visit

## 2016-05-17 DIAGNOSIS — N390 Urinary tract infection, site not specified: Secondary | ICD-10-CM

## 2016-05-17 HISTORY — DX: Urinary tract infection, site not specified: N39.0

## 2016-06-18 ENCOUNTER — Ambulatory Visit: Payer: Self-pay | Admitting: Physician Assistant

## 2016-06-26 DIAGNOSIS — R809 Proteinuria, unspecified: Secondary | ICD-10-CM | POA: Insufficient documentation

## 2016-06-26 NOTE — Pre-Procedure Instructions (Signed)
Sara Graves  06/26/2016     Your procedure is scheduled on : Thursday July 03, 2016 at 7:30 AM.  Report to [facility] Admitting at 5:30 AM.  Call this number if you have problems the morning of surgery: [phone]    Remember:  Do not eat food or drink liquids after midnight.  Take these medicines the morning of surgery with A SIP OF WATER : Buspirone (Buspar), Fluoxetine (Prozac), Levothyroxine (Synthroid), Metoprolol (Toprol XL), Pantoprazole (Protonix), Ranitidine (Zantac)   Stop taking any vitamins, herbal medications/supplements, NSAIDs, Ibuprofen, Advil, Motrin, Aleve, Omega 3/ Fish oil, etc today    How to Manage Your Diabetes Before and After Surgery  Why is it important to control my blood sugar before and after surgery? . Improving blood sugar levels before and after surgery helps healing and can limit problems. . A way of improving blood sugar control is eating a healthy diet by: o  Eating less sugar and carbohydrates o  Increasing activity/exercise o  Talking with your doctor about reaching your blood sugar goals . High blood sugars (greater than 180 mg/dL) can raise your risk of infections and slow your recovery, so you will need to focus on controlling your diabetes during the weeks before surgery. . Make sure that the doctor who takes care of your diabetes knows about your planned surgery including the date and location.  How do I manage my blood sugar before surgery? . Check your blood sugar at least 4 times a day, starting 2 days before surgery, to make sure that the level is not too high or low. o Check your blood sugar the morning of your surgery when you wake up and every 2 hours until you get to the Short Stay unit. . If your blood sugar is less than 70 mg/dL, you will need to treat for low blood sugar: o Do not take insulin. o Treat a low blood sugar (less than 70 mg/dL) with  cup of clear juice (cranberry or apple), 4 glucose tablets, OR  glucose gel. o Recheck blood sugar in 15 minutes after treatment (to make sure it is greater than 70 mg/dL). If your blood sugar is not greater than 70 mg/dL on recheck, call [phone] for further instructions. . Report your blood sugar to the short stay nurse when you get to Short Stay.  . If you are admitted to the hospital after surgery: o Your blood sugar will be checked by the staff and you will probably be given insulin after surgery (instead of oral diabetes medicines) to make sure you have good blood sugar levels. o The goal for blood sugar control after surgery is 80-180 mg/dL.      WHAT DO I DO ABOUT MY DIABETES MEDICATION?   Marland Kitchen Do not take oral diabetes medicines (pills) the morning of surgery.  The night before your surgery and the morning of your surgery only take 15 Units of Toujeo insulin  Reviewed and Endorsed by [facility] Patient Education Committee, August 2015   Do not wear jewelry, make-up or nail polish.  Do not wear lotions, powders, or perfumes.    Do not shave 48 hours prior to surgery.    Do not bring valuables to the hospital.  [facility] is not responsible for any belongings or valuables.  Contacts, dentures or bridgework may not be worn into surgery.  Leave your suitcase in the car.  After surgery it may be brought to your room.  For patients admitted  to the hospital, discharge time will be determined by your treatment team.  Patients discharged the day of surgery will not be allowed to drive home.   Name and phone number of your driver:    Special instructions:  Shower using CHG soap the night before and the morning of your surgery  Please read over the following fact sheets that you were given.

## 2016-06-27 ENCOUNTER — Encounter (HOSPITAL_COMMUNITY): Payer: Self-pay | Admitting: Surgery

## 2016-06-27 ENCOUNTER — Encounter (HOSPITAL_COMMUNITY)
Admission: RE | Admit: 2016-06-27 | Discharge: 2016-06-27 | Disposition: A | Discharge status: Home / Self Care | Payer: Medicare Other | Source: Ambulatory Visit | Attending: Orthopedic Surgery | Admitting: Orthopedic Surgery

## 2016-06-27 DIAGNOSIS — Z79899 Other long term (current) drug therapy: Secondary | ICD-10-CM | POA: Insufficient documentation

## 2016-06-27 DIAGNOSIS — Z01818 Encounter for other preprocedural examination: Secondary | ICD-10-CM | POA: Diagnosis not present

## 2016-06-27 DIAGNOSIS — Z794 Long term (current) use of insulin: Secondary | ICD-10-CM | POA: Insufficient documentation

## 2016-06-27 DIAGNOSIS — E785 Hyperlipidemia, unspecified: Secondary | ICD-10-CM | POA: Diagnosis not present

## 2016-06-27 DIAGNOSIS — Z01812 Encounter for preprocedural laboratory examination: Secondary | ICD-10-CM | POA: Insufficient documentation

## 2016-06-27 DIAGNOSIS — E039 Hypothyroidism, unspecified: Secondary | ICD-10-CM | POA: Diagnosis not present

## 2016-06-27 DIAGNOSIS — I10 Essential (primary) hypertension: Secondary | ICD-10-CM | POA: Insufficient documentation

## 2016-06-27 DIAGNOSIS — K219 Gastro-esophageal reflux disease without esophagitis: Secondary | ICD-10-CM | POA: Diagnosis not present

## 2016-06-27 DIAGNOSIS — E119 Type 2 diabetes mellitus without complications: Secondary | ICD-10-CM | POA: Insufficient documentation

## 2016-06-27 DIAGNOSIS — Z7982 Long term (current) use of aspirin: Secondary | ICD-10-CM | POA: Diagnosis not present

## 2016-06-27 DIAGNOSIS — I498 Other specified cardiac arrhythmias: Secondary | ICD-10-CM | POA: Insufficient documentation

## 2016-06-27 HISTORY — DX: Basal cell carcinoma of skin of nose: C44.311

## 2016-06-27 HISTORY — DX: Anxiety disorder, unspecified: F41.9

## 2016-06-27 HISTORY — DX: Gastro-esophageal reflux disease without esophagitis: K21.9

## 2016-06-27 HISTORY — DX: Urinary tract infection, site not specified: N39.0

## 2016-06-27 HISTORY — DX: Cat-scratch disease: A28.1

## 2016-06-27 LAB — CBC
HEMATOCRIT: 33.8 % — AB (ref 36.0–46.0)
Hemoglobin: 12.1 g/dL (ref 12.0–15.0)
MCH: 32 pg (ref 26.0–34.0)
MCHC: 35.8 g/dL (ref 30.0–36.0)
MCV: 89.4 fL (ref 78.0–100.0)
Platelets: 109 10*3/uL — ABNORMAL LOW (ref 150–400)
RBC: 3.78 MIL/uL — ABNORMAL LOW (ref 3.87–5.11)
RDW: 13.6 % (ref 11.5–15.5)
WBC: 5 10*3/uL (ref 4.0–10.5)

## 2016-06-27 LAB — BASIC METABOLIC PANEL
Anion gap: 9 (ref 5–15)
BUN: 19 mg/dL (ref 6–20)
CALCIUM: 9.4 mg/dL (ref 8.9–10.3)
CO2: 28 mmol/L (ref 22–32)
CREATININE: 0.82 mg/dL (ref 0.44–1.00)
Chloride: 100 mmol/L — ABNORMAL LOW (ref 101–111)
GFR calc Af Amer: >60 mL/min (ref >60)
GFR calc non Af Amer: >60 mL/min (ref >60)
GLUCOSE: 247 mg/dL — AB (ref 65–99)
Potassium: 4.3 mmol/L (ref 3.5–5.1)
Sodium: 137 mmol/L (ref 135–145)

## 2016-06-27 LAB — GLUCOSE, CAPILLARY: Glucose-Capillary: 247 mg/dL — ABNORMAL HIGH (ref 65–99)

## 2016-06-27 NOTE — Progress Notes (Signed)
PCP is Roe Coombs, PA-C  Patient was accompanied to PAT by her husband.   Cardiologist is [name]. Patient stated "I went to see [name] a few times, but he says my heart is fine." LOV was "sometime this year". Patient stated "I've seen him twice this year."  Patient informed Nurse that her surgery was recently canceled due to her having nausea and vomiting. Patient stated "I feel better now," and patient denied having any N/V. Scopolamine patch noted behind patients ear.  Patient informed Nurse that she had a UTI in July 2017, but was given Keflex to take and has completed it as prescribed.  CBG on arrival to PAT was 247, and patient denied any food consumption, but informed Nurse that she did "drank a little bit of Gatorade." Patient informed Nurse that her blood glucose typically ranges from 80-240, her last A1C was 7.0, and that she took Toujeo this morning.   Will send chart to Anesthesia for review

## 2016-06-27 NOTE — Progress Notes (Signed)
PCR results were not found in EPIC. Nurse called microbiology to inquire about results and staff informed Nurse that the PCR swab was not sent to lab. Therefore PCR will be need to be done DOS.

## 2016-06-28 LAB — HEMOGLOBIN A1C
Hgb A1c MFr Bld: 9.6 % — ABNORMAL HIGH (ref 4.8–5.6)
MEAN PLASMA GLUCOSE: 229 mg/dL

## 2016-06-30 NOTE — Progress Notes (Addendum)
Anesthesia Chart Review:  Pt is a 75 year old female scheduled for spinal cord stimulator battery replacement on 07/03/2016 with Melina Schools, MD.   - PCP is Roe Coombs, PA - Cardiologist is Adrian Prows, MD who is aware of upcoming surgery.   PMH includes:  HTN, DM, hyperlipidemia, hypothyroidism, GERD. Never smoker. BMI 31. S/p L rotator cuff repair 12/28/12 and 09/07/12.   Medications include: ASA, lasix, toujeo, levothyroxine, metoprolol, protonix, zantac, crestor, scopalamine patch, valsartan-hctz  Preoperative labs reviewed.  HgbA1c 9.6, glucose 247  EKG 06/27/16: Sinus rhythm with marked sinus arrhythmia. Minimal voltage criteria for LVH, may be normal variant. nonspecific T-wave abnormalities seen  If no changes, I anticipate pt can proceed with surgery as scheduled.   Willeen Cass, FNP-BC [facility] Short Stay Surgical [facility]/Anesthesiology Phone: [phone] 07/01/2016 1:07 PM

## 2016-07-02 MED ORDER — CEFAZOLIN SODIUM-DEXTROSE 2-4 GM/100ML-% IV SOLN
2.0000 g | INTRAVENOUS | Status: AC
  Administered 2016-07-03: 2 g via INTRAVENOUS
  Filled 2016-07-02: qty 100

## 2016-07-02 NOTE — Anesthesia Preprocedure Evaluation (Signed)
Anesthesia Evaluation  Patient identified by MRN, date of birth, ID band Patient awake    Reviewed: Allergy & Precautions, H&P , NPO status , Patient's Chart, lab work & pertinent test results  Airway Mallampati: II  TM Distance: >3 FB Neck ROM: Full    Dental  (+) Dental Advisory Given, Edentulous Upper, Partial Lower   Pulmonary neg pulmonary ROS,    Pulmonary exam normal breath sounds clear to auscultation       Cardiovascular hypertension, Pt. on medications (-) CHF Normal cardiovascular exam Rhythm:Regular Rate:Normal     Neuro/Psych PSYCHIATRIC DISORDERS Diabetic neuropathy  Neuromuscular disease    GI/Hepatic negative GI ROS, Neg liver ROS,   Endo/Other  diabetes, Poorly Controlled, Type 2, Insulin DependentHypothyroidism   Renal/GU      Musculoskeletal  (+) Arthritis , Osteoarthritis,    Abdominal (+) + obese,   Peds  Hematology   Anesthesia Other Findings   Reproductive/Obstetrics                             Anesthesia Physical  Anesthesia Plan  ASA: III  Anesthesia Plan: General   Post-op Pain Management:    Induction: Intravenous  Airway Management Planned: Oral ETT  Additional Equipment:   Intra-op Plan:   Post-operative Plan: Extubation in OR  Informed Consent: I have reviewed the patients History and Physical, chart, labs and discussed the procedure including the risks, benefits and alternatives for the proposed anesthesia with the patient or authorized representative who has indicated his/her understanding and acceptance.   Dental advisory given  Plan Discussed with: CRNA  Anesthesia Plan Comments:       Anesthesia Quick Evaluation

## 2016-07-03 ENCOUNTER — Encounter (HOSPITAL_COMMUNITY): Payer: Self-pay | Admitting: Certified Registered Nurse Anesthetist

## 2016-07-03 ENCOUNTER — Ambulatory Visit (HOSPITAL_COMMUNITY)
Admission: RE | Admit: 2016-07-03 | Discharge: 2016-07-03 | Disposition: A | Discharge status: Home / Self Care | Payer: Medicare Other | Source: Ambulatory Visit | Attending: Orthopedic Surgery | Admitting: Orthopedic Surgery

## 2016-07-03 ENCOUNTER — Ambulatory Visit (HOSPITAL_COMMUNITY): Payer: Medicare Other | Admitting: Anesthesiology

## 2016-07-03 ENCOUNTER — Ambulatory Visit (HOSPITAL_COMMUNITY): Payer: Medicare Other | Admitting: Emergency Medicine

## 2016-07-03 ENCOUNTER — Encounter (HOSPITAL_COMMUNITY)
Admission: RE | Disposition: A | Discharge status: Home / Self Care | Payer: Self-pay | Source: Ambulatory Visit | Attending: Orthopedic Surgery

## 2016-07-03 DIAGNOSIS — E039 Hypothyroidism, unspecified: Secondary | ICD-10-CM | POA: Diagnosis not present

## 2016-07-03 DIAGNOSIS — Z85828 Personal history of other malignant neoplasm of skin: Secondary | ICD-10-CM | POA: Insufficient documentation

## 2016-07-03 DIAGNOSIS — E785 Hyperlipidemia, unspecified: Secondary | ICD-10-CM | POA: Diagnosis not present

## 2016-07-03 DIAGNOSIS — Z794 Long term (current) use of insulin: Secondary | ICD-10-CM | POA: Insufficient documentation

## 2016-07-03 DIAGNOSIS — Z7982 Long term (current) use of aspirin: Secondary | ICD-10-CM | POA: Insufficient documentation

## 2016-07-03 DIAGNOSIS — Z6831 Body mass index (BMI) 31.0-31.9, adult: Secondary | ICD-10-CM | POA: Insufficient documentation

## 2016-07-03 DIAGNOSIS — K219 Gastro-esophageal reflux disease without esophagitis: Secondary | ICD-10-CM | POA: Insufficient documentation

## 2016-07-03 DIAGNOSIS — F329 Major depressive disorder, single episode, unspecified: Secondary | ICD-10-CM | POA: Diagnosis not present

## 2016-07-03 DIAGNOSIS — I10 Essential (primary) hypertension: Secondary | ICD-10-CM | POA: Diagnosis not present

## 2016-07-03 DIAGNOSIS — F419 Anxiety disorder, unspecified: Secondary | ICD-10-CM | POA: Diagnosis not present

## 2016-07-03 DIAGNOSIS — E1140 Type 2 diabetes mellitus with diabetic neuropathy, unspecified: Secondary | ICD-10-CM | POA: Diagnosis not present

## 2016-07-03 DIAGNOSIS — Z79899 Other long term (current) drug therapy: Secondary | ICD-10-CM | POA: Diagnosis not present

## 2016-07-03 DIAGNOSIS — T85113A Breakdown (mechanical) of implanted electronic neurostimulator, generator, initial encounter: Secondary | ICD-10-CM | POA: Insufficient documentation

## 2016-07-03 DIAGNOSIS — Y831 Surgical operation with implant of artificial internal device as the cause of abnormal reaction of the patient, or of later complication, without mention of misadventure at the time of the procedure: Secondary | ICD-10-CM | POA: Insufficient documentation

## 2016-07-03 DIAGNOSIS — E669 Obesity, unspecified: Secondary | ICD-10-CM | POA: Insufficient documentation

## 2016-07-03 HISTORY — PX: SPINAL CORD STIMULATOR BATTERY EXCHANGE: SHX6202

## 2016-07-03 LAB — GLUCOSE, CAPILLARY
GLUCOSE-CAPILLARY: 205 mg/dL — AB (ref 65–99)
Glucose-Capillary: 259 mg/dL — ABNORMAL HIGH (ref 65–99)

## 2016-07-03 LAB — SURGICAL PCR SCREEN
MRSA, PCR: NEGATIVE
Staphylococcus aureus: NEGATIVE

## 2016-07-03 SURGERY — SPINAL CORD STIMULATOR BATTERY EXCHANGE
Anesthesia: General

## 2016-07-03 MED ORDER — ONDANSETRON HCL 4 MG/2ML IJ SOLN
INTRAMUSCULAR | Status: DC | PRN
  Administered 2016-07-03: 4 mg via INTRAVENOUS

## 2016-07-03 MED ORDER — ONDANSETRON HCL 4 MG PO TABS: 4.0000 mg | ORAL_TABLET | Freq: Three times a day (TID) | ORAL | 0 refills | Status: DC | PRN

## 2016-07-03 MED ORDER — GLYCOPYRROLATE 0.2 MG/ML IJ SOLN
INTRAMUSCULAR | Status: DC | PRN
  Administered 2016-07-03 (×2): 0.2 mg via INTRAVENOUS

## 2016-07-03 MED ORDER — SUGAMMADEX SODIUM 200 MG/2ML IV SOLN
INTRAVENOUS | Status: AC
  Filled 2016-07-03: qty 2

## 2016-07-03 MED ORDER — MUPIROCIN 2 % EX OINT
1.0000 "application " | TOPICAL_OINTMENT | Freq: Once | CUTANEOUS | Status: AC
  Administered 2016-07-03: 1 via TOPICAL
  Filled 2016-07-03: qty 22

## 2016-07-03 MED ORDER — SUGAMMADEX SODIUM 200 MG/2ML IV SOLN: INTRAVENOUS | Status: DC | PRN

## 2016-07-03 MED ORDER — LACTATED RINGERS IV SOLN
INTRAVENOUS | Status: DC | PRN
  Administered 2016-07-03: 07:00:00 via INTRAVENOUS

## 2016-07-03 MED ORDER — ONDANSETRON HCL 4 MG/2ML IJ SOLN
INTRAMUSCULAR | Status: AC
  Filled 2016-07-03: qty 2

## 2016-07-03 MED ORDER — ACETAMINOPHEN 10 MG/ML IV SOLN
INTRAVENOUS | Status: DC | PRN
  Administered 2016-07-03: 1000 mg via INTRAVENOUS

## 2016-07-03 MED ORDER — 0.9 % SODIUM CHLORIDE (POUR BTL) OPTIME
TOPICAL | Status: DC | PRN
  Administered 2016-07-03: 1000 mL

## 2016-07-03 MED ORDER — LIDOCAINE 2% (20 MG/ML) 5 ML SYRINGE
INTRAMUSCULAR | Status: AC
  Filled 2016-07-03: qty 5

## 2016-07-03 MED ORDER — ARTIFICIAL TEARS OP OINT
TOPICAL_OINTMENT | OPHTHALMIC | Status: DC | PRN
  Administered 2016-07-03: 1 via OPHTHALMIC

## 2016-07-03 MED ORDER — FENTANYL CITRATE (PF) 100 MCG/2ML IJ SOLN
INTRAMUSCULAR | Status: AC
  Filled 2016-07-03: qty 2

## 2016-07-03 MED ORDER — PHENYLEPHRINE HCL 10 MG/ML IJ SOLN
INTRAMUSCULAR | Status: DC | PRN
  Administered 2016-07-03: 160 ug via INTRAVENOUS
  Administered 2016-07-03 (×2): 120 ug via INTRAVENOUS

## 2016-07-03 MED ORDER — BUPIVACAINE-EPINEPHRINE 0.25% -1:200000 IJ SOLN
INTRAMUSCULAR | Status: DC | PRN
  Administered 2016-07-03: 20 mL

## 2016-07-03 MED ORDER — ROCURONIUM BROMIDE 10 MG/ML (PF) SYRINGE
PREFILLED_SYRINGE | INTRAVENOUS | Status: AC
  Filled 2016-07-03: qty 10

## 2016-07-03 MED ORDER — ARTIFICIAL TEARS OP OINT
TOPICAL_OINTMENT | OPHTHALMIC | Status: AC
  Filled 2016-07-03: qty 3.5

## 2016-07-03 MED ORDER — THROMBIN 20000 UNITS EX SOLR
CUTANEOUS | Status: AC
  Filled 2016-07-03: qty 20000

## 2016-07-03 MED ORDER — EPHEDRINE SULFATE 50 MG/ML IJ SOLN
INTRAMUSCULAR | Status: DC | PRN
  Administered 2016-07-03: 10 mg via INTRAVENOUS

## 2016-07-03 MED ORDER — PROPOFOL 500 MG/50ML IV EMUL
INTRAVENOUS | Status: AC
  Filled 2016-07-03: qty 50

## 2016-07-03 MED ORDER — ACETAMINOPHEN 10 MG/ML IV SOLN
INTRAVENOUS | Status: AC
  Filled 2016-07-03: qty 100

## 2016-07-03 MED ORDER — PROPOFOL 10 MG/ML IV BOLUS
INTRAVENOUS | Status: DC | PRN
  Administered 2016-07-03: 120 mg via INTRAVENOUS

## 2016-07-03 MED ORDER — HYDROCODONE-ACETAMINOPHEN 5-325 MG PO TABS: 1.0000 | ORAL_TABLET | ORAL | 0 refills | Status: DC | PRN

## 2016-07-03 MED ORDER — SUGAMMADEX SODIUM 200 MG/2ML IV SOLN
INTRAVENOUS | Status: DC | PRN
  Administered 2016-07-03: 200 mg via INTRAVENOUS

## 2016-07-03 MED ORDER — GLYCOPYRROLATE 0.2 MG/ML IV SOSY
PREFILLED_SYRINGE | INTRAVENOUS | Status: AC
  Filled 2016-07-03: qty 3

## 2016-07-03 MED ORDER — PROPOFOL 500 MG/50ML IV EMUL
INTRAVENOUS | Status: DC | PRN
  Administered 2016-07-03: 25 ug/kg/min via INTRAVENOUS

## 2016-07-03 MED ORDER — LIDOCAINE HCL (CARDIAC) 20 MG/ML IV SOLN
INTRAVENOUS | Status: DC | PRN
  Administered 2016-07-03: 100 mg via INTRATRACHEAL

## 2016-07-03 MED ORDER — ROCURONIUM BROMIDE 100 MG/10ML IV SOLN
INTRAVENOUS | Status: DC | PRN
  Administered 2016-07-03: 40 mg via INTRAVENOUS

## 2016-07-03 MED ORDER — BUPIVACAINE-EPINEPHRINE (PF) 0.25% -1:200000 IJ SOLN
INTRAMUSCULAR | Status: AC
  Filled 2016-07-03: qty 30

## 2016-07-03 MED ORDER — BUPIVACAINE LIPOSOME 1.3 % IJ SUSP
20.0000 mL | INTRAMUSCULAR | Status: DC
  Filled 2016-07-03: qty 20

## 2016-07-03 MED ORDER — BUPIVACAINE LIPOSOME 1.3 % IJ SUSP
INTRAMUSCULAR | Status: DC | PRN
  Administered 2016-07-03: 20 mL

## 2016-07-03 MED ORDER — FENTANYL CITRATE (PF) 100 MCG/2ML IJ SOLN
INTRAMUSCULAR | Status: DC | PRN
  Administered 2016-07-03 (×2): 50 ug via INTRAVENOUS

## 2016-07-03 MED ORDER — PHENYLEPHRINE 40 MCG/ML (10ML) SYRINGE FOR IV PUSH (FOR BLOOD PRESSURE SUPPORT)
PREFILLED_SYRINGE | INTRAVENOUS | Status: AC
  Filled 2016-07-03: qty 10

## 2016-07-03 SURGICAL SUPPLY — 62 items
CANISTER SUCTION 2500CC (MISCELLANEOUS) ×3 IMPLANT
CLOSURE STERI-STRIP 1/2X4 (GAUZE/BANDAGES/DRESSINGS) ×1
CLOSURE WOUND 1/2 X4 (GAUZE/BANDAGES/DRESSINGS) ×1
CLSR STERI-STRIP ANTIMIC 1/2X4 (GAUZE/BANDAGES/DRESSINGS) ×2 IMPLANT
CORDS BIPOLAR (ELECTRODE) ×3 IMPLANT
DRAPE C-ARM 42X72 X-RAY (DRAPES) ×3 IMPLANT
DRAPE C-ARMOR (DRAPES) IMPLANT
DRAPE INCISE IOBAN 66X45 STRL (DRAPES) ×3 IMPLANT
DRAPE PED LAPAROTOMY (DRAPES) ×3 IMPLANT
DRAPE POUCH INSTRU U-SHP 10X18 (DRAPES) ×3 IMPLANT
DRAPE SURG 17X23 STRL (DRAPES) ×3 IMPLANT
DRAPE U-SHAPE 47X51 STRL (DRAPES) ×3 IMPLANT
DRSG AQUACEL AG ADV 3.5X 4 (GAUZE/BANDAGES/DRESSINGS) ×3 IMPLANT
DRSG AQUACEL AG ADV 3.5X 6 (GAUZE/BANDAGES/DRESSINGS) ×3 IMPLANT
DURAPREP 26ML APPLICATOR (WOUND CARE) ×3 IMPLANT
ELECT BLADE 4.0 EZ CLEAN MEGAD (MISCELLANEOUS) ×3
ELECT PENCIL ROCKER SW 15FT (MISCELLANEOUS) ×3 IMPLANT
ELECT REM PT RETURN 9FT ADLT (ELECTROSURGICAL) ×3
ELECTRODE BLDE 4.0 EZ CLN MEGD (MISCELLANEOUS) ×1 IMPLANT
ELECTRODE REM PT RTRN 9FT ADLT (ELECTROSURGICAL) ×1 IMPLANT
GENERATOR PULSE PROCLAIM 5ELIT (Neuro Prosthesis/Implant) ×1 IMPLANT
GLOVE BIO SURGEON STRL SZ 6.5 (GLOVE) ×2 IMPLANT
GLOVE BIO SURGEONS STRL SZ 6.5 (GLOVE) ×1
GLOVE BIOGEL PI IND STRL 6.5 (GLOVE) ×1 IMPLANT
GLOVE BIOGEL PI IND STRL 8.5 (GLOVE) ×1 IMPLANT
GLOVE BIOGEL PI INDICATOR 6.5 (GLOVE) ×2
GLOVE BIOGEL PI INDICATOR 8.5 (GLOVE) ×2
GLOVE SS BIOGEL STRL SZ 8.5 (GLOVE) ×2 IMPLANT
GLOVE SUPERSENSE BIOGEL SZ 8.5 (GLOVE) ×4
GOWN STRL REUS W/ TWL LRG LVL3 (GOWN DISPOSABLE) ×2 IMPLANT
GOWN STRL REUS W/TWL 2XL LVL3 (GOWN DISPOSABLE) ×3 IMPLANT
GOWN STRL REUS W/TWL LRG LVL3 (GOWN DISPOSABLE) ×6
KIT BASIN OR (CUSTOM PROCEDURE TRAY) ×3 IMPLANT
KIT ROOM TURNOVER OR (KITS) ×3 IMPLANT
NDL SUT 6 .5 CRC .975X.05 MAYO (NEEDLE) ×1 IMPLANT
NEEDLE 22X1 1/2 (OR ONLY) (NEEDLE) IMPLANT
NEEDLE MAYO TAPER (NEEDLE) ×3
NEEDLE SPNL 18GX3.5 QUINCKE PK (NEEDLE) ×6 IMPLANT
NS IRRIG 1000ML POUR BTL (IV SOLUTION) ×3 IMPLANT
PACK GENERAL/GYN (CUSTOM PROCEDURE TRAY) ×3 IMPLANT
PACK UNIVERSAL I (CUSTOM PROCEDURE TRAY) ×3 IMPLANT
PAD ARMBOARD 7.5X6 YLW CONV (MISCELLANEOUS) ×6 IMPLANT
PATTIES SURGICAL .5 X.5 (GAUZE/BANDAGES/DRESSINGS) ×3 IMPLANT
PULSE GENERATOR PROCLAIM 5ELIT (Neuro Prosthesis/Implant) ×3 IMPLANT
SPATULA SILICONE BRAIN 10MM (MISCELLANEOUS) IMPLANT
SPONGE LAP 4X18 X RAY DECT (DISPOSABLE) ×3 IMPLANT
STAPLER VISISTAT 35W (STAPLE) ×3 IMPLANT
STRIP CLOSURE SKIN 1/2X4 (GAUZE/BANDAGES/DRESSINGS) ×2 IMPLANT
SURGIFLO W/THROMBIN 8M KIT (HEMOSTASIS) IMPLANT
SUT BONE WAX W31G (SUTURE) IMPLANT
SUT FIBERWIRE #2 38 T-5 BLUE (SUTURE)
SUT MON AB 3-0 SH 27 (SUTURE) ×3
SUT MON AB 3-0 SH27 (SUTURE) ×1 IMPLANT
SUT VIC AB 1 CT1 27 (SUTURE) ×6
SUT VIC AB 1 CT1 27XBRD ANBCTR (SUTURE) ×2 IMPLANT
SUT VIC AB 1 CTX 18 (SUTURE) ×3 IMPLANT
SUT VIC AB 2-0 CT1 18 (SUTURE) IMPLANT
SUTURE FIBERWR #2 38 T-5 BLUE (SUTURE) IMPLANT
SYR CONTROL 10ML LL (SYRINGE) IMPLANT
TOWEL OR 17X24 6PK STRL BLUE (TOWEL DISPOSABLE) ×6 IMPLANT
TOWEL OR 17X26 10 PK STRL BLUE (TOWEL DISPOSABLE) ×3 IMPLANT
WATER STERILE IRR 1000ML POUR (IV SOLUTION) ×3 IMPLANT

## 2016-07-03 NOTE — Anesthesia Procedure Notes (Signed)
Procedure Name: Intubation Date/Time: 07/03/2016 7:40 AM Performed by: Collier Bullock Pre-anesthesia Checklist: Patient identified, Emergency Drugs available, Suction available and Patient being monitored Patient Re-evaluated:Patient Re-evaluated prior to inductionOxygen Delivery Method: Circle system utilized Preoxygenation: Pre-oxygenation with 100% oxygen Intubation Type: IV induction Ventilation: Mask ventilation without difficulty Laryngoscope Size: Mac and 3 Grade View: Grade I Tube type: Oral Tube size: 7.0 mm Number of attempts: 1 Airway Equipment and Method: Stylet Placement Confirmation: ETT inserted through vocal cords under direct vision,  positive ETCO2 and breath sounds checked- equal and bilateral Secured at: 23 cm Tube secured with: Tape Dental Injury: Teeth and Oropharynx as per pre-operative assessment

## 2016-07-03 NOTE — H&P (Signed)
Sara Dials, PA-C Chief Complaint: Failed SCS battery History: Pleasant 75 yr old female who had a SCS placed several yrs ago.  HAs been doing well with the unit until recently.  Battery no longer functioning properly.  Plan on replacement today  Past Medical History:  Diagnosis Date  . Anxiety   . Arthritis   . Basal cell carcinoma of nose    removed  . Bursitis of left shoulder   . Cat scratch fever    Late 90s  . Depression   . Diabetes mellitus, type II, insulin dependent ([place])   . GERD (gastroesophageal reflux disease)   . H. pylori infection 2008 and 1998   treated  . Hx of colonoscopy with polypectomy   . Hyperlipidemia   . Hypertension   . Hypothyroidism   . Multinodular goiter   . Pneumonia    hx of  . Rotator cuff tear, left recurrent   . Urge urinary incontinence   . UTI (lower urinary tract infection) 05/2016    Allergies  Allergen Reactions  . Rosiglitazone Maleate Anaphylaxis and Swelling      (ANTIDIABETIC MED.) All over body   . Morphine Other (See Comments)    SEVERE HYPOTENSION   . Elavil [Amitriptyline] Nausea Only  . Tramadol-Acetaminophen Rash    No current facility-administered medications on file prior to encounter.    Current Outpatient Prescriptions on File Prior to Encounter  Medication Sig Dispense Refill  . aspirin (BAYER CHILDRENS ASPIRIN) 81 MG chewable tablet Chew 81 mg by mouth daily.     . busPIRone (BUSPAR) 7.5 MG tablet Take 7.5 mg by mouth 2 (two) times daily.    . Calcium Carb-Cholecalciferol (CALCIUM 600 + D PO) Take 1 tablet by mouth daily.    Marland Kitchen FLUoxetine (PROZAC) 20 MG capsule Take 20 mg by mouth daily.    . furosemide (LASIX) 20 MG tablet TAKE ONE TABLET BY MOUTH IN THE MORNING AS NEEDED FOR SWELLING    . Insulin Glargine (TOUJEO SOLOSTAR) 300 UNIT/ML SOPN Inject 32 Units into the skin daily with breakfast.     . levothyroxine (SYNTHROID, LEVOTHROID) 50 MCG tablet Take 50 mcg by mouth every morning.     . Omega-3 Fatty  Acids (FISH OIL) 1000 MG CAPS Take 1,000 mg by mouth 2 (two) times daily. OTC fish oil    . pantoprazole (PROTONIX) 40 MG tablet Take 1 tablet (40 mg total) by mouth daily. Switch for any other PPI at similar dose and frequency (Patient taking differently: Take 40 mg by mouth daily as needed (heartburn and indigestion). ) 30 tablet 3  . rosuvastatin (CRESTOR) 10 MG tablet Take 1 tablet by mouth daily.  0  . scopolamine (TRANSDERM-SCOP) 1 MG/3DAYS Place 1 patch onto the skin every 3 (three) days.      Physical Exam: Vitals:   07/03/16 0606  BP: (!) 211/110  Pulse: 99  Resp: 20  Temp: 98.4 F (36.9 C)  A+O X3 No sob/cp.  Lungs CTA  Heart: RRR Abd soft/NT Compartments soft/nt No hip/knee/ankle pain with ROM Ambulating with cane No focal neuro deficits Battery site - marked   Image: No results found.  A/P: Patient presents for hardware replacement. All risks/benefits were explained and questions addressed Plan on replacing failed SCS battery  Will plan on d/c to home today and f/u in 2 weeks

## 2016-07-03 NOTE — Brief Op Note (Signed)
07/03/2016  8:28 AM  PATIENT:  Sara Graves  75 y.o. female  PRE-OPERATIVE DIAGNOSIS:  FAILED STIMULATOR SPINAL CORD BATTERY  POST-OPERATIVE DIAGNOSIS:  FAILED STIMULATOR SPINAL CORD BATTERY  PROCEDURE:  Procedure(s): SPINAL CORD STIMULATOR BATTERY PLACMENT (N/A)  SURGEON:  Surgeon(s) and Role:    * Melina Schools, MD - Primary  PHYSICIAN ASSISTANT:   ASSISTANTS: Carmen Mayo   ANESTHESIA:   general  EBL:  Total I/O In: 800 [I.V.:800] Out: 5 [Blood:5]  BLOOD ADMINISTERED:none  DRAINS: none   LOCAL MEDICATIONS USED:  MARCAINE (15cc)   and OTHER exparel (15cc)  SPECIMEN:  No Specimen  DISPOSITION OF SPECIMEN:  N/A  COUNTS:  YES  TOURNIQUET:  * No tourniquets in log *  DICTATION: .Other Dictation: Dictation Number [id number]  PLAN OF CARE: Discharge to home after PACU  PATIENT DISPOSITION:  PACU - hemodynamically stable.

## 2016-07-03 NOTE — Discharge Instructions (Signed)
Ok to shower in 5 days Do not remove the dressing Ok to take Advil or tylenol for pain instead of the Norco If norco causes itching or rash ok to take over the counter benadryl

## 2016-07-03 NOTE — Op Note (Signed)
NAME:  Sara Graves, Sara Graves               ACCOUNT NO.:  [id number]  MEDICAL RECORD NO.:  [id number]  LOCATION:  [facility]                         FACILITY:  [facility]  PHYSICIAN:  Greysin Medlen D. Rolena Infante, M.D. DATE OF BIRTH:  Apr 16, 1941  DATE OF PROCEDURE:  07/03/2016 DATE OF DISCHARGE:  07/03/2016                              OPERATIVE REPORT   PREOPERATIVE DIAGNOSES:  Symptomatic hardware.  Spinal cord stimulator battery failure.  POSTOPERATIVE DIAGNOSES:  Symptomatic hardware.  Spinal cord stimulator battery failure.  OPERATIVE PROCEDURE:  Placement of spinal cord stimulator.  SURGEON:  Vin Yonke D. Rolena Infante, M.D.  FIRST ASSISTANT:  [facility].  HISTORY:  This is a very pleasant 75 year old woman, who about 5 years ago had a spinal cord stimulator placed and has done well with it.  Her battery has since failed and is no longer recharging.  After interrogation by the rep, it is found the battery had failed.  As a result, we elected to replace the battery today.  OPERATIVE NOTE:  The patient was brought to the operating room and placed supine on the operating table.  After successful induction of general anesthesia, endotracheal intubation, TEDs, and SCDs were applied.  She was turned prone onto a chest and pelvic gel rolls.  The arms were tucked at the side.  All bony prominences were well padded and the right lower buttock region was prepped and draped in a standard fashion.  Time-out was taken to confirm the patient, procedure, and all other pertinent data.  Once this was completed, the previous battery incision site was re-incised and I sharply dissected down to the capsule.  I then using Metz scissors exposed the battery and delivered it out of the wound.  I disconnected the leads and cleaned them and then secured them into the new battery and torqued off the locking nuts.  The battery was tested by the rep and it was functioning.  I then deepened the cavity to place the new battery and then  irrigated the wound copiously with normal saline.  The battery was placed into the cavity and secured to the deep fascia with 2 Ethibond sutures.  I then irrigated again and then closed the fascia over the battery with interrupted #1 Vicryl sutures.  Marcaine 0.25% mixed with enalapril was then injected into the wound site for postoperative analgesia.  The remainder of the wound was closed with interrupted 2-0 Vicryl sutures and a 3-0 Monocryl stitch.  Steri-Strips and Aquacel dressing were applied and the patient was ultimately extubated, transferred to the PACU without incident.  In the PACU, she was alert and oriented x3.  Plan will be to have her discharged to home when she meets appropriate criteria.  She will follow up with me in 2 weeks.  I told her she can shower in 5 days and to leave the Aquacel dressing in place and do not remove it.  The patient was given a prescription for hydrocodone and Zofran for pain.  I did tell her that if she did not tolerate the hydrocodone, she can switch to just extra-strength Tylenol.     Dhalia Zingaro D. Rolena Infante, M.D.     DDB/MEDQ  D:  07/03/2016  T:  07/03/2016  Job:  [id number]  cc:   Dr. Rolena Infante' office

## 2016-07-03 NOTE — Transfer of Care (Signed)
Immediate Anesthesia Transfer of Care Note  Patient: Sara Graves  Procedure(s) Performed: Procedure(s): SPINAL CORD STIMULATOR BATTERY PLACMENT (N/A)  Patient Location: PACU  Anesthesia Type:General  Level of Consciousness: awake, alert , oriented and patient cooperative  Airway & Oxygen Therapy: Patient Spontanous Breathing and Patient connected to face mask oxygen  Post-op Assessment: Report given to RN, Post -op Vital signs reviewed and stable, Patient moving all extremities X 4 and Patient able to stick tongue midline  Post vital signs: Reviewed and stable  Last Vitals:  Vitals:   07/03/16 0606  BP: (!) 211/110  Pulse: 99  Resp: 20  Temp: 36.9 C    Last Pain:  Vitals:   07/03/16 0606  TempSrc: Oral      Patients Stated Pain Goal: 4 ([phone] [phone])  Complications: No apparent anesthesia complications

## 2016-07-05 NOTE — Anesthesia Postprocedure Evaluation (Signed)
Anesthesia Post Note  Patient: Sara Graves  Procedure(s) Performed: Procedure(s) (LRB): SPINAL CORD STIMULATOR BATTERY PLACMENT (N/A)  Patient location during evaluation: PACU Anesthesia Type: General Level of consciousness: sedated and patient cooperative Pain management: pain level controlled Vital Signs Assessment: post-procedure vital signs reviewed and stable Respiratory status: spontaneous breathing Cardiovascular status: stable Anesthetic complications: no    Last Vitals:  Vitals:   07/03/16 0930 07/03/16 0940  BP: (!) 168/74 92/82  Pulse:  63  Resp: 16 18  Temp:      Last Pain:  Vitals:   07/03/16 0606  TempSrc: Oral                 Nolon Nations    , .lk

## 2016-07-07 ENCOUNTER — Encounter (HOSPITAL_COMMUNITY): Payer: Self-pay | Admitting: Orthopedic Surgery

## 2016-07-16 ENCOUNTER — Ambulatory Visit (HOSPITAL_COMMUNITY): Admission: RE | Admit: 2016-07-16 | Payer: Medicare Other | Source: Ambulatory Visit | Admitting: Orthopedic Surgery

## 2016-07-16 ENCOUNTER — Encounter (HOSPITAL_COMMUNITY): Admission: RE | Payer: Self-pay | Source: Ambulatory Visit

## 2016-07-16 SURGERY — SPINAL CORD STIMULATOR BATTERY EXCHANGE
Anesthesia: General

## 2016-08-07 ENCOUNTER — Other Ambulatory Visit: Payer: Self-pay | Admitting: Nephrology

## 2016-08-07 DIAGNOSIS — I159 Secondary hypertension, unspecified: Secondary | ICD-10-CM

## 2016-08-18 ENCOUNTER — Other Ambulatory Visit: Payer: Medicare Other

## 2016-08-19 ENCOUNTER — Other Ambulatory Visit: Payer: Medicare Other

## 2016-09-22 ENCOUNTER — Ambulatory Visit: Payer: Medicare Other

## 2016-09-22 ENCOUNTER — Other Ambulatory Visit: Payer: Self-pay | Admitting: Podiatry

## 2016-09-22 ENCOUNTER — Telehealth: Payer: Self-pay | Admitting: *Deleted

## 2016-09-22 ENCOUNTER — Ambulatory Visit (INDEPENDENT_AMBULATORY_CARE_PROVIDER_SITE_OTHER): Payer: Medicare Other | Admitting: Podiatry

## 2016-09-22 DIAGNOSIS — M79671 Pain in right foot: Secondary | ICD-10-CM

## 2016-09-22 DIAGNOSIS — I70235 Atherosclerosis of native arteries of right leg with ulceration of other part of foot: Secondary | ICD-10-CM

## 2016-09-22 DIAGNOSIS — L97509 Non-pressure chronic ulcer of other part of unspecified foot with unspecified severity: Secondary | ICD-10-CM | POA: Diagnosis not present

## 2016-09-22 DIAGNOSIS — E1143 Type 2 diabetes mellitus with diabetic autonomic (poly)neuropathy: Secondary | ICD-10-CM | POA: Diagnosis not present

## 2016-09-22 DIAGNOSIS — E0843 Diabetes mellitus due to underlying condition with diabetic autonomic (poly)neuropathy: Secondary | ICD-10-CM | POA: Diagnosis not present

## 2016-09-22 DIAGNOSIS — E08621 Diabetes mellitus due to underlying condition with foot ulcer: Secondary | ICD-10-CM | POA: Diagnosis not present

## 2016-09-22 MED ORDER — SULFAMETHOXAZOLE-TRIMETHOPRIM 800-160 MG PO TABS: 1.0000 | ORAL_TABLET | Freq: Two times a day (BID) | ORAL | 0 refills | Status: DC

## 2016-09-22 NOTE — Telephone Encounter (Signed)
-----   Message from Edrick Kins, DPM sent at 09/22/2016  2:33 PM EST ----- Regarding: [place] - Every other day x 4 weeks.  - Patient is home bound.   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 Cleanse with normal saline.  2. Apply prisma collagen dressing. Aquacel Ag.  3. Dress with 2x2 gauze, kling, and lightly wrapped 4" coban dressing to the forefoot.

## 2016-09-22 NOTE — Telephone Encounter (Addendum)
-----   Message from Edrick Kins, DPM sent at 09/22/2016  2:33 PM EST ----- Regarding: [name] - Every other day x 4 weeks.  - Patient is home bound.   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 Cleanse with normal saline.  2. Apply prisma collagen dressing. Aquacel Ag.  3. Dress with 2x2 gauze, kling, and lightly wrapped 4" coban dressing to the forefoot. Dr. Amalia Hailey directed email to Emmit Pomfret - [facility] for home health care. Ovidio Kin - [facility] states pt in network for Home Health care.

## 2016-09-22 NOTE — Progress Notes (Signed)
Subjective:  Patient with a history of diabetes mellitus presents today for evaluation ulceration(s) to the lower extremitie(s). Patient states that he did have a history of diabetes mellitus. Patient presents today for further treatment and evaluation   Objective/Physical Exam General: The patient is alert and oriented x3 in no acute distress.  Dermatology:  Wound #1 noted to the plantar aspect of the first MPJ right foot measuring [phone] cm (LxWxD).   To the noted ulceration(s), there is no eschar. There is a moderate amount of slough, fibrin, and necrotic tissue noted. Granulation tissue and wound base is red. There is a minimal amount of serosanguineous drainage noted. There is no exposed bone muscle-tendon ligament or joint. There is no malodor. Periwound integrity is intact with a mild amount of cellulitis noted encompassing the first metatarsal phalangeal joint.. Skin is warm, dry and supple bilateral lower extremities.  Vascular: Palpable pedal pulses bilaterally. No edema or erythema noted. Capillary refill within normal limits.  Neurological: Epicritic and protective threshold absent bilaterally.   Musculoskeletal Exam: Range of motion within normal limits to all pedal and ankle joints bilateral. Muscle strength 5/5 in all groups bilateral.   Assessment: #1 ulcer first MPJ right foot secondary to diabetes mellitus #2 diabetes mellitus w/ peripheral neuropathy   Plan of Care:  #1 Patient was evaluated. #2 medically necessary excisional debridement including subcutaneous tissue was performed using a tissue nipper and a chisel blade. Excisional debridement of all the necrotic nonviable tissue down to healthy bleeding viable tissue was performed with post-debridement measurements same as pre-. #3 the wound was cleansed and dry sterile dressing applied. #4 today cultures were taken. #5 prescription for Bactrim twice a day 2 weeks #6 Orders for home health dressing changes  placed #7 return to clinic in 2 weeks  Dr. Edrick Kins, [place]

## 2016-09-24 ENCOUNTER — Telehealth: Payer: Self-pay | Admitting: *Deleted

## 2016-09-24 MED ORDER — DOXYCYCLINE HYCLATE 100 MG PO CAPS: 100.0000 mg | ORAL_CAPSULE | Freq: Two times a day (BID) | ORAL | 0 refills | Status: DC

## 2016-09-24 NOTE — Telephone Encounter (Addendum)
Janett Billow - [facility] states pt refused home health care services and states she has sister-in-law that will take care of the wound. Janett Billow states she took 20 minutes talking to the pt discussing the services and pt still refused.Janett Billow states it may be due to the co-pay. Pt called states she is having vomiting and diarrhea from the antibiotic, and would like to know how to take care of the wound on her foot. I spoke with pt and she states she has diarrhea and vomiting all day from the antibiotic.  I told pt to stop the antibiotic and have light meals of white toast, or crackers, light broth not greasy, and ginger ale until the GI problems stopped, no antibiotics all day tomorrow.Dr.Evans ordered Doxycycline 100mg  #28 1 capsule bid, and she could start the Doxycycline Friday. Pt states understanding. I asked pt if it was due to the co-pay and she stated yes. Dr. Amalia Hailey was informed of pt not being able to pay the co-pay and wanting to take care of the right foot at home with family, he ordered clean area daily with mild soap and water, rinse well, pat dry cover with antibiotic ointment and gauze dressing daily report increase pain, redness or streaking, swelling, cloudy and or foul smelling odor as soon as possible. Pt states understanding.

## 2016-09-25 LAB — WOUND CULTURE
Gram Stain: NONE SEEN
Gram Stain: NONE SEEN
Gram Stain: NONE SEEN

## 2016-09-29 ENCOUNTER — Telehealth: Payer: Self-pay | Admitting: *Deleted

## 2016-09-29 NOTE — Telephone Encounter (Signed)
-----   Message from Emmit Pomfret sent at 09/29/2016 12:48 PM EST ----- Just FYI, patient refused home health services.  Her husband and her are providing the wound care and she didn't want the assistance of our nurse.  I explained her supplies were fully covered when receiving home health but she still refused.  Very sweet woman and she has my contact info if she changes her mind after her follow up appointment with you all next week .Marland Kitchen She has been to ER twice in 3 weeks (for unrelated reasons) but would have helped her but she is choosing to refuse at this time.  Thanks! Sharrie Rothman

## 2016-10-06 ENCOUNTER — Ambulatory Visit (INDEPENDENT_AMBULATORY_CARE_PROVIDER_SITE_OTHER): Payer: Medicare Other | Admitting: Podiatry

## 2016-10-06 DIAGNOSIS — L97511 Non-pressure chronic ulcer of other part of right foot limited to breakdown of skin: Secondary | ICD-10-CM | POA: Diagnosis not present

## 2016-10-06 DIAGNOSIS — M79671 Pain in right foot: Secondary | ICD-10-CM | POA: Diagnosis not present

## 2016-10-06 DIAGNOSIS — E1143 Type 2 diabetes mellitus with diabetic autonomic (poly)neuropathy: Secondary | ICD-10-CM

## 2016-10-06 DIAGNOSIS — I70235 Atherosclerosis of native arteries of right leg with ulceration of other part of foot: Secondary | ICD-10-CM

## 2016-10-26 NOTE — Progress Notes (Signed)
Subjective:  Patient with a history of diabetes mellitus presents today for evaluation ulceration(s) to the lower extremitie(s). Patient states that he did have a history of diabetes mellitus. Patient presents today for further treatment and evaluation   Objective/Physical Exam General: The patient is alert and oriented x3 in no acute distress.  Dermatology:  Wound #1 noted to the plantar aspect of the first MPJ right foot measuring [phone] cm (LxWxD).   To the noted ulceration(s), there is no eschar. There is superficial breakdown of the skin noted. Periwound integrity is intact. No drainage noted. No erythema or edema or malodor noted..  Vascular: Palpable pedal pulses bilaterally. No edema or erythema noted. Capillary refill within normal limits.  Neurological: Epicritic and protective threshold absent bilaterally.   Musculoskeletal Exam: Range of motion within normal limits to all pedal and ankle joints bilateral. Muscle strength 5/5 in all groups bilateral.   Assessment: #1 ulcer first MPJ right foot secondary to diabetes mellitus - 95% healed #2 diabetes mellitus w/ peripheral neuropathy   Plan of Care:  #1 Patient was evaluated. #2 medically necessary excisional debridement of the open wound site was performed. Debridement was performed down to the level of superficial wound using a tissue nipper. Wound appears to be approximately 99% healed.  #3 the wound was cleansed and dry sterile dressing applied. #4 today the patient is considered to be healed. Return to clinic when necessary  Dr. Edrick Kins, [place]

## 2016-11-03 ENCOUNTER — Ambulatory Visit (INDEPENDENT_AMBULATORY_CARE_PROVIDER_SITE_OTHER): Payer: Medicare Other | Admitting: Family Medicine

## 2016-11-03 VITALS — BP 194/92 | HR 98 | Temp 98.2°F | Resp 17 | Ht 63.0 in | Wt 178.0 lb

## 2016-11-03 DIAGNOSIS — L03116 Cellulitis of left lower limb: Secondary | ICD-10-CM

## 2016-11-03 DIAGNOSIS — I87302 Chronic venous hypertension (idiopathic) without complications of left lower extremity: Secondary | ICD-10-CM

## 2016-11-03 DIAGNOSIS — R0989 Other specified symptoms and signs involving the circulatory and respiratory systems: Secondary | ICD-10-CM

## 2016-11-03 DIAGNOSIS — R609 Edema, unspecified: Secondary | ICD-10-CM

## 2016-11-03 DIAGNOSIS — I10 Essential (primary) hypertension: Secondary | ICD-10-CM | POA: Diagnosis not present

## 2016-11-03 LAB — POCT CBC
Granulocyte percent: 59 %G (ref 37–80)
HEMATOCRIT: 33.3 % — AB (ref 37.7–47.9)
HEMOGLOBIN: 11.9 g/dL — AB (ref 12.2–16.2)
Lymph, poc: 1.5 (ref 0.6–3.4)
MCH: 32.5 pg — AB (ref 27–31.2)
MCHC: 35.6 g/dL — AB (ref 31.8–35.4)
MCV: 91.1 fL (ref 80–97)
MID (cbc): 0.5 (ref 0–0.9)
MPV: 7.9 fL (ref 0–99.8)
POC GRANULOCYTE: 2.8 (ref 2–6.9)
POC LYMPH PERCENT: 31.4 %L (ref 10–50)
POC MID %: 9.6 % (ref 0–12)
Platelet Count, POC: 122 10*3/uL — AB (ref 142–424)
RBC: 3.66 M/uL — AB (ref 4.04–5.48)
RDW, POC: 13.7 %
WBC: 4.7 10*3/uL (ref 4.6–10.2)

## 2016-11-03 LAB — GLUCOSE, POCT (MANUAL RESULT ENTRY): POC Glucose: 139 mg/dl — AB (ref 70–99)

## 2016-11-03 MED ORDER — FUROSEMIDE 20 MG PO TABS: 40.0000 mg | ORAL_TABLET | Freq: Once | ORAL | Status: DC

## 2016-11-03 MED ORDER — POTASSIUM CHLORIDE CRYS ER 20 MEQ PO TBCR: 20.0000 meq | EXTENDED_RELEASE_TABLET | Freq: Two times a day (BID) | ORAL | 0 refills | Status: DC

## 2016-11-03 MED ORDER — FUROSEMIDE 20 MG PO TABS: ORAL_TABLET | ORAL | 0 refills | Status: DC

## 2016-11-03 MED ORDER — CEPHALEXIN 500 MG PO CAPS
500.0000 mg | ORAL_CAPSULE | Freq: Three times a day (TID) | ORAL | 0 refills | Status: DC
Start: 2016-11-03 — End: 2017-07-03

## 2016-11-03 NOTE — Patient Instructions (Addendum)
Elevate your legs above the level of your heart. Increase protein in your diet and glucerna protein supplements twice a day.  Drink when you are thirsty, do not try to drink tons of water on purpose. Increase your lasix to 2 tabs in the morning and 1 tab with lunch. Take a potassium with morning and lunch. In 1 week, drop back down to your normal dose. If your swelling does not resolve, please come back or follow up with your primary physician. Start taking the antibiotic cephalexin three times a day to treat a skin infection called cellulitis.    IF you received an x-ray today, you will receive an invoice from [facility]. Please contact [facility] at [phone] with questions or concerns regarding your invoice.   IF you received labwork today, you will receive an invoice from [facility]. Please contact [facility] at [phone] with questions or concerns regarding your invoice.   Our billing staff will not be able to assist you with questions regarding bills from these companies.  You will be contacted with the lab results as soon as they are available. The fastest way to get your results is to activate your My Chart account. Instructions are located on the last page of this paperwork. If you have not heard from [place] regarding the results in 2 weeks, please contact this office.      Peripheral Edema Peripheral edema is swelling that is caused by a buildup of fluid. Peripheral edema most often affects the lower legs, ankles, and feet. It can also develop in the arms, hands, and face. The area of the body that has peripheral edema will look swollen. It may also feel heavy or warm. Your clothes may start to feel tight. Pressing on the area may make a temporary dent in your skin. You may not be able to move your arm or leg as much as usual. There are many causes of peripheral edema. It can be a complication of other diseases, such as congestive heart failure, kidney disease, or a  problem with your blood circulation. It also can be a side effect of certain medicines. It often happens to women during pregnancy. Sometimes, the cause is not known. Treating the underlying condition is often the only treatment for peripheral edema. Follow these instructions at home: Pay attention to any changes in your symptoms. Take these actions to help with your discomfort:  Raise (elevate) your legs while you are sitting or lying down.  Move around often to prevent stiffness and to lessen swelling. Do not sit or stand for long periods of time.  Wear support stockings as told by your health care provider.  Follow instructions from your health care provider about limiting salt (sodium) in your diet. Sometimes eating less salt can reduce swelling.  Take over-the-counter and prescription medicines only as told by your health care provider. Your health care provider may prescribe medicine to help your body get rid of excess water (diuretic).  Keep all follow-up visits as told by your health care provider. This is important. Contact a health care provider if:  You have a fever.  Your edema starts suddenly or is getting worse, especially if you are pregnant or have a medical condition.  You have swelling in only one leg.  You have increased swelling and pain in your legs. Get help right away if:  You develop shortness of breath, especially when you are lying down.  You have pain in your chest or abdomen.  You feel weak.  You faint. This information is not intended to replace advice given to you by your health care provider. Make sure you discuss any questions you have with your health care provider. Document Released: 12/11/2004 Document Revised: 04/07/2016 Document Reviewed: 05/16/2015 Elsevier Interactive Patient Education  2017 [employer].

## 2016-11-03 NOTE — Progress Notes (Signed)
Subjective:  By signing my name below, I, [name], attest that this documentation has been prepared under the direction and in the presence of [name], MD.  Electronically Signed: [name], ED Scribe. 11/03/2016. 3:16 PM.   [name], female    DOB: [birth year], 75 y.o.   MRN: [id number]  HPI Chief Complaint  Patient presents with  . Leg Swelling    Since Wednesday     HPI Comments: Sara Graves is a 75 y.o. female who presents to the [facility] complaining of leg swelling onset 5 days. Pt initially noticed swelling in feet bilaterally 2 weeks ago. Pt usually takes lasix on and off, but began taking it more consitently after noticing swelling 2 weeks ago. Swelling worsening 5 days ago now present in bilateral legs. Pt tends to be on her feet all day.  Pt has an extensive medical hx including Insulin dependent diabetes, HTN, anxiety. She is on lasix, metoprolol 50mg , and divan HCTZ for her BP. Pt reports BP fluctuates. She checks BP at home which usually run 120-140. Pt has seen Dr. [name] in the past, about 3 months ago after her stimulator was placed. At that time she had a cardiac ultrasound which she states was normal. Pt reports blood sugars range 60-110. She has an appointment with her endocrinologist in 2 days. Pt does not consume much protein. She drinks protein drinks when she can. She denies SOB when lying flat, CP, light headiness, dizziness.     Patient Active Problem List   Diagnosis Date Noted  . Septic joint of left knee joint ([place]) 11/02/2015  . Abdominal pain   . Vomiting 03/24/2015  . Essential hypertension 03/24/2015  . Thrombocytopenia ([place]) 08/31/2014  . Hypokalemia 08/30/2014  . Intractable nausea and vomiting 08/29/2014  . Protein-calorie malnutrition, severe ([place]) 05/16/2014  . Hyperkalemia 05/15/2014  . Unspecified vitamin D deficiency 05/09/2014  . Nausea alone 05/08/2014  . Osteopenia 04/06/2014  . Neuropathy  of leg 04/27/2013  . Weight loss, abnormal 03/16/2013  . Left rotator cuff tear 12/29/2012  . Baker's cyst 04/09/2012  . Diabetic neuropathy ([place]) 09/16/2011  . UNSPECIFIED VENOUS INSUFFICIENCY 09/20/2010  . LACTOSE INTOLERANCE 01/23/2009  . Depression 02/04/2007  . Hypothyroidism 01/14/2007  . DM (diabetes mellitus), type 2, uncontrolled ([place]) 01/14/2007  . HYPERCHOLESTEROLEMIA 01/14/2007  . HYPERTENSION, BENIGN SYSTEMIC 01/14/2007  . RHINITIS, ALLERGIC 01/14/2007  . ARTHRITIS 01/14/2007  . SCIATICA 01/14/2007   Past Medical History:  Diagnosis Date  . Anxiety   . Arthritis   . Basal cell carcinoma of nose    removed  . Bursitis of left shoulder   . Cat scratch fever    Late 90s  . Depression   . Diabetes mellitus, type II, insulin dependent ([place])   . GERD (gastroesophageal reflux disease)   . H. pylori infection 2008 and 1998   treated  . Hx of colonoscopy with polypectomy   . Hyperlipidemia   . Hypertension   . Hypothyroidism   . Multinodular goiter   . Pneumonia    hx of  . Rotator cuff tear, left recurrent   . Urge urinary incontinence   . UTI (lower urinary tract infection) 05/2016   Past Surgical History:  Procedure Laterality Date  . APPENDECTOMY  AGE 89  . BILATERAL BUNIONECTOMIES    . BLADDER NECK SUSPENSION  1970'S  . COLONOSCOPY W/ POLYPECTOMY    . ESOPHAGOGASTRODUODENOSCOPY ENDOSCOPY    . EXCISION GANGLION CYST  LEFT RING FINGER  01-17-2009  . FORAMINAL DECOMPRESSION AT L2 TO THE SACRUM  01-05-2008   L2  -  S1  . HAMMERTOE REPAIR     BILATERAL  . IMPLANTATION PERMANENT SPINAL CORD STIMULATOR  06-15-2008   JUNE 2013--  BATTERY CHANGE  . LIPOMA EXCISION     RIGHT ELBOW  . ROTATOR CUFF REPAIR  03-28-2003   RIGHT SHOULDER  DEGENERATIVE AC JOINT AND RC TEAR  . SHOULDER OPEN ROTATOR CUFF REPAIR  09/07/2012   Procedure: ROTATOR CUFF REPAIR SHOULDER OPEN;  Surgeon: Magnus Sinning, MD;  Location: [facility];  Service: Orthopedics;   Laterality: Left;  OPEN ANTERIOR ACROMIONECTOMY AND ROTATOR CUFF REPAIR ON LEFT   . SHOULDER OPEN ROTATOR CUFF REPAIR Left 12/28/2012   Procedure: ROTATOR CUFF REPAIR SHOULDER OPEN;  Surgeon: Magnus Sinning, MD;  Location: [facility];  Service: Orthopedics;  Laterality: Left;  OPEN SHOULDER ROTATOR CUFF REPAIR ON LEFT  WITH ANTERIOR ACROMINECTOMY   . SPINAL CORD STIMULATOR BATTERY EXCHANGE N/A 07/03/2016   Procedure: SPINAL CORD STIMULATOR BATTERY PLACMENT;  Surgeon: Melina Schools, MD;  Location: [facility];  Service: Orthopedics;  Laterality: N/A;  . TONSILLECTOMY  AGE 33  . 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 VAGINAL HYSTERECTOMY  1970'S   Allergies  Allergen Reactions  . Rosiglitazone Maleate Anaphylaxis and Swelling      (ANTIDIABETIC MED.) All over body   . Morphine Other (See Comments)    SEVERE HYPOTENSION   . Sulfa Antibiotics     Diarrhea, nausea and vomiting.  [name] [Amitriptyline] Nausea Only  . Tramadol-Acetaminophen Rash   Prior to Admission medications   Medication Sig Start Date End Date Taking? Authorizing Provider  aspirin (BAYER CHILDRENS ASPIRIN) 81 MG chewable tablet Chew 81 mg by mouth daily.    Yes Historical Provider, MD  busPIRone (BUSPAR) 7.5 MG tablet Take 7.5 mg by mouth 2 (two) times daily.   Yes Historical Provider, MD  Calcium Carb-Cholecalciferol (CALCIUM 600 + D PO) Take 1 tablet by mouth daily.   Yes Historical Provider, MD  cephALEXin (KEFLEX) 500 MG capsule Take 500 mg by mouth 2 (two) times daily.   Yes Historical Provider, MD  doxycycline (VIBRAMYCIN) 100 MG capsule Take 1 capsule (100 mg total) by mouth 2 (two) times daily. 09/24/16  Yes Edrick Kins, DPM  FLUoxetine (PROZAC) 20 MG capsule Take 20 mg by mouth daily.   Yes Historical Provider, MD  furosemide (LASIX) 20 MG tablet TAKE ONE TABLET BY MOUTH IN THE MORNING AS NEEDED FOR SWELLING 10/30/15  Yes Historical Provider, MD  HYDROcodone-acetaminophen (NORCO)  5-325 MG tablet Take 1 tablet by mouth every 4 (four) hours as needed. 07/03/16  Yes Melina Schools, MD  Insulin Glargine (TOUJEO SOLOSTAR) 300 UNIT/ML SOPN Inject 32 Units into the skin daily with breakfast.    Yes Historical Provider, MD  levothyroxine (SYNTHROID, LEVOTHROID) 50 MCG tablet Take 50 mcg by mouth every morning.  08/24/12  Yes Clovis Cao, MD  LORazepam (ATIVAN) 0.5 MG tablet Take 0.5 mg by mouth at bedtime.   Yes Historical Provider, MD  metoprolol succinate (TOPROL-XL) 50 MG 24 hr tablet Take 50 mg by mouth daily. Take with or immediately following a meal.   Yes Historical Provider, MD  Omega-3 Fatty Acids (FISH OIL) 1000 MG CAPS Take 1,000 mg by mouth 2 (two) times daily. OTC fish oil   Yes Historical Provider, MD  ondansetron [facility])  4 MG tablet Take 1 tablet (4 mg total) by mouth every 8 (eight) hours as needed for nausea or vomiting. 07/03/16  Yes Melina Schools, MD  pantoprazole (PROTONIX) 40 MG tablet Take 1 tablet (40 mg total) by mouth daily. Switch for any other PPI at similar dose and frequency Patient taking differently: Take 40 mg by mouth daily as needed (heartburn and indigestion).  03/26/15  Yes Thurnell Lose, MD  ranitidine (ZANTAC) 150 MG tablet Take 150 mg by mouth 2 (two) times daily.   Yes Historical Provider, MD  rosuvastatin (CRESTOR) 10 MG tablet Take 1 tablet by mouth daily. 04/17/16  Yes Historical Provider, MD  scopolamine (TRANSDERM-SCOP) 1 MG/3DAYS Place 1 patch onto the skin every 3 (three) days.   Yes Historical Provider, MD  sulfamethoxazole-trimethoprim (BACTRIM DS,SEPTRA DS) 800-160 MG tablet Take 1 tablet by mouth 2 (two) times daily. 09/22/16  Yes Edrick Kins, DPM  valsartan-hydrochlorothiazide (DIOVAN-HCT) 320-12.5 MG tablet Take 1 tablet by mouth daily.   Yes Historical Provider, MD   Social History   Social History  . Marital status: Married    Spouse name: N/A  . Number of children: 2  . Years of education: 11th   Occupational History  .  Retired    Social History Main Topics  . Smoking status: Never Smoker  . Smokeless tobacco: Never Used  . Alcohol use No  . Drug use: No  . Sexual activity: Not on file   Other Topics Concern  . Not on file   Social History Narrative   Pt lives with her husband and 1 grown son in [place]. Younger son has muscular dystrophy. Having a difficult time as primary caregiver to son who is not doing well.  Declined respite care/placement.   Caffeine Use: none; very little    Review of Systems  Constitutional: Negative for chills, diaphoresis and fever.  Respiratory: Negative for chest tightness and shortness of breath.   Cardiovascular: Positive for leg swelling. Negative for chest pain and palpitations.  Musculoskeletal: Positive for joint swelling and myalgias. Negative for gait problem.  Skin: Positive for color change. Negative for pallor, rash and wound.  Neurological: Negative for dizziness, weakness, light-headedness and numbness.  Psychiatric/Behavioral: Negative for agitation, behavioral problems and sleep disturbance. The patient is not nervous/anxious.        Objective:   Physical Exam  Constitutional: She is oriented to person, place, and time. She appears well-developed and well-nourished. No distress.  HENT:  Head: Normocephalic and atraumatic.  Eyes: Conjunctivae and EOM are normal.  Neck: Neck supple. No tracheal deviation present.  Cardiovascular: Normal rate, regular rhythm and normal heart sounds.   Pulmonary/Chest: Effort normal. No respiratory distress. She has no wheezes. She has no rales.  Musculoskeletal: Normal range of motion.  Mild erythema. no significant warmth. Left lower extremity medial aspect with more marked erythema and warmth Positive changes of pad of hairless skin mildly hyperpigmentation and dry with mild to moderate varicosity.  3+ pitting edema on left 1+ on right   Leg measurement   On right Upper calf - 16.75 cm  Lower 13.25 cm  On  left 17 cm 14 cm    Neurological: She is alert and oriented to person, place, and time.  Skin: Skin is warm and dry.  Psychiatric: She has a normal mood and affect. Her behavior is normal.  Nursing note and vitals reviewed.  Vitals:   11/03/16 1504  BP: (!) 194/92  Pulse: 98  Resp: 17  Temp: 98.2 F (36.8 C)  TempSrc: Oral  SpO2: 94%  Weight: 178 lb (80.7 kg)  Height: 5\' 3"  (1.6 m)    Results for orders placed or performed in visit on 11/03/16  POCT CBC  Result Value Ref Range   WBC 4.7 4.6 - 10.2 K/uL   Lymph, poc 1.5 0.6 - 3.4   POC LYMPH PERCENT 31.4 10 - 50 %L   MID (cbc) 0.5 0 - 0.9   POC MID % 9.6 0 - 12 %M   POC Granulocyte 2.8 2 - 6.9   Granulocyte percent 59.0 37 - 80 %G   RBC 3.66 (A) 4.04 - 5.48 M/uL   Hemoglobin 11.9 (A) 12.2 - 16.2 g/dL   HCT, POC 33.3 (A) 37.7 - 47.9 %   MCV 91.1 80 - 97 fL   MCH, POC 32.5 (A) 27 - 31.2 pg   MCHC 35.6 (A) 31.8 - 35.4 g/dL   RDW, POC 13.7 %   Platelet Count, POC 122 (A) 142 - 424 K/uL   MPV 7.9 0 - 99.8 fL  POCT glucose (manual entry)  Result Value Ref Range   POC Glucose 139 (A) 70 - 99 mg/dl   EKG- normal sinus rhythm, Question of left atrial enlargement. No ischemic change.  Assessment & Plan:  thrombocytopenia is improved from prior 4 months ago. HGB stable. EKG is unchanged from 8/11/ 2017. Her weight is only 4 lbs up from prior, 4 months ago.    1. Peripheral edema   2. Labile hypertension   3. Stasis edema of left lower extremity   4. Cellulitis of left lower extremity    Patient and her son are both very concerned about a left lower extremity DVT. I think there is relatively low risk however her left leg is certainly more swollen than her right so will proceed with stat Doppler which did in fact rule out DVT.  I am concerned that we are seeing the beginning of a left lower extremity cellulitis just proximal to the medial malleolus. This area certainly has increased erythema and warmth with some mild  tenderness compared with other areas on the left leg and to the right leg. Fortunately it is mild and appears to likely just be beginning. Cover with Keflex. Advise close monitoring with rapid follow-up if worsening at all.   Increase Lasix from 20 every morning to 40 every morning and 20 with lunch. Increase potassium from daily to twice a day. Decrease back to baseline dose when edema has resolved. If it has not improved in several days, needs follow-up for repeat eval and labs.  Importance of elevating ankles above the groin as much as possible and at least half of the day reinforced.  Orders Placed This Encounter  Procedures  . Comprehensive metabolic panel  . Brain natriuretic peptide  . POCT CBC  . POCT glucose (manual entry)  . EKG 12-Lead    Meds ordered this encounter  Medications  . furosemide (LASIX) tablet 40 mg  . cephALEXin (KEFLEX) 500 MG capsule    Sig: Take 1 capsule (500 mg total) by mouth 3 (three) times daily.    Dispense:  21 capsule    Refill:  0  . furosemide (LASIX) 20 MG tablet    Sig: Take 2 tabs po qam and 1 tab po qlunch    Dispense:  90 tablet    Refill:  0  . potassium chloride SA (K-DUR,KLOR-CON) 20 MEQ tablet    Sig: Take 1  tablet (20 mEq total) by mouth 2 (two) times daily. With lasix    Dispense:  60 tablet    Refill:  0    I personally performed the services described in this documentation, which was scribed in my presence. The recorded information has been reviewed and considered, and addended by me as needed.   [name], M.D.  [facility] [address] [place], [place] [address] [phone] phone [phone] fax  11/04/16 10:02 PM

## 2016-11-04 ENCOUNTER — Ambulatory Visit (HOSPITAL_COMMUNITY)
Admission: RE | Admit: 2016-11-04 | Discharge: 2016-11-04 | Disposition: A | Discharge status: Home / Self Care | Payer: Medicare Other | Source: Ambulatory Visit | Attending: Family Medicine | Admitting: Family Medicine

## 2016-11-04 DIAGNOSIS — I87302 Chronic venous hypertension (idiopathic) without complications of left lower extremity: Secondary | ICD-10-CM | POA: Diagnosis not present

## 2016-11-04 LAB — COMPREHENSIVE METABOLIC PANEL
ALT: 9 IU/L (ref 0–32)
AST: 11 IU/L (ref 0–40)
Albumin/Globulin Ratio: 2 (ref 1.2–2.2)
Albumin: 4.3 g/dL (ref 3.5–4.8)
Alkaline Phosphatase: 69 IU/L (ref 39–117)
BUN/Creatinine Ratio: 29 — ABNORMAL HIGH (ref 12–28)
BUN: 24 mg/dL (ref 8–27)
Bilirubin Total: 0.4 mg/dL (ref 0.0–1.2)
CALCIUM: 9.4 mg/dL (ref 8.7–10.3)
CO2: 24 mmol/L (ref 18–29)
CREATININE: 0.82 mg/dL (ref 0.57–1.00)
Chloride: 102 mmol/L (ref 96–106)
GFR, EST AFRICAN AMERICAN: 81 mL/min/{1.73_m2} (ref >59)
GFR, EST NON AFRICAN AMERICAN: 70 mL/min/{1.73_m2} (ref >59)
GLOBULIN, TOTAL: 2.1 g/dL (ref 1.5–4.5)
Glucose: 143 mg/dL — ABNORMAL HIGH (ref 65–99)
Potassium: 4.1 mmol/L (ref 3.5–5.2)
SODIUM: 142 mmol/L (ref 134–144)
TOTAL PROTEIN: 6.4 g/dL (ref 6.0–8.5)

## 2016-11-04 LAB — BRAIN NATRIURETIC PEPTIDE: BNP: 111.6 pg/mL — ABNORMAL HIGH (ref 0.0–100.0)

## 2016-11-04 NOTE — Progress Notes (Signed)
VASCULAR LAB PRELIMINARY  PRELIMINARY  PRELIMINARY  PRELIMINARY  Left lower extremity venous duplex completed.    Preliminary report:  Left - No evidence of DVT or superficial thrombosis. Positive for a Baker's cyst.  Analisa Sledd, RVS 11/04/2016, 11:10 AM

## 2016-11-12 ENCOUNTER — Encounter: Payer: Self-pay | Admitting: *Deleted

## 2016-11-30 ENCOUNTER — Other Ambulatory Visit: Payer: Self-pay | Admitting: Family Medicine

## 2016-12-03 NOTE — Telephone Encounter (Signed)
11/04/16 last ov and lab

## 2016-12-04 NOTE — Telephone Encounter (Signed)
Pt needs to follow-up with her PCP any for additional refills - please call and make sure she is aware of that - she has a nephrology appt on 1/24 - I'm sure they would be happy to rx the lasix and K for her.

## 2016-12-08 NOTE — Telephone Encounter (Signed)
Pt advised.

## 2017-04-23 IMAGING — CT CT ABD-PELV W/ CM
2 of 5 series · 17 of 46 positions shown, 19 images · IV contrast (OMNIPAQUE)
Comparison: 05/16/2014.

CLINICAL DATA: Epigastric pain with nausea vomiting for 4 days.

EXAM:
CT ABDOMEN AND PELVIS WITH CONTRAST
TECHNIQUE: Multidetector CT imaging of the abdomen and pelvis was performed
using the standard protocol following bolus administration of
intravenous contrast.
CONTRAST:  50mL OMNIPAQUE IOHEXOL 300 MG/ML SOLN, 100mL OMNIPAQUE
IOHEXOL 300 MG/ML SOLN

[Series 2: rtn a/p with · axial · 0.79mm/px · z∈[-546,-196]mm · 14 of 80 slices shown, 16 images]
[im 5/80  soft-tissue]
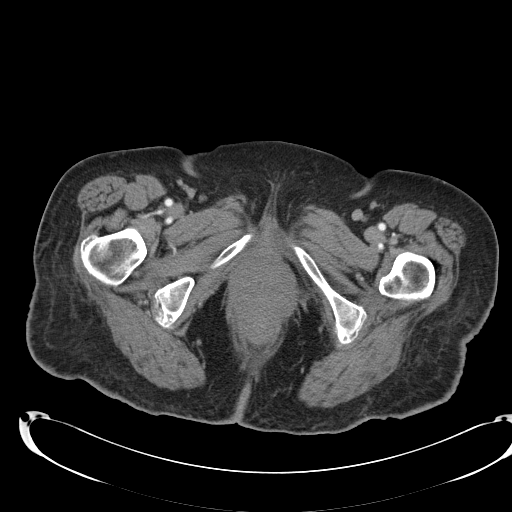
[im 5/80  bone]
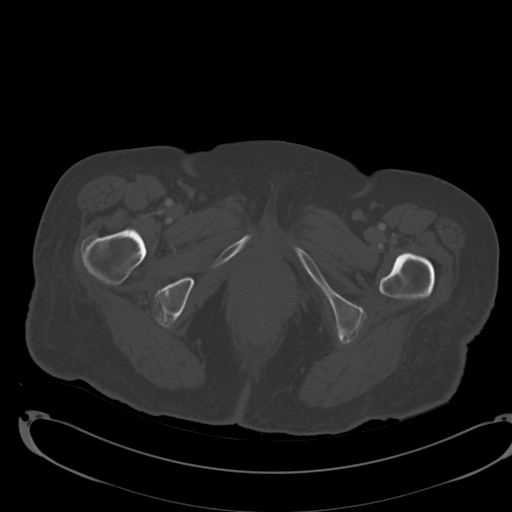
[im 9/80  soft-tissue]
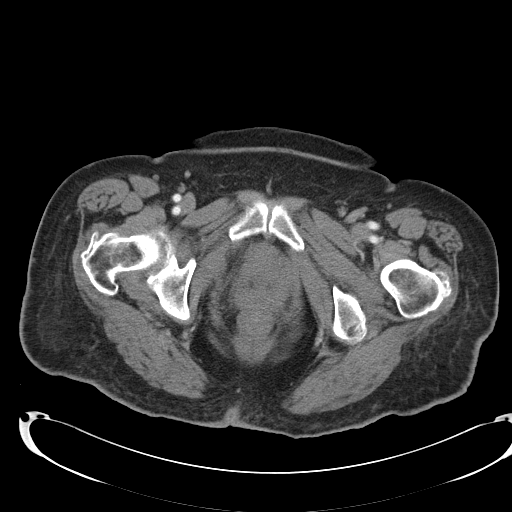
[im 18/80  soft-tissue]
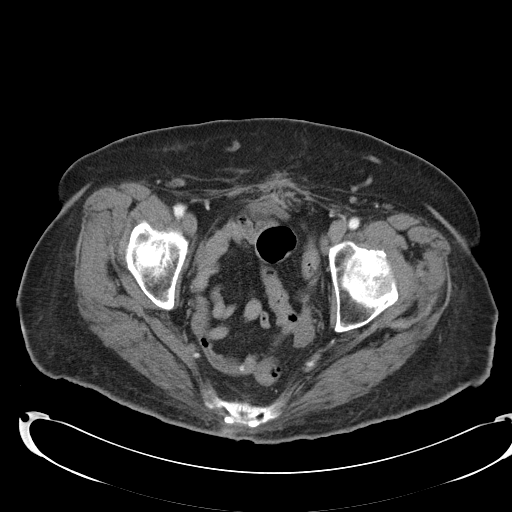
[im 22/80  soft-tissue]
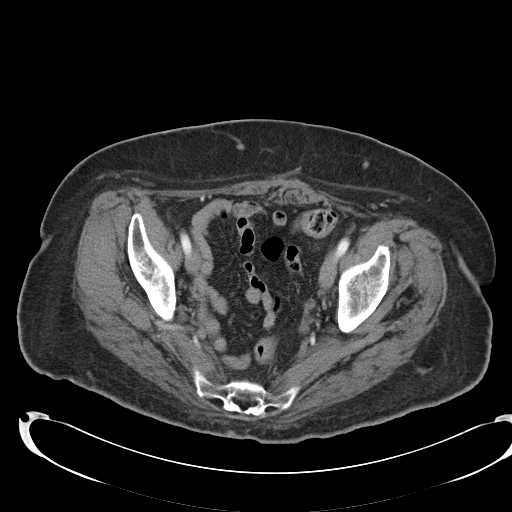
[im 27/80  soft-tissue]
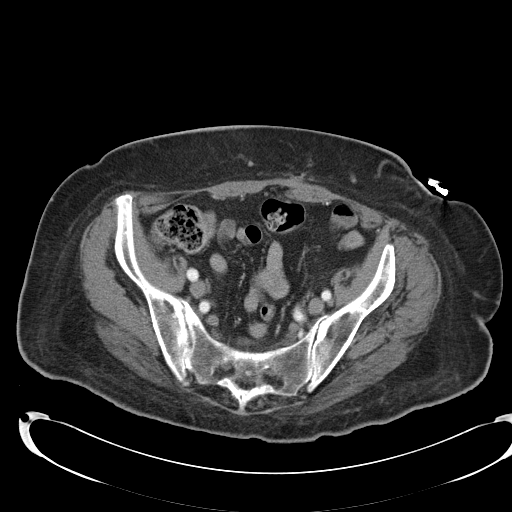
[im 31/80  soft-tissue]
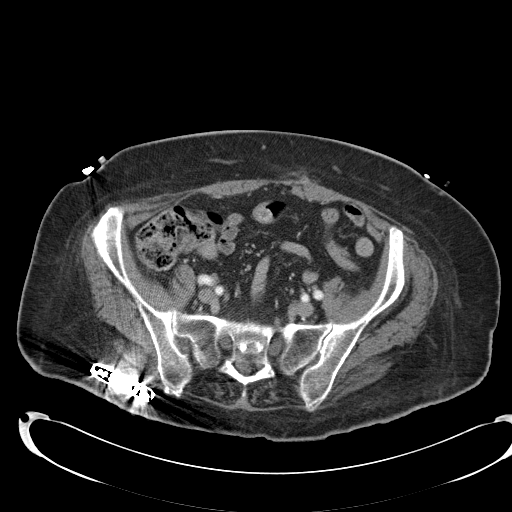
[im 36/80  soft-tissue]
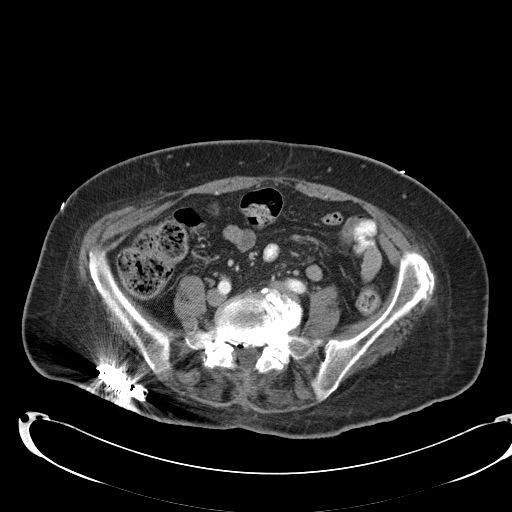
[im 44/80  soft-tissue]
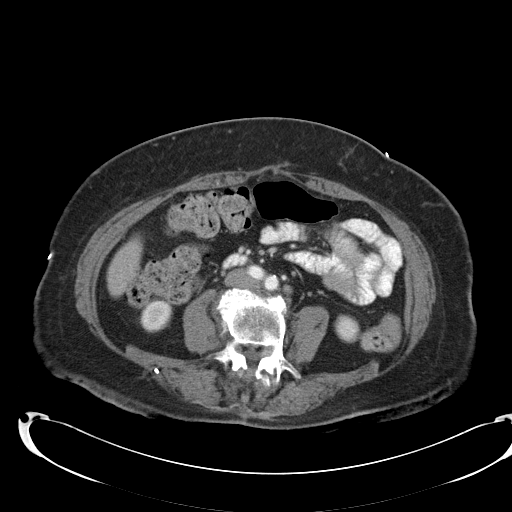
[im 49/80  soft-tissue]
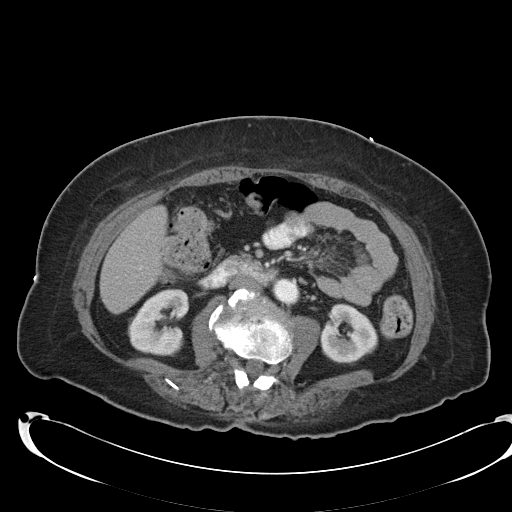
[im 49/80  bone]
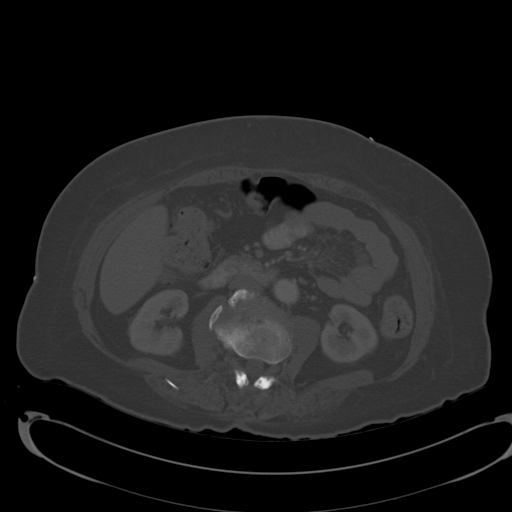
[im 53/80  soft-tissue]
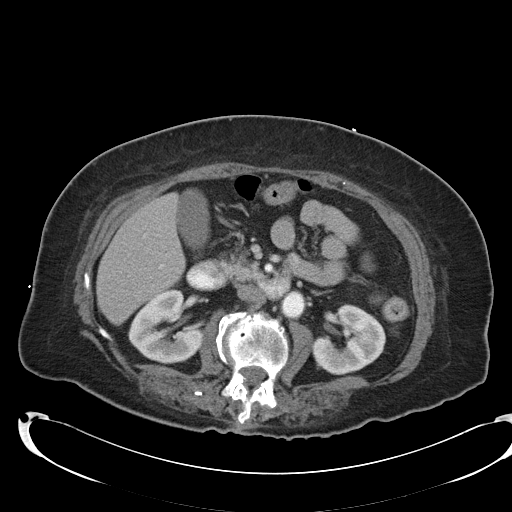
[im 58/80  soft-tissue]
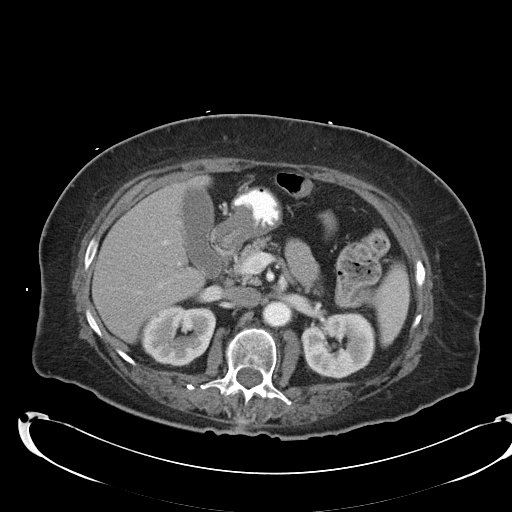
[im 62/80  soft-tissue]
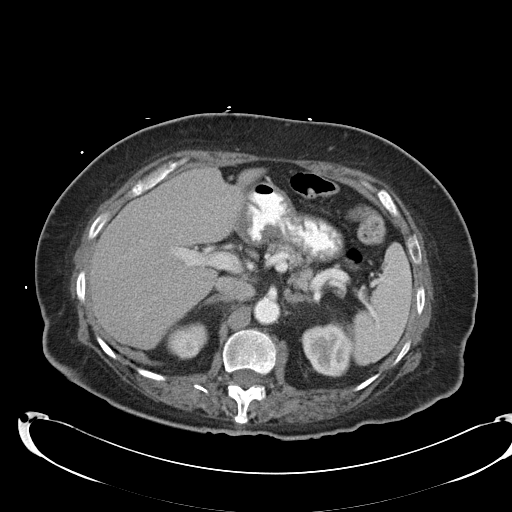
[im 71/80  soft-tissue]
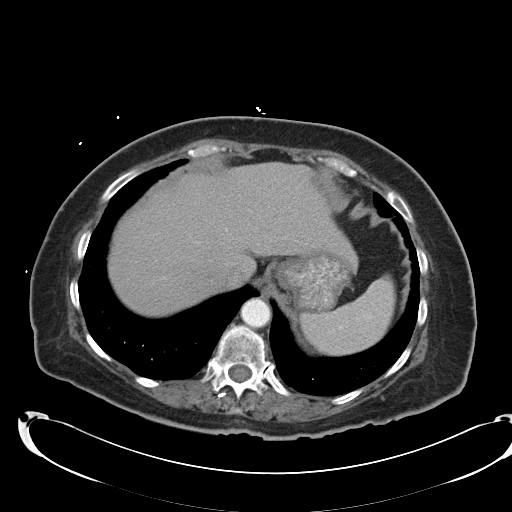
[im 75/80  soft-tissue]
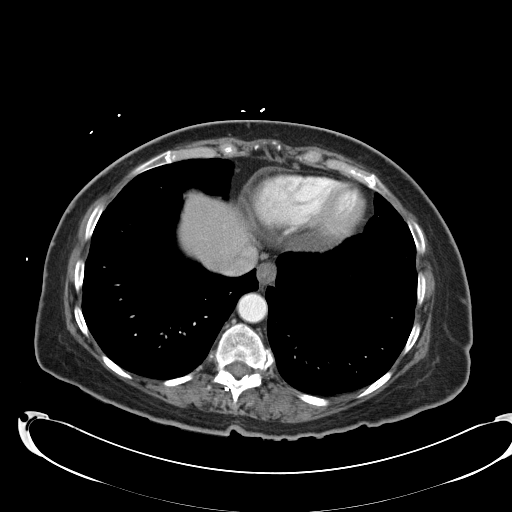

[Series 5: coronal · coronal · 0.81mm/px · 3 of 130 slices shown]
[im 44/130  soft-tissue]
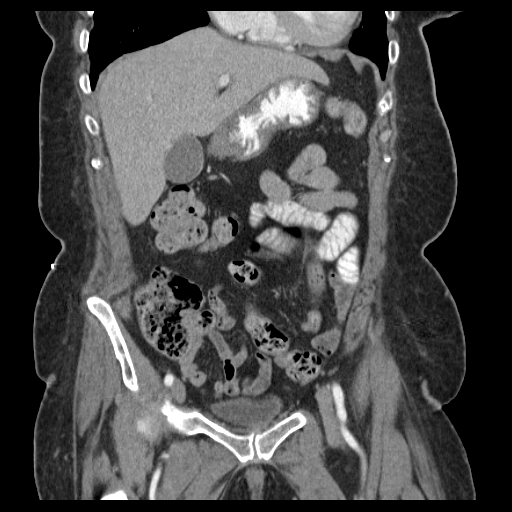
[im 58/130  soft-tissue]
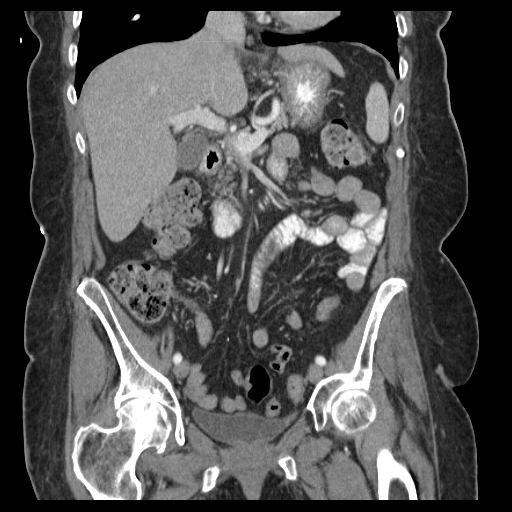
[im 72/130  soft-tissue]
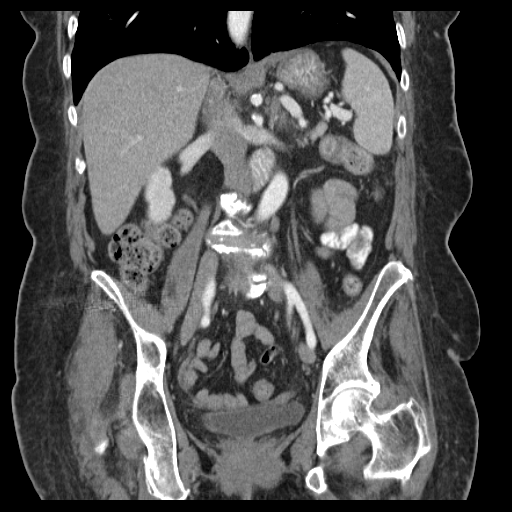

[17 of 46 positions shown; findings below may reference images not displayed]

FINDINGS: No lung base consolidation or nodule.  Heart is normal in size.

Small calcifications in the liver, stable consistent with healed
granulomas. Liver otherwise unremarkable.

Normal spleen, gallbladder and pancreas. No bile duct dilation. No
adrenal masses.

Normal kidneys, ureters and bladder.

Uterus is surgically absent.  No pelvic masses.

No pathologically enlarged lymph nodes. No abnormal fluid
collections.

There is calcification along the aorta, most notable at the origin
of the left renal artery. No aneurysm.

Colon and small bowel are unremarkable.

There has been previous lumbar surgery with low lumbar spine
laminectomies. Degenerative changes are noted throughout the
visualized spine. No osteoblastic or osteolytic lesions.
IMPRESSION: 1. No acute findings.  No findings to explain epigastric pain.
2. Status post hysterectomy and changes from lumbar spine surgery as
well as prominent degenerative changes of the lumbar spine.

## 2017-05-17 HISTORY — PX: CARPAL TUNNEL RELEASE: SHX101

## 2017-07-03 ENCOUNTER — Ambulatory Visit (INDEPENDENT_AMBULATORY_CARE_PROVIDER_SITE_OTHER): Payer: Medicare Other

## 2017-07-03 ENCOUNTER — Ambulatory Visit (INDEPENDENT_AMBULATORY_CARE_PROVIDER_SITE_OTHER): Payer: Medicare Other | Admitting: Podiatry

## 2017-07-03 ENCOUNTER — Other Ambulatory Visit: Payer: Self-pay | Admitting: Podiatry

## 2017-07-03 ENCOUNTER — Encounter: Payer: Self-pay | Admitting: Podiatry

## 2017-07-03 DIAGNOSIS — L84 Corns and callosities: Secondary | ICD-10-CM

## 2017-07-03 DIAGNOSIS — M79672 Pain in left foot: Secondary | ICD-10-CM

## 2017-07-03 NOTE — Progress Notes (Signed)
Subjective:    [name], female   DOB: 76 y.o.   MRN: [id number]   HPI patient presents with a area of irritation underneath the left foot and is concerned that it could be something that happened or be an ulcer and also traumatized the top of her left foot and is concerned about fracture    ROS      Objective:  Physical Exam neurovascular status intact muscle strength adequate with patient having 2 separate problems with one being large lesion plantar aspect left third metatarsal that's localized in nature with no erythema edema or active drainage and secondarily edema to the top of the left foot secondary to trauma     Assessment:    Probability for corn callus formation plantar left with sub-bleeding which created discoloration and traumatized left foot with possibility for fracture     Plan:   H&P condition reviewed and at this point I went ahead and I debrided the lesion fully plantar left. There was no active drainage and it appears to be large callus formation and he gave her relief. I then reviewed her x-rays and do not recommend further treatment  X-ray report indicates that there is no signs of fracture or other bone pathology

## 2017-12-27 DIAGNOSIS — R0609 Other forms of dyspnea: Secondary | ICD-10-CM

## 2017-12-27 DIAGNOSIS — R0602 Shortness of breath: Secondary | ICD-10-CM | POA: Diagnosis present

## 2017-12-27 NOTE — H&P (Signed)
OFFICE VISIT NOTES COPIED TO EPIC FOR DOCUMENTATION  . History of Present Illness Sara Maine FNP-C; 12/15/2017 2:41 PM) Patient words: Last OV 10/13/2017; 2 month FU for sob, leg edema, fatigue.  The patient is a 77 year old female who presents for a Follow-up for Edema of lower extremities.  Additional reasons for visit:  Follow-up for Shortness of breath is described as the following: She has history of difficult to control hypertension, hyperlipidemia, uncontrolled diabetes mellitus, but recently better controlled on insulin. She has sedentary lifestyle due to chronic back pain and has stimulator in situ. 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  Underwent Echocardiogram in Feb 2018 which showed mild to moderate decrease in LV systolic function with possible inferolateral hypokinesis. She is also noted to have grade 1 diastolic dysfunction. LVEF is estimated at 40-45%. Lexican stress in Feb 2018 revealed moderate area of infarction in the basal inferior, basal inferolateral, mid inferior, mid inferolateral and apical inferior myocardial wall(s). The left ventricular ejection fraction was calculated or visually estimated to be 33%.  Patient now presents for 3 month follow up visit for dyspnea on exertion, fatigue, and leg edema. Leg edema has continued despite avoiding salt and avoiding eating out. Torsemide was also increased without improvement in leg edema. She feels that her dyspnea on exertion and fatigue has worsened since last seen by [place]. Reports having to stop in rest with walking into our office or walking at the grocery store. No exertional chest pain. Tolerating medications well.  Her chronically ill and disabled son continues to live with her and her husband.   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 Moderately abnormal waveforms of the right ankle. Severely abnormal waveforms of the left ankle. Edema of both legs (R60.0)  Chronic diastolic HF (heart failure) (I50.32)  Echocardiogram 12/23/2016: Left ventricle cavity is mildly dilated. Mild concentric hypertrophy of the left ventricle. Diffuse hypokinesis. Mild to moderate decrease in LV systolic function. Cannot exclude inferolateral hypokinesis. Poor apical views. Estimated LVEF 40-45% Doppler evidence of grade I (impaired) diastolic dysfunction, elevated LAP. Left atrial cavity is mildly dilated at 4 cm, but moderate to severely dilated by volume. Trace aortic regurgitation. Mild (Grade I) mitral regurgitation. Mild to moderate tricuspid regurgitation. Borderline mild pulmonary hypertension. Pulmonary artery systolic pressure is estimated at 29 mm Hg. Labwork  08/31/2017: Cholesterol 124, triglycerides 67, HDL 55, LDL 56. RBC 3.59, hemoglobin normal, hematocrit 33.9, platelets 129, CBC normal. Creatinine 0.96, EGFR 58/66, potassium 4.0, sodium 146, CMP otherwise normal. Labs 03/18/2017: Total cholesterol 212, triglycerides 86, HDL 59, LDL 136. Serum glucose 83 mg, BUN 24, creatinine 0.66, eGFR 87 mL. Potassium 3.5. Labs 10/05/2016: Serum creatinine 0.85, BUN 18, potassium 4.4, CMP otherwise normal. TSH normal, total cholesterol 210, triglycerides 199, HDL 57, LDL 113. Non-HDL cholesterol 153. CBC normal, platelet count mildly reduced at 133. 04/15/2016: Creatinine 0.74, potassium 4.1, CMP normal, total cholesterol 210, triglycerides 101, HDL 60, LDL 130, non-HDL 150 02/21/2016: CBC normal, TSH 1.42, ANA  negative Coronary arteriosclerosis (I25.10)  Lexiscan myoview stress test 01/09/2017: 1. The resting electrocardiogram demonstrated  normal sinus rhythm, normal resting conduction, no resting arrhythmias and normal rest repolarization. Stress EKG is non-diagnostic for ischemia as it a pharmacologic stress using Lexiscan. 2. There is a moderate area of infarction in the basal inferior, basal inferolateral, mid inferior, mid inferolateral and apical inferior myocardial wall(s). The left ventricular ejection fraction was calculated or visually estimated to be 33%. Diminished pulses in lower extremity (R09.89)  ABI 01/22/2017: This exam reveals mildly decreased perfusion of the right lower extremity, with RABI 0.92 and moderately decreased perfusion of the left lower extremity, with LABI 0.67 noted at the post tibial artery level. Moderately abnormal waveforms of the right ankle. Severely abnormal waveforms of the left ankle. Run of atrial premature complexes (I49.1)  Thrombocytopenia (D69.6)   Allergies Anderson Malta Sergeant; 12-17-17 2:07 PM) Morphine Sulfate (Concentrate) *ANALGESICS - OPIOID*  Difficulty breathing. Avandia *ANTIDIABETICS*  TraMADol HCl *ANALGESICS - OPIOID*  Rash. Amitriptyline HCl *ANTIDEPRESSANTS*  Rash.  Family History Anderson Malta Sergeant; 2017-12-17 2:07 PM) Mother  Deceased. at age 83 from Natural Causes; Dementia; CHF Father  Deceased. in his 71's from Prostate Cancer; No known Heart conditions Sister 2  1-Deceased as a Sport and exercise psychologist; 1-older, has Stents - MI in her 20's Brother 1  Deceased. at [facility]  Social History Anderson Malta Sergeant; 12-17-17 2:07 PM) Current tobacco use  Never smoker. Non Drinker/No Alcohol Use  Marital status  Married. Number of Children  2. Living Situation  Lives with spouse.  Past Surgical History Anderson Malta Sergeant; 12/17/17 2:07 PM) Rotator Cuff Repair - Right  Appendectomy [1958]: Knee Replacement, Total [1998]: Right. Stimulator  Placed in [place] back [2000]: Battery Replaced for Spinal Stent [05/2016]:  Medication History Sara Maine, FNP-C; 12-17-2017 2:41 PM) Isosorbide Mononitrate ER (60MG Tablet ER 24HR, 1 (one) Tablet Oral daily, Taken starting 10/13/2017) Active. Torsemide (20MG Tablet, 1 Tablet Oral daily, Taken starting 08/31/2017) Active. Olmesartan Medoxomil-HCTZ (40-25MG Tablet, 2 (one) Tablet Tablet Tablet Oral every morning, Taken starting 08/09/2017) Active. Potassium Chloride ER (20MEQ Tablet ER, 1 (one) Tablet Oral daily with Torsemide as directed, Taken starting 05/06/2017) Active. Ezetimibe (10MG Tablet, 1 (one) Tablet Oral every evening after dinner, Taken starting 05/04/2017) Active. Labetalol HCl (300MG Tablet, 1 (one) Tablet Tablet Tablet T Oral two times daily, Taken starting 03/25/2017) Active. Rosuvastatin Calcium (20MG Tablet, 1 Tablet Tablet Tablet Tablet Oral daily, Taken starting 05/07/2016) Active. Calcium Carbonate (600MG Tablet, 1 Oral daily) Active. Vitamin D (2000UNIT Tablet, 1 Oral daily) Active. Aspirin EC Low Strength (81MG Tablet DR, 1 Oral daily) Active. Spironolactone (50MG Tablet, 1 Oral daily) Active. Levothyroxine Sodium (50MCG Tablet, 1 Oral daily) Active. HumaLOG KwikPen (100UNIT/ML Soln Pen-inj, inject 6 units Subcutaneous three times daily) Active. BusPIRone HCl (7.5MG Tablet, 1 Oral daily) Active. Fish Oil (1000MG Capsule, 1 Oral daily) Active. Allergy (1 Oral as needed) Specific strength unknown - Active. Metoclopramide HCl (10MG Tablet, 1 Oral three times daily) Active. Omeprazole (40MG Capsule DR, 1 Oral two times daily) Active. V-Go 20 Active. Medications Reconciled (verbally)  Diagnostic Studies History Anderson Malta Sergeant; December 17, 2017 2:07 PM) Colonoscopy [2012]: removed several benign polyps Nuclear stress test [01/09/2017]: 1. The resting electrocardiogram demonstrated normal sinus rhythm, normal resting conduction, no resting  arrhythmias and normal rest repolarization. Stress EKG is non-diagnostic for ischemia as it a pharmacologic stress using Lexiscan. 2. There is a moderate area of infarction in the basal inferior, basal inferolateral, mid inferior, mid inferolateral and apical inferior myocardial wall(s). The left ventricular ejection fraction was calculated or visually estimated to be 33%. Echocardiogram [12/23/2016]: Left ventricle cavity  is mildly dilated. Mild concentric hypertrophy of the left ventricle. Diffuse hypokinesis. Mild to moderate decrease in LV systolic function. Cannot exclude inferolateral hypokinesis. Poor apical views. Estimated LVEF 40-45% Doppler evidence of grade I (impaired) diastolic dysfunction, elevated LAP. Left atrial cavity is mildly dilated at 4 cm, but moderate to severely dilated by volume. Trace aortic regurgitation. Mild (Grade I) mitral regurgitation. Mild to moderate tricuspid regurgitation. Borderline mild pulmonary hypertension. Pulmonary artery systolic pressure is estimated at 29 mm Hg. ABI's [01/22/2017]: This exam reveals mildly decreased perfusion of the right lower extremity, with RABI 0.92 and moderately decreased perfusion of the left lower extremity, with LABI 0.67 noted at the post tibial artery level. Moderately abnormal waveforms of the right ankle. Severely abnormal waveforms of the left ankle. Endoscopy [2015]: Normal. Lower GI X-ray Series [03/2017]:    Review of Systems Sara Maine, FNP-C; 12/15/2017 2:41 PM) General Present- Tiredness. Not Present- Anorexia, Fatigue and Fever. Respiratory Present- Decreased Exercise Tolerance and Difficulty Breathing on Exertion (worsened over the last few weeks). Not Present- Cough and Dyspnea. Cardiovascular Present- Edema. Not Present- Chest Pain, Claudications, Orthopnea, Palpitations and Paroxysmal Nocturnal Dyspnea. Gastrointestinal Not Present- Black, Tarry Stool and Change in Bowel Habits. Neurological Not  Present- Focal Neurological Symptoms and Syncope. Endocrine Not Present- Cold Intolerance, Excessive Sweating, Heat Intolerance and Thyroid Problems. Hematology Not Present- Anemia, Easy Bruising, Petechiae and Prolonged Bleeding. All other systems negative  Vitals Anderson Malta Sergeant; 12/15/2017 2:16 PM) 12/15/2017 2:08 PM Weight: 195.25 lb Height: 63in Body Surface Area: 1.91 m Body Mass Index: 34.59 kg/m  Pulse: 95 (Regular)  P.OX: 97% (Room air) BP: 135/63 (Sitting, Left Arm, Standard)       Physical Exam Sara Maine, FNP-C; 12/15/2017 2:41 PM) General Mental Status-Alert. General Appearance-Cooperative, Appears stated age, Not in acute distress. Build & Nutrition-Moderately built and Mildly obese.  Head and Neck Thyroid Gland Characteristics - no palpable nodules, no palpable enlargement.  Chest and Lung Exam Chest and lung exam reveals -quiet, even and easy respiratory effort with no use of accessory muscles and on auscultation, normal breath sounds, no adventitious sounds.  Cardiovascular Inspection Jugular vein - Right - No Distention. Auscultation Rhythm - Frequent Ectopy. Heart Sounds - S1 WNL, S2 WNL and No gallop present. Murmurs & Other Heart Sounds: Murmur - Location - Apex. Timing - Early systolic. Grade - II/VI.  Abdomen Palpation/Percussion Normal exam - Non Tender and No hepatosplenomegaly. Auscultation Normal exam - Bowel sounds normal.  Peripheral Vascular Lower Extremity Inspection - Bilateral - Inspection Normal. Palpation - Edema - Bilateral - 3+ Pitting edema. Femoral pulse - Bilateral - Normal. Popliteal pulse - Bilateral - Normal. Dorsalis pedis pulse - Bilateral - Absent. Posterior tibial pulse - Bilateral - Absent. Carotid arteries - Bilateral-No Carotid bruit. Abdomen-No prominent abdominal aortic pulsation, No epigastric bruit.  Neurologic Neurologic evaluation reveals -alert and oriented x 3 with no impairment  of recent or remote memory. Motor-Grossly intact without any focal deficits.  Musculoskeletal - Did not examine.    Assessment & Plan Sara Maine FNP-C; 12/15/2017 2:41 PM) Dyspnea on exertion (R06.09) Malaise and fatigue (R53.81) Coronary arteriosclerosis (I25.10) Story: Lexiscan myoview stress test 01/09/2017: 1. The resting electrocardiogram demonstrated normal sinus rhythm, normal resting conduction, no resting arrhythmias and normal rest repolarization. Stress EKG is non-diagnostic for ischemia as it a pharmacologic stress using Lexiscan. 2. There is a moderate area of infarction in the basal inferior, basal inferolateral, mid inferior, mid inferolateral and apical inferior myocardial wall(s). The left  ventricular ejection fraction was calculated or visually estimated to be 33%. Impression: EKG 08/06/2017: Normal sinus rhythm at rate of 75 bpm, normal axis, no evidence of ischemia. Baseline artifact. Diffuse non-specific T wave flattening. No significant change from EKG 03/25/2017. Chronic diastolic HF (heart failure) (I50.32) Story: Echocardiogram 12/23/2016: Left ventricle cavity is mildly dilated. Mild concentric hypertrophy of the left ventricle. Diffuse hypokinesis. Mild to moderate decrease in LV systolic function. Cannot exclude inferolateral hypokinesis. Poor apical views. Estimated LVEF 40-45% Doppler evidence of grade I (impaired) diastolic dysfunction, elevated LAP. Left atrial cavity is mildly dilated at 4 cm, but moderate to severely dilated by volume. Trace aortic regurgitation. Mild (Grade I) mitral regurgitation. Mild to moderate tricuspid regurgitation. Borderline mild pulmonary hypertension. Pulmonary artery systolic pressure is estimated at 29 mm Hg. Edema of both legs (R60.0) Labwork Story: 12/24/2017: Glucose 210, Creatinine 1.02, EGFR 54/62, potassium 4.9, BMP normal.  CBC normal.  INR 1.0, prothrombin time 10.7.  08/31/2017: Cholesterol 124, triglycerides  67, HDL 55, LDL 56. RBC 3.59, hemoglobin normal, hematocrit 33.9, platelets 129, CBC normal. Creatinine 0.96, EGFR 58/66, potassium 4.0, sodium 146, CMP otherwise normal.  Labs 03/18/2017: Total cholesterol 212, triglycerides 86, HDL 59, LDL 136. Serum glucose 83 mg, BUN 24, creatinine 0.66, eGFR 87 mL. Potassium 3.5.  Labs 10/05/2016: Serum creatinine 0.85, BUN 18, potassium 4.4, CMP otherwise normal. TSH normal, total cholesterol 210, triglycerides 199, HDL 57, LDL 113. Non-HDL cholesterol 153. CBC normal, platelet count mildly reduced at 133.  04/15/2016: Creatinine 0.74, potassium 4.1, CMP normal, total cholesterol 210, triglycerides 101, HDL 60, LDL 130, non-HDL 150  02/21/2016: CBC normal, TSH 1.42, ANA negative  Note:. Recommendations:  Patient presents for 2 month follow-up visit for shortness of breath, leg edema, and fatigue. She reports dyspnea on exertion has worsened. She denies any exertional chest pain, but states that she gets tired easily and is unable to now walk at the grocery store without having to stop and rest. Continues to have significant leg edema despite making changes to her diet. Blood pressures remained stable. No evidence of decompensated heart on clinical exam. She had previously undergone nuclear stress test in February 2018 that was abnormal. Due to her continued symptoms, feel that we should proceed with right and left heart catheterization for further evaluation for dyspnea and fatigue. Schedule for cardiac catheterization, and possible angioplasty. We discussed regarding risks, benefits, alternatives to this including stress testing, CTA and continued medical therapy. Patient wants to proceed. Understands <1-2% risk of death, stroke, MI, urgent CABG, bleeding, infection, renal failure but not limited to these. Advised to hold olmesartan for the day of and two days before the procedure. She may also benefit from venous insufficiency study in the future. We will  see her back after the procedure for further recommendations and evaluation.  *I have discussed this case with [name] and he personally examined the patient and participated in formulating the plan.*  CC: Fulton Mole, PA    Signed by Sara Maine, FNP-C (12/15/2017 2:42 PM)

## 2017-12-29 ENCOUNTER — Ambulatory Visit (HOSPITAL_COMMUNITY)
Admission: RE | Admit: 2017-12-29 | Discharge: 2017-12-31 | Disposition: A | Discharge status: Home / Self Care | Payer: Medicare Other | Source: Ambulatory Visit | Attending: Cardiology | Admitting: Cardiology

## 2017-12-29 ENCOUNTER — Encounter (HOSPITAL_COMMUNITY)
Admission: RE | Disposition: A | Discharge status: Home / Self Care | Payer: Self-pay | Source: Ambulatory Visit | Attending: Cardiology

## 2017-12-29 ENCOUNTER — Other Ambulatory Visit: Payer: Self-pay

## 2017-12-29 ENCOUNTER — Encounter (HOSPITAL_COMMUNITY): Payer: Self-pay | Admitting: General Practice

## 2017-12-29 DIAGNOSIS — I2510 Atherosclerotic heart disease of native coronary artery without angina pectoris: Secondary | ICD-10-CM | POA: Diagnosis present

## 2017-12-29 DIAGNOSIS — R0609 Other forms of dyspnea: Secondary | ICD-10-CM

## 2017-12-29 DIAGNOSIS — R112 Nausea with vomiting, unspecified: Secondary | ICD-10-CM | POA: Diagnosis not present

## 2017-12-29 DIAGNOSIS — Z7982 Long term (current) use of aspirin: Secondary | ICD-10-CM | POA: Insufficient documentation

## 2017-12-29 DIAGNOSIS — R0602 Shortness of breath: Secondary | ICD-10-CM | POA: Diagnosis present

## 2017-12-29 DIAGNOSIS — Z794 Long term (current) use of insulin: Secondary | ICD-10-CM | POA: Insufficient documentation

## 2017-12-29 DIAGNOSIS — F329 Major depressive disorder, single episode, unspecified: Secondary | ICD-10-CM | POA: Insufficient documentation

## 2017-12-29 DIAGNOSIS — I110 Hypertensive heart disease with heart failure: Secondary | ICD-10-CM | POA: Insufficient documentation

## 2017-12-29 DIAGNOSIS — R51 Headache: Secondary | ICD-10-CM | POA: Diagnosis not present

## 2017-12-29 DIAGNOSIS — E785 Hyperlipidemia, unspecified: Secondary | ICD-10-CM | POA: Diagnosis not present

## 2017-12-29 DIAGNOSIS — E1151 Type 2 diabetes mellitus with diabetic peripheral angiopathy without gangrene: Secondary | ICD-10-CM | POA: Diagnosis not present

## 2017-12-29 DIAGNOSIS — F419 Anxiety disorder, unspecified: Secondary | ICD-10-CM | POA: Diagnosis not present

## 2017-12-29 DIAGNOSIS — E7800 Pure hypercholesterolemia, unspecified: Secondary | ICD-10-CM | POA: Diagnosis not present

## 2017-12-29 DIAGNOSIS — I5032 Chronic diastolic (congestive) heart failure: Secondary | ICD-10-CM | POA: Insufficient documentation

## 2017-12-29 DIAGNOSIS — Z955 Presence of coronary angioplasty implant and graft: Secondary | ICD-10-CM

## 2017-12-29 DIAGNOSIS — K219 Gastro-esophageal reflux disease without esophagitis: Secondary | ICD-10-CM | POA: Insufficient documentation

## 2017-12-29 DIAGNOSIS — Z9861 Coronary angioplasty status: Secondary | ICD-10-CM

## 2017-12-29 HISTORY — PX: CORONARY STENT INTERVENTION: CATH118234

## 2017-12-29 HISTORY — PX: RIGHT/LEFT HEART CATH AND CORONARY ANGIOGRAPHY: CATH118266

## 2017-12-29 HISTORY — PX: CORONARY ANGIOPLASTY WITH STENT PLACEMENT: SHX49

## 2017-12-29 LAB — POCT I-STAT 3, ART BLOOD GAS (G3+)
ACID-BASE DEFICIT: 4 mmol/L — AB (ref 0.0–2.0)
ACID-BASE DEFICIT: 4 mmol/L — AB (ref 0.0–2.0)
BICARBONATE: 21.5 mmol/L (ref 20.0–28.0)
Bicarbonate: 21 mmol/L (ref 20.0–28.0)
O2 SAT: 70 %
O2 Saturation: 93 %
PO2 ART: 71 mmHg — AB (ref 83.0–108.0)
TCO2: 22 mmol/L (ref 22–32)
TCO2: 23 mmol/L (ref 22–32)
pCO2 arterial: 39.2 mmHg (ref 32.0–48.0)
pCO2 arterial: 41 mmHg (ref 32.0–48.0)
pH, Arterial: 7.327 — ABNORMAL LOW (ref 7.350–7.450)
pH, Arterial: 7.338 — ABNORMAL LOW (ref 7.350–7.450)
pO2, Arterial: 39 mmHg — CL (ref 83.0–108.0)

## 2017-12-29 LAB — GLUCOSE, CAPILLARY
GLUCOSE-CAPILLARY: 94 mg/dL (ref 65–99)
GLUCOSE-CAPILLARY: 178 mg/dL — AB (ref 65–99)
Glucose-Capillary: 90 mg/dL (ref 65–99)

## 2017-12-29 LAB — POCT ACTIVATED CLOTTING TIME
ACTIVATED CLOTTING TIME: 257 s
ACTIVATED CLOTTING TIME: 323 s
Activated Clotting Time: 274 seconds

## 2017-12-29 SURGERY — RIGHT/LEFT HEART CATH AND CORONARY ANGIOGRAPHY
Anesthesia: LOCAL

## 2017-12-29 MED ORDER — FENTANYL CITRATE (PF) 100 MCG/2ML IJ SOLN
INTRAMUSCULAR | Status: AC
  Filled 2017-12-29: qty 2

## 2017-12-29 MED ORDER — ANGIOPLASTY BOOK
Freq: Once | Status: AC
  Administered 2017-12-29: 21:00:00 1
  Filled 2017-12-29: qty 1

## 2017-12-29 MED ORDER — MIDAZOLAM HCL 2 MG/2ML IJ SOLN
INTRAMUSCULAR | Status: DC | PRN
  Administered 2017-12-29: 2 mg via INTRAVENOUS

## 2017-12-29 MED ORDER — ONDANSETRON HCL 4 MG/2ML IJ SOLN
4.0000 mg | Freq: Four times a day (QID) | INTRAMUSCULAR | Status: DC | PRN
  Administered 2017-12-30 (×2): 4 mg via INTRAVENOUS
  Filled 2017-12-29 (×2): qty 2

## 2017-12-29 MED ORDER — MIDAZOLAM HCL 2 MG/2ML IJ SOLN
INTRAMUSCULAR | Status: AC
  Filled 2017-12-29: qty 2

## 2017-12-29 MED ORDER — BUSPIRONE HCL 15 MG PO TABS
7.5000 mg | ORAL_TABLET | Freq: Two times a day (BID) | ORAL | Status: DC
  Administered 2017-12-29 – 2017-12-31 (×4): 7.5 mg via ORAL
  Filled 2017-12-29 (×4): qty 1

## 2017-12-29 MED ORDER — OLMESARTAN MEDOXOMIL-HCTZ 40-25 MG PO TABS: 1.0000 | ORAL_TABLET | Freq: Every day | ORAL | Status: DC

## 2017-12-29 MED ORDER — SODIUM CHLORIDE 0.9 % WEIGHT BASED INFUSION
3.0000 mL/kg/h | INTRAVENOUS | Status: AC
  Administered 2017-12-29: 3 mL/kg/h via INTRAVENOUS

## 2017-12-29 MED ORDER — SODIUM CHLORIDE 0.9% FLUSH: 3.0000 mL | Freq: Two times a day (BID) | INTRAVENOUS | Status: DC

## 2017-12-29 MED ORDER — SODIUM CHLORIDE 0.9 % IV SOLN: 250.0000 mL | INTRAVENOUS | Status: DC | PRN

## 2017-12-29 MED ORDER — CLOPIDOGREL BISULFATE 300 MG PO TABS
ORAL_TABLET | ORAL | Status: AC
  Filled 2017-12-29: qty 2

## 2017-12-29 MED ORDER — ASPIRIN 81 MG PO CHEW
81.0000 mg | CHEWABLE_TABLET | Freq: Every day | ORAL | Status: DC
  Administered 2017-12-30 – 2017-12-31 (×2): 81 mg via ORAL
  Filled 2017-12-29 (×2): qty 1

## 2017-12-29 MED ORDER — AMLODIPINE BESYLATE 5 MG PO TABS
5.0000 mg | ORAL_TABLET | Freq: Every day | ORAL | Status: DC
  Administered 2017-12-30 – 2017-12-31 (×2): 5 mg via ORAL
  Filled 2017-12-29 (×3): qty 1

## 2017-12-29 MED ORDER — ROSUVASTATIN CALCIUM 40 MG PO TABS
40.0000 mg | ORAL_TABLET | Freq: Every day | ORAL | Status: DC
  Administered 2017-12-29 – 2017-12-30 (×2): 40 mg via ORAL
  Filled 2017-12-29 (×2): qty 1

## 2017-12-29 MED ORDER — SODIUM CHLORIDE 0.9% FLUSH: 3.0000 mL | INTRAVENOUS | Status: DC | PRN

## 2017-12-29 MED ORDER — INSULIN ASPART 100 UNIT/ML ~~LOC~~ SOLN
0.0000 [IU] | Freq: Three times a day (TID) | SUBCUTANEOUS | Status: DC
  Administered 2017-12-30: 5 [IU] via SUBCUTANEOUS
  Administered 2017-12-30: 06:00:00 3 [IU] via SUBCUTANEOUS
  Administered 2017-12-30: 12:00:00 5 [IU] via SUBCUTANEOUS
  Administered 2017-12-31: 3 [IU] via SUBCUTANEOUS

## 2017-12-29 MED ORDER — IOPAMIDOL (ISOVUE-370) INJECTION 76%
INTRAVENOUS | Status: AC
  Filled 2017-12-29: qty 50

## 2017-12-29 MED ORDER — HEPARIN (PORCINE) IN NACL 2-0.9 UNIT/ML-% IJ SOLN
INTRAMUSCULAR | Status: AC
  Filled 2017-12-29: qty 1000

## 2017-12-29 MED ORDER — LEVOTHYROXINE SODIUM 50 MCG PO TABS
50.0000 ug | ORAL_TABLET | Freq: Every day | ORAL | Status: DC
  Administered 2017-12-30 – 2017-12-31 (×2): 50 ug via ORAL
  Filled 2017-12-29 (×2): qty 1

## 2017-12-29 MED ORDER — LIDOCAINE HCL (PF) 1 % IJ SOLN
INTRAMUSCULAR | Status: DC | PRN
  Administered 2017-12-29: 5 mL

## 2017-12-29 MED ORDER — CLOPIDOGREL BISULFATE 300 MG PO TABS
ORAL_TABLET | ORAL | Status: DC | PRN
  Administered 2017-12-29: 600 mg via ORAL

## 2017-12-29 MED ORDER — HEPARIN SODIUM (PORCINE) 1000 UNIT/ML IJ SOLN
INTRAMUSCULAR | Status: AC
  Filled 2017-12-29: qty 1

## 2017-12-29 MED ORDER — VERAPAMIL HCL 2.5 MG/ML IV SOLN
INTRAVENOUS | Status: DC | PRN
  Administered 2017-12-29: 10 mL via INTRA_ARTERIAL

## 2017-12-29 MED ORDER — ACETAMINOPHEN 325 MG PO TABS
650.0000 mg | ORAL_TABLET | ORAL | Status: DC | PRN
  Administered 2017-12-30: 06:00:00 650 mg via ORAL
  Filled 2017-12-29: qty 2

## 2017-12-29 MED ORDER — EZETIMIBE 10 MG PO TABS
10.0000 mg | ORAL_TABLET | Freq: Every day | ORAL | Status: DC
  Administered 2017-12-30 – 2017-12-31 (×2): 10 mg via ORAL
  Filled 2017-12-29 (×2): qty 1

## 2017-12-29 MED ORDER — SODIUM CHLORIDE 0.9 % IV SOLN: INTRAVENOUS | Status: AC

## 2017-12-29 MED ORDER — LABETALOL HCL 5 MG/ML IV SOLN: 10.0000 mg | INTRAVENOUS | Status: AC | PRN

## 2017-12-29 MED ORDER — IOPAMIDOL (ISOVUE-370) INJECTION 76%
INTRAVENOUS | Status: AC
  Filled 2017-12-29: qty 100

## 2017-12-29 MED ORDER — PANTOPRAZOLE SODIUM 40 MG PO TBEC
40.0000 mg | DELAYED_RELEASE_TABLET | Freq: Every day | ORAL | Status: DC
  Administered 2017-12-29 – 2017-12-30 (×2): 40 mg via ORAL
  Filled 2017-12-29 (×2): qty 1

## 2017-12-29 MED ORDER — SODIUM CHLORIDE 0.9 % WEIGHT BASED INFUSION: 1.0000 mL/kg/h | INTRAVENOUS | Status: DC

## 2017-12-29 MED ORDER — HYDROCHLOROTHIAZIDE 25 MG PO TABS
25.0000 mg | ORAL_TABLET | Freq: Every day | ORAL | Status: DC
  Administered 2017-12-30 – 2017-12-31 (×2): 25 mg via ORAL
  Filled 2017-12-29 (×2): qty 1

## 2017-12-29 MED ORDER — SODIUM CHLORIDE 0.9% FLUSH
3.0000 mL | Freq: Two times a day (BID) | INTRAVENOUS | Status: DC
  Administered 2017-12-30: 09:00:00 3 mL via INTRAVENOUS

## 2017-12-29 MED ORDER — ENSURE ENLIVE PO LIQD
237.0000 mL | Freq: Two times a day (BID) | ORAL | Status: DC
  Administered 2017-12-31: 237 mL via ORAL
  Filled 2017-12-29 (×6): qty 237

## 2017-12-29 MED ORDER — LIDOCAINE HCL 1 % IJ SOLN
INTRAMUSCULAR | Status: AC
  Filled 2017-12-29: qty 20

## 2017-12-29 MED ORDER — FENTANYL CITRATE (PF) 100 MCG/2ML IJ SOLN
INTRAMUSCULAR | Status: DC | PRN
  Administered 2017-12-29: 25 ug via INTRAVENOUS

## 2017-12-29 MED ORDER — ASPIRIN 81 MG PO CHEW: 81.0000 mg | CHEWABLE_TABLET | ORAL | Status: DC

## 2017-12-29 MED ORDER — VERAPAMIL HCL 2.5 MG/ML IV SOLN
INTRAVENOUS | Status: AC
  Filled 2017-12-29: qty 2

## 2017-12-29 MED ORDER — IRBESARTAN 300 MG PO TABS
300.0000 mg | ORAL_TABLET | Freq: Every day | ORAL | Status: DC
  Administered 2017-12-30 – 2017-12-31 (×2): 300 mg via ORAL
  Filled 2017-12-29 (×3): qty 1

## 2017-12-29 MED ORDER — CLOPIDOGREL BISULFATE 75 MG PO TABS
75.0000 mg | ORAL_TABLET | Freq: Every day | ORAL | Status: DC
  Administered 2017-12-30 – 2017-12-31 (×2): 75 mg via ORAL
  Filled 2017-12-29 (×2): qty 1

## 2017-12-29 MED ORDER — HYDRALAZINE HCL 20 MG/ML IJ SOLN: 5.0000 mg | INTRAMUSCULAR | Status: AC | PRN

## 2017-12-29 MED ORDER — ISOSORBIDE MONONITRATE ER 60 MG PO TB24
60.0000 mg | ORAL_TABLET | Freq: Every day | ORAL | Status: DC
  Administered 2017-12-29 – 2017-12-31 (×3): 60 mg via ORAL
  Filled 2017-12-29 (×3): qty 1

## 2017-12-29 MED ORDER — HEPARIN SODIUM (PORCINE) 1000 UNIT/ML IJ SOLN
INTRAMUSCULAR | Status: DC | PRN
  Administered 2017-12-29: 2000 [IU] via INTRAVENOUS
  Administered 2017-12-29: 4600 [IU] via INTRAVENOUS
  Administered 2017-12-29: 3000 [IU] via INTRAVENOUS
  Administered 2017-12-29: 4000 [IU] via INTRAVENOUS

## 2017-12-29 SURGICAL SUPPLY — 23 items
BALLN WOLVERINE 2.00X15 (BALLOONS) ×2
BALLN WOLVERINE 2.00X6 (BALLOONS) ×2
BALLN WOLVERINE 2.75X10 (BALLOONS) ×2
BALLOON WOLVERINE 2.00X15 (BALLOONS) ×1 IMPLANT
BALLOON WOLVERINE 2.00X6 (BALLOONS) ×1 IMPLANT
BALLOON WOLVERINE 2.75X10 (BALLOONS) ×1 IMPLANT
CATH BALLN WEDGE 5F 110CM (CATHETERS) ×2 IMPLANT
CATH OPTITORQUE TIG 4.0 5F (CATHETERS) ×2 IMPLANT
CATH VISTA GUIDE 6FR AL1 (CATHETERS) ×2 IMPLANT
CATH VISTA GUIDE 6FR XB3.5 (CATHETERS) ×2 IMPLANT
DEVICE RAD COMP TR BAND LRG (VASCULAR PRODUCTS) ×2 IMPLANT
GUIDEWIRE .025 260CM (WIRE) ×2 IMPLANT
GUIDEWIRE INQWIRE 1.5J.035X260 (WIRE) ×2 IMPLANT
INQWIRE 1.5J .035X260CM (WIRE) ×4
KIT ENCORE 26 ADVANTAGE (KITS) ×2 IMPLANT
KIT HEART LEFT (KITS) ×2 IMPLANT
KIT HEMO VALVE WATCHDOG (MISCELLANEOUS) ×2 IMPLANT
PACK CARDIAC CATHETERIZATION (CUSTOM PROCEDURE TRAY) ×2 IMPLANT
SHEATH GLIDE SLENDER 4/5FR (SHEATH) ×2 IMPLANT
STENT RESOLUTE ONYX 3.0X15 (Permanent Stent) ×2 IMPLANT
TRANSDUCER W/STOPCOCK (MISCELLANEOUS) ×2 IMPLANT
TUBING CIL FLEX 10 FLL-RA (TUBING) ×2 IMPLANT
WIRE COUGAR XT STRL 190CM (WIRE) ×4 IMPLANT

## 2017-12-29 NOTE — Progress Notes (Signed)
Site area: rt ac venous sheath Site Prior to Removal:  Level 0 Pressure Applied For: 15 minutes Manual:   yes Patient Status During Pull:  stable Post Pull Site:  Level 0 Post Pull Instructions Given:  yes Post Pull Pulses Present: palpable rt radial Dressing Applied:  Gauze and tegaderm Bedrest begins @ 4360 Comments: unable to aspirate blood from sheath to check ACT

## 2017-12-29 NOTE — Progress Notes (Signed)
TR BAND REMOVAL  LOCATION:    right radial  DEFLATED PER PROTOCOL:    Yes.    TIME BAND OFF / DRESSING APPLIED:    1900   SITE UPON ARRIVAL:    Level 0  SITE AFTER BAND REMOVAL:    Level 0  CIRCULATION SENSATION AND MOVEMENT:    Within Normal Limits   Yes.

## 2017-12-29 NOTE — Interval H&P Note (Signed)
History and Physical Interval Note:  12/29/2017 12:57 PM  Sara Graves  has presented today for surgery, with the diagnosis of shortness of breath - fatigue  The various methods of treatment have been discussed with the patient and family. After consideration of risks, benefits and other options for treatment, the patient has consented to  Procedure(s): RIGHT/LEFT HEART CATH AND CORONARY ANGIOGRAPHY (N/A) and possible angioplasty as a surgical intervention .  The patient's history has been reviewed, patient examined, no change in status, stable for surgery.  I have reviewed the patient's chart and labs.  Questions were answered to the patient's satisfaction.   Symptom Status: Ischemic Symptoms Non-invasive Testing: High risk If no or indeterminate stress test, FFR/iFR results in all diseased vessels: N/A Diabetes Mellitus: Yes S/P CABG: No Antianginal therapy (number of long-acting drugs): >=2 Patient undergoing renal transplant: No Patient undergoing percutaneous valve procedure: No   1 Vessel Disease No proximal LAD involvement, No proximal left dominant LCX involvement  PCI: A (8);  Indication 2  CABG: M (6);  Indication 2 Proximal left dominant LCX involvement  PCI: A (8);  Indication 5  CABG: A (8);  Indication 5 Proximal LAD involvement  PCI: A (8);  Indication 5  CABG: A (8);  Indication 5  2 Vessel Disease No proximal LAD involvement  PCI: A (8);  Indication 8  CABG: A (7);  Indication 8 Proximal LAD involvement  PCI: A (8);  Indication 14  CABG: A (9);  Indication 14  3 Vessel Disease Low disease complexity (e.g., focal stenoses, SYNTAX <=22)  PCI: A (7);  Indication 19  CABG: A (9);  Indication 19 Intermediate or high disease complexity (e.g., SYNTAX >=23)  PCI: M (6);  Indication 23  CABG: A (9);  Indication 23  Left Main Disease Isolated LMCA disease: ostial or midshaft  PCI: A (7);  Indication 24  CABG: A (9);  Indication 24 Isolated LMCA disease:  bifurcation involvement  PCI: M (6);  Indication 25  CABG: A (9);  Indication 25 LMCA ostial or midshaft, concurrent low disease burden multivessel disease (e.g., 1-2 additional focal stenoses, SYNTAX <=22)  PCI: A (7);  Indication 26  CABG: A (9);  Indication 26 LMCA ostial or midshaft, concurrent intermediate or high disease burden multivessel disease (e.g., 1-2 additional bifurcation stenoses, long stenoses, SYNTAX >=23)  PCI: M (4);  Indication 27  CABG: A (9);  Indication 27 LMCA bifurcation involvement, concurrent low disease burden multivessel disease (e.g., 1-2 additional focal stenoses, SYNTAX <=22)  PCI: M (6);  Indication 28  CABG: A (9);  Indication 28 LMCA bifurcation involvement, concurrent intermediate or high disease burden multivessel disease (e.g., 1-2 additional bifurcation stenoses, long stenoses, SYNTAX >=23)  PCI: R (3);  Indication 29  CABG: A (9);  Indication 29  Notes:  A indicates appropriate. M indicates may be appropriate. R indicates rarely appropriate. Number in parentheses is median score for that indication. Reclassify indicates number of functionally diseased vessels should be decreased given negative FFR/iFR. Re-evaluate the scenario interpreting any FFR/iFR negative vessel as being not significantly stenosed.  Disease means involved vessel provides flow to a sufficient amount of myocardium to be clinically important.  If FFR testing indicates a vessel is not significant, that vessel should not be considered diseased (and the patient should be reclassified with respect to extent of functionally significant disease).  Proximal LAD + proximal left dominant LCX is considered 3 vessel CAD  2 Vessel CAD with FFR/iFR abnormal in only  1 but not both is considered 1 vessel CAD  Disease complexity includes occlusion, bifurcation, trifurcation, ostial, >20 mm, tortuosity, calcification, thrombus  LMCA disease is >=50% by angiography, MLD <2.8 mm, MLA <6 mm2; MLA 6-7.5  mm2 requires further physiologic  See Table B for risk stratification based on noninvasive testing  Journal of the [employer] of Cardiology Mar 2017, 23391; DOI: 10.1016/j.[email or web address] [email or web address].full-[email or web address] This App  2018 by the Society for Cardiovascular Angiography and Interventions   Adrian Prows

## 2017-12-30 ENCOUNTER — Ambulatory Visit (HOSPITAL_COMMUNITY): Payer: Medicare Other

## 2017-12-30 ENCOUNTER — Encounter (HOSPITAL_COMMUNITY): Payer: Self-pay | Admitting: Cardiology

## 2017-12-30 DIAGNOSIS — I2510 Atherosclerotic heart disease of native coronary artery without angina pectoris: Secondary | ICD-10-CM | POA: Diagnosis not present

## 2017-12-30 DIAGNOSIS — E7800 Pure hypercholesterolemia, unspecified: Secondary | ICD-10-CM | POA: Diagnosis not present

## 2017-12-30 DIAGNOSIS — I5032 Chronic diastolic (congestive) heart failure: Secondary | ICD-10-CM | POA: Diagnosis not present

## 2017-12-30 DIAGNOSIS — I110 Hypertensive heart disease with heart failure: Secondary | ICD-10-CM | POA: Diagnosis not present

## 2017-12-30 LAB — CBC
HCT: 33.2 % — ABNORMAL LOW (ref 36.0–46.0)
HEMOGLOBIN: 11.1 g/dL — AB (ref 12.0–15.0)
MCH: 30.2 pg (ref 26.0–34.0)
MCHC: 33.4 g/dL (ref 30.0–36.0)
MCV: 90.5 fL (ref 78.0–100.0)
Platelets: 104 10*3/uL — ABNORMAL LOW (ref 150–400)
RBC: 3.67 MIL/uL — ABNORMAL LOW (ref 3.87–5.11)
RDW: 14 % (ref 11.5–15.5)
WBC: 4.9 10*3/uL (ref 4.0–10.5)

## 2017-12-30 LAB — BASIC METABOLIC PANEL
ANION GAP: 9 (ref 5–15)
BUN: 26 mg/dL — AB (ref 6–20)
CHLORIDE: 109 mmol/L (ref 101–111)
CO2: 21 mmol/L — AB (ref 22–32)
Calcium: 8.7 mg/dL — ABNORMAL LOW (ref 8.9–10.3)
Creatinine, Ser: 0.95 mg/dL (ref 0.44–1.00)
GFR calc Af Amer: >60 mL/min (ref >60)
GFR calc non Af Amer: 57 mL/min — ABNORMAL LOW (ref >60)
GLUCOSE: 156 mg/dL — AB (ref 65–99)
POTASSIUM: 3.8 mmol/L (ref 3.5–5.1)
Sodium: 139 mmol/L (ref 135–145)

## 2017-12-30 LAB — GLUCOSE, CAPILLARY
GLUCOSE-CAPILLARY: 174 mg/dL — AB (ref 65–99)
GLUCOSE-CAPILLARY: 231 mg/dL — AB (ref 65–99)
GLUCOSE-CAPILLARY: 228 mg/dL — AB (ref 65–99)
Glucose-Capillary: 108 mg/dL — ABNORMAL HIGH (ref 65–99)
Glucose-Capillary: 220 mg/dL — ABNORMAL HIGH (ref 65–99)

## 2017-12-30 MED ORDER — SODIUM CHLORIDE 0.9 % IV SOLN
Freq: Once | INTRAVENOUS | Status: AC
  Administered 2017-12-30: 13:00:00 via INTRAVENOUS

## 2017-12-30 MED ORDER — PANTOPRAZOLE SODIUM 40 MG PO TBEC
40.0000 mg | DELAYED_RELEASE_TABLET | Freq: Two times a day (BID) | ORAL | Status: DC
  Administered 2017-12-30 – 2017-12-31 (×2): 40 mg via ORAL
  Filled 2017-12-30 (×2): qty 1

## 2017-12-30 MED ORDER — GI COCKTAIL ~~LOC~~
30.0000 mL | Freq: Once | ORAL | Status: AC
  Administered 2017-12-30: 30 mL via ORAL
  Filled 2017-12-30: qty 30

## 2017-12-30 MED ORDER — METOCLOPRAMIDE HCL 5 MG/ML IJ SOLN
5.0000 mg | Freq: Three times a day (TID) | INTRAMUSCULAR | Status: DC
  Administered 2017-12-30 – 2017-12-31 (×2): 5 mg via INTRAVENOUS
  Filled 2017-12-30 (×2): qty 2

## 2017-12-30 MED ORDER — DOCUSATE SODIUM 100 MG PO CAPS
200.0000 mg | ORAL_CAPSULE | Freq: Once | ORAL | Status: AC
  Administered 2017-12-30: 200 mg via ORAL
  Filled 2017-12-30: qty 2

## 2017-12-30 MED ORDER — PROMETHAZINE HCL 25 MG/ML IJ SOLN
12.5000 mg | Freq: Once | INTRAMUSCULAR | Status: AC
  Administered 2017-12-30: 12.5 mg via INTRAVENOUS
  Filled 2017-12-30: qty 1

## 2017-12-30 MED ORDER — LABETALOL HCL 5 MG/ML IV SOLN
10.0000 mg | Freq: Once | INTRAVENOUS | Status: AC
  Administered 2017-12-30: 10 mg via INTRAVENOUS
  Filled 2017-12-30: qty 4

## 2017-12-30 MED ORDER — HYDRALAZINE HCL 20 MG/ML IJ SOLN
10.0000 mg | Freq: Four times a day (QID) | INTRAMUSCULAR | Status: DC | PRN
  Administered 2017-12-30: 10 mg via INTRAVENOUS
  Filled 2017-12-30: qty 1

## 2017-12-30 MED ORDER — LABETALOL HCL 5 MG/ML IV SOLN
10.0000 mg | Freq: Four times a day (QID) | INTRAVENOUS | Status: DC | PRN
  Administered 2017-12-30: 10 mg via INTRAVENOUS
  Filled 2017-12-30: qty 4

## 2017-12-30 MED FILL — Heparin Sodium (Porcine) 2 Unit/ML in Sodium Chloride 0.9%: INTRAMUSCULAR | Qty: 1000 | Status: AC

## 2017-12-30 MED FILL — Lidocaine HCl Local Inj 1%: INTRAMUSCULAR | Qty: 20 | Status: AC

## 2017-12-30 NOTE — Progress Notes (Signed)
Subjective:  Episodes of headache and vomiting since morning CT head with no bleeding/acute abnormality. No chest pain, shortness of breath  Objective:  Vital Signs in the last 24 hours: Temp:  [97 F (36.1 C)-98.3 F (36.8 C)] 97 F (36.1 C) (02/13 0700) Pulse Rate:  [54-75] 65 (02/13 0700) Resp:  [10-32] 20 (02/13 0157) BP: (102-170)/(29-110) 170/38 (02/13 0700) SpO2:  [85 %-100 %] 95 % (02/13 0700) Weight:  [86.6 kg (190 lb 14.7 oz)] 86.6 kg (190 lb 14.7 oz) (02/13 0157)  Intake/Output from previous day: 02/12 0701 - 02/13 0700 In: 538.8 [P.O.:240; I.V.:298.8] Out: 1700 [Urine:1700] Intake/Output from this shift: Total I/O In: -  Out: 160 [Emesis/NG output:160]  Physical Exam: General Mental Status-Alert. General Appearance-Cooperative, Appears stated age, Not in acute distress. Build & Nutrition-Moderately built and Mildly obese.  Head and Neck Thyroid Gland Characteristics - no palpable nodules, no palpable enlargement.  Chest and Lung Exam Chest and lung exam reveals -quiet, even and easy respiratory effort with no use of accessory muscles and on auscultation, normal breath sounds, no adventitious sounds.  Cardiovascular Inspection Jugular vein - Right - No Distention. Auscultation Rhythm - Frequent Ectopy. Heart Sounds - S1 WNL, S2 WNL and No gallop present. Murmurs & Other Heart Sounds: Murmur - Location - Apex. Timing - Early systolic. Grade - II/VI. Right radial site with no bleeding, hematoma. Good capillary refill.    Abdomen Palpation/Percussion Normal exam - Non Tender and No hepatosplenomegaly. Auscultation Normal exam - Bowel sounds normal.  Peripheral Vascular Lower Extremity Inspection - Bilateral - Inspection Normal. Palpation - Edema - Bilateral - 3+ Pitting edema. Femoral pulse - Bilateral - Normal. Popliteal pulse - Bilateral - Normal. Dorsalis pedis pulse - Bilateral - Absent. Posterior tibial pulse - Bilateral -  Absent. Carotid arteries - Bilateral-No Carotid bruit. Abdomen-No prominent abdominal aortic pulsation, No epigastric bruit.  Neurologic Neurologic evaluation reveals -alert and oriented x 3 with no impairment of recent or remote memory. Motor-Grossly intact without any focal deficits.  Musculoskeletal - Did not examine.    Lab Results: Recent Labs    12/30/17 0300  WBC 4.9  HGB 11.1*  PLT 104*   Recent Labs    12/30/17 0300  NA 139  K 3.8  CL 109  CO2 21*  GLUCOSE 156*  BUN 26*  CREATININE 0.95    Imaging: CT head 12/30/2017: CLINICAL DATA:  Severe headache.  EXAM: CT HEAD WITHOUT CONTRAST  TECHNIQUE: Contiguous axial images were obtained from the base of the skull through the vertex without intravenous contrast.  COMPARISON:  None.  FINDINGS: Brain: No evidence of acute infarction, hemorrhage, hydrocephalus, extra-axial collection or mass lesion/mass effect. Mild age related cerebral atrophy and chronic microvascular ischemic changes.  Vascular: Atherosclerotic vascular calcification of the carotid siphons. No hyperdense vessel.  Skull: Normal. Negative for fracture or focal lesion.  Sinuses/Orbits: No acute finding.  Other: None.  IMPRESSION: 1. Normal for age noncontrast head CT. No acute intracranial abnormality.  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 No significant abdominal findings on exam. CAD status post LAD PCI Hypertension Hyperlipidemia Type II diabetes mellitus  Plan: Aspirin 81 mg  lifelong, Plavix 75 mg daily at least for 6 months.  Increase Crestor to 40 mg daily. Continue baseline antihypertensive therapy.   Symptomatic management for nausea vomiting and headache. Will watch the patient today.    LOS: 0 days    Vendela Troung J Rayyan Orsborn 12/30/2017, 12:53 PM  Gregory, MD [facility] [place] Pager: [phone] Office: [phone] If no answer Cell [phone]

## 2017-12-30 NOTE — Progress Notes (Signed)
CARDIAC REHAB PHASE I   PRE:  Rate/Rhythm: 65 SR   BP:  Supine: 162/42  Sitting:   Standing:    SaO2: 96%RA  MODE:  Ambulation: to bathroom  ft   POST:  Rate/Rhythm: 78 SR [phone] Pt still nauseated. Changed her gown and got her new nausea apparatus as she had already thrown up in one. Assisted to bathroom and pt became more nauseous. Assisted to recliner and she began to throw up. Notified RN. Will return later if pt for discharge as this is not an appropriate time for ed. Will continue to follow.     Graylon Good, RN BSN  12/30/2017 9:23 AM

## 2017-12-30 NOTE — Progress Notes (Signed)
Episodes of headache and recurrent nausea vomiting this morning.  No focal neurological signs.  No chest pain.  Will obtain CT head to rule out bleeding.  Discharge held pending above investigation and stabilization.  Nigel Mormon, MD [facility]. [place] Pager: [phone] Office: [phone] If no answer Cell [phone]

## 2017-12-30 NOTE — Progress Notes (Signed)
Patient stil c/o nausea, vomitting( x4)  intermittently with  greenish yellow emesis, denies chest pain , headache "less pain now " per patient. SB/p remain on 170"s , dr Virgina Jock notified. With orders. Phenergan and GI cocktail administered as ordered. Head CT scan ordered. Kept monitored.

## 2017-12-31 DIAGNOSIS — I2510 Atherosclerotic heart disease of native coronary artery without angina pectoris: Secondary | ICD-10-CM | POA: Diagnosis not present

## 2017-12-31 LAB — GLUCOSE, CAPILLARY: Glucose-Capillary: 170 mg/dL — ABNORMAL HIGH (ref 65–99)

## 2017-12-31 MED ORDER — ROSUVASTATIN CALCIUM 40 MG PO TABS: 40.0000 mg | ORAL_TABLET | Freq: Every day | ORAL | 3 refills | Status: DC

## 2017-12-31 MED ORDER — AMLODIPINE BESYLATE 10 MG PO TABS: 10.0000 mg | ORAL_TABLET | Freq: Every day | ORAL | 1 refills | Status: DC

## 2017-12-31 MED ORDER — CLOPIDOGREL BISULFATE 75 MG PO TABS: 75.0000 mg | ORAL_TABLET | Freq: Every day | ORAL | 3 refills | Status: DC

## 2017-12-31 MED ORDER — AMLODIPINE BESYLATE 5 MG PO TABS: 5.0000 mg | ORAL_TABLET | Freq: Every day | ORAL | 1 refills | Status: DC

## 2017-12-31 MED ORDER — PANTOPRAZOLE SODIUM 40 MG PO TBEC: 40.0000 mg | DELAYED_RELEASE_TABLET | Freq: Two times a day (BID) | ORAL | 1 refills | Status: DC

## 2017-12-31 NOTE — Progress Notes (Signed)
CARDIAC REHAB PHASE I   PRE:  Rate/Rhythm: 84 SR  BP:  Supine: 121/48  Sitting:   Standing:    SaO2:  MODE:  Ambulation: 200 ft   POST:  Rate/Rhythm: 105 ST  BP:  Supine:   Sitting: 130/66  Standing:    SaO2:  [id number] Removed purewick so we could walk. Pt incontinent of urine to bathroom. Helped pt clean and put mesh panty and pad on. Pt walked 200 ft with asst x1. Pt is a little wobbly. Encouraged pt to use walker which is available at home since she has not been up walking much. Discussed ex ed, carb counting, NTG use and CRP 2. Referring to [facility] program. To bed after walk. No CP with walk. Discussed the importance of plavix with stent.   Graylon Good, RN BSN  12/31/2017 10:40 AM

## 2018-01-01 NOTE — Discharge Summary (Signed)
Physician Discharge Summary  [name] MRN: [id number] DOB/AGE: [birth year] 77 y.o.  Admit date: 12/29/2017 Discharge date: 01/01/2018  Admission Diagnoses: Abnormal stress test Shortness of breath  Discharge Diagnoses:  Principal Problem:   Shortness of breath Active Problems:   Post PTCA Nausea. Vomiting, likely gastroparesis  Discharged Condition: stable  Hospital Course:  Patient underwent PCI to ostial LAD, and unsuccessful PTCA and small and tortuous ramus. Ramus is best treated medically at this time. Patients hospital stay was prolonged to two nights due to headache and intractable nausea, vomiting. Given addition of plavix, CT head was obtained to rule out bleeding. CT scan head did not show any bleeding. It was later discovered that patient has had this nausea and vomiting from time to time for last four years, likely due to gastroparesis. Symptomatic management was done with antiemetics, PPI, and reglan. She was discharged on protonix, instead of omeprazole due to concurrent plavix use. Her BP was elevated during hospital stay, but was back to baseline on discharge. No changes were made to her antihypertensives. She will follow up in the office in 1 week.   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 rate 18, height 5\' 4"  (1.626 m), weight 87 kg (191 lb 12.8 oz), SpO2 92 %. General Mental Status-Alert. General Appearance-Cooperative, Appears stated age, Not in acute distress. Build & Nutrition-Moderately built and Mildly obese.  Head and Neck Thyroid Gland Characteristics - no palpable nodules, no palpable enlargement.  Chest and Lung Exam Chest and lung exam reveals -quiet, even and easy respiratory effort with no use of accessory muscles and on auscultation, normal breath sounds, no adventitious sounds.  Cardiovascular Inspection Jugular vein - Right - No Distention. Auscultation Rhythm - Frequent Ectopy. Heart Sounds - S1 WNL, S2 WNL and No gallop present. Murmurs & Other Heart Sounds: Murmur - Location - Apex. Timing - Early systolic. Grade - II/VI. Right radial site with no bleeding, hematoma. Good capillary refill.    Abdomen Palpation/Percussion Normal exam - Non Tender and No hepatosplenomegaly. Auscultation Normal exam - Bowel sounds normal.  Peripheral Vascular Lower Extremity Inspection - Bilateral - Inspection Normal. Palpation - Edema - Bilateral - 3+ Pitting edema. Femoral pulse - Bilateral - Normal. Popliteal pulse - Bilateral - Normal. Dorsalis pedis pulse - Bilateral - Absent. Posterior tibial pulse - Bilateral - Absent. Carotid arteries - Bilateral-No Carotid bruit. Abdomen-No prominent abdominal  aortic pulsation,  No epigastric bruit.  Neurologic Neurologic evaluation reveals -alert and oriented x 3 with no impairment of recent or remote memory. Motor-Grossly intact without any focal deficits.  Musculoskeletal - Did not examine.     Disposition: 01-Home or Self Care  Discharge Instructions    Amb Referral to Cardiac Rehabilitation   Complete by:  As directed    Diagnosis:  Coronary Stents   Diet - low sodium heart healthy   Complete by:  As directed    Diet - low sodium heart healthy   Complete by:  As directed    Increase activity slowly   Complete by:  As directed    Increase activity slowly   Complete by:  As directed      Allergies as of 12/31/2017      Reactions   Rosiglitazone Maleate Anaphylaxis, Swelling     (ANTIDIABETIC MED.) All over body    Morphine Other (See Comments)   SEVERE HYPOTENSION   Sulfa Antibiotics    Diarrhea, nausea and vomiting.   Elavil [amitriptyline] Nausea Only   Tramadol-acetaminophen Rash      Medication List    STOP taking these medications   omeprazole 40 MG capsule Commonly known as:  PRILOSEC     TAKE these medications   amLODipine 5 MG tablet Commonly known as:  NORVASC Take 1 tablet (5 mg total) by mouth daily.   [employer] ASPIRIN 81 MG chewable tablet Generic drug:  aspirin Chew 81 mg by mouth daily.   busPIRone 7.5 MG tablet Commonly known as:  BUSPAR Take 7.5 mg by mouth 2 (two) times daily.   CALCIUM 600 + D PO Take 1 tablet by mouth daily.   clopidogrel 75 MG tablet Commonly known as:  PLAVIX Take 1 tablet (75 mg total) by mouth daily with breakfast.   ezetimibe 10 MG tablet Commonly known as:  ZETIA Take 10 mg by mouth daily.   Fish Oil 1000 MG Caps Take 1,000 mg by mouth 2 (two) times daily. OTC fish oil   furosemide 20 MG tablet Commonly known as:  LASIX 2 tabs po qam and 1 tab po qlunch. F/u with PCP for any further refills. What changed:    how much to take  how to take this  when to  take this  reasons to take this  additional instructions Notes to patient:  As instructed   insulin aspart 100 UNIT/ML injection Commonly known as:  novoLOG Inject into the skin See admin instructions. Pt has VIIGO System   isosorbide mononitrate 60 MG 24 hr tablet Commonly known as:  IMDUR Take 60 mg by mouth daily.   levothyroxine 50 MCG tablet Commonly known as:  SYNTHROID, LEVOTHROID Take 50 mcg by mouth every morning.   olmesartan-hydrochlorothiazide 40-25 MG tablet Commonly known as:  BENICAR HCT Take 1 tablet by mouth daily.   pantoprazole 40 MG tablet Commonly known as:  PROTONIX Take 1 tablet (40 mg total) by mouth 2 (two) times daily.   potassium chloride SA 20 MEQ tablet Commonly known as:  KLOR-CON M20 Take 1 tablet (20 mEq total) by mouth 2 (two) times daily. With lasix. F/u with PCP for any additional refills. What changed:    when to take this  reasons to take this  additional instructions   rosuvastatin 40 MG tablet Commonly known as:  CRESTOR Take 1 tablet (40 mg total) by mouth at bedtime. What changed:    medication strength  how much to take  valsartan-hydrochlorothiazide 320-12.5 MG tablet Commonly known as:  DIOVAN-HCT Take 1 tablet by mouth daily.   Vitamin D (Ergocalciferol) 50000 units Caps capsule Commonly known as:  DRISDOL Take 50,000 Units by mouth every 7 (seven) days.        SignedNigel Mormon 01/01/2018, 6:11 AM  Nigel Mormon, MD [facility] [place] Pager: [phone] Office: [phone] If no answer Cell [phone]

## 2018-01-04 ENCOUNTER — Telehealth (HOSPITAL_COMMUNITY): Payer: Self-pay

## 2018-01-04 NOTE — Telephone Encounter (Signed)
Patients insurance is active and benefits verified through [facility] - $20.00 co-pay, no deductible, out of pocket amount of $4,000/$118.45 has been met, no co-insurance, and no pre-authorization is required. Passport/reference [phone]  Patient will be contacted and scheduled upon review by the RN Navigator.

## 2018-01-19 ENCOUNTER — Telehealth (HOSPITAL_COMMUNITY): Payer: Self-pay

## 2018-01-19 NOTE — Telephone Encounter (Signed)
Attempted to call patient in regards to Cardiac Rehab - lm on vm °

## 2018-01-21 DIAGNOSIS — E860 Dehydration: Secondary | ICD-10-CM | POA: Insufficient documentation

## 2018-01-21 DIAGNOSIS — F419 Anxiety disorder, unspecified: Secondary | ICD-10-CM

## 2018-01-21 DIAGNOSIS — F329 Major depressive disorder, single episode, unspecified: Secondary | ICD-10-CM | POA: Insufficient documentation

## 2018-01-21 DIAGNOSIS — F32A Depression, unspecified: Secondary | ICD-10-CM | POA: Insufficient documentation

## 2018-01-21 DIAGNOSIS — K219 Gastro-esophageal reflux disease without esophagitis: Secondary | ICD-10-CM | POA: Insufficient documentation

## 2018-01-21 DIAGNOSIS — I2510 Atherosclerotic heart disease of native coronary artery without angina pectoris: Secondary | ICD-10-CM | POA: Insufficient documentation

## 2018-01-21 DIAGNOSIS — N390 Urinary tract infection, site not specified: Secondary | ICD-10-CM | POA: Insufficient documentation

## 2018-01-26 ENCOUNTER — Telehealth (HOSPITAL_COMMUNITY): Payer: Self-pay

## 2018-01-26 ENCOUNTER — Encounter (HOSPITAL_COMMUNITY): Payer: Self-pay

## 2018-01-26 NOTE — Telephone Encounter (Signed)
2nd attempt to call patient in regards to Cardiac Rehab - lm on vm. Sending letter. °

## 2018-02-02 ENCOUNTER — Telehealth (HOSPITAL_COMMUNITY): Payer: Self-pay

## 2018-02-02 NOTE — Telephone Encounter (Signed)
Called and spoke with patient in regards to Cardiac Rehab - Patient stated she is not interested in the program as she is staying active by walking and exercising on her exercise bike at home. Closed referral.

## 2018-03-17 ENCOUNTER — Encounter: Payer: Self-pay | Admitting: Podiatry

## 2018-03-17 ENCOUNTER — Ambulatory Visit: Payer: Medicare Other | Admitting: Podiatry

## 2018-03-17 ENCOUNTER — Ambulatory Visit (INDEPENDENT_AMBULATORY_CARE_PROVIDER_SITE_OTHER): Payer: Medicare Other

## 2018-03-17 ENCOUNTER — Other Ambulatory Visit: Payer: Self-pay | Admitting: Podiatry

## 2018-03-17 DIAGNOSIS — L03119 Cellulitis of unspecified part of limb: Secondary | ICD-10-CM

## 2018-03-17 DIAGNOSIS — M79671 Pain in right foot: Secondary | ICD-10-CM | POA: Diagnosis not present

## 2018-03-17 DIAGNOSIS — M79672 Pain in left foot: Secondary | ICD-10-CM | POA: Diagnosis not present

## 2018-03-17 DIAGNOSIS — L02619 Cutaneous abscess of unspecified foot: Secondary | ICD-10-CM

## 2018-03-17 DIAGNOSIS — M779 Enthesopathy, unspecified: Secondary | ICD-10-CM

## 2018-03-17 DIAGNOSIS — L97511 Non-pressure chronic ulcer of other part of right foot limited to breakdown of skin: Secondary | ICD-10-CM

## 2018-03-17 MED ORDER — CEPHALEXIN 500 MG PO CAPS: 500.0000 mg | ORAL_CAPSULE | Freq: Three times a day (TID) | ORAL | 1 refills | Status: DC

## 2018-03-17 NOTE — Progress Notes (Signed)
Subjective:   [name], female   DOB: 77 y.o.   MRN: [id number]   HPI Patient presents stating she is developed a lot of redness in her second toe left and the outside of the right foot has been sore and she is had a lesion that her and her husband have been working on   ROS      Objective:  Physical Exam  Neurovascular status unchanged with redness of the left second toe with distal keratotic lesion with slight localized drainage with also some erythema around the right lateral foot with lesion on the fifth metatarsal head right that again is irritated with no proximal edema erythema or drainage noted     Assessment:  Probability that this is a low-grade infective process secondary to open wounds     Plan:  Debridement of all lesions occurring with I went ahead and flush the area instructed on soaks and applied Silvadene to the areas and placed on cephalexin 500 mg 3 times daily.  Gave strict instructions of any changes were to occur that we want to see her immediately  X-rays indicate no indications of ostial lysis or indications of bony condition

## 2018-04-09 ENCOUNTER — Encounter: Payer: Self-pay | Admitting: Podiatry

## 2018-04-09 ENCOUNTER — Ambulatory Visit: Payer: Medicare Other | Admitting: Podiatry

## 2018-04-09 DIAGNOSIS — L97511 Non-pressure chronic ulcer of other part of right foot limited to breakdown of skin: Secondary | ICD-10-CM | POA: Diagnosis not present

## 2018-04-09 DIAGNOSIS — M79672 Pain in left foot: Secondary | ICD-10-CM | POA: Diagnosis not present

## 2018-04-09 DIAGNOSIS — I70235 Atherosclerosis of native arteries of right leg with ulceration of other part of foot: Secondary | ICD-10-CM | POA: Diagnosis not present

## 2018-04-09 MED ORDER — CEPHALEXIN 500 MG PO CAPS: 500.0000 mg | ORAL_CAPSULE | Freq: Three times a day (TID) | ORAL | 0 refills | Status: DC

## 2018-04-12 NOTE — Progress Notes (Signed)
Subjective:   [name], female   DOB: 77 y.o.   MRN: [id number]   HPI Patient presents wanting to get ulceration checked on the right foot and states lost her toenail the second left with continued crusted discoloration   ROS      Objective:  Physical Exam  Patient has no change in vascular status currently or neurological with slight breakdown around the fifth metatarsal head right and irritation of the left second digit with loss of second nail.  Both of these are localized with no proximal edema erythema or no odor noted emitting from the area with slight breakdown of tissue that appears to be more irritated     Assessment:  Probability that this is related to pressure areas with patient having mild circulatory disease and a lot of stress on her feet secondary to the activity she must do in taking care of her son.  Patient has no indications of proximal irritation     Plan:  Conveyed findings to patient and have recommended at this time continue debridement well-padded shoes and soaks at home.  If symptoms were to persist or get worse she is to let us know immediately

## 2018-04-25 ENCOUNTER — Emergency Department (HOSPITAL_COMMUNITY): Payer: Medicare Other

## 2018-04-25 ENCOUNTER — Other Ambulatory Visit: Payer: Self-pay

## 2018-04-25 ENCOUNTER — Encounter (HOSPITAL_COMMUNITY): Payer: Self-pay

## 2018-04-25 ENCOUNTER — Inpatient Hospital Stay (HOSPITAL_COMMUNITY)
Admission: EM | Admit: 2018-04-25 | Discharge: 2018-04-27 | DRG: 246 | Disposition: A | Discharge status: Home / Self Care | Payer: Medicare Other | Attending: Cardiology | Admitting: Cardiology

## 2018-04-25 DIAGNOSIS — K219 Gastro-esophageal reflux disease without esophagitis: Secondary | ICD-10-CM | POA: Diagnosis present

## 2018-04-25 DIAGNOSIS — Z794 Long term (current) use of insulin: Secondary | ICD-10-CM | POA: Diagnosis not present

## 2018-04-25 DIAGNOSIS — I447 Left bundle-branch block, unspecified: Secondary | ICD-10-CM | POA: Diagnosis present

## 2018-04-25 DIAGNOSIS — Z8042 Family history of malignant neoplasm of prostate: Secondary | ICD-10-CM | POA: Diagnosis not present

## 2018-04-25 DIAGNOSIS — I130 Hypertensive heart and chronic kidney disease with heart failure and stage 1 through stage 4 chronic kidney disease, or unspecified chronic kidney disease: Secondary | ICD-10-CM | POA: Diagnosis present

## 2018-04-25 DIAGNOSIS — I5041 Acute combined systolic (congestive) and diastolic (congestive) heart failure: Secondary | ICD-10-CM

## 2018-04-25 DIAGNOSIS — E1143 Type 2 diabetes mellitus with diabetic autonomic (poly)neuropathy: Secondary | ICD-10-CM | POA: Diagnosis present

## 2018-04-25 DIAGNOSIS — Z96651 Presence of right artificial knee joint: Secondary | ICD-10-CM | POA: Diagnosis present

## 2018-04-25 DIAGNOSIS — E782 Mixed hyperlipidemia: Secondary | ICD-10-CM | POA: Diagnosis present

## 2018-04-25 DIAGNOSIS — Z803 Family history of malignant neoplasm of breast: Secondary | ICD-10-CM | POA: Diagnosis not present

## 2018-04-25 DIAGNOSIS — Z7902 Long term (current) use of antithrombotics/antiplatelets: Secondary | ICD-10-CM

## 2018-04-25 DIAGNOSIS — Z7982 Long term (current) use of aspirin: Secondary | ICD-10-CM

## 2018-04-25 DIAGNOSIS — K3184 Gastroparesis: Secondary | ICD-10-CM | POA: Diagnosis present

## 2018-04-25 DIAGNOSIS — E1142 Type 2 diabetes mellitus with diabetic polyneuropathy: Secondary | ICD-10-CM | POA: Diagnosis present

## 2018-04-25 DIAGNOSIS — Z955 Presence of coronary angioplasty implant and graft: Secondary | ICD-10-CM | POA: Diagnosis not present

## 2018-04-25 DIAGNOSIS — I5033 Acute on chronic diastolic (congestive) heart failure: Secondary | ICD-10-CM | POA: Diagnosis present

## 2018-04-25 DIAGNOSIS — E1122 Type 2 diabetes mellitus with diabetic chronic kidney disease: Secondary | ICD-10-CM | POA: Diagnosis present

## 2018-04-25 DIAGNOSIS — R0902 Hypoxemia: Secondary | ICD-10-CM | POA: Diagnosis present

## 2018-04-25 DIAGNOSIS — Z8249 Family history of ischemic heart disease and other diseases of the circulatory system: Secondary | ICD-10-CM | POA: Diagnosis not present

## 2018-04-25 DIAGNOSIS — I214 Non-ST elevation (NSTEMI) myocardial infarction: Secondary | ICD-10-CM | POA: Diagnosis present

## 2018-04-25 DIAGNOSIS — N183 Chronic kidney disease, stage 3 (moderate): Secondary | ICD-10-CM | POA: Diagnosis present

## 2018-04-25 DIAGNOSIS — R0603 Acute respiratory distress: Secondary | ICD-10-CM | POA: Diagnosis present

## 2018-04-25 DIAGNOSIS — J810 Acute pulmonary edema: Secondary | ICD-10-CM

## 2018-04-25 DIAGNOSIS — E039 Hypothyroidism, unspecified: Secondary | ICD-10-CM | POA: Diagnosis present

## 2018-04-25 HISTORY — DX: Atherosclerotic heart disease of native coronary artery without angina pectoris: I25.10

## 2018-04-25 HISTORY — DX: Acute combined systolic (congestive) and diastolic (congestive) heart failure: I50.41

## 2018-04-25 LAB — I-STAT ARTERIAL BLOOD GAS, ED
Acid-base deficit: 2 mmol/L (ref 0.0–2.0)
BICARBONATE: 23.7 mmol/L (ref 20.0–28.0)
O2 Saturation: 100 %
PO2 ART: 218 mmHg — AB (ref 83.0–108.0)
TCO2: 25 mmol/L (ref 22–32)
pCO2 arterial: 41.7 mmHg (ref 32.0–48.0)
pH, Arterial: 7.363 (ref 7.350–7.450)

## 2018-04-25 LAB — BRAIN NATRIURETIC PEPTIDE: B Natriuretic Peptide: 549.5 pg/mL — ABNORMAL HIGH (ref 0.0–100.0)

## 2018-04-25 LAB — COMPREHENSIVE METABOLIC PANEL
ALT: 14 U/L (ref 14–54)
AST: 16 U/L (ref 15–41)
Albumin: 3.6 g/dL (ref 3.5–5.0)
Alkaline Phosphatase: 97 U/L (ref 38–126)
Anion gap: 10 (ref 5–15)
BILIRUBIN TOTAL: 0.8 mg/dL (ref 0.3–1.2)
BUN: 17 mg/dL (ref 6–20)
CO2: 23 mmol/L (ref 22–32)
Calcium: 9.1 mg/dL (ref 8.9–10.3)
Chloride: 108 mmol/L (ref 101–111)
Creatinine, Ser: 1.27 mg/dL — ABNORMAL HIGH (ref 0.44–1.00)
GFR calc Af Amer: 46 mL/min — ABNORMAL LOW (ref >60)
GFR, EST NON AFRICAN AMERICAN: 40 mL/min — AB (ref >60)
Glucose, Bld: 364 mg/dL — ABNORMAL HIGH (ref 65–99)
POTASSIUM: 4.8 mmol/L (ref 3.5–5.1)
Sodium: 141 mmol/L (ref 135–145)
TOTAL PROTEIN: 7.3 g/dL (ref 6.5–8.1)

## 2018-04-25 LAB — I-STAT TROPONIN, ED
TROPONIN I, POC: 0.03 ng/mL (ref 0.00–0.08)
Troponin i, poc: 0.41 ng/mL (ref 0.00–0.08)

## 2018-04-25 LAB — CBC WITH DIFFERENTIAL/PLATELET
Abs Immature Granulocytes: 0 10*3/uL (ref 0.0–0.1)
Basophils Absolute: 0 10*3/uL (ref 0.0–0.1)
Basophils Relative: 1 %
EOS ABS: 0.2 10*3/uL (ref 0.0–0.7)
EOS PCT: 3 %
HEMATOCRIT: 40.8 % (ref 36.0–46.0)
Hemoglobin: 12.8 g/dL (ref 12.0–15.0)
Immature Granulocytes: 1 %
LYMPHS ABS: 1.1 10*3/uL (ref 0.7–4.0)
Lymphocytes Relative: 17 %
MCH: 29.6 pg (ref 26.0–34.0)
MCHC: 31.4 g/dL (ref 30.0–36.0)
MCV: 94.4 fL (ref 78.0–100.0)
Monocytes Absolute: 0.3 10*3/uL (ref 0.1–1.0)
Monocytes Relative: 5 %
Neutro Abs: 4.6 10*3/uL (ref 1.7–7.7)
Neutrophils Relative %: 73 %
Platelets: 156 10*3/uL (ref 150–400)
RBC: 4.32 MIL/uL (ref 3.87–5.11)
RDW: 13.7 % (ref 11.5–15.5)
WBC: 6.3 10*3/uL (ref 4.0–10.5)

## 2018-04-25 LAB — URINALYSIS, ROUTINE W REFLEX MICROSCOPIC
Bacteria, UA: NONE SEEN
Bilirubin Urine: NEGATIVE
Glucose, UA: >=500 mg/dL — AB
KETONES UR: NEGATIVE mg/dL
Leukocytes, UA: NEGATIVE
NITRITE: NEGATIVE
PH: 5 (ref 5.0–8.0)
Protein, ur: 100 mg/dL — AB
SPECIFIC GRAVITY, URINE: 1.018 (ref 1.005–1.030)

## 2018-04-25 LAB — I-STAT CHEM 8, ED
BUN: 19 mg/dL (ref 6–20)
CREATININE: 1.2 mg/dL — AB (ref 0.44–1.00)
Calcium, Ion: 1.14 mmol/L — ABNORMAL LOW (ref 1.15–1.40)
Chloride: 107 mmol/L (ref 101–111)
Glucose, Bld: 365 mg/dL — ABNORMAL HIGH (ref 65–99)
HEMATOCRIT: 39 % (ref 36.0–46.0)
HEMOGLOBIN: 13.3 g/dL (ref 12.0–15.0)
Potassium: 4.7 mmol/L (ref 3.5–5.1)
SODIUM: 141 mmol/L (ref 135–145)
TCO2: 22 mmol/L (ref 22–32)

## 2018-04-25 LAB — GLUCOSE, CAPILLARY: Glucose-Capillary: 168 mg/dL — ABNORMAL HIGH (ref 65–99)

## 2018-04-25 LAB — MRSA PCR SCREENING: MRSA by PCR: NEGATIVE

## 2018-04-25 LAB — TROPONIN I: Troponin I: 0.59 ng/mL (ref <0.03)

## 2018-04-25 MED ORDER — INSULIN ASPART 100 UNIT/ML ~~LOC~~ SOLN
0.0000 [IU] | Freq: Three times a day (TID) | SUBCUTANEOUS | Status: DC
  Administered 2018-04-27: 07:00:00 5 [IU] via SUBCUTANEOUS

## 2018-04-25 MED ORDER — POTASSIUM CHLORIDE CRYS ER 20 MEQ PO TBCR
20.0000 meq | EXTENDED_RELEASE_TABLET | Freq: Two times a day (BID) | ORAL | Status: DC
  Administered 2018-04-27: 20 meq via ORAL
  Filled 2018-04-25: qty 1

## 2018-04-25 MED ORDER — OLMESARTAN MEDOXOMIL-HCTZ 40-25 MG PO TABS: 1.0000 | ORAL_TABLET | Freq: Every day | ORAL | Status: DC

## 2018-04-25 MED ORDER — FUROSEMIDE 10 MG/ML IJ SOLN
40.0000 mg | Freq: Once | INTRAMUSCULAR | Status: AC
  Administered 2018-04-25: 40 mg via INTRAVENOUS
  Filled 2018-04-25: qty 4

## 2018-04-25 MED ORDER — SODIUM CHLORIDE 0.9% FLUSH
3.0000 mL | Freq: Two times a day (BID) | INTRAVENOUS | Status: DC
  Administered 2018-04-25: 3 mL via INTRAVENOUS

## 2018-04-25 MED ORDER — DAPAGLIFLOZIN PRO-METFORMIN ER 10-1000 MG PO TB24: 1.0000 | ORAL_TABLET | Freq: Every day | ORAL | Status: DC

## 2018-04-25 MED ORDER — FUROSEMIDE 10 MG/ML IJ SOLN
40.0000 mg | Freq: Three times a day (TID) | INTRAMUSCULAR | Status: DC
  Administered 2018-04-25: 40 mg via INTRAVENOUS
  Filled 2018-04-25 (×2): qty 4

## 2018-04-25 MED ORDER — AMLODIPINE BESYLATE 5 MG PO TABS
5.0000 mg | ORAL_TABLET | Freq: Every day | ORAL | Status: DC
  Administered 2018-04-26 – 2018-04-27 (×2): 5 mg via ORAL
  Filled 2018-04-25 (×2): qty 1

## 2018-04-25 MED ORDER — ONDANSETRON HCL 4 MG/2ML IJ SOLN
4.0000 mg | Freq: Four times a day (QID) | INTRAMUSCULAR | Status: DC | PRN
  Administered 2018-04-26: 4 mg via INTRAVENOUS
  Filled 2018-04-25: qty 2

## 2018-04-25 MED ORDER — SODIUM CHLORIDE 0.9% FLUSH: 3.0000 mL | INTRAVENOUS | Status: DC | PRN

## 2018-04-25 MED ORDER — HEPARIN (PORCINE) IN NACL 100-0.45 UNIT/ML-% IJ SOLN
1000.0000 [IU]/h | INTRAMUSCULAR | Status: DC
  Administered 2018-04-25: 1000 [IU]/h via INTRAVENOUS
  Administered 2018-04-26: 1200 [IU]/h via INTRAVENOUS
  Filled 2018-04-25: qty 250

## 2018-04-25 MED ORDER — NITROGLYCERIN 0.4 MG SL SUBL: 0.4000 mg | SUBLINGUAL_TABLET | SUBLINGUAL | Status: DC | PRN

## 2018-04-25 MED ORDER — NITROGLYCERIN IN D5W 200-5 MCG/ML-% IV SOLN
0.0000 ug/min | Freq: Once | INTRAVENOUS | Status: AC
  Administered 2018-04-25: 5 ug/min via INTRAVENOUS
  Filled 2018-04-25: qty 250

## 2018-04-25 MED ORDER — SODIUM CHLORIDE 0.9 % IV SOLN: 250.0000 mL | INTRAVENOUS | Status: DC | PRN

## 2018-04-25 MED ORDER — MAGNESIUM OXIDE 400 (241.3 MG) MG PO TABS
800.0000 mg | ORAL_TABLET | Freq: Every day | ORAL | Status: DC
  Administered 2018-04-26 – 2018-04-27 (×2): 800 mg via ORAL
  Filled 2018-04-25 (×2): qty 2

## 2018-04-25 MED ORDER — HEPARIN BOLUS VIA INFUSION
4000.0000 [IU] | Freq: Once | INTRAVENOUS | Status: AC
  Administered 2018-04-25: 4000 [IU] via INTRAVENOUS
  Filled 2018-04-25: qty 4000

## 2018-04-25 MED ORDER — ASPIRIN 325 MG PO TABS: 325.0000 mg | ORAL_TABLET | Freq: Once | ORAL | Status: DC

## 2018-04-25 MED ORDER — ROSUVASTATIN CALCIUM 20 MG PO TABS
40.0000 mg | ORAL_TABLET | Freq: Every day | ORAL | Status: DC
  Administered 2018-04-26: 40 mg via ORAL
  Filled 2018-04-25: qty 2

## 2018-04-25 MED ORDER — CLOPIDOGREL BISULFATE 75 MG PO TABS
75.0000 mg | ORAL_TABLET | Freq: Every day | ORAL | Status: DC
  Administered 2018-04-26: 75 mg via ORAL
  Filled 2018-04-25: qty 1

## 2018-04-25 MED ORDER — SODIUM CHLORIDE 0.9% FLUSH
3.0000 mL | Freq: Two times a day (BID) | INTRAVENOUS | Status: DC
  Administered 2018-04-26: 3 mL via INTRAVENOUS

## 2018-04-25 MED ORDER — EZETIMIBE 10 MG PO TABS
10.0000 mg | ORAL_TABLET | Freq: Every day | ORAL | Status: DC
  Administered 2018-04-27: 08:00:00 10 mg via ORAL
  Filled 2018-04-25: qty 1

## 2018-04-25 MED ORDER — OMEGA-3-ACID ETHYL ESTERS 1 G PO CAPS: 1.0000 g | ORAL_CAPSULE | Freq: Two times a day (BID) | ORAL | Status: DC

## 2018-04-25 MED ORDER — PANTOPRAZOLE SODIUM 40 MG PO TBEC
40.0000 mg | DELAYED_RELEASE_TABLET | Freq: Two times a day (BID) | ORAL | Status: DC
  Administered 2018-04-26 – 2018-04-27 (×3): 40 mg via ORAL
  Filled 2018-04-25 (×3): qty 1

## 2018-04-25 MED ORDER — INSULIN ASPART 100 UNIT/ML ~~LOC~~ SOLN: 0.0000 [IU] | Freq: Every day | SUBCUTANEOUS | Status: DC

## 2018-04-25 MED ORDER — BUSPIRONE HCL 5 MG PO TABS
7.5000 mg | ORAL_TABLET | Freq: Two times a day (BID) | ORAL | Status: DC
  Administered 2018-04-27: 7.5 mg via ORAL
  Filled 2018-04-25 (×3): qty 1.5

## 2018-04-25 MED ORDER — NITROGLYCERIN IN D5W 200-5 MCG/ML-% IV SOLN: 0.0000 ug/min | INTRAVENOUS | Status: DC

## 2018-04-25 MED ORDER — SODIUM CHLORIDE 0.9 % IV SOLN
INTRAVENOUS | Status: DC
  Administered 2018-04-26: 15:00:00 via INTRAVENOUS

## 2018-04-25 MED ORDER — LEVOTHYROXINE SODIUM 50 MCG PO TABS
50.0000 ug | ORAL_TABLET | Freq: Every day | ORAL | Status: DC
  Administered 2018-04-26 – 2018-04-27 (×2): 50 ug via ORAL
  Filled 2018-04-25 (×2): qty 1

## 2018-04-25 MED ORDER — ASPIRIN EC 81 MG PO TBEC
81.0000 mg | DELAYED_RELEASE_TABLET | Freq: Every day | ORAL | Status: DC
  Administered 2018-04-26 – 2018-04-27 (×2): 81 mg via ORAL
  Filled 2018-04-25 (×2): qty 1

## 2018-04-25 NOTE — ED Notes (Signed)
Attempted report to tele floor, floor stated pt was not appropriate for unit, pt to be changed to SDU

## 2018-04-25 NOTE — ED Notes (Signed)
BiPAP removed, pt on non rebreather at 15L, resp e/u, oxygen saturation maintaining 98-100%

## 2018-04-25 NOTE — H&P (Signed)
Sara Graves is an 77 y.o. female.   Chief Complaint: Shortness of breath and chest tightness HPI: Sara Graves  is a 77 y.o. female  With uncontrolled diabetes mellitus, diabetic gastroparesis, hypertension, hyperlipidemia, rectovaginal fistula, recurrent episodes of acute on chronic diastolic heart failure and has EF 40-45% by echocardiogram in February 2018 along with nuclear stress test revealing inferior and inferolateral scar with EF of 33%. She underwent coronary angiography on 12/30/2017 revealing high-grade ostial 80% stenosis and small to moderate-sized ramus bifurcation 80-90% % stenosis S/P Resolute Onyx 3.0 X 15 mm drug-eluting stent to ostial LAD.  She had been doing well until this morning woke up with chest tightness associated with marked dyspnea.  She came via EMS to the emergency room with acute respiratory distress needing BiPAP and aggressive resuscitation.  Patient was started on IV nitroglycerin along with intravenous furosemide and was placed on BiPAP and felt well and improved.  Chest pain symptoms resolved, initially on presentation she had left bundle branch block which resolved into LVH with repolarization abnormality.  I was called to see the patient.  Patient still in mild respiratory distress but states that she is feeling much better.  She is oxygenating well, denies any further chest discomfort, no palpitations, dizziness or syncope.  She has chronic leg edema and states that she has been careful and watching her diet carefully.  No recent weight changes.  Past Medical History:  Diagnosis Date  . Anxiety   . Arthritis    "hands" (12/29/2017)  . Basal cell carcinoma of nose    removed  . Bursitis of left shoulder   . Cat scratch fever    Late 90s  . Depression   . Diabetes mellitus, type II, insulin dependent ([place])   . GERD (gastroesophageal reflux disease)   . H. pylori infection 2008 and 1998   treated  . Hyperlipidemia   . Hypertension   .  Hypothyroidism   . Multinodular goiter   . Rotator cuff tear, left recurrent   . Urge urinary incontinence   . UTI (lower urinary tract infection) 05/2016    Past Surgical History:  Procedure Laterality Date  . APPENDECTOMY  AGE 24  . BACK SURGERY    . BASAL CELL CARCINOMA EXCISION     "nose"  . BLADDER NECK SUSPENSION  1970's  . BUNIONECTOMY WITH HAMMERTOE RECONSTRUCTION Bilateral   . CARPAL TUNNEL RELEASE Right 05/2017  . COLONOSCOPY W/ POLYPECTOMY    . CORONARY ANGIOPLASTY WITH STENT PLACEMENT  12/29/2017  . CORONARY STENT INTERVENTION N/A 12/29/2017   Procedure: CORONARY STENT INTERVENTION;  Surgeon: Nigel Mormon, MD;  Location: [facility];  Service: Cardiovascular;  Laterality: N/A;  . ESOPHAGOGASTRODUODENOSCOPY ENDOSCOPY    . FORAMINAL DECOMPRESSION AT L2 TO THE SACRUM  01-05-2008   L2  -  S1  . GANGLION CYST EXCISION Left 01/17/2009   ring finger  . IMPLANTATION PERMANENT SPINAL CORD STIMULATOR  06-15-2008   JUNE 2013--  BATTERY CHANGE  . JOINT REPLACEMENT    . LIPOMA EXCISION Right    RIGHT ELBOW  . RIGHT/LEFT HEART CATH AND CORONARY ANGIOGRAPHY N/A 12/29/2017   Procedure: RIGHT/LEFT HEART CATH AND CORONARY ANGIOGRAPHY;  Surgeon: Nigel Mormon, MD;  Location: [facility];  Service: Cardiovascular;  Laterality: N/A;  . SHOULDER OPEN ROTATOR CUFF REPAIR  09/07/2012   Procedure: ROTATOR CUFF REPAIR SHOULDER OPEN;  Surgeon: Magnus Sinning, MD;  Location: [facility];  Service: Orthopedics;  Laterality: Left;  OPEN ANTERIOR ACROMIONECTOMY AND ROTATOR CUFF REPAIR ON LEFT   . SHOULDER OPEN ROTATOR CUFF REPAIR Left 12/28/2012   Procedure: ROTATOR CUFF REPAIR SHOULDER OPEN;  Surgeon: Magnus Sinning, MD;  Location: [facility];  Service: Orthopedics;  Laterality: Left;  OPEN SHOULDER ROTATOR CUFF REPAIR ON LEFT  WITH ANTERIOR ACROMINECTOMY   . SHOULDER OPEN ROTATOR CUFF REPAIR Right 03/28/2003   RIGHT SHOULDER   DEGENERATIVE AC JOINT AND RC TEAR  . SPINAL CORD STIMULATOR BATTERY EXCHANGE N/A 07/03/2016   Procedure: SPINAL CORD STIMULATOR BATTERY PLACMENT;  Surgeon: Melina Schools, MD;  Location: [facility];  Service: Orthopedics;  Laterality: N/A;  . TONSILLECTOMY  AGE 29  . 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 VAGINAL HYSTERECTOMY  1970's    Family History  Problem Relation Age of Onset  . Cancer Mother        breast  . Heart Problems Mother   . Prostate cancer Father   . Muscular dystrophy Son   . Cancer Maternal Grandmother        breast  . Heart disease Maternal Grandfather   . Breast cancer Sister    Social History:  reports that she has never smoked. She has never used smokeless tobacco. She reports that she does not drink alcohol or use drugs.  Allergies:  Allergies  Allergen Reactions  . Rosiglitazone Maleate Anaphylaxis and Swelling      (ANTIDIABETIC MED.) All over body   . Morphine Other (See Comments)    SEVERE HYPOTENSION   . Sulfa Antibiotics     Diarrhea, nausea and vomiting.  [name] [Amitriptyline] Nausea Only  . Tramadol-Acetaminophen Rash   Review of Systems  Constitutional: Positive for malaise/fatigue. Negative for chills, diaphoresis, fever and weight loss.  HENT: Negative.   Eyes: Negative.   Respiratory: Positive for cough, shortness of breath and wheezing. Negative for hemoptysis and sputum production.   Cardiovascular: Positive for chest pain, orthopnea, leg swelling and PND. Negative for palpitations and claudication.  Gastrointestinal: Negative.   Genitourinary: Negative.   Musculoskeletal: Negative.   Skin: Negative.   Neurological: Negative.   Endo/Heme/Allergies: Negative.   Psychiatric/Behavioral: Positive for depression.  All other systems reviewed and are negative.  Blood pressure 136/71, pulse 90, temperature 98 F (36.7 C), temperature source Axillary, resp. rate (!) 26, height 5' 4"  (1.626 m), weight 86.2 kg (190 lb),  SpO2 91 %. Body mass index is 32.61 kg/m.  Physical Exam  Constitutional: She is oriented to person, place, and time. She appears well-developed and well-nourished. She appears distressed (MILD RESPIRATORY).  Eyes: Conjunctivae are normal.  Neck: Normal range of motion. JVD present.  Cardiovascular:  S1 and S2 is normal, there is a 1/6 to 2/6 systolic murmur, early, in the apex.  No gallop appreciated.  Distant heart sounds.  Pulmonary/Chest: She is in respiratory distress (Mild). She has wheezes. She has rales. She exhibits no tenderness.  Abdominal: Soft. Bowel sounds are normal.  Musculoskeletal: She exhibits edema (2+ bilateral pitting edema, much improved from when patient was seen in the office 2 weeks ago.).  Lymphadenopathy:    She has no cervical adenopathy.  Neurological: She is alert and oriented to person, place, and time.  Skin: Skin is warm and dry. She is not diaphoretic.  Psychiatric: She has a normal mood and affect.    Results for orders placed or performed during the hospital encounter of 04/25/18 (from the past 48 hour(s))  Urinalysis, Routine w reflex microscopic     Status: Abnormal   Collection Time: 04/25/18 12:46 PM  Result Value Ref Range   Color, Urine STRAW (A) YELLOW   APPearance CLEAR CLEAR   Specific Gravity, Urine 1.018 1.005 - 1.030   pH 5.0 5.0 - 8.0   Glucose, UA >=500 (A) NEGATIVE mg/dL   Hgb urine dipstick SMALL (A) NEGATIVE   Bilirubin Urine NEGATIVE NEGATIVE   Ketones, ur NEGATIVE NEGATIVE mg/dL   Protein, ur 100 (A) NEGATIVE mg/dL   Nitrite NEGATIVE NEGATIVE   Leukocytes, UA NEGATIVE NEGATIVE   RBC / HPF 0-5 0 - 5 RBC/hpf   Bacteria, UA NONE SEEN NONE SEEN   Squamous Epithelial / LPF 0-5 0 - 5    Comment: Performed at [facility], [address], [place], [place] [address]  CBC with Differential/Platelet     Status: None   Collection Time: 04/25/18 12:56 PM  Result Value Ref Range   WBC 6.3 4.0 - 10.5 K/uL   RBC 4.32 3.87 - 5.11  MIL/uL   Hemoglobin 12.8 12.0 - 15.0 g/dL   HCT 40.8 36.0 - 46.0 %   MCV 94.4 78.0 - 100.0 fL   MCH 29.6 26.0 - 34.0 pg   MCHC 31.4 30.0 - 36.0 g/dL   RDW 13.7 11.5 - 15.5 %   Platelets 156 150 - 400 K/uL   Neutrophils Relative % 73 %   Neutro Abs 4.6 1.7 - 7.7 K/uL   Lymphocytes Relative 17 %   Lymphs Abs 1.1 0.7 - 4.0 K/uL   Monocytes Relative 5 %   Monocytes Absolute 0.3 0.1 - 1.0 K/uL   Eosinophils Relative 3 %   Eosinophils Absolute 0.2 0.0 - 0.7 K/uL   Basophils Relative 1 %   Basophils Absolute 0.0 0.0 - 0.1 K/uL   Immature Granulocytes 1 %   Abs Immature Granulocytes 0.0 0.0 - 0.1 K/uL    Comment: Performed at [facility] [address], [place], [place] [address]  Comprehensive metabolic panel     Status: Abnormal   Collection Time: 04/25/18 12:56 PM  Result Value Ref Range   Sodium 141 135 - 145 mmol/L   Potassium 4.8 3.5 - 5.1 mmol/L   Chloride 108 101 - 111 mmol/L   CO2 23 22 - 32 mmol/L   Glucose, Bld 364 (H) 65 - 99 mg/dL   BUN 17 6 - 20 mg/dL   Creatinine, Ser 1.27 (H) 0.44 - 1.00 mg/dL   Calcium 9.1 8.9 - 10.3 mg/dL   Total Protein 7.3 6.5 - 8.1 g/dL   Albumin 3.6 3.5 - 5.0 g/dL   AST 16 15 - 41 U/L   ALT 14 14 - 54 U/L   Alkaline Phosphatase 97 38 - 126 U/L   Total Bilirubin 0.8 0.3 - 1.2 mg/dL   GFR calc non Af Amer 40 (L) >60 mL/min   GFR calc Af Amer 46 (L) >60 mL/min    Comment: (NOTE) The eGFR has been calculated using the CKD EPI equation. This calculation has not been validated in all clinical situations. eGFR's persistently <60 mL/min signify possible Chronic Kidney Disease.    Anion gap 10 5 - 15    Comment: Performed at [facility] [address] [place],  [address]  Brain natriuretic peptide     Status: Abnormal   Collection Time: 04/25/18 12:56 PM  Result Value Ref Range   B Natriuretic Peptide 549.5 (H) 0.0 -  100.0 pg/mL    Comment: Performed at [facility], [place] [address], [place],  [address]   I-stat troponin, ED     Status: None   Collection Time: 04/25/18  1:05 PM  Result Value Ref Range   Troponin i, poc 0.03 0.00 - 0.08 ng/mL   Comment 3            Comment: Due to the release kinetics of cTnI, a negative result within the first hours of the onset of symptoms does not rule out myocardial infarction with certainty. If myocardial infarction is still suspected, repeat the test at appropriate intervals.   I-stat chem 8, ed     Status: Abnormal   Collection Time: 04/25/18  1:06 PM  Result Value Ref Range   Sodium 141 135 - 145 mmol/L   Potassium 4.7 3.5 - 5.1 mmol/L   Chloride 107 101 - 111 mmol/L   BUN 19 6 - 20 mg/dL   Creatinine, Ser 1.20 (H) 0.44 - 1.00 mg/dL   Glucose, Bld 365 (H) 65 - 99 mg/dL   Calcium, Ion 1.14 (L) 1.15 - 1.40 mmol/L   TCO2 22 22 - 32 mmol/L   Hemoglobin 13.3 12.0 - 15.0 g/dL   HCT 39.0 36.0 - 46.0 %  I-Stat arterial blood gas, ED     Status: Abnormal   Collection Time: 04/25/18  1:42 PM  Result Value Ref Range   pH, Arterial 7.363 7.350 - 7.450   pCO2 arterial 41.7 32.0 - 48.0 mmHg   pO2, Arterial 218.0 (H) 83.0 - 108.0 mmHg   Bicarbonate 23.7 20.0 - 28.0 mmol/L   TCO2 25 22 - 32 mmol/L   O2 Saturation 100.0 %   Acid-base deficit 2.0 0.0 - 2.0 mmol/L   Patient temperature 98.6 F    Collection site RADIAL, ALLEN'S TEST ACCEPTABLE    Drawn by Operator    Sample type ARTERIAL   I-stat troponin, ED     Status: Abnormal   Collection Time: 04/25/18  3:13 PM  Result Value Ref Range   Troponin i, poc 0.41 (HH) 0.00 - 0.08 ng/mL   Comment NOTIFIED PHYSICIAN    Comment 3            Comment: Due to the release kinetics of cTnI, a negative result within the first hours of the onset of symptoms does not rule out myocardial infarction with certainty. If myocardial infarction is still suspected, repeat the test at appropriate intervals.     Labs:   Lab Results  Component Value Date   WBC 6.3 04/25/2018   HGB 13.3 04/25/2018   HCT 39.0  04/25/2018   MCV 94.4 04/25/2018   PLT 156 04/25/2018   Recent Labs  Lab 04/25/18 1256 04/25/18 1306  NA 141 141  K 4.8 4.7  CL 108 107  CO2 23  --   BUN 17 19  CREATININE 1.27* 1.20*  CALCIUM 9.1  --   PROT 7.3  --   BILITOT 0.8  --   ALKPHOS 97  --   ALT 14  --   AST 16  --   GLUCOSE 364* 365*    Lipid Panel     Component Value Date/Time   CHOL 178 03/25/2015 0325   TRIG 116 03/25/2015 0325   HDL 46 03/25/2015 0325   CHOLHDL 3.9 03/25/2015 0325   VLDL 23 03/25/2015 0325   LDLCALC 109 (H) 03/25/2015 0325    BNP (last 3 results) Recent Labs  04/25/18 1256  BNP 549.5*    HEMOGLOBIN A1C Lab Results  Component Value Date   HGBA1C 9.6 (H) 06/27/2016   MPG 229 06/27/2016   Troponin (Point of Care Test) Recent Labs    04/25/18 1513  TROPIPOC 0.41*     Current Facility-Administered Medications:  .  0.9 %  sodium chloride infusion, 250 mL, Intravenous, PRN, Adrian Prows, MD .  Derrill Memo ON 04/26/2018] aspirin EC tablet 81 mg, 81 mg, Oral, Daily, Adrian Prows, MD .  furosemide (LASIX) tablet 40 mg, 40 mg, Oral, Once, Shawnee Knapp, MD .  heparin ADULT infusion 100 units/mL (25000 units/240m sodium chloride 0.45%), 1,000 Units/hr, Intravenous, Continuous, [place], BDarnell Level RPH, Last Rate: 10 mL/hr at 04/25/18 1655, 1,000 Units/hr at 04/25/18 1655 .  nitroGLYCERIN (NITROSTAT) SL tablet 0.4 mg, 0.4 mg, Sublingual, Q5 Min x 3 PRN, GAdrian Prows MD .  nitroGLYCERIN 50 mg in dextrose 5 % 250 mL (0.2 mg/mL) infusion, 0-30 mcg/min, Intravenous, Titrated, GAdrian Prows MD .  ondansetron ([facility] injection 4 mg, 4 mg, Intravenous, Q6H PRN, GAdrian Prows MD .  sodium chloride flush (NS) 0.9 % injection 3 mL, 3 mL, Intravenous, Q12H, GAdrian Prows MD .  sodium chloride flush (NS) 0.9 % injection 3 mL, 3 mL, Intravenous, PRN, GAdrian Prows MD  Current Outpatient Medications:  .  Dapagliflozin-metFORMIN HCl ER 08-999 MG TB24, Take 1 tablet by mouth daily., Disp: , Rfl:  .  amLODipine  (NORVASC) 5 MG tablet, Take 1 tablet (5 mg total) by mouth daily., Disp: 60 tablet, Rfl: 1 .  aspirin ([facility] ASPIRIN) 81 MG chewable tablet, Chew 81 mg by mouth daily. , Disp: , Rfl:  .  busPIRone (BUSPAR) 7.5 MG tablet, Take 7.5 mg by mouth 2 (two) times daily., Disp: , Rfl:  .  Calcium Carb-Cholecalciferol (CALCIUM 600 + D PO), Take 1 tablet by mouth daily., Disp: , Rfl:  .  cefpodoxime (VANTIN) 200 MG tablet, TAKE ONE TABLET (200 MG DOSE) BY MOUTH 2 (TWO) TIMES DAILY., Disp: , Rfl: 0 .  cephALEXin (KEFLEX) 500 MG capsule, Take 1 capsule (500 mg total) by mouth 3 (three) times daily., Disp: 30 capsule, Rfl: 0 .  clopidogrel (PLAVIX) 75 MG tablet, Take 1 tablet (75 mg total) by mouth daily with breakfast., Disp: 60 tablet, Rfl: 3 .  ezetimibe (ZETIA) 10 MG tablet, Take 10 mg by mouth daily., Disp: , Rfl:  .  furosemide (LASIX) 20 MG tablet, 2 tabs po qam and 1 tab po qlunch. F/u with PCP for any further refills. (Patient taking differently: Take 20 mg by mouth daily as needed for edema. ), Disp: 90 tablet, Rfl: 0 .  HUMALOG 100 UNIT/ML injection, , Disp: , Rfl:  .  insulin aspart (NOVOLOG) 100 UNIT/ML injection, Inject into the skin See admin instructions. Pt has VDelight Disp: , Rfl:  .  isosorbide mononitrate (IMDUR) 60 MG 24 hr tablet, Take 60 mg by mouth daily., Disp: , Rfl:  .  levothyroxine (SYNTHROID, LEVOTHROID) 50 MCG tablet, Take 50 mcg by mouth every morning. , Disp: , Rfl:  .  olmesartan-hydrochlorothiazide (BENICAR HCT) 40-25 MG tablet, Take 1 tablet by mouth daily., Disp: , Rfl:  .  Omega-3 Fatty Acids (FISH OIL) 1000 MG CAPS, Take 1,000 mg by mouth 2 (two) times daily. OTC fish oil, Disp: , Rfl:  .  ondansetron (ZOFRAN) 4 MG tablet, TAKE ONE TABLET (4 MG DOSE) BY MOUTH EVERY 8 (EIGHT) HOURS AS NEEDED FOR NAUSEA., Disp: , Rfl:  0 .  pantoprazole (PROTONIX) 40 MG tablet, Take 1 tablet (40 mg total) by mouth 2 (two) times daily., Disp: 60 tablet, Rfl: 1 .  potassium  chloride SA (KLOR-CON M20) 20 MEQ tablet, Take 1 tablet (20 mEq total) by mouth 2 (two) times daily. With lasix. F/u with PCP for any additional refills. (Patient taking differently: Take 20 mEq by mouth daily as needed. With lasix. F/u with PCP for any additional refills.), Disp: 60 tablet, Rfl: 0 .  rosuvastatin (CRESTOR) 40 MG tablet, Take 1 tablet (40 mg total) by mouth at bedtime., Disp: 60 tablet, Rfl: 3 .  torsemide (DEMADEX) 20 MG tablet, TAKE 1 TABLET DAILY AS NEEDED FOR LEG EDEMA, Disp: , Rfl: 3 .  valsartan-hydrochlorothiazide (DIOVAN-HCT) 320-12.5 MG tablet, Take 1 tablet by mouth daily., Disp: , Rfl:  .  Vitamin D, Ergocalciferol, (DRISDOL) 50000 units CAPS capsule, Take 50,000 Units by mouth every 7 (seven) days., Disp: , Rfl:   CARDIAC STUDIES:  EKG initial EKG at presentation on 04/26/2018 reveals normal sinus rhythm with left bundle branch block.  Repeat EKG reveals normal sinus rhythm with LVH with repolarization abnormality unchanged from prior EKG.  Echocardiogram 12/23/2016: Left ventricle cavity is mildly dilated. Mild concentric hypertrophy of the left ventricle. Diffuse hypokinesis. Mild to moderate decrease in LV systolic function. Cannot exclude inferolateral hypokinesis. Poor apical views. Estimated LVEF 40-45% Doppler evidence of grade I (impaired) diastolic dysfunction, elevated LAP. Left atrial cavity is mildly dilated at 4 cm, but moderate to severely dilated by volume. Trace aortic regurgitation. Mild (Grade I) mitral regurgitation. Mild to moderate tricuspid regurgitation. Borderline mild pulmonary hypertension. Pulmonary artery systolic pressure is estimated at 29 mm Hg.  Coronary angiogram 12/29/2017: Severe two vessel vessel coronary artery disease LM: Normal Ostial LAD: 80% Ramus bifurcation 75% LCx: Mild disease RCA: Mild disease  Normal filling pressures No pulmonary hypertension  Successful percutaneous coronary intervention Ostial LAD        PTCA  and stent placement Resolute Onyx 3.0 X 15 mm drug-eluting stent Unsuccessful PTCA Ramus intermedius   Assessment/Plan 1.  Acute coronary syndrome, probably non-STEMI, intermittent LBBB now resolved. 2.  Acute on chronic diastolic heart failure with respiratory distress and  pulmonary edema 3.  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  Hypertension 5.  Hyperlipidemia 6. Patient enrolled in DELIVER Trial (HFpEF- Dapagliflozin-Metformin).  Recommendation: Patient will be diuresed aggressively tonight and hold diuretics after 2 doses, given her coronary anatomy and presentation with acute onset chest tightness, shortness of breath and elevated serum troponin, suspect ACS, will also need coronary angiography in the morning to reevaluate her coronary anatomy.  I have discussed with the patient regarding risks, benefits and alternatives and she is willing to proceed.  I will call her husband at home and discuss her presentation.  She will be admitted to the stepdown unit, I will continue IV nitroglycerin along with IV heparin.  We will probably switch from Plavix to Brilinta.  She is presently enrolled in the clinical trial for heart failure with preserved ejection fraction, with SGLT 1-2 inhibitor, continue study drug for now. Discussed risks, benefits and alternatives of angiogram including but not limited to <1% risk of death, stroke, MI, need for urgent surgical revascularization, renal failure, but not limited to thest. patient is willing to proceed.  Adrian Prows, MD 04/25/2018, 5:08 PM [facility]. [place] Pager: [phone] Office: [phone] If no answer: Cell:  [phone]

## 2018-04-25 NOTE — ED Triage Notes (Signed)
Pt from home with ems for respiratory distress and chest pain since 9am this morning. Pt was found to be 62% on room air, CPAP applied and saturations increased to 94%, rales in all lung fields. Pt alert and oriented upon arrival.  BP 210/110 Hr 110-120 sinus tach RR40

## 2018-04-25 NOTE — ED Notes (Signed)
Respiratory at bedside upon pt arrival to place BiPAP

## 2018-04-25 NOTE — Progress Notes (Signed)
ANTICOAGULATION CONSULT NOTE - Initial Consult  Pharmacy Consult for Heparin Indication: chest pain/ACS  Allergies  Allergen Reactions  . Rosiglitazone Maleate Anaphylaxis and Swelling      (ANTIDIABETIC MED.) All over body   . Morphine Other (See Comments)    SEVERE HYPOTENSION   . Sulfa Antibiotics     Diarrhea, nausea and vomiting.  [name] [Amitriptyline] Nausea Only  . Tramadol-Acetaminophen Rash    Patient Measurements: Height: 5\' 4"  (162.6 cm) Weight: 190 lb (86.2 kg) IBW/kg (Calculated) : 54.7 Heparin Dosing Weight: 76 kg   Vital Signs: Temp: 98 F (36.7 C) (06/09 1252) Temp Source: Axillary (06/09 1252) BP: 160/69 (06/09 1500) Pulse Rate: 85 (06/09 1500)  Labs: Recent Labs    04/25/18 1256 04/25/18 1306  HGB 12.8 13.3  HCT 40.8 39.0  PLT 156  --   CREATININE 1.27* 1.20*    Estimated Creatinine Clearance: 42.4 mL/min (A) (by C-G formula based on SCr of 1.2 mg/dL (H)).   Medical History: Past Medical History:  Diagnosis Date  . Anxiety   . Arthritis    "hands" (12/29/2017)  . Basal cell carcinoma of nose    removed  . Bursitis of left shoulder   . Cat scratch fever    Late 90s  . Depression   . Diabetes mellitus, type II, insulin dependent ([place])   . GERD (gastroesophageal reflux disease)   . H. pylori infection 2008 and 1998   treated  . Hyperlipidemia   . Hypertension   . Hypothyroidism   . Multinodular goiter   . Rotator cuff tear, left recurrent   . Urge urinary incontinence   . UTI (lower urinary tract infection) 05/2016    Medications:   (Not in a hospital admission)  Assessment: 55 YOF who is here with shortness of breath. Troponin elevated. Pharmacy consulted to start IV heparin for ACS. H/H and Plt wnl. Patient is not on any anticoagulation prior to admission.   Goal of Therapy:  Heparin level 0.3-0.7 units/ml Monitor platelets by anticoagulation protocol: Yes   Plan:  -Heparin 4000 units IV bolus, then start IV heparin  at 1000 units/hr -F/u 8 hr HL -Monitor CBC, daily HL and s/s of bleeding   Albertina Parr, PharmD., BCPS Clinical Pharmacist Clinical phone for 04/25/18 until 3:30pm: [phone] If after 3:30pm, please call main pharmacy at: [phone]

## 2018-04-25 NOTE — ED Notes (Signed)
Dr. Virgina Jock paged to [place], RN-paged by Levada Dy

## 2018-04-25 NOTE — ED Notes (Signed)
Report called to nurse on 6e

## 2018-04-25 NOTE — ED Notes (Signed)
Heparin verified by ryland rn

## 2018-04-25 NOTE — ED Notes (Signed)
Pt has tele bed assigned but because thew nitro was titrated for bp  The tele floor will not take  Call to admitting doctor for a bed change

## 2018-04-25 NOTE — ED Provider Notes (Addendum)
[facility] EMERGENCY DEPARTMENT Provider Note   CSN: [id number] Arrival date & time: 04/25/18  1240     History   Chief Complaint Chief Complaint  Patient presents with  . Respiratory Distress  . Chest Pain    HPI Sara Graves is a 77 y.o. female hx of diabetes, MI status post stent on Plavix, here presenting with shortness of breath.  Patient was feeling fine yesterday but this morning woke up with acute onset of shortness of breath.  Patient states that she felt like she could not catch her breath.  Patient also has progressive bilateral leg swelling for the last week or so.  Patient denies any chest pain prior to this morning.  Patient was noted to be hypoxic 60% on room air per EMS and is not on oxygen at home.  She was put on CPAP and the oxygen was about 95% on arrival.  Patient states that she has a nonproductive cough but denies any fevers over the last several days.  The history is provided by the patient and the EMS personnel. The history is limited by the condition of the patient.  Level V caveat- condition of patient, bipap, respiratory distress   Past Medical History:  Diagnosis Date  . Anxiety   . Arthritis    "hands" (12/29/2017)  . Basal cell carcinoma of nose    removed  . Bursitis of left shoulder   . Cat scratch fever    Late 90s  . Depression   . Diabetes mellitus, type II, insulin dependent ([place])   . GERD (gastroesophageal reflux disease)   . H. pylori infection 2008 and 1998   treated  . Hyperlipidemia   . Hypertension   . Hypothyroidism   . Multinodular goiter   . Rotator cuff tear, left recurrent   . Urge urinary incontinence   . UTI (lower urinary tract infection) 05/2016    Patient Active Problem List   Diagnosis Date Noted  . Acute exacerbation of congestive heart failure ([place]) 04/25/2018  . Post PTCA 12/29/2017  . Shortness of breath 12/27/2017  . Septic joint of left knee joint ([place]) 11/02/2015  . Abdominal pain     . Vomiting 03/24/2015  . Essential hypertension 03/24/2015  . Thrombocytopenia ([place]) 08/31/2014  . Hypokalemia 08/30/2014  . Intractable nausea and vomiting 08/29/2014  . Protein-calorie malnutrition, severe ([place]) 05/16/2014  . Hyperkalemia 05/15/2014  . Unspecified vitamin D deficiency 05/09/2014  . Nausea alone 05/08/2014  . Osteopenia 04/06/2014  . Neuropathy of leg 04/27/2013  . Weight loss, abnormal 03/16/2013  . Left rotator cuff tear 12/29/2012  . Baker's cyst 04/09/2012  . Diabetic neuropathy ([place]) 09/16/2011  . UNSPECIFIED VENOUS INSUFFICIENCY 09/20/2010  . LACTOSE INTOLERANCE 01/23/2009  . Depression 02/04/2007  . Hypothyroidism 01/14/2007  . DM (diabetes mellitus), type 2, uncontrolled ([place]) 01/14/2007  . HYPERCHOLESTEROLEMIA 01/14/2007  . HYPERTENSION, BENIGN SYSTEMIC 01/14/2007  . RHINITIS, ALLERGIC 01/14/2007  . ARTHRITIS 01/14/2007  . SCIATICA 01/14/2007    Past Surgical History:  Procedure Laterality Date  . APPENDECTOMY  AGE 62  . BACK SURGERY    . BASAL CELL CARCINOMA EXCISION     "nose"  . BLADDER NECK SUSPENSION  1970's  . BUNIONECTOMY WITH HAMMERTOE RECONSTRUCTION Bilateral   . CARPAL TUNNEL RELEASE Right 05/2017  . COLONOSCOPY W/ POLYPECTOMY    . CORONARY ANGIOPLASTY WITH STENT PLACEMENT  12/29/2017  . CORONARY STENT INTERVENTION N/A 12/29/2017   Procedure: CORONARY STENT INTERVENTION;  Surgeon: Nigel Mormon,  MD;  Location: [facility];  Service: Cardiovascular;  Laterality: N/A;  . ESOPHAGOGASTRODUODENOSCOPY ENDOSCOPY    . FORAMINAL DECOMPRESSION AT L2 TO THE SACRUM  01-05-2008   L2  -  S1  . GANGLION CYST EXCISION Left 01/17/2009   ring finger  . IMPLANTATION PERMANENT SPINAL CORD STIMULATOR  06-15-2008   JUNE 2013--  BATTERY CHANGE  . JOINT REPLACEMENT    . LIPOMA EXCISION Right    RIGHT ELBOW  . RIGHT/LEFT HEART CATH AND CORONARY ANGIOGRAPHY N/A 12/29/2017   Procedure: RIGHT/LEFT HEART CATH AND CORONARY ANGIOGRAPHY;  Surgeon:  Nigel Mormon, MD;  Location: [facility];  Service: Cardiovascular;  Laterality: N/A;  . SHOULDER OPEN ROTATOR CUFF REPAIR  09/07/2012   Procedure: ROTATOR CUFF REPAIR SHOULDER OPEN;  Surgeon: Magnus Sinning, MD;  Location: [facility];  Service: Orthopedics;  Laterality: Left;  OPEN ANTERIOR ACROMIONECTOMY AND ROTATOR CUFF REPAIR ON LEFT   . SHOULDER OPEN ROTATOR CUFF REPAIR Left 12/28/2012   Procedure: ROTATOR CUFF REPAIR SHOULDER OPEN;  Surgeon: Magnus Sinning, MD;  Location: [facility];  Service: Orthopedics;  Laterality: Left;  OPEN SHOULDER ROTATOR CUFF REPAIR ON LEFT  WITH ANTERIOR ACROMINECTOMY   . SHOULDER OPEN ROTATOR CUFF REPAIR Right 03/28/2003   RIGHT SHOULDER  DEGENERATIVE AC JOINT AND RC TEAR  . SPINAL CORD STIMULATOR BATTERY EXCHANGE N/A 07/03/2016   Procedure: SPINAL CORD STIMULATOR BATTERY PLACMENT;  Surgeon: Melina Schools, MD;  Location: [facility];  Service: Orthopedics;  Laterality: N/A;  . TONSILLECTOMY  AGE 61  . 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 VAGINAL HYSTERECTOMY  1970's     OB History   None      Home Medications    Prior to Admission medications   Medication Sig Start Date End Date Taking? Authorizing Provider  Dapagliflozin-metFORMIN HCl ER 08-999 MG TB24 Take 1 tablet by mouth daily.   Yes [provider]  amLODipine (NORVASC) 5 MG tablet Take 1 tablet (5 mg total) by mouth daily. 12/31/17   Patwardhan, Reynold Bowen, MD  aspirin ([employer] ASPIRIN) 81 MG chewable tablet Chew 81 mg by mouth daily.     [provider]  busPIRone (BUSPAR) 7.5 MG tablet Take 7.5 mg by mouth 2 (two) times daily.    [provider]  Calcium Carb-Cholecalciferol (CALCIUM 600 + D PO) Take 1 tablet by mouth daily.    [provider]  cefpodoxime (VANTIN) 200 MG tablet TAKE ONE TABLET (200 MG DOSE) BY MOUTH 2 (TWO) TIMES DAILY. 01/23/18   [provider]  cephALEXin  (KEFLEX) 500 MG capsule Take 1 capsule (500 mg total) by mouth 3 (three) times daily. 04/09/18   Wallene Huh, DPM  clopidogrel (PLAVIX) 75 MG tablet Take 1 tablet (75 mg total) by mouth daily with breakfast. 12/31/17   Patwardhan, Reynold Bowen, MD  ezetimibe (ZETIA) 10 MG tablet Take 10 mg by mouth daily.    [provider]  furosemide (LASIX) 20 MG tablet 2 tabs po qam and 1 tab po qlunch. F/u with PCP for any further refills. Patient taking differently: Take 20 mg by mouth daily as needed for edema.  12/04/16   Shawnee Knapp, MD  HUMALOG 100 UNIT/ML injection  04/06/18   [provider]  insulin aspart (NOVOLOG) 100 UNIT/ML injection Inject into the skin See admin instructions. Pt has [place]    [provider]  isosorbide mononitrate (  IMDUR) 60 MG 24 hr tablet Take 60 mg by mouth daily.    [provider]  levothyroxine (SYNTHROID, LEVOTHROID) 50 MCG tablet Take 50 mcg by mouth every morning.  08/24/12   Clovis Cao, MD  olmesartan-hydrochlorothiazide (BENICAR HCT) 40-25 MG tablet Take 1 tablet by mouth daily.    [provider]  Omega-3 Fatty Acids (FISH OIL) 1000 MG CAPS Take 1,000 mg by mouth 2 (two) times daily. OTC fish oil    [provider]  ondansetron (ZOFRAN) 4 MG tablet TAKE ONE TABLET (4 MG DOSE) BY MOUTH EVERY 8 (EIGHT) HOURS AS NEEDED FOR NAUSEA. 03/12/18   [provider]  pantoprazole (PROTONIX) 40 MG tablet Take 1 tablet (40 mg total) by mouth 2 (two) times daily. 12/31/17   Patwardhan, Reynold Bowen, MD  potassium chloride SA (KLOR-CON M20) 20 MEQ tablet Take 1 tablet (20 mEq total) by mouth 2 (two) times daily. With lasix. F/u with PCP for any additional refills. Patient taking differently: Take 20 mEq by mouth daily as needed. With lasix. F/u with PCP for any additional refills. 12/04/16   Shawnee Knapp, MD  rosuvastatin (CRESTOR) 40 MG tablet Take 1 tablet (40 mg total) by mouth at bedtime. 12/31/17   Patwardhan, Reynold Bowen, MD   torsemide (DEMADEX) 20 MG tablet TAKE 1 TABLET DAILY AS NEEDED FOR LEG EDEMA 03/16/18   [provider]  valsartan-hydrochlorothiazide (DIOVAN-HCT) 320-12.5 MG tablet Take 1 tablet by mouth daily.    [provider]  Vitamin D, Ergocalciferol, (DRISDOL) 50000 units CAPS capsule Take 50,000 Units by mouth every 7 (seven) days.    [provider]    Family History Family History  Problem Relation Age of Onset  . Cancer Mother        breast  . Heart Problems Mother   . Prostate cancer Father   . Muscular dystrophy Son   . Cancer Maternal Grandmother        breast  . Heart disease Maternal Grandfather   . Breast cancer Sister     Social History Social History   Tobacco Use  . Smoking status: Never Smoker  . Smokeless tobacco: Never Used  Substance Use Topics  . Alcohol use: No  . Drug use: No     Allergies   Rosiglitazone maleate; Morphine; Sulfa antibiotics; Elavil [amitriptyline]; and Tramadol-acetaminophen   Review of Systems Review of Systems  Respiratory: Positive for shortness of breath.   Cardiovascular: Positive for chest pain and leg swelling.  All other systems reviewed and are negative.    Physical Exam Updated Vital Signs BP (!) 147/80   Pulse 96   Temp 98 F (36.7 C) (Axillary)   Resp (!) 24   Ht 5\' 4"  (1.626 m)   Wt 86.2 kg (190 lb)   SpO2 90%   BMI 32.61 kg/m   Physical Exam  Constitutional:  Uncomfortable, tachypneic   HENT:  Head: Normocephalic.  Eyes: Pupils are equal, round, and reactive to light.  Neck: Normal range of motion.  Cardiovascular: Regular rhythm and normal pulses.  Pulmonary/Chest:  Tachypneic, rales to mid lung fields, no obvious wheezing. + moderate distress   Musculoskeletal:  1+ edema bilaterally   Neurological: She is alert.  Skin: Skin is warm.  Psychiatric: She has a normal mood and affect.  Nursing note and vitals reviewed.    ED Treatments / Results  Labs (all labs ordered  are listed, but only abnormal results are displayed) Labs Reviewed  COMPREHENSIVE  METABOLIC PANEL - Abnormal; Notable for the following components:      Result Value   Glucose, Bld 364 (*)    Creatinine, Ser 1.27 (*)    GFR calc non Af Amer 40 (*)    GFR calc Af Amer 46 (*)    All other components within normal limits  BRAIN NATRIURETIC PEPTIDE - Abnormal; Notable for the following components:   B Natriuretic Peptide 549.5 (*)    All other components within normal limits  URINALYSIS, ROUTINE W REFLEX MICROSCOPIC - Abnormal; Notable for the following components:   Color, Urine STRAW (*)    Glucose, UA >=500 (*)    Hgb urine dipstick SMALL (*)    Protein, ur 100 (*)    All other components within normal limits  I-STAT CHEM 8, ED - Abnormal; Notable for the following components:   Creatinine, Ser 1.20 (*)    Glucose, Bld 365 (*)    Calcium, Ion 1.14 (*)    All other components within normal limits  I-STAT ARTERIAL BLOOD GAS, ED - Abnormal; Notable for the following components:   pO2, Arterial 218.0 (*)    All other components within normal limits  I-STAT TROPONIN, ED - Abnormal; Notable for the following components:   Troponin i, poc 0.41 (*)    All other components within normal limits  CBC WITH DIFFERENTIAL/PLATELET  BLOOD GAS, ARTERIAL  HEPARIN LEVEL (UNFRACTIONATED)  I-STAT TROPONIN, ED    EKG EKG Interpretation  Date/Time:  Sunday April 25 2018 14:56:29 EDT Ventricular Rate:  86 PR Interval:    QRS Duration: 97 QT Interval:  415 QTC Calculation: 497 R Axis:   32 Text Interpretation:  Sinus rhythm Sinus pause LVH with secondary repolarization abnormality Borderline prolonged QT interval LBBB improved  Confirmed by Wandra Arthurs [phone]) on 04/25/2018 3:00:02 PM   Radiology Dg Chest Port 1 View  Result Date: 04/25/2018 CLINICAL DATA:  Shortness of breath EXAM: PORTABLE CHEST 1 VIEW COMPARISON:  Mar 24, 2015 FINDINGS: There is generalized interstitial edema. There are small  pleural effusions bilaterally. There is patchy consolidation in the medial left base. Heart is mildly enlarged with pulmonary vascularity within normal limits. No adenopathy. Thoracic stimulator leads are in the midthoracic region. Postoperative change noted in the right shoulder. There is evidence of previous surgery involving the lateral right clavicle. IMPRESSION: Interstitial edema with small pleural effusions. Question a degree of congestive heart failure. Heart mildly enlarged. Mild airspace consolidation medial left base may represent alveolar edema but also could represent a focal area of pneumonia. Electronically Signed   By: Lowella Grip III M.D.   On: 04/25/2018 13:20    Procedures Procedures (including critical care time)  Angiocath insertion Performed by: Wandra Arthurs  Consent: Verbal consent obtained. Risks and benefits: risks, benefits and alternatives were discussed Time out: Immediately prior to procedure a "time out" was called to verify the correct patient, procedure, equipment, support staff and site/side marked as required.  Preparation: Patient was prepped and draped in the usual sterile fashion.  Vein Location: R antecube Ultrasound Guided  Gauge: 20 long  Normal blood return and flush without difficulty Patient tolerance: Patient tolerated the procedure well with no immediate complications.  CRITICAL CARE Performed by: Wandra Arthurs   Total critical care time: 45 minutes  Critical care time was exclusive of separately billable procedures and treating other patients.  Critical care was necessary to treat or prevent imminent or life-threatening deterioration.  Critical care was time spent  personally by me on the following activities: development of treatment plan with patient and/or surrogate as well as nursing, discussions with consultants, evaluation of patient's response to treatment, examination of patient, obtaining history from patient or surrogate,  ordering and performing treatments and interventions, ordering and review of laboratory studies, ordering and review of radiographic studies, pulse oximetry and re-evaluation of patient's condition.    Medications Ordered in ED Medications  heparin bolus via infusion 4,000 Units (has no administration in time range)  heparin ADULT infusion 100 units/mL (25000 units/268mL sodium chloride 0.45%) (has no administration in time range)  aspirin tablet 325 mg (has no administration in time range)  nitroGLYCERIN 50 mg in dextrose 5 % 250 mL (0.2 mg/mL) infusion (7.5 mcg/min Intravenous Rate/Dose Change 04/25/18 1436)  furosemide (LASIX) injection 40 mg (40 mg Intravenous Given 04/25/18 1400)     Initial Impression / Assessment and Plan / ED Course  I have reviewed the triage vital signs and the nursing notes.  Pertinent labs & imaging results that were available during my care of the patient were reviewed by me and considered in my medical decision making (see chart for details).    SYLVANIA MOSS is a 77 y.o. female here with acute onset of shortness of breath. Concerned for acute pulmonary edema. Has previous MI and has cardiac stent on plavix. Will get labs, ABG, CXR. Will start nitro drip, give lasix. Will continue bipap.   3 pm Patient still on nitro drip. Given 40 mg IV lasix and patient able to put out about 500 cc of urine. CXR showed pulmonary edema. BNP 500. ABG reassuring on bipap. Will put on ventimask. I called Dr. Mila Palmer partner. Cardiology to admit for pulmonary edema, new LBBB.   3:30 pm Trop turned positive to 0.41. Can be from demand ischemia vs new onset LBBB. Ordered heparin and ASA for possible NSTEMI.    Final Clinical Impressions(s) / ED Diagnoses   Final diagnoses:  Acute pulmonary edema [facility])    ED Discharge Orders    None       Drenda Freeze, MD 04/25/18 1501    Drenda Freeze, MD 04/25/18 [phone]

## 2018-04-25 NOTE — ED Notes (Signed)
Dr Nadyne Coombes here to see

## 2018-04-26 ENCOUNTER — Inpatient Hospital Stay (HOSPITAL_COMMUNITY)
Admission: EM | Disposition: A | Discharge status: Home / Self Care | Payer: Self-pay | Source: Home / Self Care | Attending: Cardiology

## 2018-04-26 ENCOUNTER — Inpatient Hospital Stay (HOSPITAL_COMMUNITY): Payer: Medicare Other

## 2018-04-26 HISTORY — PX: CORONARY STENT INTERVENTION: CATH118234

## 2018-04-26 HISTORY — PX: LEFT HEART CATH AND CORONARY ANGIOGRAPHY: CATH118249

## 2018-04-26 LAB — BASIC METABOLIC PANEL
Anion gap: 10 (ref 5–15)
BUN: 19 mg/dL (ref 6–20)
CO2: 24 mmol/L (ref 22–32)
CREATININE: 1.14 mg/dL — AB (ref 0.44–1.00)
Calcium: 8.8 mg/dL — ABNORMAL LOW (ref 8.9–10.3)
Chloride: 109 mmol/L (ref 101–111)
GFR calc Af Amer: 53 mL/min — ABNORMAL LOW (ref >60)
GFR, EST NON AFRICAN AMERICAN: 46 mL/min — AB (ref >60)
GLUCOSE: 151 mg/dL — AB (ref 65–99)
POTASSIUM: 3.4 mmol/L — AB (ref 3.5–5.1)
Sodium: 143 mmol/L (ref 135–145)

## 2018-04-26 LAB — CBC
HEMATOCRIT: 37.7 % (ref 36.0–46.0)
Hemoglobin: 12.2 g/dL (ref 12.0–15.0)
MCH: 29.6 pg (ref 26.0–34.0)
MCHC: 32.4 g/dL (ref 30.0–36.0)
MCV: 91.5 fL (ref 78.0–100.0)
PLATELETS: 144 10*3/uL — AB (ref 150–400)
RBC: 4.12 MIL/uL (ref 3.87–5.11)
RDW: 13.6 % (ref 11.5–15.5)
WBC: 6.1 10*3/uL (ref 4.0–10.5)

## 2018-04-26 LAB — POCT ACTIVATED CLOTTING TIME
Activated Clotting Time: 400 seconds
Activated Clotting Time: 373 seconds
Activated Clotting Time: 219 seconds
Activated Clotting Time: 290 seconds

## 2018-04-26 LAB — GLUCOSE, CAPILLARY
Glucose-Capillary: 128 mg/dL — ABNORMAL HIGH (ref 65–99)
Glucose-Capillary: 166 mg/dL — ABNORMAL HIGH (ref 65–99)
Glucose-Capillary: 184 mg/dL — ABNORMAL HIGH (ref 65–99)
Glucose-Capillary: 188 mg/dL — ABNORMAL HIGH (ref 65–99)

## 2018-04-26 LAB — ECHOCARDIOGRAM COMPLETE
HEIGHTINCHES: 65 in
Weight: 2945.6 oz

## 2018-04-26 LAB — HEPARIN LEVEL (UNFRACTIONATED)
Heparin Unfractionated: 0.2 IU/mL — ABNORMAL LOW (ref 0.30–0.70)
Heparin Unfractionated: >2.2 IU/mL — ABNORMAL HIGH (ref 0.30–0.70)

## 2018-04-26 LAB — PROTIME-INR
INR: 1.33
Prothrombin Time: 16.3 seconds — ABNORMAL HIGH (ref 11.4–15.2)

## 2018-04-26 LAB — TROPONIN I
TROPONIN I: 0.53 ng/mL — AB (ref <0.03)
Troponin I: 0.72 ng/mL (ref <0.03)

## 2018-04-26 SURGERY — LEFT HEART CATH AND CORONARY ANGIOGRAPHY
Anesthesia: LOCAL

## 2018-04-26 MED ORDER — MIDAZOLAM HCL 2 MG/2ML IJ SOLN
INTRAMUSCULAR | Status: DC | PRN
  Administered 2018-04-26: 1 mg via INTRAVENOUS

## 2018-04-26 MED ORDER — FENTANYL CITRATE (PF) 100 MCG/2ML IJ SOLN
INTRAMUSCULAR | Status: DC | PRN
  Administered 2018-04-26: 25 ug via INTRAVENOUS

## 2018-04-26 MED ORDER — HEPARIN SODIUM (PORCINE) 1000 UNIT/ML IJ SOLN
INTRAMUSCULAR | Status: DC | PRN
  Administered 2018-04-26: 4000 [IU] via INTRAVENOUS
  Administered 2018-04-26: 5000 [IU] via INTRAVENOUS
  Administered 2018-04-26: 3000 [IU] via INTRAVENOUS

## 2018-04-26 MED ORDER — SODIUM CHLORIDE 0.9 % IV SOLN: 250.0000 mL | INTRAVENOUS | Status: DC | PRN

## 2018-04-26 MED ORDER — LIDOCAINE HCL (PF) 1 % IJ SOLN
INTRAMUSCULAR | Status: AC
  Filled 2018-04-26: qty 30

## 2018-04-26 MED ORDER — VALSARTAN-HYDROCHLOROTHIAZIDE 320-12.5 MG PO TABS: 1.0000 | ORAL_TABLET | Freq: Every day | ORAL | Status: DC

## 2018-04-26 MED ORDER — ONDANSETRON HCL 4 MG/2ML IJ SOLN
4.0000 mg | Freq: Four times a day (QID) | INTRAMUSCULAR | Status: DC | PRN
Start: 2018-04-26 — End: 2018-04-27

## 2018-04-26 MED ORDER — SODIUM CHLORIDE 0.9% FLUSH
3.0000 mL | Freq: Two times a day (BID) | INTRAVENOUS | Status: DC
  Administered 2018-04-26: 21:00:00 3 mL via INTRAVENOUS

## 2018-04-26 MED ORDER — VERAPAMIL HCL 2.5 MG/ML IV SOLN
INTRAVENOUS | Status: AC
  Filled 2018-04-26: qty 2

## 2018-04-26 MED ORDER — IRBESARTAN 300 MG PO TABS
300.0000 mg | ORAL_TABLET | Freq: Every day | ORAL | Status: DC
  Filled 2018-04-26: qty 1

## 2018-04-26 MED ORDER — NITROGLYCERIN 1 MG/10 ML FOR IR/CATH LAB
INTRA_ARTERIAL | Status: AC
  Filled 2018-04-26: qty 10

## 2018-04-26 MED ORDER — MIDAZOLAM HCL 2 MG/2ML IJ SOLN
INTRAMUSCULAR | Status: AC
  Filled 2018-04-26: qty 2

## 2018-04-26 MED ORDER — PROMETHAZINE HCL 25 MG/ML IJ SOLN
6.2500 mg | Freq: Four times a day (QID) | INTRAMUSCULAR | Status: DC | PRN
  Administered 2018-04-26: 22:00:00 6.25 mg via INTRAVENOUS
  Filled 2018-04-26: qty 1

## 2018-04-26 MED ORDER — HYDRALAZINE HCL 20 MG/ML IJ SOLN: 5.0000 mg | INTRAMUSCULAR | Status: AC | PRN

## 2018-04-26 MED ORDER — HYDRALAZINE HCL 20 MG/ML IJ SOLN: 10.0000 mg | Freq: Four times a day (QID) | INTRAMUSCULAR | Status: DC | PRN

## 2018-04-26 MED ORDER — FAMOTIDINE IN NACL 20-0.9 MG/50ML-% IV SOLN
INTRAVENOUS | Status: AC | PRN
  Administered 2018-04-26: 20 mg via INTRAVENOUS

## 2018-04-26 MED ORDER — FENTANYL CITRATE (PF) 100 MCG/2ML IJ SOLN
INTRAMUSCULAR | Status: AC
  Filled 2018-04-26: qty 2

## 2018-04-26 MED ORDER — ACETAMINOPHEN 325 MG PO TABS: 650.0000 mg | ORAL_TABLET | ORAL | Status: DC | PRN

## 2018-04-26 MED ORDER — HEPARIN SODIUM (PORCINE) 1000 UNIT/ML IJ SOLN
INTRAMUSCULAR | Status: AC
  Filled 2018-04-26: qty 1

## 2018-04-26 MED ORDER — HYDROCHLOROTHIAZIDE 25 MG PO TABS
25.0000 mg | ORAL_TABLET | Freq: Every day | ORAL | Status: DC
  Filled 2018-04-26: qty 1

## 2018-04-26 MED ORDER — IOHEXOL 350 MG/ML SOLN
INTRAVENOUS | Status: DC | PRN
  Administered 2018-04-26: 145 mL

## 2018-04-26 MED ORDER — NITROGLYCERIN 1 MG/10 ML FOR IR/CATH LAB
INTRA_ARTERIAL | Status: DC | PRN
  Administered 2018-04-26: 150 ug via INTRACORONARY
  Administered 2018-04-26 (×4): 200 ug via INTRACORONARY

## 2018-04-26 MED ORDER — VERAPAMIL HCL 2.5 MG/ML IV SOLN
INTRAVENOUS | Status: DC | PRN
  Administered 2018-04-26: 4 mL via INTRA_ARTERIAL

## 2018-04-26 MED ORDER — SODIUM CHLORIDE 0.9 % IV SOLN: INTRAVENOUS | Status: AC

## 2018-04-26 MED ORDER — HEPARIN (PORCINE) IN NACL 1000-0.9 UT/500ML-% IV SOLN
INTRAVENOUS | Status: AC
  Filled 2018-04-26: qty 1000

## 2018-04-26 MED ORDER — ONDANSETRON HCL 4 MG/2ML IJ SOLN
INTRAMUSCULAR | Status: DC | PRN
  Administered 2018-04-26: 4 mg via INTRAVENOUS

## 2018-04-26 MED ORDER — ONDANSETRON HCL 4 MG/2ML IJ SOLN
INTRAMUSCULAR | Status: AC
  Filled 2018-04-26: qty 2

## 2018-04-26 MED ORDER — FUROSEMIDE 10 MG/ML IJ SOLN
20.0000 mg | Freq: Every day | INTRAMUSCULAR | Status: DC
  Administered 2018-04-27: 20 mg via INTRAVENOUS
  Filled 2018-04-26 (×2): qty 2

## 2018-04-26 MED ORDER — LIDOCAINE HCL (PF) 1 % IJ SOLN
INTRAMUSCULAR | Status: DC | PRN
  Administered 2018-04-26: 2 mL

## 2018-04-26 MED ORDER — METOPROLOL TARTRATE 25 MG PO TABS: 25.0000 mg | ORAL_TABLET | Freq: Two times a day (BID) | ORAL | Status: DC

## 2018-04-26 MED ORDER — SODIUM CHLORIDE 0.9% FLUSH: 3.0000 mL | INTRAVENOUS | Status: DC | PRN

## 2018-04-26 MED ORDER — METOCLOPRAMIDE HCL 5 MG/ML IJ SOLN
5.0000 mg | Freq: Three times a day (TID) | INTRAMUSCULAR | Status: DC | PRN
  Filled 2018-04-26: qty 2

## 2018-04-26 MED ORDER — HEPARIN BOLUS VIA INFUSION
2000.0000 [IU] | Freq: Once | INTRAVENOUS | Status: AC
  Administered 2018-04-26: 2000 [IU] via INTRAVENOUS
  Filled 2018-04-26: qty 2000

## 2018-04-26 MED ORDER — HEPARIN (PORCINE) IN NACL 2-0.9 UNITS/ML
INTRAMUSCULAR | Status: AC | PRN
  Administered 2018-04-26 (×2): 500 mL

## 2018-04-26 MED ORDER — FAMOTIDINE IN NACL 20-0.9 MG/50ML-% IV SOLN
INTRAVENOUS | Status: AC
  Filled 2018-04-26: qty 50

## 2018-04-26 MED ORDER — LABETALOL HCL 5 MG/ML IV SOLN
10.0000 mg | INTRAVENOUS | Status: AC | PRN
  Administered 2018-04-26 (×2): 10 mg via INTRAVENOUS
  Filled 2018-04-26 (×2): qty 4

## 2018-04-26 MED ORDER — LABETALOL HCL 300 MG PO TABS
300.0000 mg | ORAL_TABLET | Freq: Two times a day (BID) | ORAL | Status: DC
  Administered 2018-04-27: 300 mg via ORAL
  Filled 2018-04-26 (×2): qty 1

## 2018-04-26 MED ORDER — HEPARIN (PORCINE) IN NACL 2-0.9 UNITS/ML
INTRAMUSCULAR | Status: AC | PRN
  Administered 2018-04-26: 500 mL via INTRA_ARTERIAL

## 2018-04-26 SURGICAL SUPPLY — 24 items
BALLN EMERGE MR 2.0X8 (BALLOONS) ×2
BALLN MINITREK OTW 1.20X6 (BALLOONS) ×2
BALLOON EMERGE MR 2.0X8 (BALLOONS) ×1 IMPLANT
BALLOON MINITREK OTW 1.20X6 (BALLOONS) ×1 IMPLANT
CATH 5FR JL3.5 JR4 ANG PIG MP (CATHETERS) ×2 IMPLANT
CATH LAUNCHER 6FR EBU3.5 (CATHETERS) ×2 IMPLANT
CATH SUPERCROSS ANGLED 90 DEG (MICROCATHETER) ×2 IMPLANT
COVER PRB 48X5XTLSCP FOLD TPE (BAG) ×1 IMPLANT
COVER PROBE 5X48 (BAG) ×2
DEVICE RAD COMP TR BAND LRG (VASCULAR PRODUCTS) ×2 IMPLANT
ELECT DEFIB PAD ADLT CADENCE (PAD) ×2 IMPLANT
GLIDESHEATH SLEND A-KIT 6F 22G (SHEATH) ×2 IMPLANT
GUIDEWIRE INQWIRE 1.5J.035X260 (WIRE) ×1 IMPLANT
INQWIRE 1.5J .035X260CM (WIRE) ×2
KIT ENCORE 26 ADVANTAGE (KITS) ×2 IMPLANT
KIT HEART LEFT (KITS) ×2 IMPLANT
KIT HEMO VALVE WATCHDOG (MISCELLANEOUS) ×2 IMPLANT
PACK CARDIAC CATHETERIZATION (CUSTOM PROCEDURE TRAY) ×2 IMPLANT
STENT RESOLUTE ONYX 2.0X12 (Permanent Stent) ×2 IMPLANT
TRANSDUCER W/STOPCOCK (MISCELLANEOUS) ×2 IMPLANT
TUBING CIL FLEX 10 FLL-RA (TUBING) ×2 IMPLANT
WIRE ASAHI PROWATER 180CM (WIRE) ×4 IMPLANT
WIRE ASAHI PROWATER 300CM (WIRE) ×2 IMPLANT
WIRE HI TORQ BMW 190CM (WIRE) ×4 IMPLANT

## 2018-04-26 NOTE — Plan of Care (Signed)
  Problem: Clinical Measurements: Goal: Will remain free from infection Outcome: Progressing Note:  No s/s of infection noted. Goal: Respiratory complications will improve Outcome: Progressing Note:  Improving with IV lasix.   Problem: Pain Managment: Goal: General experience of comfort will improve Outcome: Progressing Note:  Denies c/o pain or discomfort.

## 2018-04-26 NOTE — Progress Notes (Signed)
  Echocardiogram 2D Echocardiogram has been performed.  Darlina Sicilian M 04/26/2018, 11:40 AM

## 2018-04-26 NOTE — H&P (View-Only) (Signed)
Subjective:  No chest pain Breathing much improved  Objective:  Vital Signs in the last 24 hours: Temp:  [98 F (36.7 C)-98.9 F (37.2 C)] 98.1 F (36.7 C) (06/10 0532) Pulse Rate:  [59-96] 65 (06/10 0532) Resp:  [12-30] 20 (06/10 0532) BP: (125-175)/(51-117) 152/51 (06/10 0532) SpO2:  [90 %-100 %] 97 % (06/10 0532) FiO2 (%):  [5 %-100 %] 5 % (06/09 1901) Weight:  [83.5 kg (184 lb 1.6 oz)-86.2 kg (190 lb)] 83.5 kg (184 lb 1.6 oz) (06/10 0532)  Intake/Output from previous day: 06/09 0701 - 06/10 0700 In: 798.7 [P.O.:600; I.V.:198.7] Out: 1850 [Urine:1850] Intake/Output from this shift: No intake/output data recorded.  Physical Exam: Physical Exam  Constitutional: She is oriented to person, place, and time. She appears well-developed. No distress.  HENT:  Head: Normocephalic and atraumatic.  Eyes: Pupils are equal, round, and reactive to light. Conjunctivae are normal.  Neck: Normal range of motion. Neck supple. No JVD present.  Cardiovascular: Normal rate and regular rhythm.  No murmur heard. Pulmonary/Chest: Effort normal. She has rales (Left basal rales).  Abdominal: Soft. Bowel sounds are normal. There is no tenderness. There is no rebound.  Musculoskeletal: She exhibits edema (1+ b/l).  Neurological: She is alert and oriented to person, place, and time. No cranial nerve deficit.  Skin: Skin is warm and dry.  Psychiatric: She has a normal mood and affect.  Nursing note reviewed.    Lab Results: Recent Labs    04/25/18 1256 04/25/18 1306 04/26/18 0633  WBC 6.3  --  6.1  HGB 12.8 13.3 12.2  PLT 156  --  144*   Recent Labs    04/25/18 1256 04/25/18 1306 04/26/18 0633  NA 141 141 143  K 4.8 4.7 3.4*  CL 108 107 109  CO2 23  --  24  GLUCOSE 364* 365* 151*  BUN 17 19 19   CREATININE 1.27* 1.20* 1.14*   Recent Labs    04/25/18 2328 04/26/18 0633  TROPONINI 0.72* 0.53*   Hepatic Function Panel Recent Labs    04/25/18 1256  PROT 7.3  ALBUMIN 3.6   AST 16  ALT 14  ALKPHOS 97  BILITOT 0.8   ICARDIAC STUDIES:  EKG initial EKG at presentation on 04/26/2018 reveals normal sinus rhythm with left bundle branch block.  Repeat EKG reveals normal sinus rhythm with LVH with repolarization abnormality unchanged from prior EKG.  Echocardiogram 12/23/2016: Left ventricle cavity is mildly dilated. Mild concentric hypertrophy of the left ventricle. Diffuse hypokinesis. Mild to moderate decrease in LV systolic function. Cannot exclude inferolateral hypokinesis. Poor apical views. Estimated LVEF 40-45% Doppler evidence of grade I (impaired) diastolic dysfunction, elevated LAP. Left atrial cavity is mildly dilated at 4 cm, but moderate to severely dilated by volume. Trace aortic regurgitation. Mild (Grade I) mitral regurgitation. Mild to moderate tricuspid regurgitation. Borderline mild pulmonary hypertension. Pulmonary artery systolic pressure is estimated at 29 mm Hg.  Coronary angiogram 12/29/2017: Severe two vessel vessel coronary artery disease LM: Normal Ostial LAD: 80% Ramus bifurcation 75% LCx: Mild disease RCA: Mild disease  Normal filling pressures No pulmonary hypertension  Successful percutaneous coronary intervention Ostial LAD PTCA and stent placement Resolute Onyx 3.0 X 15 mm drug-eluting stent Unsuccessful PTCA Ramus intermedius  Assessment: 77 year old Caucasian female  Shortness of breath: Likely due to combination of non-STEMI and HFpEF exacerbation. While BNP elevation points towards the latter, she did have transient LBBB on presentation and troponin elevation. She has ostial ALD stent from 12/2017 and an unrevascularized  medium sized ramus. Thus warrants a coronary aniogram Non-STEMI Hypertension HFpEF Type 2 DM  Recommendations: Gentle diuresis.  Echocardiogram. Plan on performing cath this afternoon. NPO after breakfast Continue ASA/plavix for now. Will switch to Brilinta after cath Continue  NTG/heparin gtt Continue baseline amodipine 5 mg, labetalol 300 mg bid. Hold valsartan-HCTZ today to avoid CIN Insulin   LOS: 1 day    Gearldean Lomanto J Jung Yurchak 04/26/2018, 9:21 AM  [place]etta Knee Esther Hardy, MD [facility] [place] Pager: [phone] Office: [phone] If no answer Cell [phone]

## 2018-04-26 NOTE — Progress Notes (Signed)
[place] for Heparin Indication: chest pain/ACS  Allergies  Allergen Reactions  . Rosiglitazone Maleate Anaphylaxis and Swelling      (ANTIDIABETIC MED.) All over body   . Morphine Other (See Comments)    SEVERE HYPOTENSION   . Sulfa Antibiotics     Diarrhea, nausea and vomiting.  [name] [Amitriptyline] Nausea Only  . Tramadol-Acetaminophen Rash    Patient Measurements: Height: 5\' 5"  (165.1 cm) Weight: 186 lb 8.2 oz (84.6 kg) IBW/kg (Calculated) : 57 Heparin Dosing Weight: 76 kg   Vital Signs: Temp: 98.6 F (37 C) (06/09 2017) Temp Source: Oral (06/09 2017) BP: 175/55 (06/09 2017) Pulse Rate: 69 (06/09 2017)  Labs: Recent Labs    04/25/18 1256 04/25/18 1306 04/25/18 1659 04/25/18 2328  HGB 12.8 13.3  --   --   HCT 40.8 39.0  --   --   PLT 156  --   --   --   HEPARINUNFRC  --   --   --  0.20*  CREATININE 1.27* 1.20*  --   --   TROPONINI  --   --  0.59*  --     Estimated Creatinine Clearance: 42.8 mL/min (A) (by C-G formula based on SCr of 1.2 mg/dL (H)).   Assessment: 77 y.o. female with NSTEMI for heparin.   Goal of Therapy:  Heparin level 0.3-0.7 units/ml Monitor platelets by anticoagulation protocol: Yes   Plan:  Heparin 2000 units IV bolus, then increase heparin 1200 units/hr Follow-up am labs.   Phillis Knack, PharmD, BCPS

## 2018-04-26 NOTE — Interval H&P Note (Signed)
History and Physical Interval Note:  04/26/2018 2:39 PM  Sara Graves  has presented today for surgery, with the diagnosis of unstable angina  The various methods of treatment have been discussed with the patient and family. After consideration of risks, benefits and other options for treatment, the patient has consented to  Procedure(s): LEFT HEART CATH AND CORONARY ANGIOGRAPHY (N/A) as a surgical intervention .  The patient's history has been reviewed, patient examined, no change in status, stable for surgery.  I have reviewed the patient's chart and labs.  Questions were answered to the patient's satisfaction.    2016 Appropriate Use Criteria for Coronary Revascularization in Patients With Acute Coronary Syndrome NSTEMI/UA High Risk (TIMI Score 5-7)  NSTEMI/Unstable angina, stabilized patient at high risk Link Here: sistemancia.com Indication:  Revascularization by PCI or CABG of 1 or more arteries in a patient with NSTEMI or unstable angina with Stabilization after presentation High risk for clinical events  A (7) Indication: 16; Score 7    [place]

## 2018-04-26 NOTE — Progress Notes (Signed)
ANTICOAGULATION CONSULT NOTE - Follow Up Consult  Pharmacy Consult for Heparin Indication: chest pain/ACS  Allergies  Allergen Reactions  . Rosiglitazone Maleate Anaphylaxis and Swelling      (ANTIDIABETIC MED.) All over body   . Morphine Other (See Comments)    SEVERE HYPOTENSION   . Sulfa Antibiotics     Diarrhea, nausea and vomiting.  [name] [Amitriptyline] Nausea Only  . Tramadol-Acetaminophen Rash    Patient Measurements: Height: 5\' 5"  (165.1 cm) Weight: 184 lb 1.6 oz (83.5 kg) IBW/kg (Calculated) : 57 Heparin Dosing Weight: 76 kg  Vital Signs: Temp: 98.1 F (36.7 C) (06/10 0532) Temp Source: Oral (06/10 0532) BP: 152/51 (06/10 0532) Pulse Rate: 65 (06/10 0532)  Labs: Recent Labs    04/25/18 1256 04/25/18 1306 04/25/18 1659 04/25/18 2328 04/26/18 0633 04/26/18 0637  HGB 12.8 13.3  --   --  12.2  --   HCT 40.8 39.0  --   --  37.7  --   PLT 156  --   --   --  144*  --   HEPARINUNFRC  --   --   --  0.20*  --  >2.20*  CREATININE 1.27* 1.20*  --   --  1.14*  --   TROPONINI  --   --  0.59* 0.72* 0.53*  --     Estimated Creatinine Clearance: 44.8 mL/min (A) (by C-G formula based on SCr of 1.14 mg/dL (H)).  Assessment:   77 yr old female continues on IV heparin for NSTEMI.  For PCI later today.    Heparin level this morning is supratherapeutic (>2.20) on 1200 units/hr.  Last heparin level was subtherapeutic (0.20) on 1000 units/hr.  Sample drawn from opposite arm from IV site.   RN reports Heparin off for ~45 minutes at some point last night, but unsure of timing.  Possibly prior to previous heparin level?  Goal of Therapy:  Heparin level 0.3-0.7 units/ml Monitor platelets by anticoagulation protocol: Yes   Plan:   Decrease heparin drip to 1000 units/hr.  Drip to be held on call to cath lab.  Follow up post-cath.  Arty Baumgartner, [place] Pager: [phone] or phone: [phone] 04/26/2018,10:46 AM

## 2018-04-26 NOTE — Progress Notes (Signed)
Subjective:  No chest pain Breathing much improved  Objective:  Vital Signs in the last 24 hours: Temp:  [98 F (36.7 C)-98.9 F (37.2 C)] 98.1 F (36.7 C) (06/10 0532) Pulse Rate:  [59-96] 65 (06/10 0532) Resp:  [12-30] 20 (06/10 0532) BP: (125-175)/(51-117) 152/51 (06/10 0532) SpO2:  [90 %-100 %] 97 % (06/10 0532) FiO2 (%):  [5 %-100 %] 5 % (06/09 1901) Weight:  [83.5 kg (184 lb 1.6 oz)-86.2 kg (190 lb)] 83.5 kg (184 lb 1.6 oz) (06/10 0532)  Intake/Output from previous day: 06/09 0701 - 06/10 0700 In: 798.7 [P.O.:600; I.V.:198.7] Out: 1850 [Urine:1850] Intake/Output from this shift: No intake/output data recorded.  Physical Exam: Physical Exam  Constitutional: She is oriented to person, place, and time. She appears well-developed. No distress.  HENT:  Head: Normocephalic and atraumatic.  Eyes: Pupils are equal, round, and reactive to light. Conjunctivae are normal.  Neck: Normal range of motion. Neck supple. No JVD present.  Cardiovascular: Normal rate and regular rhythm.  No murmur heard. Pulmonary/Chest: Effort normal. She has rales (Left basal rales).  Abdominal: Soft. Bowel sounds are normal. There is no tenderness. There is no rebound.  Musculoskeletal: She exhibits edema (1+ b/l).  Neurological: She is alert and oriented to person, place, and time. No cranial nerve deficit.  Skin: Skin is warm and dry.  Psychiatric: She has a normal mood and affect.  Nursing note reviewed.    Lab Results: Recent Labs    04/25/18 1256 04/25/18 1306 04/26/18 0633  WBC 6.3  --  6.1  HGB 12.8 13.3 12.2  PLT 156  --  144*   Recent Labs    04/25/18 1256 04/25/18 1306 04/26/18 0633  NA 141 141 143  K 4.8 4.7 3.4*  CL 108 107 109  CO2 23  --  24  GLUCOSE 364* 365* 151*  BUN 17 19 19   CREATININE 1.27* 1.20* 1.14*   Recent Labs    04/25/18 2328 04/26/18 0633  TROPONINI 0.72* 0.53*   Hepatic Function Panel Recent Labs    04/25/18 1256  PROT 7.3  ALBUMIN 3.6   AST 16  ALT 14  ALKPHOS 97  BILITOT 0.8   ICARDIAC STUDIES:  EKG initial EKG at presentation on 04/26/2018 reveals normal sinus rhythm with left bundle branch block.  Repeat EKG reveals normal sinus rhythm with LVH with repolarization abnormality unchanged from prior EKG.  Echocardiogram 12/23/2016: Left ventricle cavity is mildly dilated. Mild concentric hypertrophy of the left ventricle. Diffuse hypokinesis. Mild to moderate decrease in LV systolic function. Cannot exclude inferolateral hypokinesis. Poor apical views. Estimated LVEF 40-45% Doppler evidence of grade I (impaired) diastolic dysfunction, elevated LAP. Left atrial cavity is mildly dilated at 4 cm, but moderate to severely dilated by volume. Trace aortic regurgitation. Mild (Grade I) mitral regurgitation. Mild to moderate tricuspid regurgitation. Borderline mild pulmonary hypertension. Pulmonary artery systolic pressure is estimated at 29 mm Hg.  Coronary angiogram 12/29/2017: Severe two vessel vessel coronary artery disease LM: Normal Ostial LAD: 80% Ramus bifurcation 75% LCx: Mild disease RCA: Mild disease  Normal filling pressures No pulmonary hypertension  Successful percutaneous coronary intervention Ostial LAD PTCA and stent placement Resolute Onyx 3.0 X 15 mm drug-eluting stent Unsuccessful PTCA Ramus intermedius  Assessment: 77 year old Caucasian female  Shortness of breath: Likely due to combination of non-STEMI and HFpEF exacerbation. While BNP elevation points towards the latter, she did have transient LBBB on presentation and troponin elevation. She has ostial ALD stent from 12/2017 and an unrevascularized  medium sized ramus. Thus warrants a coronary aniogram Non-STEMI Hypertension HFpEF Type 2 DM  Recommendations: Gentle diuresis.  Echocardiogram. Plan on performing cath this afternoon. NPO after breakfast Continue ASA/plavix for now. Will switch to Brilinta after cath Continue  NTG/heparin gtt Continue baseline amodipine 5 mg, labetalol 300 mg bid. Hold valsartan-HCTZ today to avoid CIN Insulin   LOS: 1 day    Kashmere Daywalt J Baleigh Rennaker 04/26/2018, 9:21 AM  Marquee Fuchs Esther Hardy, MD [facility] Pager: [phone] Office: [phone] If no answer Cell [phone]

## 2018-04-27 ENCOUNTER — Encounter (HOSPITAL_COMMUNITY): Payer: Self-pay | Admitting: Cardiology

## 2018-04-27 ENCOUNTER — Other Ambulatory Visit: Payer: Self-pay

## 2018-04-27 LAB — BASIC METABOLIC PANEL
Anion gap: 16 — ABNORMAL HIGH (ref 5–15)
BUN: 25 mg/dL — ABNORMAL HIGH (ref 6–20)
CHLORIDE: 105 mmol/L (ref 101–111)
CO2: 21 mmol/L — ABNORMAL LOW (ref 22–32)
Calcium: 8.8 mg/dL — ABNORMAL LOW (ref 8.9–10.3)
Creatinine, Ser: 1.35 mg/dL — ABNORMAL HIGH (ref 0.44–1.00)
GFR calc non Af Amer: 37 mL/min — ABNORMAL LOW (ref >60)
GFR, EST AFRICAN AMERICAN: 43 mL/min — AB (ref >60)
Glucose, Bld: 236 mg/dL — ABNORMAL HIGH (ref 65–99)
Potassium: 4.2 mmol/L (ref 3.5–5.1)
SODIUM: 142 mmol/L (ref 135–145)

## 2018-04-27 LAB — GLUCOSE, CAPILLARY: Glucose-Capillary: 207 mg/dL — ABNORMAL HIGH (ref 65–99)

## 2018-04-27 MED ORDER — STUDY - INVESTIGATIONAL MEDICATION: 1.0000 | Freq: Every day | 99 refills | Status: DC

## 2018-04-27 MED ORDER — POTASSIUM CHLORIDE CRYS ER 20 MEQ PO TBCR: 20.0000 meq | EXTENDED_RELEASE_TABLET | Freq: Two times a day (BID) | ORAL | 0 refills | Status: DC

## 2018-04-27 MED ORDER — PRASUGREL HCL 10 MG PO TABS: 10.0000 mg | ORAL_TABLET | Freq: Every day | ORAL | Status: DC

## 2018-04-27 MED ORDER — PRASUGREL HCL 10 MG PO TABS
60.0000 mg | ORAL_TABLET | Freq: Once | ORAL | Status: AC
  Administered 2018-04-27: 08:00:00 60 mg via ORAL
  Filled 2018-04-27: qty 6

## 2018-04-27 MED ORDER — PRASUGREL HCL 10 MG PO TABS: 60.0000 mg | ORAL_TABLET | Freq: Once | ORAL | Status: DC

## 2018-04-27 MED ORDER — NITROGLYCERIN 0.4 MG SL SUBL: 0.4000 mg | SUBLINGUAL_TABLET | SUBLINGUAL | 1 refills | Status: DC | PRN

## 2018-04-27 MED ORDER — PRASUGREL HCL 10 MG PO TABS: 10.0000 mg | ORAL_TABLET | Freq: Every day | ORAL | 6 refills | Status: DC

## 2018-04-27 MED ORDER — ANGIOPLASTY BOOK
Freq: Once | Status: AC
  Administered 2018-04-27: 1
  Filled 2018-04-27: qty 1

## 2018-04-27 MED ORDER — LABETALOL HCL 300 MG PO TABS: 300.0000 mg | ORAL_TABLET | Freq: Two times a day (BID) | ORAL | Status: DC

## 2018-04-27 NOTE — Discharge Summary (Signed)
Physician Discharge Summary  [name] MRN: [id number] DOB/AGE: August 23, 1941 77 y.o.  Admit date: 04/25/2018 Discharge date: 04/27/2018  Primary Discharge Diagnosis NSTEMI Acute on chronic diastolic heart failure New LBBB, suspect conduction system disease.  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  Significant Diagnostic Studies:  Echocardiogram 04/26/2018 Left ventricle: Wall thickness was increased in a pattern of   moderate LVH. Systolic function was mildly reduced. The estimated   ejection fraction was in the range of 45% to 50%. Features are   consistent with a pseudonormal left ventricular filling pattern,   with concomitant abnormal relaxation and increased filling   pressure (grade 2 diastolic dysfunction). - Aortic valve: There was mild to moderate regurgitation. - Mitral valve: There was mild to moderate regurgitation. - Left atrium: The atrium was mildly dilated. - Pulmonary arteries: PA peak pressure: 37 mm Hg (S).  Impressions:  - No significant change compared to previous outpatient   echocardiogram dated 12/23/2016.  Coronary angiogram 04/26/2018 LM: Normal LAD: Patent ostial LAD stent, Resolute Onyx 3.0 X 15 mm drug-eluting stent placed 12/29/2017. LCx: Mid 40% stenosis Ramus intermedius: Bifurcation 1,1,1 stenosis  Successful percutaneous coronary intervention      PTCA and stenting Resolute 2.0 X 12 mm Ramus intermedius, 0% residual stenosis, TIMI III flow     PTCA Ramus side branch with 75% ostial stenosis with TIMI III flow RCA: Mild nonobstructive disease mid RCA LVEDP 17 mmHg  EKG 04/27/2018: Normal sinus rhythm with left bundle branch block.  No further analysis.  EKG initial EKG at presentation on 04/26/2018 reveals normal sinus rhythm  with left bundle branch block.  Repeat EKG reveals normal sinus rhythm with LVH with repolarization abnormality unchanged from prior EKG.  Hospital Course: Patient admitted with pulmonary edema and chest pain, needing respiratory resuscitation with BiPAP and aggressive diuresis.  She ruled in for MI with mild leak in serum troponin.  Underwent coronary angiography the following day and successful revascularization of ramus intermediate stenosis which is high-grade.  Patient also developed intermittent left bundle branch block.  She presented with LBBB which converted back to regular rhythm with LVH and repolarization.  Post revitalization she remained stable without recurrence of chest pain or dyspnea, felt stable for discharge with outpatient management.  Plavix was discontinued and switched to Prasugrel, Brilinta was not started in view of underlying LBBB.  Recommendations on discharge: She will continue with dual antiplatelet therapy for at least one year.  Aggressive control of diabetes, hypertension is indicated.  Continue study drug for diabetes. We will hold spironolactone and Diovan HCT for now until renal function is stable on the outpatient basis evaluation.  Discharge Exam: Blood pressure (!) 132/39, pulse 72, temperature 97.6 F (36.4 C), resp. rate 18, height 5\' 5"  (1.651 m), weight 82 kg (180 lb 12.4 oz), SpO2 100 %.  General appearance: alert, cooperative, appears stated age and no distress Resp: clear to auscultation bilaterally Cardio: S1, S2 normal and systolic murmur: early systolic 2/6, crescendo at apex GI: soft, non-tender; bowel sounds normal; no masses,  no organomegaly Extremities: edema 1-2 plus pitting Pulses: Bilateral carotid normal, femoral pulses normal, 1+ popliteal pulse and absent pedal pulses. Neurologic: Grossly normal Labs:   Lab Results  Component Value Date   WBC 6.1 04/26/2018   HGB 12.2 04/26/2018   HCT 37.7 04/26/2018   MCV 91.5 04/26/2018  PLT  144 (L) 04/26/2018    Recent Labs  Lab 04/25/18 1256  04/27/18 0244  NA 141   < > 142  K 4.8   < > 4.2  CL 108   < > 105  CO2 23   < > 21*  BUN 17   < > 25*  CREATININE 1.27*   < > 1.35*  CALCIUM 9.1   < > 8.8*  PROT 7.3  --   --   BILITOT 0.8  --   --   ALKPHOS 97  --   --   ALT 14  --   --   AST 16  --   --   GLUCOSE 364*   < > 236*   < > = values in this interval not displayed.   BNP (last 3 results) Recent Labs    04/25/18 1256  BNP 549.5*    HEMOGLOBIN A1C Lab Results  Component Value Date   HGBA1C 9.6 (H) 06/27/2016   MPG 229 06/27/2016    Cardiac Panel (last 3 results) Recent Labs    04/25/18 1659 04/25/18 2328 04/26/18 0633  TROPONINI 0.59* 0.72* 0.53*   Radiology: Dg Chest Port 1 View  Result Date: 04/25/2018 CLINICAL DATA:  Shortness of breath EXAM: PORTABLE CHEST 1 VIEW COMPARISON:  Mar 24, 2015 FINDINGS: There is generalized interstitial edema. There are small pleural effusions bilaterally. There is patchy consolidation in the medial left base. Heart is mildly enlarged with pulmonary vascularity within normal limits. No adenopathy. Thoracic stimulator leads are in the midthoracic region. Postoperative change noted in the right shoulder. There is evidence of previous surgery involving the lateral right clavicle. IMPRESSION: Interstitial edema with small pleural effusions. Question a degree of congestive heart failure. Heart mildly enlarged. Mild airspace consolidation medial left base may represent alveolar edema but also could represent a focal area of pneumonia. Electronically Signed   By: Lowella Grip III M.D.   On: 04/25/2018 13:20   FOLLOW UP PLANS AND APPOINTMENTS Discharge Instructions    Amb Referral to Cardiac Rehabilitation   Complete by:  As directed    Diagnosis:   Coronary Stents NSTEMI       Allergies as of 04/27/2018      Reactions   Rosiglitazone Maleate Anaphylaxis, Swelling   Morphine Other (See Comments)   SEVERE HYPOTENSION    Sulfa Antibiotics Diarrhea, Nausea And Vomiting   Elavil [amitriptyline] Nausea Only   Tramadol-acetaminophen Rash      Medication List    STOP taking these medications   clopidogrel 75 MG tablet Commonly known as:  PLAVIX   furosemide 20 MG tablet Commonly known as:  LASIX   olmesartan-hydrochlorothiazide 40-25 MG tablet Commonly known as:  BENICAR HCT   spironolactone 50 MG tablet Commonly known as:  ALDACTONE   valsartan-hydrochlorothiazide 320-12.5 MG tablet Commonly known as:  DIOVAN-HCT     TAKE these medications   amLODipine 5 MG tablet Commonly known as:  NORVASC Take 1 tablet (5 mg total) by mouth daily.   BAYER CHILDRENS ASPIRIN 81 MG chewable tablet Generic drug:  aspirin Chew 81 mg by mouth daily.   busPIRone 7.5 MG tablet Commonly known as:  BUSPAR Take 7.5 mg by mouth 2 (two) times daily.   CALCIUM 600 + D PO Take 1 tablet by mouth daily.   cefpodoxime 200 MG tablet Commonly known as:  VANTIN TAKE ONE TABLET (200 MG DOSE) BY MOUTH 2 (TWO) TIMES DAILY.   ezetimibe 10 MG tablet Commonly  known as:  ZETIA Take 10 mg by mouth daily.   Fish Oil 1000 MG Caps Take 1,000 mg by mouth 2 (two) times daily. OTC fish oil   HUMALOG 100 UNIT/ML injection Generic drug:  insulin lispro Inject 56 Units into the skin See admin instructions. For [place] e with V - GO 20 Insulin Delivery Device. Total : 56 Units / Day.   insulin aspart 100 UNIT/ML injection Commonly known as:  novoLOG Inject into the skin See admin instructions. Pt has [place]   Investigational - Study Medication Take 1 tablet by mouth daily. Study name: dapagliflozin/metformin ER 10/1000mg  Additional study details: This is a Drug Study medication from Dr. Nadyne Coombes at [facility]. Patient started taking this medication on 04/22/18 and is unsure of how long she is to take this medication for. Per patient, she is on this medication as part of a Heart and Diabetes drug study. Start taking  on:  04/28/2018 What changed:    how much to take  how to take this  when to take this   isosorbide mononitrate 60 MG 24 hr tablet Commonly known as:  IMDUR Take 60 mg by mouth daily.   labetalol 300 MG tablet Commonly known as:  NORMODYNE Take 1 tablet (300 mg total) by mouth 2 (two) times daily.   levothyroxine 50 MCG tablet Commonly known as:  SYNTHROID, LEVOTHROID Take 50 mcg by mouth every morning.   nitroGLYCERIN 0.4 MG SL tablet Commonly known as:  NITROSTAT Place 1 tablet (0.4 mg total) under the tongue every 5 (five) minutes x 3 doses as needed for chest pain.   pantoprazole 40 MG tablet Commonly known as:  PROTONIX Take 1 tablet (40 mg total) by mouth 2 (two) times daily.   potassium chloride SA 20 MEQ tablet Commonly known as:  KLOR-CON M20 Take 1 tablet (20 mEq total) by mouth 2 (two) times daily. With Torsemide as needed for leg swelling What changed:  additional instructions   prasugrel 10 MG Tabs tablet Commonly known as:  EFFIENT Take 1 tablet (10 mg total) by mouth daily. Start taking on:  04/28/2018   rosuvastatin 40 MG tablet Commonly known as:  CRESTOR Take 1 tablet (40 mg total) by mouth at bedtime.   torsemide 20 MG tablet Commonly known as:  DEMADEX TAKE 1 TABLET DAILY AS NEEDED FOR LEG EDEMA   Vitamin D (Ergocalciferol) 50000 units Caps capsule Commonly known as:  DRISDOL Take 50,000 Units by mouth every 7 (seven) days.       Adrian Prows, MD 04/27/2018, 10:03 AM   Pager: [phone] Office: [phone] If no answer: [phone]

## 2018-04-27 NOTE — Care Management Note (Addendum)
Case Management Note  Patient Details  Name: Sara Graves MRN: [id number] Date of Birth: Sep 23, 1941  Subjective/Objective:    NSTEMI, CHF, New LBBB                Action/Plan:  Chesley Mires, RN        # 2. S/W JAMES @ [place] [facility] # [phone]    EFFIENT  10 MG QD TABS 30  COVER- YES'  CO-PAY- $ 64.00  TIER- 3 DRUG  PRIOR APPROVAL- NO   PREFERRED PHARMACY : [facility]- [facility]    [facility] and [facility] do [facility] in [facility]. Copay price is $60.   Expected Discharge Date:  04/27/18               Expected Discharge Plan:  Home/Self Care  In-House Referral:  NA  Discharge planning Services  CM Consult, Medication Assistance  Post Acute Care Choice:  NA Choice offered to:  NA  DME Arranged:  N/A DME Agency:  NA  HH Arranged:  NA HH Agency:  NA  Status of Service:  Completed, signed off  If discussed at [place] of Stay Meetings, dates discussed:    Additional Comments:  Erenest Rasher, RN 04/27/2018, 12:05 PM

## 2018-04-27 NOTE — Progress Notes (Signed)
Notified Dr. Virgina Jock that patient was unable to take medications last night because of nausea and was given IV medications to control blood pressure. Feeling better this morning, eating breakfast and denies nausea.  1100Patient tolerated po meds with c/o of nausea this morning

## 2018-04-27 NOTE — Progress Notes (Signed)
CARDIAC REHAB PHASE I   PRE:  Rate/Rhythm: 88 SR  BP:  Supine: 126/39  Sitting:   Standing:    SaO2: 88-93%RA  MODE:  Ambulation: 300 ft   POST:  Rate/Rhythm: 80 SR  BP:  Supine:   Sitting: 130/66  Standing:    SaO2: 96%RA hall and 90 % room 0850-1010 Pt incontinent of urine as soon as purewick discontinued and pt stood. Assisted to bathroom and helped pt with new footies and put on mesh panty and pad after cleaning up. Pt weaker than she thought she would be. Had her use rolling walker and encouraged her to use walker at home. She stated she has access of rollator and encouraged her to get it and use. Pt denied CP and sats checked in hallway. Gave pt CHF booklet and encouraged low sodium and daily weights. Went over signs/symptoms of when to call MD. Reviewed NTG use and importance of antiplatelet. Will refer to [place] [facility] again as I made referral when I saw pt in February. Reviewed MI restrictions and encouraged pt to take care of herself as she has been taking care of her son who is wheelchair bound. Encouraged walking three times daily short walks and increasing as she can.    Graylon Good, RN BSN  04/27/2018 10:01 AM

## 2018-04-30 MED FILL — Nitroglycerin IV Soln 100 MCG/ML in D5W: INTRA_ARTERIAL | Qty: 10 | Status: AC

## 2018-04-30 MED FILL — Nitroglycerin IV Soln 100 MCG/ML in D5W: INTRA_ARTERIAL | Qty: 10 | Status: CN

## 2018-04-30 MED FILL — Heparin Sod (Porcine)-NaCl IV Soln 1000 Unit/500ML-0.9%: INTRAVENOUS | Qty: 1000 | Status: CN

## 2018-04-30 MED FILL — Heparin Sod (Porcine)-NaCl IV Soln 1000 Unit/500ML-0.9%: INTRAVENOUS | Qty: 1000 | Status: AC

## 2018-05-10 ENCOUNTER — Telehealth (HOSPITAL_COMMUNITY): Payer: Self-pay

## 2018-05-10 NOTE — Telephone Encounter (Signed)
Called patient to see if she is interested in the Cardiac Rehab Program - Patient stated she is not interested as she stays busy and active at home. Closed referral.

## 2018-05-10 NOTE — Telephone Encounter (Signed)
Patients insurance is active and benefits verified through [facility] - $20.00 co-pay, no deductible, out of pocket amount of $4,000/$692.78 has been met, no  Co-insurance, and no pre-authorization is required. Passport/reference [id number]  Will contact patient to see if she is interested in the Cardiac Rehab program. If interested, patient will have needed to complete follow up appt which was completed. Called Dr.Ganji's office for office notes. Waiting on fax. Patient will be contacted for scheduling upon review by the RN Navigator.

## 2018-06-09 ENCOUNTER — Ambulatory Visit: Payer: Medicare Other | Admitting: Podiatry

## 2018-06-09 ENCOUNTER — Encounter: Payer: Self-pay | Admitting: Podiatry

## 2018-06-09 ENCOUNTER — Other Ambulatory Visit: Payer: Self-pay | Admitting: Podiatry

## 2018-06-09 ENCOUNTER — Ambulatory Visit (INDEPENDENT_AMBULATORY_CARE_PROVIDER_SITE_OTHER): Payer: Medicare Other

## 2018-06-09 DIAGNOSIS — L97511 Non-pressure chronic ulcer of other part of right foot limited to breakdown of skin: Secondary | ICD-10-CM | POA: Diagnosis not present

## 2018-06-09 DIAGNOSIS — L03119 Cellulitis of unspecified part of limb: Secondary | ICD-10-CM

## 2018-06-09 DIAGNOSIS — L02619 Cutaneous abscess of unspecified foot: Secondary | ICD-10-CM

## 2018-06-09 DIAGNOSIS — L97519 Non-pressure chronic ulcer of other part of right foot with unspecified severity: Secondary | ICD-10-CM

## 2018-06-09 MED ORDER — CEPHALEXIN 500 MG PO CAPS: 500.0000 mg | ORAL_CAPSULE | Freq: Three times a day (TID) | ORAL | 1 refills | Status: DC

## 2018-06-09 NOTE — Progress Notes (Signed)
Subjective:   [name], female   DOB: 77 y.o.   MRN: [id number]   HPI Patient states that the area has broken down again and is turn red and has been sore over the last few days.  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  I then placed on cephalexin 500 mg 3 times daily gave strict instructions of any increased redness to let us know immediately seen back again in several weeks and at one point in future we may have to consider metatarsal head resection  X-ray indicates that the patient does not have current ostial lysis

## 2018-06-11 ENCOUNTER — Telehealth: Payer: Self-pay | Admitting: Podiatry

## 2018-06-11 NOTE — Telephone Encounter (Signed)
Pt states she is in constant pain and can't walk, may have more swelling. I told pt to continue the antibiotic and asked if she could come in office to get a surgical shoe to decrease pressure and bending in the area, have change the dressing as instructed by [name], go to the ED if increase swelling, redness, drainage, fever and or streaking red from the site. Pt states she will try to get to the office for a surgical shoe, I told her if she was not able to get to the office for the surgical shoe to go into a stiff bottom wide shoe to decrease bend to the area and pressure from the sides. I told pt I would like to get her in to be seen earlier than 06/24/2018 and transferred to schedulers.

## 2018-06-11 NOTE — Telephone Encounter (Signed)
I saw [name] on Wednesday and he did a little procedure to my right foot. Ever since then I've been in pain, I'm unable to sleep, and it hurts to walk. I wanted to know if there is something else I can do to help the pain? I don't have a follow up appointment with [name] until 08 August. My number is [phone].

## 2018-06-14 ENCOUNTER — Encounter: Payer: Self-pay | Admitting: Podiatry

## 2018-06-14 ENCOUNTER — Inpatient Hospital Stay (HOSPITAL_COMMUNITY): Payer: Medicare Other

## 2018-06-14 ENCOUNTER — Emergency Department (HOSPITAL_COMMUNITY): Payer: Medicare Other

## 2018-06-14 ENCOUNTER — Ambulatory Visit: Payer: Medicare Other | Admitting: Podiatry

## 2018-06-14 ENCOUNTER — Encounter (HOSPITAL_COMMUNITY): Payer: Self-pay

## 2018-06-14 ENCOUNTER — Inpatient Hospital Stay (HOSPITAL_COMMUNITY)
Admission: EM | Admit: 2018-06-14 | Discharge: 2018-06-19 | DRG: 988 | Disposition: A | Discharge status: Home / Self Care | Payer: Medicare Other | Attending: Internal Medicine | Admitting: Internal Medicine

## 2018-06-14 ENCOUNTER — Other Ambulatory Visit: Payer: Self-pay

## 2018-06-14 ENCOUNTER — Telehealth: Payer: Self-pay | Admitting: *Deleted

## 2018-06-14 VITALS — BP 154/74 | HR 89 | Temp 101.9°F | Resp 16

## 2018-06-14 DIAGNOSIS — F419 Anxiety disorder, unspecified: Secondary | ICD-10-CM | POA: Diagnosis present

## 2018-06-14 DIAGNOSIS — I10 Essential (primary) hypertension: Secondary | ICD-10-CM | POA: Diagnosis present

## 2018-06-14 DIAGNOSIS — E669 Obesity, unspecified: Secondary | ICD-10-CM | POA: Diagnosis present

## 2018-06-14 DIAGNOSIS — Z955 Presence of coronary angioplasty implant and graft: Secondary | ICD-10-CM

## 2018-06-14 DIAGNOSIS — L97519 Non-pressure chronic ulcer of other part of right foot with unspecified severity: Secondary | ICD-10-CM | POA: Diagnosis present

## 2018-06-14 DIAGNOSIS — Z6832 Body mass index (BMI) 32.0-32.9, adult: Secondary | ICD-10-CM

## 2018-06-14 DIAGNOSIS — R42 Dizziness and giddiness: Secondary | ICD-10-CM | POA: Diagnosis not present

## 2018-06-14 DIAGNOSIS — I5042 Chronic combined systolic (congestive) and diastolic (congestive) heart failure: Secondary | ICD-10-CM | POA: Diagnosis not present

## 2018-06-14 DIAGNOSIS — L03032 Cellulitis of left toe: Secondary | ICD-10-CM | POA: Diagnosis present

## 2018-06-14 DIAGNOSIS — Z885 Allergy status to narcotic agent status: Secondary | ICD-10-CM

## 2018-06-14 DIAGNOSIS — E1169 Type 2 diabetes mellitus with other specified complication: Secondary | ICD-10-CM | POA: Diagnosis present

## 2018-06-14 DIAGNOSIS — E1165 Type 2 diabetes mellitus with hyperglycemia: Secondary | ICD-10-CM | POA: Diagnosis not present

## 2018-06-14 DIAGNOSIS — M1711 Unilateral primary osteoarthritis, right knee: Secondary | ICD-10-CM | POA: Diagnosis present

## 2018-06-14 DIAGNOSIS — I2510 Atherosclerotic heart disease of native coronary artery without angina pectoris: Secondary | ICD-10-CM | POA: Diagnosis not present

## 2018-06-14 DIAGNOSIS — L03119 Cellulitis of unspecified part of limb: Secondary | ICD-10-CM | POA: Diagnosis not present

## 2018-06-14 DIAGNOSIS — F329 Major depressive disorder, single episode, unspecified: Secondary | ICD-10-CM | POA: Diagnosis not present

## 2018-06-14 DIAGNOSIS — Z5189 Encounter for other specified aftercare: Secondary | ICD-10-CM

## 2018-06-14 DIAGNOSIS — D631 Anemia in chronic kidney disease: Secondary | ICD-10-CM | POA: Diagnosis present

## 2018-06-14 DIAGNOSIS — L03115 Cellulitis of right lower limb: Secondary | ICD-10-CM | POA: Diagnosis present

## 2018-06-14 DIAGNOSIS — K219 Gastro-esophageal reflux disease without esophagitis: Secondary | ICD-10-CM | POA: Diagnosis present

## 2018-06-14 DIAGNOSIS — IMO0002 Reserved for concepts with insufficient information to code with codable children: Secondary | ICD-10-CM | POA: Diagnosis present

## 2018-06-14 DIAGNOSIS — L02611 Cutaneous abscess of right foot: Secondary | ICD-10-CM | POA: Diagnosis present

## 2018-06-14 DIAGNOSIS — E785 Hyperlipidemia, unspecified: Secondary | ICD-10-CM | POA: Diagnosis present

## 2018-06-14 DIAGNOSIS — Z85828 Personal history of other malignant neoplasm of skin: Secondary | ICD-10-CM

## 2018-06-14 DIAGNOSIS — I214 Non-ST elevation (NSTEMI) myocardial infarction: Secondary | ICD-10-CM | POA: Diagnosis not present

## 2018-06-14 DIAGNOSIS — D696 Thrombocytopenia, unspecified: Secondary | ICD-10-CM | POA: Diagnosis present

## 2018-06-14 DIAGNOSIS — L03039 Cellulitis of unspecified toe: Secondary | ICD-10-CM | POA: Diagnosis not present

## 2018-06-14 DIAGNOSIS — M79609 Pain in unspecified limb: Secondary | ICD-10-CM | POA: Diagnosis not present

## 2018-06-14 DIAGNOSIS — Z888 Allergy status to other drugs, medicaments and biological substances status: Secondary | ICD-10-CM

## 2018-06-14 DIAGNOSIS — Z79890 Hormone replacement therapy (postmenopausal): Secondary | ICD-10-CM

## 2018-06-14 DIAGNOSIS — E11628 Type 2 diabetes mellitus with other skin complications: Secondary | ICD-10-CM

## 2018-06-14 DIAGNOSIS — I130 Hypertensive heart and chronic kidney disease with heart failure and stage 1 through stage 4 chronic kidney disease, or unspecified chronic kidney disease: Secondary | ICD-10-CM | POA: Diagnosis not present

## 2018-06-14 DIAGNOSIS — E1149 Type 2 diabetes mellitus with other diabetic neurological complication: Secondary | ICD-10-CM | POA: Diagnosis not present

## 2018-06-14 DIAGNOSIS — Z96651 Presence of right artificial knee joint: Secondary | ICD-10-CM | POA: Diagnosis present

## 2018-06-14 DIAGNOSIS — N183 Chronic kidney disease, stage 3 (moderate): Secondary | ICD-10-CM | POA: Diagnosis present

## 2018-06-14 DIAGNOSIS — E11622 Type 2 diabetes mellitus with other skin ulcer: Secondary | ICD-10-CM | POA: Diagnosis present

## 2018-06-14 DIAGNOSIS — L089 Local infection of the skin and subcutaneous tissue, unspecified: Secondary | ICD-10-CM | POA: Diagnosis not present

## 2018-06-14 DIAGNOSIS — E1122 Type 2 diabetes mellitus with diabetic chronic kidney disease: Secondary | ICD-10-CM | POA: Diagnosis present

## 2018-06-14 DIAGNOSIS — E039 Hypothyroidism, unspecified: Secondary | ICD-10-CM | POA: Diagnosis present

## 2018-06-14 DIAGNOSIS — L0390 Cellulitis, unspecified: Secondary | ICD-10-CM | POA: Diagnosis not present

## 2018-06-14 DIAGNOSIS — L97309 Non-pressure chronic ulcer of unspecified ankle with unspecified severity: Secondary | ICD-10-CM

## 2018-06-14 DIAGNOSIS — E11621 Type 2 diabetes mellitus with foot ulcer: Secondary | ICD-10-CM | POA: Diagnosis present

## 2018-06-14 DIAGNOSIS — M2010 Hallux valgus (acquired), unspecified foot: Secondary | ICD-10-CM | POA: Diagnosis not present

## 2018-06-14 DIAGNOSIS — T148XXA Other injury of unspecified body region, initial encounter: Secondary | ICD-10-CM

## 2018-06-14 DIAGNOSIS — E042 Nontoxic multinodular goiter: Secondary | ICD-10-CM | POA: Diagnosis present

## 2018-06-14 DIAGNOSIS — Z794 Long term (current) use of insulin: Secondary | ICD-10-CM

## 2018-06-14 DIAGNOSIS — I252 Old myocardial infarction: Secondary | ICD-10-CM

## 2018-06-14 DIAGNOSIS — Z882 Allergy status to sulfonamides status: Secondary | ICD-10-CM

## 2018-06-14 DIAGNOSIS — E1140 Type 2 diabetes mellitus with diabetic neuropathy, unspecified: Secondary | ICD-10-CM | POA: Diagnosis present

## 2018-06-14 DIAGNOSIS — Z8249 Family history of ischemic heart disease and other diseases of the circulatory system: Secondary | ICD-10-CM

## 2018-06-14 DIAGNOSIS — Z79899 Other long term (current) drug therapy: Secondary | ICD-10-CM

## 2018-06-14 LAB — BASIC METABOLIC PANEL
Anion gap: 11 (ref 5–15)
BUN: 26 mg/dL — AB (ref 8–23)
CHLORIDE: 105 mmol/L (ref 98–111)
CO2: 22 mmol/L (ref 22–32)
CREATININE: 1.03 mg/dL — AB (ref 0.44–1.00)
Calcium: 9 mg/dL (ref 8.9–10.3)
GFR calc non Af Amer: 51 mL/min — ABNORMAL LOW (ref >60)
GFR, EST AFRICAN AMERICAN: 59 mL/min — AB (ref >60)
Glucose, Bld: 160 mg/dL — ABNORMAL HIGH (ref 70–99)
Potassium: 3.8 mmol/L (ref 3.5–5.1)
Sodium: 138 mmol/L (ref 135–145)

## 2018-06-14 LAB — CBC WITH DIFFERENTIAL/PLATELET
Basophils Absolute: 0 10*3/uL (ref 0.0–0.1)
Basophils Relative: 0 %
Eosinophils Absolute: 0 10*3/uL (ref 0.0–0.7)
Eosinophils Relative: 0 %
HEMATOCRIT: 34.1 % — AB (ref 36.0–46.0)
HEMOGLOBIN: 11.4 g/dL — AB (ref 12.0–15.0)
LYMPHS ABS: 0.9 10*3/uL (ref 0.7–4.0)
Lymphocytes Relative: 12 %
MCH: 29.6 pg (ref 26.0–34.0)
MCHC: 33.4 g/dL (ref 30.0–36.0)
MCV: 88.6 fL (ref 78.0–100.0)
MONOS PCT: 10 %
Monocytes Absolute: 0.8 10*3/uL (ref 0.1–1.0)
NEUTROS ABS: 5.7 10*3/uL (ref 1.7–7.7)
NEUTROS PCT: 78 %
Platelets: 128 10*3/uL — ABNORMAL LOW (ref 150–400)
RBC: 3.85 MIL/uL — ABNORMAL LOW (ref 3.87–5.11)
RDW: 14.5 % (ref 11.5–15.5)
WBC: 7.3 10*3/uL (ref 4.0–10.5)

## 2018-06-14 LAB — GLUCOSE, CAPILLARY
GLUCOSE-CAPILLARY: 137 mg/dL — AB (ref 70–99)
Glucose-Capillary: 142 mg/dL — ABNORMAL HIGH (ref 70–99)

## 2018-06-14 LAB — I-STAT CG4 LACTIC ACID, ED: Lactic Acid, Venous: 0.93 mmol/L (ref 0.5–1.9)

## 2018-06-14 MED ORDER — INSULIN ASPART 100 UNIT/ML ~~LOC~~ SOLN
0.0000 [IU] | Freq: Every day | SUBCUTANEOUS | Status: DC
  Administered 2018-06-15 – 2018-06-17 (×2): 2 [IU] via SUBCUTANEOUS
  Administered 2018-06-18: 3 [IU] via SUBCUTANEOUS

## 2018-06-14 MED ORDER — PANTOPRAZOLE SODIUM 40 MG PO TBEC
40.0000 mg | DELAYED_RELEASE_TABLET | Freq: Two times a day (BID) | ORAL | Status: DC
  Administered 2018-06-14 – 2018-06-19 (×9): 40 mg via ORAL
  Filled 2018-06-14 (×9): qty 1

## 2018-06-14 MED ORDER — IOPAMIDOL (ISOVUE-300) INJECTION 61%
INTRAVENOUS | Status: AC
  Filled 2018-06-14: qty 100

## 2018-06-14 MED ORDER — AMLODIPINE BESYLATE 5 MG PO TABS
5.0000 mg | ORAL_TABLET | Freq: Every day | ORAL | Status: DC
  Administered 2018-06-14 – 2018-06-19 (×5): 5 mg via ORAL
  Filled 2018-06-14 (×5): qty 1

## 2018-06-14 MED ORDER — ASPIRIN 81 MG PO CHEW
81.0000 mg | CHEWABLE_TABLET | Freq: Every day | ORAL | Status: DC
  Administered 2018-06-14 – 2018-06-19 (×6): 81 mg via ORAL
  Filled 2018-06-14 (×6): qty 1

## 2018-06-14 MED ORDER — SODIUM CHLORIDE 0.9 % IV SOLN
1250.0000 mg | INTRAVENOUS | Status: DC
  Administered 2018-06-16 – 2018-06-17 (×2): 1250 mg via INTRAVENOUS
  Filled 2018-06-14 (×2): qty 1250

## 2018-06-14 MED ORDER — IOPAMIDOL (ISOVUE-300) INJECTION 61%
100.0000 mL | Freq: Once | INTRAVENOUS | Status: AC | PRN
  Administered 2018-06-14: 100 mL via INTRAVENOUS

## 2018-06-14 MED ORDER — SODIUM CHLORIDE 0.9 % IV BOLUS
500.0000 mL | Freq: Once | INTRAVENOUS | Status: AC
Start: 2018-06-14 — End: 2018-06-14
  Administered 2018-06-14: 500 mL via INTRAVENOUS

## 2018-06-14 MED ORDER — EZETIMIBE 10 MG PO TABS
10.0000 mg | ORAL_TABLET | Freq: Every day | ORAL | Status: DC
  Administered 2018-06-14 – 2018-06-19 (×5): 10 mg via ORAL
  Filled 2018-06-14 (×5): qty 1

## 2018-06-14 MED ORDER — LABETALOL HCL 200 MG PO TABS
300.0000 mg | ORAL_TABLET | Freq: Two times a day (BID) | ORAL | Status: DC
  Administered 2018-06-14 – 2018-06-17 (×6): 300 mg via ORAL
  Filled 2018-06-14 (×11): qty 1

## 2018-06-14 MED ORDER — PIPERACILLIN-TAZOBACTAM 3.375 G IVPB
3.3750 g | Freq: Three times a day (TID) | INTRAVENOUS | Status: DC
  Administered 2018-06-14 – 2018-06-19 (×15): 3.375 g via INTRAVENOUS
  Filled 2018-06-14 (×14): qty 50

## 2018-06-14 MED ORDER — VANCOMYCIN HCL 10 G IV SOLR
1500.0000 mg | INTRAVENOUS | Status: AC
  Administered 2018-06-14: 1500 mg via INTRAVENOUS
  Filled 2018-06-14: qty 1500

## 2018-06-14 MED ORDER — ACETAMINOPHEN 325 MG PO TABS
650.0000 mg | ORAL_TABLET | Freq: Four times a day (QID) | ORAL | Status: DC | PRN
  Administered 2018-06-15 – 2018-06-17 (×3): 650 mg via ORAL
  Filled 2018-06-14 (×4): qty 2

## 2018-06-14 MED ORDER — BUSPIRONE HCL 5 MG PO TABS
7.5000 mg | ORAL_TABLET | Freq: Two times a day (BID) | ORAL | Status: DC
  Administered 2018-06-14 – 2018-06-19 (×9): 7.5 mg via ORAL
  Filled 2018-06-14 (×9): qty 2

## 2018-06-14 MED ORDER — POTASSIUM CHLORIDE CRYS ER 20 MEQ PO TBCR
20.0000 meq | EXTENDED_RELEASE_TABLET | Freq: Two times a day (BID) | ORAL | Status: DC
  Administered 2018-06-14 – 2018-06-19 (×10): 20 meq via ORAL
  Filled 2018-06-14 (×10): qty 1

## 2018-06-14 MED ORDER — PIPERACILLIN-TAZOBACTAM 3.375 G IVPB 30 MIN
3.3750 g | Freq: Once | INTRAVENOUS | Status: AC
  Administered 2018-06-14: 3.375 g via INTRAVENOUS
  Filled 2018-06-14 (×2): qty 50

## 2018-06-14 MED ORDER — LEVOTHYROXINE SODIUM 50 MCG PO TABS
50.0000 ug | ORAL_TABLET | Freq: Every day | ORAL | Status: DC
  Administered 2018-06-15 – 2018-06-19 (×4): 50 ug via ORAL
  Filled 2018-06-14 (×4): qty 1

## 2018-06-14 MED ORDER — ENOXAPARIN SODIUM 40 MG/0.4ML ~~LOC~~ SOLN
40.0000 mg | SUBCUTANEOUS | Status: DC
  Administered 2018-06-14: 40 mg via SUBCUTANEOUS
  Filled 2018-06-14: qty 0.4

## 2018-06-14 MED ORDER — ACETAMINOPHEN 650 MG RE SUPP: 650.0000 mg | Freq: Four times a day (QID) | RECTAL | Status: DC | PRN

## 2018-06-14 MED ORDER — ROSUVASTATIN CALCIUM 20 MG PO TABS
40.0000 mg | ORAL_TABLET | Freq: Every day | ORAL | Status: DC
  Administered 2018-06-14 – 2018-06-18 (×5): 40 mg via ORAL
  Filled 2018-06-14: qty 2
  Filled 2018-06-14: qty 1
  Filled 2018-06-14 (×4): qty 2

## 2018-06-14 MED ORDER — ISOSORBIDE MONONITRATE ER 60 MG PO TB24
60.0000 mg | ORAL_TABLET | Freq: Every day | ORAL | Status: DC
  Administered 2018-06-14 – 2018-06-19 (×5): 60 mg via ORAL
  Filled 2018-06-14 (×5): qty 1

## 2018-06-14 MED ORDER — PRASUGREL HCL 10 MG PO TABS
10.0000 mg | ORAL_TABLET | Freq: Every day | ORAL | Status: DC
  Administered 2018-06-14 – 2018-06-19 (×5): 10 mg via ORAL
  Filled 2018-06-14 (×6): qty 1

## 2018-06-14 MED ORDER — INSULIN ASPART 100 UNIT/ML ~~LOC~~ SOLN
0.0000 [IU] | Freq: Three times a day (TID) | SUBCUTANEOUS | Status: DC
  Administered 2018-06-14: 2 [IU] via SUBCUTANEOUS
  Administered 2018-06-15: 5 [IU] via SUBCUTANEOUS
  Administered 2018-06-15: 3 [IU] via SUBCUTANEOUS
  Administered 2018-06-16 (×2): 2 [IU] via SUBCUTANEOUS
  Administered 2018-06-17 – 2018-06-18 (×3): 3 [IU] via SUBCUTANEOUS
  Administered 2018-06-18 (×2): 8 [IU] via SUBCUTANEOUS
  Administered 2018-06-19: 3 [IU] via SUBCUTANEOUS
  Administered 2018-06-19: 5 [IU] via SUBCUTANEOUS

## 2018-06-14 NOTE — ED Triage Notes (Signed)
Pt arrives POV from home. Pt reports she has had infection in her right foot. Pt was seen by MD last week twice for worsening symptoms. Pt reports that the MD advised to come to ED due to worsening infection and the need for "strong abx" Pt is febrile In triage

## 2018-06-14 NOTE — Consult Note (Signed)
Reason for Consult: right foot diabetic infection Referring Physician: Dr. Philbert Riser is an 77 y.o. female.  HPI: ms. Duling was admitted to the hospital today for right foot infection, cellulitis.  She has been under the care of Dr. Ila Mcgill for a wound on the right foot and was seen in the office today by him and was sent to the ER due to worsening of cellulitis and infection despite PO antibiotics.  She is also been having increasing pain of the right foot.  She was found to have a fever 102 in the office today.  They have previously discussed having a 5 metatarsal head resection performed.  Past Medical History:  Diagnosis Date  . Anxiety   . Arthritis    "hands" (12/29/2017)  . Basal cell carcinoma of nose    removed  . Bursitis of left shoulder   . Cat scratch fever    Late 90s  . Coronary artery disease   . Depression   . Diabetes mellitus, type II, insulin dependent ([place])   . GERD (gastroesophageal reflux disease)   . H. pylori infection 2008 and 1998   treated  . Hyperlipidemia   . Hypertension   . Hypothyroidism   . Multinodular goiter   . Rotator cuff tear, left recurrent   . Urge urinary incontinence   . UTI (lower urinary tract infection) 05/2016    Past Surgical History:  Procedure Laterality Date  . APPENDECTOMY  AGE 73  . BACK SURGERY    . BASAL CELL CARCINOMA EXCISION     "nose"  . BLADDER NECK SUSPENSION  1970's  . BUNIONECTOMY WITH HAMMERTOE RECONSTRUCTION Bilateral   . CARPAL TUNNEL RELEASE Right 05/2017  . COLONOSCOPY W/ POLYPECTOMY    . CORONARY ANGIOPLASTY WITH STENT PLACEMENT  12/29/2017  . CORONARY STENT INTERVENTION N/A 12/29/2017   Procedure: CORONARY STENT INTERVENTION;  Surgeon: Nigel Mormon, MD;  Location: [facility];  Service: Cardiovascular;  Laterality: N/A;  . CORONARY STENT INTERVENTION N/A 04/26/2018   Procedure: CORONARY STENT INTERVENTION;  Surgeon: Nigel Mormon, MD;  Location: [facility];  Service: Cardiovascular;  Laterality: N/A;  . ESOPHAGOGASTRODUODENOSCOPY ENDOSCOPY    . FORAMINAL DECOMPRESSION AT L2 TO THE SACRUM  01-05-2008   L2  -  S1  . GANGLION CYST EXCISION Left 01/17/2009   ring finger  . IMPLANTATION PERMANENT SPINAL CORD STIMULATOR  06-15-2008   JUNE 2013--  BATTERY CHANGE  . JOINT REPLACEMENT    . LEFT HEART CATH AND CORONARY ANGIOGRAPHY N/A 04/26/2018   Procedure: LEFT HEART CATH AND CORONARY ANGIOGRAPHY;  Surgeon: Nigel Mormon, MD;  Location: [facility];  Service: Cardiovascular;  Laterality: N/A;  . LIPOMA EXCISION Right    RIGHT ELBOW  . RIGHT/LEFT HEART CATH AND CORONARY ANGIOGRAPHY N/A 12/29/2017   Procedure: RIGHT/LEFT HEART CATH AND CORONARY ANGIOGRAPHY;  Surgeon: Nigel Mormon, MD;  Location: [facility];  Service: Cardiovascular;  Laterality: N/A;  . SHOULDER OPEN ROTATOR CUFF REPAIR  09/07/2012   Procedure: ROTATOR CUFF REPAIR SHOULDER OPEN;  Surgeon: Magnus Sinning, MD;  Location: [facility];  Service: Orthopedics;  Laterality: Left;  OPEN ANTERIOR ACROMIONECTOMY AND ROTATOR CUFF REPAIR ON LEFT   . SHOULDER OPEN ROTATOR CUFF REPAIR Left 12/28/2012   Procedure: ROTATOR CUFF REPAIR SHOULDER OPEN;  Surgeon: Magnus Sinning, MD;  Location: [facility];  Service: Orthopedics;  Laterality: Left;  OPEN SHOULDER ROTATOR CUFF  REPAIR ON LEFT  WITH ANTERIOR ACROMINECTOMY   . SHOULDER OPEN ROTATOR CUFF REPAIR Right 03/28/2003   RIGHT SHOULDER  DEGENERATIVE AC JOINT AND RC TEAR  . SPINAL CORD STIMULATOR BATTERY EXCHANGE N/A 07/03/2016   Procedure: SPINAL CORD STIMULATOR BATTERY PLACMENT;  Surgeon: Melina Schools, MD;  Location: [facility];  Service: Orthopedics;  Laterality: N/A;  . TONSILLECTOMY  AGE 29  . 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 VAGINAL HYSTERECTOMY  1970's    Family History  Problem Relation Age of Onset  . Cancer Mother        breast  . Heart Problems  Mother   . Prostate cancer Father   . Muscular dystrophy Son   . Cancer Maternal Grandmother        breast  . Heart disease Maternal Grandfather   . Breast cancer Sister     Social History:  reports that she has never smoked. She has never used smokeless tobacco. She reports that she does not drink alcohol or use drugs.  Allergies:  Allergies  Allergen Reactions  . Rosiglitazone Maleate Anaphylaxis and Swelling  . Morphine Other (See Comments)    SEVERE HYPOTENSION   . Cephalexin Diarrhea  . Sulfa Antibiotics Diarrhea and Nausea And Vomiting  . Elavil [Amitriptyline] Nausea Only  . Tramadol-Acetaminophen Rash    Medications: I have reviewed the patient's current medications.  Results for orders placed or performed during the hospital encounter of 06/14/18 (from the past 48 hour(s))  CBC with Differential/Platelet     Status: Abnormal   Collection Time: 06/14/18 11:55 AM  Result Value Ref Range   WBC 7.3 4.0 - 10.5 K/uL   RBC 3.85 (L) 3.87 - 5.11 MIL/uL   Hemoglobin 11.4 (L) 12.0 - 15.0 g/dL   HCT 34.1 (L) 36.0 - 46.0 %   MCV 88.6 78.0 - 100.0 fL   MCH 29.6 26.0 - 34.0 pg   MCHC 33.4 30.0 - 36.0 g/dL   RDW 14.5 11.5 - 15.5 %   Platelets 128 (L) 150 - 400 K/uL   Neutrophils Relative % 78 %   Neutro Abs 5.7 1.7 - 7.7 K/uL   Lymphocytes Relative 12 %   Lymphs Abs 0.9 0.7 - 4.0 K/uL   Monocytes Relative 10 %   Monocytes Absolute 0.8 0.1 - 1.0 K/uL   Eosinophils Relative 0 %   Eosinophils Absolute 0.0 0.0 - 0.7 K/uL   Basophils Relative 0 %   Basophils Absolute 0.0 0.0 - 0.1 K/uL    Comment: Performed at [facility], [place] [address] [place], [place] [address]  Basic metabolic panel     Status: Abnormal   Collection Time: 06/14/18 11:55 AM  Result Value Ref Range   Sodium 138 135 - 145 mmol/L   Potassium 3.8 3.5 - 5.1 mmol/L   Chloride 105 98 - 111 mmol/L   CO2 22 22 - 32 mmol/L   Glucose, Bld 160 (H) 70 - 99 mg/dL   BUN 26 (H) 8 - 23 mg/dL    Creatinine, Ser 1.03 (H) 0.44 - 1.00 mg/dL   Calcium 9.0 8.9 - 10.3 mg/dL   GFR calc non Af Amer 51 (L) >60 mL/min   GFR calc Af Amer 59 (L) >60 mL/min    Comment: (NOTE) The eGFR has been calculated using the CKD EPI equation. This calculation has not been validated in all clinical situations. eGFR's persistently <60 mL/min signify possible Chronic Kidney Disease.  Anion gap 11 5 - 15    Comment: Performed at [facility], [place] [address] [place], [place] [address]  I-Stat CG4 Lactic Acid, ED     Status: None   Collection Time: 06/14/18 12:04 PM  Result Value Ref Range   Lactic Acid, Venous 0.93 0.5 - 1.9 mmol/L  Glucose, capillary     Status: Abnormal   Collection Time: 06/14/18  4:33 PM  Result Value Ref Range   Glucose-Capillary 142 (H) 70 - 99 mg/dL    Dg Chest 2 View  Result Date: 06/14/2018 CLINICAL DATA:  Right-sided dorsal foot pain. Redness and swelling. No trauma. EXAM: CHEST - 2 VIEW COMPARISON:  April 25, 2018 FINDINGS: Mild cardiomegaly. The hila and mediastinum are normal. No pulmonary nodules, masses, or focal infiltrates. No overt edema. No suspicious infiltrate. IMPRESSION: No active cardiopulmonary disease. Electronically Signed   By: Dorise Bullion III M.D   On: 06/14/2018 12:59   Ct Foot Right W Contrast  Result Date: 06/14/2018 CLINICAL DATA:  Right foot pain and swelling with fever for several days. History of right foot callus debridement. History of diabetes. EXAM: CT OF THE LOWER RIGHT EXTREMITY WITH CONTRAST TECHNIQUE: Multidetector CT imaging of the right foot was performed according to the standard protocol following intravenous contrast administration. COMPARISON:  Radiographs 06/14/2018 and 06/09/2018. CONTRAST:  140m ISOVUE-300 IOPAMIDOL (ISOVUE-300) INJECTION 61% FINDINGS: Bones/Joint/Cartilage There is no evidence of acute fracture, dislocation or bone destruction. The bones appear demineralized. There are stable postsurgical changes  in the 1st and 2nd metatarsal heads with a metallic pin in the 1st metatarsal neck and a cortical screw in the 2nd metatarsal head. The proximal and middle phalanges of the 2nd toe appear fused. Ligaments Suboptimally assessed by CT. Muscles and Tendons The ankle tendons appear grossly intact. No focal muscular abnormalities are seen. Soft tissues The soft tissues appear diffusely edematous. No focal fluid collection, foreign body or soft tissue emphysema identified. There is no obvious skin ulceration. IMPRESSION: 1. Nonspecific generalized soft tissue edema, likely cellulitis. Correlate clinically. No focal fluid collection or skin ulceration identified. 2. No acute osseous findings or evidence of osteomyelitis. 3. Postsurgical changes as described. Electronically Signed   By: WRichardean SaleM.D.   On: 06/14/2018 17:48   Dg Foot Complete Right  Result Date: 06/14/2018 CLINICAL DATA:  Dorsal right foot pain redness, and swelling for the past 2 weeks. No injury. EXAM: RIGHT FOOT COMPLETE - 3+ VIEW COMPARISON:  Right foot series of June 09, 2018 FINDINGS: The bones are subjectively adequately mineralized. There is swelling of the forefoot. The patient has undergone previous bunionectomy with a pin placed at the junction of the shaft and head of the first metatarsal. A cortical screw is present involving the head of the second metatarsal. The phalanges are intact as are the third through fifth metatarsals. The tarsal bones exhibit no acute abnormalities. There are plantar and Achilles region calcaneal spurs. IMPRESSION: There are no acute bony abnormalities. There is soft tissue swelling over the forefoot. Electronically Signed   By: David  JMartiniqueM.D.   On: 06/14/2018 13:02    ROS Blood pressure (!) 154/51, pulse 70, temperature 98.9 F (37.2 C), temperature source Oral, resp. rate 16, weight 195 lb (88.5 kg), SpO2 97 %. Physical Exam General: AAO x3, NAD  Dermatological:  Ulceration present on the  right foot fifth metatarsal head with a granular wound base.  There is edema and erythema to the foot and there is increased  warmth of the foot.  There is no drainage or pus coming from the wound there is no fluctuation or crepitation.  There is no malodor.  On the left foot there is edema and erythema to the second toe.  Again there is no drainage process of fluctuation or crepitation.   RIGHT FOOT:         LEFT FOOT:        Vascular: DP, PT pulses 1/4 bilaterally, CRT less than 3 seconds.  Neruologic: Sensation decreased.  Musculoskeletal: Prominent metatarsal head.  Muscular strength 5/5 in all groups tested bilateral.  Assessment/Plan: Right foot cellulitis with chronic ulceration; left second toe cellulitis -I reviewed the x-rays both the x-rays and CT findings.  I discussed the findings with her today.  At this point I do recommend continue IV antibiotics and she is on vancomycin and Zosyn. -Discussed fifth metatarsal head resection given the chronic ulceration and  infection although CT was negative for osteomyelitis.  Discussed doing this in the hospital versus outpatient procedure. Will tentatively plan on doing this Wednesday afternoon.  Also she recently had a heart attack and stents placed last month.  We will need to make sure she is medically cleared to have surgery. -I will bring a Darco wedge shoe to her tomorrow.  This will help offload the forefoot. -Recommend noninvasive arterial studies given chronic ulceration -Recommend x-ray left foot -Will continue to follow.   Trula Slade 06/14/2018, 7:36 PM  O: [phone] C: [phone]

## 2018-06-14 NOTE — Telephone Encounter (Signed)
[name] states pt is going to [facility] ED for right leg cellulitis, needs IV antibiotics, possible blood work, MRI and will be scheduled for surgery this week. I informed Erline Levine, [facility] ED of pt's arrival and recommended IV antibiotics and treatments.

## 2018-06-14 NOTE — Progress Notes (Signed)
A consult was received from an ED physician for Vancomycin per pharmacy dosing.  The patient's profile has been reviewed for ht/wt/allergies/indication/available labs.   A one time order has been placed for Vancomycin 1500 mg IV.  Further antibiotics/pharmacy consults should be ordered by admitting physician if indicated.                       Thank you, Gretta Arab PharmD, BCPS Pager [phone] 06/14/2018  2:32 PM

## 2018-06-14 NOTE — ED Provider Notes (Signed)
[facility] DEPT Provider Note   CSN: [id number] Arrival date & time: 06/14/18  1029     History   Chief Complaint Chief Complaint  Patient presents with  . Cellulitis    HPI Sara Graves is a 77 y.o. female.  Patient with history of insulin-dependent diabetes, presents with diabetic foot ulcer on the right foot which has become increasingly more red and painful.  Patient developed a fever yesterday.  Fever to 101.9 F this morning at the podiatry office.  Patient has had chills that started yesterday.  She has recently been on Vantin without improvement in her symptoms.  She was sent to the emergency department for IV antibiotics, possible MRI to assess for osteomyelitis.  Patient otherwise denies URI symptoms, chest pain, cough, shortness of breath.  She has not had any vomiting or diarrhea.  No urinary symptoms reported.     Past Medical History:  Diagnosis Date  . Anxiety   . Arthritis    "hands" (12/29/2017)  . Basal cell carcinoma of nose    removed  . Bursitis of left shoulder   . Cat scratch fever    Late 90s  . Coronary artery disease   . Depression   . Diabetes mellitus, type II, insulin dependent ([place])   . GERD (gastroesophageal reflux disease)   . H. pylori infection 2008 and 1998   treated  . Hyperlipidemia   . Hypertension   . Hypothyroidism   . Multinodular goiter   . Rotator cuff tear, left recurrent   . Urge urinary incontinence   . UTI (lower urinary tract infection) 05/2016    Patient Active Problem List   Diagnosis Date Noted  . Acute exacerbation of congestive heart failure ([place]) 04/25/2018  . NSTEMI (non-ST elevated myocardial infarction) ([place]) 04/25/2018  . Post PTCA 12/29/2017  . Shortness of breath 12/27/2017  . Septic joint of left knee joint ([place]) 11/02/2015  . Abdominal pain   . Vomiting 03/24/2015  . Essential hypertension 03/24/2015  . Thrombocytopenia ([place]) 08/31/2014  . Hypokalemia 08/30/2014    . Intractable nausea and vomiting 08/29/2014  . Protein-calorie malnutrition, severe ([place]) 05/16/2014  . Hyperkalemia 05/15/2014  . Unspecified vitamin D deficiency 05/09/2014  . Nausea alone 05/08/2014  . Osteopenia 04/06/2014  . Neuropathy of leg 04/27/2013  . Weight loss, abnormal 03/16/2013  . Left rotator cuff tear 12/29/2012  . Baker's cyst 04/09/2012  . Diabetic neuropathy ([place]) 09/16/2011  . UNSPECIFIED VENOUS INSUFFICIENCY 09/20/2010  . LACTOSE INTOLERANCE 01/23/2009  . Depression 02/04/2007  . Hypothyroidism 01/14/2007  . DM (diabetes mellitus), type 2, uncontrolled ([place]) 01/14/2007  . HYPERCHOLESTEROLEMIA 01/14/2007  . HYPERTENSION, BENIGN SYSTEMIC 01/14/2007  . RHINITIS, ALLERGIC 01/14/2007  . ARTHRITIS 01/14/2007  . SCIATICA 01/14/2007    Past Surgical History:  Procedure Laterality Date  . APPENDECTOMY  AGE 17  . BACK SURGERY    . BASAL CELL CARCINOMA EXCISION     "nose"  . BLADDER NECK SUSPENSION  1970's  . BUNIONECTOMY WITH HAMMERTOE RECONSTRUCTION Bilateral   . CARPAL TUNNEL RELEASE Right 05/2017  . COLONOSCOPY W/ POLYPECTOMY    . CORONARY ANGIOPLASTY WITH STENT PLACEMENT  12/29/2017  . CORONARY STENT INTERVENTION N/A 12/29/2017   Procedure: CORONARY STENT INTERVENTION;  Surgeon: Nigel Mormon, MD;  Location: [facility];  Service: Cardiovascular;  Laterality: N/A;  . CORONARY STENT INTERVENTION N/A 04/26/2018   Procedure: CORONARY STENT INTERVENTION;  Surgeon: Nigel Mormon, MD;  Location: [facility];  Service: Cardiovascular;  Laterality: N/A;  . ESOPHAGOGASTRODUODENOSCOPY ENDOSCOPY    . FORAMINAL DECOMPRESSION AT L2 TO THE SACRUM  01-05-2008   L2  -  S1  . GANGLION CYST EXCISION Left 01/17/2009   ring finger  . IMPLANTATION PERMANENT SPINAL CORD STIMULATOR  06-15-2008   JUNE 2013--  BATTERY CHANGE  . JOINT REPLACEMENT    . LEFT HEART CATH AND CORONARY ANGIOGRAPHY N/A 04/26/2018   Procedure: LEFT HEART CATH AND CORONARY  ANGIOGRAPHY;  Surgeon: Nigel Mormon, MD;  Location: [facility];  Service: Cardiovascular;  Laterality: N/A;  . LIPOMA EXCISION Right    RIGHT ELBOW  . RIGHT/LEFT HEART CATH AND CORONARY ANGIOGRAPHY N/A 12/29/2017   Procedure: RIGHT/LEFT HEART CATH AND CORONARY ANGIOGRAPHY;  Surgeon: Nigel Mormon, MD;  Location: [facility];  Service: Cardiovascular;  Laterality: N/A;  . SHOULDER OPEN ROTATOR CUFF REPAIR  09/07/2012   Procedure: ROTATOR CUFF REPAIR SHOULDER OPEN;  Surgeon: Magnus Sinning, MD;  Location: [facility];  Service: Orthopedics;  Laterality: Left;  OPEN ANTERIOR ACROMIONECTOMY AND ROTATOR CUFF REPAIR ON LEFT   . SHOULDER OPEN ROTATOR CUFF REPAIR Left 12/28/2012   Procedure: ROTATOR CUFF REPAIR SHOULDER OPEN;  Surgeon: Magnus Sinning, MD;  Location: [facility];  Service: Orthopedics;  Laterality: Left;  OPEN SHOULDER ROTATOR CUFF REPAIR ON LEFT  WITH ANTERIOR ACROMINECTOMY   . SHOULDER OPEN ROTATOR CUFF REPAIR Right 03/28/2003   RIGHT SHOULDER  DEGENERATIVE AC JOINT AND RC TEAR  . SPINAL CORD STIMULATOR BATTERY EXCHANGE N/A 07/03/2016   Procedure: SPINAL CORD STIMULATOR BATTERY PLACMENT;  Surgeon: Melina Schools, MD;  Location: [facility];  Service: Orthopedics;  Laterality: N/A;  . TONSILLECTOMY  AGE 41  . 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 VAGINAL HYSTERECTOMY  1970's     OB History   None      Home Medications    Prior to Admission medications   Medication Sig Start Date End Date Taking? Authorizing Provider  amLODipine (NORVASC) 5 MG tablet Take 1 tablet (5 mg total) by mouth daily. 12/31/17  Yes Patwardhan, Manish J, MD  aspirin ([employer] ASPIRIN) 81 MG chewable tablet Chew 81 mg by mouth daily.    Yes [provider]  busPIRone (BUSPAR) 7.5 MG tablet Take 7.5 mg by mouth 2 (two) times daily.   Yes [provider]  Calcium Carb-Cholecalciferol (CALCIUM 600 + D PO)  Take 1 tablet by mouth daily.   Yes [provider]  cefpodoxime (VANTIN) 200 MG tablet TAKE ONE TABLET (200 MG DOSE) BY MOUTH 2 (TWO) TIMES DAILY. 01/23/18  Yes [provider]  dapagliflozin propanediol (FARXIGA) 10 MG TABS tablet Take 10 mg by mouth daily.    Yes [provider]  ezetimibe (ZETIA) 10 MG tablet Take 10 mg by mouth daily.   Yes [provider]  HUMALOG 100 UNIT/ML injection Inject 56 Units into the skin See admin instructions. For [place] e with V - GO 20 Insulin Delivery Device. Total : 56 Units / Day. 04/06/18  Yes [provider]  Investigational - Study Medication Take 1 tablet by mouth daily. Study name: dapagliflozin/metformin ER 10/1000mg  Additional study details: This is a Drug Study medication from Dr. Nadyne Coombes at [facility]. Patient started taking this medication on 04/22/18 and is unsure of how long she is to take this medication for. Per patient, she is on this medication as part of a Heart  and Diabetes drug study. 04/28/18  Yes Patwardhan, Manish J, MD  isosorbide mononitrate (IMDUR) 60 MG 24 hr tablet Take 60 mg by mouth daily.   Yes [provider]  labetalol (NORMODYNE) 300 MG tablet Take 1 tablet (300 mg total) by mouth 2 (two) times daily. 04/27/18  Yes Patwardhan, Manish J, MD  levothyroxine (SYNTHROID, LEVOTHROID) 50 MCG tablet Take 50 mcg by mouth every morning.  08/24/12  Yes Clovis Cao, MD  nitroGLYCERIN (NITROSTAT) 0.4 MG SL tablet Place 1 tablet (0.4 mg total) under the tongue every 5 (five) minutes x 3 doses as needed for chest pain. 04/27/18  Yes Patwardhan, Reynold Bowen, MD  Omega-3 Fatty Acids (FISH OIL) 1000 MG CAPS Take 1,000 mg by mouth 2 (two) times daily. OTC fish oil   Yes [provider]  pantoprazole (PROTONIX) 40 MG tablet Take 1 tablet (40 mg total) by mouth 2 (two) times daily. 12/31/17  Yes Patwardhan, Manish J, MD  potassium chloride SA (KLOR-CON M20) 20 MEQ tablet Take 1 tablet (20 mEq  total) by mouth 2 (two) times daily. With Torsemide as needed for leg swelling 04/27/18  Yes Patwardhan, Manish J, MD  prasugrel (EFFIENT) 10 MG TABS tablet Take 1 tablet (10 mg total) by mouth daily. 04/28/18  Yes Patwardhan, Manish J, MD  rosuvastatin (CRESTOR) 40 MG tablet Take 1 tablet (40 mg total) by mouth at bedtime. 12/31/17  Yes Patwardhan, Manish J, MD  torsemide (DEMADEX) 20 MG tablet TAKE 1 TABLET DAILY AS NEEDED FOR LEG EDEMA 03/16/18  Yes [provider]  Vitamin D, Ergocalciferol, (DRISDOL) 50000 units CAPS capsule Take 50,000 Units by mouth every Thursday.    Yes [provider]  cephALEXin (KEFLEX) 500 MG capsule Take 1 capsule (500 mg total) by mouth 3 (three) times daily. Patient not taking: Reported on 06/14/2018 06/09/18   Wallene Huh, DPM    Family History Family History  Problem Relation Age of Onset  . Cancer Mother        breast  . Heart Problems Mother   . Prostate cancer Father   . Muscular dystrophy Son   . Cancer Maternal Grandmother        breast  . Heart disease Maternal Grandfather   . Breast cancer Sister     Social History Social History   Tobacco Use  . Smoking status: Never Smoker  . Smokeless tobacco: Never Used  Substance Use Topics  . Alcohol use: No  . Drug use: No     Allergies   Rosiglitazone maleate; Morphine; Cephalexin; Sulfa antibiotics; Elavil [amitriptyline]; and Tramadol-acetaminophen   Review of Systems Review of Systems  Constitutional: Positive for chills and fever.  HENT: Negative for rhinorrhea and sore throat.   Eyes: Negative for redness.  Respiratory: Negative for cough.   Cardiovascular: Negative for chest pain.  Gastrointestinal: Negative for abdominal pain, diarrhea, nausea and vomiting.  Genitourinary: Negative for dysuria.  Musculoskeletal: Negative for myalgias.  Skin: Positive for color change. Negative for rash.  Neurological: Negative for headaches.     Physical Exam Updated Vital  Signs BP (!) 189/50 (BP Location: Left Arm)   Pulse 78   Temp 100.3 F (37.9 C) (Oral)   Resp 15   Wt 88.5 kg (195 lb)   SpO2 95%   BMI 32.45 kg/m   Physical Exam  Constitutional: She appears well-developed and well-nourished.  HENT:  Head: Normocephalic and atraumatic.  Eyes: Conjunctivae are normal. Right eye exhibits no discharge. Left eye  exhibits no discharge.  Neck: Normal range of motion. Neck supple.  Cardiovascular: Normal rate, regular rhythm and normal heart sounds.  Pulmonary/Chest: Effort normal and breath sounds normal.  Abdominal: Soft. There is no tenderness.  Musculoskeletal:       Right knee: Normal.       Right ankle: Normal.       Right lower leg: Normal.       Right foot: There is decreased range of motion, tenderness and swelling. There is no bony tenderness.       Feet:  Patient with ulceration at the base of the right little toe on the plantar aspect of the foot.  Patient has associated erythema and tenderness over the dorsal aspect of the foot laterally.  Area is very warm.  Neurological: She is alert.  Skin: Skin is warm and dry.  Psychiatric: She has a normal mood and affect.  Nursing note and vitals reviewed.    ED Treatments / Results  Labs (all labs ordered are listed, but only abnormal results are displayed) Labs Reviewed  CBC WITH DIFFERENTIAL/PLATELET - Abnormal; Notable for the following components:      Result Value   RBC 3.85 (*)    Hemoglobin 11.4 (*)    HCT 34.1 (*)    Platelets 128 (*)    All other components within normal limits  BASIC METABOLIC PANEL - Abnormal; Notable for the following components:   Glucose, Bld 160 (*)    BUN 26 (*)    Creatinine, Ser 1.03 (*)    GFR calc non Af Amer 51 (*)    GFR calc Af Amer 59 (*)    All other components within normal limits  CULTURE, BLOOD (ROUTINE X 2)  CULTURE, BLOOD (ROUTINE X 2)  URINALYSIS, ROUTINE W REFLEX MICROSCOPIC  I-STAT CG4 LACTIC ACID, ED     EKG None  Radiology Dg Chest 2 View  Result Date: 06/14/2018 CLINICAL DATA:  Right-sided dorsal foot pain. Redness and swelling. No trauma. EXAM: CHEST - 2 VIEW COMPARISON:  April 25, 2018 FINDINGS: Mild cardiomegaly. The hila and mediastinum are normal. No pulmonary nodules, masses, or focal infiltrates. No overt edema. No suspicious infiltrate. IMPRESSION: No active cardiopulmonary disease. Electronically Signed   By: Dorise Bullion III M.D   On: 06/14/2018 12:59   Dg Foot Complete Right  Result Date: 06/14/2018 CLINICAL DATA:  Dorsal right foot pain redness, and swelling for the past 2 weeks. No injury. EXAM: RIGHT FOOT COMPLETE - 3+ VIEW COMPARISON:  Right foot series of June 09, 2018 FINDINGS: The bones are subjectively adequately mineralized. There is swelling of the forefoot. The patient has undergone previous bunionectomy with a pin placed at the junction of the shaft and head of the first metatarsal. A cortical screw is present involving the head of the second metatarsal. The phalanges are intact as are the third through fifth metatarsals. The tarsal bones exhibit no acute abnormalities. There are plantar and Achilles region calcaneal spurs. IMPRESSION: There are no acute bony abnormalities. There is soft tissue swelling over the forefoot. Electronically Signed   By: David  Martinique M.D.   On: 06/14/2018 13:02    Procedures Procedures (including critical care time)  Medications Ordered in ED Medications  vancomycin (VANCOCIN) 1,500 mg in sodium chloride 0.9 % 500 mL IVPB (has no administration in time range)  piperacillin-tazobactam (ZOSYN) IVPB 3.375 g (0 g Intravenous Stopped 06/14/18 1435)  sodium chloride 0.9 % bolus 500 mL (0 mLs Intravenous Stopped 06/14/18 1300)  Initial Impression / Assessment and Plan / ED Course  I have reviewed the triage vital signs and the nursing notes.  Pertinent labs & imaging results that were available during my care of the patient were  reviewed by me and considered in my medical decision making (see chart for details).     Patient seen and examined. Work-up initiated. Medications ordered.   Vital signs reviewed and are as follows: BP (!) 189/50 (BP Location: Left Arm)   Pulse 78   Temp 100.3 F (37.9 C) (Oral)   Resp 15   Wt 88.5 kg (195 lb)   SpO2 95%   BMI 32.45 kg/m   Spoke with Dr. Wynelle Cleveland who will see patient. Patient updated and aware of need for admission. She is in agreement. Will speak with podiatry regarding plan.   2:43 PM I spoke with [facility]. Podiatry, Dr. Paulino Door, will see patient and consult tonight or tomorrow. They will make determination as to whether patient needs surgical intervention.   Final Clinical Impressions(s) / ED Diagnoses   Final diagnoses:  Diabetic foot infection ([place])   Admit for above.   ED Discharge Orders    None       Carlisle Cater, Hershal Coria 06/14/18 1445    Long, Wonda Olds, MD 06/14/18 [phone]

## 2018-06-14 NOTE — H&P (Addendum)
History and Physical    Sara Graves  HBZ:[id number]  DOB: [birth year]  DOA: 06/14/2018 PCP: [name], PA-C   Patient coming from: home  Chief Complaint: swelling and pain of right foot, fever  HPI: Sara Graves is a 77 y.o. female with medical history of DM, CAD s/p stenting recently, CHF, HTN, spinal stimulator, anxiety who lives alone. She had a callous on her right foot and this was debrided by per podiatrist a few times. She has been taking Vantin as prescribed by him. About a few days ago, she noticed swelling and redness and pain in the right foot. She began having fevers yesterday. She does not have neuropathy in the foot.   ED Course:  Fever 101.9, BUN 26/ Cr 1.03, Platelets 128 Right foot xray > There are no acute bony abnormalities. There is soft tissue swelling over the forefoot.  Review of Systems:  All other systems reviewed and apart from HPI, are negative.  Past Medical History:  Diagnosis Date  . Anxiety   . Arthritis    "hands" (12/29/2017)  . Basal cell carcinoma of nose    removed  . Bursitis of left shoulder   . Cat scratch fever    Late 90s  . Coronary artery disease   . Depression   . Diabetes mellitus, type II, insulin dependent ([place])   . GERD (gastroesophageal reflux disease)   . H. pylori infection 2008 and 1998   treated  . Hyperlipidemia   . Hypertension   . Hypothyroidism   . Multinodular goiter   . Rotator cuff tear, left recurrent   . Urge urinary incontinence   . UTI (lower urinary tract infection) 05/2016    Past Surgical History:  Procedure Laterality Date  . APPENDECTOMY  AGE 60  . BACK SURGERY    . BASAL CELL CARCINOMA EXCISION     "nose"  . BLADDER NECK SUSPENSION  1970's  . BUNIONECTOMY WITH HAMMERTOE RECONSTRUCTION Bilateral   . CARPAL TUNNEL RELEASE Right 05/2017  . COLONOSCOPY W/ POLYPECTOMY    . CORONARY ANGIOPLASTY WITH STENT PLACEMENT  12/29/2017  . CORONARY STENT INTERVENTION N/A 12/29/2017   Procedure:  CORONARY STENT INTERVENTION;  Surgeon: Nigel Mormon, MD;  Location: [facility];  Service: Cardiovascular;  Laterality: N/A;  . CORONARY STENT INTERVENTION N/A 04/26/2018   Procedure: CORONARY STENT INTERVENTION;  Surgeon: Nigel Mormon, MD;  Location: [facility];  Service: Cardiovascular;  Laterality: N/A;  . ESOPHAGOGASTRODUODENOSCOPY ENDOSCOPY    . FORAMINAL DECOMPRESSION AT L2 TO THE SACRUM  01-05-2008   L2  -  S1  . GANGLION CYST EXCISION Left 01/17/2009   ring finger  . IMPLANTATION PERMANENT SPINAL CORD STIMULATOR  06-15-2008   JUNE 2013--  BATTERY CHANGE  . JOINT REPLACEMENT    . LEFT HEART CATH AND CORONARY ANGIOGRAPHY N/A 04/26/2018   Procedure: LEFT HEART CATH AND CORONARY ANGIOGRAPHY;  Surgeon: Nigel Mormon, MD;  Location: [facility];  Service: Cardiovascular;  Laterality: N/A;  . LIPOMA EXCISION Right    RIGHT ELBOW  . RIGHT/LEFT HEART CATH AND CORONARY ANGIOGRAPHY N/A 12/29/2017   Procedure: RIGHT/LEFT HEART CATH AND CORONARY ANGIOGRAPHY;  Surgeon: Nigel Mormon, MD;  Location: [facility];  Service: Cardiovascular;  Laterality: N/A;  . SHOULDER OPEN ROTATOR CUFF REPAIR  09/07/2012   Procedure: ROTATOR CUFF REPAIR SHOULDER OPEN;  Surgeon: Magnus Sinning, MD;  Location: [facility];  Service: Orthopedics;  Laterality:  Left;  OPEN ANTERIOR ACROMIONECTOMY AND ROTATOR CUFF REPAIR ON LEFT   . SHOULDER OPEN ROTATOR CUFF REPAIR Left 12/28/2012   Procedure: ROTATOR CUFF REPAIR SHOULDER OPEN;  Surgeon: Magnus Sinning, MD;  Location: [facility];  Service: Orthopedics;  Laterality: Left;  OPEN SHOULDER ROTATOR CUFF REPAIR ON LEFT  WITH ANTERIOR ACROMINECTOMY   . SHOULDER OPEN ROTATOR CUFF REPAIR Right 03/28/2003   RIGHT SHOULDER  DEGENERATIVE AC JOINT AND RC TEAR  . SPINAL CORD STIMULATOR BATTERY EXCHANGE N/A 07/03/2016   Procedure: SPINAL CORD STIMULATOR BATTERY PLACMENT;  Surgeon: Melina Schools,  MD;  Location: [facility];  Service: Orthopedics;  Laterality: N/A;  . TONSILLECTOMY  AGE 43  . 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 VAGINAL HYSTERECTOMY  1970's    Social History:   reports that she has never smoked. She has never used smokeless tobacco. She reports that she does not drink alcohol or use drugs.  Allergies  Allergen Reactions  . Rosiglitazone Maleate Anaphylaxis and Swelling  . Morphine Other (See Comments)    SEVERE HYPOTENSION   . Cephalexin Diarrhea  . Sulfa Antibiotics Diarrhea and Nausea And Vomiting  . Elavil [Amitriptyline] Nausea Only  . Tramadol-Acetaminophen Rash    Family History  Problem Relation Age of Onset  . Cancer Mother        breast  . Heart Problems Mother   . Prostate cancer Father   . Muscular dystrophy Son   . Cancer Maternal Grandmother        breast  . Heart disease Maternal Grandfather   . Breast cancer Sister      Prior to Admission medications   Medication Sig Start Date End Date Taking? Authorizing Provider  amLODipine (NORVASC) 5 MG tablet Take 1 tablet (5 mg total) by mouth daily. 12/31/17  Yes Patwardhan, Manish J, MD  aspirin ([facility] ASPIRIN) 81 MG chewable tablet Chew 81 mg by mouth daily.    Yes [provider]  busPIRone (BUSPAR) 7.5 MG tablet Take 7.5 mg by mouth 2 (two) times daily.   Yes [provider]  Calcium Carb-Cholecalciferol (CALCIUM 600 + D PO) Take 1 tablet by mouth daily.   Yes [provider]  cefpodoxime (VANTIN) 200 MG tablet TAKE ONE TABLET (200 MG DOSE) BY MOUTH 2 (TWO) TIMES DAILY. 01/23/18  Yes [provider]  dapagliflozin propanediol (FARXIGA) 10 MG TABS tablet Take 10 mg by mouth daily.    Yes [provider]  ezetimibe (ZETIA) 10 MG tablet Take 10 mg by mouth daily.   Yes [provider]  HUMALOG 100 UNIT/ML injection Inject 56 Units into the skin See admin instructions. For [place] e with V - GO 20 Insulin Delivery  Device. Total : 56 Units / Day. 04/06/18  Yes [provider]  Investigational - Study Medication Take 1 tablet by mouth daily. Study name: dapagliflozin/metformin ER 10/1000mg  Additional study details: This is a Drug Study medication from Dr. Nadyne Coombes at [facility]. Patient started taking this medication on 04/22/18 and is unsure of how long she is to take this medication for. Per patient, she is on this medication as part of a Heart and Diabetes drug study. 04/28/18  Yes Patwardhan, Manish J, MD  isosorbide mononitrate (IMDUR) 60 MG 24 hr tablet Take 60 mg by mouth daily.   Yes [provider]  labetalol (NORMODYNE) 300 MG tablet Take 1 tablet (300 mg total) by mouth 2 (two) times daily. 04/27/18  Yes Patwardhan, Manish J, MD  levothyroxine (SYNTHROID, LEVOTHROID) 50 MCG tablet Take 50 mcg by mouth every morning.  08/24/12  Yes Clovis Cao, MD  nitroGLYCERIN (NITROSTAT) 0.4 MG SL tablet Place 1 tablet (0.4 mg total) under the tongue every 5 (five) minutes x 3 doses as needed for chest pain. 04/27/18  Yes Patwardhan, Reynold Bowen, MD  Omega-3 Fatty Acids (FISH OIL) 1000 MG CAPS Take 1,000 mg by mouth 2 (two) times daily. OTC fish oil   Yes [provider]  pantoprazole (PROTONIX) 40 MG tablet Take 1 tablet (40 mg total) by mouth 2 (two) times daily. 12/31/17  Yes Patwardhan, Manish J, MD  potassium chloride SA (KLOR-CON M20) 20 MEQ tablet Take 1 tablet (20 mEq total) by mouth 2 (two) times daily. With Torsemide as needed for leg swelling 04/27/18  Yes Patwardhan, Manish J, MD  prasugrel (EFFIENT) 10 MG TABS tablet Take 1 tablet (10 mg total) by mouth daily. 04/28/18  Yes Patwardhan, Manish J, MD  rosuvastatin (CRESTOR) 40 MG tablet Take 1 tablet (40 mg total) by mouth at bedtime. 12/31/17  Yes Patwardhan, Manish J, MD  torsemide (DEMADEX) 20 MG tablet TAKE 1 TABLET DAILY AS NEEDED FOR LEG EDEMA 03/16/18  Yes [provider]  Vitamin D, Ergocalciferol, (DRISDOL) 50000  units CAPS capsule Take 50,000 Units by mouth every Thursday.    Yes [provider]    Physical Exam: Wt Readings from Last 3 Encounters:  06/14/18 88.5 kg (195 lb)  04/27/18 82 kg (180 lb 12.4 oz)  12/31/17 87 kg (191 lb 12.8 oz)   Vitals:   06/14/18 1033 06/14/18 1040 06/14/18 1543  BP: (!) 189/50  (!) 154/51  Pulse: 78  70  Resp: 15  16  Temp: 100.3 F (37.9 C)  98.9 F (37.2 C)  TempSrc: Oral  Oral  SpO2: 95%  97%  Weight:  88.5 kg (195 lb)       Constitutional:  Calm & comfortable Eyes: PERRLA, lids and conjunctivae normal ENT:  Mucous membranes are moist.  Pharynx clear of exudate   Normal dentition.  Neck: Supple, no masses  Respiratory:  Clear to auscultation bilaterally  Normal respiratory effort.  Cardiovascular:  S1 & S2 heard, regular rate and rhythm No Murmurs Abdomen:  Non distended No tenderness, No masses Bowel sounds normal Extremities:  No clubbing / cyanosis No pedal edema No joint deformity    Skin:          Neurologic:  AAO x 3 CN 2-12 grossly intact Sensation intact Strength 5/5 in all 4 extremities Psychiatric:  Normal Mood and affect    Labs on Admission: I have personally reviewed following labs and imaging studies  CBC: Recent Labs  Lab 06/14/18 1155  WBC 7.3  NEUTROABS 5.7  HGB 11.4*  HCT 34.1*  MCV 88.6  PLT 315*   Basic Metabolic Panel: Recent Labs  Lab 06/14/18 1155  NA 138  K 3.8  CL 105  CO2 22  GLUCOSE 160*  BUN 26*  CREATININE 1.03*  CALCIUM 9.0   GFR: Estimated Creatinine Clearance: 50.3 mL/min (A) (by C-G formula based on SCr of 1.03 mg/dL (H)). Liver Function Tests: No results for input(s): AST, ALT, ALKPHOS, BILITOT, PROT, ALBUMIN in the last 168 hours. No results for input(s): LIPASE, AMYLASE in the last 168 hours. No results for input(s): AMMONIA in the last 168 hours. Coagulation Profile: No results for input(s): INR, PROTIME in the last 168 hours. Cardiac Enzymes: No  results for input(s): CKTOTAL, CKMB, CKMBINDEX, TROPONINI in the last 168 hours. BNP (last 3 results) No results for input(s): PROBNP in the last 8760 hours. HbA1C: No results for input(s): HGBA1C in the last 72 hours. CBG: No results for input(s): GLUCAP in the last 168 hours. Lipid Profile: No results for input(s): CHOL, HDL, LDLCALC, TRIG, CHOLHDL, LDLDIRECT in the last 72 hours. Thyroid Function Tests: No results for input(s): TSH, T4TOTAL, FREET4, T3FREE, THYROIDAB in the last 72 hours. Anemia Panel: No results for input(s): VITAMINB12, FOLATE, FERRITIN, TIBC, IRON, RETICCTPCT in the last 72 hours. Urine analysis:    Component Value Date/Time   COLORURINE STRAW (A) 04/25/2018 1246   APPEARANCEUR CLEAR 04/25/2018 1246   LABSPEC 1.018 04/25/2018 1246   PHURINE 5.0 04/25/2018 1246   GLUCOSEU >=500 (A) 04/25/2018 1246   HGBUR SMALL (A) 04/25/2018 1246   HGBUR negative 12/03/2010 1405   BILIRUBINUR NEGATIVE 04/25/2018 1246   BILIRUBINUR NEG 06/18/2012 0937   KETONESUR NEGATIVE 04/25/2018 1246   PROTEINUR 100 (A) 04/25/2018 1246   UROBILINOGEN 1.0 05/03/2015 1055   NITRITE NEGATIVE 04/25/2018 1246   LEUKOCYTESUR NEGATIVE 04/25/2018 1246   Sepsis Labs: @LABRCNTIP (procalcitonin:4,lacticidven:4) )No results found for this or any previous visit (from the past 240 hour(s)).   Radiological Exams on Admission: Dg Chest 2 View  Result Date: 06/14/2018 CLINICAL DATA:  Right-sided dorsal foot pain. Redness and swelling. No trauma. EXAM: CHEST - 2 VIEW COMPARISON:  April 25, 2018 FINDINGS: Mild cardiomegaly. The hila and mediastinum are normal. No pulmonary nodules, masses, or focal infiltrates. No overt edema. No suspicious infiltrate. IMPRESSION: No active cardiopulmonary disease. Electronically Signed   By: Dorise Bullion III M.D   On: 06/14/2018 12:59   Dg Foot Complete Right  Result Date: 06/14/2018 CLINICAL DATA:  Dorsal right foot pain redness, and swelling for the past 2 weeks.  No injury. EXAM: RIGHT FOOT COMPLETE - 3+ VIEW COMPARISON:  Right foot series of June 09, 2018 FINDINGS: The bones are subjectively adequately mineralized. There is swelling of the forefoot. The patient has undergone previous bunionectomy with a pin placed at the junction of the shaft and head of the first metatarsal. A cortical screw is present involving the head of the second metatarsal. The phalanges are intact as are the third through fifth metatarsals. The tarsal bones exhibit no acute abnormalities. There are plantar and Achilles region calcaneal spurs. IMPRESSION: There are no acute bony abnormalities. There is soft tissue swelling over the forefoot. Electronically Signed   By: David  Martinique M.D.   On: 06/14/2018 13:02    Assessment/Plan Principal Problem:   Wound infection - Zosyn, Vanc- Xray unrevealing - obtain CT of the foot to look for abscess - follow blood cultures  Active Problems:  CKD 3 - follow    Hypothyroidism - Lovenox    DM (diabetes mellitus), type 2, uncontrolled  - SSI ordered- she takes Novolog with meals - hold Farxiga    Essential hypertension  - cont Norvasc, Imdur, labetalol  CAD - recent stent- cont Effient, Aspirin and above cardiac meds  Anxiety - Buspar  Chronic mild thrombocytopenia  Spinal stimulator   DVT prophylaxis: Lovenox Code Status: Full code  Family Communication:   Disposition Plan: med/surg  Consults called: ortho called by ED  Admission status: inpatient    Debbe Odea MD [facility] Pager: [email or web address] Password [email or web address] [phone], please contact night-coverage   06/14/2018, 4:29 PM

## 2018-06-14 NOTE — Progress Notes (Signed)
Subjective:   [name], female   DOB: 77 y.o.   MRN: [id number]   HPI Patient states she started to run a fever yesterday and she has chills and her foot does not seem to be responding well to the antibiotic and the redness is still there and it is sore   ROS      Objective:  Physical Exam  Neurovascular status was found to be unchanged from previous but patient has increased redness and warmth in the right lateral foot and into the center of the foot.  There continues to be breakdown of tissue plantar aspect right fifth metatarsal which measures approximately 7 x 7 mm and patient does have mild warmth extending into the ankle and lower leg with no indications of increased redness or any streaking at this time.  She does seem to have chills and fever and she is communicative but seems to be in a somewhat altered state     Assessment:  Concerned that she is developing cellulitis and does not appear to be responding to oral antibiotics     Plan:  Had Dr. Carman Ching also see her today and were both in agreement that she needs to go to the hospital for IV antibiotics and that she will probably require at least fifth metatarsal head resection and it is possible that she has osteomyelitis bone infection.  At this point she is sent straight to the hospital with all instructions and we will coordinate with them as well I will be out of town we will have the physician to see her and hopefully be able to correct the underlying problem.  Patient is instructed to call me with any questions and I gave her my card

## 2018-06-14 NOTE — ED Notes (Addendum)
ED TO INPATIENT HANDOFF REPORT  Name/Age/Gender Sara Graves 77 y.o. female  Code Status Code Status History    Date Active Date Inactive Code Status Order ID Comments User Context   04/25/2018 1651 04/27/2018 1707 Full Code [id number]  Adrian Prows, MD ED   12/29/2017 1712 12/31/2017 1609 Full Code [id number]  Nigel Mormon, MD Inpatient   03/24/2015 2121 03/26/2015 1853 Full Code [id number]  Ivor Costa, MD Inpatient   05/16/2014 0031 05/17/2014 1718 Partial Code [id number]  Cordelia Poche, MD Inpatient      Home/SNF/Other Home  Chief Complaint back and neck pain   Level of Care/Admitting Diagnosis ED Disposition    ED Disposition Condition [place] [facility] [facility] Area: [facility] [[id number]]  Level of Care: Med-Surg [16]  Diagnosis: Wound infection [993716]  Admitting Physician: [place], Effie  Attending Physician: Debbe Odea [[id number]]  Estimated length of stay: past midnight tomorrow  Certification:: I certify this patient will need inpatient services for at least 2 midnights  PT Class (Do Not Modify): Inpatient [101]  PT Acc Code (Do Not Modify): Private [1]       Medical History Past Medical History:  Diagnosis Date  . Anxiety   . Arthritis    "hands" (12/29/2017)  . Basal cell carcinoma of nose    removed  . Bursitis of left shoulder   . Cat scratch fever    Late 90s  . Coronary artery disease   . Depression   . Diabetes mellitus, type II, insulin dependent ([place])   . GERD (gastroesophageal reflux disease)   . H. pylori infection 2008 and 1998   treated  . Hyperlipidemia   . Hypertension   . Hypothyroidism   . Multinodular goiter   . Rotator cuff tear, left recurrent   . Urge urinary incontinence   . UTI (lower urinary tract infection) 05/2016    Allergies Allergies  Allergen Reactions  . Rosiglitazone Maleate Anaphylaxis and Swelling  . Morphine Other (See Comments)    SEVERE HYPOTENSION   . Cephalexin Diarrhea  . Sulfa  Antibiotics Diarrhea and Nausea And Vomiting  . Elavil [Amitriptyline] Nausea Only  . Tramadol-Acetaminophen Rash    IV Location/Drains/Wounds Patient Lines/Drains/Airways Status   Active Line/Drains/Airways    Name:   Placement date:   Placement time:   Site:   Days:   Peripheral IV 06/14/18 Right Forearm   06/14/18    1150    Forearm   less than 1          Labs/Imaging Results for orders placed or performed during the hospital encounter of 06/14/18 (from the past 48 hour(s))  CBC with Differential/Platelet     Status: Abnormal   Collection Time: 06/14/18 11:55 AM  Result Value Ref Range   WBC 7.3 4.0 - 10.5 K/uL   RBC 3.85 (L) 3.87 - 5.11 MIL/uL   Hemoglobin 11.4 (L) 12.0 - 15.0 g/dL   HCT 34.1 (L) 36.0 - 46.0 %   MCV 88.6 78.0 - 100.0 fL   MCH 29.6 26.0 - 34.0 pg   MCHC 33.4 30.0 - 36.0 g/dL   RDW 14.5 11.5 - 15.5 %   Platelets 128 (L) 150 - 400 K/uL   Neutrophils Relative % 78 %   Neutro Abs 5.7 1.7 - 7.7 K/uL   Lymphocytes Relative 12 %   Lymphs Abs 0.9 0.7 - 4.0 K/uL   Monocytes Relative 10 %   Monocytes Absolute 0.8 0.1 - 1.0 K/uL  Eosinophils Relative 0 %   Eosinophils Absolute 0.0 0.0 - 0.7 K/uL   Basophils Relative 0 %   Basophils Absolute 0.0 0.0 - 0.1 K/uL    Comment: Performed at [facility], [place] [address], [place], [place] [address]  Basic metabolic panel     Status: Abnormal   Collection Time: 06/14/18 11:55 AM  Result Value Ref Range   Sodium 138 135 - 145 mmol/L   Potassium 3.8 3.5 - 5.1 mmol/L   Chloride 105 98 - 111 mmol/L   CO2 22 22 - 32 mmol/L   Glucose, Bld 160 (H) 70 - 99 mg/dL   BUN 26 (H) 8 - 23 mg/dL   Creatinine, Ser 1.03 (H) 0.44 - 1.00 mg/dL   Calcium 9.0 8.9 - 10.3 mg/dL   GFR calc non Af Amer 51 (L) >60 mL/min   GFR calc Af Amer 59 (L) >60 mL/min    Comment: (NOTE) The eGFR has been calculated using the CKD EPI equation. This calculation has not been validated in all clinical situations. eGFR's persistently  <60 mL/min signify possible Chronic Kidney Disease.    Anion gap 11 5 - 15    Comment: Performed at [facility], [place] [address], [place], [place] [address]  I-Stat CG4 Lactic Acid, ED     Status: None   Collection Time: 06/14/18 12:04 PM  Result Value Ref Range   Lactic Acid, Venous 0.93 0.5 - 1.9 mmol/L   Dg Chest 2 View  Result Date: 06/14/2018 CLINICAL DATA:  Right-sided dorsal foot pain. Redness and swelling. No trauma. EXAM: CHEST - 2 VIEW COMPARISON:  April 25, 2018 FINDINGS: Mild cardiomegaly. The hila and mediastinum are normal. No pulmonary nodules, masses, or focal infiltrates. No overt edema. No suspicious infiltrate. IMPRESSION: No active cardiopulmonary disease. Electronically Signed   By: Dorise Bullion III M.D   On: 06/14/2018 12:59   Dg Foot Complete Right  Result Date: 06/14/2018 CLINICAL DATA:  Dorsal right foot pain redness, and swelling for the past 2 weeks. No injury. EXAM: RIGHT FOOT COMPLETE - 3+ VIEW COMPARISON:  Right foot series of June 09, 2018 FINDINGS: The bones are subjectively adequately mineralized. There is swelling of the forefoot. The patient has undergone previous bunionectomy with a pin placed at the junction of the shaft and head of the first metatarsal. A cortical screw is present involving the head of the second metatarsal. The phalanges are intact as are the third through fifth metatarsals. The tarsal bones exhibit no acute abnormalities. There are plantar and Achilles region calcaneal spurs. IMPRESSION: There are no acute bony abnormalities. There is soft tissue swelling over the forefoot. Electronically Signed   By: David  Martinique M.D.   On: 06/14/2018 13:02    Pending Labs Unresulted Labs (From admission, onward)   Start     Ordered   06/14/18 1119  Urinalysis, Routine w reflex microscopic  Once,   R     06/14/18 1118   06/14/18 1106  Culture, blood (Routine X 2) w Reflex to ID Panel  BLOOD CULTURE X 2,   STAT     06/14/18 1105       Vitals/Pain Today's Vitals   06/14/18 1033 06/14/18 1040  BP: (!) 189/50   Pulse: 78   Resp: 15   Temp: 100.3 F (37.9 C)   TempSrc: Oral   SpO2: 95%   Weight:  195 lb (88.5 kg)  PainSc:  8     Isolation Precautions No  active isolations  Medications Medications  vancomycin (VANCOCIN) 1,500 mg in sodium chloride 0.9 % 500 mL IVPB (has no administration in time range)  piperacillin-tazobactam (ZOSYN) IVPB 3.375 g (0 g Intravenous Stopped 06/14/18 1435)  sodium chloride 0.9 % bolus 500 mL (0 mLs Intravenous Stopped 06/14/18 1300)    Mobility walks with device

## 2018-06-14 NOTE — Progress Notes (Signed)
Pharmacy Antibiotic Note  Sara Graves is a 77 y.o. female admitted on 06/14/2018 from podiatrist office for fever, worsening erythema, warmth, and tenderness of diabetic right foot ulcer despite outpatient antibiotics.  Pharmacy has been consulted for Vancomycin, Zosyn dosing. -  CT to assess for abscess, osteomyelitis - SCr 1.03, Tm 101.9, WBC 7.3  Plan:  Zosyn 3.375g IV Q8H infused over 4hrs.  Vancomycin 1500 mg IV x1 followed by 1250 mg IV q36h.  Check vancomycin levels if remains on vancomycin > 3-4 days.  Goal AUC 400-500.  Follow up renal fxn, culture results, and clinical course.  Daily SCr  F/u ability to de-escalate antibiotics.   Weight: 195 lb (88.5 kg)  Temp (24hrs), Avg:100.4 F (38 C), Min:98.9 F (37.2 C), Max:101.9 F (38.8 C)  Recent Labs  Lab 06/14/18 1155 06/14/18 1204  WBC 7.3  --   CREATININE 1.03*  --   LATICACIDVEN  --  0.93    Estimated Creatinine Clearance: 50.3 mL/min (A) (by C-G formula based on SCr of 1.03 mg/dL (H)).    Allergies  Allergen Reactions  . Rosiglitazone Maleate Anaphylaxis and Swelling  . Morphine Other (See Comments)    SEVERE HYPOTENSION   . Cephalexin Diarrhea  . Sulfa Antibiotics Diarrhea and Nausea And Vomiting  . Elavil [Amitriptyline] Nausea Only  . Tramadol-Acetaminophen Rash    Antimicrobials this admission: 7/29 Zosyn >> 7/29 Vancomycin >>   Dose adjustments this admission:   Microbiology results: 7/29 BCx:   Thank you for allowing pharmacy to be a part of this patient's care.  Gretta Arab PharmD, BCPS Pager [phone] 06/14/2018 7:07 PM

## 2018-06-14 NOTE — ED Notes (Signed)
Coming from [facility]-right leg cellulitis-needs IV ABX and possible MRI

## 2018-06-15 ENCOUNTER — Inpatient Hospital Stay (HOSPITAL_COMMUNITY): Payer: Medicare Other

## 2018-06-15 DIAGNOSIS — L03039 Cellulitis of unspecified toe: Secondary | ICD-10-CM

## 2018-06-15 DIAGNOSIS — L03115 Cellulitis of right lower limb: Secondary | ICD-10-CM | POA: Diagnosis present

## 2018-06-15 DIAGNOSIS — L03032 Cellulitis of left toe: Secondary | ICD-10-CM | POA: Diagnosis present

## 2018-06-15 DIAGNOSIS — E11628 Type 2 diabetes mellitus with other skin complications: Principal | ICD-10-CM

## 2018-06-15 LAB — CBC
HEMATOCRIT: 30.1 % — AB (ref 36.0–46.0)
Hemoglobin: 10 g/dL — ABNORMAL LOW (ref 12.0–15.0)
MCH: 29.9 pg (ref 26.0–34.0)
MCHC: 33.2 g/dL (ref 30.0–36.0)
MCV: 89.9 fL (ref 78.0–100.0)
PLATELETS: 127 10*3/uL — AB (ref 150–400)
RBC: 3.35 MIL/uL — ABNORMAL LOW (ref 3.87–5.11)
RDW: 14.6 % (ref 11.5–15.5)
WBC: 7 10*3/uL (ref 4.0–10.5)

## 2018-06-15 LAB — URINALYSIS, ROUTINE W REFLEX MICROSCOPIC
BACTERIA UA: NONE SEEN
Bilirubin Urine: NEGATIVE
HGB URINE DIPSTICK: NEGATIVE
KETONES UR: 5 mg/dL — AB
Leukocytes, UA: NEGATIVE
NITRITE: NEGATIVE
Protein, ur: 30 mg/dL — AB
Specific Gravity, Urine: 1.036 — ABNORMAL HIGH (ref 1.005–1.030)
pH: 5 (ref 5.0–8.0)

## 2018-06-15 LAB — HEMOGLOBIN A1C
Hgb A1c MFr Bld: 8.9 % — ABNORMAL HIGH (ref 4.8–5.6)
MEAN PLASMA GLUCOSE: 208.73 mg/dL

## 2018-06-15 LAB — SEDIMENTATION RATE: Sed Rate: 65 mm/hr — ABNORMAL HIGH (ref 0–22)

## 2018-06-15 LAB — BASIC METABOLIC PANEL
Anion gap: 6 (ref 5–15)
BUN: 25 mg/dL — AB (ref 8–23)
CHLORIDE: 109 mmol/L (ref 98–111)
CO2: 23 mmol/L (ref 22–32)
CREATININE: 1.01 mg/dL — AB (ref 0.44–1.00)
Calcium: 8.5 mg/dL — ABNORMAL LOW (ref 8.9–10.3)
GFR calc Af Amer: >60 mL/min (ref >60)
GFR calc non Af Amer: 52 mL/min — ABNORMAL LOW (ref >60)
GLUCOSE: 129 mg/dL — AB (ref 70–99)
Potassium: 3.6 mmol/L (ref 3.5–5.1)
Sodium: 138 mmol/L (ref 135–145)

## 2018-06-15 LAB — GLUCOSE, CAPILLARY
GLUCOSE-CAPILLARY: 233 mg/dL — AB (ref 70–99)
GLUCOSE-CAPILLARY: 225 mg/dL — AB (ref 70–99)
Glucose-Capillary: 118 mg/dL — ABNORMAL HIGH (ref 70–99)
Glucose-Capillary: 159 mg/dL — ABNORMAL HIGH (ref 70–99)

## 2018-06-15 LAB — C-REACTIVE PROTEIN: CRP: 18.8 mg/dL — ABNORMAL HIGH (ref <1.0)

## 2018-06-15 MED ORDER — CHLORHEXIDINE GLUCONATE CLOTH 2 % EX PADS
6.0000 | MEDICATED_PAD | Freq: Once | CUTANEOUS | Status: AC
  Administered 2018-06-15: 6 via TOPICAL

## 2018-06-15 MED ORDER — ENOXAPARIN SODIUM 40 MG/0.4ML ~~LOC~~ SOLN
40.0000 mg | SUBCUTANEOUS | Status: AC
  Administered 2018-06-15: 40 mg via SUBCUTANEOUS
  Filled 2018-06-15: qty 0.4

## 2018-06-15 MED ORDER — SODIUM CHLORIDE 0.9 % IV SOLN
INTRAVENOUS | Status: DC | PRN
  Administered 2018-06-15: 500 mL via INTRAVENOUS

## 2018-06-15 NOTE — Progress Notes (Signed)
Subjective: Sara Graves is a 77 y.o. year old female with medical history significant for CAD status post NSTEMI (DES on 04/2017, now on aspirin and Prasugrel), CHF, HTN, type 2 diabetes, Spinal stimulator who presented on 06/14/2018 as directed by her podiatrist who was concern for developing right foot cellulitis not responding to oral antibiotics.  Her podiatrist [name] saw her on 06/14/2018 for assessment of chronic right foot ulcer.  She has had subjective fevers and chills with worsening pain and swelling since this weekend.  She was previously on Keflex.  Since admission she has been afebrile and her pain is improving. Overall, she is feeling better. Has some occasional pain but improved.    Objective: AAO x3, NAD DP/PT pulses palpable bilaterally and can palpate them better today as swelling is improved, CRT less than 3 seconds There is decreased but continued edema and erythema mostly to the lateral aspect of the foot.  There is no areas of fluctuation or crepitation.  Ulceration does continue submetatarsal 5 which measures approximately 0.5 x 0.5 cm however does probe approximate 1 cm I can protrude beyond the fifth metatarsal head.  Small amount of purulence was identified.  No ascending cellulitis identified. Edema erythema decreased the left second toe No pain with calf compression, swelling, warmth, erythema  Assessment: Chronic ulceration right foot with improving cellulitis  Plan: -All treatment options discussed with the patient including all alternatives, risks, complications.  -Would recommend noninvasive arterial studies given prolonged nature of the wound although the pulses are palpable.  -As plan we discussed with conservative as well as surgical options.  Given that the wound can probe to bone although CT scan was negative for osteomyelitis or abscess I do think that the fifth metatarsal head would be beneficial for her.  She would like to do this while she is in the  hospital.  We will plan on doing this tomorrow afternoon as long as the cellulitis is improving there is no contraindications to surgery.  We discussed the surgery as well as postoperative course.  Discussed alternatives, risks, complications of the surgery.  Discussed risks including but not limited to spread of infection, amputation, delayed or nonhealing, loss of foot, blood clots, heart attack, stroke, death.  She has been medically cleared to have surgery by her cardiologist. - Continue IV vancomycin and Zosyn for now.  Will obtain wound culture during surgery -Recommend nonweightbearing postoperatively.  Recommend cam boot most likely we can try to get her knee scooter at her request. -Will continue to follow.  -Patient encouraged to call the office with any questions, concerns, change in symptoms.  Celesta Gentile, DPM O: [phone] C: [phone]

## 2018-06-15 NOTE — Progress Notes (Signed)
PROGRESS NOTE  Sara Graves DXA:[id number] DOB: September 27, 1941 DOA: 06/14/2018 PCP: [name], PA-C  HPI  Sara Graves is a 77 y.o. year old female with medical history significant for CAD status post NSTEMI (DES on 04/2017, now on aspirin and Prasugrel), CHF, HTN, type 2 diabetes, Spinal stimulator who presented on 06/14/2018 as directed by her podiatrist who was concern for developing right foot cellulitis not responding to oral antibiotics.  Her podiatrist [name] saw her on 06/14/2018 for assessment of chronic right foot ulcer.  She has had subjective fevers and chills with worsening pain and swelling since this weekend.  She was previously on Keflex.  Brief narrative In the ED she was found to have T-max of 101.9, normal leukocytes.  Blood cultures were obtained.  X-ray right foot showed soft tissue swelling.  CT right foot show generalized soft tissue edema, likely cellulitis with no evidence of osteomyelitis.  She was empirically started on IV Zosyn podiatry evaluated patient on 7/29 while in hospital discussed possible fifth metatarsal head resection given chronic ulceration and infection although CT was negative for osteomyelitis.     Subjective Denies any fevers or chills overnight.  Still reports some right-sided foot pain.  Assessment/Plan: Principal Problem:   Wound infection Active Problems:   Hypothyroidism   DM (diabetes mellitus), type 2, uncontrolled ([place])   Essential hypertension   Cellulitis of right foot   Cellulitis of left toe   Right foot cellulitis with chronic ulceration and left toe cellulitis(2nd toe, distal phalanx) - X-ray and CT of right foot (MRI contraindicated in setting of spinal stimulator)shows soft tissue swelling with no signs of osteomyelitis -Left foot x-ray shows soft tissue swelling of distal phalanx of second toe, no evidence of osteomyelitis -Continue IV Zosyn -Monitor blood cultures -Podiatry consulted, considering fifth metatarsal  head resection on 7/31 - Follow-up x-ray of left foot (given left second toe cellulitis) - Febrile on admission but otherwise no signs of sepsis -Continue empiric vancomycin and Zosyn -Cardiology cleared for surgery, will need to continue her DAPT   CAD s/p NSTEMI ( 04/2018) -on aspirin and effient since recent NSTEMI( previously on aspirin and plavix) -Spoke with [name] cardiology), patient okay for surgery, should continue DAPT -Remains asymptomatic currently  CKD stage III, stable -Creatinine at baseline, monitor BMP  Type 2 diabetes -Last A1c on 03/17/2016 -Repeat A1c here -Sliding scale insulin here (at home uses Humalog with meals via V-GO insulin delivery device)   Hypertension, at goal -Continue home amlodipine, Imdur, labetalol  Anxiety, stable -Home BuSpar  Hypothyroidism, stable -Continue Synthroid  Hyperlipidemia, stable -Continue Crestor  GERD, stable -Continue Protonix  Code Status: Full code  Family Communication: No family at bedside  Disposition Plan: N.p.o. at midnight for potential surgery, will need to continue aspirin and Effient as recommended by cardiology given recent NSTEMI   Consultants:  Podiatry     Procedures:  None  Antimicrobials: Anti-infectives (From admission, onward)   Start     Dose/Rate Route Frequency Ordered Stop   06/16/18 0600  vancomycin (VANCOCIN) 1,250 mg in sodium chloride 0.9 % 250 mL IVPB     1,250 mg 166.7 mL/hr over 90 Minutes Intravenous Every 36 hours 06/14/18 1911     06/14/18 2000  piperacillin-tazobactam (ZOSYN) IVPB 3.375 g     3.375 g 12.5 mL/hr over 240 Minutes Intravenous Every 8 hours 06/14/18 1911     06/14/18 1445  vancomycin (VANCOCIN) 1,500 mg in sodium chloride 0.9 % 500  mL IVPB     1,500 mg 250 mL/hr over 120 Minutes Intravenous STAT 06/14/18 1434 06/14/18 1820   06/14/18 1130  piperacillin-tazobactam (ZOSYN) IVPB 3.375 g     3.375 g 100 mL/hr over 30 Minutes Intravenous  Once  06/14/18 1116 06/14/18 1435        Cultures:  7/29 blood cultures  Telemetry: No  DVT prophylaxis: Lovenox   Objective: Vitals:   06/14/18 1040 06/14/18 1543 06/14/18 2108 06/15/18 0427  BP:  (!) 154/51 (!) 125/41 (!) 130/39  Pulse:  70 73 65  Resp:  16 16 18   Temp:  98.9 F (37.2 C) 99.3 F (37.4 C) 98.3 F (36.8 C)  TempSrc:  Oral Oral Oral  SpO2:  97% 91% 95%  Weight: 88.5 kg (195 lb)       Intake/Output Summary (Last 24 hours) at 06/15/2018 1436 Last data filed at 06/15/2018 1030 Gross per 24 hour  Intake 1246.26 ml  Output 1050 ml  Net 196.26 ml   Filed Weights   06/14/18 1040  Weight: 88.5 kg (195 lb)    Exam:  Constitutional:normal appearing female Eyes: EOMI, anicteric, normal conjunctivae ENMT: Oropharynx with moist mucous membranes, normal dentition Cardiovascular: RRR no MRGs, with no peripheral edema Respiratory: Normal respiratory effort, clear breath sounds  Abdomen: Soft,non-tender,  Skin: Ulcer on right lateral forefoot with no surrounding erythema or drainage, erythema on anterior right fore foot, mild erythema of left second digit  Right Foot    Right foot   Left foot    Neurologic: Grossly no focal neuro deficit. Psychiatric:Appropriate affect, and mood. Mental status AAOx3  Data Reviewed: CBC: Recent Labs  Lab 06/14/18 1155 06/15/18 0416  WBC 7.3 7.0  NEUTROABS 5.7  --   HGB 11.4* 10.0*  HCT 34.1* 30.1*  MCV 88.6 89.9  PLT 128* 425*   Basic Metabolic Panel: Recent Labs  Lab 06/14/18 1155 06/15/18 0416  NA 138 138  K 3.8 3.6  CL 105 109  CO2 22 23  GLUCOSE 160* 129*  BUN 26* 25*  CREATININE 1.03* 1.01*  CALCIUM 9.0 8.5*   GFR: Estimated Creatinine Clearance: 51.3 mL/min (A) (by C-G formula based on SCr of 1.01 mg/dL (H)). Liver Function Tests: No results for input(s): AST, ALT, ALKPHOS, BILITOT, PROT, ALBUMIN in the last 168 hours. No results for input(s): LIPASE, AMYLASE in the last 168 hours. No  results for input(s): AMMONIA in the last 168 hours. Coagulation Profile: No results for input(s): INR, PROTIME in the last 168 hours. Cardiac Enzymes: No results for input(s): CKTOTAL, CKMB, CKMBINDEX, TROPONINI in the last 168 hours. BNP (last 3 results) No results for input(s): PROBNP in the last 8760 hours. HbA1C: No results for input(s): HGBA1C in the last 72 hours. CBG: Recent Labs  Lab 06/14/18 1633 06/14/18 2200 06/15/18 0746 06/15/18 1148  GLUCAP 142* 137* 118* 159*   Lipid Profile: No results for input(s): CHOL, HDL, LDLCALC, TRIG, CHOLHDL, LDLDIRECT in the last 72 hours. Thyroid Function Tests: No results for input(s): TSH, T4TOTAL, FREET4, T3FREE, THYROIDAB in the last 72 hours. Anemia Panel: No results for input(s): VITAMINB12, FOLATE, FERRITIN, TIBC, IRON, RETICCTPCT in the last 72 hours. Urine analysis:    Component Value Date/Time   COLORURINE YELLOW 06/15/2018 0419   APPEARANCEUR CLEAR 06/15/2018 0419   LABSPEC 1.036 (H) 06/15/2018 0419   PHURINE 5.0 06/15/2018 0419   GLUCOSEU >=500 (A) 06/15/2018 0419   HGBUR NEGATIVE 06/15/2018 0419   HGBUR negative 12/03/2010 1405   BILIRUBINUR NEGATIVE  06/15/2018 0419   BILIRUBINUR NEG 06/18/2012 0937   KETONESUR 5 (A) 06/15/2018 0419   PROTEINUR 30 (A) 06/15/2018 0419   UROBILINOGEN 1.0 05/03/2015 1055   NITRITE NEGATIVE 06/15/2018 0419   LEUKOCYTESUR NEGATIVE 06/15/2018 0419   Sepsis Labs: @LABRCNTIP (procalcitonin:4,lacticidven:4)  )No results found for this or any previous visit (from the past 240 hour(s)).    Studies: Ct Foot Right W Contrast  Result Date: 06/14/2018 CLINICAL DATA:  Right foot pain and swelling with fever for several days. History of right foot callus debridement. History of diabetes. EXAM: CT OF THE LOWER RIGHT EXTREMITY WITH CONTRAST TECHNIQUE: Multidetector CT imaging of the right foot was performed according to the standard protocol following intravenous contrast administration.  COMPARISON:  Radiographs 06/14/2018 and 06/09/2018. CONTRAST:  112mL ISOVUE-300 IOPAMIDOL (ISOVUE-300) INJECTION 61% FINDINGS: Bones/Joint/Cartilage There is no evidence of acute fracture, dislocation or bone destruction. The bones appear demineralized. There are stable postsurgical changes in the 1st and 2nd metatarsal heads with a metallic pin in the 1st metatarsal neck and a cortical screw in the 2nd metatarsal head. The proximal and middle phalanges of the 2nd toe appear fused. Ligaments Suboptimally assessed by CT. Muscles and Tendons The ankle tendons appear grossly intact. No focal muscular abnormalities are seen. Soft tissues The soft tissues appear diffusely edematous. No focal fluid collection, foreign body or soft tissue emphysema identified. There is no obvious skin ulceration. IMPRESSION: 1. Nonspecific generalized soft tissue edema, likely cellulitis. Correlate clinically. No focal fluid collection or skin ulceration identified. 2. No acute osseous findings or evidence of osteomyelitis. 3. Postsurgical changes as described. Electronically Signed   By: Richardean Sale M.D.   On: 06/14/2018 17:48   Dg Foot Complete Left  Result Date: 06/15/2018 CLINICAL DATA:  Left foot pain centered at the left second toe. EXAM: LEFT FOOT - COMPLETE 3+ VIEW COMPARISON:  AP and lateral views of the left foot dated Mar 17, 2018 FINDINGS: The patient has undergone bunionectomy. There is also a cortical screw through the head of the second metatarsal. There is mild degenerative change of the first and second MTP joints. There is fusion across the PIP joint of the second toe. There is contour deformity of the medial aspect of the articular surface of the middle phalanx of the second toe at the DIP joint which could reflect an acute fracture. The third, fourth, and fifth toes are unremarkable. There are degenerative changes of the tarsometatarsal joints especially laterally. There is mild soft tissue swelling involving the  distal phalanx of the second toe. No soft tissue gas collections or abnormal calcifications are observed here. IMPRESSION: Postsurgical changes involving the heads of the first and second metatarsals. Moderate degenerative changes of the first and second MTP joints. Fusion across the PIP joint of the second toe. Possible post traumatic fracture of the distal aspect of the middle phalanx of the second toe involving the DIP joint. Soft tissue swelling of the distal phalanx of the second toe may be be post traumatic or reflect cellulitis. No objective evidence of osteomyelitis. Electronically Signed   By: David  Martinique M.D.   On: 06/15/2018 09:33    Scheduled Meds: . amLODipine  5 mg Oral Daily  . aspirin  81 mg Oral Daily  . busPIRone  7.5 mg Oral BID  . enoxaparin (LOVENOX) injection  40 mg Subcutaneous Q24H  . ezetimibe  10 mg Oral Daily  . insulin aspart  0-15 Units Subcutaneous TID WC  . insulin aspart  0-5 Units Subcutaneous  QHS  . isosorbide mononitrate  60 mg Oral Daily  . labetalol  300 mg Oral BID  . levothyroxine  50 mcg Oral QAC breakfast  . pantoprazole  40 mg Oral BID  . potassium chloride SA  20 mEq Oral BID  . prasugrel  10 mg Oral Daily  . rosuvastatin  40 mg Oral QHS    Continuous Infusions: . sodium chloride 500 mL (06/15/18 0951)  . piperacillin-tazobactam (ZOSYN)  IV 3.375 g (06/15/18 1251)  . [START ON 06/16/2018] vancomycin       LOS: 1 day     Desiree Hane, MD [facility] Pager [phone]  If 7PM-7AM, please contact night-coverage [email or web address] Password [email or web address] 06/15/2018, 2:36 PM

## 2018-06-16 ENCOUNTER — Inpatient Hospital Stay (HOSPITAL_COMMUNITY): Payer: Medicare Other | Admitting: Anesthesiology

## 2018-06-16 ENCOUNTER — Encounter (HOSPITAL_COMMUNITY)
Admission: EM | Disposition: A | Discharge status: Home / Self Care | Payer: Self-pay | Source: Home / Self Care | Attending: Internal Medicine

## 2018-06-16 ENCOUNTER — Encounter (HOSPITAL_COMMUNITY): Payer: Self-pay | Admitting: *Deleted

## 2018-06-16 ENCOUNTER — Inpatient Hospital Stay (HOSPITAL_COMMUNITY): Payer: Medicare Other

## 2018-06-16 DIAGNOSIS — L03119 Cellulitis of unspecified part of limb: Secondary | ICD-10-CM

## 2018-06-16 DIAGNOSIS — E1149 Type 2 diabetes mellitus with other diabetic neurological complication: Secondary | ICD-10-CM

## 2018-06-16 DIAGNOSIS — E039 Hypothyroidism, unspecified: Secondary | ICD-10-CM

## 2018-06-16 DIAGNOSIS — L03032 Cellulitis of left toe: Secondary | ICD-10-CM

## 2018-06-16 DIAGNOSIS — I214 Non-ST elevation (NSTEMI) myocardial infarction: Secondary | ICD-10-CM

## 2018-06-16 DIAGNOSIS — M2010 Hallux valgus (acquired), unspecified foot: Secondary | ICD-10-CM

## 2018-06-16 DIAGNOSIS — L089 Local infection of the skin and subcutaneous tissue, unspecified: Secondary | ICD-10-CM

## 2018-06-16 DIAGNOSIS — L0390 Cellulitis, unspecified: Secondary | ICD-10-CM

## 2018-06-16 DIAGNOSIS — L03115 Cellulitis of right lower limb: Secondary | ICD-10-CM

## 2018-06-16 DIAGNOSIS — T148XXA Other injury of unspecified body region, initial encounter: Secondary | ICD-10-CM

## 2018-06-16 DIAGNOSIS — I10 Essential (primary) hypertension: Secondary | ICD-10-CM

## 2018-06-16 DIAGNOSIS — E1165 Type 2 diabetes mellitus with hyperglycemia: Secondary | ICD-10-CM

## 2018-06-16 DIAGNOSIS — M79609 Pain in unspecified limb: Secondary | ICD-10-CM

## 2018-06-16 HISTORY — PX: METATARSAL HEAD EXCISION: SHX5027

## 2018-06-16 LAB — TYPE AND SCREEN
ABO/RH(D): A POS
ANTIBODY SCREEN: NEGATIVE

## 2018-06-16 LAB — CBC WITH DIFFERENTIAL/PLATELET
BASOS PCT: 0 %
Basophils Absolute: 0 10*3/uL (ref 0.0–0.1)
EOS ABS: 0.2 10*3/uL (ref 0.0–0.7)
EOS PCT: 3 %
HCT: 29.8 % — ABNORMAL LOW (ref 36.0–46.0)
Hemoglobin: 10.1 g/dL — ABNORMAL LOW (ref 12.0–15.0)
LYMPHS ABS: 1.3 10*3/uL (ref 0.7–4.0)
Lymphocytes Relative: 21 %
MCH: 30.8 pg (ref 26.0–34.0)
MCHC: 33.9 g/dL (ref 30.0–36.0)
MCV: 90.9 fL (ref 78.0–100.0)
Monocytes Absolute: 0.6 10*3/uL (ref 0.1–1.0)
Monocytes Relative: 9 %
Neutro Abs: 4.1 10*3/uL (ref 1.7–7.7)
Neutrophils Relative %: 67 %
PLATELETS: 139 10*3/uL — AB (ref 150–400)
RBC: 3.28 MIL/uL — AB (ref 3.87–5.11)
RDW: 14.7 % (ref 11.5–15.5)
WBC: 6.1 10*3/uL (ref 4.0–10.5)

## 2018-06-16 LAB — MAGNESIUM: Magnesium: 2.1 mg/dL (ref 1.7–2.4)

## 2018-06-16 LAB — CREATININE, SERUM
CREATININE: 1.05 mg/dL — AB (ref 0.44–1.00)
GFR calc Af Amer: 58 mL/min — ABNORMAL LOW (ref >60)
GFR calc non Af Amer: 50 mL/min — ABNORMAL LOW (ref >60)

## 2018-06-16 LAB — COMPREHENSIVE METABOLIC PANEL
ALT: 9 U/L (ref 0–44)
AST: 11 U/L — ABNORMAL LOW (ref 15–41)
Albumin: 2.8 g/dL — ABNORMAL LOW (ref 3.5–5.0)
Alkaline Phosphatase: 61 U/L (ref 38–126)
Anion gap: 7 (ref 5–15)
BUN: 26 mg/dL — ABNORMAL HIGH (ref 8–23)
CO2: 23 mmol/L (ref 22–32)
Calcium: 8.5 mg/dL — ABNORMAL LOW (ref 8.9–10.3)
Chloride: 111 mmol/L (ref 98–111)
Creatinine, Ser: 1.05 mg/dL — ABNORMAL HIGH (ref 0.44–1.00)
GFR calc non Af Amer: 50 mL/min — ABNORMAL LOW (ref >60)
GFR, EST AFRICAN AMERICAN: 58 mL/min — AB (ref >60)
Glucose, Bld: 167 mg/dL — ABNORMAL HIGH (ref 70–99)
Potassium: 4.1 mmol/L (ref 3.5–5.1)
SODIUM: 141 mmol/L (ref 135–145)
Total Bilirubin: 0.5 mg/dL (ref 0.3–1.2)
Total Protein: 6.2 g/dL — ABNORMAL LOW (ref 6.5–8.1)

## 2018-06-16 LAB — GLUCOSE, CAPILLARY
GLUCOSE-CAPILLARY: 100 mg/dL — AB (ref 70–99)
GLUCOSE-CAPILLARY: 103 mg/dL — AB (ref 70–99)
Glucose-Capillary: 139 mg/dL — ABNORMAL HIGH (ref 70–99)
Glucose-Capillary: 121 mg/dL — ABNORMAL HIGH (ref 70–99)
Glucose-Capillary: 98 mg/dL (ref 70–99)
Glucose-Capillary: 103 mg/dL — ABNORMAL HIGH (ref 70–99)

## 2018-06-16 LAB — PHOSPHORUS: Phosphorus: 2.7 mg/dL (ref 2.5–4.6)

## 2018-06-16 LAB — SURGICAL PCR SCREEN
MRSA, PCR: NEGATIVE
STAPHYLOCOCCUS AUREUS: POSITIVE — AB

## 2018-06-16 SURGERY — EXCISION, METATARSAL BONE, HEAD
Anesthesia: Monitor Anesthesia Care | Laterality: Right

## 2018-06-16 MED ORDER — LACTATED RINGERS IV SOLN
INTRAVENOUS | Status: DC
  Administered 2018-06-16: 18:00:00 via INTRAVENOUS

## 2018-06-16 MED ORDER — ONDANSETRON HCL 4 MG/2ML IJ SOLN
INTRAMUSCULAR | Status: DC | PRN
  Administered 2018-06-16: 4 mg via INTRAVENOUS

## 2018-06-16 MED ORDER — FENTANYL CITRATE (PF) 100 MCG/2ML IJ SOLN
25.0000 ug | INTRAMUSCULAR | Status: DC | PRN
  Administered 2018-06-16: 12.5 ug via INTRAVENOUS
  Administered 2018-06-16: 25 ug via INTRAVENOUS

## 2018-06-16 MED ORDER — LIDOCAINE-EPINEPHRINE (PF) 1 %-1:200000 IJ SOLN
INTRAMUSCULAR | Status: AC
  Filled 2018-06-16: qty 30

## 2018-06-16 MED ORDER — FENTANYL CITRATE (PF) 100 MCG/2ML IJ SOLN
INTRAMUSCULAR | Status: AC
  Administered 2018-06-16: 12.5 ug via INTRAVENOUS
  Filled 2018-06-16: qty 2

## 2018-06-16 MED ORDER — CHLORHEXIDINE GLUCONATE CLOTH 2 % EX PADS
6.0000 | MEDICATED_PAD | Freq: Every day | CUTANEOUS | Status: DC
  Administered 2018-06-16 – 2018-06-19 (×4): 6 via TOPICAL

## 2018-06-16 MED ORDER — PROPOFOL 10 MG/ML IV BOLUS
INTRAVENOUS | Status: AC
  Filled 2018-06-16: qty 20

## 2018-06-16 MED ORDER — EPHEDRINE SULFATE-NACL 50-0.9 MG/10ML-% IV SOSY
PREFILLED_SYRINGE | INTRAVENOUS | Status: DC | PRN
  Administered 2018-06-16: 10 mg via INTRAVENOUS

## 2018-06-16 MED ORDER — LIDOCAINE-EPINEPHRINE (PF) 1 %-1:200000 IJ SOLN
INTRAMUSCULAR | Status: DC | PRN
  Administered 2018-06-16: 10 mL

## 2018-06-16 MED ORDER — PROPOFOL 10 MG/ML IV BOLUS
INTRAVENOUS | Status: DC | PRN
  Administered 2018-06-16: 100 mg via INTRAVENOUS

## 2018-06-16 MED ORDER — MEPERIDINE HCL 50 MG/ML IJ SOLN: 6.2500 mg | INTRAMUSCULAR | Status: DC | PRN

## 2018-06-16 MED ORDER — LIDOCAINE 2% (20 MG/ML) 5 ML SYRINGE
INTRAMUSCULAR | Status: DC | PRN
  Administered 2018-06-16: 60 mg via INTRAVENOUS

## 2018-06-16 MED ORDER — MUPIROCIN 2 % EX OINT
1.0000 "application " | TOPICAL_OINTMENT | Freq: Two times a day (BID) | CUTANEOUS | Status: DC
  Administered 2018-06-16 – 2018-06-19 (×7): 1 via NASAL
  Filled 2018-06-16 (×4): qty 22

## 2018-06-16 MED ORDER — PHENYLEPHRINE 40 MCG/ML (10ML) SYRINGE FOR IV PUSH (FOR BLOOD PRESSURE SUPPORT)
PREFILLED_SYRINGE | INTRAVENOUS | Status: DC | PRN
  Administered 2018-06-16: 80 ug via INTRAVENOUS

## 2018-06-16 SURGICAL SUPPLY — 48 items
BAG SPEC THK2 15X12 ZIP CLS (MISCELLANEOUS)
BAG ZIPLOCK 12X15 (MISCELLANEOUS) IMPLANT
BALL CTTN STRL GZE (GAUZE/BANDAGES/DRESSINGS) ×1
BLADE OSCILLATING/SAGITTAL (BLADE) ×3
BLADE SURG 15 STRL LF DISP TIS (BLADE) ×2 IMPLANT
BLADE SURG 15 STRL SS (BLADE) ×6
BLADE SURG SZ10 CARB STEEL (BLADE) ×3 IMPLANT
BLADE SW THK.38XMED LNG THN (BLADE) ×1 IMPLANT
BNDG COHESIVE 3X5 TAN STRL LF (GAUZE/BANDAGES/DRESSINGS) ×3 IMPLANT
BNDG CONFORM 3 STRL LF (GAUZE/BANDAGES/DRESSINGS) ×3 IMPLANT
COTTON BALL STERILE (GAUZE/BANDAGES/DRESSINGS) ×3
COTTON BALL STERILE 2 PK (GAUZE/BANDAGES/DRESSINGS) ×1 IMPLANT
COVER SURGICAL LIGHT HANDLE (MISCELLANEOUS) ×3 IMPLANT
CUFF TOURN SGL QUICK 34 (TOURNIQUET CUFF) ×3
CUFF TRNQT CYL 34X4X40X1 (TOURNIQUET CUFF) ×1 IMPLANT
DRAPE OEC MINIVIEW 54X84 (DRAPES) ×3 IMPLANT
DRSG EMULSION OIL 3X3 NADH (GAUZE/BANDAGES/DRESSINGS) ×3 IMPLANT
DURAPREP 26ML APPLICATOR (WOUND CARE) IMPLANT
ELECT REM PT RETURN 15FT ADLT (MISCELLANEOUS) ×3 IMPLANT
GAUZE 4X4 16PLY RFD (DISPOSABLE) ×3 IMPLANT
GAUZE SPONGE 2X2 8PLY STRL LF (GAUZE/BANDAGES/DRESSINGS) ×1 IMPLANT
GAUZE SPONGE 4X4 12PLY STRL (GAUZE/BANDAGES/DRESSINGS) ×3 IMPLANT
GLOVE BIOGEL PI IND STRL 8.5 (GLOVE) ×1 IMPLANT
GLOVE BIOGEL PI INDICATOR 8.5 (GLOVE) ×2
GLOVE ECLIPSE 8.0 STRL XLNG CF (GLOVE) ×6 IMPLANT
GOWN STRL REUS W/TWL LRG LVL3 (GOWN DISPOSABLE) ×6 IMPLANT
GOWN STRL REUS W/TWL XL LVL3 (GOWN DISPOSABLE) IMPLANT
K-WIRE CAPS BLUE STERILE .035 (WIRE)
K-WIRE CAPS STERILE WHITE .045 (WIRE) IMPLANT
K-WIRE CAPS STERILE YELLOW .02 (WIRE)
K-WIRE SMTH SNGL TROCAR .028X4 (WIRE)
KIT BASIN OR (CUSTOM PROCEDURE TRAY) ×3 IMPLANT
KWIRE 4.0 X .045IN (WIRE) IMPLANT
KWIRE CAPS BLUE STRL .035 (WIRE) IMPLANT
KWIRE CAPS STRL YELLOW .02 (WIRE) IMPLANT
KWIRE SMTH SNGL TROCAR .028X4 (WIRE) IMPLANT
MANIFOLD NEPTUNE II (INSTRUMENTS) ×3 IMPLANT
NS IRRIG 1000ML POUR BTL (IV SOLUTION) ×3 IMPLANT
PACK ORTHO EXTREMITY (CUSTOM PROCEDURE TRAY) ×3 IMPLANT
PAD CAST 4YDX4 CTTN HI CHSV (CAST SUPPLIES) ×1 IMPLANT
PADDING CAST COTTON 4X4 STRL (CAST SUPPLIES) ×3
PIN CAPS ORTHO GREEN .062 (PIN) IMPLANT
POSITIONER SURGICAL ARM (MISCELLANEOUS) ×3 IMPLANT
SPONGE GAUZE 2X2 STER 10/PKG (GAUZE/BANDAGES/DRESSINGS) ×2
SUT ETHILON 4 0 PS 2 18 (SUTURE) IMPLANT
SUT VICRYL 0 27 CT2 27 ABS (SUTURE) IMPLANT
TOWEL OR 17X26 10 PK STRL BLUE (TOWEL DISPOSABLE) ×3 IMPLANT
WATER STERILE IRR 1000ML POUR (IV SOLUTION) IMPLANT

## 2018-06-16 NOTE — Brief Op Note (Signed)
06/16/2018  7:22 PM  PATIENT:  Tito Dine  77 y.o. female  PRE-OPERATIVE DIAGNOSIS:  Right foot ulceration  POST-OPERATIVE DIAGNOSIS:  right foot ulcer  PROCEDURE:  Procedure(s) with comments: right 5th metatarsal excision, a mini c-arm (Right) - anesthesia can do block  SURGEON:  Surgeon(s) and Role:    * Trula Slade, DPM - Primary  PHYSICIAN ASSISTANT:   ASSISTANTS: none   ANESTHESIA:   general  EBL:  Less than 50 cc  BLOOD ADMINISTERED:none  DRAINS: none   LOCAL MEDICATIONS USED:  OTHER 8 cc 1% lidocaine with epi  SPECIMEN:  Source of Specimen:  wound culture; bone culture/pathology  DISPOSITION OF SPECIMEN:  PATHOLOGY  COUNTS:  YES  TOURNIQUET:  * Missing tourniquet times found for documented tourniquets in log: [id number] *  DICTATION: .Dragon Dictation  PLAN OF CARE: inpatient   PATIENT DISPOSITION:  PACU - hemodynamically stable.   Delay start of Pharmacological VTE agent (>24hrs) due to surgical blood loss or risk of bleeding: no  Intraoperative findings: There was quite a bit of purulence along the dorsal 5th metatarsal head and tracking proximally and laterally over toward the 3rd metatarsal head. Wound culture obtained. Bone sent for pathology and microbiology. Minimal bleeding. At the conclusion of the procedure she was brought to PACU with vital signs stable and vascular status intact with an immediate capillary refill time to the toes.   Recommend to stay inpatient until at least Friday. Will change the bandage tomorrow (Thursday) evening. Awaiting culture results. NWB. Will need home health for dressing changes for saline wet to dry packing to the wound followed by a dry sterile dressing.    Will continue to follow.

## 2018-06-16 NOTE — Progress Notes (Signed)
Subjective: Sara Graves is a 77 y.o. year old female with medical history significant for CAD status post NSTEMI (DES on 04/2017, now on aspirin and Prasugrel), CHF, HTN, type 2 diabetes, Spinal stimulator who presented on 06/14/2018 as directed by her podiatrist who was concern for developing right foot cellulitis not responding to oral antibiotics.  Her podiatrist [name] saw her on 06/14/2018 for assessment of chronic right foot ulcer.  She has had subjective fevers and chills with worsening pain and swelling since this weekend.  She was previously on Keflex.  She states that she is still experiencing some pain to the foot. It is better than what it was when she got admitted but still experiencing pain. Her daughter-in-law was at bedside today who states she has been complaining of pain for some time before she came to the office.    Objective: AAO x3, NAD DP/PT pulses palpable bilaterally and can palpate them better today as swelling is improved, CRT less than 3 seconds There is still quite a bit of cellulitis/erythema to the dorsal lateral foot. The cellulitis actually appears to be extending past where the line was drawn previously on the lateral aspect of the foot. There is no improvement in the cellulitis today. There is minimal drainage from the wound submet 5. No fluctuance or crepitance. No malodor.  Edema erythema decreased the left second toe No pain with calf compression, swelling, warmth, erythema  Assessment: Chronic ulceration right foot with continued cellulitis  Plan: -All treatment options discussed with the patient including all alternatives, risks, complications.  -Reviewed her chart, labs, vitals. Reviewed ABI with her today as well.  -At this point discussed with her surgery. She has continued cellulitis, pain despite 3 days of IV antibiotics. Discussed wound debridement and likely 5th metatarsal head excision. Although she is cleared for surgery she cannot stop  anti-coagulation and discussed bleeding is a risk. Discussed consent form for blood transfusion and she is OK with this if needed. Discussed other risks of surgery, including but not limited to, further surgery, amputation, loss of blood, spread of infection, delayed/nonhealing, scar, MI, stroke, death. She wants to proceed with surgery. I also think if she is going to have surgery for this like discussed with [name] it is best to do while an inpatient.  She and her daughter-in-law have no further questions or concerns. -NWB postop (would like a knee scooter if able). If this does not get arranged I can do this for her through [place].  -Continue IV abx for now. Will obtain wound culture intra-op  Celesta Gentile, DPM O: [phone] C: [phone]

## 2018-06-16 NOTE — Anesthesia Procedure Notes (Signed)
Procedure Name: LMA Insertion Date/Time: 06/16/2018 6:07 PM Performed by: [place] Ely Spragg C, CRNA Pre-anesthesia Checklist: Patient identified, Emergency Drugs available, Suction available and Patient being monitored Patient Re-evaluated:Patient Re-evaluated prior to induction Oxygen Delivery Method: Circle system utilized Preoxygenation: Pre-oxygenation with 100% oxygen Induction Type: IV induction Ventilation: Mask ventilation without difficulty LMA: LMA inserted LMA Size: 4.0 Number of attempts: 1 Airway Equipment and Method: Bite block Placement Confirmation: positive ETCO2 Tube secured with: Tape Dental Injury: Teeth and Oropharynx as per pre-operative assessment

## 2018-06-16 NOTE — Transfer of Care (Signed)
Immediate Anesthesia Transfer of Care Note  Patient: Sara Graves  Procedure(s) Performed: right 5th metatarsal excision, a mini c-arm (Right )  Patient Location: PACU  Anesthesia Type:General  Level of Consciousness: awake, alert  and oriented  Airway & Oxygen Therapy: Patient Spontanous Breathing and Patient connected to face mask oxygen  Post-op Assessment: Report given to RN and Post -op Vital signs reviewed and stable  Post vital signs: Reviewed and stable  Last Vitals:  Vitals Value Taken Time  BP    Temp    Pulse    Resp    SpO2      Last Pain:  Vitals:   06/16/18 1139  TempSrc:   PainSc: Asleep      Patients Stated Pain Goal: 2 (91/36/85 9923)  Complications: No apparent anesthesia complications

## 2018-06-16 NOTE — Progress Notes (Signed)
PROGRESS NOTE    Sara Graves  ZOX:[id number] DOB: July 10, 1941 DOA: 06/14/2018 PCP: [name], PA-C   Brief Narrative:   Sara Graves is a 77 y.o. year old female with medical history significant for CAD status post NSTEMI (DES on 04/2017, now on aspirin and Prasugrel), systolic and Diastolic CHF, HTN, type 2 diabetes, Spinal stimulator who presented on 06/14/2018 as directed by her podiatrist who was concern for developing right foot cellulitis not responding to oral antibiotics.  Her podiatrist [name] saw her on 06/14/2018 for assessment of chronic right foot ulcer. She has had subjective fevers and chills with worsening pain and swelling since this weekend.  She was previously on Keflex.  In the ED she was found to have T-max of 101.9, normal leukocytes.  Blood cultures were obtained.  X-ray right foot showed soft tissue swelling.  CT right foot show generalized soft tissue edema, likely cellulitis with no evidence of osteomyelitis.  She was empirically started on IV Zosyn and Vancomycin. Podiatry evaluated patient on 7/29 and while in hospital discussed possible fifth metatarsal head resection with the patient given chronic ulceration and infection although CT was negative for osteomyelitis. Patient to undergo Metatarsal head resection today.   Assessment & Plan:   Principal Problem:   Wound infection Active Problems:   Hypothyroidism   DM (diabetes mellitus), type 2, uncontrolled ([place])   Essential hypertension   NSTEMI (non-ST elevated myocardial infarction) ([place])   Cellulitis of right foot   Cellulitis of left toe  Right foot cellulitis with chronic ulceration and left toe cellulitis(2nd toe, distal phalanx) -X-ray and CT of right foot (MRI contraindicated in setting of spinal stimulator)shows soft tissue swelling with no signs of osteomyelitis -Left foot x-ray shows soft tissue swelling of distal phalanx of second toe, no evidence of osteomyelitis -Monitor blood cultures;  Showed NGTD at 2 days  -Podiatry [name] consulted, planning on fifth metatarsal head resection on 7/31 -X-ray of left foot (given left second toe cellulitis) showed Postsurgical changes involving the heads of the first and second metatarsals. Moderate degenerative changes of the first and second MTP joints. Fusion across the PIP joint of the second toe. Possible post traumatic fracture of the distal aspect of the middle phalanx of the second toe involving the DIP joint. Soft tissue swelling of the distal phalanx of the second toe may be be post traumatic or reflect cellulitis. No objective evidence of osteomyelitis. -Febrile on admission but otherwise no signs of sepsis -Continue Empiric IV Vancomycin and Zosyn for now and Podiatry to check Wound Cx during surgery  -Cardiology cleared for surgery, will need to continue her DAPT  -Checked Non-invasive arterial studies and as below were normal  -Podiatry recommending Non-Weightbearing postoperatively and recommend CAM boot   CAD s/p NSTEMI (04/2018) -On Aspirin 81 mg po Daily and Prasugrel 10 mg po Daily since recent NSTEMI (previously on aspirin and plavix) -[name] spoke with [name] [facility] cardiology) yesterday and patient okay for surgery, should continue DAPT -Remains asymptomatic currently  CKD stage III, stable -Stable and at Baseline -BUN/Cr was 26/1.05 -Continue to Monitor and Repeat CMP in AM   Type 2 Diabetes -Last A1c on 03/17/2016 -Repeat A1c here was 8.9 -Started Moderate Novolog SSI AC/HS; (at home uses Humalog with meals via V-GO insulin delivery device) -Continue to Monitor CBG's   Hypertension -BP was slightly elevated at 156/46 this AM -Continue home Amlodipine 5 mg po Daily, Isosorbide Mononitrate 60 mg po Daily, and  Labetalol 300 mg po BID   Anxiety, stable -C/w Home Buspirone 7.5 mg po BID   Hypothyroidism, stable -Continue Levothyroxine 50 mcg po Daily   Hyperlipidemia,  stable -Continue Rosuvastatin 40 mg po qHS and Ezetimbie 10 mg po daily   GERD, stable -Continue Pantoprazole 40 mg po BID  DVT prophylaxis: Enoxaparin 40 mg sq q24h Code Status: FULL CODE Family Communication: No family present at bedside  Disposition Plan: Pending PT Evaluation and Podiatry Clearance   Consultants:   Podiatry [name]   Procedures:  ARTERIAL  ABI completed:    RIGHT    LEFT    PRESSURE WAVEFORM  PRESSURE WAVEFORM  BRACHIAL 179 Triphasic BRACHIAL 191 1.07  DP 199 Biphasic DP 191 Biphasic  AT   AT    PT 139 Biphasic PT 1164 Monophasic  PER   PER    GREAT TOE 37 NA GREAT TOE 80 NA    RIGHT LEFT  ABI 1.11 1.07  TBI 0.21 0.45   Bilateral ABIs are within normal limits at rest. Bilateral TBIs are abnormal at rest.   Antimicrobials:  Anti-infectives (From admission, onward)   Start     Dose/Rate Route Frequency Ordered Stop   06/16/18 0600  vancomycin (VANCOCIN) 1,250 mg in sodium chloride 0.9 % 250 mL IVPB     1,250 mg 166.7 mL/hr over 90 Minutes Intravenous Every 36 hours 06/14/18 1911     06/14/18 2000  piperacillin-tazobactam (ZOSYN) IVPB 3.375 g     3.375 g 12.5 mL/hr over 240 Minutes Intravenous Every 8 hours 06/14/18 1911     06/14/18 1445  vancomycin (VANCOCIN) 1,500 mg in sodium chloride 0.9 % 500 mL IVPB     1,500 mg 250 mL/hr over 120 Minutes Intravenous STAT 06/14/18 1434 06/14/18 1820   06/14/18 1130  piperacillin-tazobactam (ZOSYN) IVPB 3.375 g     3.375 g 100 mL/hr over 30 Minutes Intravenous  Once 06/14/18 1116 06/14/18 1435     Subjective: Seen and examined at bedside and states she is doing okay.  States that her foot was sore and that she did not sleep very well last night.  No chest pain, shortness breath, nausea, vomiting.  No other concerns or complaints at this time and understands that she is going for a metatarsal head resection later today.  Objective: Vitals:   06/14/18 2108 06/15/18  0427 06/15/18 1449 06/15/18 2141  BP: (!) 125/41 (!) 130/39 (!) 109/39 (!) 131/43  Pulse: 73 65 (!) 57 65  Resp: 16 18 17 18   Temp: 99.3 F (37.4 C) 98.3 F (36.8 C) 97.9 F (36.6 C) 99.7 F (37.6 C)  TempSrc: Oral Oral Oral Oral  SpO2: 91% 95% 95% 93%  Weight:        Intake/Output Summary (Last 24 hours) at 06/16/2018 8366 Last data filed at 06/16/2018 0541 Gross per 24 hour  Intake 1279.44 ml  Output 1800 ml  Net -520.56 ml   Filed Weights   06/14/18 1040  Weight: 88.5 kg (195 lb)   Examination: Physical Exam:  Constitutional: WN/WD obese Caucasian female in NAD and appears calm and comfortable Eyes: Lids and conjunctivae normal, sclerae anicteric  ENMT: External Ears, Nose appear normal. Grossly normal hearing. Mucous membranes are moist. Neck: Appears normal, supple, no cervical masses, normal ROM, no appreciable thyromegaly, no JVD Respiratory: Diminished to auscultation bilaterally, no wheezing, rales, rhonchi or crackles. Normal respiratory effort and patient is not tachypenic. No accessory muscle use.  Cardiovascular: Regular Rhythm but slightly bradycardic  rate; Slight murmur. Trace extremity edema.  Abdomen: Soft, non-tender, Distended 2/2 to body habitus. No masses palpated. No appreciable hepatosplenomegaly. Bowel sounds positive.  GU: Deferred. Musculoskeletal: No contractures noted. No joint deformity upper and lower extremities. Good ROM Skin: Left 2nd toe slightly erythematous and swollen. Right Foot is wrapped in an ACE bandage. No induration; Warm and dry.  Neurologic: CN 2-12 grossly intact with no focal deficits. Romberg sign and cerebellar reflexes not assessed.  Psychiatric: Normal judgment and insight. Alert and oriented x 3. Normal mood and appropriate affect.   Data Reviewed: I have personally reviewed following labs and imaging studies  CBC: Recent Labs  Lab 06/14/18 1155 06/15/18 0416  WBC 7.3 7.0  NEUTROABS 5.7  --   HGB 11.4* 10.0*  HCT  34.1* 30.1*  MCV 88.6 89.9  PLT 128* 532*   Basic Metabolic Panel: Recent Labs  Lab 06/14/18 1155 06/15/18 0416 06/16/18 0422  NA 138 138  --   K 3.8 3.6  --   CL 105 109  --   CO2 22 23  --   GLUCOSE 160* 129*  --   BUN 26* 25*  --   CREATININE 1.03* 1.01* 1.05*  CALCIUM 9.0 8.5*  --    GFR: Estimated Creatinine Clearance: 49.3 mL/min (A) (by C-G formula based on SCr of 1.05 mg/dL (H)). Liver Function Tests: No results for input(s): AST, ALT, ALKPHOS, BILITOT, PROT, ALBUMIN in the last 168 hours. No results for input(s): LIPASE, AMYLASE in the last 168 hours. No results for input(s): AMMONIA in the last 168 hours. Coagulation Profile: No results for input(s): INR, PROTIME in the last 168 hours. Cardiac Enzymes: No results for input(s): CKTOTAL, CKMB, CKMBINDEX, TROPONINI in the last 168 hours. BNP (last 3 results) No results for input(s): PROBNP in the last 8760 hours. HbA1C: Recent Labs    06/15/18 0416  HGBA1C 8.9*   CBG: Recent Labs  Lab 06/15/18 0746 06/15/18 1148 06/15/18 1707 06/15/18 2033 06/16/18 0714  GLUCAP 118* 159* 233* 225* 139*   Lipid Profile: No results for input(s): CHOL, HDL, LDLCALC, TRIG, CHOLHDL, LDLDIRECT in the last 72 hours. Thyroid Function Tests: No results for input(s): TSH, T4TOTAL, FREET4, T3FREE, THYROIDAB in the last 72 hours. Anemia Panel: No results for input(s): VITAMINB12, FOLATE, FERRITIN, TIBC, IRON, RETICCTPCT in the last 72 hours. Sepsis Labs: Recent Labs  Lab 06/14/18 1204  LATICACIDVEN 0.93    Recent Results (from the past 240 hour(s))  Culture, blood (Routine X 2) w Reflex to ID Panel     Status: None (Preliminary result)   Collection Time: 06/14/18 11:55 AM  Result Value Ref Range Status   Specimen Description   Final    BLOOD RIGHT WRIST Performed at [facility] [address] [place], [place] [address]    Special Requests   Final    BOTTLES DRAWN AEROBIC AND ANAEROBIC Blood Culture  adequate volume Performed at [facility] [address], [place], [place] [address]    Culture   Final    NO GROWTH 1 DAY Performed at [facility], [place] [address], [place],  [address]    Report Status PENDING  Incomplete  Culture, blood (Routine X 2) w Reflex to ID Panel     Status: None (Preliminary result)   Collection Time: 06/14/18 11:55 AM  Result Value Ref Range Status   Specimen Description   Final    BLOOD FOREARM Performed at [facility], [address] [place], [place] [address]    Special Requests   Final    BOTTLES DRAWN AEROBIC AND ANAEROBIC Blood Culture adequate volume Performed at [facility] [address] [place], [place] [address]    Culture   Final    NO GROWTH 1 DAY Performed at [facility], [place] [address], [place], [place] [address]    Report Status PENDING  Incomplete  Surgical pcr screen     Status: Abnormal   Collection Time: 06/15/18 10:24 PM  Result Value Ref Range Status   MRSA, PCR NEGATIVE NEGATIVE Final   Staphylococcus aureus POSITIVE (A) NEGATIVE Final    Comment: (NOTE) The Xpert SA Assay (FDA approved for NASAL specimens in patients 14 years of age and older), is one component of a comprehensive surveillance program. It is not intended to diagnose infection nor to guide or monitor treatment. Performed at [facility], [place] [address] [place],  [address]     Radiology Studies: Dg Chest 2 View  Result Date: 06/14/2018 CLINICAL DATA:  Right-sided dorsal foot pain. Redness and swelling. No trauma. EXAM: CHEST - 2 VIEW COMPARISON:  April 25, 2018 FINDINGS: Mild cardiomegaly. The hila and mediastinum are normal. No pulmonary nodules, masses, or focal infiltrates. No overt edema. No suspicious infiltrate. IMPRESSION: No active cardiopulmonary disease. Electronically Signed   By: Dorise Bullion III M.D   On: 06/14/2018 12:59   Ct Foot  Right W Contrast  Result Date: 06/14/2018 CLINICAL DATA:  Right foot pain and swelling with fever for several days. History of right foot callus debridement. History of diabetes. EXAM: CT OF THE LOWER RIGHT EXTREMITY WITH CONTRAST TECHNIQUE: Multidetector CT imaging of the right foot was performed according to the standard protocol following intravenous contrast administration. COMPARISON:  Radiographs 06/14/2018 and 06/09/2018. CONTRAST:  118mL ISOVUE-300 IOPAMIDOL (ISOVUE-300) INJECTION 61% FINDINGS: Bones/Joint/Cartilage There is no evidence of acute fracture, dislocation or bone destruction. The bones appear demineralized. There are stable postsurgical changes in the 1st and 2nd metatarsal heads with a metallic pin in the 1st metatarsal neck and a cortical screw in the 2nd metatarsal head. The proximal and middle phalanges of the 2nd toe appear fused. Ligaments Suboptimally assessed by CT. Muscles and Tendons The ankle tendons appear grossly intact. No focal muscular abnormalities are seen. Soft tissues The soft tissues appear diffusely edematous. No focal fluid collection, foreign body or soft tissue emphysema identified. There is no obvious skin ulceration. IMPRESSION: 1. Nonspecific generalized soft tissue edema, likely cellulitis. Correlate clinically. No focal fluid collection or skin ulceration identified. 2. No acute osseous findings or evidence of osteomyelitis. 3. Postsurgical changes as described. Electronically Signed   By: Richardean Sale M.D.   On: 06/14/2018 17:48   Dg Foot Complete Left  Result Date: 06/15/2018 CLINICAL DATA:  Left foot pain centered at the left second toe. EXAM: LEFT FOOT - COMPLETE 3+ VIEW COMPARISON:  AP and lateral views of the left foot dated Mar 17, 2018 FINDINGS: The patient has undergone bunionectomy. There is also a cortical screw through the head of the second metatarsal. There is mild degenerative change of the first and second MTP joints. There is fusion across  the PIP joint of the second toe. There is contour deformity of the medial aspect of the articular surface of the middle phalanx of the second toe at the DIP joint which could reflect an acute fracture. The third, fourth, and fifth toes are unremarkable. There are degenerative changes  of the tarsometatarsal joints especially laterally. There is mild soft tissue swelling involving the distal phalanx of the second toe. No soft tissue gas collections or abnormal calcifications are observed here. IMPRESSION: Postsurgical changes involving the heads of the first and second metatarsals. Moderate degenerative changes of the first and second MTP joints. Fusion across the PIP joint of the second toe. Possible post traumatic fracture of the distal aspect of the middle phalanx of the second toe involving the DIP joint. Soft tissue swelling of the distal phalanx of the second toe may be be post traumatic or reflect cellulitis. No objective evidence of osteomyelitis. Electronically Signed   By: David  Martinique M.D.   On: 06/15/2018 09:33   Dg Foot Complete Right  Result Date: 06/14/2018 CLINICAL DATA:  Dorsal right foot pain redness, and swelling for the past 2 weeks. No injury. EXAM: RIGHT FOOT COMPLETE - 3+ VIEW COMPARISON:  Right foot series of June 09, 2018 FINDINGS: The bones are subjectively adequately mineralized. There is swelling of the forefoot. The patient has undergone previous bunionectomy with a pin placed at the junction of the shaft and head of the first metatarsal. A cortical screw is present involving the head of the second metatarsal. The phalanges are intact as are the third through fifth metatarsals. The tarsal bones exhibit no acute abnormalities. There are plantar and Achilles region calcaneal spurs. IMPRESSION: There are no acute bony abnormalities. There is soft tissue swelling over the forefoot. Electronically Signed   By: David  Martinique M.D.   On: 06/14/2018 13:02   Scheduled Meds: . amLODipine  5  mg Oral Daily  . aspirin  81 mg Oral Daily  . busPIRone  7.5 mg Oral BID  . Chlorhexidine Gluconate Cloth  6 each Topical Daily  . ezetimibe  10 mg Oral Daily  . insulin aspart  0-15 Units Subcutaneous TID WC  . insulin aspart  0-5 Units Subcutaneous QHS  . isosorbide mononitrate  60 mg Oral Daily  . labetalol  300 mg Oral BID  . levothyroxine  50 mcg Oral QAC breakfast  . mupirocin ointment  1 application Nasal BID  . pantoprazole  40 mg Oral BID  . potassium chloride SA  20 mEq Oral BID  . prasugrel  10 mg Oral Daily  . rosuvastatin  40 mg Oral QHS   Continuous Infusions: . sodium chloride 10 mL/hr at 06/16/18 0229  . piperacillin-tazobactam (ZOSYN)  IV 3.375 g (06/16/18 0409)  . vancomycin 1,250 mg (06/16/18 0546)    LOS: 2 days   Kerney Elbe, DO [facility] Pager [phone]  If 7PM-7AM, please contact night-coverage [email or web address] Password [facility] 06/16/2018, 7:52 AM

## 2018-06-16 NOTE — Progress Notes (Signed)
VASCULAR LAB PRELIMINARY  ARTERIAL  ABI completed:    RIGHT    LEFT    PRESSURE WAVEFORM  PRESSURE WAVEFORM  BRACHIAL 179 Triphasic BRACHIAL 191 1.07  DP 199 Biphasic DP 191 Biphasic  AT   AT    PT 139 Biphasic PT 1164 Monophasic  PER   PER    GREAT TOE 37 NA GREAT TOE 80 NA    RIGHT LEFT  ABI 1.11 1.07  TBI 0.21 0.45   Bilateral ABIs are within normal limits at rest. Bilateral TBIs are abnormal at rest.  06/16/2018 10:00 AM Maudry Mayhew, BS, RVT, RDCS, RDMS

## 2018-06-16 NOTE — Progress Notes (Signed)
Intraoperative findings: There was quite a bit of purulence along the dorsal 5th metatarsal head and tracking proximally and laterally over toward the 3rd metatarsal head. Wound culture obtained. Bone sent for pathology and microbiology. Minimal bleeding during the procedure. At the conclusion of the procedure she was brought to PACU with vital signs stable and vascular status intact with an immediate capillary refill time to the toes.   No tourniquet was utilized. Lidocaine with epi was utilized and a local block was done proximal 5th metatarsal .   Recommend to stay inpatient until at least Friday. Will change the bandage tomorrow (Thursday) evening. Awaiting culture results. NWB. Will need home health for dressing changes for saline wet to dry packing to the wound followed by a dry sterile dressing.    Will continue to follow.   Celesta Gentile, DPM O: [phone] C: [phone]

## 2018-06-16 NOTE — Anesthesia Preprocedure Evaluation (Addendum)
Anesthesia Evaluation  Patient identified by MRN, date of birth, ID band Patient awake    Reviewed: Allergy & Precautions, H&P , NPO status , Patient's Chart, lab work & pertinent test results  Airway Mallampati: II  TM Distance: >3 FB Neck ROM: Full    Dental  (+) Dental Advisory Given, Edentulous Upper, Partial Lower   Pulmonary    Pulmonary exam normal breath sounds clear to auscultation       Cardiovascular hypertension, Pt. on medications + CAD, + Past MI and + Peripheral Vascular Disease  (-) CHF Normal cardiovascular exam Rhythm:Regular Rate:Normal  Echo 19 Study Conclusions  - Left ventricle: Wall thickness was increased in a pattern of   moderate LVH. Systolic function was mildly reduced. The estimated   ejection fraction was in the range of 45% to 50%. Features are   consistent with a pseudonormal left ventricular filling pattern,   with concomitant abnormal relaxation and increased filling   pressure (grade 2 diastolic dysfunction). - Aortic valve: There was mild to moderate regurgitation. - Mitral valve: There was mild to moderate regurgitation. - Left atrium: The atrium was mildly dilated. - Pulmonary arteries: PA peak pressure: 37 mm Hg (S).   Neuro/Psych PSYCHIATRIC DISORDERS Diabetic neuropathy  Neuromuscular disease    GI/Hepatic   Endo/Other  diabetes, Poorly Controlled, Type 2, Insulin DependentHypothyroidism   Renal/GU      Musculoskeletal  (+) Arthritis , Osteoarthritis,    Abdominal (+) + obese,   Peds  Hematology   Anesthesia Other Findings   Reproductive/Obstetrics                            Anesthesia Physical  Anesthesia Plan  ASA: III and emergent  Anesthesia Plan: General   Post-op Pain Management:    Induction: Intravenous  PONV Risk Score and Plan: 2 and Ondansetron and Treatment may vary due to age or medical condition  Airway Management  Planned: LMA and Oral ETT  Additional Equipment:   Intra-op Plan:   Post-operative Plan:   Informed Consent: I have reviewed the patients History and Physical, chart, labs and discussed the procedure including the risks, benefits and alternatives for the proposed anesthesia with the patient or authorized representative who has indicated his/her understanding and acceptance.   Dental advisory given  Plan Discussed with: CRNA, Anesthesiologist and Surgeon  Anesthesia Plan Comments: ( )      Anesthesia Quick Evaluation

## 2018-06-17 ENCOUNTER — Encounter (HOSPITAL_COMMUNITY): Payer: Self-pay | Admitting: Podiatry

## 2018-06-17 LAB — COMPREHENSIVE METABOLIC PANEL
ALBUMIN: 2.9 g/dL — AB (ref 3.5–5.0)
ALK PHOS: 63 U/L (ref 38–126)
ALT: 11 U/L (ref 0–44)
ANION GAP: 8 (ref 5–15)
AST: 11 U/L — ABNORMAL LOW (ref 15–41)
BUN: 16 mg/dL (ref 8–23)
CALCIUM: 8.5 mg/dL — AB (ref 8.9–10.3)
CO2: 23 mmol/L (ref 22–32)
Chloride: 109 mmol/L (ref 98–111)
Creatinine, Ser: 0.94 mg/dL (ref 0.44–1.00)
GFR calc Af Amer: >60 mL/min (ref >60)
GFR calc non Af Amer: 57 mL/min — ABNORMAL LOW (ref >60)
GLUCOSE: 112 mg/dL — AB (ref 70–99)
Potassium: 4.2 mmol/L (ref 3.5–5.1)
SODIUM: 140 mmol/L (ref 135–145)
Total Bilirubin: 0.7 mg/dL (ref 0.3–1.2)
Total Protein: 6.6 g/dL (ref 6.5–8.1)

## 2018-06-17 LAB — CBC WITH DIFFERENTIAL/PLATELET
BASOS ABS: 0 10*3/uL (ref 0.0–0.1)
BASOS PCT: 0 %
EOS ABS: 0.1 10*3/uL (ref 0.0–0.7)
Eosinophils Relative: 2 %
HCT: 31.6 % — ABNORMAL LOW (ref 36.0–46.0)
HEMOGLOBIN: 10.4 g/dL — AB (ref 12.0–15.0)
LYMPHS ABS: 1 10*3/uL (ref 0.7–4.0)
Lymphocytes Relative: 14 %
MCH: 29.4 pg (ref 26.0–34.0)
MCHC: 32.9 g/dL (ref 30.0–36.0)
MCV: 89.3 fL (ref 78.0–100.0)
MONO ABS: 0.9 10*3/uL (ref 0.1–1.0)
MONOS PCT: 13 %
NEUTROS ABS: 5 10*3/uL (ref 1.7–7.7)
NEUTROS PCT: 71 %
Platelets: 162 10*3/uL (ref 150–400)
RBC: 3.54 MIL/uL — AB (ref 3.87–5.11)
RDW: 14.3 % (ref 11.5–15.5)
WBC: 7.1 10*3/uL (ref 4.0–10.5)

## 2018-06-17 LAB — GLUCOSE, CAPILLARY
GLUCOSE-CAPILLARY: 232 mg/dL — AB (ref 70–99)
Glucose-Capillary: 109 mg/dL — ABNORMAL HIGH (ref 70–99)
Glucose-Capillary: 171 mg/dL — ABNORMAL HIGH (ref 70–99)
Glucose-Capillary: 188 mg/dL — ABNORMAL HIGH (ref 70–99)

## 2018-06-17 LAB — MAGNESIUM: Magnesium: 1.9 mg/dL (ref 1.7–2.4)

## 2018-06-17 LAB — PHOSPHORUS: Phosphorus: 2.8 mg/dL (ref 2.5–4.6)

## 2018-06-17 MED ORDER — FENTANYL CITRATE (PF) 100 MCG/2ML IJ SOLN
25.0000 ug | INTRAMUSCULAR | Status: DC | PRN
  Administered 2018-06-17: 25 ug via INTRAVENOUS
  Filled 2018-06-17: qty 2

## 2018-06-17 MED ORDER — OXYCODONE HCL 5 MG PO TABS
5.0000 mg | ORAL_TABLET | Freq: Four times a day (QID) | ORAL | Status: DC | PRN
  Administered 2018-06-17 – 2018-06-19 (×4): 5 mg via ORAL
  Filled 2018-06-17 (×4): qty 1

## 2018-06-17 NOTE — Progress Notes (Signed)
Subjective: POD #1 s/p right foot I&D, wound excision and 5th metatarsal head excision.  She states that she is having pain overnight her pain is controlled and she actually states her pain is better now than what it was compared to prior to surgery.  Overall she feels well.  She is been to keep her foot elevated.Denie s any systemic complaints such as fevers, chills, nausea, vomiting.  Denies any calf pain, chest pain, shortness of breath.  She has no other concerns.  Objective: AAO x3, NAD DP/PT pulses palpable bilaterally, CRT less than 3 seconds Skin is intact and there is some bloody drainage present to the plantar aspect of the bandage.  Upon removal of the bandage I remove the packing as well.  There is decrease edema and erythema to the dorsal aspect of the foot there is decreased tenderness palpation.  There is no areas of fluctuation or crepitation.  The wound on the plantar aspect the right foot submetatarsal 5 was identified and there is no purulence identified only small amount of bloody drainage was identified today.  There is no erythema to the plantar aspect of foot although there is mild swelling.  Mild tenderness palpation of the surgical site.  Overall the cellulitis appears to be improving.  There is no ascending cellulitis.  The cellulitis is decreased compared to the areas that were outlined prior to surgery.  No pain with calf compression, swelling, warmth, erythema  Assessment: Status post right foot surgery with improved cellulitis  Plan: -All treatment options discussed with the patient including all alternatives, risks, complications.  -I think the pain is today.  There is no purulence and there is decrease edema erythema.  Overall she is doing much better.  I flushed the wound with saline I applied a saline soaked 4 x 4 into the wound followed by dry sterile dressing.  She is to continue daily dressing changes at home once discharged. -Nonweightbearing.  Cam boot ordered.   Continue physical therapy for nonweightbearing.  Will contact advanced home care to see if I can get her a knee scooter.  -Continue vancomycin, Zosyn.  Awaiting cultures.  -Likely discharge Friday afternoon pending dressing change.  Although to get some culture results will discharge her on oral antibiotics.  I will also follow the wound cultures as an outpatient. -Elevation -She has pain medication ordered -We will continue to follow while inpatient.  I will plan to see her tomorrow for dressing change.  I did show her daughter-in-law today how to change the dressing at home in case home health care cannot be arranged.  They are very concerned with the cost of this.  Celesta Gentile, DPM O: [phone] C: [phone]

## 2018-06-17 NOTE — Evaluation (Signed)
Physical Therapy Evaluation Patient Details Name: ARMINDA FOGLIO MRN: [id number] DOB: 1941/02/11 Today's Date: 06/17/2018   History of Present Illness  77 yo female admitted with R foot wound infection/cellulitis. S/P I&D, 5th met head excision R foot 7/31. Hx of DM, NSTEMI, OA  Clinical Impression  On eval, pt required Mod assist to stand with a RW. Pt was able to stand for ~15-20 seconds before needing to sit down. Deferred any further mobility due to CAM boot not available in room at time of eval. She did well maintaining NWB status. Moderate pain with activity. Will continue to follow and progress activity as tolerated. Discussed d/c plan-pt plans to return home. Recommend HHPT f/u.     Follow Up Recommendations Home health PT    Equipment Recommendations  3in1 ;Rolling walker with 5" wheels (continuing to assess... )    Recommendations for Other Services OT consult     Precautions / Restrictions Precautions Precautions: Fall Required Braces or Orthoses: Other Brace/Splint Other Brace/Splint: CAM boot  Restrictions Weight Bearing Restrictions: Yes RLE Weight Bearing: Non weight bearing Other Position/Activity Restrictions: CAM boot at all times per podiatry op note      Mobility  Bed Mobility Overal bed mobility: Needs Assistance Bed Mobility: Supine to Sit;Sit to Supine     Supine to sit: Supervision;HOB elevated Sit to supine: Supervision   General bed mobility comments: for safety, lines.   Transfers Overall transfer level: Needs assistance Equipment used: Rolling walker (2 wheeled) Transfers: Sit to/from Stand Sit to Stand: From elevated surface;Mod assist         General transfer comment: Assist to rise, stabilize, control descent. VCs safety, technique, hand/LE placement. Therapist guarded R foot with leg to prevent WBing. Pt able to maintain NWB in standing. Fatigues fairly easily.   Ambulation/Gait             General Gait Details: NT-did not  attempt on today due to CAM boot not present in room at time of eval.   Stairs            Wheelchair Mobility    Modified Rankin (Stroke Patients Only)       Balance Overall balance assessment: Needs assistance         Standing balance support: Bilateral upper extremity supported Standing balance-Leahy Scale: Poor                               Pertinent Vitals/Pain Pain Assessment: 0-10 Pain Score: 5  Pain Location: R foot Pain Descriptors / Indicators: Aching;Sore Pain Intervention(s): Limited activity within patient's tolerance;Monitored during session;Repositioned    Home Living Family/patient expects to be discharged to:: Private residence Living Arrangements: Spouse/significant other(handicap son) Available Help at Discharge: Family Type of Home: House Home Access: Ramped entrance     Home Layout: One level Home Equipment: Kasandra Knudsen - single point Additional Comments: borrowed rollator    Prior Function Level of Independence: Independent               Hand Dominance        Extremity/Trunk Assessment   Upper Extremity Assessment Upper Extremity Assessment: Defer to OT evaluation    Lower Extremity Assessment Lower Extremity Assessment: Generalized weakness;RLE deficits/detail RLE Deficits / Details: dressing R foot. able to SLR    Cervical / Trunk Assessment Cervical / Trunk Assessment: Normal  Communication   Communication: No difficulties  Cognition Arousal/Alertness: Awake/alert Behavior During Therapy: [facility] for  tasks assessed/performed Overall Cognitive Status: Within Functional Limits for tasks assessed                                        General Comments      Exercises     Assessment/Plan    PT Assessment Patient needs continued PT services  PT Problem List Decreased balance;Decreased activity tolerance;Decreased mobility;Decreased knowledge of use of DME;Pain       PT Treatment  Interventions DME instruction;Gait training;Functional mobility training;Therapeutic activities;Balance training;Patient/family education;Therapeutic exercise    PT Goals (Current goals can be found in the Care Plan section)  Acute Rehab PT Goals Patient Stated Goal: home. get better.  PT Goal Formulation: With patient Time For Goal Achievement: 07/01/18 Potential to Achieve Goals: Good    Frequency Min 3X/week   Barriers to discharge        Co-evaluation               AM-PAC PT "6 Clicks" Daily Activity  Outcome Measure Difficulty turning over in bed (including adjusting bedclothes, sheets and blankets)?: None Difficulty moving from lying on back to sitting on the side of the bed? : A Little Difficulty sitting down on and standing up from a chair with arms (e.g., wheelchair, bedside commode, etc,.)?: Unable Help needed moving to and from a bed to chair (including a wheelchair)?: A Lot Help needed walking in hospital room?: Total Help needed climbing 3-5 steps with a railing? : Total 6 Click Score: 12    End of Session   Activity Tolerance: Patient limited by fatigue;Patient limited by pain Patient left: in bed;with call bell/phone within reach;with bed alarm set   PT Visit Diagnosis: Other abnormalities of gait and mobility (R26.89);Pain;Difficulty in walking, not elsewhere classified (R26.2) Pain - Right/Left: Right Pain - part of body: Ankle and joints of foot    Time: 1595-3967 PT Time Calculation (min) (ACUTE ONLY): 23 min   Charges:   PT Evaluation $PT Eval Moderate Complexity: 1 Mod            Weston Anna, MPT Pager: [phone]

## 2018-06-17 NOTE — Progress Notes (Signed)
PROGRESS NOTE    [name]  OZH:[id number] DOB: Dec 13, 1940 DOA: 06/14/2018 PCP: [name], PA-C   Brief Narrative:   Sara Graves is a 77 y.o. year old female with medical history significant for CAD status post NSTEMI (DES on 04/2017, now on aspirin and Prasugrel), systolic and Diastolic CHF, HTN, type 2 diabetes, Spinal stimulator who presented on 06/14/2018 as directed by her podiatrist who was concern for developing right foot cellulitis not responding to oral antibiotics.  Her podiatrist [name] saw her on 06/14/2018 for assessment of chronic right foot ulcer. She has had subjective fevers and chills with worsening pain and swelling since this weekend.  She was previously on Keflex.  In the ED she was found to have T-max of 101.9, normal leukocytes.  Blood cultures were obtained.  X-ray right foot showed soft tissue swelling.  CT right foot show generalized soft tissue edema, likely cellulitis with no evidence of osteomyelitis.  She was empirically started on IV Zosyn and Vancomycin. Podiatry evaluated patient on 7/29 and while in hospital discussed possible fifth metatarsal head resection with the patient given chronic ulceration and infection although CT was negative for osteomyelitis. Patient underwent Metatarsal head resection 06/16/18 and per [name] report there was quite a bit of purulence along the dorsal 5th metatarsal head. Will await Cx Sensitivities prior to de-escalating Abx and Podiatry recommending NWB. Dressing to be changed today   Assessment & Plan:   Principal Problem:   Wound infection Active Problems:   Hypothyroidism   DM (diabetes mellitus), type 2, uncontrolled ([place])   Essential hypertension   NSTEMI (non-ST elevated myocardial infarction) ([place])   Cellulitis of right foot   Cellulitis of left toe  Right foot cellulitis with chronic ulceration and left toe cellulitis(2nd toe, distal phalanx) s/p Right 5th Metatarsal Excision  -X-ray and CT of  right foot (MRI contraindicated in setting of spinal stimulator)shows soft tissue swelling with no signs of osteomyelitis -Left foot x-ray shows soft tissue swelling of distal phalanx of second toe, no evidence of osteomyelitis -Monitor blood cultures; Showed NGTD at 2 days still  -Podiatry [name] consulted, planning on fifth metatarsal head resection on 7/31 -X-ray of left foot (given left second toe cellulitis) showed Postsurgical changes involving the heads of the first and second metatarsals. Moderate degenerative changes of the first and second MTP joints. Fusion across the PIP joint of the second toe. Possible post traumatic fracture of the distal aspect of the middle phalanx of the second toe involving the DIP joint. Soft tissue swelling of the distal phalanx of the second toe may be be post traumatic or reflect cellulitis. No objective evidence of osteomyelitis. -Febrile on admission but otherwise no signs of sepsis -Continue Empiric IV Vancomycin and Zosyn for now and Podiatry to check Wound Cx during surgery  -Cardiology cleared for surgery, will need to continue her DAPT  -Checked Non-invasive arterial studies and as below were normal  -Podiatry recommending Non-Weightbearing postoperatively and recommend CAM boot after surgery -Underwent Right 5th Metatarsal Excision yesterday and Wound Cultures obtained. Aerobic/Anaerobic Cx sent and Gram Stain showed Rare WBC, Predominantly PMN and Bone Cx Sent and Gram Stain showed Rare WBC's Present, Predominantly PMN -Continue to Follow C/x's  -WBC is now 7.1 -Pain Control: Acetaminophen 650 mg po q6hprn Mild Pain/Fever, Oxycodone 5 mg po q6hprn Moderate Pain and 25 mcg IV Fentanyl q2hprn Severe Pain  -PT/OT Evaluate and Treat  CAD s/p NSTEMI (04/2018) -On Aspirin 81 mg po  Daily and Prasugrel 10 mg po Daily since recent NSTEMI (previously on aspirin and plavix) -[name] spoke with [name] [name] cardiology) yesterday and  patient okay for surgery, should continue DAPT -Remains asymptomatic currently  CKD stage III, stable -Stable and at Baseline -BUN/Cr was 26/1.05 and now improved to 16/0.94 -Continue to Monitor and Repeat CMP in AM   Type 2 Diabetes -Last A1c on 03/17/2016 -Repeat A1c here was 8.9 -Started Moderate Novolog SSI AC/HS; (at home uses Humalog with meals via V-GO insulin delivery device) -Continue to Monitor CBG's; CBG's ranging from 98-171  Hypertension -BP was slightly elevated at 157/41 this AM -Continue home Amlodipine 5 mg po Daily, Isosorbide Mononitrate 60 mg po Daily, and Labetalol 300 mg po BID   Anxiety, stable -C/w Home Buspirone 7.5 mg po BID   Hypothyroidism, stable -Continue Levothyroxine 50 mcg po Daily   Hyperlipidemia, stable -Continue Rosuvastatin 40 mg po qHS and Ezetimbie 10 mg po daily   GERD, stable -Continue Pantoprazole 40 mg po BID  DVT prophylaxis: Enoxaparin 40 mg sq q24h Code Status: FULL CODE Family Communication: No family present at bedside  Disposition Plan: Pending PT Evaluation and Podiatry Clearance   Consultants:   Podiatry [name]   Procedures:  ARTERIAL  ABI completed:    RIGHT    LEFT    PRESSURE WAVEFORM  PRESSURE WAVEFORM  BRACHIAL 179 Triphasic BRACHIAL 191 1.07  DP 199 Biphasic DP 191 Biphasic  AT   AT    PT 139 Biphasic PT 1164 Monophasic  PER   PER    GREAT TOE 37 NA GREAT TOE 80 NA    RIGHT LEFT  ABI 1.11 1.07  TBI 0.21 0.45   Bilateral ABIs are within normal limits at rest. Bilateral TBIs are abnormal at rest.   Antimicrobials:  Anti-infectives (From admission, onward)   Start     Dose/Rate Route Frequency Ordered Stop   06/16/18 0600  vancomycin (VANCOCIN) 1,250 mg in sodium chloride 0.9 % 250 mL IVPB     1,250 mg 166.7 mL/hr over 90 Minutes Intravenous Every 36 hours 06/14/18 1911     06/14/18 2000  piperacillin-tazobactam (ZOSYN) IVPB 3.375 g     3.375 g 12.5  mL/hr over 240 Minutes Intravenous Every 8 hours 06/14/18 1911     06/14/18 1445  vancomycin (VANCOCIN) 1,500 mg in sodium chloride 0.9 % 500 mL IVPB     1,500 mg 250 mL/hr over 120 Minutes Intravenous STAT 06/14/18 1434 06/14/18 1820   06/14/18 1130  piperacillin-tazobactam (ZOSYN) IVPB 3.375 g     3.375 g 100 mL/hr over 30 Minutes Intravenous  Once 06/14/18 1116 06/14/18 1435     Subjective: Seen and examined at bedside and stated she had a "rough night with pain." No CP or SOB but states it was hurting yesterday and did not get very much rest because of it.  No lightheadedness or dizziness.  No other complaints or concerns at this time and states she felt slightly nauseous yesterday and did not want to eat but feels okay today.  Objective: Vitals:   06/16/18 2218 06/16/18 2316 06/17/18 0136 06/17/18 0515  BP: (!) 158/78 (!) 139/43 (!) 142/52 (!) 157/41  Pulse: 73 73 66 (!) 55  Resp: 15 16 17 12   Temp: 98.8 F (37.1 C) 98.3 F (36.8 C) 99 F (37.2 C) 98.3 F (36.8 C)  TempSrc: Oral Oral Oral Oral  SpO2: 98% 93% 91% 91%  Weight:  Height:        Intake/Output Summary (Last 24 hours) at 06/17/2018 1235 Last data filed at 06/17/2018 1000 Gross per 24 hour  Intake 1351.25 ml  Output 1150 ml  Net 201.25 ml   Filed Weights   06/14/18 1040  Weight: 88.5 kg (195 lb)   Examination: Physical Exam:  Constitutional: Well-nourished, well-developed obese Caucasian female currently in no acute distress peers, comfortable Eyes: Sclera are anicteric.  Lids and conjunctive are normal ENMT: External ears and nose appear normal.  Grossly normal hearing.  Mucous members are moist  Neck: Appears supple with no JVD Respiratory: Diminished to auscultation bilaterally with no appreciable wheezing, rales, rhonchi.  Patient not tachypneic using accessory muscle breathe Cardiovascular: Regular rate and rhythm but slightly bradycardic.  Has a slight murmur appreciated.  Trace lower extremity  edema Abdomen: Soft, nontender, distended secondary body habitus.  Bowel sounds present in all 4 quadrants. GU: Deferred Musculoskeletal: No contractures or cyanosis.  Skin: Left second toe is not as erythematous but is still mildly swollen.  Right foot was wrapped in Ace bandage and did have some leaking from where her metatarsal head was removed.  Skin warm and dry. Neurologic: Cranial nerves II through XII grossly intact no appreciable focal deficits Psychiatric: Normal mood and affect.  Intact judgment insight.  Patient is awake and alert and oriented x3  Data Reviewed: I have personally reviewed following labs and imaging studies  CBC: Recent Labs  Lab 06/14/18 1155 06/15/18 0416 06/16/18 0422 06/17/18 0426  WBC 7.3 7.0 6.1 7.1  NEUTROABS 5.7  --  4.1 5.0  HGB 11.4* 10.0* 10.1* 10.4*  HCT 34.1* 30.1* 29.8* 31.6*  MCV 88.6 89.9 90.9 89.3  PLT 128* 127* 139* 637   Basic Metabolic Panel: Recent Labs  Lab 06/14/18 1155 06/15/18 0416 06/16/18 0422 06/17/18 0426  NA 138 138 141 140  K 3.8 3.6 4.1 4.2  CL 105 109 111 109  CO2 22 23 23 23   GLUCOSE 160* 129* 167* 112*  BUN 26* 25* 26* 16  CREATININE 1.03* 1.01* 1.05*  1.05* 0.94  CALCIUM 9.0 8.5* 8.5* 8.5*  MG  --   --  2.1 1.9  PHOS  --   --  2.7 2.8   GFR: Estimated Creatinine Clearance: 55.1 mL/min (by C-G formula based on SCr of 0.94 mg/dL). Liver Function Tests: Recent Labs  Lab 06/16/18 0422 06/17/18 0426  AST 11* 11*  ALT 9 11  ALKPHOS 61 63  BILITOT 0.5 0.7  PROT 6.2* 6.6  ALBUMIN 2.8* 2.9*   No results for input(s): LIPASE, AMYLASE in the last 168 hours. No results for input(s): AMMONIA in the last 168 hours. Coagulation Profile: No results for input(s): INR, PROTIME in the last 168 hours. Cardiac Enzymes: No results for input(s): CKTOTAL, CKMB, CKMBINDEX, TROPONINI in the last 168 hours. BNP (last 3 results) No results for input(s): PROBNP in the last 8760 hours. HbA1C: Recent Labs     06/15/18 0416  HGBA1C 8.9*   CBG: Recent Labs  Lab 06/16/18 1721 06/16/18 1931 06/16/18 2216 06/17/18 0804 06/17/18 1159  GLUCAP 103* 98 103* 109* 171*   Lipid Profile: No results for input(s): CHOL, HDL, LDLCALC, TRIG, CHOLHDL, LDLDIRECT in the last 72 hours. Thyroid Function Tests: No results for input(s): TSH, T4TOTAL, FREET4, T3FREE, THYROIDAB in the last 72 hours. Anemia Panel: No results for input(s): VITAMINB12, FOLATE, FERRITIN, TIBC, IRON, RETICCTPCT in the last 72 hours. Sepsis Labs: Recent Labs  Lab 06/14/18 1204  LATICACIDVEN 0.93    Recent Results (from the past 240 hour(s))  Culture, blood (Routine X 2) w Reflex to ID Panel     Status: None (Preliminary result)   Collection Time: 06/14/18 11:55 AM  Result Value Ref Range Status   Specimen Description   Final    BLOOD RIGHT WRIST Performed at [facility] [address], [place], [place] [address]    Special Requests   Final    BOTTLES DRAWN AEROBIC AND ANAEROBIC Blood Culture adequate volume Performed at [facility] [address] [place], [place] [address]    Culture   Final    NO GROWTH 2 DAYS Performed at [facility] [address], [place], [place] [address]    Report Status PENDING  Incomplete  Culture, blood (Routine X 2) w Reflex to ID Panel     Status: None (Preliminary result)   Collection Time: 06/14/18 11:55 AM  Result Value Ref Range Status   Specimen Description   Final    BLOOD FOREARM Performed at [facility] [address] [place], [place] [address]    Special Requests   Final    BOTTLES DRAWN AEROBIC AND ANAEROBIC Blood Culture adequate volume Performed at [facility] [address] [place], [place] [address]    Culture   Final    NO GROWTH 2 DAYS Performed at [facility] [address], [place], [place] [address]    Report Status PENDING  Incomplete  Surgical pcr screen      Status: Abnormal   Collection Time: 06/15/18 10:24 PM  Result Value Ref Range Status   MRSA, PCR NEGATIVE NEGATIVE Final   Staphylococcus aureus POSITIVE (A) NEGATIVE Final    Comment: (NOTE) The Xpert SA Assay (FDA approved for NASAL specimens in patients 23 years of age and older), is one component of a comprehensive surveillance program. It is not intended to diagnose infection nor to guide or monitor treatment. Performed at [facility], [place] [address], [place], [place] [address]   Aerobic/Anaerobic Culture (surgical/deep wound)     Status: None (Preliminary result)   Collection Time: 06/16/18  6:29 PM  Result Value Ref Range Status   Specimen Description   Final    ABSCESS Performed at [facility] [address] [place], [place] [address]    Special Requests   Final    NONE Performed at [facility], [place] [address], [place], [place] [address]    Gram Stain   Final    RARE WBC PRESENT, PREDOMINANTLY PMN NO ORGANISMS SEEN Performed at [facility], [place] [address] [place], [place] [address]    Culture PENDING  Incomplete   Report Status PENDING  Incomplete  Aerobic/Anaerobic Culture (surgical/deep wound)     Status: None (Preliminary result)   Collection Time: 06/16/18  6:53 PM  Result Value Ref Range Status   Specimen Description   Final    BONE Performed at [facility] [address] [place], [place] [address]    Special Requests   Final    METATARSAL HEAD EXCISION Performed at [facility], [place] [address], [place], [place] [address]    Gram Stain   Final    RARE WBC PRESENT, PREDOMINANTLY PMN NO ORGANISMS SEEN Performed at [facility], [place] [address] [place],  [address]    Culture PENDING  Incomplete  Report Status PENDING  Incomplete    Radiology Studies: No results found. Scheduled Meds: . amLODipine  5 mg Oral Daily  . aspirin  81 mg  Oral Daily  . busPIRone  7.5 mg Oral BID  . Chlorhexidine Gluconate Cloth  6 each Topical Daily  . ezetimibe  10 mg Oral Daily  . insulin aspart  0-15 Units Subcutaneous TID WC  . insulin aspart  0-5 Units Subcutaneous QHS  . isosorbide mononitrate  60 mg Oral Daily  . labetalol  300 mg Oral BID  . levothyroxine  50 mcg Oral QAC breakfast  . mupirocin ointment  1 application Nasal BID  . pantoprazole  40 mg Oral BID  . potassium chloride SA  20 mEq Oral BID  . prasugrel  10 mg Oral Daily  . rosuvastatin  40 mg Oral QHS   Continuous Infusions: . sodium chloride 10 mL/hr at 06/16/18 0229  . piperacillin-tazobactam (ZOSYN)  IV 3.375 g (06/17/18 1224)  . vancomycin 1,250 mg (06/16/18 0546)    LOS: 3 days   Kerney Elbe, DO [facility] Pager [phone]  If 7PM-7AM, please contact night-coverage [email or web address] Password [facility] 06/17/2018, 12:35 PM

## 2018-06-17 NOTE — Op Note (Signed)
Surgeon: Celesta Gentile, DPM Assistants: none Pre-operative diagnosis:  Post-operative diagnosis: same Procedure: Right foot abscess, prominent metatarsal head Pathology: Specimen: wound culture x 1, bone culture x 1, bone pathology  Pertinent Intra-op findings: see below Anesthesia: General Hemostasis: Anatomic and with 1% lidocaine with epi (8cc) EBL: <20 cc Materials: saline soaked 4x4 dressing Injectables: 8 cc 1% lidocaine with epi  Indications for surgery: Sara Graves was admitted to the hospital on Monday due to cellulitis of the right foot.  She has had an ulceration on the right foot submetatarsal 5 for quite some time is been under the treatment of [name].  She is having nonhealing of the wound and she started to have some mild erythema she was placed on Keflex.  She presented to the office on Monday with fevers of 102 as well as increased pain and redness to the foot and therefore she was admitted to the hospital.  After speaking with her daughter-in-law she states that she has not been taking her insulin over the last 5 days or her fluid pills.  Her daughter-in-law also states they have been pulling hair out of the wound.  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  Despite being on IV antibiotics the cellulitis appeared to be worsening on Wednesday so therefore elected to go ahead and proceed with surgery including wound debridement as well as fifth metatarsal head excision. All alternatives, risks, complications were discussed with the patient detail. No promises or guarantees were given as to the outcome of the procedure and all questions were answered to best of my ability.   Procedure in detail: The patient was both verbally and visually identified by myself, the nursing staff, and anesthesia staff in the preoperative holding area. They were then transferred to the operating room and placed on the operative table in supine position.  A well-padded ankle tourniquet was  applied however the tourniquet was not inflated during the procedure.  The right lower extremities and scrubbed, prepped, draped in normal sterile fashion.    Given her history of blood thinners which we were unable to stop prior to surgery I did inject a total of 8 cc of 1% lidocaine with epinephrine proximal to fifth metatarsal area for hemostasis.  Utilizing a #10 blade scalpel attention was directed to the right foot submetatarsal one on the area of the ulceration which the wound was sharply excised.  This was down to the fifth metatarsal head which was palpable.  Incision vertically along the fifth metatarsal at this time to evaluate the tissue.  At this time I debrided the nonviable tissue the area and this point there was found to be quite a bit of purulence coming from the plantar aspect which was probing toward the dorsal portion of the foot.  Because of this I elected to proceed with a secondary incision on the dorsal lateral aspect the fifth metatarsal head.  Incision was made with a #10 blade scalpel.  Incision made to the epidermis and dermis.  Once again the subcutaneous tissues is quite a bit of purulence expressed from the area.  A deep incision was made along the fifth metatarsal and there is found to be a pocket of an abscess in this area and was probing over towards the third metatarsal head.  Wound culture was obtained of the purulence.  There is purulence found to be around the fifth metatarsal tracking proximally as well.  Because of this I elected to go ahead and proceed with  the fifth metatarsal excision as well as removal of the bone in which there was purulence around.  A sagittal bone saw was utilized to resect the bone.  The bone was sent for pathology and the proximal aspect of the bone was sent for culture.  I debrided all soft tissue that was nonviable and healthy, bleeding tissue.  At this time a hemostat was utilized to further decompress the area and there is no further  purulence or signs of an abscess identified. I inspected the area and was able to visualize actually transtarsal which should be very hard in nature and white in color.  Of note there was a small cut of the 4th metatarsal but this was along the lateral cortex only and I stressed the area and there was no instability present.  Fluoroscopy was utilized to confirm adequate resection of the fifth metatarsal. No fracture identified.  Incision was copiously irrigated with sterile saline.  Hemostasis was achieved.  Then closed the dorsal incision with 3-0 Prolene.  Also the incision on the plantar aspect the foot was closed partially with 3-0 Prolene and off the area the previous wound opened.  I did pack this year with a 4 x 4 saline wet-to-dry gauze.  Adaptic was placed over the incisions follow-up a dry sterile dressing.  She was awoke from anesthesia and found to tolerate the procedure well any complications.  She was transferred to PACU for signs stable and vascular status intact.  I reviewed the intraoperative findings with the patient's daughter-in-law and her pastor at her request prior to surgery.  Given the fifth metatarsal was resected she may need to have 1/5 toe amputation program to hold off on that.  We will have to watch the area very closely and await cultures.  I want to keep her in the inpatient for now await wound cultures and she is going to need home health care to come help her change her dressing daily at home.  This will be a saline wet-to-dry dressing for now packed into the wound.  Recommend nonweightbearing the cam boot at all times.  Discussed risk of amputation or spread of infection again with the family today.   Celesta Gentile, DPM

## 2018-06-17 NOTE — Care Management Important Message (Signed)
Important Message  Patient Details  Name: Sara Graves MRN: [id number] Date of Birth: 31-Aug-1941   Medicare Important Message Given:  Yes    Kerin Salen 06/17/2018, 10:53 AMImportant Message  Patient Details  Name: Sara Graves MRN: [id number] Date of Birth: 12-04-1940   Medicare Important Message Given:  Yes    Kerin Salen 06/17/2018, 10:53 AM

## 2018-06-17 NOTE — Anesthesia Postprocedure Evaluation (Signed)
Anesthesia Post Note  Patient: LOVENE MARET  Procedure(s) Performed: right 5th metatarsal excision, a mini c-arm (Right )     Patient location during evaluation: PACU Anesthesia Type: MAC Level of consciousness: awake and alert Pain management: pain level controlled Vital Signs Assessment: post-procedure vital signs reviewed and stable Respiratory status: spontaneous breathing, nonlabored ventilation, respiratory function stable and patient connected to nasal cannula oxygen Cardiovascular status: stable and blood pressure returned to baseline Postop Assessment: no apparent nausea or vomiting Anesthetic complications: no    Last Vitals:  Vitals:   06/17/18 0515 06/17/18 1331  BP: (!) 157/41 (!) 123/34  Pulse: (!) 55 (!) 48  Resp: 12 18  Temp: 36.8 C   SpO2: 91% 96%    Last Pain:  Vitals:   06/17/18 2210  TempSrc:   PainSc: 5                  Waynesha Rammel

## 2018-06-17 NOTE — Anesthesia Postprocedure Evaluation (Signed)
Anesthesia Post Note  Patient: Sara Graves  Procedure(s) Performed: right 5th metatarsal excision, a mini c-arm (Right )     Patient location during evaluation: PACU Anesthesia Type: MAC Level of consciousness: awake and alert Pain management: pain level controlled Vital Signs Assessment: post-procedure vital signs reviewed and stable Respiratory status: spontaneous breathing, nonlabored ventilation, respiratory function stable and patient connected to nasal cannula oxygen Cardiovascular status: stable and blood pressure returned to baseline Postop Assessment: no apparent nausea or vomiting Anesthetic complications: no    Last Vitals:  Vitals:   06/17/18 0515 06/17/18 1331  BP: (!) 157/41 (!) 123/34  Pulse: (!) 55 (!) 48  Resp: 12 18  Temp: 36.8 C   SpO2: 91% 96%    Last Pain:  Vitals:   06/17/18 2210  TempSrc:   PainSc: 5                  Johnmark Geiger

## 2018-06-18 DIAGNOSIS — R42 Dizziness and giddiness: Secondary | ICD-10-CM

## 2018-06-18 LAB — BASIC METABOLIC PANEL
Anion gap: 9 (ref 5–15)
BUN: 24 mg/dL — ABNORMAL HIGH (ref 8–23)
CO2: 25 mmol/L (ref 22–32)
CREATININE: 1.29 mg/dL — AB (ref 0.44–1.00)
Calcium: 8.7 mg/dL — ABNORMAL LOW (ref 8.9–10.3)
Chloride: 105 mmol/L (ref 98–111)
GFR calc non Af Amer: 39 mL/min — ABNORMAL LOW (ref >60)
GFR, EST AFRICAN AMERICAN: 45 mL/min — AB (ref >60)
Glucose, Bld: 291 mg/dL — ABNORMAL HIGH (ref 70–99)
POTASSIUM: 5 mmol/L (ref 3.5–5.1)
Sodium: 139 mmol/L (ref 135–145)

## 2018-06-18 LAB — CBC WITH DIFFERENTIAL/PLATELET
BASOS ABS: 0 10*3/uL (ref 0.0–0.1)
BASOS PCT: 0 %
EOS ABS: 0.2 10*3/uL (ref 0.0–0.7)
Eosinophils Relative: 3 %
HEMATOCRIT: 29.5 % — AB (ref 36.0–46.0)
HEMOGLOBIN: 9.5 g/dL — AB (ref 12.0–15.0)
Lymphocytes Relative: 28 %
Lymphs Abs: 1.7 10*3/uL (ref 0.7–4.0)
MCH: 29.5 pg (ref 26.0–34.0)
MCHC: 32.2 g/dL (ref 30.0–36.0)
MCV: 91.6 fL (ref 78.0–100.0)
MONOS PCT: 9 %
Monocytes Absolute: 0.5 10*3/uL (ref 0.1–1.0)
NEUTROS ABS: 3.7 10*3/uL (ref 1.7–7.7)
NEUTROS PCT: 60 %
Platelets: 158 10*3/uL (ref 150–400)
RBC: 3.22 MIL/uL — AB (ref 3.87–5.11)
RDW: 14.5 % (ref 11.5–15.5)
WBC: 6.1 10*3/uL (ref 4.0–10.5)

## 2018-06-18 LAB — COMPREHENSIVE METABOLIC PANEL
ALBUMIN: 2.6 g/dL — AB (ref 3.5–5.0)
ALK PHOS: 55 U/L (ref 38–126)
ALT: 11 U/L (ref 0–44)
AST: 10 U/L — ABNORMAL LOW (ref 15–41)
Anion gap: 6 (ref 5–15)
BILIRUBIN TOTAL: 0.7 mg/dL (ref 0.3–1.2)
BUN: 20 mg/dL (ref 8–23)
CALCIUM: 8.5 mg/dL — AB (ref 8.9–10.3)
CO2: 25 mmol/L (ref 22–32)
Chloride: 107 mmol/L (ref 98–111)
Creatinine, Ser: 1.16 mg/dL — ABNORMAL HIGH (ref 0.44–1.00)
GFR calc Af Amer: 51 mL/min — ABNORMAL LOW (ref >60)
GFR calc non Af Amer: 44 mL/min — ABNORMAL LOW (ref >60)
GLUCOSE: 205 mg/dL — AB (ref 70–99)
POTASSIUM: 4.5 mmol/L (ref 3.5–5.1)
Sodium: 138 mmol/L (ref 135–145)
TOTAL PROTEIN: 6.3 g/dL — AB (ref 6.5–8.1)

## 2018-06-18 LAB — PHOSPHORUS: Phosphorus: 3 mg/dL (ref 2.5–4.6)

## 2018-06-18 LAB — GLUCOSE, CAPILLARY
GLUCOSE-CAPILLARY: 254 mg/dL — AB (ref 70–99)
GLUCOSE-CAPILLARY: 253 mg/dL — AB (ref 70–99)
Glucose-Capillary: 282 mg/dL — ABNORMAL HIGH (ref 70–99)

## 2018-06-18 LAB — MAGNESIUM: Magnesium: 2 mg/dL (ref 1.7–2.4)

## 2018-06-18 MED ORDER — VANCOMYCIN VARIABLE DOSE PER UNSTABLE RENAL FUNCTION (PHARMACIST DOSING): Status: DC

## 2018-06-18 MED ORDER — HEPARIN SODIUM (PORCINE) 5000 UNIT/ML IJ SOLN
5000.0000 [IU] | Freq: Three times a day (TID) | INTRAMUSCULAR | Status: DC
  Administered 2018-06-18 – 2018-06-19 (×4): 5000 [IU] via SUBCUTANEOUS
  Filled 2018-06-18 (×4): qty 1

## 2018-06-18 MED ORDER — VANCOMYCIN HCL 10 G IV SOLR: 1250.0000 mg | INTRAVENOUS | Status: DC

## 2018-06-18 MED ORDER — SODIUM CHLORIDE 0.9 % IV SOLN
INTRAVENOUS | Status: AC
  Administered 2018-06-18: 12:00:00 via INTRAVENOUS

## 2018-06-18 NOTE — Progress Notes (Signed)
Sara Graves is a 77 y.o. female patient.  1. Diabetic foot infection ([place])   2. Wound check, abscess   3. Wound infection    Past Medical History:  Diagnosis Date  . Anxiety   . Arthritis    "hands" (12/29/2017)  . Basal cell carcinoma of nose    removed  . Bursitis of left shoulder   . Cat scratch fever    Late 90s  . Coronary artery disease   . Depression   . Diabetes mellitus, type II, insulin dependent ([place])   . GERD (gastroesophageal reflux disease)   . H. pylori infection 2008 and 1998   treated  . Hyperlipidemia   . Hypertension   . Hypothyroidism   . Multinodular goiter   . Rotator cuff tear, left recurrent   . Urge urinary incontinence   . UTI (lower urinary tract infection) 05/2016   Current Facility-Administered Medications  Medication Dose Route Frequency Provider Last Rate Last Dose  . 0.9 %  sodium chloride infusion   Intravenous PRN Oretha Milch D, MD 10 mL/hr at 06/16/18 0229    . 0.9 %  sodium chloride infusion   Intravenous Continuous Raiford Noble Conrad, DO 75 mL/hr at 06/18/18 1155    . acetaminophen (TYLENOL) tablet 650 mg  650 mg Oral Q6H PRN Debbe Odea, MD   650 mg at 06/17/18 0340   Or  . acetaminophen (TYLENOL) suppository 650 mg  650 mg Rectal Q6H PRN Debbe Odea, MD      . amLODipine (NORVASC) tablet 5 mg  5 mg Oral Daily Debbe Odea, MD   5 mg at 06/18/18 0821  . aspirin chewable tablet 81 mg  81 mg Oral Daily Debbe Odea, MD   81 mg at 06/18/18 9417  . busPIRone (BUSPAR) tablet 7.5 mg  7.5 mg Oral BID Debbe Odea, MD   7.5 mg at 06/18/18 0820  . Chlorhexidine Gluconate Cloth 2 % PADS 6 each  6 each Topical Daily Desiree Hane, MD   6 each at 06/18/18 [phone]  . ezetimibe (ZETIA) tablet 10 mg  10 mg Oral Daily Debbe Odea, MD   10 mg at 06/18/18 0820  . fentaNYL (SUBLIMAZE) injection 25 mcg  25 mcg Intravenous Q2H PRN Raiford Noble [place], DO   25 mcg at 06/17/18 1044  . heparin injection 5,000 Units  5,000 Units Subcutaneous [address] [place], DO   5,000 Units at 06/18/18 1155  . insulin aspart (novoLOG) injection 0-15 Units  0-15 Units Subcutaneous TID WC Debbe Odea, MD   8 Units at 06/18/18 1157  . insulin aspart (novoLOG) injection 0-5 Units  0-5 Units Subcutaneous QHS Debbe Odea, MD   2 Units at 06/17/18 2222  . isosorbide mononitrate (IMDUR) 24 hr tablet 60 mg  60 mg Oral Daily Debbe Odea, MD   60 mg at 06/18/18 0821  . labetalol (NORMODYNE) tablet 300 mg  300 mg Oral BID Debbe Odea, MD   300 mg at 06/17/18 2210  . levothyroxine (SYNTHROID, LEVOTHROID) tablet 50 mcg  50 mcg Oral QAC breakfast Debbe Odea, MD   50 mcg at 06/18/18 4481  . mupirocin ointment (BACTROBAN) 2 % 1 application  1 application Nasal BID Desiree Hane, MD   1 application at 85/63/14 [phone]  . oxyCODONE (Oxy IR/ROXICODONE) immediate release tablet 5 mg  5 mg Oral Q6H PRN Raiford Noble [place], DO   5 mg at 06/18/18 1533  . pantoprazole (PROTONIX) EC tablet 40 mg  40  mg Oral BID Debbe Odea, MD   40 mg at 06/18/18 3903  . piperacillin-tazobactam (ZOSYN) IVPB 3.375 g  3.375 g Intravenous Q8H Shade, Christine E, RPH 12.5 mL/hr at 06/18/18 1157 3.375 g at 06/18/18 1157  . potassium chloride SA (K-DUR,KLOR-CON) CR tablet 20 mEq  20 mEq Oral BID Debbe Odea, MD   20 mEq at 06/18/18 0092  . prasugrel (EFFIENT) tablet 10 mg  10 mg Oral Daily Debbe Odea, MD   10 mg at 06/18/18 0820  . rosuvastatin (CRESTOR) tablet 40 mg  40 mg Oral QHS Debbe Odea, MD   40 mg at 06/17/18 2210   Allergies  Allergen Reactions  . Rosiglitazone Maleate Anaphylaxis and Swelling  . Morphine Other (See Comments)    SEVERE HYPOTENSION   . Cephalexin Diarrhea  . Sulfa Antibiotics Diarrhea and Nausea And Vomiting  . Elavil [Amitriptyline] Nausea Only  . Tramadol-Acetaminophen Rash   Principal Problem:   Wound infection Active Problems:   Hypothyroidism   DM (diabetes mellitus), type 2, uncontrolled ([place])   Essential hypertension   NSTEMI  (non-ST elevated myocardial infarction) ([place])   Cellulitis of right foot   Cellulitis of left toe   Dizziness  Blood pressure 123/67, pulse 60, temperature (!) 97.4 F (36.3 C), temperature source Oral, resp. rate 18, height 5\' 5"  (1.651 m), weight 195 lb (88.5 kg), SpO2 97 %.  Subjective   POD #2 s/p right foot I&D, wound excision and 5th metatarsal head excision. She states that she has been up more today with PT and sitting in the chair and has had some increase in pain but pain medication is controlling her pain. She overall feels better and she denies any fevers, chills, nausea, vomiting. No calf pain, chest pain, shortness of breath.    Objective  Dermatological: Incision on the dorsal foot is well coapted with sutures intact. Ulcer submetatarsal 5 without any purulence identified today. This wound goes all the way dorsal to the areas where she had the purulence intraoperative. Drainage has decreased and only some bloody drainage on the bandage. The cellulitis is improved compared to pre-operative. It appears to be receding and the area of the bright cellulitis is darker in color. There is decreased edema. No fluctuance or crepitance. No malodor. Mild increase in warmth to the foot but overall improved as well.         Vascular: Dorsalis Pedis artery and Posterior Tibial artery pedal pulses palpable with immedate capillary fill time. P There is no pain with calf compression, swelling, warmth, erythema.   Musculoskeletal: Decreased pain to the surgical site  Assessment & Plan  POD #2 s/p right foot I&D, 5th metatarsal head excision, wound excision -Dressing changed. Wound flushed with saline and packed with part of a saline soaked 4x4 followed by dry dressing.  -Overall it does appear improved compared to surgery. Still some area of bright red/cellulitis but it is improving. Decreased edema to the foot and decreased pain. Continue IV abx for now. Vanc has been d/c. On zosyn.  Awaiting cultures to finalize as they are positive. Will need extended course of antibiotics. Possible ID consult pending cultures.  -NWB. Has walker and bedside commode ready for discharge. Rolling knee scooter requested but not coveraged by insurance so she wants to hold off for now.  -Will change the dressing in the morning.   Trula Slade 06/18/2018  O: [phone] C: [phone]

## 2018-06-18 NOTE — Evaluation (Signed)
Occupational Therapy Evaluation Patient Details Name: Sara Graves MRN: [id number] DOB: 04/10/1941 Today's Date: 06/18/2018    History of Present Illness 77 yo female admitted with R foot wound infection/cellulitis. S/P I&D, 5th met head excision R foot 7/31. Hx of DM, NSTEMI, OA   Clinical Impression   Pt was admitted for the above.  At baseline, she is independent with adls.  Pt needs min A for SPT and up to max A for LB adls.  Will follow in acute setting focusing on toileting.  Family will assist with adls as needed.    Follow Up Recommendations  Supervision/Assistance - 24 hour;Home health OT    Equipment Recommendations  3 in 1 bedside commode    Recommendations for Other Services       Precautions / Restrictions Precautions Precautions: Fall Required Braces or Orthoses: Other Brace/Splint Other Brace/Splint: CAM boot Restrictions RLE Weight Bearing: Non weight bearing Other Position/Activity Restrictions: CAM boot at all times per podiatry op note      Mobility Bed Mobility Overal bed mobility: Needs Assistance Bed Mobility: Supine to Sit     Supine to sit: Supervision;HOB elevated Sit to supine: Min assist   General bed mobility comments: light min A to bring RLE onto bed  Transfers Overall transfer level: Needs assistance Equipment used: Rolling walker (2 wheeled) Transfers: Sit to/from Stand Sit to Stand: Min assist         General transfer comment: assist to rise/steady    Balance                                           ADL either performed or assessed with clinical judgement   ADL Overall ADL's : Needs assistance/impaired             Lower Body Bathing: Minimal assistance;Sit to/from stand       Lower Body Dressing: Maximal assistance;Sit to/from stand   Toilet Transfer: Minimal assistance;Stand-pivot;RW(chair to bed)   Toileting- Clothing Manipulation and Hygiene: Moderate assistance;Sit to/from stand         General ADL Comments: pt has a daughter in law who is a [employer]ar[employer] manager. She will have help for adls as needed. [place] now on foot; she can maintain NWB.  backing up to surface was challenging. Used gait belt and issued one for family to assist her at home     Vision         Perception     Praxis      Pertinent Vitals/Pain Pain Assessment: 0-10 Pain Score: 5  Pain Location: R foot Pain Descriptors / Indicators: Aching;Sore Pain Intervention(s): Limited activity within patient's tolerance;Monitored during session;Repositioned;Premedicated before session     Hand Dominance     Extremity/Trunk Assessment Upper Extremity Assessment Upper Extremity Assessment: Generalized weakness           Communication Communication Communication: No difficulties   Cognition Arousal/Alertness: Awake/alert Behavior During Therapy: WFL for tasks assessed/performed;Anxious Overall Cognitive Status: Within Functional Limits for tasks assessed                                     General Comments       Exercises     Shoulder Instructions      Home Living Family/patient expects to be discharged to:: Private residence Living Arrangements: Spouse/significant  other Available Help at Discharge: Family                   Bathroom Toilet: Standard         Additional Comments: 3;1 ordered and delivered. Pt will sponge bathe      Prior Functioning/Environment Level of Independence: Independent                 OT Problem List: Decreased strength;Decreased activity tolerance;Decreased knowledge of use of DME or AE;Pain      OT Treatment/Interventions: Self-care/ADL training;DME and/or AE instruction;Patient/family education;Therapeutic activities    OT Goals(Current goals can be found in the care plan section) Acute Rehab OT Goals Patient Stated Goal: home. get better.  OT Goal Formulation: With patient Time For Goal Achievement: 07/02/18 Potential to Achieve  Goals: Good ADL Goals Pt Will Transfer to Toilet: with min guard assist;stand pivot transfer;bedside commode Pt Will Perform Toileting - Clothing Manipulation and hygiene: with min guard assist;sitting/lateral leans;sit to/from stand  OT Frequency: Min 2X/week   Barriers to D/C:            Co-evaluation              AM-PAC PT "6 Clicks" Daily Activity     Outcome Measure Help from another person eating meals?: None Help from another person taking care of personal grooming?: A Little Help from another person toileting, which includes using toliet, bedpan, or urinal?: A Lot Help from another person bathing (including washing, rinsing, drying)?: A Lot Help from another person to put on and taking off regular upper body clothing?: A Little Help from another person to put on and taking off regular lower body clothing?: A Lot 6 Click Score: 16   End of Session    Activity Tolerance: Patient tolerated treatment well Patient left: in bed;with call bell/phone within reach;with bed alarm set  OT Visit Diagnosis: Unsteadiness on feet (R26.81)                Time: 4712-5271 OT Time Calculation (min): 18 min Charges:  OT General Charges $OT Visit: 1 Visit OT Evaluation $OT Eval Low Complexity: 1 Low  Lesle Chris, OTR/L [phone] 06/18/2018  Jakevion Arney 06/18/2018, 3:27 PM

## 2018-06-18 NOTE — Progress Notes (Addendum)
Pharmacy Antibiotic Note  Sara Graves is a 77 y.o. female admitted on 06/14/2018 from podiatrist office for fever, worsening erythema, warmth, and tenderness of diabetic right foot ulcer despite outpatient antibiotics.  Pharmacy was consulted for Vancomycin, Zosyn dosing.  Underwent R foot I&D and 5th metatarsal excision on 7/31.    Bone culture reportedly growing GNR and GPC.  Day #5 abx, Vanco 1250 mg IV q36h, next due tomorrow at 0600. Zosyn 3.375 grams IV q8h (4-hour infusion) Serum creatinine is rising.  Plan:  Vancomycin DCd by attending MD  Continue present Zosyn dosage  F/U on culture results.   Height: 5\' 5"  (165.1 cm) Weight: 195 lb (88.5 kg) IBW/kg (Calculated) : 57  Temp (24hrs), Avg:97.4 F (36.3 C), Min:97.4 F (36.3 C), Max:97.4 F (36.3 C)  Recent Labs  Lab 06/14/18 1155 06/14/18 1204 06/15/18 0416 06/16/18 0422 06/17/18 0426 06/18/18 0441 06/18/18 1524  WBC 7.3  --  7.0 6.1 7.1 6.1  --   CREATININE 1.03*  --  1.01* 1.05*  1.05* 0.94 1.16* 1.29*  LATICACIDVEN  --  0.93  --   --   --   --   --     Estimated Creatinine Clearance: 40.1 mL/min (A) (by C-G formula based on SCr of 1.29 mg/dL (H)).    Allergies  Allergen Reactions  . Rosiglitazone Maleate Anaphylaxis and Swelling  . Morphine Other (See Comments)    SEVERE HYPOTENSION   . Cephalexin Diarrhea  . Sulfa Antibiotics Diarrhea and Nausea And Vomiting  . Elavil [Amitriptyline] Nausea Only  . Tramadol-Acetaminophen Rash    Antimicrobials this admission: 7/29 Zosyn >> 7/29 Vancomycin >>8/2  Dose adjustments this admission: 8/2 Vanco placed on hold due to rising SCr, subsequently [facility]  Microbiology results: 7/29 BCx: ngtd 7/30 MRSA PCR nasal screen: negative (positive for MSSA only) 7/31 Bone tissue: GNR  7/31 Abscess: GNR     Thank you for allowing pharmacy to be a part of this patient's care.  Clayburn Pert, PharmD, BCPS [phone] 06/18/2018  4:15 PM

## 2018-06-18 NOTE — Progress Notes (Signed)
Bone culture: growing rare gram negative rod, and gram positive cocci ID to follow

## 2018-06-18 NOTE — Progress Notes (Signed)
Spoke with patient at bedside. Contacted [facility] and [facility] for [facility] referral, neither could accept. [facility] found to be in network with this plan and has agreed to accept referral. Patient will need RW and 3n1. Contacted [facility] to deliver DME to room. Awaiting final HH orders.

## 2018-06-18 NOTE — Progress Notes (Signed)
PROGRESS NOTE    Sara Graves  UEA:[id number] DOB: January 20, 1941 DOA: 06/14/2018 PCP: [name], PA-C   Brief Narrative:   Sara Graves is a 77 y.o. year old female with medical history significant for CAD status post NSTEMI (DES on 04/2017, now on aspirin and Prasugrel), systolic and Diastolic CHF, HTN, type 2 diabetes, Spinal stimulator who presented on 06/14/2018 as directed by her podiatrist who was concern for developing right foot cellulitis not responding to oral antibiotics.  Her podiatrist [name] saw her on 06/14/2018 for assessment of chronic right foot ulcer. She has had subjective fevers and chills with worsening pain and swelling since this weekend.  She was previously on Keflex.  In the ED she was found to have T-max of 101.9, normal leukocytes.  Blood cultures were obtained.  X-ray right foot showed soft tissue swelling.  CT right foot show generalized soft tissue edema, likely cellulitis with no evidence of osteomyelitis.  She was empirically started on IV Zosyn and Vancomycin. Podiatry evaluated patient on 7/29 and while in hospital discussed possible fifth metatarsal head resection with the patient given chronic ulceration and infection although CT was negative for osteomyelitis. Patient underwent Metatarsal head resection 06/16/18 and per [name] report there was quite a bit of purulence along the dorsal 5th metatarsal head. Await Cx Sensitivities prior to de-escalating Abx and Podiatry recommending NWB. Dressing to be changed yesterday. Bone Cx growing Rare Gram Negative Rods and Rare Unidentified Organism with Sensitivities and speciation pending.   Assessment & Plan:   Principal Problem:   Wound infection Active Problems:   Hypothyroidism   DM (diabetes mellitus), type 2, uncontrolled ([place])   Essential hypertension   NSTEMI (non-ST elevated myocardial infarction) ([place])   Cellulitis of right foot   Cellulitis of left toe  Right foot cellulitis with chronic  ulceration and left toe cellulitis(2nd toe, distal phalanx) s/p Right 5th Metatarsal Excision  -X-ray and CT of right foot (MRI contraindicated in setting of spinal stimulator)shows soft tissue swelling with no signs of osteomyelitis -Left foot x-ray shows soft tissue swelling of distal phalanx of second toe, no evidence of osteomyelitis -Monitor blood cultures; Showed NGTD at 2 days still  -Podiatry [name] consulted, planning on fifth metatarsal head resection on 7/31 -X-ray of left foot (given left second toe cellulitis) showed Postsurgical changes involving the heads of the first and second metatarsals. Moderate degenerative changes of the first and second MTP joints. Fusion across the PIP joint of the second toe. Possible post traumatic fracture of the distal aspect of the middle phalanx of the second toe involving the DIP joint. Soft tissue swelling of the distal phalanx of the second toe may be be post traumatic or reflect cellulitis. No objective evidence of osteomyelitis. -Febrile on admission but otherwise no signs of sepsis -Continue Empiric IV Vancomycin and Zosyn for now and Podiatry to check Wound Cx during surgery  -Cardiology cleared for surgery, will need to continue her DAPT  -Checked Non-invasive arterial studies and as below were normal  -Podiatry recommending Non-Weightbearing postoperatively and recommend CAM boot after surgery -Underwent Right 5th Metatarsal Excision yesterday and Wound Cultures obtained. Aerobic/Anaerobic Cx sent and Gram Stain showed Rare WBC, Predominantly PMN and Bone Cx Sent and Gram Stain showed Rare WBC's Present, Predominantly PMN; Cx's showing Rare Gram Negative Rods and Rare Unidentified Organisms   -Continue to Follow C/x's  -WBC is now 6.1 -Pain Control: Acetaminophen 650 mg po q6hprn Mild Pain/Fever, Oxycodone 5 mg  po q6hprn Moderate Pain and 25 mcg IV Fentanyl q2hprn Severe Pain  -PT/OT Evaluate and Treatand recommending Home Health  PT/OT with 3in1 and Rolling Walker with 5" Wheels -Follow up on Cx's in order to transition to po Abx -Repeat CBC in AM   CAD s/p NSTEMI (04/2018) -On Aspirin 81 mg po Daily and Prasugrel 10 mg po Daily since recent NSTEMI (previously on aspirin and plavix) -[name] spoke with [name] [facility] cardiology) yesterday and patient okay for surgery, should continue DAPT -Remains asymptomatic currently  CKD stage III, stable -Stable and at Baseline -BUN/Cr was 26/1.05 -> 16/0.94 -> 20/1.16 -Started Gentle IVF Hydration with NS at 75 mL/hr x 20 hours -Repeat BMP this Afternoon -Continue to Monitor and Repeat CMP in AM   Type 2 Diabetes -Last A1c on 03/17/2016 -Repeat A1c here was 8.9 -Started Moderate Novolog SSI AC/HS; (at home uses Humalog with meals via V-GO insulin delivery device) -Continue to Monitor CBG's; CBG's ranging from 188-282  Hypertension -BP improved. Now 123/67 -Continue Home Amlodipine 5 mg po Daily, Isosorbide Mononitrate 60 mg po Daily, and Labetalol 300 mg po BID   Anxiety, stable -C/w Home Buspirone 7.5 mg po BID   Hypothyroidism, stable -Continue Levothyroxine 50 mcg po Daily   Hyperlipidemia, stable -Continue Rosuvastatin 40 mg po qHS and Ezetimbie 10 mg po daily   GERD, stable -Continue Pantoprazole 40 mg po BID  Normocytic Anemia -Patient's Hb/Hct went from 10.4/31.6 -> 9.5/29.5 -Likely in the setting of Chronic Kidney Disease -Check Normocytic Anemia -Continue to Monitor for S/Sx of Bleeding -Repeat CBC in AM   Dizziness -? Orthostatic Hypotension -Check Orthostatics -Gentle IVF Hydration with NS at 75 mL/hr  DVT prophylaxis: Enoxaparin 40 mg sq q24h stopped and changed to Heparin 5,000 units sq q8h  Code Status: FULL CODE Family Communication: No family present at bedside  Disposition Plan: Home Health PT/OT once Podiatry Clearance and sensitivities for bone cultures are back    Consultants:   Podiatry [name]   Procedures:  ARTERIAL  ABI completed:    RIGHT    LEFT    PRESSURE WAVEFORM  PRESSURE WAVEFORM  BRACHIAL 179 Triphasic BRACHIAL 191 1.07  DP 199 Biphasic DP 191 Biphasic  AT   AT    PT 139 Biphasic PT 1164 Monophasic  PER   PER    GREAT TOE 37 NA GREAT TOE 80 NA    RIGHT LEFT  ABI 1.11 1.07  TBI 0.21 0.45   Bilateral ABIs are within normal limits at rest. Bilateral TBIs are abnormal at rest.   Antimicrobials:  Anti-infectives (From admission, onward)   Start     Dose/Rate Route Frequency Ordered Stop   06/19/18 1200  vancomycin (VANCOCIN) 1,250 mg in sodium chloride 0.9 % 250 mL IVPB     1,250 mg 166.7 mL/hr over 90 Minutes Intravenous Every 36 hours 06/18/18 1001     06/16/18 0600  vancomycin (VANCOCIN) 1,250 mg in sodium chloride 0.9 % 250 mL IVPB  Status:  Discontinued     1,250 mg 166.7 mL/hr over 90 Minutes Intravenous Every 36 hours 06/14/18 1911 06/18/18 1001   06/14/18 2000  piperacillin-tazobactam (ZOSYN) IVPB 3.375 g     3.375 g 12.5 mL/hr over 240 Minutes Intravenous Every 8 hours 06/14/18 1911     06/14/18 1445  vancomycin (VANCOCIN) 1,500 mg in sodium chloride 0.9 % 500 mL IVPB     1,500 mg 250 mL/hr over 120 Minutes Intravenous STAT 06/14/18 1434 06/14/18  1820   06/14/18 1130  piperacillin-tazobactam (ZOSYN) IVPB 3.375 g     3.375 g 100 mL/hr over 30 Minutes Intravenous  Once 06/14/18 1116 06/14/18 1435     Subjective: Seen and examined at bedside and stated that the world is getting out of bed this morning.  No chest pain, shortness breath, nausea, vomiting.  States pain is better controlled.  No other concerns or complaints at this time  Objective: Vitals:   06/17/18 0515 06/17/18 1331 06/18/18 0941 06/18/18 1338  BP: (!) 157/41 (!) 123/34 (!) 130/53 123/67  Pulse: (!) 55 (!) 48 67 60  Resp: 12 18  18   Temp: 98.3 F (36.8 C)   (!) 97.4 F (36.3 C)  TempSrc: Oral   Oral  SpO2: 91% 96% 98% 97%  Weight:       Height:        Intake/Output Summary (Last 24 hours) at 06/18/2018 1537 Last data filed at 06/18/2018 1400 Gross per 24 hour  Intake 1292.51 ml  Output 1050 ml  Net 242.51 ml   Filed Weights   06/14/18 1040  Weight: 88.5 kg (195 lb)   Examination: Physical Exam:  Constitutional: Well-nourished, well-developed obese Caucasian female currently no acute distress and appears comfortable Eyes: Sclera anicteric.  Lids and conjunctive are normal ENMT: External ears and nose appear normal.  Grossly normal hearing Neck: Supple with no JVD Respiratory: Initial auscultation bilaterally no appreciable wheezing, rales, rhonchi.  Patient is not tachypneic using accessory muscle breathe Cardiovascular: Regular rate and rhythm on the slower side.  Has a slight murmur appreciated.  Mild lower extremity edema Abdomen: Deferred soft, nontender, distended secondary body habitus.  Bowel sounds present all 4 quadrants GU:  Musculoskeletal: No contractures or cyanosis Skin: Left second toe is still slightly swollen but not erythematous.  Right foot is wrapped in Ace bandage and no visible leaking. Neurologic:  Psychiatric: Mood and affect.  Intact judgment insight.  Patient awake alert and oriented x3 cranial nerves II through XII grossly intact no appreciable focal deficits  Data Reviewed: I have personally reviewed following labs and imaging studies  CBC: Recent Labs  Lab 06/14/18 1155 06/15/18 0416 06/16/18 0422 06/17/18 0426 06/18/18 0441  WBC 7.3 7.0 6.1 7.1 6.1  NEUTROABS 5.7  --  4.1 5.0 3.7  HGB 11.4* 10.0* 10.1* 10.4* 9.5*  HCT 34.1* 30.1* 29.8* 31.6* 29.5*  MCV 88.6 89.9 90.9 89.3 91.6  PLT 128* 127* 139* 162 342   Basic Metabolic Panel: Recent Labs  Lab 06/14/18 1155 06/15/18 0416 06/16/18 0422 06/17/18 0426 06/18/18 0441  NA 138 138 141 140 138  K 3.8 3.6 4.1 4.2 4.5  CL 105 109 111 109 107  CO2 22 23 23 23 25   GLUCOSE 160* 129* 167* 112* 205*  BUN 26* 25* 26* 16 20   CREATININE 1.03* 1.01* 1.05*  1.05* 0.94 1.16*  CALCIUM 9.0 8.5* 8.5* 8.5* 8.5*  MG  --   --  2.1 1.9 2.0  PHOS  --   --  2.7 2.8 3.0   GFR: Estimated Creatinine Clearance: 44.6 mL/min (A) (by C-G formula based on SCr of 1.16 mg/dL (H)). Liver Function Tests: Recent Labs  Lab 06/16/18 0422 06/17/18 0426 06/18/18 0441  AST 11* 11* 10*  ALT 9 11 11   ALKPHOS 61 63 55  BILITOT 0.5 0.7 0.7  PROT 6.2* 6.6 6.3*  ALBUMIN 2.8* 2.9* 2.6*   No results for input(s): LIPASE, AMYLASE in the last 168 hours. No results for  input(s): AMMONIA in the last 168 hours. Coagulation Profile: No results for input(s): INR, PROTIME in the last 168 hours. Cardiac Enzymes: No results for input(s): CKTOTAL, CKMB, CKMBINDEX, TROPONINI in the last 168 hours. BNP (last 3 results) No results for input(s): PROBNP in the last 8760 hours. HbA1C: No results for input(s): HGBA1C in the last 72 hours. CBG: Recent Labs  Lab 06/17/18 0804 06/17/18 1159 06/17/18 1707 06/17/18 2216 06/18/18 1155  GLUCAP 109* 171* 188* 232* 282*   Lipid Profile: No results for input(s): CHOL, HDL, LDLCALC, TRIG, CHOLHDL, LDLDIRECT in the last 72 hours. Thyroid Function Tests: No results for input(s): TSH, T4TOTAL, FREET4, T3FREE, THYROIDAB in the last 72 hours. Anemia Panel: No results for input(s): VITAMINB12, FOLATE, FERRITIN, TIBC, IRON, RETICCTPCT in the last 72 hours. Sepsis Labs: Recent Labs  Lab 06/14/18 1204  LATICACIDVEN 0.93    Recent Results (from the past 240 hour(s))  Culture, blood (Routine X 2) w Reflex to ID Panel     Status: None (Preliminary result)   Collection Time: 06/14/18 11:55 AM  Result Value Ref Range Status   Specimen Description   Final    BLOOD RIGHT WRIST Performed at [facility] [address] [place], [place] [address]    Special Requests   Final    BOTTLES DRAWN AEROBIC AND ANAEROBIC Blood Culture adequate volume Performed at [facility] [address] [place], [place] [address]    Culture   Final    NO GROWTH 4 DAYS Performed at [facility], [place] [address] [place], [place] [address]    Report Status PENDING  Incomplete  Culture, blood (Routine X 2) w Reflex to ID Panel     Status: None (Preliminary result)   Collection Time: 06/14/18 11:55 AM  Result Value Ref Range Status   Specimen Description   Final    BLOOD FOREARM Performed at [facility] [address] [place], [place] [address]    Special Requests   Final    BOTTLES DRAWN AEROBIC AND ANAEROBIC Blood Culture adequate volume Performed at [facility] [address] [place], [place] [address]    Culture   Final    NO GROWTH 4 DAYS Performed at [facility], [place] [address] [place], [place] [address]    Report Status PENDING  Incomplete  Surgical pcr screen     Status: Abnormal   Collection Time: 06/15/18 10:24 PM  Result Value Ref Range Status   MRSA, PCR NEGATIVE NEGATIVE Final   Staphylococcus aureus POSITIVE (A) NEGATIVE Final    Comment: (NOTE) The Xpert SA Assay (FDA approved for NASAL specimens in patients 49 years of age and older), is one component of a comprehensive surveillance program. It is not intended to diagnose infection nor to guide or monitor treatment. Performed at [facility], [place] [address], [place], [place] [address]   Aerobic/Anaerobic Culture (surgical/deep wound)     Status: None (Preliminary result)   Collection Time: 06/16/18  6:29 PM  Result Value Ref Range Status   Specimen Description   Final    ABSCESS Performed at [facility] [address], [place], [place] [address]    Special Requests   Final    NONE Performed at [facility], [place] [address] [place], [place] [address]    Gram Stain   Final    RARE WBC PRESENT, PREDOMINANTLY PMN NO ORGANISMS SEEN  Culture   Final    FEW GRAM NEGATIVE  RODS RARE UNIDENTIFIED ORGANISM CRITICAL RESULT CALLED TO, READ BACK BY AND VERIFIED WITH: RN AMANDA LAWSON 06/18/18 AT 69 BY CM Performed at [facility], [place] [address] [place], [place] [address]    Report Status PENDING  Incomplete  Aerobic/Anaerobic Culture (surgical/deep wound)     Status: None (Preliminary result)   Collection Time: 06/16/18  6:53 PM  Result Value Ref Range Status   Specimen Description   Final    BONE Performed at [facility] [address] [place], [place] [address]    Special Requests   Final    METATARSAL HEAD EXCISION Performed at [facility], [place] [address] [place], [place] [address]    Gram Stain   Final    RARE WBC PRESENT, PREDOMINANTLY PMN NO ORGANISMS SEEN    Culture   Final    RARE GRAM NEGATIVE RODS RARE UNIDENTIFIED ORGANISM CRITICAL RESULT CALLED TO, READ BACK BY AND VERIFIED WITH: RN AMANDA LAWSON 06/18/18 AT 1012 BY CM Performed at [facility], [place] [address] [place], [place] [address]    Report Status PENDING  Incomplete    Radiology Studies: No results found. Scheduled Meds: . amLODipine  5 mg Oral Daily  . aspirin  81 mg Oral Daily  . busPIRone  7.5 mg Oral BID  . Chlorhexidine Gluconate Cloth  6 each Topical Daily  . ezetimibe  10 mg Oral Daily  . heparin injection (subcutaneous)  5,000 Units Subcutaneous Q8H  . insulin aspart  0-15 Units Subcutaneous TID WC  . insulin aspart  0-5 Units Subcutaneous QHS  . isosorbide mononitrate  60 mg Oral Daily  . labetalol  300 mg Oral BID  . levothyroxine  50 mcg Oral QAC breakfast  . mupirocin ointment  1 application Nasal BID  . pantoprazole  40 mg Oral BID  . potassium chloride SA  20 mEq Oral BID  . prasugrel  10 mg Oral Daily  . rosuvastatin  40 mg Oral QHS   Continuous Infusions: . sodium chloride 10 mL/hr at 06/16/18 0229  . sodium chloride 75 mL/hr at 06/18/18 1155  . piperacillin-tazobactam (ZOSYN)  IV 3.375 g (06/18/18  1157)  . [START ON 06/19/2018] vancomycin      LOS: 4 days   Kerney Elbe, DO [facility] Pager [phone]  If 7PM-7AM, please contact night-coverage [email or web address] Password [facility] 06/18/2018, 3:37 PM

## 2018-06-18 NOTE — Progress Notes (Signed)
Physical Therapy Treatment Patient Details Name: Sara Graves MRN: [id number] DOB: 09-Jan-1941 Today's Date: 06/18/2018    History of Present Illness 77 yo female admitted with R foot wound infection/cellulitis. S/P I&D, 5th met head excision R foot 7/31. Hx of DM, NSTEMI, OA    PT Comments    Pt requiring steadying assist for standing and during ambulation.  Pt educated on knee scooter use today however would require assist if using at home for safety and steadying.  No CAM boot present in room today however pt maintained NWB very well.    Follow Up Recommendations  Home health PT;Supervision/Assistance - 24 hour     Equipment Recommendations  3in1 (PT);Rolling walker with 5" wheels    Recommendations for Other Services       Precautions / Restrictions Precautions Precautions: Fall Required Braces or Orthoses: Other Brace/Splint Other Brace/Splint: CAM boot (still not present in room) Restrictions RLE Weight Bearing: Non weight bearing Other Position/Activity Restrictions: CAM boot at all times per podiatry op note    Mobility  Bed Mobility Overal bed mobility: Needs Assistance Bed Mobility: Supine to Sit     Supine to sit: Supervision;HOB elevated        Transfers Overall transfer level: Needs assistance Equipment used: Rolling walker (2 wheeled) Transfers: Sit to/from Stand Sit to Stand: Mod assist         General transfer comment: Assist to rise, stabilize, control descent. VCs safety, technique, hand/LE placement. Pt able to maintain NWB in standing. Fatigues fairly easily.  Pt requires UE for standing so had RW and scooter close together, pt able to get on knee scooter however with increased pain and anxiety so assisted back to sitting, after brief rest break and improved pain, pt able to attempt again however only provided HHA and used bed rail to get leg on knee scooter  Ambulation/Gait Ambulation/Gait assistance: Min assist Gait Distance (Feet): 11  Feet Assistive device: None(knee scooter)       General Gait Details: steady assist required, pt fatigued quickly so recliner brought behind pt   Stairs             Wheelchair Mobility    Modified Rankin (Stroke Patients Only)       Balance                                            Cognition Arousal/Alertness: Awake/alert Behavior During Therapy: WFL for tasks assessed/performed;Anxious Overall Cognitive Status: Within Functional Limits for tasks assessed                                        Exercises      General Comments        Pertinent Vitals/Pain Pain Assessment: 0-10 Pain Score: 5  Pain Location: R foot Pain Descriptors / Indicators: Aching;Sore Pain Intervention(s): Limited activity within patient's tolerance;Repositioned;Monitored during session    Home Living                      Prior Function            PT Goals (current goals can now be found in the care plan section) Progress towards PT goals: Progressing toward goals    Frequency    Min 3X/week  PT Plan Current plan remains appropriate    Co-evaluation              AM-PAC PT "6 Clicks" Daily Activity  Outcome Measure  Difficulty turning over in bed (including adjusting bedclothes, sheets and blankets)?: None Difficulty moving from lying on back to sitting on the side of the bed? : A Little Difficulty sitting down on and standing up from a chair with arms (e.g., wheelchair, bedside commode, etc,.)?: Unable Help needed moving to and from a bed to chair (including a wheelchair)?: A Lot Help needed walking in hospital room?: A Lot Help needed climbing 3-5 steps with a railing? : Total 6 Click Score: 13    End of Session Equipment Utilized During Treatment: Gait belt Activity Tolerance: Patient limited by fatigue;Patient limited by pain Patient left: in chair;with bed alarm set;with call bell/phone within reach   PT  Visit Diagnosis: Other abnormalities of gait and mobility (R26.89);Pain;Difficulty in walking, not elsewhere classified (R26.2) Pain - Right/Left: Right Pain - part of body: Ankle and joints of foot     Time: 8882-8003 PT Time Calculation (min) (ACUTE ONLY): 15 min  Charges:  $Gait Training: 8-22 mins                     Carmelia Bake, PT, DPT 06/18/2018 Pager: [phone]  [place] Ram E 06/18/2018, 12:41 PM

## 2018-06-19 LAB — BASIC METABOLIC PANEL
Anion gap: 8 (ref 5–15)
BUN: 25 mg/dL — ABNORMAL HIGH (ref 8–23)
CHLORIDE: 106 mmol/L (ref 98–111)
CO2: 24 mmol/L (ref 22–32)
CREATININE: 1.18 mg/dL — AB (ref 0.44–1.00)
Calcium: 8.7 mg/dL — ABNORMAL LOW (ref 8.9–10.3)
GFR calc non Af Amer: 43 mL/min — ABNORMAL LOW (ref >60)
GFR, EST AFRICAN AMERICAN: 50 mL/min — AB (ref >60)
GLUCOSE: 198 mg/dL — AB (ref 70–99)
Potassium: 4.4 mmol/L (ref 3.5–5.1)
Sodium: 138 mmol/L (ref 135–145)

## 2018-06-19 LAB — CBC WITH DIFFERENTIAL/PLATELET
BASOS ABS: 0 10*3/uL (ref 0.0–0.1)
BASOS PCT: 0 %
Eosinophils Absolute: 0.3 10*3/uL (ref 0.0–0.7)
Eosinophils Relative: 5 %
HCT: 30.5 % — ABNORMAL LOW (ref 36.0–46.0)
HEMOGLOBIN: 9.9 g/dL — AB (ref 12.0–15.0)
Lymphocytes Relative: 36 %
Lymphs Abs: 2 10*3/uL (ref 0.7–4.0)
MCH: 29.7 pg (ref 26.0–34.0)
MCHC: 32.5 g/dL (ref 30.0–36.0)
MCV: 91.6 fL (ref 78.0–100.0)
Monocytes Absolute: 0.5 10*3/uL (ref 0.1–1.0)
Monocytes Relative: 10 %
NEUTROS ABS: 2.8 10*3/uL (ref 1.7–7.7)
Neutrophils Relative %: 49 %
Platelets: 184 10*3/uL (ref 150–400)
RBC: 3.33 MIL/uL — AB (ref 3.87–5.11)
RDW: 14.5 % (ref 11.5–15.5)
WBC: 5.6 10*3/uL (ref 4.0–10.5)

## 2018-06-19 LAB — COMPREHENSIVE METABOLIC PANEL
ALBUMIN: 2.6 g/dL — AB (ref 3.5–5.0)
ALK PHOS: 55 U/L (ref 38–126)
ALT: 10 U/L (ref 0–44)
AST: 10 U/L — AB (ref 15–41)
Anion gap: 7 (ref 5–15)
BUN: 25 mg/dL — AB (ref 8–23)
CO2: 23 mmol/L (ref 22–32)
Calcium: 8.6 mg/dL — ABNORMAL LOW (ref 8.9–10.3)
Chloride: 108 mmol/L (ref 98–111)
Creatinine, Ser: 1.22 mg/dL — ABNORMAL HIGH (ref 0.44–1.00)
GFR calc Af Amer: 48 mL/min — ABNORMAL LOW (ref >60)
GFR calc non Af Amer: 42 mL/min — ABNORMAL LOW (ref >60)
GLUCOSE: 208 mg/dL — AB (ref 70–99)
Potassium: 4.7 mmol/L (ref 3.5–5.1)
SODIUM: 138 mmol/L (ref 135–145)
TOTAL PROTEIN: 6 g/dL — AB (ref 6.5–8.1)
Total Bilirubin: 0.5 mg/dL (ref 0.3–1.2)

## 2018-06-19 LAB — GLUCOSE, CAPILLARY
GLUCOSE-CAPILLARY: 176 mg/dL — AB (ref 70–99)
Glucose-Capillary: 169 mg/dL — ABNORMAL HIGH (ref 70–99)
Glucose-Capillary: 235 mg/dL — ABNORMAL HIGH (ref 70–99)

## 2018-06-19 LAB — CULTURE, BLOOD (ROUTINE X 2)
CULTURE: NO GROWTH
CULTURE: NO GROWTH
SPECIAL REQUESTS: ADEQUATE
Special Requests: ADEQUATE

## 2018-06-19 LAB — PHOSPHORUS: Phosphorus: 3.2 mg/dL (ref 2.5–4.6)

## 2018-06-19 LAB — MAGNESIUM: Magnesium: 1.9 mg/dL (ref 1.7–2.4)

## 2018-06-19 MED ORDER — AMOXICILLIN-POT CLAVULANATE 875-125 MG PO TABS
1.0000 | ORAL_TABLET | Freq: Two times a day (BID) | ORAL | Status: DC
  Administered 2018-06-19: 1 via ORAL
  Filled 2018-06-19: qty 1

## 2018-06-19 MED ORDER — DOXYCYCLINE HYCLATE 100 MG PO TABS
100.0000 mg | ORAL_TABLET | Freq: Two times a day (BID) | ORAL | Status: DC
  Administered 2018-06-19: 100 mg via ORAL
  Filled 2018-06-19: qty 1

## 2018-06-19 MED ORDER — AMOXICILLIN-POT CLAVULANATE 875-125 MG PO TABS: 1.0000 | ORAL_TABLET | Freq: Two times a day (BID) | ORAL | 0 refills | Status: DC

## 2018-06-19 MED ORDER — SODIUM CHLORIDE 0.9 % IV SOLN
INTRAVENOUS | Status: DC
  Administered 2018-06-19 (×2): via INTRAVENOUS

## 2018-06-19 MED ORDER — OXYCODONE HCL 5 MG PO TABS: 5.0000 mg | ORAL_TABLET | Freq: Four times a day (QID) | ORAL | 0 refills | Status: DC | PRN

## 2018-06-19 MED ORDER — SODIUM CHLORIDE 0.9 % IV BOLUS
500.0000 mL | Freq: Once | INTRAVENOUS | Status: AC
  Administered 2018-06-19: 500 mL via INTRAVENOUS

## 2018-06-19 MED ORDER — DOXYCYCLINE HYCLATE 100 MG PO TABS: 100.0000 mg | ORAL_TABLET | Freq: Two times a day (BID) | ORAL | 0 refills | Status: DC

## 2018-06-19 MED ORDER — LABETALOL HCL 100 MG PO TABS
150.0000 mg | ORAL_TABLET | Freq: Two times a day (BID) | ORAL | Status: DC
  Administered 2018-06-19: 150 mg via ORAL
  Filled 2018-06-19: qty 2

## 2018-06-19 NOTE — Progress Notes (Signed)
Occupational Therapy Treatment Patient Details Name: DRISHTI PEPPERMAN MRN: [id number] DOB: 05-Sep-1941 Today's Date: 06/19/2018    History of present illness 77 yo female admitted with R foot wound infection/cellulitis. S/P I&D, 5th met head excision R foot 7/31. Hx of DM, NSTEMI, OA   OT comments  Met with pt lying supine in bed, agreeable to OT treatment this date. CAM boot noted in room, practiced donning CAM boot for increased carryover when returning home. Pt needing mod-max A in donning, but educated in how to direct a caregiver if needed. Supine <> sit completed with supervision/mod I and increased time. Sit <> stand completed with min A with 2WW and steadying A. Bed to [facility] t/f completed with min A and 2WW with VC's for hand placement. Pt maintained NWB status appropriately with CAM boot. Toilet hygiene completed with set up A with sitting/lateral leans. Pt becoming more proficient in engaging in functional mobility backward to surface (from previous OT note). Sit <> suping completed with mod I/supervision and VC's for body placement, pt ultimately needing increased time. Pt with significant family support upon d/c. HHOT remains appropriate to help pt increase functional engagement of BADL/IADL in the home environment.   Follow Up Recommendations  Supervision/Assistance - 24 hour;Home health OT    Equipment Recommendations  3 in 1 bedside commode    Recommendations for Other Services      Precautions / Restrictions Precautions Precautions: Fall Required Braces or Orthoses: Other Brace/Splint Other Brace/Splint: CAM boot Restrictions Weight Bearing Restrictions: Yes RLE Weight Bearing: Non weight bearing Other Position/Activity Restrictions: CAM boot at all times per podiatry op note       Mobility Bed Mobility Overal bed mobility: Needs Assistance Bed Mobility: Supine to Sit     Supine to sit: Supervision;HOB elevated Sit to supine: Supervision;Modified independent  (Device/Increase time)   General bed mobility comments: increased time needed for sit <> supine, with cues appropriately placed foot back on bed  Transfers Overall transfer level: Needs assistance Equipment used: Rolling walker (2 wheeled) Transfers: Sit to/from Stand Sit to Stand: Min assist         General transfer comment: steadying assist and VC's for hand placement    Balance Overall balance assessment: Needs assistance Sitting-balance support: No upper extremity supported Sitting balance-Leahy Scale: Good     Standing balance support: Bilateral upper extremity supported Standing balance-Leahy Scale: Fair                             ADL either performed or assessed with clinical judgement   ADL Overall ADL's : Needs assistance/impaired Eating/Feeding: Set up   Grooming: Set up   Upper Body Bathing: Minimal assistance;Sitting   Lower Body Bathing: Minimal assistance;Sit to/from stand   Upper Body Dressing : Set up;Sitting   Lower Body Dressing: Maximal assistance;Sit to/from stand   Toilet Transfer: Minimal assistance;RW;Stand-pivot;BSC   Toileting- Clothing Manipulation and Hygiene: Sitting/lateral lean;Set up         General ADL Comments: pt has a daughter in law who is a [employer]ar[employer] manager. She will have help for adls as needed. [place] now on foot; she can maintain NWB.     Vision Baseline Vision/History: Wears glasses Wears Glasses: At all times Patient Visual Report: No change from baseline     Perception     Praxis      Cognition Arousal/Alertness: Awake/alert Behavior During Therapy: WFL for tasks assessed/performed;Anxious Overall Cognitive Status: Within Functional Limits  for tasks assessed                                          Exercises     Shoulder Instructions       General Comments R foot dressed and wrapped    Pertinent Vitals/ Pain       Pain Assessment: No/denies pain  Home Living                                           Prior Functioning/Environment              Frequency  Min 2X/week        Progress Toward Goals  OT Goals(current goals can now be found in the care plan section)  Progress towards OT goals: Progressing toward goals  Acute Rehab OT Goals Patient Stated Goal: to return home as soon as possible OT Goal Formulation: With patient Time For Goal Achievement: 07/02/18 Potential to Achieve Goals: Good  Plan Discharge plan remains appropriate    Co-evaluation                 AM-PAC PT "6 Clicks" Daily Activity     Outcome Measure   Help from another person eating meals?: None Help from another person taking care of personal grooming?: A Little Help from another person toileting, which includes using toliet, bedpan, or urinal?: A Little Help from another person bathing (including washing, rinsing, drying)?: A Lot Help from another person to put on and taking off regular upper body clothing?: A Little Help from another person to put on and taking off regular lower body clothing?: A Lot 6 Click Score: 17    End of Session Equipment Utilized During Treatment: Gait belt;Rolling walker  OT Visit Diagnosis: Unsteadiness on feet (R26.81);Other abnormalities of gait and mobility (R26.89)   Activity Tolerance Patient tolerated treatment well   Patient Left in bed;with call bell/phone within reach;with bed alarm set   Nurse Communication Mobility status        Time: 1102-1117 OT Time Calculation (min): 38 min  Charges: OT General Charges $OT Visit: 1 Visit OT Treatments $Self Care/Home Management : 23-37 mins  Zenovia Jarred, [place], OTR/L [phone]  Zenovia Jarred 06/19/2018, 3:07 PM

## 2018-06-19 NOTE — Progress Notes (Signed)
Assessment unchanged. Pt verbalized understanding of dc instructions through teach back including follow up care, when to call the doctor, meds to resume, and wound care. Script x 1 given per MD. HH to follow at home for dressing changes. Supplies for dressing provided per pt request stating "the doctor showed my daughter how to change it if home health is delayed coming." Discharged via wc to front entrance accompanied by NT and husband.

## 2018-06-19 NOTE — Progress Notes (Signed)
Received referral to assist with [facility] RN, OT, PT and an aide. Vinnie Level, [place] contacted [facility] for [facility] referral. Lurlean Horns with [facility] at [phone] for [place] orders (PT, OT, RN and an aide) and made her aware that pt is going home today.

## 2018-06-19 NOTE — Progress Notes (Signed)
Sara Graves is a 77 y.o. female patient. 1. Diabetic foot infection ([place])   2. Wound check, abscess   3. Wound infection    Past Medical History:  Diagnosis Date  . Anxiety   . Arthritis    "hands" (12/29/2017)  . Basal cell carcinoma of nose    removed  . Bursitis of left shoulder   . Cat scratch fever    Late 90s  . Coronary artery disease   . Depression   . Diabetes mellitus, type II, insulin dependent ([place])   . GERD (gastroesophageal reflux disease)   . H. pylori infection 2008 and 1998   treated  . Hyperlipidemia   . Hypertension   . Hypothyroidism   . Multinodular goiter   . Rotator cuff tear, left recurrent   . Urge urinary incontinence   . UTI (lower urinary tract infection) 05/2016   Current Facility-Administered Medications  Medication Dose Route Frequency Provider Last Rate Last Dose  . 0.9 %  sodium chloride infusion   Intravenous PRN Oretha Milch D, MD 10 mL/hr at 06/16/18 0229    . acetaminophen (TYLENOL) tablet 650 mg  650 mg Oral Q6H PRN Debbe Odea, MD   650 mg at 06/17/18 0340   Or  . acetaminophen (TYLENOL) suppository 650 mg  650 mg Rectal Q6H PRN Debbe Odea, MD      . amLODipine (NORVASC) tablet 5 mg  5 mg Oral Daily Debbe Odea, MD   5 mg at 06/18/18 0821  . aspirin chewable tablet 81 mg  81 mg Oral Daily Debbe Odea, MD   81 mg at 06/18/18 4163  . busPIRone (BUSPAR) tablet 7.5 mg  7.5 mg Oral BID Debbe Odea, MD   7.5 mg at 06/18/18 2117  . Chlorhexidine Gluconate Cloth 2 % PADS 6 each  6 each Topical Daily Desiree Hane, MD   6 each at 06/18/18 [phone]  . ezetimibe (ZETIA) tablet 10 mg  10 mg Oral Daily Debbe Odea, MD   10 mg at 06/18/18 0820  . fentaNYL (SUBLIMAZE) injection 25 mcg  25 mcg Intravenous Q2H PRN Raiford [address] [place], DO   25 mcg at 06/17/18 1044  . heparin injection 5,000 Units  5,000 Units Subcutaneous [address] [place], [place]   5,000 Units at 06/19/18 6468  . insulin aspart (novoLOG) injection 0-15 Units  0-15  Units Subcutaneous TID WC Debbe Odea, MD   8 Units at 06/18/18 1756  . insulin aspart (novoLOG) injection 0-5 Units  0-5 Units Subcutaneous QHS Debbe Odea, MD   3 Units at 06/18/18 2300  . isosorbide mononitrate (IMDUR) 24 hr tablet 60 mg  60 mg Oral Daily Debbe Odea, MD   60 mg at 06/18/18 0821  . labetalol (NORMODYNE) tablet 300 mg  300 mg Oral BID Debbe Odea, MD   Stopped at 06/18/18 2357  . levothyroxine (SYNTHROID, LEVOTHROID) tablet 50 mcg  50 mcg Oral QAC breakfast Debbe Odea, MD   50 mcg at 06/18/18 0321  . mupirocin ointment (BACTROBAN) 2 % 1 application  1 application Nasal BID Desiree Hane, MD   1 application at 22/48/25 2116  . oxyCODONE (Oxy IR/ROXICODONE) immediate release tablet 5 mg  5 mg Oral Q6H PRN Raiford Noble [place], DO   5 mg at 06/18/18 1533  . pantoprazole (PROTONIX) EC tablet 40 mg  40 mg Oral BID Debbe Odea, MD   40 mg at 06/18/18 2118  . piperacillin-tazobactam (ZOSYN) IVPB 3.375 g  3.375 g Intravenous Q8H  Randa Spike, RPH 12.5 mL/hr at 06/19/18 0441 3.375 g at 06/19/18 0441  . potassium chloride SA (K-DUR,KLOR-CON) CR tablet 20 mEq  20 mEq Oral BID Debbe Odea, MD   20 mEq at 06/18/18 2117  . prasugrel (EFFIENT) tablet 10 mg  10 mg Oral Daily Debbe Odea, MD   10 mg at 06/18/18 0820  . rosuvastatin (CRESTOR) tablet 40 mg  40 mg Oral QHS Debbe Odea, MD   40 mg at 06/18/18 2118   Allergies  Allergen Reactions  . Rosiglitazone Maleate Anaphylaxis and Swelling  . Morphine Other (See Comments)    SEVERE HYPOTENSION   . Cephalexin Diarrhea  . Sulfa Antibiotics Diarrhea and Nausea And Vomiting  . Elavil [Amitriptyline] Nausea Only  . Tramadol-Acetaminophen Rash   Principal Problem:   Wound infection Active Problems:   Hypothyroidism   DM (diabetes mellitus), type 2, uncontrolled ([place])   Essential hypertension   NSTEMI (non-ST elevated myocardial infarction) ([place])   Cellulitis of right foot   Cellulitis of left toe    Dizziness  Blood pressure (!) 166/61, pulse 62, temperature 97.6 F (36.4 C), temperature source Oral, resp. rate 16, height 5\' 5"  (1.651 m), weight 195 lb (88.5 kg), SpO2 94 %.   Subjective  POD #3 s/p right foot I&D, wound excision and 5th metatarsal head excision. She states that she is feeling 100% better and she slept well last night without taking any pain medication.  He feels much better than prior to surgery.  She is able to move her toes without any tingling that she states that she was experiencing prior to the surgery.  She denies any systemic complaints including fevers, chills, nausea, vomiting, chest pain, shortness of breath, calf pain.   Objective  AAO x3, NAD  Dermatological: Ulceration right foot submetatarsal 5.  Upon evaluation of the area there is no drainage or pus identified today.  The wound still probes but there is no undermining.  There is much improved edema and erythema to the area.  There is mild increase in warmth of the foot still but overall is improving.  Minimal discomfort to the foot which is mostly along the surgical site.  There is no areas of fluctuation or crepitation.  There is no malodor.  No ascending cellulitis identified today.  On the left second toe the erythema on the toe is much improved as well.  Mild hyperkeratotic tissue to the distal aspect of the left second toe.  There is no drainage or pus.  No other open lesions are identified bilaterally.  Vascular: Dorsalis Pedis artery and Posterior Tibial artery pedal pulses are 2/4. There is no pain with calf compression, swelling, warmth, erythema.    Musculoskeletal: Decrease in pain in the surgical site.  Overall she is doing much better.  Muscular strength 5/5 in all groups tested bilateral.  Gait: Unassisted, Nonantalgic.    Assessment & Plan Postop day #3 status post right foot incision and drainage, fifth metatarsal head excision, with excision -I did change the dressing today.  The area  was flushed with saline and a corner of a 4 x 4 dressing was applied to the wound to pack it followed by dry sterile dressing.  She will do daily dressing changes at home with a similar dressing. -Continue nonweightbearing. -Continue current IV antibiotics.  Waiting for cultures to finalize.  Pending cultures possible infectious disease consultation. Creatine elevated- somewhat improved today.  -She is able to be discharged pending cultures, antibiotics. -Elevation in all  times. -Continue current pain management.  Currently under control. -Will continue to follow.   Trula Slade 06/19/2018  O: [phone] A:[phone]

## 2018-06-20 NOTE — Discharge Summary (Signed)
Physician Discharge Summary  GAO MITNICK LXB:[id number] DOB: Feb 02, 1941 DOA: 06/14/2018  PCP: [name], PA-C  Admit date: 06/14/2018 Discharge date: 06/20/2018  Admitted From: Home Disposition: Home with Home Health PT/OT/RN/Aide  Recommendations for Outpatient Follow-up:  1. Follow up with PCP in 1-2 weeks 2. Follow up with Podiatry [name] and appointment scheduled for you on 06/24/18 3. Continue Abx for at least 1 month and defer to Podiatry to stop early or continue 4. Please obtain CMP/CBC, Mag Phos in one week 5. Please follow up on the following pending results:  Home Health: YES Equipment/Devices: 3 in 1 Bedside, Rolling Walker with 5" Wheels  Discharge Condition: Stable CODE STATUS: FULL CODE  Diet recommendation: Heart Healthy Carb Modified Diet  Brief/Interim Summary: Sara Mcgruder Moffittis a 77 y.o.year old femalewith medical history significant for CAD status post NSTEMI (DES on 04/2017, now on aspirin and Prasugrel), systolic and Diastolic CHF, HTN, type 2 diabetes, Spinal stimulatorwho presented on7/29/2019as directed by her podiatrist who was concern for developing right foot cellulitis not responding to oral antibiotics.Her podiatrist Dr. Aline August her on 06/14/2018 for assessment of chronic right foot ulcer. She has had subjective fevers and chills with worsening pain and swelling since this weekend. She was previously on Keflex.  In the ED she was found to have T-max of 101.9, normal leukocytes. Blood cultures were obtained. X-ray right foot showed soft tissue swelling. CT right foot show generalized soft tissue edema, likely cellulitis with no evidence of osteomyelitis.   She was empirically started on IV Zosyn and Vancomycin. Podiatry evaluated patient on 7/29 and while in hospital discussed possible fifth metatarsal head resection with the patient given chronic ulceration and infection although CT was negative for osteomyelitis. Patient underwent  Metatarsal head resection 06/16/18 and per [name] report there was quite a bit of purulence along the dorsal 5th metatarsal head. Awaited Cx Sensitivities prior to de-escalating Abx and Podiatry recommending NWB.  Bone Cx growing Rare Gram Negative Rods and Rare Unidentified Organism with Sensitivities and speciation pending. Cx Results finalized were Proteus Mirabilis, Strep Group B, and Staph Epidermidis. Discussed Case with Infectious Diseases and patient placed on Augmentin and Doxycyline. Dr. Jacqualyn Posey showed patient how to do dressing changes and Home Health was set up. She was deemed medically stable to be D/C'd Home with Home Health and will need to follow up with PCP and [name] in Podiatry in the outpatient setting.   Discharge Diagnoses:  Principal Problem:   Wound infection Active Problems:   Hypothyroidism   DM (diabetes mellitus), type 2, uncontrolled ([place])   Essential hypertension   NSTEMI (non-ST elevated myocardial infarction) ([place])   Cellulitis of right foot   Cellulitis of left toe   Dizziness  Right foot cellulitis and Osteomyelitis with chronic ulcerationand left toe cellulitis(2nd toe, distal phalanx) s/p Right 5th Metatarsal Excision, improved   -X-rayandCT of right foot(MRI contraindicated in setting of spinal stimulator)shows soft tissue swelling with no signs of osteomyelitis -Left foot x-ray shows soft tissue swelling of distal phalanx of second toe, no evidence of osteomyelitis -Monitor blood cultures; Showed NGTD at 4 Days  -Podiatry [name] consulted, planning on fifth metatarsal head resection on 7/31 -X-ray of left foot (given left second toe cellulitis) showed Postsurgical changes involving the heads of the first and second metatarsals. Moderate degenerative changes of the first and second MTP joints. Fusion across the PIP joint of the second toe. Possible post traumatic fracture of the distal aspect of the  middle phalanx of the second toe  involving the DIP joint. Soft tissue swelling of the distal phalanx of the second toe may be be post traumatic or reflect cellulitis. No objective evidence of osteomyelitis on X-Ray but Bone Had Purulence and had osteo when she went for Surgery. -Febrile on admission but otherwise no signs of sepsis -Continued Empiric IV Vancomycin and Zosyn and transitioned to po Augmentin and po Doxycyline at D/C -Cardiology cleared for surgery, will need to continue her DAPT -Checked Non-invasive arterial studies and as below were normal  -Podiatry recommending Non-Weightbearing postoperatively and recommend CAM boot after surgery -Underwent Right 5th Metatarsal Excision yesterday and Wound Cultures obtained. Aerobic/Anaerobic Cx sent and Gram Stain showed Rare WBC, Predominantly PMN and Bone Cx Sent and Gram Stain showed Rare WBC's Present, Predominantly PMN; Cx's showing Rare Gram Negative Rods and Rare Unidentified Organisms   -Final Cx's showed Proteus Mirabilis, Group B Strep and Staph Epidermidis  -WBC is now 5.6 -Left Foot 2nd Toe Cellulitis improved  -Pain Control: Acetaminophen 650 mg po q6hprn Mild Pain/Fever, Oxycodone 5 mg po q6hprn Moderate Pain and 25 mcg IV Fentanyl q2hprn Severe Pain; Will write for Oxycodone at D/C  -PT/OT Evaluate and Treatand recommending Home Health PT/OT with 3in1 and Rolling Walker with 5" Wheels -Transitioned IV Abx to po Augmentin and Doxycycline for at least 1 month  -Repeat CBC as an outpatient -PT/OT recommending Home Health PT/OT  CAD s/p NSTEMI (04/2018) -On Aspirin 81 mg po Daily and Prasugrel 10 mg po Daily since recent NSTEMI (previously on aspirin and plavix) -[name] spoke with [name] [facility] cardiology) yesterday and patient okay for surgery, should continue DAPT -Remains asymptomatic currently  CKD stage III, stable -Stable and at Baseline -BUN/Cr was 26/1.05 -> 16/0.94 -> 20/1.16 -> 24/1.29 -> 25/1.22 -> 25/1.18 -Started Gentle IVF Hydration  with NS at 75 mL/hr x 20 hours and stopped prior to D/C  -Continue to Monitor and Repeat CMP as an outpatient    Uncontrolled Type 2 Diabetes -Last A1c on 03/17/2016 -Repeat A1c here was 8.9 -Started Moderate Novolog SSI AC/HS; (at home uses Humalog with mealsvia V-GOinsulin delivery device) -Resume Home Medication -Continue to Monitor CBG's; CBG's ranging from 169-282  Hypertension -BP improved. Now 123/67 -Continue Home Amlodipine 5 mg po Daily, Isosorbide Mononitrate 60 mg po Daily, and Labetalol 300 mg po BID   Anxiety, stable -C/w Home Buspirone 7.5 mg po BID   Hypothyroidism, stable -Continue Levothyroxine 50 mcg po Daily   Hyperlipidemia, stable -Continue Rosuvastatin 40 mg po qHS and Ezetimbie 10 mg po daily   GERD, stable -Continue Pantoprazole 40 mg po BID  Normocytic Anemia -Patient's Hb/Hct went from 10.4/31.6 -> 9.5/29.5 -> 9.9/30.5 -Likely in the setting of Chronic Kidney Disease -Check Anemia Panel as an outpatient  -Continue to Monitor for S/Sx of Bleeding -Repeat CBC in AM   Dizziness, improved  -? Orthostatic Hypotension -Check Orthostatics -Gentle IVF Hydration with NS at 75 mL/hr and now stopped   Discharge Instructions  Discharge Instructions    Call MD for:  difficulty breathing, headache or visual disturbances   Complete by:  As directed    Call MD for:  extreme fatigue   Complete by:  As directed    Call MD for:  hives   Complete by:  As directed    Call MD for:  persistant dizziness or light-headedness   Complete by:  As directed    Call MD for:  persistant nausea and vomiting  Complete by:  As directed    Call MD for:  redness, tenderness, or signs of infection (pain, swelling, redness, odor or green/yellow discharge around incision site)   Complete by:  As directed    Call MD for:  severe uncontrolled pain   Complete by:  As directed    Call MD for:  temperature >100.4   Complete by:  As directed    Diet - low sodium heart  healthy   Complete by:  As directed    Diet Carb Modified   Complete by:  As directed    Discharge instructions   Complete by:  As directed    Follow-up with PCP, podiatry, and cardiology in outpatient setting.  Take all medication as prescribed.  If symptoms change or worsen please return the emergency room for evaluation.   Increase activity slowly   Complete by:  As directed      Allergies as of 06/19/2018      Reactions   Rosiglitazone Maleate Anaphylaxis, Swelling   Morphine Other (See Comments)   SEVERE HYPOTENSION   Cephalexin Diarrhea   Sulfa Antibiotics Diarrhea, Nausea And Vomiting   Elavil [amitriptyline] Nausea Only   Tramadol-acetaminophen Rash      Medication List    STOP taking these medications   cefpodoxime 200 MG tablet Commonly known as:  VANTIN   cephALEXin 500 MG capsule Commonly known as:  KEFLEX     TAKE these medications   amLODipine 5 MG tablet Commonly known as:  NORVASC Take 1 tablet (5 mg total) by mouth daily.   amoxicillin-clavulanate 875-125 MG tablet Commonly known as:  AUGMENTIN Take 1 tablet by mouth every 12 (twelve) hours.   [employer] ASPIRIN 81 MG chewable tablet Generic drug:  aspirin Chew 81 mg by mouth daily.   busPIRone 7.5 MG tablet Commonly known as:  BUSPAR Take 7.5 mg by mouth 2 (two) times daily.   CALCIUM 600 + D PO Take 1 tablet by mouth daily.   doxycycline 100 MG tablet Commonly known as:  VIBRA-TABS Take 1 tablet (100 mg total) by mouth every 12 (twelve) hours.   ezetimibe 10 MG tablet Commonly known as:  ZETIA Take 10 mg by mouth daily.   FARXIGA 10 MG Tabs tablet Generic drug:  dapagliflozin propanediol Take 10 mg by mouth daily.   Fish Oil 1000 MG Caps Take 1,000 mg by mouth 2 (two) times daily. OTC fish oil   HUMALOG 100 UNIT/ML injection Generic drug:  insulin lispro Inject 56 Units into the skin See admin instructions. For [place] e with V - GO 20 Insulin Delivery Device. Total : 56 Units /  Day.   Investigational - Study Medication Take 1 tablet by mouth daily. Study name: dapagliflozin/metformin ER 10/1000mg  Additional study details: This is a Drug Study medication from Dr. Nadyne Coombes at [facility]. Patient started taking this medication on 04/22/18 and is unsure of how long she is to take this medication for. Per patient, she is on this medication as part of a Heart and Diabetes drug study.   isosorbide mononitrate 60 MG 24 hr tablet Commonly known as:  IMDUR Take 60 mg by mouth daily.   labetalol 300 MG tablet Commonly known as:  NORMODYNE Take 1 tablet (300 mg total) by mouth 2 (two) times daily.   levothyroxine 50 MCG tablet Commonly known as:  SYNTHROID, LEVOTHROID Take 50 mcg by mouth every morning.   nitroGLYCERIN 0.4 MG SL tablet Commonly known as:  NITROSTAT Place 1  tablet (0.4 mg total) under the tongue every 5 (five) minutes x 3 doses as needed for chest pain.   oxyCODONE 5 MG immediate release tablet Commonly known as:  Oxy IR/ROXICODONE Take 1 tablet (5 mg total) by mouth every 6 (six) hours as needed for moderate pain.   pantoprazole 40 MG tablet Commonly known as:  PROTONIX Take 1 tablet (40 mg total) by mouth 2 (two) times daily.   potassium chloride SA 20 MEQ tablet Commonly known as:  KLOR-CON M20 Take 1 tablet (20 mEq total) by mouth 2 (two) times daily. With Torsemide as needed for leg swelling   prasugrel 10 MG Tabs tablet Commonly known as:  EFFIENT Take 1 tablet (10 mg total) by mouth daily.   rosuvastatin 40 MG tablet Commonly known as:  CRESTOR Take 1 tablet (40 mg total) by mouth at bedtime.   torsemide 20 MG tablet Commonly known as:  DEMADEX TAKE 1 TABLET DAILY AS NEEDED FOR LEG EDEMA   Vitamin D (Ergocalciferol) 50000 units Caps capsule Commonly known as:  DRISDOL Take 50,000 Units by mouth every Thursday.       Allergies  Allergen Reactions  . Rosiglitazone Maleate Anaphylaxis and Swelling  . Morphine Other (See  Comments)    SEVERE HYPOTENSION   . Cephalexin Diarrhea  . Sulfa Antibiotics Diarrhea and Nausea And Vomiting  . Elavil [Amitriptyline] Nausea Only  . Tramadol-Acetaminophen Rash   Consultations:  Podiatry [name]  Procedures/Studies: Dg Chest 2 View  Result Date: 06/14/2018 CLINICAL DATA:  Right-sided dorsal foot pain. Redness and swelling. No trauma. EXAM: CHEST - 2 VIEW COMPARISON:  April 25, 2018 FINDINGS: Mild cardiomegaly. The hila and mediastinum are normal. No pulmonary nodules, masses, or focal infiltrates. No overt edema. No suspicious infiltrate. IMPRESSION: No active cardiopulmonary disease. Electronically Signed   By: Dorise Bullion III M.D   On: 06/14/2018 12:59   Ct Foot Right W Contrast  Result Date: 06/14/2018 CLINICAL DATA:  Right foot pain and swelling with fever for several days. History of right foot callus debridement. History of diabetes. EXAM: CT OF THE LOWER RIGHT EXTREMITY WITH CONTRAST TECHNIQUE: Multidetector CT imaging of the right foot was performed according to the standard protocol following intravenous contrast administration. COMPARISON:  Radiographs 06/14/2018 and 06/09/2018. CONTRAST:  170mL ISOVUE-300 IOPAMIDOL (ISOVUE-300) INJECTION 61% FINDINGS: Bones/Joint/Cartilage There is no evidence of acute fracture, dislocation or bone destruction. The bones appear demineralized. There are stable postsurgical changes in the 1st and 2nd metatarsal heads with a metallic pin in the 1st metatarsal neck and a cortical screw in the 2nd metatarsal head. The proximal and middle phalanges of the 2nd toe appear fused. Ligaments Suboptimally assessed by CT. Muscles and Tendons The ankle tendons appear grossly intact. No focal muscular abnormalities are seen. Soft tissues The soft tissues appear diffusely edematous. No focal fluid collection, foreign body or soft tissue emphysema identified. There is no obvious skin ulceration. IMPRESSION: 1. Nonspecific generalized  soft tissue edema, likely cellulitis. Correlate clinically. No focal fluid collection or skin ulceration identified. 2. No acute osseous findings or evidence of osteomyelitis. 3. Postsurgical changes as described. Electronically Signed   By: Richardean Sale M.D.   On: 06/14/2018 17:48   Dg Foot Complete Left  Result Date: 06/15/2018 CLINICAL DATA:  Left foot pain centered at the left second toe. EXAM: LEFT FOOT - COMPLETE 3+ VIEW COMPARISON:  AP and lateral views of the left foot dated Mar 17, 2018 FINDINGS: The patient has undergone bunionectomy. There  is also a cortical screw through the head of the second metatarsal. There is mild degenerative change of the first and second MTP joints. There is fusion across the PIP joint of the second toe. There is contour deformity of the medial aspect of the articular surface of the middle phalanx of the second toe at the DIP joint which could reflect an acute fracture. The third, fourth, and fifth toes are unremarkable. There are degenerative changes of the tarsometatarsal joints especially laterally. There is mild soft tissue swelling involving the distal phalanx of the second toe. No soft tissue gas collections or abnormal calcifications are observed here. IMPRESSION: Postsurgical changes involving the heads of the first and second metatarsals. Moderate degenerative changes of the first and second MTP joints. Fusion across the PIP joint of the second toe. Possible post traumatic fracture of the distal aspect of the middle phalanx of the second toe involving the DIP joint. Soft tissue swelling of the distal phalanx of the second toe may be be post traumatic or reflect cellulitis. No objective evidence of osteomyelitis. Electronically Signed   By: David  Martinique M.D.   On: 06/15/2018 09:33   Dg Foot Complete Right  Result Date: 06/14/2018 CLINICAL DATA:  Dorsal right foot pain redness, and swelling for the past 2 weeks. No injury. EXAM: RIGHT FOOT COMPLETE - 3+ VIEW  COMPARISON:  Right foot series of June 09, 2018 FINDINGS: The bones are subjectively adequately mineralized. There is swelling of the forefoot. The patient has undergone previous bunionectomy with a pin placed at the junction of the shaft and head of the first metatarsal. A cortical screw is present involving the head of the second metatarsal. The phalanges are intact as are the third through fifth metatarsals. The tarsal bones exhibit no acute abnormalities. There are plantar and Achilles region calcaneal spurs. IMPRESSION: There are no acute bony abnormalities. There is soft tissue swelling over the forefoot. Electronically Signed   By: David  Martinique M.D.   On: 06/14/2018 13:02   Dg Foot Complete Right  Result Date: 06/09/2018 Please see detailed radiograph report in office note.    Subjective: And examined at bedside was doing well.  Denied any chest pain, shortness breath, lightheadedness or dizziness.  Foot pain is improved.  Ready to go home   Discharge Exam: Vitals:   06/19/18 1406 06/19/18 1411  BP: (!) 141/32   Pulse: (!) 43 60  Resp: 16   Temp: 98.3 F (36.8 C)   SpO2: 97%    Vitals:   06/19/18 0454 06/19/18 1053 06/19/18 1406 06/19/18 1411  BP: (!) 166/61 (!) 146/42 (!) 141/32   Pulse: 62 67 (!) 43 60  Resp: 16 18 16    Temp: 97.6 F (36.4 C) 98.1 F (36.7 C) 98.3 F (36.8 C)   TempSrc: Oral Oral Oral   SpO2: 94% 93% 97%   Weight:      Height:       General: Pt is alert, awake, not in acute distress Cardiovascular: RRR, S1/S2 +, no rubs, no gallops Respiratory: Diminished bilaterally, no wheezing, no rhonchi Abdominal: Soft, NT, Distended slightly 2/2 body habitus, bowel sounds + Extremities: Mild edema, no cyanosis; Right Foot wrapped; Left Toe Cellulitis improved   The results of significant diagnostics from this hospitalization (including imaging, microbiology, ancillary and laboratory) are listed below for reference.    Microbiology: Recent Results (from the  past 240 hour(s))  Culture, blood (Routine X 2) w Reflex to ID Panel     Status:  None   Collection Time: 06/14/18 11:55 AM  Result Value Ref Range Status   Specimen Description   Final    BLOOD RIGHT WRIST Performed at [facility] [address], [place], [place] [address]    Special Requests   Final    BOTTLES DRAWN AEROBIC AND ANAEROBIC Blood Culture adequate volume Performed at [facility] [address] [place], [place] [address]    Culture   Final    NO GROWTH 5 DAYS Performed at [facility], [place] [address] [place], [place] [address]    Report Status 06/19/2018 FINAL  Final  Culture, blood (Routine X 2) w Reflex to ID Panel     Status: None   Collection Time: 06/14/18 11:55 AM  Result Value Ref Range Status   Specimen Description   Final    BLOOD FOREARM Performed at [facility] [address] [place], [place] [address]    Special Requests   Final    BOTTLES DRAWN AEROBIC AND ANAEROBIC Blood Culture adequate volume Performed at [facility] [address], [place], [place] [address]    Culture   Final    NO GROWTH 5 DAYS Performed at [facility], [place] [address], [place], [place] [address]    Report Status 06/19/2018 FINAL  Final  Surgical pcr screen     Status: Abnormal   Collection Time: 06/15/18 10:24 PM  Result Value Ref Range Status   MRSA, PCR NEGATIVE NEGATIVE Final   Staphylococcus aureus POSITIVE (A) NEGATIVE Final    Comment: (NOTE) The Xpert SA Assay (FDA approved for NASAL specimens in patients 43 years of age and older), is one component of a comprehensive surveillance program. It is not intended to diagnose infection nor to guide or monitor treatment. Performed at [facility], [place] [address], [place], [place] [address]   Aerobic/Anaerobic Culture (surgical/deep wound)     Status: None (Preliminary result)   Collection Time: 06/16/18   6:29 PM  Result Value Ref Range Status   Specimen Description   Final    ABSCESS Performed at [facility] [address] [place], [place] [address]    Special Requests   Final    NONE Performed at [facility], [place] [address], [place], [place] [address]    Gram Stain   Final    RARE WBC PRESENT, PREDOMINANTLY PMN NO ORGANISMS SEEN Performed at [facility], [place] [address], [place], [place] [address]    Culture   Final    FEW PROTEUS MIRABILIS RARE GROUP B STREP(S.AGALACTIAE)ISOLATED CRITICAL RESULT CALLED TO, READ BACK BY AND VERIFIED WITH: RN AMANDA LAWSON 06/18/18 AT 1015 BY CM NO ANAEROBES ISOLATED; CULTURE IN PROGRESS FOR 5 DAYS    Report Status PENDING  Incomplete   Organism ID, Bacteria PROTEUS MIRABILIS  Final      Susceptibility   Proteus mirabilis - MIC*    AMPICILLIN <=2 SENSITIVE Sensitive     CEFAZOLIN <=4 SENSITIVE Sensitive     CEFEPIME <=1 SENSITIVE Sensitive     CEFTAZIDIME <=1 SENSITIVE Sensitive     CEFTRIAXONE <=1 SENSITIVE Sensitive     CIPROFLOXACIN <=0.25 SENSITIVE Sensitive     GENTAMICIN <=1 SENSITIVE Sensitive     IMIPENEM 8 INTERMEDIATE Intermediate     TRIMETH/SULFA <=20 SENSITIVE Sensitive     AMPICILLIN/SULBACTAM <=2 SENSITIVE Sensitive     PIP/TAZO <=4 SENSITIVE Sensitive     *  FEW PROTEUS MIRABILIS  Aerobic/Anaerobic Culture (surgical/deep wound)     Status: None (Preliminary result)   Collection Time: 06/16/18  6:53 PM  Result Value Ref Range Status   Specimen Description   Final    BONE Performed at [facility] [address], [place], [place] [address]    Special Requests   Final    METATARSAL HEAD EXCISION Performed at [facility], [place] [address], [place], [place] [address]    Gram Stain   Final    RARE WBC PRESENT, PREDOMINANTLY PMN NO ORGANISMS SEEN Performed at [facility], [place] [address], [place], [place] [address]    Culture   Final     RARE PROTEUS MIRABILIS RARE STAPHYLOCOCCUS EPIDERMIDIS CRITICAL RESULT CALLED TO, READ BACK BY AND VERIFIED WITH: RN AMANDA LAWSON 06/18/18 AT 1012 BY CM NO ANAEROBES ISOLATED; CULTURE IN PROGRESS FOR 5 DAYS    Report Status PENDING  Incomplete   Organism ID, Bacteria PROTEUS MIRABILIS  Final   Organism ID, Bacteria STAPHYLOCOCCUS EPIDERMIDIS  Final      Susceptibility   Proteus mirabilis - MIC*    AMPICILLIN <=2 SENSITIVE Sensitive     CEFAZOLIN <=4 SENSITIVE Sensitive     CEFEPIME <=1 SENSITIVE Sensitive     CEFTAZIDIME <=1 SENSITIVE Sensitive     CEFTRIAXONE <=1 SENSITIVE Sensitive     CIPROFLOXACIN <=0.25 SENSITIVE Sensitive     GENTAMICIN <=1 SENSITIVE Sensitive     IMIPENEM 2 SENSITIVE Sensitive     TRIMETH/SULFA <=20 SENSITIVE Sensitive     AMPICILLIN/SULBACTAM <=2 SENSITIVE Sensitive     PIP/TAZO <=4 SENSITIVE Sensitive     * RARE PROTEUS MIRABILIS   Staphylococcus epidermidis - MIC*    CIPROFLOXACIN 4 RESISTANT Resistant     ERYTHROMYCIN >=8 RESISTANT Resistant     GENTAMICIN <=0.5 SENSITIVE Sensitive     OXACILLIN >=4 RESISTANT Resistant     TETRACYCLINE 2 SENSITIVE Sensitive     VANCOMYCIN 1 SENSITIVE Sensitive     TRIMETH/SULFA <=10 SENSITIVE Sensitive     CLINDAMYCIN >=8 RESISTANT Resistant     RIFAMPIN <=0.5 SENSITIVE Sensitive     Inducible Clindamycin NEGATIVE Sensitive     * RARE STAPHYLOCOCCUS EPIDERMIDIS    Labs: BNP (last 3 results) Recent Labs    04/25/18 1256  BNP 740.8*   Basic Metabolic Panel: Recent Labs  Lab 06/16/18 0422 06/17/18 0426 06/18/18 0441 06/18/18 1524 06/19/18 0422 06/19/18 1345  NA 141 140 138 139 138 138  K 4.1 4.2 4.5 5.0 4.7 4.4  CL 111 109 107 105 108 106  CO2 23 23 25 25 23 24   GLUCOSE 167* 112* 205* 291* 208* 198*  BUN 26* 16 20 24* 25* 25*  CREATININE 1.05*  1.05* 0.94 1.16* 1.29* 1.22* 1.18*  CALCIUM 8.5* 8.5* 8.5* 8.7* 8.6* 8.7*  MG 2.1 1.9 2.0  --  1.9  --   PHOS 2.7 2.8 3.0  --  3.2  --    Liver  Function Tests: Recent Labs  Lab 06/16/18 0422 06/17/18 0426 06/18/18 0441 06/19/18 0422  AST 11* 11* 10* 10*  ALT 9 11 11 10   ALKPHOS 61 63 55 55  BILITOT 0.5 0.7 0.7 0.5  PROT 6.2* 6.6 6.3* 6.0*  ALBUMIN 2.8* 2.9* 2.6* 2.6*   No results for input(s): LIPASE, AMYLASE in the last 168 hours. No results for input(s): AMMONIA in the last 168 hours. CBC: Recent Labs  Lab 06/14/18 1155 06/15/18 0416 06/16/18 0422  06/17/18 0426 06/18/18 0441 06/19/18 0422  WBC 7.3 7.0 6.1 7.1 6.1 5.6  NEUTROABS 5.7  --  4.1 5.0 3.7 2.8  HGB 11.4* 10.0* 10.1* 10.4* 9.5* 9.9*  HCT 34.1* 30.1* 29.8* 31.6* 29.5* 30.5*  MCV 88.6 89.9 90.9 89.3 91.6 91.6  PLT 128* 127* 139* 162 158 184   Cardiac Enzymes: No results for input(s): CKTOTAL, CKMB, CKMBINDEX, TROPONINI in the last 168 hours. BNP: Invalid input(s): POCBNP CBG: Recent Labs  Lab 06/18/18 1736 06/18/18 2158 06/19/18 0741 06/19/18 1159 06/19/18 1625  GLUCAP 254* 253* 169* 235* 176*   D-Dimer No results for input(s): DDIMER in the last 72 hours. Hgb A1c No results for input(s): HGBA1C in the last 72 hours. Lipid Profile No results for input(s): CHOL, HDL, LDLCALC, TRIG, CHOLHDL, LDLDIRECT in the last 72 hours. Thyroid function studies No results for input(s): TSH, T4TOTAL, T3FREE, THYROIDAB in the last 72 hours.  Invalid input(s): FREET3 Anemia work up No results for input(s): VITAMINB12, FOLATE, FERRITIN, TIBC, IRON, RETICCTPCT in the last 72 hours. Urinalysis    Component Value Date/Time   COLORURINE YELLOW 06/15/2018 0419   APPEARANCEUR CLEAR 06/15/2018 0419   LABSPEC 1.036 (H) 06/15/2018 0419   PHURINE 5.0 06/15/2018 0419   GLUCOSEU >=500 (A) 06/15/2018 0419   HGBUR NEGATIVE 06/15/2018 0419   HGBUR negative 12/03/2010 1405   BILIRUBINUR NEGATIVE 06/15/2018 0419   BILIRUBINUR NEG 06/18/2012 0937   KETONESUR 5 (A) 06/15/2018 0419   PROTEINUR 30 (A) 06/15/2018 0419   UROBILINOGEN 1.0 05/03/2015 1055   NITRITE  NEGATIVE 06/15/2018 0419   LEUKOCYTESUR NEGATIVE 06/15/2018 0419   Sepsis Labs Invalid input(s): PROCALCITONIN,  WBC,  LACTICIDVEN Microbiology Recent Results (from the past 240 hour(s))  Culture, blood (Routine X 2) w Reflex to ID Panel     Status: None   Collection Time: 06/14/18 11:55 AM  Result Value Ref Range Status   Specimen Description   Final    BLOOD RIGHT WRIST Performed at [facility] [address], [place], [place] [address]    Special Requests   Final    BOTTLES DRAWN AEROBIC AND ANAEROBIC Blood Culture adequate volume Performed at [facility] [address] [place], [place] [address]    Culture   Final    NO GROWTH 5 DAYS Performed at [facility], [place] [address] [place], [place] [address]    Report Status 06/19/2018 FINAL  Final  Culture, blood (Routine X 2) w Reflex to ID Panel     Status: None   Collection Time: 06/14/18 11:55 AM  Result Value Ref Range Status   Specimen Description   Final    BLOOD FOREARM Performed at [facility] [address], [place], [place] [address]    Special Requests   Final    BOTTLES DRAWN AEROBIC AND ANAEROBIC Blood Culture adequate volume Performed at [facility] [address], [place], [place] [address]    Culture   Final    NO GROWTH 5 DAYS Performed at [facility], [place] [address] [place],  [address]    Report Status 06/19/2018 FINAL  Final  Surgical pcr screen     Status: Abnormal   Collection Time: 06/15/18 10:24 PM  Result Value Ref Range Status   MRSA, PCR NEGATIVE NEGATIVE Final   Staphylococcus aureus POSITIVE (A) NEGATIVE Final    Comment: (NOTE) The Xpert SA Assay (FDA approved for NASAL specimens in patients 22 years of  age and older), is one component of a comprehensive surveillance program. It is not intended to diagnose infection nor to guide or monitor treatment. Performed at [facility], [place] [address], [place], [place] [address]   Aerobic/Anaerobic Culture (surgical/deep wound)     Status: None (Preliminary result)   Collection Time: 06/16/18  6:29 PM  Result Value Ref Range Status   Specimen Description   Final    ABSCESS Performed at [facility] [address] [place], [place] [address]    Special Requests   Final    NONE Performed at [facility], [place] [address], [place], [place] [address]    Gram Stain   Final    RARE WBC PRESENT, PREDOMINANTLY PMN NO ORGANISMS SEEN Performed at [facility], [place] [address] [place], [place] [address]    Culture   Final    FEW PROTEUS MIRABILIS RARE GROUP B STREP(S.AGALACTIAE)ISOLATED CRITICAL RESULT CALLED TO, READ BACK BY AND VERIFIED WITH: RN AMANDA LAWSON 06/18/18 AT 1015 BY CM NO ANAEROBES ISOLATED; CULTURE IN PROGRESS FOR 5 DAYS    Report Status PENDING  Incomplete   Organism ID, Bacteria PROTEUS MIRABILIS  Final      Susceptibility   Proteus mirabilis - MIC*    AMPICILLIN <=2 SENSITIVE Sensitive     CEFAZOLIN <=4 SENSITIVE Sensitive     CEFEPIME <=1 SENSITIVE Sensitive     CEFTAZIDIME <=1 SENSITIVE Sensitive     CEFTRIAXONE <=1 SENSITIVE Sensitive     CIPROFLOXACIN <=0.25 SENSITIVE Sensitive     GENTAMICIN <=1 SENSITIVE Sensitive     IMIPENEM 8 INTERMEDIATE Intermediate     TRIMETH/SULFA <=20 SENSITIVE Sensitive     AMPICILLIN/SULBACTAM <=2 SENSITIVE Sensitive     PIP/TAZO <=4 SENSITIVE Sensitive     * FEW PROTEUS MIRABILIS  Aerobic/Anaerobic Culture (surgical/deep wound)     Status: None (Preliminary result)   Collection Time: 06/16/18  6:53 PM  Result Value Ref Range Status   Specimen Description   Final    BONE Performed at [facility] [address] [place], [place] [address]    Special Requests   Final    METATARSAL HEAD EXCISION Performed at [facility], [place] [address], [place], [place] [address]     Gram Stain   Final    RARE WBC PRESENT, PREDOMINANTLY PMN NO ORGANISMS SEEN Performed at [facility], [place] [address], [place], [place] [address]    Culture   Final    RARE PROTEUS MIRABILIS RARE STAPHYLOCOCCUS EPIDERMIDIS CRITICAL RESULT CALLED TO, READ BACK BY AND VERIFIED WITH: RN AMANDA LAWSON 06/18/18 AT 1012 BY CM NO ANAEROBES ISOLATED; CULTURE IN PROGRESS FOR 5 DAYS    Report Status PENDING  Incomplete   Organism ID, Bacteria PROTEUS MIRABILIS  Final   Organism ID, Bacteria STAPHYLOCOCCUS EPIDERMIDIS  Final      Susceptibility   Proteus mirabilis - MIC*    AMPICILLIN <=2 SENSITIVE Sensitive     CEFAZOLIN <=4 SENSITIVE Sensitive     CEFEPIME <=1 SENSITIVE Sensitive     CEFTAZIDIME <=1 SENSITIVE Sensitive     CEFTRIAXONE <=1 SENSITIVE Sensitive     CIPROFLOXACIN <=0.25 SENSITIVE Sensitive     GENTAMICIN <=1 SENSITIVE Sensitive     IMIPENEM 2 SENSITIVE Sensitive     TRIMETH/SULFA <=20 SENSITIVE Sensitive     AMPICILLIN/SULBACTAM <=2 SENSITIVE Sensitive     PIP/TAZO <=4 SENSITIVE Sensitive     * RARE  PROTEUS MIRABILIS   Staphylococcus epidermidis - MIC*    CIPROFLOXACIN 4 RESISTANT Resistant     ERYTHROMYCIN >=8 RESISTANT Resistant     GENTAMICIN <=0.5 SENSITIVE Sensitive     OXACILLIN >=4 RESISTANT Resistant     TETRACYCLINE 2 SENSITIVE Sensitive     VANCOMYCIN 1 SENSITIVE Sensitive     TRIMETH/SULFA <=10 SENSITIVE Sensitive     CLINDAMYCIN >=8 RESISTANT Resistant     RIFAMPIN <=0.5 SENSITIVE Sensitive     Inducible Clindamycin NEGATIVE Sensitive     * RARE STAPHYLOCOCCUS EPIDERMIDIS   Time coordinating discharge: 35 minutes  SIGNED:  Kerney Elbe, DO [facility] 06/20/2018, 4:57 PM Pager [phone]  If 7PM-7AM, please contact night-coverage [email or web address] Password [email or web address]

## 2018-06-21 ENCOUNTER — Telehealth: Payer: Self-pay | Admitting: Podiatry

## 2018-06-21 NOTE — Telephone Encounter (Signed)
It needs to be changed daily. I showed the daughter in law in the hospital how to do this and home health will only come out 3 times a week. I will change the dressing tomorrow but if it has not been changed today then the daughter in law needs to change it.

## 2018-06-21 NOTE — Telephone Encounter (Signed)
I called the pt back and let her know that per Dr. Jacqualyn Posey bandage needs to be changed daily. Pt states Home Health has down that it is to be changed every other day. Stated she was told someone would come out in the morning so should she have her daughter in law change the bandage today?

## 2018-06-21 NOTE — Telephone Encounter (Signed)
I gave pt the orders from Dr. Jacqualyn Posey and she states she will call her dtr-in-law to come by to change on the way home from work and [facility] will come in tomorrow morning to get her established in their facility.

## 2018-06-21 NOTE — Telephone Encounter (Signed)
Called pt per [name] request to get her rescheduled to see him tomorrow, Tuesday 06 August from Dr. Mellody Drown schedule on Thursday 08 August. I got pt scheduled to see Dr. Jacqualyn Posey tomorrow at 3:15 pm. I asked her if anyone from [facility] has come out to change her bandage yet to which the pt replied no. She stated her daughter changed her bandage yesterday. Stated they called the number for the nurse they had [place] and [place] would be there this afternoon/evening to change her bandage. Pt wanted to confirm with Dr. Jacqualyn Posey if her bandage is supposed to be changed daily? I told her we would call her back with that information after speaking with Dr. Jacqualyn Posey.

## 2018-06-22 ENCOUNTER — Ambulatory Visit (INDEPENDENT_AMBULATORY_CARE_PROVIDER_SITE_OTHER): Payer: Medicare Other

## 2018-06-22 ENCOUNTER — Ambulatory Visit (INDEPENDENT_AMBULATORY_CARE_PROVIDER_SITE_OTHER): Payer: Medicare Other | Admitting: Podiatry

## 2018-06-22 ENCOUNTER — Encounter: Payer: Self-pay | Admitting: Podiatry

## 2018-06-22 VITALS — Temp 97.8°F

## 2018-06-22 DIAGNOSIS — L97519 Non-pressure chronic ulcer of other part of right foot with unspecified severity: Secondary | ICD-10-CM

## 2018-06-22 LAB — AEROBIC/ANAEROBIC CULTURE (SURGICAL/DEEP WOUND)

## 2018-06-22 LAB — AEROBIC/ANAEROBIC CULTURE W GRAM STAIN (SURGICAL/DEEP WOUND)

## 2018-06-22 NOTE — Patient Instructions (Signed)
Change the bandage daily. Flush wound with saline. Pack with the corner of a saline soaked 4 x 4 dressing. Apply a nonadherent dressing the the incisions followed by gauze and an ACE bandage.  Continue antibiotics  ----     Cellulitis, Adult Cellulitis is a skin infection. The infected area is usually red and sore. This condition occurs most often in the arms and lower legs. It is very important to get treated for this condition. Follow these instructions at home:  Take over-the-counter and prescription medicines only as told by your doctor.  If you were prescribed an antibiotic medicine, take it as told by your doctor. Do not stop taking the antibiotic even if you start to feel better.  Drink enough fluid to keep your pee (urine) clear or pale yellow.  Do not touch or rub the infected area.  Raise (elevate) the infected area above the level of your heart while you are sitting or lying down.  Place warm or cold wet cloths (warm or cold compresses) on the infected area. Do this as told by your doctor.  Keep all follow-up visits as told by your doctor. This is important. These visits let your doctor make sure your infection is not getting worse. Contact a doctor if:  You have a fever.  Your symptoms do not get better after 1-2 days of treatment.  Your bone or joint under the infected area starts to hurt after the skin has healed.  Your infection comes back. This can happen in the same area or another area.  You have a swollen bump in the infected area.  You have new symptoms.  You feel ill and also have muscle aches and pains. Get help right away if:  Your symptoms get worse.  You feel very sleepy.  You throw up (vomit) or have watery poop (diarrhea) for a long time.  There are red streaks coming from the infected area.  Your red area gets larger.  Your red area turns darker. This information is not intended to replace advice given to you by your health care  provider. Make sure you discuss any questions you have with your health care provider. Document Released: 04/21/2008 Document Revised: 04/10/2016 Document Reviewed: 09/12/2015 Elsevier Interactive Patient Education  2018 [employer].

## 2018-06-23 ENCOUNTER — Telehealth: Payer: Self-pay | Admitting: Podiatry

## 2018-06-23 DIAGNOSIS — L97519 Non-pressure chronic ulcer of other part of right foot with unspecified severity: Secondary | ICD-10-CM | POA: Diagnosis not present

## 2018-06-23 LAB — AEROBIC/ANAEROBIC CULTURE W GRAM STAIN (SURGICAL/DEEP WOUND)

## 2018-06-23 LAB — AEROBIC/ANAEROBIC CULTURE (SURGICAL/DEEP WOUND)

## 2018-06-23 NOTE — Telephone Encounter (Signed)
This is Sara Graves with [facility]. Dr. Jacqualyn Posey referred Sara Graves for home health care for wound care. We will be seeing Sara Graves twice a week. She will be getting PT and OT. If you have any questions you can reach me at [phone]. Thank you.

## 2018-06-24 ENCOUNTER — Ambulatory Visit: Payer: Medicare Other | Admitting: Podiatry

## 2018-06-24 ENCOUNTER — Telehealth: Payer: Self-pay | Admitting: Podiatry

## 2018-06-24 NOTE — Telephone Encounter (Signed)
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 Calling to get verbal authorization for OT for 1 x a week for 3 weeks. We are also requesting verification of her status as non weight bearing for right lower leg. My number for authorization is [phone]. Thank you.

## 2018-06-25 ENCOUNTER — Ambulatory Visit (INDEPENDENT_AMBULATORY_CARE_PROVIDER_SITE_OTHER): Payer: Medicare Other

## 2018-06-25 ENCOUNTER — Telehealth: Payer: Self-pay | Admitting: *Deleted

## 2018-06-25 DIAGNOSIS — L97519 Non-pressure chronic ulcer of other part of right foot with unspecified severity: Secondary | ICD-10-CM

## 2018-06-25 DIAGNOSIS — L02619 Cutaneous abscess of unspecified foot: Secondary | ICD-10-CM

## 2018-06-25 DIAGNOSIS — L03119 Cellulitis of unspecified part of limb: Secondary | ICD-10-CM

## 2018-06-25 NOTE — Telephone Encounter (Signed)
She is nonweightbearing. OK for OT to come out as well.

## 2018-06-25 NOTE — Telephone Encounter (Signed)
Faxed required form, clinicals and demographics to Infectious disease.

## 2018-06-25 NOTE — Telephone Encounter (Signed)
-----   Message from Trula Slade, DPM sent at 06/24/2018  4:58 PM EDT ----- Tivis Ringer- can you get her an appointment with Infectious Disease?

## 2018-06-25 NOTE — Telephone Encounter (Signed)
I informed Will, OT - [facility] of [name] orders.

## 2018-06-28 NOTE — Progress Notes (Signed)
Subjective: Sara Graves is a 77 y.o. is seen today in office s/p right foot fifth metatarsal head excision, incision and drainage preformed on 06/16/2018. They state their pain is improving.  She states that home health did come to the hospital he had no orders.  I have given multiple orders over the phone to the hospitalist as well as regular note that we need to be done.  She states that her daughter-in-law has been changing the bandage daily but has not been flushing the wound doing it as prescribed as she has minimal supplies.  She is on doxycycline, Augmentin per the infectious disease recommendations.  Denies any systemic complaints such as fevers, chills, nausea, vomiting. No calf pain, chest pain, shortness of breath.   Objective: General: No acute distress, AAOx3  DP/PT pulses palpable 2/4, CRT < 3 sec to all digits.  Protective sensation intact. Motor function intact.  Right foot: Incision on the dorsal aspect the right foot dorsal skin breakdown.  There is a small amount of purulence identified from the wound submetatarsal 5 area today.  There does appear to be mild increase in erythema to the dorsal lateral aspect of foot compared to what it was my last saw her in the hospital and there is mild increase in warmth and there is mild swelling to the foot.  There is no fluctuation or crepitation.  There is no malodor. No other open lesions or pre-ulcerative lesions.  No pain with calf compression, swelling, warmth, erythema.   Assessment and Plan:  Status post right fifth metatarsal head excision with mild recurrence of cellulitis  -Treatment options discussed including all alternatives, risks, and complications -Today I did clean the wound thoroughly irrigated with saline.  I packed corner of the saline 4 x 4 into the wound followed by dry dressing.  I want her to do the same dressing changes at home I gave her several wound care supplies today.  I have also written orders again today for  this.  If any questions or concerns I encouraged her, her daughter-in-law or the home health agency will call me. -Continue cam boot and nonweightbearing. -Continue doxycycline, Augmentin.  Will get infectious disease consultation as well. -Ice/elevation -Pain medication as needed. -Monitor for any clinical signs or symptoms of infection and DVT/PE and directed to call the office immediately should any occur or go to the ER. -Follow-up on Friday with [name] for a wound check or sooner if any problems arise. In the meantime, encouraged to call the office with any questions, concerns, change in symptoms.   Celesta Gentile, DPM

## 2018-06-29 ENCOUNTER — Ambulatory Visit (INDEPENDENT_AMBULATORY_CARE_PROVIDER_SITE_OTHER): Payer: Medicare Other | Admitting: Podiatry

## 2018-06-29 ENCOUNTER — Encounter: Payer: Self-pay | Admitting: Podiatry

## 2018-06-29 ENCOUNTER — Telehealth: Payer: Self-pay | Admitting: Podiatry

## 2018-06-29 DIAGNOSIS — L97519 Non-pressure chronic ulcer of other part of right foot with unspecified severity: Secondary | ICD-10-CM

## 2018-06-29 DIAGNOSIS — L03119 Cellulitis of unspecified part of limb: Secondary | ICD-10-CM

## 2018-06-29 DIAGNOSIS — L02619 Cutaneous abscess of unspecified foot: Secondary | ICD-10-CM

## 2018-06-29 MED ORDER — FLUCONAZOLE 150 MG PO TABS: 150.0000 mg | ORAL_TABLET | Freq: Once | ORAL | 0 refills | Status: AC

## 2018-06-29 NOTE — Telephone Encounter (Signed)
Spoke with Will verify discontinuation of OT services.

## 2018-06-29 NOTE — Progress Notes (Signed)
Patient presents today for follow-up visit.  She is status post right foot fifth metatarsal head excision, incision and drainage performed on 06/16/2018.  She states that overall she is feeling much better and her pain is improving.   Right foot: Incision on the dorsal aspect foot appears to be healing well, all sutures are intact, no gapping no open areas.  Wound sub-fifth met has some small amount of serosanguineous fluid draining, but no redness, no erythema, no other signs and symptoms of infection.  No malodor to drainage, mild swelling.  Vital signs are stable blood pressure 127/72, temperature 98.2.  She denies fever chills nausea vomiting. The area has overall made a great deal of improvement, and symptoms of infection are resolving.  Dressed foot with sterile wet-to-dry bandage.  Patient was given sterile saline and instructed on how to do this at home.      Discussed signs and symptoms of infection and importance of reporting symptoms.  She is to remain in her cam boot nonweightbearing.  Dressing changes are to be normal saline wet-to-dry daily.  She is to follow-up next week with Dr. Jacqualyn Posey or sooner if any acute symptoms arise.

## 2018-06-29 NOTE — Telephone Encounter (Signed)
This is Will, OT with [facility]. Sara Graves is stating that she does not want to continue with occupational therapy services. She is also stating that Dr. Jacqualyn Posey gave her authorization to discontinue occupational therapy. We just wanted to make sure that was okay with the doctor. If you have any questions or concerns, my number is [phone]. Thank you.

## 2018-06-30 NOTE — Progress Notes (Signed)
Subjective: Sara Graves is a 77 y.o. is seen today in office s/p right foot fifth metatarsal head excision, incision and drainage preformed on 06/16/2018.  She is continued on the antibiotics without any problems.  Her daughter-in-law has been changing the bandage and also home health has been coming out just recently to start dressing changes as well.  She states that the foot is looking better her family thinks that was well and she denies any systemic complaints including fevers, chills, nausea, vomiting.  No coughing, chest pain, shortness of breath.  She states that the swelling and the redness is much improved.  She is taking 1 pain medicine pills since she left the hospital.  Objective: General: No acute distress, AAOx3  DP/PT pulses palpable 2/4, CRT < 3 sec to all digits.  Protective sensation intact. Motor function intact.  Right foot: Incision on the dorsal lateral aspect the foot appears to be healing well.  Ulceration does continue to plantar aspect submetatarsal 5 however it is starting to show and there is granulation tissue.  There is no significant erythema in the sinus appears to be almost completely resolved at this point.  There is no fluctuation or crepitation.  Minimal swelling. No pain.  No other open lesions or pre-ulcerative lesions. No pain with calf compression, swelling, warmth, erythema.   Assessment and Plan:  Status post right fifth metatarsal head excision with improvement of cellulitis  -Treatment options discussed including all alternatives, risks, and complications -Overall she is doing well.  I did clean the wound today and flushed with saline and I packed the wound followed by dry sterile dressing.  She is to continue with similar dressing changes at home.  Continue antibiotics for at least 4 to 6 weeks and we are awaiting infectious disease consultation.  Continue nonweightbearing.  She has put minimal weight to her foot.  Monitoring signs or symptoms of  infection call the office and they should any occur.  I will follow-up with her in 1 week or sooner if any issues are to arise.  Trula Slade DPM

## 2018-07-06 ENCOUNTER — Ambulatory Visit: Payer: Medicare Other | Admitting: Podiatry

## 2018-07-06 DIAGNOSIS — L03119 Cellulitis of unspecified part of limb: Secondary | ICD-10-CM

## 2018-07-06 DIAGNOSIS — L97519 Non-pressure chronic ulcer of other part of right foot with unspecified severity: Secondary | ICD-10-CM

## 2018-07-06 DIAGNOSIS — L02619 Cutaneous abscess of unspecified foot: Secondary | ICD-10-CM

## 2018-07-07 NOTE — Progress Notes (Signed)
Subjective: PRAIRIE Graves is a 77 y.o. is seen today in office s/p right foot fifth metatarsal head excision, incision and drainage preformed on 06/16/2018.  She states that overall she is doing well she is not having any pain she has not been taking any pain medication.  Her daughter-in-law does change the bandage in between home health agency does not come out.  She has been nonweightbearing as much as possible and overall she is doing better she denies any swelling or redness or drainage or pus coming from the wound.  She has no other concerns today.  She denies any fevers, chills, nausea, vomiting.  No calf pain chest pain shortness of breath.  Objective: General: No acute distress, AAOx3  DP/PT pulses palpable 2/4, CRT < 3 sec to all digits.  Protective sensation intact. Motor function intact.  Right foot: Incision on the dorsal lateral aspect the foot appears to be healing well.  Ulceration continues in the submetatarsal 5 area but still probing but there is no probing to bone.  Her daughter-in-law is packing the wound quite a bit.  There is no drainage or pus tooth no surrounding erythema there is no ascending cellulitis.  There is no fluctuation or crepitation or any malodor. No pain with calf compression, swelling, warmth, erythema.   Assessment and Plan:  Status post right fifth metatarsal head excision with improvement of cellulitis  -Treatment options discussed including all alternatives, risks, and complications -We will continue antibiotics.  She has an appoint with infectious disease next week.  The wound was flushed with saline and was packed with iodoform packing followed by dry sterile dressing.  Switch home health orders to do this but for now she can continue with the 4 x 4 dressing into the wound.  Discussed with her daughter daughter-in-law noted that the packing wound is much to allow for healing. -Monitor for any clinical signs or symptoms of infection and directed to call the  office immediately should any occur or go to the ER.  *x-ray next appointment   Trula Slade DPM

## 2018-07-08 ENCOUNTER — Telehealth: Payer: Self-pay | Admitting: *Deleted

## 2018-07-08 NOTE — Telephone Encounter (Signed)
-----   Message from Trula Slade, DPM sent at 07/07/2018  4:14 PM EDT ----- Currently switched home health orders to flush the wound with saline, packed the wound with quarter inch iodoform packing followed by 4 x 4 dressings and Kerlix, Ace.

## 2018-07-08 NOTE — Telephone Encounter (Signed)
Faxed orders to [facility].

## 2018-07-13 ENCOUNTER — Ambulatory Visit (INDEPENDENT_AMBULATORY_CARE_PROVIDER_SITE_OTHER): Payer: Self-pay | Admitting: Podiatry

## 2018-07-13 ENCOUNTER — Ambulatory Visit (INDEPENDENT_AMBULATORY_CARE_PROVIDER_SITE_OTHER): Payer: Medicare Other

## 2018-07-13 DIAGNOSIS — L97519 Non-pressure chronic ulcer of other part of right foot with unspecified severity: Secondary | ICD-10-CM

## 2018-07-16 ENCOUNTER — Encounter: Payer: Self-pay | Admitting: Infectious Diseases

## 2018-07-16 ENCOUNTER — Ambulatory Visit (INDEPENDENT_AMBULATORY_CARE_PROVIDER_SITE_OTHER): Payer: Medicare Other | Admitting: Infectious Diseases

## 2018-07-16 VITALS — BP 180/75 | HR 49 | Temp 97.5°F

## 2018-07-16 DIAGNOSIS — L97309 Non-pressure chronic ulcer of unspecified ankle with unspecified severity: Secondary | ICD-10-CM | POA: Diagnosis not present

## 2018-07-16 DIAGNOSIS — L03115 Cellulitis of right lower limb: Secondary | ICD-10-CM | POA: Diagnosis not present

## 2018-07-16 DIAGNOSIS — L97509 Non-pressure chronic ulcer of other part of unspecified foot with unspecified severity: Secondary | ICD-10-CM

## 2018-07-16 DIAGNOSIS — E1140 Type 2 diabetes mellitus with diabetic neuropathy, unspecified: Secondary | ICD-10-CM

## 2018-07-16 DIAGNOSIS — Z794 Long term (current) use of insulin: Secondary | ICD-10-CM

## 2018-07-16 DIAGNOSIS — E11622 Type 2 diabetes mellitus with other skin ulcer: Secondary | ICD-10-CM

## 2018-07-16 DIAGNOSIS — E11621 Type 2 diabetes mellitus with foot ulcer: Secondary | ICD-10-CM

## 2018-07-16 MED ORDER — DOXYCYCLINE HYCLATE 100 MG PO TABS: 100.0000 mg | ORAL_TABLET | Freq: Two times a day (BID) | ORAL | 0 refills | Status: AC

## 2018-07-16 MED ORDER — AMOXICILLIN-POT CLAVULANATE 875-125 MG PO TABS: 1.0000 | ORAL_TABLET | Freq: Two times a day (BID) | ORAL | 0 refills | Status: AC

## 2018-07-16 NOTE — Assessment & Plan Note (Addendum)
Resolved with antibiotic treatment 

## 2018-07-16 NOTE — Assessment & Plan Note (Signed)
Blood sugars are under good control per her report.

## 2018-07-16 NOTE — Patient Instructions (Addendum)
Wonderful to meet you!  I have refilled your antibiotics for another 14 days to continue. If you continue to have significant improvement over the next 1 week would  Consider stopping early.   Please continue to see you podiatry team and if concerns arise we are happy to see you back.

## 2018-07-16 NOTE — Progress Notes (Signed)
Patient: Sara Graves  DOB: 11/03/1941 MRN: [id number] PCP: Selinda Orion  Referring Provider: [name], PA-C    Patient Active Problem List   Diagnosis Date Noted  . Dizziness 06/18/2018  . 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 Acute exacerbation of congestive heart failure ([place]) 04/25/2018  . NSTEMI (non-ST elevated myocardial infarction) ([place]) 04/25/2018  . Shortness of breath 12/27/2017  . Abdominal pain   . Vomiting 03/24/2015  . Essential hypertension 03/24/2015  . Thrombocytopenia ([place]) 08/31/2014  . Protein-calorie malnutrition, severe ([place]) 05/16/2014  . Unspecified vitamin D deficiency 05/09/2014  . Osteopenia 04/06/2014  . Neuropathy of leg 04/27/2013  . Weight loss, abnormal 03/16/2013  . Left rotator cuff tear 12/29/2012  . Baker's cyst 04/09/2012  . UNSPECIFIED VENOUS INSUFFICIENCY 09/20/2010  . LACTOSE INTOLERANCE 01/23/2009  . Depression 02/04/2007  . Hypothyroidism 01/14/2007  . DM (diabetes mellitus), type 2, uncontrolled ([place]) 01/14/2007  . HYPERCHOLESTEROLEMIA 01/14/2007  . HYPERTENSION, BENIGN SYSTEMIC 01/14/2007  . RHINITIS, ALLERGIC 01/14/2007  . ARTHRITIS 01/14/2007  . SCIATICA 01/14/2007     Subjective:   Chief Complaint  Patient presents with  . New Patient (Initial Visit)    diabetic foot infection     Sara Graves is a 77 y.o. diabetic female here today for evaluation of her right plantar foot neuropathic ulcer. She tells me that this ulcer has caused her trouble for about a year. On 7-29 she underwent a fifth metatarsal head resection by Dr. Jacqualyn Posey. Per the operative note there was a heavy amount of purulence along the dorsal site. Intraoperative bone cultures revealed proteus mirabilis, group b streptococcus and staphylococcus epidermidis. She has been taking augmentin and doxycycline twice daily for 4 weeks now. Tells me that over the last 7 days she has had some quick  improvement. All sutures have now been removed and there has been an overall improvement with regard to the redness. She is still strict non-weight bearing and feels "numb." She has neuropathy at baseline but notices that she is more numb than usual. Denies any fevers or chills. Still has some small serous drainage from the lateral incision site from a small hole. When the plantar ulcer is irrigated with pulsed saline it does squirt out of this side as well. She is tolerating the antibiotics well. Only has had one day of stomach cramping but no other trouble. She is not taking it consistently with food.   Review of Systems  Constitutional: Negative for chills, fever, malaise/fatigue and weight loss.  HENT: Negative for sore throat.        No dental problems  Respiratory: Negative for cough and sputum production.   Cardiovascular: Positive for leg swelling (baseline). Negative for chest pain.  Gastrointestinal: Negative for diarrhea and vomiting. Abdominal pain: once but resolved.  Genitourinary: Negative for dysuria and flank pain.  Musculoskeletal: Negative for joint pain, myalgias and neck pain.  Skin: Negative for rash.  Neurological: Negative for dizziness, tingling and headaches.  Psychiatric/Behavioral: Negative for depression and substance abuse. The patient is not nervous/anxious and does not have insomnia.     Past Medical History:  Diagnosis Date  . Anxiety   . Arthritis    "hands" (12/29/2017)  . Basal cell carcinoma of nose    removed  . Bursitis of left shoulder   . Cat scratch fever    Late 90s  . Coronary artery disease   . Depression   .  Diabetes mellitus, type II, insulin dependent ([place])   . GERD (gastroesophageal reflux disease)   . H. pylori infection 2008 and 1998   treated  . Hyperlipidemia   . Hypertension   . Hypothyroidism   . Multinodular goiter   . Rotator cuff tear, left recurrent   . Urge urinary incontinence   . UTI (lower urinary tract infection)  05/2016    Outpatient Medications Prior to Visit  Medication Sig Dispense Refill  . amLODipine (NORVASC) 5 MG tablet Take 1 tablet (5 mg total) by mouth daily. 60 tablet 1  . aspirin (BAYER CHILDRENS ASPIRIN) 81 MG chewable tablet Chew 81 mg by mouth daily.     . busPIRone (BUSPAR) 7.5 MG tablet Take 7.5 mg by mouth 2 (two) times daily.    . Calcium Carb-Cholecalciferol (CALCIUM 600 + D PO) Take 1 tablet by mouth daily.    . dapagliflozin propanediol (FARXIGA) 10 MG TABS tablet Take 10 mg by mouth daily.     Marland Kitchen ezetimibe (ZETIA) 10 MG tablet Take 10 mg by mouth daily.    Marland Kitchen HUMALOG 100 UNIT/ML injection Inject 56 Units into the skin See admin instructions. For [place] e with V - GO 20 Insulin Delivery Device. Total : 56 Units / Day.    . Investigational - Study Medication Take 1 tablet by mouth daily. Study name: dapagliflozin/metformin ER 10/1000mg  Additional study details: This is a Drug Study medication from Dr. Nadyne Coombes at [facility]. Patient started taking this medication on 04/22/18 and is unsure of how long she is to take this medication for. Per patient, she is on this medication as part of a Heart and Diabetes drug study. 1 each PRN  . isosorbide mononitrate (IMDUR) 60 MG 24 hr tablet Take 60 mg by mouth daily.    Marland Kitchen labetalol (NORMODYNE) 100 MG tablet Take 100 mg by mouth 2 (two) times daily.    Marland Kitchen levothyroxine (SYNTHROID, LEVOTHROID) 50 MCG tablet Take 50 mcg by mouth every morning.     . nitroGLYCERIN (NITROSTAT) 0.4 MG SL tablet Place 1 tablet (0.4 mg total) under the tongue every 5 (five) minutes x 3 doses as needed for chest pain. 30 tablet 1  . Omega-3 Fatty Acids (FISH OIL) 1000 MG CAPS Take 1,000 mg by mouth 2 (two) times daily. OTC fish oil    . oxyCODONE (OXY IR/ROXICODONE) 5 MG immediate release tablet Take 1 tablet (5 mg total) by mouth every 6 (six) hours as needed for moderate pain. 10 tablet 0  . pantoprazole (PROTONIX) 40 MG tablet Take 1 tablet (40 mg total) by mouth 2  (two) times daily. 60 tablet 1  . potassium chloride SA (KLOR-CON M20) 20 MEQ tablet Take 1 tablet (20 mEq total) by mouth 2 (two) times daily. With Torsemide as needed for leg swelling 60 tablet 0  . prasugrel (EFFIENT) 10 MG TABS tablet Take 1 tablet (10 mg total) by mouth daily. 30 tablet 6  . rosuvastatin (CRESTOR) 40 MG tablet Take 1 tablet (40 mg total) by mouth at bedtime. 60 tablet 3  . torsemide (DEMADEX) 20 MG tablet Take 20 mg by mouth daily as needed (leg edema).   3  . Vitamin D, Ergocalciferol, (DRISDOL) 50000 units CAPS capsule Take 50,000 Units by mouth every Thursday.     Marland Kitchen amoxicillin-clavulanate (AUGMENTIN) 875-125 MG tablet Take 1 tablet by mouth every 12 (twelve) hours. 60 tablet 0  . doxycycline (VIBRA-TABS) 100 MG tablet Take 1 tablet (100 mg total) by mouth every  12 (twelve) hours. 60 tablet 0  . labetalol (NORMODYNE) 300 MG tablet Take 1 tablet (300 mg total) by mouth 2 (two) times daily. (Patient not taking: Reported on 07/16/2018)     No facility-administered medications prior to visit.      Allergies  Allergen Reactions  . Rosiglitazone Maleate Anaphylaxis and Swelling  . Morphine Other (See Comments)    SEVERE HYPOTENSION   . Cephalexin Diarrhea  . Sulfa Antibiotics Diarrhea and Nausea And Vomiting  . Elavil [Amitriptyline] Nausea Only  . Tramadol-Acetaminophen Rash    Social History   Tobacco Use  . Smoking status: Never Smoker  . Smokeless tobacco: Never Used  Substance Use Topics  . Alcohol use: No  . Drug use: No    Family History  Problem Relation Age of Onset  . Cancer Mother        breast  . Heart Problems Mother   . Prostate cancer Father   . Muscular dystrophy Son   . Cancer Maternal Grandmother        breast  . Heart disease Maternal Grandfather   . Breast cancer Sister     Objective:   Vitals:   07/16/18 1027  BP: (!) 180/75  Pulse: (!) 49  Temp: (!) 97.5 F (36.4 C)   There is no height or weight on file to calculate  BMI.  Physical Exam  Constitutional: She is oriented to person, place, and time. She appears well-developed and well-nourished.  Very pleasant elderly woman seated comfortably in her wheelchair. Well dressed and appears well today.   HENT:  Mouth/Throat: Mucous membranes are normal. No oral lesions. Normal dentition. No dental abscesses. No oropharyngeal exudate.  Cardiovascular: Normal heart sounds. An irregularly irregular rhythm present. Bradycardia present.  Pulmonary/Chest: Effort normal and breath sounds normal. No respiratory distress. She has no rales.  Abdominal: Soft. She exhibits no distension. There is no tenderness.  Neurological: She is alert and oriented to person, place, and time. A sensory deficit (peripheral neuoropathy, R>L) is present.  Skin: Skin is warm and dry. No rash noted.  Psychiatric: She has a normal mood and affect. Judgment normal.  In good spirits today and engaged in care discussion  Vitals reviewed.  Right Plantar Foot demonstrates small < 0.25cm open ulcer with surrounding yellow/calloused skin. No drainage. No crepitus, pain or drainage with palpation. No periwound erythema or induration.    Right lateral surgical incision: small red serous drainage from one small pinhole area at distal incision. Otherwise all is well approximated and healed over nicely. Very mild diffuse pink tinged sking. No periwound heat or induration.      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 I am very pleased with how she is doing. She still has a small sinus tract that communicates with a pinhole incision from the plantar wound - would continue for another 14 days with dual therapy Doxycycline + Augmentin. This will give her 6 weeks of therapy. Bone biopsy and CT scan all without definitive evidence for osteomyelitis and suspicious bone has been resected. Likely the staph epi is not playing much of a role here being a common skin organism so if she does have abdominal pain/nausea would stop the doxy and continue augmentin alone. Could consider stopping after 7 more days if she continues to show rapid improvement.        DM (diabetes mellitus), type 2, uncontrolled ([place])    Blood sugars are under good control per her report.        Musculoskeletal and Integument   RESOLVED: Cellulitis of right foot    Resolved with antibiotic treatment.         Follow up with podiatry team with regular wound care. Happy to see her back should things look like they are taking a step in the wrong direction.   Janene Madeira, MSN, NP-C [facility] [place] Pager: [phone] Office: [phone]  07/16/18  2:57 PM

## 2018-07-16 NOTE — Assessment & Plan Note (Addendum)
She has shown some good improvement over the last 1 in looking at the pictures documented in her chart. I am very pleased with how she is doing. She still has a small sinus tract that communicates with a pinhole incision from the plantar wound - would continue for another 14 days with dual therapy Doxycycline + Augmentin. This will give her 6 weeks of therapy. Bone biopsy and CT scan all without definitive evidence for osteomyelitis and suspicious bone has been resected. Likely the staph epi is not playing much of a role here being a common skin organism so if she does have abdominal pain/nausea would stop the doxy and continue augmentin alone. Could consider stopping after 7 more days if she continues to show rapid improvement.

## 2018-07-17 NOTE — Progress Notes (Signed)
Subjective: Sara Graves is a 77 y.o. is seen today in office s/p right foot fifth metatarsal head excision, incision and drainage preformed on 06/16/2018.  She states that she is doing well she is still not having any pain to her foot.  She states that the home health nurse comes twice a week to change her pain is and her daughter-in-law changes at the other times.  Overall she states the foot is looking much better.  She has been trying remain nonweightbearing much as possible and did not take any pain medication.  She is continue the antibiotics and she has an upcoming appointment with infectious disease.  She has no other concerns today.  Objective: General: No acute distress, AAOx3  DP/PT pulses palpable 2/4, CRT < 3 sec to all digits.  Protective sensation intact. Motor function intact.  Right foot: Incision on the dorsal lateral aspect the foot appears to be healing well.  Sutures are intact ulceration continues in the submetatarsal 5 area but still probing but there is no probing to bone.  The wound base is granular.  There is no drainage or pus identified and there is no significant erythema or ascending sialitis.  Is no fluctuation or crepitation or any malodor. No other open lesions are identified. No pain with calf compression, swelling, warmth, erythema.     Assessment and Plan:  Status post right fifth metatarsal head excision with improvement of cellulitis  -Treatment options discussed including all alternatives, risks, and complications -I remove the sutures today without any complications. I also debrided some of the hyperkeratotic tissue around the area.  -I cleansed the wound today.  Plan continue with iodoform packing dressing changes daily.  Continue antibiotics per infectious disease and she has an upcoming appointment later this week.  Continue recommend nonweightbearing elevation. -Monitor for any clinical signs or symptoms of infection and directed to call the office  immediately should any occur or go to the ER.  Return in about 1 week (around 07/20/2018).  Trula Slade DPM

## 2018-07-20 ENCOUNTER — Ambulatory Visit (INDEPENDENT_AMBULATORY_CARE_PROVIDER_SITE_OTHER): Payer: Medicare Other | Admitting: Podiatry

## 2018-07-20 ENCOUNTER — Other Ambulatory Visit: Payer: Self-pay

## 2018-07-20 DIAGNOSIS — L97519 Non-pressure chronic ulcer of other part of right foot with unspecified severity: Secondary | ICD-10-CM

## 2018-07-24 NOTE — Progress Notes (Signed)
Subjective: Sara Graves is a 77 y.o. is seen today in office s/p right foot fifth metatarsal head excision, incision and drainage preformed on 06/16/2018.  She is still with oral antibiotics.  She states that the wound looks fantastic and she is doing much not having any pain.  She denies any drainage or pus and denies any increase in redness or swelling to the foot.  She has no other concerns today and she denies any systemic complaints including fevers, chills, nausea, vomiting.  Denies any calf pain, chest pain, shortness of breath.  She has no other concerns today.  Objective: General: No acute distress, AAOx3  DP/PT pulses palpable 2/4, CRT < 3 sec to all digits.  Protective sensation intact. Motor function intact.  Right foot: Incision on the dorsal lateral aspect the foot appears to be healing well.  The dorsal incision appears to be healed.  On the submetatarsal 5 where the previous wound there is a hyperkeratotic lesion over the area.  Upon opening is very minimal, few drops of purulence upon debridement there is no further signs of infection.  Still a wound on the plantar aspect submetatarsal 5 but overall appears to be almost healed.  There is no edema, erythema there is no fluctuation or crepitation.  No ascending sialitis. No other open lesions are identified. No pain with calf compression, swelling, warmth, erythema.       Assessment and Plan:  Status post right fifth metatarsal head excision with improvement of cellulitis  -Treatment options discussed including all alternatives, risks, and complications -I debrided the hyperkeratotic lesion today.  The wound was flushed with saline was repacked.  Continue with daily dressing changes.  Continue antibiotics.  Continue nonweightbearing and elevation.  Monitoring signs or symptoms of infection.  See back in 1 week or sooner if needed.  Call any questions or concerns.  Trula Slade DPM

## 2018-07-27 ENCOUNTER — Ambulatory Visit (INDEPENDENT_AMBULATORY_CARE_PROVIDER_SITE_OTHER): Payer: Medicare Other | Admitting: Podiatry

## 2018-07-27 DIAGNOSIS — L97519 Non-pressure chronic ulcer of other part of right foot with unspecified severity: Secondary | ICD-10-CM

## 2018-07-28 NOTE — Progress Notes (Signed)
Subjective: Sara Graves is a 77 y.o. is seen today in office s/p right foot fifth metatarsal head excision, incision and drainage preformed on 06/16/2018.  She states that she is doing well she denies any pain.  Her daughter is concerned that there is some yellow drainage coming from the incision the dorsal aspect of her foot.  She has not noticed any increase in swelling or redness or drainage or pus otherwise.  She still on antibiotics by infectious disease.  She has no other concerns today.  She denies any systemic complaints including fevers, chills, nausea, vomiting.  Denies any calf pain, chest pain, shortness of breath.  She has no other concerns today.  Objective: General: No acute distress, AAOx3  DP/PT pulses palpable 2/4, CRT < 3 sec to all digits.  Protective sensation intact. Motor function intact.  Right foot: Incision on the dorsal lateral has a similar area almost a pinhole type lesion with small amount of purulence was identified and this appeared to be superficial.  It does probe approximately half a centimeter but there is no probing to bone.  Upon debridement of hyperkeratotic tissue and submetatarsal 5 area the wound appears to be healed and there is no surrounding erythema, ascending sialitis there is no fluctuation or crepitation or any malodor.   Assessment and Plan:  Status post right fifth metatarsal head excision with improvement of cellulitis  -Treatment options discussed including all alternatives, risks, and complications -Overall clinically the foot appears to be doing much better however still concerned this today on the incision there is a pinpoint opening with a small amount of superficial purulence.  I did open this area and I irrigated with saline and there is no further purulence identified only some bleeding occurred.  We did pack the wound today and I want her to continue with iodoform packing to this area for now.  Finish course of antibiotics per infectious  disease but if the wound continue to recommend continued.  Continue nonweightbearing for now but if the wound continues to feel well that he can start to transition to weightbearing.  Trula Slade DPM

## 2018-07-29 ENCOUNTER — Telehealth: Payer: Self-pay | Admitting: *Deleted

## 2018-07-29 NOTE — Telephone Encounter (Signed)
Dr. Jacqualyn Posey states continue wound care with [facility], now pt needs top of the foot flushed with saline, continue to pack the wound to the top of the foot daily with iodoform and paint with betadine and cover with the DSD. Faxed to [facility].

## 2018-08-03 ENCOUNTER — Ambulatory Visit (INDEPENDENT_AMBULATORY_CARE_PROVIDER_SITE_OTHER): Payer: Medicare Other

## 2018-08-03 ENCOUNTER — Ambulatory Visit (INDEPENDENT_AMBULATORY_CARE_PROVIDER_SITE_OTHER): Payer: Medicare Other | Admitting: Podiatry

## 2018-08-03 DIAGNOSIS — L97519 Non-pressure chronic ulcer of other part of right foot with unspecified severity: Secondary | ICD-10-CM

## 2018-08-03 DIAGNOSIS — L03119 Cellulitis of unspecified part of limb: Secondary | ICD-10-CM

## 2018-08-03 DIAGNOSIS — L02619 Cutaneous abscess of unspecified foot: Secondary | ICD-10-CM

## 2018-08-04 NOTE — Progress Notes (Signed)
Subjective: Sara Graves is a 77 y.o. is seen today in office s/p right foot fifth metatarsal head excision, incision and drainage preformed on 06/16/2018.  Overall she states that she is doing much better.  She has not noticed any significant drainage from the bandage over the last day or so.  Her daughter-in-law does change the bandage and she has noticed very minimal drainage.  Overall is getting better.  She has no pain.  She denies any increase in swelling or redness to the foot but overall she had some swelling to both her legs and she has seen her primary care doctor they changed her fluid pills.  She is still on antibiotics.Denies any systemic complaints including fevers, chills, nausea, vomiting.  Denies any calf pain, chest pain, shortness of breath.  She has no other concerns today.  Objective: General: No acute distress, AAOx3  DP/PT pulses palpable 2/4, CRT < 3 sec to all digits.  Protective sensation intact. Motor function intact.  Right foot: Incision on the dorsal lateral has a similar area almost a pinhole type lesion with a very minimal amount of drainage, purulence identified today.  It still probes approximately half a centimeter.  There is no probing to bone there is no undermining or tunneling.  There is no fluctuation or crepitation.  Is no surrounding erythema, ascending cellulitis.  There is no other open lesions identified bilaterally and will follow the wound submetatarsal 5 area appears to be healed. There is no pain with calf compression, swelling, warmth, erythema identified today.  Assessment and Plan:  Status post right fifth metatarsal head excision with resolved cellulitis  -Treatment options discussed including all alternatives, risks, and complications -Today I did debride the area on the dorsal incision with any complications.  Very minimal purulence identified today.  This area was irrigated with saline and packed with iodoform packing.  Continue with daily  dressing changes.  Continue antibiotics.  Overall the foot clinically appears to be much improved.  I want her to wear the cam boot at all times that she can start to ambulate small distances to the bathroom and around the house. -Continue antibiotics for now. -Monitor for any clinical signs or symptoms of infection and directed to call the office immediately should any occur or go to the ER.  Trula Slade DPM

## 2018-08-06 ENCOUNTER — Telehealth: Payer: Self-pay | Admitting: Podiatry

## 2018-08-06 MED ORDER — FLUCONAZOLE 150 MG PO TABS: 150.0000 mg | ORAL_TABLET | Freq: Once | ORAL | 0 refills | Status: AC

## 2018-08-06 NOTE — Telephone Encounter (Signed)
Pt is currently on anti biotics and she has developed a yeast infection and needs a medication sent to her pharmacy.

## 2018-08-06 NOTE — Addendum Note (Signed)
Addended by: Harriett Sine D on: 08/06/2018 11:35 AM   Modules accepted: Orders

## 2018-08-06 NOTE — Telephone Encounter (Signed)
Pt states Dr. Jacqualyn Posey has her on antibiotics and had been on IV antibiotics in the hospital, she had also been taking doxycycline from Dr. Jacqualyn Posey and has a yeast infection. Dr. Jacqualyn Posey ordered Diflucan 150mg  one tablet take as directed.

## 2018-08-10 ENCOUNTER — Ambulatory Visit (INDEPENDENT_AMBULATORY_CARE_PROVIDER_SITE_OTHER): Payer: Medicare Other | Admitting: Podiatry

## 2018-08-10 ENCOUNTER — Encounter: Payer: Self-pay | Admitting: Podiatry

## 2018-08-10 VITALS — Temp 98.3°F

## 2018-08-10 DIAGNOSIS — E1149 Type 2 diabetes mellitus with other diabetic neurological complication: Secondary | ICD-10-CM

## 2018-08-10 DIAGNOSIS — L97519 Non-pressure chronic ulcer of other part of right foot with unspecified severity: Secondary | ICD-10-CM

## 2018-08-11 ENCOUNTER — Ambulatory Visit: Payer: Medicare Other | Admitting: Orthotics

## 2018-08-11 NOTE — Progress Notes (Signed)
Subjective: Sara Graves is a 77 y.o. is seen today in office s/p right foot fifth metatarsal head excision, incision and drainage preformed on 06/16/2018.  She states that she is doing well and her daughter-in-law has been changing the bandage and they have not noticed any drainage coming from the area.  She has swelling to both her legs and she states that she needs to call Dr. Irven Shelling office in regards to her medications.  She states they have been changing some of her medications.  In regards to her foot she has no pain.  She has tried to put some weight on her foot for short distances and she is been wearing the cam boot.  She still has 4 days of antibiotics left. Denies any systemic complaints including fevers, chills, nausea, vomiting.  Denies any calf pain, chest pain, shortness of breath.  She has no other concerns today.  Objective: General: No acute distress, AAOx3  DP/PT pulses palpable 2/4, CRT < 3 sec to all digits.  Protective sensation intact. Motor function intact.  Right foot: Incision on the dorsal lateral aspect appears to be healed there is no opening present.  On the area the previous wound is a hyperkeratotic lesion.  Upon debridement of this unable to identify any wound and there is no drainage or pus and there is no probing, undermining or tunneling.  The area the previous wound submetatarsal 5 with a hyperkeratotic lesion noted on the previous this appears to be healed as well.  There is equal swelling bilaterally there is no erythema or increase in warmth.  No clinical signs of infection present today. There is no pain with calf compression, swelling, warmth, erythema identified today.  Assessment and Plan:  Status post right fifth metatarsal head excision with resolved infection, ulceration  -Treatment options discussed including all alternatives, risks, and complications -I debrided the residual hyperkeratotic tissue without any complications of bleeding.  Appears that  the area is healed.  We will continue a small amount of antibiotic ointment and a dressing daily.  Finish her course of antibiotics.  She can continue to start to transition to weightbearing in the cam boot or surgical shoe.  Encouraged elevation.  Monitoring skin breakdown or recurrence of infection. -Diabetic shoe paperwork was completed today for precertification and have her follow-up with [employer] for molding.  Trula Slade DPM

## 2018-08-24 ENCOUNTER — Telehealth: Payer: Self-pay | Admitting: Podiatry

## 2018-08-24 ENCOUNTER — Ambulatory Visit (INDEPENDENT_AMBULATORY_CARE_PROVIDER_SITE_OTHER): Payer: Medicare Other | Admitting: Podiatry

## 2018-08-24 DIAGNOSIS — E1149 Type 2 diabetes mellitus with other diabetic neurological complication: Secondary | ICD-10-CM

## 2018-08-24 DIAGNOSIS — L97519 Non-pressure chronic ulcer of other part of right foot with unspecified severity: Secondary | ICD-10-CM

## 2018-08-24 NOTE — Telephone Encounter (Signed)
Patient called. Seen earlier today. Was walking in her surgical shoe and felt a pop in the area of the outside of her foot. Reports pain and inability to bear weight.  Advised Zekiel Torian therapy. If pain persists advised to call for an appointment for eval. Recommended she take one of her post-op pain medications. Discussed wearing shoe at all times when ambulating.

## 2018-08-25 ENCOUNTER — Telehealth: Payer: Self-pay | Admitting: Podiatry

## 2018-08-25 NOTE — Telephone Encounter (Signed)
Patient was seen by Dr. Jacqualyn Posey on yesterday and everything seemed fine. When I was walking last night from den to living room, I heard a pop. I spoke with the Doctor on call last night and he told me to stay off of it and apply ice. I'm just wondering if I should continue to use ice, because its still painful. I just wasent sure what I needed to do.

## 2018-08-27 ENCOUNTER — Ambulatory Visit (INDEPENDENT_AMBULATORY_CARE_PROVIDER_SITE_OTHER): Payer: Medicare Other | Admitting: Podiatry

## 2018-08-27 ENCOUNTER — Other Ambulatory Visit: Payer: Self-pay | Admitting: Podiatry

## 2018-08-27 ENCOUNTER — Ambulatory Visit (INDEPENDENT_AMBULATORY_CARE_PROVIDER_SITE_OTHER): Payer: Medicare Other

## 2018-08-27 DIAGNOSIS — M7989 Other specified soft tissue disorders: Secondary | ICD-10-CM | POA: Diagnosis not present

## 2018-08-27 DIAGNOSIS — M79671 Pain in right foot: Secondary | ICD-10-CM

## 2018-08-27 DIAGNOSIS — M7741 Metatarsalgia, right foot: Secondary | ICD-10-CM

## 2018-08-27 DIAGNOSIS — M779 Enthesopathy, unspecified: Secondary | ICD-10-CM

## 2018-08-27 DIAGNOSIS — R600 Localized edema: Secondary | ICD-10-CM

## 2018-08-27 DIAGNOSIS — M778 Other enthesopathies, not elsewhere classified: Secondary | ICD-10-CM

## 2018-08-27 DIAGNOSIS — M79674 Pain in right toe(s): Secondary | ICD-10-CM | POA: Diagnosis not present

## 2018-08-30 ENCOUNTER — Telehealth: Payer: Self-pay | Admitting: Podiatry

## 2018-08-30 NOTE — Telephone Encounter (Signed)
Still having a lot of pain, just wondering what I could do. I saw Dr. March Rummage last week, he said I have a broken bone in the same foot that I previouly had surgery on. Its still hurting pretty bad.  Patient not scheduled to come in until 10/25.

## 2018-08-30 NOTE — Telephone Encounter (Signed)
I called pt and encourage pt to decrease activity and wear the surgery boot, and take ibuprofen or regular strength tylenol for the pain if she tolerated either of the two, and I would inform Dr. March Rummage.

## 2018-08-31 NOTE — Telephone Encounter (Signed)
Agree with plan 

## 2018-09-02 NOTE — Progress Notes (Signed)
Subjective: Sara Graves is a 77 y.o. is seen today in office s/p right foot fifth metatarsal head excision, incision and drainage preformed on 06/16/2018. Overall she states that she is doing well and she is walking in the surgical shoe and she has not had any drainage or pus coming from the foot or wounds and she feels the wound is healed.  Denies any swelling or redness.  She has no pain.  She is currently off of antibiotics.  Overall she is doing well and she has no new concerns.  Objective: General: No acute distress, AAOx3  DP/PT pulses palpable 2/4, CRT < 3 sec to all digits.  Protective sensation intact. Motor function intact.  Right foot: Incision on the dorsal lateral aspect appears to be healed as well as the wound on the plantar aspect of the foot.  There is some hyperkeratotic tissue overlying the area and upon debridement appears the underlying wound is healed.  No drainage or pus there is no swelling, redness or warmth there is no clinical signs of infection.  There is no pain. There is no pain with calf compression, swelling, warmth, erythema identified today.  Assessment and Plan:  Status post right fifth metatarsal head excision with resolved infection, ulceration  -Treatment options discussed including all alternatives, risks, and complications -I debrided the hyperkeratotic lesions today without any complications or bleeding to reveal the underlying wounds of healed.  I want to keep moisturizing the area daily and she can start to transition to regular shoe.  Regular scheduled for diabetic shoes.  She is going to follow-up with [facility] for this.   Trula Slade DPM

## 2018-09-04 ENCOUNTER — Telehealth: Payer: Self-pay | Admitting: *Deleted

## 2018-09-04 NOTE — Telephone Encounter (Signed)
Called patient and left a message stating that Dr Jacqualyn Posey was checking on patient to see how she was doing since Dr Jacqualyn Posey missed her last week and to stay in the boot and to call the office if any concerns or questions. Sara Graves

## 2018-09-06 ENCOUNTER — Ambulatory Visit (INDEPENDENT_AMBULATORY_CARE_PROVIDER_SITE_OTHER): Payer: Medicare Other

## 2018-09-06 ENCOUNTER — Other Ambulatory Visit: Payer: Self-pay | Admitting: Podiatry

## 2018-09-06 ENCOUNTER — Ambulatory Visit: Payer: Medicare Other | Admitting: Podiatry

## 2018-09-06 ENCOUNTER — Encounter: Payer: Self-pay | Admitting: Podiatry

## 2018-09-06 DIAGNOSIS — M84374D Stress fracture, right foot, subsequent encounter for fracture with routine healing: Secondary | ICD-10-CM

## 2018-09-06 DIAGNOSIS — S92344D Nondisplaced fracture of fourth metatarsal bone, right foot, subsequent encounter for fracture with routine healing: Secondary | ICD-10-CM

## 2018-09-06 DIAGNOSIS — M79672 Pain in left foot: Secondary | ICD-10-CM

## 2018-09-08 ENCOUNTER — Ambulatory Visit: Payer: Medicare Other | Admitting: Orthotics

## 2018-09-08 DIAGNOSIS — S92344D Nondisplaced fracture of fourth metatarsal bone, right foot, subsequent encounter for fracture with routine healing: Secondary | ICD-10-CM

## 2018-09-08 DIAGNOSIS — L97511 Non-pressure chronic ulcer of other part of right foot limited to breakdown of skin: Secondary | ICD-10-CM

## 2018-09-08 DIAGNOSIS — L97519 Non-pressure chronic ulcer of other part of right foot with unspecified severity: Secondary | ICD-10-CM

## 2018-09-08 DIAGNOSIS — E1149 Type 2 diabetes mellitus with other diabetic neurological complication: Secondary | ICD-10-CM

## 2018-09-08 NOTE — Progress Notes (Signed)
Subjective: 77 year old female presents the office today for follow-up evaluation of fracture of the fourth metatarsal.  She states the pain is getting better but she does get pain after she is on it for some time.  She is been using the cam boot and try to limit activity level.  She has no other concerns. Denies any systemic complaints such as fevers, chills, nausea, vomiting. No acute changes since last appointment, and no other complaints at this time.   Objective: AAO x3, NAD DP/PT pulses palpable bilaterally, CRT less than 3 seconds Incision as well as the wound is well-healed.  There is mild swelling to the foot but there is no erythema or increase in warmth.  Tenderness palpation treatment of the fourth metatarsal but there is no other areas of tenderness.  There is no clinical signs of infection present. No open lesions or pre-ulcerative lesions.  No pain with calf compression, swelling, warmth, erythema  Assessment: Right fourth metatarsal fracture  Plan: -All treatment options discussed with the patient including all alternatives, risks, complications.  -X-rays were obtained reviewed.  Fracture of the fourth metatarsal diaphysis with minimal displacement.  Minimal healing present across the fracture site.  No other evidence of acute fracture identified. -Recommend he remain in the cam boot and limit activity level.  Nonweightbearing she is leaving the house.  I want her stay off of this is much as possible.  Continue to ice. -Patient encouraged to call the office with any questions, concerns, change in symptoms.   Trula Slade DPM

## 2018-09-10 ENCOUNTER — Ambulatory Visit: Payer: Medicare Other | Admitting: Podiatry

## 2018-09-17 ENCOUNTER — Ambulatory Visit (INDEPENDENT_AMBULATORY_CARE_PROVIDER_SITE_OTHER): Payer: Medicare Other

## 2018-09-17 ENCOUNTER — Ambulatory Visit: Payer: Medicare Other | Admitting: Podiatry

## 2018-09-17 ENCOUNTER — Encounter: Payer: Self-pay | Admitting: Podiatry

## 2018-09-17 DIAGNOSIS — S92344D Nondisplaced fracture of fourth metatarsal bone, right foot, subsequent encounter for fracture with routine healing: Secondary | ICD-10-CM | POA: Diagnosis not present

## 2018-09-19 DIAGNOSIS — S92344D Nondisplaced fracture of fourth metatarsal bone, right foot, subsequent encounter for fracture with routine healing: Secondary | ICD-10-CM | POA: Insufficient documentation

## 2018-09-19 NOTE — Progress Notes (Signed)
Subjective: 77 year old female presents the office today for follow-up evaluation of fracture of the fourth metatarsal.  She states that she is doing better.  She gets some occasional discomfort more to the anterior aspect of the ankle where she points and she has some sharp pain going into the toes but this is intermittent mostly towards the end of the day.  Otherwise from a fracture standpoint she says the pain is improving.  She feels that her toes are bunched up at times in the boot.  She has no other concerns and she denies any recent injury or falls or other issues.  The wound has continued to do well and is not re-opened. Denies any systemic complaints such as fevers, chills, nausea, vomiting. No acute changes since last appointment, and no other complaints at this time.   Objective: AAO x3, NAD DP/PT pulses palpable bilaterally, CRT less than 3 seconds Incision as well as the wound is well-healed and there is no clinical signs of infection..  There is trace and decreased swelling to the foot. There is no erythema or increase in warmth.  There is no tenderness to palpation treatment of the fourth metatarsal and there is no other areas of tenderness identified.  She is in some anterior ankle pain with the boot.  If this is causing some nerve compression into the toes.   There is no clinical signs of infection present. No open lesions or pre-ulcerative lesions.  No pain with calf compression, swelling, warmth, erythema  Assessment: Right fourth metatarsal fracture; neuritis  Plan: -All treatment options discussed with the patient including all alternatives, risks, complications.  -X-rays were obtained reviewed.  Fracture of the fourth metatarsal diaphysis with minimal displacement.  Bone callus formation is evident today. -Continue cam boot at all times.  I added a  pad to the anterior ankle to help offload the nerve.  Continue ice and elevate as well -Patient encouraged to call the office  with any questions, concerns, change in symptoms.    Return in about 2 weeks (around 10/01/2018). *x-ray next appointment  Trula Slade DPM

## 2018-09-20 ENCOUNTER — Ambulatory Visit: Payer: Medicare Other | Admitting: Podiatry

## 2018-09-24 NOTE — Progress Notes (Signed)
Patient had appointment today for definitive and final diabetic shoe fitting and delivery.  Patient was seen by Betha, C.Ped, [facility].   The inserts fit well and accomplished full contact with the plantar surface of the foot bilateral; the shoes fit well and offered forefoot freedom, no noticible heel slippage, and good toe clearance w/ the insert in place.  Patient was advised to monitor of any skin irritation, breakdown.  Patient was satisfied with fit and function. 

## 2018-10-01 ENCOUNTER — Ambulatory Visit (INDEPENDENT_AMBULATORY_CARE_PROVIDER_SITE_OTHER): Payer: Medicare Other | Admitting: Podiatry

## 2018-10-01 ENCOUNTER — Ambulatory Visit (INDEPENDENT_AMBULATORY_CARE_PROVIDER_SITE_OTHER): Payer: Medicare Other

## 2018-10-01 DIAGNOSIS — S92344D Nondisplaced fracture of fourth metatarsal bone, right foot, subsequent encounter for fracture with routine healing: Secondary | ICD-10-CM

## 2018-10-01 DIAGNOSIS — E1149 Type 2 diabetes mellitus with other diabetic neurological complication: Secondary | ICD-10-CM | POA: Diagnosis not present

## 2018-10-05 NOTE — Progress Notes (Signed)
Subjective: 77 year old female presents the office today for follow-up evaluation of fracture of the fourth metatarsal.  She states that overall she is doing better.  She is able to become more active in the boot.  She has some swelling discomfort at times.  Otherwise the wound that she was having previously are doing much better.  She says that she did fall out of her chair and she bent her toes on the left foot but the pain and swelling that she is having the area has resolved she has not had any swelling or bruising or any open sores in the left foot. Denies any systemic complaints such as fevers, chills, nausea, vomiting. No acute changes since last appointment, and no other complaints at this time.   Objective: AAO x3, NAD DP/PT pulses palpable bilaterally, CRT less than 3 seconds Incision as well as the wound is well-healed and there is no clinical signs of infection.  There is still some tenderness palpation of the fourth metatarsal diaphysis and some mild edema to the area but there is no erythema or increase in warmth.  No other areas of tenderness elicited on the right foot.  The left foot there is no edema, erythema there is no bruising or open sores. No open lesions or pre-ulcerative lesions.  No pain with calf compression, swelling, warmth, erythema  Assessment: Right fourth metatarsal fracture  Plan: -All treatment options discussed with the patient including all alternatives, risks, complications.  -X-rays were obtained reviewed.  Fracture of the fourth metatarsal diaphysis with minimal displacement with some increase in bone callus formation compared to last appointment.  No evidence of acute fracture identified. -Overall I think she is doing somewhat better on the right foot.  Continue cam boot, ice elevation. -Follow-up foot symptoms seem to be resolving there is no pain.  We will continue to monitor left foot.  She fell out of her chair as it was on the left and she was to have a  side effect.  Follow-up in 6 to 3 weeks or sooner if needed.  X-ray next appointment.  Trula Slade DPM

## 2018-10-08 ENCOUNTER — Emergency Department (HOSPITAL_COMMUNITY): Payer: Medicare Other

## 2018-10-08 ENCOUNTER — Other Ambulatory Visit: Payer: Self-pay

## 2018-10-08 ENCOUNTER — Inpatient Hospital Stay (HOSPITAL_COMMUNITY)
Admission: EM | Admit: 2018-10-08 | Discharge: 2018-10-10 | DRG: 291 | Disposition: A | Discharge status: Home / Self Care | Payer: Medicare Other | Attending: Cardiology | Admitting: Cardiology

## 2018-10-08 ENCOUNTER — Encounter (HOSPITAL_COMMUNITY): Payer: Self-pay

## 2018-10-08 DIAGNOSIS — M19041 Primary osteoarthritis, right hand: Secondary | ICD-10-CM | POA: Diagnosis present

## 2018-10-08 DIAGNOSIS — Z8249 Family history of ischemic heart disease and other diseases of the circulatory system: Secondary | ICD-10-CM | POA: Diagnosis not present

## 2018-10-08 DIAGNOSIS — I252 Old myocardial infarction: Secondary | ICD-10-CM | POA: Diagnosis not present

## 2018-10-08 DIAGNOSIS — Z79891 Long term (current) use of opiate analgesic: Secondary | ICD-10-CM

## 2018-10-08 DIAGNOSIS — N183 Chronic kidney disease, stage 3 (moderate): Secondary | ICD-10-CM | POA: Diagnosis present

## 2018-10-08 DIAGNOSIS — Z803 Family history of malignant neoplasm of breast: Secondary | ICD-10-CM

## 2018-10-08 DIAGNOSIS — N823 Fistula of vagina to large intestine: Secondary | ICD-10-CM | POA: Diagnosis present

## 2018-10-08 DIAGNOSIS — I25110 Atherosclerotic heart disease of native coronary artery with unstable angina pectoris: Secondary | ICD-10-CM | POA: Diagnosis not present

## 2018-10-08 DIAGNOSIS — Z794 Long term (current) use of insulin: Secondary | ICD-10-CM

## 2018-10-08 DIAGNOSIS — Z955 Presence of coronary angioplasty implant and graft: Secondary | ICD-10-CM

## 2018-10-08 DIAGNOSIS — F419 Anxiety disorder, unspecified: Secondary | ICD-10-CM | POA: Diagnosis present

## 2018-10-08 DIAGNOSIS — I200 Unstable angina: Secondary | ICD-10-CM | POA: Insufficient documentation

## 2018-10-08 DIAGNOSIS — Z885 Allergy status to narcotic agent status: Secondary | ICD-10-CM

## 2018-10-08 DIAGNOSIS — Z96651 Presence of right artificial knee joint: Secondary | ICD-10-CM | POA: Diagnosis present

## 2018-10-08 DIAGNOSIS — Z85828 Personal history of other malignant neoplasm of skin: Secondary | ICD-10-CM

## 2018-10-08 DIAGNOSIS — R079 Chest pain, unspecified: Secondary | ICD-10-CM

## 2018-10-08 DIAGNOSIS — K219 Gastro-esophageal reflux disease without esophagitis: Secondary | ICD-10-CM | POA: Diagnosis not present

## 2018-10-08 DIAGNOSIS — Z888 Allergy status to other drugs, medicaments and biological substances status: Secondary | ICD-10-CM

## 2018-10-08 DIAGNOSIS — F329 Major depressive disorder, single episode, unspecified: Secondary | ICD-10-CM | POA: Diagnosis present

## 2018-10-08 DIAGNOSIS — E785 Hyperlipidemia, unspecified: Secondary | ICD-10-CM | POA: Diagnosis present

## 2018-10-08 DIAGNOSIS — J810 Acute pulmonary edema: Secondary | ICD-10-CM

## 2018-10-08 DIAGNOSIS — Z8042 Family history of malignant neoplasm of prostate: Secondary | ICD-10-CM | POA: Diagnosis not present

## 2018-10-08 DIAGNOSIS — Z79899 Other long term (current) drug therapy: Secondary | ICD-10-CM

## 2018-10-08 DIAGNOSIS — I130 Hypertensive heart and chronic kidney disease with heart failure and stage 1 through stage 4 chronic kidney disease, or unspecified chronic kidney disease: Principal | ICD-10-CM | POA: Diagnosis present

## 2018-10-08 DIAGNOSIS — M19042 Primary osteoarthritis, left hand: Secondary | ICD-10-CM | POA: Diagnosis not present

## 2018-10-08 DIAGNOSIS — Z7982 Long term (current) use of aspirin: Secondary | ICD-10-CM

## 2018-10-08 DIAGNOSIS — Z882 Allergy status to sulfonamides status: Secondary | ICD-10-CM

## 2018-10-08 DIAGNOSIS — Z7902 Long term (current) use of antithrombotics/antiplatelets: Secondary | ICD-10-CM

## 2018-10-08 DIAGNOSIS — M7552 Bursitis of left shoulder: Secondary | ICD-10-CM | POA: Diagnosis present

## 2018-10-08 DIAGNOSIS — E039 Hypothyroidism, unspecified: Secondary | ICD-10-CM | POA: Diagnosis present

## 2018-10-08 DIAGNOSIS — I10 Essential (primary) hypertension: Secondary | ICD-10-CM | POA: Diagnosis present

## 2018-10-08 DIAGNOSIS — E1143 Type 2 diabetes mellitus with diabetic autonomic (poly)neuropathy: Secondary | ICD-10-CM | POA: Diagnosis present

## 2018-10-08 DIAGNOSIS — E1165 Type 2 diabetes mellitus with hyperglycemia: Secondary | ICD-10-CM | POA: Diagnosis present

## 2018-10-08 DIAGNOSIS — E1122 Type 2 diabetes mellitus with diabetic chronic kidney disease: Secondary | ICD-10-CM | POA: Diagnosis not present

## 2018-10-08 DIAGNOSIS — I5033 Acute on chronic diastolic (congestive) heart failure: Secondary | ICD-10-CM | POA: Diagnosis not present

## 2018-10-08 DIAGNOSIS — J811 Chronic pulmonary edema: Secondary | ICD-10-CM | POA: Diagnosis present

## 2018-10-08 DIAGNOSIS — Z79890 Hormone replacement therapy (postmenopausal): Secondary | ICD-10-CM

## 2018-10-08 LAB — BASIC METABOLIC PANEL
Anion gap: 8 (ref 5–15)
BUN: 25 mg/dL — ABNORMAL HIGH (ref 8–23)
CO2: 21 mmol/L — ABNORMAL LOW (ref 22–32)
CREATININE: 1.21 mg/dL — AB (ref 0.44–1.00)
Calcium: 9 mg/dL (ref 8.9–10.3)
Chloride: 108 mmol/L (ref 98–111)
GFR calc Af Amer: 49 mL/min — ABNORMAL LOW (ref >60)
GFR calc non Af Amer: 42 mL/min — ABNORMAL LOW (ref >60)
GLUCOSE: 306 mg/dL — AB (ref 70–99)
POTASSIUM: 3.7 mmol/L (ref 3.5–5.1)
SODIUM: 137 mmol/L (ref 135–145)

## 2018-10-08 LAB — CBC
HEMATOCRIT: 34.8 % — AB (ref 36.0–46.0)
Hemoglobin: 11.1 g/dL — ABNORMAL LOW (ref 12.0–15.0)
MCH: 30.1 pg (ref 26.0–34.0)
MCHC: 31.9 g/dL (ref 30.0–36.0)
MCV: 94.3 fL (ref 80.0–100.0)
NRBC: 0 % (ref 0.0–0.2)
Platelets: 124 10*3/uL — ABNORMAL LOW (ref 150–400)
RBC: 3.69 MIL/uL — AB (ref 3.87–5.11)
RDW: 13.2 % (ref 11.5–15.5)
WBC: 6 10*3/uL (ref 4.0–10.5)

## 2018-10-08 LAB — I-STAT TROPONIN, ED
TROPONIN I, POC: 0.03 ng/mL (ref 0.00–0.08)
Troponin i, poc: 0.03 ng/mL (ref 0.00–0.08)

## 2018-10-08 LAB — GLUCOSE, CAPILLARY: Glucose-Capillary: 185 mg/dL — ABNORMAL HIGH (ref 70–99)

## 2018-10-08 MED ORDER — HEPARIN (PORCINE) 25000 UT/250ML-% IV SOLN
1050.0000 [IU]/h | INTRAVENOUS | Status: DC
  Administered 2018-10-09: 850 [IU]/h via INTRAVENOUS
  Administered 2018-10-09: 1050 [IU]/h via INTRAVENOUS
  Filled 2018-10-08 (×2): qty 250

## 2018-10-08 MED ORDER — FUROSEMIDE 10 MG/ML IJ SOLN
40.0000 mg | Freq: Once | INTRAMUSCULAR | Status: AC
  Administered 2018-10-08: 40 mg via INTRAVENOUS
  Filled 2018-10-08: qty 4

## 2018-10-08 MED ORDER — SPIRONOLACTONE 25 MG PO TABS
25.0000 mg | ORAL_TABLET | Freq: Every day | ORAL | Status: DC
  Administered 2018-10-09 – 2018-10-10 (×2): 25 mg via ORAL
  Filled 2018-10-08 (×2): qty 1

## 2018-10-08 MED ORDER — FENTANYL CITRATE (PF) 100 MCG/2ML IJ SOLN
50.0000 ug | Freq: Once | INTRAMUSCULAR | Status: AC
  Administered 2018-10-08: 50 ug via INTRAVENOUS
  Filled 2018-10-08: qty 2

## 2018-10-08 MED ORDER — EZETIMIBE 10 MG PO TABS
10.0000 mg | ORAL_TABLET | Freq: Every day | ORAL | Status: DC
  Administered 2018-10-09 – 2018-10-10 (×2): 10 mg via ORAL
  Filled 2018-10-08 (×2): qty 1

## 2018-10-08 MED ORDER — VITAMIN D (ERGOCALCIFEROL) 1.25 MG (50000 UNIT) PO CAPS: 50000.0000 [IU] | ORAL_CAPSULE | ORAL | Status: DC

## 2018-10-08 MED ORDER — NITROGLYCERIN 0.4 MG SL SUBL: 0.4000 mg | SUBLINGUAL_TABLET | SUBLINGUAL | Status: DC | PRN

## 2018-10-08 MED ORDER — HEPARIN BOLUS VIA INFUSION
4000.0000 [IU] | Freq: Once | INTRAVENOUS | Status: AC
  Administered 2018-10-09: 4000 [IU] via INTRAVENOUS
  Filled 2018-10-08: qty 4000

## 2018-10-08 MED ORDER — ASPIRIN 300 MG RE SUPP
300.0000 mg | RECTAL | Status: AC
  Filled 2018-10-08: qty 1

## 2018-10-08 MED ORDER — ASPIRIN EC 81 MG PO TBEC: 81.0000 mg | DELAYED_RELEASE_TABLET | Freq: Every day | ORAL | Status: DC

## 2018-10-08 MED ORDER — TORSEMIDE 20 MG PO TABS: 20.0000 mg | ORAL_TABLET | Freq: Two times a day (BID) | ORAL | Status: DC | PRN

## 2018-10-08 MED ORDER — ONDANSETRON HCL 4 MG/2ML IJ SOLN
4.0000 mg | Freq: Four times a day (QID) | INTRAMUSCULAR | Status: DC | PRN
  Administered 2018-10-10: 4 mg via INTRAVENOUS
  Filled 2018-10-08: qty 2

## 2018-10-08 MED ORDER — BUSPIRONE HCL 15 MG PO TABS
7.5000 mg | ORAL_TABLET | Freq: Two times a day (BID) | ORAL | Status: DC
  Administered 2018-10-09 – 2018-10-10 (×4): 7.5 mg via ORAL
  Filled 2018-10-08: qty 2
  Filled 2018-10-08 (×3): qty 1
  Filled 2018-10-08 (×2): qty 2
  Filled 2018-10-08: qty 1
  Filled 2018-10-08: qty 2

## 2018-10-08 MED ORDER — ROSUVASTATIN CALCIUM 20 MG PO TABS
40.0000 mg | ORAL_TABLET | Freq: Every day | ORAL | Status: DC
  Administered 2018-10-09 (×2): 40 mg via ORAL
  Filled 2018-10-08 (×2): qty 2

## 2018-10-08 MED ORDER — LEVOTHYROXINE SODIUM 50 MCG PO TABS
50.0000 ug | ORAL_TABLET | Freq: Every day | ORAL | Status: DC
  Administered 2018-10-09 – 2018-10-10 (×2): 50 ug via ORAL
  Filled 2018-10-08 (×2): qty 1

## 2018-10-08 MED ORDER — ACETAMINOPHEN 500 MG PO TABS: 500.0000 mg | ORAL_TABLET | Freq: Four times a day (QID) | ORAL | Status: DC | PRN

## 2018-10-08 MED ORDER — VITAMIN B-12 1000 MCG PO TABS
1000.0000 ug | ORAL_TABLET | Freq: Every day | ORAL | Status: DC
  Administered 2018-10-09 – 2018-10-10 (×2): 1000 ug via ORAL
  Filled 2018-10-08 (×2): qty 1

## 2018-10-08 MED ORDER — ASPIRIN 81 MG PO CHEW: 324.0000 mg | CHEWABLE_TABLET | ORAL | Status: AC

## 2018-10-08 MED ORDER — LABETALOL HCL 200 MG PO TABS
100.0000 mg | ORAL_TABLET | Freq: Two times a day (BID) | ORAL | Status: DC
  Administered 2018-10-09 – 2018-10-10 (×4): 100 mg via ORAL
  Filled 2018-10-08 (×4): qty 1

## 2018-10-08 MED ORDER — ISOSORBIDE MONONITRATE ER 30 MG PO TB24
60.0000 mg | ORAL_TABLET | Freq: Every day | ORAL | Status: DC
  Administered 2018-10-09 – 2018-10-10 (×2): 60 mg via ORAL
  Filled 2018-10-08 (×2): qty 2

## 2018-10-08 MED ORDER — PRASUGREL HCL 10 MG PO TABS
10.0000 mg | ORAL_TABLET | Freq: Every day | ORAL | Status: DC
  Administered 2018-10-09 – 2018-10-10 (×2): 10 mg via ORAL
  Filled 2018-10-08 (×2): qty 1

## 2018-10-08 MED ORDER — AMLODIPINE BESYLATE 5 MG PO TABS
5.0000 mg | ORAL_TABLET | Freq: Every day | ORAL | Status: DC
  Administered 2018-10-09 – 2018-10-10 (×2): 5 mg via ORAL
  Filled 2018-10-08 (×2): qty 2

## 2018-10-08 MED ORDER — CLOPIDOGREL BISULFATE 75 MG PO TABS: 75.0000 mg | ORAL_TABLET | Freq: Every day | ORAL | Status: DC

## 2018-10-08 MED ORDER — INSULIN ASPART 100 UNIT/ML ~~LOC~~ SOLN
0.0000 [IU] | Freq: Every day | SUBCUTANEOUS | Status: DC
  Administered 2018-10-09: 2 [IU] via SUBCUTANEOUS

## 2018-10-08 MED ORDER — ASPIRIN EC 81 MG PO TBEC
81.0000 mg | DELAYED_RELEASE_TABLET | Freq: Every day | ORAL | Status: DC
  Administered 2018-10-09 – 2018-10-10 (×2): 81 mg via ORAL
  Filled 2018-10-08 (×2): qty 1

## 2018-10-08 MED ORDER — INSULIN ASPART 100 UNIT/ML ~~LOC~~ SOLN
0.0000 [IU] | Freq: Three times a day (TID) | SUBCUTANEOUS | Status: DC
  Administered 2018-10-09: 4 [IU] via SUBCUTANEOUS

## 2018-10-08 MED ORDER — FLUOXETINE HCL 20 MG PO CAPS
20.0000 mg | ORAL_CAPSULE | Freq: Every day | ORAL | Status: DC
  Administered 2018-10-09 – 2018-10-10 (×2): 20 mg via ORAL
  Filled 2018-10-08 (×2): qty 1

## 2018-10-08 NOTE — ED Triage Notes (Signed)
Per [facility], pt from home w/ a c/o of central chest pain (9/10) that began at 0930 this morning. The pain feels like the pain she had in June when she had an MI. The pt self administered 3 nitro tablets at home with relief of symptoms. The pain came back at 1700 today (5/10). Additional complaints of increased pain with deep breathing, SOB, and dizziness. No LOC. One episode of diarrhea earlier this morning. No N/V.   EMS tx: 2 nitro & 324 ASA  120/63 HR 77 SpO2 96% RA RR 20

## 2018-10-08 NOTE — ED Notes (Signed)
Pt placed on purewick for urine collection.   Pt also placed on 2L of 02 due saturation of 91%

## 2018-10-08 NOTE — Progress Notes (Signed)
ANTICOAGULATION CONSULT NOTE - Initial Consult  Pharmacy Consult for Heparin  Indication: chest pain/ACS  Allergies  Allergen Reactions  . Rosiglitazone Maleate Anaphylaxis and Swelling  . Morphine Other (See Comments)    SEVERE HYPOTENSION   . Cephalexin Diarrhea  . Sulfa Antibiotics Diarrhea and Nausea And Vomiting  . Tramadol Other (See Comments)    Unknown reaction  . Elavil [Amitriptyline] Nausea Only  . Tramadol-Acetaminophen Rash   Patient Measurements: Height: 5\' 5"  (165.1 cm) Weight: 185 lb (83.9 kg) IBW/kg (Calculated) : 57  Vital Signs: Temp: 97.7 F (36.5 C) (11/22 1829) Temp Source: Oral (11/22 1829) BP: 125/83 (11/22 2315) Pulse Rate: 52 (11/22 2315)  Labs: Recent Labs    10/08/18 1835  HGB 11.1*  HCT 34.8*  PLT 124*  CREATININE 1.21*    Estimated Creatinine Clearance: 41.7 mL/min (A) (by C-G formula based on SCr of 1.21 mg/dL (H)).   Medical History: Past Medical History:  Diagnosis Date  . Anxiety   . Arthritis    "hands" (12/29/2017)  . Basal cell carcinoma of nose    removed  . Bursitis of left shoulder   . Cat scratch fever    Late 90s  . Coronary artery disease   . Depression   . Diabetes mellitus, type II, insulin dependent ([place])   . GERD (gastroesophageal reflux disease)   . H. pylori infection 2008 and 1998   treated  . Hyperlipidemia   . Hypertension   . Hypothyroidism   . Multinodular goiter   . Rotator cuff tear, left recurrent   . Urge urinary incontinence   . UTI (lower urinary tract infection) 05/2016    Assessment: 77 y/o F with extensive PMH here with CP/dyspnea. Starting heparin per pharmacy. PTA meds have been reviewed. Hgb 11.1. Plts 124. Scr 1.21.   Goal of Therapy:  Heparin level 0.3-0.7 units/ml Monitor platelets by anticoagulation protocol: Yes   Plan:  Heparin 4000 units BOLUS Start heparin drip at 850 units/hr 0800 HL Daily CBC/HL Monitor for bleeding   Narda Bonds 10/08/2018,11:40 PM

## 2018-10-08 NOTE — ED Provider Notes (Signed)
[facility] EMERGENCY DEPARTMENT Provider Note   CSN: [id number] Arrival date & time: 10/08/18  1821     History   Chief Complaint Chief Complaint  Patient presents with  . Chest Pain    HPI Sara Graves is a 77 y.o. female.  Chest pain and dyspnea earlier this morning approximately 9 AM.  Pain is described as a pressure sensation with associated dyspnea; no diaphoresis or nausea.  She took 3 nitroglycerin tablets at 10 AM.  Status post stenting procedure in February 2019 and MI in June 2019.  Review of systems positive for diarrhea and dizziness.  Severity of symptoms is moderate.  Exertion makes symptoms worse.     Past Medical History:  Diagnosis Date  . Anxiety   . Arthritis    "hands" (12/29/2017)  . Basal cell carcinoma of nose    removed  . Bursitis of left shoulder   . Cat scratch fever    Late 90s  . Coronary artery disease   . Depression   . Diabetes mellitus, type II, insulin dependent ([place])   . GERD (gastroesophageal reflux disease)   . H. pylori infection 2008 and 1998   treated  . Hyperlipidemia   . Hypertension   . Hypothyroidism   . Multinodular goiter   . Rotator cuff tear, left recurrent   . Urge urinary incontinence   . UTI (lower urinary tract infection) 05/2016    Patient Active Problem List   Diagnosis Date Noted  . Pulmonary edema 10/08/2018  . Nondisplaced fracture of fourth metatarsal bone of right foot with routine healing 09/19/2018  . Dizziness 06/18/2018  . 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 Acute exacerbation of congestive heart failure ([place]) 04/25/2018  . NSTEMI (non-ST elevated myocardial infarction) ([place]) 04/25/2018  . Acute UTI 01/21/2018  . Anxiety and depression 01/21/2018  . Coronary artery disease involving native coronary artery of native heart without angina pectoris 01/21/2018  . Gastroesophageal reflux disease without esophagitis 01/21/2018  .  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 Shortness of breath 12/27/2017  . Proteinuria 06/26/2016  . Abdominal pain   . Vomiting 03/24/2015  . Essential hypertension 03/24/2015  . Type 2 diabetes mellitus ([place]) 01/10/2015  . Hyperlipidemia 10/24/2014  . Thrombocytopenia ([place]) 08/31/2014  . Chronic back pain 05/30/2014  . Skin cancer, basal cell 05/30/2014  . Protein-calorie malnutrition, severe ([place]) 05/16/2014  . Unspecified vitamin D deficiency 05/09/2014  . Osteopenia 04/06/2014  . Neuropathy of leg 04/27/2013  . Weight loss, abnormal 03/16/2013  . Left rotator cuff tear 12/29/2012  . Baker's cyst 04/09/2012  . UNSPECIFIED VENOUS INSUFFICIENCY 09/20/2010  . LACTOSE INTOLERANCE 01/23/2009  . Depression 02/04/2007  . Major depressive disorder, single episode 02/04/2007  . Hypothyroidism 01/14/2007  . DM (diabetes mellitus), type 2, uncontrolled ([place]) 01/14/2007  . HYPERCHOLESTEROLEMIA 01/14/2007  . HYPERTENSION, BENIGN SYSTEMIC 01/14/2007  . RHINITIS, ALLERGIC 01/14/2007  . ARTHRITIS 01/14/2007  . SCIATICA 01/14/2007    Past Surgical History:  Procedure Laterality Date  . APPENDECTOMY  AGE 19  . BACK SURGERY    . BASAL CELL CARCINOMA EXCISION     "nose"  . BLADDER NECK SUSPENSION  1970's  . BUNIONECTOMY WITH HAMMERTOE RECONSTRUCTION Bilateral   . CARPAL TUNNEL RELEASE Right 05/2017  . COLONOSCOPY W/ POLYPECTOMY    . CORONARY ANGIOPLASTY WITH STENT PLACEMENT  12/29/2017  . CORONARY STENT INTERVENTION N/A 12/29/2017   Procedure: CORONARY STENT INTERVENTION;  Surgeon: Nigel Mormon,  MD;  Location: [facility];  Service: Cardiovascular;  Laterality: N/A;  . CORONARY STENT INTERVENTION N/A 04/26/2018   Procedure: CORONARY STENT INTERVENTION;  Surgeon: Nigel Mormon, MD;  Location: [facility];  Service: Cardiovascular;  Laterality: N/A;  . ESOPHAGOGASTRODUODENOSCOPY ENDOSCOPY    . FORAMINAL DECOMPRESSION AT L2 TO THE SACRUM  01-05-2008   L2  -  S1  .  GANGLION CYST EXCISION Left 01/17/2009   ring finger  . IMPLANTATION PERMANENT SPINAL CORD STIMULATOR  06-15-2008   JUNE 2013--  BATTERY CHANGE  . JOINT REPLACEMENT    . LEFT HEART CATH AND CORONARY ANGIOGRAPHY N/A 04/26/2018   Procedure: LEFT HEART CATH AND CORONARY ANGIOGRAPHY;  Surgeon: Nigel Mormon, MD;  Location: [facility];  Service: Cardiovascular;  Laterality: N/A;  . LIPOMA EXCISION Right    RIGHT ELBOW  . METATARSAL HEAD EXCISION Right 06/16/2018   Procedure: right 5th metatarsal excision, a mini c-arm;  Surgeon: Trula Slade, DPM;  Location: [facility];  Service: Podiatry;  Laterality: Right;  anesthesia can do block  . RIGHT/LEFT HEART CATH AND CORONARY ANGIOGRAPHY N/A 12/29/2017   Procedure: RIGHT/LEFT HEART CATH AND CORONARY ANGIOGRAPHY;  Surgeon: Nigel Mormon, MD;  Location: [facility];  Service: Cardiovascular;  Laterality: N/A;  . SHOULDER OPEN ROTATOR CUFF REPAIR  09/07/2012   Procedure: ROTATOR CUFF REPAIR SHOULDER OPEN;  Surgeon: Magnus Sinning, MD;  Location: [facility];  Service: Orthopedics;  Laterality: Left;  OPEN ANTERIOR ACROMIONECTOMY AND ROTATOR CUFF REPAIR ON LEFT   . SHOULDER OPEN ROTATOR CUFF REPAIR Left 12/28/2012   Procedure: ROTATOR CUFF REPAIR SHOULDER OPEN;  Surgeon: Magnus Sinning, MD;  Location: [facility];  Service: Orthopedics;  Laterality: Left;  OPEN SHOULDER ROTATOR CUFF REPAIR ON LEFT  WITH ANTERIOR ACROMINECTOMY   . SHOULDER OPEN ROTATOR CUFF REPAIR Right 03/28/2003   RIGHT SHOULDER  DEGENERATIVE AC JOINT AND RC TEAR  . SPINAL CORD STIMULATOR BATTERY EXCHANGE N/A 07/03/2016   Procedure: SPINAL CORD STIMULATOR BATTERY PLACMENT;  Surgeon: Melina Schools, MD;  Location: [facility];  Service: Orthopedics;  Laterality: N/A;  . TONSILLECTOMY  AGE 21  . 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 VAGINAL HYSTERECTOMY  1970's     OB History   None      Home Medications     Prior to Admission medications   Medication Sig Start Date End Date Taking? Authorizing Provider  amLODipine (NORVASC) 5 MG tablet Take 1 tablet (5 mg total) by mouth daily. 12/31/17   Patwardhan, Reynold Bowen, MD  aspirin ([facility] ASPIRIN) 81 MG chewable tablet Chew 81 mg by mouth daily.     [provider]  busPIRone (BUSPAR) 7.5 MG tablet Take 7.5 mg by mouth 2 (two) times daily.    [provider]  Calcium Carb-Cholecalciferol (CALCIUM 600 + D PO) Take 1 tablet by mouth daily.    [provider]  Calcium Carbonate-Vitamin D 600-200 MG-UNIT TABS Take by mouth.    [provider]  ciprofloxacin (CIPRO) 500 MG tablet TAKE ONE TABLET (500 MG DOSE) BY MOUTH 2 (TWO) TIMES DAILY FOR 7 DAYS. 08/15/18   [provider]  clopidogrel (PLAVIX) 75 MG tablet Take by mouth. 09/02/18   [provider]  dapagliflozin propanediol (FARXIGA) 10 MG TABS tablet Take 10 mg by mouth daily.     [provider]  Dapagliflozin-metFORMIN HCl ER 08-999 MG TB24  Take by mouth.    [provider]  diphenhydrAMINE ([employer] ALLERGY) 25 mg capsule TAKE 2 CAPSULES BY MOUTH AT BEDTIME AS NEEDED 10/07/16   [provider]  ezetimibe (ZETIA) 10 MG tablet Take 10 mg by mouth daily.    [provider]  fluconazole (DIFLUCAN) 150 MG tablet TAKE 1 TABLET BY MOUTH AS ONE DOSE 06/29/18   [provider]  FLUoxetine (PROZAC) 10 MG capsule TAKE ONE CAPSULE (10 MG DOSE) BY MOUTH DAILY. ONE TABLET A DAY FOR 7 DAYS AND THEN 2 TABS A DAY 09/07/18   [provider]  glucose blood (ONETOUCH VERIO) test strip USE 1 STRIP 3 (THREE) TIMES A DAY. 12/31/17   [provider]  HUMALOG 100 UNIT/ML injection Inject 56 Units into the skin See admin instructions. For [place] e with V - GO 20 Insulin Delivery Device. Total : 56 Units / Day. 04/06/18   [provider]  HYDROcodone-acetaminophen (NORCO/VICODIN) 5-325 MG tablet Take by mouth.     [provider]  Investigational - Study Medication Take 1 tablet by mouth daily. Study name: dapagliflozin/metformin ER 10/1000mg  Additional study details: This is a Drug Study medication from Dr. Nadyne Coombes at [facility]. Patient started taking this medication on 04/22/18 and is unsure of how long she is to take this medication for. Per patient, she is on this medication as part of a Heart and Diabetes drug study. 04/28/18   Patwardhan, Reynold Bowen, MD  isosorbide mononitrate (IMDUR) 60 MG 24 hr tablet Take 60 mg by mouth daily.    [provider]  labetalol (NORMODYNE) 100 MG tablet Take 100 mg by mouth 2 (two) times daily.    [provider]  levothyroxine (SYNTHROID, LEVOTHROID) 50 MCG tablet Take 50 mcg by mouth every morning.  08/24/12   Clovis Cao, MD  nitrofurantoin, macrocrystal-monohydrate, (MACROBID) 100 MG capsule TAKE ONE CAPSULE (100 MG DOSE) BY MOUTH 2 (TWO) TIMES DAILY FOR 7 DAYS. 08/12/18   [provider]  nitroGLYCERIN (NITROSTAT) 0.4 MG SL tablet Place 1 tablet (0.4 mg total) under the tongue every 5 (five) minutes x 3 doses as needed for chest pain. 04/27/18   Patwardhan, Reynold Bowen, MD  Omega-3 Fatty Acids (FISH OIL) 1000 MG CAPS Take 1,000 mg by mouth 2 (two) times daily. OTC fish oil    [provider]  ONETOUCH VERIO test strip USE 1 STRIP 3 (THREE) TIMES A DAY. 06/13/18   [provider]  oxyCODONE (OXY IR/ROXICODONE) 5 MG immediate release tablet Take 1 tablet (5 mg total) by mouth every 6 (six) hours as needed for moderate pain. 06/19/18   Sheikh, Omair Latif, DO  pantoprazole (PROTONIX) 40 MG tablet Take 1 tablet (40 mg total) by mouth 2 (two) times daily. 12/31/17   Patwardhan, Reynold Bowen, MD  potassium chloride SA (KLOR-CON M20) 20 MEQ tablet Take 1 tablet (20 mEq total) by mouth 2 (two) times daily. With Torsemide as needed for leg swelling 04/27/18   Patwardhan, Reynold Bowen, MD  prasugrel (EFFIENT) 10 MG TABS tablet Take 1  tablet (10 mg total) by mouth daily. 04/28/18   Patwardhan, Reynold Bowen, MD  rosuvastatin (CRESTOR) 40 MG tablet Take 1 tablet (40 mg total) by mouth at bedtime. 12/31/17   Patwardhan, Reynold Bowen, MD  spironolactone (ALDACTONE) 25 MG tablet Take by mouth. 07/29/18   [provider]  terconazole (TERAZOL 3) 0.8 % vaginal cream Insert 1 applicator vaginally every night x 3 nights 05/13/18   [provider]  torsemide (DEMADEX) 20 MG tablet Take 20 mg by mouth daily as needed (leg edema).  03/16/18   [provider]  vitamin B-12 (CYANOCOBALAMIN) 1000 MCG tablet Take by mouth. 07/28/18   [provider]  Vitamin D, Ergocalciferol, (DRISDOL) 50000 units CAPS capsule Take 50,000 Units by mouth every Thursday.     [provider]    Family History Family History  Problem Relation Age of Onset  . Cancer Mother        breast  . Heart Problems Mother   . Prostate cancer Father   . Muscular dystrophy Son   . Cancer Maternal Grandmother        breast  . Heart disease Maternal Grandfather   . Breast cancer Sister     Social History Social History   Tobacco Use  . Smoking status: Never Smoker  . Smokeless tobacco: Never Used  Substance Use Topics  . Alcohol use: No  . Drug use: No     Allergies   Rosiglitazone maleate; Morphine; Cephalexin; Sulfa antibiotics; Tramadol; Elavil [amitriptyline]; and Tramadol-acetaminophen   Review of Systems Review of Systems  All other systems reviewed and are negative.    Physical Exam Updated Vital Signs BP (!) 156/51   Pulse (!) 58   Temp 97.7 F (36.5 C) (Oral)   Resp (!) 22   Ht 5\' 5"  (1.651 m)   Wt 83.9 kg   SpO2 94%   BMI 30.79 kg/m   Physical Exam  Constitutional: She is oriented to person, place, and time. She appears well-developed and well-nourished.  HENT:  Head: Normocephalic and atraumatic.  Eyes: Conjunctivae are normal.  Neck: Neck supple.  Cardiovascular: Normal rate and regular  rhythm.  Pulmonary/Chest: Effort normal.  Min rales  Abdominal: Soft. Bowel sounds are normal.  Musculoskeletal: Normal range of motion.  Neurological: She is alert and oriented to person, place, and time.  Skin: Skin is warm and dry.  Psychiatric: She has a normal mood and affect. Her behavior is normal.  Nursing note and vitals reviewed.    ED Treatments / Results  Labs (all labs ordered are listed, but only abnormal results are displayed) Labs Reviewed  BASIC METABOLIC PANEL - Abnormal; Notable for the following components:      Result Value   CO2 21 (*)    Glucose, Bld 306 (*)    BUN 25 (*)    Creatinine, Ser 1.21 (*)    GFR calc non Af Amer 42 (*)    GFR calc Af Amer 49 (*)    All other components within normal limits  CBC - Abnormal; Notable for the following components:   RBC 3.69 (*)    Hemoglobin 11.1 (*)    HCT 34.8 (*)    Platelets 124 (*)    All other components within normal limits  I-STAT TROPONIN, ED  I-STAT TROPONIN, ED    EKG EKG Interpretation  Date/Time:  Friday October 08 2018 18:25:12 EST Ventricular Rate:  56 PR Interval:    QRS Duration: 158 QT Interval:  494 QTC Calculation: 486 R Axis:   -19 Text Interpretation:  Sinus rhythm Atrial premature complex Left bundle branch block Confirmed by Nat Christen [phone]) on 10/08/2018 6:57:47 PM   Radiology Dg Chest 2 View  Result Date: 10/08/2018 CLINICAL DATA:  Chest pain.  Shortness of breath. EXAM: CHEST - 2 VIEW COMPARISON:  06/14/2018 and 04/25/2018. FINDINGS: The heart size is at the upper limits of normal. Aortic atherosclerosis  and coronary artery stents are noted. There is mild interstitial edema with fissural thickening. No confluent airspace opacity or significant pleural effusion. Thoracic spinal stimulator and postsurgical changes in the right shoulder are noted. IMPRESSION: Mild interstitial pulmonary edema consistent with congestive heart failure. Aortic atherosclerosis. Electronically  Signed   By: Richardean Sale M.D.   On: 10/08/2018 19:58    Procedures Procedures (including critical care time)  Medications Ordered in ED Medications  furosemide (LASIX) injection 40 mg (has no administration in time range)  fentaNYL (SUBLIMAZE) injection 50 mcg (50 mcg Intravenous Given 10/08/18 2022)     Initial Impression / Assessment and Plan / ED Course  I have reviewed the triage vital signs and the nursing notes.  Pertinent labs & imaging results that were available during my care of the patient were reviewed by me and considered in my medical decision making (see chart for details).     Patient with a known history of coronary artery disease presents with chest pain and dyspnea earlier today.  Initial EKG shows no acute changes.  First troponin 0 0.03.  Chest x-ray shows pulmonary edema.  IV Lasix administered.  Discussed with [name] cardiologist.  He will admit.  CRITICAL CARE Performed by: Nat Christen Total critical care time: 30 minutes Critical care time was exclusive of separately billable procedures and treating other patients. Critical care was necessary to treat or prevent imminent or life-threatening deterioration. Critical care was time spent personally by me on the following activities: development of treatment plan with patient and/or surrogate as well as nursing, discussions with consultants, evaluation of patient's response to treatment, examination of patient, obtaining history from patient or surrogate, ordering and performing treatments and interventions, ordering and review of laboratory studies, ordering and review of radiographic studies, pulse oximetry and re-evaluation of patient's condition.  Final Clinical Impressions(s) / ED Diagnoses   Final diagnoses:  Chest pain, unspecified type  Acute pulmonary edema [facility])    ED Discharge Orders    None       Nat Christen, MD 10/08/18 2229

## 2018-10-08 NOTE — H&P (Signed)
Sara Graves is an 77 y.o. female.   Chief Complaint: Chest pain and dyspnea HPI: Ms. Sara Graves is a Caucasian female with history of uncontrolled diabetes mellitus, diabetic gastroparesis, hypertension, hyperlipidemia, rectovaginal fistula, recurrent episodes of acute on chronic diastolic heart failure and has EF 45% by echocardiogram in 04/26/18, Coronary angiography on 12/30/2017 revealing high-grade ostial LAD 80% stenosis and moderate-sized ramus bifurcation 80-90% % stenosis S/P  Resolute Onyx 3.0 X 15 mm drug-eluting stent to ostial LAD and presenting with non-ST elevation MI and underwent angioplasty to ramus intermediate on 04/26/2018 with 2.0 x 12 mm resolute DES.  Patient was admitted to the hospital in August 2019 with right foot cellulitis and osteomyelitis with chronic ulceration and left toe cellulitis and underwent right fifth metatarsal head excision on 06/16/2018.   She was doing well until this morning, started noticing worsening dyspnea on exertion, also started having chest tightness for which she took nitroglycerin with relief.  But she continued to have worsening chest discomfort and dyspnea and presented to the emergency room.  Since being in the emergency room she has had no recurrence of chest pain, chest x-ray reveals pulmonary edema.  Due to her complex cardiac history, it is felt that she was admitted to the hospital for further cardiovascular evaluation.  Chest pain is described as similar to her chest pain prior to coronary angioplasty.  Minimal activity brought on chest pain.  She is presently chest pain-free.  Past Medical History:  Diagnosis Date  . Anxiety   . Arthritis    "hands" (12/29/2017)  . Basal cell carcinoma of nose    removed  . Bursitis of left shoulder   . Cat scratch fever    Late 90s  . Coronary artery disease   . Depression   . Diabetes mellitus, type II, insulin dependent ([place])   . GERD (gastroesophageal reflux disease)   . H. pylori  infection 2008 and 1998   treated  . Hyperlipidemia   . Hypertension   . Hypothyroidism   . Multinodular goiter   . Rotator cuff tear, left recurrent   . Urge urinary incontinence   . UTI (lower urinary tract infection) 05/2016    Past Surgical History:  Procedure Laterality Date  . APPENDECTOMY  AGE 14  . BACK SURGERY    . BASAL CELL CARCINOMA EXCISION     "nose"  . BLADDER NECK SUSPENSION  1970's  . BUNIONECTOMY WITH HAMMERTOE RECONSTRUCTION Bilateral   . CARPAL TUNNEL RELEASE Right 05/2017  . COLONOSCOPY W/ POLYPECTOMY    . CORONARY ANGIOPLASTY WITH STENT PLACEMENT  12/29/2017  . CORONARY STENT INTERVENTION N/A 12/29/2017   Procedure: CORONARY STENT INTERVENTION;  Surgeon: Nigel Mormon, MD;  Location: [facility];  Service: Cardiovascular;  Laterality: N/A;  . CORONARY STENT INTERVENTION N/A 04/26/2018   Procedure: CORONARY STENT INTERVENTION;  Surgeon: Nigel Mormon, MD;  Location: [facility];  Service: Cardiovascular;  Laterality: N/A;  . ESOPHAGOGASTRODUODENOSCOPY ENDOSCOPY    . FORAMINAL DECOMPRESSION AT L2 TO THE SACRUM  01-05-2008   L2  -  S1  . GANGLION CYST EXCISION Left 01/17/2009   ring finger  . IMPLANTATION PERMANENT SPINAL CORD STIMULATOR  06-15-2008   JUNE 2013--  BATTERY CHANGE  . JOINT REPLACEMENT    . LEFT HEART CATH AND CORONARY ANGIOGRAPHY N/A 04/26/2018   Procedure: LEFT HEART CATH AND CORONARY ANGIOGRAPHY;  Surgeon: Nigel Mormon, MD;  Location: [facility];  Service: Cardiovascular;  Laterality:  N/A;  . LIPOMA EXCISION Right    RIGHT ELBOW  . METATARSAL HEAD EXCISION Right 06/16/2018   Procedure: right 5th metatarsal excision, a mini c-arm;  Surgeon: Trula Slade, DPM;  Location: [facility];  Service: Podiatry;  Laterality: Right;  anesthesia can do block  . RIGHT/LEFT HEART CATH AND CORONARY ANGIOGRAPHY N/A 12/29/2017   Procedure: RIGHT/LEFT HEART CATH AND CORONARY ANGIOGRAPHY;  Surgeon: Nigel Mormon,  MD;  Location: [facility];  Service: Cardiovascular;  Laterality: N/A;  . SHOULDER OPEN ROTATOR CUFF REPAIR  09/07/2012   Procedure: ROTATOR CUFF REPAIR SHOULDER OPEN;  Surgeon: Magnus Sinning, MD;  Location: [facility];  Service: Orthopedics;  Laterality: Left;  OPEN ANTERIOR ACROMIONECTOMY AND ROTATOR CUFF REPAIR ON LEFT   . SHOULDER OPEN ROTATOR CUFF REPAIR Left 12/28/2012   Procedure: ROTATOR CUFF REPAIR SHOULDER OPEN;  Surgeon: Magnus Sinning, MD;  Location: [facility];  Service: Orthopedics;  Laterality: Left;  OPEN SHOULDER ROTATOR CUFF REPAIR ON LEFT  WITH ANTERIOR ACROMINECTOMY   . SHOULDER OPEN ROTATOR CUFF REPAIR Right 03/28/2003   RIGHT SHOULDER  DEGENERATIVE AC JOINT AND RC TEAR  . SPINAL CORD STIMULATOR BATTERY EXCHANGE N/A 07/03/2016   Procedure: SPINAL CORD STIMULATOR BATTERY PLACMENT;  Surgeon: Melina Schools, MD;  Location: [facility];  Service: Orthopedics;  Laterality: N/A;  . TONSILLECTOMY  AGE 40  . 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 VAGINAL HYSTERECTOMY  1970's    Family History  Problem Relation Age of Onset  . Cancer Mother        breast  . Heart Problems Mother   . Prostate cancer Father   . Muscular dystrophy Son   . Cancer Maternal Grandmother        breast  . Heart disease Maternal Grandfather   . Breast cancer Sister    Social History:  reports that she has never smoked. She has never used smokeless tobacco. She reports that she does not drink alcohol or use drugs.  Allergies:  Allergies  Allergen Reactions  . Rosiglitazone Maleate Anaphylaxis and Swelling  . Morphine Other (See Comments)    SEVERE HYPOTENSION   . Cephalexin Diarrhea  . Sulfa Antibiotics Diarrhea and Nausea And Vomiting  . Tramadol Other (See Comments)    Unknown reaction  . Elavil [Amitriptyline] Nausea Only  . Tramadol-Acetaminophen Rash   Review of Systems  Constitutional: Negative.   HENT: Negative.   Eyes:  Negative.   Respiratory: Positive for shortness of breath. Negative for cough, hemoptysis and wheezing.   Cardiovascular: Positive for chest pain, orthopnea and leg swelling. Negative for palpitations.  Gastrointestinal: Negative.  Negative for heartburn and vomiting.  Genitourinary: Negative.   Musculoskeletal: Negative.   Skin: Negative.   Endo/Heme/Allergies: Negative.   Psychiatric/Behavioral: Negative.   All other systems reviewed and are negative.    Blood pressure (!) 149/49, pulse (!) 57, temperature 97.7 F (36.5 C), temperature source Oral, resp. rate 19, height _0  (1.651 m), weight 83.9 kg, SpO2 97 %. Body mass index is 30.79 kg/m.   Physical Exam  Constitutional: She is oriented to person, place, and time. She appears well-developed and well-nourished. She appears distressed.  HENT:  Head: Atraumatic.  Eyes: Conjunctivae are normal.  Neck: JVD present.  Cardiovascular: Normal rate.  Murmur (II/VI SEM in MA and AA ) heard. Pulses:      Carotid pulses are 3+ on the right side, and 3+ on the  left side.      Radial pulses are 3+ on the right side, and 3+ on the left side.       Femoral pulses are 3+ on the right side, and 3+ on the left side.      Popliteal pulses are 2+ on the right side, and 2+ on the left side.       Dorsalis pedis pulses are 0 on the right side, and 0 on the left side.       Posterior tibial pulses are 0 on the right side, and 0 on the left side.  Pulmonary/Chest: Effort normal and breath sounds normal.  Abdominal: Soft. Bowel sounds are normal.  Musculoskeletal: Normal range of motion. She exhibits edema (2 plus bilateral below knee edema).  Neurological: She is alert and oriented to person, place, and time.  Skin: Skin is warm and dry.    Results for orders placed or performed during the hospital encounter of 10/08/18 (from the past 48 hour(s))  Basic metabolic panel     Status: Abnormal   Collection Time: 10/08/18  6:35 PM  Result Value Ref  Range   Sodium 137 135 - 145 mmol/L   Potassium 3.7 3.5 - 5.1 mmol/L   Chloride 108 98 - 111 mmol/L   CO2 21 (L) 22 - 32 mmol/L   Glucose, Bld 306 (H) 70 - 99 mg/dL   BUN 25 (H) 8 - 23 mg/dL   Creatinine, Ser 1.21 (H) 0.44 - 1.00 mg/dL   Calcium 9.0 8.9 - 10.3 mg/dL   GFR calc non Af Amer 42 (L) >60 mL/min   GFR calc Af Amer 49 (L) >60 mL/min    Comment: (NOTE) The eGFR has been calculated using the CKD EPI equation. This calculation has not been validated in all clinical situations. eGFR's persistently <60 mL/min signify possible Chronic Kidney Disease.    Anion gap 8 5 - 15    Comment: Performed at [facility] [address] [place], [place] [address]  CBC     Status: Abnormal   Collection Time: 10/08/18  6:35 PM  Result Value Ref Range   WBC 6.0 4.0 - 10.5 K/uL   RBC 3.69 (L) 3.87 - 5.11 MIL/uL   Hemoglobin 11.1 (L) 12.0 - 15.0 g/dL   HCT 34.8 (L) 36.0 - 46.0 %   MCV 94.3 80.0 - 100.0 fL   MCH 30.1 26.0 - 34.0 pg   MCHC 31.9 30.0 - 36.0 g/dL   RDW 13.2 11.5 - 15.5 %   Platelets 124 (L) 150 - 400 K/uL   nRBC 0.0 0.0 - 0.2 %    Comment: Performed at [facility], [place] [address], [place], [place] [address]  I-stat troponin, ED     Status: None   Collection Time: 10/08/18  7:18 PM  Result Value Ref Range   Troponin i, poc 0.03 0.00 - 0.08 ng/mL   Comment 3            Comment: Due to the release kinetics of cTnI, a negative result within the first hours of the onset of symptoms does not rule out myocardial infarction with certainty. If myocardial infarction is still suspected, repeat the test at appropriate intervals.   I-stat troponin, ED     Status: None   Collection Time: 10/08/18 10:17 PM  Result Value Ref Range   Troponin i, poc 0.03 0.00 - 0.08 ng/mL   Comment 3  Comment: Due to the release kinetics of cTnI, a negative result within the first hours of the onset of symptoms does not rule out myocardial infarction with certainty. If  myocardial infarction is still suspected, repeat the test at appropriate intervals.     Labs:   Lab Results  Component Value Date   WBC 6.0 10/08/2018   HGB 11.1 (L) 10/08/2018   HCT 34.8 (L) 10/08/2018   MCV 94.3 10/08/2018   PLT 124 (L) 10/08/2018   Recent Labs  Lab 10/08/18 1835  NA 137  K 3.7  CL 108  CO2 21*  BUN 25*  CREATININE 1.21*  CALCIUM 9.0  GLUCOSE 306*   Recent Labs    04/25/18 1256  BNP 549.5*    HEMOGLOBIN A1C Lab Results  Component Value Date   HGBA1C 8.9 (H) 06/15/2018   MPG 208.73 06/15/2018    Cardiac Panel (last 3 results) Recent Labs    04/25/18 1659 04/25/18 2328 04/26/18 0633  TROPONINI 0.59* 0.72* 0.53*    Lab Results  Component Value Date   CKTOTAL 35 09/16/2011   TROPONINI 0.53 ([place]) 04/26/2018    No current facility-administered medications for this encounter.   Current Outpatient Medications:  .  amLODipine (NORVASC) 5 MG tablet, Take 1 tablet (5 mg total) by mouth daily., Disp: 60 tablet, Rfl: 1 .  aspirin EC 81 MG tablet, Take 81 mg by mouth daily., Disp: , Rfl:  .  busPIRone (BUSPAR) 7.5 MG tablet, Take 7.5 mg by mouth 2 (two) times daily., Disp: , Rfl:  .  Calcium Carb-Cholecalciferol (CALCIUM 600 + D PO), Take 600 mg by mouth daily. , Disp: , Rfl:  .  ezetimibe (ZETIA) 10 MG tablet, Take 10 mg by mouth daily., Disp: , Rfl:  .  FLUoxetine (PROZAC) 20 MG capsule, Take 20 mg by mouth daily. , Disp: , Rfl: 1 .  insulin lispro (HUMALOG) 100 UNIT/ML injection, Inject 2-6 Units into the skin See admin instructions. For use with V - GO 20 Insulin Delivery Device: inject 6 units subcutaneously three times daily before meals, inject 2 units at bedtime if eating a bedtime snack, Disp: , Rfl:  .  Investigational - Study Medication, Take 1 tablet by mouth daily. Study name: dapagliflozin/metformin ER 10/1000mg Additional study details: This is a Drug Study medication from Dr. Nadyne Coombes at [facility]. Patient started taking this  medication on 04/22/18 and is unsure of how long she is to take this medication for. Per patient, she is on this medication as part of a Heart and Diabetes drug study., Disp: 1 each, Rfl: PRN .  isosorbide mononitrate (IMDUR) 60 MG 24 hr tablet, Take 60 mg by mouth daily., Disp: , Rfl:  .  labetalol (NORMODYNE) 100 MG tablet, Take 100 mg by mouth 2 (two) times daily., Disp: , Rfl:  .  levothyroxine (SYNTHROID, LEVOTHROID) 50 MCG tablet, Take 50 mcg by mouth daily before breakfast. , Disp: , Rfl:  .  nitroGLYCERIN (NITROSTAT) 0.4 MG SL tablet, Place 1 tablet (0.4 mg total) under the tongue every 5 (five) minutes x 3 doses as needed for chest pain., Disp: 30 tablet, Rfl: 1 .  pantoprazole (PROTONIX) 40 MG tablet, Take 1 tablet (40 mg total) by mouth 2 (two) times daily. (Patient taking differently: Take 40 mg by mouth daily as needed (acid reflux). ), Disp: 60 tablet, Rfl: 1 .  potassium chloride SA (KLOR-CON M20) 20 MEQ tablet, Take 1 tablet (20 mEq total) by mouth 2 (two) times daily.  With Torsemide as needed for leg swelling (Patient taking differently: Take 20 mEq by mouth 2 (two) times daily as needed (with each dose of torsemide). ), Disp: 60 tablet, Rfl: 0 .  prasugrel (EFFIENT) 10 MG TABS tablet, Take 1 tablet (10 mg total) by mouth daily., Disp: 30 tablet, Rfl: 6 .  rosuvastatin (CRESTOR) 40 MG tablet, Take 1 tablet (40 mg total) by mouth at bedtime., Disp: 60 tablet, Rfl: 3 .  spironolactone (ALDACTONE) 25 MG tablet, Take 25 mg by mouth daily. , Disp: , Rfl:  .  torsemide (DEMADEX) 20 MG tablet, Take 20 mg by mouth 2 (two) times daily as needed (leg swelling). , Disp: , Rfl: 3 .  vitamin B-12 (CYANOCOBALAMIN) 1000 MCG tablet, Take 1,000 mcg by mouth daily. , Disp: , Rfl:  .  Vitamin D, Ergocalciferol, (DRISDOL) 50000 units CAPS capsule, Take 50,000 Units by mouth every Thursday. , Disp: , Rfl:  .  clopidogrel (PLAVIX) 75 MG tablet, Take 75 mg by mouth daily. , Disp: , Rfl:  .  glucose blood  (ONETOUCH VERIO) test strip, USE 1 STRIP 3 (THREE) TIMES A DAY., Disp: , Rfl:  .  ONETOUCH VERIO test strip, USE 1 STRIP 3 (THREE) TIMES A DAY., Disp: , Rfl: 5 .  oxyCODONE (OXY IR/ROXICODONE) 5 MG immediate release tablet, Take 1 tablet (5 mg total) by mouth every 6 (six) hours as needed for moderate pain. (Patient not taking: Reported on 10/08/2018), Disp: 10 tablet, Rfl: 0  CARDIAC STUDIES:  EKG 10/08/2018: Normal sinus rhythm at rate of 56 bpm, normal axis.  Intermittent left bundle branch block.  Compared to prior EKG, LBBB.  Echocardiogram 04/26/2018: Normal LV size, moderate LVH, EF 81-18%, grade 2 diastolic dysfunction, mild to moderate AI, mild to moderate MR, mild pulmonary hypertension.  Assessment/Plan 1.  Chest pain suggestive of unstable angina pectoris, relieved with nitroglycerin 2.  Shortness of breath and dyspnea on exertion, acute on chronic diastolic heart failure 3.  Diabetes mellitus type II uncontrolled with Stage III chronic kidney disease 3.  Fluid overload state 5.  Right foot cellulitis and right 1st metatarsal resection, no significant CAD, diabetic foot ulcer. 6.  Hyperlipidemia controlled 7.  Hypertension controlled.  Recommendations: Patient will be admitted to the hospital for evaluation and management of unstable angina and also acute diastolic heart failure.  She'll be aggressively diuresed and serum troponin will be followed through.  Given her significant cardiovascular risks, would proceed with repeat coronary angiography to evaluate progression of CAD in view of unstable angina symptoms. Please see order set  For further evaluation. Will trend cardiac markers and also renal function. DM continues to be a major issue.  Adrian Prows, MD 10/08/2018, 10:50 PM [facility]. [place] Pager: [phone] Office: [phone] If no answer: Cell:  [phone]

## 2018-10-09 ENCOUNTER — Other Ambulatory Visit: Payer: Self-pay

## 2018-10-09 DIAGNOSIS — I5033 Acute on chronic diastolic (congestive) heart failure: Secondary | ICD-10-CM | POA: Diagnosis not present

## 2018-10-09 DIAGNOSIS — R079 Chest pain, unspecified: Secondary | ICD-10-CM | POA: Diagnosis not present

## 2018-10-09 DIAGNOSIS — N823 Fistula of vagina to large intestine: Secondary | ICD-10-CM | POA: Diagnosis not present

## 2018-10-09 DIAGNOSIS — N183 Chronic kidney disease, stage 3 (moderate): Secondary | ICD-10-CM | POA: Diagnosis not present

## 2018-10-09 DIAGNOSIS — I130 Hypertensive heart and chronic kidney disease with heart failure and stage 1 through stage 4 chronic kidney disease, or unspecified chronic kidney disease: Secondary | ICD-10-CM | POA: Diagnosis not present

## 2018-10-09 LAB — TROPONIN I
Troponin I: 0.03 ng/mL (ref <0.03)
Troponin I: <0.03 ng/mL (ref <0.03)
Troponin I: <0.03 ng/mL (ref <0.03)

## 2018-10-09 LAB — HEMOGLOBIN A1C
HEMOGLOBIN A1C: 8.8 % — AB (ref 4.8–5.6)
MEAN PLASMA GLUCOSE: 205.86 mg/dL

## 2018-10-09 LAB — GLUCOSE, CAPILLARY
GLUCOSE-CAPILLARY: 250 mg/dL — AB (ref 70–99)
Glucose-Capillary: 173 mg/dL — ABNORMAL HIGH (ref 70–99)
Glucose-Capillary: 172 mg/dL — ABNORMAL HIGH (ref 70–99)
Glucose-Capillary: 246 mg/dL — ABNORMAL HIGH (ref 70–99)

## 2018-10-09 LAB — HEPARIN LEVEL (UNFRACTIONATED)
HEPARIN UNFRACTIONATED: 0.17 [IU]/mL — AB (ref 0.30–0.70)
Heparin Unfractionated: 0.21 IU/mL — ABNORMAL LOW (ref 0.30–0.70)

## 2018-10-09 LAB — BRAIN NATRIURETIC PEPTIDE: B NATRIURETIC PEPTIDE 5: 432.8 pg/mL — AB (ref 0.0–100.0)

## 2018-10-09 MED ORDER — FUROSEMIDE 10 MG/ML IJ SOLN
40.0000 mg | Freq: Every day | INTRAMUSCULAR | Status: DC
  Administered 2018-10-09 – 2018-10-10 (×2): 40 mg via INTRAVENOUS
  Filled 2018-10-09 (×2): qty 4

## 2018-10-09 MED ORDER — HEPARIN BOLUS VIA INFUSION
2250.0000 [IU] | Freq: Once | INTRAVENOUS | Status: AC
  Administered 2018-10-09: 2250 [IU] via INTRAVENOUS
  Filled 2018-10-09: qty 2250

## 2018-10-09 MED ORDER — HEPARIN (PORCINE) 25000 UT/250ML-% IV SOLN
1250.0000 [IU]/h | INTRAVENOUS | Status: DC
  Administered 2018-10-09: 1250 [IU]/h via INTRAVENOUS

## 2018-10-09 MED ORDER — HEPARIN BOLUS VIA INFUSION
2000.0000 [IU] | Freq: Once | INTRAVENOUS | Status: AC
  Administered 2018-10-09: 2000 [IU] via INTRAVENOUS
  Filled 2018-10-09: qty 2000

## 2018-10-09 MED ORDER — INSULIN ASPART 100 UNIT/ML ~~LOC~~ SOLN
0.0000 [IU] | Freq: Three times a day (TID) | SUBCUTANEOUS | Status: DC
  Administered 2018-10-09: 3 [IU] via SUBCUTANEOUS
  Administered 2018-10-09 – 2018-10-10 (×3): 2 [IU] via SUBCUTANEOUS

## 2018-10-09 NOTE — Plan of Care (Signed)

## 2018-10-09 NOTE — Plan of Care (Signed)
  Problem: Education: Goal: Knowledge of General Education information will improve Description Including pain rating scale, medication(s)/side effects and non-pharmacologic comfort measures Outcome: Progressing   Problem: Clinical Measurements: Goal: Ability to maintain clinical measurements within normal limits will improve Outcome: Progressing   Problem: Clinical Measurements: Goal: Diagnostic test results will improve Outcome: Progressing   Problem: Pain Managment: Goal: General experience of comfort will improve Outcome: Progressing   Problem: Skin Integrity: Goal: Risk for impaired skin integrity will decrease Outcome: Progressing

## 2018-10-09 NOTE — Plan of Care (Signed)
  Problem: Nutrition: Goal: Adequate nutrition will be maintained Outcome: Completed/Met   Problem: Coping: Goal: Level of anxiety will decrease Outcome: Completed/Met   Problem: Pain Managment: Goal: General experience of comfort will improve Outcome: Completed/Met   Problem: Skin Integrity: Goal: Risk for impaired skin integrity will decrease Outcome: Completed/Met   

## 2018-10-09 NOTE — Progress Notes (Signed)
ANTICOAGULATION CONSULT NOTE - Initial Consult  Pharmacy Consult for Heparin  Indication: chest pain/ACS  Allergies  Allergen Reactions  . Rosiglitazone Maleate Anaphylaxis and Swelling  . Morphine Other (See Comments)    SEVERE HYPOTENSION   . Cephalexin Diarrhea  . Sulfa Antibiotics Diarrhea and Nausea And Vomiting  . Tramadol Other (See Comments)    Unknown reaction  . Elavil [Amitriptyline] Nausea Only  . Tramadol-Acetaminophen Rash   Patient Measurements: Height: 5\' 5"  (165.1 cm) Weight: 187 lb 14.4 oz (85.2 kg) IBW/kg (Calculated) : 57   Heparin dosing weight: 75kg  Vital Signs: Temp: 97.4 F (36.3 C) (11/23 0529) Temp Source: Oral (11/23 0529) BP: 124/42 (11/23 0529) Pulse Rate: 58 (11/23 0529)  Labs: Recent Labs    10/08/18 1835 10/08/18 2349 10/09/18 0704  HGB 11.1*  --   --   HCT 34.8*  --   --   PLT 124*  --   --   HEPARINUNFRC  --   --  0.17*  CREATININE 1.21*  --   --   TROPONINI  --  0.03*  --     Estimated Creatinine Clearance: 42 mL/min (A) (by C-G formula based on SCr of 1.21 mg/dL (H)).   Medical History: Past Medical History:  Diagnosis Date  . Anxiety   . Arthritis    "hands" (12/29/2017)  . Basal cell carcinoma of nose    removed  . Bursitis of left shoulder   . Cat scratch fever    Late 90s  . Coronary artery disease   . Depression   . Diabetes mellitus, type II, insulin dependent ([place])   . GERD (gastroesophageal reflux disease)   . H. pylori infection 2008 and 1998   treated  . Hyperlipidemia   . Hypertension   . Hypothyroidism   . Multinodular goiter   . Rotator cuff tear, left recurrent   . Urge urinary incontinence   . UTI (lower urinary tract infection) 05/2016    Assessment: 77 y/o F presents with chest pain and dyspnia. Extensive PMH including DM, HTN, HLD, HFrEF. Starting heparin per pharmacy. PTA meds have been reviewed. Hgb 11.1, Plts 124, Scr 1.21.   Hep level subtherapeutic at 0.17  No problems with  heparin drip and no s/s of bleeding per nurse  Goal of Therapy:  Heparin level 0.3-0.7 units/ml Monitor platelets by anticoagulation protocol: Yes   Plan:  Reload Heparin 2250 units bolus  Increase heparin drip to 1050 units/hr Obtain heparin level at 1800 Monitor for daily CBC/HL and bleeding   Gwenlyn Found, Sherian Rein D PGY1 Pharmacy Resident  Phone [phone] 10/09/2018   8:08 AM

## 2018-10-09 NOTE — Progress Notes (Signed)
ANTICOAGULATION CONSULT NOTE - follow up Pharmacy Consult for Heparin  Indication: chest pain/ACS  Allergies  Allergen Reactions  . Rosiglitazone Maleate Anaphylaxis and Swelling  . Morphine Other (See Comments)    SEVERE HYPOTENSION   . Cephalexin Diarrhea  . Sulfa Antibiotics Diarrhea and Nausea And Vomiting  . Tramadol Other (See Comments)    Unknown reaction  . Elavil [Amitriptyline] Nausea Only  . Tramadol-Acetaminophen Rash   Patient Measurements: Height: 5\' 5"  (165.1 cm) Weight: 187 lb 14.4 oz (85.2 kg) IBW/kg (Calculated) : 57  Heparin dosing weight: 75kg  Vital Signs: Temp: 97.5 F (36.4 C) (11/23 1626) Temp Source: Oral (11/23 1626) BP: 167/56 (11/23 1626) Pulse Rate: 58 (11/23 1626)  Labs: Recent Labs    10/08/18 1835  10/09/18 0627 10/09/18 0704 10/09/18 0725 10/09/18 1054 10/09/18 1826  HGB 11.1*  --   --   --   --   --   --   HCT 34.8*  --   --   --   --   --   --   PLT 124*  --   --   --   --   --   --   HEPARINUNFRC  --   --   --  0.17*  --   --  0.21*  CREATININE 1.21*  --   --   --   --   --   --   TROPONINI  --    < > <0.03  --  <0.03 <0.03  --    < > = values in this interval not displayed.    Estimated Creatinine Clearance: 42 mL/min (A) (by C-G formula based on SCr of 1.21 mg/dL (H)).   Medical History: Past Medical History:  Diagnosis Date  . Anxiety   . Arthritis    "hands" (12/29/2017)  . Basal cell carcinoma of nose    removed  . Bursitis of left shoulder   . Cat scratch fever    Late 90s  . Coronary artery disease   . Depression   . Diabetes mellitus, type II, insulin dependent ([place])   . GERD (gastroesophageal reflux disease)   . H. pylori infection 2008 and 1998   treated  . Hyperlipidemia   . Hypertension   . Hypothyroidism   . Multinodular goiter   . Rotator cuff tear, left recurrent   . Urge urinary incontinence   . UTI (lower urinary tract infection) 05/2016    Assessment: 77 y/o F presents with chest pain  and dyspnia. Extensive PMH including DM, HTN, HLD, HFrEF. Starting heparin per pharmacy.. Hgb 11.1, Plts 124, Scr 1.21 on admission 10/08/18.   8 hr heparin  level subtherapeutic at 0.21 on heparin drip 1050 units/hr  No bleeding noted and no issues with heparin infusion per RN's report.   Goal of Therapy:  Heparin level 0.3-0.7 units/ml Monitor platelets by anticoagulation protocol: Yes   Plan:  Reload Heparin 2000 units bolus  Increase heparin drip to 1250 units/hr F/u 8 hr heparin level  Monitor for daily CBC/HL and bleeding   Nicole Cella, RPh Clinical Pharmacist .r 10/09/2018   7:31 PM

## 2018-10-09 NOTE — Progress Notes (Signed)
Subjective:  Denies chest pain Shortness of breath improved  Objective:  Vital Signs in the last 24 hours: Temp:  [97.4 F (36.3 C)-98.2 F (36.8 C)] 98.2 F (36.8 C) (11/23 0846) Pulse Rate:  [50-64] 64 (11/23 0846) Resp:  [13-26] 20 (11/23 0846) BP: (106-156)/(41-93) 151/46 (11/23 0846) SpO2:  [92 %-100 %] 93 % (11/23 0846) Weight:  [83.9 kg-85.2 kg] 85.2 kg (11/23 0010)  Intake/Output from previous day: 11/22 0701 - 11/23 0700 In: 76.5 [I.V.:76.5] Out: 800 [Urine:800] Intake/Output from this shift: Total I/O In: 120 [P.O.:120] Out: -   Physical Exam: Constitutional: She is oriented to person, place, and time. She appears well-developed and well-nourished. She appears distressed.  HENT:  Head: Atraumatic.  Eyes: Conjunctivae are normal.  Neck: JVD present.  Cardiovascular: Normal rate.  Murmur (II/VI SEM in MA and AA ) heard. Pulses:      Carotid pulses are 3+ on the right side, and 3+ on the left side.      Radial pulses are 3+ on the right side, and 3+ on the left side.       Femoral pulses are 3+ on the right side, and 3+ on the left side.      Popliteal pulses are 2+ on the right side, and 2+ on the left side.       Dorsalis pedis pulses are 0 on the right side, and 0 on the left side.       Posterior tibial pulses are 0 on the right side, and 0 on the left side.  Pulmonary/Chest: Effort normal and breath sounds normal.  Abdominal: Soft. Bowel sounds are normal.  Musculoskeletal: Normal range of motion. She exhibits 2+ edema  Neurological: She is alert and oriented to person, place, and time.  Skin: Skin is warm and dry.   Lab Results: Recent Labs    10/08/18 1835  WBC 6.0  HGB 11.1*  PLT 124*   Recent Labs    10/08/18 1835  NA 137  K 3.7  CL 108  CO2 21*  GLUCOSE 306*  BUN 25*  CREATININE 1.21*   Recent Labs    10/09/18 0627 10/09/18 0725  TROPONINI <0.03 <0.03   CARDIAC STUDIES:  EKG 10/08/2018: Normal sinus rhythm 56 bpm, normal axis.   Intermittent left bundle branch block, not new.  Echocardiogram 04/26/2018: Normal LV size, moderate LVH, EF 48-27%, grade 2 diastolic dysfunction, mild to moderate AI, mild to moderate MR, mild pulmonary hypertension.  Cath 04/26/2018: LM: Normal LAD: Patent ostial LAD stent LCx: Mid 40% stenosis Ramus intermedius: Bifurcation 1,1,1 stenosis  Successful percutaneous coronary intervention      PTCA and stenting Resolute 2.0 X 12 mm Ramus intermedius, 0% residual stenosis, TIMI III flow     PTCA Ramus side branch with 75% ostial stenosis with TIMI III flow RCA: Mild nonobstructive disease mid RCA LVEDP 17 mmHg   Assessment/Plan:  77 y/o Caucasian female with hypertension, uncontrolled type 2 DM,  CAD s/p prior LAD and ramus PCI,  HFpEF, recent RLE cellulitis,  admitted with chest pain and shortness of breath  Chest pain, shortness of breath: Nitrate responsive chest pain for 1 hour. However, no significant troponin elevation, with only one trop at 0.03.. Subsequent troponins are negative. While she could have had unstable angina, primary presentation appears to be acute on chronic HFpEF. Recommend diuresis, along with heparin and DAPT (Aspirin and Effient for now. Patient may switch to plavix after completing her Effient bottle at home, as she is  unable to afford copay). If she has recurrent chest pain, may consider cath on Monday 11/25. Otherwise, continue medical management.She may ambulate using her surgical boot.  HFpEF: IV lasix 40 mg daily.  CAD: Continue baseline antianginal treatment  DM: Insulin coverage. Patient is on no basal nighttime insuin  Gastroparesis: Stable.     LOS: 1 day    Taj Nevins J Marcha Licklider 10/09/2018, 11:31 AM  [place], MD [facility] [place] Pager: [phone] Office: [phone] If no answer Cell [phone]

## 2018-10-09 NOTE — Progress Notes (Signed)
Patient ambulated to end of hall and back. No complaints of cp. o2 sat 95-97.

## 2018-10-10 ENCOUNTER — Other Ambulatory Visit: Payer: Self-pay

## 2018-10-10 DIAGNOSIS — R079 Chest pain, unspecified: Secondary | ICD-10-CM | POA: Diagnosis not present

## 2018-10-10 DIAGNOSIS — I5033 Acute on chronic diastolic (congestive) heart failure: Secondary | ICD-10-CM | POA: Diagnosis not present

## 2018-10-10 DIAGNOSIS — N823 Fistula of vagina to large intestine: Secondary | ICD-10-CM | POA: Diagnosis not present

## 2018-10-10 DIAGNOSIS — I130 Hypertensive heart and chronic kidney disease with heart failure and stage 1 through stage 4 chronic kidney disease, or unspecified chronic kidney disease: Secondary | ICD-10-CM | POA: Diagnosis not present

## 2018-10-10 DIAGNOSIS — N183 Chronic kidney disease, stage 3 (moderate): Secondary | ICD-10-CM | POA: Diagnosis not present

## 2018-10-10 LAB — BASIC METABOLIC PANEL
Anion gap: 9 (ref 5–15)
BUN: 23 mg/dL (ref 8–23)
CHLORIDE: 106 mmol/L (ref 98–111)
CO2: 24 mmol/L (ref 22–32)
CREATININE: 1.11 mg/dL — AB (ref 0.44–1.00)
Calcium: 8.6 mg/dL — ABNORMAL LOW (ref 8.9–10.3)
GFR calc Af Amer: 54 mL/min — ABNORMAL LOW (ref >60)
GFR calc non Af Amer: 47 mL/min — ABNORMAL LOW (ref >60)
GLUCOSE: 217 mg/dL — AB (ref 70–99)
Potassium: 3.3 mmol/L — ABNORMAL LOW (ref 3.5–5.1)
Sodium: 139 mmol/L (ref 135–145)

## 2018-10-10 LAB — CBC
HCT: 31.8 % — ABNORMAL LOW (ref 36.0–46.0)
Hemoglobin: 10.5 g/dL — ABNORMAL LOW (ref 12.0–15.0)
MCH: 30.7 pg (ref 26.0–34.0)
MCHC: 33 g/dL (ref 30.0–36.0)
MCV: 93 fL (ref 80.0–100.0)
PLATELETS: 102 10*3/uL — AB (ref 150–400)
RBC: 3.42 MIL/uL — ABNORMAL LOW (ref 3.87–5.11)
RDW: 13.5 % (ref 11.5–15.5)
WBC: 4.8 10*3/uL (ref 4.0–10.5)
nRBC: 0 % (ref 0.0–0.2)

## 2018-10-10 LAB — GLUCOSE, CAPILLARY
Glucose-Capillary: 196 mg/dL — ABNORMAL HIGH (ref 70–99)
Glucose-Capillary: 184 mg/dL — ABNORMAL HIGH (ref 70–99)

## 2018-10-10 LAB — BRAIN NATRIURETIC PEPTIDE: B Natriuretic Peptide: 282.6 pg/mL — ABNORMAL HIGH (ref 0.0–100.0)

## 2018-10-10 LAB — HEPARIN LEVEL (UNFRACTIONATED): HEPARIN UNFRACTIONATED: 0.33 [IU]/mL (ref 0.30–0.70)

## 2018-10-10 MED ORDER — POTASSIUM CHLORIDE CRYS ER 20 MEQ PO TBCR
20.0000 meq | EXTENDED_RELEASE_TABLET | Freq: Once | ORAL | Status: AC
  Administered 2018-10-10: 20 meq via ORAL
  Filled 2018-10-10: qty 1

## 2018-10-10 MED ORDER — SPIRONOLACTONE 50 MG PO TABS: 50.0000 mg | ORAL_TABLET | Freq: Every day | ORAL | 3 refills | Status: DC

## 2018-10-10 NOTE — Progress Notes (Signed)
Discussed all discharge instructions and medications with patient.   Sent instruction home.

## 2018-10-10 NOTE — Discharge Summary (Signed)
Physician Discharge Summary  [name] MRN: [id number] DOB/AGE: 1941-05-23 77 y.o.  Admit date: 10/08/2018 Discharge date: 10/10/2018  Admission Diagnoses:  Discharge Diagnoses:  Active Problems:   HYPERTENSION, BENIGN SYSTEMIC   Pulmonary edema   Acute on chronic diastolic heart failure [facility])   Discharged Condition: good  Hospital Course:   77 y/o Caucasian female with hypertension, uncontrolled type 2 DM,  CAD s/p prior LAD and ramus PCI,  HFpEF, recent RLE cellulitis,  admitted with chest pain and shortness of breath.  Patient had only one troponin of 0.03 ng/mL, all other troponins were negative. BNP was elevated at 432 and reduced with diuresis. It was felt that her presentation was most consistent with acute on chronic diastolic heart failure. Patient ambulated in the hallway with no chest pain, shortness of breath, or hypoxia. Her spironlactone was increased from 25 mg to 50 mg on discharge. Patient should continue Effient until she finishes her current bottle. After that, she will start taking plavix 75 mg daily, as she is unable to afford copay for Effient.   Consults: None  Significant Diagnostic Studies: labs:  Results for Sara Graves (MRN [id number]) as of 10/10/2018 16:49  Ref. Range 10/08/2018 18:35 10/10/2018 04:18  WBC Latest Ref Range: 4.0 - 10.5 K/uL 6.0 4.8  RBC Latest Ref Range: 3.87 - 5.11 MIL/uL 3.69 (L) 3.42 (L)  Hemoglobin Latest Ref Range: 12.0 - 15.0 g/dL 11.1 (L) 10.5 (L)  HCT Latest Ref Range: 36.0 - 46.0 % 34.8 (L) 31.8 (L)  MCV Latest Ref Range: 80.0 - 100.0 fL 94.3 93.0  MCH Latest Ref Range: 26.0 - 34.0 pg 30.1 30.7  MCHC Latest Ref Range: 30.0 - 36.0 g/dL 31.9 33.0  RDW Latest Ref Range: 11.5 - 15.5 % 13.2 13.5  Platelets Latest Ref Range: 150 - 400 K/uL 124 (L) 102 (L)  nRBC Latest Ref Range: 0.0 - 0.2 % 0.0 0.0   Results for Sara Graves (MRN [id number]) as of 10/10/2018 16:49  Ref. Range 10/08/2018 19:18 10/08/2018  22:17 10/08/2018 23:49 10/09/2018 06:27 10/09/2018 07:25 10/09/2018 10:54 10/10/2018 04:18 10/10/2018 47:09  BASIC METABOLIC PANEL Unknown        Rpt (A)  Sodium Latest Ref Range: 135 - 145 mmol/L        139  Potassium Latest Ref Range: 3.5 - 5.1 mmol/L        3.3 (L)  Chloride Latest Ref Range: 98 - 111 mmol/L        106  CO2 Latest Ref Range: 22 - 32 mmol/L        24  Glucose Latest Ref Range: 70 - 99 mg/dL        217 (H)  Mean Plasma Glucose Latest Units: mg/dL   205.86       BUN Latest Ref Range: 8 - 23 mg/dL        23  Creatinine Latest Ref Range: 0.44 - 1.00 mg/dL        1.11 (H)  Calcium Latest Ref Range: 8.9 - 10.3 mg/dL        8.6 (L)  Anion gap Latest Ref Range: 5 - 15         9  GFR, Est Non African American Latest Ref Range: >60 mL/min        47 (L)  GFR, Est African American Latest Ref Range: >60 mL/min        54 (L)  B Natriuretic Peptide Latest Ref Range: 0.0 - 100.0  pg/mL    432.8 (H)   282.6 (H)   Troponin I Latest Ref Range: <0.03 ng/mL   0.03 (HH) <0.03 <0.03 <0.03    Troponin i, poc Latest Ref Range: 0.00 - 0.08 ng/mL 0.03 0.03         Treatments: IV heparin IV diuresis  Discharge Exam: Blood pressure (!) 143/41, pulse (!) 54, temperature 98.2 F (36.8 C), temperature source Oral, resp. rate 18, height 5\' 5"  (1.651 m), weight 82.9 kg, SpO2 94 %.   Constitutional: She isoriented to person, place, and time. She appearswell-developedand well-nourished. She appearsin no distress  HENT:  Head:Atraumatic.  Eyes:Conjunctivaeare normal.  Neck:No appreciable JVD.  Cardiovascular:Normal rate. Murmur(II/VI SEM in MA and AA )heard. Pulses: Carotid pulses are 3+on the right side, and 3+on the left side. Radial pulses are 3+on the right side, and 3+on the left side.  Femoral pulses are 3+on the right side, and 3+on the left side. Popliteal pulses are 2+on the right side, and 2+on the left side.  Dorsalis pedis pulses are 0on  the right side, and 0on the left side.  Posterior tibial pulses are 0on the right side, and 0on the left side.  Pulmonary/Chest:Effort normaland breath sounds normal.  Abdominal:Soft.Bowel sounds are normal.  Musculoskeletal:Normal range of motion. She exhibitstrace edmea Neurological: She isalertand oriented to person, place, and time.  Skin: Skin iswarmand dry.  Disposition: Discharge disposition: 01-Home or Self Care       Discharge Instructions    Diet - low sodium heart healthy   Complete by:  As directed    Increase activity slowly   Complete by:  As directed      Allergies as of 10/10/2018      Reactions   Rosiglitazone Maleate Anaphylaxis, Swelling   Morphine Other (See Comments)   SEVERE HYPOTENSION   Cephalexin Diarrhea   Sulfa Antibiotics Diarrhea, Nausea And Vomiting   Tramadol Other (See Comments)   Unknown reaction   Elavil [amitriptyline] Nausea Only   Tramadol-acetaminophen Rash      Medication List    TAKE these medications   acetaminophen 500 MG tablet Commonly known as:  TYLENOL Take 500-1,000 mg by mouth every 6 (six) hours as needed for headache (pain).   amLODipine 5 MG tablet Commonly known as:  NORVASC Take 1 tablet (5 mg total) by mouth daily.   aspirin EC 81 MG tablet Take 81 mg by mouth daily.   busPIRone 7.5 MG tablet Commonly known as:  BUSPAR Take 7.5 mg by mouth 2 (two) times daily.   CALCIUM 600 + D PO Take 600 mg by mouth daily.   clopidogrel 75 MG tablet Commonly known as:  PLAVIX Take 75 mg by mouth daily.   ezetimibe 10 MG tablet Commonly known as:  ZETIA Take 10 mg by mouth daily.   FLUoxetine 20 MG capsule Commonly known as:  PROZAC Take 20 mg by mouth daily.   insulin lispro 100 UNIT/ML injection Commonly known as:  HUMALOG Inject 2-6 Units into the skin See admin instructions. For use with V - GO 20 Insulin Delivery Device: inject 6 units subcutaneously three times daily before meals,  inject 2 units at bedtime if eating a bedtime snack   Investigational - Study Medication Take 1 tablet by mouth daily. Study name: dapagliflozin/metformin ER 10/1000mg  Additional study details: This is a Drug Study medication from Dr. Nadyne Coombes at [facility]. Patient started taking this medication on 04/22/18 and is unsure of how long she is to take this  medication for. Per patient, she is on this medication as part of a Heart and Diabetes drug study.   isosorbide mononitrate 60 MG 24 hr tablet Commonly known as:  IMDUR Take 60 mg by mouth daily.   labetalol 100 MG tablet Commonly known as:  NORMODYNE Take 100 mg by mouth 2 (two) times daily.   levothyroxine 50 MCG tablet Commonly known as:  SYNTHROID, LEVOTHROID Take 50 mcg by mouth daily before breakfast.   nitroGLYCERIN 0.4 MG SL tablet Commonly known as:  NITROSTAT Place 1 tablet (0.4 mg total) under the tongue every 5 (five) minutes x 3 doses as needed for chest pain.   ONETOUCH VERIO test strip Generic drug:  glucose blood USE 1 STRIP 3 (THREE) TIMES A DAY.   ONETOUCH VERIO test strip Generic drug:  glucose blood USE 1 STRIP 3 (THREE) TIMES A DAY.   oxyCODONE 5 MG immediate release tablet Commonly known as:  Oxy IR/ROXICODONE Take 1 tablet (5 mg total) by mouth every 6 (six) hours as needed for moderate pain.   pantoprazole 40 MG tablet Commonly known as:  PROTONIX Take 1 tablet (40 mg total) by mouth 2 (two) times daily. What changed:    when to take this  reasons to take this   potassium chloride SA 20 MEQ tablet Commonly known as:  K-DUR,KLOR-CON Take 1 tablet (20 mEq total) by mouth 2 (two) times daily. With Torsemide as needed for leg swelling What changed:    when to take this  reasons to take this  additional instructions   prasugrel 10 MG Tabs tablet Commonly known as:  EFFIENT Take 1 tablet (10 mg total) by mouth daily.   rosuvastatin 40 MG tablet Commonly known as:  CRESTOR Take 1  tablet (40 mg total) by mouth at bedtime.   spironolactone 50 MG tablet Commonly known as:  ALDACTONE Take 1 tablet (50 mg total) by mouth daily. May use 2 tabs of 25 mg daily for now What changed:    medication strength  how much to take  additional instructions   torsemide 20 MG tablet Commonly known as:  DEMADEX Take 20 mg by mouth 2 (two) times daily as needed (leg swelling).   vitamin B-12 1000 MCG tablet Commonly known as:  CYANOCOBALAMIN Take 1,000 mcg by mouth daily.   Vitamin D (Ergocalciferol) 1.25 MG (50000 UT) Caps capsule Commonly known as:  DRISDOL Take 50,000 Units by mouth every Thursday.        SignedNigel Mormon 10/10/2018, 12:23 PM  Kameria Canizares Esther Hardy, MD [facility] [place] Pager: [phone] Office: [phone] If no answer Cell [phone]

## 2018-10-10 NOTE — Progress Notes (Signed)
[facility] for Heparin  Indication: chest pain/ACS  Allergies  Allergen Reactions  . Rosiglitazone Maleate Anaphylaxis and Swelling  . Morphine Other (See Comments)    SEVERE HYPOTENSION   . Cephalexin Diarrhea  . Sulfa Antibiotics Diarrhea and Nausea And Vomiting  . Tramadol Other (See Comments)    Unknown reaction  . Elavil [Amitriptyline] Nausea Only  . Tramadol-Acetaminophen Rash   Patient Measurements: Height: 5\' 5"  (165.1 cm) Weight: 187 lb 14.4 oz (85.2 kg) IBW/kg (Calculated) : 57  Vital Signs: Temp: 98.4 F (36.9 C) (11/24 0050) Temp Source: Oral (11/24 0050) BP: 148/34 (11/24 0050) Pulse Rate: 51 (11/24 0050)  Labs: Recent Labs    10/08/18 1835  10/09/18 0627 10/09/18 0704 10/09/18 0725 10/09/18 1054 10/09/18 1826 10/10/18 0418  HGB 11.1*  --   --   --   --   --   --   --   HCT 34.8*  --   --   --   --   --   --   --   PLT 124*  --   --   --   --   --   --   --   HEPARINUNFRC  --   --   --  0.17*  --   --  0.21* 0.33  CREATININE 1.21*  --   --   --   --   --   --   --   TROPONINI  --    < > <0.03  --  <0.03 <0.03  --   --    < > = values in this interval not displayed.    Estimated Creatinine Clearance: 42 mL/min (A) (by C-G formula based on SCr of 1.21 mg/dL (H)).   Medical History: Past Medical History:  Diagnosis Date  . Anxiety   . Arthritis    "hands" (12/29/2017)  . Basal cell carcinoma of nose    removed  . Bursitis of left shoulder   . Cat scratch fever    Late 90s  . Coronary artery disease   . Depression   . Diabetes mellitus, type II, insulin dependent ([place])   . GERD (gastroesophageal reflux disease)   . H. pylori infection 2008 and 1998   treated  . Hyperlipidemia   . Hypertension   . Hypothyroidism   . Multinodular goiter   . Rotator cuff tear, left recurrent   . Urge urinary incontinence   . UTI (lower urinary tract infection) 05/2016    Assessment: 77 y/o F with extensive PMH  here with CP/dyspnea. Starting heparin per pharmacy. PTA meds have been reviewed. Hgb 11.1. Plts 124. Scr 1.21.   11/24 AM update: heparin level therapeutic after rate increase  Goal of Therapy:  Heparin level 0.3-0.7 units/ml Monitor platelets by anticoagulation protocol: Yes   Plan:  Cont heparin at 1250 units/hr Confirmatory heparin level at 1200 Daily CBC/HL Monitor for bleeding   Narda Bonds 10/10/2018,5:00 AM

## 2018-10-18 ENCOUNTER — Ambulatory Visit (INDEPENDENT_AMBULATORY_CARE_PROVIDER_SITE_OTHER): Payer: Medicare Other | Admitting: Podiatry

## 2018-10-18 ENCOUNTER — Ambulatory Visit (INDEPENDENT_AMBULATORY_CARE_PROVIDER_SITE_OTHER): Payer: Medicare Other

## 2018-10-18 DIAGNOSIS — L97519 Non-pressure chronic ulcer of other part of right foot with unspecified severity: Secondary | ICD-10-CM | POA: Diagnosis not present

## 2018-10-18 DIAGNOSIS — S92344D Nondisplaced fracture of fourth metatarsal bone, right foot, subsequent encounter for fracture with routine healing: Secondary | ICD-10-CM | POA: Diagnosis not present

## 2018-10-18 DIAGNOSIS — M79671 Pain in right foot: Secondary | ICD-10-CM

## 2018-10-18 DIAGNOSIS — M84374G Stress fracture, right foot, subsequent encounter for fracture with delayed healing: Secondary | ICD-10-CM

## 2018-10-19 ENCOUNTER — Other Ambulatory Visit: Payer: Self-pay | Admitting: Podiatry

## 2018-10-19 DIAGNOSIS — S92344D Nondisplaced fracture of fourth metatarsal bone, right foot, subsequent encounter for fracture with routine healing: Secondary | ICD-10-CM

## 2018-10-21 NOTE — Progress Notes (Signed)
Subjective: 77 year old female presents the office today for follow-up evaluation of fracture of the fourth metatarsal.  Overall she states that she is doing better.  She still in the cam boot majority time she has been wearing the surgical shoe.  Since I last saw her she has been admitted to the hospital for congestive heart failure.  She did notices some small amount of bleeding to the bottom of her right foot over the last couple of days but denies any drainage or pus or any increased swelling or redness.  She states that she has lost quite a bit of weight from fluid since I last saw her.  She has no other concerns today.  No recent injury or falls. Denies any systemic complaints such as fevers, chills, nausea, vomiting. No acute changes since last appointment, and no other complaints at this time.   Objective: AAO x3, NAD DP/PT pulses palpable bilaterally, CRT less than 3 seconds Incision as well as the wound is well-healed and there is no clinical signs of infection.  On the plantar aspect on submetatarsal 5 areas hyperkeratotic lesions almost a skin fissure type of lesion but there is no probing, undermining or tunneling.  Small amount of dried blood is present.  There is no swelling erythema, ascending sialitis.  There is no fluctuation or crepitation or any signs of infection present today.  Mild swelling to the right foot there is no erythema or warmth.  There is mild medial deviation of the toes.  No other areas of tenderness.  No open lesions or pre-ulcerative lesions.  No pain with calf compression, swelling, warmth, erythema  Assessment: Right fourth metatarsal fracture with improvement; ulcerative area right submetatarsal 5  Plan: -All treatment options discussed with the patient including all alternatives, risks, complications.  -X-rays were obtained reviewed.  Fracture of the fourth metatarsal diaphysis with mild displacement with some increase in bone callus formation compared to last  appointment.  Medial deviation of the toes is present. -Regards to the fracture she is doing better.  Would continue the surgical shoe for now with a cam boot.  Continue ice elevate as well. -The area of bleeding on the right foot the metatarsal 5 as well as a skin fissure type lesion.  I did clean the area.  Recommend a small amount of antibiotic ointment dressing changes daily.  Watch for any worsening or any other signs or symptoms of infection.  Trula Slade DPM

## 2018-10-29 ENCOUNTER — Ambulatory Visit: Payer: Medicare Other | Admitting: Podiatry

## 2018-11-02 ENCOUNTER — Ambulatory Visit: Payer: Medicare Other | Admitting: Podiatry

## 2018-11-02 ENCOUNTER — Encounter: Payer: Self-pay | Admitting: Podiatry

## 2018-11-02 ENCOUNTER — Ambulatory Visit (INDEPENDENT_AMBULATORY_CARE_PROVIDER_SITE_OTHER): Payer: Medicare Other

## 2018-11-02 VITALS — BP 156/65 | HR 54 | Resp 16

## 2018-11-02 DIAGNOSIS — M84374G Stress fracture, right foot, subsequent encounter for fracture with delayed healing: Secondary | ICD-10-CM | POA: Diagnosis not present

## 2018-11-02 DIAGNOSIS — T148XXA Other injury of unspecified body region, initial encounter: Secondary | ICD-10-CM

## 2018-11-12 NOTE — Progress Notes (Signed)
Subjective: 77 year old female presents the office today for follow evaluation of right foot fracture.  She states that it feels much better.  She still in the surgical shoe.  She also states that she noticed some bleeding to her left second toe where the toenail was at previously.  She not sure if she hit it.  She states that she just noticed it on Sunday night.  No swelling or redness to the area. Denies any systemic complaints such as fevers, chills, nausea, vomiting. No acute changes since last appointment, and no other complaints at this time.   Objective: AAO x3, NAD DP/PT pulses palpable bilaterally, CRT less than 3 seconds There is minimal tenderness palpation to the right foot on the fourth metatarsal and there is no significant edema or erythema to this area as well.  No other areas of tenderness in the right foot.  On the dorsal aspect the left second toe over the nailbed appears to be almost a small skin abrasion.  There was some dried blood to the area but there is no purulence.  Minimal swelling and minimal erythema.  The erythema appears to be more from inflammation as opposed to infection.  No ascending cellulitis. No open lesions or pre-ulcerative lesions.  No pain with calf compression, swelling, warmth, erythema  Assessment: Healing fracture right fourth metatarsal with superficial abrasion left second toe  Plan: -All treatment options discussed with the patient including all alternatives, risks, complications.  -X-rays were obtained reviewed on the right foot.  There is bone callus formation on the fourth metatarsal.  No evidence of acute fracture.   -On the right foot would continue the surgical shoe and offloading.  Continue to elevate.  We will keep in the surgical shoe longer just to help the area continue to heal and help avoid any further issues.  On the left foot I did clean the small skin abrasion.  I want her to use a small amount of antibiotic ointment and a bandage  daily. -Patient encouraged to call the office with any questions, concerns, change in symptoms.   Trula Slade DPM

## 2018-11-15 ENCOUNTER — Ambulatory Visit: Payer: Medicare Other | Admitting: Podiatry

## 2018-11-22 ENCOUNTER — Encounter: Payer: Self-pay | Admitting: Podiatry

## 2018-11-22 ENCOUNTER — Ambulatory Visit: Payer: Medicare Other | Admitting: Podiatry

## 2018-11-22 ENCOUNTER — Ambulatory Visit (INDEPENDENT_AMBULATORY_CARE_PROVIDER_SITE_OTHER): Payer: Medicare Other

## 2018-11-22 DIAGNOSIS — M84374G Stress fracture, right foot, subsequent encounter for fracture with delayed healing: Secondary | ICD-10-CM | POA: Diagnosis not present

## 2018-11-22 DIAGNOSIS — T148XXA Other injury of unspecified body region, initial encounter: Secondary | ICD-10-CM

## 2018-11-30 NOTE — Progress Notes (Signed)
Subjective: 78 year old female presents the office today for follow evaluation of right foot fracture as well as for a wound on the left second toe.  She states that overall she is doing better.  She has an occasional tenderness on the right foot.  She was a surgical shoe on the right side the majority time but she states that she wears a house slipper on the right side at nighttime she has minimal discomfort with this.  She states the wound left second toe is been healing well.  She denies any drainage or pus and she thinks it is healed at this point. Denies any systemic complaints such as fevers, chills, nausea, vomiting. No acute changes since last appointment, and no other complaints at this time.   Objective: AAO x3, NAD DP/PT pulses palpable bilaterally, CRT less than 3 seconds There is minimal tenderness palpation to the right foot on the fourth metatarsal and there is no significant edema to the area.  There is no open lesions.  The second toe is starting to crossover the hallux on the right side as there is medial deviation of the lesser digits.  There is no skin breakdown or irritation interdigitally.  The area of the wound on the left second toe is healed at this point.  There is no edema, erythema, drainage or pus there is no signs of infection.  No other open lesions or pre-ulcerative lesions identified. No open lesions or pre-ulcerative lesions.  No pain with calf compression, swelling, warmth, erythema  Assessment: Healing fracture right fourth metatarsal with healed superficial abrasion left second toe  Plan: -All treatment options discussed with the patient including all alternatives, risks, complications.  -X-rays were obtained reviewed on the right foot.  There is bone callus formation on the fourth metatarsal.  No evidence of acute fracture.   -In regards to the right foot as she is not having any significant pain discussed slowly transitioning to a regular shoe.  If there is any  increase in pain or swelling that she is to go back in the surgical shoe here discussed that on x-rays not completely healed however she has been immobilized for quite some time.  We will try to get back into a shoe to see how she does. -On the left foot the skin lesion appears to be healed.  Continue to monitor for reoccurrence or any signs or symptoms of infection.  Trula Slade DPM

## 2018-12-12 NOTE — Progress Notes (Signed)
Subjective:  [name], female    DOB: 02-21-1941,  MRN: [id number]  Chief Complaint  Patient presents with  . Toe Pain    Pt heard a popping sound in her right toe the other day when she was walking around her house. Pain in the right foot toes 2-5    78 y.o. female presents with the above complaint.   Review of Systems: Negative except as noted in the HPI. Denies N/V/F/Ch.  Past Medical History:  Diagnosis Date  . Anxiety   . Arthritis    "hands" (12/29/2017)  . Basal cell carcinoma of nose    removed  . Bursitis of left shoulder   . Cat scratch fever    Late 90s  . Coronary artery disease   . Depression   . Diabetes mellitus, type II, insulin dependent ([place])   . GERD (gastroesophageal reflux disease)   . H. pylori infection 2008 and 1998   treated  . Hyperlipidemia   . Hypertension   . Hypothyroidism   . Multinodular goiter   . Rotator cuff tear, left recurrent   . Urge urinary incontinence   . UTI (lower urinary tract infection) 05/2016    Current Outpatient Medications:  .  amLODipine (NORVASC) 5 MG tablet, Take 1 tablet (5 mg total) by mouth daily., Disp: 60 tablet, Rfl: 1 .  busPIRone (BUSPAR) 7.5 MG tablet, Take 7.5 mg by mouth 2 (two) times daily., Disp: , Rfl:  .  Calcium Carb-Cholecalciferol (CALCIUM 600 + D PO), Take 600 mg by mouth daily. , Disp: , Rfl:  .  ezetimibe (ZETIA) 10 MG tablet, Take 10 mg by mouth daily., Disp: , Rfl:  .  glucose blood (ONETOUCH VERIO) test strip, USE 1 STRIP 3 (THREE) TIMES A DAY., Disp: , Rfl:  .  Investigational - Study Medication, Take 1 tablet by mouth daily. Study name: dapagliflozin/metformin ER 10/1000mg  Additional study details: This is a Drug Study medication from Dr. Nadyne Coombes at [facility]. Patient started taking this medication on 04/22/18 and is unsure of how long she is to take this medication for. Per patient, she is on this medication as part of a Heart and Diabetes drug study., Disp: 1 each,  Rfl: PRN .  isosorbide mononitrate (IMDUR) 60 MG 24 hr tablet, Take 60 mg by mouth daily., Disp: , Rfl:  .  labetalol (NORMODYNE) 100 MG tablet, Take 100 mg by mouth 2 (two) times daily., Disp: , Rfl:  .  levothyroxine (SYNTHROID, LEVOTHROID) 50 MCG tablet, Take 50 mcg by mouth daily before breakfast. , Disp: , Rfl:  .  nitroGLYCERIN (NITROSTAT) 0.4 MG SL tablet, Place 1 tablet (0.4 mg total) under the tongue every 5 (five) minutes x 3 doses as needed for chest pain., Disp: 30 tablet, Rfl: 1 .  ONETOUCH VERIO test strip, USE 1 STRIP 3 (THREE) TIMES A DAY., Disp: , Rfl: 5 .  pantoprazole (PROTONIX) 40 MG tablet, Take 1 tablet (40 mg total) by mouth 2 (two) times daily. (Patient taking differently: Take 40 mg by mouth daily as needed (acid reflux). ), Disp: 60 tablet, Rfl: 1 .  potassium chloride SA (KLOR-CON M20) 20 MEQ tablet, Take 1 tablet (20 mEq total) by mouth 2 (two) times daily. With Torsemide as needed for leg swelling (Patient taking differently: Take 20 mEq by mouth 2 (two) times daily as needed (with each dose of torsemide). ), Disp: 60 tablet, Rfl: 0 .  prasugrel (EFFIENT) 10 MG TABS tablet, Take 1 tablet (  10 mg total) by mouth daily., Disp: 30 tablet, Rfl: 6 .  rosuvastatin (CRESTOR) 40 MG tablet, Take 1 tablet (40 mg total) by mouth at bedtime., Disp: 60 tablet, Rfl: 3 .  torsemide (DEMADEX) 20 MG tablet, Take 20 mg by mouth 2 (two) times daily as needed (leg swelling). , Disp: , Rfl: 3 .  Vitamin D, Ergocalciferol, (DRISDOL) 50000 units CAPS capsule, Take 50,000 Units by mouth every Thursday. , Disp: , Rfl:  .  acetaminophen (TYLENOL) 500 MG tablet, Take 500-1,000 mg by mouth every 6 (six) hours as needed for headache (pain)., Disp: , Rfl:  .  aspirin EC 81 MG tablet, Take 81 mg by mouth daily., Disp: , Rfl:  .  clopidogrel (PLAVIX) 75 MG tablet, Take 75 mg by mouth daily. , Disp: , Rfl:  .  FLUoxetine (PROZAC) 10 MG capsule, , Disp: , Rfl:  .  FLUoxetine (PROZAC) 20 MG capsule, Take 20  mg by mouth daily. , Disp: , Rfl: 1 .  insulin lispro (HUMALOG) 100 UNIT/ML injection, Inject 2-6 Units into the skin See admin instructions. For use with V - GO 20 Insulin Delivery Device: inject 6 units subcutaneously three times daily before meals, inject 2 units at bedtime if eating a bedtime snack, Disp: , Rfl:  .  lisinopril (PRINIVIL,ZESTRIL) 10 MG tablet, Take 10 mg by mouth daily., Disp: , Rfl:  .  spironolactone (ALDACTONE) 50 MG tablet, Take 1 tablet (50 mg total) by mouth daily. May use 2 tabs of 25 mg daily for now, Disp: 30 tablet, Rfl: 3 .  vitamin B-12 (CYANOCOBALAMIN) 1000 MCG tablet, Take 1,000 mcg by mouth daily. , Disp: , Rfl:   Social History   Tobacco Use  Smoking Status Never Smoker  Smokeless Tobacco Never Used    Allergies  Allergen Reactions  . Rosiglitazone Maleate Anaphylaxis and Swelling  . Morphine Other (See Comments)    SEVERE HYPOTENSION   . Cephalexin Diarrhea  . Sulfa Antibiotics Diarrhea and Nausea And Vomiting  . Tramadol Other (See Comments)    Unknown reaction  . Elavil [Amitriptyline] Nausea Only  . Tramadol-Acetaminophen Rash   Objective:  There were no vitals filed for this visit. There is no height or weight on file to calculate BMI. Constitutional Well developed. Well nourished.  Vascular Dorsalis pedis pulses palpable bilaterally. Posterior tibial pulses palpable bilaterally. Capillary refill normal to all digits.  No cyanosis or clubbing noted. Pedal hair growth normal.  Neurologic Normal speech. Oriented to person, place, and time. Epicritic sensation to light touch grossly present bilaterally.  Dermatologic Nails well groomed and normal in appearance. No open wounds. No skin lesions.  Orthopedic: Normal joint ROM without pain or crepitus bilaterally. No visible deformities. POP R forefoot, edema   Radiographs: XR taken no acute fractures or dislocations Assessment:   1. Metatarsalgia, right foot   2. Localized edema     3. Pain and swelling of toe of right foot    Plan:  Patient was evaluated and treated and all questions answered.  Right Foot Contusion and Swelling -Compression wrap applied -F/u 2 weeks for re-eval -Discussed protection, rest, ice, elevation  Return in about 2 weeks (around 09/10/2018).

## 2018-12-13 ENCOUNTER — Ambulatory Visit (INDEPENDENT_AMBULATORY_CARE_PROVIDER_SITE_OTHER): Payer: Medicare Other

## 2018-12-13 ENCOUNTER — Ambulatory Visit: Payer: Medicare Other | Admitting: Podiatry

## 2018-12-13 DIAGNOSIS — M84374G Stress fracture, right foot, subsequent encounter for fracture with delayed healing: Secondary | ICD-10-CM | POA: Diagnosis not present

## 2018-12-13 DIAGNOSIS — L601 Onycholysis: Secondary | ICD-10-CM | POA: Diagnosis not present

## 2018-12-13 DIAGNOSIS — T148XXA Other injury of unspecified body region, initial encounter: Secondary | ICD-10-CM

## 2018-12-13 MED ORDER — MUPIROCIN 2 % EX OINT: 1.0000 "application " | TOPICAL_OINTMENT | Freq: Two times a day (BID) | CUTANEOUS | 2 refills | Status: DC

## 2018-12-13 NOTE — Progress Notes (Signed)
Subjective: 78 year old female presents the office today for follow evaluation of right foot fracture as well as for a wound on the left second toe.  She said left foot is doing well however she has new concern of her right fifth toenail becoming loose.  Since I last saw her she transition back to regular shoe when she was doing well just having no pain to her foot or any swelling recently she did hit her right fifth toe last night and the back pain so she went back into the surgical shoe of the nail was loose.  She thinks the nails may come off.  She has no pain to the area today she denies any drainage or pus or any red streaks.  She has no other concerns.Denies any systemic complaints such as fevers, chills, nausea, vomiting. No acute changes since last appointment, and no other complaints at this time.   Objective: AAO x3, NAD DP/PT pulses palpable bilaterally, CRT less than 3 seconds There is no tenderness palpation to the right foot on the fourth metatarsal and there is no significant edema to the area.  The right fifth digit toenail is loose and only attached on the very proximal nail border.  Upon debridement there is a small amount of drainage which is clear under the toenail was able to debride this and the nailbed is intact but any lacerations.  Only small bleeding occurred.  Minimal edema to the toe there is no erythema or warmth there is no fluctuation or crepitation any ascending cellulitis. No open lesions or pre-ulcerative lesions.  No pain with calf compression, swelling, warmth, erythema  Assessment: Healing fracture right fourth metatarsal with healed superficial abrasion left second toe; right fifth toe contusion with Onikul lysis  Plan: -All treatment options discussed with the patient including all alternatives, risks, complications.  -X-rays were obtained reviewed on the right foot.  There is bone callus formation on the fourth metatarsal.  No evidence of acute fracture.    -Today I removed the right fifth digit toenail with any complications.  Clean the area.  I want her to use the Pearson ointment dressing changes twice a day.  Monitor for any signs or symptoms of infection.  Otherwise her right foot is been doing well.  She can remain in surgical shoe for now but as the area heals she can go back into her regular shoe.  The left foot is doing well at this time as well.  We will continue to monitor closely.  Return in about 2 weeks (around 12/27/2018).  Trula Slade DPM

## 2018-12-30 ENCOUNTER — Ambulatory Visit: Payer: Medicare Other | Admitting: Podiatry

## 2019-01-05 ENCOUNTER — Other Ambulatory Visit (HOSPITAL_COMMUNITY): Payer: Self-pay | Admitting: Cardiology

## 2019-01-20 ENCOUNTER — Other Ambulatory Visit: Payer: Self-pay

## 2019-01-20 ENCOUNTER — Telehealth: Payer: Self-pay

## 2019-01-20 ENCOUNTER — Emergency Department (HOSPITAL_COMMUNITY): Payer: Medicare Other

## 2019-01-20 ENCOUNTER — Inpatient Hospital Stay (HOSPITAL_COMMUNITY)
Admission: EM | Admit: 2019-01-20 | Discharge: 2019-01-23 | DRG: 291 | Disposition: A | Discharge status: Home / Self Care | Payer: Medicare Other | Attending: Student in an Organized Health Care Education/Training Program | Admitting: Student in an Organized Health Care Education/Training Program

## 2019-01-20 ENCOUNTER — Encounter (HOSPITAL_COMMUNITY): Payer: Self-pay | Admitting: Emergency Medicine

## 2019-01-20 DIAGNOSIS — I2510 Atherosclerotic heart disease of native coronary artery without angina pectoris: Secondary | ICD-10-CM | POA: Diagnosis present

## 2019-01-20 DIAGNOSIS — Z8042 Family history of malignant neoplasm of prostate: Secondary | ICD-10-CM | POA: Diagnosis not present

## 2019-01-20 DIAGNOSIS — IMO0002 Reserved for concepts with insufficient information to code with codable children: Secondary | ICD-10-CM | POA: Diagnosis present

## 2019-01-20 DIAGNOSIS — K3184 Gastroparesis: Secondary | ICD-10-CM | POA: Diagnosis present

## 2019-01-20 DIAGNOSIS — R0602 Shortness of breath: Secondary | ICD-10-CM | POA: Diagnosis present

## 2019-01-20 DIAGNOSIS — E039 Hypothyroidism, unspecified: Secondary | ICD-10-CM | POA: Diagnosis present

## 2019-01-20 DIAGNOSIS — F419 Anxiety disorder, unspecified: Secondary | ICD-10-CM | POA: Diagnosis present

## 2019-01-20 DIAGNOSIS — E785 Hyperlipidemia, unspecified: Secondary | ICD-10-CM | POA: Diagnosis present

## 2019-01-20 DIAGNOSIS — I420 Dilated cardiomyopathy: Secondary | ICD-10-CM | POA: Diagnosis present

## 2019-01-20 DIAGNOSIS — E1165 Type 2 diabetes mellitus with hyperglycemia: Secondary | ICD-10-CM | POA: Diagnosis present

## 2019-01-20 DIAGNOSIS — G7100 Muscular dystrophy, unspecified: Secondary | ICD-10-CM | POA: Diagnosis present

## 2019-01-20 DIAGNOSIS — Z794 Long term (current) use of insulin: Secondary | ICD-10-CM | POA: Diagnosis not present

## 2019-01-20 DIAGNOSIS — Z993 Dependence on wheelchair: Secondary | ICD-10-CM

## 2019-01-20 DIAGNOSIS — Z79890 Hormone replacement therapy (postmenopausal): Secondary | ICD-10-CM

## 2019-01-20 DIAGNOSIS — R51 Headache: Secondary | ICD-10-CM | POA: Diagnosis present

## 2019-01-20 DIAGNOSIS — I351 Nonrheumatic aortic (valve) insufficiency: Secondary | ICD-10-CM | POA: Diagnosis not present

## 2019-01-20 DIAGNOSIS — K219 Gastro-esophageal reflux disease without esophagitis: Secondary | ICD-10-CM | POA: Diagnosis present

## 2019-01-20 DIAGNOSIS — J9601 Acute respiratory failure with hypoxia: Secondary | ICD-10-CM | POA: Diagnosis present

## 2019-01-20 DIAGNOSIS — F339 Major depressive disorder, recurrent, unspecified: Secondary | ICD-10-CM | POA: Diagnosis not present

## 2019-01-20 DIAGNOSIS — I5023 Acute on chronic systolic (congestive) heart failure: Secondary | ICD-10-CM | POA: Diagnosis present

## 2019-01-20 DIAGNOSIS — Z79899 Other long term (current) drug therapy: Secondary | ICD-10-CM

## 2019-01-20 DIAGNOSIS — Z955 Presence of coronary angioplasty implant and graft: Secondary | ICD-10-CM

## 2019-01-20 DIAGNOSIS — I255 Ischemic cardiomyopathy: Secondary | ICD-10-CM | POA: Diagnosis present

## 2019-01-20 DIAGNOSIS — Z8249 Family history of ischemic heart disease and other diseases of the circulatory system: Secondary | ICD-10-CM

## 2019-01-20 DIAGNOSIS — Z85828 Personal history of other malignant neoplasm of skin: Secondary | ICD-10-CM

## 2019-01-20 DIAGNOSIS — Z7902 Long term (current) use of antithrombotics/antiplatelets: Secondary | ICD-10-CM | POA: Diagnosis not present

## 2019-01-20 DIAGNOSIS — I340 Nonrheumatic mitral (valve) insufficiency: Secondary | ICD-10-CM | POA: Diagnosis not present

## 2019-01-20 DIAGNOSIS — F329 Major depressive disorder, single episode, unspecified: Secondary | ICD-10-CM | POA: Diagnosis present

## 2019-01-20 DIAGNOSIS — E1143 Type 2 diabetes mellitus with diabetic autonomic (poly)neuropathy: Secondary | ICD-10-CM | POA: Diagnosis present

## 2019-01-20 DIAGNOSIS — Z7982 Long term (current) use of aspirin: Secondary | ICD-10-CM | POA: Diagnosis not present

## 2019-01-20 DIAGNOSIS — Z803 Family history of malignant neoplasm of breast: Secondary | ICD-10-CM

## 2019-01-20 DIAGNOSIS — I447 Left bundle-branch block, unspecified: Secondary | ICD-10-CM | POA: Diagnosis present

## 2019-01-20 DIAGNOSIS — I509 Heart failure, unspecified: Secondary | ICD-10-CM

## 2019-01-20 DIAGNOSIS — I252 Old myocardial infarction: Secondary | ICD-10-CM

## 2019-01-20 DIAGNOSIS — I110 Hypertensive heart disease with heart failure: Principal | ICD-10-CM | POA: Diagnosis present

## 2019-01-20 DIAGNOSIS — I5041 Acute combined systolic (congestive) and diastolic (congestive) heart failure: Secondary | ICD-10-CM

## 2019-01-20 LAB — BASIC METABOLIC PANEL
Anion gap: 9 (ref 5–15)
BUN: 16 mg/dL (ref 8–23)
CALCIUM: 8.8 mg/dL — AB (ref 8.9–10.3)
CO2: 21 mmol/L — AB (ref 22–32)
CREATININE: 1.11 mg/dL — AB (ref 0.44–1.00)
Chloride: 111 mmol/L (ref 98–111)
GFR calc Af Amer: 55 mL/min — ABNORMAL LOW (ref >60)
GFR, EST NON AFRICAN AMERICAN: 48 mL/min — AB (ref >60)
Glucose, Bld: 183 mg/dL — ABNORMAL HIGH (ref 70–99)
Potassium: 3.5 mmol/L (ref 3.5–5.1)
Sodium: 141 mmol/L (ref 135–145)

## 2019-01-20 LAB — HEPATIC FUNCTION PANEL
ALK PHOS: 68 U/L (ref 38–126)
ALT: 16 U/L (ref 0–44)
AST: 17 U/L (ref 15–41)
Albumin: 3.7 g/dL (ref 3.5–5.0)
Bilirubin, Direct: 0.2 mg/dL (ref 0.0–0.2)
Indirect Bilirubin: 0.9 mg/dL (ref 0.3–0.9)
Total Bilirubin: 1.1 mg/dL (ref 0.3–1.2)
Total Protein: 6.2 g/dL — ABNORMAL LOW (ref 6.5–8.1)

## 2019-01-20 LAB — CBC
HCT: 34.2 % — ABNORMAL LOW (ref 36.0–46.0)
Hemoglobin: 11.1 g/dL — ABNORMAL LOW (ref 12.0–15.0)
MCH: 31.7 pg (ref 26.0–34.0)
MCHC: 32.5 g/dL (ref 30.0–36.0)
MCV: 97.7 fL (ref 80.0–100.0)
PLATELETS: 106 10*3/uL — AB (ref 150–400)
RBC: 3.5 MIL/uL — AB (ref 3.87–5.11)
RDW: 14.4 % (ref 11.5–15.5)
WBC: 4.5 10*3/uL (ref 4.0–10.5)
nRBC: 0 % (ref 0.0–0.2)

## 2019-01-20 LAB — BRAIN NATRIURETIC PEPTIDE: B Natriuretic Peptide: 895.9 pg/mL — ABNORMAL HIGH (ref 0.0–100.0)

## 2019-01-20 LAB — I-STAT TROPONIN, ED: Troponin i, poc: 0.01 ng/mL (ref 0.00–0.08)

## 2019-01-20 LAB — TROPONIN I: Troponin I: <0.03 ng/mL (ref <0.03)

## 2019-01-20 LAB — TSH: TSH: 1.371 u[IU]/mL (ref 0.350–4.500)

## 2019-01-20 LAB — HEMOGLOBIN A1C
Hgb A1c MFr Bld: 7.8 % — ABNORMAL HIGH (ref 4.8–5.6)
Mean Plasma Glucose: 177.16 mg/dL

## 2019-01-20 MED ORDER — INSULIN ASPART 100 UNIT/ML ~~LOC~~ SOLN
0.0000 [IU] | Freq: Every day | SUBCUTANEOUS | Status: DC
  Administered 2019-01-22: 2 [IU] via SUBCUTANEOUS

## 2019-01-20 MED ORDER — ISOSORBIDE MONONITRATE ER 60 MG PO TB24
60.0000 mg | ORAL_TABLET | Freq: Every day | ORAL | Status: DC
  Administered 2019-01-21 – 2019-01-23 (×3): 60 mg via ORAL
  Filled 2019-01-20 (×3): qty 1

## 2019-01-20 MED ORDER — POLYETHYLENE GLYCOL 3350 17 G PO PACK: 17.0000 g | PACK | Freq: Every day | ORAL | Status: DC | PRN

## 2019-01-20 MED ORDER — ROSUVASTATIN CALCIUM 20 MG PO TABS
40.0000 mg | ORAL_TABLET | Freq: Every day | ORAL | Status: DC
  Administered 2019-01-20 – 2019-01-22 (×3): 40 mg via ORAL
  Filled 2019-01-20 (×3): qty 2

## 2019-01-20 MED ORDER — SPIRONOLACTONE 50 MG PO TABS
50.0000 mg | ORAL_TABLET | Freq: Every day | ORAL | Status: DC
  Administered 2019-01-21 – 2019-01-23 (×3): 50 mg via ORAL
  Filled 2019-01-20: qty 2
  Filled 2019-01-20 (×3): qty 1
  Filled 2019-01-20 (×2): qty 2

## 2019-01-20 MED ORDER — EZETIMIBE 10 MG PO TABS
10.0000 mg | ORAL_TABLET | Freq: Every day | ORAL | Status: DC
  Administered 2019-01-21 – 2019-01-23 (×3): 10 mg via ORAL
  Filled 2019-01-20 (×3): qty 1

## 2019-01-20 MED ORDER — FUROSEMIDE 10 MG/ML IJ SOLN
60.0000 mg | Freq: Once | INTRAMUSCULAR | Status: AC
  Administered 2019-01-20: 60 mg via INTRAVENOUS
  Filled 2019-01-20: qty 6

## 2019-01-20 MED ORDER — CLOPIDOGREL BISULFATE 75 MG PO TABS
75.0000 mg | ORAL_TABLET | Freq: Every day | ORAL | Status: DC
  Administered 2019-01-21 – 2019-01-22 (×2): 75 mg via ORAL
  Filled 2019-01-20: qty 1

## 2019-01-20 MED ORDER — LEVOTHYROXINE SODIUM 50 MCG PO TABS
50.0000 ug | ORAL_TABLET | Freq: Every day | ORAL | Status: DC
  Administered 2019-01-21 – 2019-01-23 (×3): 50 ug via ORAL
  Filled 2019-01-20 (×3): qty 1

## 2019-01-20 MED ORDER — LABETALOL HCL 100 MG PO TABS
100.0000 mg | ORAL_TABLET | Freq: Two times a day (BID) | ORAL | Status: DC
  Administered 2019-01-21 – 2019-01-23 (×4): 100 mg via ORAL
  Filled 2019-01-20 (×5): qty 1

## 2019-01-20 MED ORDER — ONDANSETRON HCL 4 MG/2ML IJ SOLN: 4.0000 mg | Freq: Four times a day (QID) | INTRAMUSCULAR | Status: DC | PRN

## 2019-01-20 MED ORDER — ENOXAPARIN SODIUM 40 MG/0.4ML ~~LOC~~ SOLN
40.0000 mg | SUBCUTANEOUS | Status: DC
  Administered 2019-01-20 – 2019-01-22 (×3): 40 mg via SUBCUTANEOUS
  Filled 2019-01-20 (×3): qty 0.4

## 2019-01-20 MED ORDER — ASPIRIN EC 81 MG PO TBEC
81.0000 mg | DELAYED_RELEASE_TABLET | Freq: Every day | ORAL | Status: DC
  Administered 2019-01-21 – 2019-01-23 (×3): 81 mg via ORAL
  Filled 2019-01-20 (×3): qty 1

## 2019-01-20 MED ORDER — VITAMIN B-12 1000 MCG PO TABS
1000.0000 ug | ORAL_TABLET | Freq: Every day | ORAL | Status: DC
  Administered 2019-01-21 – 2019-01-23 (×3): 1000 ug via ORAL
  Filled 2019-01-20 (×3): qty 1

## 2019-01-20 MED ORDER — ONDANSETRON HCL 4 MG PO TABS: 4.0000 mg | ORAL_TABLET | Freq: Four times a day (QID) | ORAL | Status: DC | PRN

## 2019-01-20 MED ORDER — ACETAMINOPHEN 650 MG RE SUPP: 650.0000 mg | Freq: Four times a day (QID) | RECTAL | Status: DC | PRN

## 2019-01-20 MED ORDER — AMLODIPINE BESYLATE 2.5 MG PO TABS
5.0000 mg | ORAL_TABLET | Freq: Every day | ORAL | Status: DC
  Administered 2019-01-21 – 2019-01-23 (×3): 5 mg via ORAL
  Filled 2019-01-20 (×3): qty 2

## 2019-01-20 MED ORDER — PANTOPRAZOLE SODIUM 40 MG PO TBEC: 40.0000 mg | DELAYED_RELEASE_TABLET | Freq: Every day | ORAL | Status: DC | PRN

## 2019-01-20 MED ORDER — ACETAMINOPHEN 325 MG PO TABS: 650.0000 mg | ORAL_TABLET | Freq: Four times a day (QID) | ORAL | Status: DC | PRN

## 2019-01-20 MED ORDER — INSULIN ASPART 100 UNIT/ML ~~LOC~~ SOLN
0.0000 [IU] | Freq: Three times a day (TID) | SUBCUTANEOUS | Status: DC
  Administered 2019-01-21: 2 [IU] via SUBCUTANEOUS
  Administered 2019-01-22: 5 [IU] via SUBCUTANEOUS
  Administered 2019-01-22: 3 [IU] via SUBCUTANEOUS
  Administered 2019-01-22: 2 [IU] via SUBCUTANEOUS
  Administered 2019-01-23: 3 [IU] via SUBCUTANEOUS

## 2019-01-20 MED ORDER — BUSPIRONE HCL 15 MG PO TABS
7.5000 mg | ORAL_TABLET | Freq: Two times a day (BID) | ORAL | Status: DC
  Administered 2019-01-20 – 2019-01-23 (×6): 7.5 mg via ORAL
  Filled 2019-01-20 (×2): qty 2
  Filled 2019-01-20: qty 1
  Filled 2019-01-20: qty 2
  Filled 2019-01-20: qty 1
  Filled 2019-01-20 (×3): qty 2
  Filled 2019-01-20 (×5): qty 1

## 2019-01-20 MED ORDER — FLUOXETINE HCL 20 MG PO CAPS
20.0000 mg | ORAL_CAPSULE | Freq: Every day | ORAL | Status: DC
  Administered 2019-01-21 – 2019-01-23 (×3): 20 mg via ORAL
  Filled 2019-01-20 (×3): qty 1

## 2019-01-20 NOTE — Plan of Care (Signed)
  Problem: Education: Goal: Ability to demonstrate management of disease process will improve Outcome: Progressing Goal: Ability to verbalize understanding of medication therapies will improve Outcome: Progressing   Problem: Cardiac: Goal: Ability to achieve and maintain adequate cardiopulmonary perfusion will improve Outcome: Progressing   Problem: Education: Goal: Knowledge of General Education information will improve Description Including pain rating scale, medication(s)/side effects and non-pharmacologic comfort measures Outcome: Progressing   Problem: Health Behavior/Discharge Planning: Goal: Ability to manage health-related needs will improve Outcome: Progressing   Problem: Clinical Measurements: Goal: Ability to maintain clinical measurements within normal limits will improve Outcome: Progressing Goal: Will remain free from infection Outcome: Progressing Goal: Diagnostic test results will improve Outcome: Progressing Goal: Cardiovascular complication will be avoided Outcome: Progressing   Problem: Nutrition: Goal: Adequate nutrition will be maintained Outcome: Progressing   Problem: Coping: Goal: Level of anxiety will decrease Outcome: Progressing   Problem: Elimination: Goal: Will not experience complications related to bowel motility Outcome: Progressing Goal: Will not experience complications related to urinary retention Outcome: Progressing   Problem: Pain Managment: Goal: General experience of comfort will improve Outcome: Progressing   Problem: Safety: Goal: Ability to remain free from injury will improve Outcome: Progressing   Problem: Skin Integrity: Goal: Risk for impaired skin integrity will decrease Outcome: Progressing

## 2019-01-20 NOTE — H&P (Signed)
Date: 01/20/2019               Patient Name:  Sara Graves MRN: [id number]  DOB: [birth year] Age / Sex: 78 y.o., female   PCP: Selinda Orion         Medical Service: Internal Medicine Teaching Service         Attending Physician: Dr. Evette Doffing, Mallie Mussel, *    First Contact: Dr. Myrtie Hawk Pager: [phone]  Second Contact: Dr. Philipp Ovens  Pager: [phone]       After Hours (After 5p/  First Contact Pager: [phone]  weekends / holidays): Second Contact Pager: [phone]   Chief Complaint: Shortness of breath  History of Present Illness: 30yoF with CAD, type 2 diabetes, HTN, severe gastroparesis, HFrEF presenting with progressive SHOB. States that for the past 7 days she has been having SHOB, chest congestion, dry cough, orthopnea, and LEE. It seems to be progressive in nature and is now to the point that with minimal exertion she becomes dyspneic. She takes spironolactone daily and prn torsemide which has provided some relief. She denies recent illness but does endorse 3 weeks of decreased appetite which she thinks is due to her gastroparesis. She was hospitalized 3 times in the past 12 months for a heart failure exacerbation and feels that her [facility] is very similar to those episodes. She endorses chest pressure/tightness of 1 week duration that she feels is similar to the pain she had prior to getting a stent in June of 2019.  She denies recent medications changes, sick contact, N/V, diarrhea, change in diet, decreased urinary output. She does weight herself daily and has only gained 3-4 pounds.  At baseline she is very limited by exertional dyspnea. She often cannot do much without having to stop and rest due to [facility]. She cannot walk more that 20-30 ft without having to rest. She has been like this since June of 2019 or at least that is when she really started to notice things.   Meds:  Current Meds  Medication Sig  . acetaminophen (TYLENOL) 500 MG tablet Take 500-1,000 mg by mouth  every 6 (six) hours as needed for headache (pain).  Marland Kitchen amLODipine (NORVASC) 5 MG tablet Take 1 tablet (5 mg total) by mouth daily.  Marland Kitchen aspirin EC 81 MG tablet Take 81 mg by mouth daily.  . busPIRone (BUSPAR) 7.5 MG tablet Take 7.5 mg by mouth 2 (two) times daily.  . Calcium Carb-Cholecalciferol (CALCIUM 600 + D PO) Take 600 mg by mouth daily.   . clopidogrel (PLAVIX) 75 MG tablet Take 75 mg by mouth daily.   Marland Kitchen ezetimibe (ZETIA) 10 MG tablet Take 10 mg by mouth daily.  Marland Kitchen FLUoxetine (PROZAC) 20 MG capsule Take 20 mg by mouth daily.   . insulin lispro (HUMALOG) 100 UNIT/ML injection Inject 2-6 Units into the skin See admin instructions. For use with V - GO 20 Insulin Delivery Device: inject 6 units subcutaneously three times daily before meals, inject 2 units at bedtime if eating a bedtime snack  . Investigational - Study Medication Take 1 tablet by mouth daily. Study name: dapagliflozin/metformin ER 10/1000mg  Additional study details: This is a Drug Study medication from Dr. Nadyne Coombes at [facility]. Patient started taking this medication on 04/22/18 and is unsure of how long she is to take this medication for. Per patient, she is on this medication as part of a Heart and Diabetes drug study.  . isosorbide mononitrate (IMDUR) 60 MG 24  hr tablet Take 60 mg by mouth daily.  Marland Kitchen labetalol (NORMODYNE) 100 MG tablet Take 100 mg by mouth 2 (two) times daily.  Marland Kitchen levothyroxine (SYNTHROID, LEVOTHROID) 50 MCG tablet Take 50 mcg by mouth daily before breakfast.   . nitroGLYCERIN (NITROSTAT) 0.4 MG SL tablet Place 1 tablet (0.4 mg total) under the tongue every 5 (five) minutes x 3 doses as needed for chest pain.  . pantoprazole (PROTONIX) 40 MG tablet Take 1 tablet (40 mg total) by mouth 2 (two) times daily. (Patient taking differently: Take 40 mg by mouth daily as needed (acid reflux). )  . potassium chloride SA (KLOR-CON M20) 20 MEQ tablet Take 1 tablet (20 mEq total) by mouth 2 (two) times daily. With Torsemide  as needed for leg swelling (Patient taking differently: Take 20 mEq by mouth 2 (two) times daily as needed (with each dose of torsemide). )  . prasugrel (EFFIENT) 10 MG TABS tablet Take 1 tablet (10 mg total) by mouth daily.  . rosuvastatin (CRESTOR) 40 MG tablet Take 1 tablet (40 mg total) by mouth at bedtime.  Marland Kitchen spironolactone (ALDACTONE) 50 MG tablet TAKE 1 TABLET BY MOUTH DAILY. MAY USE 2 TABLETS OF 25MG  DAILY FOR NOW (Patient taking differently: Take 50 mg by mouth daily. )  . torsemide (DEMADEX) 20 MG tablet Take 20 mg by mouth 2 (two) times daily as needed (leg swelling).   . vitamin B-12 (CYANOCOBALAMIN) 1000 MCG tablet Take 1,000 mcg by mouth daily.   . Vitamin D, Ergocalciferol, (DRISDOL) 50000 units CAPS capsule Take 50,000 Units by mouth every Thursday.    Allergies: Allergies as of 01/20/2019 - Review Complete 01/20/2019  Allergen Reaction Noted  . Rosiglitazone maleate Anaphylaxis and Swelling 05/21/2006  . Morphine Other (See Comments) 11/20/2009  . Cephalexin Diarrhea 06/14/2018  . Sulfa antibiotics Diarrhea and Nausea And Vomiting 09/24/2016  . Tramadol Other (See Comments) 07/14/2013  . Elavil [amitriptyline] Nausea Only 08/29/2014  . Tramadol-acetaminophen Rash 05/21/2006   Past Medical History:  Diagnosis Date  . Anxiety   . Arthritis    "hands" (12/29/2017)  . Basal cell carcinoma of nose    removed  . Bursitis of left shoulder   . Cat scratch fever    Late 90s  . Coronary artery disease   . Depression   . Diabetes mellitus, type II, insulin dependent ([place])   . GERD (gastroesophageal reflux disease)   . H. pylori infection 2008 and 1998   treated  . Hyperlipidemia   . Hypertension   . Hypothyroidism   . Multinodular goiter   . Rotator cuff tear, left recurrent   . Urge urinary incontinence   . UTI (lower urinary tract infection) 05/2016   Family History:  Family History  Problem Relation Age of Onset  . Cancer Mother        breast  . Heart Problems  Mother   . Prostate cancer Father   . Muscular dystrophy Son   . Cancer Maternal Grandmother        breast  . Heart disease Maternal Grandfather   . Breast cancer Sister    Social History: Lives in [place] with her husband and 21yo son, Eddie Dibbles, who has muscular dystrophy and is confined to a wheelchair. They do not have much outside help. Her husband is in good health aside from a knee problem. Additional family includes a son and a daughter-in-law who also live in [place].  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   Denies  the use of tobacco, EtOH, or other illicit substances.  Review of Systems: A complete ROS was negative except as per HPI.   Physical Exam: Blood pressure (!) 154/73, pulse 62, temperature 97.7 F (36.5 C), temperature source Oral, resp. rate (!) 25, height 5\' 4"  (1.626 m), weight 81.6 kg, SpO2 98 %. Vitals:   01/20/19 1900 01/20/19 1930 01/20/19 2000 01/20/19 2030  BP: (!) 145/60 (!) 161/58 130/84 (!) 154/73  Pulse: (!) 59 66 (!) 58 62  Resp: (!) 25 (!) 24 16 (!) 25  Temp:      TempSrc:      SpO2: 99% 99% 97% 98%  Weight:      Height:       General: Vital signs reviewed.  Patient is well-developed and well-nourished, in no acute distress and cooperative with exam.  Head: Normocephalic and atraumatic. Neck: Supple, difficult to assess JVD Cardiovascular: RRR, S1 normal, S2 normal, no murmurs, gallops, or rubs. Pulmonary/Chest: Normal wob on NRB, Decreased breath sounds midway up the posterior lung fields with bibasilar crackles.  Abdominal: Soft, non-tender, non-distended, BS + Musculoskeletal: No joint deformities, erythema, or stiffness, ROM full and nontender. Extremities: trace lower extremity edema bilaterally to the knees,  pulses symmetric and intact bilaterally. No cyanosis or clubbing. Neurological: A&O x3, Strength is normal and symmetric bilaterally, cranial nerve II-XII are grossly intact, no focal motor deficit, sensory intact to  light touch bilaterally.  Skin: Warm, dry and intact. No rashes or erythema. Psychiatric: Normal mood and affect. speech and behavior is normal. Cognition and memory are normal.    Labs: CBC: hgb 11.1, mcv 97, plts 106 CMP: Na 141, K 3.5, co2 21, creatine 1.11, normal LFTs BNP 895 Trop 0.01  EKG: personally reviewed my interpretation is sinus bradycardia HR 58, old LBBB similar to priors, LVH, no acute ischemic changes  CXR: personally reviewed my interpretation is cardiomegaly with pulmonary edema and L>R pleural effusions  Assessment & Plan by Problem: Active Problems:   Acute respiratory failure with hypoxia ([place])  77yoF with CAD, type 2 diabetes, HTN, severe gastroparesis, HFrEF presenting with acute on chronic heart failure exacerbation.   Acute on chronic HFrEF: Functional capacity is limited at baseline. Presenting with one week of worsening sob, orthopnea, LEE, and 3-4lb weight gain. Elevated BNP and CXR with evidence of pulmonary edema. Does not appear significantly volume overloaded on exam. Reports compliance with diuretics (daily spironolactone and prn torsemide) and denies dietary indiscretion. Etiology of heart failure could certainly be ischemic in origin given her known CAD. However, nonischemic etiologies in her case would be HTN and potentially worsening AR and MR.  - last echo in June 2019: EF 45-50%, LVH, g2dd, AR, MR - left hear cath June 2019: Patent ostial LAD stent, LCx mid 40% stenosis, stent placed in ramus intermedius, RCA with mild nonobstructive disease - continue home spironolactone 50mg  qd - cardiac monitoring - daily weights, strict I&Os - diuresis with IV lasix 60mg  - repeat echo  CAD:  - endorsing chest "tightness" which feels similar to her previous heart attack in June. Initially ekg and troponin are reassuring. Hopefully chest pain will improve with diuresis - trend troponin - repeat ekg if worsening chest pain  - continue home clopidogrel, baby  aspirin, imdur  Hypothyroidism: continue home levothyroxine - f/u TSH  DM2: moderate SSI tid wc and qhs - f/u a1c  HTN: continue home amlodipine 5mg  qd, labetalol 100mg  bid   HLD: continue home zetia and rosuvastatin  Gastroparesis: continue home  pantoprazole  Depression: continue home buspirone and fluoxetine   FEN/GI: CM HH diet, no IVFs Code: full, confirmed with patient DVT ppx: lovenox  Dispo: Admit patient to Inpatient with expected length of stay greater than 2 midnights.  Signed: Isabelle Course, MD 01/20/2019, 8:58 PM  Pager: [phone]

## 2019-01-20 NOTE — Telephone Encounter (Signed)
Pt called stated that she is having trouble breathing, pt been wheezing for about a week. Has an upcoming appt with pcp next week.

## 2019-01-20 NOTE — ED Triage Notes (Addendum)
Patient arrived from home via [facility] reporting shortness of breath X 1week-worse this morning, non-productive cough, , denies pain EMS reports using a simple mask to bring patient's oxygen up from 86 to 100%

## 2019-01-20 NOTE — ED Notes (Signed)
Applied simple mask because patient was 85 on room air, she is also a mouth breather

## 2019-01-20 NOTE — Telephone Encounter (Signed)
She can come in to see [place] if she wants

## 2019-01-20 NOTE — ED Provider Notes (Signed)
[facility] Emergency Department Provider Note MRN:  [id number]  Arrival date & time: 01/20/19     Chief Complaint   SOB  History of Present Illness   Sara Graves is a 78 y.o. year-old female with a history of CAD, diabetes, CHF presenting to the ED with chief complaint of shortness of breath.  Gradual and progressively worsening shortness of breath for the past week.  Feels like she cannot get enough air.  Feels like she is retaining fluid again.  Has been taking her diuretic medications as directed.  Endorsing some central chest tightness today, which is abnormal for her.  Denies fever, no cold-like symptoms, mild cough today which is dry.  Denies abdominal pain, no numbness weakness to the arms or legs.  Review of Systems  A complete 10 system review of systems was obtained and all systems are negative except as noted in the HPI and PMH.   Patient's Health History    Past Medical History:  Diagnosis Date  . Anxiety   . Arthritis    "hands" (12/29/2017)  . Basal cell carcinoma of nose    removed  . Bursitis of left shoulder   . Cat scratch fever    Late 90s  . Coronary artery disease   . Depression   . Diabetes mellitus, type II, insulin dependent ([place])   . GERD (gastroesophageal reflux disease)   . H. pylori infection 2008 and 1998   treated  . Hyperlipidemia   . Hypertension   . Hypothyroidism   . Multinodular goiter   . Rotator cuff tear, left recurrent   . Urge urinary incontinence   . UTI (lower urinary tract infection) 05/2016    Past Surgical History:  Procedure Laterality Date  . APPENDECTOMY  AGE 57  . BACK SURGERY    . BASAL CELL CARCINOMA EXCISION     "nose"  . BLADDER NECK SUSPENSION  1970's  . BUNIONECTOMY WITH HAMMERTOE RECONSTRUCTION Bilateral   . CARPAL TUNNEL RELEASE Right 05/2017  . COLONOSCOPY W/ POLYPECTOMY    . CORONARY ANGIOPLASTY WITH STENT PLACEMENT  12/29/2017  . CORONARY STENT INTERVENTION N/A 12/29/2017   Procedure: CORONARY STENT INTERVENTION;  Surgeon: Nigel Mormon, MD;  Location: [facility];  Service: Cardiovascular;  Laterality: N/A;  . CORONARY STENT INTERVENTION N/A 04/26/2018   Procedure: CORONARY STENT INTERVENTION;  Surgeon: Nigel Mormon, MD;  Location: [facility];  Service: Cardiovascular;  Laterality: N/A;  . ESOPHAGOGASTRODUODENOSCOPY ENDOSCOPY    . FORAMINAL DECOMPRESSION AT L2 TO THE SACRUM  01-05-2008   L2  -  S1  . GANGLION CYST EXCISION Left 01/17/2009   ring finger  . IMPLANTATION PERMANENT SPINAL CORD STIMULATOR  06-15-2008   JUNE 2013--  BATTERY CHANGE  . JOINT REPLACEMENT    . LEFT HEART CATH AND CORONARY ANGIOGRAPHY N/A 04/26/2018   Procedure: LEFT HEART CATH AND CORONARY ANGIOGRAPHY;  Surgeon: Nigel Mormon, MD;  Location: [facility];  Service: Cardiovascular;  Laterality: N/A;  . LIPOMA EXCISION Right    RIGHT ELBOW  . METATARSAL HEAD EXCISION Right 06/16/2018   Procedure: right 5th metatarsal excision, a mini c-arm;  Surgeon: Trula Slade, DPM;  Location: [facility];  Service: Podiatry;  Laterality: Right;  anesthesia can do block  . RIGHT/LEFT HEART CATH AND CORONARY ANGIOGRAPHY N/A 12/29/2017   Procedure: RIGHT/LEFT HEART CATH AND CORONARY ANGIOGRAPHY;  Surgeon: Nigel Mormon, MD;  Location: [facility];  Service: Cardiovascular;  Laterality: N/A;  . SHOULDER OPEN ROTATOR CUFF REPAIR  09/07/2012   Procedure: ROTATOR CUFF REPAIR SHOULDER OPEN;  Surgeon: Magnus Sinning, MD;  Location: [facility];  Service: Orthopedics;  Laterality: Left;  OPEN ANTERIOR ACROMIONECTOMY AND ROTATOR CUFF REPAIR ON LEFT   . SHOULDER OPEN ROTATOR CUFF REPAIR Left 12/28/2012   Procedure: ROTATOR CUFF REPAIR SHOULDER OPEN;  Surgeon: Magnus Sinning, MD;  Location: [facility];  Service: Orthopedics;  Laterality: Left;  OPEN SHOULDER ROTATOR CUFF REPAIR ON LEFT  WITH ANTERIOR ACROMINECTOMY   . SHOULDER  OPEN ROTATOR CUFF REPAIR Right 03/28/2003   RIGHT SHOULDER  DEGENERATIVE AC JOINT AND RC TEAR  . SPINAL CORD STIMULATOR BATTERY EXCHANGE N/A 07/03/2016   Procedure: SPINAL CORD STIMULATOR BATTERY PLACMENT;  Surgeon: Melina Schools, MD;  Location: [facility];  Service: Orthopedics;  Laterality: N/A;  . TONSILLECTOMY  AGE 33  . 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 VAGINAL HYSTERECTOMY  1970's    Family History  Problem Relation Age of Onset  . Cancer Mother        breast  . Heart Problems Mother   . Prostate cancer Father   . Muscular dystrophy Son   . Cancer Maternal Grandmother        breast  . Heart disease Maternal Grandfather   . Breast cancer Sister     Social History   Socioeconomic History  . Marital status: Married    Spouse name: Not on file  . Number of children: 2  . Years of education: 11th  . Highest education level: Not on file  Occupational History  . Occupation: Retired  Scientific laboratory technician  . Financial resource strain: Not on file  . Food insecurity:    Worry: Not on file    Inability: Not on file  . Transportation needs:    Medical: Not on file    Non-medical: Not on file  Tobacco Use  . Smoking status: Never Smoker  . Smokeless tobacco: Never Used  Substance and Sexual Activity  . Alcohol use: No  . Drug use: No  . Sexual activity: Yes  Lifestyle  . Physical activity:    Days per week: Not on file    Minutes per session: Not on file  . Stress: Not on file  Relationships  . Social connections:    Talks on phone: Not on file    Gets together: Not on file    Attends religious service: Not on file    Active member of club or organization: Not on file    Attends meetings of clubs or organizations: Not on file    Relationship status: Not on file  . Intimate partner violence:    Fear of current or ex partner: Not on file    Emotionally abused: Not on file    Physically abused: Not on file    Forced sexual activity: Not on file  Other  Topics Concern  . Not on file  Social History Narrative   Pt lives with her husband and 1 grown son in [place]. Younger son has muscular dystrophy. Having a difficult time as primary caregiver to son who is not doing well.  Declined respite care/placement.   Caffeine Use: none; very little     Physical Exam  Vital Signs and Nursing Notes reviewed Vitals:   01/20/19 2100 01/20/19 2142  BP: (!) 149/69 (!) 124/54  Pulse: (!) 52 62  Resp: 15 16  Temp:  98.5 F (36.9 C)  SpO2: 98% 99%    CONSTITUTIONAL: Well-appearing, NAD NEURO:  Alert and oriented x 3, no focal deficits EYES:  eyes equal and reactive ENT/NECK:  no LAD, no JVD CARDIO: Regular rate, well-perfused, normal S1 and S2 PULM:  CTAB no wheezing or rhonchi GI/GU:  normal bowel sounds, non-distended, non-tender MSK/SPINE:  No gross deformities, no edema SKIN:  no rash, atraumatic PSYCH:  Appropriate speech and behavior  Diagnostic and Interventional Summary    EKG Interpretation  Date/Time:  Thursday January 20 2019 15:43:58 EST Ventricular Rate:  58 PR Interval:    QRS Duration: 162 QT Interval:  503 QTC Calculation: 495 R Axis:   67 Text Interpretation:  Sinus rhythm Left bundle branch block Confirmed by Gerlene Fee [phone]) on 01/20/2019 4:37:35 PM      Labs Reviewed  CBC - Abnormal; Notable for the following components:      Result Value   RBC 3.50 (*)    Hemoglobin 11.1 (*)    HCT 34.2 (*)    Platelets 106 (*)    All other components within normal limits  BASIC METABOLIC PANEL - Abnormal; Notable for the following components:   CO2 21 (*)    Glucose, Bld 183 (*)    Creatinine, Ser 1.11 (*)    Calcium 8.8 (*)    GFR calc non Af Amer 48 (*)    GFR calc Af Amer 55 (*)    All other components within normal limits  BRAIN NATRIURETIC PEPTIDE - Abnormal; Notable for the following components:   B Natriuretic Peptide 895.9 (*)    All other components within normal limits  HEPATIC FUNCTION PANEL - Abnormal;  Notable for the following components:   Total Protein 6.2 (*)    All other components within normal limits  TSH  BASIC METABOLIC PANEL  CBC  TROPONIN I  TROPONIN I  TROPONIN I  HEMOGLOBIN A1C  I-STAT TROPONIN, ED    DG Chest 2 View  Final Result      Medications  aspirin EC tablet 81 mg (has no administration in time range)  amLODipine (NORVASC) tablet 5 mg (has no administration in time range)  ezetimibe (ZETIA) tablet 10 mg (has no administration in time range)  isosorbide mononitrate (IMDUR) 24 hr tablet 60 mg (has no administration in time range)  labetalol (NORMODYNE) tablet 100 mg (has no administration in time range)  rosuvastatin (CRESTOR) tablet 40 mg (has no administration in time range)  spironolactone (ALDACTONE) tablet 50 mg (has no administration in time range)  busPIRone (BUSPAR) tablet 7.5 mg (has no administration in time range)  FLUoxetine (PROZAC) capsule 20 mg (has no administration in time range)  levothyroxine (SYNTHROID, LEVOTHROID) tablet 50 mcg (has no administration in time range)  pantoprazole (PROTONIX) EC tablet 40 mg (has no administration in time range)  clopidogrel (PLAVIX) tablet 75 mg (has no administration in time range)  vitamin B-12 (CYANOCOBALAMIN) tablet 1,000 mcg (has no administration in time range)  enoxaparin (LOVENOX) injection 40 mg (has no administration in time range)  acetaminophen (TYLENOL) tablet 650 mg (has no administration in time range)    Or  acetaminophen (TYLENOL) suppository 650 mg (has no administration in time range)  ondansetron (ZOFRAN) tablet 4 mg (has no administration in time range)    Or  ondansetron (ZOFRAN) injection 4 mg (has no administration in time range)  polyethylene glycol (MIRALAX / GLYCOLAX) packet 17 g (has no administration in time range)  insulin aspart (  novoLOG) injection 0-15 Units (has no administration in time range)  insulin aspart (novoLOG) injection 0-5 Units (has no administration in time  range)  furosemide (LASIX) injection 60 mg (60 mg Intravenous Given 01/20/19 1902)     Procedures Critical Care  ED Course and Medical Decision Making  I have reviewed the triage vital signs and the nursing notes.  Pertinent labs & imaging results that were available during my care of the patient were reviewed by me and considered in my medical decision making (see below for details).  Suspect CHF exacerbation in this 78 year old female with history of the same, new oxygen requirement, currently 2 L via facemask.  Labs pending.  Clinical Course as of Jan 20 2147  Thu Jan 20, 2019  1740 Chest x-ray and labs consistent with suspected CHF exacerbation.  Given new oxygen requirement, will admit to internal medicine service.  Will provide IV dose of Lasix here in the ED.   [MB]    Clinical Course User Index [MB] Sedonia Small Barth Kirks, MD     Barth Kirks. Sedonia Small, [place] [email or web address]  Final Clinical Impressions(s) / ED Diagnoses     ICD-10-CM   1. Acute on chronic congestive heart failure, unspecified heart failure type ([place]) I50.9   2. SOB (shortness of breath) R06.02 DG Chest 2 View    DG Chest 2 View    ED Discharge Orders    None         Maudie Flakes, MD 01/20/19 2149

## 2019-01-20 NOTE — ED Notes (Signed)
Patient transported to X-ray 

## 2019-01-20 NOTE — Progress Notes (Signed)
Blood glucose is 124

## 2019-01-20 NOTE — ED Notes (Signed)
ED Provider at bedside. 

## 2019-01-20 NOTE — Telephone Encounter (Signed)
Pt went to ED

## 2019-01-21 ENCOUNTER — Inpatient Hospital Stay (HOSPITAL_COMMUNITY): Payer: Medicare Other

## 2019-01-21 DIAGNOSIS — E785 Hyperlipidemia, unspecified: Secondary | ICD-10-CM

## 2019-01-21 DIAGNOSIS — Z79899 Other long term (current) drug therapy: Secondary | ICD-10-CM

## 2019-01-21 DIAGNOSIS — I110 Hypertensive heart disease with heart failure: Principal | ICD-10-CM

## 2019-01-21 DIAGNOSIS — Z7902 Long term (current) use of antithrombotics/antiplatelets: Secondary | ICD-10-CM

## 2019-01-21 DIAGNOSIS — I447 Left bundle-branch block, unspecified: Secondary | ICD-10-CM

## 2019-01-21 DIAGNOSIS — I351 Nonrheumatic aortic (valve) insufficiency: Secondary | ICD-10-CM

## 2019-01-21 DIAGNOSIS — I340 Nonrheumatic mitral (valve) insufficiency: Secondary | ICD-10-CM

## 2019-01-21 DIAGNOSIS — I2510 Atherosclerotic heart disease of native coronary artery without angina pectoris: Secondary | ICD-10-CM

## 2019-01-21 DIAGNOSIS — Z881 Allergy status to other antibiotic agents status: Secondary | ICD-10-CM

## 2019-01-21 DIAGNOSIS — E1143 Type 2 diabetes mellitus with diabetic autonomic (poly)neuropathy: Secondary | ICD-10-CM

## 2019-01-21 DIAGNOSIS — Z79890 Hormone replacement therapy (postmenopausal): Secondary | ICD-10-CM

## 2019-01-21 DIAGNOSIS — I420 Dilated cardiomyopathy: Secondary | ICD-10-CM

## 2019-01-21 DIAGNOSIS — Z888 Allergy status to other drugs, medicaments and biological substances status: Secondary | ICD-10-CM

## 2019-01-21 DIAGNOSIS — F339 Major depressive disorder, recurrent, unspecified: Secondary | ICD-10-CM

## 2019-01-21 DIAGNOSIS — Z7982 Long term (current) use of aspirin: Secondary | ICD-10-CM

## 2019-01-21 DIAGNOSIS — Z885 Allergy status to narcotic agent status: Secondary | ICD-10-CM

## 2019-01-21 DIAGNOSIS — E039 Hypothyroidism, unspecified: Secondary | ICD-10-CM

## 2019-01-21 DIAGNOSIS — I5023 Acute on chronic systolic (congestive) heart failure: Secondary | ICD-10-CM

## 2019-01-21 DIAGNOSIS — K3184 Gastroparesis: Secondary | ICD-10-CM

## 2019-01-21 LAB — GLUCOSE, CAPILLARY
Glucose-Capillary: 124 mg/dL — ABNORMAL HIGH (ref 70–99)
Glucose-Capillary: 85 mg/dL (ref 70–99)
Glucose-Capillary: 116 mg/dL — ABNORMAL HIGH (ref 70–99)
Glucose-Capillary: 131 mg/dL — ABNORMAL HIGH (ref 70–99)
Glucose-Capillary: 138 mg/dL — ABNORMAL HIGH (ref 70–99)

## 2019-01-21 LAB — BASIC METABOLIC PANEL
Anion gap: 8 (ref 5–15)
BUN: 14 mg/dL (ref 8–23)
CHLORIDE: 110 mmol/L (ref 98–111)
CO2: 22 mmol/L (ref 22–32)
Calcium: 8.9 mg/dL (ref 8.9–10.3)
Creatinine, Ser: 1.17 mg/dL — ABNORMAL HIGH (ref 0.44–1.00)
GFR calc Af Amer: 52 mL/min — ABNORMAL LOW (ref >60)
GFR, EST NON AFRICAN AMERICAN: 45 mL/min — AB (ref >60)
Glucose, Bld: 106 mg/dL — ABNORMAL HIGH (ref 70–99)
POTASSIUM: 3.1 mmol/L — AB (ref 3.5–5.1)
Sodium: 140 mmol/L (ref 135–145)

## 2019-01-21 LAB — CBC
HCT: 34.3 % — ABNORMAL LOW (ref 36.0–46.0)
Hemoglobin: 11.1 g/dL — ABNORMAL LOW (ref 12.0–15.0)
MCH: 30.8 pg (ref 26.0–34.0)
MCHC: 32.4 g/dL (ref 30.0–36.0)
MCV: 95.3 fL (ref 80.0–100.0)
PLATELETS: 112 10*3/uL — AB (ref 150–400)
RBC: 3.6 MIL/uL — ABNORMAL LOW (ref 3.87–5.11)
RDW: 14.1 % (ref 11.5–15.5)
WBC: 4 10*3/uL (ref 4.0–10.5)
nRBC: 0 % (ref 0.0–0.2)

## 2019-01-21 LAB — ECHOCARDIOGRAM COMPLETE
Height: 64 in
Weight: 2795.2 oz

## 2019-01-21 LAB — TROPONIN I
Troponin I: <0.03 ng/mL (ref <0.03)
Troponin I: <0.03 ng/mL (ref <0.03)

## 2019-01-21 LAB — MAGNESIUM: Magnesium: 1.8 mg/dL (ref 1.7–2.4)

## 2019-01-21 MED ORDER — ADULT MULTIVITAMIN W/MINERALS CH
1.0000 | ORAL_TABLET | Freq: Every day | ORAL | Status: DC
  Administered 2019-01-22 – 2019-01-23 (×2): 1 via ORAL
  Filled 2019-01-21 (×3): qty 1

## 2019-01-21 MED ORDER — POTASSIUM CHLORIDE CRYS ER 20 MEQ PO TBCR
40.0000 meq | EXTENDED_RELEASE_TABLET | Freq: Two times a day (BID) | ORAL | Status: AC
  Administered 2019-01-21 (×2): 40 meq via ORAL
  Filled 2019-01-21 (×2): qty 2

## 2019-01-21 MED ORDER — FUROSEMIDE 10 MG/ML IJ SOLN: 80.0000 mg | Freq: Once | INTRAMUSCULAR | Status: DC

## 2019-01-21 MED ORDER — ENSURE ENLIVE PO LIQD
237.0000 mL | Freq: Three times a day (TID) | ORAL | Status: DC
  Administered 2019-01-21 – 2019-01-22 (×2): 237 mL via ORAL

## 2019-01-21 MED ORDER — FUROSEMIDE 10 MG/ML IJ SOLN
80.0000 mg | Freq: Once | INTRAMUSCULAR | Status: AC
  Administered 2019-01-21: 80 mg via INTRAVENOUS
  Filled 2019-01-21: qty 8

## 2019-01-21 MED ORDER — PERFLUTREN LIPID MICROSPHERE
1.0000 mL | INTRAVENOUS | Status: AC | PRN
  Administered 2019-01-21: 2 mL via INTRAVENOUS
  Filled 2019-01-21: qty 10

## 2019-01-21 NOTE — Progress Notes (Signed)
  Echocardiogram 2D Echocardiogram has been performed.  Sara Graves 01/21/2019, 9:12 AM

## 2019-01-21 NOTE — Progress Notes (Signed)
Inpatient Diabetes Program Recommendations  AACE/ADA: New Consensus Statement on Inpatient Glycemic Control   Target Ranges:  Prepandial:   less than 140 mg/dL      Peak postprandial:   less than 180 mg/dL (1-2 hours)      Critically ill patients:  140 - 180 mg/dL   Results for Sara Graves, Sara Graves (MRN [id number]) as of 01/21/2019 11:21  Ref. Range 01/20/2019 22:02 01/21/2019 06:00  Glucose-Capillary Latest Ref Range: 70 - 99 mg/dL 124 (H) 85   Results for Sara Graves, Sara Graves (MRN [id number]) as of 01/21/2019 11:21  Ref. Range 01/20/2019 21:48  Hemoglobin A1C Latest Ref Range: 4.8 - 5.6 % 7.8 (H)   Review of Glycemic Control  Diabetes history: DM2 Outpatient Diabetes medications: V-Go20 with Humalog (has not used insulin pump for past 1 week) Current orders for Inpatient glycemic control: Novolog 0-15 units TID with meals, Novolog 0-5 units QHS  Inpatient Diabetes Program Recommendations: HbgA1C:  A1C 7.8% on 01/20/19 indicating an average glucose of 177 mg/dl to evaluate glycemic control over the past 2-3 months.  NOTE: Spoke with patient regarding DM control. Patient states that she is followed by Dr. Hartford Poli (Endocrinologist) for DM control. Patient states that she has V-Go20 insulin pump with Humalog that she was using for DM control but she notes that she has not had the V-Go pump on for about 1 week. When patient uses her V-Go pump it provides a total of 20 units for basal insulin and she takes 3 clicks (total of 6  Units) with meals and 1 click (2 units) at bedtime.  Patient states that she recently seen Dr. Hartford Poli and she was told to keep V-Go pump off if she is not eating. Patient notes that she has not been able to eat much of anything for about 1 1/2 weeks due to no appetite.   Patient states that she has an appointment with GI doctor on 01/26/19 due to poor appetite. Discussed current insulin orders while inpatient. Informed patient that RD would also be consulted due to poor PO intake.  Patient  verbalized understanding of information discussed and notes that she has no questions at this time regarding DM.    Thanks, Barnie Alderman, RN, MSN, CDE Diabetes Coordinator Inpatient Diabetes Program [phone] (Team Pager from 8am to 5pm)

## 2019-01-21 NOTE — Progress Notes (Signed)
Initial Nutrition Assessment  DOCUMENTATION CODES:   Not applicable  INTERVENTION:   -Ensure Enlive po TID, each supplement provides 350 kcal and 20 grams of protein -MVI with minerals daily  NUTRITION DIAGNOSIS:   Inadequate oral intake related to social / environmental circumstances, decreased appetite as evidenced by per patient/family report, meal completion < 25%.  GOAL:   Patient will meet greater than or equal to 90% of their needs  MONITOR:   PO intake, Supplement acceptance, Labs, Weight trends, Skin, I & O's  REASON FOR ASSESSMENT:   Consult Poor PO  ASSESSMENT:   78yoF with CAD, type 2 diabetes, HTN, severe gastroparesis, HFrEF presenting with acute on chronic heart failure exacerbation.   Pt admitted with CHF.   Case discussed with RN prior to visit. Pt just received lunch trays and was provided Ensure.   Spoke with pt at bedside, who reports a decreased appetite over the past 2-3 weeks. Pt shares that she doesn't feel like eating. Her husband provides her meals 3 times per day, but will often only consume a few bites and sips of even her favorite foods. She consumed only a few bites of eggs for breakfast.Pt shares with this RD that she has been under extreme stress lately related to multiple hospitalizations, caring for a new puppy, and being the full-time care-giver of her 84 year old son whose health is declining due to ALS.   Pt reports her UBW is around 170-174#. She reports wt increase related to edema. Reviewed wt hx, which reveals a 4.5% wt loss over the past 3 months, which is not significant for time frame.    Discussed importance of good meal and supplement intake to promote healing. Pt familiar with Ensure supplements and is willing to try. Pt with poor oral intake and would benefit from nutrient dense supplement. One Ensure Enlive supplement provides 350 kcals, 20 grams protein, and 44-45 grams of carbohydrate vs one Glucerna shake supplement, which  provides 220 kcals, 10 grams of protein, and 26 grams of carbohydrate. Given pt's hx of DM, RD will continue to monitor PO intake, CBGS, and adjust supplement regimen as appropriate.   Lab Results  Component Value Date   HGBA1C 7.8 (H) 01/20/2019   PTA DM medications are V-Go20 (20 units daily of basal insulin and 6 units humalog each mea and 2 units humalog for snacks).  Labs reviewed: K: 3.1 (on PO supplementation), CBGS: 85-124 (inpatient orders for glycemic control are 0-15 units insulin aspart TID with meals and 0-5 units insulin aspart q HS).   NUTRITION - FOCUSED PHYSICAL EXAM:    Most Recent Value  Orbital Region  No depletion  Upper Arm Region  No depletion  Thoracic and Lumbar Region  No depletion  Buccal Region  No depletion  Temple Region  No depletion  Clavicle Bone Region  No depletion  Clavicle and Acromion Bone Region  No depletion  Scapular Bone Region  No depletion  Dorsal Hand  No depletion  Patellar Region  No depletion  Anterior Thigh Region  No depletion  Posterior Calf Region  No depletion  Edema (RD Assessment)  Mild  Hair  Reviewed  Eyes  Reviewed  Mouth  Reviewed  Skin  Reviewed  Nails  Reviewed       Diet Order:   Diet Order            Diet heart healthy/carb modified Room service appropriate? Yes; Fluid consistency: Thin; Fluid restriction: 1500 mL Fluid  Diet effective  now              EDUCATION NEEDS:   Education needs have been addressed  Skin:  Skin Assessment: Reviewed RN Assessment  Last BM:  01/20/19  Height:   Ht Readings from Last 1 Encounters:  01/20/19 5\' 4"  (1.626 m)    Weight:   Wt Readings from Last 1 Encounters:  01/21/19 79.2 kg    Ideal Body Weight:  54.5 kg  BMI:  Body mass index is 29.99 kg/m.  Estimated Nutritional Needs:   Kcal:  4163-8453  Protein:  90-105 grams  Fluid:  1.5 L    Modelle Vollmer A. Jimmye Norman, RD, LDN, [place] Registered Dietitian II Certified Diabetes Care and Education  Specialist Pager: [phone] After hours Pager: [phone]

## 2019-01-21 NOTE — Progress Notes (Addendum)
Subjective: Patient was seen and evaluated at bedside on morning rounds. Explains that she has been short of breath more than her baseline since a week ago, also reports orthopnea and feeling tired with mild activity.  She also had some discomfort in her chest few days ago that partially responded to nitroglycerin.  She did not have any further discomfort after that.  She denies any cough, fever or chills.   She noticed some lower extremity swelling mostly at the left leg, it was not associated with any pain or redness. No recent travel or surgery.  Objective:  Vital signs in last 24 hours: Vitals:   01/21/19 0731 01/21/19 0952 01/21/19 1000 01/21/19 1212  BP: (!) 152/49 (!) 146/53  (!) 127/44  Pulse: 61 62  (!) 52  Resp: 20 19  20   Temp: 97.8 F (36.6 C)   98.6 F (37 C)  TempSrc: Oral   Oral  SpO2: 95% (!) 89% 95% 94%  Weight:      Height:        Physical Exam   Constitutional: Pleasant lady, No acute distress but appears ill.  HENT:  Head: Normocephalic and atraumatic.  Cardiovascular: Bradycardic, regular rhythm and normal heart sounds. JVD is appreciated. Respiratory: No respiratory distress.  Has decreased breath sound, faint bibasilar crackle. GI: Soft. Bowel sounds are normal. No distension. There is no tenderness.  Musculoskeletal: No edema.  Homans sign is negative, muscles are palpable bilaterally. Neurological: Is alert and oriented x3 Skin: Looks dry Psychiatric: Normal mood and affect. Behavior is normal. Judgment and thought content normal.    Assessment/Plan:  Active Problems:   Acute respiratory failure with hypoxia ([place])  77yoF with CAD with Stent, HTN, type 2 diabetes with severe gastroparesis, HFrEF presenting with increased shortness of breath, orthopnea, fatigue. Trop on arrival was normal and EKG yesterday was without any new ST changes.  Likely acute on chronic heart failure exacerbation. Her JVP is elevated on exam and has decreased breath sound  as well as mild bibasilar crackle.  Echo today showed: EF 25 to 30%, restrictive filling, left ventrical global hypokinesis (Last echo 04/26/2018: EF 45 to 51%, grade 2 diastolic dysfunction. without regional wall motion abnormalities.) BNP elevated at 895.9 comparing to 282.6 3 months ago.    She reports compliance to her spironolactone, torsemide at home. How ever Lisinopril recently stopped because patient developed nausea with it per patient. (No note in chart) She generally has limited tolerance to activity, NYHA class ?Marland Kitchen Assessment and plan: Unclear etiology for recent worsening of heart failure. Ischemic cause, undetected arhythmia, vs due to stopping ARB.   She was 60 mg IV Lasix last night and notices some improvement in shortness of breath. We will diurese patient, continuing home meds, trending troponin. Will talk topatient's cardiologist ([name]), for further recommendation if no improvement in her shortness of breath.  -IV Lasix 60 mg once today. Will reevaluate volume status tomorrow  -Continue home spironolactone 50 mg daily -Continue home dose of Labetalol 100 mg BID -Weight daily -Restrict I/Os -BMP daily and replete potassium as needed -Cardiac monitoring -K. Dur 40 meq twice daily -May discuss risk and benefits of resuming ACE after discharge -Keep O2 sat >92%  CAD s/p stents: -Continue aspirin and Plavix and Imdur -Follow-up troponin trend -Continue cardiac monitoring  Hypothyroidism: TSH normal at 1.37, patient has been on levothyroxine 50 MCG daily. -Continue Synthroid 50 MCG daily  Insulin-dependent type 2 DM with severe gastroparesis: On Humalog 62 units units  3 times daily HbA1c yesterday was 7.6 -Continue moderate SSI -CBG monitoring  HTN: BP today 127/44 -Continue amlodipine 5 mg daily -Continue Labetalol 100 mg BID  FEN/GI: CM HH diet, no IVFs Code: full DVT ppx: lovenox Dispo: Anticipated discharge in approximately 4-5 days  Dewayne Hatch, MD 01/21/2019, 12:58 PM Pager: [phone]

## 2019-01-22 DIAGNOSIS — Z794 Long term (current) use of insulin: Secondary | ICD-10-CM

## 2019-01-22 DIAGNOSIS — Z955 Presence of coronary angioplasty implant and graft: Secondary | ICD-10-CM

## 2019-01-22 LAB — BASIC METABOLIC PANEL
Anion gap: 9 (ref 5–15)
BUN: 15 mg/dL (ref 8–23)
CALCIUM: 9 mg/dL (ref 8.9–10.3)
CO2: 27 mmol/L (ref 22–32)
Chloride: 105 mmol/L (ref 98–111)
Creatinine, Ser: 1.15 mg/dL — ABNORMAL HIGH (ref 0.44–1.00)
GFR calc Af Amer: 53 mL/min — ABNORMAL LOW (ref >60)
GFR, EST NON AFRICAN AMERICAN: 46 mL/min — AB (ref >60)
Glucose, Bld: 152 mg/dL — ABNORMAL HIGH (ref 70–99)
Potassium: 3.7 mmol/L (ref 3.5–5.1)
Sodium: 141 mmol/L (ref 135–145)

## 2019-01-22 LAB — GLUCOSE, CAPILLARY
Glucose-Capillary: 142 mg/dL — ABNORMAL HIGH (ref 70–99)
Glucose-Capillary: 223 mg/dL — ABNORMAL HIGH (ref 70–99)
Glucose-Capillary: 179 mg/dL — ABNORMAL HIGH (ref 70–99)
Glucose-Capillary: 211 mg/dL — ABNORMAL HIGH (ref 70–99)

## 2019-01-22 MED ORDER — TORSEMIDE 20 MG PO TABS
20.0000 mg | ORAL_TABLET | Freq: Two times a day (BID) | ORAL | Status: DC
  Administered 2019-01-22 – 2019-01-23 (×2): 20 mg via ORAL
  Filled 2019-01-22 (×2): qty 1

## 2019-01-22 NOTE — Progress Notes (Addendum)
  Progress note: 2:01 PM  Pulse Ox during ambulation perforemd by RN. O2 sat dropped to 84-86% when walking:  Patient's O2 sat at room Air at Rest =91% Patient's O2 sat on Room Air while Ambulating =84% Patient's O2 sat on 2 Liters of oxygen while Ambulating 96%  We will keep patient today to reevaluate tomorrow and decide about discharge and home O2 requirement.  I talked to Ms. Joo and updated her with the results and the plan. She agrees to stay in hospitals.  Elham Kerrigan [place], IM PGY-1

## 2019-01-22 NOTE — Progress Notes (Signed)
Subjective: Patient was seen and evaluated at bedside on morning rounds. No acute events overnight. Had some headache last night, it gets worse when she moves her neck. No nausea or vomiting with it. She feels better and sob improved. No other acute complaints.   Objective:  Vital signs in last 24 hours: Vitals:   01/21/19 1212 01/21/19 1954 01/21/19 2045 01/22/19 0521  BP: (!) 127/44 (!) 114/59 (!) 125/52 (!) 151/51  Pulse: (!) 52 (!) 59 (!) 56 62  Resp: 20 18  20   Temp: 98.6 F (37 C) 98.2 F (36.8 C)  98.4 F (36.9 C)  TempSrc: Oral Oral  Oral  SpO2: 94% 96%  97%  Weight:      Height:       Physical Exam HENT:     Head: Normocephalic and atraumatic.  Cardiovascular:     Rate and Rhythm: Normal rate and regular rhythm.     Heart sounds: No murmur.     Comments: JVP elevated at half of the neck but improved compairing to yesterday Pulmonary:     Effort: Pulmonary effort is normal.     Breath sounds: Normal breath sounds. No rales.  Abdominal:     Palpations: Abdomen is soft.     Tenderness: There is no abdominal tenderness.  Musculoskeletal:     Right lower leg: No edema.     Left lower leg: No edema.  Skin:    General: Skin is warm and dry.  Neurological:     General: No focal deficit present.     Mental Status: She is alert and oriented to person, place, and time.  Psychiatric:        Mood and Affect: Mood normal.        Behavior: Behavior normal.        Thought Content: Thought content normal.     Assessment/Plan:  Principal Problem:   Acute exacerbation of congestive heart failure (HCC) Active Problems:   DM (diabetes mellitus), type 2, uncontrolled ([place])   Coronary artery disease involving native coronary artery of native heart without angina pectoris   Acute respiratory failure with hypoxia ([place])  77yoF with CAD with Stent, HTN, type 2 diabetes with severe gastroparesis, HFrEF presenting with increased shortness of breath, orthopnea,  fatigue. Acute on chronic heart failure: With JVD and bibasilar cracjle on exam. BNP on arrival was elevated at 895.9 comparing to 282.6 3 months ago. CXR showed cardiomegaly and pulmonary interstitial edema and small pleural effusions.   Echo yesterday showed: EF 25 to 30%, restrictive filling, left ventrical global hypokinesis. Her heart failure worsened comparing to finding from previous echo at 04/26/2018 with EF 45 to 09%, grade 2 diastolic dysfunction. Trop negative but has LBBB on EKG (persistant in previous EKG)  Received 60 mg IV lasix on first day and 80 mg IV Lasix yesterday.  Wt today 169.8 lb, (Lost 4 lb since yesterday and 10 lb since arrival on ED, 2 days ago) I/O: net 520 ml yesterday. Renal function and potassium are stable.  Her symptoms improved. Still has JVD but lower compairing to yesterday. Lunge exam is ok and no LEE.  Will try to wean off of nasal Oxygen and evaluate her tolerance to activity. May switch to PO Lasix today for probable discharge late today or tomorrow based on her PT tolerance.  -PT eval and treat. Walk in the hall -May DC today if tolerates activity -Will switch to PO Torsemide 20 mg BID at discharge (used to take  Torsemide on as needed) -Will follow up with PCP in a week for BMP check -Will continue rest of home medications as before after discharge -Advised to get an earlier appointment with [name] for probable ischemic work up considering worsening of CHF, LBBB on EKG. Also for considering ACE inh/ARB if side effects allows  -While in hospital: -Continue home spironolactone 50 mg daily -Continue home dose of Labetalol 100 mg BID -Weight daily -Restrict I/Os -BMP daily and replete potassium as needed -Cardiac monitoring -K. Dur 40 meq twice daily -Keep O2 sat >92%  CAD s/p stents: -Continue aspirin and Plavix and Imdur  Hypothyroidism: TSH normal at 1.37, patient has been on levothyroxine 50 MCG daily. 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

## 2019-01-22 NOTE — Discharge Summary (Signed)
Name: Sara Graves MRN: [id number] DOB: Dec 13, 1940 78 y.o. PCP: Selinda Orion  Date of Admission: 01/20/2019  3:31 PM Date of Discharge: 01/23/2019 Attending Physician: Dr. Lalla Brothers  Discharge Diagnosis: 1. Principal Problem:   Acute exacerbation of congestive heart failure (HCC) Active Problems:   DM (diabetes mellitus), type 2, uncontrolled ([place])   Coronary artery disease involving native coronary artery of native heart without angina pectoris   Acute respiratory failure with hypoxia [facility])   Discharge Medications: Allergies as of 01/23/2019      Reactions   Rosiglitazone Maleate Anaphylaxis, Swelling   Morphine Other (See Comments)   SEVERE HYPOTENSION   Cephalexin Diarrhea   Sulfa Antibiotics Diarrhea, Nausea And Vomiting   Tramadol Other (See Comments)   Unknown reaction   Elavil [amitriptyline] Nausea Only   Tramadol-acetaminophen Rash      Medication List    STOP taking these medications   mupirocin ointment 2 % Commonly known as:  BACTROBAN   prasugrel 10 MG Tabs tablet Commonly known as:  EFFIENT     TAKE these medications   acetaminophen 500 MG tablet Commonly known as:  TYLENOL Take 500-1,000 mg by mouth every 6 (six) hours as needed for headache (pain).   amLODipine 5 MG tablet Commonly known as:  NORVASC Take 1 tablet (5 mg total) by mouth daily.   aspirin EC 81 MG tablet Take 81 mg by mouth daily.   busPIRone 7.5 MG tablet Commonly known as:  BUSPAR Take 7.5 mg by mouth 2 (two) times daily.   CALCIUM 600 + D PO Take 600 mg by mouth daily.   clopidogrel 75 MG tablet Commonly known as:  PLAVIX Take 75 mg by mouth daily.   ezetimibe 10 MG tablet Commonly known as:  ZETIA Take 10 mg by mouth daily.   FLUoxetine 20 MG capsule Commonly known as:  PROZAC Take 20 mg by mouth daily.   insulin lispro 100 UNIT/ML injection Commonly known as:  HUMALOG Inject 2-6 Units into the skin See admin instructions. For use with V - GO 20  Insulin Delivery Device: inject 6 units subcutaneously three times daily before meals, inject 2 units at bedtime if eating a bedtime snack   Investigational - Study Medication Take 1 tablet by mouth daily. Study name: dapagliflozin/metformin ER 10/1000mg  Additional study details: This is a Drug Study medication from Dr. Nadyne Coombes at [facility]. Patient started taking this medication on 04/22/18 and is unsure of how long she is to take this medication for. Per patient, she is on this medication as part of a Heart and Diabetes drug study.   isosorbide mononitrate 60 MG 24 hr tablet Commonly known as:  IMDUR Take 60 mg by mouth daily.   labetalol 100 MG tablet Commonly known as:  NORMODYNE Take 100 mg by mouth 2 (two) times daily.   levothyroxine 50 MCG tablet Commonly known as:  SYNTHROID, LEVOTHROID Take 50 mcg by mouth daily before breakfast.   nitroGLYCERIN 0.4 MG SL tablet Commonly known as:  NITROSTAT Place 1 tablet (0.4 mg total) under the tongue every 5 (five) minutes x 3 doses as needed for chest pain.   OneTouch Verio test strip Generic drug:  glucose blood USE 1 STRIP 3 (THREE) TIMES A DAY.   OneTouch Verio test strip Generic drug:  glucose blood USE 1 STRIP 3 (THREE) TIMES A DAY.   pantoprazole 40 MG tablet Commonly known as:  PROTONIX Take 1 tablet (40 mg total) by mouth 2 (  two) times daily. What changed:    when to take this  reasons to take this   potassium chloride SA 20 MEQ tablet Commonly known as:  Klor-Con M20 Take 1 tablet (20 mEq total) by mouth 2 (two) times daily. With Torsemide as needed for leg swelling What changed:    when to take this  reasons to take this  additional instructions   rosuvastatin 40 MG tablet Commonly known as:  CRESTOR Take 1 tablet (40 mg total) by mouth at bedtime.   spironolactone 50 MG tablet Commonly known as:  ALDACTONE TAKE 1 TABLET BY MOUTH DAILY. MAY USE 2 TABLETS OF 25MG  DAILY FOR NOW What changed:  See  the new instructions.   torsemide 20 MG tablet Commonly known as:  DEMADEX Take 1 tablet (20 mg total) by mouth 2 (two) times daily. What changed:    when to take this  reasons to take this   vitamin B-12 1000 MCG tablet Commonly known as:  CYANOCOBALAMIN Take 1,000 mcg by mouth daily.   Vitamin D (Ergocalciferol) 1.25 MG (50000 UT) Caps capsule Commonly known as:  DRISDOL Take 50,000 Units by mouth every Thursday.       Disposition and follow-up:   [name] was discharged from [facility] in Stable condition.  At the hospital follow up visit please address:  1.  Acute on Chronic HFrEF: Patient presented with shortness of breath and dyspnea on exertion.  Likely due to acute on chronic heart failure, with worsening EF 25-30% on echo.  Responded well to diuresis.  Dry weight on discharge was 168.4 lbs.  She was discharged on torsemide 20 mg BID, home potassium supplements were continued.  Please reevaluate volume status and check BMP at follow-up visit and decide about Potassium supplement.     2.  Labs / imaging needed at time of follow-up: BMP  3.  Pending labs/ test needing follow-up: None  Follow-up Appointments: Follow-up Information    [name], PA-C. Go in 1 week(s).   Specialty:  Physician Assistant Contact information: [place] [address] [phone]        Adrian Prows, MD. Schedule an appointment as soon as possible for a visit in 1 week(s).   Specialty:  Cardiology Contact information: [address] [place] [address] [place] by problem list:  1. Acute on Chronic HFrEF:  78yo F with PMHx of ischemic cardiomyopathy, HTN, type 2 diabeteswand severe gastroparesis presented withincreased shortness of breath, orthopnea, fatigue and decreased tolerance to activity. On arrival, she was moderately volume overloaded with JVD up to her mandible and  bibasilar crackle. Found to have a new hypoxia, vascular congestion on CXR. ACS was ruled out with troponins and EKG. She was diuresed with IV lasix and responded well. Repeat echo peformed showed: EF 25 to 30%,restrictive filling, left ventrical global hypokinesis. (worsened comparing to finding from previous echo at 04/26/2018 with EF 45 to 41%, grade 2 diastolic dysfunction.). She was transitioned to torsemide 20 mg BID prior to discharge, unfortunately had a persistent oxygen requirement. She was discharged on 2L [place]. She was continued on her outpatient heart failure regimen. Instructed to follow up with her cardiologist.    Discharge Vitals:   BP (!) 113/50 (BP Location: Left Arm)   Pulse (!) 51   Temp 97.6 F (36.4 C) (Oral)   Resp 20   Ht 5'  4" (1.626 m)   Wt 76.4 kg   SpO2 93%   BMI 28.91 kg/m   Pertinent Labs, Studies, and Procedures:  BMP Latest Ref Rng & Units 01/23/2019 01/22/2019 01/21/2019  Glucose 70 - 99 mg/dL 116(H) 152(H) 106(H)  BUN 8 - 23 mg/dL 18 15 14   Creatinine 0.44 - 1.00 mg/dL 1.17(H) 1.15(H) 1.17(H)  BUN/Creat Ratio 12 - 28 - - -  Sodium 135 - 145 mmol/L 139 141 140  Potassium 3.5 - 5.1 mmol/L 3.6 3.7 3.1(L)  Chloride 98 - 111 mmol/L 102 105 110  CO2 22 - 32 mmol/L 27 27 22   Calcium 8.9 - 10.3 mg/dL 9.0 9.0 8.9  TSH: 1.37 Serial troponin: <0.03 (repeated x3) Echo  01/21/2019 showed: EF 25 to 30%,restrictive filling, left ventrical global hypokinesis.  Discharge Instructions: Discharge Instructions    (HEART FAILURE PATIENTS) Call MD:  Anytime you have any of the following symptoms: 1) 3 pound weight gain in 24 hours or 5 pounds in 1 week 2) shortness of breath, with or without a dry hacking cough 3) swelling in the hands, feet or stomach 4) if you have to sleep on extra pillows at night in order to breathe.   Complete by:  As directed    Diet - low sodium heart healthy   Complete by:  As directed    Discharge instructions   Complete by:  As directed    Ms.  Leduc,  Thank you for allowing [place] taking care of you at [place] came in due to worsening of shortness of breath.  We found you to have extra fluid in your body and your lungs due to heart failure.  We are glad that you feel better now after the treatment. I discharge you with a change in your torsemide: Please take torsemide 20 mg twice a day (every day instead of as needed). You can continue rest of your medications as before.   As we talked, please make sure to call your cardiologist, Dr. Irven Shelling office to get an early appointment within 2 weeks and follow up with him. Also follow-up with your primary care next week.  Thank you!  Dr. Philipp Ovens   Increase activity slowly   Complete by:  As directed      Signed: Velna Ochs, MD 01/26/2019, 3:02 PM   Pager: [phone]

## 2019-01-22 NOTE — Discharge Instructions (Signed)
You for allowing [place] taking care of you at [facility] came in due to worsening of shortness of breath.  We found you to have extra fluid in your body and your lungs due to heart failure.  We are glad that you feel better now after the treatment. I discharge you with a change in your torsemide: Please take torsemide 20 mg twice a day (every day instead of as needed). You can continue rest of your medications as before. (You can take potassium pill 2-3 times per week, until you see your PCP and she can decide if you need to take Potassium every day with Torsemide.)  As we talked, please make sure to call your cardiologist, Dr. Irven Shelling office to get an early appointment within 2 weeks and follow up with him. Also follow-up with your primary care next week.    You can call us at [phone] if you have any question or concern. Thank you

## 2019-01-22 NOTE — Progress Notes (Signed)
SATURATION QUALIFICATIONS: (This note is used to comply with regulatory documentation for home oxygen)  Patient Saturations on Room Air at Rest =91%  Patient Saturations on Room Air while Ambulating =84%  Patient Saturations on2 Liters of oxygen while Ambulating 96%  Please briefly explain why patient needs home oxygen:

## 2019-01-23 ENCOUNTER — Encounter (HOSPITAL_COMMUNITY): Payer: Self-pay | Admitting: Cardiology

## 2019-01-23 LAB — BASIC METABOLIC PANEL
Anion gap: 10 (ref 5–15)
BUN: 18 mg/dL (ref 8–23)
CO2: 27 mmol/L (ref 22–32)
CREATININE: 1.17 mg/dL — AB (ref 0.44–1.00)
Calcium: 9 mg/dL (ref 8.9–10.3)
Chloride: 102 mmol/L (ref 98–111)
GFR calc non Af Amer: 45 mL/min — ABNORMAL LOW (ref >60)
GFR, EST AFRICAN AMERICAN: 52 mL/min — AB (ref >60)
Glucose, Bld: 116 mg/dL — ABNORMAL HIGH (ref 70–99)
Potassium: 3.6 mmol/L (ref 3.5–5.1)
Sodium: 139 mmol/L (ref 135–145)

## 2019-01-23 LAB — GLUCOSE, CAPILLARY
Glucose-Capillary: 113 mg/dL — ABNORMAL HIGH (ref 70–99)
Glucose-Capillary: 168 mg/dL — ABNORMAL HIGH (ref 70–99)

## 2019-01-23 MED ORDER — TORSEMIDE 20 MG PO TABS: 20.0000 mg | ORAL_TABLET | Freq: Two times a day (BID) | ORAL | 3 refills | Status: DC

## 2019-01-23 NOTE — Progress Notes (Signed)
Pt has orders to be discharged. Discharge instructions given and pt has no additional questions at this time. Medication regimen reviewed and pt educated. Pt verbalized understanding and has no additional questions. Telemetry box removed. IV removed and site in good condition. Home oxygen has been delivered. Pt stable and waiting for transportation.

## 2019-01-23 NOTE — Progress Notes (Signed)
   Subjective: Sara Graves states she is still feeling short of breath this morning but improving. She states her oxygen saturation dropped to the 80s on ambulation yesterday.   Objective:  Vital signs in last 24 hours: Vitals:   01/22/19 0954 01/22/19 1305 01/22/19 2021 01/23/19 0542  BP: 140/63 (!) 133/47 (!) 122/39 (!) 138/40  Pulse: 63 (!) 50 65 65  Resp:  18 20 16   Temp:  97.9 F (36.6 C) 98.6 F (37 C) 97.8 F (36.6 C)  TempSrc:  Oral Oral   SpO2:  97% 100% 95%  Weight:    76.4 kg  Height:       General: seen comfortably laying in bed, no distress Pulm: CTAB, very faint bibasilar crackles, no wheezing  CV: RRR, no m/r/g Ab: Soft, non tender, non distended. +BS Extremities: No edema, warm    Assessment/Plan:  Principal Problem:   Acute exacerbation of congestive heart failure (HCC) Active Problems:   DM (diabetes mellitus), type 2, uncontrolled ([place])   Coronary artery disease involving native coronary artery of native heart without angina pectoris   Acute respiratory failure with hypoxia (HCC)  Acute on Chronic HFrEF CAD s/p PCI Echo this admission with worsening EF. Presented with pulmonary edema and new oxygen requirement. Diuresed well with IV lasix and weaned to room air. Unfortunately desaturated with ambulation. Transitioned to PO torsemide yesterday. Net negative 1.5L.  -- Continue torsemide 20 mg BID -- Ambulate again today with pulse ox  -- May need home oxygen -- Clinically improved, possible discharge today  -- Continue home spironolactone, labetalol, and imdur  -- Continue ASA and plavix    FEN: Fluid restrict, replete lytes prn, HH / carb mod VTE ppx: Lovenox  Code Status: FULL     Dispo: Anticipated discharge 0-1 days.   Velna Ochs, M.D. - PGY3 Pager: [phone] 01/23/2019, 9:02 AM

## 2019-01-23 NOTE — Progress Notes (Signed)
SATURATION QUALIFICATIONS: (This note is used to comply with regulatory documentation for home oxygen)  Patient Saturations on Room Air at Rest = 87%   Patient Saturations on 2-3 Liters of oxygen while Ambulating = 91%  Please briefly explain why patient needs home oxygen: Even with 2L of oxygen, patient's oxygen saturations would drop to 85% after 66ft of ambulating. Frequent rest periods taken. Had to increase to 3L during these times.

## 2019-01-23 NOTE — Care Management Note (Signed)
Case Management Note  Patient Details  Name: Sara Graves MRN: [id number] Date of Birth: Mar 19, 1941  Subjective/Objective:                    Action/Plan:  Spoke w patient at bedside. Discussed DC plan. She is returning home with new home oxygen set up. She would like to use [facility]) for home O2, referral made. Tank for transport home will be delivered to room prior to DC. Patient states she has transportation home.   Expected Discharge Date:  01/22/19               Expected Discharge Plan:  Home/Self Care  In-House Referral:     Discharge planning Services  CM Consult  Post Acute Care Choice:  Durable Medical Equipment Choice offered to:     DME Arranged:  Oxygen DME Agency:  [facility]  HH Arranged:    HH Agency:     Status of Service:  Completed, signed off  If discussed at [place] of Stay Meetings, dates discussed:    Additional Comments:  Carles Collet, RN 01/23/2019, 1:32 PM

## 2019-01-26 ENCOUNTER — Telehealth: Payer: Self-pay | Admitting: Internal Medicine

## 2019-01-26 ENCOUNTER — Other Ambulatory Visit: Payer: Self-pay | Admitting: Internal Medicine

## 2019-01-26 NOTE — Telephone Encounter (Signed)
Spoke with Ms. Yusko regarding her request about getting portable supplemental O2 supply, since what she was provided at hospital is heavy. I talked to care manager, she mentions that since patient is discharged, care management office can not help with that but PCP or outpatient office can request [facility] to assess patient at home. I contacted [facility], they need an order for O2 conserving device eval. Apparently, it is going to be done from out patient (PCP) office instead of inpatient team since the patient has been discharged. I tried multiple times to reach Ms. Lapidus again, to provide her this information and to ask her PCP to follow the case, unfortunately I was not able to reach her.

## 2019-01-27 ENCOUNTER — Other Ambulatory Visit: Payer: Self-pay

## 2019-01-27 ENCOUNTER — Ambulatory Visit: Payer: Medicare Other | Admitting: Cardiology

## 2019-01-27 ENCOUNTER — Encounter: Payer: Self-pay | Admitting: Cardiology

## 2019-01-27 VITALS — BP 137/49 | HR 52 | Ht 63.5 in | Wt 169.0 lb

## 2019-01-27 DIAGNOSIS — I10 Essential (primary) hypertension: Secondary | ICD-10-CM

## 2019-01-27 DIAGNOSIS — E1122 Type 2 diabetes mellitus with diabetic chronic kidney disease: Secondary | ICD-10-CM

## 2019-01-27 DIAGNOSIS — I2510 Atherosclerotic heart disease of native coronary artery without angina pectoris: Secondary | ICD-10-CM | POA: Diagnosis not present

## 2019-01-27 DIAGNOSIS — N183 Chronic kidney disease, stage 3 (moderate): Secondary | ICD-10-CM

## 2019-01-27 DIAGNOSIS — Z9981 Dependence on supplemental oxygen: Secondary | ICD-10-CM

## 2019-01-27 DIAGNOSIS — I129 Hypertensive chronic kidney disease with stage 1 through stage 4 chronic kidney disease, or unspecified chronic kidney disease: Secondary | ICD-10-CM | POA: Diagnosis not present

## 2019-01-27 DIAGNOSIS — I5043 Acute on chronic combined systolic (congestive) and diastolic (congestive) heart failure: Secondary | ICD-10-CM | POA: Diagnosis not present

## 2019-01-27 NOTE — H&P (View-Only) (Signed)
Subjective:  Primary Physician:  Sara Dials, PA-C  [name], female    DOB: 1940-12-03, 78 y.o.   MRN: [id number]  Chief Complaint  Patient presents with  . Hypertension    Hosp F/U      HPI: Sara Graves  is a 78 y.o. female  with hypertension, uncontrolled type 2 DM, CAD s/p prior LAD and ramus PCI, HFpEF, h/o Rt leg cellulitis and metatarsal fracture, managed by Podiatry.  Here today for hospital follow up on 01/23/2019 for CHF exacerbation and hypoxic acute respiratory failure. She reports a few days prior to going to the hospital, she developed significant shortness of breath, orthopnea, and leg swelling. She was aggressively diuresed with changing to Torsemide. She is now on continuous oxygen at 2 L.  Since being home, symptoms have improved. States that she is feeling better. Continues to have leg swelling and shortness of breath on exertion. Continues to have decreased appetite and reports that she is being very careful with her diet. States that she has been taking potassium supplement twice a day as she started having leg cramps.   She was started on Lisinopril at her last office visit as she did not tolerate due to abdominal pain. She is tolerating losartan well.     Past Medical History:  Diagnosis Date  . Anxiety   . Arthritis    "hands" (12/29/2017)  . Basal cell carcinoma of nose    removed  . Bursitis of left shoulder   . Cat scratch fever    Late 90s  . Coronary artery disease   . Depression   . Diabetes mellitus, type II, insulin dependent ([place])   . GERD (gastroesophageal reflux disease)   . H. pylori infection 2008 and 1998   treated  . Hyperlipidemia   . Hypertension   . Hypothyroidism   . Low oxygen saturation   . Multinodular goiter   . Rotator cuff tear, left recurrent   . Urge urinary incontinence   . UTI (lower urinary tract infection) 05/2016    Past Surgical History:  Procedure Laterality Date  .  APPENDECTOMY  AGE 30  . BACK SURGERY    . BASAL CELL CARCINOMA EXCISION     "nose"  . BLADDER NECK SUSPENSION  1970's  . BUNIONECTOMY WITH HAMMERTOE RECONSTRUCTION Bilateral   . CARPAL TUNNEL RELEASE Right 05/2017  . COLONOSCOPY W/ POLYPECTOMY    . CORONARY ANGIOPLASTY WITH STENT PLACEMENT  12/29/2017  . CORONARY STENT INTERVENTION N/A 12/29/2017   Procedure: CORONARY STENT INTERVENTION;  Surgeon: Nigel Mormon, MD;  Location: [facility];  Service: Cardiovascular;  Laterality: N/A;  . CORONARY STENT INTERVENTION N/A 04/26/2018   Procedure: CORONARY STENT INTERVENTION;  Surgeon: Nigel Mormon, MD;  Location: [facility];  Service: Cardiovascular;  Laterality: N/A;  . ESOPHAGOGASTRODUODENOSCOPY ENDOSCOPY    . FORAMINAL DECOMPRESSION AT L2 TO THE SACRUM  01-05-2008   L2  -  S1  . GANGLION CYST EXCISION Left 01/17/2009   ring finger  . IMPLANTATION PERMANENT SPINAL CORD STIMULATOR  06-15-2008   JUNE 2013--  BATTERY CHANGE  . JOINT REPLACEMENT    . LEFT HEART CATH AND CORONARY ANGIOGRAPHY N/A 04/26/2018   Procedure: LEFT HEART CATH AND CORONARY ANGIOGRAPHY;  Surgeon: Nigel Mormon, MD;  Location: [facility];  Service: Cardiovascular;  Laterality: N/A;  . LIPOMA EXCISION Right    RIGHT ELBOW  . METATARSAL HEAD EXCISION Right 06/16/2018  Procedure: right 5th metatarsal excision, a mini c-arm;  Surgeon: Trula Slade, DPM;  Location: [facility];  Service: Podiatry;  Laterality: Right;  anesthesia can do block  . RIGHT/LEFT HEART CATH AND CORONARY ANGIOGRAPHY N/A 12/29/2017   Procedure: RIGHT/LEFT HEART CATH AND CORONARY ANGIOGRAPHY;  Surgeon: Nigel Mormon, MD;  Location: [facility];  Service: Cardiovascular;  Laterality: N/A;  . SHOULDER OPEN ROTATOR CUFF REPAIR  09/07/2012   Procedure: ROTATOR CUFF REPAIR SHOULDER OPEN;  Surgeon: Magnus Sinning, MD;  Location: [facility];  Service: Orthopedics;  Laterality: Left;  OPEN  ANTERIOR ACROMIONECTOMY AND ROTATOR CUFF REPAIR ON LEFT   . SHOULDER OPEN ROTATOR CUFF REPAIR Left 12/28/2012   Procedure: ROTATOR CUFF REPAIR SHOULDER OPEN;  Surgeon: Magnus Sinning, MD;  Location: [facility];  Service: Orthopedics;  Laterality: Left;  OPEN SHOULDER ROTATOR CUFF REPAIR ON LEFT  WITH ANTERIOR ACROMINECTOMY   . SHOULDER OPEN ROTATOR CUFF REPAIR Right 03/28/2003   RIGHT SHOULDER  DEGENERATIVE AC JOINT AND RC TEAR  . SPINAL CORD STIMULATOR BATTERY EXCHANGE N/A 07/03/2016   Procedure: SPINAL CORD STIMULATOR BATTERY PLACMENT;  Surgeon: Melina Schools, MD;  Location: [facility] [facility];  Service: Orthopedics;  Laterality: N/A;  . TONSILLECTOMY  AGE 42  . 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 VAGINAL HYSTERECTOMY  1970's    Social History   Socioeconomic History  . Marital status: Married    Spouse name: Not on file  . Number of children: 2  . Years of education: 11th  . Highest education level: Not on file  Occupational History  . Occupation: Retired  Scientific laboratory technician  . Financial resource strain: Not on file  . Food insecurity:    Worry: Not on file    Inability: Not on file  . Transportation needs:    Medical: Not on file    Non-medical: Not on file  Tobacco Use  . Smoking status: Never Smoker  . Smokeless tobacco: Never Used  Substance and Sexual Activity  . Alcohol use: No  . Drug use: No  . Sexual activity: Yes  Lifestyle  . Physical activity:    Days per week: Not on file    Minutes per session: Not on file  . Stress: Not on file  Relationships  . Social connections:    Talks on phone: Not on file    Gets together: Not on file    Attends religious service: Not on file    Active member of club or organization: Not on file    Attends meetings of clubs or organizations: Not on file    Relationship status: Not on file  . Intimate partner violence:    Fear of current or ex partner: Not on file    Emotionally abused: Not on file     Physically abused: Not on file    Forced sexual activity: Not on file  Other Topics Concern  . Not on file  Social History Narrative   Pt lives with her husband and 1 grown son in [place]. Younger son has muscular dystrophy. Having a difficult time as primary caregiver to son who is not doing well.  Declined respite care/placement.   Caffeine Use: none; very little    Current Outpatient Medications on File Prior to Visit  Medication Sig Dispense Refill  . acetaminophen (TYLENOL) 500 MG tablet Take 500-1,000 mg by mouth every 6 (six) hours as needed for headache (pain).    Marland Kitchen  amLODipine (NORVASC) 5 MG tablet Take 1 tablet (5 mg total) by mouth daily. 60 tablet 1  . aspirin EC 81 MG tablet Take 81 mg by mouth daily.    . busPIRone (BUSPAR) 7.5 MG tablet Take 7.5 mg by mouth 2 (two) times daily.    . Calcium Carb-Cholecalciferol (CALCIUM 600 + D PO) Take 600 mg by mouth daily.     . clopidogrel (PLAVIX) 75 MG tablet Take 75 mg by mouth daily.     Marland Kitchen ezetimibe (ZETIA) 10 MG tablet Take 10 mg by mouth daily.    Marland Kitchen FLUoxetine (PROZAC) 20 MG capsule Take 20 mg by mouth daily.   1  . glucose blood (ONETOUCH VERIO) test strip USE 1 STRIP 3 (THREE) TIMES A DAY.    Marland Kitchen insulin lispro (HUMALOG) 100 UNIT/ML injection Inject 2-6 Units into the skin See admin instructions. For use with V - GO 20 Insulin Delivery Device: inject 6 units subcutaneously three times daily before meals, inject 2 units at bedtime if eating a bedtime snack    . Investigational - Study Medication Take 1 tablet by mouth daily. Study name: dapagliflozin/metformin ER 10/1000mg Additional study details: This is a Drug Study medication from Dr. Nadyne Coombes at [facility]. Patient started taking this medication on 04/22/18 and is unsure of how long she is to take this medication for. Per patient, she is on this medication as part of a Heart and Diabetes drug study. 1 each PRN  . isosorbide mononitrate (IMDUR) 60 MG 24 hr tablet Take 60 mg by  mouth daily.    Marland Kitchen labetalol (NORMODYNE) 100 MG tablet Take 100 mg by mouth 2 (two) times daily.    Marland Kitchen levothyroxine (SYNTHROID, LEVOTHROID) 50 MCG tablet Take 50 mcg by mouth daily before breakfast.     . nitroGLYCERIN (NITROSTAT) 0.4 MG SL tablet Place 1 tablet (0.4 mg total) under the tongue every 5 (five) minutes x 3 doses as needed for chest pain. 30 tablet 1  . ONETOUCH VERIO test strip USE 1 STRIP 3 (THREE) TIMES A DAY.  5  . pantoprazole (PROTONIX) 40 MG tablet Take 1 tablet (40 mg total) by mouth 2 (two) times daily. (Patient taking differently: Take 40 mg by mouth daily as needed (acid reflux). ) 60 tablet 1  . potassium chloride SA (KLOR-CON M20) 20 MEQ tablet Take 1 tablet (20 mEq total) by mouth 2 (two) times daily. With Torsemide as needed for leg swelling (Patient taking differently: Take 20 mEq by mouth 2 (two) times daily as needed (with each dose of torsemide). ) 60 tablet 0  . rosuvastatin (CRESTOR) 40 MG tablet Take 1 tablet (40 mg total) by mouth at bedtime. 60 tablet 3  . spironolactone (ALDACTONE) 50 MG tablet TAKE 1 TABLET BY MOUTH DAILY. MAY USE 2 TABLETS OF 25MG DAILY FOR NOW (Patient taking differently: Take 50 mg by mouth daily. ) 90 tablet 1  . torsemide (DEMADEX) 20 MG tablet Take 1 tablet (20 mg total) by mouth 2 (two) times daily. 60 tablet 3  . vitamin B-12 (CYANOCOBALAMIN) 1000 MCG tablet Take 1,000 mcg by mouth daily.     . Vitamin D, Ergocalciferol, (DRISDOL) 50000 units CAPS capsule Take 50,000 Units by mouth every Thursday.      No current facility-administered medications on file prior to visit.     Review of Systems  Constitutional: Negative for malaise/fatigue and weight loss.  Respiratory: Positive for shortness of breath (on Exertion, very mild). Negative for cough and hemoptysis.  Cardiovascular: Positive for leg swelling. Negative for chest pain, palpitations and claudication.  Gastrointestinal: Negative for abdominal pain, blood in stool, constipation,  heartburn and vomiting.  Genitourinary: Negative for dysuria.  Musculoskeletal: Negative for joint pain and myalgias.  Neurological: Negative for dizziness, focal weakness and headaches.  Endo/Heme/Allergies: Does not bruise/bleed easily.  Psychiatric/Behavioral: Negative for depression. The patient is not nervous/anxious.   All other systems reviewed and are negative.      Objective:  Blood pressure (!) 137/49, pulse (!) 52, height 5' 3.5" (1.613 m), weight 169 lb (76.7 kg), SpO2 100 %. Body mass index is 29.47 kg/m.  Physical Exam  Constitutional: She is oriented to person, place, and time. She appears well-developed. No distress.  Mildly obese  HENT:  Head: Normocephalic and atraumatic.  Eyes: Conjunctivae are normal.  Neck: Normal range of motion. Neck supple. No thyromegaly present.  Short neck and difficult to evaluate JVP  Cardiovascular: Normal rate. An irregular rhythm present. Exam reveals no gallop.  Murmur heard.  Early systolic murmur is present with a grade of 2/6 at the apex. Pulses:      Carotid pulses are on the right side with bruit and 2+ on the left side.      Dorsalis pedis pulses are 0 on the right side and 0 on the left side.       Posterior tibial pulses are 0 on the right side and 0 on the left side.  2+ pitting edema L>R  Pulmonary/Chest: Effort normal and breath sounds normal. No accessory muscle usage. No respiratory distress. She has no decreased breath sounds.  Abdominal: Soft. Bowel sounds are normal.  Musculoskeletal: Normal range of motion.        General: No edema.  Neurological: She is alert and oriented to person, place, and time.  Skin: Skin is warm and dry.  Psychiatric: She has a normal mood and affect.  Vitals reviewed.   CARDIAC STUDIES:   EKG [facility] 01/20/2019: Sinus rhythm Left bundle branch block  Echocardiogram 01/20/2019: Left Ventricle: The left ventricle has severely reduced systolic function, with an ejection fraction of 25-30%.  The cavity size was moderately dilated. There is no increase in left ventricular wall thickness. Left ventricular diastolic Doppler parameters are consistent with restrictive filling. Elevated left ventricular end-diastolic pressure Left ventrical global hypokinesis without regional wall motion abnormalities. Right Ventricle: The right ventricle has normal systolic function. The cavity was normal. There is no increase in right ventricular wall thickness. Left Atrium: left atrial size was moderately dilated Right Atrium: right atrial size was moderately dilated. Right atrial pressure is estimated at 15 mmHg. Interatrial Septum: The interatrial septum was not well visualized. Pericardium: There is no evidence of pericardial effusion. Mitral Valve: The mitral valve is normal in structure. Mild thickening of the mitral valve leaflet. Mild calcification of the mitral valve leaflet. Mitral valve regurgitation is moderate to severe by color flow Doppler. Tricuspid Valve: The tricuspid valve is normal in structure. Tricuspid valve regurgitation is mild by color flow Doppler. Aortic Valve: The aortic valve is tricuspid Mild thickening of the aortic valve Mild calcification of the aortic valve. Aortic valve regurgitation is mild to moderate by color flow Doppler. Pulmonic Valve: The pulmonic valve was grossly normal. Pulmonic valve regurgitation is mild by color flow Doppler.  Carotid artery duplex 12/02/2018: No hemodynamically significant arterial disease in the internal carotid artery bilaterally. Minimal plaque noted bilateral carotid arteries. Antegrade right vertebral artery flow. Antegrade left vertebral artery flow.  EKG 07/29/2018:  Normal sinus rhythm at 78 bpm with PACs, normal axis, nonspecific T wave abnormality. Baseline artifact. Compared to EKG in June 2019, PACs are new and bradycardia has resolved.  Coronary angiogram 04/26/2018: stenting Resolute 2.0 X 12 mm Ramus intermedius. Resolute Onyx  3.0 X 15 mm drug-eluting stent to ostial LAD placed on 12/30/2017 widely patent. Mid Cx 40%.  Echocardiogram 04/26/2018: Left ventricle: Wall thickness was increased in a pattern of moderate LVH. Systolic function was mildly reduced. The estimated ejection fraction was in the range of 45% to 50%. Features are consistent with a pseudonormal left ventricular filling pattern, with concomitant abnormal relaxation and increased filling pressure (grade 2 diastolic dysfunction). - Aortic valve: There was mild to moderate regurgitation. - Mitral valve: There was mild to moderate regurgitation. - Left atrium: The atrium was mildly dilated. - Pulmonary arteries: PA peak pressure: 37 mm Hg (S). No significant change from Echocardiogram 12/23/2016.  ABI 01/22/2017: This exam reveals mildly decreased perfusion of the right lower extremity, with RABI 0.92 and moderately decreased perfusion of the left lower extremity, with LABI 0.67 noted at the post tibial artery level. Moderately abnormal waveforms of the right ankle. Severely abnormal waveforms of the left ankle.  Recent Labs:   CMP Latest Ref Rng & Units 01/23/2019  Glucose 70 - 99 mg/dL 116(H)  BUN 8 - 23 mg/dL 18  Creatinine 0.44 - 1.00 mg/dL 1.17(H)  Sodium 135 - 145 mmol/L 139  Potassium 3.5 - 5.1 mmol/L 3.6  Chloride 98 - 111 mmol/L 102  CO2 22 - 32 mmol/L 27  Calcium 8.9 - 10.3 mg/dL 9.0  Total Protein 6.5 - 8.1 g/dL -  Total Bilirubin 0.3 - 1.2 mg/dL -  Alkaline Phos 38 - 126 U/L -  AST 15 - 41 U/L -  ALT 0 - 44 U/L -  eGFR non Af Amer >60 mL/min 45 (L)    CBC Latest Ref Rng & Units 01/21/2019  WBC 4.0 - 10.5 K/uL 4.0  Hemoglobin 12.0 - 15.0 g/dL 11.1(L)  Hematocrit 36.0 - 46.0 % 34.3(L)  Platelets 150 - 400 K/uL 112(L)   10/10/2018: BNP 282. 07/08/2018: Cholesterol 166, triglycerides 137, HDL 55, LDL 84. HgbA1c 8.3%.     Assessment & Recommendations:  1. Acute on chronic combined systolic and diastolic heart failure (HCC) Symptoms  have improved since being discharged. Will continue with current medications for now. I am concerned about her significant drop in LVEF and moderate to severe MR compared to echo in June 2019. Given her recent exacerbation and changes on echocardiogram, feel that she should be further evaluated with coronary angiogram. Will schedule for this in the next 1-2 weeks. Schedule for cardiac catheterization, and possible angioplasty. We discussed regarding risks, benefits, alternatives to this including stress testing, CTA and continued medical therapy. Patient wants to proceed. Understands <1-2% risk of death, stroke, MI, urgent CABG, bleeding, infection, renal failure but not limited to these. Encouraged her to continue to be very strict with her diet with avoiding salt.   2. Coronary artery disease involving native coronary artery of native heart without angina pectoris As stated above, question if her recent exacerbation and significant LVEF depression is from progression of CAD. Will further evaluate with cath. Continue with current medicaitons.  3. On supplemental oxygen therapy She is currently on 2L/min and will continue for now. Will arrange for oxygen concentrator to make it easier for her with going outside the home.   4. Essential hypertension Blood pressure is well controlled on  current therapy. Continue the same.  5. CKD stage 3 due to type 2 diabetes mellitus ([place]) Kidney function has remained stable, but will request that PCP check BMP along with BNP at her follow up next week for close monitoring. She has also been taking potassium twice a day due to leg cramps that improved with doing this. As her potassium level was low normal on 03/08, she can continue this, but am concerned about possible hyperkalemia as she is also on Aldactone and Losartan. Asked her to only take 1/2 tablet in the afternoon until potassium can be reevaluated.   Recommendation: Will schedule for cath in the next 1-2  weeks and see her back after this. Encouraged her to have low threshold to contact me for any worsening problems.  Patient is enrolled in [place] trial    Jeri Lager, MSN, APRN, FNP-C 01/28/2019, 8:01 AM [facility]. [place] Office: [phone] Fax: [phone]

## 2019-01-27 NOTE — Progress Notes (Signed)
Subjective:  Primary Physician:  [name], PA-C  [name], female    DOB: August 18, 1941, 78 y.o.   MRN: [id number]  Chief Complaint  Patient presents with  . Hypertension    Hosp F/U      HPI: Sara Graves  is a 78 y.o. female  with hypertension, uncontrolled type 2 DM, CAD s/p prior LAD and ramus PCI, HFpEF, h/o Rt leg cellulitis and metatarsal fracture, managed by Podiatry.  Here today for hospital follow up on 01/23/2019 for CHF exacerbation and hypoxic acute respiratory failure. She reports a few days prior to going to the hospital, she developed significant shortness of breath, orthopnea, and leg swelling. She was aggressively diuresed with changing to Torsemide. She is now on continuous oxygen at 2 L.  Since being home, symptoms have improved. States that she is feeling better. Continues to have leg swelling and shortness of breath on exertion. Continues to have decreased appetite and reports that she is being very careful with her diet. States that she has been taking potassium supplement twice a day as she started having leg cramps.   She was started on Lisinopril at her last office visit as she did not tolerate due to abdominal pain. She is tolerating losartan well.     Past Medical History:  Diagnosis Date  . Anxiety   . Arthritis    "hands" (12/29/2017)  . Basal cell carcinoma of nose    removed  . Bursitis of left shoulder   . Cat scratch fever    Late 90s  . Coronary artery disease   . Depression   . Diabetes mellitus, type II, insulin dependent ([place])   . GERD (gastroesophageal reflux disease)   . H. pylori infection 2008 and 1998   treated  . Hyperlipidemia   . Hypertension   . Hypothyroidism   . Low oxygen saturation   . Multinodular goiter   . Rotator cuff tear, left recurrent   . Urge urinary incontinence   . UTI (lower urinary tract infection) 05/2016    Past Surgical History:  Procedure Laterality Date  .  APPENDECTOMY  AGE 5  . BACK SURGERY    . BASAL CELL CARCINOMA EXCISION     "nose"  . BLADDER NECK SUSPENSION  1970's  . BUNIONECTOMY WITH HAMMERTOE RECONSTRUCTION Bilateral   . CARPAL TUNNEL RELEASE Right 05/2017  . COLONOSCOPY W/ POLYPECTOMY    . CORONARY ANGIOPLASTY WITH STENT PLACEMENT  12/29/2017  . CORONARY STENT INTERVENTION N/A 12/29/2017   Procedure: CORONARY STENT INTERVENTION;  Surgeon: Nigel Mormon, MD;  Location: [facility];  Service: Cardiovascular;  Laterality: N/A;  . CORONARY STENT INTERVENTION N/A 04/26/2018   Procedure: CORONARY STENT INTERVENTION;  Surgeon: Nigel Mormon, MD;  Location: [facility];  Service: Cardiovascular;  Laterality: N/A;  . ESOPHAGOGASTRODUODENOSCOPY ENDOSCOPY    . FORAMINAL DECOMPRESSION AT L2 TO THE SACRUM  01-05-2008   L2  -  S1  . GANGLION CYST EXCISION Left 01/17/2009   ring finger  . IMPLANTATION PERMANENT SPINAL CORD STIMULATOR  06-15-2008   JUNE 2013--  BATTERY CHANGE  . JOINT REPLACEMENT    . LEFT HEART CATH AND CORONARY ANGIOGRAPHY N/A 04/26/2018   Procedure: LEFT HEART CATH AND CORONARY ANGIOGRAPHY;  Surgeon: Nigel Mormon, MD;  Location: [facility];  Service: Cardiovascular;  Laterality: N/A;  . LIPOMA EXCISION Right    RIGHT ELBOW  . METATARSAL HEAD EXCISION Right 06/16/2018  Procedure: right 5th metatarsal excision, a mini c-arm;  Surgeon: Trula Slade, DPM;  Location: [facility];  Service: Podiatry;  Laterality: Right;  anesthesia can do block  . RIGHT/LEFT HEART CATH AND CORONARY ANGIOGRAPHY N/A 12/29/2017   Procedure: RIGHT/LEFT HEART CATH AND CORONARY ANGIOGRAPHY;  Surgeon: Nigel Mormon, MD;  Location: [facility];  Service: Cardiovascular;  Laterality: N/A;  . SHOULDER OPEN ROTATOR CUFF REPAIR  09/07/2012   Procedure: ROTATOR CUFF REPAIR SHOULDER OPEN;  Surgeon: Magnus Sinning, MD;  Location: [facility];  Service: Orthopedics;  Laterality: Left;  OPEN  ANTERIOR ACROMIONECTOMY AND ROTATOR CUFF REPAIR ON LEFT   . SHOULDER OPEN ROTATOR CUFF REPAIR Left 12/28/2012   Procedure: ROTATOR CUFF REPAIR SHOULDER OPEN;  Surgeon: Magnus Sinning, MD;  Location: [facility];  Service: Orthopedics;  Laterality: Left;  OPEN SHOULDER ROTATOR CUFF REPAIR ON LEFT  WITH ANTERIOR ACROMINECTOMY   . SHOULDER OPEN ROTATOR CUFF REPAIR Right 03/28/2003   RIGHT SHOULDER  DEGENERATIVE AC JOINT AND RC TEAR  . SPINAL CORD STIMULATOR BATTERY EXCHANGE N/A 07/03/2016   Procedure: SPINAL CORD STIMULATOR BATTERY PLACMENT;  Surgeon: Melina Schools, MD;  Location: [place];  Service: Orthopedics;  Laterality: N/A;  . TONSILLECTOMY  AGE 15  . 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 VAGINAL HYSTERECTOMY  1970's    Social History   Socioeconomic History  . Marital status: Married    Spouse name: Not on file  . Number of children: 2  . Years of education: 11th  . Highest education level: Not on file  Occupational History  . Occupation: Retired  Scientific laboratory technician  . Financial resource strain: Not on file  . Food insecurity:    Worry: Not on file    Inability: Not on file  . Transportation needs:    Medical: Not on file    Non-medical: Not on file  Tobacco Use  . Smoking status: Never Smoker  . Smokeless tobacco: Never Used  Substance and Sexual Activity  . Alcohol use: No  . Drug use: No  . Sexual activity: Yes  Lifestyle  . Physical activity:    Days per week: Not on file    Minutes per session: Not on file  . Stress: Not on file  Relationships  . Social connections:    Talks on phone: Not on file    Gets together: Not on file    Attends religious service: Not on file    Active member of club or organization: Not on file    Attends meetings of clubs or organizations: Not on file    Relationship status: Not on file  . Intimate partner violence:    Fear of current or ex partner: Not on file    Emotionally abused: Not on file     Physically abused: Not on file    Forced sexual activity: Not on file  Other Topics Concern  . Not on file  Social History Narrative   Pt lives with her husband and 1 grown son in [place]. Younger son has muscular dystrophy. Having a difficult time as primary caregiver to son who is not doing well.  Declined respite care/placement.   Caffeine Use: none; very little    Current Outpatient Medications on File Prior to Visit  Medication Sig Dispense Refill  . acetaminophen (TYLENOL) 500 MG tablet Take 500-1,000 mg by mouth every 6 (six) hours as needed for headache (pain).    Marland Kitchen  amLODipine (NORVASC) 5 MG tablet Take 1 tablet (5 mg total) by mouth daily. 60 tablet 1  . aspirin EC 81 MG tablet Take 81 mg by mouth daily.    . busPIRone (BUSPAR) 7.5 MG tablet Take 7.5 mg by mouth 2 (two) times daily.    . Calcium Carb-Cholecalciferol (CALCIUM 600 + D PO) Take 600 mg by mouth daily.     . clopidogrel (PLAVIX) 75 MG tablet Take 75 mg by mouth daily.     Marland Kitchen ezetimibe (ZETIA) 10 MG tablet Take 10 mg by mouth daily.    Marland Kitchen FLUoxetine (PROZAC) 20 MG capsule Take 20 mg by mouth daily.   1  . glucose blood (ONETOUCH VERIO) test strip USE 1 STRIP 3 (THREE) TIMES A DAY.    Marland Kitchen insulin lispro (HUMALOG) 100 UNIT/ML injection Inject 2-6 Units into the skin See admin instructions. For use with V - GO 20 Insulin Delivery Device: inject 6 units subcutaneously three times daily before meals, inject 2 units at bedtime if eating a bedtime snack    . Investigational - Study Medication Take 1 tablet by mouth daily. Study name: dapagliflozin/metformin ER 10/1000mg Additional study details: This is a Drug Study medication from Dr. Nadyne Coombes at [facility]. Patient started taking this medication on 04/22/18 and is unsure of how long she is to take this medication for. Per patient, she is on this medication as part of a Heart and Diabetes drug study. 1 each PRN  . isosorbide mononitrate (IMDUR) 60 MG 24 hr tablet Take 60 mg by  mouth daily.    Marland Kitchen labetalol (NORMODYNE) 100 MG tablet Take 100 mg by mouth 2 (two) times daily.    Marland Kitchen levothyroxine (SYNTHROID, LEVOTHROID) 50 MCG tablet Take 50 mcg by mouth daily before breakfast.     . nitroGLYCERIN (NITROSTAT) 0.4 MG SL tablet Place 1 tablet (0.4 mg total) under the tongue every 5 (five) minutes x 3 doses as needed for chest pain. 30 tablet 1  . ONETOUCH VERIO test strip USE 1 STRIP 3 (THREE) TIMES A DAY.  5  . pantoprazole (PROTONIX) 40 MG tablet Take 1 tablet (40 mg total) by mouth 2 (two) times daily. (Patient taking differently: Take 40 mg by mouth daily as needed (acid reflux). ) 60 tablet 1  . potassium chloride SA (KLOR-CON M20) 20 MEQ tablet Take 1 tablet (20 mEq total) by mouth 2 (two) times daily. With Torsemide as needed for leg swelling (Patient taking differently: Take 20 mEq by mouth 2 (two) times daily as needed (with each dose of torsemide). ) 60 tablet 0  . rosuvastatin (CRESTOR) 40 MG tablet Take 1 tablet (40 mg total) by mouth at bedtime. 60 tablet 3  . spironolactone (ALDACTONE) 50 MG tablet TAKE 1 TABLET BY MOUTH DAILY. MAY USE 2 TABLETS OF 25MG DAILY FOR NOW (Patient taking differently: Take 50 mg by mouth daily. ) 90 tablet 1  . torsemide (DEMADEX) 20 MG tablet Take 1 tablet (20 mg total) by mouth 2 (two) times daily. 60 tablet 3  . vitamin B-12 (CYANOCOBALAMIN) 1000 MCG tablet Take 1,000 mcg by mouth daily.     . Vitamin D, Ergocalciferol, (DRISDOL) 50000 units CAPS capsule Take 50,000 Units by mouth every Thursday.      No current facility-administered medications on file prior to visit.     Review of Systems  Constitutional: Negative for malaise/fatigue and weight loss.  Respiratory: Positive for shortness of breath (on Exertion, very mild). Negative for cough and hemoptysis.  Cardiovascular: Positive for leg swelling. Negative for chest pain, palpitations and claudication.  Gastrointestinal: Negative for abdominal pain, blood in stool, constipation,  heartburn and vomiting.  Genitourinary: Negative for dysuria.  Musculoskeletal: Negative for joint pain and myalgias.  Neurological: Negative for dizziness, focal weakness and headaches.  Endo/Heme/Allergies: Does not bruise/bleed easily.  Psychiatric/Behavioral: Negative for depression. The patient is not nervous/anxious.   All other systems reviewed and are negative.      Objective:  Blood pressure (!) 137/49, pulse (!) 52, height 5' 3.5" (1.613 m), weight 169 lb (76.7 kg), SpO2 100 %. Body mass index is 29.47 kg/m.  Physical Exam  Constitutional: She is oriented to person, place, and time. She appears well-developed. No distress.  Mildly obese  HENT:  Head: Normocephalic and atraumatic.  Eyes: Conjunctivae are normal.  Neck: Normal range of motion. Neck supple. No thyromegaly present.  Short neck and difficult to evaluate JVP  Cardiovascular: Normal rate. An irregular rhythm present. Exam reveals no gallop.  Murmur heard.  Early systolic murmur is present with a grade of 2/6 at the apex. Pulses:      Carotid pulses are on the right side with bruit and 2+ on the left side.      Dorsalis pedis pulses are 0 on the right side and 0 on the left side.       Posterior tibial pulses are 0 on the right side and 0 on the left side.  2+ pitting edema L>R  Pulmonary/Chest: Effort normal and breath sounds normal. No accessory muscle usage. No respiratory distress. She has no decreased breath sounds.  Abdominal: Soft. Bowel sounds are normal.  Musculoskeletal: Normal range of motion.        General: No edema.  Neurological: She is alert and oriented to person, place, and time.  Skin: Skin is warm and dry.  Psychiatric: She has a normal mood and affect.  Vitals reviewed.   CARDIAC STUDIES:   EKG [facility] 01/20/2019: Sinus rhythm Left bundle branch block  Echocardiogram 01/20/2019: Left Ventricle: The left ventricle has severely reduced systolic function, with an ejection fraction of 25-30%.  The cavity size was moderately dilated. There is no increase in left ventricular wall thickness. Left ventricular diastolic Doppler parameters are consistent with restrictive filling. Elevated left ventricular end-diastolic pressure Left ventrical global hypokinesis without regional wall motion abnormalities. Right Ventricle: The right ventricle has normal systolic function. The cavity was normal. There is no increase in right ventricular wall thickness. Left Atrium: left atrial size was moderately dilated Right Atrium: right atrial size was moderately dilated. Right atrial pressure is estimated at 15 mmHg. Interatrial Septum: The interatrial septum was not well visualized. Pericardium: There is no evidence of pericardial effusion. Mitral Valve: The mitral valve is normal in structure. Mild thickening of the mitral valve leaflet. Mild calcification of the mitral valve leaflet. Mitral valve regurgitation is moderate to severe by color flow Doppler. Tricuspid Valve: The tricuspid valve is normal in structure. Tricuspid valve regurgitation is mild by color flow Doppler. Aortic Valve: The aortic valve is tricuspid Mild thickening of the aortic valve Mild calcification of the aortic valve. Aortic valve regurgitation is mild to moderate by color flow Doppler. Pulmonic Valve: The pulmonic valve was grossly normal. Pulmonic valve regurgitation is mild by color flow Doppler.  Carotid artery duplex 12/02/2018: No hemodynamically significant arterial disease in the internal carotid artery bilaterally. Minimal plaque noted bilateral carotid arteries. Antegrade right vertebral artery flow. Antegrade left vertebral artery flow.  EKG 07/29/2018:  Normal sinus rhythm at 78 bpm with PACs, normal axis, nonspecific T wave abnormality. Baseline artifact. Compared to EKG in June 2019, PACs are new and bradycardia has resolved.  Coronary angiogram 04/26/2018: stenting Resolute 2.0 X 12 mm Ramus intermedius. Resolute Onyx  3.0 X 15 mm drug-eluting stent to ostial LAD placed on 12/30/2017 widely patent. Mid Cx 40%.  Echocardiogram 04/26/2018: Left ventricle: Wall thickness was increased in a pattern of moderate LVH. Systolic function was mildly reduced. The estimated ejection fraction was in the range of 45% to 50%. Features are consistent with a pseudonormal left ventricular filling pattern, with concomitant abnormal relaxation and increased filling pressure (grade 2 diastolic dysfunction). - Aortic valve: There was mild to moderate regurgitation. - Mitral valve: There was mild to moderate regurgitation. - Left atrium: The atrium was mildly dilated. - Pulmonary arteries: PA peak pressure: 37 mm Hg (S). No significant change from Echocardiogram 12/23/2016.  ABI 01/22/2017: This exam reveals mildly decreased perfusion of the right lower extremity, with RABI 0.92 and moderately decreased perfusion of the left lower extremity, with LABI 0.67 noted at the post tibial artery level. Moderately abnormal waveforms of the right ankle. Severely abnormal waveforms of the left ankle.  Recent Labs:   CMP Latest Ref Rng & Units 01/23/2019  Glucose 70 - 99 mg/dL 116(H)  BUN 8 - 23 mg/dL 18  Creatinine 0.44 - 1.00 mg/dL 1.17(H)  Sodium 135 - 145 mmol/L 139  Potassium 3.5 - 5.1 mmol/L 3.6  Chloride 98 - 111 mmol/L 102  CO2 22 - 32 mmol/L 27  Calcium 8.9 - 10.3 mg/dL 9.0  Total Protein 6.5 - 8.1 g/dL -  Total Bilirubin 0.3 - 1.2 mg/dL -  Alkaline Phos 38 - 126 U/L -  AST 15 - 41 U/L -  ALT 0 - 44 U/L -  eGFR non Af Amer >60 mL/min 45 (L)    CBC Latest Ref Rng & Units 01/21/2019  WBC 4.0 - 10.5 K/uL 4.0  Hemoglobin 12.0 - 15.0 g/dL 11.1(L)  Hematocrit 36.0 - 46.0 % 34.3(L)  Platelets 150 - 400 K/uL 112(L)   10/10/2018: BNP 282. 07/08/2018: Cholesterol 166, triglycerides 137, HDL 55, LDL 84. HgbA1c 8.3%.     Assessment & Recommendations:  1. Acute on chronic combined systolic and diastolic heart failure (HCC) Symptoms  have improved since being discharged. Will continue with current medications for now. I am concerned about her significant drop in LVEF and moderate to severe MR compared to echo in June 2019. Given her recent exacerbation and changes on echocardiogram, feel that she should be further evaluated with coronary angiogram. Will schedule for this in the next 1-2 weeks. Schedule for cardiac catheterization, and possible angioplasty. We discussed regarding risks, benefits, alternatives to this including stress testing, CTA and continued medical therapy. Patient wants to proceed. Understands <1-2% risk of death, stroke, MI, urgent CABG, bleeding, infection, renal failure but not limited to these. Encouraged her to continue to be very strict with her diet with avoiding salt.   2. Coronary artery disease involving native coronary artery of native heart without angina pectoris As stated above, question if her recent exacerbation and significant LVEF depression is from progression of CAD. Will further evaluate with cath. Continue with current medicaitons.  3. On supplemental oxygen therapy She is currently on 2L/min and will continue for now. Will arrange for oxygen concentrator to make it easier for her with going outside the home.   4. Essential hypertension Blood pressure is well controlled on  current therapy. Continue the same.  5. CKD stage 3 due to type 2 diabetes mellitus ([place]) Kidney function has remained stable, but will request that PCP check BMP along with BNP at her follow up next week for close monitoring. She has also been taking potassium twice a day due to leg cramps that improved with doing this. As her potassium level was low normal on 03/08, she can continue this, but am concerned about possible hyperkalemia as she is also on Aldactone and Losartan. Asked her to only take 1/2 tablet in the afternoon until potassium can be reevaluated.   Recommendation: Will schedule for cath in the next 1-2  weeks and see her back after this. Encouraged her to have low threshold to contact me for any worsening problems.  Patient is enrolled in [place] trial    Jeri Lager, MSN, APRN, FNP-C 01/28/2019, 8:01 AM [facility]. [place] Office: [phone] Fax: [phone]

## 2019-01-28 NOTE — Telephone Encounter (Signed)
ADDENDUM:  I talked to Sara Graves over the phone and explained the process to her.

## 2019-01-31 ENCOUNTER — Other Ambulatory Visit: Payer: Self-pay

## 2019-01-31 MED ORDER — EZETIMIBE 10 MG PO TABS: 10.0000 mg | ORAL_TABLET | Freq: Every day | ORAL | 2 refills | Status: DC

## 2019-02-02 ENCOUNTER — Ambulatory Visit (INDEPENDENT_AMBULATORY_CARE_PROVIDER_SITE_OTHER): Payer: Medicare Other | Admitting: Podiatry

## 2019-02-02 ENCOUNTER — Other Ambulatory Visit: Payer: Self-pay

## 2019-02-02 DIAGNOSIS — T148XXA Other injury of unspecified body region, initial encounter: Secondary | ICD-10-CM | POA: Diagnosis not present

## 2019-02-02 DIAGNOSIS — L601 Onycholysis: Secondary | ICD-10-CM

## 2019-02-06 NOTE — Progress Notes (Signed)
Subjective:   [name], female   DOB: 78 y.o.   MRN: [id number]   HPI Patient presents stating that she is getting ready to have a heart procedure done and was concerned about bleeding of her left big toenail and wants to make sure there is no infection before procedure   ROS      Objective:  Physical Exam  Neurovascular status intact negative Homans sign was noted with patient's left hallux found to be bleeding from previous injury with irritation of the nailbed itself but no active drainage or proximal edema erythema noted     Assessment:  Traumatized left hallux which appears to be very much localized     Plan:  Instructed on soaks for this and utilization of bandage technique and compression dressing applied to the digit.  Patient should be fine for heart surgery and is cleared for this and will be seen back by me as needed

## 2019-02-08 ENCOUNTER — Encounter (HOSPITAL_COMMUNITY)
Admission: RE | Disposition: A | Discharge status: Home / Self Care | Payer: Self-pay | Source: Home / Self Care | Attending: Cardiology

## 2019-02-08 ENCOUNTER — Ambulatory Visit (HOSPITAL_COMMUNITY)
Admission: RE | Admit: 2019-02-08 | Discharge: 2019-02-08 | Disposition: A | Discharge status: Home / Self Care | Payer: Medicare Other | Attending: Cardiology | Admitting: Cardiology

## 2019-02-08 ENCOUNTER — Other Ambulatory Visit: Payer: Self-pay

## 2019-02-08 ENCOUNTER — Other Ambulatory Visit: Payer: Self-pay | Admitting: Cardiology

## 2019-02-08 DIAGNOSIS — N183 Chronic kidney disease, stage 3 (moderate): Secondary | ICD-10-CM | POA: Insufficient documentation

## 2019-02-08 DIAGNOSIS — Z79890 Hormone replacement therapy (postmenopausal): Secondary | ICD-10-CM | POA: Diagnosis not present

## 2019-02-08 DIAGNOSIS — F329 Major depressive disorder, single episode, unspecified: Secondary | ICD-10-CM | POA: Insufficient documentation

## 2019-02-08 DIAGNOSIS — I5043 Acute on chronic combined systolic (congestive) and diastolic (congestive) heart failure: Secondary | ICD-10-CM | POA: Diagnosis not present

## 2019-02-08 DIAGNOSIS — Z79899 Other long term (current) drug therapy: Secondary | ICD-10-CM | POA: Insufficient documentation

## 2019-02-08 DIAGNOSIS — I130 Hypertensive heart and chronic kidney disease with heart failure and stage 1 through stage 4 chronic kidney disease, or unspecified chronic kidney disease: Secondary | ICD-10-CM | POA: Diagnosis not present

## 2019-02-08 DIAGNOSIS — R0609 Other forms of dyspnea: Secondary | ICD-10-CM | POA: Diagnosis not present

## 2019-02-08 DIAGNOSIS — I5041 Acute combined systolic (congestive) and diastolic (congestive) heart failure: Secondary | ICD-10-CM

## 2019-02-08 DIAGNOSIS — E1122 Type 2 diabetes mellitus with diabetic chronic kidney disease: Secondary | ICD-10-CM | POA: Diagnosis not present

## 2019-02-08 DIAGNOSIS — Z7982 Long term (current) use of aspirin: Secondary | ICD-10-CM | POA: Insufficient documentation

## 2019-02-08 DIAGNOSIS — I25118 Atherosclerotic heart disease of native coronary artery with other forms of angina pectoris: Secondary | ICD-10-CM | POA: Insufficient documentation

## 2019-02-08 DIAGNOSIS — Z794 Long term (current) use of insulin: Secondary | ICD-10-CM | POA: Diagnosis not present

## 2019-02-08 DIAGNOSIS — R0602 Shortness of breath: Secondary | ICD-10-CM

## 2019-02-08 DIAGNOSIS — I2510 Atherosclerotic heart disease of native coronary artery without angina pectoris: Secondary | ICD-10-CM | POA: Diagnosis present

## 2019-02-08 DIAGNOSIS — E785 Hyperlipidemia, unspecified: Secondary | ICD-10-CM | POA: Insufficient documentation

## 2019-02-08 DIAGNOSIS — Z955 Presence of coronary angioplasty implant and graft: Secondary | ICD-10-CM

## 2019-02-08 DIAGNOSIS — K219 Gastro-esophageal reflux disease without esophagitis: Secondary | ICD-10-CM | POA: Diagnosis not present

## 2019-02-08 DIAGNOSIS — E039 Hypothyroidism, unspecified: Secondary | ICD-10-CM | POA: Diagnosis not present

## 2019-02-08 HISTORY — PX: CORONARY BALLOON ANGIOPLASTY: CATH118233

## 2019-02-08 HISTORY — PX: CORONARY STENT INTERVENTION: CATH118234

## 2019-02-08 HISTORY — PX: LEFT HEART CATH AND CORONARY ANGIOGRAPHY: CATH118249

## 2019-02-08 LAB — GLUCOSE, CAPILLARY
Glucose-Capillary: 150 mg/dL — ABNORMAL HIGH (ref 70–99)
Glucose-Capillary: 142 mg/dL — ABNORMAL HIGH (ref 70–99)

## 2019-02-08 LAB — POCT ACTIVATED CLOTTING TIME
ACTIVATED CLOTTING TIME: 268 s
Activated Clotting Time: 285 seconds

## 2019-02-08 SURGERY — LEFT HEART CATH AND CORONARY ANGIOGRAPHY
Anesthesia: LOCAL

## 2019-02-08 MED ORDER — ASPIRIN 81 MG PO CHEW: 81.0000 mg | CHEWABLE_TABLET | ORAL | Status: DC

## 2019-02-08 MED ORDER — SODIUM CHLORIDE 0.9% FLUSH: 3.0000 mL | INTRAVENOUS | Status: DC | PRN

## 2019-02-08 MED ORDER — NITROGLYCERIN 0.4 MG SL SUBL
SUBLINGUAL_TABLET | SUBLINGUAL | Status: AC
  Administered 2019-02-08: 0.4 mg via SUBLINGUAL
  Filled 2019-02-08: qty 1

## 2019-02-08 MED ORDER — HYDRALAZINE HCL 20 MG/ML IJ SOLN: 5.0000 mg | INTRAMUSCULAR | Status: AC | PRN

## 2019-02-08 MED ORDER — FENTANYL CITRATE (PF) 100 MCG/2ML IJ SOLN
INTRAMUSCULAR | Status: AC
  Filled 2019-02-08: qty 2

## 2019-02-08 MED ORDER — SODIUM CHLORIDE 0.9 % IV SOLN
INTRAVENOUS | Status: DC
  Administered 2019-02-08: 06:00:00 via INTRAVENOUS

## 2019-02-08 MED ORDER — ONDANSETRON HCL 4 MG/2ML IJ SOLN: 4.0000 mg | Freq: Four times a day (QID) | INTRAMUSCULAR | Status: DC | PRN

## 2019-02-08 MED ORDER — NITROGLYCERIN 0.4 MG SL SUBL
0.4000 mg | SUBLINGUAL_TABLET | SUBLINGUAL | Status: DC | PRN
  Administered 2019-02-08: 0.4 mg via SUBLINGUAL

## 2019-02-08 MED ORDER — FENTANYL CITRATE (PF) 100 MCG/2ML IJ SOLN
25.0000 ug | Freq: Once | INTRAMUSCULAR | Status: DC
  Filled 2019-02-08: qty 2

## 2019-02-08 MED ORDER — NITROGLYCERIN 1 MG/10 ML FOR IR/CATH LAB
INTRA_ARTERIAL | Status: DC | PRN
  Administered 2019-02-08 (×2): 200 ug

## 2019-02-08 MED ORDER — NITROGLYCERIN 0.4 MG SL SUBL: 0.4000 mg | SUBLINGUAL_TABLET | SUBLINGUAL | 2 refills | Status: DC | PRN

## 2019-02-08 MED ORDER — VERAPAMIL HCL 2.5 MG/ML IV SOLN
INTRAVENOUS | Status: AC
  Filled 2019-02-08: qty 2

## 2019-02-08 MED ORDER — SODIUM CHLORIDE 0.9 % IV SOLN: 250.0000 mL | INTRAVENOUS | Status: DC | PRN

## 2019-02-08 MED ORDER — LOSARTAN POTASSIUM 25 MG PO TABS: 25.0000 mg | ORAL_TABLET | Freq: Every day | ORAL | Status: DC

## 2019-02-08 MED ORDER — SPIRONOLACTONE 25 MG PO TABS: 25.0000 mg | ORAL_TABLET | ORAL | Status: DC

## 2019-02-08 MED ORDER — FENTANYL CITRATE (PF) 100 MCG/2ML IJ SOLN
INTRAMUSCULAR | Status: DC | PRN
  Administered 2019-02-08: 25 ug via INTRAVENOUS
  Administered 2019-02-08: 25 ug

## 2019-02-08 MED ORDER — MIDAZOLAM HCL 2 MG/2ML IJ SOLN
INTRAMUSCULAR | Status: DC | PRN
  Administered 2019-02-08: 1 mg via INTRAVENOUS

## 2019-02-08 MED ORDER — HEPARIN (PORCINE) IN NACL 1000-0.9 UT/500ML-% IV SOLN
INTRAVENOUS | Status: AC
  Filled 2019-02-08: qty 1000

## 2019-02-08 MED ORDER — IOHEXOL 350 MG/ML SOLN
INTRAVENOUS | Status: DC | PRN
  Administered 2019-02-08: 110 mL via INTRA_ARTERIAL

## 2019-02-08 MED ORDER — HEPARIN SODIUM (PORCINE) 1000 UNIT/ML IJ SOLN
INTRAMUSCULAR | Status: AC
  Filled 2019-02-08: qty 2

## 2019-02-08 MED ORDER — MIDAZOLAM HCL 2 MG/2ML IJ SOLN
INTRAMUSCULAR | Status: AC
  Filled 2019-02-08: qty 2

## 2019-02-08 MED ORDER — LIDOCAINE HCL (PF) 1 % IJ SOLN
INTRAMUSCULAR | Status: AC
  Filled 2019-02-08: qty 30

## 2019-02-08 MED ORDER — VERAPAMIL HCL 2.5 MG/ML IV SOLN
INTRAVENOUS | Status: DC | PRN
  Administered 2019-02-08: 10 mL via INTRA_ARTERIAL

## 2019-02-08 MED ORDER — SODIUM CHLORIDE 0.9% FLUSH: 3.0000 mL | Freq: Two times a day (BID) | INTRAVENOUS | Status: DC

## 2019-02-08 MED ORDER — SODIUM CHLORIDE 0.9 % WEIGHT BASED INFUSION: 1.0000 mL/kg/h | INTRAVENOUS | Status: DC

## 2019-02-08 MED ORDER — HEPARIN (PORCINE) IN NACL 1000-0.9 UT/500ML-% IV SOLN
INTRAVENOUS | Status: DC | PRN
  Administered 2019-02-08: 500 mL

## 2019-02-08 MED ORDER — NITROGLYCERIN 1 MG/10 ML FOR IR/CATH LAB
INTRA_ARTERIAL | Status: AC
  Filled 2019-02-08: qty 10

## 2019-02-08 MED ORDER — CLOPIDOGREL BISULFATE 75 MG PO TABS: 75.0000 mg | ORAL_TABLET | ORAL | Status: DC

## 2019-02-08 MED ORDER — HEPARIN SODIUM (PORCINE) 1000 UNIT/ML IJ SOLN
INTRAMUSCULAR | Status: DC | PRN
  Administered 2019-02-08: 5000 [IU] via INTRAVENOUS
  Administered 2019-02-08: 4000 [IU] via INTRAVENOUS
  Administered 2019-02-08: 2000 [IU] via INTRAVENOUS

## 2019-02-08 MED ORDER — LIDOCAINE HCL (PF) 1 % IJ SOLN
INTRAMUSCULAR | Status: DC | PRN
  Administered 2019-02-08: 2 mL

## 2019-02-08 SURGICAL SUPPLY — 17 items
BALLN SAPPHIRE ~~LOC~~ 2.5X10 (BALLOONS) ×2 IMPLANT
BALLN SAPPHIRE ~~LOC~~ 2.5X8 (BALLOONS) IMPLANT
BALLN WOLVERINE 2.50X6 (BALLOONS) ×2
BALLOON WOLVERINE 2.50X6 (BALLOONS) ×1 IMPLANT
CATH OPTITORQUE TIG 4.0 5F (CATHETERS) ×2 IMPLANT
CATH VISTA GUIDE 6FR XB3.5 (CATHETERS) ×2 IMPLANT
DEVICE RAD COMP TR BAND LRG (VASCULAR PRODUCTS) ×2 IMPLANT
GLIDESHEATH SLEND A-KIT 6F 20G (SHEATH) ×2 IMPLANT
GUIDEWIRE INQWIRE 1.5J.035X260 (WIRE) ×1 IMPLANT
INQWIRE 1.5J .035X260CM (WIRE) ×2
KIT ENCORE 26 ADVANTAGE (KITS) ×2 IMPLANT
KIT HEART LEFT (KITS) ×2 IMPLANT
PACK CARDIAC CATHETERIZATION (CUSTOM PROCEDURE TRAY) ×2 IMPLANT
STENT SYNERGY DES 2.5X32 (Permanent Stent) ×2 IMPLANT
TRANSDUCER W/STOPCOCK (MISCELLANEOUS) ×2 IMPLANT
TUBING CIL FLEX 10 FLL-RA (TUBING) ×2 IMPLANT
WIRE COUGAR XT STRL 190CM (WIRE) ×2 IMPLANT

## 2019-02-08 NOTE — Progress Notes (Signed)
[id number] Have seen pt several times in past. Pt weighs daily and knows when to call MD with weight gain and signs of fluid retention.  Watches sodium. Gave low sodium, diabetic and heart healthy diets. Encouraged 2000 mg restrictions. Encouraged walking as tolerated. Pt has sat monitor and has been checking her sats. Wants to come off oxygen and is hoping this procedure helps. Encouraged pt to check sats when up moving. Referred to [place] CRP 2. Reviewed importance of plavix and NTG use.Marland Kitchen Pt voiced understanding. Graylon Good RN BSN 02/08/2019 11:34 AM

## 2019-02-08 NOTE — Interval H&P Note (Signed)
History and Physical Interval Note:  02/08/2019 7:41 AM  Sara Graves  has presented today for surgery, with the diagnosis of Heart Failure.  The various methods of treatment have been discussed with the patient and family. After consideration of risks, benefits and other options for treatment, the patient has consented to  Procedure(s): LEFT HEART CATH AND CORONARY ANGIOGRAPHY (N/A) and possible angioplasty as a surgical intervention.  The patient's history has been reviewed, patient examined, no change in status, stable for surgery.  I have reviewed the patient's chart and labs.  Questions were answered to the patient's satisfaction.    Symptom Status: Ischemic Symptoms Non-invasive Testing: Not done If no or indeterminate stress test, FFR/iFR results in all diseased vessels: Not done Diabetes Mellitus: Yes S/P CABG: No Antianginal therapy (number of long-acting drugs): >=2 Patient undergoing renal transplant: No Patient undergoing percutaneous valve procedure: No   1 Vessel Disease No proximal LAD involvement, No proximal left dominant LCX involvement  PCI: Not rated  CABG: Not rated Proximal left dominant LCX involvement  PCI: Not rated  CABG: Not rated Proximal LAD involvement  PCI: Not rated  CABG: Not rated  2 Vessel Disease No proximal LAD involvement  PCI: Not rated  CABG: Not rated Proximal LAD involvement  PCI: Not rated  CABG: Not rated  3 Vessel Disease Low disease complexity (e.g., focal stenoses, SYNTAX <=22)  PCI: Not rated  CABG: Not rated Intermediate or high disease complexity (e.g., SYNTAX >=23)  PCI: Not rated  CABG: Not rated  Left Main Disease Isolated LMCA disease: ostial or midshaft  PCI: A (7);  Indication 24  CABG: A (9);  Indication 24 Isolated LMCA disease: bifurcation involvement  PCI: M (6);  Indication 25  CABG: A (9);  Indication 25 LMCA ostial or midshaft, concurrent low disease burden multivessel disease (e.g., 1-2 additional  focal stenoses, SYNTAX <=22)  PCI: A (7);  Indication 26  CABG: A (9);  Indication 26 LMCA ostial or midshaft, concurrent intermediate or high disease burden multivessel disease (e.g., 1-2 additional bifurcation stenoses, long stenoses, SYNTAX >=23)  PCI: M (4);  Indication 27  CABG: A (9);  Indication 27 LMCA bifurcation involvement, concurrent low disease burden multivessel disease (e.g., 1-2 additional focal stenoses, SYNTAX <=22)  PCI: M (6);  Indication 28  CABG: A (9);  Indication 28 LMCA bifurcation involvement, concurrent intermediate or high disease burden multivessel disease (e.g., 1-2 additional bifurcation stenoses, long stenoses, SYNTAX >=23)  PCI: R (3);  Indication 29  CABG: A (9);  Indication 29  Notes:  A indicates appropriate. M indicates may be appropriate. R indicates rarely appropriate. Number in parentheses is median score for that indication. Reclassify indicates number of functionally diseased vessels should be decreased given negative FFR/iFR. Re-evaluate the scenario interpreting any FFR/iFR negative vessel as being not significantly stenosed.  Disease means involved vessel provides flow to a sufficient amount of myocardium to be clinically important.  If FFR testing indicates a vessel is not significant, that vessel should not be considered diseased (and the patient should be reclassified with respect to extent of functionally significant disease).  Proximal LAD + proximal left dominant LCX is considered 3 vessel CAD  2 Vessel CAD with FFR/iFR abnormal in only 1 but not both is considered 1 vessel CAD  Disease complexity includes occlusion, bifurcation, trifurcation, ostial, >20 mm, tortuosity, calcification, thrombus  LMCA disease is >=50% by angiography, MLD <2.8 mm, MLA <6 mm2; MLA 6-7.5 mm2 requires further physiologic  See Table  B for risk stratification based on noninvasive testing  Journal of the [employer] of Cardiology Mar 2017, 23391; DOI:  10.1016/j.jacc.2017.02.001 [email or web address].[email or web address].full-text.pdf This App  2018 by the Society for Cardiovascular Angiography and Interventions   Adrian Prows

## 2019-02-08 NOTE — Progress Notes (Signed)
Client arrived from cath lab via stretcher and c/o 3/10 left lower sternum pressure and Dr Einar Gip notified and orders noted; sat 94% and no O2 needed

## 2019-02-08 NOTE — Discharge Instructions (Signed)
HOLD INVESTIGATIONAL-STUDY MEDICATION DAPAGLIFLOZIN/METFORMIN er 10/1000MG     Radial Site Care  This sheet gives you information about how to care for yourself after your procedure. Your health care provider may also give you more specific instructions. If you have problems or questions, contact your health care provider. What can I expect after the procedure? After the procedure, it is common to have:  Bruising and tenderness at the catheter insertion area. Follow these instructions at home: Medicines  Take over-the-counter and prescription medicines only as told by your health care provider. Insertion site care  Follow instructions from your health care provider about how to take care of your insertion site. Make sure you: ? Wash your hands with soap and water before you change your bandage (dressing). If soap and water are not available, use hand sanitizer. ? Change your dressing as told by your health care provider. ? Leave stitches (sutures), skin glue, or adhesive strips in place. These skin closures may need to stay in place for 2 weeks or longer. If adhesive strip edges start to loosen and curl up, you may trim the loose edges. Do not remove adhesive strips completely unless your health care provider tells you to do that.  Check your insertion site every day for signs of infection. Check for: ? Redness, swelling, or pain. ? Fluid or blood. ? Pus or a bad smell. ? Warmth.  Do not take baths, swim, or use a hot tub until your health care provider approves.  You may shower 24-48 hours after the procedure, or as directed by your health care provider. ? Remove the dressing and gently wash the site with plain soap and water. ? Pat the area dry with a clean towel. ? Do not rub the site. That could cause bleeding.  Do not apply powder or lotion to the site. Activity   For 24 hours after the procedure, or as directed by your health care provider: ? Do not flex or bend the  affected arm. ? Do not push or pull heavy objects with the affected arm. ? Do not drive yourself home from the hospital or clinic. You may drive 24 hours after the procedure unless your health care provider tells you not to. ? Do not operate machinery or power tools.  Do not lift anything that is heavier than 10 lb (4.5 kg), or the limit that you are told, until your health care provider says that it is safe.  Ask your health care provider when it is okay to: ? Return to work or school. ? Resume usual physical activities or sports. ? Resume sexual activity. General instructions  If the catheter site starts to bleed, raise your arm and put firm pressure on the site. If the bleeding does not stop, get help right away. This is a medical emergency.  If you went home on the same day as your procedure, a responsible adult should be with you for the first 24 hours after you arrive home.  Keep all follow-up visits as told by your health care provider. This is important. Contact a health care provider if:  You have a fever.  You have redness, swelling, or yellow drainage around your insertion site. Get help right away if:  You have unusual pain at the radial site.  The catheter insertion area swells very fast.  The insertion area is bleeding, and the bleeding does not stop when you hold steady pressure on the area.  Your arm or hand becomes pale, cool, tingly,  or numb. These symptoms may represent a serious problem that is an emergency. Do not wait to see if the symptoms will go away. Get medical help right away. Call your local emergency services (911 in the [place] Do not drive yourself to the hospital. Summary  After the procedure, it is common to have bruising and tenderness at the site.  Follow instructions from your health care provider about how to take care of your radial site wound. Check the wound every day for signs of infection.  Do not lift anything that is heavier than 10  lb (4.5 kg), or the limit that you are told, until your health care provider says that it is safe. This information is not intended to replace advice given to you by your health care provider. Make sure you discuss any questions you have with your health care provider. Document Released: 12/06/2010 Document Revised: 12/09/2017 Document Reviewed: 12/09/2017 Elsevier Interactive Patient Education  2019 [place].    Moderate Conscious Sedation, Adult, Care After These instructions provide you with information about caring for yourself after your procedure. Your health care provider may also give you more specific instructions. Your treatment has been planned according to current medical practices, but problems sometimes occur. Call your health care provider if you have any problems or questions after your procedure. What can I expect after the procedure? After your procedure, it is common:  To feel sleepy for several hours.  To feel clumsy and have poor balance for several hours.  To have poor judgment for several hours.  To vomit if you eat too soon. Follow these instructions at home: For at least 24 hours after the procedure:   Do not: ? Participate in activities where you could fall or become injured. ? Drive. ? Use heavy machinery. ? Drink alcohol. ? Take sleeping pills or medicines that cause drowsiness. ? Make important decisions or sign legal documents. ? Take care of children on your own.  Rest. Eating and drinking  Follow the diet recommended by your health care provider.  If you vomit: ? Drink water, juice, or soup when you can drink without vomiting. ? Make sure you have little or no nausea before eating solid foods. General instructions  Have a responsible adult stay with you until you are awake and alert.  Take over-the-counter and prescription medicines only as told by your health care provider.  If you smoke, do not smoke without supervision.  Keep all  follow-up visits as told by your health care provider. This is important. Contact a health care provider if:  You keep feeling nauseous or you keep vomiting.  You feel light-headed.  You develop a rash.  You have a fever. Get help right away if:  You have trouble breathing. This information is not intended to replace advice given to you by your health care provider. Make sure you discuss any questions you have with your health care provider. Document Released: 08/24/2013 Document Revised: 04/07/2016 Document Reviewed: 02/23/2016 Elsevier Interactive Patient Education  2019 [employer].

## 2019-02-08 NOTE — Progress Notes (Signed)
Old dried drainage under tr band, I let out 1 cc of air and charted it, when I turned back to rechecked, pt bled, I added 3 cc but it was still pulsatile bleeding under the band and I had to add 4 more cc of air. Site is level 0 with good pulse from right  thumb pulse ox. Sara Graves

## 2019-02-09 ENCOUNTER — Encounter (HOSPITAL_COMMUNITY): Payer: Self-pay | Admitting: Cardiology

## 2019-02-10 ENCOUNTER — Telehealth (HOSPITAL_COMMUNITY): Payer: Self-pay

## 2019-02-10 NOTE — Telephone Encounter (Signed)
Called pt to see if she was interested in participating in the cardiac rehab program. Let pt know that we were closed for the next 4 weeks due to the COVID-19 and if she would be interested at a later date pt stated that she would not be interested at all. Closed pt referral. Tedra Senegal. Support Rep II

## 2019-02-10 NOTE — Telephone Encounter (Signed)
Pt insurance is active and benefits verified through [facility] Medicare Co-pay $20.00, DED 0/0 met, out of pocket $4,000/$378.12 met, co-insurance 0%. no pre-authorization required. Passport, 02/10/2019 @ 3:52pm, REF# [id number]  Will contact patient to see if she is interested in the Cardiac Rehab Program. If interested, patient will need to complete follow up appt. Once completed, patient will be contacted for scheduling upon review by the RN Navigator. Tedra Senegal. Support Rep II

## 2019-02-17 ENCOUNTER — Ambulatory Visit: Payer: Medicare Other | Admitting: Cardiology

## 2019-02-17 ENCOUNTER — Other Ambulatory Visit: Payer: Self-pay

## 2019-02-17 ENCOUNTER — Encounter: Payer: Self-pay | Admitting: Cardiology

## 2019-02-17 VITALS — BP 89/32 | HR 46 | Ht 65.0 in | Wt 155.1 lb

## 2019-02-17 DIAGNOSIS — I25118 Atherosclerotic heart disease of native coronary artery with other forms of angina pectoris: Secondary | ICD-10-CM | POA: Diagnosis not present

## 2019-02-17 DIAGNOSIS — I255 Ischemic cardiomyopathy: Secondary | ICD-10-CM | POA: Diagnosis not present

## 2019-02-17 DIAGNOSIS — I5042 Chronic combined systolic (congestive) and diastolic (congestive) heart failure: Secondary | ICD-10-CM | POA: Diagnosis not present

## 2019-02-17 DIAGNOSIS — R0609 Other forms of dyspnea: Secondary | ICD-10-CM

## 2019-02-17 MED ORDER — POTASSIUM CHLORIDE CRYS ER 20 MEQ PO TBCR: 20.0000 meq | EXTENDED_RELEASE_TABLET | ORAL | 0 refills | Status: DC

## 2019-02-17 MED ORDER — TORSEMIDE 20 MG PO TABS: 20.0000 mg | ORAL_TABLET | ORAL | 3 refills | Status: DC

## 2019-02-17 MED ORDER — CLOPIDOGREL BISULFATE 75 MG PO TABS: 75.0000 mg | ORAL_TABLET | Freq: Every day | ORAL | 1 refills | Status: DC

## 2019-02-17 NOTE — Progress Notes (Signed)
Subjective:  Primary Physician:  [name], PA-C  [name], female    DOB: 1941-08-14, 78 y.o.   MRN: [id number]  Chief Complaint  Patient presents with  . Coronary Artery Disease  . Follow-up    7-10 cath    HPI: Sara Graves  is a 78 y.o. female  with hypertension, uncontrolled type 2 DM, CAD s/p prior LAD and ramus PCI, HFpEF, h/o Rt leg cellulitis and metatarsal fracture, managed by Podiatry. Had admission to hospital for CHF on 01/23/2019 due to marked dyspnea and discharged on 2L [place] O2. Echo revealed new decrease in LVEF and underwent coronary angiogram on 01/27/19 and PCI to LAD (new stent) and balloon PTCA to IST RI lesion.   Since then she has noticed marked improvement in dyspnea, no PND or orthopnea, she has not had recurrence of chest pain.  Feels well.  Past Medical History:  Diagnosis Date  . Acute combined systolic and diastolic heart failure ([place]) 04/25/2018  . Anxiety   . Arthritis    "hands" (12/29/2017)  . Basal cell carcinoma of nose    removed  . Bursitis of left shoulder   . Cat scratch fever    Late 90s  . Coronary artery disease   . Depression   . Diabetes mellitus, type II, insulin dependent ([place])   . GERD (gastroesophageal reflux disease)   . H. pylori infection 2008 and 1998   treated  . Hyperlipidemia   . Hypertension   . Hypothyroidism   . Low oxygen saturation   . Multinodular goiter   . Rotator cuff tear, left recurrent   . Urge urinary incontinence   . UTI (lower urinary tract infection) 05/2016    Past Surgical History:  Procedure Laterality Date  . APPENDECTOMY  AGE 37  . BACK SURGERY    . BASAL CELL CARCINOMA EXCISION     "nose"  . BLADDER NECK SUSPENSION  1970's  . BUNIONECTOMY WITH HAMMERTOE RECONSTRUCTION Bilateral   . CARPAL TUNNEL RELEASE Right 05/2017  . COLONOSCOPY W/ POLYPECTOMY    . CORONARY ANGIOPLASTY WITH STENT PLACEMENT  12/29/2017  . CORONARY BALLOON ANGIOPLASTY N/A 02/08/2019    Procedure: CORONARY BALLOON ANGIOPLASTY;  Surgeon: Adrian Prows, MD;  Location: [place] [facility];  Service: Cardiovascular;  Laterality: N/A;  . CORONARY STENT INTERVENTION N/A 12/29/2017   Procedure: CORONARY STENT INTERVENTION;  Surgeon: Nigel Mormon, MD;  Location: [place] [facility];  Service: Cardiovascular;  Laterality: N/A;  . CORONARY STENT INTERVENTION N/A 04/26/2018   Procedure: CORONARY STENT INTERVENTION;  Surgeon: Nigel Mormon, MD;  Location: [place] [facility];  Service: Cardiovascular;  Laterality: N/A;  . CORONARY STENT INTERVENTION N/A 02/08/2019   Procedure: CORONARY STENT INTERVENTION;  Surgeon: Adrian Prows, MD;  Location: [place] [facility];  Service: Cardiovascular;  Laterality: N/A;  . ESOPHAGOGASTRODUODENOSCOPY ENDOSCOPY    . FORAMINAL DECOMPRESSION AT L2 TO THE SACRUM  01-05-2008   L2  -  S1  . GANGLION CYST EXCISION Left 01/17/2009   ring finger  . IMPLANTATION PERMANENT SPINAL CORD STIMULATOR  06-15-2008   JUNE 2013--  BATTERY CHANGE  . JOINT REPLACEMENT    . LEFT HEART CATH AND CORONARY ANGIOGRAPHY N/A 04/26/2018   Procedure: LEFT HEART CATH AND CORONARY ANGIOGRAPHY;  Surgeon: Nigel Mormon, MD;  Location: [place] [facility];  Service: Cardiovascular;  Laterality: N/A;  . LEFT HEART CATH AND CORONARY ANGIOGRAPHY N/A 02/08/2019   Procedure: LEFT HEART  CATH AND CORONARY ANGIOGRAPHY;  Surgeon: Adrian Prows, MD;  Location: [place] [facility];  Service: Cardiovascular;  Laterality: N/A;  . LIPOMA EXCISION Right    RIGHT ELBOW  . METATARSAL HEAD EXCISION Right 06/16/2018   Procedure: right 5th metatarsal excision, a mini c-arm;  Surgeon: Trula Slade, DPM;  Location: [facility];  Service: Podiatry;  Laterality: Right;  anesthesia can do block  . RIGHT/LEFT HEART CATH AND CORONARY ANGIOGRAPHY N/A 12/29/2017   Procedure: RIGHT/LEFT HEART CATH AND CORONARY ANGIOGRAPHY;  Surgeon: Nigel Mormon, MD;  Location: [place] [facility];  Service: Cardiovascular;   Laterality: N/A;  . SHOULDER OPEN ROTATOR CUFF REPAIR  09/07/2012   Procedure: ROTATOR CUFF REPAIR SHOULDER OPEN;  Surgeon: Magnus Sinning, MD;  Location: [place];  Service: Orthopedics;  Laterality: Left;  OPEN ANTERIOR ACROMIONECTOMY AND ROTATOR CUFF REPAIR ON LEFT   . SHOULDER OPEN ROTATOR CUFF REPAIR Left 12/28/2012   Procedure: ROTATOR CUFF REPAIR SHOULDER OPEN;  Surgeon: Magnus Sinning, MD;  Location: [place];  Service: Orthopedics;  Laterality: Left;  OPEN SHOULDER ROTATOR CUFF REPAIR ON LEFT  WITH ANTERIOR ACROMINECTOMY   . SHOULDER OPEN ROTATOR CUFF REPAIR Right 03/28/2003   RIGHT SHOULDER  DEGENERATIVE AC JOINT AND RC TEAR  . SPINAL CORD STIMULATOR BATTERY EXCHANGE N/A 07/03/2016   Procedure: SPINAL CORD STIMULATOR BATTERY PLACMENT;  Surgeon: Melina Schools, MD;  Location: [place];  Service: Orthopedics;  Laterality: N/A;  . TONSILLECTOMY  AGE 30  . 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 VAGINAL HYSTERECTOMY  1970's    Social History   Socioeconomic History  . Marital status: Married    Spouse name: Not on file  . Number of children: 2  . Years of education: 11th  . Highest education level: Not on file  Occupational History  . Occupation: Retired  Scientific laboratory technician  . Financial resource strain: Not on file  . Food insecurity:    Worry: Not on file    Inability: Not on file  . Transportation needs:    Medical: Not on file    Non-medical: Not on file  Tobacco Use  . Smoking status: Never Smoker  . Smokeless tobacco: Never Used  Substance and Sexual Activity  . Alcohol use: No  . Drug use: No  . Sexual activity: Yes  Lifestyle  . Physical activity:    Days per week: Not on file    Minutes per session: Not on file  . Stress: Not on file  Relationships  . Social connections:    Talks on phone: Not on file    Gets together: Not on file    Attends religious service: Not on file    Active member of club or  organization: Not on file    Attends meetings of clubs or organizations: Not on file    Relationship status: Not on file  . Intimate partner violence:    Fear of current or ex partner: Not on file    Emotionally abused: Not on file    Physically abused: Not on file    Forced sexual activity: Not on file  Other Topics Concern  . Not on file  Social History Narrative   Pt lives with her husband and 1 grown son in [place]. Younger son has muscular dystrophy. Having a difficult time as primary caregiver to son who is not doing well.  Declined respite care/placement.   Caffeine Use: none; very  little    Current Outpatient Medications on File Prior to Visit  Medication Sig Dispense Refill  . acetaminophen (TYLENOL) 500 MG tablet Take 500-1,000 mg by mouth every 6 (six) hours as needed for headache (pain).    Marland Kitchen aspirin EC 81 MG tablet Take 81 mg by mouth daily.    . busPIRone (BUSPAR) 7.5 MG tablet Take 7.5 mg by mouth 2 (two) times daily.    . Calcium Carb-Cholecalciferol (CALCIUM 600 + D PO) Take 1 tablet by mouth daily.     Marland Kitchen ezetimibe (ZETIA) 10 MG tablet Take 1 tablet (10 mg total) by mouth daily. 90 tablet 2  . FLUoxetine (PROZAC) 20 MG capsule Take 20 mg by mouth every evening.   1  . glucose blood (ONETOUCH VERIO) test strip 1 each 3 (three) times daily.     . insulin lispro (HUMALOG) 100 UNIT/ML injection Inject 2-6 Units into the skin See admin instructions. For use with V - GO 20 Insulin Delivery Device: inject 6 units subcutaneously three times daily before meals, inject 2 units at bedtime if eating a bedtime snack    . Investigational - Study Medication Take 1 tablet by mouth daily. Study name: dapagliflozin/metformin ER 10/1000mg  Additional study details: This is a Drug Study medication from Dr. Nadyne Coombes at [facility]. Patient started taking this medication on 04/22/18 and is unsure of how long she is to take this medication for. Per patient, she is on this medication as part of  a Heart and Diabetes drug study. 1 each PRN  . isosorbide mononitrate (IMDUR) 60 MG 24 hr tablet Take 60 mg by mouth daily.    Marland Kitchen labetalol (NORMODYNE) 100 MG tablet Take 100 mg by mouth 2 (two) times daily.    Marland Kitchen levothyroxine (SYNTHROID, LEVOTHROID) 50 MCG tablet Take 50 mcg by mouth daily before breakfast.     . losartan (COZAAR) 25 MG tablet Take 1 tablet (25 mg total) by mouth daily.    . nitroGLYCERIN (NITROSTAT) 0.4 MG SL tablet Place 1 tablet (0.4 mg total) under the tongue every 5 (five) minutes x 3 doses as needed for chest pain. 25 tablet 2  . ONETOUCH VERIO test strip 1 each 3 (three) times daily.   5  . pantoprazole (PROTONIX) 40 MG tablet Take 1 tablet (40 mg total) by mouth 2 (two) times daily. (Patient taking differently: Take 40 mg by mouth daily as needed (acid reflux). ) 60 tablet 1  . rosuvastatin (CRESTOR) 40 MG tablet Take 1 tablet (40 mg total) by mouth at bedtime. 60 tablet 3  . spironolactone (ALDACTONE) 25 MG tablet Take 1 tablet (25 mg total) by mouth See admin instructions. Take 1 tablet (25 mg) by mouth in the morning, may repeat dose in the afternoon/evening if needed for fluid or swelling.    . vitamin B-12 (CYANOCOBALAMIN) 1000 MCG tablet Take 1,000 mcg by mouth daily.     . Vitamin D, Ergocalciferol, (DRISDOL) 50000 units CAPS capsule Take 50,000 Units by mouth every Thursday.      No current facility-administered medications on file prior to visit.     Review of Systems  Constitution: Negative for chills, decreased appetite, malaise/fatigue and weight gain.  Cardiovascular: Positive for dyspnea on exertion (mild and improved). Negative for leg swelling and syncope.  Endocrine: Negative for cold intolerance.  Hematologic/Lymphatic: Does not bruise/bleed easily.  Musculoskeletal: Negative for joint swelling.  Gastrointestinal: Negative for abdominal pain, anorexia and change in bowel habit.  Neurological: Negative for headaches and  light-headedness.   Psychiatric/Behavioral: Negative for depression and substance abuse.  All other systems reviewed and are negative.      Objective:  Blood pressure (!) 89/32, pulse (!) 46, height 5\' 5"  (1.651 m), weight 155 lb 1.6 oz (70.4 kg), SpO2 98 %. Body mass index is 25.81 kg/m.  Physical Exam  Constitutional: She appears well-developed and well-nourished. No distress.  HENT:  Head: Atraumatic.  Eyes: Conjunctivae are normal.  Neck: Neck supple. No JVD present. No thyromegaly present.  Cardiovascular: Normal rate, regular rhythm, normal heart sounds and intact distal pulses. Exam reveals no gallop.  No murmur heard. Pulses:      Carotid pulses are 2+ on the right side with bruit and 2+ on the left side.      Femoral pulses are 2+ on the right side and 2+ on the left side.      Popliteal pulses are 0 on the right side and 0 on the left side.       Dorsalis pedis pulses are 0 on the right side and 0 on the left side.       Posterior tibial pulses are 0 on the right side and 0 on the left side.  Pulmonary/Chest: Effort normal and breath sounds normal.  Abdominal: Soft. Bowel sounds are normal.  Musculoskeletal: Normal range of motion.        General: No edema.  Neurological: She is alert.  Skin: Skin is warm and dry.  Psychiatric: She has a normal mood and affect.   Radiology: No results found.  Laboratory Examination:  CMP Latest Ref Rng & Units 01/23/2019 01/22/2019 01/21/2019  Glucose 70 - 99 mg/dL 116(H) 152(H) 106(H)  BUN 8 - 23 mg/dL 18 15 14   Creatinine 0.44 - 1.00 mg/dL 1.17(H) 1.15(H) 1.17(H)  Sodium 135 - 145 mmol/L 139 141 140  Potassium 3.5 - 5.1 mmol/L 3.6 3.7 3.1(L)  Chloride 98 - 111 mmol/L 102 105 110  CO2 22 - 32 mmol/L 27 27 22   Calcium 8.9 - 10.3 mg/dL 9.0 9.0 8.9  Total Protein 6.5 - 8.1 g/dL - - -  Total Bilirubin 0.3 - 1.2 mg/dL - - -  Alkaline Phos 38 - 126 U/L - - -  AST 15 - 41 U/L - - -  ALT 0 - 44 U/L - - -   CBC Latest Ref Rng & Units 01/21/2019 01/20/2019  10/10/2018  WBC 4.0 - 10.5 K/uL 4.0 4.5 4.8  Hemoglobin 12.0 - 15.0 g/dL 11.1(L) 11.1(L) 10.5(L)  Hematocrit 36.0 - 46.0 % 34.3(L) 34.2(L) 31.8(L)  Platelets 150 - 400 K/uL 112(L) 106(L) 102(L)   Lipid Panel     Component Value Date/Time   CHOL 178 03/25/2015 0325   TRIG 116 03/25/2015 0325   HDL 46 03/25/2015 0325   CHOLHDL 3.9 03/25/2015 0325   VLDL 23 03/25/2015 0325   LDLCALC 109 (H) 03/25/2015 0325   LDLDIRECT 90 04/08/2012 1414   HEMOGLOBIN A1C Lab Results  Component Value Date   HGBA1C 7.8 (H) 01/20/2019   MPG 177.16 01/20/2019   TSH Recent Labs    01/20/19 2148  TSH 1.371    CARDIAC STUDIES:   Echocardiogram 01/20/2019: Left Ventricle: The left ventricle has severely reduced systolic function, with an ejection fraction of 25-30%. The cavity size was moderately dilated. There is no increase in left ventricular wall thickness. Left ventricular diastolic Doppler parameters are consistent with restrictive filling. Elevated left ventricular end-diastolic pressure Left ventrical global hypokinesis without regional wall motion abnormalities. Right  Ventricle: The right ventricle has normal systolic function. The cavity was normal. There is no increase in right ventricular wall thickness. Left Atrium: left atrial size was moderately dilated Right Atrium: right atrial size was moderately dilated. Right atrial pressure is estimated at 15 mmHg. Interatrial Septum: The interatrial septum was not well visualized. Pericardium: There is no evidence of pericardial effusion. Mitral Valve: The mitral valve is normal in structure. Mild thickening of the mitral valve leaflet. Mild calcification of the mitral valve leaflet. Mitral valve regurgitation is moderate to severe by color flow Doppler. Tricuspid Valve: The tricuspid valve is normal in structure. Tricuspid valve regurgitation is mild by color flow Doppler. Aortic Valve: The aortic valve is tricuspid Mild thickening of the aortic valve  Mild calcification of the aortic valve. Aortic valve regurgitation is mild to moderate by color flow Doppler. Pulmonic Valve: The pulmonic valve was grossly normal. Pulmonic valve regurgitation is mild by color flow Doppler.  Carotid artery duplex 12/02/2018: No hemodynamically significant arterial disease in the internal carotid artery bilaterally. Minimal plaque noted bilateral carotid arteries. Antegrade right vertebral artery flow. Antegrade left vertebral artery flow.  Coronary angiogram 02/08/2019:  Distal left main 20% diffuse disease.  Ostial LAD stent shows diffuse 20% in-stent restenosis (3.0 x 15 mm resolute Onyx on 12/29/2017).  Mid LAD and mid to distal LAD there are tandem lesions 80% and 95%.  Ramus intermediate ostial 95 to 99% stenosis.  Previously placed RI stent widely patent ( 2.0 x 12 mm resolute on 04/26/2018).  30 to 40% tandem lesions in the RCA. Cutting Balloon angioplasty of the ramus intermediate ostial high-grade stenosis, 99% reduced to 0% and Cutting Balloon angioplasty of the mid LAD followed by stenting with 2.5 x 32 mm Synergy DES for high-grade 90% stenosis reduced to 0% and TIMI-3 to TIMI-3 flow maintained in both lesions.  Assessment:    Coronary artery disease of native artery of native heart with stable angina pectoris ([place]) - Plan: clopidogrel (PLAVIX) 75 MG tablet, PCV ECHOCARDIOGRAM COMPLETE  Dyspnea on exertion  Chronic combined systolic and diastolic CHF (congestive heart failure) ([facility]) - Plan: torsemide (DEMADEX) 20 MG tablet, potassium chloride SA (KLOR-CON M20) 20 MEQ tablet, PCV ECHOCARDIOGRAM COMPLETE  EKG 02/08/22: Sinus bradycardia at rate of 55 bpm, left bundle branch block.  No further analysis.  No significant change from prior EKG 02/08/2019.  Recommendation:  Patient's symptoms of angina and dyspnea has improved since angioplasty to her LAD for high-grade disease and also ramus intermediate.  Suspect she probably will be restenosis in the RI  vessel as the ostial lesion was missed with regard to stenting but if she remains asymptomatic would continue medical therapy for the same due to diffuse disease.  I have again discussed with her the importance of maintaining weight loss, watching salt and fluid intake, regular exercise.  There is no leg edema, no clinical evidence of heart failure, no changes in the medications were done today.  I'll like to see her back in 3 months with repeat echocardiogram to reevaluate LV systolic function. She is presently not using O2 supplementation since PTCA and may need to cancel orders for O2.   Adrian Prows, MD, [facility] 02/17/2019, 4:43 PM [facility] [place] Pager: [phone] Office: [phone] If no answer Cell [phone]

## 2019-02-20 ENCOUNTER — Encounter: Payer: Self-pay | Admitting: Cardiology

## 2019-03-01 ENCOUNTER — Other Ambulatory Visit: Payer: Self-pay | Admitting: Cardiology

## 2019-03-01 MED ORDER — LOSARTAN POTASSIUM 25 MG PO TABS: 25.0000 mg | ORAL_TABLET | Freq: Every day | ORAL | 3 refills | Status: DC

## 2019-03-02 ENCOUNTER — Other Ambulatory Visit: Payer: Self-pay | Admitting: Cardiology

## 2019-03-02 DIAGNOSIS — I255 Ischemic cardiomyopathy: Secondary | ICD-10-CM

## 2019-03-02 MED ORDER — LOSARTAN POTASSIUM 25 MG PO TABS: 25.0000 mg | ORAL_TABLET | Freq: Every day | ORAL | 1 refills | Status: DC

## 2019-05-05 ENCOUNTER — Telehealth: Payer: Self-pay

## 2019-05-05 NOTE — Telephone Encounter (Signed)
Pt called stating that she is having lower extremity swelling and very weak/fatigued. She is taking Torsemide and Spironolactone as directed. She has an echo scheduled on 7/2. Please advise.//ah

## 2019-05-06 NOTE — Telephone Encounter (Signed)
Double Torsemide for 3 days only. Watch diet, cannot miss one day of diet being strict. She has to put her son in facility

## 2019-05-06 NOTE — Telephone Encounter (Signed)
Pt aware of instructions per JG.//ah

## 2019-05-12 DIAGNOSIS — N183 Chronic kidney disease, stage 3 unspecified: Secondary | ICD-10-CM | POA: Insufficient documentation

## 2019-05-19 ENCOUNTER — Other Ambulatory Visit: Payer: Self-pay

## 2019-05-19 ENCOUNTER — Ambulatory Visit (INDEPENDENT_AMBULATORY_CARE_PROVIDER_SITE_OTHER): Payer: Medicare Other

## 2019-05-19 DIAGNOSIS — I25118 Atherosclerotic heart disease of native coronary artery with other forms of angina pectoris: Secondary | ICD-10-CM

## 2019-05-19 DIAGNOSIS — I5042 Chronic combined systolic (congestive) and diastolic (congestive) heart failure: Secondary | ICD-10-CM

## 2019-05-24 ENCOUNTER — Other Ambulatory Visit: Payer: Self-pay

## 2019-05-24 DIAGNOSIS — I5042 Chronic combined systolic (congestive) and diastolic (congestive) heart failure: Secondary | ICD-10-CM

## 2019-05-24 MED ORDER — TORSEMIDE 20 MG PO TABS: 20.0000 mg | ORAL_TABLET | ORAL | 3 refills | Status: DC

## 2019-05-26 ENCOUNTER — Encounter: Payer: Self-pay | Admitting: Cardiology

## 2019-05-26 ENCOUNTER — Other Ambulatory Visit: Payer: Self-pay

## 2019-05-26 ENCOUNTER — Ambulatory Visit (INDEPENDENT_AMBULATORY_CARE_PROVIDER_SITE_OTHER): Payer: Medicare Other | Admitting: Cardiology

## 2019-05-26 VITALS — BP 134/74 | Ht 64.0 in | Wt 169.0 lb

## 2019-05-26 DIAGNOSIS — N183 Chronic kidney disease, stage 3 (moderate): Secondary | ICD-10-CM

## 2019-05-26 DIAGNOSIS — I447 Left bundle-branch block, unspecified: Secondary | ICD-10-CM

## 2019-05-26 DIAGNOSIS — I5042 Chronic combined systolic (congestive) and diastolic (congestive) heart failure: Secondary | ICD-10-CM | POA: Diagnosis not present

## 2019-05-26 DIAGNOSIS — I255 Ischemic cardiomyopathy: Secondary | ICD-10-CM | POA: Diagnosis not present

## 2019-05-26 DIAGNOSIS — I25118 Atherosclerotic heart disease of native coronary artery with other forms of angina pectoris: Secondary | ICD-10-CM | POA: Diagnosis not present

## 2019-05-26 DIAGNOSIS — E1122 Type 2 diabetes mellitus with diabetic chronic kidney disease: Secondary | ICD-10-CM

## 2019-05-26 NOTE — Progress Notes (Signed)
Primary Physician:  [name], PA-C   Patient ID: Tito Dine, female    DOB: 1941/08/26, 78 y.o.   MRN: [id number]  Subjective:    Chief Complaint  Patient presents with  . Coronary Artery Disease  . Follow-up    after testing   This visit type was conducted due to national recommendations for restrictions regarding the COVID-19 Pandemic (e.g. social distancing).  This format is felt to be most appropriate for this patient at this time.  All issues noted in this document were discussed and addressed.  No physical exam was performed (except for noted visual exam findings with Telehealth visits).  The patient has consented to conduct a Telehealth visit and understands insurance will be billed.   I discussed the limitations of evaluation and management by telemedicine and the availability of in person appointments. The patient expressed understanding and agreed to proceed.  Virtual Visit via Video Note is as below  I connected with Mrs. Mofitt, on 05/26/19 at 1330 by telephone and verified that I am speaking with the correct person using two identifiers. Unable to perform video visit as patient did not have equipment.    I have discussed with the patient regarding the safety during COVID Pandemic and steps and precautions including social distancing with the patient.    HPI: TOMEKA KANTNER  is a 78 y.o. female  with hypertension, uncontrolled type 2 DM, CAD s/p prior LAD and ramus PCI, HFpEF, h/o Rt leg cellulitis and metatarsal fracture, managed by Podiatry. Had admission to hospital for CHF on 01/23/2019 due to marked dyspnea and discharged on 2L [place] O2. Echo revealed new decrease in LVEF and underwent coronary angiogram on 01/27/19 and PCI to LAD (new stent) and balloon PTCA to IST RI lesion.   This is a 3 month office visit. She underwent echocardiogram on 05/19/19 that continues to reveal depressed LVEF of 20-25%. Continues to notice marked improvement in dyspnea, no PND  or orthopnea, she has not had recurrence of chest pain.  Feels well. Still gets tired when she does things, but not like before. Reports recently sustained injury to coccyx due to falling out of a chair and now her spinal cord stimulator is not working, but working to have this fixed.  Past Medical History:  Diagnosis Date  . Acute combined systolic and diastolic heart failure ([place]) 04/25/2018  . Anxiety   . Arthritis    "hands" (12/29/2017)  . Basal cell carcinoma of nose    removed  . Bursitis of left shoulder   . Cat scratch fever    Late 90s  . Coronary artery disease   . Depression   . Diabetes mellitus, type II, insulin dependent ([place])   . GERD (gastroesophageal reflux disease)   . H. pylori infection 2008 and 1998   treated  . Hyperlipidemia   . Hypertension   . Hypothyroidism   . Low oxygen saturation   . Multinodular goiter   . Rotator cuff tear, left recurrent   . Urge urinary incontinence   . UTI (lower urinary tract infection) 05/2016    Past Surgical History:  Procedure Laterality Date  . APPENDECTOMY  AGE 44  . BACK SURGERY    . BASAL CELL CARCINOMA EXCISION     "nose"  . BLADDER NECK SUSPENSION  1970's  . BUNIONECTOMY WITH HAMMERTOE RECONSTRUCTION Bilateral   . CARPAL TUNNEL RELEASE Right 05/2017  . COLONOSCOPY W/ POLYPECTOMY    . CORONARY ANGIOPLASTY WITH STENT PLACEMENT  12/29/2017  . CORONARY BALLOON ANGIOPLASTY N/A 02/08/2019   Procedure: CORONARY BALLOON ANGIOPLASTY;  Surgeon: Adrian Prows, MD;  Location: [facility];  Service: Cardiovascular;  Laterality: N/A;  . CORONARY STENT INTERVENTION N/A 12/29/2017   Procedure: CORONARY STENT INTERVENTION;  Surgeon: Nigel Mormon, MD;  Location: [facility];  Service: Cardiovascular;  Laterality: N/A;  . CORONARY STENT INTERVENTION N/A 04/26/2018   Procedure: CORONARY STENT INTERVENTION;  Surgeon: Nigel Mormon, MD;  Location: [facility];  Service: Cardiovascular;  Laterality: N/A;   . CORONARY STENT INTERVENTION N/A 02/08/2019   Procedure: CORONARY STENT INTERVENTION;  Surgeon: Adrian Prows, MD;  Location: [facility];  Service: Cardiovascular;  Laterality: N/A;  . ESOPHAGOGASTRODUODENOSCOPY ENDOSCOPY    . FORAMINAL DECOMPRESSION AT L2 TO THE SACRUM  01-05-2008   L2  -  S1  . GANGLION CYST EXCISION Left 01/17/2009   ring finger  . IMPLANTATION PERMANENT SPINAL CORD STIMULATOR  06-15-2008   JUNE 2013--  BATTERY CHANGE  . JOINT REPLACEMENT    . LEFT HEART CATH AND CORONARY ANGIOGRAPHY N/A 04/26/2018   Procedure: LEFT HEART CATH AND CORONARY ANGIOGRAPHY;  Surgeon: Nigel Mormon, MD;  Location: [facility];  Service: Cardiovascular;  Laterality: N/A;  . LEFT HEART CATH AND CORONARY ANGIOGRAPHY N/A 02/08/2019   Procedure: LEFT HEART CATH AND CORONARY ANGIOGRAPHY;  Surgeon: Adrian Prows, MD;  Location: [facility];  Service: Cardiovascular;  Laterality: N/A;  . LIPOMA EXCISION Right    RIGHT ELBOW  . METATARSAL HEAD EXCISION Right 06/16/2018   Procedure: right 5th metatarsal excision, a mini c-arm;  Surgeon: Trula Slade, DPM;  Location: [facility];  Service: Podiatry;  Laterality: Right;  anesthesia can do block  . RIGHT/LEFT HEART CATH AND CORONARY ANGIOGRAPHY N/A 12/29/2017   Procedure: RIGHT/LEFT HEART CATH AND CORONARY ANGIOGRAPHY;  Surgeon: Nigel Mormon, MD;  Location: [facility];  Service: Cardiovascular;  Laterality: N/A;  . SHOULDER OPEN ROTATOR CUFF REPAIR  09/07/2012   Procedure: ROTATOR CUFF REPAIR SHOULDER OPEN;  Surgeon: Magnus Sinning, MD;  Location: [facility];  Service: Orthopedics;  Laterality: Left;  OPEN ANTERIOR ACROMIONECTOMY AND ROTATOR CUFF REPAIR ON LEFT   . SHOULDER OPEN ROTATOR CUFF REPAIR Left 12/28/2012   Procedure: ROTATOR CUFF REPAIR SHOULDER OPEN;  Surgeon: Magnus Sinning, MD;  Location: [facility];  Service: Orthopedics;  Laterality: Left;  OPEN SHOULDER ROTATOR CUFF  REPAIR ON LEFT  WITH ANTERIOR ACROMINECTOMY   . SHOULDER OPEN ROTATOR CUFF REPAIR Right 03/28/2003   RIGHT SHOULDER  DEGENERATIVE AC JOINT AND RC TEAR  . SPINAL CORD STIMULATOR BATTERY EXCHANGE N/A 07/03/2016   Procedure: SPINAL CORD STIMULATOR BATTERY PLACMENT;  Surgeon: Melina Schools, MD;  Location: [facility];  Service: Orthopedics;  Laterality: N/A;  . TONSILLECTOMY  AGE 41  . 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 VAGINAL HYSTERECTOMY  1970's    Social History   Socioeconomic History  . Marital status: Married    Spouse name: Not on file  . Number of children: 2  . Years of education: 11th  . Highest education level: Not on file  Occupational History  . Occupation: Retired  Scientific laboratory technician  . Financial resource strain: Not on file  . Food insecurity    Worry: Not on file    Inability: Not on file  . Transportation needs    Medical: Not on file  Non-medical: Not on file  Tobacco Use  . Smoking status: Never Smoker  . Smokeless tobacco: Never Used  Substance and Sexual Activity  . Alcohol use: No  . Drug use: No  . Sexual activity: Yes  Lifestyle  . Physical activity    Days per week: Not on file    Minutes per session: Not on file  . Stress: Not on file  Relationships  . Social Herbalist on phone: Not on file    Gets together: Not on file    Attends religious service: Not on file    Active member of club or organization: Not on file    Attends meetings of clubs or organizations: Not on file    Relationship status: Not on file  . Intimate partner violence    Fear of current or ex partner: Not on file    Emotionally abused: Not on file    Physically abused: Not on file    Forced sexual activity: Not on file  Other Topics Concern  . Not on file  Social History Narrative   Pt lives with her husband and 1 grown son in [place]. Younger son has muscular dystrophy. Having a difficult time as primary caregiver to son who is not doing well.   Declined respite care/placement.   Caffeine Use: none; very little    Review of Systems  Constitution: Negative for chills, decreased appetite, malaise/fatigue and weight gain.  Cardiovascular: Positive for dyspnea on exertion (mild and improved). Negative for leg swelling and syncope.  Endocrine: Negative for cold intolerance.  Hematologic/Lymphatic: Does not bruise/bleed easily.  Musculoskeletal: Negative for joint swelling.  Gastrointestinal: Negative for abdominal pain, anorexia and change in bowel habit.  Neurological: Negative for headaches and light-headedness.  Psychiatric/Behavioral: Negative for depression and substance abuse.  All other systems reviewed and are negative.     Objective:  Blood pressure 134/74, height 5' 4"  (1.626 m), weight 169 lb (76.7 kg). Body mass index is 29.01 kg/m.    Physical exam not performed or limited due to virtual visit.    Please see exam details from prior visit is as below.   Physical Exam  Constitutional: She appears well-developed and well-nourished. No distress.  HENT:  Head: Atraumatic.  Eyes: Conjunctivae are normal.  Neck: Neck supple. No JVD present. No thyromegaly present.  Cardiovascular: Normal rate, regular rhythm, normal heart sounds and intact distal pulses. Exam reveals no gallop.  No murmur heard. Pulses:      Carotid pulses are 2+ on the right side with bruit and 2+ on the left side.      Femoral pulses are 2+ on the right side and 2+ on the left side.      Popliteal pulses are 0 on the right side and 0 on the left side.       Dorsalis pedis pulses are 0 on the right side and 0 on the left side.       Posterior tibial pulses are 0 on the right side and 0 on the left side.  Pulmonary/Chest: Effort normal and breath sounds normal.  Abdominal: Soft. Bowel sounds are normal.  Musculoskeletal: Normal range of motion.        General: No edema.  Neurological: She is alert.  Skin: Skin is warm and dry.  Psychiatric: She  has a normal mood and affect.   Radiology: No results found.  Laboratory examination:   04/22/2019: Creatinine 1.5, eGFR 33/38, potassium 4.6, CMP otherwise normal. HgbA1c  8.2%   07/08/2018: Cholesterol 166, triglycerides 137, HDL 55, LDL 84.  CMP Latest Ref Rng & Units 01/23/2019 01/22/2019 01/21/2019  Glucose 70 - 99 mg/dL 116(H) 152(H) 106(H)  BUN 8 - 23 mg/dL 18 15 14   Creatinine 0.44 - 1.00 mg/dL 1.17(H) 1.15(H) 1.17(H)  Sodium 135 - 145 mmol/L 139 141 140  Potassium 3.5 - 5.1 mmol/L 3.6 3.7 3.1(L)  Chloride 98 - 111 mmol/L 102 105 110  CO2 22 - 32 mmol/L 27 27 22   Calcium 8.9 - 10.3 mg/dL 9.0 9.0 8.9  Total Protein 6.5 - 8.1 g/dL - - -  Total Bilirubin 0.3 - 1.2 mg/dL - - -  Alkaline Phos 38 - 126 U/L - - -  AST 15 - 41 U/L - - -  ALT 0 - 44 U/L - - -   CBC Latest Ref Rng & Units 01/21/2019 01/20/2019 10/10/2018  WBC 4.0 - 10.5 K/uL 4.0 4.5 4.8  Hemoglobin 12.0 - 15.0 g/dL 11.1(L) 11.1(L) 10.5(L)  Hematocrit 36.0 - 46.0 % 34.3(L) 34.2(L) 31.8(L)  Platelets 150 - 400 K/uL 112(L) 106(L) 102(L)   Lipid Panel     Component Value Date/Time   CHOL 178 03/25/2015 0325   TRIG 116 03/25/2015 0325   HDL 46 03/25/2015 0325   CHOLHDL 3.9 03/25/2015 0325   VLDL 23 03/25/2015 0325   LDLCALC 109 (H) 03/25/2015 0325   LDLDIRECT 90 04/08/2012 1414   HEMOGLOBIN A1C Lab Results  Component Value Date   HGBA1C 7.8 (H) 01/20/2019   MPG 177.16 01/20/2019   TSH Recent Labs    01/20/19 2148  TSH 1.371    PRN Meds:. Medications Discontinued During This Encounter  Medication Reason  . acetaminophen (TYLENOL) 500 MG tablet Error   Current Meds  Medication Sig  . aspirin EC 81 MG tablet Take 81 mg by mouth daily.  . busPIRone (BUSPAR) 7.5 MG tablet Take 7.5 mg by mouth 2 (two) times daily.  . Calcium Carb-Cholecalciferol (CALCIUM 600 + D PO) Take 1 tablet by mouth daily.   . clopidogrel (PLAVIX) 75 MG tablet Take 1 tablet (75 mg total) by mouth daily.  Marland Kitchen ezetimibe (ZETIA) 10 MG  tablet Take 1 tablet (10 mg total) by mouth daily.  Marland Kitchen FLUoxetine (PROZAC) 20 MG capsule Take 20 mg by mouth every evening.   Marland Kitchen glucose blood (ONETOUCH VERIO) test strip 1 each 3 (three) times daily.   . insulin lispro (HUMALOG) 100 UNIT/ML injection Inject 2-6 Units into the skin See admin instructions. For use with V - GO 20 Insulin Delivery Device: inject 6 units subcutaneously three times daily before meals, inject 2 units at bedtime if eating a bedtime snack  . Investigational - Study Medication Take 1 tablet by mouth daily. Study name: dapagliflozin/metformin ER 10/1000mg Additional study details: This is a Drug Study medication from Dr. Nadyne Coombes at [facility]. Patient started taking this medication on 04/22/18 and is unsure of how long she is to take this medication for. Per patient, she is on this medication as part of a Heart and Diabetes drug study.  . isosorbide mononitrate (IMDUR) 60 MG 24 hr tablet Take 60 mg by mouth daily.  Marland Kitchen labetalol (NORMODYNE) 100 MG tablet Take 100 mg by mouth 2 (two) times daily.  Marland Kitchen levothyroxine (SYNTHROID, LEVOTHROID) 50 MCG tablet Take 50 mcg by mouth daily before breakfast.   . losartan (COZAAR) 25 MG tablet Take 1 tablet (25 mg total) by mouth daily.  . nitroGLYCERIN (NITROSTAT) 0.4 MG SL tablet  Place 1 tablet (0.4 mg total) under the tongue every 5 (five) minutes x 3 doses as needed for chest pain.  Glory Rosebush VERIO test strip 1 each 3 (three) times daily.   . pantoprazole (PROTONIX) 40 MG tablet Take 1 tablet (40 mg total) by mouth 2 (two) times daily. (Patient taking differently: Take 40 mg by mouth daily as needed (acid reflux). )  . potassium chloride SA (KLOR-CON M20) 20 MEQ tablet Take 1 tablet (20 mEq total) by mouth every other day. With Torsemide as directed for leg swelling  . rosuvastatin (CRESTOR) 40 MG tablet Take 1 tablet (40 mg total) by mouth at bedtime.  Marland Kitchen spironolactone (ALDACTONE) 25 MG tablet Take 1 tablet (25 mg total) by mouth See  admin instructions. Take 1 tablet (25 mg) by mouth in the morning, may repeat dose in the afternoon/evening if needed for fluid or swelling.  . torsemide (DEMADEX) 20 MG tablet Take 1 tablet (20 mg total) by mouth every other day. Can increase to  Daily if leg swelling  . vitamin B-12 (CYANOCOBALAMIN) 1000 MCG tablet Take 1,000 mcg by mouth daily.   . Vitamin D, Ergocalciferol, (DRISDOL) 50000 units CAPS capsule Take 50,000 Units by mouth every Thursday.     Cardiac Studies:   Echocardiogram 05/20/2019: Left ventricle cavity is moderately dilated.  Severely depressed LV systolic function with global hypokinesis. LVEF 20-25%. Cannot exclude LV apical thrombus. Moderate concentric hypertrophy of the left ventricle. Normal global wall motion. Doppler evidence of grade II (pseudonormal) diastolic dysfunction, elevated LAP.  Left atrial cavity is severely dilated. Right atrial cavity is mildly dilated. Moderate (Grade II) aortic regurgitation. Moderate (Grade III) mitral regurgitation. Moderate tricuspid regurgitation. Estimated pulmonary artery systolic pressure is 65 mmHg. Small pericardial effusion.  No significant change compared to previous study on 01/21/2019.   Carotid artery duplex 12/02/2018: No hemodynamically significant arterial disease in the internal carotid artery bilaterally. Minimal plaque noted bilateral carotid arteries. Antegrade right vertebral artery flow. Antegrade left vertebral artery flow.  Coronary angiogram 02/08/2019: Distal left main 20% diffuse disease. Ostial LAD stent shows diffuse 20% in-stent restenosis (3.0 x 15 mm resolute Onyx on 12/29/2017). Mid LAD and mid to distal LAD there are tandem lesions 80% and 95%. Ramus intermediate ostial 95 to 99% stenosis. Previously placed RI stent widely patent ( 2.0 x 12 mm resolute on 04/26/2018). 30 to 40% tandem lesions in the RCA. Cutting Balloon angioplasty of the ramus intermediate ostial high-grade stenosis, 99%  reduced to 0% and Cutting Balloon angioplasty of the mid LAD followed by stenting with 2.5 x 32 mm Synergy DES for high-grade 90% stenosis reduced to 0% and TIMI-3 to TIMI-3 flow maintained in both lesions.  Assessment:     ICD-10-CM   1. Coronary artery disease of native artery of native heart with stable angina pectoris ([place])  I25.118   2. Chronic combined systolic and diastolic CHF (congestive heart failure) ([facility])  I50.42   3. Ischemic cardiomyopathy  I25.5   4. CKD stage 3 due to type 2 diabetes mellitus ([facility])  E11.22    N18.3   5. Left bundle branch block  I44.7     EKG 02/08/22: Sinus bradycardia at rate of 55 bpm, left bundle branch block.  No further analysis.  No significant change from prior EKG 02/08/2019.  Recommendations:   I have discussed recently obtained echocardiogram results, continues to have ischemic cardiomyopathy with LVEF of 20-25% despite intervention. Patient's symptoms of angina and dyspnea have continued to be stable  and improved since angioplasty to her LAD for high-grade disease and also ramus intermediate.  Suspect she probably will have restenosis in the RI vessel as the ostial lesion was missed with regard to stenting but if she remains asymptomatic would continue medical therapy for the same due to diffuse disease.   Symptomatically she seems to be doing fairly well; however, is at risk for deterioration especially in view of her multiple co-morbidities. She is at risk for sudden cardiac death due to ischemic cardiomyopathy. I will place referral to EP for evaluation and possible ICD implantation. She does have LBBB and could potentially be candidate for CRT. She is currently class 2 NYHA CHF. Leg swelling and weight has been stable. She is being careful with her diet.  Recently, she has had slight worsening in kidney function. I have not changed to Entresto at this time because of this, will continue with Losartan. She will also likely continue to need daily  dose of Torsemide due to heart failure. She will see PCP in a few weeks and will have labs repeated. She will continue to follow up with Dr. Hartford Poli for management of diabetes. I will see her back in 3 months for follow up.    *I have discussed this case with [name] and he participated in formulating the plan.*   Miquel Dunn, MSN, APRN, FNP-C [facility]. [place] Office: [phone] Fax: [phone]

## 2019-05-29 ENCOUNTER — Encounter: Payer: Self-pay | Admitting: Cardiology

## 2019-06-08 DIAGNOSIS — I5042 Chronic combined systolic (congestive) and diastolic (congestive) heart failure: Secondary | ICD-10-CM | POA: Insufficient documentation

## 2019-06-08 HISTORY — DX: Chronic combined systolic (congestive) and diastolic (congestive) heart failure: I50.42

## 2019-06-16 ENCOUNTER — Telehealth: Payer: Self-pay

## 2019-06-16 NOTE — Telephone Encounter (Signed)

## 2019-06-17 ENCOUNTER — Other Ambulatory Visit: Payer: Self-pay

## 2019-06-17 ENCOUNTER — Ambulatory Visit (INDEPENDENT_AMBULATORY_CARE_PROVIDER_SITE_OTHER): Payer: Medicare Other | Admitting: Internal Medicine

## 2019-06-17 ENCOUNTER — Other Ambulatory Visit (HOSPITAL_COMMUNITY)
Admission: RE | Admit: 2019-06-17 | Discharge: 2019-06-17 | Disposition: A | Discharge status: Home / Self Care | Payer: Medicare Other | Source: Ambulatory Visit | Attending: Internal Medicine | Admitting: Internal Medicine

## 2019-06-17 ENCOUNTER — Encounter: Payer: Self-pay | Admitting: Internal Medicine

## 2019-06-17 DIAGNOSIS — Z01812 Encounter for preprocedural laboratory examination: Secondary | ICD-10-CM | POA: Diagnosis present

## 2019-06-17 DIAGNOSIS — Z20828 Contact with and (suspected) exposure to other viral communicable diseases: Secondary | ICD-10-CM | POA: Insufficient documentation

## 2019-06-17 DIAGNOSIS — I447 Left bundle-branch block, unspecified: Secondary | ICD-10-CM | POA: Insufficient documentation

## 2019-06-17 DIAGNOSIS — I428 Other cardiomyopathies: Secondary | ICD-10-CM | POA: Insufficient documentation

## 2019-06-17 NOTE — Progress Notes (Signed)
HPI Sara Graves is referred by [name] for evaluation and consideration for ICD insertion. She is a pleasant 78 yo woman with a h/o an ICM, s/p MI, s/p multiple coronary stents with LBBB and worsening LV dysfunction despite PCI. She does not currently have angina. She underwent PCI in March. Repeat echo a couple of weeks ago demonstrated an EF of 25%. She has not had syncope. She has LBBB on her QRS.  Allergies  Allergen Reactions  . Rosiglitazone Maleate Anaphylaxis and Swelling  . Morphine Other (See Comments)    SEVERE HYPOTENSION   . Cephalexin Diarrhea  . Sulfa Antibiotics Diarrhea and Nausea And Vomiting  . Tramadol Other (See Comments)    Unknown reaction  . Elavil [Amitriptyline] Nausea Only  . Tramadol-Acetaminophen Rash     Current Outpatient Medications  Medication Sig Dispense Refill  . aspirin EC 81 MG tablet Take 81 mg by mouth daily.    . busPIRone (BUSPAR) 7.5 MG tablet Take 7.5 mg by mouth 2 (two) times daily.    . Calcium Carb-Cholecalciferol (CALCIUM 600 + D PO) Take 1 tablet by mouth daily.     . clopidogrel (PLAVIX) 75 MG tablet Take 1 tablet (75 mg total) by mouth daily. 90 tablet 1  . FLUoxetine (PROZAC) 10 MG capsule Take 1 capsule by mouth as needed for anxiety.    Marland Kitchen FLUoxetine (PROZAC) 20 MG capsule Take 20 mg by mouth every evening.   1  . glucose blood (ONETOUCH VERIO) test strip 1 each 3 (three) times daily.     . insulin lispro (HUMALOG) 100 UNIT/ML injection Inject 2-6 Units into the skin See admin instructions. For use with V - GO 20 Insulin Delivery Device: inject 6 units subcutaneously three times daily before meals, inject 2 units at bedtime if eating a bedtime snack    . Investigational - Study Medication Take 1 tablet by mouth daily. Study name: dapagliflozin/metformin ER 10/1000mg  Additional study details: This is a Drug Study medication from Dr. Nadyne Coombes at [facility]. Patient started taking this medication on 04/22/18 and is  unsure of how long she is to take this medication for. Per patient, she is on this medication as part of a Heart and Diabetes drug study. 1 each PRN  . isosorbide mononitrate (IMDUR) 60 MG 24 hr tablet Take 60 mg by mouth daily.    Marland Kitchen labetalol (NORMODYNE) 100 MG tablet Take 100 mg by mouth 2 (two) times daily.    Marland Kitchen levothyroxine (SYNTHROID, LEVOTHROID) 50 MCG tablet Take 50 mcg by mouth daily before breakfast.     . losartan (COZAAR) 25 MG tablet Take 1 tablet (25 mg total) by mouth daily. 90 tablet 1  . metoCLOPramide (REGLAN) 5 MG tablet Take 1 tablet by mouth 3 (three) times daily.    . nitroGLYCERIN (NITROSTAT) 0.4 MG SL tablet Place 1 tablet (0.4 mg total) under the tongue every 5 (five) minutes x 3 doses as needed for chest pain. 25 tablet 2  . ONETOUCH VERIO test strip 1 each 3 (three) times daily.   5  . pantoprazole (PROTONIX) 40 MG tablet Take 1 tablet (40 mg total) by mouth 2 (two) times daily. (Patient taking differently: Take 40 mg by mouth daily as needed (acid reflux). ) 60 tablet 1  . torsemide (DEMADEX) 20 MG tablet Take 1 tablet (20 mg total) by mouth every other day. Can increase to  Daily if leg swelling 60 tablet 3  . vitamin B-12 (CYANOCOBALAMIN)  1000 MCG tablet Take 1,000 mcg by mouth daily.     . Vitamin D, Ergocalciferol, (DRISDOL) 50000 units CAPS capsule Take 50,000 Units by mouth every Thursday.      No current facility-administered medications for this visit.      Past Medical History:  Diagnosis Date  . Acute combined systolic and diastolic heart failure ([place]) 04/25/2018  . Anxiety   . Arthritis    "hands" (12/29/2017)  . Basal cell carcinoma of nose    removed  . Bursitis of left shoulder   . Cat scratch fever    Late 90s  . Coronary artery disease   . Depression   . Diabetes mellitus, type II, insulin dependent ([place])   . GERD (gastroesophageal reflux disease)   . H. pylori infection 2008 and 1998   treated  . Hyperlipidemia   . Hypertension   .  Hypothyroidism   . Low oxygen saturation   . Multinodular goiter   . Rotator cuff tear, left recurrent   . Urge urinary incontinence   . UTI (lower urinary tract infection) 05/2016    ROS:   All systems reviewed and negative except as noted in the HPI.   Past Surgical History:  Procedure Laterality Date  . APPENDECTOMY  AGE 65  . BACK SURGERY    . BASAL CELL CARCINOMA EXCISION     "nose"  . BLADDER NECK SUSPENSION  1970's  . BUNIONECTOMY WITH HAMMERTOE RECONSTRUCTION Bilateral   . CARPAL TUNNEL RELEASE Right 05/2017  . COLONOSCOPY W/ POLYPECTOMY    . CORONARY ANGIOPLASTY WITH STENT PLACEMENT  12/29/2017  . CORONARY BALLOON ANGIOPLASTY N/A 02/08/2019   Procedure: CORONARY BALLOON ANGIOPLASTY;  Surgeon: Adrian Prows, MD;  Location: [place] [facility];  Service: Cardiovascular;  Laterality: N/A;  . CORONARY STENT INTERVENTION N/A 12/29/2017   Procedure: CORONARY STENT INTERVENTION;  Surgeon: Nigel Mormon, MD;  Location: [place] [facility];  Service: Cardiovascular;  Laterality: N/A;  . CORONARY STENT INTERVENTION N/A 04/26/2018   Procedure: CORONARY STENT INTERVENTION;  Surgeon: Nigel Mormon, MD;  Location: [place] [facility];  Service: Cardiovascular;  Laterality: N/A;  . CORONARY STENT INTERVENTION N/A 02/08/2019   Procedure: CORONARY STENT INTERVENTION;  Surgeon: Adrian Prows, MD;  Location: [place] [facility];  Service: Cardiovascular;  Laterality: N/A;  . ESOPHAGOGASTRODUODENOSCOPY ENDOSCOPY    . FORAMINAL DECOMPRESSION AT L2 TO THE SACRUM  01-05-2008   L2  -  S1  . GANGLION CYST EXCISION Left 01/17/2009   ring finger  . IMPLANTATION PERMANENT SPINAL CORD STIMULATOR  06-15-2008   JUNE 2013--  BATTERY CHANGE  . JOINT REPLACEMENT    . LEFT HEART CATH AND CORONARY ANGIOGRAPHY N/A 04/26/2018   Procedure: LEFT HEART CATH AND CORONARY ANGIOGRAPHY;  Surgeon: Nigel Mormon, MD;  Location: [place] [facility];  Service: Cardiovascular;  Laterality: N/A;  . LEFT HEART  CATH AND CORONARY ANGIOGRAPHY N/A 02/08/2019   Procedure: LEFT HEART CATH AND CORONARY ANGIOGRAPHY;  Surgeon: Adrian Prows, MD;  Location: [place] [facility];  Service: Cardiovascular;  Laterality: N/A;  . LIPOMA EXCISION Right    RIGHT ELBOW  . METATARSAL HEAD EXCISION Right 06/16/2018   Procedure: right 5th metatarsal excision, a mini c-arm;  Surgeon: Trula Slade, DPM;  Location: [facility];  Service: Podiatry;  Laterality: Right;  anesthesia can do block  . RIGHT/LEFT HEART CATH AND CORONARY ANGIOGRAPHY N/A 12/29/2017   Procedure: RIGHT/LEFT HEART CATH AND CORONARY ANGIOGRAPHY;  Surgeon: Nigel Mormon, MD;  Location: [place] [facility];  Service: Cardiovascular;  Laterality: N/A;  . SHOULDER OPEN ROTATOR CUFF REPAIR  09/07/2012   Procedure: ROTATOR CUFF REPAIR SHOULDER OPEN;  Surgeon: Magnus Sinning, MD;  Location: [place];  Service: Orthopedics;  Laterality: Left;  OPEN ANTERIOR ACROMIONECTOMY AND ROTATOR CUFF REPAIR ON LEFT   . SHOULDER OPEN ROTATOR CUFF REPAIR Left 12/28/2012   Procedure: ROTATOR CUFF REPAIR SHOULDER OPEN;  Surgeon: Magnus Sinning, MD;  Location: [place];  Service: Orthopedics;  Laterality: Left;  OPEN SHOULDER ROTATOR CUFF REPAIR ON LEFT  WITH ANTERIOR ACROMINECTOMY   . SHOULDER OPEN ROTATOR CUFF REPAIR Right 03/28/2003   RIGHT SHOULDER  DEGENERATIVE AC JOINT AND RC TEAR  . SPINAL CORD STIMULATOR BATTERY EXCHANGE N/A 07/03/2016   Procedure: SPINAL CORD STIMULATOR BATTERY PLACMENT;  Surgeon: Melina Schools, MD;  Location: [place];  Service: Orthopedics;  Laterality: N/A;  . TONSILLECTOMY  AGE 41  . 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 VAGINAL HYSTERECTOMY  1970's     Family History  Problem Relation Age of Onset  . Cancer Mother        breast  . Heart Problems Mother   . Prostate cancer Father   . Muscular dystrophy Son   . Cancer Maternal Grandmother        breast  . Heart disease Maternal  Grandfather   . Breast cancer Sister      Social History   Socioeconomic History  . Marital status: Married    Spouse name: Not on file  . Number of children: 2  . Years of education: 11th  . Highest education level: Not on file  Occupational History  . Occupation: Retired  Scientific laboratory technician  . Financial resource strain: Not on file  . Food insecurity    Worry: Not on file    Inability: Not on file  . Transportation needs    Medical: Not on file    Non-medical: Not on file  Tobacco Use  . Smoking status: Never Smoker  . Smokeless tobacco: Never Used  Substance and Sexual Activity  . Alcohol use: No  . Drug use: No  . Sexual activity: Yes  Lifestyle  . Physical activity    Days per week: Not on file    Minutes per session: Not on file  . Stress: Not on file  Relationships  . Social Herbalist on phone: Not on file    Gets together: Not on file    Attends religious service: Not on file    Active member of club or organization: Not on file    Attends meetings of clubs or organizations: Not on file    Relationship status: Not on file  . Intimate partner violence    Fear of current or ex partner: Not on file    Emotionally abused: Not on file    Physically abused: Not on file    Forced sexual activity: Not on file  Other Topics Concern  . Not on file  Social History Narrative   Pt lives with her husband and 1 grown son in [place]. Younger son has muscular dystrophy. Having a difficult time as primary caregiver to son who is not doing well.  Declined respite care/placement.   Caffeine Use: none; very little     BP (!) 148/84   Pulse 96   Ht 5\' 4"  (1.626 m)   Wt 177 lb (80.3 kg)  SpO2 99%   BMI 30.38 kg/m   Physical Exam:  Well appearing 78 yo woman, NAD HEENT: Unremarkable Neck:  6 cm JVD, no thyromegally Lymphatics:  No adenopathy Back:  No CVA tenderness Lungs:  Clear with no wheezes HEART:  Regular rate rhythm, no murmurs, no rubs, no clicks  Abd:  soft, positive bowel sounds, no organomegally, no rebound, no guarding Ext:  2 plus pulses, no edema, no cyanosis, no clubbing Skin:  No rashes no nodules Neuro:  CN II through XII intact, motor grossly intact  EKG - nsr with lbbb   Assess/Plan: 1. ICM - she denies anginal symptoms. She will continue her current meds.  2. Chronic systolic heart failure - she has class 2 symptoms and an EF is 25%. She will undergo biv ICD insertion due to the above and LBBB.   Mikle Bosworth.D.

## 2019-06-17 NOTE — Patient Instructions (Addendum)
Medication Instructions:  Your physician recommends that you continue on your current medications as directed. Please refer to the Current Medication list given to you today.  Labwork: None ordered.  Testing/Procedures: None ordered.  Follow-Up: See instruction letter.  Any Other Special Instructions Will Be Listed Below (If Applicable).  If you need a refill on your cardiac medications before your next appointment, please call your pharmacy.

## 2019-06-17 NOTE — H&P (View-Only) (Signed)
HPI Sara Graves is referred by [name] for evaluation and consideration for ICD insertion. She is a pleasant 78 yo woman with a h/o an ICM, s/p MI, s/p multiple coronary stents with LBBB and worsening LV dysfunction despite PCI. She does not currently have angina. She underwent PCI in March. Repeat echo a couple of weeks ago demonstrated an EF of 25%. She has not had syncope. She has LBBB on her QRS.  Allergies  Allergen Reactions  . Rosiglitazone Maleate Anaphylaxis and Swelling  . Morphine Other (See Comments)    SEVERE HYPOTENSION   . Cephalexin Diarrhea  . Sulfa Antibiotics Diarrhea and Nausea And Vomiting  . Tramadol Other (See Comments)    Unknown reaction  . Elavil [Amitriptyline] Nausea Only  . Tramadol-Acetaminophen Rash     Current Outpatient Medications  Medication Sig Dispense Refill  . aspirin EC 81 MG tablet Take 81 mg by mouth daily.    . busPIRone (BUSPAR) 7.5 MG tablet Take 7.5 mg by mouth 2 (two) times daily.    . Calcium Carb-Cholecalciferol (CALCIUM 600 + D PO) Take 1 tablet by mouth daily.     . clopidogrel (PLAVIX) 75 MG tablet Take 1 tablet (75 mg total) by mouth daily. 90 tablet 1  . FLUoxetine (PROZAC) 10 MG capsule Take 1 capsule by mouth as needed for anxiety.    Marland Kitchen FLUoxetine (PROZAC) 20 MG capsule Take 20 mg by mouth every evening.   1  . glucose blood (ONETOUCH VERIO) test strip 1 each 3 (three) times daily.     . insulin lispro (HUMALOG) 100 UNIT/ML injection Inject 2-6 Units into the skin See admin instructions. For use with V - GO 20 Insulin Delivery Device: inject 6 units subcutaneously three times daily before meals, inject 2 units at bedtime if eating a bedtime snack    . Investigational - Study Medication Take 1 tablet by mouth daily. Study name: dapagliflozin/metformin ER 10/1000mg  Additional study details: This is a Drug Study medication from Dr. Nadyne Coombes at [facility]. Patient started taking this medication on 04/22/18 and is  unsure of how long she is to take this medication for. Per patient, she is on this medication as part of a Heart and Diabetes drug study. 1 each PRN  . isosorbide mononitrate (IMDUR) 60 MG 24 hr tablet Take 60 mg by mouth daily.    Marland Kitchen labetalol (NORMODYNE) 100 MG tablet Take 100 mg by mouth 2 (two) times daily.    Marland Kitchen levothyroxine (SYNTHROID, LEVOTHROID) 50 MCG tablet Take 50 mcg by mouth daily before breakfast.     . losartan (COZAAR) 25 MG tablet Take 1 tablet (25 mg total) by mouth daily. 90 tablet 1  . metoCLOPramide (REGLAN) 5 MG tablet Take 1 tablet by mouth 3 (three) times daily.    . nitroGLYCERIN (NITROSTAT) 0.4 MG SL tablet Place 1 tablet (0.4 mg total) under the tongue every 5 (five) minutes x 3 doses as needed for chest pain. 25 tablet 2  . ONETOUCH VERIO test strip 1 each 3 (three) times daily.   5  . pantoprazole (PROTONIX) 40 MG tablet Take 1 tablet (40 mg total) by mouth 2 (two) times daily. (Patient taking differently: Take 40 mg by mouth daily as needed (acid reflux). ) 60 tablet 1  . torsemide (DEMADEX) 20 MG tablet Take 1 tablet (20 mg total) by mouth every other day. Can increase to  Daily if leg swelling 60 tablet 3  . vitamin B-12 (CYANOCOBALAMIN)  1000 MCG tablet Take 1,000 mcg by mouth daily.     . Vitamin D, Ergocalciferol, (DRISDOL) 50000 units CAPS capsule Take 50,000 Units by mouth every Thursday.      No current facility-administered medications for this visit.      Past Medical History:  Diagnosis Date  . Acute combined systolic and diastolic heart failure ([place]) 04/25/2018  . Anxiety   . Arthritis    "hands" (12/29/2017)  . Basal cell carcinoma of nose    removed  . Bursitis of left shoulder   . Cat scratch fever    Late 90s  . Coronary artery disease   . Depression   . Diabetes mellitus, type II, insulin dependent ([place])   . GERD (gastroesophageal reflux disease)   . H. pylori infection 2008 and 1998   treated  . Hyperlipidemia   . Hypertension   .  Hypothyroidism   . Low oxygen saturation   . Multinodular goiter   . Rotator cuff tear, left recurrent   . Urge urinary incontinence   . UTI (lower urinary tract infection) 05/2016    ROS:   All systems reviewed and negative except as noted in the HPI.   Past Surgical History:  Procedure Laterality Date  . APPENDECTOMY  AGE 36  . BACK SURGERY    . BASAL CELL CARCINOMA EXCISION     "nose"  . BLADDER NECK SUSPENSION  1970's  . BUNIONECTOMY WITH HAMMERTOE RECONSTRUCTION Bilateral   . CARPAL TUNNEL RELEASE Right 05/2017  . COLONOSCOPY W/ POLYPECTOMY    . CORONARY ANGIOPLASTY WITH STENT PLACEMENT  12/29/2017  . CORONARY BALLOON ANGIOPLASTY N/A 02/08/2019   Procedure: CORONARY BALLOON ANGIOPLASTY;  Surgeon: Adrian Prows, MD;  Location: [place] [facility];  Service: Cardiovascular;  Laterality: N/A;  . CORONARY STENT INTERVENTION N/A 12/29/2017   Procedure: CORONARY STENT INTERVENTION;  Surgeon: Nigel Mormon, MD;  Location: [place] [facility];  Service: Cardiovascular;  Laterality: N/A;  . CORONARY STENT INTERVENTION N/A 04/26/2018   Procedure: CORONARY STENT INTERVENTION;  Surgeon: Nigel Mormon, MD;  Location: [place] [facility];  Service: Cardiovascular;  Laterality: N/A;  . CORONARY STENT INTERVENTION N/A 02/08/2019   Procedure: CORONARY STENT INTERVENTION;  Surgeon: Adrian Prows, MD;  Location: [place] [facility];  Service: Cardiovascular;  Laterality: N/A;  . ESOPHAGOGASTRODUODENOSCOPY ENDOSCOPY    . FORAMINAL DECOMPRESSION AT L2 TO THE SACRUM  01-05-2008   L2  -  S1  . GANGLION CYST EXCISION Left 01/17/2009   ring finger  . IMPLANTATION PERMANENT SPINAL CORD STIMULATOR  06-15-2008   JUNE 2013--  BATTERY CHANGE  . JOINT REPLACEMENT    . LEFT HEART CATH AND CORONARY ANGIOGRAPHY N/A 04/26/2018   Procedure: LEFT HEART CATH AND CORONARY ANGIOGRAPHY;  Surgeon: Nigel Mormon, MD;  Location: [place] [facility];  Service: Cardiovascular;  Laterality: N/A;  . LEFT HEART  CATH AND CORONARY ANGIOGRAPHY N/A 02/08/2019   Procedure: LEFT HEART CATH AND CORONARY ANGIOGRAPHY;  Surgeon: Adrian Prows, MD;  Location: [place] [facility];  Service: Cardiovascular;  Laterality: N/A;  . LIPOMA EXCISION Right    RIGHT ELBOW  . METATARSAL HEAD EXCISION Right 06/16/2018   Procedure: right 5th metatarsal excision, a mini c-arm;  Surgeon: Trula Slade, DPM;  [facility];  Service: Podiatry;  Laterality: Right;  anesthesia can do block  . RIGHT/LEFT HEART CATH AND CORONARY ANGIOGRAPHY N/A 12/29/2017   Procedure: RIGHT/LEFT HEART CATH AND CORONARY ANGIOGRAPHY;  Surgeon: Nigel Mormon, MD;  Location: [place] [facility];  Service: Cardiovascular;  Laterality: N/A;  . SHOULDER OPEN ROTATOR CUFF REPAIR  09/07/2012   Procedure: ROTATOR CUFF REPAIR SHOULDER OPEN;  Surgeon: Magnus Sinning, MD;  Location: [facility];  Service: Orthopedics;  Laterality: Left;  OPEN ANTERIOR ACROMIONECTOMY AND ROTATOR CUFF REPAIR ON LEFT   . SHOULDER OPEN ROTATOR CUFF REPAIR Left 12/28/2012   Procedure: ROTATOR CUFF REPAIR SHOULDER OPEN;  Surgeon: Magnus Sinning, MD;  Location: [place];  Service: Orthopedics;  Laterality: Left;  OPEN SHOULDER ROTATOR CUFF REPAIR ON LEFT  WITH ANTERIOR ACROMINECTOMY   . SHOULDER OPEN ROTATOR CUFF REPAIR Right 03/28/2003   RIGHT SHOULDER  DEGENERATIVE AC JOINT AND RC TEAR  . SPINAL CORD STIMULATOR BATTERY EXCHANGE N/A 07/03/2016   Procedure: SPINAL CORD STIMULATOR BATTERY PLACMENT;  Surgeon: Melina Schools, MD;  Location: [facility];  Service: Orthopedics;  Laterality: N/A;  . TONSILLECTOMY  AGE 78  . 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 VAGINAL HYSTERECTOMY  1970's     Family History  Problem Relation Age of Onset  . Cancer Mother        breast  . Heart Problems Mother   . Prostate cancer Father   . Muscular dystrophy Son   . Cancer Maternal Grandmother        breast  . Heart disease Maternal  Grandfather   . Breast cancer Sister      Social History   Socioeconomic History  . Marital status: Married    Spouse name: Not on file  . Number of children: 2  . Years of education: 11th  . Highest education level: Not on file  Occupational History  . Occupation: Retired  Scientific laboratory technician  . Financial resource strain: Not on file  . Food insecurity    Worry: Not on file    Inability: Not on file  . Transportation needs    Medical: Not on file    Non-medical: Not on file  Tobacco Use  . Smoking status: Never Smoker  . Smokeless tobacco: Never Used  Substance and Sexual Activity  . Alcohol use: No  . Drug use: No  . Sexual activity: Yes  Lifestyle  . Physical activity    Days per week: Not on file    Minutes per session: Not on file  . Stress: Not on file  Relationships  . Social Herbalist on phone: Not on file    Gets together: Not on file    Attends religious service: Not on file    Active member of club or organization: Not on file    Attends meetings of clubs or organizations: Not on file    Relationship status: Not on file  . Intimate partner violence    Fear of current or ex partner: Not on file    Emotionally abused: Not on file    Physically abused: Not on file    Forced sexual activity: Not on file  Other Topics Concern  . Not on file  Social History Narrative   Pt lives with her husband and 1 grown son in [place]. Younger son has muscular dystrophy. Having a difficult time as primary caregiver to son who is not doing well.  Declined respite care/placement.   Caffeine Use: none; very little     BP (!) 148/84   Pulse 96   Ht 5\' 4"  (1.626 m)   Wt 177 lb (80.3 kg)  SpO2 99%   BMI 30.38 kg/m   Physical Exam:  Well appearing 78 yo woman, NAD HEENT: Unremarkable Neck:  6 cm JVD, no thyromegally Lymphatics:  No adenopathy Back:  No CVA tenderness Lungs:  Clear with no wheezes HEART:  Regular rate rhythm, no murmurs, no rubs, no clicks  Abd:  soft, positive bowel sounds, no organomegally, no rebound, no guarding Ext:  2 plus pulses, no edema, no cyanosis, no clubbing Skin:  No rashes no nodules Neuro:  CN II through XII intact, motor grossly intact  EKG - nsr with lbbb   Assess/Plan: 1. ICM - she denies anginal symptoms. She will continue her current meds.  2. Chronic systolic heart failure - she has class 2 symptoms and an EF is 25%. She will undergo biv ICD insertion due to the above and LBBB.   Mikle Bosworth.D.

## 2019-06-18 LAB — CBC WITH DIFFERENTIAL/PLATELET
Basophils Absolute: 0.1 10*3/uL (ref 0.0–0.2)
Basos: 1 %
EOS (ABSOLUTE): 0.2 10*3/uL (ref 0.0–0.4)
Eos: 4 %
Hematocrit: 43.7 % (ref 34.0–46.6)
Hemoglobin: 13.9 g/dL (ref 11.1–15.9)
Immature Grans (Abs): 0 10*3/uL (ref 0.0–0.1)
Immature Granulocytes: 0 %
Lymphocytes Absolute: 2.2 10*3/uL (ref 0.7–3.1)
Lymphs: 33 %
MCH: 30.5 pg (ref 26.6–33.0)
MCHC: 31.8 g/dL (ref 31.5–35.7)
MCV: 96 fL (ref 79–97)
Monocytes Absolute: 0.8 10*3/uL (ref 0.1–0.9)
Monocytes: 12 %
Neutrophils Absolute: 3.4 10*3/uL (ref 1.4–7.0)
Neutrophils: 50 %
Platelets: 139 10*3/uL — ABNORMAL LOW (ref 150–450)
RBC: 4.55 x10E6/uL (ref 3.77–5.28)
RDW: 13.4 % (ref 11.7–15.4)
WBC: 6.7 10*3/uL (ref 3.4–10.8)

## 2019-06-18 LAB — BASIC METABOLIC PANEL
BUN/Creatinine Ratio: 23 (ref 12–28)
BUN: 22 mg/dL (ref 8–27)
CO2: 23 mmol/L (ref 20–29)
Calcium: 10.6 mg/dL — ABNORMAL HIGH (ref 8.7–10.3)
Chloride: 101 mmol/L (ref 96–106)
Creatinine, Ser: 0.97 mg/dL (ref 0.57–1.00)
GFR calc Af Amer: 65 mL/min/{1.73_m2} (ref >59)
GFR calc non Af Amer: 56 mL/min/{1.73_m2} — ABNORMAL LOW (ref >59)
Glucose: 213 mg/dL — ABNORMAL HIGH (ref 65–99)
Potassium: 4.2 mmol/L (ref 3.5–5.2)
Sodium: 143 mmol/L (ref 134–144)

## 2019-06-18 LAB — SARS CORONAVIRUS 2 (TAT 6-24 HRS): SARS Coronavirus 2: NEGATIVE

## 2019-06-21 ENCOUNTER — Encounter (HOSPITAL_COMMUNITY): Payer: Self-pay

## 2019-06-21 ENCOUNTER — Ambulatory Visit (HOSPITAL_COMMUNITY)
Admission: RE | Disposition: A | Discharge status: Home / Self Care | Payer: Medicare Other | Source: Home / Self Care | Attending: Internal Medicine

## 2019-06-21 ENCOUNTER — Ambulatory Visit (HOSPITAL_COMMUNITY)
Admission: RE | Admit: 2019-06-21 | Discharge: 2019-06-22 | Disposition: A | Discharge status: Home / Self Care | Payer: Medicare Other | Attending: Internal Medicine | Admitting: Internal Medicine

## 2019-06-21 ENCOUNTER — Other Ambulatory Visit: Payer: Self-pay

## 2019-06-21 DIAGNOSIS — E039 Hypothyroidism, unspecified: Secondary | ICD-10-CM | POA: Diagnosis not present

## 2019-06-21 DIAGNOSIS — Z955 Presence of coronary angioplasty implant and graft: Secondary | ICD-10-CM | POA: Insufficient documentation

## 2019-06-21 DIAGNOSIS — M1711 Unilateral primary osteoarthritis, right knee: Secondary | ICD-10-CM | POA: Insufficient documentation

## 2019-06-21 DIAGNOSIS — Z881 Allergy status to other antibiotic agents status: Secondary | ICD-10-CM | POA: Diagnosis not present

## 2019-06-21 DIAGNOSIS — I5042 Chronic combined systolic (congestive) and diastolic (congestive) heart failure: Secondary | ICD-10-CM | POA: Insufficient documentation

## 2019-06-21 DIAGNOSIS — Z79890 Hormone replacement therapy (postmenopausal): Secondary | ICD-10-CM | POA: Insufficient documentation

## 2019-06-21 DIAGNOSIS — I447 Left bundle-branch block, unspecified: Secondary | ICD-10-CM | POA: Diagnosis present

## 2019-06-21 DIAGNOSIS — Z79899 Other long term (current) drug therapy: Secondary | ICD-10-CM | POA: Insufficient documentation

## 2019-06-21 DIAGNOSIS — Z95810 Presence of automatic (implantable) cardiac defibrillator: Secondary | ICD-10-CM

## 2019-06-21 DIAGNOSIS — I2510 Atherosclerotic heart disease of native coronary artery without angina pectoris: Secondary | ICD-10-CM | POA: Diagnosis not present

## 2019-06-21 DIAGNOSIS — Z7982 Long term (current) use of aspirin: Secondary | ICD-10-CM | POA: Diagnosis not present

## 2019-06-21 DIAGNOSIS — Z90710 Acquired absence of both cervix and uterus: Secondary | ICD-10-CM | POA: Insufficient documentation

## 2019-06-21 DIAGNOSIS — Z885 Allergy status to narcotic agent status: Secondary | ICD-10-CM | POA: Diagnosis not present

## 2019-06-21 DIAGNOSIS — E119 Type 2 diabetes mellitus without complications: Secondary | ICD-10-CM | POA: Insufficient documentation

## 2019-06-21 DIAGNOSIS — I252 Old myocardial infarction: Secondary | ICD-10-CM | POA: Diagnosis not present

## 2019-06-21 DIAGNOSIS — I110 Hypertensive heart disease with heart failure: Secondary | ICD-10-CM | POA: Diagnosis not present

## 2019-06-21 DIAGNOSIS — I5022 Chronic systolic (congestive) heart failure: Secondary | ICD-10-CM | POA: Diagnosis not present

## 2019-06-21 DIAGNOSIS — F329 Major depressive disorder, single episode, unspecified: Secondary | ICD-10-CM | POA: Diagnosis not present

## 2019-06-21 DIAGNOSIS — I5023 Acute on chronic systolic (congestive) heart failure: Secondary | ICD-10-CM | POA: Diagnosis present

## 2019-06-21 DIAGNOSIS — I25118 Atherosclerotic heart disease of native coronary artery with other forms of angina pectoris: Secondary | ICD-10-CM

## 2019-06-21 DIAGNOSIS — Z882 Allergy status to sulfonamides status: Secondary | ICD-10-CM | POA: Insufficient documentation

## 2019-06-21 DIAGNOSIS — F419 Anxiety disorder, unspecified: Secondary | ICD-10-CM | POA: Insufficient documentation

## 2019-06-21 DIAGNOSIS — K219 Gastro-esophageal reflux disease without esophagitis: Secondary | ICD-10-CM | POA: Insufficient documentation

## 2019-06-21 DIAGNOSIS — Z006 Encounter for examination for normal comparison and control in clinical research program: Secondary | ICD-10-CM | POA: Diagnosis not present

## 2019-06-21 DIAGNOSIS — Z794 Long term (current) use of insulin: Secondary | ICD-10-CM | POA: Insufficient documentation

## 2019-06-21 DIAGNOSIS — Z888 Allergy status to other drugs, medicaments and biological substances status: Secondary | ICD-10-CM | POA: Diagnosis not present

## 2019-06-21 DIAGNOSIS — E785 Hyperlipidemia, unspecified: Secondary | ICD-10-CM | POA: Diagnosis not present

## 2019-06-21 DIAGNOSIS — Z8249 Family history of ischemic heart disease and other diseases of the circulatory system: Secondary | ICD-10-CM | POA: Insufficient documentation

## 2019-06-21 DIAGNOSIS — I428 Other cardiomyopathies: Secondary | ICD-10-CM

## 2019-06-21 DIAGNOSIS — Z96651 Presence of right artificial knee joint: Secondary | ICD-10-CM | POA: Insufficient documentation

## 2019-06-21 DIAGNOSIS — Z7902 Long term (current) use of antithrombotics/antiplatelets: Secondary | ICD-10-CM | POA: Diagnosis not present

## 2019-06-21 HISTORY — PX: BIV ICD INSERTION CRT-D: EP1195

## 2019-06-21 HISTORY — DX: Presence of automatic (implantable) cardiac defibrillator: Z95.810

## 2019-06-21 LAB — SURGICAL PCR SCREEN
MRSA, PCR: NEGATIVE
Staphylococcus aureus: NEGATIVE

## 2019-06-21 LAB — GLUCOSE, CAPILLARY: Glucose-Capillary: 166 mg/dL — ABNORMAL HIGH (ref 70–99)

## 2019-06-21 SURGERY — BIV ICD INSERTION CRT-D

## 2019-06-21 MED ORDER — SODIUM CHLORIDE 0.9 % IV SOLN
INTRAVENOUS | Status: AC
  Filled 2019-06-21: qty 2

## 2019-06-21 MED ORDER — VANCOMYCIN HCL IN DEXTROSE 1-5 GM/200ML-% IV SOLN
1000.0000 mg | INTRAVENOUS | Status: AC
  Administered 2019-06-21: 1000 mg via INTRAVENOUS

## 2019-06-21 MED ORDER — LIDOCAINE HCL 1 % IJ SOLN
INTRAMUSCULAR | Status: AC
  Filled 2019-06-21: qty 20

## 2019-06-21 MED ORDER — VANCOMYCIN HCL IN DEXTROSE 1-5 GM/200ML-% IV SOLN
INTRAVENOUS | Status: AC
  Filled 2019-06-21: qty 200

## 2019-06-21 MED ORDER — TORSEMIDE 20 MG PO TABS
20.0000 mg | ORAL_TABLET | ORAL | Status: DC
  Administered 2019-06-22: 09:00:00 20 mg via ORAL
  Filled 2019-06-21: qty 1

## 2019-06-21 MED ORDER — SODIUM CHLORIDE 0.9 % IV SOLN
INTRAVENOUS | Status: DC
  Administered 2019-06-21: 12:00:00 via INTRAVENOUS

## 2019-06-21 MED ORDER — MUPIROCIN 2 % EX OINT
TOPICAL_OINTMENT | CUTANEOUS | Status: AC
  Filled 2019-06-21: qty 22

## 2019-06-21 MED ORDER — LEVOTHYROXINE SODIUM 50 MCG PO TABS
50.0000 ug | ORAL_TABLET | Freq: Every day | ORAL | Status: DC
  Administered 2019-06-22: 50 ug via ORAL
  Filled 2019-06-21: qty 1

## 2019-06-21 MED ORDER — ASPIRIN EC 81 MG PO TBEC
81.0000 mg | DELAYED_RELEASE_TABLET | Freq: Every day | ORAL | Status: DC
  Administered 2019-06-22: 81 mg via ORAL
  Filled 2019-06-21: qty 1

## 2019-06-21 MED ORDER — FLUOXETINE HCL 10 MG PO CAPS
10.0000 mg | ORAL_CAPSULE | Freq: Every day | ORAL | Status: DC
  Administered 2019-06-22: 10 mg via ORAL
  Filled 2019-06-21 (×2): qty 1

## 2019-06-21 MED ORDER — ISOSORBIDE MONONITRATE ER 60 MG PO TB24
60.0000 mg | ORAL_TABLET | Freq: Every day | ORAL | Status: DC
  Administered 2019-06-22: 60 mg via ORAL
  Filled 2019-06-21: qty 1

## 2019-06-21 MED ORDER — CHLORHEXIDINE GLUCONATE 4 % EX LIQD: 60.0000 mL | Freq: Once | CUTANEOUS | Status: DC

## 2019-06-21 MED ORDER — ACETAMINOPHEN 325 MG PO TABS: 325.0000 mg | ORAL_TABLET | ORAL | Status: DC | PRN

## 2019-06-21 MED ORDER — METOCLOPRAMIDE HCL 5 MG PO TABS
5.0000 mg | ORAL_TABLET | Freq: Three times a day (TID) | ORAL | Status: DC
  Administered 2019-06-21 – 2019-06-22 (×2): 5 mg via ORAL
  Filled 2019-06-21 (×2): qty 1

## 2019-06-21 MED ORDER — VITAMIN B-12 1000 MCG PO TABS
1000.0000 ug | ORAL_TABLET | Freq: Every day | ORAL | Status: DC
  Administered 2019-06-22: 1000 ug via ORAL
  Filled 2019-06-21: qty 1

## 2019-06-21 MED ORDER — SODIUM CHLORIDE 0.9 % IV SOLN: 80.0000 mg | INTRAVENOUS | Status: DC

## 2019-06-21 MED ORDER — FENTANYL CITRATE (PF) 100 MCG/2ML IJ SOLN
INTRAMUSCULAR | Status: DC | PRN
  Administered 2019-06-21 (×5): 12.5 ug via INTRAVENOUS

## 2019-06-21 MED ORDER — VITAMIN D (ERGOCALCIFEROL) 1.25 MG (50000 UNIT) PO CAPS: 50000.0000 [IU] | ORAL_CAPSULE | ORAL | Status: DC

## 2019-06-21 MED ORDER — NITROGLYCERIN 0.4 MG SL SUBL: 0.4000 mg | SUBLINGUAL_TABLET | SUBLINGUAL | Status: DC | PRN

## 2019-06-21 MED ORDER — LABETALOL HCL 100 MG PO TABS
100.0000 mg | ORAL_TABLET | Freq: Two times a day (BID) | ORAL | Status: DC
  Administered 2019-06-21 – 2019-06-22 (×2): 100 mg via ORAL
  Filled 2019-06-21 (×2): qty 1

## 2019-06-21 MED ORDER — MIDAZOLAM HCL 5 MG/5ML IJ SOLN
INTRAMUSCULAR | Status: DC | PRN
  Administered 2019-06-21 (×5): 1 mg via INTRAVENOUS

## 2019-06-21 MED ORDER — VANCOMYCIN HCL IN DEXTROSE 1-5 GM/200ML-% IV SOLN
1000.0000 mg | Freq: Two times a day (BID) | INTRAVENOUS | Status: AC
  Administered 2019-06-22: 1000 mg via INTRAVENOUS
  Filled 2019-06-21: qty 200

## 2019-06-21 MED ORDER — LIDOCAINE HCL (PF) 1 % IJ SOLN
INTRAMUSCULAR | Status: DC | PRN
  Administered 2019-06-21: 60 mL

## 2019-06-21 MED ORDER — MIDAZOLAM HCL 5 MG/5ML IJ SOLN
INTRAMUSCULAR | Status: AC
  Filled 2019-06-21: qty 5

## 2019-06-21 MED ORDER — FENTANYL CITRATE (PF) 100 MCG/2ML IJ SOLN
INTRAMUSCULAR | Status: AC
  Filled 2019-06-21: qty 2

## 2019-06-21 MED ORDER — FENTANYL CITRATE (PF) 100 MCG/2ML IJ SOLN: 25.0000 ug | INTRAMUSCULAR | Status: DC | PRN

## 2019-06-21 MED ORDER — LOSARTAN POTASSIUM 25 MG PO TABS
25.0000 mg | ORAL_TABLET | Freq: Every day | ORAL | Status: DC
  Administered 2019-06-22: 25 mg via ORAL
  Filled 2019-06-21: qty 1

## 2019-06-21 MED ORDER — HEPARIN (PORCINE) IN NACL 1000-0.9 UT/500ML-% IV SOLN
INTRAVENOUS | Status: AC
  Filled 2019-06-21: qty 500

## 2019-06-21 MED ORDER — IOHEXOL 350 MG/ML SOLN
INTRAVENOUS | Status: DC | PRN
  Administered 2019-06-21: 15:00:00 7 mL via INTRAVENOUS

## 2019-06-21 MED ORDER — HEPARIN (PORCINE) IN NACL 1000-0.9 UT/500ML-% IV SOLN
INTRAVENOUS | Status: DC | PRN
  Administered 2019-06-21: 500 mL

## 2019-06-21 MED ORDER — BUSPIRONE HCL 5 MG PO TABS
7.5000 mg | ORAL_TABLET | Freq: Two times a day (BID) | ORAL | Status: DC
  Administered 2019-06-21 – 2019-06-22 (×2): 7.5 mg via ORAL
  Filled 2019-06-21 (×2): qty 2

## 2019-06-21 MED ORDER — ONDANSETRON HCL 4 MG/2ML IJ SOLN: 4.0000 mg | Freq: Four times a day (QID) | INTRAMUSCULAR | Status: DC | PRN

## 2019-06-21 SURGICAL SUPPLY — 17 items
ADAPTER SEALING SSA-EW-09 (MISCELLANEOUS) ×3 IMPLANT
ADPR INTRO LNG 9FR SL XTD WNG (MISCELLANEOUS) ×1
CABLE SURGICAL S-101-97-12 (CABLE) ×3 IMPLANT
CATH ATTAIN COM SURV 6250V-MB2 (CATHETERS) ×3 IMPLANT
CATH ATTAIN SEL SURV 6248V-90 (CATHETERS) ×3 IMPLANT
CATH HEX JOSEPH 2-5-2 65CM 6F (CATHETERS) ×3 IMPLANT
ICD CLARIA MRI DTMA1Q1 (ICD Generator) ×3 IMPLANT
LEAD ATTAIN PERFOMA 4298-88CM (Lead) ×3 IMPLANT
LEAD CAPSURE NOVUS 45CM (Lead) ×3 IMPLANT
LEAD SPRINT QUAT SEC 6935-58CM (Lead) ×3 IMPLANT
PAD PRO RADIOLUCENT 2001M-C (PAD) ×3 IMPLANT
SHEATH 9.5FR PRELUDE SNAP 13 (SHEATH) ×3 IMPLANT
SHEATH 9FR PRELUDE SNAP 13 (SHEATH) ×3 IMPLANT
SHEATH CLASSIC 9.5F (SHEATH) ×6 IMPLANT
SLITTER 6232ADJ (MISCELLANEOUS) ×3 IMPLANT
TRAY PACEMAKER INSERTION (PACKS) ×3 IMPLANT
WIRE ACUITY WHISPER EDS 4648 (WIRE) ×3 IMPLANT

## 2019-06-21 NOTE — Interval H&P Note (Signed)
History and Physical Interval Note:  06/21/2019 2:14 PM  Sara Graves  has presented today for surgery, with the diagnosis of cardiomyopath.  The various methods of treatment have been discussed with the patient and family. After consideration of risks, benefits and other options for treatment, the patient has consented to  Procedure(s): BIV ICD INSERTION CRT-D (N/A) as a surgical intervention.  The patient's history has been reviewed, patient examined, no change in status, stable for surgery.  I have reviewed the patient's chart and labs.  Questions were answered to the patient's satisfaction.     Cristopher Peru

## 2019-06-21 NOTE — Plan of Care (Signed)
  Problem: Clinical Measurements: Goal: Will remain free from infection Outcome: Progressing   Problem: Activity: Goal: Risk for activity intolerance will decrease Outcome: Progressing   Problem: Safety: Goal: Ability to remain free from injury will improve Outcome: Progressing   

## 2019-06-21 NOTE — Discharge Instructions (Signed)
After Your ICD (Implantable Cardiac Defibrillator)       You have a Medtronic ICD   Do not lift your arm above shoulder height for 1 week after your procedure. After 7 days, you may progress as below.     Tuesday June 28, 2019  Wednesday June 29, 2019 Thursday June 30, 2019 Friday July 01, 2019    Do not lift, push, pull, or carry anything over 10 pounds with the affected arm until Tuesday August 02, 2019 .   Monitor your defibrillator site for redness, swelling, and drainage. Call the device clinic at [phone] if you experience these symptoms or fever/chills.   If your incision is closed with Steri-strips or staples:  You may shower 7 days after your procedure and wash your incision with soap and water. Avoid lotions, ointments, or perfumes over your incision until it is well-healed.   You may use a hot tub or a pool AFTER your wound check appointment if the incision is completely closed.   Your ICD  is MRI compatible.   Your ICD is designed to protect you from life threatening heart rhythms. Because of this, you may receive a shock.   o 1 shock with no symptoms:  Call the office during business hours. o 1 shock with symptoms (chest pain, chest pressure, dizziness, lightheadedness, shortness of breath, overall feeling unwell):  Call 911. o If you experience 2 or more shocks in 24 hours:  Call 911. o If you receive a shock, you should not drive for 6 months per the [place] DMV IF you receive appropriate therapy from your ICD.    ICD Alerts:  Some alerts are vibratory and others beep. These are NOT emergencies. Please call our office to let us know. If this occurs at night or on weekends, it can wait until the next business day. Send a remote transmission.   If your device is capable of reading fluid status (for heart failure), you will be offered monthly monitoring to review this with you.    Remote monitoring is used to monitor your ICD from home. This  monitoring is scheduled every 91 days by our office. It allows [place] to keep an eye on the functioning of your device to ensure it is working properly. You will routinely see your Electrophysiologist annually (more often if necessary).

## 2019-06-22 ENCOUNTER — Ambulatory Visit (HOSPITAL_COMMUNITY): Payer: Medicare Other

## 2019-06-22 DIAGNOSIS — Z006 Encounter for examination for normal comparison and control in clinical research program: Secondary | ICD-10-CM | POA: Diagnosis not present

## 2019-06-22 DIAGNOSIS — I447 Left bundle-branch block, unspecified: Secondary | ICD-10-CM

## 2019-06-22 DIAGNOSIS — I110 Hypertensive heart disease with heart failure: Secondary | ICD-10-CM | POA: Diagnosis not present

## 2019-06-22 DIAGNOSIS — I5022 Chronic systolic (congestive) heart failure: Secondary | ICD-10-CM

## 2019-06-22 DIAGNOSIS — I428 Other cardiomyopathies: Secondary | ICD-10-CM | POA: Diagnosis not present

## 2019-06-22 MED ORDER — CLOPIDOGREL BISULFATE 75 MG PO TABS: 75.0000 mg | ORAL_TABLET | Freq: Every day | ORAL | 1 refills | Status: DC

## 2019-06-22 MED ORDER — SODIUM CHLORIDE 0.9 % IV SOLN
INTRAVENOUS | Status: DC | PRN
  Administered 2019-06-22: 150 mL via INTRAVENOUS

## 2019-06-22 NOTE — Progress Notes (Signed)
Pt's BP improved. MD stated that pt is ok to go home. Discharged information and education was given. Pt's belonging was given to pt's husband. Pt and her husband were transferred to hospital main entrance via wheelchair.

## 2019-06-22 NOTE — Progress Notes (Signed)
Paged for orthostatics as patient was getting up to leave.   Initial BP 78/50 and improved into 90s with sitting. Pt given torsemide this am. (NPO for the majority of the day this am)  Nurse to let patient eat some lunch and something to drink and monitor for 30-60 minutes. Instructed to hold her torsemide on Friday (Takes every other day)   Legrand Como "Oda Kilts, [place] Pager: [phone]  06/22/2019 11:02 AM

## 2019-06-22 NOTE — Discharge Summary (Addendum)
ELECTROPHYSIOLOGY PROCEDURE DISCHARGE SUMMARY    [name],  MRN: [id number], DOB/AGE: 07/04/1941 78 y.o.  Admit date: 06/21/2019 Discharge date: 06/22/2019  Primary Care Physician: Selinda Orion  Primary Cardiologist: No primary care provider on file.  Electrophysiologist: Dr. Lovena Le  Primary Diagnosis:  Chronic systolic CHF  Secondary Diagnosis: ICM LBBB  Allergies  Allergen Reactions   Rosiglitazone Maleate Anaphylaxis and Swelling   Morphine Other (See Comments)    SEVERE HYPOTENSION    Cephalexin Diarrhea   Sulfa Antibiotics Diarrhea and Nausea And Vomiting   Tramadol Other (See Comments)    Unknown reaction   Elavil [Amitriptyline] Nausea Only   Tramadol-Acetaminophen Rash     Procedures This Admission:  1.  Implantation of a Medtronic BiV ICD on 06/21/2019 by Dr. Lovena Le.  The patient received a MDT model number [id number]  ICD with model number [id number] right atrial lead, [id number] right ventricular lead, and [id number] left ventricular lead.  There were no immediate post procedure complications. 2.  CXR on 06/22/2019 demonstrated no pneumothorax status post device implantation.   Brief HPI: Sara Graves is a 78 y.o. female was referred to electrophysiology in the outpatient setting for consideration of ICD implantation.  Past medical history includes above.  The patient has persistent LV dysfunction despite guideline directed therapy.  Risks, benefits, and alternatives to ICD implantation were reviewed with the patient who wished to proceed.   Hospital Course:  The patient was admitted and underwent implantation of a Medtronic BiV ICD with details as outlined above. They were monitored on telemetry overnight which demonstrated stable device function.  Left chest was without hematoma or ecchymosis.  The device was interrogated and found to be functioning normally.  CXR was obtained and demonstrated no pneumothorax status post device  implantation.  Wound care, arm mobility, and restrictions were reviewed with the patient.  The patient was examined and considered stable for discharge to home.   The patient's discharge medications include an ACE-I/ARB/ARNI (Losartan) and beta blocker (labetalol).   Physical Exam: Vitals:   06/21/19 2054 06/22/19 0033 06/22/19 0526 06/22/19 0755  BP: 139/62 (!) 126/55 (!) 152/63 (!) 136/43  Pulse: (!) 58 67 63 65  Resp: 20 20 20 15   Temp: 98.1 F (36.7 C) 97.9 F (36.6 C) 98.1 F (36.7 C) 98.2 F (36.8 C)  TempSrc: Oral Oral Oral Oral  SpO2: 97% 99% 96% 96%  Weight:      Height:       GEN- The patient is well appearing, alert and oriented x 3 today.   HEENT: normocephalic, atraumatic; sclera clear, conjunctiva pink; hearing intact; oropharynx clear; neck supple, no JVP Lymph- no cervical lymphadenopathy Lungs- Clear to ausculation bilaterally, normal work of breathing.  No wheezes, rales, rhonchi Heart- Regular rate and rhythm, no murmurs, rubs or gallops, PMI not laterally displaced GI- soft, non-tender, non-distended, bowel sounds present, no hepatosplenomegaly Extremities- no clubbing, cyanosis, or edema; DP/PT/radial pulses 2+ bilaterally MS- no significant deformity or atrophy Skin- warm and dry, no rash or lesion, left chest without very slight ecchymosis into L axillary. No hematoma. Psych- euthymic mood, full affect Neuro- strength and sensation are intact  Labs:   Lab Results  Component Value Date   WBC 6.7 06/17/2019   HGB 13.9 06/17/2019   HCT 43.7 06/17/2019   MCV 96 06/17/2019   PLT 139 (L) 06/17/2019    Recent Labs  Lab 06/17/19 1258  NA 143  K 4.2  CL  101  CO2 23  BUN 22  CREATININE 0.97  CALCIUM 10.6*  GLUCOSE 213*    Discharge Medications:  Allergies as of 06/22/2019      Reactions   Rosiglitazone Maleate Anaphylaxis, Swelling   Morphine Other (See Comments)   SEVERE HYPOTENSION   Cephalexin Diarrhea   Sulfa Antibiotics Diarrhea, Nausea  And Vomiting   Tramadol Other (See Comments)   Unknown reaction   Elavil [amitriptyline] Nausea Only   Tramadol-acetaminophen Rash      Medication List    TAKE these medications   aspirin EC 81 MG tablet Take 81 mg by mouth daily.   busPIRone 7.5 MG tablet Commonly known as: BUSPAR Take 7.5 mg by mouth 2 (two) times daily.   CALCIUM 600 + D PO Take 1 tablet by mouth daily.   clopidogrel 75 MG tablet Commonly known as: PLAVIX Take 1 tablet (75 mg total) by mouth daily. Start taking on: June 25, 2019 What changed: These instructions start on June 25, 2019. If you are unsure what to do until then, ask your doctor or other care provider.   FLUoxetine 10 MG capsule Commonly known as: PROZAC Take 1 capsule by mouth daily.   insulin lispro 100 UNIT/ML injection Commonly known as: HUMALOG Inject 2-6 Units into the skin See admin instructions. For use with V - GO 20 Insulin Delivery Device: inject 6 units subcutaneously three times daily before meals, inject 2 units at bedtime if eating a bedtime snack   Investigational - Study Medication Take 1 tablet by mouth daily. Study name: dapagliflozin/metformin ER 10/1000mg  Additional study details: This is a Drug Study medication from Dr. Nadyne Coombes at [facility]. Patient started taking this medication on 04/22/18 and is unsure of how long she is to take this medication for. Per patient, she is on this medication as part of a Heart and Diabetes drug study.   isosorbide mononitrate 60 MG 24 hr tablet Commonly known as: IMDUR Take 60 mg by mouth daily.   labetalol 100 MG tablet Commonly known as: NORMODYNE Take 100 mg by mouth 2 (two) times daily.   levothyroxine 50 MCG tablet Commonly known as: SYNTHROID Take 50 mcg by mouth daily before breakfast.   losartan 25 MG tablet Commonly known as: COZAAR Take 1 tablet (25 mg total) by mouth daily.   metoCLOPramide 5 MG tablet Commonly known as: REGLAN Take 1 tablet by mouth 3  (three) times daily.   nitroGLYCERIN 0.4 MG SL tablet Commonly known as: NITROSTAT Place 1 tablet (0.4 mg total) under the tongue every 5 (five) minutes x 3 doses as needed for chest pain.   OneTouch Verio test strip Generic drug: glucose blood 1 each 3 (three) times daily.   OneTouch Verio test strip Generic drug: glucose blood 1 each 3 (three) times daily.   pantoprazole 40 MG tablet Commonly known as: PROTONIX Take 1 tablet (40 mg total) by mouth 2 (two) times daily. What changed:   when to take this  reasons to take this   torsemide 20 MG tablet Commonly known as: DEMADEX Take 1 tablet (20 mg total) by mouth every other day. Can increase to  Daily if leg swelling   vitamin B-12 1000 MCG tablet Commonly known as: CYANOCOBALAMIN Take 1,000 mcg by mouth daily.   Vitamin D (Ergocalciferol) 1.25 MG (50000 UT) Caps capsule Commonly known as: DRISDOL Take 50,000 Units by mouth every Thursday.       Disposition:   Follow-up Information    [facility] Follow up on 07/05/2019.   Why: at 0930 for pacemaker Wound check.  Contact information: [place] [id number] [phone]          Duration of Discharge Encounter: Greater than 30 minutes including physician time.  Jacalyn Lefevre, PA-C  06/22/2019 8:49 AM   EP Attending  Patient seen and examined. Agree with above. The patient is doing well after biv ICD insertion. CXR demonstrates normal lead position. ICD interrogation under my direction demonstrates normal Biv ICD function. His incision demonstrates no hematoma. She will be discharged home with the usual followup.  Mikle Bosworth.D.

## 2019-06-22 NOTE — Progress Notes (Signed)
While helping pt get ready to going home, pt complained feeling dizzy. Checked pt's vital signs showed BP 78/60, O2 sat 90%. Manual BP 80/60. Called MD via phone. MD advised giving pt salty snacks and water, keep pt in 1 hour then recheck BP. Snacks and water were given to pt. Pt's husband currently is in room with pt. Will continue to monitor pt.

## 2019-06-23 ENCOUNTER — Encounter (HOSPITAL_COMMUNITY): Payer: Self-pay | Admitting: Internal Medicine

## 2019-06-23 MED FILL — Gentamicin Sulfate Inj 40 MG/ML: INTRAMUSCULAR | Qty: 80 | Status: AC

## 2019-07-04 ENCOUNTER — Telehealth: Payer: Self-pay

## 2019-07-04 NOTE — Telephone Encounter (Signed)
    COVID-19 Pre-Screening Questions:  . In the past 7 to 10 days have you had a cough,  shortness of breath, headache, congestion, fever (100 or greater) body aches, chills, sore throat, or sudden loss of taste or sense of smell?No . Have you been around anyone with known Covid 19. No . Have you been around anyone who is awaiting Covid 19 test results in the past 7 to 10 days? No . Have you been around anyone who has been exposed to Covid 19, or has mentioned symptoms of Covid 19 within the past 7 to 10 days? No  If you have any concerns/questions about symptoms patients report during screening (either on the phone or at threshold). Contact the provider seeing the patient or DOD for further guidance.  If neither are available contact a member of the leadership team.         Pt answered No to all covid-19 prescreening but then the phone was disconnected.

## 2019-07-05 ENCOUNTER — Other Ambulatory Visit: Payer: Self-pay

## 2019-07-05 ENCOUNTER — Ambulatory Visit (INDEPENDENT_AMBULATORY_CARE_PROVIDER_SITE_OTHER): Payer: Medicare Other | Admitting: *Deleted

## 2019-07-05 DIAGNOSIS — I428 Other cardiomyopathies: Secondary | ICD-10-CM | POA: Diagnosis not present

## 2019-07-05 LAB — CUP PACEART INCLINIC DEVICE CHECK
Battery Remaining Longevity: 110 mo
Battery Voltage: 3.09 V
Brady Statistic AP VP Percent: 1.38 %
Brady Statistic AP VS Percent: 0.05 %
Brady Statistic AS VP Percent: 97 %
Brady Statistic AS VS Percent: 1.57 %
Brady Statistic RA Percent Paced: 1.43 %
Brady Statistic RV Percent Paced: 1.92 %
Date Time Interrogation Session: 20200818100150
HighPow Impedance: 44 Ohm
Implantable Lead Implant Date: 20200804
Implantable Lead Implant Date: 20200804
Implantable Lead Implant Date: 20200804
Implantable Lead Location: 753858
Implantable Lead Location: 753859
Implantable Lead Location: 753860
Implantable Lead Model: 4298
Implantable Lead Model: 5076
Implantable Lead Model: 6935
Implantable Pulse Generator Implant Date: 20200804
Lead Channel Impedance Value: 123.5 Ohm
Lead Channel Impedance Value: 132.321
Lead Channel Impedance Value: 132.321
Lead Channel Impedance Value: 136.276
Lead Channel Impedance Value: 136.276
Lead Channel Impedance Value: 247 Ohm
Lead Channel Impedance Value: 247 Ohm
Lead Channel Impedance Value: 285 Ohm
Lead Channel Impedance Value: 304 Ohm
Lead Channel Impedance Value: 342 Ohm
Lead Channel Impedance Value: 361 Ohm
Lead Channel Impedance Value: 399 Ohm
Lead Channel Impedance Value: 418 Ohm
Lead Channel Impedance Value: 418 Ohm
Lead Channel Impedance Value: 418 Ohm
Lead Channel Impedance Value: 418 Ohm
Lead Channel Impedance Value: 418 Ohm
Lead Channel Impedance Value: 456 Ohm
Lead Channel Pacing Threshold Amplitude: 0.625 V
Lead Channel Pacing Threshold Amplitude: 0.625 V
Lead Channel Pacing Threshold Amplitude: 0.875 V
Lead Channel Pacing Threshold Pulse Width: 0.4 ms
Lead Channel Pacing Threshold Pulse Width: 0.4 ms
Lead Channel Pacing Threshold Pulse Width: 0.4 ms
Lead Channel Sensing Intrinsic Amplitude: 19.875 mV
Lead Channel Sensing Intrinsic Amplitude: 2.5 mV
Lead Channel Sensing Intrinsic Amplitude: 27.125 mV
Lead Channel Sensing Intrinsic Amplitude: 3.25 mV
Lead Channel Setting Pacing Amplitude: 2.25 V
Lead Channel Setting Pacing Amplitude: 3.5 V
Lead Channel Setting Pacing Amplitude: 3.5 V
Lead Channel Setting Pacing Pulse Width: 0.4 ms
Lead Channel Setting Pacing Pulse Width: 0.4 ms
Lead Channel Setting Sensing Sensitivity: 0.3 mV

## 2019-07-05 NOTE — Progress Notes (Signed)
Wound check appointment. Steri-strips removed. Wound without redness. Moderate amount of edema noted, hold Plavix x3 days per WC. Incision edges approximated, wound well healed. Normal device function. Thresholds, sensing, and impedances consistent with implant measurements. Device programmed at 3.5V for extra safety margin until 3 month visit. Histogram distribution appropriate for patient and level of activity. No mode switches or ventricular arrhythmias noted. Patient educated about wound care, arm mobility, lifting restrictions, shock plan. Remote scheduled 09/20/19 & ROV w/ GT 09/20/19.

## 2019-07-05 NOTE — Patient Instructions (Signed)
Hold Plavix for 3 days. Call the [facility] at [phone] if the area gets bigger.

## 2019-08-18 ENCOUNTER — Other Ambulatory Visit: Payer: Self-pay

## 2019-08-18 ENCOUNTER — Ambulatory Visit (INDEPENDENT_AMBULATORY_CARE_PROVIDER_SITE_OTHER): Payer: Medicare Other | Admitting: Cardiology

## 2019-08-18 DIAGNOSIS — I499 Cardiac arrhythmia, unspecified: Secondary | ICD-10-CM

## 2019-08-21 ENCOUNTER — Encounter: Payer: Self-pay | Admitting: Cardiology

## 2019-08-21 NOTE — Progress Notes (Signed)
EKG only

## 2019-08-26 ENCOUNTER — Other Ambulatory Visit: Payer: Self-pay

## 2019-08-26 DIAGNOSIS — I2510 Atherosclerotic heart disease of native coronary artery without angina pectoris: Secondary | ICD-10-CM

## 2019-08-26 MED ORDER — NITROGLYCERIN 0.4 MG SL SUBL: 0.4000 mg | SUBLINGUAL_TABLET | SUBLINGUAL | 2 refills | Status: DC | PRN

## 2019-08-31 ENCOUNTER — Ambulatory Visit: Payer: Medicare Other | Admitting: Cardiology

## 2019-08-31 ENCOUNTER — Other Ambulatory Visit: Payer: Self-pay

## 2019-08-31 ENCOUNTER — Encounter: Payer: Self-pay | Admitting: Cardiology

## 2019-08-31 VITALS — BP 189/81 | HR 85 | Temp 95.7°F | Ht 65.0 in | Wt 181.4 lb

## 2019-08-31 DIAGNOSIS — N184 Chronic kidney disease, stage 4 (severe): Secondary | ICD-10-CM

## 2019-08-31 DIAGNOSIS — E1122 Type 2 diabetes mellitus with diabetic chronic kidney disease: Secondary | ICD-10-CM

## 2019-08-31 DIAGNOSIS — I129 Hypertensive chronic kidney disease with stage 1 through stage 4 chronic kidney disease, or unspecified chronic kidney disease: Secondary | ICD-10-CM

## 2019-08-31 DIAGNOSIS — I5042 Chronic combined systolic (congestive) and diastolic (congestive) heart failure: Secondary | ICD-10-CM

## 2019-08-31 DIAGNOSIS — I25118 Atherosclerotic heart disease of native coronary artery with other forms of angina pectoris: Secondary | ICD-10-CM | POA: Diagnosis not present

## 2019-08-31 DIAGNOSIS — Z95810 Presence of automatic (implantable) cardiac defibrillator: Secondary | ICD-10-CM

## 2019-08-31 DIAGNOSIS — I10 Essential (primary) hypertension: Secondary | ICD-10-CM

## 2019-08-31 DIAGNOSIS — R197 Diarrhea, unspecified: Secondary | ICD-10-CM

## 2019-08-31 MED ORDER — HYDRALAZINE HCL 25 MG PO TABS: 25.0000 mg | ORAL_TABLET | Freq: Three times a day (TID) | ORAL | 2 refills | Status: DC

## 2019-08-31 NOTE — Progress Notes (Signed)
Primary Physician:  [name], PA-C   [name], female    DOB: Feb 07, 1941, 78 y.o.   MRN: [id number]  Subjective:    Chief Complaint  Patient presents with   Hypertension   Coronary Artery Disease   Follow-up    3 month    HPI: Sara Graves  is a 78 y.o. female  with hypertension, uncontrolled type 2 DM, CAD s/p prior LAD and ramus PCI, HFpEF, h/o Rt leg cellulitis and metatarsal fracture, managed by Podiatry. Had admission to hospital for CHF on 01/23/2019 due to marked dyspnea and discharged on 2L [place] O2. Echo revealed new decrease in LVEF and underwent coronary angiogram on 01/27/19 and PCI to LAD (new stent) and balloon PTCA to IST RI lesion.   She underwent echocardiogram on 05/19/19 that continued to reveal depressed LVEF of 20-25%.  She was evaluated by Dr. Lovena Le and underwent BiV ICD implantation on 06/21/2019.  She is now here on a 53-monthoffice visit. Continues to do well in regard to chest pain and dyspnea. Does report one episode of wheezing and shortness of breath that improved with nitroglycerin.  She has noticed some leg swelling the last few days. By endocrinology note, her diabetes continues to be uncontrolled with last hemoglobin A1c of 8.5%.  She reports that she has nausea/vomiting and diarrhea for the last several weeks. She is possibly having colonoscopy and endoscopy in November.  Past Medical History:  Diagnosis Date   Acute combined systolic and diastolic heart failure ([place] 04/25/2018   Anxiety    Arthritis    "hands" (12/29/2017)   Basal cell carcinoma of nose    removed   Bursitis of left shoulder    Cat scratch fever    Late 90s   Coronary artery disease    Depression    Diabetes mellitus, type II, insulin dependent (HCC)    GERD (gastroesophageal reflux disease)    H. pylori infection 2008 and 1998   treated   Hyperlipidemia    Hypertension    Hypothyroidism    Low oxygen saturation     Multinodular goiter    Rotator cuff tear, left recurrent    Urge urinary incontinence    UTI (lower urinary tract infection) 05/2016    Past Surgical History:  Procedure Laterality Date   APPENDECTOMY  AGE 46   BACK SURGERY     BASAL CELL CARCINOMA EXCISION     "nose"   BIV ICD INSERTION CRT-D N/A 06/21/2019   Procedure: BIV ICD INSERTION CRT-D;  Surgeon: TEvans Lance MD;  [facility];  Service: Cardiovascular;  Laterality: N/A;   BLADDER NECK SUSPENSION  1970's   BUNIONECTOMY WITH HAMMERTOE RECONSTRUCTION Bilateral    CARPAL TUNNEL RELEASE Right 05/2017   COLONOSCOPY W/ POLYPECTOMY     CORONARY ANGIOPLASTY WITH STENT PLACEMENT  12/29/2017   CORONARY BALLOON ANGIOPLASTY N/A 02/08/2019   Procedure: CORONARY BALLOON ANGIOPLASTY;  Surgeon: GAdrian Prows MD;  [facility];  Service: Cardiovascular;  Laterality: N/A;   CORONARY STENT INTERVENTION N/A 12/29/2017   Procedure: CORONARY STENT INTERVENTION;  Surgeon: PNigel Mormon MD;  [facility];  Service: Cardiovascular;  Laterality: N/A;   CORONARY STENT INTERVENTION N/A 04/26/2018   Procedure: CORONARY STENT INTERVENTION;  Surgeon: PNigel Mormon MD;  [facility];  Service: Cardiovascular;  Laterality: N/A;   CORONARY STENT INTERVENTION N/A 02/08/2019   Procedure: CORONARY STENT INTERVENTION;  Surgeon: Adrian Prows, MD;  Location: [facility];  Service: Cardiovascular;  Laterality: N/A;   ESOPHAGOGASTRODUODENOSCOPY ENDOSCOPY     FORAMINAL DECOMPRESSION AT L2 TO THE SACRUM  01-05-2008   L2  -  S1   GANGLION CYST EXCISION Left 01/17/2009   ring finger   IMPLANTATION PERMANENT SPINAL CORD STIMULATOR  06-15-2008   JUNE 2013--  BATTERY CHANGE   JOINT REPLACEMENT     LEFT HEART CATH AND CORONARY ANGIOGRAPHY N/A 04/26/2018   Procedure: LEFT HEART CATH AND CORONARY ANGIOGRAPHY;  Surgeon: Nigel Mormon, MD;  Location: [facility];  Service: Cardiovascular;  Laterality: N/A;   LEFT HEART CATH AND CORONARY ANGIOGRAPHY N/A 02/08/2019   Procedure: LEFT HEART CATH AND CORONARY ANGIOGRAPHY;  Surgeon: Adrian Prows, MD;  Location: [facility];  Service: Cardiovascular;  Laterality: N/A;   LIPOMA EXCISION Right    RIGHT ELBOW   METATARSAL HEAD EXCISION Right 06/16/2018   Procedure: right 5th metatarsal excision, a mini c-arm;  Surgeon: Trula Slade, DPM;  Location: [facility];  Service: Podiatry;  Laterality: Right;  anesthesia can do block   RIGHT/LEFT HEART CATH AND CORONARY ANGIOGRAPHY N/A 12/29/2017   Procedure: RIGHT/LEFT HEART CATH AND CORONARY ANGIOGRAPHY;  Surgeon: Nigel Mormon, MD;  Location: [facility];  Service: Cardiovascular;  Laterality: N/A;   SHOULDER OPEN ROTATOR CUFF REPAIR  09/07/2012   Procedure: ROTATOR CUFF REPAIR SHOULDER OPEN;  Surgeon: Magnus Sinning, MD;  Location: [facility];  Service: Orthopedics;  Laterality: Left;  OPEN ANTERIOR ACROMIONECTOMY AND ROTATOR CUFF REPAIR ON LEFT    SHOULDER OPEN ROTATOR CUFF REPAIR Left 12/28/2012   Procedure: ROTATOR CUFF REPAIR SHOULDER OPEN;  Surgeon: Magnus Sinning, MD;  Location: [facility];  Service: Orthopedics;  Laterality: Left;  OPEN SHOULDER ROTATOR CUFF REPAIR ON LEFT  WITH ANTERIOR ACROMINECTOMY    SHOULDER OPEN ROTATOR CUFF REPAIR Right 03/28/2003   RIGHT SHOULDER  DEGENERATIVE AC JOINT AND RC TEAR   SPINAL CORD STIMULATOR BATTERY EXCHANGE N/A 07/03/2016   Procedure: SPINAL CORD STIMULATOR BATTERY PLACMENT;  Surgeon: Melina Schools, MD;  Location: [facility];  Service: Orthopedics;  Laterality: N/A;   TONSILLECTOMY  AGE 35   TOTAL KNEE ARTHROPLASTY Right 05/06/2000   OA RIGHT KNEE   VAGINAL HYSTERECTOMY  1970's    Social History   Socioeconomic History   Marital status: Married    Spouse name: Not on file   Number of children: 2   Years of education: 11th   Highest education  level: Not on file  Occupational History   Occupation: Retired  Scientist, product/process development strain: Not on file   Food insecurity    Worry: Not on file    Inability: Not on Lexicographer needs    Medical: Not on file    Non-medical: Not on file  Tobacco Use   Smoking status: Never Smoker   Smokeless tobacco: Never Used  Substance and Sexual Activity   Alcohol use: No   Drug use: No   Sexual activity: Yes  Lifestyle   Physical activity    Days per week: Not on file    Minutes per session: Not on file   Stress: Not on file  Relationships   Social connections    Talks on phone: Not on file    Gets together: Not on file    Attends religious service: Not on file    Active  member of club or organization: Not on file    Attends meetings of clubs or organizations: Not on file    Relationship status: Not on file   Intimate partner violence    Fear of current or ex partner: Not on file    Emotionally abused: Not on file    Physically abused: Not on file    Forced sexual activity: Not on file  Other Topics Concern   Not on file  Social History Narrative   Pt lives with her husband and 1 grown son in [place]. Younger son has muscular dystrophy. Having a difficult time as primary caregiver to son who is not doing well.  Declined respite care/placement.   Caffeine Use: none; very little    Review of Systems  Constitution: Negative for chills, decreased appetite, malaise/fatigue and weight gain.  Cardiovascular: Positive for dyspnea on exertion (mild and improved). Negative for leg swelling and syncope.  Endocrine: Negative for cold intolerance.  Hematologic/Lymphatic: Does not bruise/bleed easily.  Musculoskeletal: Negative for joint swelling.  Gastrointestinal: Positive for diarrhea and nausea. Negative for abdominal pain, anorexia and change in bowel habit.  Neurological: Positive for loss of balance. Negative for headaches and light-headedness.    Psychiatric/Behavioral: Negative for depression and substance abuse.  All other systems reviewed and are negative.     Objective:  Blood pressure (!) 189/81, pulse 85, temperature (!) 95.7 F (35.4 C), height 5' 5"  (1.651 m), weight 181 lb 6.4 oz (82.3 kg), SpO2 95 %. Body mass index is 30.19 kg/m.    Physical Exam  Constitutional: She appears well-developed and well-nourished. No distress.  HENT:  Head: Atraumatic.  Eyes: Conjunctivae are normal.  Neck: Neck supple. No JVD present. No thyromegaly present.  Cardiovascular: Normal rate, regular rhythm, normal heart sounds and intact distal pulses. Exam reveals no gallop.  No murmur heard. Pulses:      Carotid pulses are 2+ on the right side with bruit and 2+ on the left side.      Femoral pulses are 2+ on the right side and 2+ on the left side.      Popliteal pulses are 0 on the right side and 0 on the left side.       Dorsalis pedis pulses are 0 on the right side and 0 on the left side.       Posterior tibial pulses are 0 on the right side and 0 on the left side.  Pulmonary/Chest: Effort normal and breath sounds normal.  Abdominal: Soft. Bowel sounds are normal.  Musculoskeletal: Normal range of motion.        General: No edema.  Neurological: She is alert.  Skin: Skin is warm and dry.  Psychiatric: She has a normal mood and affect.   Radiology: No results found.  Laboratory examination:  06/10/2019: Creatinine 1.8, potassium 3.9, EGFR 25, CMP otherwise normal.  04/22/2019: Creatinine 1.5, eGFR 33/38, potassium 4.6, CMP otherwise normal. HgbA1c 8.2%   07/08/2018: Cholesterol 166, triglycerides 137, HDL 55, LDL 84.  CMP Latest Ref Rng & Units 06/17/2019 01/23/2019 01/22/2019  Glucose 65 - 99 mg/dL 213(H) 116(H) 152(H)  BUN 8 - 27 mg/dL 22 18 15   Creatinine 0.57 - 1.00 mg/dL 0.97 1.17(H) 1.15(H)  Sodium 134 - 144 mmol/L 143 139 141  Potassium 3.5 - 5.2 mmol/L 4.2 3.6 3.7  Chloride 96 - 106 mmol/L 101 102 105  CO2 20 - 29  mmol/L 23 27 27   Calcium 8.7 - 10.3 mg/dL 10.6(H) 9.0 9.0  Total Protein 6.5 - 8.1 g/dL - - -  Total Bilirubin 0.3 - 1.2 mg/dL - - -  Alkaline Phos 38 - 126 U/L - - -  AST 15 - 41 U/L - - -  ALT 0 - 44 U/L - - -   CBC Latest Ref Rng & Units 06/17/2019 01/21/2019 01/20/2019  WBC 3.4 - 10.8 x10E3/uL 6.7 4.0 4.5  Hemoglobin 11.1 - 15.9 g/dL 13.9 11.1(L) 11.1(L)  Hematocrit 34.0 - 46.6 % 43.7 34.3(L) 34.2(L)  Platelets 150 - 450 x10E3/uL 139(L) 112(L) 106(L)   Lipid Panel     Component Value Date/Time   CHOL 178 03/25/2015 0325   TRIG 116 03/25/2015 0325   HDL 46 03/25/2015 0325   CHOLHDL 3.9 03/25/2015 0325   VLDL 23 03/25/2015 0325   LDLCALC 109 (H) 03/25/2015 0325   LDLDIRECT 90 04/08/2012 1414   HEMOGLOBIN A1C Lab Results  Component Value Date   HGBA1C 7.8 (H) 01/20/2019   MPG 177.16 01/20/2019   TSH Recent Labs    01/20/19 2148  TSH 1.371    PRN Meds:. Medications Discontinued During This Encounter  Medication Reason   FLUoxetine (PROZAC) 10 MG capsule Error   Current Meds  Medication Sig   aspirin EC 81 MG tablet Take 81 mg by mouth daily.   busPIRone (BUSPAR) 7.5 MG tablet Take 7.5 mg by mouth 2 (two) times daily.   Calcium Carb-Cholecalciferol (CALCIUM 600 + D PO) Take 1 tablet by mouth daily.    clopidogrel (PLAVIX) 75 MG tablet Take 1 tablet (75 mg total) by mouth daily.   FLUoxetine (PROZAC) 20 MG tablet Take 20 mg by mouth daily.   glucose blood (ONETOUCH VERIO) test strip 1 each 3 (three) times daily.    insulin lispro (HUMALOG) 100 UNIT/ML injection Inject 2-6 Units into the skin See admin instructions. For use with V - GO 20 Insulin Delivery Device: inject 6 units subcutaneously three times daily before meals, inject 2 units at bedtime if eating a bedtime snack   Investigational - Study Medication Take 1 tablet by mouth daily. Study name: dapagliflozin/metformin ER 10/'1000mg'$  Additional study details: This is a Drug Study medication from Dr. Nadyne Coombes at  [facility]. Patient started taking this medication on 04/22/18 and is unsure of how long she is to take this medication for. Per patient, she is on this medication as part of a Heart and Diabetes drug study.   isosorbide mononitrate (IMDUR) 60 MG 24 hr tablet Take 60 mg by mouth daily.   labetalol (NORMODYNE) 100 MG tablet Take 100 mg by mouth 2 (two) times daily.   levothyroxine (SYNTHROID, LEVOTHROID) 50 MCG tablet Take 50 mcg by mouth daily before breakfast.    losartan (COZAAR) 25 MG tablet Take 1 tablet (25 mg total) by mouth daily.   metoCLOPramide (REGLAN) 5 MG tablet Take 1 tablet by mouth 3 (three) times daily.   nitroGLYCERIN (NITROSTAT) 0.4 MG SL tablet Place 1 tablet (0.4 mg total) under the tongue every 5 (five) minutes x 3 doses as needed for chest pain.   ONETOUCH VERIO test strip 1 each 3 (three) times daily.    pantoprazole (PROTONIX) 40 MG tablet Take 1 tablet (40 mg total) by mouth 2 (two) times daily. (Patient taking differently: Take 40 mg by mouth daily as needed (acid reflux). )   torsemide (DEMADEX) 20 MG tablet Take 1 tablet (20 mg total) by mouth every other day. Can increase to  Daily if leg swelling   vitamin B-12 (  CYANOCOBALAMIN) 1000 MCG tablet Take 1,000 mcg by mouth daily.    Vitamin D, Ergocalciferol, (DRISDOL) 50000 units CAPS capsule Take 50,000 Units by mouth every Thursday.    [DISCONTINUED] FLUoxetine (PROZAC) 10 MG capsule Take 1 capsule by mouth daily.     Cardiac Studies:   Echocardiogram 05/20/2019: Left ventricle cavity is moderately dilated.  Severely depressed LV systolic function with global hypokinesis. LVEF 20-25%. Cannot exclude LV apical thrombus. Moderate concentric hypertrophy of the left ventricle. Normal global wall motion. Doppler evidence of grade II (pseudonormal) diastolic dysfunction, elevated LAP.  Left atrial cavity is severely dilated. Right atrial cavity is mildly dilated. Moderate (Grade II) aortic  regurgitation. Moderate (Grade III) mitral regurgitation. Moderate tricuspid regurgitation. Estimated pulmonary artery systolic pressure is 65 mmHg. Small pericardial effusion.  No significant change compared to previous study on 01/21/2019.   Carotid artery duplex 12/02/2018: No hemodynamically significant arterial disease in the internal carotid artery bilaterally. Minimal plaque noted bilateral carotid arteries. Antegrade right vertebral artery flow. Antegrade left vertebral artery flow.  Coronary angiogram 02/08/2019: Distal left main 20% diffuse disease. Ostial LAD stent shows diffuse 20% in-stent restenosis (3.0 x 15 mm resolute Onyx on 12/29/2017). Mid LAD and mid to distal LAD there are tandem lesions 80% and 95%. Ramus intermediate ostial 95 to 99% stenosis. Previously placed RI stent widely patent ( 2.0 x 12 mm resolute on 04/26/2018). 30 to 40% tandem lesions in the RCA. [place] Balloon angioplasty of the ramus intermediate ostial high-grade stenosis, 99% reduced to 0% and [place]ting Balloon angioplasty of the mid LAD followed by stenting with 2.5 x 32 mm Synergy DES for high-grade 90% stenosis reduced to 0% and TIMI-3 to TIMI-3 flow maintained in both lesions.  Assessment:     ICD-10-CM   1. Coronary artery disease of native artery of native heart with stable angina pectoris (HCC)  I25.118 Lipid Profile    Comprehensive Metabolic Panel (CMET)  2. Chronic combined systolic and diastolic CHF (congestive heart failure) (HCC)  I50.42   3. Biventricular ICD (implantable cardioverter-defibrillator) in place  Z95.810   4. CKD stage 4 due to type 2 diabetes mellitus (HCC)  E11.22 Comprehensive Metabolic Panel (CMET)   Z56.3   5. Diarrhea, unspecified type  R19.7     EKG 02/08/22: Sinus bradycardia at rate of 55 bpm, left bundle branch block.  No further analysis.  No significant change from prior EKG 02/08/2019.  Recommendations:   Patient is doing well since undergoing ICD  implantation.  She is to follow-up with Dr. Lovena Le next month.  She has had 1 episode of angina easily resolved with nitroglycerin.  She does have 1+ leg edema bilaterally today on exam, but no worsening shortness of breath.  Suspect related to her diet.  Encouraged her to make diet modifications and to sit with leg elevation.  If she does not have improvement in the next few days with this and also with taking extra dose of her torsemide, will need to make changes to her medications and she is advised to contact me.  She has had worsening kidney function over the last few months.  Diabetes is also uncontrolled.  She is continuing to be followed by endocrinology for management of this.  In view of her worsening kidney function, she is not on Entresto therapy.  We will continue with low-dose losartan, but if kidney function continues to worsen we will need to discontinue this.  Her blood pressure is markedly elevated today, will add hydralazine 25 mg 3  times daily for better BP control.  She has developed nausea and diarrhea over the last several weeks and is scheduled to see GI next month for possible colonoscopy.  From cardiac standpoint, see no contraindication with proceeding with this.  She will likely need to hold her Plavix for 6 days prior to the procedure.  She questions if her symptoms are related to study drug.  Will discuss with research and have them contact her regarding this.  I will see her back in 4 weeks for follow-up on hypertension.   Miquel Dunn, MSN, APRN, FNP-C [facility]. [place] Office: [phone] Fax: [phone]

## 2019-09-16 LAB — COMPREHENSIVE METABOLIC PANEL
ALT: 10 IU/L (ref 0–32)
AST: 9 IU/L (ref 0–40)
Albumin/Globulin Ratio: 2 (ref 1.2–2.2)
Albumin: 4.5 g/dL (ref 3.7–4.7)
Alkaline Phosphatase: 96 IU/L (ref 39–117)
BUN/Creatinine Ratio: 19 (ref 12–28)
BUN: 21 mg/dL (ref 8–27)
Bilirubin Total: 0.6 mg/dL (ref 0.0–1.2)
CO2: 23 mmol/L (ref 20–29)
Calcium: 9.4 mg/dL (ref 8.7–10.3)
Chloride: 106 mmol/L (ref 96–106)
Creatinine, Ser: 1.08 mg/dL — ABNORMAL HIGH (ref 0.57–1.00)
GFR calc Af Amer: 57 mL/min/{1.73_m2} — ABNORMAL LOW (ref >59)
GFR calc non Af Amer: 49 mL/min/{1.73_m2} — ABNORMAL LOW (ref >59)
Globulin, Total: 2.3 g/dL (ref 1.5–4.5)
Glucose: 212 mg/dL — ABNORMAL HIGH (ref 65–99)
Potassium: 3.7 mmol/L (ref 3.5–5.2)
Sodium: 143 mmol/L (ref 134–144)
Total Protein: 6.8 g/dL (ref 6.0–8.5)

## 2019-09-16 LAB — LIPID PANEL
Chol/HDL Ratio: 4.5 ratio — ABNORMAL HIGH (ref 0.0–4.4)
Cholesterol, Total: 205 mg/dL — ABNORMAL HIGH (ref 100–199)
HDL: 46 mg/dL (ref >39)
LDL Chol Calc (NIH): 130 mg/dL — ABNORMAL HIGH (ref 0–99)
Triglycerides: 160 mg/dL — ABNORMAL HIGH (ref 0–149)
VLDL Cholesterol Cal: 29 mg/dL (ref 5–40)

## 2019-09-20 ENCOUNTER — Encounter: Payer: Medicare Other | Admitting: *Deleted

## 2019-09-20 ENCOUNTER — Other Ambulatory Visit: Payer: Self-pay

## 2019-09-20 ENCOUNTER — Ambulatory Visit: Payer: Medicare Other | Admitting: Internal Medicine

## 2019-09-20 ENCOUNTER — Encounter: Payer: Self-pay | Admitting: Internal Medicine

## 2019-09-20 VITALS — BP 170/84 | HR 93 | Ht 65.0 in | Wt 186.2 lb

## 2019-09-20 DIAGNOSIS — I447 Left bundle-branch block, unspecified: Secondary | ICD-10-CM | POA: Diagnosis not present

## 2019-09-20 DIAGNOSIS — Z95810 Presence of automatic (implantable) cardiac defibrillator: Secondary | ICD-10-CM

## 2019-09-20 DIAGNOSIS — I428 Other cardiomyopathies: Secondary | ICD-10-CM

## 2019-09-20 NOTE — Patient Instructions (Addendum)
Medication Instructions:  Your physician recommends that you continue on your current medications as directed. Please refer to the Current Medication list given to you today.  Labwork: None ordered.  Testing/Procedures: None ordered.  Follow-Up: Your physician wants you to follow-up in: as needed with Dr. Taylor.      Any Other Special Instructions Will Be Listed Below (If Applicable).  If you need a refill on your cardiac medications before your next appointment, please call your pharmacy.   

## 2019-09-20 NOTE — Progress Notes (Signed)
HPI Sara Graves returns today for followup of her ICD. She is a pleasant 78 yo woman with an ICM, chronic systolic heart failure, LBBB, s/p BIV ICD insertion approx 3 months ago. She did well but notes that the last few weeks she has been more sob. Her BP has been elevated. She denies medical or dietary non-compliance. She has a blister that burst on the left lower leg.  Allergies  Allergen Reactions  . Rosiglitazone Maleate Anaphylaxis and Swelling  . Morphine Other (See Comments)    SEVERE HYPOTENSION   . Cephalexin Diarrhea  . Sulfa Antibiotics Diarrhea and Nausea And Vomiting  . Tramadol Other (See Comments)    Unknown reaction  . Elavil [Amitriptyline] Nausea Only  . Tramadol-Acetaminophen Rash     Current Outpatient Medications  Medication Sig Dispense Refill  . aspirin EC 81 MG tablet Take 81 mg by mouth daily.    . busPIRone (BUSPAR) 7.5 MG tablet Take 7.5 mg by mouth 2 (two) times daily.    . Calcium Carb-Cholecalciferol (CALCIUM 600 + D PO) Take 1 tablet by mouth daily.     . clopidogrel (PLAVIX) 75 MG tablet Take 1 tablet (75 mg total) by mouth daily. 90 tablet 1  . FLUoxetine (PROZAC) 20 MG tablet Take 20 mg by mouth daily.    Marland Kitchen glucose blood (ONETOUCH VERIO) test strip 1 each 3 (three) times daily.     . hydrALAZINE (APRESOLINE) 25 MG tablet Take 1 tablet (25 mg total) by mouth 3 (three) times daily. 90 tablet 2  . insulin lispro (HUMALOG) 100 UNIT/ML injection Inject 2-6 Units into the skin See admin instructions. For use with V - GO 20 Insulin Delivery Device: inject 6 units subcutaneously three times daily before meals, inject 2 units at bedtime if eating a bedtime snack    . Investigational - Study Medication Take 1 tablet by mouth daily. Study name: dapagliflozin/metformin ER 10/1000mg  Additional study details: This is a Drug Study medication from Dr. Nadyne Coombes at [facility]. Patient started taking this medication on 04/22/18 and is unsure of how long she  is to take this medication for. Per patient, she is on this medication as part of a Heart and Diabetes drug study. 1 each PRN  . isosorbide mononitrate (IMDUR) 60 MG 24 hr tablet Take 60 mg by mouth daily.    Marland Kitchen labetalol (NORMODYNE) 100 MG tablet Take 100 mg by mouth 2 (two) times daily.    Marland Kitchen levothyroxine (SYNTHROID, LEVOTHROID) 50 MCG tablet Take 50 mcg by mouth daily before breakfast.     . losartan (COZAAR) 25 MG tablet Take 1 tablet (25 mg total) by mouth daily. 90 tablet 1  . metoCLOPramide (REGLAN) 5 MG tablet Take 1 tablet by mouth 3 (three) times daily.    . nitroGLYCERIN (NITROSTAT) 0.4 MG SL tablet Place 1 tablet (0.4 mg total) under the tongue every 5 (five) minutes x 3 doses as needed for chest pain. 25 tablet 2  . ONETOUCH VERIO test strip 1 each 3 (three) times daily.   5  . pantoprazole (PROTONIX) 40 MG tablet Take 1 tablet (40 mg total) by mouth 2 (two) times daily. (Patient taking differently: Take 40 mg by mouth daily as needed (acid reflux). ) 60 tablet 1  . torsemide (DEMADEX) 20 MG tablet Take 1 tablet (20 mg total) by mouth every other day. Can increase to  Daily if leg swelling 60 tablet 3  . vitamin B-12 (CYANOCOBALAMIN) 1000 MCG tablet  Take 1,000 mcg by mouth daily.     . Vitamin D, Ergocalciferol, (DRISDOL) 50000 units CAPS capsule Take 50,000 Units by mouth every Thursday.      No current facility-administered medications for this visit.      Past Medical History:  Diagnosis Date  . Acute combined systolic and diastolic heart failure ([place]) 04/25/2018  . Anxiety   . Arthritis    "hands" (12/29/2017)  . Basal cell carcinoma of nose    removed  . Bursitis of left shoulder   . Cat scratch fever    Late 90s  . Coronary artery disease   . Depression   . Diabetes mellitus, type II, insulin dependent ([place])   . GERD (gastroesophageal reflux disease)   . H. pylori infection 2008 and 1998   treated  . Hyperlipidemia   . Hypertension   . Hypothyroidism   . Low oxygen  saturation   . Multinodular goiter   . Rotator cuff tear, left recurrent   . Urge urinary incontinence   . UTI (lower urinary tract infection) 05/2016    ROS:   All systems reviewed and negative except as noted in the HPI.   Past Surgical History:  Procedure Laterality Date  . APPENDECTOMY  AGE 58  . BACK SURGERY    . BASAL CELL CARCINOMA EXCISION     "nose"  . BIV ICD INSERTION CRT-D N/A 06/21/2019   Procedure: BIV ICD INSERTION CRT-D;  Surgeon: Evans Lance, MD;  Location: [facility];  Service: Cardiovascular;  Laterality: N/A;  . BLADDER NECK SUSPENSION  1970's  . BUNIONECTOMY WITH HAMMERTOE RECONSTRUCTION Bilateral   . CARPAL TUNNEL RELEASE Right 05/2017  . COLONOSCOPY W/ POLYPECTOMY    . CORONARY ANGIOPLASTY WITH STENT PLACEMENT  12/29/2017  . CORONARY BALLOON ANGIOPLASTY N/A 02/08/2019   Procedure: CORONARY BALLOON ANGIOPLASTY;  Surgeon: Adrian Prows, MD;  Location: [facility];  Service: Cardiovascular;  Laterality: N/A;  . CORONARY STENT INTERVENTION N/A 12/29/2017   Procedure: CORONARY STENT INTERVENTION;  Surgeon: Nigel Mormon, MD;  Location: [facility];  Service: Cardiovascular;  Laterality: N/A;  . CORONARY STENT INTERVENTION N/A 04/26/2018   Procedure: CORONARY STENT INTERVENTION;  Surgeon: Nigel Mormon, MD;  Location: [facility];  Service: Cardiovascular;  Laterality: N/A;  . CORONARY STENT INTERVENTION N/A 02/08/2019   Procedure: CORONARY STENT INTERVENTION;  Surgeon: Adrian Prows, MD;  Location: [facility];  Service: Cardiovascular;  Laterality: N/A;  . ESOPHAGOGASTRODUODENOSCOPY ENDOSCOPY    . FORAMINAL DECOMPRESSION AT L2 TO THE SACRUM  01-05-2008   L2  -  S1  . GANGLION CYST EXCISION Left 01/17/2009   ring finger  . IMPLANTATION PERMANENT SPINAL CORD STIMULATOR  06-15-2008   JUNE 2013--  BATTERY CHANGE  . JOINT REPLACEMENT    . LEFT HEART CATH AND CORONARY ANGIOGRAPHY N/A 04/26/2018   Procedure: LEFT HEART  CATH AND CORONARY ANGIOGRAPHY;  Surgeon: Nigel Mormon, MD;  Location: [facility];  Service: Cardiovascular;  Laterality: N/A;  . LEFT HEART CATH AND CORONARY ANGIOGRAPHY N/A 02/08/2019   Procedure: LEFT HEART CATH AND CORONARY ANGIOGRAPHY;  Surgeon: Adrian Prows, MD;  Location: [facility];  Service: Cardiovascular;  Laterality: N/A;  . LIPOMA EXCISION Right    RIGHT ELBOW  . METATARSAL HEAD EXCISION Right 06/16/2018   Procedure: right 5th metatarsal excision, a mini c-arm;  Surgeon: Trula Slade, DPM;  Location: [facility];  Service: Podiatry;  Laterality: Right;  anesthesia can do block  . RIGHT/LEFT HEART CATH AND CORONARY ANGIOGRAPHY N/A 12/29/2017   Procedure: RIGHT/LEFT HEART CATH AND CORONARY ANGIOGRAPHY;  Surgeon: Nigel Mormon, MD;  Location: [facility];  Service: Cardiovascular;  Laterality: N/A;  . SHOULDER OPEN ROTATOR CUFF REPAIR  09/07/2012   Procedure: ROTATOR CUFF REPAIR SHOULDER OPEN;  Surgeon: Magnus Sinning, MD;  Location: [facility];  Service: Orthopedics;  Laterality: Left;  OPEN ANTERIOR ACROMIONECTOMY AND ROTATOR CUFF REPAIR ON LEFT   . SHOULDER OPEN ROTATOR CUFF REPAIR Left 12/28/2012   Procedure: ROTATOR CUFF REPAIR SHOULDER OPEN;  Surgeon: Magnus Sinning, MD;  Location: [facility];  Service: Orthopedics;  Laterality: Left;  OPEN SHOULDER ROTATOR CUFF REPAIR ON LEFT  WITH ANTERIOR ACROMINECTOMY   . SHOULDER OPEN ROTATOR CUFF REPAIR Right 03/28/2003   RIGHT SHOULDER  DEGENERATIVE AC JOINT AND RC TEAR  . SPINAL CORD STIMULATOR BATTERY EXCHANGE N/A 07/03/2016   Procedure: SPINAL CORD STIMULATOR BATTERY PLACMENT;  Surgeon: Melina Schools, MD;  Location: [facility];  Service: Orthopedics;  Laterality: N/A;  . TONSILLECTOMY  AGE 72  . 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 VAGINAL HYSTERECTOMY  1970's     Family History  Problem Relation Age of Onset  . Cancer Mother        breast  .  Heart Problems Mother   . Prostate cancer Father   . Muscular dystrophy Son   . Cancer Maternal Grandmother        breast  . Heart disease Maternal Grandfather   . Breast cancer Sister      Social History   Socioeconomic History  . Marital status: Married    Spouse name: Not on file  . Number of children: 2  . Years of education: 11th  . Highest education level: Not on file  Occupational History  . Occupation: Retired  Scientific laboratory technician  . Financial resource strain: Not on file  . Food insecurity    Worry: Not on file    Inability: Not on file  . Transportation needs    Medical: Not on file    Non-medical: Not on file  Tobacco Use  . Smoking status: Never Smoker  . Smokeless tobacco: Never Used  Substance and Sexual Activity  . Alcohol use: No  . Drug use: No  . Sexual activity: Yes  Lifestyle  . Physical activity    Days per week: Not on file    Minutes per session: Not on file  . Stress: Not on file  Relationships  . Social Herbalist on phone: Not on file    Gets together: Not on file    Attends religious service: Not on file    Active member of club or organization: Not on file    Attends meetings of clubs or organizations: Not on file    Relationship status: Not on file  . Intimate partner violence    Fear of current or ex partner: Not on file    Emotionally abused: Not on file    Physically abused: Not on file    Forced sexual activity: Not on file  Other Topics Concern  . Not on file  Social History Narrative   Pt lives with her husband and 1 grown son in [place]. Younger son has muscular dystrophy. Having a difficult time as primary caregiver to son who is not doing well.  Declined respite care/placement.   Caffeine  Use: none; very little     BP (!) 170/84   Pulse 93   Ht 5\' 5"  (1.651 m)   Wt 186 lb 3.2 oz (84.5 kg)   SpO2 98%   BMI 30.99 kg/m   Physical Exam:  Well appearing 78 yo woman, NAD HEENT: Unremarkable Neck:  6 cm JVD, no  thyromegally Lymphatics:  No adenopathy Back:  No CVA tenderness Lungs:  Clear with no wheezes HEART:  Regula r rate rhythm, no murmurs, no rubs, no clicks Abd:  soft, positive bowel sounds, no organomegally, no rebound, no guarding Ext:  2 plus pulses, no edema, no cyanosis, no clubbing Skin:  No rashes no nodules Neuro:  CN II through XII intact, motor grossly intact  EKG - nsr with biv pacing  DEVICE  Normal device function.  See PaceArt for details.   Assess/Plan: 1. Chronic systolic heart failure - her symptoms are still class 2 and she has mild peripheral edema. She is encouraged to reduce her salt intake. 2. HTN - her SBP is up and she was started on Hydralazine.  3. ICD - her medtronic biv ICD is working normally and her outputs have been turned down. She will follow her device with [name] and I will be available as needed. 4. CAD - she is s/p MI and she denies anginal symptoms  Mikle [email or web address].D.

## 2019-09-22 ENCOUNTER — Telehealth: Payer: Self-pay

## 2019-09-22 ENCOUNTER — Other Ambulatory Visit: Payer: Self-pay

## 2019-09-22 MED ORDER — ROSUVASTATIN CALCIUM 40 MG PO TABS: 40.0000 mg | ORAL_TABLET | Freq: Every day | ORAL | 3 refills | Status: DC

## 2019-09-22 MED ORDER — EZETIMIBE 10 MG PO TABS: 10.0000 mg | ORAL_TABLET | Freq: Every day | ORAL | 3 refills | Status: DC

## 2019-09-22 NOTE — Progress Notes (Signed)
Spoke with patient is not taking zetia or crestor. Patient has requested 90 day supply of both medications be sent to pharmacy. RX's sent in to her pharmacy.

## 2019-09-22 NOTE — Telephone Encounter (Signed)
-----   Message from Miquel Dunn, NP sent at 09/20/2019  1:54 PM EST ----- Kidney function is better than her previous results with Endocrinology. Her lipids are markedly elevated. I do not see the crestor and zetia on her med list. Is she still taking it? If not she will need a script for it.

## 2019-09-22 NOTE — Telephone Encounter (Signed)
Pt is taking crestor at night; She says that she is still sob even with the medication change that you made

## 2019-09-27 NOTE — Telephone Encounter (Signed)
Pt aware.

## 2019-09-28 ENCOUNTER — Ambulatory Visit: Payer: Medicare Other | Admitting: Cardiology

## 2019-09-28 ENCOUNTER — Encounter: Payer: Self-pay | Admitting: Cardiology

## 2019-09-28 ENCOUNTER — Other Ambulatory Visit: Payer: Self-pay

## 2019-09-28 VITALS — BP 147/79 | HR 89 | Temp 98.0°F | Ht 65.0 in | Wt 187.2 lb

## 2019-09-28 DIAGNOSIS — Z95810 Presence of automatic (implantable) cardiac defibrillator: Secondary | ICD-10-CM | POA: Diagnosis not present

## 2019-09-28 DIAGNOSIS — I5043 Acute on chronic combined systolic (congestive) and diastolic (congestive) heart failure: Secondary | ICD-10-CM | POA: Diagnosis not present

## 2019-09-28 DIAGNOSIS — I25118 Atherosclerotic heart disease of native coronary artery with other forms of angina pectoris: Secondary | ICD-10-CM

## 2019-09-28 DIAGNOSIS — I129 Hypertensive chronic kidney disease with stage 1 through stage 4 chronic kidney disease, or unspecified chronic kidney disease: Secondary | ICD-10-CM

## 2019-09-28 DIAGNOSIS — I255 Ischemic cardiomyopathy: Secondary | ICD-10-CM

## 2019-09-28 DIAGNOSIS — Z4502 Encounter for adjustment and management of automatic implantable cardiac defibrillator: Secondary | ICD-10-CM

## 2019-09-28 DIAGNOSIS — R0902 Hypoxemia: Secondary | ICD-10-CM

## 2019-09-28 DIAGNOSIS — E1122 Type 2 diabetes mellitus with diabetic chronic kidney disease: Secondary | ICD-10-CM

## 2019-09-28 DIAGNOSIS — N183 Chronic kidney disease, stage 3 unspecified: Secondary | ICD-10-CM

## 2019-09-28 HISTORY — DX: Encounter for adjustment and management of automatic implantable cardiac defibrillator: Z45.02

## 2019-09-28 MED ORDER — METOLAZONE 5 MG PO TABS: 5.0000 mg | ORAL_TABLET | ORAL | 0 refills | Status: DC

## 2019-09-28 MED ORDER — HYDRALAZINE HCL 50 MG PO TABS: 50.0000 mg | ORAL_TABLET | Freq: Three times a day (TID) | ORAL | 2 refills | Status: DC

## 2019-09-28 NOTE — Progress Notes (Signed)
Primary Physician:  [name], PA-C   [name], female    DOB: [birth year], 78 y.o.   MRN: [id number]  Subjective:    Chief Complaint  Patient presents with  . Hypertension  . Follow-up    HPI: Sara Graves  is a 78 y.o. female  with hypertension, uncontrolled type 2 DM, CAD s/p prior LAD and ramus PCI, HFpEF, h/o Rt leg cellulitis and metatarsal fracture, managed by Podiatry. Had admission to hospital for CHF on 01/23/2019 due to marked dyspnea and discharged on 2L [place] O2. Echo revealed new decrease in LVEF and underwent coronary angiogram on 01/27/19 and PCI to LAD (new stent) and balloon PTCA to IST RI lesion.   She underwent echocardiogram on 05/19/19 that continued to reveal depressed LVEF of 20-25%.  She was evaluated by Dr. Lovena Le and underwent BiV ICD implantation on 06/21/2019.  At her office visit 4 weeks ago, blood pressure was markedly elevated.  She was started on hydralazine.  She has since had worsening dyspnea on exertion and leg edema.  She has been using torsemide daily and some days using extra dose.  She has had to use nitroglycerin 1 or 2 times since last seen by me that relieved her chest discomfort and wheezing sensation.  She is tearful on exam today as she does not feel well.  She continues to have significant issues with nausea/vomiting and diarrhea and is being followed by GI.  She is frustrated with her continued symptoms.  She has been seen by Dr. Lovena Le during the interim, ICD is functioning appropriately.  She reports that she has nausea/vomiting and diarrhea for the last several weeks. She is possibly having colonoscopy and endoscopy in November.  Past Medical History:  Diagnosis Date  . Acute combined systolic and diastolic heart failure ([place]) 04/25/2018  . Anxiety   . Arthritis    "hands" (12/29/2017)  . Basal cell carcinoma of nose    removed  . Bursitis of left shoulder   . BV ICD Medtronic Claria MRI CRTD 06/21/2019  . Cat  scratch fever    Late 90s  . Chronic combined systolic and diastolic CHF (congestive heart failure) ([place]) 06/08/2019  . Coronary artery disease   . Depression   . Diabetes mellitus, type II, insulin dependent ([place])   . Encounter for assessment of implantable cardioverter-defibrillator (ICD) 09/28/2019  . GERD (gastroesophageal reflux disease)   . H. pylori infection 2008 and 1998   treated  . Hyperlipidemia   . Hypertension   . Hypothyroidism   . Low oxygen saturation   . Multinodular goiter   . Rotator cuff tear, left recurrent   . Urge urinary incontinence   . UTI (lower urinary tract infection) 05/2016    Past Surgical History:  Procedure Laterality Date  . APPENDECTOMY  AGE 52  . BACK SURGERY    . BASAL CELL CARCINOMA EXCISION     "nose"  . BIV ICD INSERTION CRT-D N/A 06/21/2019   Procedure: BIV ICD INSERTION CRT-D;  Surgeon: Evans Lance, MD;  Location: [facility];  Service: Cardiovascular;  Laterality: N/A;  . BLADDER NECK SUSPENSION  1970's  . BUNIONECTOMY WITH HAMMERTOE RECONSTRUCTION Bilateral   . CARPAL TUNNEL RELEASE Right 05/2017  . COLONOSCOPY W/ POLYPECTOMY    . CORONARY ANGIOPLASTY WITH STENT PLACEMENT  12/29/2017  . CORONARY BALLOON ANGIOPLASTY N/A 02/08/2019   Procedure: CORONARY BALLOON ANGIOPLASTY;  Surgeon: Adrian Prows, MD;  Location: [facility];  Service: Cardiovascular;  Laterality: N/A;  . CORONARY STENT INTERVENTION N/A 12/29/2017   Procedure: CORONARY STENT INTERVENTION;  Surgeon: Nigel Mormon, MD;  Location: [facility];  Service: Cardiovascular;  Laterality: N/A;  . CORONARY STENT INTERVENTION N/A 04/26/2018   Procedure: CORONARY STENT INTERVENTION;  Surgeon: Nigel Mormon, MD;  Location: [facility];  Service: Cardiovascular;  Laterality: N/A;  . CORONARY STENT INTERVENTION N/A 02/08/2019   Procedure: CORONARY STENT INTERVENTION;  Surgeon: Adrian Prows, MD;  Location: [facility];  Service:  Cardiovascular;  Laterality: N/A;  . ESOPHAGOGASTRODUODENOSCOPY ENDOSCOPY    . FORAMINAL DECOMPRESSION AT L2 TO THE SACRUM  01-05-2008   L2  -  S1  . GANGLION CYST EXCISION Left 01/17/2009   ring finger  . IMPLANTATION PERMANENT SPINAL CORD STIMULATOR  06-15-2008   JUNE 2013--  BATTERY CHANGE  . JOINT REPLACEMENT    . LEFT HEART CATH AND CORONARY ANGIOGRAPHY N/A 04/26/2018   Procedure: LEFT HEART CATH AND CORONARY ANGIOGRAPHY;  Surgeon: Nigel Mormon, MD;  Location: [facility];  Service: Cardiovascular;  Laterality: N/A;  . LEFT HEART CATH AND CORONARY ANGIOGRAPHY N/A 02/08/2019   Procedure: LEFT HEART CATH AND CORONARY ANGIOGRAPHY;  Surgeon: Adrian Prows, MD;  Location: [facility];  Service: Cardiovascular;  Laterality: N/A;  . LIPOMA EXCISION Right    RIGHT ELBOW  . METATARSAL HEAD EXCISION Right 06/16/2018   Procedure: right 5th metatarsal excision, a mini c-arm;  Surgeon: Trula Slade, DPM;  Location: [facility];  Service: Podiatry;  Laterality: Right;  anesthesia can do block  . RIGHT/LEFT HEART CATH AND CORONARY ANGIOGRAPHY N/A 12/29/2017   Procedure: RIGHT/LEFT HEART CATH AND CORONARY ANGIOGRAPHY;  Surgeon: Nigel Mormon, MD;  Location: [facility];  Service: Cardiovascular;  Laterality: N/A;  . SHOULDER OPEN ROTATOR CUFF REPAIR  09/07/2012   Procedure: ROTATOR CUFF REPAIR SHOULDER OPEN;  Surgeon: Magnus Sinning, MD;  Location: [facility];  Service: Orthopedics;  Laterality: Left;  OPEN ANTERIOR ACROMIONECTOMY AND ROTATOR CUFF REPAIR ON LEFT   . SHOULDER OPEN ROTATOR CUFF REPAIR Left 12/28/2012   Procedure: ROTATOR CUFF REPAIR SHOULDER OPEN;  Surgeon: Magnus Sinning, MD;  Location: [facility];  Service: Orthopedics;  Laterality: Left;  OPEN SHOULDER ROTATOR CUFF REPAIR ON LEFT  WITH ANTERIOR ACROMINECTOMY   . SHOULDER OPEN ROTATOR CUFF REPAIR Right 03/28/2003   RIGHT SHOULDER  DEGENERATIVE AC JOINT AND RC TEAR  .  SPINAL CORD STIMULATOR BATTERY EXCHANGE N/A 07/03/2016   Procedure: SPINAL CORD STIMULATOR BATTERY PLACMENT;  Surgeon: Melina Schools, MD;  Location: [facility];  Service: Orthopedics;  Laterality: N/A;  . TONSILLECTOMY  AGE 32  . 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 VAGINAL HYSTERECTOMY  1970's    Social History   Socioeconomic History  . Marital status: Married    Spouse name: Not on file  . Number of children: 2  . Years of education: 11th  . Highest education level: Not on file  Occupational History  . Occupation: Retired  Scientific laboratory technician  . Financial resource strain: Not on file  . Food insecurity    Worry: Not on file    Inability: Not on file  . Transportation needs    Medical: Not on file    Non-medical: Not on file  Tobacco Use  . Smoking status: Never Smoker  . Smokeless tobacco: Never Used  Substance and Sexual Activity  .  Alcohol use: No  . Drug use: No  . Sexual activity: Yes  Lifestyle  . Physical activity    Days per week: Not on file    Minutes per session: Not on file  . Stress: Not on file  Relationships  . Social Herbalist on phone: Not on file    Gets together: Not on file    Attends religious service: Not on file    Active member of club or organization: Not on file    Attends meetings of clubs or organizations: Not on file    Relationship status: Not on file  . Intimate partner violence    Fear of current or ex partner: Not on file    Emotionally abused: Not on file    Physically abused: Not on file    Forced sexual activity: Not on file  Other Topics Concern  . Not on file  Social History Narrative   Pt lives with her husband and 1 grown son in [place]. Younger son has muscular dystrophy. Having a difficult time as primary caregiver to son who is not doing well.  Declined respite care/placement.   Caffeine Use: none; very little    Review of Systems  Constitution: Negative for chills, decreased appetite,  malaise/fatigue and weight gain.  Cardiovascular: Positive for dyspnea on exertion (mild and improved). Negative for leg swelling and syncope.  Endocrine: Negative for cold intolerance.  Hematologic/Lymphatic: Does not bruise/bleed easily.  Musculoskeletal: Negative for joint swelling.  Gastrointestinal: Positive for diarrhea and nausea. Negative for abdominal pain, anorexia and change in bowel habit.  Neurological: Positive for loss of balance. Negative for headaches and light-headedness.  Psychiatric/Behavioral: Negative for depression and substance abuse.  All other systems reviewed and are negative.     Objective:  Blood pressure (!) 147/79, pulse 89, temperature 98 F (36.7 C), height '5\' 5"'$  (1.651 m), weight 187 lb 3.2 oz (84.9 kg), SpO2 92 %. Body mass index is 31.15 kg/m.   SIX MIN WALK 09/28/2019 09/28/2019  Supplimental Oxygen during Test? (L/min) No No  Laps 4 -  Partial Lap (in Meters) 100 -  Baseline Heartrate 101 -  Baseline SPO2 94 -  Heartrate 115 -  SPO2 82 -  Heartrate 111 -  SPO2 85 -  Stopped or Paused before Six Minutes Yes -  Other Symptoms at end of Exercise pt can not finish 6 min walk and only did 4 min -  Distance Completed 236 -  Provider Comments: pt can not finish 6 min walk and only did 4 min -   Six minute walk test Resting room air = 94% Room air walking = 82% Oxygen 2 Liters while ambulating = 92%   Physical Exam  Constitutional: She appears well-developed and well-nourished. No distress.  HENT:  Head: Atraumatic.  Eyes: Conjunctivae are normal.  Neck: Neck supple. No JVD present. No thyromegaly present.  Cardiovascular: Normal rate, regular rhythm, normal heart sounds and intact distal pulses. Exam reveals no gallop.  No murmur heard. Pulses:      Carotid pulses are 2+ on the right side with bruit and 2+ on the left side.      Femoral pulses are 2+ on the right side and 2+ on the left side.      Popliteal pulses are 0 on the right side  and 0 on the left side.       Dorsalis pedis pulses are 0 on the right side and 0 on the left  side.       Posterior tibial pulses are 0 on the right side and 0 on the left side.  Pulmonary/Chest: Effort normal and breath sounds normal.  Abdominal: Soft. Bowel sounds are normal.  Musculoskeletal: Normal range of motion.        General: No edema.  Neurological: She is alert.  Skin: Skin is warm and dry.  Psychiatric: She has a normal mood and affect.   Radiology: No results found.  Laboratory examination:  06/10/2019: Creatinine 1.8, potassium 3.9, EGFR 25, CMP otherwise normal.  04/22/2019: Creatinine 1.5, eGFR 33/38, potassium 4.6, CMP otherwise normal. HgbA1c 8.2%   07/08/2018: Cholesterol 166, triglycerides 137, HDL 55, LDL 84.  CMP Latest Ref Rng & Units 09/15/2019 06/17/2019 01/23/2019  Glucose 65 - 99 mg/dL 212(H) 213(H) 116(H)  BUN 8 - 27 mg/dL 21 22 18   Creatinine 0.57 - 1.00 mg/dL 1.08(H) 0.97 1.17(H)  Sodium 134 - 144 mmol/L 143 143 139  Potassium 3.5 - 5.2 mmol/L 3.7 4.2 3.6  Chloride 96 - 106 mmol/L 106 101 102  CO2 20 - 29 mmol/L 23 23 27   Calcium 8.7 - 10.3 mg/dL 9.4 10.6(H) 9.0  Total Protein 6.0 - 8.5 g/dL 6.8 - -  Total Bilirubin 0.0 - 1.2 mg/dL 0.6 - -  Alkaline Phos 39 - 117 IU/L 96 - -  AST 0 - 40 IU/L 9 - -  ALT 0 - 32 IU/L 10 - -   CBC Latest Ref Rng & Units 06/17/2019 01/21/2019 01/20/2019  WBC 3.4 - 10.8 x10E3/uL 6.7 4.0 4.5  Hemoglobin 11.1 - 15.9 g/dL 13.9 11.1(L) 11.1(L)  Hematocrit 34.0 - 46.6 % 43.7 34.3(L) 34.2(L)  Platelets 150 - 450 x10E3/uL 139(L) 112(L) 106(L)   Lipid Panel     Component Value Date/Time   CHOL 205 (H) 09/15/2019 1614   TRIG 160 (H) 09/15/2019 1614   HDL 46 09/15/2019 1614   CHOLHDL 4.5 (H) 09/15/2019 1614   CHOLHDL 3.9 03/25/2015 0325   VLDL 23 03/25/2015 0325   LDLCALC 130 (H) 09/15/2019 1614   LDLDIRECT 90 04/08/2012 1414   HEMOGLOBIN A1C Lab Results  Component Value Date   HGBA1C 7.8 (H) 01/20/2019   MPG 177.16  01/20/2019   TSH Recent Labs    01/20/19 2148  TSH 1.371    PRN Meds:. Medications Discontinued During This Encounter  Medication Reason  . hydrALAZINE (APRESOLINE) 25 MG tablet Dose change   Current Meds  Medication Sig  . aspirin EC 81 MG tablet Take 81 mg by mouth daily.  . busPIRone (BUSPAR) 7.5 MG tablet Take 7.5 mg by mouth 2 (two) times daily.  . Calcium Carb-Cholecalciferol (CALCIUM 600 + D PO) Take 1 tablet by mouth daily.   . clopidogrel (PLAVIX) 75 MG tablet Take 1 tablet (75 mg total) by mouth daily.  . diphenhydrAMINE (BENADRYL) 25 mg capsule Take 25 mg by mouth as needed.  . ezetimibe (ZETIA) 10 MG tablet Take 1 tablet (10 mg total) by mouth daily.  Marland Kitchen FLUoxetine (PROZAC) 20 MG tablet Take 20 mg by mouth daily.  . insulin lispro (HUMALOG) 100 UNIT/ML injection Inject 2-6 Units into the skin See admin instructions. For use with V - GO 20 Insulin Delivery Device: inject 6 units subcutaneously three times daily before meals, inject 2 units at bedtime if eating a bedtime snack  . Investigational - Study Medication Take 1 tablet by mouth daily. Study name: dapagliflozin/metformin ER 10/1000mg Additional study details: This is a Drug Study medication from Dr.  Nadyne Coombes at [facility]. Patient started taking this medication on 04/22/18 and is unsure of how long she is to take this medication for. Per patient, she is on this medication as part of a Heart and Diabetes drug study.  . isosorbide mononitrate (IMDUR) 60 MG 24 hr tablet Take 60 mg by mouth daily.  Marland Kitchen labetalol (NORMODYNE) 100 MG tablet Take 100 mg by mouth 2 (two) times daily.  Marland Kitchen levothyroxine (SYNTHROID, LEVOTHROID) 50 MCG tablet Take 50 mcg by mouth daily before breakfast.   . losartan (COZAAR) 25 MG tablet Take 1 tablet (25 mg total) by mouth daily.  . metoCLOPramide (REGLAN) 5 MG tablet Take 1 tablet by mouth 3 (three) times daily as needed.   . naproxen (NAPROSYN) 500 MG tablet Take 500 mg by mouth as needed.   . nitroGLYCERIN (NITROSTAT) 0.4 MG SL tablet Place 1 tablet (0.4 mg total) under the tongue every 5 (five) minutes x 3 doses as needed for chest pain.  . pantoprazole (PROTONIX) 40 MG tablet Take 1 tablet (40 mg total) by mouth 2 (two) times daily. (Patient taking differently: Take 40 mg by mouth daily as needed (acid reflux). )  . rosuvastatin (CRESTOR) 40 MG tablet Take 1 tablet (40 mg total) by mouth daily.  Marland Kitchen torsemide (DEMADEX) 20 MG tablet Take 1 tablet (20 mg total) by mouth every other day. Can increase to  Daily if leg swelling  . vitamin B-12 (CYANOCOBALAMIN) 1000 MCG tablet Take 1,000 mcg by mouth daily.   . Vitamin D, Ergocalciferol, (DRISDOL) 50000 units CAPS capsule Take 50,000 Units by mouth every Thursday.   . [DISCONTINUED] hydrALAZINE (APRESOLINE) 25 MG tablet Take 1 tablet (25 mg total) by mouth 3 (three) times daily.    Cardiac Studies:   Echocardiogram 05/20/2019: Left ventricle cavity is moderately dilated.  Severely depressed LV systolic function with global hypokinesis. LVEF 20-25%. Cannot exclude LV apical thrombus. Moderate concentric hypertrophy of the left ventricle. Normal global wall motion. Doppler evidence of grade II (pseudonormal) diastolic dysfunction, elevated LAP.  Left atrial cavity is severely dilated. Right atrial cavity is mildly dilated. Moderate (Grade II) aortic regurgitation. Moderate (Grade III) mitral regurgitation. Moderate tricuspid regurgitation. Estimated pulmonary artery systolic pressure is 65 mmHg. Small pericardial effusion.  No significant change compared to previous study on 01/21/2019.   Carotid artery duplex 12/02/2018: No hemodynamically significant arterial disease in the internal carotid artery bilaterally. Minimal plaque noted bilateral carotid arteries. Antegrade right vertebral artery flow. Antegrade left vertebral artery flow.  Coronary angiogram 02/08/2019: Distal left main 20% diffuse disease. Ostial LAD stent shows  diffuse 20% in-stent restenosis (3.0 x 15 mm resolute Onyx on 12/29/2017). Mid LAD and mid to distal LAD there are tandem lesions 80% and 95%. Ramus intermediate ostial 95 to 99% stenosis. Previously placed RI stent widely patent ( 2.0 x 12 mm resolute on 04/26/2018). 30 to 40% tandem lesions in the RCA. Cutting Balloon angioplasty of the ramus intermediate ostial high-grade stenosis, 99% reduced to 0% and Cutting Balloon angioplasty of the mid LAD followed by stenting with 2.5 x 32 mm Synergy DES for high-grade 90% stenosis reduced to 0% and TIMI-3 to TIMI-3 flow maintained in both lesions.  Assessment:     ICD-10-CM   1. Acute on chronic combined systolic and diastolic CHF, NYHA class 3 (HCC)  I50.43 For home use only DME oxygen  2. BV ICD Medtronic Claria MRI CRTD  Z95.810   3. Ischemic cardiomyopathy  I25.5   4. Coronary artery disease of  native artery of native heart with stable angina pectoris ([place])  I25.118   5. CKD stage 3 due to type 2 diabetes mellitus (HCC)  E11.22    N18.30   6. Hypoxemia  R09.02 For home use only DME oxygen    EKG 02/09/19: Sinus bradycardia at rate of 55 bpm, left bundle branch block.  No further analysis.  No significant change from prior EKG 02/08/2019.  Recommendations:   Patient is here for 4-week follow-up for hypertension.  Patient has had worsening shortness of breath and leg edema over the last several weeks.  She has symptoms of angina that are stable and has only had you nitroglycerin on a few occasions.  Suspect her symptoms are related to acute heart failure exacerbation. Continue with daily Torsemide, I will add Zaroxolyn 5 mg in the next 3 days.  She was counseled again on diet modifications to help with that.  6 minute walk test showed significant desaturations and her oxygen level with walking she is actually only able to ambulate 4 laps.  She will benefit from oxygen therapy and will order this.  Her BMP had shown some improvement in creatinine  levels compared to previous creatinine levels with endocrinology.  With increased dose of diuretics, will need close monitor her kidney function and will plan on repeating that her next appointment.  Will also likely need repeat echocardiogram, will plan to order this at next appointment.  Her blood pressure was markedly elevated in our office, but on recheck had improved.  I will further increase her hydralazine to 50 mg 3 times a day.  She has recently been evaluated by Dr. Lovena Le, ICD functioning appropriately.  We will take over management of this.  She continues to have significant she is a nausea and diarrhea that is being followed by GI.  Advised her to contact them regarding her continued issues.  I will see her back in 2 weeks for close follow-up.   *I have discussed this case with [name] and he personally examined the patient and participated in formulating the plan.*    Miquel Dunn, MSN, APRN, FNP-C [facility]. [place] Office: [phone] Fax: [phone]

## 2019-09-29 ENCOUNTER — Telehealth: Payer: Self-pay | Admitting: Cardiology

## 2019-09-29 ENCOUNTER — Telehealth: Payer: Self-pay

## 2019-09-29 NOTE — Telephone Encounter (Signed)
error 

## 2019-09-30 ENCOUNTER — Encounter: Payer: Self-pay | Admitting: Cardiology

## 2019-10-05 NOTE — Telephone Encounter (Signed)
error 

## 2019-10-10 ENCOUNTER — Other Ambulatory Visit: Payer: Self-pay

## 2019-10-10 ENCOUNTER — Ambulatory Visit: Payer: Medicare Other | Admitting: Cardiology

## 2019-10-10 ENCOUNTER — Encounter: Payer: Self-pay | Admitting: Cardiology

## 2019-10-10 VITALS — BP 158/73 | HR 76 | Temp 98.4°F | Ht 65.0 in | Wt 172.0 lb

## 2019-10-10 DIAGNOSIS — E1122 Type 2 diabetes mellitus with diabetic chronic kidney disease: Secondary | ICD-10-CM

## 2019-10-10 DIAGNOSIS — I25118 Atherosclerotic heart disease of native coronary artery with other forms of angina pectoris: Secondary | ICD-10-CM

## 2019-10-10 DIAGNOSIS — N183 Chronic kidney disease, stage 3 unspecified: Secondary | ICD-10-CM

## 2019-10-10 DIAGNOSIS — I129 Hypertensive chronic kidney disease with stage 1 through stage 4 chronic kidney disease, or unspecified chronic kidney disease: Secondary | ICD-10-CM

## 2019-10-10 DIAGNOSIS — I5042 Chronic combined systolic (congestive) and diastolic (congestive) heart failure: Secondary | ICD-10-CM | POA: Diagnosis not present

## 2019-10-10 DIAGNOSIS — Z9981 Dependence on supplemental oxygen: Secondary | ICD-10-CM

## 2019-10-10 DIAGNOSIS — I10 Essential (primary) hypertension: Secondary | ICD-10-CM

## 2019-10-10 NOTE — Progress Notes (Signed)
Primary Physician:  [name], PA-C   Patient ID: Tito Dine, female    DOB: September 04, 1941, 78 y.o.   MRN: [id number]  Subjective:    Chief Complaint  Patient presents with  . Hypertension  . Coronary Artery Disease    HPI: CECELY RENGEL  is a 78 y.o. female  with hypertension, uncontrolled type 2 DM, CAD s/p prior LAD and ramus PCI, HFpEF, h/o Rt leg cellulitis and metatarsal fracture, managed by Podiatry. Had admission to hospital for CHF on 01/23/2019 due to marked dyspnea and discharged on 2L [place] O2. Echo revealed new decrease in LVEF and underwent coronary angiogram on 01/27/19 and PCI to LAD (new stent) and balloon PTCA to IST RI lesion.   She underwent echocardiogram on 05/19/19 that continued to reveal depressed LVEF of 20-25%.  She was evaluated by Dr. Lovena Le and underwent BiV ICD implantation on 06/21/2019.  She was last seen 2 weeks ago for acute on chronic heart failure. She was started on 02 therapy in view of marked dyspnea and hypoxemia on exertion, Metolazone for 3 days, and hydralazine was increased to 50 mg TID. She reports that she has had marked improvement in symptoms. Her energy has greatly improved and leg edema has improved. She has not had to use any nitroglycerin. She has even had improvement in nausea and diarrhea.    Past Medical History:  Diagnosis Date  . Acute combined systolic and diastolic heart failure ([place]) 04/25/2018  . Anxiety   . Arthritis    "hands" (12/29/2017)  . Basal cell carcinoma of nose    removed  . Bursitis of left shoulder   . BV ICD Medtronic [place] MRI CRTD 06/21/2019  . Cat scratch fever    Late 90s  . Chronic combined systolic and diastolic CHF (congestive heart failure) ([place]) 06/08/2019  . Coronary artery disease   . Depression   . Diabetes mellitus, type II, insulin dependent ([place])   . Encounter for assessment of implantable cardioverter-defibrillator (ICD) 09/28/2019  . GERD (gastroesophageal reflux disease)   . H.  pylori infection 2008 and 1998   treated  . Hyperlipidemia   . Hypertension   . Hypothyroidism   . Low oxygen saturation   . Multinodular goiter   . Rotator cuff tear, left recurrent   . Urge urinary incontinence   . UTI (lower urinary tract infection) 05/2016    Past Surgical History:  Procedure Laterality Date  . APPENDECTOMY  AGE 81  . BACK SURGERY    . BASAL CELL CARCINOMA EXCISION     "nose"  . BIV ICD INSERTION CRT-D N/A 06/21/2019   Procedure: BIV ICD INSERTION CRT-D;  Surgeon: Evans Lance, MD;  Location: [facility];  Service: Cardiovascular;  Laterality: N/A;  . BLADDER NECK SUSPENSION  1970's  . BUNIONECTOMY WITH HAMMERTOE RECONSTRUCTION Bilateral   . CARPAL TUNNEL RELEASE Right 05/2017  . COLONOSCOPY W/ POLYPECTOMY    . CORONARY ANGIOPLASTY WITH STENT PLACEMENT  12/29/2017  . CORONARY BALLOON ANGIOPLASTY N/A 02/08/2019   Procedure: CORONARY BALLOON ANGIOPLASTY;  Surgeon: Adrian Prows, MD;  Location: [facility];  Service: Cardiovascular;  Laterality: N/A;  . CORONARY STENT INTERVENTION N/A 12/29/2017   Procedure: CORONARY STENT INTERVENTION;  Surgeon: Nigel Mormon, MD;  Location: [facility];  Service: Cardiovascular;  Laterality: N/A;  . CORONARY STENT INTERVENTION N/A 04/26/2018   Procedure: CORONARY STENT INTERVENTION;  Surgeon: Nigel Mormon, MD;  Location: [facility];  Service: Cardiovascular;  Laterality: N/A;  . CORONARY STENT INTERVENTION N/A 02/08/2019   Procedure: CORONARY STENT INTERVENTION;  Surgeon: Adrian Prows, MD;  Location: [facility];  Service: Cardiovascular;  Laterality: N/A;  . ESOPHAGOGASTRODUODENOSCOPY ENDOSCOPY    . FORAMINAL DECOMPRESSION AT L2 TO THE SACRUM  01-05-2008   L2  -  S1  . GANGLION CYST EXCISION Left 01/17/2009   ring finger  . IMPLANTATION PERMANENT SPINAL CORD STIMULATOR  06-15-2008   JUNE 2013--  BATTERY CHANGE  . JOINT REPLACEMENT    . LEFT HEART CATH AND CORONARY ANGIOGRAPHY N/A  04/26/2018   Procedure: LEFT HEART CATH AND CORONARY ANGIOGRAPHY;  Surgeon: Nigel Mormon, MD;  Location: [facility];  Service: Cardiovascular;  Laterality: N/A;  . LEFT HEART CATH AND CORONARY ANGIOGRAPHY N/A 02/08/2019   Procedure: LEFT HEART CATH AND CORONARY ANGIOGRAPHY;  Surgeon: Adrian Prows, MD;  Location: [facility];  Service: Cardiovascular;  Laterality: N/A;  . LIPOMA EXCISION Right    RIGHT ELBOW  . METATARSAL HEAD EXCISION Right 06/16/2018   Procedure: right 5th metatarsal excision, a mini c-arm;  Surgeon: Trula Slade, DPM;  Location: [facility];  Service: Podiatry;  Laterality: Right;  anesthesia can do block  . RIGHT/LEFT HEART CATH AND CORONARY ANGIOGRAPHY N/A 12/29/2017   Procedure: RIGHT/LEFT HEART CATH AND CORONARY ANGIOGRAPHY;  Surgeon: Nigel Mormon, MD;  Location: [facility];  Service: Cardiovascular;  Laterality: N/A;  . SHOULDER OPEN ROTATOR CUFF REPAIR  09/07/2012   Procedure: ROTATOR CUFF REPAIR SHOULDER OPEN;  Surgeon: Magnus Sinning, MD;  Location: [facility];  Service: Orthopedics;  Laterality: Left;  OPEN ANTERIOR ACROMIONECTOMY AND ROTATOR CUFF REPAIR ON LEFT   . SHOULDER OPEN ROTATOR CUFF REPAIR Left 12/28/2012   Procedure: ROTATOR CUFF REPAIR SHOULDER OPEN;  Surgeon: Magnus Sinning, MD;  Location: [facility];  Service: Orthopedics;  Laterality: Left;  OPEN SHOULDER ROTATOR CUFF REPAIR ON LEFT  WITH ANTERIOR ACROMINECTOMY   . SHOULDER OPEN ROTATOR CUFF REPAIR Right 03/28/2003   RIGHT SHOULDER  DEGENERATIVE AC JOINT AND RC TEAR  . SPINAL CORD STIMULATOR BATTERY EXCHANGE N/A 07/03/2016   Procedure: SPINAL CORD STIMULATOR BATTERY PLACMENT;  Surgeon: Melina Schools, MD;  Location: [facility];  Service: Orthopedics;  Laterality: N/A;  . TONSILLECTOMY  AGE 55  . 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 VAGINAL HYSTERECTOMY  1970's    Social History   Socioeconomic History  .  Marital status: Married    Spouse name: Not on file  . Number of children: 2  . Years of education: 11th  . Highest education level: Not on file  Occupational History  . Occupation: Retired  Scientific laboratory technician  . Financial resource strain: Not on file  . Food insecurity    Worry: Not on file    Inability: Not on file  . Transportation needs    Medical: Not on file    Non-medical: Not on file  Tobacco Use  . Smoking status: Never Smoker  . Smokeless tobacco: Never Used  Substance and Sexual Activity  . Alcohol use: No  . Drug use: No  . Sexual activity: Yes  Lifestyle  . Physical activity    Days per week: Not on file    Minutes per session: Not on file  . Stress: Not on file  Relationships  . Social connections    Talks on phone: Not on file  Gets together: Not on file    Attends religious service: Not on file    Active member of club or organization: Not on file    Attends meetings of clubs or organizations: Not on file    Relationship status: Not on file  . Intimate partner violence    Fear of current or ex partner: Not on file    Emotionally abused: Not on file    Physically abused: Not on file    Forced sexual activity: Not on file  Other Topics Concern  . Not on file  Social History Narrative   Pt lives with her husband and 1 grown son in [place]. Younger son has muscular dystrophy. Having a difficult time as primary caregiver to son who is not doing well.  Declined respite care/placement.   Caffeine Use: none; very little    Review of Systems  Constitution: Negative for chills, decreased appetite, malaise/fatigue and weight gain.  Cardiovascular: Positive for dyspnea on exertion (mild and improved). Negative for chest pain, leg swelling, orthopnea and syncope.  Respiratory: Negative for cough.   Endocrine: Negative for cold intolerance.  Hematologic/Lymphatic: Does not bruise/bleed easily.  Musculoskeletal: Negative for joint swelling.  Gastrointestinal:  Negative for abdominal pain, anorexia, change in bowel habit, diarrhea and nausea.  Neurological: Positive for loss of balance. Negative for headaches and light-headedness.  Psychiatric/Behavioral: Negative for depression and substance abuse.  All other systems reviewed and are negative.     Objective:  Blood pressure (!) 158/73, pulse 76, temperature 98.4 F (36.9 C), height 5' 5"  (1.651 m), weight 172 lb (78 kg), SpO2 95 %. Body mass index is 28.62 kg/m.   Vitals with BMI 10/10/2019 10/10/2019 09/28/2019  Height - 5' 5"  -  Weight - 172 lbs -  BMI - 25.36 -  Systolic 644 034 742  Diastolic 73 90 79  Pulse 76 90 -      SIX MIN WALK 09/28/2019 09/28/2019  Supplimental Oxygen during Test? (L/min) No No  Laps 4 -  Partial Lap (in Meters) 100 -  Baseline Heartrate 101 -  Baseline SPO2 94 -  Heartrate 115 -  SPO2 82 -  Heartrate 111 -  SPO2 85 -  Stopped or Paused before Six Minutes Yes -  Other Symptoms at end of Exercise pt can not finish 6 min walk and only did 4 min -  Distance Completed 236 -  Provider Comments: pt can not finish 6 min walk and only did 4 min -   Six minute walk test Resting room air = 94% Room air walking = 82% Oxygen 2 Liters while ambulating = 92%   Physical Exam  Constitutional: She appears well-developed and well-nourished. No distress.  HENT:  Head: Atraumatic.  Eyes: Conjunctivae are normal.  Neck: Neck supple. No JVD present. No thyromegaly present.  Cardiovascular: Normal rate, regular rhythm, normal heart sounds and intact distal pulses. Exam reveals no gallop.  No murmur heard. Pulses:      Carotid pulses are 2+ on the right side with bruit and 2+ on the left side.      Femoral pulses are 2+ on the right side and 2+ on the left side.      Popliteal pulses are 0 on the right side and 0 on the left side.       Dorsalis pedis pulses are 0 on the right side and 0 on the left side.       Posterior tibial pulses are 0 on the right  side and  0 on the left side.  Pulmonary/Chest: Effort normal and breath sounds normal.  Abdominal: Soft. Bowel sounds are normal.  Musculoskeletal: Normal range of motion.        General: No edema.  Neurological: She is alert.  Skin: Skin is warm and dry.  Psychiatric: She has a normal mood and affect.   Radiology: No results found.  Laboratory examination:  06/10/2019: Creatinine 1.8, potassium 3.9, EGFR 25, CMP otherwise normal.  04/22/2019: Creatinine 1.5, eGFR 33/38, potassium 4.6, CMP otherwise normal. HgbA1c 8.2%   07/08/2018: Cholesterol 166, triglycerides 137, HDL 55, LDL 84.  CMP Latest Ref Rng & Units 09/15/2019 06/17/2019 01/23/2019  Glucose 65 - 99 mg/dL 212(H) 213(H) 116(H)  BUN 8 - 27 mg/dL 21 22 18   Creatinine 0.57 - 1.00 mg/dL 1.08(H) 0.97 1.17(H)  Sodium 134 - 144 mmol/L 143 143 139  Potassium 3.5 - 5.2 mmol/L 3.7 4.2 3.6  Chloride 96 - 106 mmol/L 106 101 102  CO2 20 - 29 mmol/L 23 23 27   Calcium 8.7 - 10.3 mg/dL 9.4 10.6(H) 9.0  Total Protein 6.0 - 8.5 g/dL 6.8 - -  Total Bilirubin 0.0 - 1.2 mg/dL 0.6 - -  Alkaline Phos 39 - 117 IU/L 96 - -  AST 0 - 40 IU/L 9 - -  ALT 0 - 32 IU/L 10 - -   CBC Latest Ref Rng & Units 06/17/2019 01/21/2019 01/20/2019  WBC 3.4 - 10.8 x10E3/uL 6.7 4.0 4.5  Hemoglobin 11.1 - 15.9 g/dL 13.9 11.1(L) 11.1(L)  Hematocrit 34.0 - 46.6 % 43.7 34.3(L) 34.2(L)  Platelets 150 - 450 x10E3/uL 139(L) 112(L) 106(L)   Lipid Panel     Component Value Date/Time   CHOL 205 (H) 09/15/2019 1614   TRIG 160 (H) 09/15/2019 1614   HDL 46 09/15/2019 1614   CHOLHDL 4.5 (H) 09/15/2019 1614   CHOLHDL 3.9 03/25/2015 0325   VLDL 23 03/25/2015 0325   LDLCALC 130 (H) 09/15/2019 1614   LDLDIRECT 90 04/08/2012 1414   HEMOGLOBIN A1C Lab Results  Component Value Date   HGBA1C 7.8 (H) 01/20/2019   MPG 177.16 01/20/2019   TSH Recent Labs    01/20/19 2148  TSH 1.371    PRN Meds:. Medications Discontinued During This Encounter  Medication Reason  . metolazone  (ZAROXOLYN) 5 MG tablet Discontinued by provider   Current Meds  Medication Sig  . aspirin EC 81 MG tablet Take 81 mg by mouth daily.  . busPIRone (BUSPAR) 7.5 MG tablet Take 7.5 mg by mouth 2 (two) times daily.  . Calcium Carb-Cholecalciferol (CALCIUM 600 + D PO) Take 1 tablet by mouth daily.   . clopidogrel (PLAVIX) 75 MG tablet Take 1 tablet (75 mg total) by mouth daily.  . diphenhydrAMINE (BENADRYL) 25 mg capsule Take 25 mg by mouth as needed.  . ezetimibe (ZETIA) 10 MG tablet Take 1 tablet (10 mg total) by mouth daily.  Marland Kitchen FLUoxetine (PROZAC) 20 MG tablet Take 20 mg by mouth daily.  Marland Kitchen glucose blood (ONETOUCH VERIO) test strip 1 each 3 (three) times daily.   . hydrALAZINE (APRESOLINE) 50 MG tablet Take 1 tablet (50 mg total) by mouth 3 (three) times daily.  . insulin lispro (HUMALOG) 100 UNIT/ML injection Inject 2-6 Units into the skin See admin instructions. For use with V - GO 20 Insulin Delivery Device: inject 6 units subcutaneously three times daily before meals, inject 2 units at bedtime if eating a bedtime snack  . Investigational - Study Medication Take  1 tablet by mouth daily. Study name: dapagliflozin/metformin ER 10/'1000mg'$  Additional study details: This is a Drug Study medication from Dr. Nadyne Coombes at [facility]. Patient started taking this medication on 04/22/18 and is unsure of how long she is to take this medication for. Per patient, she is on this medication as part of a Heart and Diabetes drug study.  . isosorbide mononitrate (IMDUR) 60 MG 24 hr tablet Take 60 mg by mouth daily.  Marland Kitchen labetalol (NORMODYNE) 100 MG tablet Take 100 mg by mouth 2 (two) times daily.  Marland Kitchen levothyroxine (SYNTHROID, LEVOTHROID) 50 MCG tablet Take 50 mcg by mouth daily before breakfast.   . losartan (COZAAR) 25 MG tablet Take 1 tablet (25 mg total) by mouth daily.  . metoCLOPramide (REGLAN) 5 MG tablet Take 1 tablet by mouth 3 (three) times daily as needed.   . naproxen (NAPROSYN) 500 MG tablet Take 500  mg by mouth as needed.  . nitroGLYCERIN (NITROSTAT) 0.4 MG SL tablet Place 1 tablet (0.4 mg total) under the tongue every 5 (five) minutes x 3 doses as needed for chest pain.  Glory Rosebush VERIO test strip 1 each 3 (three) times daily.   . pantoprazole (PROTONIX) 40 MG tablet Take 1 tablet (40 mg total) by mouth 2 (two) times daily. (Patient taking differently: Take 40 mg by mouth daily as needed (acid reflux). )  . rosuvastatin (CRESTOR) 40 MG tablet Take 1 tablet (40 mg total) by mouth daily.  Marland Kitchen torsemide (DEMADEX) 20 MG tablet Take 1 tablet (20 mg total) by mouth every other day. Can increase to  Daily if leg swelling  . vitamin B-12 (CYANOCOBALAMIN) 1000 MCG tablet Take 1,000 mcg by mouth daily.   . Vitamin D, Ergocalciferol, (DRISDOL) 50000 units CAPS capsule Take 50,000 Units by mouth every Thursday.   . [DISCONTINUED] metolazone (ZAROXOLYN) 5 MG tablet Take 1 tablet (5 mg total) by mouth as directed. Take 1 tablet daily for the next 3 days    Cardiac Studies:   Echocardiogram 05/20/2019: Left ventricle cavity is moderately dilated.  Severely depressed LV systolic function with global hypokinesis. LVEF 20-25%. Cannot exclude LV apical thrombus. Moderate concentric hypertrophy of the left ventricle. Normal global wall motion. Doppler evidence of grade II (pseudonormal) diastolic dysfunction, elevated LAP.  Left atrial cavity is severely dilated. Right atrial cavity is mildly dilated. Moderate (Grade II) aortic regurgitation. Moderate (Grade III) mitral regurgitation. Moderate tricuspid regurgitation. Estimated pulmonary artery systolic pressure is 65 mmHg. Small pericardial effusion.  No significant change compared to previous study on 01/21/2019.   Carotid artery duplex 12/02/2018: No hemodynamically significant arterial disease in the internal carotid artery bilaterally. Minimal plaque noted bilateral carotid arteries. Antegrade right vertebral artery flow. Antegrade left vertebral  artery flow.  Coronary angiogram 02/08/2019: Distal left main 20% diffuse disease. Ostial LAD stent shows diffuse 20% in-stent restenosis (3.0 x 15 mm resolute Onyx on 12/29/2017). Mid LAD and mid to distal LAD there are tandem lesions 80% and 95%. Ramus intermediate ostial 95 to 99% stenosis. Previously placed RI stent widely patent ( 2.0 x 12 mm resolute on 04/26/2018). 30 to 40% tandem lesions in the RCA. Cutting Balloon angioplasty of the ramus intermediate ostial high-grade stenosis, 99% reduced to 0% and Cutting Balloon angioplasty of the mid LAD followed by stenting with 2.5 x 32 mm Synergy DES for high-grade 90% stenosis reduced to 0% and TIMI-3 to TIMI-3 flow maintained in both lesions.  Assessment:     ICD-10-CM   1. Chronic combined systolic  and diastolic CHF (congestive heart failure) (HCC)  I50.42   2. Coronary artery disease of native artery of native heart with stable angina pectoris ([place])  I25.118   3. CKD stage 3 due to type 2 diabetes mellitus (HCC)  E11.22    N18.30   4. On supplemental oxygen therapy  Z99.81   5. Primary hypertension  I10     EKG 02/09/19: Sinus bradycardia at rate of 55 bpm, left bundle branch block.  No further analysis.  No significant change from prior EKG 02/08/2019.  Recommendations:   Patient is here for 2 week follow-up for acute on chronic heart failure exacerbation.  She has had marked improvement in her symptoms, leg edema has essentially resolved and she has had significant improvement in her energy levels.  She has not had to use any nitroglycerin.  We will continue with oxygen therapy.  She is now using torsemide 20 mg daily, will continue same.  She has any worsening dyspnea or leg edema, encouraged her to take 40 mg that day.  Encouraged her to be compliant with her diet.  We will need to repeat BMP level to follow-up on her kidney function given CKD stage III.  Her blood pressure has significantly improved with increasing her dose of  hydralazine, although in our office is still slightly elevated.  She has been monitoring at home and has been very well controlled in the 984K systolic.  We will continue with her current medications.  I will plan to see her back in 4 weeks for close follow-up.  She will need repeat echocardiogram in the near future, we will potentially order this and her next appointment.    Miquel Dunn, MSN, APRN, FNP-C [facility]. [place] Office: [phone] Fax: [phone]

## 2019-10-12 LAB — BASIC METABOLIC PANEL
BUN/Creatinine Ratio: 22 (ref 12–28)
BUN: 18 mg/dL (ref 8–27)
CO2: 21 mmol/L (ref 20–29)
Calcium: 9.5 mg/dL (ref 8.7–10.3)
Chloride: 107 mmol/L — ABNORMAL HIGH (ref 96–106)
Creatinine, Ser: 0.82 mg/dL (ref 0.57–1.00)
GFR calc Af Amer: 79 mL/min/{1.73_m2} (ref >59)
GFR calc non Af Amer: 69 mL/min/{1.73_m2} (ref >59)
Glucose: 131 mg/dL — ABNORMAL HIGH (ref 65–99)
Potassium: 4.2 mmol/L (ref 3.5–5.2)
Sodium: 144 mmol/L (ref 134–144)

## 2019-10-26 DIAGNOSIS — Z95810 Presence of automatic (implantable) cardiac defibrillator: Secondary | ICD-10-CM | POA: Diagnosis not present

## 2019-10-26 DIAGNOSIS — Z4502 Encounter for adjustment and management of automatic implantable cardiac defibrillator: Secondary | ICD-10-CM

## 2019-10-26 DIAGNOSIS — I5042 Chronic combined systolic (congestive) and diastolic (congestive) heart failure: Secondary | ICD-10-CM

## 2019-10-26 DIAGNOSIS — I255 Ischemic cardiomyopathy: Secondary | ICD-10-CM | POA: Diagnosis not present

## 2019-11-07 ENCOUNTER — Telehealth: Payer: Self-pay

## 2019-11-07 NOTE — Telephone Encounter (Signed)
Spoke with pt she says that she hasn't really had an appetite and she doesn't eat much; she will call if she needs [place]

## 2019-11-07 NOTE — Telephone Encounter (Signed)
-----   Message from Adrian Prows, MD sent at 11/06/2019  6:19 PM EST ----- Regarding: ICD Please let her know to be careful with her diet. CHF still present.  Tell them I am keeping them in my mind during the Holiday Season and God Bless. JG

## 2019-11-17 ENCOUNTER — Telehealth (INDEPENDENT_AMBULATORY_CARE_PROVIDER_SITE_OTHER): Payer: Medicare Other | Admitting: Cardiology

## 2019-11-17 ENCOUNTER — Encounter: Payer: Self-pay | Admitting: Cardiology

## 2019-11-17 VITALS — Ht 64.0 in | Wt 165.0 lb

## 2019-11-17 DIAGNOSIS — I25118 Atherosclerotic heart disease of native coronary artery with other forms of angina pectoris: Secondary | ICD-10-CM

## 2019-11-17 DIAGNOSIS — I5043 Acute on chronic combined systolic (congestive) and diastolic (congestive) heart failure: Secondary | ICD-10-CM | POA: Diagnosis not present

## 2019-11-17 DIAGNOSIS — N183 Chronic kidney disease, stage 3 unspecified: Secondary | ICD-10-CM

## 2019-11-17 DIAGNOSIS — Z9981 Dependence on supplemental oxygen: Secondary | ICD-10-CM | POA: Diagnosis not present

## 2019-11-17 DIAGNOSIS — E1122 Type 2 diabetes mellitus with diabetic chronic kidney disease: Secondary | ICD-10-CM

## 2019-11-17 DIAGNOSIS — I129 Hypertensive chronic kidney disease with stage 1 through stage 4 chronic kidney disease, or unspecified chronic kidney disease: Secondary | ICD-10-CM

## 2019-11-17 MED ORDER — METOLAZONE 5 MG PO TABS: 5.0000 mg | ORAL_TABLET | ORAL | 0 refills | Status: DC

## 2019-11-17 NOTE — Progress Notes (Signed)
Primary Physician:  [name], PA-C   [name], female    DOB: 1941/04/03, 78 y.o.   MRN: [id number]  Subjective:    Chief Complaint  Patient presents with  . Congestive Heart Failure   This visit type was conducted due to national recommendations for restrictions regarding the COVID-19 Pandemic (e.g. social distancing).  This format is felt to be most appropriate for this patient at this time.  All issues noted in this document were discussed and addressed.  No physical exam was performed (except for noted visual exam findings with Telehealth visits).  The patient has consented to conduct a Telehealth visit and understands insurance will be billed.   I discussed the limitations of evaluation and management by telemedicine and the availability of in person appointments. The patient expressed understanding and agreed to proceed.  Virtual Visit via Video Note is as below  I connected with@, on 11/17/19 at 1315 by telephone and verified that I am speaking with the correct person using two identifiers. Unable to perform video visit as patient did not have equipment.    I have discussed with the patient regarding the safety during COVID Pandemic and steps and precautions including social distancing with the patient.    HPI: SKYLA CHAMPAGNE  is a 78 y.o. female  with hypertension, uncontrolled type 2 DM, CAD s/p prior LAD and ramus PCI, HFpEF, h/o Rt leg cellulitis and metatarsal fracture, managed by Podiatry. Had admission to hospital for CHF on 01/23/2019 due to marked dyspnea and discharged on 2L [place] O2. Echo revealed new decrease in LVEF and underwent coronary angiogram on 01/27/19 and PCI to LAD (new stent) and balloon PTCA to IST RI lesion.   She underwent echocardiogram on 05/19/19 that continued to reveal depressed LVEF of 20-25%.  She was evaluated by Dr. Lovena Le and underwent BiV ICD implantation on 06/21/2019.  She was last seen 4 weeks ago for follow up on  CHF. She had significant improvement in her symptoms with starting home oxygen and also after completing 3 days of metolazone. She reports for the last 2 weeks, she has again developed worsening dyspnea, fatigue, and leg swelling. She is now taking Torsemide daily. States that she has not ate much the last few days due to decreased appetite. No nausea or diarrhea like before.  She has not had any recent episodes of chest pain. No nitroglycerin use.    Past Medical History:  Diagnosis Date  . Acute combined systolic and diastolic heart failure ([place]) 04/25/2018  . Anxiety   . Arthritis    "hands" (12/29/2017)  . Basal cell carcinoma of nose    removed  . Bursitis of left shoulder   . BV ICD Medtronic [place] MRI CRTD 06/21/2019  . Cat scratch fever    Late 90s  . Chronic combined systolic and diastolic CHF (congestive heart failure) ([place]) 06/08/2019  . Coronary artery disease   . Depression   . Diabetes mellitus, type II, insulin dependent ([place])   . Encounter for assessment of implantable cardioverter-defibrillator (ICD) 09/28/2019  . GERD (gastroesophageal reflux disease)   . H. pylori infection 2008 and 1998   treated  . Hyperlipidemia   . Hypertension   . Hypothyroidism   . Low oxygen saturation   . Multinodular goiter   . Rotator cuff tear, left recurrent   . Urge urinary incontinence   . UTI (lower urinary tract infection) 05/2016    Past Surgical History:  Procedure Laterality  Date  . APPENDECTOMY  AGE 16  . BACK SURGERY    . BASAL CELL CARCINOMA EXCISION     "nose"  . BIV ICD INSERTION CRT-D N/A 06/21/2019   Procedure: BIV ICD INSERTION CRT-D;  Surgeon: Evans Lance, MD;  Location: [facility];  Service: Cardiovascular;  Laterality: N/A;  . BLADDER NECK SUSPENSION  1970's  . BUNIONECTOMY WITH HAMMERTOE RECONSTRUCTION Bilateral   . CARPAL TUNNEL RELEASE Right 05/2017  . COLONOSCOPY W/ POLYPECTOMY    . CORONARY ANGIOPLASTY WITH STENT PLACEMENT  12/29/2017  .  CORONARY BALLOON ANGIOPLASTY N/A 02/08/2019   Procedure: CORONARY BALLOON ANGIOPLASTY;  Surgeon: Adrian Prows, MD;  Location: [facility];  Service: Cardiovascular;  Laterality: N/A;  . CORONARY STENT INTERVENTION N/A 12/29/2017   Procedure: CORONARY STENT INTERVENTION;  Surgeon: Nigel Mormon, MD;  Location: [facility];  Service: Cardiovascular;  Laterality: N/A;  . CORONARY STENT INTERVENTION N/A 04/26/2018   Procedure: CORONARY STENT INTERVENTION;  Surgeon: Nigel Mormon, MD;  Location: [facility];  Service: Cardiovascular;  Laterality: N/A;  . CORONARY STENT INTERVENTION N/A 02/08/2019   Procedure: CORONARY STENT INTERVENTION;  Surgeon: Adrian Prows, MD;  Location: [facility];  Service: Cardiovascular;  Laterality: N/A;  . ESOPHAGOGASTRODUODENOSCOPY ENDOSCOPY    . FORAMINAL DECOMPRESSION AT L2 TO THE SACRUM  01-05-2008   L2  -  S1  . GANGLION CYST EXCISION Left 01/17/2009   ring finger  . IMPLANTATION PERMANENT SPINAL CORD STIMULATOR  06-15-2008   JUNE 2013--  BATTERY CHANGE  . JOINT REPLACEMENT    . LEFT HEART CATH AND CORONARY ANGIOGRAPHY N/A 04/26/2018   Procedure: LEFT HEART CATH AND CORONARY ANGIOGRAPHY;  Surgeon: Nigel Mormon, MD;  Location: [facility];  Service: Cardiovascular;  Laterality: N/A;  . LEFT HEART CATH AND CORONARY ANGIOGRAPHY N/A 02/08/2019   Procedure: LEFT HEART CATH AND CORONARY ANGIOGRAPHY;  Surgeon: Adrian Prows, MD;  Location: [facility];  Service: Cardiovascular;  Laterality: N/A;  . LIPOMA EXCISION Right    RIGHT ELBOW  . METATARSAL HEAD EXCISION Right 06/16/2018   Procedure: right 5th metatarsal excision, a mini c-arm;  Surgeon: Trula Slade, DPM;  Location: [facility];  Service: Podiatry;  Laterality: Right;  anesthesia can do block  . RIGHT/LEFT HEART CATH AND CORONARY ANGIOGRAPHY N/A 12/29/2017   Procedure: RIGHT/LEFT HEART CATH AND CORONARY ANGIOGRAPHY;  Surgeon: Nigel Mormon, MD;  Location:  [facility];  Service: Cardiovascular;  Laterality: N/A;  . SHOULDER OPEN ROTATOR CUFF REPAIR  09/07/2012   Procedure: ROTATOR CUFF REPAIR SHOULDER OPEN;  Surgeon: Magnus Sinning, MD;  Location: [facility];  Service: Orthopedics;  Laterality: Left;  OPEN ANTERIOR ACROMIONECTOMY AND ROTATOR CUFF REPAIR ON LEFT   . SHOULDER OPEN ROTATOR CUFF REPAIR Left 12/28/2012   Procedure: ROTATOR CUFF REPAIR SHOULDER OPEN;  Surgeon: Magnus Sinning, MD;  Location: [facility];  Service: Orthopedics;  Laterality: Left;  OPEN SHOULDER ROTATOR CUFF REPAIR ON LEFT  WITH ANTERIOR ACROMINECTOMY   . SHOULDER OPEN ROTATOR CUFF REPAIR Right 03/28/2003   RIGHT SHOULDER  DEGENERATIVE AC JOINT AND RC TEAR  . SPINAL CORD STIMULATOR BATTERY EXCHANGE N/A 07/03/2016   Procedure: SPINAL CORD STIMULATOR BATTERY PLACMENT;  Surgeon: Melina Schools, MD;  Location: [facility];  Service: Orthopedics;  Laterality: N/A;  . TONSILLECTOMY  AGE 68  . 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  VAGINAL HYSTERECTOMY  1970's    Social History   Socioeconomic History  . Marital status: Married    Spouse name: Not on file  . Number of children: 2  . Years of education: 11th  . Highest education level: Not on file  Occupational History  . Occupation: Retired  Tobacco Use  . Smoking status: Never Smoker  . Smokeless tobacco: Never Used  Substance and Sexual Activity  . Alcohol use: No  . Drug use: No  . Sexual activity: Yes  Other Topics Concern  . Not on file  Social History Narrative   Pt lives with her husband and 1 grown son in [place]. Younger son has muscular dystrophy. Having a difficult time as primary caregiver to son who is not doing well.  Declined respite care/placement.   Caffeine Use: none; very little   Social Determinants of Health   Financial Resource Strain:   . Difficulty of Paying Living Expenses: Not on file  Food Insecurity:   . Worried About Paediatric nurse in the Last Year: Not on file  . Ran Out of Food in the Last Year: Not on file  Transportation Needs:   . Lack of Transportation (Medical): Not on file  . Lack of Transportation (Non-Medical): Not on file  Physical Activity:   . Days of Exercise per Week: Not on file  . Minutes of Exercise per Session: Not on file  Stress:   . Feeling of Stress : Not on file  Social Connections:   . Frequency of Communication with Friends and Family: Not on file  . Frequency of Social Gatherings with Friends and Family: Not on file  . Attends Religious Services: Not on file  . Active Member of Clubs or Organizations: Not on file  . Attends Archivist Meetings: Not on file  . Marital Status: Not on file  Intimate Partner Violence:   . Fear of Current or Ex-Partner: Not on file  . Emotionally Abused: Not on file  . Physically Abused: Not on file  . Sexually Abused: Not on file    Review of Systems  Constitution: Positive for malaise/fatigue. Negative for chills, decreased appetite and weight gain.  Cardiovascular: Positive for dyspnea on exertion (worsening) and leg swelling. Negative for chest pain, orthopnea and syncope.  Respiratory: Negative for cough.   Endocrine: Negative for cold intolerance.  Hematologic/Lymphatic: Does not bruise/bleed easily.  Musculoskeletal: Negative for joint swelling.  Gastrointestinal: Negative for abdominal pain, anorexia, change in bowel habit, diarrhea and nausea.  Neurological: Positive for loss of balance. Negative for headaches and light-headedness.  Psychiatric/Behavioral: Negative for depression and substance abuse.  All other systems reviewed and are negative.     Objective:  Height 5' 4" (1.626 m), weight 165 lb (74.8 kg). Body mass index is 28.32 kg/m.   Vitals with BMI 11/17/2019 10/10/2019 10/10/2019  Height 5' 4" - 5' 5"  Weight 165 lbs - 172 lbs  BMI 81.01 - 75.10  Systolic - 258 527  Diastolic - 73 90  Pulse - 76 90       SIX MIN WALK 09/28/2019 09/28/2019  Supplimental Oxygen during Test? (L/min) No No  Laps 4 -  Partial Lap (in Meters) 100 -  Baseline Heartrate 101 -  Baseline SPO2 94 -  Heartrate 115 -  SPO2 82 -  Heartrate 111 -  SPO2 85 -  Stopped or Paused before Six Minutes Yes -  Other Symptoms at end of Exercise pt can not  finish 6 min walk and only did 4 min -  Distance Completed 236 -  Provider Comments: pt can not finish 6 min walk and only did 4 min -   Six minute walk test Resting room air = 94% Room air walking = 82% Oxygen 2 Liters while ambulating = 92%   Physical Exam  Constitutional: She appears well-developed and well-nourished. No distress.  HENT:  Head: Atraumatic.  Eyes: Conjunctivae are normal.  Neck: No JVD present. No thyromegaly present.  Cardiovascular: Normal rate, regular rhythm, normal heart sounds and intact distal pulses. Exam reveals no gallop.  No murmur heard. Pulses:      Carotid pulses are 2+ on the right side with bruit and 2+ on the left side.      Femoral pulses are 2+ on the right side and 2+ on the left side.      Popliteal pulses are 0 on the right side and 0 on the left side.       Dorsalis pedis pulses are 0 on the right side and 0 on the left side.       Posterior tibial pulses are 0 on the right side and 0 on the left side.  Pulmonary/Chest: Effort normal and breath sounds normal.  Abdominal: Soft. Bowel sounds are normal.  Musculoskeletal:        General: No edema. Normal range of motion.     Cervical back: Neck supple.  Neurological: She is alert.  Skin: Skin is warm and dry.  Psychiatric: She has a normal mood and affect.   Radiology: No results found.  Laboratory examination:  06/10/2019: Creatinine 1.8, potassium 3.9, EGFR 25, CMP otherwise normal.  04/22/2019: Creatinine 1.5, eGFR 33/38, potassium 4.6, CMP otherwise normal. HgbA1c 8.2%   07/08/2018: Cholesterol 166, triglycerides 137, HDL 55, LDL 84.  CMP Latest Ref Rng  & Units 10/11/2019 09/15/2019 06/17/2019  Glucose 65 - 99 mg/dL 131(H) 212(H) 213(H)  BUN 8 - 27 mg/dL _0 Creatinine 0.57 - 1.00 mg/dL 0.82 1.08(H) 0.97  Sodium 134 - 144 mmol/L 144 143 143  Potassium 3.5 - 5.2 mmol/L 4.2 3.7 4.2  Chloride 96 - 106 mmol/L 107(H) 106 101  CO2 20 - 29 mmol/L _1 Calcium 8.7 - 10.3 mg/dL 9.5 9.4 10.6(H)  Total Protein 6.0 - 8.5 g/dL - 6.8 -  Total Bilirubin 0.0 - 1.2 mg/dL - 0.6 -  Alkaline Phos 39 - 117 IU/L - 96 -  AST 0 - 40 IU/L - 9 -  ALT 0 - 32 IU/L - 10 -   CBC Latest Ref Rng & Units 06/17/2019 01/21/2019 01/20/2019  WBC 3.4 - 10.8 x10E3/uL 6.7 4.0 4.5  Hemoglobin 11.1 - 15.9 g/dL 13.9 11.1(L) 11.1(L)  Hematocrit 34.0 - 46.6 % 43.7 34.3(L) 34.2(L)  Platelets 150 - 450 x10E3/uL 139(L) 112(L) 106(L)   Lipid Panel     Component Value Date/Time   CHOL 205 (H) 09/15/2019 1614   TRIG 160 (H) 09/15/2019 1614   HDL 46 09/15/2019 1614   CHOLHDL 4.5 (H) 09/15/2019 1614   CHOLHDL 3.9 03/25/2015 0325   VLDL 23 03/25/2015 0325   LDLCALC 130 (H) 09/15/2019 1614   LDLDIRECT 90 04/08/2012 1414   HEMOGLOBIN A1C Lab Results  Component Value Date   HGBA1C 7.8 (H) 01/20/2019   MPG 177.16 01/20/2019   TSH Recent Labs    01/20/19 2148  TSH 1.371    PRN Meds:. There are no discontinued medications. Current Meds  Medication Sig  . aspirin EC 81 MG tablet Take 81 mg by mouth daily.  . busPIRone (BUSPAR) 7.5 MG tablet Take 7.5 mg by mouth 2 (two) times daily.  . Calcium Carb-Cholecalciferol (CALCIUM 600 + D PO) Take 1 tablet by mouth daily.   . clopidogrel (PLAVIX) 75 MG tablet Take 1 tablet (75 mg total) by mouth daily.  . diphenhydrAMINE (BENADRYL) 25 mg capsule Take 25 mg by mouth as needed.  . ezetimibe (ZETIA) 10 MG tablet Take 1 tablet (10 mg total) by mouth daily.  Marland Kitchen FLUoxetine (PROZAC) 20 MG tablet Take 20 mg by mouth daily.  . hydrALAZINE (APRESOLINE) 50 MG tablet Take 1 tablet (50 mg total) by mouth 3 (three) times daily.  .  insulin lispro (HUMALOG) 100 UNIT/ML injection Inject 2-6 Units into the skin See admin instructions. For use with V - GO 20 Insulin Delivery Device: inject 6 units subcutaneously three times daily before meals, inject 2 units at bedtime if eating a bedtime snack  . Investigational - Study Medication Take 1 tablet by mouth daily. Study name: dapagliflozin/metformin ER 10/1000mg Additional study details: This is a Drug Study medication from Dr. Nadyne Coombes at [facility]. Patient started taking this medication on 04/22/18 and is unsure of how long she is to take this medication for. Per patient, she is on this medication as part of a Heart and Diabetes drug study.  . isosorbide mononitrate (IMDUR) 60 MG 24 hr tablet Take 60 mg by mouth daily.  Marland Kitchen labetalol (NORMODYNE) 100 MG tablet Take 100 mg by mouth 2 (two) times daily.  Marland Kitchen levothyroxine (SYNTHROID, LEVOTHROID) 50 MCG tablet Take 50 mcg by mouth daily before breakfast.   . losartan (COZAAR) 25 MG tablet Take 1 tablet (25 mg total) by mouth daily.  . metoCLOPramide (REGLAN) 5 MG tablet Take 1 tablet by mouth 3 (three) times daily as needed.   . naproxen (NAPROSYN) 500 MG tablet Take 500 mg by mouth as needed.  . nitroGLYCERIN (NITROSTAT) 0.4 MG SL tablet Place 1 tablet (0.4 mg total) under the tongue every 5 (five) minutes x 3 doses as needed for chest pain.  . pantoprazole (PROTONIX) 40 MG tablet Take 1 tablet (40 mg total) by mouth 2 (two) times daily. (Patient taking differently: Take 40 mg by mouth daily as needed (acid reflux). )  . rosuvastatin (CRESTOR) 40 MG tablet Take 1 tablet (40 mg total) by mouth daily.  Marland Kitchen torsemide (DEMADEX) 20 MG tablet Take 1 tablet (20 mg total) by mouth every other day. Can increase to  Daily if leg swelling  . vitamin B-12 (CYANOCOBALAMIN) 1000 MCG tablet Take 1,000 mcg by mouth daily.   . Vitamin D, Ergocalciferol, (DRISDOL) 50000 units CAPS capsule Take 50,000 Units by mouth every Thursday.     Cardiac Studies:    Echocardiogram 05/20/2019: Left ventricle cavity is moderately dilated.  Severely depressed LV systolic function with global hypokinesis. LVEF 20-25%. Cannot exclude LV apical thrombus. Moderate concentric hypertrophy of the left ventricle. Normal global wall motion. Doppler evidence of grade II (pseudonormal) diastolic dysfunction, elevated LAP.  Left atrial cavity is severely dilated. Right atrial cavity is mildly dilated. Moderate (Grade II) aortic regurgitation. Moderate (Grade III) mitral regurgitation. Moderate tricuspid regurgitation. Estimated pulmonary artery systolic pressure is 65 mmHg. Small pericardial effusion.  No significant change compared to previous study on 01/21/2019.   Carotid artery duplex 12/02/2018: No hemodynamically significant arterial disease in the internal carotid artery bilaterally. Minimal plaque noted bilateral carotid arteries. Antegrade right  vertebral artery flow. Antegrade left vertebral artery flow.  Coronary angiogram 02/08/2019: Distal left main 20% diffuse disease. Ostial LAD stent shows diffuse 20% in-stent restenosis (3.0 x 15 mm resolute Onyx on 12/29/2017). Mid LAD and mid to distal LAD there are tandem lesions 80% and 95%. Ramus intermediate ostial 95 to 99% stenosis. Previously placed RI stent widely patent ( 2.0 x 12 mm resolute on 04/26/2018). 30 to 40% tandem lesions in the RCA. Cutting Balloon angioplasty of the ramus intermediate ostial high-grade stenosis, 99% reduced to 0% and Cutting Balloon angioplasty of the mid LAD followed by stenting with 2.5 x 32 mm Synergy DES for high-grade 90% stenosis reduced to 0% and TIMI-3 to TIMI-3 flow maintained in both lesions.  Assessment:     ICD-10-CM   1. Acute on chronic combined systolic and diastolic heart failure (HCC)  I50.43 PCV ECHOCARDIOGRAM COMPLETE  2. Coronary artery disease of native artery of native heart with stable angina pectoris ([place])  I25.118   3. CKD stage 3 due to type 2  diabetes mellitus (HCC)  E11.22    N18.30   4. On supplemental oxygen therapy  Z99.81     EKG 02/09/19: Sinus bradycardia at rate of 55 bpm, left bundle branch block.  No further analysis.  No significant change from prior EKG 02/08/2019.  Recommendations:   Patient is tearful on our virtual visit today due to symptoms again worsening. She is having decreased energy, worsening dyspnea, and leg swelling progressively over the last few weeks. Recent ICD remote transmission suggest continued CHF. I will give her Metolazone 5 mg daily for the next 3 days to improve diuresis as she has previously responded well to this. Continue with daily torsemide. Will also order echocardiogram for follow up. Continue to be careful with her diet.    She has not recently had to use any nitroglycerin for angina symptoms. Will continue with aggressive medical management. Encouraged continued home oxygen use. She did not have a way to check her blood pressure today. Will evaluate next week. Continue with present medications. She will need repeat BMP that will be ordered her appointment next week for close follow up. Encouraged her to contact me sooner if needed.   Miquel Dunn, MSN, APRN, FNP-C [facility] [place] Office: [phone] Fax: [phone]

## 2019-11-24 ENCOUNTER — Other Ambulatory Visit: Payer: Self-pay

## 2019-11-24 ENCOUNTER — Ambulatory Visit: Payer: Medicare Other | Admitting: Cardiology

## 2019-11-24 ENCOUNTER — Encounter: Payer: Self-pay | Admitting: Cardiology

## 2019-11-24 VITALS — BP 136/72 | HR 74 | Temp 97.1°F | Ht 64.0 in | Wt 177.2 lb

## 2019-11-24 DIAGNOSIS — I25118 Atherosclerotic heart disease of native coronary artery with other forms of angina pectoris: Secondary | ICD-10-CM

## 2019-11-24 DIAGNOSIS — I5043 Acute on chronic combined systolic (congestive) and diastolic (congestive) heart failure: Secondary | ICD-10-CM | POA: Diagnosis not present

## 2019-11-24 DIAGNOSIS — Z9981 Dependence on supplemental oxygen: Secondary | ICD-10-CM

## 2019-11-24 DIAGNOSIS — N183 Chronic kidney disease, stage 3 unspecified: Secondary | ICD-10-CM

## 2019-11-24 DIAGNOSIS — E1122 Type 2 diabetes mellitus with diabetic chronic kidney disease: Secondary | ICD-10-CM

## 2019-11-24 MED ORDER — METOLAZONE 5 MG PO TABS: 5.0000 mg | ORAL_TABLET | ORAL | 0 refills | Status: DC

## 2019-11-24 NOTE — Progress Notes (Signed)
Primary Physician:  [name], PA-C   [name], female    DOB: 1941-03-10, 79 y.o.   MRN: [id number]  Subjective:    Chief Complaint  Patient presents with  . Congestive Heart Failure  . Follow-up    1 week    HPI: Sara Graves  is a 79 y.o. female  with hypertension, uncontrolled type 2 DM, CAD s/p prior LAD and ramus PCI, HFpEF, h/o Rt leg cellulitis and metatarsal fracture, managed by Podiatry. Had admission to hospital for CHF on 01/23/2019 due to marked dyspnea and discharged on 2L [place] O2. Echo revealed new decrease in LVEF and underwent coronary angiogram on 01/27/19 and PCI to LAD (new stent) and balloon PTCA to IST RI lesion.   She underwent echocardiogram on 05/19/19 that continued to reveal depressed LVEF of 20-25%.  She was evaluated by Dr. Lovena Le and underwent BiV ICD implantation on 06/21/2019.  She was seen 1 week ago virtually. She had noted increased weight, leg swelling, and worsening dyspnea. She was started back on Metolazone for 3 days. She reports that she did not notice any significant improvement in her leg swelling. She has since increased her torsemide to BID. Dyspnea did improve. States that she continues to have no appetite. No nausea or diarrhea. She continues to use oxygen therapy.   She has not had any recent episodes of chest pain. No nitroglycerin use.    Past Medical History:  Diagnosis Date  . Acute combined systolic and diastolic heart failure ([place]) 04/25/2018  . Anxiety   . Arthritis    "hands" (12/29/2017)  . Basal cell carcinoma of nose    removed  . Bursitis of left shoulder   . BV ICD Medtronic [place] MRI CRTD 06/21/2019  . Cat scratch fever    Late 90s  . Chronic combined systolic and diastolic CHF (congestive heart failure) ([place]) 06/08/2019  . Coronary artery disease   . Depression   . Diabetes mellitus, type II, insulin dependent ([place])   . Encounter for assessment of implantable cardioverter-defibrillator (ICD)  09/28/2019  . GERD (gastroesophageal reflux disease)   . H. pylori infection 2008 and 1998   treated  . Hyperlipidemia   . Hypertension   . Hypothyroidism   . Low oxygen saturation   . Multinodular goiter   . Rotator cuff tear, left recurrent   . Urge urinary incontinence   . UTI (lower urinary tract infection) 05/2016    Past Surgical History:  Procedure Laterality Date  . APPENDECTOMY  AGE 85  . BACK SURGERY    . BASAL CELL CARCINOMA EXCISION     "nose"  . BIV ICD INSERTION CRT-D N/A 06/21/2019   Procedure: BIV ICD INSERTION CRT-D;  Surgeon: Evans Lance, MD;  Location: [facility];  Service: Cardiovascular;  Laterality: N/A;  . BLADDER NECK SUSPENSION  1970's  . BUNIONECTOMY WITH HAMMERTOE RECONSTRUCTION Bilateral   . CARPAL TUNNEL RELEASE Right 05/2017  . COLONOSCOPY W/ POLYPECTOMY    . CORONARY ANGIOPLASTY WITH STENT PLACEMENT  12/29/2017  . CORONARY BALLOON ANGIOPLASTY N/A 02/08/2019   Procedure: CORONARY BALLOON ANGIOPLASTY;  Surgeon: Adrian Prows, MD;  Location: [facility];  Service: Cardiovascular;  Laterality: N/A;  . CORONARY STENT INTERVENTION N/A 12/29/2017   Procedure: CORONARY STENT INTERVENTION;  Surgeon: Nigel Mormon, MD;  Location: [facility];  Service: Cardiovascular;  Laterality: N/A;  . CORONARY STENT INTERVENTION N/A 04/26/2018   Procedure: CORONARY STENT INTERVENTION;  Surgeon: Nigel Mormon, MD;  Location: [facility];  Service: Cardiovascular;  Laterality: N/A;  . CORONARY STENT INTERVENTION N/A 02/08/2019   Procedure: CORONARY STENT INTERVENTION;  Surgeon: Adrian Prows, MD;  Location: [facility];  Service: Cardiovascular;  Laterality: N/A;  . ESOPHAGOGASTRODUODENOSCOPY ENDOSCOPY    . FORAMINAL DECOMPRESSION AT L2 TO THE SACRUM  01-05-2008   L2  -  S1  . GANGLION CYST EXCISION Left 01/17/2009   ring finger  . IMPLANTATION PERMANENT SPINAL CORD STIMULATOR  06-15-2008   JUNE 2013--  BATTERY CHANGE  . JOINT  REPLACEMENT    . LEFT HEART CATH AND CORONARY ANGIOGRAPHY N/A 04/26/2018   Procedure: LEFT HEART CATH AND CORONARY ANGIOGRAPHY;  Surgeon: Nigel Mormon, MD;  Location: [facility];  Service: Cardiovascular;  Laterality: N/A;  . LEFT HEART CATH AND CORONARY ANGIOGRAPHY N/A 02/08/2019   Procedure: LEFT HEART CATH AND CORONARY ANGIOGRAPHY;  Surgeon: Adrian Prows, MD;  Location: [facility];  Service: Cardiovascular;  Laterality: N/A;  . LIPOMA EXCISION Right    RIGHT ELBOW  . METATARSAL HEAD EXCISION Right 06/16/2018   Procedure: right 5th metatarsal excision, a mini c-arm;  Surgeon: Trula Slade, DPM;  Location: [facility];  Service: Podiatry;  Laterality: Right;  anesthesia can do block  . RIGHT/LEFT HEART CATH AND CORONARY ANGIOGRAPHY N/A 12/29/2017   Procedure: RIGHT/LEFT HEART CATH AND CORONARY ANGIOGRAPHY;  Surgeon: Nigel Mormon, MD;  Location: [facility];  Service: Cardiovascular;  Laterality: N/A;  . SHOULDER OPEN ROTATOR CUFF REPAIR  09/07/2012   Procedure: ROTATOR CUFF REPAIR SHOULDER OPEN;  Surgeon: Magnus Sinning, MD;  Location: [facility];  Service: Orthopedics;  Laterality: Left;  OPEN ANTERIOR ACROMIONECTOMY AND ROTATOR CUFF REPAIR ON LEFT   . SHOULDER OPEN ROTATOR CUFF REPAIR Left 12/28/2012   Procedure: ROTATOR CUFF REPAIR SHOULDER OPEN;  Surgeon: Magnus Sinning, MD;  Location: [facility];  Service: Orthopedics;  Laterality: Left;  OPEN SHOULDER ROTATOR CUFF REPAIR ON LEFT  WITH ANTERIOR ACROMINECTOMY   . SHOULDER OPEN ROTATOR CUFF REPAIR Right 03/28/2003   RIGHT SHOULDER  DEGENERATIVE AC JOINT AND RC TEAR  . SPINAL CORD STIMULATOR BATTERY EXCHANGE N/A 07/03/2016   Procedure: SPINAL CORD STIMULATOR BATTERY PLACMENT;  Surgeon: Melina Schools, MD;  Location: [facility];  Service: Orthopedics;  Laterality: N/A;  . TONSILLECTOMY  AGE 7  . 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 VAGINAL HYSTERECTOMY   1970's    Social History   Socioeconomic History  . Marital status: Married    Spouse name: Not on file  . Number of children: 2  . Years of education: 11th  . Highest education level: Not on file  Occupational History  . Occupation: Retired  Tobacco Use  . Smoking status: Never Smoker  . Smokeless tobacco: Never Used  Substance and Sexual Activity  . Alcohol use: No  . Drug use: No  . Sexual activity: Yes  Other Topics Concern  . Not on file  Social History Narrative   Pt lives with her husband and 1 grown son in [place]. Younger son has muscular dystrophy. Having a difficult time as primary caregiver to son who is not doing well.  Declined respite care/placement.   Caffeine Use: none; very little   Social Determinants of Health   Financial Resource Strain:   . Difficulty of Paying Living Expenses: Not on file  Food Insecurity:   .  Worried About Charity fundraiser in the Last Year: Not on file  . Ran Out of Food in the Last Year: Not on file  Transportation Needs:   . Lack of Transportation (Medical): Not on file  . Lack of Transportation (Non-Medical): Not on file  Physical Activity:   . Days of Exercise per Week: Not on file  . Minutes of Exercise per Session: Not on file  Stress:   . Feeling of Stress : Not on file  Social Connections:   . Frequency of Communication with Friends and Family: Not on file  . Frequency of Social Gatherings with Friends and Family: Not on file  . Attends Religious Services: Not on file  . Active Member of Clubs or Organizations: Not on file  . Attends Archivist Meetings: Not on file  . Marital Status: Not on file  Intimate Partner Violence:   . Fear of Current or Ex-Partner: Not on file  . Emotionally Abused: Not on file  . Physically Abused: Not on file  . Sexually Abused: Not on file    Review of Systems  Constitution: Positive for malaise/fatigue. Negative for chills, decreased appetite and weight gain.    Cardiovascular: Positive for dyspnea on exertion (worsening) and leg swelling. Negative for chest pain, orthopnea and syncope.  Respiratory: Negative for cough.   Endocrine: Negative for cold intolerance.  Hematologic/Lymphatic: Does not bruise/bleed easily.  Musculoskeletal: Negative for joint swelling.  Gastrointestinal: Negative for abdominal pain, anorexia, change in bowel habit, diarrhea and nausea.  Neurological: Positive for loss of balance. Negative for headaches and light-headedness.  Psychiatric/Behavioral: Negative for depression and substance abuse.  All other systems reviewed and are negative.     Objective:  Blood pressure 136/72, pulse 74, temperature (!) 97.1 F (36.2 C), height '5\' 4"'$  (1.626 m), weight 177 lb 3.2 oz (80.4 kg), SpO2 98 %. Body mass index is 30.42 kg/m.   Vitals with BMI 11/24/2019 11/24/2019 11/17/2019  Height - '5\' 4"'$  '5\' 4"'$   Weight - 177 lbs 3 oz 165 lbs  BMI - 86.5 78.46  Systolic 962 952 -  Diastolic 72 62 -  Pulse - 74 -      SIX MIN WALK 09/28/2019 09/28/2019  Supplimental Oxygen during Test? (L/min) No No  Laps 4 -  Partial Lap (in Meters) 100 -  Baseline Heartrate 101 -  Baseline SPO2 94 -  Heartrate 115 -  SPO2 82 -  Heartrate 111 -  SPO2 85 -  Stopped or Paused before Six Minutes Yes -  Other Symptoms at end of Exercise pt can not finish 6 min walk and only did 4 min -  Distance Completed 236 -  Provider Comments: pt can not finish 6 min walk and only did 4 min -   Six minute walk test Resting room air = 94% Room air walking = 82% Oxygen 2 Liters while ambulating = 92%   Physical Exam  Constitutional: She appears well-developed and well-nourished. No distress.  HENT:  Head: Atraumatic.  Eyes: Conjunctivae are normal.  Neck: No JVD present. No thyromegaly present.  Cardiovascular: Normal rate, regular rhythm, normal heart sounds and intact distal pulses. Exam reveals no gallop.  No murmur heard. Pulses:      Carotid pulses  are 2+ on the right side with bruit and 2+ on the left side.      Femoral pulses are 2+ on the right side and 2+ on the left side.      Popliteal  pulses are 0 on the right side and 0 on the left side.       Dorsalis pedis pulses are 0 on the right side and 0 on the left side.       Posterior tibial pulses are 0 on the right side and 0 on the left side.  Pulmonary/Chest: Effort normal and breath sounds normal.  Abdominal: Soft. Bowel sounds are normal.  Musculoskeletal:        General: No edema. Normal range of motion.     Cervical back: Neck supple.  Neurological: She is alert.  Skin: Skin is warm and dry.  Psychiatric: She has a normal mood and affect.   Radiology: No results found.  Laboratory examination:  06/10/2019: Creatinine 1.8, potassium 3.9, EGFR 25, CMP otherwise normal.  04/22/2019: Creatinine 1.5, eGFR 33/38, potassium 4.6, CMP otherwise normal. HgbA1c 8.2%   07/08/2018: Cholesterol 166, triglycerides 137, HDL 55, LDL 84.  CMP Latest Ref Rng & Units 10/11/2019 09/15/2019 06/17/2019  Glucose 65 - 99 mg/dL 131(H) 212(H) 213(H)  BUN 8 - 27 mg/dL _0 Creatinine 0.57 - 1.00 mg/dL 0.82 1.08(H) 0.97  Sodium 134 - 144 mmol/L 144 143 143  Potassium 3.5 - 5.2 mmol/L 4.2 3.7 4.2  Chloride 96 - 106 mmol/L 107(H) 106 101  CO2 20 - 29 mmol/L _1 Calcium 8.7 - 10.3 mg/dL 9.5 9.4 10.6(H)  Total Protein 6.0 - 8.5 g/dL - 6.8 -  Total Bilirubin 0.0 - 1.2 mg/dL - 0.6 -  Alkaline Phos 39 - 117 IU/L - 96 -  AST 0 - 40 IU/L - 9 -  ALT 0 - 32 IU/L - 10 -   CBC Latest Ref Rng & Units 06/17/2019 01/21/2019 01/20/2019  WBC 3.4 - 10.8 x10E3/uL 6.7 4.0 4.5  Hemoglobin 11.1 - 15.9 g/dL 13.9 11.1(L) 11.1(L)  Hematocrit 34.0 - 46.6 % 43.7 34.3(L) 34.2(L)  Platelets 150 - 450 x10E3/uL 139(L) 112(L) 106(L)   Lipid Panel     Component Value Date/Time   CHOL 205 (H) 09/15/2019 1614   TRIG 160 (H) 09/15/2019 1614   HDL 46 09/15/2019 1614   CHOLHDL 4.5 (H) 09/15/2019 1614   CHOLHDL 3.9  03/25/2015 0325   VLDL 23 03/25/2015 0325   LDLCALC 130 (H) 09/15/2019 1614   LDLDIRECT 90 04/08/2012 1414   HEMOGLOBIN A1C Lab Results  Component Value Date   HGBA1C 7.8 (H) 01/20/2019   MPG 177.16 01/20/2019   TSH Recent Labs    01/20/19 2148  TSH 1.371    PRN Meds:. Medications Discontinued During This Encounter  Medication Reason  . metolazone (ZAROXOLYN) 5 MG tablet Error   Current Meds  Medication Sig  . aspirin EC 81 MG tablet Take 81 mg by mouth daily.  . busPIRone (BUSPAR) 7.5 MG tablet Take 7.5 mg by mouth 2 (two) times daily.  . Calcium Carb-Cholecalciferol (CALCIUM 600 + D PO) Take 1 tablet by mouth daily.   . clopidogrel (PLAVIX) 75 MG tablet Take 1 tablet (75 mg total) by mouth daily.  . diphenhydrAMINE (BENADRYL) 25 mg capsule Take 25 mg by mouth as needed.  . ezetimibe (ZETIA) 10 MG tablet Take 1 tablet (10 mg total) by mouth daily.  Marland Kitchen FLUoxetine (PROZAC) 20 MG tablet Take 20 mg by mouth daily.  Marland Kitchen glucose blood (ONETOUCH VERIO) test strip 1 each 3 (three) times daily.   . hydrALAZINE (APRESOLINE) 50 MG tablet Take 1 tablet (50 mg total) by mouth 3 (three) times daily.  Marland Kitchen  insulin lispro (HUMALOG) 100 UNIT/ML injection Inject 2-6 Units into the skin See admin instructions. For use with V - GO 20 Insulin Delivery Device: inject 6 units subcutaneously three times daily before meals, inject 2 units at bedtime if eating a bedtime snack  . Investigational - Study Medication Take 1 tablet by mouth daily. Study name: dapagliflozin/metformin ER 10/1000mg Additional study details: This is a Drug Study medication from Dr. Nadyne Coombes at [facility]. Patient started taking this medication on 04/22/18 and is unsure of how long she is to take this medication for. Per patient, she is on this medication as part of a Heart and Diabetes drug study.  . isosorbide mononitrate (IMDUR) 60 MG 24 hr tablet Take 60 mg by mouth daily.  Marland Kitchen labetalol (NORMODYNE) 100 MG tablet Take 100 mg by  mouth 2 (two) times daily.  Marland Kitchen levothyroxine (SYNTHROID, LEVOTHROID) 50 MCG tablet Take 50 mcg by mouth daily before breakfast.   . losartan (COZAAR) 25 MG tablet Take 1 tablet (25 mg total) by mouth daily.  . metoCLOPramide (REGLAN) 5 MG tablet Take 1 tablet by mouth 3 (three) times daily as needed.   . naproxen (NAPROSYN) 500 MG tablet Take 500 mg by mouth 2 (two) times daily as needed.   . nitroGLYCERIN (NITROSTAT) 0.4 MG SL tablet Place 1 tablet (0.4 mg total) under the tongue every 5 (five) minutes x 3 doses as needed for chest pain.  Glory Rosebush VERIO test strip 1 each 3 (three) times daily.   . pantoprazole (PROTONIX) 40 MG tablet Take 1 tablet (40 mg total) by mouth 2 (two) times daily. (Patient taking differently: Take 40 mg by mouth daily as needed (acid reflux). )  . rosuvastatin (CRESTOR) 40 MG tablet Take 1 tablet (40 mg total) by mouth daily.  Marland Kitchen torsemide (DEMADEX) 20 MG tablet Take 1 tablet (20 mg total) by mouth every other day. Can increase to  Daily if leg swelling (Patient taking differently: Take 20 mg by mouth daily. Can increase to  Twice Daily if leg swelling)  . vitamin B-12 (CYANOCOBALAMIN) 1000 MCG tablet Take 1,000 mcg by mouth daily.   . Vitamin D, Ergocalciferol, (DRISDOL) 50000 units CAPS capsule Take 50,000 Units by mouth every Thursday.     Cardiac Studies:   Echocardiogram 05/20/2019: Left ventricle cavity is moderately dilated.  Severely depressed LV systolic function with global hypokinesis. LVEF 20-25%. Cannot exclude LV apical thrombus. Moderate concentric hypertrophy of the left ventricle. Normal global wall motion. Doppler evidence of grade II (pseudonormal) diastolic dysfunction, elevated LAP.  Left atrial cavity is severely dilated. Right atrial cavity is mildly dilated. Moderate (Grade II) aortic regurgitation. Moderate (Grade III) mitral regurgitation. Moderate tricuspid regurgitation. Estimated pulmonary artery systolic pressure is 65 mmHg. Small  pericardial effusion.  No significant change compared to previous study on 01/21/2019.   Carotid artery duplex 12/02/2018: No hemodynamically significant arterial disease in the internal carotid artery bilaterally. Minimal plaque noted bilateral carotid arteries. Antegrade right vertebral artery flow. Antegrade left vertebral artery flow.  Coronary angiogram 02/08/2019: Distal left main 20% diffuse disease. Ostial LAD stent shows diffuse 20% in-stent restenosis (3.0 x 15 mm resolute Onyx on 12/29/2017). Mid LAD and mid to distal LAD there are tandem lesions 80% and 95%. Ramus intermediate ostial 95 to 99% stenosis. Previously placed RI stent widely patent ( 2.0 x 12 mm resolute on 04/26/2018). 30 to 40% tandem lesions in the RCA. Cutting Balloon angioplasty of the ramus intermediate ostial high-grade stenosis, 99% reduced to 0%  and Cutting Balloon angioplasty of the mid LAD followed by stenting with 2.5 x 32 mm Synergy DES for high-grade 90% stenosis reduced to 0% and TIMI-3 to TIMI-3 flow maintained in both lesions.  Assessment:     ICD-10-CM   1. Acute on chronic combined systolic and diastolic heart failure (HCC)  I50.43   2. CKD stage 3 due to type 2 diabetes mellitus (HCC)  E11.22    N18.30   3. Coronary artery disease of native artery of native heart with stable angina pectoris ([place])  I25.118   4. On supplemental oxygen therapy  Z99.81     EKG 02/09/19: Sinus bradycardia at rate of 55 bpm, left bundle branch block.  No further analysis.  No significant change from prior EKG 02/08/2019.  Recommendations:   Patient is here for 1 week follow up for close follow up with acute on chronic systolic and diastolic heart failure. She did not have significant reduction in leg swelling with Metolazone. She has had some improvement in dyspnea. I will restart her Metolazone daily for the next 1 week. Will anticipate that she will need this every other day. She can continue to use torsemide  daily and taking extra dose on days that she has worsening dyspnea or leg swelling. She is not in distress. She has not had any chest pain or symptoms of angina. Blood pressure is stable. I have urged her to continue to be careful with her diet. Will need to closely monitor her kidney function in view of CKD. I will plan to see her back again in 1 week for close follow up and will consider ordering labs at that time. She is scheduled for echo and pacemaker check next week as well.    Miquel Dunn, MSN, APRN, FNP-C [facility]. [place] Office: [phone] Fax: [phone]

## 2019-11-25 ENCOUNTER — Ambulatory Visit: Payer: Medicare Other | Admitting: Cardiology

## 2019-11-25 ENCOUNTER — Other Ambulatory Visit: Payer: Self-pay | Admitting: Cardiology

## 2019-11-30 ENCOUNTER — Ambulatory Visit: Payer: Medicare Other | Admitting: Cardiology

## 2019-11-30 ENCOUNTER — Ambulatory Visit (INDEPENDENT_AMBULATORY_CARE_PROVIDER_SITE_OTHER): Payer: Medicare PPO

## 2019-11-30 ENCOUNTER — Ambulatory Visit (INDEPENDENT_AMBULATORY_CARE_PROVIDER_SITE_OTHER): Payer: Medicare PPO | Admitting: Cardiology

## 2019-11-30 ENCOUNTER — Encounter: Payer: Self-pay | Admitting: Cardiology

## 2019-11-30 ENCOUNTER — Other Ambulatory Visit: Payer: Self-pay

## 2019-11-30 VITALS — BP 140/80 | HR 84 | Temp 97.1°F | Ht 65.0 in | Wt 170.0 lb

## 2019-11-30 DIAGNOSIS — Z95810 Presence of automatic (implantable) cardiac defibrillator: Secondary | ICD-10-CM

## 2019-11-30 DIAGNOSIS — I5043 Acute on chronic combined systolic (congestive) and diastolic (congestive) heart failure: Secondary | ICD-10-CM

## 2019-11-30 DIAGNOSIS — I255 Ischemic cardiomyopathy: Secondary | ICD-10-CM

## 2019-11-30 DIAGNOSIS — Z4502 Encounter for adjustment and management of automatic implantable cardiac defibrillator: Secondary | ICD-10-CM | POA: Diagnosis not present

## 2019-11-30 DIAGNOSIS — I5042 Chronic combined systolic (congestive) and diastolic (congestive) heart failure: Secondary | ICD-10-CM

## 2019-11-30 DIAGNOSIS — I25118 Atherosclerotic heart disease of native coronary artery with other forms of angina pectoris: Secondary | ICD-10-CM

## 2019-11-30 NOTE — Progress Notes (Signed)
Primary Physician:  [name], PA-C   [name], female    DOB: 11-23-1940, 79 y.o.   MRN: [id number]  Subjective:    Chief Complaint  Patient presents with  . Cardiomyopathy  . Follow-up    HPI: Sara Graves  is a 79 y.o. female  with hypertension, uncontrolled type 2 DM, CAD s/p prior LAD and ramus PCI, HFpEF, h/o Rt leg cellulitis and metatarsal fracture, managed by Podiatry. Had admission to hospital for CHF on 01/23/2019 due to marked dyspnea and discharged on 2L [place] O2. Echo revealed new decrease in LVEF and underwent coronary angiogram on 01/27/19 and PCI to LAD (new stent) and balloon PTCA to IST RI lesion.   She underwent echocardiogram on 05/19/19 that continued to reveal depressed LVEF of 20-25%.  She was evaluated by Dr. Lovena Le and underwent BiV ICD implantation on 06/21/2019.  She is here today for ICD evaluation as well as 1 week office visit for close monitoring of CHF exacerbation. She also had echocardiogram performed today. She was started on Metolazone 5 mg for the next 1 week. She is feeling much better. Leg edema has significantly improved. Does reports some burning with urination. States that she continues to have no appetite. No nausea or diarrhea. She continues to use oxygen therapy.   She has not had any recent episodes of chest pain. No nitroglycerin use.    Past Medical History:  Diagnosis Date  . Acute combined systolic and diastolic heart failure ([place]) 04/25/2018  . Anxiety   . Arthritis    "hands" (12/29/2017)  . Basal cell carcinoma of nose    removed  . Bursitis of left shoulder   . BV ICD Medtronic Claria MRI CRTD 06/21/2019  . Cat scratch fever    Late 90s  . Chronic combined systolic and diastolic CHF (congestive heart failure) ([place]) 06/08/2019  . Coronary artery disease   . Depression   . Diabetes mellitus, type II, insulin dependent ([place])   . Encounter for assessment of implantable cardioverter-defibrillator (ICD)  09/28/2019  . GERD (gastroesophageal reflux disease)   . H. pylori infection 2008 and 1998   treated  . Hyperlipidemia   . Hypertension   . Hypothyroidism   . Low oxygen saturation   . Multinodular goiter   . Rotator cuff tear, left recurrent   . Urge urinary incontinence   . UTI (lower urinary tract infection) 05/2016    Past Surgical History:  Procedure Laterality Date  . APPENDECTOMY  AGE 35  . BACK SURGERY    . BASAL CELL CARCINOMA EXCISION     "nose"  . BIV ICD INSERTION CRT-D N/A 06/21/2019   Procedure: BIV ICD INSERTION CRT-D;  Surgeon: Evans Lance, MD;  Location: [facility];  Service: Cardiovascular;  Laterality: N/A;  . BLADDER NECK SUSPENSION  1970's  . BUNIONECTOMY WITH HAMMERTOE RECONSTRUCTION Bilateral   . CARPAL TUNNEL RELEASE Right 05/2017  . COLONOSCOPY W/ POLYPECTOMY    . CORONARY ANGIOPLASTY WITH STENT PLACEMENT  12/29/2017  . CORONARY BALLOON ANGIOPLASTY N/A 02/08/2019   Procedure: CORONARY BALLOON ANGIOPLASTY;  Surgeon: Adrian Prows, MD;  Location: [facility];  Service: Cardiovascular;  Laterality: N/A;  . CORONARY STENT INTERVENTION N/A 12/29/2017   Procedure: CORONARY STENT INTERVENTION;  Surgeon: Nigel Mormon, MD;  Location: [facility];  Service: Cardiovascular;  Laterality: N/A;  . CORONARY STENT INTERVENTION N/A 04/26/2018   Procedure: CORONARY STENT INTERVENTION;  Surgeon: Vernell Leep  J, MD;  Location: [facility];  Service: Cardiovascular;  Laterality: N/A;  . CORONARY STENT INTERVENTION N/A 02/08/2019   Procedure: CORONARY STENT INTERVENTION;  Surgeon: Adrian Prows, MD;  Location: [facility];  Service: Cardiovascular;  Laterality: N/A;  . ESOPHAGOGASTRODUODENOSCOPY ENDOSCOPY    . FORAMINAL DECOMPRESSION AT L2 TO THE SACRUM  01-05-2008   L2  -  S1  . GANGLION CYST EXCISION Left 01/17/2009   ring finger  . IMPLANTATION PERMANENT SPINAL CORD STIMULATOR  06-15-2008   JUNE 2013--  BATTERY CHANGE  . JOINT  REPLACEMENT    . LEFT HEART CATH AND CORONARY ANGIOGRAPHY N/A 04/26/2018   Procedure: LEFT HEART CATH AND CORONARY ANGIOGRAPHY;  Surgeon: Nigel Mormon, MD;  Location: [facility];  Service: Cardiovascular;  Laterality: N/A;  . LEFT HEART CATH AND CORONARY ANGIOGRAPHY N/A 02/08/2019   Procedure: LEFT HEART CATH AND CORONARY ANGIOGRAPHY;  Surgeon: Adrian Prows, MD;  Location: [facility];  Service: Cardiovascular;  Laterality: N/A;  . LIPOMA EXCISION Right    RIGHT ELBOW  . METATARSAL HEAD EXCISION Right 06/16/2018   Procedure: right 5th metatarsal excision, a mini c-arm;  Surgeon: Trula Slade, DPM;  Location: [facility];  Service: Podiatry;  Laterality: Right;  anesthesia can do block  . RIGHT/LEFT HEART CATH AND CORONARY ANGIOGRAPHY N/A 12/29/2017   Procedure: RIGHT/LEFT HEART CATH AND CORONARY ANGIOGRAPHY;  Surgeon: Nigel Mormon, MD;  Location: [facility];  Service: Cardiovascular;  Laterality: N/A;  . SHOULDER OPEN ROTATOR CUFF REPAIR  09/07/2012   Procedure: ROTATOR CUFF REPAIR SHOULDER OPEN;  Surgeon: Magnus Sinning, MD;  Location: [facility];  Service: Orthopedics;  Laterality: Left;  OPEN ANTERIOR ACROMIONECTOMY AND ROTATOR CUFF REPAIR ON LEFT   . SHOULDER OPEN ROTATOR CUFF REPAIR Left 12/28/2012   Procedure: ROTATOR CUFF REPAIR SHOULDER OPEN;  Surgeon: Magnus Sinning, MD;  Location: [facility];  Service: Orthopedics;  Laterality: Left;  OPEN SHOULDER ROTATOR CUFF REPAIR ON LEFT  WITH ANTERIOR ACROMINECTOMY   . SHOULDER OPEN ROTATOR CUFF REPAIR Right 03/28/2003   RIGHT SHOULDER  DEGENERATIVE AC JOINT AND RC TEAR  . SPINAL CORD STIMULATOR BATTERY EXCHANGE N/A 07/03/2016   Procedure: SPINAL CORD STIMULATOR BATTERY PLACMENT;  Surgeon: Melina Schools, MD;  Location: [facility];  Service: Orthopedics;  Laterality: N/A;  . TONSILLECTOMY  AGE 39  . 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 VAGINAL HYSTERECTOMY   1970's    Social History   Socioeconomic History  . Marital status: Married    Spouse name: Not on file  . Number of children: 2  . Years of education: 11th  . Highest education level: Not on file  Occupational History  . Occupation: Retired  Tobacco Use  . Smoking status: Never Smoker  . Smokeless tobacco: Never Used  Substance and Sexual Activity  . Alcohol use: No  . Drug use: No  . Sexual activity: Yes  Other Topics Concern  . Not on file  Social History Narrative   Pt lives with her husband and 1 grown son in [place]. Younger son has muscular dystrophy. Having a difficult time as primary caregiver to son who is not doing well.  Declined respite care/placement.   Caffeine Use: none; very little   Social Determinants of Health   Financial Resource Strain:   . Difficulty of Paying Living Expenses: Not on file  Food Insecurity:   . Worried  About Running Out of Food in the Last Year: Not on file  . Ran Out of Food in the Last Year: Not on file  Transportation Needs:   . Lack of Transportation (Medical): Not on file  . Lack of Transportation (Non-Medical): Not on file  Physical Activity:   . Days of Exercise per Week: Not on file  . Minutes of Exercise per Session: Not on file  Stress:   . Feeling of Stress : Not on file  Social Connections:   . Frequency of Communication with Friends and Family: Not on file  . Frequency of Social Gatherings with Friends and Family: Not on file  . Attends Religious Services: Not on file  . Active Member of Clubs or Organizations: Not on file  . Attends Archivist Meetings: Not on file  . Marital Status: Not on file  Intimate Partner Violence:   . Fear of Current or Ex-Partner: Not on file  . Emotionally Abused: Not on file  . Physically Abused: Not on file  . Sexually Abused: Not on file    Review of Systems  Constitution: Positive for malaise/fatigue. Negative for chills, decreased appetite and weight gain.    Cardiovascular: Positive for dyspnea on exertion (worsening) and leg swelling. Negative for chest pain, orthopnea and syncope.  Respiratory: Negative for cough.   Endocrine: Negative for cold intolerance.  Hematologic/Lymphatic: Does not bruise/bleed easily.  Musculoskeletal: Negative for joint swelling.  Gastrointestinal: Negative for abdominal pain, anorexia, change in bowel habit, diarrhea and nausea.  Neurological: Positive for loss of balance. Negative for headaches and light-headedness.  Psychiatric/Behavioral: Negative for depression and substance abuse.  All other systems reviewed and are negative.     Objective:  Blood pressure 140/80, pulse 84, temperature (!) 97.1 F (36.2 C), height 5' 5" (1.651 m), weight 170 lb (77.1 kg), SpO2 97 %. Body mass index is 28.29 kg/m.   Vitals with BMI 11/30/2019 11/30/2019 11/24/2019  Height - 5' 5" -  Weight - 170 lbs -  BMI - 97.98 -  Systolic 921 194 174  Diastolic 80 73 72  Pulse - 84 -      SIX MIN WALK 09/28/2019 09/28/2019  Supplimental Oxygen during Test? (L/min) No No  Laps 4 -  Partial Lap (in Meters) 100 -  Baseline Heartrate 101 -  Baseline SPO2 94 -  Heartrate 115 -  SPO2 82 -  Heartrate 111 -  SPO2 85 -  Stopped or Paused before Six Minutes Yes -  Other Symptoms at end of Exercise pt can not finish 6 min walk and only did 4 min -  Distance Completed 236 -  Provider Comments: pt can not finish 6 min walk and only did 4 min -   Six minute walk test Resting room air = 94% Room air walking = 82% Oxygen 2 Liters while ambulating = 92%   Physical Exam  Constitutional: She appears well-developed and well-nourished. No distress.  HENT:  Head: Atraumatic.  Eyes: Conjunctivae are normal.  Neck: No JVD present. No thyromegaly present.  Cardiovascular: Normal rate, regular rhythm, normal heart sounds and intact distal pulses. Exam reveals no gallop.  No murmur heard. Pulses:      Carotid pulses are 2+ on the right  side with bruit and 2+ on the left side.      Femoral pulses are 2+ on the right side and 2+ on the left side.      Popliteal pulses are 0 on the right side  and 0 on the left side.       Dorsalis pedis pulses are 0 on the right side and 0 on the left side.       Posterior tibial pulses are 0 on the right side and 0 on the left side.  Trace leg edema  Pulmonary/Chest: Effort normal and breath sounds normal.  Abdominal: Soft. Bowel sounds are normal.  Musculoskeletal:        General: No edema. Normal range of motion.     Cervical back: Neck supple.  Neurological: She is alert.  Skin: Skin is warm and dry.  Psychiatric: She has a normal mood and affect.  Vitals reviewed.  Radiology: No results found.  Laboratory examination:  06/10/2019: Creatinine 1.8, potassium 3.9, EGFR 25, CMP otherwise normal.  04/22/2019: Creatinine 1.5, eGFR 33/38, potassium 4.6, CMP otherwise normal. HgbA1c 8.2%   07/08/2018: Cholesterol 166, triglycerides 137, HDL 55, LDL 84.  CMP Latest Ref Rng & Units 11/30/2019 10/11/2019 09/15/2019  Glucose 65 - 99 mg/dL 155(H) 131(H) 212(H)  BUN 8 - 27 mg/dL _0 Creatinine 0.57 - 1.00 mg/dL 0.94 0.82 1.08(H)  Sodium 134 - 144 mmol/L 142 144 143  Potassium 3.5 - 5.2 mmol/L 4.6 4.2 3.7  Chloride 96 - 106 mmol/L 102 107(H) 106  CO2 20 - 29 mmol/L _1 Calcium 8.7 - 10.3 mg/dL 9.5 9.5 9.4  Total Protein 6.0 - 8.5 g/dL - - 6.8  Total Bilirubin 0.0 - 1.2 mg/dL - - 0.6  Alkaline Phos 39 - 117 IU/L - - 96  AST 0 - 40 IU/L - - 9  ALT 0 - 32 IU/L - - 10   CBC Latest Ref Rng & Units 06/17/2019 01/21/2019 01/20/2019  WBC 3.4 - 10.8 x10E3/uL 6.7 4.0 4.5  Hemoglobin 11.1 - 15.9 g/dL 13.9 11.1(L) 11.1(L)  Hematocrit 34.0 - 46.6 % 43.7 34.3(L) 34.2(L)  Platelets 150 - 450 x10E3/uL 139(L) 112(L) 106(L)   Lipid Panel     Component Value Date/Time   CHOL 205 (H) 09/15/2019 1614   TRIG 160 (H) 09/15/2019 1614   HDL 46 09/15/2019 1614   CHOLHDL 4.5 (H) 09/15/2019 1614    CHOLHDL 3.9 03/25/2015 0325   VLDL 23 03/25/2015 0325   LDLCALC 130 (H) 09/15/2019 1614   LDLDIRECT 90 04/08/2012 1414   HEMOGLOBIN A1C Lab Results  Component Value Date   HGBA1C 7.8 (H) 01/20/2019   MPG 177.16 01/20/2019   TSH Recent Labs    01/20/19 2148 11/30/19 1437  TSH 1.371 3.100    PRN Meds:. Medications Discontinued During This Encounter  Medication Reason  . metolazone (ZAROXOLYN) 5 MG tablet Discontinued by provider   Current Meds  Medication Sig  . aspirin EC 81 MG tablet Take 81 mg by mouth daily.  . busPIRone (BUSPAR) 7.5 MG tablet Take 7.5 mg by mouth 2 (two) times daily.  . Calcium Carb-Cholecalciferol (CALCIUM 600 + D PO) Take 1 tablet by mouth daily.   . clopidogrel (PLAVIX) 75 MG tablet Take 1 tablet (75 mg total) by mouth daily.  . diphenhydrAMINE (BENADRYL) 25 mg capsule Take 25 mg by mouth as needed.  . ezetimibe (ZETIA) 10 MG tablet Take 1 tablet (10 mg total) by mouth daily.  Marland Kitchen FLUoxetine (PROZAC) 20 MG tablet Take 20 mg by mouth daily.  Marland Kitchen glucose blood (ONETOUCH VERIO) test strip 1 each 3 (three) times daily.   . hydrALAZINE (APRESOLINE) 25 MG tablet TAKE 1 TABLET BY MOUTH THREE TIMES A  DAY  . hydrALAZINE (APRESOLINE) 50 MG tablet Take 1 tablet (50 mg total) by mouth 3 (three) times daily.  . insulin lispro (HUMALOG) 100 UNIT/ML injection Inject 2-6 Units into the skin See admin instructions. For use with V - GO 20 Insulin Delivery Device: inject 6 units subcutaneously three times daily before meals, inject 2 units at bedtime if eating a bedtime snack  . Investigational - Study Medication Take 1 tablet by mouth daily. Study name: dapagliflozin/metformin ER 10/1000mg Additional study details: This is a Drug Study medication from Dr. Nadyne Coombes at [facility]. Patient started taking this medication on 04/22/18 and is unsure of how long she is to take this medication for. Per patient, she is on this medication as part of a Heart and Diabetes drug study.    . isosorbide mononitrate (IMDUR) 60 MG 24 hr tablet Take 60 mg by mouth daily.  Marland Kitchen labetalol (NORMODYNE) 100 MG tablet Take 100 mg by mouth 2 (two) times daily.  Marland Kitchen levothyroxine (SYNTHROID, LEVOTHROID) 50 MCG tablet Take 50 mcg by mouth daily before breakfast.   . losartan (COZAAR) 25 MG tablet Take 1 tablet (25 mg total) by mouth daily.  . metoCLOPramide (REGLAN) 5 MG tablet Take 1 tablet by mouth 3 (three) times daily as needed.   . naproxen (NAPROSYN) 500 MG tablet Take 500 mg by mouth 2 (two) times daily as needed.   . nitroGLYCERIN (NITROSTAT) 0.4 MG SL tablet Place 1 tablet (0.4 mg total) under the tongue every 5 (five) minutes x 3 doses as needed for chest pain.  Glory Rosebush VERIO test strip 1 each 3 (three) times daily.   . pantoprazole (PROTONIX) 40 MG tablet Take 1 tablet (40 mg total) by mouth 2 (two) times daily. (Patient taking differently: Take 40 mg by mouth daily as needed (acid reflux). )  . rosuvastatin (CRESTOR) 40 MG tablet Take 1 tablet (40 mg total) by mouth daily.  Marland Kitchen torsemide (DEMADEX) 20 MG tablet Take 1 tablet (20 mg total) by mouth every other day. Can increase to  Daily if leg swelling (Patient taking differently: Take 20 mg by mouth daily. Can increase to  Twice Daily if leg swelling)  . vitamin B-12 (CYANOCOBALAMIN) 1000 MCG tablet Take 1,000 mcg by mouth daily.   . Vitamin D, Ergocalciferol, (DRISDOL) 50000 units CAPS capsule Take 50,000 Units by mouth every Thursday.   . [DISCONTINUED] metolazone (ZAROXOLYN) 5 MG tablet Take 1 tablet (5 mg total) by mouth as directed. Take 1 tablet daily for the next 1 week    Cardiac Studies:   Echocardiogram 05/20/2019: Left ventricle cavity is moderately dilated.  Severely depressed LV systolic function with global hypokinesis. LVEF 20-25%. Cannot exclude LV apical thrombus. Moderate concentric hypertrophy of the left ventricle. Normal global wall motion. Doppler evidence of grade II (pseudonormal) diastolic dysfunction, elevated  LAP.  Left atrial cavity is severely dilated. Right atrial cavity is mildly dilated. Moderate (Grade II) aortic regurgitation. Moderate (Grade III) mitral regurgitation. Moderate tricuspid regurgitation. Estimated pulmonary artery systolic pressure is 65 mmHg. Small pericardial effusion.  No significant change compared to previous study on 01/21/2019.   Carotid artery duplex 12/02/2018: No hemodynamically significant arterial disease in the internal carotid artery bilaterally. Minimal plaque noted bilateral carotid arteries. Antegrade right vertebral artery flow. Antegrade left vertebral artery flow.  Coronary angiogram 02/08/2019: Distal left main 20% diffuse disease. Ostial LAD stent shows diffuse 20% in-stent restenosis (3.0 x 15 mm resolute Onyx on 12/29/2017). Mid LAD and mid to distal LAD  there are tandem lesions 80% and 95%. Ramus intermediate ostial 95 to 99% stenosis. Previously placed RI stent widely patent ( 2.0 x 12 mm resolute on 04/26/2018). 30 to 40% tandem lesions in the RCA. Cutting Balloon angioplasty of the ramus intermediate ostial high-grade stenosis, 99% reduced to 0% and Cutting Balloon angioplasty of the mid LAD followed by stenting with 2.5 x 32 mm Synergy DES for high-grade 90% stenosis reduced to 0% and TIMI-3 to TIMI-3 flow maintained in both lesions.  Assessment:     ICD-10-CM   1. Encounter for assessment of implantable cardioverter-defibrillator (ICD)  Z45.02   2. BV ICD Medtronic Claria MRI CRTD  Z95.810   3. Ischemic cardiomyopathy  I25.5   4. Acute on chronic combined systolic and diastolic CHF, NYHA class 3 (HCC)  M62.94 Basic metabolic panel    TSH    EKG 02/09/19: Sinus bradycardia at rate of 55 bpm, left bundle branch block.  No further analysis.  No significant change from prior EKG 02/08/2019.  Scheduled  In office ICD 12/02/19 Singl e (S)/Dual (D)/BV (M) M Presenting ASBP Pacer dependant: No. Underlying ASVS _0 /min. AP 4%, BP 95.% AMS  Episodes 0.   HVR 0.  Longevity 9.4 Years/Voltage. Charge time Gap Inc. Lead measurements: Stable Histogram: Low (L)/normal (N)/high (H)  Normal Patient activity Low. Thoracic impedance: Does not suggest fluid overload. Has returned to base after recent increase Nov 3 rd week and back to baseline Jan 4th 2021.   Observations: Normal ICD function.  Changes: None.  Recommendations:   Patient is here for 1 week follow up for acute on chronic CHF exacerbation and BVICD check in office. She has had significant improvement in her symptoms with Metolazone over the last one week and her impedance trends correlate with this. I will stop her Metolazone and continue with Torsemide. Will check BMP and TSH for surveillance of her kidney function. Dr. Hartford Poli has requested that her TSH level be checked as possible etiology for her decreased appetite and CHF exacerbation. CHF exacerbation occurred during holiday season, which makes me suspicious that this was related to her diet; however, she states that she has not been eating. I again counseled her on being careful with her diet and to try to be more active. She may need evaluation for her decreased appetite, in which I will defer to PCP.   She has not had any events on her ICD, and is functioning appropriately. Blood pressure was initially elevated, but on recheck had improved. Will continue with her present medications. She has had echocardiogram performed today, will notify her of the results once available. She has not had any episodes of angina. I will see her back in 4 weeks for continued close follow up on CHF.    *I have discussed this case with [name] and he personally examined the patient and participated in formulating the plan.*   Miquel Dunn, MSN, APRN, FNP-C [facility]. [place] Office: [phone] Fax: [phone]

## 2019-12-01 ENCOUNTER — Telehealth: Payer: Self-pay

## 2019-12-01 LAB — BASIC METABOLIC PANEL
BUN/Creatinine Ratio: 28 (ref 12–28)
BUN: 26 mg/dL (ref 8–27)
CO2: 26 mmol/L (ref 20–29)
Calcium: 9.5 mg/dL (ref 8.7–10.3)
Chloride: 102 mmol/L (ref 96–106)
Creatinine, Ser: 0.94 mg/dL (ref 0.57–1.00)
GFR calc Af Amer: 67 mL/min/{1.73_m2} (ref >59)
GFR calc non Af Amer: 58 mL/min/{1.73_m2} — ABNORMAL LOW (ref >59)
Glucose: 155 mg/dL — ABNORMAL HIGH (ref 65–99)
Potassium: 4.6 mmol/L (ref 3.5–5.2)
Sodium: 142 mmol/L (ref 134–144)

## 2019-12-01 LAB — TSH: TSH: 3.1 u[IU]/mL (ref 0.450–4.500)

## 2019-12-01 NOTE — Telephone Encounter (Signed)
-----   Message from Miquel Dunn, NP sent at 12/01/2019  8:12 AM EST ----- TSH and kidney function stable

## 2019-12-01 NOTE — Telephone Encounter (Signed)
Gave lab results to patient she verbalized understanding.

## 2019-12-02 ENCOUNTER — Encounter: Payer: Self-pay | Admitting: Cardiology

## 2019-12-05 NOTE — Addendum Note (Signed)
Addended by: Gwinda Maine on: 12/05/2019 08:09 AM   Modules accepted: Level of Service

## 2019-12-07 ENCOUNTER — Telehealth: Payer: Self-pay

## 2019-12-07 NOTE — Telephone Encounter (Signed)
-----   Message from Miquel Dunn, NP sent at 12/05/2019  8:13 AM EST ----- Heart function continues to be severely depressed at 20%. No changes from echo in July 2020

## 2019-12-07 NOTE — Telephone Encounter (Signed)
Gave patient results. She verbalized understanding.

## 2019-12-26 ENCOUNTER — Encounter: Payer: Self-pay | Admitting: Cardiology

## 2019-12-26 ENCOUNTER — Ambulatory Visit: Payer: Medicare PPO | Admitting: Cardiology

## 2019-12-26 ENCOUNTER — Other Ambulatory Visit: Payer: Self-pay

## 2019-12-26 VITALS — BP 170/75 | HR 71 | Temp 98.2°F | Ht 65.0 in | Wt 185.1 lb

## 2019-12-26 DIAGNOSIS — I25118 Atherosclerotic heart disease of native coronary artery with other forms of angina pectoris: Secondary | ICD-10-CM

## 2019-12-26 DIAGNOSIS — I10 Essential (primary) hypertension: Secondary | ICD-10-CM

## 2019-12-26 DIAGNOSIS — Z95810 Presence of automatic (implantable) cardiac defibrillator: Secondary | ICD-10-CM

## 2019-12-26 DIAGNOSIS — I5042 Chronic combined systolic (congestive) and diastolic (congestive) heart failure: Secondary | ICD-10-CM | POA: Diagnosis not present

## 2019-12-26 DIAGNOSIS — E782 Mixed hyperlipidemia: Secondary | ICD-10-CM

## 2019-12-26 NOTE — Progress Notes (Signed)
Primary Physician:  [name], PA-C   [name], female    DOB: 04/23/1941, 79 y.o.   MRN: [id number]  Subjective:    Chief Complaint  Patient presents with  . Congestive Heart Failure  . Follow-up    4 weeks    HPI: Sara Graves  is a 79 y.o. female  with hypertension, uncontrolled type 2 DM, CAD s/p prior LAD and ramus PCI, HFpEF, h/o Rt leg cellulitis and metatarsal fracture, managed by Podiatry. Had admission to hospital for CHF on 01/23/2019 due to marked dyspnea and discharged on 2L [place] O2. Echo revealed new decrease in LVEF and underwent coronary angiogram on 01/27/19 and PCI to LAD (new stent) and balloon PTCA to IST RI lesion.   She underwent echocardiogram on 05/19/19 that continued to reveal depressed LVEF of 20-25%.  She was evaluated by Dr. Lovena Le and underwent BiV ICD implantation on 06/21/2019.  She is here for 1 month follow up for CHF. Symptoms continue to be stable and improved. No worsening dyspnea or leg edema. She reports have a GI virus over the weekend and is still recovering from this. She continues to use oxygen therapy.   She has not had any recent episodes of chest pain. No nitroglycerin use.    Past Medical History:  Diagnosis Date  . Acute combined systolic and diastolic heart failure ([place]) 04/25/2018  . Anxiety   . Arthritis    "hands" (12/29/2017)  . Basal cell carcinoma of nose    removed  . Bursitis of left shoulder   . BV ICD Medtronic Claria MRI CRTD 06/21/2019  . Cat scratch fever    Late 90s  . Chronic combined systolic and diastolic CHF (congestive heart failure) ([place]) 06/08/2019  . Coronary artery disease   . Depression   . Diabetes mellitus, type II, insulin dependent ([place])   . Encounter for assessment of implantable cardioverter-defibrillator (ICD) 09/28/2019  . GERD (gastroesophageal reflux disease)   . H. pylori infection 2008 and 1998   treated  . Hyperlipidemia   . Hypertension   . Hypothyroidism   .  Low oxygen saturation   . Multinodular goiter   . Rotator cuff tear, left recurrent   . Urge urinary incontinence   . UTI (lower urinary tract infection) 05/2016    Past Surgical History:  Procedure Laterality Date  . APPENDECTOMY  AGE 77  . BACK SURGERY    . BASAL CELL CARCINOMA EXCISION     "nose"  . BIV ICD INSERTION CRT-D N/A 06/21/2019   Procedure: BIV ICD INSERTION CRT-D;  Surgeon: Evans Lance, MD;  Location: [facility];  Service: Cardiovascular;  Laterality: N/A;  . BLADDER NECK SUSPENSION  1970's  . BUNIONECTOMY WITH HAMMERTOE RECONSTRUCTION Bilateral   . CARPAL TUNNEL RELEASE Right 05/2017  . COLONOSCOPY W/ POLYPECTOMY    . CORONARY ANGIOPLASTY WITH STENT PLACEMENT  12/29/2017  . CORONARY BALLOON ANGIOPLASTY N/A 02/08/2019   Procedure: CORONARY BALLOON ANGIOPLASTY;  Surgeon: Adrian Prows, MD;  Location: [facility];  Service: Cardiovascular;  Laterality: N/A;  . CORONARY STENT INTERVENTION N/A 12/29/2017   Procedure: CORONARY STENT INTERVENTION;  Surgeon: Nigel Mormon, MD;  Location: [facility];  Service: Cardiovascular;  Laterality: N/A;  . CORONARY STENT INTERVENTION N/A 04/26/2018   Procedure: CORONARY STENT INTERVENTION;  Surgeon: Nigel Mormon, MD;  Location: [facility];  Service: Cardiovascular;  Laterality: N/A;  . CORONARY STENT INTERVENTION N/A 02/08/2019  Procedure: CORONARY STENT INTERVENTION;  Surgeon: Adrian Prows, MD;  Location: [facility];  Service: Cardiovascular;  Laterality: N/A;  . ESOPHAGOGASTRODUODENOSCOPY ENDOSCOPY    . FORAMINAL DECOMPRESSION AT L2 TO THE SACRUM  01-05-2008   L2  -  S1  . GANGLION CYST EXCISION Left 01/17/2009   ring finger  . IMPLANTATION PERMANENT SPINAL CORD STIMULATOR  06-15-2008   JUNE 2013--  BATTERY CHANGE  . JOINT REPLACEMENT    . LEFT HEART CATH AND CORONARY ANGIOGRAPHY N/A 04/26/2018   Procedure: LEFT HEART CATH AND CORONARY ANGIOGRAPHY;  Surgeon: Nigel Mormon, MD;   Location: [facility];  Service: Cardiovascular;  Laterality: N/A;  . LEFT HEART CATH AND CORONARY ANGIOGRAPHY N/A 02/08/2019   Procedure: LEFT HEART CATH AND CORONARY ANGIOGRAPHY;  Surgeon: Adrian Prows, MD;  Location: [facility];  Service: Cardiovascular;  Laterality: N/A;  . LIPOMA EXCISION Right    RIGHT ELBOW  . METATARSAL HEAD EXCISION Right 06/16/2018   Procedure: right 5th metatarsal excision, a mini c-arm;  Surgeon: Trula Slade, DPM;  Location: [facility];  Service: Podiatry;  Laterality: Right;  anesthesia can do block  . RIGHT/LEFT HEART CATH AND CORONARY ANGIOGRAPHY N/A 12/29/2017   Procedure: RIGHT/LEFT HEART CATH AND CORONARY ANGIOGRAPHY;  Surgeon: Nigel Mormon, MD;  Location: [facility];  Service: Cardiovascular;  Laterality: N/A;  . SHOULDER OPEN ROTATOR CUFF REPAIR  09/07/2012   Procedure: ROTATOR CUFF REPAIR SHOULDER OPEN;  Surgeon: Magnus Sinning, MD;  Location: [facility];  Service: Orthopedics;  Laterality: Left;  OPEN ANTERIOR ACROMIONECTOMY AND ROTATOR CUFF REPAIR ON LEFT   . SHOULDER OPEN ROTATOR CUFF REPAIR Left 12/28/2012   Procedure: ROTATOR CUFF REPAIR SHOULDER OPEN;  Surgeon: Magnus Sinning, MD;  Location: [facility];  Service: Orthopedics;  Laterality: Left;  OPEN SHOULDER ROTATOR CUFF REPAIR ON LEFT  WITH ANTERIOR ACROMINECTOMY   . SHOULDER OPEN ROTATOR CUFF REPAIR Right 03/28/2003   RIGHT SHOULDER  DEGENERATIVE AC JOINT AND RC TEAR  . SPINAL CORD STIMULATOR BATTERY EXCHANGE N/A 07/03/2016   Procedure: SPINAL CORD STIMULATOR BATTERY PLACMENT;  Surgeon: Melina Schools, MD;  Location: [facility];  Service: Orthopedics;  Laterality: N/A;  . TONSILLECTOMY  AGE 34  . 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 VAGINAL HYSTERECTOMY  1970's    Social History   Socioeconomic History  . Marital status: Married    Spouse name: Not on file  . Number of children: 2  . Years of education: 11th    . Highest education level: Not on file  Occupational History  . Occupation: Retired  Tobacco Use  . Smoking status: Never Smoker  . Smokeless tobacco: Never Used  Substance and Sexual Activity  . Alcohol use: No  . Drug use: No  . Sexual activity: Yes  Other Topics Concern  . Not on file  Social History Narrative   Pt lives with her husband and 1 grown son in [place]. Younger son has muscular dystrophy. Having a difficult time as primary caregiver to son who is not doing well.  Declined respite care/placement.   Caffeine Use: none; very little   Social Determinants of Health   Financial Resource Strain:   . Difficulty of Paying Living Expenses: Not on file  Food Insecurity:   . Worried About Charity fundraiser in the Last Year: Not on file  . Ran Out of Food in the Last Year:  Not on file  Transportation Needs:   . Lack of Transportation (Medical): Not on file  . Lack of Transportation (Non-Medical): Not on file  Physical Activity:   . Days of Exercise per Week: Not on file  . Minutes of Exercise per Session: Not on file  Stress:   . Feeling of Stress : Not on file  Social Connections:   . Frequency of Communication with Friends and Family: Not on file  . Frequency of Social Gatherings with Friends and Family: Not on file  . Attends Religious Services: Not on file  . Active Member of Clubs or Organizations: Not on file  . Attends Archivist Meetings: Not on file  . Marital Status: Not on file  Intimate Partner Violence:   . Fear of Current or Ex-Partner: Not on file  . Emotionally Abused: Not on file  . Physically Abused: Not on file  . Sexually Abused: Not on file    Review of Systems  Constitution: Positive for malaise/fatigue. Negative for chills, decreased appetite and weight gain.  Cardiovascular: Positive for dyspnea on exertion (improved) and leg swelling. Negative for chest pain, orthopnea and syncope.  Respiratory: Negative for cough.   Endocrine:  Negative for cold intolerance.  Hematologic/Lymphatic: Does not bruise/bleed easily.  Musculoskeletal: Negative for joint swelling.  Gastrointestinal: Negative for abdominal pain, anorexia, change in bowel habit, diarrhea and nausea.  Neurological: Positive for loss of balance. Negative for headaches and light-headedness.  Psychiatric/Behavioral: Negative for depression and substance abuse.  All other systems reviewed and are negative.     Objective:  Blood pressure (!) 170/75, pulse 71, temperature 98.2 F (36.8 C), height 5' 5"  (1.651 m), weight 185 lb 1.6 oz (84 kg), SpO2 94 %. Body mass index is 30.8 kg/m.   Vitals with BMI 12/26/2019 12/26/2019 11/30/2019  Height - 5' 5"  -  Weight - 185 lbs 2 oz -  BMI - 33.2 -  Systolic 951 884 166  Diastolic 75 71 80  Pulse 71 79 -      SIX MIN WALK 09/28/2019 09/28/2019  Supplimental Oxygen during Test? (L/min) No No  Laps 4 -  Partial Lap (in Meters) 100 -  Baseline Heartrate 101 -  Baseline SPO2 94 -  Heartrate 115 -  SPO2 82 -  Heartrate 111 -  SPO2 85 -  Stopped or Paused before Six Minutes Yes -  Other Symptoms at end of Exercise pt can not finish 6 min walk and only did 4 min -  Distance Completed 236 -  Provider Comments: pt can not finish 6 min walk and only did 4 min -   Six minute walk test Resting room air = 94% Room air walking = 82% Oxygen 2 Liters while ambulating = 92%   Physical Exam  Constitutional: She appears well-developed and well-nourished. No distress.  HENT:  Head: Atraumatic.  Eyes: Conjunctivae are normal.  Neck: No JVD present. No thyromegaly present.  Cardiovascular: Normal rate, regular rhythm, normal heart sounds and intact distal pulses. Exam reveals no gallop.  No murmur heard. Pulses:      Carotid pulses are 2+ on the right side with bruit and 2+ on the left side.      Femoral pulses are 2+ on the right side and 2+ on the left side.      Popliteal pulses are 0 on the right side and 0 on the  left side.       Dorsalis pedis pulses are 0 on the  right side and 0 on the left side.       Posterior tibial pulses are 0 on the right side and 0 on the left side.  Trace leg edema  Pulmonary/Chest: Effort normal and breath sounds normal.  Abdominal: Soft. Bowel sounds are normal.  Musculoskeletal:        General: No edema. Normal range of motion.     Cervical back: Neck supple.  Neurological: She is alert.  Skin: Skin is warm and dry.  Psychiatric: She has a normal mood and affect.  Vitals reviewed.  Radiology: No results found.  Laboratory examination:  06/10/2019: Creatinine 1.8, potassium 3.9, EGFR 25, CMP otherwise normal.  04/22/2019: Creatinine 1.5, eGFR 33/38, potassium 4.6, CMP otherwise normal. HgbA1c 8.2%   07/08/2018: Cholesterol 166, triglycerides 137, HDL 55, LDL 84.  CMP Latest Ref Rng & Units 11/30/2019 10/11/2019 09/15/2019  Glucose 65 - 99 mg/dL 155(H) 131(H) 212(H)  BUN 8 - 27 mg/dL 26 18 21   Creatinine 0.57 - 1.00 mg/dL 0.94 0.82 1.08(H)  Sodium 134 - 144 mmol/L 142 144 143  Potassium 3.5 - 5.2 mmol/L 4.6 4.2 3.7  Chloride 96 - 106 mmol/L 102 107(H) 106  CO2 20 - 29 mmol/L 26 21 23   Calcium 8.7 - 10.3 mg/dL 9.5 9.5 9.4  Total Protein 6.0 - 8.5 g/dL - - 6.8  Total Bilirubin 0.0 - 1.2 mg/dL - - 0.6  Alkaline Phos 39 - 117 IU/L - - 96  AST 0 - 40 IU/L - - 9  ALT 0 - 32 IU/L - - 10   CBC Latest Ref Rng & Units 06/17/2019 01/21/2019 01/20/2019  WBC 3.4 - 10.8 x10E3/uL 6.7 4.0 4.5  Hemoglobin 11.1 - 15.9 g/dL 13.9 11.1(L) 11.1(L)  Hematocrit 34.0 - 46.6 % 43.7 34.3(L) 34.2(L)  Platelets 150 - 450 x10E3/uL 139(L) 112(L) 106(L)   Lipid Panel     Component Value Date/Time   CHOL 150 12/26/2019 1407   TRIG 87 12/26/2019 1407   HDL 52 12/26/2019 1407   CHOLHDL 2.9 12/26/2019 1407   CHOLHDL 3.9 03/25/2015 0325   VLDL 23 03/25/2015 0325   LDLCALC 82 12/26/2019 1407   LDLDIRECT 90 04/08/2012 1414   HEMOGLOBIN A1C Lab Results  Component Value Date   HGBA1C  7.8 (H) 01/20/2019   MPG 177.16 01/20/2019   TSH Recent Labs    01/20/19 2148 11/30/19 1437  TSH 1.371 3.100    PRN Meds:. There are no discontinued medications. Current Meds  Medication Sig  . aspirin EC 81 MG tablet Take 81 mg by mouth daily.  . busPIRone (BUSPAR) 7.5 MG tablet Take 7.5 mg by mouth 2 (two) times daily.  . Calcium Carb-Cholecalciferol (CALCIUM 600 + D PO) Take 1 tablet by mouth daily.   . clopidogrel (PLAVIX) 75 MG tablet Take 1 tablet (75 mg total) by mouth daily.  . diphenhydrAMINE (BENADRYL) 25 mg capsule Take 25 mg by mouth as needed.  . ezetimibe (ZETIA) 10 MG tablet Take 1 tablet (10 mg total) by mouth daily.  Marland Kitchen FLUoxetine (PROZAC) 20 MG tablet Take 20 mg by mouth daily.  Marland Kitchen glucose blood (ONETOUCH VERIO) test strip 1 each 3 (three) times daily.   . insulin lispro (HUMALOG) 100 UNIT/ML injection Inject 2-6 Units into the skin See admin instructions. For use with V - GO 20 Insulin Delivery Device: inject 6 units subcutaneously three times daily before meals, inject 2 units at bedtime if eating a bedtime snack  . Investigational - Study Medication Take  1 tablet by mouth daily. Study name: dapagliflozin/metformin ER 10/1000mg Additional study details: This is a Drug Study medication from Dr. Nadyne Coombes at [facility]. Patient started taking this medication on 04/22/18 and is unsure of how long she is to take this medication for. Per patient, she is on this medication as part of a Heart and Diabetes drug study.  . isosorbide mononitrate (IMDUR) 60 MG 24 hr tablet Take 60 mg by mouth daily.  Marland Kitchen labetalol (NORMODYNE) 100 MG tablet Take 100 mg by mouth 2 (two) times daily.  Marland Kitchen levothyroxine (SYNTHROID, LEVOTHROID) 50 MCG tablet Take 50 mcg by mouth daily before breakfast.   . losartan (COZAAR) 25 MG tablet Take 1 tablet (25 mg total) by mouth daily.  . metoCLOPramide (REGLAN) 5 MG tablet Take 1 tablet by mouth 3 (three) times daily as needed.   . naproxen (NAPROSYN)  500 MG tablet Take 500 mg by mouth 2 (two) times daily as needed.   . nitroGLYCERIN (NITROSTAT) 0.4 MG SL tablet Place 1 tablet (0.4 mg total) under the tongue every 5 (five) minutes x 3 doses as needed for chest pain.  Glory Rosebush VERIO test strip 1 each 3 (three) times daily.   . pantoprazole (PROTONIX) 40 MG tablet Take 1 tablet (40 mg total) by mouth 2 (two) times daily. (Patient taking differently: Take 40 mg by mouth daily as needed (acid reflux). )  . rosuvastatin (CRESTOR) 40 MG tablet Take 1 tablet (40 mg total) by mouth daily.  Marland Kitchen torsemide (DEMADEX) 20 MG tablet Take 1 tablet (20 mg total) by mouth every other day. Can increase to  Daily if leg swelling (Patient taking differently: Take 20 mg by mouth daily. Can increase to  Twice Daily if leg swelling)  . vitamin B-12 (CYANOCOBALAMIN) 1000 MCG tablet Take 1,000 mcg by mouth daily.   . Vitamin D, Ergocalciferol, (DRISDOL) 50000 units CAPS capsule Take 50,000 Units by mouth every Thursday.     Cardiac Studies:   Echocardiogram 11/30/2019:  Severely depressed LV systolic function with EF 20%. Left ventricle cavity  is mildly dilated at 5.5 cm. Moderate concentric hypertrophy of the left  ventricle. Severe hypokinetic global wall motion. Severe inferolateral  hypokinesis. Doppler evidence of grade II (pseudonormal) diastolic  dysfunction, elevated LAP. Calculated EF 20%.  Left atrial cavity is severely dilated by volume in 4 chamber views and  measures at 4.4 cm in long axis.  Trileaflet aortic valve. Mild to moderate aortic regurgitation.  Mildly restricted posterior mitral valve leaflet with posteriorly directed  moderate to severe mitral regurgitation. Consider papillary muscle  dysfunction.  Structurally normal tricuspid valve. Moderate tricuspid regurgitation.  Moderate to severe pulmonary hypertension. Estimated pulmonary artery  systolic pressure is 64 mm Hg with RA pressure estimated at 15 mm Hg. RVSP  measures 64 mmHg.    Small pericardial effusion with clear fluids.  IVC is dilated with poor inspiration collapse consistent with elevated  right atrial pressure.  No significant change from 05/19/2019.   Carotid artery duplex 12/02/2018: No hemodynamically significant arterial disease in the internal carotid artery bilaterally. Minimal plaque noted bilateral carotid arteries. Antegrade right vertebral artery flow. Antegrade left vertebral artery flow.  Coronary angiogram 02/08/2019: Distal left main 20% diffuse disease. Ostial LAD stent shows diffuse 20% in-stent restenosis (3.0 x 15 mm resolute Onyx on 12/29/2017). Mid LAD and mid to distal LAD there are tandem lesions 80% and 95%. Ramus intermediate ostial 95 to 99% stenosis. Previously placed RI stent widely patent ( 2.0 x  12 mm resolute on 04/26/2018). 30 to 40% tandem lesions in the RCA. Cutting Balloon angioplasty of the ramus intermediate ostial high-grade stenosis, 99% reduced to 0% and Cutting Balloon angioplasty of the mid LAD followed by stenting with 2.5 x 32 mm Synergy DES for high-grade 90% stenosis reduced to 0% and TIMI-3 to TIMI-3 flow maintained in both lesions.  Assessment:     ICD-10-CM   1. Chronic combined systolic and diastolic CHF, NYHA class 3 (HCC)  I50.42   2. Coronary artery disease of native artery of native heart with stable angina pectoris ([place])  I25.118   3. BV ICD Medtronic Claria MRI CRTD  Z95.810   4. Essential hypertension  I10   5. Mixed hyperlipidemia  E78.2 Lipid Profile    EKG 02/09/19: Sinus bradycardia at rate of 55 bpm, left bundle branch block.  No further analysis.  No significant change from prior EKG 02/08/2019.  Scheduled Remote ICD check 12/21/2019:   No AHR or VHR episodes, no mode switches.  Normal BVICD function. Health trends (patient activity, heart rate variability, average heart rates) are stable. Trans-thoracic impedance trends and the Optivol Fluid Index do not suggests fluid  accumulation. Battery longevity is 9.5 years. RA pacing is 2.7 %, RV pacing is 97.6 %, and LV pacing is 97.6 %.  Scheduled  In office ICD 12/27/19 Singl e (S)/Dual (D)/BV (M) M Presenting ASBP Pacer dependant: No. Underlying ASVS @75 /min. AP 4%, BP 95.% AMS Episodes 0.   HVR 0.  Longevity 9.4 Years/Voltage. Charge time Gap [employer]. Lead measurements: Stable Histogram: Low (L)/normal (N)/high (H)  Normal Patient activity Low. Thoracic impedance: Does not suggest fluid overload. Has returned to base after recent increase Nov 3 rd week and back to baseline Jan 4th 2021.   Observations: Normal ICD function.  Changes: None.  Recommendations:   Patient is doing well since last seen 1 month ago. Dyspnea and leg edema have been stable. No clinical evidence of decompensated heart failure. Will continue with Torsemide daily. I have reviewed her recent BMP, fortunately, kidney function has been stable. Her blood pressure is markedly elevated today, but she feels related to her GI illness at not feeling well over the weekend. She will continue to monitor at home and will follow up on this in the next few weeks. No changes were made to medications.  Recent remote ICD transmission was stable. Thoracic impedance has been stable. Encouraged her to continue with her dietary modifications.   Lipids that were performed 3 months ago are noted to be uncontrolled. I will repeat her lipids for follow up and make further changes if needed at that time. May consider PCSK9 or Nexlizet. I will plan to see her back in 4 weeks for follow up on HTN and CHF.    Miquel Dunn, MSN, APRN, FNP-C [facility]. [place] Office: [phone] Fax: [phone]

## 2019-12-27 LAB — LIPID PANEL
Chol/HDL Ratio: 2.9 ratio (ref 0.0–4.4)
Cholesterol, Total: 150 mg/dL (ref 100–199)
HDL: 52 mg/dL (ref >39)
LDL Chol Calc (NIH): 82 mg/dL (ref 0–99)
Triglycerides: 87 mg/dL (ref 0–149)
VLDL Cholesterol Cal: 16 mg/dL (ref 5–40)

## 2019-12-30 ENCOUNTER — Telehealth: Payer: Self-pay

## 2019-12-30 NOTE — Telephone Encounter (Signed)
New message   Patient is calling to check on her device transmission. Please call to discuss.

## 2019-12-30 NOTE — Telephone Encounter (Signed)
I let the pt know we do not follow her remotely and advised her to call [name] office.

## 2020-01-03 NOTE — Progress Notes (Signed)
Number on file is disconnected

## 2020-01-14 ENCOUNTER — Other Ambulatory Visit: Payer: Self-pay | Admitting: Cardiology

## 2020-01-14 DIAGNOSIS — I2510 Atherosclerotic heart disease of native coronary artery without angina pectoris: Secondary | ICD-10-CM

## 2020-01-23 ENCOUNTER — Ambulatory Visit: Payer: Medicare PPO | Admitting: Cardiology

## 2020-02-06 ENCOUNTER — Ambulatory Visit: Payer: Medicare PPO | Admitting: Cardiology

## 2020-02-06 ENCOUNTER — Encounter: Payer: Self-pay | Admitting: Cardiology

## 2020-02-06 ENCOUNTER — Other Ambulatory Visit: Payer: Self-pay

## 2020-02-06 VITALS — BP 152/55 | HR 89 | Temp 97.8°F | Ht 62.0 in | Wt 190.0 lb

## 2020-02-06 DIAGNOSIS — Z9981 Dependence on supplemental oxygen: Secondary | ICD-10-CM

## 2020-02-06 DIAGNOSIS — I447 Left bundle-branch block, unspecified: Secondary | ICD-10-CM

## 2020-02-06 DIAGNOSIS — E1159 Type 2 diabetes mellitus with other circulatory complications: Secondary | ICD-10-CM

## 2020-02-06 DIAGNOSIS — I5042 Chronic combined systolic (congestive) and diastolic (congestive) heart failure: Secondary | ICD-10-CM

## 2020-02-06 DIAGNOSIS — Z955 Presence of coronary angioplasty implant and graft: Secondary | ICD-10-CM

## 2020-02-06 DIAGNOSIS — Z95810 Presence of automatic (implantable) cardiac defibrillator: Secondary | ICD-10-CM

## 2020-02-06 DIAGNOSIS — E782 Mixed hyperlipidemia: Secondary | ICD-10-CM

## 2020-02-06 DIAGNOSIS — I255 Ischemic cardiomyopathy: Secondary | ICD-10-CM

## 2020-02-06 DIAGNOSIS — I10 Essential (primary) hypertension: Secondary | ICD-10-CM

## 2020-02-06 DIAGNOSIS — I2510 Atherosclerotic heart disease of native coronary artery without angina pectoris: Secondary | ICD-10-CM

## 2020-02-06 NOTE — Progress Notes (Signed)
Primary Physician:  [name], PA-C   [name], female    DOB: 04-Apr-1941, 79 y.o.   MRN: [id number]  Subjective:    Chief Complaint  Patient presents with  . Congestive Heart Failure    Follow up  . Hypertension    HPI: Sara Graves  is a 79 y.o. female  with hypertension, uncontrolled type 2 DM, CAD s/p prior LAD and ramus PCI, HFrEF, ischemic cardiomyopathy, s/p BiV ICD implantation 06/2019,  h/o Rt leg cellulitis and metatarsal fracture (managed by Podiatry) most of the office with chief complaint of "heart failure management."   Congestive heart failure: His last office visit patient has not been hospitalized for congestive heart failure.  Her last hospitalization for CHF was back in March 2020.  Patient states that she is compliant with her current guideline directed medical therapy.  Medications reconciled.  Patient states that at the last office visit she was started on metolazone 5 mg p.o. daily and she only has a few pills left.  Patient states that her lower extremity swelling has improved but not resolved.  Lower extremity swelling involving her ankles and lower shins are significantly better but continues to have swelling in the calf bilaterally.  She denies orthopnea or paroxysmal nocturnal dyspnea.  She uses oxygen per nasal cannula intermittently which is helping her underlying shortness of breath.  She does not check her weight on a daily basis.  She is currently on torsemide 20 mg p.o. daily as needed depending on her lower extremity swelling.  Echo revealed new decrease in LVEF and underwent coronary angiogram on 01/27/19 and PCI to LAD  and balloon PTCA to IST RI lesion.   She underwent echocardiogram on 05/19/19 that continued to reveal depressed LVEF of 20-25%.  She was evaluated by Dr. Lovena Le and underwent BiV ICD implantation on 06/21/2019.  She has not had any recent episodes of chest pain. No nitroglycerin use.   Past Medical History:    Diagnosis Date  . Acute combined systolic and diastolic heart failure ([place]) 04/25/2018  . Anxiety   . Arthritis    "hands" (12/29/2017)  . Basal cell carcinoma of nose    removed  . Bursitis of left shoulder   . BV ICD Medtronic Claria MRI CRTD 06/21/2019  . Cat scratch fever    Late 90s  . Chronic combined systolic and diastolic CHF (congestive heart failure) ([place]) 06/08/2019  . Coronary artery disease   . Depression   . Diabetes mellitus, type II, insulin dependent ([place])   . Encounter for assessment of implantable cardioverter-defibrillator (ICD) 09/28/2019  . GERD (gastroesophageal reflux disease)   . H. pylori infection 2008 and 1998   treated  . Hyperlipidemia   . Hypertension   . Hypothyroidism   . Low oxygen saturation   . Multinodular goiter   . Rotator cuff tear, left recurrent   . Urge urinary incontinence   . UTI (lower urinary tract infection) 05/2016    Past Surgical History:  Procedure Laterality Date  . APPENDECTOMY  AGE 45  . BACK SURGERY    . BASAL CELL CARCINOMA EXCISION     "nose"  . BIV ICD INSERTION CRT-D N/A 06/21/2019   Procedure: BIV ICD INSERTION CRT-D;  Surgeon: Evans Lance, MD;  Location: [facility];  Service: Cardiovascular;  Laterality: N/A;  . BLADDER NECK SUSPENSION  1970's  . BUNIONECTOMY WITH HAMMERTOE RECONSTRUCTION Bilateral   . CARPAL TUNNEL RELEASE Right 05/2017  .  COLONOSCOPY W/ POLYPECTOMY    . CORONARY ANGIOPLASTY WITH STENT PLACEMENT  12/29/2017  . CORONARY BALLOON ANGIOPLASTY N/A 02/08/2019   Procedure: CORONARY BALLOON ANGIOPLASTY;  Surgeon: Adrian Prows, MD;  Location: [facility];  Service: Cardiovascular;  Laterality: N/A;  . CORONARY STENT INTERVENTION N/A 12/29/2017   Procedure: CORONARY STENT INTERVENTION;  Surgeon: Nigel Mormon, MD;  Location: [facility];  Service: Cardiovascular;  Laterality: N/A;  . CORONARY STENT INTERVENTION N/A 04/26/2018   Procedure: CORONARY STENT INTERVENTION;  Surgeon:  Nigel Mormon, MD;  Location: [facility];  Service: Cardiovascular;  Laterality: N/A;  . CORONARY STENT INTERVENTION N/A 02/08/2019   Procedure: CORONARY STENT INTERVENTION;  Surgeon: Adrian Prows, MD;  Location: [facility];  Service: Cardiovascular;  Laterality: N/A;  . ESOPHAGOGASTRODUODENOSCOPY ENDOSCOPY    . FORAMINAL DECOMPRESSION AT L2 TO THE SACRUM  01-05-2008   L2  -  S1  . GANGLION CYST EXCISION Left 01/17/2009   ring finger  . IMPLANTATION PERMANENT SPINAL CORD STIMULATOR  06-15-2008   JUNE 2013--  BATTERY CHANGE  . JOINT REPLACEMENT    . LEFT HEART CATH AND CORONARY ANGIOGRAPHY N/A 04/26/2018   Procedure: LEFT HEART CATH AND CORONARY ANGIOGRAPHY;  Surgeon: Nigel Mormon, MD;  Location: [facility];  Service: Cardiovascular;  Laterality: N/A;  . LEFT HEART CATH AND CORONARY ANGIOGRAPHY N/A 02/08/2019   Procedure: LEFT HEART CATH AND CORONARY ANGIOGRAPHY;  Surgeon: Adrian Prows, MD;  Location: [facility];  Service: Cardiovascular;  Laterality: N/A;  . LIPOMA EXCISION Right    RIGHT ELBOW  . METATARSAL HEAD EXCISION Right 06/16/2018   Procedure: right 5th metatarsal excision, a mini c-arm;  Surgeon: Trula Slade, DPM;  Location: [facility];  Service: Podiatry;  Laterality: Right;  anesthesia can do block  . RIGHT/LEFT HEART CATH AND CORONARY ANGIOGRAPHY N/A 12/29/2017   Procedure: RIGHT/LEFT HEART CATH AND CORONARY ANGIOGRAPHY;  Surgeon: Nigel Mormon, MD;  Location: [facility];  Service: Cardiovascular;  Laterality: N/A;  . SHOULDER OPEN ROTATOR CUFF REPAIR  09/07/2012   Procedure: ROTATOR CUFF REPAIR SHOULDER OPEN;  Surgeon: Magnus Sinning, MD;  Location: [facility];  Service: Orthopedics;  Laterality: Left;  OPEN ANTERIOR ACROMIONECTOMY AND ROTATOR CUFF REPAIR ON LEFT   . SHOULDER OPEN ROTATOR CUFF REPAIR Left 12/28/2012   Procedure: ROTATOR CUFF REPAIR SHOULDER OPEN;  Surgeon: Magnus Sinning, MD;  Location:  [facility];  Service: Orthopedics;  Laterality: Left;  OPEN SHOULDER ROTATOR CUFF REPAIR ON LEFT  WITH ANTERIOR ACROMINECTOMY   . SHOULDER OPEN ROTATOR CUFF REPAIR Right 03/28/2003   RIGHT SHOULDER  DEGENERATIVE AC JOINT AND RC TEAR  . SPINAL CORD STIMULATOR BATTERY EXCHANGE N/A 07/03/2016   Procedure: SPINAL CORD STIMULATOR BATTERY PLACMENT;  Surgeon: Melina Schools, MD;  Location: [facility];  Service: Orthopedics;  Laterality: N/A;  . TONSILLECTOMY  AGE 75  . 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 VAGINAL HYSTERECTOMY  1970's   Social History   Tobacco Use  . Smoking status: Never Smoker  . Smokeless tobacco: Never Used  Substance Use Topics  . Alcohol use: No  . Drug use: No    Review of Systems  Constitution: Positive for malaise/fatigue. Negative for chills, decreased appetite and weight gain.  Cardiovascular: Positive for dyspnea on exertion (improved) and leg swelling. Negative for chest pain, orthopnea and syncope.  Respiratory: Negative for cough.  Endocrine: Negative for cold intolerance.  Hematologic/Lymphatic: Does not bruise/bleed easily.  Musculoskeletal: Negative for joint swelling.  Gastrointestinal: Negative for abdominal pain, anorexia, change in bowel habit, diarrhea and nausea.  Neurological: Negative for headaches and light-headedness.  Psychiatric/Behavioral: Negative for depression and substance abuse.  All other systems reviewed and are negative.     Objective:  Blood pressure (!) 152/55, pulse 89, temperature 97.8 F (36.6 C), height 5\' 2"  (1.575 m), weight 190 lb (86.2 kg). Body mass index is 34.75 kg/m.   Vitals with BMI 02/06/2020 12/26/2019 12/26/2019  Height 5\' 2"  - 5\' 5"   Weight 190 lbs - 185 lbs 2 oz  BMI 38.25 - 05.3  Systolic 976 734 193  Diastolic 55 75 71  Pulse 89 71 79   Physical Exam  Constitutional: She appears well-developed and well-nourished. No distress.  HENT:  Head: Atraumatic.  Eyes:  Conjunctivae are normal.  Neck: No JVD present. No thyromegaly present.  Cardiovascular: Normal rate, regular rhythm and intact distal pulses. Exam reveals no gallop.  Murmur heard. Pulses:      Carotid pulses are 2+ on the right side with bruit and 2+ on the left side.      Femoral pulses are 2+ on the right side and 2+ on the left side.      Popliteal pulses are 0 on the right side and 0 on the left side.       Dorsalis pedis pulses are 0 on the right side and 0 on the left side.       Posterior tibial pulses are 0 on the right side and 0 on the left side.  Pacemaker/ICD site is clean dry and intact.  Soft holosystolic murmur heard at the apex.  Pulmonary/Chest: Effort normal and breath sounds normal.  Abdominal: Soft. Bowel sounds are normal.  Musculoskeletal:        General: No edema. Normal range of motion.     Cervical back: Neck supple.  Neurological: She is alert.  Skin: Skin is warm and dry.  Psychiatric: She has a normal mood and affect.  Vitals reviewed.  Radiology: No results found.  Laboratory examination:   CMP Latest Ref Rng & Units 11/30/2019 10/11/2019 09/15/2019  Glucose 65 - 99 mg/dL 155(H) 131(H) 212(H)  BUN 8 - 27 mg/dL 26 18 21   Creatinine 0.57 - 1.00 mg/dL 0.94 0.82 1.08(H)  Sodium 134 - 144 mmol/L 142 144 143  Potassium 3.5 - 5.2 mmol/L 4.6 4.2 3.7  Chloride 96 - 106 mmol/L 102 107(H) 106  CO2 20 - 29 mmol/L 26 21 23   Calcium 8.7 - 10.3 mg/dL 9.5 9.5 9.4  Total Protein 6.0 - 8.5 g/dL - - 6.8  Total Bilirubin 0.0 - 1.2 mg/dL - - 0.6  Alkaline Phos 39 - 117 IU/L - - 96  AST 0 - 40 IU/L - - 9  ALT 0 - 32 IU/L - - 10   CBC Latest Ref Rng & Units 06/17/2019 01/21/2019 01/20/2019  WBC 3.4 - 10.8 x10E3/uL 6.7 4.0 4.5  Hemoglobin 11.1 - 15.9 g/dL 13.9 11.1(L) 11.1(L)  Hematocrit 34.0 - 46.6 % 43.7 34.3(L) 34.2(L)  Platelets 150 - 450 x10E3/uL 139(L) 112(L) 106(L)   Lipid Panel     Component Value Date/Time   CHOL 150 12/26/2019 1407   TRIG 87 12/26/2019  1407   HDL 52 12/26/2019 1407   CHOLHDL 2.9 12/26/2019 1407   CHOLHDL 3.9 03/25/2015 0325   VLDL 23 03/25/2015 0325   LDLCALC 82 12/26/2019  1407   LDLDIRECT 90 04/08/2012 1414   HEMOGLOBIN A1C Lab Results  Component Value Date   HGBA1C 7.8 (H) 01/20/2019   MPG 177.16 01/20/2019   TSH Recent Labs    11/30/19 1437  TSH 3.100    PRN Meds:. Medications Discontinued During This Encounter  Medication Reason  . hydrALAZINE (APRESOLINE) 25 MG tablet Duplicate  . labetalol (NORMODYNE) 100 MG tablet Patient has not taken in last 30 days  . torsemide (DEMADEX) 20 MG tablet Entry Error  . diphenhydrAMINE (BENADRYL) 25 mg capsule Discontinued by provider   Current Meds  Medication Sig  . aspirin EC 81 MG tablet Take 81 mg by mouth daily.  . busPIRone (BUSPAR) 7.5 MG tablet Take 7.5 mg by mouth 2 (two) times daily.  . Calcium Carb-Cholecalciferol (CALCIUM 600 + D PO) Take 1 tablet by mouth daily.   . clopidogrel (PLAVIX) 75 MG tablet Take 1 tablet (75 mg total) by mouth daily.  Marland Kitchen FLUoxetine (PROZAC) 20 MG tablet Take 20 mg by mouth daily.  Marland Kitchen glucose blood (ONETOUCH VERIO) test strip 1 each 3 (three) times daily.   . insulin lispro (HUMALOG) 100 UNIT/ML injection Inject 2-6 Units into the skin See admin instructions. For use with V - GO 20 Insulin Delivery Device: inject 6 units subcutaneously three times daily before meals, inject 2 units at bedtime if eating a bedtime snack  . Investigational - Study Medication Take 1 tablet by mouth daily. Study name: dapagliflozin/metformin ER 10/1000mg  Additional study details: This is a Drug Study medication from Dr. Nadyne Coombes at [facility]. Patient started taking this medication on 04/22/18 and is unsure of how long she is to take this medication for. Per patient, she is on this medication as part of a Heart and Diabetes drug study.  . isosorbide mononitrate (IMDUR) 60 MG 24 hr tablet Take 60 mg by mouth daily.  Marland Kitchen levothyroxine (SYNTHROID,  LEVOTHROID) 50 MCG tablet Take 50 mcg by mouth daily before breakfast.   . losartan (COZAAR) 25 MG tablet Take 1 tablet (25 mg total) by mouth daily.  . metoCLOPramide (REGLAN) 5 MG tablet Take 1 tablet by mouth 3 (three) times daily as needed.   . naproxen (NAPROSYN) 500 MG tablet Take 500 mg by mouth 2 (two) times daily as needed.   . nitroGLYCERIN (NITROSTAT) 0.4 MG SL tablet PLACE 1 TABLET UNDER THE TONGUE EVERY 5 MINUTES X 3 DOSES AS NEEDED FOR CHEST PAIN.  Marland Kitchen ONETOUCH VERIO test strip 1 each 3 (three) times daily.   . pantoprazole (PROTONIX) 40 MG tablet Take 1 tablet (40 mg total) by mouth 2 (two) times daily. (Patient taking differently: Take 40 mg by mouth daily as needed (acid reflux). )  . Study - DAPA TIMI 68 - dapagliflozin (FARXIGA) 10 mg or placebo tablet (PI-Dalton McLean) Take 1 tablet by mouth daily.  Marland Kitchen torsemide (DEMADEX) 20 MG tablet Take 20 mg by mouth in the morning.  . vitamin B-12 (CYANOCOBALAMIN) 1000 MCG tablet Take 1,000 mcg by mouth daily.   . Vitamin D, Ergocalciferol, (DRISDOL) 50000 units CAPS capsule Take 50,000 Units by mouth every Thursday.     Cardiac Studies:   Echocardiogram 11/30/2019: LVEF 20%, left ventricular cavity dilated, moderate concentric LVH, severe hypokinetic global wall motion, grade 2 diastolic impairment, elevated left atrial pressure, severely dilated left atrium, mild to moderate AR, moderate to severe MR, moderate TR, moderate pulmonary hypertension RVSP 64 mmHg, elevated right atrial pressure, small pericardial effusion.  Carotid artery duplex 12/02/2018: No  hemodynamically significant arterial disease in the internal carotid artery bilaterally. Minimal plaque noted bilateral carotid arteries. Antegrade right vertebral artery flow. Antegrade left vertebral artery flow.  Coronary angiogram 02/08/2019: Distal left main 20% diffuse disease. Ostial LAD stent shows diffuse 20% in-stent restenosis (3.0 x 15 mm resolute Onyx on 12/29/2017).  Mid LAD and mid to distal LAD there are tandem lesions 80% and 95%. Ramus intermediate ostial 95 to 99% stenosis. Previously placed RI stent widely patent ( 2.0 x 12 mm resolute on 04/26/2018). 30 to 40% tandem lesions in the RCA. Cutting Balloon angioplasty of the ramus intermediate ostial high-grade stenosis, 99% reduced to 0% and Cutting Balloon angioplasty of the mid LAD followed by stenting with 2.5 x 32 mm Synergy DES for high-grade 90% stenosis reduced to 0% and TIMI-3 to TIMI-3 flow maintained in both lesions.  EKG 02/09/19: Sinus bradycardia at rate of 55 bpm, left bundle branch block.  No further analysis.  No significant change from prior EKG 02/08/2019.  Scheduled Remote ICD check 01/18/2020:   No AHR or VHR episodes, no mode switches.  Normal BVICD function. Health trends (patient activity, heart rate variability, average heart rates) are stable. Trans-thoracic impedance trends and the Optivol Fluid Index do not suggests fluid accumulation. Battery longevity is 9.5 years. RA pacing is 5.6 %, RV pacing is 97.9 %, and LV pacing is 97.9 %.   Assessment:     ICD-10-CM   1. Chronic combined systolic and diastolic CHF (congestive heart failure) (HCC)  I50.42   2. Ischemic cardiomyopathy  I25.5   3. BV ICD Medtronic Claria MRI CRTD  Z95.810   4. Left bundle branch block  I44.7   5. Coronary artery disease involving native coronary artery of native heart without angina pectoris  I25.10   6. Status post primary angioplasty with coronary stent  Z95.5   7. Essential hypertension  I10 EKG 85-IOEV    Basic Metabolic Panel (BMET)    Pro b natriuretic peptide (BNP)[facility])    Magnesium  8. Mixed hyperlipidemia  E78.2 CANCELED: Lipid Panel With LDL/HDL Ratio  9. On supplemental oxygen therapy  Z99.81   10. Type 2 diabetes mellitus with other circulatory complication, with long-term current use of insulin (HCC)  E11.59    Z79.4    Recommendations:  Chronic  combined systolic and diastolic heart failure, stage C, NYHA class II/III:  Ejection Fraction noted on last 2D Echo  Recommend daily weight check, strict I/O's  Fluid restriction to <2L per day, Na restriction < 1.5g per day  Currently on losartan.  Currently on hydralazine and isosorbide mono nitrate combination.  Currently not on spironolactone, will address at the next visit  Currently on torsemide 20 mg p.o. every morning.  She is finishing her prescription of metolazone.  Will not refill at this office visit until we recheck her BMP and magnesium level.  If her BMP and magnesium level are within normal limits will restart Zaroxolyn at 2.5 mg p.o. q. other day.  Per EMR patient is on labetalol; however, this is not one of her medication bottles.  She is unsure if she forgot to bring his bottle in at the time of the office visit.  She asked to bring in all her location bottles were reviewed  Status post Medtronic BiV ICD  Patient currently remains asymptomatic.  Ischemic cardiomyopathy: See above  Biventricular ICD in situ:  Most recent interrogation reviewed.  Continue to monitor  Established coronary artery disease with prior angioplasty  and stents:  Continue aspirin and Plavix.  Medications reconciled.  Most recent echocardiogram results reviewed.  Insulin-dependent diabetes mellitus type 2: Currently the primary team. Long-term insulin use: See above  Mixed hyperlipidemia:  LDL level greater than 70 mg/deciliter.  Reeducated on the importance of lifestyle and dietary changes.  Currently on Crestor 40 mg p.o. nightly and Zetia at 10 mg p.o. daily  Recommend blood work prior to uptitrating guideline directed medical therapy to make sure that her serum creatinine levels are within normal limits as well as her electrolytes.  Patient is asked to follow-up in 2 weeks and to bring all of her medications with her.  Current Outpatient Medications:  .  aspirin EC  81 MG tablet, Take 81 mg by mouth daily., Disp: , Rfl:  .  busPIRone (BUSPAR) 7.5 MG tablet, Take 7.5 mg by mouth 2 (two) times daily., Disp: , Rfl:  .  Calcium Carb-Cholecalciferol (CALCIUM 600 + D PO), Take 1 tablet by mouth daily. , Disp: , Rfl:  .  clopidogrel (PLAVIX) 75 MG tablet, Take 1 tablet (75 mg total) by mouth daily., Disp: 90 tablet, Rfl: 1 .  FLUoxetine (PROZAC) 20 MG tablet, Take 20 mg by mouth daily., Disp: , Rfl:  .  glucose blood (ONETOUCH VERIO) test strip, 1 each 3 (three) times daily. , Disp: , Rfl:  .  insulin lispro (HUMALOG) 100 UNIT/ML injection, Inject 2-6 Units into the skin See admin instructions. For use with V - GO 20 Insulin Delivery Device: inject 6 units subcutaneously three times daily before meals, inject 2 units at bedtime if eating a bedtime snack, Disp: , Rfl:  .  Investigational - Study Medication, Take 1 tablet by mouth daily. Study name: dapagliflozin/metformin ER 10/1000mg  Additional study details: This is a Drug Study medication from Dr. Nadyne Coombes at [facility]. Patient started taking this medication on 04/22/18 and is unsure of how long she is to take this medication for. Per patient, she is on this medication as part of a Heart and Diabetes drug study., Disp: 1 each, Rfl: PRN .  isosorbide mononitrate (IMDUR) 60 MG 24 hr tablet, Take 60 mg by mouth daily., Disp: , Rfl:  .  levothyroxine (SYNTHROID, LEVOTHROID) 50 MCG tablet, Take 50 mcg by mouth daily before breakfast. , Disp: , Rfl:  .  losartan (COZAAR) 25 MG tablet, Take 1 tablet (25 mg total) by mouth daily., Disp: 90 tablet, Rfl: 1 .  metoCLOPramide (REGLAN) 5 MG tablet, Take 1 tablet by mouth 3 (three) times daily as needed. , Disp: , Rfl:  .  naproxen (NAPROSYN) 500 MG tablet, Take 500 mg by mouth 2 (two) times daily as needed. , Disp: , Rfl:  .  nitroGLYCERIN (NITROSTAT) 0.4 MG SL tablet, PLACE 1 TABLET UNDER THE TONGUE EVERY 5 MINUTES X 3 DOSES AS NEEDED FOR CHEST PAIN., Disp: 25 tablet, Rfl:  2 .  ONETOUCH VERIO test strip, 1 each 3 (three) times daily. , Disp: , Rfl: 5 .  pantoprazole (PROTONIX) 40 MG tablet, Take 1 tablet (40 mg total) by mouth 2 (two) times daily. (Patient taking differently: Take 40 mg by mouth daily as needed (acid reflux). ), Disp: 60 tablet, Rfl: 1 .  Study - DAPA TIMI 68 - dapagliflozin (FARXIGA) 10 mg or placebo tablet (PI-Dalton Aundra Dubin), Take 1 tablet by mouth daily., Disp: , Rfl:  .  torsemide (DEMADEX) 20 MG tablet, Take 20 mg by mouth in the morning., Disp: , Rfl:  .  vitamin B-12 (CYANOCOBALAMIN)  1000 MCG tablet, Take 1,000 mcg by mouth daily. , Disp: , Rfl:  .  Vitamin D, Ergocalciferol, (DRISDOL) 50000 units CAPS capsule, Take 50,000 Units by mouth every Thursday. , Disp: , Rfl:  .  ezetimibe (ZETIA) 10 MG tablet, Take 1 tablet (10 mg total) by mouth daily., Disp: 90 tablet, Rfl: 3 .  hydrALAZINE (APRESOLINE) 50 MG tablet, Take 1 tablet (50 mg total) by mouth 3 (three) times daily., Disp: 90 tablet, Rfl: 2 .  rosuvastatin (CRESTOR) 40 MG tablet, Take 1 tablet (40 mg total) by mouth daily., Disp: 90 tablet, Rfl: 3  Orders Placed This Encounter  Procedures  . Basic Metabolic Panel (BMET)  . Pro b natriuretic peptide (BNP)[facility])  . Magnesium  . EKG 12-Lead   --Continue cardiac medications as reconciled in final medication list. --Return in about 2 weeks (around 02/20/2020) for Discussion HF and med changes. Or sooner if needed. --Continue follow-up with your primary care physician regarding the management of your other chronic comorbid conditions.  Patient's questions and concerns were addressed to her satisfaction. She voices understanding of the instructions provided during this encounter.   This note was created using a voice recognition software as a result there may be grammatical errors inadvertently enclosed that do not reflect the nature of this encounter. Every attempt is made to correct such errors.  Rex Kras,  DO, [facility]. [place] Office: [phone]

## 2020-02-06 NOTE — Patient Instructions (Signed)
Please remember to bring in your medication bottles in at the next visit.   Please get labs done at the nearest [place].  Recommend follow up with your PCP as scheduled.

## 2020-02-07 ENCOUNTER — Encounter: Payer: Self-pay | Admitting: Cardiology

## 2020-02-07 ENCOUNTER — Telehealth: Payer: Self-pay

## 2020-02-07 DIAGNOSIS — F411 Generalized anxiety disorder: Secondary | ICD-10-CM

## 2020-02-07 MED ORDER — ALPRAZOLAM 0.25 MG PO TABS: 0.2500 mg | ORAL_TABLET | ORAL | 0 refills | Status: DC

## 2020-02-07 NOTE — Telephone Encounter (Signed)
No abuse potential. Will RX Xanax 30 tablets.     ICD-10-CM   1. Generalized anxiety disorder  F41.1 ALPRAZolam (XANAX) 0.25 MG tablet

## 2020-02-07 NOTE — Telephone Encounter (Signed)
Patient called requesting a something for her nerves, because her son just passed away.  Dr. Terri Skains saw her yesterday, but he said he not yet able to prescribe that. Please advise, so I can call back today. Thank you.

## 2020-02-07 NOTE — Telephone Encounter (Signed)
Sent in Rx

## 2020-02-08 LAB — LIPID PANEL WITH LDL/HDL RATIO
Cholesterol, Total: 171 mg/dL (ref 100–199)
HDL: 55 mg/dL (ref >39)
LDL Chol Calc (NIH): 104 mg/dL — ABNORMAL HIGH (ref 0–99)
LDL/HDL Ratio: 1.9 ratio (ref 0.0–3.2)
Triglycerides: 62 mg/dL (ref 0–149)
VLDL Cholesterol Cal: 12 mg/dL (ref 5–40)

## 2020-02-08 LAB — BASIC METABOLIC PANEL
BUN/Creatinine Ratio: 19 (ref 12–28)
BUN: 20 mg/dL (ref 8–27)
CO2: 25 mmol/L (ref 20–29)
Calcium: 9.3 mg/dL (ref 8.7–10.3)
Chloride: 104 mmol/L (ref 96–106)
Creatinine, Ser: 1.03 mg/dL — ABNORMAL HIGH (ref 0.57–1.00)
GFR calc Af Amer: 60 mL/min/{1.73_m2} (ref >59)
GFR calc non Af Amer: 52 mL/min/{1.73_m2} — ABNORMAL LOW (ref >59)
Glucose: 156 mg/dL — ABNORMAL HIGH (ref 65–99)
Potassium: 4.2 mmol/L (ref 3.5–5.2)
Sodium: 143 mmol/L (ref 134–144)

## 2020-02-08 LAB — MAGNESIUM: Magnesium: 1.8 mg/dL (ref 1.6–2.3)

## 2020-02-08 LAB — PRO B NATRIURETIC PEPTIDE: NT-Pro BNP: 10166 pg/mL — ABNORMAL HIGH (ref 0–738)

## 2020-02-08 NOTE — Telephone Encounter (Signed)
Patient aware.

## 2020-02-18 ENCOUNTER — Other Ambulatory Visit: Payer: Self-pay | Admitting: Cardiology

## 2020-02-18 DIAGNOSIS — I5042 Chronic combined systolic (congestive) and diastolic (congestive) heart failure: Secondary | ICD-10-CM

## 2020-02-22 ENCOUNTER — Other Ambulatory Visit: Payer: Self-pay | Admitting: Cardiology

## 2020-02-23 ENCOUNTER — Ambulatory Visit: Payer: Medicare PPO | Admitting: Cardiology

## 2020-02-23 LAB — BASIC METABOLIC PANEL
BUN/Creatinine Ratio: 29 — ABNORMAL HIGH (ref 12–28)
BUN: 26 mg/dL (ref 8–27)
CO2: 25 mmol/L (ref 20–29)
Calcium: 9.2 mg/dL (ref 8.7–10.3)
Chloride: 102 mmol/L (ref 96–106)
Creatinine, Ser: 0.9 mg/dL (ref 0.57–1.00)
GFR calc Af Amer: 71 mL/min/{1.73_m2} (ref >59)
GFR calc non Af Amer: 61 mL/min/{1.73_m2} (ref >59)
Glucose: 183 mg/dL — ABNORMAL HIGH (ref 65–99)
Potassium: 4.1 mmol/L (ref 3.5–5.2)
Sodium: 141 mmol/L (ref 134–144)

## 2020-02-23 LAB — PRO B NATRIURETIC PEPTIDE: NT-Pro BNP: 5638 pg/mL — ABNORMAL HIGH (ref 0–738)

## 2020-02-23 LAB — MAGNESIUM: Magnesium: 1.5 mg/dL — ABNORMAL LOW (ref 1.6–2.3)

## 2020-02-25 ENCOUNTER — Other Ambulatory Visit: Payer: Self-pay | Admitting: Cardiology

## 2020-02-25 DIAGNOSIS — I5042 Chronic combined systolic (congestive) and diastolic (congestive) heart failure: Secondary | ICD-10-CM

## 2020-02-25 MED ORDER — MAGNESIUM OXIDE -MG SUPPLEMENT 500 MG PO TABS: 500.0000 mg | ORAL_TABLET | Freq: Two times a day (BID) | ORAL | 0 refills | Status: DC

## 2020-02-25 MED ORDER — SPIRONOLACTONE 25 MG PO TABS: 12.5000 mg | ORAL_TABLET | Freq: Every morning | ORAL | 0 refills | Status: DC

## 2020-02-25 NOTE — Progress Notes (Signed)
Spoke to the patient over the phone and informed her of her lab results. -Kidney function and potassium levels at baseline.-Magnesium levels less than optimal.-NT proBNP is trending down and patient states that she has noted improvement in swelling and is able to walk around more.  Patient is asked to take magnesium supplements.  Prescription will be sent to her pharmacy  We will start the patient on Aldactone 12.5 mg p.o. every morning  Blood work in a week to reevaluate kidney function, electrolytes, and NT proBNP.  Patient canceled her appointment on February 23, 2020 as she recently lost her son and is currently grieving.  We will postpone the appointment for at least 1 more month.

## 2020-03-14 NOTE — Telephone Encounter (Signed)
complete

## 2020-03-24 ENCOUNTER — Other Ambulatory Visit: Payer: Self-pay | Admitting: Cardiology

## 2020-03-24 DIAGNOSIS — I2510 Atherosclerotic heart disease of native coronary artery without angina pectoris: Secondary | ICD-10-CM

## 2020-03-28 ENCOUNTER — Other Ambulatory Visit: Payer: Self-pay

## 2020-03-28 ENCOUNTER — Encounter: Payer: Self-pay | Admitting: Cardiology

## 2020-03-28 ENCOUNTER — Ambulatory Visit: Payer: Medicare PPO | Admitting: Cardiology

## 2020-03-28 VITALS — BP 158/78 | HR 74 | Ht 62.0 in | Wt 171.0 lb

## 2020-03-28 DIAGNOSIS — Z95810 Presence of automatic (implantable) cardiac defibrillator: Secondary | ICD-10-CM

## 2020-03-28 DIAGNOSIS — E1159 Type 2 diabetes mellitus with other circulatory complications: Secondary | ICD-10-CM

## 2020-03-28 DIAGNOSIS — Z9981 Dependence on supplemental oxygen: Secondary | ICD-10-CM

## 2020-03-28 DIAGNOSIS — E782 Mixed hyperlipidemia: Secondary | ICD-10-CM

## 2020-03-28 DIAGNOSIS — I2510 Atherosclerotic heart disease of native coronary artery without angina pectoris: Secondary | ICD-10-CM

## 2020-03-28 DIAGNOSIS — I10 Essential (primary) hypertension: Secondary | ICD-10-CM

## 2020-03-28 DIAGNOSIS — I447 Left bundle-branch block, unspecified: Secondary | ICD-10-CM

## 2020-03-28 DIAGNOSIS — I255 Ischemic cardiomyopathy: Secondary | ICD-10-CM

## 2020-03-28 DIAGNOSIS — I5042 Chronic combined systolic (congestive) and diastolic (congestive) heart failure: Secondary | ICD-10-CM

## 2020-03-28 DIAGNOSIS — Z955 Presence of coronary angioplasty implant and graft: Secondary | ICD-10-CM

## 2020-03-28 DIAGNOSIS — Z794 Long term (current) use of insulin: Secondary | ICD-10-CM

## 2020-03-28 MED ORDER — LOSARTAN POTASSIUM 50 MG PO TABS: 50.0000 mg | ORAL_TABLET | Freq: Every evening | ORAL | 0 refills | Status: DC

## 2020-03-28 MED ORDER — SPIRONOLACTONE 25 MG PO TABS: 12.5000 mg | ORAL_TABLET | Freq: Every morning | ORAL | 0 refills | Status: DC

## 2020-03-28 MED ORDER — MAGNESIUM OXIDE -MG SUPPLEMENT 500 MG PO TABS: 500.0000 mg | ORAL_TABLET | Freq: Three times a day (TID) | ORAL | 0 refills | Status: DC

## 2020-03-28 MED ORDER — ROSUVASTATIN CALCIUM 40 MG PO TABS: 40.0000 mg | ORAL_TABLET | Freq: Every day | ORAL | 3 refills | Status: AC

## 2020-03-28 NOTE — Patient Instructions (Signed)
Please remember to bring in your medication bottles in at the next visit.   New Medications that were changed at today's visit:  Increase losartan to 50 mg p.o. every afternoon. Restart spironolactone 12.5 mg p.o. every morning Refill of magnesium  Medications that were discontinued at today's visit: None  Please get labs done in about 2 weeks at the nearest [place] to recheck kidney function electrolytes.  Recommend follow up with your PCP as scheduled.

## 2020-03-28 NOTE — Progress Notes (Signed)
Primary Physician:  [name], PA-C   [name], female    DOB: 09-26-1941, 79 y.o.   MRN: [id number]  Subjective:    Chief Complaint  Patient presents with  . Congestive Heart Failure    HPI: Sara Graves  is a 79 y.o. female  with hypertension, uncontrolled type 2 DM, CAD s/p prior LAD and ramus PCI, HFrEF, ischemic cardiomyopathy, s/p BiV ICD implantation 06/2019,  h/o Rt leg cellulitis and metatarsal fracture (managed by Podiatry) most of the office with chief complaint of "heart failure management."   Congestive heart failure: Sara Graves last office visit patient has not been hospitalized for congestive heart failure.  Her last hospitalization for CHF was back in March 2020.  Patient states that she is compliant with her current guideline directed medical therapy.  Medications reconciled.  Since last office visit we also initiated Aldactone 12.5 mg p.o. every morning.  She tolerated the medication well and a week later had blood work which noted improvement in NT proBNP with stable kidney and electrolytes.  However patient states that she recently ran out of her Aldactone as initially prescribed.  She continues to take torsemide on a daily basis but as needed for lower extremity swelling.  She denies any orthopnea or paroxysmal nocturnal dyspnea.  Functionally patient states that she is doing well since last office visit.   Echo revealed new decrease in LVEF and underwent coronary angiogram on 01/27/19 and PCI to LAD  and balloon PTCA to IST RI lesion.   She underwent echocardiogram on 05/19/19 that continued to reveal depressed LVEF of 20-25%.  She was evaluated by Dr. Lovena Le and underwent BiV ICD implantation on 06/21/2019.  She has not had any recent episodes of chest pain. No nitroglycerin use.   Past Medical History:  Diagnosis Date  . Acute combined systolic and diastolic heart failure ([place]) 04/25/2018  . Anxiety   . Arthritis    "hands" (12/29/2017)  .  Basal cell carcinoma of nose    removed  . Bursitis of left shoulder   . BV ICD Medtronic Claria MRI CRTD 06/21/2019  . Cat scratch fever    Late 90s  . Chronic combined systolic and diastolic CHF (congestive heart failure) ([place]) 06/08/2019  . Coronary artery disease   . Depression   . Diabetes mellitus, type II, insulin dependent ([place])   . Encounter for assessment of implantable cardioverter-defibrillator (ICD) 09/28/2019  . GERD (gastroesophageal reflux disease)   . H. pylori infection 2008 and 1998   treated  . Hyperlipidemia   . Hypertension   . Hypothyroidism   . Low oxygen saturation   . Multinodular goiter   . Rotator cuff tear, left recurrent   . Urge urinary incontinence   . UTI (lower urinary tract infection) 05/2016    Past Surgical History:  Procedure Laterality Date  . APPENDECTOMY  AGE 36  . BACK SURGERY    . BASAL CELL CARCINOMA EXCISION     "nose"  . BIV ICD INSERTION CRT-D N/A 06/21/2019   Procedure: BIV ICD INSERTION CRT-D;  Surgeon: Evans Lance, MD;  Location: [facility];  Service: Cardiovascular;  Laterality: N/A;  . BLADDER NECK SUSPENSION  1970's  . BUNIONECTOMY WITH HAMMERTOE RECONSTRUCTION Bilateral   . CARPAL TUNNEL RELEASE Right 05/2017  . COLONOSCOPY W/ POLYPECTOMY    . CORONARY ANGIOPLASTY WITH STENT PLACEMENT  12/29/2017  . CORONARY BALLOON ANGIOPLASTY N/A 02/08/2019   Procedure: CORONARY BALLOON ANGIOPLASTY;  Surgeon: Adrian Prows, MD;  Location: [facility] [facility];  Service: Cardiovascular;  Laterality: N/A;  . CORONARY STENT INTERVENTION N/A 12/29/2017   Procedure: CORONARY STENT INTERVENTION;  Surgeon: Nigel Mormon, MD;  Location: [place] [facility];  Service: Cardiovascular;  Laterality: N/A;  . CORONARY STENT INTERVENTION N/A 04/26/2018   Procedure: CORONARY STENT INTERVENTION;  Surgeon: Nigel Mormon, MD;  Location: [place] [facility];  Service: Cardiovascular;  Laterality: N/A;  . CORONARY STENT INTERVENTION N/A  02/08/2019   Procedure: CORONARY STENT INTERVENTION;  Surgeon: Adrian Prows, MD;  Location: [place];  Service: Cardiovascular;  Laterality: N/A;  . ESOPHAGOGASTRODUODENOSCOPY ENDOSCOPY    . FORAMINAL DECOMPRESSION AT L2 TO THE SACRUM  01-05-2008   L2  -  S1  . GANGLION CYST EXCISION Left 01/17/2009   ring finger  . IMPLANTATION PERMANENT SPINAL CORD STIMULATOR  06-15-2008   JUNE 2013--  BATTERY CHANGE  . JOINT REPLACEMENT    . LEFT HEART CATH AND CORONARY ANGIOGRAPHY N/A 04/26/2018   Procedure: LEFT HEART CATH AND CORONARY ANGIOGRAPHY;  Surgeon: Nigel Mormon, MD;  Location: [facility] [facility];  Service: Cardiovascular;  Laterality: N/A;  . LEFT HEART CATH AND CORONARY ANGIOGRAPHY N/A 02/08/2019   Procedure: LEFT HEART CATH AND CORONARY ANGIOGRAPHY;  Surgeon: Adrian Prows, MD;  Location: [place] [facility];  Service: Cardiovascular;  Laterality: N/A;  . LIPOMA EXCISION Right    RIGHT ELBOW  . METATARSAL HEAD EXCISION Right 06/16/2018   Procedure: right 5th metatarsal excision, a mini c-arm;  Surgeon: Trula Slade, DPM;  Location: [facility];  Service: Podiatry;  Laterality: Right;  anesthesia can do block  . RIGHT/LEFT HEART CATH AND CORONARY ANGIOGRAPHY N/A 12/29/2017   Procedure: RIGHT/LEFT HEART CATH AND CORONARY ANGIOGRAPHY;  Surgeon: Nigel Mormon, MD;  Location: [place] [facility];  Service: Cardiovascular;  Laterality: N/A;  . SHOULDER OPEN ROTATOR CUFF REPAIR  09/07/2012   Procedure: ROTATOR CUFF REPAIR SHOULDER OPEN;  Surgeon: Magnus Sinning, MD;  Location: [place];  Service: Orthopedics;  Laterality: Left;  OPEN ANTERIOR ACROMIONECTOMY AND ROTATOR CUFF REPAIR ON LEFT   . SHOULDER OPEN ROTATOR CUFF REPAIR Left 12/28/2012   Procedure: ROTATOR CUFF REPAIR SHOULDER OPEN;  Surgeon: Magnus Sinning, MD;  Location: [place];  Service: Orthopedics;  Laterality: Left;  OPEN SHOULDER ROTATOR CUFF REPAIR ON LEFT  WITH ANTERIOR  ACROMINECTOMY   . SHOULDER OPEN ROTATOR CUFF REPAIR Right 03/28/2003   RIGHT SHOULDER  DEGENERATIVE AC JOINT AND RC TEAR  . SPINAL CORD STIMULATOR BATTERY EXCHANGE N/A 07/03/2016   Procedure: SPINAL CORD STIMULATOR BATTERY PLACMENT;  Surgeon: Melina Schools, MD;  Location: [facility];  Service: Orthopedics;  Laterality: N/A;  . TONSILLECTOMY  AGE 6  . 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 VAGINAL HYSTERECTOMY  1970's   Social History   Tobacco Use  . Smoking status: Never Smoker  . Smokeless tobacco: Never Used  Substance Use Topics  . Alcohol use: No  . Drug use: No    Review of Systems  Constitution: Negative for chills, decreased appetite, malaise/fatigue and weight gain.  Cardiovascular: Positive for dyspnea on exertion (improved) and leg swelling. Negative for chest pain, orthopnea and syncope.  Respiratory: Negative for cough.   Endocrine: Negative for cold intolerance.  Hematologic/Lymphatic: Does not bruise/bleed easily.  Musculoskeletal: Negative for joint swelling.  Gastrointestinal: Negative for abdominal pain, anorexia, change in bowel habit, diarrhea  and nausea.  Neurological: Negative for headaches and light-headedness.  Psychiatric/Behavioral: Negative for depression and substance abuse.  All other systems reviewed and are negative.     Objective:  Blood pressure (!) 158/78, pulse 74, height 5\' 2"  (1.575 m), weight 171 lb (77.6 kg), SpO2 98 %. Body mass index is 31.28 kg/m.   Vitals with BMI 03/28/2020 02/06/2020 12/26/2019  Height 5\' 2"  5\' 2"  -  Weight 171 lbs 190 lbs -  BMI 38.25 05.39 -  Systolic 767 341 937  Diastolic 78 55 75  Pulse 74 89 71   Physical Exam  Constitutional: She appears well-developed and well-nourished. No distress.  HENT:  Head: Atraumatic.  Eyes: Conjunctivae are normal.  Neck: No JVD present. No thyromegaly present.  Cardiovascular: Normal rate, regular rhythm and intact distal pulses. Exam reveals no gallop.    Murmur heard. Pulses:      Carotid pulses are 2+ on the right side with bruit and 2+ on the left side.      Femoral pulses are 2+ on the right side and 2+ on the left side.      Popliteal pulses are 0 on the right side and 0 on the left side.       Dorsalis pedis pulses are 0 on the right side and 0 on the left side.       Posterior tibial pulses are 0 on the right side and 0 on the left side.  Pacemaker/ICD site is clean dry and intact.  Soft holosystolic murmur heard at the apex.  Pulmonary/Chest: Effort normal and breath sounds normal.  Abdominal: Soft. Bowel sounds are normal.  Musculoskeletal:        General: No edema. Normal range of motion.     Cervical back: Neck supple.  Neurological: She is alert.  Skin: Skin is warm and dry.  Psychiatric: She has a normal mood and affect.  Vitals reviewed.  Radiology: No results found.  Laboratory examination:   CMP Latest Ref Rng & Units 02/22/2020 02/07/2020 11/30/2019  Glucose 65 - 99 mg/dL 183(H) 156(H) 155(H)  BUN 8 - 27 mg/dL 26 20 26   Creatinine 0.57 - 1.00 mg/dL 0.90 1.03(H) 0.94  Sodium 134 - 144 mmol/L 141 143 142  Potassium 3.5 - 5.2 mmol/L 4.1 4.2 4.6  Chloride 96 - 106 mmol/L 102 104 102  CO2 20 - 29 mmol/L 25 25 26   Calcium 8.7 - 10.3 mg/dL 9.2 9.3 9.5  Total Protein 6.0 - 8.5 g/dL - - -  Total Bilirubin 0.0 - 1.2 mg/dL - - -  Alkaline Phos 39 - 117 IU/L - - -  AST 0 - 40 IU/L - - -  ALT 0 - 32 IU/L - - -   CBC Latest Ref Rng & Units 06/17/2019 01/21/2019 01/20/2019  WBC 3.4 - 10.8 x10E3/uL 6.7 4.0 4.5  Hemoglobin 11.1 - 15.9 g/dL 13.9 11.1(L) 11.1(L)  Hematocrit 34.0 - 46.6 % 43.7 34.3(L) 34.2(L)  Platelets 150 - 450 x10E3/uL 139(L) 112(L) 106(L)   Lipid Panel     Component Value Date/Time   CHOL 171 02/07/2020 1524   TRIG 62 02/07/2020 1524   HDL 55 02/07/2020 1524   CHOLHDL 2.9 12/26/2019 1407   CHOLHDL 3.9 03/25/2015 0325   VLDL 23 03/25/2015 0325   LDLCALC 104 (H) 02/07/2020 1524   LDLDIRECT 90 04/08/2012  1414   HEMOGLOBIN A1C Lab Results  Component Value Date   HGBA1C 7.8 (H) 01/20/2019   MPG 177.16 01/20/2019   TSH  Recent Labs    11/30/19 1437  TSH 3.100    PRN Meds:. Medications Discontinued During This Encounter  Medication Reason  . rosuvastatin (CRESTOR) 40 MG tablet   . spironolactone (ALDACTONE) 25 MG tablet Reorder  . Magnesium Oxide 500 MG TABS   . losartan (COZAAR) 25 MG tablet    Current Meds  Medication Sig  . ALPRAZolam (XANAX) 0.25 MG tablet Take 1 tablet (0.25 mg total) by mouth as directed. 1/2 tablet r twice daily or one tab at night for anxiety  . aspirin EC 81 MG tablet Take 81 mg by mouth daily.  . Calcium Carb-Cholecalciferol (CALCIUM 600 + D PO) Take 1 tablet by mouth daily.   . clopidogrel (PLAVIX) 75 MG tablet Take 1 tablet (75 mg total) by mouth daily.  Marland Kitchen ezetimibe (ZETIA) 10 MG tablet Take 1 tablet (10 mg total) by mouth daily.  Marland Kitchen FLUoxetine (PROZAC) 20 MG tablet Take 20 mg by mouth daily.  Marland Kitchen glucose blood (ONETOUCH VERIO) test strip 1 each 3 (three) times daily.   . hydrALAZINE (APRESOLINE) 50 MG tablet Take 1 tablet (50 mg total) by mouth 3 (three) times daily.  . insulin lispro (HUMALOG) 100 UNIT/ML injection Inject 2-6 Units into the skin See admin instructions. For use with V - GO 20 Insulin Delivery Device: inject 6 units subcutaneously three times daily before meals, inject 2 units at bedtime if eating a bedtime snack  . Investigational - Study Medication Take 1 tablet by mouth daily. Study name: dapagliflozin/metformin ER 10/1000mg  Additional study details: This is a Drug Study medication from Dr[facility] Nadyne Coombes at [facility]. Patient started taking this medication on 04/22/18 and is unsure of how long she is to take this medication for. Per patient, she is on this medication as part of a Heart and Diabetes drug study.  . isosorbide mononitrate (IMDUR) 60 MG 24 hr tablet Take 60 mg by mouth daily.  Marland Kitchen levothyroxine (SYNTHROID, LEVOTHROID) 50 MCG  tablet Take 50 mcg by mouth daily before breakfast.   . losartan (COZAAR) 50 MG tablet Take 1 tablet (50 mg total) by mouth every evening.  . Magnesium Oxide 500 MG TABS Take 1 tablet (500 mg total) by mouth 3 (three) times daily.  . metoCLOPramide (REGLAN) 5 MG tablet Take 1 tablet by mouth 3 (three) times daily as needed.   . naproxen (NAPROSYN) 500 MG tablet Take 500 mg by mouth 2 (two) times daily as needed.   . nitroGLYCERIN (NITROSTAT) 0.4 MG SL tablet PLACE 1 TABLET UNDER THE TONGUE EVERY 5 MINUTES X 3 DOSES AS NEEDED FOR CHEST PAIN.  Marland Kitchen ONETOUCH VERIO test strip 1 each 3 (three) times daily.   . pantoprazole (PROTONIX) 40 MG tablet Take 1 tablet (40 mg total) by mouth 2 (two) times daily. (Patient taking differently: Take 40 mg by mouth daily as needed (acid reflux). )  . rosuvastatin (CRESTOR) 40 MG tablet Take 1 tablet (40 mg total) by mouth at bedtime.  . Study - DAPA TIMI 68 - dapagliflozin (FARXIGA) 10 mg or placebo tablet (PI-Dalton McLean) Take 1 tablet by mouth daily.  Marland Kitchen torsemide (DEMADEX) 20 MG tablet Take 20 mg by mouth daily as needed.  . vitamin B-12 (CYANOCOBALAMIN) 1000 MCG tablet Take 1,000 mcg by mouth daily.   . Vitamin D, Ergocalciferol, (DRISDOL) 50000 units CAPS capsule Take 50,000 Units by mouth every Thursday.   . [DISCONTINUED] losartan (COZAAR) 25 MG tablet Take 1 tablet (25 mg total) by mouth daily.  . [DISCONTINUED]  Magnesium Oxide 500 MG TABS Take 1 tablet (500 mg total) by mouth in the morning and at bedtime.  . [DISCONTINUED] rosuvastatin (CRESTOR) 40 MG tablet Take 1 tablet (40 mg total) by mouth daily.    Cardiac Studies:   Echocardiogram 11/30/2019: LVEF 20%, left ventricular cavity dilated, moderate concentric LVH, severe hypokinetic global wall motion, grade 2 diastolic impairment, elevated left atrial pressure, severely dilated left atrium, mild to moderate AR, moderate to severe MR, moderate TR, moderate pulmonary hypertension RVSP 64 mmHg, elevated  right atrial pressure, small pericardial effusion.  Carotid artery duplex 12/02/2018: No hemodynamically significant arterial disease in the internal carotid artery bilaterally. Minimal plaque noted bilateral carotid arteries. Antegrade right vertebral artery flow. Antegrade left vertebral artery flow.  Coronary angiogram 02/08/2019: Distal left main 20% diffuse disease. Ostial LAD stent shows diffuse 20% in-stent restenosis (3.0 x 15 mm resolute Onyx on 12/29/2017). Mid LAD and mid to distal LAD there are tandem lesions 80% and 95%. Ramus intermediate ostial 95 to 99% stenosis. Previously placed RI stent widely patent ( 2.0 x 12 mm resolute on 04/26/2018). 30 to 40% tandem lesions in the RCA. Cutting Balloon angioplasty of the ramus intermediate ostial high-grade stenosis, 99% reduced to 0% and Cutting Balloon angioplasty of the mid LAD followed by stenting with 2.5 x 32 mm Synergy DES for high-grade 90% stenosis reduced to 0% and TIMI-3 to TIMI-3 flow maintained in both lesions.  EKG 02/09/19: Sinus bradycardia at rate of 55 bpm, left bundle branch block.  No further analysis.  No significant change from prior EKG 02/08/2019.  Scheduled Remote ICD check 01/18/2020:   No AHR or VHR episodes, no mode switches.  Normal BVICD function. Health trends (patient activity, heart rate variability, average heart rates) are stable. Trans-thoracic impedance trends and the Optivol Fluid Index do not suggests fluid accumulation. Battery longevity is 9.5 years. RA pacing is 5.6 %, RV pacing is 97.9 %, and LV pacing is 97.9 %.   Assessment:     ICD-10-CM   1. Chronic combined systolic and diastolic CHF (congestive heart failure) (HCC)  I50.42 spironolactone (ALDACTONE) 25 MG tablet    Magnesium Oxide 500 MG TABS    Basic metabolic panel    Magnesium    Pro b natriuretic peptide ([facility])    Magnesium    Basic metabolic panel    Pro b natriuretic peptide  (BNP)[facility] [facility])  2. Ischemic cardiomyopathy  I25.5 losartan (COZAAR) 50 MG tablet    Basic metabolic panel    Magnesium    Pro b natriuretic peptide (BNP)[facility] [facility])    Magnesium    Basic metabolic panel    Pro b natriuretic peptide (BNP)[facility] [facility])  3. Hypomagnesemia  E83.42 Magnesium Oxide 500 MG TABS  4. Mixed hyperlipidemia  E78.2 rosuvastatin (CRESTOR) 40 MG tablet  5. BV ICD Medtronic Claria MRI CRTD  Z95.810   6. Left bundle branch block  I44.7   7. Coronary artery disease involving native coronary artery of native heart without angina pectoris  I25.10   8. Status post primary angioplasty with coronary stent  Z95.5   9. Essential hypertension  I10   10. On supplemental oxygen therapy  Z99.81   11. Type 2 diabetes mellitus with other circulatory complication, with long-term current use of insulin (HCC)  E11.59    Z79.4    Recommendations:  Chronic combined systolic and diastolic heart failure, stage C, NYHA class II/III:  Ejection Fraction noted on  last 2D Echo  Recommend daily weight check, strict I/O's  Fluid restriction to <2L per day, Na restriction < 1.5g per day  Increase losartan to 50 mg p.o. every afternoon.  At the next visit consider transitioning to [facility].  Currently on hydralazine and isosorbide mono nitrate combination.  Reinitiated Aldactone 12.5 mg p.o. daily  Currently on torsemide 20 mg p.o. every morning as needed basis.   Continue Farxiga  Status post Medtronic BiV ICD  Patient currently remains asymptomatic.  Since the up titration of losartan and reinitiating Aldactone will check BMP and magnesium in 1 week.  Ischemic cardiomyopathy: See above  Biventricular ICD in situ:  Most recent interrogation reviewed.  Continue to monitor  Established coronary artery disease with prior angioplasty and stents:  Continue aspirin and Plavix.  Medications reconciled.  Most  recent echocardiogram results reviewed.  Hypomagnesemia: Magnesium refilled  Insulin-dependent diabetes mellitus type 2: Currently the primary team. Long-term insulin use: See above  Mixed hyperlipidemia:  LDL level greater than 70 mg/deciliter.  Reeducated on the importance of lifestyle and dietary changes.  Currently on Crestor 40 mg p.o. nightly and Zetia at 10 mg p.o. daily  Refilled Crestor  Current Outpatient Medications:  .  ALPRAZolam (XANAX) 0.25 MG tablet, Take 1 tablet (0.25 mg total) by mouth as directed. 1/2 tablet r twice daily or one tab at night for anxiety, Disp: 30 tablet, Rfl: 0 .  aspirin EC 81 MG tablet, Take 81 mg by mouth daily., Disp: , Rfl:  .  Calcium Carb-Cholecalciferol (CALCIUM 600 + D PO), Take 1 tablet by mouth daily. , Disp: , Rfl:  .  clopidogrel (PLAVIX) 75 MG tablet, Take 1 tablet (75 mg total) by mouth daily., Disp: 90 tablet, Rfl: 1 .  ezetimibe (ZETIA) 10 MG tablet, Take 1 tablet (10 mg total) by mouth daily., Disp: 90 tablet, Rfl: 3 .  FLUoxetine (PROZAC) 20 MG tablet, Take 20 mg by mouth daily., Disp: , Rfl:  .  glucose blood (ONETOUCH VERIO) test strip, 1 each 3 (three) times daily. , Disp: , Rfl:  .  hydrALAZINE (APRESOLINE) 50 MG tablet, Take 1 tablet (50 mg total) by mouth 3 (three) times daily., Disp: 90 tablet, Rfl: 2 .  insulin lispro (HUMALOG) 100 UNIT/ML injection, Inject 2-6 Units into the skin See admin instructions. For use with V - GO 20 Insulin Delivery Device: inject 6 units subcutaneously three times daily before meals, inject 2 units at bedtime if eating a bedtime snack, Disp: , Rfl:  .  Investigational - Study Medication, Take 1 tablet by mouth daily. Study name: dapagliflozin/metformin ER 10/1000mg  Additional study details: This is a Drug Study medication from Dr. Nadyne Coombes at [facility]. Patient started taking this medication on 04/22/18 and is unsure of how long she is to take this medication for. Per patient, she is on this  medication as part of a Heart and Diabetes drug study., Disp: 1 each, Rfl: PRN .  isosorbide mononitrate (IMDUR) 60 MG 24 hr tablet, Take 60 mg by mouth daily., Disp: , Rfl:  .  levothyroxine (SYNTHROID, LEVOTHROID) 50 MCG tablet, Take 50 mcg by mouth daily before breakfast. , Disp: , Rfl:  .  losartan (COZAAR) 50 MG tablet, Take 1 tablet (50 mg total) by mouth every evening., Disp: 90 tablet, Rfl: 0 .  Magnesium Oxide 500 MG TABS, Take 1 tablet (500 mg total) by mouth 3 (three) times daily., Disp: 270 tablet, Rfl: 0 .  metoCLOPramide (REGLAN) 5 MG tablet, Take  1 tablet by mouth 3 (three) times daily as needed. , Disp: , Rfl:  .  naproxen (NAPROSYN) 500 MG tablet, Take 500 mg by mouth 2 (two) times daily as needed. , Disp: , Rfl:  .  nitroGLYCERIN (NITROSTAT) 0.4 MG SL tablet, PLACE 1 TABLET UNDER THE TONGUE EVERY 5 MINUTES X 3 DOSES AS NEEDED FOR CHEST PAIN., Disp: 25 tablet, Rfl: 2 .  ONETOUCH VERIO test strip, 1 each 3 (three) times daily. , Disp: , Rfl: 5 .  pantoprazole (PROTONIX) 40 MG tablet, Take 1 tablet (40 mg total) by mouth 2 (two) times daily. (Patient taking differently: Take 40 mg by mouth daily as needed (acid reflux). ), Disp: 60 tablet, Rfl: 1 .  rosuvastatin (CRESTOR) 40 MG tablet, Take 1 tablet (40 mg total) by mouth at bedtime., Disp: 90 tablet, Rfl: 3 .  Study - DAPA TIMI 68 - dapagliflozin (FARXIGA) 10 mg or placebo tablet (PI-Dalton Aundra Dubin), Take 1 tablet by mouth daily., Disp: , Rfl:  .  torsemide (DEMADEX) 20 MG tablet, Take 20 mg by mouth daily as needed., Disp: , Rfl:  .  vitamin B-12 (CYANOCOBALAMIN) 1000 MCG tablet, Take 1,000 mcg by mouth daily. , Disp: , Rfl:  .  Vitamin D, Ergocalciferol, (DRISDOL) 50000 units CAPS capsule, Take 50,000 Units by mouth every Thursday. , Disp: , Rfl:  .  busPIRone (BUSPAR) 7.5 MG tablet, Take 7.5 mg by mouth 2 (two) times daily., Disp: , Rfl:  .  spironolactone (ALDACTONE) 25 MG tablet, Take 0.5 tablets (12.5 mg total) by mouth in the  morning for 180 doses., Disp: 90 tablet, Rfl: 0  Orders Placed This Encounter  Procedures  . Basic metabolic panel  . Magnesium  . Pro b natriuretic peptide (BNP)[facility] [facility])   --Continue cardiac medications as reconciled in final medication list. --Return in about 2 months (around 05/28/2020). Or sooner if needed. --Continue follow-up with your primary care physician regarding the management of your other chronic comorbid conditions.  Patient's questions and concerns were addressed to her satisfaction. She voices understanding of the instructions provided during this encounter.   This note was created using a voice recognition software as a result there may be grammatical errors inadvertently enclosed that do not reflect the nature of this encounter. Every attempt is made to correct such errors.  Rex Kras, [place], [facility]  Pager: [phone] Office: [phone]

## 2020-04-01 ENCOUNTER — Telehealth: Payer: Self-pay | Admitting: Cardiology

## 2020-04-02 NOTE — Telephone Encounter (Signed)
Called and spoke with patient regarding her ICD transmission results.

## 2020-04-05 LAB — BASIC METABOLIC PANEL
BUN/Creatinine Ratio: 25 (ref 12–28)
BUN: 23 mg/dL (ref 8–27)
CO2: 23 mmol/L (ref 20–29)
Calcium: 9.3 mg/dL (ref 8.7–10.3)
Chloride: 107 mmol/L — ABNORMAL HIGH (ref 96–106)
Creatinine, Ser: 0.93 mg/dL (ref 0.57–1.00)
GFR calc Af Amer: 68 mL/min/{1.73_m2} (ref >59)
GFR calc non Af Amer: 59 mL/min/{1.73_m2} — ABNORMAL LOW (ref >59)
Glucose: 120 mg/dL — ABNORMAL HIGH (ref 65–99)
Potassium: 3.8 mmol/L (ref 3.5–5.2)
Sodium: 146 mmol/L — ABNORMAL HIGH (ref 134–144)

## 2020-04-05 LAB — MAGNESIUM: Magnesium: 1.9 mg/dL (ref 1.6–2.3)

## 2020-04-10 ENCOUNTER — Telehealth: Payer: Self-pay

## 2020-04-10 NOTE — Telephone Encounter (Signed)
The patient called and said that she has oozing blisters on her legs and some of them are bleeding

## 2020-04-10 NOTE — Telephone Encounter (Signed)
Does she have swelling in her legs? Any CP or shortness of breath? No, please have her follow up with PCP and if she had any of the above symptoms I can see her tomorrow.

## 2020-04-11 LAB — PRO B NATRIURETIC PEPTIDE

## 2020-04-12 ENCOUNTER — Other Ambulatory Visit: Payer: Self-pay

## 2020-04-12 ENCOUNTER — Ambulatory Visit
Admission: EM | Admit: 2020-04-12 | Discharge: 2020-04-12 | Disposition: A | Discharge status: Home / Self Care | Payer: Medicare PPO

## 2020-04-12 DIAGNOSIS — L139 Bullous disorder, unspecified: Secondary | ICD-10-CM

## 2020-04-12 LAB — PRO B NATRIURETIC PEPTIDE: NT-Pro BNP: 6821 pg/mL — ABNORMAL HIGH (ref 0–738)

## 2020-04-12 LAB — SPECIMEN STATUS REPORT

## 2020-04-12 MED ORDER — BETAMETHASONE VALERATE 0.1 % EX OINT
1.0000 | TOPICAL_OINTMENT | Freq: Two times a day (BID) | CUTANEOUS | 1 refills | Status: DC
Start: 2020-04-12 — End: 2021-02-12

## 2020-04-12 MED ORDER — DOXYCYCLINE HYCLATE 100 MG PO CAPS: 100.0000 mg | ORAL_CAPSULE | Freq: Two times a day (BID) | ORAL | 0 refills | Status: AC

## 2020-04-12 NOTE — Discharge Instructions (Signed)
Take antibiotic twice daily with food. Apply Valisone ointment twice daily. Change dressing once daily. Follow-up with primary care Monday, keep cardiology appointment Tuesday. Return for worsening pain, weeping, fever.

## 2020-04-12 NOTE — ED Triage Notes (Addendum)
Pt c/o draining blisters to bilateral lower legs with swelling to L leg x 1 week, hx of CHF, reports taking all meds as prescribed and denies any increasing SOB

## 2020-04-12 NOTE — ED Provider Notes (Signed)
[facility]    CSN: [id number] Arrival date & time: 04/12/20  1536      History   Chief Complaint Chief Complaint  Patient presents with  . Blister    HPI Sara Graves is a 79 y.o. female with extensive medical history as outlined below including type 2 diabetes, hypertension, CHF presenting for bilateral leg blistering x1 week.  Patient denying increased lower extremity edema, chest pain, dyspnea, lightheadedness or fatigue.  No recent change in medications.  States sugars are well controlled at home.  Has kept blisters covered, though one on left lower leg burst the other day.  Denies foreign body, inciting event.  No change in topical products, known bug bite.   Past Medical History:  Diagnosis Date  . Acute combined systolic and diastolic heart failure ([place]) 04/25/2018  . Anxiety   . Arthritis    "hands" (12/29/2017)  . Basal cell carcinoma of nose    removed  . Bursitis of left shoulder   . BV ICD Medtronic Claria MRI CRTD 06/21/2019  . Cat scratch fever    Late 90s  . Chronic combined systolic and diastolic CHF (congestive heart failure) ([place]) 06/08/2019  . Coronary artery disease   . Depression   . Diabetes mellitus, type II, insulin dependent ([place])   . Encounter for assessment of implantable cardioverter-defibrillator (ICD) 09/28/2019  . GERD (gastroesophageal reflux disease)   . H. pylori infection 2008 and 1998   treated  . Hyperlipidemia   . Hypertension   . Hypothyroidism   . Low oxygen saturation   . Multinodular goiter   . Rotator cuff tear, left recurrent   . Urge urinary incontinence   . UTI (lower urinary tract infection) 05/2016    Patient Active Problem List   Diagnosis Date Noted  . Encounter for assessment of implantable cardioverter-defibrillator (ICD) 09/28/2019  . Ischemic cardiomyopathy 09/28/2019  . Chronic systolic (congestive) heart failure ([place]) 06/21/2019  . ICD Medtronic Claria MRI CRTD 06/21/2019  . Nonischemic  cardiomyopathy ([place]) 06/17/2019  . Left bundle branch block 06/17/2019  . Chronic combined systolic and diastolic CHF (congestive heart failure) ([place]) 06/08/2019  . Chronic kidney disease (CKD), stage III (moderate) 05/12/2019  . Acute respiratory failure with hypoxia ([place]) 01/20/2019  . 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 Anxiety and depression 01/21/2018  . Coronary artery disease involving native coronary artery of native heart without angina pectoris 01/21/2018  . Gastroesophageal reflux disease without esophagitis 01/21/2018  . 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 Dyspnea on exertion 12/27/2017  . Essential hypertension 03/24/2015  . Type 2 diabetes mellitus ([place]) 01/10/2015  . Hyperlipidemia 10/24/2014  . Chronic back pain 05/30/2014  . Skin cancer, basal cell 05/30/2014  . Protein-calorie malnutrition, severe ([place]) 05/16/2014  . Unspecified vitamin D deficiency 05/09/2014  . Osteopenia 04/06/2014  . Neuropathy of leg 04/27/2013  . Weight loss, abnormal 03/16/2013  . Left rotator cuff tear 12/29/2012  . Baker's cyst 04/09/2012  . UNSPECIFIED VENOUS INSUFFICIENCY 09/20/2010  . Depression 02/04/2007  . Major depressive disorder, single episode 02/04/2007  . Hypothyroidism 01/14/2007  . DM (diabetes mellitus), type 2, uncontrolled ([place]) 01/14/2007  . HYPERCHOLESTEROLEMIA 01/14/2007  . HYPERTENSION, BENIGN SYSTEMIC 01/14/2007  . RHINITIS, ALLERGIC 01/14/2007  . ARTHRITIS 01/14/2007  . SCIATICA 01/14/2007    Past Surgical History:  Procedure Laterality Date  . APPENDECTOMY  AGE 76  . BACK SURGERY    . BASAL CELL CARCINOMA EXCISION     "  nose"  . BIV ICD INSERTION CRT-D N/A 06/21/2019   Procedure: BIV ICD INSERTION CRT-D;  Surgeon: Evans Lance, MD;  Location: [facility];  Service: Cardiovascular;  Laterality: N/A;  . BLADDER NECK SUSPENSION  1970's  . BUNIONECTOMY WITH HAMMERTOE RECONSTRUCTION Bilateral   . CARPAL TUNNEL  RELEASE Right 05/2017  . COLONOSCOPY W/ POLYPECTOMY    . CORONARY ANGIOPLASTY WITH STENT PLACEMENT  12/29/2017  . CORONARY BALLOON ANGIOPLASTY N/A 02/08/2019   Procedure: CORONARY BALLOON ANGIOPLASTY;  Surgeon: Adrian Prows, MD;  Location: [facility];  Service: Cardiovascular;  Laterality: N/A;  . CORONARY STENT INTERVENTION N/A 12/29/2017   Procedure: CORONARY STENT INTERVENTION;  Surgeon: Nigel Mormon, MD;  Location: [facility];  Service: Cardiovascular;  Laterality: N/A;  . CORONARY STENT INTERVENTION N/A 04/26/2018   Procedure: CORONARY STENT INTERVENTION;  Surgeon: Nigel Mormon, MD;  Location: [facility];  Service: Cardiovascular;  Laterality: N/A;  . CORONARY STENT INTERVENTION N/A 02/08/2019   Procedure: CORONARY STENT INTERVENTION;  Surgeon: Adrian Prows, MD;  Location: [facility];  Service: Cardiovascular;  Laterality: N/A;  . ESOPHAGOGASTRODUODENOSCOPY ENDOSCOPY    . FORAMINAL DECOMPRESSION AT L2 TO THE SACRUM  01-05-2008   L2  -  S1  . GANGLION CYST EXCISION Left 01/17/2009   ring finger  . IMPLANTATION PERMANENT SPINAL CORD STIMULATOR  06-15-2008   JUNE 2013--  BATTERY CHANGE  . JOINT REPLACEMENT    . LEFT HEART CATH AND CORONARY ANGIOGRAPHY N/A 04/26/2018   Procedure: LEFT HEART CATH AND CORONARY ANGIOGRAPHY;  Surgeon: Nigel Mormon, MD;  Location: [facility];  Service: Cardiovascular;  Laterality: N/A;  . LEFT HEART CATH AND CORONARY ANGIOGRAPHY N/A 02/08/2019   Procedure: LEFT HEART CATH AND CORONARY ANGIOGRAPHY;  Surgeon: Adrian Prows, MD;  Location: [facility];  Service: Cardiovascular;  Laterality: N/A;  . LIPOMA EXCISION Right    RIGHT ELBOW  . METATARSAL HEAD EXCISION Right 06/16/2018   Procedure: right 5th metatarsal excision, a mini c-arm;  Surgeon: Trula Slade, DPM;  Location: [facility];  Service: Podiatry;  Laterality: Right;  anesthesia can do block  . RIGHT/LEFT HEART CATH AND CORONARY ANGIOGRAPHY N/A  12/29/2017   Procedure: RIGHT/LEFT HEART CATH AND CORONARY ANGIOGRAPHY;  Surgeon: Nigel Mormon, MD;  Location: [facility];  Service: Cardiovascular;  Laterality: N/A;  . SHOULDER OPEN ROTATOR CUFF REPAIR  09/07/2012   Procedure: ROTATOR CUFF REPAIR SHOULDER OPEN;  Surgeon: Magnus Sinning, MD;  Location: [facility];  Service: Orthopedics;  Laterality: Left;  OPEN ANTERIOR ACROMIONECTOMY AND ROTATOR CUFF REPAIR ON LEFT   . SHOULDER OPEN ROTATOR CUFF REPAIR Left 12/28/2012   Procedure: ROTATOR CUFF REPAIR SHOULDER OPEN;  Surgeon: Magnus Sinning, MD;  Location: [facility];  Service: Orthopedics;  Laterality: Left;  OPEN SHOULDER ROTATOR CUFF REPAIR ON LEFT  WITH ANTERIOR ACROMINECTOMY   . SHOULDER OPEN ROTATOR CUFF REPAIR Right 03/28/2003   RIGHT SHOULDER  DEGENERATIVE AC JOINT AND RC TEAR  . SPINAL CORD STIMULATOR BATTERY EXCHANGE N/A 07/03/2016   Procedure: SPINAL CORD STIMULATOR BATTERY PLACMENT;  Surgeon: Melina Schools, MD;  Location: [facility];  Service: Orthopedics;  Laterality: N/A;  . TONSILLECTOMY  AGE 25  . 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 VAGINAL HYSTERECTOMY  1970's    OB History   No obstetric history on file.      Home Medications  Prior to Admission medications   Medication Sig Start Date End Date Taking? Authorizing Provider  magnesium gluconate (MAGONATE) 500 MG tablet Take 500 mg by mouth in the morning, at noon, and at bedtime.   Yes [provider]  ALPRAZolam (XANAX) 0.25 MG tablet Take 1 tablet (0.25 mg total) by mouth as directed. 1/2 tablet r twice daily or one tab at night for anxiety 02/07/20   Adrian Prows, MD  aspirin EC 81 MG tablet Take 81 mg by mouth daily.    [provider]  betamethasone valerate ointment (VALISONE) 0.1 % Apply 1 application topically 2 (two) times daily. 04/12/20   Hall-Potvin, [place], PA-C  busPIRone (BUSPAR) 7.5 MG tablet Take 7.5 mg by mouth 2  (two) times daily.    [provider]  Calcium Carb-Cholecalciferol (CALCIUM 600 + D PO) Take 1 tablet by mouth daily.     [provider]  clopidogrel (PLAVIX) 75 MG tablet Take 1 tablet (75 mg total) by mouth daily. 06/25/19   Shirley Friar, PA-C  doxycycline (VIBRAMYCIN) 100 MG capsule Take 1 capsule (100 mg total) by mouth 2 (two) times daily for 5 days. 04/12/20 04/17/20  Hall-Potvin, [place], PA-C  ezetimibe (ZETIA) 10 MG tablet Take 1 tablet (10 mg total) by mouth daily. 09/22/19 03/28/20  Miquel Dunn, NP  FLUoxetine (PROZAC) 20 MG tablet Take 20 mg by mouth daily.    [provider]  glucose blood (ONETOUCH VERIO) test strip 1 each 3 (three) times daily.  12/31/17   [provider]  hydrALAZINE (APRESOLINE) 50 MG tablet Take 1 tablet (50 mg total) by mouth 3 (three) times daily. 09/28/19 03/28/20  Miquel Dunn, NP  insulin lispro (HUMALOG) 100 UNIT/ML injection Inject 2-6 Units into the skin See admin instructions. For use with V - GO 20 Insulin Delivery Device: inject 6 units subcutaneously three times daily before meals, inject 2 units at bedtime if eating a bedtime snack    [provider]  Investigational - Study Medication Take 1 tablet by mouth daily. Study name: dapagliflozin/metformin ER 10/1000mg  Additional study details: This is a Drug Study medication from Dr. Nadyne Coombes at [facility]. Patient started taking this medication on 04/22/18 and is unsure of how long she is to take this medication for. Per patient, she is on this medication as part of a Heart and Diabetes drug study. 04/28/18   Patwardhan, Reynold Bowen, MD  isosorbide mononitrate (IMDUR) 60 MG 24 hr tablet Take 60 mg by mouth daily.    [provider]  levothyroxine (SYNTHROID, LEVOTHROID) 50 MCG tablet Take 50 mcg by mouth daily before breakfast.  08/24/12   Clovis Cao, MD  losartan (COZAAR) 50 MG tablet Take 1 tablet (50 mg total) by mouth every  evening. 03/28/20 06/26/20  Tolia, Sunit, DO  Magnesium Oxide 500 MG TABS Take 1 tablet (500 mg total) by mouth 3 (three) times daily. 03/28/20 06/26/20  Tolia, Sunit, DO  metoCLOPramide (REGLAN) 5 MG tablet Take 1 tablet by mouth 3 (three) times daily as needed.  06/11/19 06/10/20  [provider]  naproxen (NAPROSYN) 500 MG tablet Take 500 mg by mouth 2 (two) times daily as needed.     [provider]  nitroGLYCERIN (NITROSTAT) 0.4 MG SL tablet PLACE 1 TABLET UNDER THE TONGUE EVERY 5 MINUTES X 3 DOSES AS NEEDED FOR CHEST PAIN. 03/26/20   Tolia, Sunit, DO  ONETOUCH VERIO test strip 1 each 3 (three) times daily.  06/13/18  [provider]  pantoprazole (PROTONIX) 40 MG tablet Take 1 tablet (40 mg total) by mouth 2 (two) times daily. Patient taking differently: Take 40 mg by mouth daily as needed (acid reflux).  12/31/17   Patwardhan, Reynold Bowen, MD  rosuvastatin (CRESTOR) 40 MG tablet Take 1 tablet (40 mg total) by mouth at bedtime. 03/28/20 06/26/20  Tolia, Sunit, DO  spironolactone (ALDACTONE) 25 MG tablet Take 0.5 tablets (12.5 mg total) by mouth in the morning for 180 doses. 03/28/20 09/24/20  Tolia, Sunit, DO  Study - DAPA TIMI 68 - dapagliflozin (FARXIGA) 10 mg or placebo tablet (PI-Dalton McLean) Take 1 tablet by mouth daily.    [provider]  torsemide (DEMADEX) 20 MG tablet Take 20 mg by mouth daily as needed.    [provider]  vitamin B-12 (CYANOCOBALAMIN) 1000 MCG tablet Take 1,000 mcg by mouth daily.  07/28/18   [provider]  Vitamin D, Ergocalciferol, (DRISDOL) 50000 units CAPS capsule Take 50,000 Units by mouth every Thursday.     [provider]    Family History Family History  Problem Relation Age of Onset  . Cancer Mother        breast  . Heart Problems Mother   . Prostate cancer Father   . Muscular dystrophy Son   . Cancer Maternal Grandmother        breast  . Heart disease Maternal Grandfather   . Breast cancer  Sister     Social History Social History   Tobacco Use  . Smoking status: Never Smoker  . Smokeless tobacco: Never Used  Substance Use Topics  . Alcohol use: No  . Drug use: No     Allergies   Rosiglitazone maleate, Morphine, Cephalexin, Sulfa antibiotics, Tramadol, Elavil [amitriptyline], and Tramadol-acetaminophen   Review of Systems As per HPI   Physical Exam Triage Vital Signs ED Triage Vitals [04/12/20 1551]  Enc Vitals Group     BP (!) 155/84     Pulse Rate 85     Resp 16     Temp 98 F (36.7 C)     Temp src      SpO2 93 %     Weight      Height      Head Circumference      Peak Flow      Pain Score 7     Pain Loc      Pain Edu?      Excl. in [place]?    No data found.  Updated Vital Signs BP (!) 155/84   Pulse 85   Temp 98 F (36.7 C)   Resp 16   SpO2 93%   Visual Acuity Right Eye Distance:   Left Eye Distance:   Bilateral Distance:    Right Eye Near:   Left Eye Near:    Bilateral Near:     Physical Exam Constitutional:      General: She is not in acute distress. HENT:     Head: Normocephalic and atraumatic.  Eyes:     General: No scleral icterus.    Pupils: Pupils are equal, round, and reactive to light.  Cardiovascular:     Rate and Rhythm: Normal rate.  Pulmonary:     Effort: Pulmonary effort is normal.  Musculoskeletal:     Right lower leg: Edema present.     Left lower leg: Edema present.     Comments: 1+ pitting to the knee: Chronic/stable per patient  Skin:  Coloration: Skin is not jaundiced or pale.     Findings: Rash present.     Comments: Mild bullous dermatitis over bilateral distal lower extremities.  Single, large (5 cm) area of superficial erosion second to punctured bulla noted.  Nontender, without warmth or discharge, malodor.  Neurological:     Mental Status: She is alert and oriented to person, place, and time.      UC Treatments / Results  Labs (all labs ordered are listed, but only abnormal results are  displayed) Labs Reviewed - No data to display  EKG   Radiology No results found.  Procedures Procedures (including critical care time)  Medications Ordered in UC Medications - No data to display  Initial Impression / Assessment and Plan / UC Course  I have reviewed the triage vital signs and the nursing notes.  Pertinent labs & imaging results that were available during my care of the patient were reviewed by me and considered in my medical decision making (see chart for details).     Patient afebrile, nontoxic in office today.  H&P consistent with bullous dermatitis: Unsure what was inciting event.  Patient does have history of CHF: We will avoid systemic corticosteroids.  Will start betamethasone topically, and follow-up with PCP Monday.  Patient has pre-existing appoint with cardiology on Tuesday: We will keep this.  Reviewed wound care extensively with patient.  Given comorbidities patient is high risk for infection: We will provide short course of doxycycline.  Return precautions discussed, patient verbalized understanding and is agreeable to plan. Final Clinical Impressions(s) / UC Diagnoses   Final diagnoses:  Bullous dermatitis     Discharge Instructions     Take antibiotic twice daily with food. Apply Valisone ointment twice daily. Change dressing once daily. Follow-up with primary care Monday, keep cardiology appointment Tuesday. Return for worsening pain, weeping, fever.    ED Prescriptions    Medication Sig Dispense Auth. Provider   betamethasone valerate ointment (VALISONE) 0.1 % Apply 1 application topically 2 (two) times daily. 15 g Hall-Potvin, [place], PA-C   doxycycline (VIBRAMYCIN) 100 MG capsule Take 1 capsule (100 mg total) by mouth 2 (two) times daily for 5 days. 10 capsule Hall-Potvin, [place], PA-C     PDMP not reviewed this encounter.   Hall-Potvin, [place], [place] 04/12/20 1620

## 2020-04-12 NOTE — ED Notes (Signed)
Wound care by [place], [place]

## 2020-04-15 ENCOUNTER — Other Ambulatory Visit: Payer: Self-pay | Admitting: Cardiology

## 2020-04-15 DIAGNOSIS — I5042 Chronic combined systolic (congestive) and diastolic (congestive) heart failure: Secondary | ICD-10-CM

## 2020-04-15 NOTE — Progress Notes (Signed)
Spoke to the patient over the phone today.She has been having blisters in the lower extremity and starting Apr 09, 2020 she has been taking torsemide 20 mg p.o. daily.   I have asked her to increase her magnesium tablets to 4 times daily.   We will recheck blood work on April 17, 2020 to reevaluate her kidney function electrolytes and NT proBNP.    Patient states that she will be seeing her primary care provider on June 1 and she usually does blood work.  I have asked her to check BMP, NT proBNP, and magnesium level and have a copy sent to the office.  I will also send blood work to [facility] just in case if she is unable to get the blood work done at PCPs office.

## 2020-04-18 ENCOUNTER — Telehealth: Payer: Self-pay | Admitting: Cardiology

## 2020-04-18 NOTE — Telephone Encounter (Signed)
Spoke to the patient's primary care provider  Mattie Marlin as the patient was recently seen by her and had blood work performed.  Her NT proBNP is trending down and currently it is 2996.  Her serum creatinine level is 1.18 milligrams per deciliter and her magnesium level is 2.3.  Recommendations were to decrease torsemide 20 mg twice daily to torsemide 20 mg p.o. daily  Take magnesium supplement thousand twice daily which is lower than what she currently takes.  She is asked to call the office or her primary care provider if she gains more than 3 pounds in a day or 5 pounds in a week.  She will have close follow-up with her primary care provider.  They are more than welcome to call the office if any questions arise.  Rex Kras, [place], [facility]  Pager: [phone] Office: [phone]

## 2020-04-20 NOTE — Telephone Encounter (Signed)
°  FYI:     Patient called back I have discussed results and will decrease Torsemide as suggested and also will start the MagOx 2,000 daily and understands that if she gains 3-5 lbs or if she has any questions.... To contact our office or PCP.

## 2020-04-20 NOTE — Telephone Encounter (Signed)
Called patient, phone number on file, is out of service.

## 2020-05-03 ENCOUNTER — Encounter (HOSPITAL_BASED_OUTPATIENT_CLINIC_OR_DEPARTMENT_OTHER): Payer: Medicare PPO | Admitting: Internal Medicine

## 2020-05-04 ENCOUNTER — Other Ambulatory Visit: Payer: Self-pay | Admitting: Physician Assistant

## 2020-05-04 DIAGNOSIS — R5381 Other malaise: Secondary | ICD-10-CM

## 2020-05-15 ENCOUNTER — Telehealth: Payer: Self-pay | Admitting: Cardiology

## 2020-05-16 NOTE — Telephone Encounter (Signed)
Called and spoke with patient regarding her pacemaker check results.  

## 2020-05-18 ENCOUNTER — Other Ambulatory Visit: Payer: Self-pay | Admitting: Physician Assistant

## 2020-05-18 DIAGNOSIS — E2839 Other primary ovarian failure: Secondary | ICD-10-CM

## 2020-05-23 ENCOUNTER — Other Ambulatory Visit: Payer: Self-pay | Admitting: Cardiology

## 2020-05-23 DIAGNOSIS — I2510 Atherosclerotic heart disease of native coronary artery without angina pectoris: Secondary | ICD-10-CM

## 2020-05-29 ENCOUNTER — Ambulatory Visit: Payer: Medicare PPO | Admitting: Cardiology

## 2020-05-29 ENCOUNTER — Encounter: Payer: Self-pay | Admitting: Cardiology

## 2020-05-29 ENCOUNTER — Other Ambulatory Visit: Payer: Self-pay

## 2020-05-29 VITALS — BP 143/59 | HR 65 | Resp 16 | Ht 62.0 in | Wt 170.2 lb

## 2020-05-29 DIAGNOSIS — Z955 Presence of coronary angioplasty implant and graft: Secondary | ICD-10-CM

## 2020-05-29 DIAGNOSIS — I447 Left bundle-branch block, unspecified: Secondary | ICD-10-CM

## 2020-05-29 DIAGNOSIS — Z794 Long term (current) use of insulin: Secondary | ICD-10-CM

## 2020-05-29 DIAGNOSIS — I5042 Chronic combined systolic (congestive) and diastolic (congestive) heart failure: Secondary | ICD-10-CM

## 2020-05-29 DIAGNOSIS — I255 Ischemic cardiomyopathy: Secondary | ICD-10-CM

## 2020-05-29 DIAGNOSIS — Z95810 Presence of automatic (implantable) cardiac defibrillator: Secondary | ICD-10-CM

## 2020-05-29 DIAGNOSIS — I10 Essential (primary) hypertension: Secondary | ICD-10-CM

## 2020-05-29 DIAGNOSIS — E1159 Type 2 diabetes mellitus with other circulatory complications: Secondary | ICD-10-CM

## 2020-05-29 DIAGNOSIS — I2510 Atherosclerotic heart disease of native coronary artery without angina pectoris: Secondary | ICD-10-CM

## 2020-05-29 DIAGNOSIS — Z9981 Dependence on supplemental oxygen: Secondary | ICD-10-CM

## 2020-05-29 DIAGNOSIS — E782 Mixed hyperlipidemia: Secondary | ICD-10-CM

## 2020-05-29 MED ORDER — ENTRESTO 49-51 MG PO TABS: 1.0000 | ORAL_TABLET | Freq: Two times a day (BID) | ORAL | 0 refills | Status: DC

## 2020-05-29 NOTE — Progress Notes (Signed)
Primary Physician:  [name], PA-C   [name], female    DOB: 09-Jun-1941, 79 y.o.   MRN: [id number]  Subjective:    Chief Complaint  Patient presents with  . Congestive Heart Failure  . Follow-up    2 month    HPI: Sara Graves  is a 79 y.o. female  with hypertension, uncontrolled type 2 DM, CAD s/p prior LAD and ramus PCI, HFrEF, ischemic cardiomyopathy, s/p BiV ICD implantation 06/2019,  h/o Rt leg cellulitis and metatarsal fracture (managed by Podiatry) most of the office with chief complaint of "heart failure management."   Congestive heart failure: His last office visit patient has not been hospitalized for congestive heart failure.  Her last hospitalization for CHF was back in March 2020.  Patient states that Sara Graves is compliant with her current guideline directed medical therapy.  Medications reconciled.  Since last office visit Sara Graves has been struggling with lower extremity swelling and elevated NT proBNP.  Sara Graves has been working with her primary care provider and myself over the phone to uptitrate her medications.  Most recent lab work reviewed.  Sara Graves has significant improvement in her lower extremity swelling since I last saw her.  No hospitalizations for congestive heart failure since last time Sara Graves was seen in the office.   Echo revealed new decrease in LVEF and underwent coronary angiogram on 01/27/19 and PCI to LAD  and balloon PTCA to IST RI lesion.   Sara Graves underwent echocardiogram on 05/19/19 that continued to reveal depressed LVEF of 20-25%.  Sara Graves was evaluated by Dr. Lovena Le and underwent BiV ICD implantation on 06/21/2019.  Sara Graves has not had any recent episodes of chest pain. No nitroglycerin use.   Past Medical History:  Diagnosis Date  . Acute combined systolic and diastolic heart failure ([place]) 04/25/2018  . Anxiety   . Arthritis    "hands" (12/29/2017)  . Basal cell carcinoma of nose    removed  . Bursitis of left shoulder   . BV ICD Medtronic  [place] MRI CRTD 06/21/2019  . Cat scratch fever    Late 90s  . Chronic combined systolic and diastolic CHF (congestive heart failure) ([place]) 06/08/2019  . Coronary artery disease   . Depression   . Diabetes mellitus, type II, insulin dependent ([place])   . Encounter for assessment of implantable cardioverter-defibrillator (ICD) 09/28/2019  . GERD (gastroesophageal reflux disease)   . H. pylori infection 2008 and 1998   treated  . Hyperlipidemia   . Hypertension   . Hypothyroidism   . Low oxygen saturation   . Multinodular goiter   . Rotator cuff tear, left recurrent   . Urge urinary incontinence   . UTI (lower urinary tract infection) 05/2016    Past Surgical History:  Procedure Laterality Date  . APPENDECTOMY  AGE 1  . BACK SURGERY    . BASAL CELL CARCINOMA EXCISION     "nose"  . BIV ICD INSERTION CRT-D N/A 06/21/2019   Procedure: BIV ICD INSERTION CRT-D;  Surgeon: Evans Lance, MD;  Location: [facility];  Service: Cardiovascular;  Laterality: N/A;  . BLADDER NECK SUSPENSION  1970's  . BUNIONECTOMY WITH HAMMERTOE RECONSTRUCTION Bilateral   . CARPAL TUNNEL RELEASE Right 05/2017  . COLONOSCOPY W/ POLYPECTOMY    . CORONARY ANGIOPLASTY WITH STENT PLACEMENT  12/29/2017  . CORONARY BALLOON ANGIOPLASTY N/A 02/08/2019   Procedure: CORONARY BALLOON ANGIOPLASTY;  Surgeon: Adrian Prows, MD;  Location: [facility];  Service: Cardiovascular;  Laterality: N/A;  . CORONARY STENT INTERVENTION N/A 12/29/2017   Procedure: CORONARY STENT INTERVENTION;  Surgeon: Nigel Mormon, MD;  Location: [facility];  Service: Cardiovascular;  Laterality: N/A;  . CORONARY STENT INTERVENTION N/A 04/26/2018   Procedure: CORONARY STENT INTERVENTION;  Surgeon: Nigel Mormon, MD;  Location: [facility];  Service: Cardiovascular;  Laterality: N/A;  . CORONARY STENT INTERVENTION N/A 02/08/2019   Procedure: CORONARY STENT INTERVENTION;  Surgeon: Adrian Prows, MD;  Location: [facility];  Service: Cardiovascular;  Laterality: N/A;  . ESOPHAGOGASTRODUODENOSCOPY ENDOSCOPY    . FORAMINAL DECOMPRESSION AT L2 TO THE SACRUM  01-05-2008   L2  -  S1  . GANGLION CYST EXCISION Left 01/17/2009   ring finger  . IMPLANTATION PERMANENT SPINAL CORD STIMULATOR  06-15-2008   JUNE 2013--  BATTERY CHANGE  . JOINT REPLACEMENT    . LEFT HEART CATH AND CORONARY ANGIOGRAPHY N/A 04/26/2018   Procedure: LEFT HEART CATH AND CORONARY ANGIOGRAPHY;  Surgeon: Nigel Mormon, MD;  Location: [facility];  Service: Cardiovascular;  Laterality: N/A;  . LEFT HEART CATH AND CORONARY ANGIOGRAPHY N/A 02/08/2019   Procedure: LEFT HEART CATH AND CORONARY ANGIOGRAPHY;  Surgeon: Adrian Prows, MD;  Location: [facility];  Service: Cardiovascular;  Laterality: N/A;  . LIPOMA EXCISION Right    RIGHT ELBOW  . METATARSAL HEAD EXCISION Right 06/16/2018   Procedure: right 5th metatarsal excision, a mini c-arm;  Surgeon: Trula Slade, DPM;  Location: [facility];  Service: Podiatry;  Laterality: Right;  anesthesia can do block  . RIGHT/LEFT HEART CATH AND CORONARY ANGIOGRAPHY N/A 12/29/2017   Procedure: RIGHT/LEFT HEART CATH AND CORONARY ANGIOGRAPHY;  Surgeon: Nigel Mormon, MD;  Location: [facility] [facility];  Service: Cardiovascular;  Laterality: N/A;  . SHOULDER OPEN ROTATOR CUFF REPAIR  09/07/2012   Procedure: ROTATOR CUFF REPAIR SHOULDER OPEN;  Surgeon: Magnus Sinning, MD;  Location: [facility];  Service: Orthopedics;  Laterality: Left;  OPEN ANTERIOR ACROMIONECTOMY AND ROTATOR CUFF REPAIR ON LEFT   . SHOULDER OPEN ROTATOR CUFF REPAIR Left 12/28/2012   Procedure: ROTATOR CUFF REPAIR SHOULDER OPEN;  Surgeon: Magnus Sinning, MD;  Location: [facility];  Service: Orthopedics;  Laterality: Left;  OPEN SHOULDER ROTATOR CUFF REPAIR ON LEFT  WITH ANTERIOR ACROMINECTOMY   . SHOULDER OPEN ROTATOR CUFF REPAIR Right 03/28/2003   RIGHT SHOULDER  DEGENERATIVE AC JOINT  AND RC TEAR  . SPINAL CORD STIMULATOR BATTERY EXCHANGE N/A 07/03/2016   Procedure: SPINAL CORD STIMULATOR BATTERY PLACMENT;  Surgeon: Melina Schools, MD;  Location: [place];  Service: Orthopedics;  Laterality: N/A;  . TONSILLECTOMY  AGE 79  . 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 VAGINAL HYSTERECTOMY  1970's   Social History   Tobacco Use  . Smoking status: Never Smoker  . Smokeless tobacco: Never Used  Vaping Use  . Vaping Use: Never used  Substance Use Topics  . Alcohol use: No  . Drug use: No    Review of Systems  Constitutional: Negative for chills, decreased appetite, malaise/fatigue and weight gain.  Cardiovascular: Positive for dyspnea on exertion (improved). Negative for chest pain, leg swelling, orthopnea and syncope.  Respiratory: Negative for cough.   Endocrine: Negative for cold intolerance.  Hematologic/Lymphatic: Does not bruise/bleed easily.  Musculoskeletal: Negative for joint swelling.  Gastrointestinal: Negative for abdominal pain, anorexia, change in bowel habit, diarrhea and nausea.  Neurological: Negative for headaches and light-headedness.  Psychiatric/Behavioral: Negative for depression and substance abuse.  All other systems reviewed and are negative.     Objective:   Vitals with BMI 05/29/2020 04/12/2020 03/28/2020  Height 5\' 2"  - 5\' 2"   Weight 170 lbs 3 oz - 171 lbs  BMI 09.62 - 83.66  Systolic 294 765 465  Diastolic 59 84 78  Pulse 65 85 74   Physical Exam Vitals reviewed.  Constitutional:      General: Sara Graves is not in acute distress.    Appearance: Sara Graves is well-developed.  HENT:     Head: Atraumatic.  Eyes:     Conjunctiva/sclera: Conjunctivae normal.  Neck:     Thyroid: No thyromegaly.     Vascular: No JVD.  Cardiovascular:     Rate and Rhythm: Normal rate and regular rhythm.     Pulses: Intact distal pulses.          Carotid pulses are 2+ on the right side with bruit and 2+ on the left side.      Femoral pulses are  2+ on the right side and 2+ on the left side.      Popliteal pulses are 0 on the right side and 0 on the left side.       Dorsalis pedis pulses are 0 on the right side and 0 on the left side.       Posterior tibial pulses are 0 on the right side and 0 on the left side.     Heart sounds: Murmur heard.  No gallop.      Comments: Pacemaker/ICD site is clean dry and intact.  Soft holosystolic murmur heard at the apex. Pulmonary:     Effort: Pulmonary effort is normal.     Breath sounds: Normal breath sounds.  Abdominal:     General: Bowel sounds are normal.     Palpations: Abdomen is soft.  Musculoskeletal:        General: Normal range of motion.     Cervical back: Neck supple.  Skin:    General: Skin is warm and dry.  Neurological:     Mental Status: Sara Graves is alert.    Radiology: No results found.  Laboratory examination:   CMP Latest Ref Rng & Units 04/04/2020 02/22/2020 02/07/2020  Glucose 65 - 99 mg/dL 120(H) 183(H) 156(H)  BUN 8 - 27 mg/dL 23 26 20   Creatinine 0.57 - 1.00 mg/dL 0.93 0.90 1.03(H)  Sodium 134 - 144 mmol/L 146(H) 141 143  Potassium 3.5 - 5.2 mmol/L 3.8 4.1 4.2  Chloride 96 - 106 mmol/L 107(H) 102 104  CO2 20 - 29 mmol/L 23 25 25   Calcium 8.7 - 10.3 mg/dL 9.3 9.2 9.3  Total Protein 6.0 - 8.5 g/dL - - -  Total Bilirubin 0.0 - 1.2 mg/dL - - -  Alkaline Phos 39 - 117 IU/L - - -  AST 0 - 40 IU/L - - -  ALT 0 - 32 IU/L - - -   CBC Latest Ref Rng & Units 06/17/2019 01/21/2019 01/20/2019  WBC 3.4 - 10.8 x10E3/uL 6.7 4.0 4.5  Hemoglobin 11.1 - 15.9 g/dL 13.9 11.1(L) 11.1(L)  Hematocrit 34.0 - 46.6 % 43.7 34.3(L) 34.2(L)  Platelets 150 - 450 x10E3/uL 139(L) 112(L) 106(L)   Lipid Panel     Component Value Date/Time   CHOL 171 02/07/2020 1524   TRIG 62 02/07/2020 1524   HDL 55 02/07/2020 1524   CHOLHDL 2.9 12/26/2019 1407   CHOLHDL 3.9  03/25/2015 0325   VLDL 23 03/25/2015 0325   LDLCALC 104 (H) 02/07/2020 1524   LDLDIRECT 90 04/08/2012 1414   HEMOGLOBIN A1C Lab  Results  Component Value Date   HGBA1C 7.8 (H) 01/20/2019   MPG 177.16 01/20/2019   TSH Recent Labs    11/30/19 1437  TSH 3.100   BNP (last 3 results) No results for input(s): BNP in the last 8760 hours.  ProBNP (last 3 results) Recent Labs    02/07/20 1524 02/22/20 1514 04/04/20 1414  PROBNP 10,166* 5,638* 6,821*  CANCELED   PRN Meds:. Medications Discontinued During This Encounter  Medication Reason  . magnesium gluconate (MAGONATE) 093 MG tablet Duplicate  . naproxen (NAPROSYN) 500 MG tablet Patient Preference  . losartan (COZAAR) 50 MG tablet Change in therapy  . torsemide (DEMADEX) 20 MG tablet Change in therapy   Current Meds  Medication Sig  . ALPRAZolam (XANAX) 0.25 MG tablet Take 1 tablet (0.25 mg total) by mouth as directed. 1/2 tablet r twice daily or one tab at night for anxiety  . aspirin EC 81 MG tablet Take 81 mg by mouth daily.  . betamethasone valerate ointment (VALISONE) 0.1 % Apply 1 application topically 2 (two) times daily.  . busPIRone (BUSPAR) 7.5 MG tablet Take 7.5 mg by mouth 2 (two) times daily.  . Calcium Carb-Cholecalciferol (CALCIUM 600 + D PO) Take 1 tablet by mouth daily.   . clopidogrel (PLAVIX) 75 MG tablet Take 1 tablet (75 mg total) by mouth daily.  Marland Kitchen ezetimibe (ZETIA) 10 MG tablet Take 1 tablet (10 mg total) by mouth daily.  Marland Kitchen FLUoxetine (PROZAC) 20 MG tablet Take 20 mg by mouth daily.  Marland Kitchen glucose blood (ONETOUCH VERIO) test strip 1 each 3 (three) times daily.   . hydrALAZINE (APRESOLINE) 50 MG tablet Take 1 tablet (50 mg total) by mouth 3 (three) times daily.  . insulin lispro (HUMALOG) 100 UNIT/ML injection Inject 2-6 Units into the skin See admin instructions. For use with V - GO 20 Insulin Delivery Device: inject 6 units subcutaneously three times daily before meals, inject 2 units at bedtime if eating a bedtime snack  . Investigational - Study Medication Take 1 tablet by mouth daily. Study name: dapagliflozin/metformin ER  10/1000mg  Additional study details: This is a Drug Study medication from Dr. Nadyne Coombes at [facility]. Patient started taking this medication on 04/22/18 and is unsure of how long Sara Graves is to take this medication for. Per patient, Sara Graves is on this medication as part of a Heart and Diabetes drug study.  . isosorbide mononitrate (IMDUR) 60 MG 24 hr tablet Take 60 mg by mouth daily.  Marland Kitchen levothyroxine (SYNTHROID, LEVOTHROID) 50 MCG tablet Take 50 mcg by mouth daily before breakfast.   . Magnesium Oxide 500 MG TABS Take 1 tablet (500 mg total) by mouth 3 (three) times daily.  . metoCLOPramide (REGLAN) 5 MG tablet Take 1 tablet by mouth 3 (three) times daily as needed.   . mupirocin ointment (BACTROBAN) 2 % Apply 1 application topically in the morning, at noon, and at bedtime. Apply to affected area  . nitroGLYCERIN (NITROSTAT) 0.4 MG SL tablet PLACE 1 TABLET UNDER THE TONGUE EVERY 5 MINUTES X 3 DOSES AS NEEDED FOR CHEST PAIN.  Marland Kitchen ONETOUCH VERIO test strip 1 each 3 (three) times daily.   . pantoprazole (PROTONIX) 40 MG tablet Take 1 tablet (40 mg total) by mouth 2 (two) times daily. (Patient taking differently: Take 40 mg by mouth daily as needed (acid reflux). )  .  rosuvastatin (CRESTOR) 40 MG tablet Take 1 tablet (40 mg total) by mouth at bedtime.  Marland Kitchen spironolactone (ALDACTONE) 25 MG tablet Take 0.5 tablets (12.5 mg total) by mouth in the morning for 180 doses.  . Study - DAPA TIMI 68 - dapagliflozin (FARXIGA) 10 mg or placebo tablet (PI-Dalton McLean) Take 1 tablet by mouth daily.  . vitamin B-12 (CYANOCOBALAMIN) 1000 MCG tablet Take 1,000 mcg by mouth daily.   . Vitamin D, Ergocalciferol, (DRISDOL) 50000 units CAPS capsule Take 50,000 Units by mouth every Thursday.   . [DISCONTINUED] losartan (COZAAR) 50 MG tablet Take 1 tablet (50 mg total) by mouth every evening.  . [DISCONTINUED] torsemide (DEMADEX) 20 MG tablet Take 20 mg by mouth daily.    Cardiac Studies:   Echocardiogram 11/30/2019: LVEF 20%,  left ventricular cavity dilated, moderate concentric LVH, severe hypokinetic global wall motion, grade 2 diastolic impairment, elevated left atrial pressure, severely dilated left atrium, mild to moderate AR, moderate to severe MR, moderate TR, moderate pulmonary hypertension RVSP 64 mmHg, elevated right atrial pressure, small pericardial effusion.  Carotid artery duplex 12/02/2018: No hemodynamically significant arterial disease in the internal carotid artery bilaterally. Minimal plaque noted bilateral carotid arteries. Antegrade right vertebral artery flow. Antegrade left vertebral artery flow.  Coronary angiogram 02/08/2019: Distal left main 20% diffuse disease. Ostial LAD stent shows diffuse 20% in-stent restenosis (3.0 x 15 mm resolute Onyx on 12/29/2017). Mid LAD and mid to distal LAD there are tandem lesions 80% and 95%. Ramus intermediate ostial 95 to 99% stenosis. Previously placed RI stent widely patent ( 2.0 x 12 mm resolute on 04/26/2018). 30 to 40% tandem lesions in the RCA. Cutting Balloon angioplasty of the ramus intermediate ostial high-grade stenosis, 99% reduced to 0% and Cutting Balloon angioplasty of the mid LAD followed by stenting with 2.5 x 32 mm Synergy DES for high-grade 90% stenosis reduced to 0% and TIMI-3 to TIMI-3 flow maintained in both lesions.  EKG 02/09/19: Sinus bradycardia at rate of 55 bpm, left bundle branch block.  No further analysis.  No significant change from prior EKG 02/08/2019.  Scheduled Remote ICD check 01/18/2020:   No AHR or VHR episodes, no mode switches.  Normal BVICD function. Health trends (patient activity, heart rate variability, average heart rates) are stable. Trans-thoracic impedance trends and the Optivol Fluid Index do not suggests fluid accumulation. Battery longevity is 9.5 years. RA pacing is 5.6 %, RV pacing is 97.9 %, and LV pacing is 97.9 %.   Assessment:     ICD-10-CM   1. Chronic combined systolic and diastolic CHF  (congestive heart failure) (HCC)  I50.42 sacubitril-valsartan (ENTRESTO) 49-51 MG    Magnesium    Pro b natriuretic peptide (BNP)    Basic metabolic panel  2. Ischemic cardiomyopathy  I25.5 sacubitril-valsartan (ENTRESTO) 49-51 MG    Magnesium    Pro b natriuretic peptide (BNP)    Basic metabolic panel  3. Hypomagnesemia  E83.42   4. Mixed hyperlipidemia  E78.2   5. Biventricular ICD (implantable cardioverter-defibrillator) in place  Z95.810   6. Left bundle branch block  I44.7   7. Coronary artery disease involving native coronary artery of native heart without angina pectoris  I25.10   8. Status post primary angioplasty with coronary stent  Z95.5   9. Essential hypertension  I10   10. On supplemental oxygen therapy  Z99.81   11. Type 2 diabetes mellitus with other circulatory complication, with long-term current use of insulin (HCC)  E11.59    Z79.4  Recommendations:  Chronic combined systolic and diastolic heart failure, stage C, NYHA class II/III:  Ejection Fraction noted on last 2D Echo  Recommend daily weight check, strict I/O's  Fluid restriction to <2L per day, Na restriction < 1.5g per day  Discontinue losartan.  Will initiate Entresto 49/51 mg p.o. twice daily.  Repeat labs in 1 week to evaluate kidney function and electrolytes.  Currently on hydralazine and isosorbide mono nitrate combination.  Continue Aldactone 12.5 mg p.o. daily  Hold torsemide 20 mg p.o. every morning.   Continue Farxiga  Status post Medtronic BiV ICD  Patient currently remains asymptomatic.  Ischemic cardiomyopathy: See above  Biventricular ICD in situ:  Most recent interrogation reviewed.  Continue to monitor  Established coronary artery disease with prior angioplasty and stents:  Continue aspirin and Plavix.  Medications reconciled.  Most recent echocardiogram results reviewed.  Hypomagnesemia: Currently taking magnesium supplements 500 mg p.o. 3 times  daily  Insulin-dependent diabetes mellitus type 2: Currently the primary team. Long-term insulin use: See above  Mixed hyperlipidemia:  LDL level greater than 70 mg/deciliter.  Reeducated on the importance of lifestyle and dietary changes.  Currently on Crestor 40 mg p.o. nightly and Zetia at 10 mg p.o. daily  Current Outpatient Medications:  .  ALPRAZolam (XANAX) 0.25 MG tablet, Take 1 tablet (0.25 mg total) by mouth as directed. 1/2 tablet r twice daily or one tab at night for anxiety, Disp: 30 tablet, Rfl: 0 .  aspirin EC 81 MG tablet, Take 81 mg by mouth daily., Disp: , Rfl:  .  betamethasone valerate ointment (VALISONE) 0.1 %, Apply 1 application topically 2 (two) times daily., Disp: 15 g, Rfl: 1 .  busPIRone (BUSPAR) 7.5 MG tablet, Take 7.5 mg by mouth 2 (two) times daily., Disp: , Rfl:  .  Calcium Carb-Cholecalciferol (CALCIUM 600 + D PO), Take 1 tablet by mouth daily. , Disp: , Rfl:  .  clopidogrel (PLAVIX) 75 MG tablet, Take 1 tablet (75 mg total) by mouth daily., Disp: 90 tablet, Rfl: 1 .  ezetimibe (ZETIA) 10 MG tablet, Take 1 tablet (10 mg total) by mouth daily., Disp: 90 tablet, Rfl: 3 .  FLUoxetine (PROZAC) 20 MG tablet, Take 20 mg by mouth daily., Disp: , Rfl:  .  glucose blood (ONETOUCH VERIO) test strip, 1 each 3 (three) times daily. , Disp: , Rfl:  .  hydrALAZINE (APRESOLINE) 50 MG tablet, Take 1 tablet (50 mg total) by mouth 3 (three) times daily., Disp: 90 tablet, Rfl: 2 .  insulin lispro (HUMALOG) 100 UNIT/ML injection, Inject 2-6 Units into the skin See admin instructions. For use with V - GO 20 Insulin Delivery Device: inject 6 units subcutaneously three times daily before meals, inject 2 units at bedtime if eating a bedtime snack, Disp: , Rfl:  .  Investigational - Study Medication, Take 1 tablet by mouth daily. Study name: dapagliflozin/metformin ER 10/1000mg  Additional study details: This is a Drug Study medication from Dr. Nadyne Coombes at [facility]. Patient  started taking this medication on 04/22/18 and is unsure of how long Sara Graves is to take this medication for. Per patient, Sara Graves is on this medication as part of a Heart and Diabetes drug study., Disp: 1 each, Rfl: PRN .  isosorbide mononitrate (IMDUR) 60 MG 24 hr tablet, Take 60 mg by mouth daily., Disp: , Rfl:  .  levothyroxine (SYNTHROID, LEVOTHROID) 50 MCG tablet, Take 50 mcg by mouth daily before breakfast. , Disp: , Rfl:  .  Magnesium Oxide 500  MG TABS, Take 1 tablet (500 mg total) by mouth 3 (three) times daily., Disp: 270 tablet, Rfl: 0 .  metoCLOPramide (REGLAN) 5 MG tablet, Take 1 tablet by mouth 3 (three) times daily as needed. , Disp: , Rfl:  .  mupirocin ointment (BACTROBAN) 2 %, Apply 1 application topically in the morning, at noon, and at bedtime. Apply to affected area, Disp: , Rfl:  .  nitroGLYCERIN (NITROSTAT) 0.4 MG SL tablet, PLACE 1 TABLET UNDER THE TONGUE EVERY 5 MINUTES X 3 DOSES AS NEEDED FOR CHEST PAIN., Disp: 25 tablet, Rfl: 2 .  ONETOUCH VERIO test strip, 1 each 3 (three) times daily. , Disp: , Rfl: 5 .  pantoprazole (PROTONIX) 40 MG tablet, Take 1 tablet (40 mg total) by mouth 2 (two) times daily. (Patient taking differently: Take 40 mg by mouth daily as needed (acid reflux). ), Disp: 60 tablet, Rfl: 1 .  rosuvastatin (CRESTOR) 40 MG tablet, Take 1 tablet (40 mg total) by mouth at bedtime., Disp: 90 tablet, Rfl: 3 .  spironolactone (ALDACTONE) 25 MG tablet, Take 0.5 tablets (12.5 mg total) by mouth in the morning for 180 doses., Disp: 90 tablet, Rfl: 0 .  Study - DAPA TIMI 68 - dapagliflozin (FARXIGA) 10 mg or placebo tablet (PI-Dalton Aundra Dubin), Take 1 tablet by mouth daily., Disp: , Rfl:  .  vitamin B-12 (CYANOCOBALAMIN) 1000 MCG tablet, Take 1,000 mcg by mouth daily. , Disp: , Rfl:  .  Vitamin D, Ergocalciferol, (DRISDOL) 50000 units CAPS capsule, Take 50,000 Units by mouth every Thursday. , Disp: , Rfl:  .  sacubitril-valsartan (ENTRESTO) 49-51 MG, Take 1 tablet by mouth 2 (two)  times daily., Disp: 60 tablet, Rfl: 0  Orders Placed This Encounter  Procedures  . Magnesium  . Pro b natriuretic peptide (BNP)  . Basic metabolic panel   --Continue cardiac medications as reconciled in final medication list. --Return in about 4 weeks (around 06/26/2020) for heart failure management.. Or sooner if needed. --Continue follow-up with your primary care physician regarding the management of your other chronic comorbid conditions.  Patient's questions and concerns were addressed to her satisfaction. Sara Graves voices understanding of the instructions provided during this encounter.   This note was created using a voice recognition software as a result there may be grammatical errors inadvertently enclosed that do not reflect the nature of this encounter. Every attempt is made to correct such errors.  Rex Kras, [place], [facility]  Pager: [phone] Office: [phone]

## 2020-05-29 NOTE — Patient Instructions (Addendum)
Stop Losartan and Torsemide.  Start Entresto  Check labs in one week to check your kidney and electrolyte.  Check your weight.

## 2020-06-03 ENCOUNTER — Other Ambulatory Visit: Payer: Self-pay | Admitting: Cardiology

## 2020-06-03 DIAGNOSIS — I5042 Chronic combined systolic (congestive) and diastolic (congestive) heart failure: Secondary | ICD-10-CM

## 2020-06-07 ENCOUNTER — Telehealth: Payer: Self-pay

## 2020-06-07 LAB — BASIC METABOLIC PANEL
BUN/Creatinine Ratio: 29 — ABNORMAL HIGH (ref 12–28)
BUN: 25 mg/dL (ref 8–27)
CO2: 24 mmol/L (ref 20–29)
Calcium: 9.6 mg/dL (ref 8.7–10.3)
Chloride: 109 mmol/L — ABNORMAL HIGH (ref 96–106)
Creatinine, Ser: 0.87 mg/dL (ref 0.57–1.00)
GFR calc Af Amer: 73 mL/min/{1.73_m2} (ref >59)
GFR calc non Af Amer: 64 mL/min/{1.73_m2} (ref >59)
Glucose: 180 mg/dL — ABNORMAL HIGH (ref 65–99)
Potassium: 4.3 mmol/L (ref 3.5–5.2)
Sodium: 144 mmol/L (ref 134–144)

## 2020-06-07 LAB — MAGNESIUM: Magnesium: 1.8 mg/dL (ref 1.6–2.3)

## 2020-06-07 LAB — PRO B NATRIURETIC PEPTIDE: NT-Pro BNP: 3815 pg/mL — ABNORMAL HIGH (ref 0–738)

## 2020-06-07 NOTE — Telephone Encounter (Signed)
Discussed results with patient. She has concerns that since starting Entresto when she urinates it is burning. The episodes have become less frequent now only happening about 3 times daily vs everytime. Is this something she should be concerned about.

## 2020-06-07 NOTE — Telephone Encounter (Signed)
Please have her convey this to her PCP, it could be UTI.

## 2020-06-07 NOTE — Telephone Encounter (Signed)
Advised patient to contact her PCP. She verbalized understanding.

## 2020-06-07 NOTE — Telephone Encounter (Signed)
-----   Message from [place], [place] sent at 06/07/2020 12:37 PM EDT ----- Results reviewed.Serum potassium and magnesium levels are within normal limits.Kidney function is relatively at baseline. NT proBNP is improving.Continue current medical therapy.  Continue EntrestoCall if questions arise.

## 2020-06-22 ENCOUNTER — Other Ambulatory Visit: Payer: Self-pay | Admitting: Cardiology

## 2020-06-22 DIAGNOSIS — I255 Ischemic cardiomyopathy: Secondary | ICD-10-CM

## 2020-06-23 ENCOUNTER — Telehealth: Payer: Self-pay | Admitting: Cardiology

## 2020-06-26 ENCOUNTER — Other Ambulatory Visit: Payer: Self-pay

## 2020-06-26 ENCOUNTER — Encounter: Payer: Self-pay | Admitting: Cardiology

## 2020-06-26 ENCOUNTER — Ambulatory Visit: Payer: Medicare PPO | Admitting: Cardiology

## 2020-06-26 VITALS — BP 140/56 | HR 63 | Resp 16 | Ht 62.0 in | Wt 169.7 lb

## 2020-06-26 DIAGNOSIS — I255 Ischemic cardiomyopathy: Secondary | ICD-10-CM

## 2020-06-26 DIAGNOSIS — Z955 Presence of coronary angioplasty implant and graft: Secondary | ICD-10-CM

## 2020-06-26 DIAGNOSIS — I447 Left bundle-branch block, unspecified: Secondary | ICD-10-CM

## 2020-06-26 DIAGNOSIS — I10 Essential (primary) hypertension: Secondary | ICD-10-CM

## 2020-06-26 DIAGNOSIS — I5042 Chronic combined systolic (congestive) and diastolic (congestive) heart failure: Secondary | ICD-10-CM

## 2020-06-26 DIAGNOSIS — E782 Mixed hyperlipidemia: Secondary | ICD-10-CM

## 2020-06-26 DIAGNOSIS — Z794 Long term (current) use of insulin: Secondary | ICD-10-CM

## 2020-06-26 DIAGNOSIS — Z95810 Presence of automatic (implantable) cardiac defibrillator: Secondary | ICD-10-CM

## 2020-06-26 DIAGNOSIS — I2510 Atherosclerotic heart disease of native coronary artery without angina pectoris: Secondary | ICD-10-CM

## 2020-06-26 DIAGNOSIS — Z9981 Dependence on supplemental oxygen: Secondary | ICD-10-CM

## 2020-06-26 NOTE — Progress Notes (Signed)
Primary Physician:  [name], PA-C   [name], female    DOB: 1941/09/01, 79 y.o.   MRN: [id number]   Date: 06/26/2020 Last Office Visit: 05/29/2020  Subjective:    Chief Complaint  Patient presents with  . Heart Failure Managment  . Congestive Heart Failure  . Follow-up    4 week    HPI: Sara Graves  is a 79 y.o. female  with hypertension, uncontrolled type 2 DM, CAD s/p prior LAD and ramus PCI, HFrEF, ischemic cardiomyopathy, s/p BiV ICD implantation 06/2019,  h/o Rt leg cellulitis and metatarsal fracture (managed by Podiatry) most of the office with chief complaint of "heart failure management."   Congestive heart failure: His last office visit patient has not been hospitalized for congestive heart failure.  Her last hospitalization for CHF was back in March 2020.  Patient states that she is compliant with her current medical therapy.  Medications reconciled.  Most recent lab work reviewed.  She has significant improvement in her lower extremity swelling since I last saw her.  She continues to be in a negative fluid balance.  She lost 1 pound his last office visit.  Unfortunately, patient is taking both Entresto and losartan despite instructing her at the last office visit to discontinue losartan.  She has held her Aldactone as advised at last office visit.  Echo revealed new decrease in LVEF and underwent coronary angiogram on 01/27/19 and PCI to LAD  and balloon PTCA to IST RI lesion.  Does not complain of chest discomfort at rest or with effort related activities.  No recent use of sublingual nitroglycerin tablets.  She underwent echocardiogram on 05/19/19 that continued to reveal depressed LVEF of 20-25%.  She was evaluated by Dr. Lovena Le and underwent BiV ICD implantation on 06/21/2019.   Past Medical History:  Diagnosis Date  . Acute combined systolic and diastolic heart failure ([place]) 04/25/2018  . Anxiety   . Arthritis    "hands" (12/29/2017)  .  Basal cell carcinoma of nose    removed  . Bursitis of left shoulder   . BV ICD Medtronic [place] MRI CRTD 06/21/2019  . Cat scratch fever    Late 90s  . Chronic combined systolic and diastolic CHF (congestive heart failure) ([place]) 06/08/2019  . Coronary artery disease   . Depression   . Diabetes mellitus, type II, insulin dependent ([place])   . Encounter for assessment of implantable cardioverter-defibrillator (ICD) 09/28/2019  . GERD (gastroesophageal reflux disease)   . H. pylori infection 2008 and 1998   treated  . Hyperlipidemia   . Hypertension   . Hypothyroidism   . Low oxygen saturation   . Multinodular goiter   . Rotator cuff tear, left recurrent   . Urge urinary incontinence   . UTI (lower urinary tract infection) 05/2016    Past Surgical History:  Procedure Laterality Date  . APPENDECTOMY  AGE 51  . BACK SURGERY    . BASAL CELL CARCINOMA EXCISION     "nose"  . BIV ICD INSERTION CRT-D N/A 06/21/2019   Procedure: BIV ICD INSERTION CRT-D;  Surgeon: Evans Lance, MD;  Location: [facility];  Service: Cardiovascular;  Laterality: N/A;  . BLADDER NECK SUSPENSION  1970's  . BUNIONECTOMY WITH HAMMERTOE RECONSTRUCTION Bilateral   . CARPAL TUNNEL RELEASE Right 05/2017  . COLONOSCOPY W/ POLYPECTOMY    . CORONARY ANGIOPLASTY WITH STENT PLACEMENT  12/29/2017  . CORONARY BALLOON ANGIOPLASTY N/A 02/08/2019  Procedure: CORONARY BALLOON ANGIOPLASTY;  Surgeon: Adrian Prows, MD;  Location: [facility];  Service: Cardiovascular;  Laterality: N/A;  . CORONARY STENT INTERVENTION N/A 12/29/2017   Procedure: CORONARY STENT INTERVENTION;  Surgeon: Nigel Mormon, MD;  Location: [facility];  Service: Cardiovascular;  Laterality: N/A;  . CORONARY STENT INTERVENTION N/A 04/26/2018   Procedure: CORONARY STENT INTERVENTION;  Surgeon: Nigel Mormon, MD;  Location: [facility];  Service: Cardiovascular;  Laterality: N/A;  . CORONARY STENT INTERVENTION N/A  02/08/2019   Procedure: CORONARY STENT INTERVENTION;  Surgeon: Adrian Prows, MD;  Location: [facility];  Service: Cardiovascular;  Laterality: N/A;  . ESOPHAGOGASTRODUODENOSCOPY ENDOSCOPY    . FORAMINAL DECOMPRESSION AT L2 TO THE SACRUM  01-05-2008   L2  -  S1  . GANGLION CYST EXCISION Left 01/17/2009   ring finger  . IMPLANTATION PERMANENT SPINAL CORD STIMULATOR  06-15-2008   JUNE 2013--  BATTERY CHANGE  . JOINT REPLACEMENT    . LEFT HEART CATH AND CORONARY ANGIOGRAPHY N/A 04/26/2018   Procedure: LEFT HEART CATH AND CORONARY ANGIOGRAPHY;  Surgeon: Nigel Mormon, MD;  Location: [facility];  Service: Cardiovascular;  Laterality: N/A;  . LEFT HEART CATH AND CORONARY ANGIOGRAPHY N/A 02/08/2019   Procedure: LEFT HEART CATH AND CORONARY ANGIOGRAPHY;  Surgeon: Adrian Prows, MD;  Location: [facility];  Service: Cardiovascular;  Laterality: N/A;  . LIPOMA EXCISION Right    RIGHT ELBOW  . METATARSAL HEAD EXCISION Right 06/16/2018   Procedure: right 5th metatarsal excision, a mini c-arm;  Surgeon: Trula Slade, DPM;  Location: [facility];  Service: Podiatry;  Laterality: Right;  anesthesia can do block  . RIGHT/LEFT HEART CATH AND CORONARY ANGIOGRAPHY N/A 12/29/2017   Procedure: RIGHT/LEFT HEART CATH AND CORONARY ANGIOGRAPHY;  Surgeon: Nigel Mormon, MD;  Location: [facility];  Service: Cardiovascular;  Laterality: N/A;  . SHOULDER OPEN ROTATOR CUFF REPAIR  09/07/2012   Procedure: ROTATOR CUFF REPAIR SHOULDER OPEN;  Surgeon: Magnus Sinning, MD;  Location: [facility];  Service: Orthopedics;  Laterality: Left;  OPEN ANTERIOR ACROMIONECTOMY AND ROTATOR CUFF REPAIR ON LEFT   . SHOULDER OPEN ROTATOR CUFF REPAIR Left 12/28/2012   Procedure: ROTATOR CUFF REPAIR SHOULDER OPEN;  Surgeon: Magnus Sinning, MD;  Location: [facility];  Service: Orthopedics;  Laterality: Left;  OPEN SHOULDER ROTATOR CUFF REPAIR ON LEFT  WITH ANTERIOR  ACROMINECTOMY   . SHOULDER OPEN ROTATOR CUFF REPAIR Right 03/28/2003   RIGHT SHOULDER  DEGENERATIVE AC JOINT AND RC TEAR  . SPINAL CORD STIMULATOR BATTERY EXCHANGE N/A 07/03/2016   Procedure: SPINAL CORD STIMULATOR BATTERY PLACMENT;  Surgeon: Melina Schools, MD;  Location: [facility];  Service: Orthopedics;  Laterality: N/A;  . TONSILLECTOMY  AGE 57  . 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 VAGINAL HYSTERECTOMY  1970's   Social History   Tobacco Use  . Smoking status: Never Smoker  . Smokeless tobacco: Never Used  Vaping Use  . Vaping Use: Never used  Substance Use Topics  . Alcohol use: No  . Drug use: No    Review of Systems  Constitutional: Positive for weight loss. Negative for chills, decreased appetite, malaise/fatigue and weight gain.  Cardiovascular: Positive for dyspnea on exertion (improved) and leg swelling. Negative for chest pain, orthopnea and syncope.  Respiratory: Negative for cough.   Endocrine: Negative for cold intolerance.  Hematologic/Lymphatic: Does not bruise/bleed easily.  Musculoskeletal: Negative for joint swelling.       Mild redness and erythema in LLE.   Gastrointestinal: Negative for abdominal pain, anorexia, change in bowel habit, diarrhea and nausea.  Neurological: Negative for headaches and light-headedness.  Psychiatric/Behavioral: Negative for depression and substance abuse.  All other systems reviewed and are negative.     Objective:   Vitals with BMI 06/26/2020 05/29/2020 04/12/2020  Height 5\' 2"  5\' 2"  -  Weight 169 lbs 11 oz 170 lbs 3 oz -  BMI 82.99 37.16 -  Systolic 967 893 810  Diastolic 56 59 84  Pulse 63 65 85   Physical Exam Vitals reviewed.  Constitutional:      General: She is not in acute distress.    Appearance: She is well-developed.  HENT:     Head: Atraumatic.  Eyes:     Conjunctiva/sclera: Conjunctivae normal.  Neck:     Thyroid: No thyromegaly.     Vascular: No JVD.  Cardiovascular:     Rate  and Rhythm: Normal rate and regular rhythm.     Pulses: Intact distal pulses.          Carotid pulses are 2+ on the right side with bruit and 2+ on the left side.      Femoral pulses are 2+ on the right side and 2+ on the left side.      Popliteal pulses are 0 on the right side and 0 on the left side.       Dorsalis pedis pulses are 0 on the right side and 0 on the left side.       Posterior tibial pulses are 0 on the right side and 0 on the left side.     Heart sounds: Murmur heard.  No gallop.      Comments: Pacemaker/ICD site is clean dry and intact.  Soft holosystolic murmur heard at the apex. Pulmonary:     Effort: Pulmonary effort is normal.     Breath sounds: Normal breath sounds.  Abdominal:     General: Bowel sounds are normal.     Palpations: Abdomen is soft.  Musculoskeletal:        General: Normal range of motion.     Cervical back: Neck supple.     Left lower leg: Swelling (redness. ) present. 1+ Pitting Edema present.  Skin:    General: Skin is warm and dry.  Neurological:     Mental Status: She is alert.    Radiology: No results found.  Laboratory examination:   CMP Latest Ref Rng & Units 06/26/2020 06/06/2020 04/04/2020  Glucose 65 - 99 mg/dL 209(H) 180(H) 120(H)  BUN 8 - 27 mg/dL 24 25 23   Creatinine 0.57 - 1.00 mg/dL 0.95 0.87 0.93  Sodium 134 - 144 mmol/L 142 144 146(H)  Potassium 3.5 - 5.2 mmol/L 4.4 4.3 3.8  Chloride 96 - 106 mmol/L 105 109(H) 107(H)  CO2 20 - 29 mmol/L 23 24 23   Calcium 8.7 - 10.3 mg/dL 9.6 9.6 9.3  Total Protein 6.0 - 8.5 g/dL - - -  Total Bilirubin 0.0 - 1.2 mg/dL - - -  Alkaline Phos 39 - 117 IU/L - - -  AST 0 - 40 IU/L - - -  ALT 0 - 32 IU/L - - -   CBC Latest Ref Rng & Units 06/17/2019 01/21/2019 01/20/2019  WBC 3.4 - 10.8 x10E3/uL 6.7 4.0 4.5  Hemoglobin 11.1 - 15.9 g/dL 13.9 11.1(L) 11.1(L)  Hematocrit 34.0 - 46.6 % 43.7  34.3(L) 34.2(L)  Platelets 150 - 450 x10E3/uL 139(L) 112(L) 106(L)   Lipid Panel     Component Value  Date/Time   CHOL 171 02/07/2020 1524   TRIG 62 02/07/2020 1524   HDL 55 02/07/2020 1524   CHOLHDL 2.9 12/26/2019 1407   CHOLHDL 3.9 03/25/2015 0325   VLDL 23 03/25/2015 0325   LDLCALC 104 (H) 02/07/2020 1524   LDLDIRECT 90 04/08/2012 1414   HEMOGLOBIN A1C Lab Results  Component Value Date   HGBA1C 7.8 (H) 01/20/2019   MPG 177.16 01/20/2019   TSH Recent Labs    11/30/19 1437  TSH 3.100   BNP (last 3 results) No results for input(s): BNP in the last 8760 hours.  ProBNP (last 3 results) Recent Labs    04/04/20 1414 06/06/20 1515 06/26/20 1157  PROBNP 6,821*  CANCELED 3,815* 2,619*   PRN Meds:. Medications Discontinued During This Encounter  Medication Reason  . losartan (COZAAR) 50 MG tablet Change in therapy  . spironolactone (ALDACTONE) 25 MG tablet Discontinued by provider   Current Meds  Medication Sig  . alendronate (FOSAMAX) 70 MG tablet Take 1 tablet by mouth daily.  Marland Kitchen ALPRAZolam (XANAX) 0.25 MG tablet Take 1 tablet (0.25 mg total) by mouth as directed. 1/2 tablet r twice daily or one tab at night for anxiety  . aspirin EC 81 MG tablet Take 81 mg by mouth daily.  . betamethasone valerate ointment (VALISONE) 0.1 % Apply 1 application topically 2 (two) times daily.  . busPIRone (BUSPAR) 7.5 MG tablet Take 7.5 mg by mouth 2 (two) times daily.  . Calcium Carb-Cholecalciferol (CALCIUM 600 + D PO) Take 1 tablet by mouth daily.   . clopidogrel (PLAVIX) 75 MG tablet Take 1 tablet (75 mg total) by mouth daily.  Marland Kitchen ezetimibe (ZETIA) 10 MG tablet Take 1 tablet (10 mg total) by mouth daily.  Marland Kitchen Fexofenadine HCl (ALLERGY 24-HR PO) Take by mouth.  Marland Kitchen FLUoxetine (PROZAC) 20 MG tablet Take 20 mg by mouth daily.  Marland Kitchen glucose blood (ONETOUCH VERIO) test strip 1 each 3 (three) times daily.   . hydrALAZINE (APRESOLINE) 50 MG tablet Take 1 tablet (50 mg total) by mouth 3 (three) times daily.  . insulin lispro (HUMALOG) 100 UNIT/ML injection Inject 2-6 Units into the skin See admin  instructions. For use with V - GO 20 Insulin Delivery Device: inject 6 units subcutaneously three times daily before meals, inject 2 units at bedtime if eating a bedtime snack  . Investigational - Study Medication Take 1 tablet by mouth daily. Study name: dapagliflozin/metformin ER 10/1000mg  Additional study details: This is a Drug Study medication from Dr. Nadyne Coombes at [facility]. Patient started taking this medication on 04/22/18 and is unsure of how long she is to take this medication for. Per patient, she is on this medication as part of a Heart and Diabetes drug study.  . isosorbide mononitrate (IMDUR) 60 MG 24 hr tablet Take 60 mg by mouth daily.  Marland Kitchen levothyroxine (SYNTHROID, LEVOTHROID) 50 MCG tablet Take 50 mcg by mouth daily before breakfast.   . Magnesium Oxide 500 MG TABS TAKE 1 TABLET (500 MG TOTAL) BY MOUTH IN THE MORNING AND AT BEDTIME. (Patient taking differently: Take 500 mg by mouth in the morning, at noon, and at bedtime. )  . metoCLOPramide (REGLAN) 5 MG tablet Take 5 mg by mouth 4 (four) times daily.  . mupirocin ointment (BACTROBAN) 2 % Apply 1 application topically in the morning, at noon, and at bedtime. Apply to affected  area  . nitroGLYCERIN (NITROSTAT) 0.4 MG SL tablet PLACE 1 TABLET UNDER THE TONGUE EVERY 5 MINUTES X 3 DOSES AS NEEDED FOR CHEST PAIN.  Marland Kitchen ONETOUCH VERIO test strip 1 each 3 (three) times daily.   . pantoprazole (PROTONIX) 40 MG tablet Take 1 tablet (40 mg total) by mouth 2 (two) times daily. (Patient taking differently: Take 40 mg by mouth daily as needed (acid reflux). )  . rosuvastatin (CRESTOR) 40 MG tablet Take 1 tablet (40 mg total) by mouth at bedtime.  . [EXPIRED] sacubitril-valsartan (ENTRESTO) 49-51 MG Take 1 tablet by mouth 2 (two) times daily.  . Study - DAPA TIMI 68 - dapagliflozin (FARXIGA) 10 mg or placebo tablet (PI-Dalton McLean) Take 1 tablet by mouth daily.  . vitamin B-12 (CYANOCOBALAMIN) 1000 MCG tablet Take 1,000 mcg by mouth daily.   .  Vitamin D, Ergocalciferol, (DRISDOL) 50000 units CAPS capsule Take 50,000 Units by mouth every Thursday.   . [DISCONTINUED] losartan (COZAAR) 50 MG tablet Take 50 mg by mouth daily.    Cardiac Studies:   Echocardiogram 11/30/2019: LVEF 20%, left ventricular cavity dilated, moderate concentric LVH, severe hypokinetic global wall motion, grade 2 diastolic impairment, elevated left atrial pressure, severely dilated left atrium, mild to moderate AR, moderate to severe MR, moderate TR, moderate pulmonary hypertension RVSP 64 mmHg, elevated right atrial pressure, small pericardial effusion.  Carotid artery duplex 12/02/2018: No hemodynamically significant arterial disease in the internal carotid artery bilaterally. Minimal plaque noted bilateral carotid arteries. Antegrade right vertebral artery flow. Antegrade left vertebral artery flow.  Coronary angiogram 02/08/2019: Distal left main 20% diffuse disease. Ostial LAD stent shows diffuse 20% in-stent restenosis (3.0 x 15 mm resolute Onyx on 12/29/2017). Mid LAD and mid to distal LAD there are tandem lesions 80% and 95%. Ramus intermediate ostial 95 to 99% stenosis. Previously placed RI stent widely patent ( 2.0 x 12 mm resolute on 04/26/2018). 30 to 40% tandem lesions in the RCA. Cutting Balloon angioplasty of the ramus intermediate ostial high-grade stenosis, 99% reduced to 0% and Cutting Balloon angioplasty of the mid LAD followed by stenting with 2.5 x 32 mm Synergy DES for high-grade 90% stenosis reduced to 0% and TIMI-3 to TIMI-3 flow maintained in both lesions.  EKG 02/09/19: Sinus bradycardia at rate of 55 bpm, left bundle branch block.  No further analysis.  No significant change from prior EKG 02/08/2019.  Scheduled Remote ICD check 06/06/2020:   No AHR or VHR episodes, no mode switches. Ventricular sensing episodes detected, lasting seconds. Health trends (patient activity, heart rate variability, average heart rates) are stable.  Trans-thoracic impedance trends and the Optivol Fluid Index do no present recent significant abnormalities. Battery longevity is 9.1 years. RA pacing is 8.7 %, RV pacing is 97.4 %, and LV pacing is 97.4 %.   Assessment:     ICD-10-CM   1. Chronic combined systolic and diastolic CHF (congestive heart failure) (HCC)  I95.18 Basic metabolic panel    Magnesium    Pro b natriuretic peptide (BNP)  2. Ischemic cardiomyopathy  I25.5   3. Biventricular ICD (implantable cardioverter-defibrillator) in place  Z95.810   4. Mixed hyperlipidemia  E78.2   5. Left bundle branch block  I44.7   6. Coronary artery disease involving native coronary artery of native heart without angina pectoris  I25.10   7. Status post primary angioplasty with coronary stent  Z95.5   8. Essential hypertension  I10   9. On supplemental oxygen therapy  Z99.81   10. Type  2 diabetes mellitus with other circulatory complication, with long-term current use of insulin (HCC)  E11.59    Z79.4    Recommendations:  Chronic combined systolic and diastolic heart failure, stage C, NYHA class II/III:  Ejection Fraction noted on last 2D Echo  Recommend daily weight check, strict I/O's  Fluid restriction to <2L per day, Na restriction < 1.5g per day  Patient forgot to stop losartan after initiating entresto. Discontinue losartan.  Check BMP,Mg, and ProBNP today.   Continue Entresto 49/51 mg p.o. twice daily.  Currently on hydralazine and isosorbide mono nitrate combination.  Holding Aldactone 12.5 mg p.o. daily. If Cr and potassium are within limits will restart Aldactone.   Hold torsemide 20 mg p.o. every morning.   Continue Farxiga  Status post Medtronic BiV ICD; most recent intergorration report review and patient informed.   Patient currently remains asymptomatic.  By the time I completed the note patient already had blood work done.  Repeat blood work noted stable potassium levels and therefore she was recently  instructed to start spironolactone 12.5 mg p.o. every morning.  Repeat blood work in 1 week.  Hold off potassium supplementation.  Ischemic cardiomyopathy: See above  Biventricular ICD in situ:  Most recent interrogation reviewed.  Continue to monitor  Established coronary artery disease with prior angioplasty and stents:  Continue aspirin and Plavix.  Medications reconciled.  Most recent echocardiogram results reviewed.  Hypomagnesemia: Currently taking magnesium supplements 500 mg p.o. 3 times daily  Insulin-dependent diabetes mellitus type 2: Currently the primary team. Long-term insulin use: See above  Mixed hyperlipidemia:  LDL level greater than 70 mg/deciliter.  Reeducated on the importance of lifestyle and dietary changes.  Currently on Crestor 40 mg p.o. nightly and Zetia at 10 mg p.o. daily  Current Outpatient Medications:  .  alendronate (FOSAMAX) 70 MG tablet, Take 1 tablet by mouth daily., Disp: , Rfl:  .  ALPRAZolam (XANAX) 0.25 MG tablet, Take 1 tablet (0.25 mg total) by mouth as directed. 1/2 tablet r twice daily or one tab at night for anxiety, Disp: 30 tablet, Rfl: 0 .  aspirin EC 81 MG tablet, Take 81 mg by mouth daily., Disp: , Rfl:  .  betamethasone valerate ointment (VALISONE) 0.1 %, Apply 1 application topically 2 (two) times daily., Disp: 15 g, Rfl: 1 .  busPIRone (BUSPAR) 7.5 MG tablet, Take 7.5 mg by mouth 2 (two) times daily., Disp: , Rfl:  .  Calcium Carb-Cholecalciferol (CALCIUM 600 + D PO), Take 1 tablet by mouth daily. , Disp: , Rfl:  .  clopidogrel (PLAVIX) 75 MG tablet, Take 1 tablet (75 mg total) by mouth daily., Disp: 90 tablet, Rfl: 1 .  ezetimibe (ZETIA) 10 MG tablet, Take 1 tablet (10 mg total) by mouth daily., Disp: 90 tablet, Rfl: 3 .  Fexofenadine HCl (ALLERGY 24-HR PO), Take by mouth., Disp: , Rfl:  .  FLUoxetine (PROZAC) 20 MG tablet, Take 20 mg by mouth daily., Disp: , Rfl:  .  glucose blood (ONETOUCH VERIO) test strip, 1 each 3  (three) times daily. , Disp: , Rfl:  .  hydrALAZINE (APRESOLINE) 50 MG tablet, Take 1 tablet (50 mg total) by mouth 3 (three) times daily., Disp: 90 tablet, Rfl: 2 .  insulin lispro (HUMALOG) 100 UNIT/ML injection, Inject 2-6 Units into the skin See admin instructions. For use with V - GO 20 Insulin Delivery Device: inject 6 units subcutaneously three times daily before meals, inject 2 units at bedtime if eating a bedtime  snack, Disp: , Rfl:  .  Investigational - Study Medication, Take 1 tablet by mouth daily. Study name: dapagliflozin/metformin ER 10/1000mg  Additional study details: This is a Drug Study medication from Dr. Nadyne Coombes at [facility]. Patient started taking this medication on 04/22/18 and is unsure of how long she is to take this medication for. Per patient, she is on this medication as part of a Heart and Diabetes drug study., Disp: 1 each, Rfl: PRN .  isosorbide mononitrate (IMDUR) 60 MG 24 hr tablet, Take 60 mg by mouth daily., Disp: , Rfl:  .  levothyroxine (SYNTHROID, LEVOTHROID) 50 MCG tablet, Take 50 mcg by mouth daily before breakfast. , Disp: , Rfl:  .  Magnesium Oxide 500 MG TABS, TAKE 1 TABLET (500 MG TOTAL) BY MOUTH IN THE MORNING AND AT BEDTIME. (Patient taking differently: Take 500 mg by mouth in the morning, at noon, and at bedtime. ), Disp: 180 tablet, Rfl: 1 .  metoCLOPramide (REGLAN) 5 MG tablet, Take 5 mg by mouth 4 (four) times daily., Disp: , Rfl:  .  mupirocin ointment (BACTROBAN) 2 %, Apply 1 application topically in the morning, at noon, and at bedtime. Apply to affected area, Disp: , Rfl:  .  nitroGLYCERIN (NITROSTAT) 0.4 MG SL tablet, PLACE 1 TABLET UNDER THE TONGUE EVERY 5 MINUTES X 3 DOSES AS NEEDED FOR CHEST PAIN., Disp: 25 tablet, Rfl: 2 .  ONETOUCH VERIO test strip, 1 each 3 (three) times daily. , Disp: , Rfl: 5 .  pantoprazole (PROTONIX) 40 MG tablet, Take 1 tablet (40 mg total) by mouth 2 (two) times daily. (Patient taking differently: Take 40 mg by  mouth daily as needed (acid reflux). ), Disp: 60 tablet, Rfl: 1 .  rosuvastatin (CRESTOR) 40 MG tablet, Take 1 tablet (40 mg total) by mouth at bedtime., Disp: 90 tablet, Rfl: 3 .  Study - DAPA TIMI 68 - dapagliflozin (FARXIGA) 10 mg or placebo tablet (PI-Dalton Aundra Dubin), Take 1 tablet by mouth daily., Disp: , Rfl:  .  vitamin B-12 (CYANOCOBALAMIN) 1000 MCG tablet, Take 1,000 mcg by mouth daily. , Disp: , Rfl:  .  Vitamin D, Ergocalciferol, (DRISDOL) 50000 units CAPS capsule, Take 50,000 Units by mouth every Thursday. , Disp: , Rfl:   Orders Placed This Encounter  Procedures  . Basic metabolic panel  . Magnesium  . Pro b natriuretic peptide (BNP)   --Continue cardiac medications as reconciled in final medication list. --Return in about 4 weeks (around 07/24/2020) for heart failure management.. Or sooner if needed. --Continue follow-up with your primary care physician regarding the management of your other chronic comorbid conditions.  Patient's questions and concerns were addressed to her satisfaction. She voices understanding of the instructions provided during this encounter.   This note was created using a voice recognition software as a result there may be grammatical errors inadvertently enclosed that do not reflect the nature of this encounter. Every attempt is made to correct such errors.  Rex Kras, [place], [facility]  Pager: [phone] Office: [phone]

## 2020-06-27 LAB — BASIC METABOLIC PANEL WITH GFR
BUN/Creatinine Ratio: 25 (ref 12–28)
BUN: 24 mg/dL (ref 8–27)
CO2: 23 mmol/L (ref 20–29)
Calcium: 9.6 mg/dL (ref 8.7–10.3)
Chloride: 105 mmol/L (ref 96–106)
Creatinine, Ser: 0.95 mg/dL (ref 0.57–1.00)
GFR calc Af Amer: 66 mL/min/1.73
GFR calc non Af Amer: 57 mL/min/1.73 — ABNORMAL LOW
Glucose: 209 mg/dL — ABNORMAL HIGH (ref 65–99)
Potassium: 4.4 mmol/L (ref 3.5–5.2)
Sodium: 142 mmol/L (ref 134–144)

## 2020-06-27 LAB — MAGNESIUM: Magnesium: 1.8 mg/dL (ref 1.6–2.3)

## 2020-06-27 LAB — PRO B NATRIURETIC PEPTIDE: NT-Pro BNP: 2619 pg/mL — ABNORMAL HIGH (ref 0–738)

## 2020-06-29 NOTE — Progress Notes (Signed)
Called and spoke with patient regarding lab results. Patient showed understanding to HOLD Losartan , and restart Spironlactone 12.5 p.o. every morning.

## 2020-06-29 NOTE — Telephone Encounter (Signed)
This number is not accepting calls at this time.

## 2020-06-30 ENCOUNTER — Other Ambulatory Visit: Payer: Self-pay | Admitting: Cardiology

## 2020-06-30 DIAGNOSIS — E2839 Other primary ovarian failure: Secondary | ICD-10-CM

## 2020-06-30 DIAGNOSIS — I5042 Chronic combined systolic (congestive) and diastolic (congestive) heart failure: Secondary | ICD-10-CM

## 2020-07-03 ENCOUNTER — Telehealth: Payer: Self-pay | Admitting: Student

## 2020-07-03 NOTE — Telephone Encounter (Signed)
Spoke with patient and instructed her to go to [facility] and get blood work done on or after 07/07/20. She expressed understanding. Repots she has started the aldactone.

## 2020-07-03 NOTE — Telephone Encounter (Signed)
-----   Message from [place], [place] sent at 06/30/2020  2:35 PM EDT ----- Could you all the patient and have her get labs done on or after 07/07/2020 at [place] after starting aldactone.

## 2020-07-09 NOTE — Telephone Encounter (Signed)
I tried calling this patient. Phone is not accepting calls. Patient does not have any other phone numbers. She has her husband as an emergency contact, however it is the same number as her's. I have added a note on her next appointment so that front can check and review her demographics.   Thank you  -Leda Quail

## 2020-07-11 ENCOUNTER — Other Ambulatory Visit: Payer: Self-pay

## 2020-07-11 ENCOUNTER — Encounter: Payer: Self-pay | Admitting: Podiatry

## 2020-07-11 ENCOUNTER — Ambulatory Visit: Payer: Medicare PPO | Admitting: Podiatry

## 2020-07-11 VITALS — Temp 97.3°F

## 2020-07-11 DIAGNOSIS — D689 Coagulation defect, unspecified: Secondary | ICD-10-CM | POA: Diagnosis not present

## 2020-07-11 DIAGNOSIS — M79675 Pain in left toe(s): Secondary | ICD-10-CM

## 2020-07-11 DIAGNOSIS — L84 Corns and callosities: Secondary | ICD-10-CM | POA: Diagnosis not present

## 2020-07-11 DIAGNOSIS — B351 Tinea unguium: Secondary | ICD-10-CM

## 2020-07-11 DIAGNOSIS — M79674 Pain in right toe(s): Secondary | ICD-10-CM

## 2020-07-11 NOTE — Progress Notes (Signed)
Subjective:   [name], female   DOB: 79 y.o.   MRN: [id number]   HPI Patient presents with chronic nail disease 1-5 both feet and lesions left that are painful and she cannot wear shoe gear with comfortably   ROS      Objective:  Physical Exam  Neurovascular status intact negative Bevelyn Buckles' sign noted with patient found to have thick yellow brittle nailbeds 1-5 both feet and lesion formation x2 painful     Assessment:  Chronic lesion formation with at risk factors diabetes and on blood thinner along with mycotic nail infection with pain 1-5 both feet     Plan:  Debrided nailbeds 1-5 both feet with no iatrogenic bleeding debrided lesions no iatrogenic bleeding reappoint routine care

## 2020-07-11 NOTE — Patient Instructions (Signed)
Diabetes Mellitus and Foot Care Foot care is an important part of your health, especially when you have diabetes. Diabetes may cause you to have problems because of poor blood flow (circulation) to your feet and legs, which can cause your skin to:  Become thinner and drier.  Break more easily.  Heal more slowly.  Peel and crack. You may also have nerve damage (neuropathy) in your legs and feet, causing decreased feeling in them. This means that you may not notice minor injuries to your feet that could lead to more serious problems. Noticing and addressing any potential problems early is the best way to prevent future foot problems. How to care for your feet Foot hygiene  Wash your feet daily with warm water and mild soap. Do not use hot water. Then, pat your feet and the areas between your toes until they are completely dry. Do not soak your feet as this can dry your skin.  Trim your toenails straight across. Do not dig under them or around the cuticle. File the edges of your nails with an emery board or nail file.  Apply a moisturizing lotion or petroleum jelly to the skin on your feet and to dry, brittle toenails. Use lotion that does not contain alcohol and is unscented. Do not apply lotion between your toes. Shoes and socks  Wear clean socks or stockings every day. Make sure they are not too tight. Do not wear knee-high stockings since they may decrease blood flow to your legs.  Wear shoes that fit properly and have enough cushioning. Always look in your shoes before you put them on to be sure there are no objects inside.  To break in new shoes, wear them for just a few hours a day. This prevents injuries on your feet. Wounds, scrapes, corns, and calluses  Check your feet daily for blisters, cuts, bruises, sores, and redness. If you cannot see the bottom of your feet, use a mirror or ask someone for help.  Do not cut corns or calluses or try to remove them with medicine.  If you  find a minor scrape, cut, or break in the skin on your feet, keep it and the skin around it clean and dry. You may clean these areas with mild soap and water. Do not clean the area with peroxide, alcohol, or iodine.  If you have a wound, scrape, corn, or callus on your foot, look at it several times a day to make sure it is healing and not infected. Check for: ? Redness, swelling, or pain. ? Fluid or blood. ? Warmth. ? Pus or a bad smell. General instructions  Do not cross your legs. This may decrease blood flow to your feet.  Do not use heating pads or hot water bottles on your feet. They may burn your skin. If you have lost feeling in your feet or legs, you may not know this is happening until it is too late.  Protect your feet from hot and cold by wearing shoes, such as at the beach or on hot pavement.  Schedule a complete foot exam at least once a year (annually) or more often if you have foot problems. If you have foot problems, report any cuts, sores, or bruises to your health care provider immediately. Contact a health care provider if:  You have a medical condition that increases your risk of infection and you have any cuts, sores, or bruises on your feet.  You have an injury that is not   healing.  You have redness on your legs or feet.  You feel burning or tingling in your legs or feet.  You have pain or cramps in your legs and feet.  Your legs or feet are numb.  Your feet always feel cold.  You have pain around a toenail. Get help right away if:  You have a wound, scrape, corn, or callus on your foot and: ? You have pain, swelling, or redness that gets worse. ? You have fluid or blood coming from the wound, scrape, corn, or callus. ? Your wound, scrape, corn, or callus feels warm to the touch. ? You have pus or a bad smell coming from the wound, scrape, corn, or callus. ? You have a fever. ? You have a red line going up your leg. Summary  Check your feet every day  for cuts, sores, red spots, swelling, and blisters.  Moisturize feet and legs daily.  Wear shoes that fit properly and have enough cushioning.  If you have foot problems, report any cuts, sores, or bruises to your health care provider immediately.  Schedule a complete foot exam at least once a year (annually) or more often if you have foot problems. This information is not intended to replace advice given to you by your health care provider. Make sure you discuss any questions you have with your health care provider. Document Revised: 07/27/2019 Document Reviewed: 12/05/2016 [employer] Patient Education  2020 [employer]  

## 2020-07-15 NOTE — Telephone Encounter (Signed)
Scheduled Remote ICD check 06/06/2020:   No AHR or VHR episodes, no mode switches. Battery longevity is 8.8 years. RA pacing is 10.3 %, RV pacing is 98.2 %, and LV pacing is 98.2 %. Battery longevity is 8.8 years. RA pacing is 10.3 %, RV pacing is 98.2 %, and LV pacing is 98.2 %.

## 2020-07-19 LAB — BASIC METABOLIC PANEL
BUN/Creatinine Ratio: 27 (ref 12–28)
BUN: 27 mg/dL (ref 8–27)
CO2: 22 mmol/L (ref 20–29)
Calcium: 9.1 mg/dL (ref 8.7–10.3)
Chloride: 106 mmol/L (ref 96–106)
Creatinine, Ser: 0.99 mg/dL (ref 0.57–1.00)
GFR calc Af Amer: 63 mL/min/{1.73_m2} (ref >59)
GFR calc non Af Amer: 54 mL/min/{1.73_m2} — ABNORMAL LOW (ref >59)
Glucose: 261 mg/dL — ABNORMAL HIGH (ref 65–99)
Potassium: 4.1 mmol/L (ref 3.5–5.2)
Sodium: 142 mmol/L (ref 134–144)

## 2020-07-19 LAB — MAGNESIUM: Magnesium: 1.9 mg/dL (ref 1.6–2.3)

## 2020-07-20 NOTE — Telephone Encounter (Signed)
Called and spoke with patient regarding her remote ICD results.

## 2020-07-24 NOTE — Progress Notes (Signed)
Patient is aware and wanted you to know she is seeing her PCP and will be here on the 10th for her scheduled appt with you.

## 2020-07-27 ENCOUNTER — Ambulatory Visit: Payer: Medicare PPO | Admitting: Cardiology

## 2020-07-28 ENCOUNTER — Other Ambulatory Visit: Payer: Self-pay | Admitting: Cardiology

## 2020-07-28 DIAGNOSIS — I255 Ischemic cardiomyopathy: Secondary | ICD-10-CM

## 2020-07-28 DIAGNOSIS — I5042 Chronic combined systolic (congestive) and diastolic (congestive) heart failure: Secondary | ICD-10-CM

## 2020-08-07 ENCOUNTER — Telehealth: Payer: Self-pay | Admitting: Cardiology

## 2020-08-08 NOTE — Telephone Encounter (Signed)
Called and spoke with patient regarding her ICD check results.

## 2020-08-14 ENCOUNTER — Ambulatory Visit: Payer: Medicare PPO | Admitting: Cardiology

## 2020-08-14 ENCOUNTER — Encounter: Payer: Self-pay | Admitting: Cardiology

## 2020-08-14 ENCOUNTER — Other Ambulatory Visit: Payer: Self-pay

## 2020-08-14 VITALS — BP 131/52 | HR 76 | Ht 62.0 in | Wt 182.0 lb

## 2020-08-14 DIAGNOSIS — Z95810 Presence of automatic (implantable) cardiac defibrillator: Secondary | ICD-10-CM

## 2020-08-14 DIAGNOSIS — E1159 Type 2 diabetes mellitus with other circulatory complications: Secondary | ICD-10-CM

## 2020-08-14 DIAGNOSIS — I255 Ischemic cardiomyopathy: Secondary | ICD-10-CM

## 2020-08-14 DIAGNOSIS — I2510 Atherosclerotic heart disease of native coronary artery without angina pectoris: Secondary | ICD-10-CM

## 2020-08-14 DIAGNOSIS — Z9981 Dependence on supplemental oxygen: Secondary | ICD-10-CM

## 2020-08-14 DIAGNOSIS — E782 Mixed hyperlipidemia: Secondary | ICD-10-CM

## 2020-08-14 DIAGNOSIS — I10 Essential (primary) hypertension: Secondary | ICD-10-CM

## 2020-08-14 DIAGNOSIS — I447 Left bundle-branch block, unspecified: Secondary | ICD-10-CM

## 2020-08-14 DIAGNOSIS — I5041 Acute combined systolic (congestive) and diastolic (congestive) heart failure: Secondary | ICD-10-CM

## 2020-08-14 DIAGNOSIS — Z955 Presence of coronary angioplasty implant and graft: Secondary | ICD-10-CM

## 2020-08-14 NOTE — Progress Notes (Signed)
Primary Physician:  [name], PA-C   [name], female    DOB: 03-06-1941, 79 y.o.   MRN: [id number]   Date: 08/14/20 Last Office Visit: 06/26/2020  Subjective:    Chief Complaint  Patient presents with  . Congestive Heart Failure  . Follow-up    HPI: Sara Graves  is a 79 y.o. female  with hypertension, insulin-dependent diabetes mellitus type 2, CAD s/p prior LAD and ramus PCI, HFrEF, ischemic cardiomyopathy, s/p BiV ICD implantation 06/2019,  h/o Rt leg cellulitis and metatarsal fracture (managed by Podiatry) with chief complaint of "heart failure management."   Congestive heart failure: His last office visit patient has not been hospitalized for congestive heart failure.  Her last hospitalization for CHF was back in March 2020.  Patient states that she is compliant with her current medical therapy.  Medications reconciled.  Since last office visit patient has been started on Aldactone.  She is supposed to be on 12.5 mg p.o. daily but she has been taking it 25 mg p.o. daily.  Unfortunately, patient has gained approximately 12 pounds since last office visit and feels tired, fatigued, decreased energy.  She denies orthopnea, paroxysmal nocturnal dyspnea.  She does have lower extremity swelling.  Her recent coronary angiogram was on 01/27/19 and PCI to LAD  and balloon PTCA to IST RI lesion.  Does not complain of chest discomfort at rest or with effort related activities.  No recent use of sublingual nitroglycerin tablets.  She underwent echocardiogram on 05/19/19 that continued to reveal depressed LVEF of 20-25%.  She was evaluated by Dr. Lovena Le and underwent BiV ICD implantation on 06/21/2019.  Past Medical History:  Diagnosis Date  . Acute combined systolic and diastolic heart failure ([place]) 04/25/2018  . Anxiety   . Arthritis    "hands" (12/29/2017)  . Basal cell carcinoma of nose    removed  . Bursitis of left shoulder   . BV ICD [employer] [place] MRI  CRTD 06/21/2019  . Cat scratch fever    Late 90s  . Chronic combined systolic and diastolic CHF (congestive heart failure) ([place]) 06/08/2019  . Coronary artery disease   . Depression   . Diabetes mellitus, type II, insulin dependent ([place])   . Encounter for assessment of implantable cardioverter-defibrillator (ICD) 09/28/2019  . GERD (gastroesophageal reflux disease)   . H. pylori infection 2008 and 1998   treated  . Hyperlipidemia   . Hypertension   . Hypothyroidism   . Low oxygen saturation   . Multinodular goiter   . Rotator cuff tear, left recurrent   . Urge urinary incontinence   . UTI (lower urinary tract infection) 05/2016    Past Surgical History:  Procedure Laterality Date  . APPENDECTOMY  AGE 78  . BACK SURGERY    . BASAL CELL CARCINOMA EXCISION     "nose"  . BIV ICD INSERTION CRT-D N/A 06/21/2019   Procedure: BIV ICD INSERTION CRT-D;  Surgeon: Evans Lance, MD;  Location: [facility];  Service: Cardiovascular;  Laterality: N/A;  . BLADDER NECK SUSPENSION  1970's  . BUNIONECTOMY WITH HAMMERTOE RECONSTRUCTION Bilateral   . CARPAL TUNNEL RELEASE Right 05/2017  . COLONOSCOPY W/ POLYPECTOMY    . CORONARY ANGIOPLASTY WITH STENT PLACEMENT  12/29/2017  . CORONARY BALLOON ANGIOPLASTY N/A 02/08/2019   Procedure: CORONARY BALLOON ANGIOPLASTY;  Surgeon: Adrian Prows, MD;  Location: [facility];  Service: Cardiovascular;  Laterality: N/A;  . CORONARY STENT INTERVENTION N/A  12/29/2017   Procedure: CORONARY STENT INTERVENTION;  Surgeon: Nigel Mormon, MD;  Location: [facility];  Service: Cardiovascular;  Laterality: N/A;  . CORONARY STENT INTERVENTION N/A 04/26/2018   Procedure: CORONARY STENT INTERVENTION;  Surgeon: Nigel Mormon, MD;  Location: [facility];  Service: Cardiovascular;  Laterality: N/A;  . CORONARY STENT INTERVENTION N/A 02/08/2019   Procedure: CORONARY STENT INTERVENTION;  Surgeon: Adrian Prows, MD;  Location: [facility];   Service: Cardiovascular;  Laterality: N/A;  . ESOPHAGOGASTRODUODENOSCOPY ENDOSCOPY    . FORAMINAL DECOMPRESSION AT L2 TO THE SACRUM  01-05-2008   L2  -  S1  . GANGLION CYST EXCISION Left 01/17/2009   ring finger  . IMPLANTATION PERMANENT SPINAL CORD STIMULATOR  06-15-2008   JUNE 2013--  BATTERY CHANGE  . JOINT REPLACEMENT    . LEFT HEART CATH AND CORONARY ANGIOGRAPHY N/A 04/26/2018   Procedure: LEFT HEART CATH AND CORONARY ANGIOGRAPHY;  Surgeon: Nigel Mormon, MD;  Location: [facility];  Service: Cardiovascular;  Laterality: N/A;  . LEFT HEART CATH AND CORONARY ANGIOGRAPHY N/A 02/08/2019   Procedure: LEFT HEART CATH AND CORONARY ANGIOGRAPHY;  Surgeon: Adrian Prows, MD;  Location: [facility];  Service: Cardiovascular;  Laterality: N/A;  . LIPOMA EXCISION Right    RIGHT ELBOW  . METATARSAL HEAD EXCISION Right 06/16/2018   Procedure: right 5th metatarsal excision, a mini c-arm;  Surgeon: Trula Slade, DPM;  Location: [facility];  Service: Podiatry;  Laterality: Right;  anesthesia can do block  . RIGHT/LEFT HEART CATH AND CORONARY ANGIOGRAPHY N/A 12/29/2017   Procedure: RIGHT/LEFT HEART CATH AND CORONARY ANGIOGRAPHY;  Surgeon: Nigel Mormon, MD;  Location: [facility];  Service: Cardiovascular;  Laterality: N/A;  . SHOULDER OPEN ROTATOR CUFF REPAIR  09/07/2012   Procedure: ROTATOR CUFF REPAIR SHOULDER OPEN;  Surgeon: Magnus Sinning, MD;  Location: [facility];  Service: Orthopedics;  Laterality: Left;  OPEN ANTERIOR ACROMIONECTOMY AND ROTATOR CUFF REPAIR ON LEFT   . SHOULDER OPEN ROTATOR CUFF REPAIR Left 12/28/2012   Procedure: ROTATOR CUFF REPAIR SHOULDER OPEN;  Surgeon: Magnus Sinning, MD;  Location: [facility];  Service: Orthopedics;  Laterality: Left;  OPEN SHOULDER ROTATOR CUFF REPAIR ON LEFT  WITH ANTERIOR ACROMINECTOMY   . SHOULDER OPEN ROTATOR CUFF REPAIR Right 03/28/2003   RIGHT SHOULDER  DEGENERATIVE AC JOINT AND RC  TEAR  . SPINAL CORD STIMULATOR BATTERY EXCHANGE N/A 07/03/2016   Procedure: SPINAL CORD STIMULATOR BATTERY PLACMENT;  Surgeon: Melina Schools, MD;  Location: [facility];  Service: Orthopedics;  Laterality: N/A;  . TONSILLECTOMY  AGE 57  . 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 VAGINAL HYSTERECTOMY  1970's   Social History   Tobacco Use  . Smoking status: Never Smoker  . Smokeless tobacco: Never Used  Vaping Use  . Vaping Use: Never used  Substance Use Topics  . Alcohol use: No  . Drug use: No    Review of Systems  Constitutional: Positive for malaise/fatigue and weight gain. Negative for chills, decreased appetite and weight loss.  Cardiovascular: Positive for dyspnea on exertion (stable) and leg swelling. Negative for chest pain, orthopnea and syncope.  Respiratory: Negative for cough.   Endocrine: Negative for cold intolerance.  Hematologic/Lymphatic: Does not bruise/bleed easily.  Musculoskeletal: Negative for joint swelling.       Mild redness and erythema in LLE.   Gastrointestinal: Negative for abdominal pain, anorexia,  change in bowel habit, diarrhea and nausea.  Neurological: Negative for headaches and light-headedness.  Psychiatric/Behavioral: Negative for depression and substance abuse.  All other systems reviewed and are negative.     Objective:   Vitals with BMI 08/14/2020 08/14/2020 06/26/2020  Height - 5\' 2"  5\' 2"   Weight - 182 lbs 169 lbs 11 oz  BMI - 16.10 96.04  Systolic 540 981 191  Diastolic 52 39 56  Pulse 76 75 63   Physical Exam Vitals reviewed.  Constitutional:      General: She is not in acute distress.    Appearance: She is well-developed.  HENT:     Head: Atraumatic.  Eyes:     Conjunctiva/sclera: Conjunctivae normal.  Neck:     Thyroid: No thyromegaly.     Vascular: No JVD.  Cardiovascular:     Rate and Rhythm: Normal rate and regular rhythm.     Pulses: Intact distal pulses.          Carotid pulses are 2+ on the right  side with bruit and 2+ on the left side.      Femoral pulses are 2+ on the right side and 2+ on the left side.      Popliteal pulses are 0 on the right side and 0 on the left side.       Dorsalis pedis pulses are 0 on the right side and 0 on the left side.       Posterior tibial pulses are 0 on the right side and 0 on the left side.     Heart sounds: Murmur heard.  No gallop.      Comments: Pacemaker/ICD site is clean dry and intact.  Soft holosystolic murmur heard at the apex. Pulmonary:     Effort: Pulmonary effort is normal.     Breath sounds: Normal breath sounds.  Abdominal:     General: Bowel sounds are normal.     Palpations: Abdomen is soft.  Musculoskeletal:        General: Normal range of motion.     Cervical back: Neck supple.     Left lower leg: Swelling (redness. ) present. 1+ Pitting Edema present.  Skin:    General: Skin is warm and dry.  Neurological:     Mental Status: She is alert.    Radiology: No results found.  Laboratory examination:   CMP Latest Ref Rng & Units 07/18/2020 06/26/2020 06/06/2020  Glucose 65 - 99 mg/dL 261(H) 209(H) 180(H)  BUN 8 - 27 mg/dL 27 24 25   Creatinine 0.57 - 1.00 mg/dL 0.99 0.95 0.87  Sodium 134 - 144 mmol/L 142 142 144  Potassium 3.5 - 5.2 mmol/L 4.1 4.4 4.3  Chloride 96 - 106 mmol/L 106 105 109(H)  CO2 20 - 29 mmol/L 22 23 24   Calcium 8.7 - 10.3 mg/dL 9.1 9.6 9.6  Total Protein 6.0 - 8.5 g/dL - - -  Total Bilirubin 0.0 - 1.2 mg/dL - - -  Alkaline Phos 39 - 117 IU/L - - -  AST 0 - 40 IU/L - - -  ALT 0 - 32 IU/L - - -   CBC Latest Ref Rng & Units 06/17/2019 01/21/2019 01/20/2019  WBC 3.4 - 10.8 x10E3/uL 6.7 4.0 4.5  Hemoglobin 11.1 - 15.9 g/dL 13.9 11.1(L) 11.1(L)  Hematocrit 34.0 - 46.6 % 43.7 34.3(L) 34.2(L)  Platelets 150 - 450 x10E3/uL 139(L) 112(L) 106(L)   Lipid Panel     Component Value Date/Time   CHOL 171 02/07/2020  1524   TRIG 62 02/07/2020 1524   HDL 55 02/07/2020 1524   CHOLHDL 2.9 12/26/2019 1407   CHOLHDL 3.9  03/25/2015 0325   VLDL 23 03/25/2015 0325   LDLCALC 104 (H) 02/07/2020 1524   LDLDIRECT 90 04/08/2012 1414   HEMOGLOBIN A1C Lab Results  Component Value Date   HGBA1C 7.8 (H) 01/20/2019   MPG 177.16 01/20/2019   TSH Recent Labs    11/30/19 1437  TSH 3.100   BNP (last 3 results) No results for input(s): BNP in the last 8760 hours.  ProBNP (last 3 results) Recent Labs    04/04/20 1414 06/06/20 1515 06/26/20 1157  PROBNP 6,821*  CANCELED 3,815* 2,619*   PRN Meds:. Medications Discontinued During This Encounter  Medication Reason  . busPIRone (BUSPAR) 7.5 MG tablet Completed Course   Current Meds  Medication Sig  . alendronate (FOSAMAX) 70 MG tablet Take 1 tablet by mouth daily.  Marland Kitchen ALPRAZolam (XANAX) 0.25 MG tablet Take 1 tablet (0.25 mg total) by mouth as directed. 1/2 tablet r twice daily or one tab at night for anxiety  . aspirin EC 81 MG tablet Take 81 mg by mouth daily.  . betamethasone valerate ointment (VALISONE) 0.1 % Apply 1 application topically 2 (two) times daily.  . Calcium Carb-Cholecalciferol (CALCIUM 600 + D PO) Take 1 tablet by mouth daily.   . clopidogrel (PLAVIX) 75 MG tablet Take 1 tablet (75 mg total) by mouth daily.  . diphenhydrAMINE (BENADRYL) 25 mg capsule Take 25 mg by mouth every 6 (six) hours as needed.  Marland Kitchen ENTRESTO 49-51 MG TAKE 1 TABLET BY MOUTH 2 (TWO) TIMES DAILY.  Marland Kitchen ezetimibe (ZETIA) 10 MG tablet Take 1 tablet (10 mg total) by mouth daily.  Marland Kitchen FLUoxetine (PROZAC) 20 MG tablet Take 20 mg by mouth daily.  . hydrALAZINE (APRESOLINE) 50 MG tablet Take 1 tablet (50 mg total) by mouth 3 (three) times daily.  . insulin lispro (HUMALOG) 100 UNIT/ML injection Inject 2-6 Units into the skin See admin instructions. For use with V - GO 20 Insulin Delivery Device: inject 6 units subcutaneously three times daily before meals, inject 2 units at bedtime if eating a bedtime snack  . Investigational - Study Medication Take 1 tablet by mouth daily. Study name:  dapagliflozin/metformin ER 10/1000mg  Additional study details: This is a Drug Study medication from Dr. Nadyne Coombes at [facility]. Patient started taking this medication on 04/22/18 and is unsure of how long she is to take this medication for. Per patient, she is on this medication as part of a Heart and Diabetes drug study.  . isosorbide mononitrate (IMDUR) 60 MG 24 hr tablet Take 60 mg by mouth daily.  Marland Kitchen levothyroxine (SYNTHROID, LEVOTHROID) 50 MCG tablet Take 50 mcg by mouth daily before breakfast.   . loperamide (IMODIUM) 2 MG capsule Take by mouth as needed for diarrhea or loose stools.  . Magnesium Oxide 500 MG TABS TAKE 1 TABLET (500 MG TOTAL) BY MOUTH IN THE MORNING AND AT BEDTIME. (Patient taking differently: Take 500 mg by mouth in the morning, at noon, and at bedtime. )  . metoCLOPramide (REGLAN) 5 MG tablet Take 5 mg by mouth 4 (four) times daily.  . mupirocin ointment (BACTROBAN) 2 % Apply 1 application topically in the morning, at noon, and at bedtime. Apply to affected area  . nitroGLYCERIN (NITROSTAT) 0.4 MG SL tablet PLACE 1 TABLET UNDER THE TONGUE EVERY 5 MINUTES X 3 DOSES AS NEEDED FOR CHEST PAIN.  Marland Kitchen ondansetron (ZOFRAN)  4 MG tablet Take 4 mg by mouth every 8 (eight) hours as needed for nausea or vomiting.  Glory Rosebush VERIO test strip 1 each 3 (three) times daily.   . pantoprazole (PROTONIX) 40 MG tablet Take 1 tablet (40 mg total) by mouth 2 (two) times daily. (Patient taking differently: Take 40 mg by mouth daily as needed (acid reflux). )  . rosuvastatin (CRESTOR) 40 MG tablet Take 1 tablet (40 mg total) by mouth at bedtime.  Marland Kitchen spironolactone (ALDACTONE) 25 MG tablet Take 25 mg by mouth daily.  . Study - DAPA TIMI 68 - dapagliflozin (FARXIGA) 10 mg or placebo tablet (PI-Dalton McLean) Take 1 tablet by mouth daily.  Marland Kitchen UNABLE TO FIND Oxygen  Pt takes 2 liters a day - on and off during the day as needed  . vitamin B-12 (CYANOCOBALAMIN) 1000 MCG tablet Take 1,000 mcg by mouth  daily.   . Vitamin D, Ergocalciferol, (DRISDOL) 50000 units CAPS capsule Take 50,000 Units by mouth every Thursday.     Cardiac Studies:   Echocardiogram 11/30/2019: LVEF 20%, left ventricular cavity dilated, moderate concentric LVH, severe hypokinetic global wall motion, grade 2 diastolic impairment, elevated left atrial pressure, severely dilated left atrium, mild to moderate AR, moderate to severe MR, moderate TR, moderate pulmonary hypertension RVSP 64 mmHg, elevated right atrial pressure, small pericardial effusion.  Carotid artery duplex 12/02/2018: No hemodynamically significant arterial disease in the internal carotid artery bilaterally. Minimal plaque noted bilateral carotid arteries. Antegrade right vertebral artery flow. Antegrade left vertebral artery flow.  Coronary angiogram 02/08/2019: Distal left main 20% diffuse disease. Ostial LAD stent shows diffuse 20% in-stent restenosis (3.0 x 15 mm resolute Onyx on 12/29/2017). Mid LAD and mid to distal LAD there are tandem lesions 80% and 95%. Ramus intermediate ostial 95 to 99% stenosis. Previously placed RI stent widely patent ( 2.0 x 12 mm resolute on 04/26/2018). 30 to 40% tandem lesions in the RCA. Cutting Balloon angioplasty of the ramus intermediate ostial high-grade stenosis, 99% reduced to 0% and Cutting Balloon angioplasty of the mid LAD followed by stenting with 2.5 x 32 mm Synergy DES for high-grade 90% stenosis reduced to 0% and TIMI-3 to TIMI-3 flow maintained in both lesions.  EKG 02/09/19: Sinus bradycardia at rate of 55 bpm, left bundle branch block.  No further analysis.  No significant change from prior EKG 02/08/2019.  Scheduled Remote ICD check 08/01/2020:   No AHR or VHR episodes, no mode switches.  Health trends (patient activity, heart rate variability, average heart rates) are stable. Trans-thoracic impedance trends and the Optivol Fluid Index do no present significant abnormalities. Battery longevity is 9.1  years. RA pacing is 4.0 %, RV pacing is 98.1 %, and LV pacing is 98.1 %.  Assessment:     ICD-10-CM   1. Acute combined systolic and diastolic heart failure (HCC)  I50.41 EKG 58-NIDP    BASIC METABOLIC PANEL WITH GFR    Hemoglobin and hematocrit, blood    Pro b natriuretic peptide (BNP)    Magnesium  2. Ischemic cardiomyopathy  I25.5   3. Biventricular ICD (implantable cardioverter-defibrillator) in place  Z95.810   4. Mixed hyperlipidemia  E78.2   5. Left bundle branch block  I44.7   6. Coronary artery disease involving native coronary artery of native heart without angina pectoris  I25.10   7. Status post primary angioplasty with coronary stent  Z95.5   8. Essential hypertension  I10   9. On supplemental oxygen therapy  Z99.81  10. Type 2 diabetes mellitus with other circulatory complication, with long-term current use of insulin (HCC)  E11.59    Z79.4   11. Hypomagnesemia  E83.42    Recommendations:  Acute on chronic combined systolic and diastolic heart failure, stage C, NYHA class III:  Ejection Fraction noted on last 2D Echo  Recommend daily weight check, strict I/O's  Fluid restriction to <2L per day, Na restriction < 1.5g per day.  Medications reconciled.  Patient was given a prescription for Aldactone 12.5 mg p.o. daily but instead has been taking 25 mg p.o. daily.  She has gained weight since last office visit as noted above.  Patient was on torsemide 20 mg p.o. every morning but this was held since the initiation of Entresto.  We will have to reevaluate this after her lab work is checked.  Will order BMP, BNP, magnesium level.  Most recent device interrogation report reviewed and discussed with the patient.  Ischemic cardiomyopathy: See above  Biventricular ICD in situ:  Most recent interrogation reviewed.  Continue to monitor  Established coronary artery disease with prior angioplasty and stents:  Continue aspirin and Plavix.  Patient does not endorse  any evidence of bleeding.  No use of sublingual nitroglycerin tablets since last visit.  Medications reconciled.  Hypomagnesemia: Currently taking magnesium supplements 500 mg p.o. 3 times daily.  Check magnesium levels  Insulin-dependent diabetes mellitus type 2: Currently the primary team.  Long-term insulin use: See above  Mixed hyperlipidemia:  Currently managed by primary care provider.  Recommend an LDL of 70 mg/dL or less.  Reeducated on the importance of lifestyle and dietary changes.  Currently on Crestor 40 mg p.o. nightly and Zetia at 10 mg p.o. daily  Current Outpatient Medications:  .  alendronate (FOSAMAX) 70 MG tablet, Take 1 tablet by mouth daily., Disp: , Rfl:  .  ALPRAZolam (XANAX) 0.25 MG tablet, Take 1 tablet (0.25 mg total) by mouth as directed. 1/2 tablet r twice daily or one tab at night for anxiety, Disp: 30 tablet, Rfl: 0 .  aspirin EC 81 MG tablet, Take 81 mg by mouth daily., Disp: , Rfl:  .  betamethasone valerate ointment (VALISONE) 0.1 %, Apply 1 application topically 2 (two) times daily., Disp: 15 g, Rfl: 1 .  Calcium Carb-Cholecalciferol (CALCIUM 600 + D PO), Take 1 tablet by mouth daily. , Disp: , Rfl:  .  clopidogrel (PLAVIX) 75 MG tablet, Take 1 tablet (75 mg total) by mouth daily., Disp: 90 tablet, Rfl: 1 .  diphenhydrAMINE (BENADRYL) 25 mg capsule, Take 25 mg by mouth every 6 (six) hours as needed., Disp: , Rfl:  .  ENTRESTO 49-51 MG, TAKE 1 TABLET BY MOUTH 2 (TWO) TIMES DAILY., Disp: 60 tablet, Rfl: 0 .  ezetimibe (ZETIA) 10 MG tablet, Take 1 tablet (10 mg total) by mouth daily., Disp: 90 tablet, Rfl: 3 .  FLUoxetine (PROZAC) 20 MG tablet, Take 20 mg by mouth daily., Disp: , Rfl:  .  hydrALAZINE (APRESOLINE) 50 MG tablet, Take 1 tablet (50 mg total) by mouth 3 (three) times daily., Disp: 90 tablet, Rfl: 2 .  insulin lispro (HUMALOG) 100 UNIT/ML injection, Inject 2-6 Units into the skin See admin instructions. For use with V - GO 20 Insulin Delivery  Device: inject 6 units subcutaneously three times daily before meals, inject 2 units at bedtime if eating a bedtime snack, Disp: , Rfl:  .  Investigational - Study Medication, Take 1 tablet by mouth daily. Study name: dapagliflozin/metformin ER  10/1000mg  Additional study details: This is a Drug Study medication from Dr. Nadyne Coombes at [facility]. Patient started taking this medication on 04/22/18 and is unsure of how long she is to take this medication for. Per patient, she is on this medication as part of a Heart and Diabetes drug study., Disp: 1 each, Rfl: PRN .  isosorbide mononitrate (IMDUR) 60 MG 24 hr tablet, Take 60 mg by mouth daily., Disp: , Rfl:  .  levothyroxine (SYNTHROID, LEVOTHROID) 50 MCG tablet, Take 50 mcg by mouth daily before breakfast. , Disp: , Rfl:  .  loperamide (IMODIUM) 2 MG capsule, Take by mouth as needed for diarrhea or loose stools., Disp: , Rfl:  .  Magnesium Oxide 500 MG TABS, TAKE 1 TABLET (500 MG TOTAL) BY MOUTH IN THE MORNING AND AT BEDTIME. (Patient taking differently: Take 500 mg by mouth in the morning, at noon, and at bedtime. ), Disp: 180 tablet, Rfl: 1 .  metoCLOPramide (REGLAN) 5 MG tablet, Take 5 mg by mouth 4 (four) times daily., Disp: , Rfl:  .  mupirocin ointment (BACTROBAN) 2 %, Apply 1 application topically in the morning, at noon, and at bedtime. Apply to affected area, Disp: , Rfl:  .  nitroGLYCERIN (NITROSTAT) 0.4 MG SL tablet, PLACE 1 TABLET UNDER THE TONGUE EVERY 5 MINUTES X 3 DOSES AS NEEDED FOR CHEST PAIN., Disp: 25 tablet, Rfl: 2 .  ondansetron (ZOFRAN) 4 MG tablet, Take 4 mg by mouth every 8 (eight) hours as needed for nausea or vomiting., Disp: , Rfl:  .  ONETOUCH VERIO test strip, 1 each 3 (three) times daily. , Disp: , Rfl: 5 .  pantoprazole (PROTONIX) 40 MG tablet, Take 1 tablet (40 mg total) by mouth 2 (two) times daily. (Patient taking differently: Take 40 mg by mouth daily as needed (acid reflux). ), Disp: 60 tablet, Rfl: 1 .  rosuvastatin  (CRESTOR) 40 MG tablet, Take 1 tablet (40 mg total) by mouth at bedtime., Disp: 90 tablet, Rfl: 3 .  spironolactone (ALDACTONE) 25 MG tablet, Take 25 mg by mouth daily., Disp: , Rfl:  .  Study - DAPA TIMI 68 - dapagliflozin (FARXIGA) 10 mg or placebo tablet (PI-Dalton Aundra Dubin), Take 1 tablet by mouth daily., Disp: , Rfl:  .  UNABLE TO FIND, Oxygen  Pt takes 2 liters a day - on and off during the day as needed, Disp: , Rfl:  .  vitamin B-12 (CYANOCOBALAMIN) 1000 MCG tablet, Take 1,000 mcg by mouth daily. , Disp: , Rfl:  .  Vitamin D, Ergocalciferol, (DRISDOL) 50000 units CAPS capsule, Take 50,000 Units by mouth every Thursday. , Disp: , Rfl:  .  Fexofenadine HCl (ALLERGY 24-HR PO), Take by mouth., Disp: , Rfl:  .  glucose blood (ONETOUCH VERIO) test strip, 1 each 3 (three) times daily. , Disp: , Rfl:   Orders Placed This Encounter  Procedures  . BASIC METABOLIC PANEL WITH GFR  . Hemoglobin and hematocrit, blood  . Pro b natriuretic peptide (BNP)  . Magnesium  . EKG 12-Lead   --Continue cardiac medications as reconciled in final medication list. --Return in about 2 weeks (around 08/28/2020) for heart failure management.. Or sooner if needed. --Continue follow-up with your primary care physician regarding the management of your other chronic comorbid conditions.  Patient's questions and concerns were addressed to her satisfaction. She voices understanding of the instructions provided during this encounter.   This note was created using a voice recognition software as a result there may be  grammatical errors inadvertently enclosed that do not reflect the nature of this encounter. Every attempt is made to correct such errors.  Total time spent 35 minutes given the acute exacerbation of systolic/diastolic heart failure.  Will order labs prior to medication changes.  Patient is asked to get the blood work done after today's office visit.  I will see her back in close follow-up in 2 weeks to  reevaluate her symptoms.  Patient is asked to check her weight daily and to call us back if her weight continues to increase more than 2 pounds per day or 4 pounds in a week.  She verbalized understanding and provides verbal feedback.  Rex Kras, [place], [facility]  Pager: [phone] Office: [phone]

## 2020-08-15 LAB — HEMOGLOBIN AND HEMATOCRIT, BLOOD
Hematocrit: 34.2 % (ref 34.0–46.6)
Hemoglobin: 11.9 g/dL (ref 11.1–15.9)

## 2020-08-15 LAB — BASIC METABOLIC PANEL
BUN/Creatinine Ratio: 20 (ref 12–28)
BUN: 20 mg/dL (ref 8–27)
CO2: 21 mmol/L (ref 20–29)
Calcium: 9.1 mg/dL (ref 8.7–10.3)
Chloride: 109 mmol/L — ABNORMAL HIGH (ref 96–106)
Creatinine, Ser: 0.99 mg/dL (ref 0.57–1.00)
GFR calc Af Amer: 63 mL/min/{1.73_m2} (ref >59)
GFR calc non Af Amer: 54 mL/min/{1.73_m2} — ABNORMAL LOW (ref >59)
Glucose: 230 mg/dL — ABNORMAL HIGH (ref 65–99)
Potassium: 3.7 mmol/L (ref 3.5–5.2)
Sodium: 143 mmol/L (ref 134–144)

## 2020-08-15 LAB — PRO B NATRIURETIC PEPTIDE: NT-Pro BNP: 7991 pg/mL — ABNORMAL HIGH (ref 0–738)

## 2020-08-15 LAB — MAGNESIUM: Magnesium: 1.8 mg/dL (ref 1.6–2.3)

## 2020-08-16 ENCOUNTER — Other Ambulatory Visit: Payer: Self-pay | Admitting: Cardiology

## 2020-08-16 ENCOUNTER — Telehealth: Payer: Self-pay

## 2020-08-16 DIAGNOSIS — I5041 Acute combined systolic (congestive) and diastolic (congestive) heart failure: Secondary | ICD-10-CM

## 2020-08-16 MED ORDER — TORSEMIDE 10 MG PO TABS: 10.0000 mg | ORAL_TABLET | Freq: Every morning | ORAL | 0 refills | Status: DC

## 2020-08-16 NOTE — Telephone Encounter (Signed)
Called pt to inform her about her lab results. And explain to her to start taking Torsemide. Pt understood.

## 2020-08-16 NOTE — Telephone Encounter (Signed)
-----   Message from [place], [place] sent at 08/16/2020 10:07 AM EDT ----- Please inform the patient that her kidney function is stable.  Start torsemide 10 mg p.o. every morning.  Repeat blood work in 1 week.  Please have her check her daily weights and call us back in 1 week to make sure they are downtrending.  Prescription sent to the pharmacy and labs placed and released.

## 2020-08-30 LAB — BASIC METABOLIC PANEL
BUN/Creatinine Ratio: 23 (ref 12–28)
BUN: 24 mg/dL (ref 8–27)
CO2: 26 mmol/L (ref 20–29)
Calcium: 9.3 mg/dL (ref 8.7–10.3)
Chloride: 106 mmol/L (ref 96–106)
Creatinine, Ser: 1.04 mg/dL — ABNORMAL HIGH (ref 0.57–1.00)
GFR calc Af Amer: 59 mL/min/{1.73_m2} — ABNORMAL LOW (ref >59)
GFR calc non Af Amer: 51 mL/min/{1.73_m2} — ABNORMAL LOW (ref >59)
Glucose: 211 mg/dL — ABNORMAL HIGH (ref 65–99)
Potassium: 3.9 mmol/L (ref 3.5–5.2)
Sodium: 144 mmol/L (ref 134–144)

## 2020-08-30 LAB — PRO B NATRIURETIC PEPTIDE: NT-Pro BNP: 3594 pg/mL — ABNORMAL HIGH (ref 0–738)

## 2020-08-30 LAB — MAGNESIUM: Magnesium: 2 mg/dL (ref 1.6–2.3)

## 2020-08-31 ENCOUNTER — Other Ambulatory Visit: Payer: Self-pay | Admitting: Cardiology

## 2020-08-31 DIAGNOSIS — I5041 Acute combined systolic (congestive) and diastolic (congestive) heart failure: Secondary | ICD-10-CM

## 2020-08-31 MED ORDER — SACUBITRIL-VALSARTAN 49-51 MG PO TABS: 1.0000 | ORAL_TABLET | Freq: Two times a day (BID) | ORAL | 0 refills | Status: DC

## 2020-09-06 ENCOUNTER — Telehealth: Payer: Self-pay | Admitting: Cardiology

## 2020-09-07 NOTE — Telephone Encounter (Signed)
Called and spoke with patient regarding her ICD check results.

## 2020-09-09 ENCOUNTER — Other Ambulatory Visit: Payer: Self-pay | Admitting: Cardiology

## 2020-09-09 DIAGNOSIS — I5041 Acute combined systolic (congestive) and diastolic (congestive) heart failure: Secondary | ICD-10-CM

## 2020-09-11 ENCOUNTER — Ambulatory Visit: Payer: Medicare PPO | Admitting: Cardiology

## 2020-10-02 ENCOUNTER — Other Ambulatory Visit: Payer: Self-pay

## 2020-10-02 ENCOUNTER — Encounter: Payer: Self-pay | Admitting: Cardiology

## 2020-10-02 ENCOUNTER — Ambulatory Visit: Payer: Medicare PPO | Admitting: Cardiology

## 2020-10-02 VITALS — BP 163/66 | HR 57 | Ht 62.0 in | Wt 174.0 lb

## 2020-10-02 DIAGNOSIS — I10 Essential (primary) hypertension: Secondary | ICD-10-CM

## 2020-10-02 DIAGNOSIS — Z9981 Dependence on supplemental oxygen: Secondary | ICD-10-CM

## 2020-10-02 DIAGNOSIS — I447 Left bundle-branch block, unspecified: Secondary | ICD-10-CM

## 2020-10-02 DIAGNOSIS — I5042 Chronic combined systolic (congestive) and diastolic (congestive) heart failure: Secondary | ICD-10-CM

## 2020-10-02 DIAGNOSIS — E782 Mixed hyperlipidemia: Secondary | ICD-10-CM

## 2020-10-02 DIAGNOSIS — I255 Ischemic cardiomyopathy: Secondary | ICD-10-CM

## 2020-10-02 DIAGNOSIS — I2510 Atherosclerotic heart disease of native coronary artery without angina pectoris: Secondary | ICD-10-CM

## 2020-10-02 DIAGNOSIS — Z95810 Presence of automatic (implantable) cardiac defibrillator: Secondary | ICD-10-CM

## 2020-10-02 DIAGNOSIS — Z955 Presence of coronary angioplasty implant and graft: Secondary | ICD-10-CM

## 2020-10-02 NOTE — Progress Notes (Signed)
Primary Physician:  [name], PA-C   [name], female    DOB: 1941-06-11, 79 y.o.   MRN: [id number]   Date: 10/02/20 Last Office Visit: 08/14/2020  Subjective:    Chief Complaint  Patient presents with  . Congestive Heart Failure    HPI: Sara Graves  is a 78 y.o. female  with hypertension, insulin-dependent diabetes mellitus type 2, CAD s/p prior LAD and ramus PCI, HFrEF, ischemic cardiomyopathy, s/p BiV ICD implantation 06/2019,  h/o Rt leg cellulitis and metatarsal fracture (managed by Podiatry) with chief complaint of "heart failure management."   Congestive heart failure: Since last office visit patient has not been hospitalized for congestive heart failure.  Her last hospitalization for CHF was back in March 2020.  Patient states that she is compliant with her current medical therapy.  Medications reconciled. She denies orthopnea, paroxysmal nocturnal dyspnea.  Her lower extremity swelling has improved since last office visit since she started back on torsemide.  Since last visit she has lost weight.  She is tolerated up titration of Entresto and reinitiation of torsemide and her fluid status has improved.  Most recent device interrogation also reviewed with the patient at today's visit.  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   Her recent coronary angiogram was on 01/27/19 and PCI to LAD  and balloon PTCA to IST RI lesion.  Does not complain of chest discomfort at rest or with effort related activities.  No recent use of sublingual nitroglycerin tablets.  She underwent echocardiogram on 05/19/19 that continued to reveal depressed LVEF of 20-25%.  She was evaluated by Dr. Lovena Le and underwent BiV ICD implantation on 06/21/2019.  Past Medical History:  Diagnosis Date  . Acute combined systolic and diastolic heart failure ([place]) 04/25/2018  .  Anxiety   . Arthritis    "hands" (12/29/2017)  . Basal cell carcinoma of nose    removed  . Bursitis of left shoulder   . BV ICD Medtronic [place] MRI CRTD 06/21/2019  . Cat scratch fever    Late 90s  . Chronic combined systolic and diastolic CHF (congestive heart failure) ([place]) 06/08/2019  . Coronary artery disease   . Depression   . Diabetes mellitus, type II, insulin dependent ([place])   . Encounter for assessment of implantable cardioverter-defibrillator (ICD) 09/28/2019  . GERD (gastroesophageal reflux disease)   . H. pylori infection 2008 and 1998   treated  . Hyperlipidemia   . Hypertension   . Hypothyroidism   . Low oxygen saturation   . Multinodular goiter   . Rotator cuff tear, left recurrent   . Urge urinary incontinence   . UTI (lower urinary tract infection) 05/2016    Past Surgical History:  Procedure Laterality Date  . APPENDECTOMY  AGE 23  . BACK SURGERY    . BASAL CELL CARCINOMA EXCISION     "nose"  . BIV ICD INSERTION CRT-D N/A 06/21/2019   Procedure: BIV ICD INSERTION CRT-D;  Surgeon: Evans Lance, MD;  Location: [facility];  Service: Cardiovascular;  Laterality: N/A;  . BLADDER NECK SUSPENSION  1970's  . BUNIONECTOMY WITH HAMMERTOE RECONSTRUCTION Bilateral   . CARPAL TUNNEL RELEASE Right 05/2017  . COLONOSCOPY W/ POLYPECTOMY    . CORONARY ANGIOPLASTY WITH STENT PLACEMENT  12/29/2017  . CORONARY BALLOON ANGIOPLASTY N/A 02/08/2019   Procedure: CORONARY BALLOON ANGIOPLASTY;  Surgeon: Adrian Prows, MD;  Location: [facility];  Service: Cardiovascular;  Laterality: N/A;  . CORONARY STENT INTERVENTION N/A 12/29/2017   Procedure: CORONARY STENT INTERVENTION;  Surgeon: Nigel Mormon, MD;  Location: [facility];  Service: Cardiovascular;  Laterality: N/A;  . CORONARY STENT INTERVENTION N/A 04/26/2018   Procedure: CORONARY STENT INTERVENTION;  Surgeon: Nigel Mormon, MD;  Location: [facility];  Service: Cardiovascular;   Laterality: N/A;  . CORONARY STENT INTERVENTION N/A 02/08/2019   Procedure: CORONARY STENT INTERVENTION;  Surgeon: Adrian Prows, MD;  Location: [place] [facility];  Service: Cardiovascular;  Laterality: N/A;  . ESOPHAGOGASTRODUODENOSCOPY ENDOSCOPY    . FORAMINAL DECOMPRESSION AT L2 TO THE SACRUM  01-05-2008   L2  -  S1  . GANGLION CYST EXCISION Left 01/17/2009   ring finger  . IMPLANTATION PERMANENT SPINAL CORD STIMULATOR  06-15-2008   JUNE 2013--  BATTERY CHANGE  . JOINT REPLACEMENT    . LEFT HEART CATH AND CORONARY ANGIOGRAPHY N/A 04/26/2018   Procedure: LEFT HEART CATH AND CORONARY ANGIOGRAPHY;  Surgeon: Nigel Mormon, MD;  Location: [facility];  Service: Cardiovascular;  Laterality: N/A;  . LEFT HEART CATH AND CORONARY ANGIOGRAPHY N/A 02/08/2019   Procedure: LEFT HEART CATH AND CORONARY ANGIOGRAPHY;  Surgeon: Adrian Prows, MD;  Location: [facility];  Service: Cardiovascular;  Laterality: N/A;  . LIPOMA EXCISION Right    RIGHT ELBOW  . METATARSAL HEAD EXCISION Right 06/16/2018   Procedure: right 5th metatarsal excision, a mini c-arm;  Surgeon: Trula Slade, DPM;  Location: [facility];  Service: Podiatry;  Laterality: Right;  anesthesia can do block  . RIGHT/LEFT HEART CATH AND CORONARY ANGIOGRAPHY N/A 12/29/2017   Procedure: RIGHT/LEFT HEART CATH AND CORONARY ANGIOGRAPHY;  Surgeon: Nigel Mormon, MD;  Location: [facility];  Service: Cardiovascular;  Laterality: N/A;  . SHOULDER OPEN ROTATOR CUFF REPAIR  09/07/2012   Procedure: ROTATOR CUFF REPAIR SHOULDER OPEN;  Surgeon: Magnus Sinning, MD;  Location: [facility];  Service: Orthopedics;  Laterality: Left;  OPEN ANTERIOR ACROMIONECTOMY AND ROTATOR CUFF REPAIR ON LEFT   . SHOULDER OPEN ROTATOR CUFF REPAIR Left 12/28/2012   Procedure: ROTATOR CUFF REPAIR SHOULDER OPEN;  Surgeon: Magnus Sinning, MD;  Location: [facility];  Service: Orthopedics;  Laterality: Left;  OPEN SHOULDER  ROTATOR CUFF REPAIR ON LEFT  WITH ANTERIOR ACROMINECTOMY   . SHOULDER OPEN ROTATOR CUFF REPAIR Right 03/28/2003   RIGHT SHOULDER  DEGENERATIVE AC JOINT AND RC TEAR  . SPINAL CORD STIMULATOR BATTERY EXCHANGE N/A 07/03/2016   Procedure: SPINAL CORD STIMULATOR BATTERY PLACMENT;  Surgeon: Melina Schools, MD;  Location: [facility];  Service: Orthopedics;  Laterality: N/A;  . TONSILLECTOMY  AGE 52  . 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 VAGINAL HYSTERECTOMY  1970's   Social History   Tobacco Use  . Smoking status: Never Smoker  . Smokeless tobacco: Never Used  Vaping Use  . Vaping Use: Never used  Substance Use Topics  . Alcohol use: No  . Drug use: No    Review of Systems  Constitutional: Positive for weight loss. Negative for chills, decreased appetite, malaise/fatigue and weight gain.  Cardiovascular: Positive for dyspnea on exertion (stable). Negative for chest pain, orthopnea and syncope. Leg swelling: chronic and stable.  Respiratory: Negative for cough.   Endocrine: Negative for cold intolerance.  Hematologic/Lymphatic: Does not bruise/bleed easily.  Musculoskeletal: Positive for falls. Negative for joint  swelling.  Gastrointestinal: Negative for abdominal pain, anorexia, change in bowel habit, diarrhea and nausea.  Neurological: Negative for headaches and light-headedness.  Psychiatric/Behavioral: Negative for depression and substance abuse.  All other systems reviewed and are negative.     Objective:   Vitals with BMI 10/02/2020 08/14/2020 08/14/2020  Height 5\' 2"  - 5\' 2"   Weight 174 lbs - 182 lbs  BMI 35.70 - 17.79  Systolic 390 300 923  Diastolic 66 52 39  Pulse 57 76 75   Physical Exam Vitals reviewed.  Constitutional:      General: She is not in acute distress.    Appearance: She is well-developed.  HENT:     Head: Atraumatic.  Eyes:     Conjunctiva/sclera: Conjunctivae normal.  Neck:     Thyroid: No thyromegaly.     Vascular: No JVD.   Cardiovascular:     Rate and Rhythm: Normal rate and regular rhythm.     Pulses: Intact distal pulses.          Carotid pulses are 2+ on the right side with bruit and 2+ on the left side.      Femoral pulses are 2+ on the right side and 2+ on the left side.      Popliteal pulses are 0 on the right side and 0 on the left side.       Dorsalis pedis pulses are 0 on the right side and 0 on the left side.       Posterior tibial pulses are 0 on the right side and 0 on the left side.     Heart sounds: Murmur heard.  No gallop.      Comments: Pacemaker/ICD site is clean dry and intact.  Soft holosystolic murmur heard at the apex. Pulmonary:     Effort: Pulmonary effort is normal.     Breath sounds: Normal breath sounds.  Abdominal:     General: Bowel sounds are normal.     Palpations: Abdomen is soft.  Musculoskeletal:        General: Normal range of motion.     Cervical back: Neck supple.     Right lower leg: Edema present.     Left lower leg: No swelling. Edema present.  Skin:    General: Skin is warm and dry.  Neurological:     Mental Status: She is alert.    Radiology: No results found.  Laboratory examination:   CMP Latest Ref Rng & Units 08/29/2020 08/14/2020 07/18/2020  Glucose 65 - 99 mg/dL 211(H) 230(H) 261(H)  BUN 8 - 27 mg/dL 24 20 27   Creatinine 0.57 - 1.00 mg/dL 1.04(H) 0.99 0.99  Sodium 134 - 144 mmol/L 144 143 142  Potassium 3.5 - 5.2 mmol/L 3.9 3.7 4.1  Chloride 96 - 106 mmol/L 106 109(H) 106  CO2 20 - 29 mmol/L 26 21 22   Calcium 8.7 - 10.3 mg/dL 9.3 9.1 9.1  Total Protein 6.0 - 8.5 g/dL - - -  Total Bilirubin 0.0 - 1.2 mg/dL - - -  Alkaline Phos 39 - 117 IU/L - - -  AST 0 - 40 IU/L - - -  ALT 0 - 32 IU/L - - -   CBC Latest Ref Rng & Units 08/14/2020 06/17/2019 01/21/2019  WBC 3.4 - 10.8 x10E3/uL - 6.7 4.0  Hemoglobin 11.1 - 15.9 g/dL 11.9 13.9 11.1(L)  Hematocrit 34.0 - 46.6 % 34.2 43.7 34.3(L)  Platelets 150 - 450 x10E3/uL - 139(L) 112(L)   Lipid Panel  Component Value Date/Time   CHOL 171 02/07/2020 1524   TRIG 62 02/07/2020 1524   HDL 55 02/07/2020 1524   CHOLHDL 2.9 12/26/2019 1407   CHOLHDL 3.9 03/25/2015 0325   VLDL 23 03/25/2015 0325   LDLCALC 104 (H) 02/07/2020 1524   LDLDIRECT 90 04/08/2012 1414   HEMOGLOBIN A1C Lab Results  Component Value Date   HGBA1C 7.8 (H) 01/20/2019   MPG 177.16 01/20/2019   TSH Recent Labs    11/30/19 1437  TSH 3.100   BNP (last 3 results) No results for input(s): BNP in the last 8760 hours.  ProBNP (last 3 results) Recent Labs    06/26/20 1157 08/14/20 1609 08/29/20 1559  PROBNP 2,619* 7,991* 3,594*   PRN Meds:. There are no discontinued medications. Current Meds  Medication Sig  . alendronate (FOSAMAX) 70 MG tablet Take 1 tablet by mouth daily.  Marland Kitchen ALPRAZolam (XANAX) 0.25 MG tablet Take 1 tablet (0.25 mg total) by mouth as directed. 1/2 tablet r twice daily or one tab at night for anxiety  . aspirin EC 81 MG tablet Take 81 mg by mouth daily.  . betamethasone valerate ointment (VALISONE) 0.1 % Apply 1 application topically 2 (two) times daily.  . Calcium Carb-Cholecalciferol (CALCIUM 600 + D PO) Take 1 tablet by mouth daily.   . cephALEXin (KEFLEX) 500 MG capsule Take 500 mg by mouth 4 (four) times daily.  . clopidogrel (PLAVIX) 75 MG tablet Take 1 tablet (75 mg total) by mouth daily.  . diphenhydrAMINE (BENADRYL) 25 mg capsule Take 25 mg by mouth every 6 (six) hours as needed.  . ezetimibe (ZETIA) 10 MG tablet Take 1 tablet (10 mg total) by mouth daily.  Marland Kitchen Fexofenadine HCl (ALLERGY 24-HR PO) Take by mouth.  Marland Kitchen FLUoxetine (PROZAC) 20 MG tablet Take 20 mg by mouth daily.  Marland Kitchen gabapentin (NEURONTIN) 100 MG capsule Take 100 mg by mouth 3 (three) times daily.  Marland Kitchen glucose blood (ONETOUCH VERIO) test strip 1 each 3 (three) times daily.   . hydrALAZINE (APRESOLINE) 50 MG tablet Take 1 tablet (50 mg total) by mouth 3 (three) times daily.  . insulin lispro (HUMALOG) 100 UNIT/ML injection  Inject 2-6 Units into the skin See admin instructions. For use with V - GO 20 Insulin Delivery Device: inject 6 units subcutaneously three times daily before meals, inject 2 units at bedtime if eating a bedtime snack  . Investigational - Study Medication Take 1 tablet by mouth daily. Study name: dapagliflozin/metformin ER 10/1000mg  Additional study details: This is a Drug Study medication from Dr. Nadyne Coombes at [facility]. Patient started taking this medication on 04/22/18 and is unsure of how long she is to take this medication for. Per patient, she is on this medication as part of a Heart and Diabetes drug study.  . isosorbide mononitrate (IMDUR) 60 MG 24 hr tablet Take 60 mg by mouth daily.  Marland Kitchen levothyroxine (SYNTHROID, LEVOTHROID) 50 MCG tablet Take 50 mcg by mouth daily before breakfast.   . loperamide (IMODIUM) 2 MG capsule Take by mouth as needed for diarrhea or loose stools.  . Magnesium Oxide 500 MG TABS TAKE 1 TABLET (500 MG TOTAL) BY MOUTH IN THE MORNING AND AT BEDTIME. (Patient taking differently: Take 500 mg by mouth in the morning, at noon, and at bedtime. )  . metoCLOPramide (REGLAN) 5 MG tablet Take 5 mg by mouth 4 (four) times daily.  . mupirocin ointment (BACTROBAN) 2 % Apply 1 application topically in the morning, at noon, and at  bedtime. Apply to affected area  . nitroGLYCERIN (NITROSTAT) 0.4 MG SL tablet PLACE 1 TABLET UNDER THE TONGUE EVERY 5 MINUTES X 3 DOSES AS NEEDED FOR CHEST PAIN.  Marland Kitchen ondansetron (ZOFRAN) 4 MG tablet Take 4 mg by mouth every 8 (eight) hours as needed for nausea or vomiting.  Glory Rosebush VERIO test strip 1 each 3 (three) times daily.   . pantoprazole (PROTONIX) 40 MG tablet Take 1 tablet (40 mg total) by mouth 2 (two) times daily. (Patient taking differently: Take 40 mg by mouth daily as needed (acid reflux). )  . rosuvastatin (CRESTOR) 40 MG tablet Take 1 tablet (40 mg total) by mouth at bedtime.  . sacubitril-valsartan (ENTRESTO) 49-51 MG Take 1 tablet by  mouth 2 (two) times daily.  Marland Kitchen spironolactone (ALDACTONE) 25 MG tablet Take 25 mg by mouth daily.  . Study - DAPA TIMI 68 - dapagliflozin (FARXIGA) 10 mg or placebo tablet (PI-Dalton McLean) Take 1 tablet by mouth daily.  Marland Kitchen torsemide (DEMADEX) 10 MG tablet TAKE 1 TABLET (10 MG TOTAL) BY MOUTH IN THE MORNING.  Marland Kitchen UNABLE TO FIND Oxygen  Pt takes 2 liters a day - on and off during the day as needed  . vitamin B-12 (CYANOCOBALAMIN) 1000 MCG tablet Take 1,000 mcg by mouth daily.   . Vitamin D, Ergocalciferol, (DRISDOL) 50000 units CAPS capsule Take 50,000 Units by mouth every Thursday.     Cardiac Studies:   Echocardiogram 11/30/2019: LVEF 20%, left ventricular cavity dilated, moderate concentric LVH, severe hypokinetic global wall motion, grade 2 diastolic impairment, elevated left atrial pressure, severely dilated left atrium, mild to moderate AR, moderate to severe MR, moderate TR, moderate pulmonary hypertension RVSP 64 mmHg, elevated right atrial pressure, small pericardial effusion.  Carotid artery duplex 12/02/2018: No hemodynamically significant arterial disease in the internal carotid artery bilaterally. Minimal plaque noted bilateral carotid arteries. Antegrade right vertebral artery flow. Antegrade left vertebral artery flow.  Coronary angiogram 02/08/2019: Distal left main 20% diffuse disease. Ostial LAD stent shows diffuse 20% in-stent restenosis (3.0 x 15 mm resolute Onyx on 12/29/2017). Mid LAD and mid to distal LAD there are tandem lesions 80% and 95%. Ramus intermediate ostial 95 to 99% stenosis. Previously placed RI stent widely patent ( 2.0 x 12 mm resolute on 04/26/2018). 30 to 40% tandem lesions in the RCA. Cutting Balloon angioplasty of the ramus intermediate ostial high-grade stenosis, 99% reduced to 0% and Cutting Balloon angioplasty of the mid LAD followed by stenting with 2.5 x 32 mm Synergy DES for high-grade 90% stenosis reduced to 0% and TIMI-3 to TIMI-3 flow  maintained in both lesions.  EKG 02/09/19: Sinus bradycardia at rate of 55 bpm, left bundle branch block.  No further analysis.  No significant change from prior EKG 02/08/2019.  Scheduled Remote ICD check 08/29/2020:   No AHR or VHR episodes, no mode switches.  VSE (brief few seconds) NSVT/SVT or brief PAF. Health trends (patient activity, heart rate variability, average heart rates) are stable. Trans-thoracic impedance trends and the Optivol Fluid Index do no present significant abnormalities. Battery longevity is 9.0 years. RA pacing is 4.0 %, RV pacing is 3.4 %, and LV pacing is 98.2 %.  Assessment:     ICD-10-CM   1. Chronic combined systolic and diastolic CHF, NYHA class 3 (HCC)  B15.17 Basic metabolic panel    Magnesium    Pro b natriuretic peptide (BNP)  2. Ischemic cardiomyopathy  O16.0 Basic metabolic panel    Magnesium    Pro b  natriuretic peptide (BNP)  3. Biventricular ICD (implantable cardioverter-defibrillator) in place  Z95.810   4. Coronary artery disease involving native coronary artery of native heart without angina pectoris  I25.10   5. Status post primary angioplasty with coronary stent  Z95.5   6. Mixed hyperlipidemia  E78.2   7. Left bundle branch block  I44.7   8. Essential hypertension  I10   9. On supplemental oxygen therapy  Z99.81    Recommendations:  Chronic combined systolic and diastolic heart failure, stage C, NYHA class II:  Ejection Fraction noted on last 2D Echo  Recommend daily weight check, strict I/O's  Fluid restriction to <2L per day, Na restriction < 1.5g per day.  Medications reconciled.  She has lost weight since last office visit as noted above.  Discussed uptitrating Entresto to 97/103 mg p.o. twice daily.  However, patient states that she would like to stay on the current dose as her medications have stabilized and shows her kidney function.  In addition she remains euvolemic and asymptomatic from heart failure symptoms.  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  Most recent device interrogation report reviewed and discussed with the patient.  We will check blood work prior to the next office visit.   Ischemic cardiomyopathy: See above  Biventricular ICD in situ:  Most recent interrogation reviewed.  Continue to monitor  Established coronary artery disease with prior angioplasty and stents:  Continue aspirin and Plavix.  Patient does not endorse any evidence of bleeding.  No use of sublingual nitroglycerin tablets since last visit.  Medications reconciled.  Hypomagnesemia: Currently taking magnesium supplements 500 mg p.o. 3 times daily.  Insulin-dependent diabetes mellitus type 2: Currently the primary team. Most recent a1c reviewed.   Long-term insulin use: See above  Mixed hyperlipidemia:  Currently managed by primary care provider.  Recommend an LDL of 70 mg/dL or less.  Reeducated on the importance of lifestyle and dietary changes.  Currently on Crestor 40 mg p.o. nightly and Zetia at 10 mg p.o. daily  Current Outpatient Medications:  .  alendronate (FOSAMAX) 70 MG tablet, Take 1 tablet by mouth daily., Disp: , Rfl:  .  ALPRAZolam (XANAX) 0.25 MG tablet, Take 1 tablet (0.25 mg total) by mouth as directed. 1/2 tablet r twice daily or one tab at night for anxiety, Disp: 30 tablet, Rfl: 0 .  aspirin EC 81 MG tablet, Take 81 mg by mouth daily., Disp: , Rfl:  .  betamethasone valerate ointment (VALISONE) 0.1 %, Apply 1 application topically 2 (two) times daily., Disp: 15 g, Rfl: 1 .  Calcium Carb-Cholecalciferol (CALCIUM 600 + D PO), Take 1 tablet by mouth daily. , Disp: , Rfl:  .  cephALEXin (KEFLEX) 500 MG capsule, Take 500 mg by mouth 4 (four) times daily., Disp: , Rfl:  .  clopidogrel (PLAVIX) 75 MG tablet, Take 1 tablet (75 mg  total) by mouth daily., Disp: 90 tablet, Rfl: 1 .  diphenhydrAMINE (BENADRYL) 25 mg capsule, Take 25 mg by mouth every 6 (six) hours as needed., Disp: , Rfl:  .  ezetimibe (ZETIA) 10 MG tablet, Take 1 tablet (10 mg total) by mouth daily., Disp: 90 tablet, Rfl: 3 .  Fexofenadine HCl (ALLERGY 24-HR PO), Take by mouth., Disp: , Rfl:  .  FLUoxetine (PROZAC) 20 MG  tablet, Take 20 mg by mouth daily., Disp: , Rfl:  .  gabapentin (NEURONTIN) 100 MG capsule, Take 100 mg by mouth 3 (three) times daily., Disp: , Rfl:  .  glucose blood (ONETOUCH VERIO) test strip, 1 each 3 (three) times daily. , Disp: , Rfl:  .  hydrALAZINE (APRESOLINE) 50 MG tablet, Take 1 tablet (50 mg total) by mouth 3 (three) times daily., Disp: 90 tablet, Rfl: 2 .  insulin lispro (HUMALOG) 100 UNIT/ML injection, Inject 2-6 Units into the skin See admin instructions. For use with V - GO 20 Insulin Delivery Device: inject 6 units subcutaneously three times daily before meals, inject 2 units at bedtime if eating a bedtime snack, Disp: , Rfl:  .  Investigational - Study Medication, Take 1 tablet by mouth daily. Study name: dapagliflozin/metformin ER 10/1000mg  Additional study details: This is a Drug Study medication from Dr. Nadyne Coombes at [facility]. Patient started taking this medication on 04/22/18 and is unsure of how long she is to take this medication for. Per patient, she is on this medication as part of a Heart and Diabetes drug study., Disp: 1 each, Rfl: PRN .  isosorbide mononitrate (IMDUR) 60 MG 24 hr tablet, Take 60 mg by mouth daily., Disp: , Rfl:  .  levothyroxine (SYNTHROID, LEVOTHROID) 50 MCG tablet, Take 50 mcg by mouth daily before breakfast. , Disp: , Rfl:  .  loperamide (IMODIUM) 2 MG capsule, Take by mouth as needed for diarrhea or loose stools., Disp: , Rfl:  .  Magnesium Oxide 500 MG TABS, TAKE 1 TABLET (500 MG TOTAL) BY MOUTH IN THE MORNING AND AT BEDTIME. (Patient taking differently: Take 500 mg by mouth in the morning,  at noon, and at bedtime. ), Disp: 180 tablet, Rfl: 1 .  metoCLOPramide (REGLAN) 5 MG tablet, Take 5 mg by mouth 4 (four) times daily., Disp: , Rfl:  .  mupirocin ointment (BACTROBAN) 2 %, Apply 1 application topically in the morning, at noon, and at bedtime. Apply to affected area, Disp: , Rfl:  .  nitroGLYCERIN (NITROSTAT) 0.4 MG SL tablet, PLACE 1 TABLET UNDER THE TONGUE EVERY 5 MINUTES X 3 DOSES AS NEEDED FOR CHEST PAIN., Disp: 25 tablet, Rfl: 2 .  ondansetron (ZOFRAN) 4 MG tablet, Take 4 mg by mouth every 8 (eight) hours as needed for nausea or vomiting., Disp: , Rfl:  .  ONETOUCH VERIO test strip, 1 each 3 (three) times daily. , Disp: , Rfl: 5 .  pantoprazole (PROTONIX) 40 MG tablet, Take 1 tablet (40 mg total) by mouth 2 (two) times daily. (Patient taking differently: Take 40 mg by mouth daily as needed (acid reflux). ), Disp: 60 tablet, Rfl: 1 .  rosuvastatin (CRESTOR) 40 MG tablet, Take 1 tablet (40 mg total) by mouth at bedtime., Disp: 90 tablet, Rfl: 3 .  sacubitril-valsartan (ENTRESTO) 49-51 MG, Take 1 tablet by mouth 2 (two) times daily., Disp: 180 tablet, Rfl: 0 .  spironolactone (ALDACTONE) 25 MG tablet, Take 25 mg by mouth daily., Disp: , Rfl:  .  Study - DAPA TIMI 68 - dapagliflozin (FARXIGA) 10 mg or placebo tablet (PI-Dalton Aundra Dubin), Take 1 tablet by mouth daily., Disp: , Rfl:  .  torsemide (DEMADEX) 10 MG tablet, TAKE 1 TABLET (10 MG TOTAL) BY MOUTH IN THE MORNING., Disp: 30 tablet, Rfl: 0 .  UNABLE TO FIND, Oxygen  Pt takes 2 liters a day - on and off during the day as needed, Disp: , Rfl:  .  vitamin B-12 (  CYANOCOBALAMIN) 1000 MCG tablet, Take 1,000 mcg by mouth daily. , Disp: , Rfl:  .  Vitamin D, Ergocalciferol, (DRISDOL) 50000 units CAPS capsule, Take 50,000 Units by mouth every Thursday. , Disp: , Rfl:   Orders Placed This Encounter  Procedures  . Basic metabolic panel  . Magnesium  . Pro b natriuretic peptide (BNP)   --Continue cardiac medications as reconciled in  final medication list. --Return in about 3 months (around 01/07/2021) for heart failure management. labs before the visit. . Or sooner if needed. --Continue follow-up with your primary care physician regarding the management of your other chronic comorbid conditions.  Patient's questions and concerns were addressed to her satisfaction. She voices understanding of the instructions provided during this encounter.   This note was created using a voice recognition software as a result there may be grammatical errors inadvertently enclosed that do not reflect the nature of this encounter. Every attempt is made to correct such errors.  Rex Kras, [place], [facility]  Pager: [phone] Office: [phone]

## 2020-10-02 NOTE — Patient Instructions (Signed)
Labs to be performed on or after January 01, 2021.

## 2020-10-12 ENCOUNTER — Other Ambulatory Visit: Payer: Self-pay | Admitting: Cardiology

## 2020-10-12 DIAGNOSIS — I5041 Acute combined systolic (congestive) and diastolic (congestive) heart failure: Secondary | ICD-10-CM

## 2020-10-13 ENCOUNTER — Telehealth: Payer: Self-pay | Admitting: Cardiology

## 2020-10-15 NOTE — Telephone Encounter (Signed)
Called and spoke with patient regarding her remote ICD check results.

## 2020-10-26 ENCOUNTER — Telehealth: Payer: Self-pay

## 2020-10-26 NOTE — Telephone Encounter (Signed)
Patient called stating that she is having fluid leaking out of her legs and is currently on Torsemide. She would like for you to give her a callback.Number on file is correct.

## 2020-10-28 NOTE — Telephone Encounter (Signed)
Remind me to call her. If I am off and something like this could have been addressed by [facility]

## 2020-10-30 ENCOUNTER — Other Ambulatory Visit: Payer: Self-pay | Admitting: Cardiology

## 2020-10-30 DIAGNOSIS — I5041 Acute combined systolic (congestive) and diastolic (congestive) heart failure: Secondary | ICD-10-CM

## 2020-10-30 NOTE — Telephone Encounter (Signed)
I DID NOT

## 2020-10-30 NOTE — Telephone Encounter (Signed)
Did you call this pt or do I need to ask celeste to call her?

## 2020-10-31 NOTE — Telephone Encounter (Signed)
Spoke with patient to follow up on lew swelling. She informed that swelling worsened last week and she developed blistered that burst and were bleeding. When blisters burst she presented to [facility] ED for evaluation on 10/29/2020. She was treated for her bilateral lower leg ulcers and wound dressings were applied. She was also referred to wound care. She has follow up appointment with wound care next week.

## 2020-10-31 NOTE — Telephone Encounter (Signed)
Can you call this patient back? AD/S

## 2020-10-31 NOTE — Telephone Encounter (Signed)
Hey I am happy to call her if [name] hasn't had a chance yet.

## 2020-11-06 ENCOUNTER — Telehealth: Payer: Self-pay | Admitting: Cardiology

## 2020-11-07 ENCOUNTER — Encounter (HOSPITAL_BASED_OUTPATIENT_CLINIC_OR_DEPARTMENT_OTHER): Payer: Medicare PPO | Attending: Physician Assistant | Admitting: Physician Assistant

## 2020-11-07 ENCOUNTER — Other Ambulatory Visit: Payer: Self-pay

## 2020-11-07 DIAGNOSIS — I5042 Chronic combined systolic (congestive) and diastolic (congestive) heart failure: Secondary | ICD-10-CM | POA: Diagnosis not present

## 2020-11-07 DIAGNOSIS — I872 Venous insufficiency (chronic) (peripheral): Secondary | ICD-10-CM | POA: Diagnosis not present

## 2020-11-07 DIAGNOSIS — Z9981 Dependence on supplemental oxygen: Secondary | ICD-10-CM | POA: Insufficient documentation

## 2020-11-07 DIAGNOSIS — L97822 Non-pressure chronic ulcer of other part of left lower leg with fat layer exposed: Secondary | ICD-10-CM | POA: Diagnosis not present

## 2020-11-07 DIAGNOSIS — I110 Hypertensive heart disease with heart failure: Secondary | ICD-10-CM | POA: Insufficient documentation

## 2020-11-07 DIAGNOSIS — E11622 Type 2 diabetes mellitus with other skin ulcer: Secondary | ICD-10-CM | POA: Diagnosis not present

## 2020-11-07 DIAGNOSIS — L97812 Non-pressure chronic ulcer of other part of right lower leg with fat layer exposed: Secondary | ICD-10-CM | POA: Insufficient documentation

## 2020-11-07 NOTE — Telephone Encounter (Signed)
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 She reports increased leg swelling over the last 2 weeks. Advised her to take torsemide 10 mg twice daily for 2 days and to notify our office if symptoms worsen or do not improve. Patient verbalized understanding and agreement. Also discussed regarding signs/sympomts that would warrant urgent evaluation.  Patient has follow up appointment with wound care today. She will be seen in our office 11/19/2020 by Dr. Terri Skains for follow up.

## 2020-11-07 NOTE — Progress Notes (Signed)
Sara Graves ([id number]) , Visit Report for 11/07/2020 Allergy List Details Patient Name: Date of Service: Sara Graves 11/07/2020 1:15 PM Medical Record Number: [id number] Patient Account Number: [id number] Date of Birth/Sex: Treating RN: [birth year] (79 y.o. Sara Graves Primary Care Urvi Imes: Roe Coombs Other Clinician: Referring Marquavious Nazar: Treating Jeanne Terrance/Extender: August Luz Weeks in Treatment: 0 Allergies Active Allergies morphine Reaction: hypotension Type: Food rosiglitazone maleate Reaction: anaphylaxis Type: Food cephalexin Reaction: diarrhea Type: Food Sulfa (Sulfonamide Antibiotics) Type: Allergen tramadol Reaction: unknown Type: Food Elavil Reaction: nausea Type: Medication Ultracet Reaction: rash Type: Medication Allergy Notes Electronic Signature(s) Signed: 11/07/2020 5:18:14 PM By: Deon Pilling Entered By: Deon Pilling on 11/07/2020 13:31:24 -------------------------------------------------------------------------------- Arrival Information Details Patient Name: Date of Service: Sara Graves, Sara NCY B. 11/07/2020 1:15 PM Medical Record Number: [id number] Patient Account Number: [id number] Date of Birth/Sex: Treating RN: [birth year] (79 y.o. Sara Graves, Sara Graves Primary Care Onna Nodal: Roe Coombs Other Clinician: Referring Teea Ducey: Treating Garry Nicolini/Extender: Adolphus Birchwood in Treatment: 0 Visit Information Patient Arrived: Charlyn Minerva Time: 13:20 Accompanied By: self Transfer Assistance: None Patient Identification Verified: Yes Secondary Verification Process Completed: Yes Patient Requires Transmission-Based Precautions: No Patient Has Alerts: Yes Patient Alerts: Patient on Blood Thinner Electronic Signature(s) Signed: 11/07/2020 5:18:14 PM By: Deon Pilling Entered By: Deon Pilling on 11/07/2020  13:28:06 -------------------------------------------------------------------------------- Clinic Level of Care Assessment Details Patient Name: Date of Service: Sara Nash Mantis B. 11/07/2020 1:15 PM Medical Record Number: [id number] Patient Account Number: [id number] Date of Birth/Sex: Treating RN: March 06, 1941 (79 y.o. Sara Graves Primary Care Gibran Veselka: Roe Coombs Other Clinician: Referring Authur Cubit: Treating Welby Montminy/Extender: Adolphus Birchwood in Treatment: 0 Clinic Level of Care Assessment Items TOOL 1 Quantity Score X- 1 0 Use when EandM and Procedure is performed on INITIAL visit ASSESSMENTS - Nursing Assessment / Reassessment X- 1 20 General Physical Exam (combine w/ comprehensive assessment (listed just below) when performed on new pt. evals) X- 1 25 Comprehensive Assessment (HX, ROS, Risk Assessments, Wounds Hx, etc.) ASSESSMENTS - Wound and Skin Assessment / Reassessment []  - 0 Dermatologic / Skin Assessment (not related to wound area) ASSESSMENTS - Ostomy and/or Continence Assessment and Care []  - 0 Incontinence Assessment and Management []  - 0 Ostomy Care Assessment and Management (repouching, etc.) PROCESS - Coordination of Care X - Simple Patient / Family Education for ongoing care 1 15 []  - 0 Complex (extensive) Patient / Family Education for ongoing care X- 1 10 Staff obtains Programmer, systems, Records, T Results / Process Orders est []  - 0 Staff telephones HHA, Nursing Homes / Clarify orders / etc []  - 0 Routine Transfer to another Facility (non-emergent condition) []  - 0 Routine Hospital Admission (non-emergent condition) X- 1 15 New Admissions / Biomedical engineer / Ordering NPWT Apligraf, etc. , []  - 0 Emergency Hospital Admission (emergent condition) PROCESS - Special Needs []  - 0 Pediatric / Minor Patient Management []  - 0 Isolation Patient Management []  - 0 Hearing / Language / Visual special needs []  - 0 Assessment  of Community assistance (transportation, D/C planning, etc.) []  - 0 Additional assistance / Altered mentation []  - 0 Support Surface(s) Assessment (bed, cushion, seat, etc.) INTERVENTIONS - Miscellaneous []  - 0 External ear exam []  - 0 Patient Transfer (multiple staff / Civil Service fast streamer / Similar devices) []  - 0 Simple Staple / Suture removal (25 or less) []  - 0 Complex Staple / Suture removal (26 or more) []  - 0 Hypo/Hyperglycemic Management (do not  check if billed separately) X- 1 15 Ankle / Brachial Index (ABI) - do not check if billed separately Has the patient been seen at the hospital within the last three years: Yes Total Score: 100 Level Of Care: New/Established - Level 3 Electronic Signature(s) Signed: 11/07/2020 7:00:57 PM By: Levan Hurst RN, BSN Entered By: Levan Hurst on 11/07/2020 17:52:41 -------------------------------------------------------------------------------- Compression Therapy Details Patient Name: Date of Service: Sara Graves, Sara NCY B. 11/07/2020 1:15 PM Medical Record Number: [id number] Patient Account Number: [id number] Date of Birth/Sex: Treating RN: 01/20/1941 (79 y.o. Sara Graves Primary Care Eren Ryser: Roe Coombs Other Clinician: Referring Toneisha Savary: Treating Mega Kinkade/Extender: Adolphus Birchwood in Treatment: 0 Compression Therapy Performed for Wound Assessment: Wound #1 Right,Anterior Lower Leg Performed By: Clinician Levan Hurst, RN Compression Type: Rolena Infante Post Procedure Diagnosis Same as Pre-procedure Electronic Signature(s) Signed: 11/07/2020 7:00:57 PM By: Levan Hurst RN, BSN Entered By: Levan Hurst on 11/07/2020 14:56:55 -------------------------------------------------------------------------------- Compression Therapy Details Patient Name: Date of Service: Sara Graves, Sara NCY B. 11/07/2020 1:15 PM Medical Record Number: [id number] Patient Account Number: [id number] Date of Birth/Sex: Treating  RN: September 14, 1941 (79 y.o. Sara Graves Primary Care Mammie Meras: Roe Coombs Other Clinician: Referring Kassius Battiste: Treating Ojas Coone/Extender: Adolphus Birchwood in Treatment: 0 Compression Therapy Performed for Wound Assessment: Wound #2 Left,Anterior Lower Leg Performed By: Clinician Levan Hurst, RN Compression Type: Rolena Infante Post Procedure Diagnosis Same as Pre-procedure Electronic Signature(s) Signed: 11/07/2020 7:00:57 PM By: Levan Hurst RN, BSN Entered By: Levan Hurst on 11/07/2020 14:56:56 -------------------------------------------------------------------------------- Compression Therapy Details Patient Name: Date of Service: Sara Graves, Sara NCY B. 11/07/2020 1:15 PM Medical Record Number: [id number] Patient Account Number: [id number] Date of Birth/Sex: Treating RN: 05-18-1941 (79 y.o. Sara Graves Primary Care Kennedi Lizardo: Roe Coombs Other Clinician: Referring Valda Christenson: Treating Ajooni Karam/Extender: Adolphus Birchwood in Treatment: 0 Compression Therapy Performed for Wound Assessment: Wound #3 Right,Medial Lower Leg Performed By: Clinician Levan Hurst, RN Compression Type: Rolena Infante Post Procedure Diagnosis Same as Pre-procedure Electronic Signature(s) Signed: 11/07/2020 7:00:57 PM By: Levan Hurst RN, BSN Entered By: Levan Hurst on 11/07/2020 14:56:56 -------------------------------------------------------------------------------- Encounter Discharge Information Details Patient Name: Date of Service: Sara Graves, [place] 11/07/2020 1:15 PM Medical Record Number: [id number] Patient Account Number: [id number] Date of Birth/Sex: Treating RN: [birth year] (79 y.o. Sara Graves Primary Care Kalijah Zeiss: Roe Coombs Other Clinician: Referring Ulrich Soules: Treating Ormand Senn/Extender: Adolphus Birchwood in Treatment: 0 Encounter Discharge Information Items Discharge Condition: Stable Ambulatory  Status: Walker Discharge Destination: Home Transportation: Private Auto Accompanied By: self Schedule Follow-up Appointment: Yes Clinical Summary of Care: Patient Declined Electronic Signature(s) Signed: 11/07/2020 5:14:06 PM By: Carlene Coria RN Entered By: Carlene Coria on 11/07/2020 15:20:32 -------------------------------------------------------------------------------- Lower Extremity Assessment Details Patient Name: Date of Service: Sara Nash Mantis B. 11/07/2020 1:15 PM Medical Record Number: [id number] Patient Account Number: [id number] Date of Birth/Sex: Treating RN: 09-19-1941 (79 y.o. Sara Graves, Sara Graves Primary Care Safire Gordin: Roe Coombs Other Clinician: Referring Lathan Gieselman: Treating Jerik Falletta/Extender: August Luz Weeks in Treatment: 0 Edema Assessment Assessed: Shirlyn Goltz: Yes] Patrice Paradise: Yes] Edema: [Left: Yes] [Right: Yes] Calf Left: Right: Point of Measurement: 31 cm From Medial Instep 42 cm 42.5 cm Ankle Left: Right: Point of Measurement: 9 cm From Medial Instep 23 cm 24 cm Knee To Floor Left: Right: From Medial Instep 43 cm 43 cm Vascular Assessment Pulses: Dorsalis Pedis Palpable: [Left:Yes] [Right:Yes] Doppler Audible: [Left:Yes] [Right:Yes] Posterior Tibial Palpable: [Left:Yes] [Right:Yes] Doppler Audible: [Left:Yes] [  Right:Yes] Blood Pressure: Brachial: [Left:151] [Right:151] Ankle: [Left:Dorsalis Pedis: 150 0.99] Notes right ABI noncompressible >284mmHg. Electronic Signature(s) Signed: 11/07/2020 5:18:14 PM By: Deon Pilling Entered By: Deon Pilling on 11/07/2020 13:55:12 -------------------------------------------------------------------------------- Multi-Disciplinary Care Plan Details Patient Name: Date of Service: Sara Graves, Sara NCY B. 11/07/2020 1:15 PM Medical Record Number: [id number] Patient Account Number: [id number] Date of Birth/Sex: Treating RN: 08/22/1941 (79 y.o. Anabell Graves Primary Care Alana Dayton: Roe Coombs Other Clinician: Referring Ajai Terhaar: Treating Marillyn Goren/Extender: Adolphus Birchwood in Treatment: 0 Active Inactive Nutrition Nursing Diagnoses: Impaired glucose control: actual or potential Potential for alteratiion in Nutrition/Potential for imbalanced nutrition Goals: Patient/caregiver agrees to and verbalizes understanding of need to use nutritional supplements and/or vitamins as prescribed Date Initiated: 11/07/2020 Target Resolution Date: 12/07/2020 Goal Status: Active Patient/caregiver will maintain therapeutic glucose control Date Initiated: 11/07/2020 Target Resolution Date: 12/07/2020 Goal Status: Active Interventions: Assess HgA1c results as ordered upon admission and as needed Assess patient nutrition upon admission and as needed per policy Provide education on elevated blood sugars and impact on wound healing Provide education on nutrition Notes: Venous Leg Ulcer Nursing Diagnoses: Knowledge deficit related to disease process and management Goals: Patient will maintain optimal edema control Date Initiated: 11/07/2020 Target Resolution Date: 12/07/2020 Goal Status: Active Patient/caregiver will verbalize understanding of disease process and disease management Date Initiated: 11/07/2020 Target Resolution Date: 12/07/2020 Goal Status: Active Interventions: Assess peripheral edema status every visit. Compression as ordered Provide education on venous insufficiency Notes: Wound/Skin Impairment Nursing Diagnoses: Impaired tissue integrity Goals: Patient/caregiver will verbalize understanding of skin care regimen Date Initiated: 11/07/2020 Target Resolution Date: 12/07/2020 Goal Status: Active Ulcer/skin breakdown will have a volume reduction of 30% by week 4 Date Initiated: 11/07/2020 Target Resolution Date: 12/07/2020 Goal Status: Active Interventions: Assess patient/caregiver ability to obtain necessary supplies Assess  patient/caregiver ability to perform ulcer/skin care regimen upon admission and as needed Assess ulceration(s) every visit Provide education on ulcer and skin care Notes: Electronic Signature(s) Signed: 11/07/2020 7:00:57 PM By: Levan Hurst RN, BSN Entered By: Levan Hurst on 11/07/2020 14:55:29 -------------------------------------------------------------------------------- Pain Assessment Details Patient Name: Date of Service: Sara Graves, Sara NCY B. 11/07/2020 1:15 PM Medical Record Number: [id number] Patient Account Number: [id number] Date of Birth/Sex: Treating RN: 1941-01-09 (79 y.o. Sara Graves Primary Care Dickie Cloe: Roe Coombs Other Clinician: Referring Albion Weatherholtz: Treating Avigail Pilling/Extender: Adolphus Birchwood in Treatment: 0 Active Problems Location of Pain Severity and Description of Pain Patient Has Paino Yes Site Locations Pain Location: Pain Location: Pain in Ulcers Rate the pain. Current Pain Level: 8 Worst Pain Level: 10 Least Pain Level: 0 Tolerable Pain Level: 9 Pain Management and Medication Current Pain Management: Medication: No Cold Application: No Rest: No Massage: No Activity: No T.E.N.S.: No Heat Application: No Leg drop or elevation: No Is the Current Pain Management Adequate: Adequate How does your wound impact your activities of daily livingo Sleep: No Bathing: No Appetite: No Relationship With Others: No Bladder Continence: No Emotions: No Bowel Continence: No Work: No Toileting: No Drive: No Dressing: No Hobbies: No Electronic Signature(s) Signed: 11/07/2020 5:18:14 PM By: Deon Pilling Entered By: Deon Pilling on 11/07/2020 14:00:29 -------------------------------------------------------------------------------- Patient/Caregiver Education Details Patient Name: Date of Service: Sara Graves, Sara Eugenio Hoes 12/22/2021andnbsp1:15 PM Medical Record Number: [id number] Patient Account Number: [id number] Date of  Birth/Gender: Treating RN: 29-Jun-1941 (79 y.o. Sara Graves Primary Care Physician: Roe Coombs Other Clinician: Referring Physician: Treating Physician/Extender: Adolphus Birchwood in Treatment: 0 Education Assessment  Education Provided To: Patient Education Topics Provided Elevated Blood Sugar/ Impact on Healing: Methods: Explain/Verbal Responses: State content correctly Nutrition: Methods: Explain/Verbal Responses: State content correctly Venous: Methods: Explain/Verbal Responses: State content correctly Wound/Skin Impairment: Methods: Explain/Verbal Responses: State content correctly Electronic Signature(s) Signed: 11/07/2020 7:00:57 PM By: Levan Hurst RN, BSN Entered By: Levan Hurst on 11/07/2020 14:56:19 -------------------------------------------------------------------------------- Wound Assessment Details Patient Name: Date of Service: Sara Graves, Sara NCY B. 11/07/2020 1:15 PM Medical Record Number: [id number] Patient Account Number: [id number] Date of Birth/Sex: Treating RN: 1941/02/09 (79 y.o. 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 Etiology: Date Acquired: 10/26/2020 Wound Open Weeks Of Treatment: 0 Status: Clustered Wound: No Comorbid Glaucoma, Congestive Heart Failure, Coronary Artery Disease, History: Hypertension, Myocardial Infarction, Peripheral Venous Disease, Type II Diabetes, Neuropathy Wound Measurements Length: (cm) 0.9 Width: (cm) 1.8 Depth: (cm) 0.1 Area: (cm) 1.272 Volume: (cm) 0.127 % Reduction in Area: % Reduction in Volume: Epithelialization: Small (1-33%) Tunneling: No Wound  Description Classification: Full Thickness Without Exposed Support Structures Wound Margin: Distinct, outline attached Exudate Amount: Medium Exudate Type: Serosanguineous Exudate Color: red, brown Foul Odor After Cleansing: No Slough/Fibrino Yes Wound Bed Granulation Amount: Large (67-100%) Exposed Structure Granulation Quality: Red, Pink Fascia Exposed: No Necrotic Amount: Small (1-33%) Fat Layer (Subcutaneous Tissue) Exposed: Yes Necrotic Quality: Adherent Slough Tendon Exposed: No Muscle Exposed: No Joint Exposed: No Bone Exposed: No Treatment Notes Wound #1 (Lower Leg) Wound Laterality: Right, Anterior Cleanser Wound Cleanser Discharge Instruction: Cleanse the wound with wound cleanser prior to applying a clean dressing using gauze sponges, not tissue or cotton balls. Peri-Wound Care Sween Lotion (Moisturizing lotion) Discharge Instruction: Apply moisturizing lotion as directed Topical Primary Dressing KerraCel Ag Gelling Fiber Dressing, 4x5 in (silver alginate) Discharge Instruction: Apply silver alginate to wound bed as instructed Secondary Dressing Woven Gauze Sponge, Non-Sterile 4x4 in Discharge Instruction: Apply over primary dressing as directed. ABD Pad, 8x10 Discharge Instruction: Apply over primary dressing as directed. Secured With Compression Wrap Unnaboot w/Calamine, 4x10 (in/yd) Discharge Instruction: Apply Unnaboot as directed. Compression Stockings Add-Ons Electronic Signature(s) Signed: 11/07/2020 5:18:14 PM By: Deon Pilling Entered By: Deon Pilling on 11/07/2020 13:58:17 -------------------------------------------------------------------------------- Wound Assessment Details Patient Name: Date of Service: Sara Graves, Sara NCY B. 11/07/2020 1:15 PM Medical Record Number: [id number] Patient Account Number: [id number] Date of Birth/Sex: Treating RN: April 17, 1941 (79 y.o. 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 Etiology: Date Acquired: 10/26/2020 Wound Open Weeks Of Treatment: 0 Status: Clustered Wound: No Comorbid Glaucoma, Congestive Heart Failure, Coronary Artery Disease, History: Hypertension, Myocardial Infarction, Peripheral Venous Disease, Type II Diabetes, Neuropathy Wound Measurements Length: (cm) 4 Width: (cm) 4.4 Depth: (cm) 0.1 Area: (cm) 13.823 Volume: (cm) 1.382 % Reduction in Area: % Reduction in Volume: Epithelialization: Small (1-33%) Tunneling: No Undermining: No Wound Description Classification: Full Thickness Without Exposed Support Structures Wound Margin: Distinct, outline attached Exudate Amount: Medium Exudate Type: Serosanguineous Exudate Color: red, brown Foul Odor After Cleansing: No Slough/Fibrino Yes Wound Bed Granulation Amount: Large (67-100%) Exposed Structure Granulation Quality: Red Fascia Exposed: No  Necrotic Amount: Small (1-33%) Fat Layer (Subcutaneous Tissue) Exposed: Yes Necrotic Quality: Adherent Slough Tendon Exposed: No Muscle Exposed: No Joint Exposed: No Bone Exposed: No Treatment Notes Wound #2 (Lower Leg) Wound Laterality: Left, Anterior Cleanser Wound Cleanser Discharge Instruction: Cleanse the wound with wound cleanser prior to applying a clean dressing using gauze sponges, not tissue or cotton balls. Peri-Wound Care Sween Lotion (Moisturizing lotion) Discharge Instruction: Apply moisturizing lotion as directed Topical Primary Dressing KerraCel Ag Gelling Fiber Dressing, 4x5 in (silver alginate) Discharge Instruction: Apply silver alginate to wound bed as instructed Secondary Dressing Woven Gauze Sponge, Non-Sterile 4x4  in Discharge Instruction: Apply over primary dressing as directed. ABD Pad, 8x10 Discharge Instruction: Apply over primary dressing as directed. Secured With Compression Wrap Unnaboot w/Calamine, 4x10 (in/yd) Discharge Instruction: Apply Unnaboot as directed. Compression Stockings Add-Ons Electronic Signature(s) Signed: 11/07/2020 5:18:14 PM By: Deon Pilling Entered By: Deon Pilling on 11/07/2020 13:59:15 -------------------------------------------------------------------------------- Wound Assessment Details Patient Name: Date of Service: Sara Graves, Sara NCY B. 11/07/2020 1:15 PM Medical Record Number: [id number] Patient Account Number: [id number] Date of Birth/Sex: Treating RN: 01-26-1941 (79 y.o. 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 Etiology: Date Acquired: 10/26/2020 Wound Open Weeks Of Treatment: 0 Status: Clustered Wound: Yes Comorbid Glaucoma, Congestive Heart Failure, Coronary Artery Disease, History: Hypertension, Myocardial Infarction, Peripheral Venous Disease, Type II Diabetes, Neuropathy Wound Measurements Length: (cm) 3.8 Width: (cm) 2.5 Depth: (cm) 0.2 Clustered Quantity: 2 Area: (cm) 7.46 Volume: (cm) 1.49 Wound Description Classification: Full Thickness Without Exposed Support Struct Wound Margin: Distinct, outline attached Exudate Amount: Medium Exudate Type: Serosanguineous Exudate Color: red, brown Foul Odor After Cleansing: Slough/Fibrino % Reduction in Area: % Reduction in Volume: Epithelialization: Small (1-33%) Tunneling: No 1 Undermining: No 2 ures No Yes Wound Bed Granulation Amount: Large (67-100%) Exposed  Structure Granulation Quality: Red, Pink Fascia Exposed: No Necrotic Amount: Small (1-33%) Fat Layer (Subcutaneous Tissue) Exposed: Yes Necrotic Quality: Adherent Slough Tendon Exposed: No Muscle Exposed: No Joint Exposed: No Bone Exposed: No Treatment Notes Wound #3 (Lower Leg) Wound Laterality: Right, Medial Cleanser Wound Cleanser Discharge Instruction: Cleanse the wound with wound cleanser prior to applying a clean dressing using gauze sponges, not tissue or cotton balls. Peri-Wound Care Sween Lotion (Moisturizing lotion) Discharge Instruction: Apply moisturizing lotion as directed Topical Primary Dressing KerraCel Ag Gelling Fiber Dressing, 4x5 in (silver alginate) Discharge Instruction: Apply silver alginate to wound bed as instructed Secondary Dressing Woven Gauze Sponge, Non-Sterile 4x4 in Discharge Instruction: Apply over primary dressing as directed. ABD Pad, 8x10 Discharge Instruction: Apply over primary dressing as directed. Secured With Compression Wrap Unnaboot w/Calamine, 4x10 (in/yd) Discharge Instruction: Apply Unnaboot as directed. Compression Stockings Add-Ons Electronic Signature(s) Signed: 11/07/2020 5:18:14 PM By: Deon Pilling Entered By: Deon Pilling on 11/07/2020 14:00:10 -------------------------------------------------------------------------------- Vitals Details Patient Name: Date of Service: Sara Graves, Sara NCY B. 11/07/2020 1:15 PM Medical Record Number: [id number] Patient Account Number: [id number] Date of Birth/Sex: Treating RN: 1941/07/31 (79 y.o. Sara Graves, Sara Graves Primary Care Aris Even: Roe Coombs Other Clinician: Referring Davier Tramell: Treating Kya Mayfield/Extender: Adolphus Birchwood in Treatment: 0 Vital Signs Time Taken: 13:21 Temperature (F): 97.5 Height (in): 63 Pulse (bpm): 92 Source: Stated Respiratory Rate (breaths/min): 20 Weight (lbs): 167 Blood Pressure (mmHg): 151/80 Source: Stated Capillary Blood  Glucose (mg/dl): 130 Body Mass Index (BMI): 29.6 Reference Range: 80 - 120 mg / dl Electronic Signature(s) Signed: 11/07/2020 5:18:14 PM By: Deon Pilling Entered By: Deon Pilling on 11/07/2020 13:32:22

## 2020-11-07 NOTE — Progress Notes (Signed)
Sara Graves ([id number]) , Visit Report for 11/07/2020 Abuse/Suicide Risk Screen Details Patient Name: Date of Service: Sara Graves 11/07/2020 1:15 PM Medical Record Number: [id number] Patient Account Number: [id number] Date of Birth/Sex: Treating RN: 05-30-1941 (79 y.o. Helene Shoe, Tammi Klippel Primary Care Leani Myron: Roe Coombs Other Clinician: Referring Jadwiga Faidley: Treating Skyeler Smola/Extender: Adolphus Birchwood in Treatment: 0 Abuse/Suicide Risk Screen Items Answer ABUSE RISK SCREEN: Has anyone close to you tried to hurt or harm you recentlyo No Do you feel uncomfortable with anyone in your familyo No Has anyone forced you do things that you didnt want to doo No Electronic Signature(s) Signed: 11/07/2020 5:18:14 PM By: Deon Pilling Entered By: Deon Pilling on 11/07/2020 13:32:28 -------------------------------------------------------------------------------- Activities of Daily Living Details Patient Name: Date of Service: Sara Nash Mantis B. 11/07/2020 1:15 PM Medical Record Number: [id number] Patient Account Number: [id number] Date of Birth/Sex: Treating RN: 1941-07-06 (79 y.o. Helene Shoe, Tammi Klippel Primary Care Lella Mullany: Roe Coombs Other Clinician: Referring Arty Lantzy: Treating Graig Hessling/Extender: Adolphus Birchwood in Treatment: 0 Activities of Daily Living Items Answer Activities of Daily Living (Please select one for each item) Drive Automobile Not Able T Medications ake Completely Able Use T elephone Completely Able Care for Appearance Completely Able Use T oilet Completely Able Bath / Shower Completely Able Dress Self Completely Able Feed Self Completely Able Walk Need Assistance Get In / Out Bed Completely Able Housework Completely Able Prepare Meals Completely [place] for Self Completely Able Electronic Signature(s) Signed: 11/07/2020 5:18:14 PM By: Deon Pilling Entered By: Deon Pilling on 11/07/2020 13:32:51 -------------------------------------------------------------------------------- Education Screening Details Patient Name: Date of Service: Sara Graves, Sara NCY B. 11/07/2020 1:15 PM Medical Record Number: [id number] Patient Account Number: [id number] Date of Birth/Sex: Treating RN: 1941/06/04 (79 y.o. Helene Shoe, Tammi Klippel Primary Care Velta Rockholt: Roe Coombs Other Clinician: Referring Zanya Lindo: Treating Farida Mcreynolds/Extender: Adolphus Birchwood in Treatment: 0 Primary Learner Assessed: Patient Learning Preferences/Education Level/Primary Language Learning Preference: Explanation, Demonstration, Printed Material Highest Education Level: High School Preferred Language: English Cognitive Barrier Language Barrier: No Translator Needed: No Memory Deficit: No Emotional Barrier: No Cultural/Religious Beliefs Affecting Medical Care: No Physical Barrier Impaired Vision: Yes Glasses Impaired Hearing: No Decreased Hand dexterity: No Knowledge/Comprehension Knowledge Level: High Comprehension Level: High Ability to understand written instructions: High Ability to understand verbal instructions: High Motivation Anxiety Level: Calm Cooperation: Cooperative Education Importance: Acknowledges Need Interest in Health Problems: Asks Questions Perception: Coherent Willingness to Engage in Self-Management High Activities: Readiness to Engage in Self-Management High Activities: Electronic Signature(s) Signed: 11/07/2020 5:18:14 PM By: Deon Pilling Entered By: Deon Pilling on 11/07/2020 13:33:15 -------------------------------------------------------------------------------- Fall Risk Assessment Details Patient Name: Date of Service: Sara Graves, Sara NCY B. 11/07/2020 1:15 PM Medical Record Number: [id number] Patient Account Number: [id number] Date of Birth/Sex: Treating RN: 07/16/1941 (79 y.o. Helene Shoe, Tammi Klippel Primary Care Marletta Bousquet: Roe Coombs Other Clinician: Referring Dak Szumski: Treating Glen Kesinger/Extender: Adolphus Birchwood in Treatment: 0 Fall Risk Assessment Items Have you had 2 or more falls in the last 12 monthso 0 Yes Have you had any fall that resulted in injury in the last 12 monthso 0 No FALLS RISK SCREEN History of falling - immediate or within 3 months 25 Yes Secondary diagnosis (Do you have 2 or more medical diagnoseso) 0 No Ambulatory aid None/bed rest/wheelchair/nurse 0 No Crutches/cane/walker 15 Yes Furniture 0 No Intravenous therapy Access/Saline/Heparin Lock 0 No Gait/Transferring Normal/ bed rest/ wheelchair 0 Yes  Weak (short steps with or without shuffle, stooped but able to lift head while walking, may seek 0 No support from furniture) Impaired (short steps with shuffle, may have difficulty arising from chair, head down, impaired 0 No balance) Mental Status Oriented to own ability 0 Yes Electronic Signature(s) Signed: 11/07/2020 5:18:14 PM By: Deon Pilling Entered By: Deon Pilling on 11/07/2020 13:35:06 -------------------------------------------------------------------------------- Foot Assessment Details Patient Name: Date of Service: Sara Graves, Sara NCY B. 11/07/2020 1:15 PM Medical Record Number: [id number] Patient Account Number: [id number] Date of Birth/Sex: Treating RN: 1941/08/17 (79 y.o. Debby Bud Primary Care Jaionna Weisse: Roe Coombs Other Clinician: Referring Gaetano Romberger: Treating Baldo Hufnagle/Extender: Adolphus Birchwood in Treatment: 0 Foot Assessment Items Site Locations + = Sensation present, - = Sensation absent, C = Callus, U = Ulcer R = Redness, W = Warmth, M = Maceration, PU = Pre-ulcerative lesion F = Fissure, S = Swelling, D = Dryness Assessment Right: Left: Other Deformity: No No Prior Foot Ulcer: No No Prior Amputation: No No Charcot Joint: No No Ambulatory Status: Ambulatory With Help Assistance Device: Walker Gait:  Steady Electronic Signature(s) Signed: 11/07/2020 5:18:14 PM By: Deon Pilling Entered By: Deon Pilling on 11/07/2020 13:49:09 -------------------------------------------------------------------------------- Nutrition Risk Screening Details Patient Name: Date of Service: Sara Nash Mantis B. 11/07/2020 1:15 PM Medical Record Number: [id number] Patient Account Number: [id number] Date of Birth/Sex: Treating RN: Mar 27, 1941 (79 y.o. Helene Shoe, Meta.Reding Primary Care Braedin Millhouse: Roe Coombs Other Clinician: Referring Annika Selke: Treating Duvall Comes/Extender: Adolphus Birchwood in Treatment: 0 Height (in): 63 Weight (lbs): 167 Body Mass Index (BMI): 29.6 Nutrition Risk Screening Items Score Screening NUTRITION RISK SCREEN: I have an illness or condition that made me change the kind and/or amount of food I eat 2 Yes I eat fewer than two meals per day 0 No I eat few fruits and vegetables, or milk products 0 No I have three or more drinks of beer, liquor or wine almost every day 0 No I have tooth or mouth problems that make it hard for me to eat 0 No I don't always have enough money to buy the food I need 0 No I eat alone most of the time 0 No I take three or more different prescribed or over-the-counter drugs a day 1 Yes Without wanting to, I have lost or gained 10 pounds in the last six months 0 No I am not always physically able to shop, cook and/or feed myself 0 No Nutrition Protocols Good Risk Protocol Provide education on elevated blood Moderate Risk Protocol 0 sugars and impact on wound healing, as applicable High Risk Proctocol Risk Level: Moderate Risk Score: 3 Electronic Signature(s) Signed: 11/07/2020 5:18:14 PM By: Deon Pilling Entered By: Deon Pilling on 11/07/2020 13:34:04

## 2020-11-08 ENCOUNTER — Other Ambulatory Visit: Payer: Self-pay | Admitting: Cardiology

## 2020-11-08 NOTE — Telephone Encounter (Signed)
Called and spoke to patient. She will go get labs done in 1 week.

## 2020-11-08 NOTE — Progress Notes (Signed)
Sara Graves ([id number]) , Visit Report for 11/07/2020 Chief Complaint Document Details Patient Name: Date of Service: Sara Graves 11/07/2020 1:15 PM Medical Record Number: [id number] Patient Account Number: [id number] Date of Birth/Sex: Treating RN: Jun 04, 1941 (79 y.o. Sara Graves Primary Care Provider: Roe Coombs Other Clinician: Referring Provider: Treating Provider/Extender: Adolphus Birchwood in Treatment: 0 Information Obtained from: Patient Chief Complaint Bilateral LE Ulcers Electronic Signature(s) Signed: 11/07/2020 2:46:05 PM By: Worthy Keeler PA-C Entered By: Worthy Keeler on 11/07/2020 14:46:05 -------------------------------------------------------------------------------- HPI Details Patient Name: Date of Service: Sara Graves, Sara Sara B. 11/07/2020 1:15 PM Medical Record Number: [id number] Patient Account Number: [id number] Date of Birth/Sex: Treating RN: [birth year] (79 y.o. [name] Primary Care Provider: Roe Coombs Other Clinician: Referring Provider: Treating Provider/Extender: Adolphus Birchwood in Treatment: 0 History of Present Illness HPI Description: 11/07/2020 upon evaluation today patient presents for evaluation here in our clinic concerning issues she has been having with swelling and wounds on the bilateral lower extremities which began about 1 month ago roughly. She tells me she has been using mupirocin over the area ischial also placed on clindamycin by the emergency department when she went. Unfortunately her son has just been admitted to the hospital at [facility] in [place] and she is somewhat distraught about that I can tell as she talks about it she begins to weep. With that being said I understand she is under a lot of stress and this is probably not helping things out at all either. With that being said I do believe that the clindamycin is probably good she tells me things do  seem to be doing a little bit better since she started clindamycin this is good news. No fevers, chills, nausea, vomiting, or diarrhea. She does have a significant history of venous insufficiency, peripheral vascular disease, diabetes mellitus type 2, hypertension, congestive heart failure, and she is dependent on supplemental oxygen most of the time. Electronic Signature(s) Signed: 11/07/2020 3:00:08 PM By: Worthy Keeler PA-C Entered By: Worthy Keeler on 11/07/2020 15:00:08 -------------------------------------------------------------------------------- Physical Exam Details Patient Name: Date of Service: Sara Graves, Sara Sara B. 11/07/2020 1:15 PM Medical Record Number: [id number] Patient Account Number: [id number] Date of Birth/Sex: Treating RN: 01-20-1941 (79 y.o. Sara Graves Primary Care Provider: Other Clinician: Roe Coombs Referring Provider: Treating Provider/Extender: Adolphus Birchwood in Treatment: 0 Constitutional patient is hypertensive.. pulse regular and within target range for patient.Marland Kitchen respirations regular, non-labored and within target range for patient.Marland Kitchen temperature within target range for patient.. Well-nourished and well-hydrated in no acute distress. Eyes conjunctiva clear no eyelid edema noted. pupils equal round and reactive to light and accommodation. Ears, Nose, Mouth, and Throat no gross abnormality of ear auricles or external auditory canals. normal hearing noted during conversation. mucus membranes moist. Respiratory normal breathing without difficulty. Cardiovascular 1+ dorsalis pedis/posterior tibialis pulses. 3+ pitting edema of the bilateral lower extremities. Musculoskeletal normal gait and posture. no significant deformity or arthritic changes, no loss or range of motion, no clubbing. Psychiatric this patient is able to make decisions and demonstrates good insight into disease process. Alert and Oriented x 3. pleasant and  cooperative. Notes Upon inspection patient's wounds currently showed signs of not being too bad nor too deep. I do feel like that there is evidence that there was greater erythema which is actually probably improved over the time since she has been taking the antibiotics, clindamycin. With that being  said there does not appear to be any systemic infection currently and overall the wounds are quite shallow which is excellent news but she does have a significant amount of lower extremity edema left slightly worse than right but nonetheless there is 2-3+ pitting edema noted bilaterally. I think we definitely need to work on this for cardiologist did increase her fluid pills she has been urinating much more frequent to try to help clear this out. Electronic Signature(s) Signed: 11/07/2020 3:00:54 PM By: Worthy Keeler PA-C Entered By: Worthy Keeler on 11/07/2020 15:00:53 -------------------------------------------------------------------------------- Physician Orders Details Patient Name: Date of Service: Sara Graves, Sara Sara B. 11/07/2020 1:15 PM Medical Record Number: [id number] Patient Account Number: [id number] Date of Birth/Sex: Treating RN: 26-Feb-1941 (79 y.o. [name] Primary Care Provider: Roe Coombs Other Clinician: Referring Provider: Treating Provider/Extender: Adolphus Birchwood in Treatment: 0 Verbal / Phone Orders: No Diagnosis Coding ICD-10 Coding Code Description I87.2 Venous insufficiency (chronic) (peripheral) E11.622 Type 2 diabetes mellitus with other skin ulcer L97.812 Non-pressure chronic ulcer of other part of right lower leg with fat layer exposed L97.822 Non-pressure chronic ulcer of other part of left lower leg with fat layer exposed I10 Essential (primary) hypertension I50.42 Chronic combined systolic (congestive) and diastolic (congestive) heart failure Z99.81 Dependence on supplemental oxygen Follow-up Appointments ppointment in 2  weeks. - MD visit Return A Nurse Visit: - 1 week for rewrap Bathing/ Shower/ Hygiene May shower with protection but do not get wound dressing(s) wet. Edema Control - Lymphedema / SCD / Other Bilateral Lower Extremities Elevate legs to the level of the heart or above for 30 minutes daily and/or when sitting, a frequency of: - throughout the day Avoid standing for long periods of time. Exercise regularly Wound Treatment Wound #1 - Lower Leg Wound Laterality: Right, Anterior Cleanser: Wound Cleanser 1 x Per Week Discharge Instructions: Cleanse the wound with wound cleanser prior to applying a clean dressing using gauze sponges, not tissue or cotton balls. Peri-Wound Care: Sween Lotion (Moisturizing lotion) 1 x Per Week Discharge Instructions: Apply moisturizing lotion as directed Prim Dressing: KerraCel Ag Gelling Fiber Dressing, 4x5 in (silver alginate) 1 x Per Week ary Discharge Instructions: Apply silver alginate to wound bed as instructed Secondary Dressing: Woven Gauze Sponge, Non-Sterile 4x4 in 1 x Per Week Discharge Instructions: Apply over primary dressing as directed. Secondary Dressing: ABD Pad, 8x10 1 x Per Week Discharge Instructions: Apply over primary dressing as directed. Compression Wrap: Unnaboot w/Calamine, 4x10 (in/yd) 1 x Per Week Discharge Instructions: Apply Unnaboot as directed. Wound #2 - Lower Leg Wound Laterality: Left, Anterior Cleanser: Wound Cleanser 1 x Per Week Discharge Instructions: Cleanse the wound with wound cleanser prior to applying a clean dressing using gauze sponges, not tissue or cotton balls. Peri-Wound Care: Sween Lotion (Moisturizing lotion) 1 x Per Week Discharge Instructions: Apply moisturizing lotion as directed Prim Dressing: KerraCel Ag Gelling Fiber Dressing, 4x5 in (silver alginate) 1 x Per Week ary Discharge Instructions: Apply silver alginate to wound bed as instructed Secondary Dressing: Woven Gauze Sponge, Non-Sterile 4x4 in 1 x  Per Week Discharge Instructions: Apply over primary dressing as directed. Secondary Dressing: ABD Pad, 8x10 1 x Per Week Discharge Instructions: Apply over primary dressing as directed. Compression Wrap: Unnaboot w/Calamine, 4x10 (in/yd) 1 x Per Week Discharge Instructions: Apply Unnaboot as directed. Wound #3 - Lower Leg Wound Laterality: Right, Medial Cleanser: Wound Cleanser 1 x Per Week Discharge Instructions: Cleanse the wound with wound cleanser prior to  applying a clean dressing using gauze sponges, not tissue or cotton balls. Peri-Wound Care: Sween Lotion (Moisturizing lotion) 1 x Per Week Discharge Instructions: Apply moisturizing lotion as directed Prim Dressing: KerraCel Ag Gelling Fiber Dressing, 4x5 in (silver alginate) 1 x Per Week ary Discharge Instructions: Apply silver alginate to wound bed as instructed Secondary Dressing: Woven Gauze Sponge, Non-Sterile 4x4 in 1 x Per Week Discharge Instructions: Apply over primary dressing as directed. Secondary Dressing: ABD Pad, 8x10 1 x Per Week Discharge Instructions: Apply over primary dressing as directed. Compression Wrap: Unnaboot w/Calamine, 4x10 (in/yd) 1 x Per Week Discharge Instructions: Apply Unnaboot as directed. Services and Therapies rterial Studies- Bilateral with ABIs and TBIs - Non healing ulcers on bilateral lower legs, abnormal arterial study in 2019. - (ICD10 I87.2 - Venous A insufficiency (chronic) (peripheral)) Electronic Signature(s) Signed: 11/07/2020 5:13:59 PM By: Worthy Keeler PA-C Signed: 11/07/2020 7:00:57 PM By: Levan Hurst RN, BSN Entered By: Levan Hurst on 11/07/2020 14:58:29 Prescription 11/07/2020 Shad Joni Reining -------------------------------------------------------------------------------- , Worthy Keeler PA Patient Name: Provider: Jun 29, 1941 [id number] Date of Birth: NPI#[id number] Primus Sex: DEA #: [phone] Phone #: License #: [place] Patient Address: [facility] [address] [place], [place] [address] [place], [place] [address] [phone] Allergies morphine; rosiglitazone maleate; cephalexin; Sulfa (Sulfonamide Antibiotics); tramadol; Elavil; Ultracet Provider's Orders rterial Studies- Bilateral with ABIs and TBIs - ICD10: I87.2 - Non healing ulcers on bilateral lower legs, abnormal arterial study in 2019. A Hand Signature: Date(s): Electronic Signature(s) Signed: 11/07/2020 5:13:59 PM By: Worthy Keeler PA-C Signed: 11/07/2020 7:00:57 PM By: Levan Hurst RN, BSN Entered By: Levan Hurst on 11/07/2020 14:58:31 -------------------------------------------------------------------------------- Problem List Details Patient Name: Date of Service: Sara Graves, Sara Sara B. 11/07/2020 1:15 PM Medical Record Number: [id number] Patient Account Number: [id number] Date of Birth/Sex: Treating RN: 12-10-1940 (79 y.o. Sara Graves Primary Care Provider: Roe Coombs Other Clinician: Referring Provider: Treating Provider/Extender: Adolphus Birchwood in Treatment: 0 Active Problems ICD-10 Encounter Code Description Active Date MDM Diagnosis I87.2 Venous insufficiency (chronic) (peripheral) 11/07/2020 No Yes E11.622 Type 2 diabetes mellitus with other skin ulcer 11/07/2020 No Yes L97.812 Non-pressure chronic ulcer of other part of right lower leg with fat layer 11/07/2020 No Yes exposed L97.822 Non-pressure chronic ulcer of other part of left lower leg with fat layer 11/07/2020 No Yes exposed [place] (primary) hypertension 11/07/2020 No Yes I50.42 Chronic combined systolic (congestive) and diastolic (congestive) heart failure 11/07/2020 No Yes Z99.81 Dependence on supplemental oxygen 11/07/2020 No Yes Inactive Problems Resolved Problems Electronic Signature(s) Signed: 11/07/2020 2:45:51 PM By: Worthy Keeler PA-C Entered By: Worthy Keeler on 11/07/2020  14:45:51 -------------------------------------------------------------------------------- Progress Note Details Patient Name: Date of Service: Sara Graves, Sara Sara B. 11/07/2020 1:15 PM Medical Record Number: [id number] Patient Account Number: [id number] Date of Birth/Sex: Treating RN: [birth year] (79 y.o. Sara Graves Primary Care Provider: Roe Coombs Other Clinician: Referring Provider: Treating Provider/Extender: Adolphus Birchwood in Treatment: 0 Subjective Chief Complaint Information obtained from Patient Bilateral LE Ulcers History of Present Illness (HPI) 11/07/2020 upon evaluation today patient presents for evaluation here in our clinic concerning issues she has been having with swelling and wounds on the bilateral lower extremities which began about 1 month ago roughly. She tells me she has been using mupirocin over the area ischial also placed on clindamycin by the emergency department when she went. Unfortunately her son has just  been admitted to the hospital at [facility] in [place] and she is somewhat distraught about that I can tell as she talks about it she begins to weep. With that being said I understand she is under a lot of stress and this is probably not helping things out at all either. With that being said I do believe that the clindamycin is probably good she tells me things do seem to be doing a little bit better since she started clindamycin this is good news. No fevers, chills, nausea, vomiting, or diarrhea. She does have a significant history of venous insufficiency, peripheral vascular disease, diabetes mellitus type 2, hypertension, congestive heart failure, and she is dependent on supplemental oxygen most of the time. Patient History Information obtained from Patient. Allergies morphine (Reaction: hypotension), rosiglitazone maleate (Reaction: anaphylaxis), cephalexin (Reaction: diarrhea), Sulfa (Sulfonamide Antibiotics),  tramadol (Reaction: unknown), Elavil (Reaction: nausea), Ultracet (Reaction: rash) Family History Cancer - Mother,Father,Child, Heart Disease - Maternal Grandparents,Paternal Grandparents, Hypertension - Paternal Grandparents,Maternal Grandparents, Lung Disease - Child, No family history of Diabetes, Kidney Disease, Seizures, Stroke, Thyroid Problems, Tuberculosis. Social History Never smoker, Marital Status - Married, Alcohol Use - Never, Drug Use - No History, Caffeine Use - Daily - soda. Medical History Eyes Patient has history of Glaucoma - starting Denies history of Cataracts, Optic Neuritis Ear/Nose/Mouth/Throat Denies history of Chronic sinus problems/congestion, Middle ear problems Hematologic/Lymphatic Denies history of Anemia, Hemophilia, Human Immunodeficiency Virus, Lymphedema, Sickle Cell Disease Respiratory Denies history of Aspiration, Asthma, Chronic Obstructive Pulmonary Disease (COPD), Pneumothorax, Sleep Apnea, Tuberculosis Cardiovascular Patient has history of Congestive Heart Failure, Coronary Artery Disease - Stent 6/19,01/2019, Hypertension, Myocardial Infarction - 2019, Peripheral Venous Disease Denies history of Angina, Arrhythmia, Deep Vein Thrombosis, Hypotension, Peripheral Arterial Disease, Phlebitis, Vasculitis Gastrointestinal Denies history of Cirrhosis , Colitis, Crohnoos, Hepatitis A, Hepatitis B, Hepatitis C Endocrine Patient has history of Type II Diabetes Denies history of Type I Diabetes Genitourinary Denies history of End Stage Renal Disease Immunological Denies history of Lupus Erythematosus, Raynaudoos, Scleroderma Integumentary (Skin) Denies history of History of Burn Musculoskeletal Denies history of Gout, Rheumatoid Arthritis, Osteoarthritis, Osteomyelitis Neurologic Patient has history of Neuropathy Denies history of Dementia, Quadriplegia, Paraplegia, Seizure Disorder Oncologic Denies history of Received Chemotherapy, Received  Radiation Psychiatric Denies history of Anorexia/bulimia, Confinement Anxiety Patient is treated with Controlled Diet, Insulin. Blood sugar is tested. Hospitalization/Surgery History - back surgery implant stimulator. - right knee replacement. - x2 right shoulder sx. - x1 left shoulder sx. - MI/defibrillator placement 2019. 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 Medical A Surgical History Notes nd Constitutional Symptoms (General Health) thrombocytopenia thyroid disease Respiratory 02 continuous 2L [employer] [place]. 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 Eyes Complains or has symptoms of Glasses / Contacts - glasses. Denies complaints or symptoms of Dry Eyes, Vision Changes. Ear/Nose/Mouth/Throat Denies complaints or symptoms of Chronic sinus problems or rhinitis. Respiratory Denies complaints or symptoms of Chronic or frequent coughs, Shortness of Breath. Gastrointestinal Denies complaints or symptoms of Frequent diarrhea, Nausea, Vomiting. Endocrine Denies complaints or symptoms of Heat/cold intolerance. Genitourinary Complains or has symptoms of Frequent urination - related to x3 diuretics. Integumentary (Skin) Complains or has symptoms of Wounds - BLE. Musculoskeletal Denies complaints or symptoms of Muscle Pain, Muscle Weakness. Neurologic Denies complaints or symptoms of Numbness/parasthesias. Psychiatric Denies complaints or symptoms of Claustrophobia, Suicidal. Objective Constitutional patient is hypertensive.Marland Kitchen  pulse regular and within target range for patient.Marland Kitchen respirations regular, non-labored and within target range for patient.Marland Kitchen temperature within target range for patient.. Well-nourished and well-hydrated in no acute distress. Vitals Time Taken: 1:21 PM,  Height: 63 in, Source: Stated, Weight: 167 lbs, Source: Stated, BMI: 29.6, Temperature: 97.5 F, Pulse: 92 bpm, Respiratory Rate: 20 breaths/min, Blood Pressure: 151/80 mmHg, Capillary Blood Glucose: 130 mg/dl. Eyes conjunctiva clear no eyelid edema noted. pupils equal round and reactive to light and accommodation. Ears, Nose, Mouth, and Throat no gross abnormality of ear auricles or external auditory canals. normal hearing noted during conversation. mucus membranes moist. Respiratory normal breathing without difficulty. Cardiovascular 1+ dorsalis pedis/posterior tibialis pulses. 3+ pitting edema of the bilateral lower extremities. Musculoskeletal normal gait and posture. no significant deformity or arthritic changes, no loss or range of motion, no clubbing. Psychiatric this patient is able to make decisions and demonstrates good insight into disease process. Alert and Oriented x 3. pleasant and cooperative. General Notes: Upon inspection patient's wounds currently showed signs of not being too bad nor too deep. I do feel like that there is evidence that there was greater erythema which is actually probably improved over the time since she has been taking the antibiotics, clindamycin. With that being said there does not appear to be any systemic infection currently and overall the wounds are quite shallow which is excellent news but she does have a significant amount of lower extremity edema left slightly worse than right but nonetheless there is 2-3+ pitting edema noted bilaterally. I think we definitely need to work on this for cardiologist did increase her fluid pills she has been urinating much more frequent to try to help clear this out. Integumentary (Hair, Skin) Wound #1 status is Open. Original cause of wound was Blister. The wound is located on the Right,Anterior Lower Leg. The wound measures 0.9cm length x 1.8cm width x 0.1cm depth; 1.272cm^2 area and 0.127cm^3 volume. There is Fat  Layer (Subcutaneous Tissue) exposed. There is no tunneling noted. There is a medium amount of serosanguineous drainage noted. The wound margin is distinct with the outline attached to the wound base. There is large (67-100%) red, pink granulation within the wound bed. There is a small (1-33%) amount of necrotic tissue within the wound bed including Adherent Slough. Wound #2 status is Open. Original cause of wound was Blister. The wound is located on the Left,Anterior Lower Leg. The wound measures 4cm length x 4.4cm width x 0.1cm depth; 13.823cm^2 area and 1.382cm^3 volume. There is Fat Layer (Subcutaneous Tissue) exposed. There is no tunneling or undermining noted. There is a medium amount of serosanguineous drainage noted. The wound margin is distinct with the outline attached to the wound base. There is large (67- 100%) red granulation within the wound bed. There is a small (1-33%) amount of necrotic tissue within the wound bed including Adherent Slough. Wound #3 status is Open. Original cause of wound was Blister. The wound is located on the Right,Medial Lower Leg. The wound measures 3.8cm length x 2.5cm width x 0.2cm depth; 7.461cm^2 area and 1.492cm^3 volume. There is Fat Layer (Subcutaneous Tissue) exposed. There is no tunneling or undermining noted. There is a medium amount of serosanguineous drainage noted. The wound margin is distinct with the outline attached to the wound base. There is large (67-100%) red, pink granulation within the wound bed. There is a small (1-33%) amount of necrotic tissue within the wound bed including Adherent Slough. Assessment Active Problems ICD-10 Venous insufficiency (chronic) (peripheral)  Type 2 diabetes mellitus with other skin ulcer Non-pressure chronic ulcer of other part of right lower leg with fat layer exposed Non-pressure chronic ulcer of other part of left lower leg with fat layer exposed Essential (primary) hypertension Chronic combined systolic  (congestive) and diastolic (congestive) heart failure Dependence on supplemental oxygen Procedures Wound #1 Pre-procedure diagnosis of Wound #1 is a Venous Leg Ulcer located on the Right,Anterior Lower Leg . There was a Haematologist Compression Therapy Procedure by Levan Hurst, RN. Post procedure Diagnosis Wound #1: Same as Pre-Procedure Wound #2 Pre-procedure diagnosis of Wound #2 is a Venous Leg Ulcer located on the Left,Anterior Lower Leg . There was a Haematologist Compression Therapy Procedure by Levan Hurst, RN. Post procedure Diagnosis Wound #2: Same as Pre-Procedure Wound #3 Pre-procedure diagnosis of Wound #3 is a Venous Leg Ulcer located on the Right,Medial Lower Leg . There was a Haematologist Compression Therapy Procedure by Levan Hurst, RN. Post procedure Diagnosis Wound #3: Same as Pre-Procedure Plan Follow-up Appointments: Return Appointment in 2 weeks. - MD visit Nurse Visit: - 1 week for rewrap Bathing/ Shower/ Hygiene: May shower with protection but do not get wound dressing(s) wet. Edema Control - Lymphedema / SCD / Other: Elevate legs to the level of the heart or above for 30 minutes daily and/or when sitting, a frequency of: - throughout the day Avoid standing for long periods of time. Exercise regularly Services and Therapies ordered were: Arterial Studies- Bilateral with ABIs and TBIs - Non healing ulcers on bilateral lower legs, abnormal arterial study in 2019. WOUND #1: - Lower Leg Wound Laterality: Right, Anterior Cleanser: Wound Cleanser 1 x Per Week/ Discharge Instructions: Cleanse the wound with wound cleanser prior to applying a clean dressing using gauze sponges, not tissue or cotton balls. Peri-Wound Care: Sween Lotion (Moisturizing lotion) 1 x Per Week/ Discharge Instructions: Apply moisturizing lotion as directed Prim Dressing: KerraCel Ag Gelling Fiber Dressing, 4x5 in (silver alginate) 1 x Per Week/ ary Discharge Instructions: Apply silver alginate  to wound bed as instructed Secondary Dressing: Woven Gauze Sponge, Non-Sterile 4x4 in 1 x Per Week/ Discharge Instructions: Apply over primary dressing as directed. Secondary Dressing: ABD Pad, 8x10 1 x Per Week/ Discharge Instructions: Apply over primary dressing as directed. Com pression Wrap: Unnaboot w/Calamine, 4x10 (in/yd) 1 x Per Week/ Discharge Instructions: Apply Unnaboot as directed. WOUND #2: - Lower Leg Wound Laterality: Left, Anterior Cleanser: Wound Cleanser 1 x Per Week/ Discharge Instructions: Cleanse the wound with wound cleanser prior to applying a clean dressing using gauze sponges, not tissue or cotton balls. Peri-Wound Care: Sween Lotion (Moisturizing lotion) 1 x Per Week/ Discharge Instructions: Apply moisturizing lotion as directed Prim Dressing: KerraCel Ag Gelling Fiber Dressing, 4x5 in (silver alginate) 1 x Per Week/ ary Discharge Instructions: Apply silver alginate to wound bed as instructed Secondary Dressing: Woven Gauze Sponge, Non-Sterile 4x4 in 1 x Per Week/ Discharge Instructions: Apply over primary dressing as directed. Secondary Dressing: ABD Pad, 8x10 1 x Per Week/ Discharge Instructions: Apply over primary dressing as directed. Com pression Wrap: Unnaboot w/Calamine, 4x10 (in/yd) 1 x Per Week/ Discharge Instructions: Apply Unnaboot as directed. WOUND #3: - Lower Leg Wound Laterality: Right, Medial Cleanser: Wound Cleanser 1 x Per Week/ Discharge Instructions: Cleanse the wound with wound cleanser prior to applying a clean dressing using gauze sponges, not tissue or cotton balls. Peri-Wound Care: Sween Lotion (Moisturizing lotion) 1 x Per Week/ Discharge Instructions: Apply moisturizing lotion as directed Prim Dressing: KerraCel Ag Heidi Dach  Fiber Dressing, 4x5 in (silver alginate) 1 x Per Week/ ary Discharge Instructions: Apply silver alginate to wound bed as instructed Secondary Dressing: Woven Gauze Sponge, Non-Sterile 4x4 in 1 x Per Week/ Discharge  Instructions: Apply over primary dressing as directed. Secondary Dressing: ABD Pad, 8x10 1 x Per Week/ Discharge Instructions: Apply over primary dressing as directed. Com pression Wrap: Unnaboot w/Calamine, 4x10 (in/yd) 1 x Per Week/ Discharge Instructions: Apply Unnaboot as directed. 1. Would recommend currently that we actually go ahead and initiate treatment with a silver alginate dressing to the open wound locations I think this is going to be the most appropriate way to go. 2. Also recommend currently we initiate an Unna boot wrap I think this will be the safest wrap considering the fact that she may not have the best blood flow. I think this is a good starting place. 3. I am also can recommend that we have the patient continue to monitor for any signs of worsening infection obviously this includes increased pain or increased drainage. We will be seeing her to change her dressings and will be keeping an eye on this as well. 4. I am going to recommend currently she continue with the clindamycin I think that is best she should take this to completion. 5. I am going make a referral to the vascular center for ABIs and TBI's of bilateral lower extremities to ensure she does not have any worsening arterial flow. We will see patient back for reevaluation in 1 week here in the clinic. If anything worsens or changes patient will contact our office for additional recommendations. This will be for nurse visit and then I will see her in 2 weeks. Electronic Signature(s) Signed: 11/07/2020 3:02:16 PM By: Worthy Keeler PA-C Entered By: Worthy Keeler on 11/07/2020 15:02:15 -------------------------------------------------------------------------------- HxROS Details Patient Name: Date of Service: Sara Graves, Sara Sara B. 11/07/2020 1:15 PM Medical Record Number: [id number] Patient Account Number: [id number] Date of Birth/Sex: Treating RN: May 07, 1941 (79 y.o. Sara Graves Primary Care Provider:  Roe Coombs Other Clinician: Referring Provider: Treating Provider/Extender: Adolphus Birchwood in Treatment: 0 Information Obtained From Patient Constitutional Symptoms (General Health) Complaints and Symptoms: Negative for: Fatigue; Fever; Chills; Marked Weight Change Medical History: Past Medical History Notes: thrombocytopenia thyroid disease Eyes Complaints and Symptoms: Positive for: Glasses / Contacts - glasses Negative for: Dry Eyes; Vision Changes Medical History: Positive for: Glaucoma - starting Negative for: Cataracts; Optic Neuritis Ear/Nose/Mouth/Throat Complaints and Symptoms: Negative for: Chronic sinus problems or rhinitis Medical History: Negative for: Chronic sinus problems/congestion; Middle ear problems Respiratory Complaints and Symptoms: Negative for: Chronic or frequent coughs; Shortness of Breath Medical History: Negative for: Aspiration; Asthma; Chronic Obstructive Pulmonary Disease (COPD); Pneumothorax; Sleep Apnea; Tuberculosis Past Medical History Notes: 02 continuous 2L [place]. Gastrointestinal Complaints and Symptoms: Negative for: Frequent diarrhea; Nausea; Vomiting Medical History: Negative for: Cirrhosis ; Colitis; Crohns; Hepatitis A; Hepatitis B; Hepatitis C Endocrine Complaints and Symptoms: Negative for: Heat/cold intolerance Medical History: Positive for: Type II Diabetes Negative for: Type I Diabetes Time with diabetes: 15 years Treated with: Insulin, Diet Blood sugar tested every day: Yes Tested : daily Genitourinary Complaints and Symptoms: Positive for: Frequent urination - related to x3 diuretics Medical History: Negative for: End Stage Renal Disease Integumentary (Skin) Complaints and Symptoms: Positive for: Wounds - BLE Medical History: Negative for: History of Burn Musculoskeletal Complaints and Symptoms: Negative for: Muscle Pain; Muscle Weakness Medical History: Negative for: Gout; Rheumatoid  Arthritis; Osteoarthritis; Osteomyelitis Neurologic  Complaints and Symptoms: Negative for: Numbness/parasthesias Medical History: Positive for: Neuropathy Negative for: Dementia; Quadriplegia; Paraplegia; Seizure Disorder Psychiatric Complaints and Symptoms: Negative for: Claustrophobia; Suicidal Medical History: Negative for: Anorexia/bulimia; Confinement Anxiety Past Medical History Notes: anxiety and depression Hematologic/Lymphatic Medical History: Negative for: Anemia; Hemophilia; Human Immunodeficiency Virus; Lymphedema; Sickle Cell Disease Cardiovascular Medical History: Positive for: Congestive Heart Failure; Coronary Artery Disease - Stent 6/19,01/2019; Hypertension; Myocardial Infarction - 2019; Peripheral Venous Disease Negative for: Angina; Arrhythmia; Deep Vein Thrombosis; Hypotension; Peripheral Arterial Disease; Phlebitis; Vasculitis Past Medical History Notes: defibrillator 2019 hyperlipidemia Immunological Medical History: Negative for: Lupus Erythematosus; Raynauds; Scleroderma Oncologic Medical History: Negative for: Received Chemotherapy; Received Radiation Past Medical History Notes: basal skin Ca HBO Extended History Items Eyes: Glaucoma Immunizations Pneumococcal Vaccine: Received Pneumococcal Vaccination: Yes Implantable Devices Yes Hospitalization / Surgery History Type of Hospitalization/Surgery back surgery implant stimulator right knee replacement x2 right shoulder sx x1 left shoulder sx MI/defibrillator placement 2019 osteomyelitis right met head sx 10 years ago Family and Social History Cancer: Yes - Mother,Father,Child; Diabetes: No; Heart Disease: Yes - Maternal Grandparents,Paternal Grandparents; Hypertension: Yes - Paternal Grandparents,Maternal Grandparents; Kidney Disease: No; Lung Disease: Yes - Child; Seizures: No; Stroke: No; Thyroid Problems: No; Tuberculosis: No; Never smoker; Marital Status - Married; Alcohol Use: Never; Drug  Use: No History; Caffeine Use: Daily - soda; Financial Concerns: No; Food, Clothing or Shelter Needs: No; Support System Lacking: No; Transportation Concerns: No Electronic Signature(s) Signed: 11/07/2020 5:13:59 PM By: Worthy Keeler PA-C Signed: 11/07/2020 5:18:14 PM By: Deon Pilling Entered By: Deon Pilling on 11/07/2020 13:50:25 -------------------------------------------------------------------------------- SuperBill Details Patient Name: Date of Service: Sara Graves, [place]. 11/07/2020 Medical Record Number: [id number] Patient Account Number: [id number] Date of Birth/Sex: Treating RN: Aug 06, 1941 (79 y.o. Sara Graves Primary Care Provider: Roe Coombs Other Clinician: Referring Provider: Treating Provider/Extender: Adolphus Birchwood in Treatment: 0 Diagnosis Coding ICD-10 Codes Code Description I87.2 Venous insufficiency (chronic) (peripheral) E11.622 Type 2 diabetes mellitus with other skin ulcer L97.812 Non-pressure chronic ulcer of other part of right lower leg with fat layer exposed L97.822 Non-pressure chronic ulcer of other part of left lower leg with fat layer exposed I10 Essential (primary) hypertension I50.42 Chronic combined systolic (congestive) and diastolic (congestive) heart failure Z99.81 Dependence on supplemental oxygen Facility Procedures CPT4 Code: 22241146 Description: [place] VISIT-LEV 3 EST PT Modifier: 25 Quantity: 1 CPT4 Code: 43142767 Description: 01100 - APPLY UNNA BOOT/PROFO BILATERAL Modifier: Quantity: 1 Physician Procedures : CPT4 Code Description Modifier [id number] - WC PHYS LEVEL 4 - NEW PT ICD-10 Diagnosis Description I87.2 Venous insufficiency (chronic) (peripheral) E11.622 Type 2 diabetes mellitus with other skin ulcer L97.812 Non-pressure chronic ulcer of other  part of right lower leg with fat layer exposed L97.822 Non-pressure chronic ulcer of other part of left lower leg with fat layer  exposed Quantity: 1 Electronic Signature(s) Signed: 11/07/2020 7:00:57 PM By: Levan Hurst RN, BSN Signed: 11/08/2020 4:47:28 PM By: Worthy Keeler PA-C Previous Signature: 11/07/2020 3:02:37 PM Version By: Worthy Keeler PA-C Entered By: Levan Hurst on 11/07/2020 17:52:55

## 2020-11-08 NOTE — Telephone Encounter (Signed)
Please inform the patient to have labs done in one week to check kidney function and electrolytes. Labs placed and released.

## 2020-11-14 ENCOUNTER — Encounter (HOSPITAL_BASED_OUTPATIENT_CLINIC_OR_DEPARTMENT_OTHER): Payer: Medicare PPO | Admitting: Physician Assistant

## 2020-11-14 ENCOUNTER — Other Ambulatory Visit: Payer: Self-pay

## 2020-11-14 DIAGNOSIS — E11622 Type 2 diabetes mellitus with other skin ulcer: Secondary | ICD-10-CM | POA: Diagnosis not present

## 2020-11-14 NOTE — Progress Notes (Signed)
Sara Graves Sara Graves ([id number]) , Visit Report for 11/14/2020 Arrival Information Details Patient Name: Date of Service: Sara Graves Sara Graves 11/14/2020 12:45 PM Medical Record Number: [id number] Patient Account Number: [id number] Date of Birth/Sex: Treating RN: 1941-05-12 (79 y.o. Sara Graves, Sara Graves Primary Care Sara Graves: Sara Graves Other Clinician: Referring Sara Graves: Treating Sara Graves/Extender: Sara Graves in Treatment: 1 Visit Information History Since Last Visit Added or deleted any medications: No Patient Arrived: Sara Graves Any new allergies or adverse reactions: No Arrival Time: 12:41 Had a fall or experienced change in No Accompanied By: self activities of daily living that may affect Transfer Assistance: None risk of falls: Patient Identification Verified: Yes Signs or symptoms of abuse/neglect since last visito No Secondary Verification Process Completed: Yes Hospitalized since last visit: No Patient Requires Transmission-Based Precautions: No Implantable device outside of the clinic excluding No Patient Has Alerts: Yes cellular tissue based products placed in the center Patient Alerts: Patient on Blood Thinner since last visit: Has Dressing in Place as Prescribed: Yes Has Compression in Place as Prescribed: Yes Pain Present Now: No Electronic Signature(s) Signed: 11/14/2020 2:08:48 PM By: Deon Pilling Entered By: Deon Pilling on 11/14/2020 12:57:22 -------------------------------------------------------------------------------- Compression Therapy Details Patient Name: Date of Service: Sara Graves [name], [name] 11/14/2020 12:45 PM Medical Record Number: [id number] Patient Account Number: [id number] Date of Birth/Sex: Treating RN: [birth year] (79 y.o. Sara Graves Primary Care Sara Graves: Sara Graves Other Clinician: Referring Sara Graves: Treating Sara Graves/Extender: Sara Graves in Treatment: 1 Compression Therapy  Performed for Wound Assessment: Wound #1 Right,Anterior Lower Leg Performed By: Clinician Deon Pilling, RN Compression Type: Rolena Infante Electronic Signature(s) Signed: 11/14/2020 2:08:48 PM By: Deon Pilling Entered By: Deon Pilling on 11/14/2020 13:01:05 -------------------------------------------------------------------------------- Compression Therapy Details Patient Name: Date of Service: Sara Graves [name], [name] 11/14/2020 12:45 PM Medical Record Number: [id number] Patient Account Number: [id number] Date of Birth/Sex: Treating RN: 03-04-1941 (79 y.o. Sara Graves Primary Care Adekunle Rohrbach: Sara Graves Other Clinician: Referring Sara Graves: Treating Sara Graves/Extender: Sara Graves in Treatment: 1 Compression Therapy Performed for Wound Assessment: Wound #3 Right,Medial Lower Leg Performed By: Clinician Deon Pilling, RN Compression Type: Rolena Infante Electronic Signature(s) Signed: 11/14/2020 2:08:48 PM By: Deon Pilling Entered By: Deon Pilling on 11/14/2020 13:01:05 -------------------------------------------------------------------------------- Compression Therapy Details Patient Name: Date of Service: Sara Graves [name], [name] 11/14/2020 12:45 PM Medical Record Number: [id number] Patient Account Number: [id number] Date of Birth/Sex: Treating RN: 10-21-1941 (79 y.o. Sara Graves Primary Care Dallan Schonberg: Sara Graves Other Clinician: Referring Sara Graves: Treating Sara Graves/Extender: Sara Graves in Treatment: 1 Compression Therapy Performed for Wound Assessment: Wound #2 Left,Anterior Lower Leg Performed By: Clinician Deon Pilling, RN Compression Type: Rolena Infante Electronic Signature(s) Signed: 11/14/2020 2:08:48 PM By: Deon Pilling Entered By: Deon Pilling on 11/14/2020 13:01:05 -------------------------------------------------------------------------------- Encounter Discharge Information Details Patient Name: Date of  Service: Sara Graves [name], Sara Graves 11/14/2020 12:45 PM Medical Record Number: [id number] Patient Account Number: [id number] Date of Birth/Sex: Treating RN: Oct 17, 1941 (79 y.o. Sara Graves Primary Care Sara Graves: Sara Graves Other Clinician: Referring Sara Graves: Treating Sara Graves/Extender: Sara Graves in Treatment: 1 Encounter Discharge Information Items Discharge Condition: Stable Ambulatory Status: Walker Discharge Destination: Home Transportation: Private Auto Accompanied By: self Schedule Follow-up Appointment: Yes Clinical Summary of Care: Electronic Signature(s) Signed: 11/14/2020 2:08:48 PM By: Deon Pilling Entered By: Deon Pilling on 11/14/2020 13:02:34 -------------------------------------------------------------------------------- Patient/Caregiver Education Details Patient Name: Date of Service: Sara Graves [name], [name] 12/29/2021andnbsp12:45  PM Medical Record Number: [id number] Patient Account Number: [id number] Date of Birth/Gender: Treating RN: 23-Nov-1940 (79 y.o. Sara Graves Primary Care Physician: Sara Graves Other Clinician: Referring Physician: Treating Physician/Extender: Sara Graves in Treatment: 1 Education Assessment Education Provided To: Patient Education Topics Provided Wound/Skin Impairment: Handouts: Skin Care Do's and Dont's Methods: Explain/Verbal Responses: Reinforcements needed Electronic Signature(s) Signed: 11/14/2020 2:08:48 PM By: Deon Pilling Entered By: Deon Pilling on 11/14/2020 13:02:24 -------------------------------------------------------------------------------- Wound Assessment Details Patient Name: Date of Service: Sara Graves [name], [name] 11/14/2020 12:45 PM Medical Record Number: [id number] Patient Account Number: [id number] Date of Birth/Sex: Treating RN: 08/14/1941 (79 y.o. Sara Graves, Sara Graves Primary Care Hamsa Laurich: Sara Graves Other Clinician: Referring  Taveon Enyeart: Treating Sara Graves/Extender: Sara Graves in Treatment: 1 Wound Status Wound Number: 1 Primary Etiology: Venous Leg Ulcer Wound Location: Right, Anterior Lower Leg Secondary Etiology: Diabetic Wound/Ulcer of the Lower Extremity Wounding Event: Blister Wound Status: Open Date Acquired: 10/26/2020 Graves Of Treatment: 1 Clustered Wound: No Wound Measurements Length: (cm) Width: (cm) Depth: (cm) Area: (cm) Volume: (cm) 0 % Reduction in Area: 100% 0 % Reduction in Volume: 100% 0 0 0 Wound Description Classification: Full Thickness Without Exposed Support Structur es Treatment Notes Wound #1 (Lower Leg) Wound Laterality: Right, Anterior Cleanser Wound Cleanser Discharge Instruction: Cleanse the wound with wound cleanser prior to applying a clean dressing using gauze sponges, not tissue or cotton balls. Peri-Wound Care Sween Lotion (Moisturizing lotion) Discharge Instruction: Apply moisturizing lotion as directed Topical Primary Dressing KerraCel Ag Gelling Fiber Dressing, 4x5 in (silver alginate) Discharge Instruction: Apply silver alginate to wound bed as instructed Secondary Dressing Woven Gauze Sponge, Non-Sterile 4x4 in Discharge Instruction: Apply over primary dressing as directed. ABD Pad, 8x10 Discharge Instruction: Apply over primary dressing as directed. Secured With Compression Wrap Unnaboot w/Calamine, 4x10 (in/yd) Discharge Instruction: Apply Unnaboot as directed. Compression Stockings Add-Ons Electronic Signature(s) Signed: 11/14/2020 2:08:48 PM By: Deon Pilling Entered By: Deon Pilling on 11/14/2020 13:00:51 -------------------------------------------------------------------------------- Wound Assessment Details Patient Name: Date of Service: Sara Graves [name], [name] 11/14/2020 12:45 PM Medical Record Number: [id number] Patient Account Number: [id number] Date of Birth/Sex: Treating RN: 1941-11-13 (79 y.o. Sara Graves,  Sara Graves Primary Care Taniaya Rudder: Sara Graves Other Clinician: Referring Edgerrin Correia: Treating Corbin Falck/Extender: Sara Graves in Treatment: 1 Wound Status Wound Number: 2 Primary Etiology: Venous Leg Ulcer Wound Location: Left, Anterior Lower Leg Secondary Etiology: Diabetic Wound/Ulcer of the Lower Extremity Wounding Event: Blister Wound Status: Open Date Acquired: 10/26/2020 Graves Of Treatment: 1 Clustered Wound: No Wound Measurements Length: (cm) 4 Width: (cm) 4.4 Depth: (cm) 0.1 Area: (cm) 13.823 Volume: (cm) 1.382 % Reduction in Area: 0% % Reduction in Volume: 0% Wound Description Classification: Full Thickness Without Exposed Support Structur es Treatment Notes Wound #2 (Lower Leg) Wound Laterality: Left, Anterior Cleanser Wound Cleanser Discharge Instruction: Cleanse the wound with wound cleanser prior to applying a clean dressing using gauze sponges, not tissue or cotton balls. Peri-Wound Care Sween Lotion (Moisturizing lotion) Discharge Instruction: Apply moisturizing lotion as directed Topical Primary Dressing KerraCel Ag Gelling Fiber Dressing, 4x5 in (silver alginate) Discharge Instruction: Apply silver alginate to wound bed as instructed Secondary Dressing Woven Gauze Sponge, Non-Sterile 4x4 in Discharge Instruction: Apply over primary dressing as directed. ABD Pad, 8x10 Discharge Instruction: Apply over primary dressing as directed. Secured With Compression Wrap Unnaboot w/Calamine, 4x10 (in/yd) Discharge Instruction: Apply Unnaboot as directed. Compression Stockings Add-Ons Electronic Signature(s) Signed: 11/14/2020 2:08:48 PM By: Deon Pilling Entered  By: Deon Pilling on 11/14/2020 13:00:51 -------------------------------------------------------------------------------- Wound Assessment Details Patient Name: Date of Service: Sara Graves Sara Graves 11/14/2020 12:45 PM Medical Record Number: [id number] Patient Account Number:  [id number] Date of Birth/Sex: Treating RN: 13-Oct-1941 (79 y.o. Sara Graves, Sara Graves Primary Care Lavoris Canizales: Sara Graves Other Clinician: Referring Rhyli Depaula: Treating Efren Kross/Extender: Sara Graves in Treatment: 1 Wound Status Wound Number: 3 Primary Etiology: Venous Leg Ulcer Wound Location: Right, Medial Lower Leg Secondary Etiology: Diabetic Wound/Ulcer of the Lower Extremity Wounding Event: Blister Wound Status: Open Date Acquired: 10/26/2020 Graves Of Treatment: 1 Clustered Wound: Yes Wound Measurements Length: (cm) 3.8 Width: (cm) 2.5 Depth: (cm) 0.2 Area: (cm) 7.461 Volume: (cm) 1.492 % Reduction in Area: 0% % Reduction in Volume: 0% Wound Description Classification: Full Thickness Without Exposed Support Structur es Treatment Notes Wound #3 (Lower Leg) Wound Laterality: Right, Medial Cleanser Wound Cleanser Discharge Instruction: Cleanse the wound with wound cleanser prior to applying a clean dressing using gauze sponges, not tissue or cotton balls. Peri-Wound Care Sween Lotion (Moisturizing lotion) Discharge Instruction: Apply moisturizing lotion as directed Topical Primary Dressing KerraCel Ag Gelling Fiber Dressing, 4x5 in (silver alginate) Discharge Instruction: Apply silver alginate to wound bed as instructed Secondary Dressing Woven Gauze Sponge, Non-Sterile 4x4 in Discharge Instruction: Apply over primary dressing as directed. ABD Pad, 8x10 Discharge Instruction: Apply over primary dressing as directed. Secured With Compression Wrap Unnaboot w/Calamine, 4x10 (in/yd) Discharge Instruction: Apply Unnaboot as directed. Compression Stockings Add-Ons Electronic Signature(s) Signed: 11/14/2020 2:08:48 PM By: Deon Pilling Entered By: Deon Pilling on 11/14/2020 13:00:51 -------------------------------------------------------------------------------- Vitals Details Patient Name: Date of Service: Sara Graves [name], [name] 11/14/2020  12:45 PM Medical Record Number: [id number] Patient Account Number: [id number] Date of Birth/Sex: Treating RN: [birth year] (79 y.o. Sara Graves, Sara Graves Primary Care Daelyn Pettaway: Sara Graves Other Clinician: Referring Sabin Gibeault: Treating Rafel Garde/Extender: Sara Graves in Treatment: 1 Vital Signs Time Taken: 12:41 Temperature (F): 98.3 Height (in): 63 Pulse (bpm): 83 Weight (lbs): 167 Respiratory Rate (breaths/min): 20 Body Mass Index (BMI): 29.6 Blood Pressure (mmHg): 132/62 Reference Range: 80 - 120 mg / dl Electronic Signature(s) Signed: 11/14/2020 2:08:48 PM By: Deon Pilling Entered By: Deon Pilling on 11/14/2020 12:57:38

## 2020-11-15 LAB — BASIC METABOLIC PANEL
BUN/Creatinine Ratio: 17 (ref 12–28)
BUN: 16 mg/dL (ref 8–27)
CO2: 27 mmol/L (ref 20–29)
Calcium: 9.2 mg/dL (ref 8.7–10.3)
Chloride: 105 mmol/L (ref 96–106)
Creatinine, Ser: 0.93 mg/dL (ref 0.57–1.00)
GFR calc Af Amer: 68 mL/min/{1.73_m2} (ref >59)
GFR calc non Af Amer: 59 mL/min/{1.73_m2} — ABNORMAL LOW (ref >59)
Glucose: 206 mg/dL — ABNORMAL HIGH (ref 65–99)
Potassium: 3.6 mmol/L (ref 3.5–5.2)
Sodium: 145 mmol/L — ABNORMAL HIGH (ref 134–144)

## 2020-11-15 LAB — PRO B NATRIURETIC PEPTIDE: NT-Pro BNP: 10511 pg/mL — ABNORMAL HIGH (ref 0–738)

## 2020-11-15 LAB — MAGNESIUM: Magnesium: 1.9 mg/dL (ref 1.6–2.3)

## 2020-11-19 ENCOUNTER — Encounter: Payer: Self-pay | Admitting: Cardiology

## 2020-11-19 ENCOUNTER — Other Ambulatory Visit: Payer: Self-pay

## 2020-11-19 ENCOUNTER — Ambulatory Visit: Payer: Medicare PPO | Admitting: Cardiology

## 2020-11-19 VITALS — BP 157/72 | HR 88 | Resp 16 | Ht 62.0 in | Wt 187.0 lb

## 2020-11-19 DIAGNOSIS — Z794 Long term (current) use of insulin: Secondary | ICD-10-CM

## 2020-11-19 DIAGNOSIS — Z955 Presence of coronary angioplasty implant and graft: Secondary | ICD-10-CM

## 2020-11-19 DIAGNOSIS — E782 Mixed hyperlipidemia: Secondary | ICD-10-CM

## 2020-11-19 DIAGNOSIS — Z9981 Dependence on supplemental oxygen: Secondary | ICD-10-CM

## 2020-11-19 DIAGNOSIS — Z95810 Presence of automatic (implantable) cardiac defibrillator: Secondary | ICD-10-CM

## 2020-11-19 DIAGNOSIS — I2510 Atherosclerotic heart disease of native coronary artery without angina pectoris: Secondary | ICD-10-CM

## 2020-11-19 DIAGNOSIS — E1159 Type 2 diabetes mellitus with other circulatory complications: Secondary | ICD-10-CM

## 2020-11-19 DIAGNOSIS — I5041 Acute combined systolic (congestive) and diastolic (congestive) heart failure: Secondary | ICD-10-CM

## 2020-11-19 DIAGNOSIS — I447 Left bundle-branch block, unspecified: Secondary | ICD-10-CM

## 2020-11-19 DIAGNOSIS — I10 Essential (primary) hypertension: Secondary | ICD-10-CM

## 2020-11-19 DIAGNOSIS — I255 Ischemic cardiomyopathy: Secondary | ICD-10-CM

## 2020-11-19 MED ORDER — ENTRESTO 97-103 MG PO TABS: 1.0000 | ORAL_TABLET | Freq: Two times a day (BID) | ORAL | 0 refills | Status: DC

## 2020-11-19 NOTE — Progress Notes (Signed)
Primary Physician:  [name], PA-C   [name], female    DOB: [birth year], 80 y.o.   MRN: [id number]   Date: 11/19/20 Last Office Visit: 08/14/2020  Subjective:    Chief Complaint  Patient presents with  . Chronic combined systolic and diastolic CHF, NYHA class 3 (  . Follow-up    3 month    HPI: Sara Graves  is a 80 y.o. female  with hypertension, insulin-dependent diabetes mellitus type 2, CAD s/p prior LAD and ramus PCI, HFrEF, ischemic cardiomyopathy, s/p BiV ICD implantation 06/2019,  h/o Rt leg cellulitis and metatarsal fracture (managed by Podiatry) with chief complaint of "heart failure management."   Congestive heart failure: Last hospitalization for heart failure was back in March 2020.  Since last office visit patient had a remote ICD check which noted pulmonary congestion and was recommended to uptitrate diuretic therapy.  Patient states that she does have lower extremity swelling and orthopnea but denies paroxysmal nocturnal dyspnea.  She was recommended to take torsemide 10 mg p.o. twice daily.  However to prevent going to the bathroom so often as she is taking care of her son who is critically ill she decided to take torsemide 10 mg once a day to decrease bathroom trips.  She is currently on 2 L nasal cannula oxygen.  She is approximately 13 pounds heavier since last visit most likely secondary to heart failure exacerbation.  She states that she has had decreased oral intake.  She recently had gone to [facility] ER for nausea and diarrhea and worsening bilateral lower extremity wounds.  She currently is being treated at the wound care at [facility].  Both of her legs are wrapped up to the knees.  He denies any active chest pain.  She has required the use of 1 sublingual nitroglycerin tablets since last office visit. Her recent coronary angiogram was on 01/27/19 and PCI to LAD  and balloon PTCA to IST RI lesion.  Does not complain of chest  discomfort at rest or with effort related activities.  No recent use of sublingual nitroglycerin tablets.  She underwent echocardiogram on 05/19/19 that continued to reveal depressed LVEF of 20-25%.  She was evaluated by Dr. Lovena Le and underwent BiV ICD implantation on 06/21/2019.  Of note, she has a son who has muscular dystrophy who is critically ill and has been hospitalized multiple times.  He is enrolled into the hospice.  All these events have transpired in the last several weeks during which time patient's heart failure exacerbation has occurred as well.  Patient is educated on importance of medication compliance, low-salt diet, fluid intake.  Past Medical History:  Diagnosis Date  . Acute combined systolic and diastolic heart failure ([place]) 04/25/2018  . Anxiety   . Arthritis    "hands" (12/29/2017)  . Basal cell carcinoma of nose    removed  . Bursitis of left shoulder   . BV ICD Medtronic [place] MRI CRTD 06/21/2019  . Cat scratch fever    Late 90s  . Chronic combined systolic and diastolic CHF (congestive heart failure) ([place]) 06/08/2019  . Coronary artery disease   . Depression   . Diabetes mellitus, type II, insulin dependent ([place])   . Encounter for assessment of implantable cardioverter-defibrillator (ICD) 09/28/2019  . GERD (gastroesophageal reflux disease)   . H. pylori infection 2008 and 1998   treated  . Hyperlipidemia   . Hypertension   . Hypothyroidism   . Low  oxygen saturation   . Multinodular goiter   . Rotator cuff tear, left recurrent   . Urge urinary incontinence   . UTI (lower urinary tract infection) 05/2016    Past Surgical History:  Procedure Laterality Date  . APPENDECTOMY  AGE 89  . BACK SURGERY    . BASAL CELL CARCINOMA EXCISION     "nose"  . BIV ICD INSERTION CRT-D N/A 06/21/2019   Procedure: BIV ICD INSERTION CRT-D;  Surgeon: Evans Lance, MD;  Location: [place] [facility];  Service: Cardiovascular;  Laterality: N/A;  . BLADDER NECK SUSPENSION   1970's  . BUNIONECTOMY WITH HAMMERTOE RECONSTRUCTION Bilateral   . CARPAL TUNNEL RELEASE Right 05/2017  . COLONOSCOPY W/ POLYPECTOMY    . CORONARY ANGIOPLASTY WITH STENT PLACEMENT  12/29/2017  . CORONARY BALLOON ANGIOPLASTY N/A 02/08/2019   Procedure: CORONARY BALLOON ANGIOPLASTY;  Surgeon: Adrian Prows, MD;  Location: [place] [facility];  Service: Cardiovascular;  Laterality: N/A;  . CORONARY STENT INTERVENTION N/A 12/29/2017   Procedure: CORONARY STENT INTERVENTION;  Surgeon: Nigel Mormon, MD;  Location: [place] [facility];  Service: Cardiovascular;  Laterality: N/A;  . CORONARY STENT INTERVENTION N/A 04/26/2018   Procedure: CORONARY STENT INTERVENTION;  Surgeon: Nigel Mormon, MD;  Location: [place] [facility];  Service: Cardiovascular;  Laterality: N/A;  . CORONARY STENT INTERVENTION N/A 02/08/2019   Procedure: CORONARY STENT INTERVENTION;  Surgeon: Adrian Prows, MD;  Location: [place] [facility];  Service: Cardiovascular;  Laterality: N/A;  . ESOPHAGOGASTRODUODENOSCOPY ENDOSCOPY    . FORAMINAL DECOMPRESSION AT L2 TO THE SACRUM  01-05-2008   L2  -  S1  . GANGLION CYST EXCISION Left 01/17/2009   ring finger  . IMPLANTATION PERMANENT SPINAL CORD STIMULATOR  06-15-2008   JUNE 2013--  BATTERY CHANGE  . JOINT REPLACEMENT    . LEFT HEART CATH AND CORONARY ANGIOGRAPHY N/A 04/26/2018   Procedure: LEFT HEART CATH AND CORONARY ANGIOGRAPHY;  Surgeon: Nigel Mormon, MD;  Location: [place] [facility];  Service: Cardiovascular;  Laterality: N/A;  . LEFT HEART CATH AND CORONARY ANGIOGRAPHY N/A 02/08/2019   Procedure: LEFT HEART CATH AND CORONARY ANGIOGRAPHY;  Surgeon: Adrian Prows, MD;  Location: [place] [facility];  Service: Cardiovascular;  Laterality: N/A;  . LIPOMA EXCISION Right    RIGHT ELBOW  . METATARSAL HEAD EXCISION Right 06/16/2018   Procedure: right 5th metatarsal excision, a mini c-arm;  Surgeon: Trula Slade, DPM;  Location: [facility];  Service: Podiatry;  Laterality:  Right;  anesthesia can do block  . RIGHT/LEFT HEART CATH AND CORONARY ANGIOGRAPHY N/A 12/29/2017   Procedure: RIGHT/LEFT HEART CATH AND CORONARY ANGIOGRAPHY;  Surgeon: Nigel Mormon, MD;  Location: [place] [facility];  Service: Cardiovascular;  Laterality: N/A;  . SHOULDER OPEN ROTATOR CUFF REPAIR  09/07/2012   Procedure: ROTATOR CUFF REPAIR SHOULDER OPEN;  Surgeon: Magnus Sinning, MD;  Location: [place];  Service: Orthopedics;  Laterality: Left;  OPEN ANTERIOR ACROMIONECTOMY AND ROTATOR CUFF REPAIR ON LEFT   . SHOULDER OPEN ROTATOR CUFF REPAIR Left 12/28/2012   Procedure: ROTATOR CUFF REPAIR SHOULDER OPEN;  Surgeon: Magnus Sinning, MD;  Location: [place];  Service: Orthopedics;  Laterality: Left;  OPEN SHOULDER ROTATOR CUFF REPAIR ON LEFT  WITH ANTERIOR ACROMINECTOMY   . SHOULDER OPEN ROTATOR CUFF REPAIR Right 03/28/2003   RIGHT SHOULDER  DEGENERATIVE AC JOINT AND RC TEAR  . SPINAL CORD STIMULATOR BATTERY EXCHANGE N/A 07/03/2016   Procedure: SPINAL  CORD STIMULATOR BATTERY PLACMENT;  Surgeon: Melina Schools, MD;  Location: [place];  Service: Orthopedics;  Laterality: N/A;  . TONSILLECTOMY  AGE 14  . 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 VAGINAL HYSTERECTOMY  1970's   Social History   Tobacco Use  . Smoking status: Never Smoker  . Smokeless tobacco: Never Used  Vaping Use  . Vaping Use: Never used  Substance Use Topics  . Alcohol use: No  . Drug use: No    Review of Systems  Constitutional: Positive for weight gain. Negative for chills, decreased appetite and malaise/fatigue.  Cardiovascular: Positive for dyspnea on exertion, leg swelling and orthopnea (2 pillows (1 pillow baseline)). Negative for chest pain and syncope.  Respiratory: Negative for cough.   Endocrine: Negative for cold intolerance.  Hematologic/Lymphatic: Does not bruise/bleed easily.  Musculoskeletal: Positive for falls. Negative for joint swelling.   Gastrointestinal: Negative for abdominal pain, anorexia, change in bowel habit, diarrhea and nausea.  Neurological: Negative for headaches and light-headedness.  Psychiatric/Behavioral: Negative for depression and substance abuse.  All other systems reviewed and are negative.     Objective:   Vitals with BMI 11/19/2020 11/19/2020 10/02/2020  Height - 5\' 2"  5\' 2"   Weight - 187 lbs 174 lbs  BMI - 62.22 97.98  Systolic 921 194 174  Diastolic 72 78 66  Pulse 88 86 57   Physical Exam Vitals reviewed.  Constitutional:      General: She is not in acute distress.    Appearance: She is well-developed.  HENT:     Head: Atraumatic.  Eyes:     Conjunctiva/sclera: Conjunctivae normal.  Neck:     Thyroid: No thyromegaly.     Vascular: No JVD.  Cardiovascular:     Rate and Rhythm: Normal rate and regular rhythm.     Pulses: Intact distal pulses.          Carotid pulses are 2+ on the right side with bruit and 2+ on the left side.      Femoral pulses are 2+ on the right side and 2+ on the left side.      Popliteal pulses are 0 on the right side and 0 on the left side.       Dorsalis pedis pulses are 0 on the right side and 0 on the left side.       Posterior tibial pulses are 0 on the right side and 0 on the left side.     Heart sounds: Murmur heard.  No gallop.      Comments: Pacemaker/ICD site is clean dry and intact.  Soft holosystolic murmur heard at the apex. Pulmonary:     Effort: Pulmonary effort is normal. No respiratory distress.     Breath sounds: No stridor. Rales present. No wheezing.     Comments: On portable oxygen. Abdominal:     General: Bowel sounds are normal.     Palpations: Abdomen is soft.  Musculoskeletal:        General: Normal range of motion.     Cervical back: Neck supple.     Right lower leg: Edema present.     Left lower leg: No swelling. Edema present.     Comments: Both of the extremities are wrapped in Kerlix up to the knee.  Unable to evaluate wounds  and lower extremity swelling accurately.  Skin:    General: Skin is warm and dry.  Neurological:     Mental Status: She is  alert.    Radiology: No results found.  Laboratory examination:   CMP Latest Ref Rng & Units 11/14/2020 08/29/2020 08/14/2020  Glucose 65 - 99 mg/dL 206(H) 211(H) 230(H)  BUN 8 - 27 mg/dL 16 24 20   Creatinine 0.57 - 1.00 mg/dL 0.93 1.04(H) 0.99  Sodium 134 - 144 mmol/L 145(H) 144 143  Potassium 3.5 - 5.2 mmol/L 3.6 3.9 3.7  Chloride 96 - 106 mmol/L 105 106 109(H)  CO2 20 - 29 mmol/L 27 26 21   Calcium 8.7 - 10.3 mg/dL 9.2 9.3 9.1  Total Protein 6.0 - 8.5 g/dL - - -  Total Bilirubin 0.0 - 1.2 mg/dL - - -  Alkaline Phos 39 - 117 IU/L - - -  AST 0 - 40 IU/L - - -  ALT 0 - 32 IU/L - - -   CBC Latest Ref Rng & Units 08/14/2020 06/17/2019 01/21/2019  WBC 3.4 - 10.8 x10E3/uL - 6.7 4.0  Hemoglobin 11.1 - 15.9 g/dL 11.9 13.9 11.1(L)  Hematocrit 34.0 - 46.6 % 34.2 43.7 34.3(L)  Platelets 150 - 450 x10E3/uL - 139(L) 112(L)   Lipid Panel     Component Value Date/Time   CHOL 171 02/07/2020 1524   TRIG 62 02/07/2020 1524   HDL 55 02/07/2020 1524   CHOLHDL 2.9 12/26/2019 1407   CHOLHDL 3.9 03/25/2015 0325   VLDL 23 03/25/2015 0325   LDLCALC 104 (H) 02/07/2020 1524   LDLDIRECT 90 04/08/2012 1414   HEMOGLOBIN A1C Lab Results  Component Value Date   HGBA1C 7.8 (H) 01/20/2019   MPG 177.16 01/20/2019   TSH Recent Labs    11/30/19 1437  TSH 3.100   BNP (last 3 results) No results for input(s): BNP in the last 8760 hours.  ProBNP (last 3 results) Recent Labs    08/14/20 1609 08/29/20 1559 11/14/20 1335  PROBNP 7,991* 3,594* 10,511*   PRN Meds:. Medications Discontinued During This Encounter  Medication Reason  . mupirocin ointment (BACTROBAN) 2 % Patient Preference  . metoCLOPramide (REGLAN) 5 MG tablet Patient Preference  . pantoprazole (PROTONIX) 40 MG tablet Patient Preference  . sacubitril-valsartan (ENTRESTO) 49-51 MG Dose change   Current  Meds  Medication Sig  . alendronate (FOSAMAX) 70 MG tablet Take 1 tablet by mouth daily.  Marland Kitchen ALPRAZolam (XANAX) 0.25 MG tablet Take 1 tablet (0.25 mg total) by mouth as directed. 1/2 tablet r twice daily or one tab at night for anxiety  . aspirin EC 81 MG tablet Take 81 mg by mouth daily.  . Calcium Carb-Cholecalciferol (CALCIUM 600 + D PO) Take 1 tablet by mouth daily.   . cephALEXin (KEFLEX) 500 MG capsule Take 500 mg by mouth 4 (four) times daily.  . clopidogrel (PLAVIX) 75 MG tablet Take 1 tablet (75 mg total) by mouth daily.  . diphenhydrAMINE (BENADRYL) 25 mg capsule Take 25 mg by mouth every 6 (six) hours as needed.  . ezetimibe (ZETIA) 10 MG tablet Take 1 tablet (10 mg total) by mouth daily.  Marland Kitchen Fexofenadine HCl (ALLERGY 24-HR PO) Take by mouth.  Marland Kitchen FLUoxetine (PROZAC) 10 MG capsule Take 2 capsules (20 mg total) by mouth.  . gabapentin (NEURONTIN) 100 MG capsule Take 100 mg by mouth at bedtime.  Marland Kitchen glucose blood test strip 1 each 3 (three) times daily.   . insulin lispro (HUMALOG) 100 UNIT/ML injection Inject 2-6 Units into the skin See admin instructions. For use with V - GO 20 Insulin Delivery Device: inject 6 units subcutaneously three times daily before meals,  inject 2 units at bedtime if eating a bedtime snack  . Investigational - Study Medication Take 1 tablet by mouth daily. Study name: dapagliflozin/metformin ER 10/1000mg  Additional study details: This is a Drug Study medication from Dr. Nadyne Coombes at [facility]. Patient started taking this medication on 04/22/18 and is unsure of how long she is to take this medication for. Per patient, she is on this medication as part of a Heart and Diabetes drug study.  . isosorbide mononitrate (IMDUR) 60 MG 24 hr tablet Take 60 mg by mouth daily.  Marland Kitchen levothyroxine (SYNTHROID, LEVOTHROID) 50 MCG tablet Take 50 mcg by mouth daily before breakfast.   . loperamide (IMODIUM) 2 MG capsule Take by mouth as needed for diarrhea or loose stools.  .  Magnesium Oxide 500 MG TABS TAKE 1 TABLET (500 MG TOTAL) BY MOUTH IN THE MORNING AND AT BEDTIME. (Patient taking differently: Take 500 mg by mouth in the morning, at noon, and at bedtime.)  . nitroGLYCERIN (NITROSTAT) 0.4 MG SL tablet PLACE 1 TABLET UNDER THE TONGUE EVERY 5 MINUTES X 3 DOSES AS NEEDED FOR CHEST PAIN.  Marland Kitchen ondansetron (ZOFRAN) 4 MG tablet Take 4 mg by mouth every 8 (eight) hours as needed for nausea or vomiting.  Glory Rosebush VERIO test strip 1 each 3 (three) times daily.   . rosuvastatin (CRESTOR) 40 MG tablet Take 1 tablet (40 mg total) by mouth at bedtime.  . sacubitril-valsartan (ENTRESTO) 97-103 MG Take 1 tablet by mouth 2 (two) times daily.  Marland Kitchen spironolactone (ALDACTONE) 25 MG tablet Take 25 mg by mouth daily.  . Study - DAPA TIMI 68 - dapagliflozin (FARXIGA) 10 mg or placebo tablet (PI-Dalton McLean) Take 1 tablet by mouth daily.  Marland Kitchen torsemide (DEMADEX) 10 MG tablet TAKE 1 TABLET (10 MG TOTAL) BY MOUTH IN THE MORNING.  Marland Kitchen UNABLE TO FIND Oxygen  Pt takes 2 liters a day - on and off during the day as needed  . vitamin B-12 (CYANOCOBALAMIN) 1000 MCG tablet Take 1,000 mcg by mouth daily.   . Vitamin D, Ergocalciferol, (DRISDOL) 50000 units CAPS capsule Take 50,000 Units by mouth every Thursday.   . [DISCONTINUED] pantoprazole (PROTONIX) 40 MG tablet Take 1 tablet (40 mg total) by mouth 2 (two) times daily. (Patient taking differently: Take 40 mg by mouth daily as needed (acid reflux).)  . [DISCONTINUED] sacubitril-valsartan (ENTRESTO) 49-51 MG Take 1 tablet by mouth 2 (two) times daily.    Cardiac Studies:   Echocardiogram 11/30/2019: LVEF 20%, left ventricular cavity dilated, moderate concentric LVH, severe hypokinetic global wall motion, grade 2 diastolic impairment, elevated left atrial pressure, severely dilated left atrium, mild to moderate AR, moderate to severe MR, moderate TR, moderate pulmonary hypertension RVSP 64 mmHg, elevated right atrial pressure, small pericardial  effusion.  Carotid artery duplex 12/02/2018: No hemodynamically significant arterial disease in the internal carotid artery bilaterally. Minimal plaque noted bilateral carotid arteries. Antegrade right vertebral artery flow. Antegrade left vertebral artery flow.  Coronary angiogram 02/08/2019: Distal left main 20% diffuse disease. Ostial LAD stent shows diffuse 20% in-stent restenosis (3.0 x 15 mm resolute Onyx on 12/29/2017). Mid LAD and mid to distal LAD there are tandem lesions 80% and 95%. Ramus intermediate ostial 95 to 99% stenosis. Previously placed RI stent widely patent ( 2.0 x 12 mm resolute on 04/26/2018). 30 to 40% tandem lesions in the RCA. Cutting Balloon angioplasty of the ramus intermediate ostial high-grade stenosis, 99% reduced to 0% and Cutting Balloon angioplasty of the mid LAD followed by  stenting with 2.5 x 32 mm Synergy DES for high-grade 90% stenosis reduced to 0% and TIMI-3 to TIMI-3 flow maintained in both lesions.  EKG 02/09/19: Sinus bradycardia at rate of 55 bpm, left bundle branch block.  No further analysis.  No significant change from prior EKG 02/08/2019.  Scheduled Remote ICD check 10/24/2020:   There were 0 atrial high rate episodes detected. There were 0 VF episodes and 0 nonsustained ventricular episodes. Health trends do not demonstrate significant abnormality.   Trans-thoracic impedance trends and the Optivol Fluid Index suggests possible fluid accumulation, currently an index of 80 and trending upwards since last remote follow-up on 09/26/20. Battery longevity is 8.8 years. RA pacing is 1.8 %, RV pacing is 2.4 %, and LV pacing is 97.8 %.   Scheduled  In office ICD 11/20/19 Singl e (S)/Dual (D)/BV (M) M Presenting ASBP Pacer dependant: No. Underlying ASVS @75 /min. AP 4%, BP 95.% AMS Episodes 0.   HVR 0.  Longevity 9.4 Years/Voltage. Charge time Gap Inc. Lead measurements: Stable Histogram: Low (L)/normal (N)/high (H)  Normal Patient activity  Low. Thoracic impedance: Does not suggest fluid overload. Has returned to base after recent increase Nov 3 rd week and back to baseline Jan 4th 2021.  Observations: Normal ICD function.  Changes: None.   Assessment:     ICD-10-CM   1. Acute combined systolic and diastolic heart failure (HCC)  V78.46 Basic metabolic panel    Magnesium    Pro b natriuretic peptide (BNP)    sacubitril-valsartan (ENTRESTO) 97-103 MG  2. Ischemic cardiomyopathy  N62.9 Basic metabolic panel    Magnesium    Pro b natriuretic peptide (BNP)  3. Biventricular ICD (implantable cardioverter-defibrillator) in place  Z95.810   4. Coronary artery disease involving native coronary artery of native heart without angina pectoris  I25.10   5. Status post primary angioplasty with coronary stent  Z95.5   6. Mixed hyperlipidemia  E78.2   7. Left bundle branch block  I44.7   8. Essential hypertension  I10   9. On supplemental oxygen therapy  Z99.81   10. Type 2 diabetes mellitus with other circulatory complication, with long-term current use of insulin (HCC)  E11.59    Z79.4   11. Long-term insulin use (HCC)  Z79.4    Recommendations:  Acute combined systolic and diastolic heart failure, stage C, NYHA class III:  Ejection Fraction noted on last 2D Echo  Recommend daily weight check, strict I/O's  Fluid restriction to <2L per day, Na restriction < 1.5g per day.  Medications reconciled.  Patient has gained 13 pounds in the last office visit, complains of orthopnea and lower extremity swelling.  NT proBNP is also elevated compared to prior readings.    Independently reviewed labs from 11/14/2020.  Increase Entresto to 97/103 mg p.o. twice daily.    Continue torsemide 10 mg p.o. daily.  Patient is asked to keep a log of her weight on a daily basis.  She is asked to take extra dose of torsemide if she notices weight gain of 1 pound over 24 hours or 3 pounds over the course of the week.    Check blood work in 1  week to evaluate kidney function, and NT proBNP.    Patient is asked to seek medical attention by going to the hospital if she has worsening shortness of breath, or extremity swelling, or orthopnea.    Most recent device interrogation report reviewed and discussed with the patient.  Ischemic cardiomyopathy: See above  Biventricular ICD in situ:  Most recent interrogation reviewed.  Most recent interrogation report reviewed.   Continue to monitor  Established coronary artery disease with prior angioplasty and stents:  Continue aspirin and Plavix.  Patient does not endorse any evidence of bleeding.  No use of sublingual nitroglycerin tablets since last visit.  Medications reconciled.  Hypomagnesemia: Resolved.  Currently taking magnesium supplements 500 mg p.o. 3 times daily.  Insulin-dependent diabetes mellitus type 2: Currently the primary team. Most recent a1c reviewed.   Long-term insulin use: See above  Mixed hyperlipidemia:  Currently managed by primary care provider.  Recommend an LDL of 70 mg/dL or less.  Reeducated on the importance of lifestyle and dietary changes.  Currently on Crestor 40 mg p.o. nightly and Zetia at 10 mg p.o. daily  Current Outpatient Medications:  .  alendronate (FOSAMAX) 70 MG tablet, Take 1 tablet by mouth daily., Disp: , Rfl:  .  ALPRAZolam (XANAX) 0.25 MG tablet, Take 1 tablet (0.25 mg total) by mouth as directed. 1/2 tablet r twice daily or one tab at night for anxiety, Disp: 30 tablet, Rfl: 0 .  aspirin EC 81 MG tablet, Take 81 mg by mouth daily., Disp: , Rfl:  .  Calcium Carb-Cholecalciferol (CALCIUM 600 + D PO), Take 1 tablet by mouth daily. , Disp: , Rfl:  .  cephALEXin (KEFLEX) 500 MG capsule, Take 500 mg by mouth 4 (four) times daily., Disp: , Rfl:  .  clopidogrel (PLAVIX) 75 MG tablet, Take 1 tablet (75 mg total) by mouth daily., Disp: 90 tablet, Rfl: 1 .  diphenhydrAMINE (BENADRYL) 25 mg capsule, Take 25 mg by mouth every 6 (six)  hours as needed., Disp: , Rfl:  .  ezetimibe (ZETIA) 10 MG tablet, Take 1 tablet (10 mg total) by mouth daily., Disp: 90 tablet, Rfl: 3 .  Fexofenadine HCl (ALLERGY 24-HR PO), Take by mouth., Disp: , Rfl:  .  FLUoxetine (PROZAC) 10 MG capsule, Take 2 capsules (20 mg total) by mouth., Disp: , Rfl:  .  gabapentin (NEURONTIN) 100 MG capsule, Take 100 mg by mouth at bedtime., Disp: , Rfl:  .  glucose blood test strip, 1 each 3 (three) times daily. , Disp: , Rfl:  .  insulin lispro (HUMALOG) 100 UNIT/ML injection, Inject 2-6 Units into the skin See admin instructions. For use with V - GO 20 Insulin Delivery Device: inject 6 units subcutaneously three times daily before meals, inject 2 units at bedtime if eating a bedtime snack, Disp: , Rfl:  .  Investigational - Study Medication, Take 1 tablet by mouth daily. Study name: dapagliflozin/metformin ER 10/1000mg  Additional study details: This is a Drug Study medication from Dr. Nadyne Coombes at [facility]. Patient started taking this medication on 04/22/18 and is unsure of how long she is to take this medication for. Per patient, she is on this medication as part of a Heart and Diabetes drug study., Disp: 1 each, Rfl: PRN .  isosorbide mononitrate (IMDUR) 60 MG 24 hr tablet, Take 60 mg by mouth daily., Disp: , Rfl:  .  levothyroxine (SYNTHROID, LEVOTHROID) 50 MCG tablet, Take 50 mcg by mouth daily before breakfast. , Disp: , Rfl:  .  loperamide (IMODIUM) 2 MG capsule, Take by mouth as needed for diarrhea or loose stools., Disp: , Rfl:  .  Magnesium Oxide 500 MG TABS, TAKE 1 TABLET (500 MG TOTAL) BY MOUTH IN THE MORNING AND AT BEDTIME. (Patient taking differently: Take 500 mg by mouth in the morning, at  noon, and at bedtime.), Disp: 180 tablet, Rfl: 1 .  nitroGLYCERIN (NITROSTAT) 0.4 MG SL tablet, PLACE 1 TABLET UNDER THE TONGUE EVERY 5 MINUTES X 3 DOSES AS NEEDED FOR CHEST PAIN., Disp: 25 tablet, Rfl: 2 .  ondansetron (ZOFRAN) 4 MG tablet, Take 4 mg by mouth  every 8 (eight) hours as needed for nausea or vomiting., Disp: , Rfl:  .  ONETOUCH VERIO test strip, 1 each 3 (three) times daily. , Disp: , Rfl: 5 .  rosuvastatin (CRESTOR) 40 MG tablet, Take 1 tablet (40 mg total) by mouth at bedtime., Disp: 90 tablet, Rfl: 3 .  sacubitril-valsartan (ENTRESTO) 97-103 MG, Take 1 tablet by mouth 2 (two) times daily., Disp: 180 tablet, Rfl: 0 .  spironolactone (ALDACTONE) 25 MG tablet, Take 25 mg by mouth daily., Disp: , Rfl:  .  Study - DAPA TIMI 68 - dapagliflozin (FARXIGA) 10 mg or placebo tablet (PI-Dalton Aundra Dubin), Take 1 tablet by mouth daily., Disp: , Rfl:  .  torsemide (DEMADEX) 10 MG tablet, TAKE 1 TABLET (10 MG TOTAL) BY MOUTH IN THE MORNING., Disp: 90 tablet, Rfl: 1 .  UNABLE TO FIND, Oxygen  Pt takes 2 liters a day - on and off during the day as needed, Disp: , Rfl:  .  vitamin B-12 (CYANOCOBALAMIN) 1000 MCG tablet, Take 1,000 mcg by mouth daily. , Disp: , Rfl:  .  Vitamin D, Ergocalciferol, (DRISDOL) 50000 units CAPS capsule, Take 50,000 Units by mouth every Thursday. , Disp: , Rfl:  .  betamethasone valerate ointment (VALISONE) 0.1 %, Apply 1 application topically 2 (two) times daily. (Patient not taking: Reported on 11/19/2020), Disp: 15 g, Rfl: 1 .  hydrALAZINE (APRESOLINE) 50 MG tablet, Take 1 tablet (50 mg total) by mouth 3 (three) times daily., Disp: 90 tablet, Rfl: 2  Orders Placed This Encounter  Procedures  . Basic metabolic panel  . Magnesium  . Pro b natriuretic peptide (BNP)   --Continue cardiac medications as reconciled in final medication list. --Return in about 2 weeks (around 12/03/2020) for Follow up , heart failure management.. Or sooner if needed. --Continue follow-up with your primary care physician regarding the management of your other chronic comorbid conditions.  Patient's questions and concerns were addressed to her satisfaction. She voices understanding of the instructions provided during this encounter.   This note was  created using a voice recognition software as a result there may be grammatical errors inadvertently enclosed that do not reflect the nature of this encounter. Every attempt is made to correct such errors.  Total time spent: 47 minutes.  Rex Kras, [place], [facility]  Pager: [phone] Office: [phone]

## 2020-11-20 NOTE — Progress Notes (Signed)
Sara Graves ([id number]) , Visit Report for 11/14/2020 SuperBill Details Patient Name: Date of Service: [name] 11/14/2020 Medical Record Number: [id number] Patient Account Number: [id number] Date of Birth/Sex: Treating RN: 21-Jul-1941 (80 y.o. Female) Deon Pilling Primary Care Provider: Roe Coombs Other Clinician: Referring Provider: Treating Provider/Extender: Adolphus Birchwood in Treatment: 1 Diagnosis Coding ICD-10 Codes Code Description I87.2 Venous insufficiency (chronic) (peripheral) E11.622 Type 2 diabetes mellitus with other skin ulcer L97.812 Non-pressure chronic ulcer of other part of right lower leg with fat layer exposed L97.822 Non-pressure chronic ulcer of other part of left lower leg with fat layer exposed I10 Essential (primary) hypertension I50.42 Chronic combined systolic (congestive) and diastolic (congestive) heart failure Z99.81 Dependence on supplemental oxygen Facility Procedures CPT4 Code Description Modifier Quantity [id number] - APPLY UNNA BOOT/PROFO BILATERAL 1 Electronic Signature(s) Signed: 11/14/2020 2:08:48 PM By: Deon Pilling Signed: 11/20/2020 4:34:46 PM By: Worthy Keeler PA-C Entered By: Deon Pilling on 11/14/2020 13:02:50

## 2020-11-21 ENCOUNTER — Other Ambulatory Visit: Payer: Self-pay

## 2020-11-21 ENCOUNTER — Encounter (HOSPITAL_BASED_OUTPATIENT_CLINIC_OR_DEPARTMENT_OTHER): Payer: Medicare PPO | Attending: Physician Assistant | Admitting: Physician Assistant

## 2020-11-21 DIAGNOSIS — Z872 Personal history of diseases of the skin and subcutaneous tissue: Secondary | ICD-10-CM | POA: Insufficient documentation

## 2020-11-21 DIAGNOSIS — Z9981 Dependence on supplemental oxygen: Secondary | ICD-10-CM | POA: Diagnosis not present

## 2020-11-21 DIAGNOSIS — I255 Ischemic cardiomyopathy: Secondary | ICD-10-CM

## 2020-11-21 DIAGNOSIS — I5042 Chronic combined systolic (congestive) and diastolic (congestive) heart failure: Secondary | ICD-10-CM | POA: Diagnosis not present

## 2020-11-21 DIAGNOSIS — I110 Hypertensive heart disease with heart failure: Secondary | ICD-10-CM | POA: Diagnosis not present

## 2020-11-21 DIAGNOSIS — I872 Venous insufficiency (chronic) (peripheral): Secondary | ICD-10-CM | POA: Diagnosis not present

## 2020-11-21 DIAGNOSIS — Z09 Encounter for follow-up examination after completed treatment for conditions other than malignant neoplasm: Secondary | ICD-10-CM | POA: Diagnosis present

## 2020-11-21 DIAGNOSIS — I5041 Acute combined systolic (congestive) and diastolic (congestive) heart failure: Secondary | ICD-10-CM

## 2020-11-21 NOTE — Progress Notes (Addendum)
Sara Graves ([id number]) , Visit Report for 11/21/2020 Chief Complaint Document Details Patient Name: Date of Service: Sara Graves 11/21/2020 12:45 PM Medical Record Number: [id number] Patient Account Number: [id number] Date of Birth/Sex: Treating RN: 1941/10/07 (80 y.o. Sara Graves Primary Care Provider: Roe Graves Other Clinician: Referring Provider: Treating Provider/Extender: Sara Graves in Treatment: 2 Information Obtained from: Patient Chief Complaint Bilateral LE Ulcers Electronic Signature(s) Signed: 11/21/2020 1:30:49 PM By: Worthy Keeler PA-C Entered By: Worthy Graves on 11/21/2020 13:30:48 -------------------------------------------------------------------------------- HPI Details Patient Name: Date of Service: Sara Sara Graves 11/21/2020 12:45 PM Medical Record Number: [id number] Patient Account Number: [id number] Date of Birth/Sex: Treating RN: [birth year] (80 y.o. Sara Graves Primary Care Provider: Roe Graves Other Clinician: Referring Provider: Treating Provider/Extender: Sara Graves in Treatment: 2 History of Present Illness HPI Description: 11/07/2020 upon evaluation today patient presents for evaluation here in our clinic concerning issues she has been having with swelling and wounds on the bilateral lower extremities which began about 1 month ago roughly. She tells me she has been using mupirocin over the area ischial also placed on clindamycin by the emergency department when she went. Unfortunately her son has just been admitted to the hospital at [facility] in [place] and she is somewhat distraught about that I can tell as she talks about it she begins to weep. With that being said I understand she is under a lot of stress and this is probably not helping things out at all either. With that being said I do believe that the clindamycin is probably good she tells me things do seem  to be doing a little bit better since she started clindamycin this is good news. No fevers, chills, nausea, vomiting, or diarrhea. She does have a significant history of venous insufficiency, peripheral vascular disease, diabetes mellitus type 2, hypertension, congestive heart failure, and she is dependent on supplemental oxygen most of the time. 11/21/2020 on evaluation today patient appears to be completely healed which is great news there is no signs of infection at all at this point. She has no open wounds or drainage. Electronic Signature(s) Signed: 11/21/2020 1:57:15 PM By: Worthy Keeler PA-C Entered By: Worthy Graves on 11/21/2020 13:57:14 -------------------------------------------------------------------------------- Physical Exam Details Patient Name: Date of Service: Sara [name], [name] 11/21/2020 12:45 PM Medical Record Number: [id number] Patient Account Number: [id number] Date of Birth/Sex: Treating RN: 1941/04/24 (80 y.o. Sara Graves Primary Care Provider: Roe Graves Other Clinician: Referring Provider: Treating Provider/Extender: Sara Graves in Treatment: 2 Constitutional Well-nourished and well-hydrated in no acute distress. Respiratory normal breathing without difficulty. Psychiatric this patient is able to make decisions and demonstrates good insight into disease process. Alert and Oriented x 3. pleasant and cooperative. Notes Patient's wound currently showed signs of having completely healed at all locations and overall I am extremely pleased in this regard. There is no sign of infection currently which is great news. Electronic Signature(s) Signed: 11/21/2020 1:57:31 PM By: Worthy Keeler PA-C Entered By: Worthy Graves on 11/21/2020 13:57:30 -------------------------------------------------------------------------------- Physician Orders Details Patient Name: Date of Service: Sara [name], [name] 11/21/2020 12:45 PM Medical Record  Number: [id number] Patient Account Number: [id number] Date of Birth/Sex: Treating RN: 1941/11/14 (80 y.o. Sara Graves, Sara Graves Primary Care Provider: Roe Graves Other Clinician: Referring Provider: Treating Provider/Extender: Sara Graves in Treatment: 2 Verbal / Phone Orders: No Diagnosis  Coding ICD-10 Coding Code Description I87.2 Venous insufficiency (chronic) (peripheral) E11.622 Type 2 diabetes mellitus with other skin ulcer L97.812 Non-pressure chronic ulcer of other part of right lower leg with fat layer exposed L97.822 Non-pressure chronic ulcer of other part of left lower leg with fat layer exposed I10 Essential (primary) hypertension I50.42 Chronic combined systolic (congestive) and diastolic (congestive) heart failure Z99.81 Dependence on supplemental oxygen Discharge From [facility] Services Discharge from [place] [facility] - Lymphedema / SCD / Other Patient to wear own compression stockings every day. Exercise regularly Moisturize legs daily. Compression stocking or Garment 20-30 mm/Hg pressure to: - both legs daily Electronic Signature(s) Signed: 11/21/2020 4:44:38 PM By: Worthy Keeler PA-C Signed: 11/23/2020 4:05:28 PM By: Rhae Hammock RN Entered By: Rhae Hammock on 11/21/2020 13:51:58 -------------------------------------------------------------------------------- Problem List Details Patient Name: Date of Service: Sara [name], [name] 11/21/2020 12:45 PM Medical Record Number: [id number] Patient Account Number: [id number] Date of Birth/Sex: Treating RN: 1941-03-12 (80 y.o. Sara Graves Primary Care Provider: Roe Graves Other Clinician: Referring Provider: Treating Provider/Extender: Sara Graves in Treatment: 2 Active Problems ICD-10 Encounter Code Description Active Date MDM Diagnosis I87.2 Venous insufficiency (chronic) (peripheral) 11/07/2020 No Yes E11.622 Type 2 diabetes mellitus  with other skin ulcer 11/07/2020 No Yes L97.812 Non-pressure chronic ulcer of other part of right lower leg with fat layer 11/07/2020 No Yes exposed L97.822 Non-pressure chronic ulcer of other part of left lower leg with fat layer 11/07/2020 No Yes exposed [place] (primary) hypertension 11/07/2020 No Yes I50.42 Chronic combined systolic (congestive) and diastolic (congestive) heart failure 11/07/2020 No Yes Z99.81 Dependence on supplemental oxygen 11/07/2020 No Yes Inactive Problems Resolved Problems Electronic Signature(s) Signed: 11/21/2020 1:30:40 PM By: Worthy Keeler PA-C Entered By: Worthy Graves on 11/21/2020 13:30:40 -------------------------------------------------------------------------------- Progress Note Details Patient Name: Date of Service: Sara [name], [name] 11/21/2020 12:45 PM Medical Record Number: [id number] Patient Account Number: [id number] Date of Birth/Sex: Treating RN: August 27, 1941 (79 y.o. Sara Graves Primary Care Provider: Roe Graves Other Clinician: Referring Provider: Treating Provider/Extender: Sara Graves in Treatment: 2 Subjective Chief Complaint Information obtained from Patient Bilateral LE Ulcers History of Present Illness (HPI) 11/07/2020 upon evaluation today patient presents for evaluation here in our clinic concerning issues she has been having with swelling and wounds on the bilateral lower extremities which began about 1 month ago roughly. She tells me she has been using mupirocin over the area ischial also placed on clindamycin by the emergency department when she went. Unfortunately her son has just been admitted to the hospital at [facility] in [place] and she is somewhat distraught about that I can tell as she talks about it she begins to weep. With that being said I understand she is under a lot of stress and this is probably not helping things out at all either. With that being said I do  believe that the clindamycin is probably good she tells me things do seem to be doing a little bit better since she started clindamycin this is good news. No fevers, chills, nausea, vomiting, or diarrhea. She does have a significant history of venous insufficiency, peripheral vascular disease, diabetes mellitus type 2, hypertension, congestive heart failure, and she is dependent on supplemental oxygen most of the time. 11/21/2020 on evaluation today patient appears to be completely healed which is great news there is no signs of infection at all at this point. She has no open wounds or  drainage. Objective Constitutional Well-nourished and well-hydrated in no acute distress. Vitals Time Taken: 1:10 PM, Height: 63 in, Weight: 167 lbs, BMI: 29.6, Temperature: 97.8 F, Pulse: 78 bpm, Respiratory Rate: 18 breaths/min, Blood Pressure: 138/76 mmHg. Respiratory normal breathing without difficulty. Psychiatric this patient is able to make decisions and demonstrates good insight into disease process. Alert and Oriented x 3. pleasant and cooperative. General Notes: Patient's wound currently showed signs of having completely healed at all locations and overall I am extremely pleased in this regard. There is no sign of infection currently which is great news. Integumentary (Hair, Skin) Wound #1 status is Open. Original cause of wound was Blister. The wound is located on the Right,Anterior Lower Leg. The wound measures 0cm length x 0cm width x 0cm depth; 0cm^2 area and 0cm^3 volume. There is no tunneling or undermining noted. There is a none present amount of drainage noted. There is no granulation within the wound bed. There is no necrotic tissue within the wound bed. Wound #2 status is Healed - Epithelialized. Original cause of wound was Blister. The wound is located on the Left,Anterior Lower Leg. The wound measures 0cm length x 0cm width x 0cm depth; 0cm^2 area and 0cm^3 volume. There is no tunneling or  undermining noted. There is a none present amount of drainage noted. There is no granulation within the wound bed. There is no necrotic tissue within the wound bed. Wound #3 status is Healed - Epithelialized. Original cause of wound was Blister. The wound is located on the Right,Medial Lower Leg. The wound measures 0cm length x 0cm width x 0cm depth; 0cm^2 area and 0cm^3 volume. There is no tunneling or undermining noted. There is a none present amount of drainage noted. There is no granulation within the wound bed. There is no necrotic tissue within the wound bed. 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 Exercise regularly Moisturize legs daily. Compression stocking or Garment 20-30 mm/Hg pressure to: - both legs daily 1. I would recommend currently that we went and continue with the wound care measures as before and the patient is in agreement with that plan. This includes the use of compression therapy will give her measurements and information to contact elastic therapy today. 2. I would recommend she continue taking her fluid pills, elevate her legs, and try as much as possible to keep the edema down in her legs which should prevent this type of blistering insulin in the future. We will see the patient back for follow-up visit as needed. Electronic Signature(s) Signed: 11/21/2020 1:58:09 PM By: Worthy Keeler PA-C Entered By: Worthy Graves on 11/21/2020 13:58:09 -------------------------------------------------------------------------------- SuperBill Details Patient Name: Date of Service: Sara [name], [place]. 11/21/2020 Medical Record Number: [id number] Patient Account Number: [id number] Date of Birth/Sex: Treating RN: 1941-09-12 (80 y.o. Sara Graves, Sara Graves Primary Care Provider: Roe Graves Other Clinician: Referring Provider: Treating Provider/Extender: Sara Graves in Treatment: 2 Diagnosis Coding ICD-10 Codes Code Description [phone] Venous insufficiency (chronic) (peripheral) E11.622 Type 2 diabetes mellitus with  other skin ulcer L97.812 Non-pressure chronic ulcer of other part of right lower leg with fat layer exposed L97.822 Non-pressure chronic ulcer of other part of left lower leg with fat layer exposed I10 Essential (primary) hypertension I50.42 Chronic combined systolic (congestive) and diastolic (congestive) heart failure Z99.81 Dependence on supplemental oxygen Facility Procedures CPT4 Code: [id number] Description: 99213 - WOUND CARE VISIT-LEV 3 EST PT Modifier: Quantity: 1 Physician Procedures : CPT4 Code Description Modifier [id number] - WC PHYS LEVEL 3 - EST PT ICD-10 Diagnosis Description I87.2 Venous insufficiency (chronic) (peripheral) E11.622 Type 2 diabetes mellitus with other skin ulcer L97.812 Non-pressure chronic ulcer of other  part of right lower leg with fat layer exposed L97.822 Non-pressure chronic ulcer of other part of left lower leg with fat layer exposed Quantity: 1 Electronic Signature(s) Signed: 11/21/2020 1:58:21 PM By: Worthy Keeler PA-C Entered By: Worthy Graves on 11/21/2020 13:58:20

## 2020-11-23 NOTE — Progress Notes (Signed)
Sara Graves ([id number]) , Visit Report for 11/21/2020 Arrival Information Details Patient Name: Date of Service: [place] Sara Graves 11/21/2020 12:45 PM Medical Record Number: [id number] Patient Account Number: [id number] Date of Birth/Sex: Treating RN: 03/01/1941 (80 y.o. Sara Graves, Sara Graves Primary Care Maxtyn Nuzum: Roe Coombs Other Clinician: Referring Erin Uecker: Treating Pranika Finks/Extender: Adolphus Birchwood in Treatment: 2 Visit Information History Since Last Visit Added or deleted any medications: No Patient Arrived: Gilford Rile Any new allergies or adverse reactions: No Arrival Time: 13:07 Had a fall or experienced change in No Accompanied By: self activities of daily living that may affect Transfer Assistance: None risk of falls: Patient Identification Verified: Yes Signs or symptoms of abuse/neglect since last visito No Secondary Verification Process Completed: Yes Hospitalized since last visit: No Patient Requires Transmission-Based Precautions: No Implantable device outside of the clinic excluding No Patient Has Alerts: Yes cellular tissue based products placed in the center Patient Alerts: Patient on Blood Thinner since last visit: Has Dressing in Place as Prescribed: Yes Pain Present Now: No Electronic Signature(s) Signed: 11/23/2020 4:05:28 PM By: Rhae Hammock RN Entered By: Rhae Hammock on 11/21/2020 13:08:11 -------------------------------------------------------------------------------- Clinic Level of Care Assessment Details Patient Name: Date of Service: [place] Sara Graves, [name] 11/21/2020 12:45 PM Medical Record Number: [id number] Patient Account Number: [id number] Date of Birth/Sex: Treating RN: 09/30/1941 (80 y.o. Sara Graves, Sara Graves Primary Care Elesia Pemberton: Roe Coombs Other Clinician: Referring Parrish Bonn: Treating Qualyn Oyervides/Extender: Adolphus Birchwood in Treatment: 2 Clinic Level of Care Assessment Items TOOL 4  Quantity Score X- 1 0 Use when only an EandM is performed on FOLLOW-UP visit ASSESSMENTS - Nursing Assessment / Reassessment X- 1 10 Reassessment of Co-morbidities (includes updates in patient status) X- 1 5 Reassessment of Adherence to Treatment Plan ASSESSMENTS - Wound and Skin A ssessment / Reassessment []  - 0 Simple Wound Assessment / Reassessment - one wound X- 3 5 Complex Wound Assessment / Reassessment - multiple wounds []  - 0 Dermatologic / Skin Assessment (not related to wound area) ASSESSMENTS - Focused Assessment []  - 0 Circumferential Edema Measurements - multi extremities []  - 0 Nutritional Assessment / Counseling / Intervention X- 1 5 Lower Extremity Assessment (monofilament, tuning fork, pulses) []  - 0 Peripheral Arterial Disease Assessment (using hand held doppler) ASSESSMENTS - Ostomy and/or Continence Assessment and Care []  - 0 Incontinence Assessment and Management []  - 0 Ostomy Care Assessment and Management (repouching, etc.) PROCESS - Coordination of Care X - Simple Patient / Family Education for ongoing care 1 15 []  - 0 Complex (extensive) Patient / Family Education for ongoing care X- 1 10 Staff obtains Programmer, systems, Records, T Results / Process Orders est []  - 0 Staff telephones HHA, Nursing Homes / Clarify orders / etc []  - 0 Routine Transfer to another Facility (non-emergent condition) []  - 0 Routine Hospital Admission (non-emergent condition) []  - 0 New Admissions / Biomedical engineer / Ordering NPWT Apligraf, etc. , []  - 0 Emergency Hospital Admission (emergent condition) X- 1 10 Simple Discharge Coordination []  - 0 Complex (extensive) Discharge Coordination PROCESS - Special Needs []  - 0 Pediatric / Minor Patient Management []  - 0 Isolation Patient Management []  - 0 Hearing / Language / Visual special needs []  - 0 Assessment of Community assistance (transportation, D/C planning, etc.) []  - 0 Additional assistance / Altered  mentation []  - 0 Support Surface(s) Assessment (bed, cushion, seat, etc.) INTERVENTIONS - Wound Cleansing / Measurement []  - 0 Simple Wound Cleansing - one wound X-  3 5 Complex Wound Cleansing - multiple wounds X- 1 5 Wound Imaging (photographs - any number of wounds) []  - 0 Wound Tracing (instead of photographs) []  - 0 Simple Wound Measurement - one wound X- 3 5 Complex Wound Measurement - multiple wounds INTERVENTIONS - Wound Dressings []  - 0 Small Wound Dressing one or multiple wounds []  - 0 Medium Wound Dressing one or multiple wounds []  - 0 Large Wound Dressing one or multiple wounds []  - 0 Application of Medications - topical []  - 0 Application of Medications - injection INTERVENTIONS - Miscellaneous []  - 0 External ear exam []  - 0 Specimen Collection (cultures, biopsies, blood, body fluids, etc.) []  - 0 Specimen(s) / Culture(s) sent or taken to Lab for analysis []  - 0 Patient Transfer (multiple staff / Civil Service fast streamer / Similar devices) []  - 0 Simple Staple / Suture removal (25 or less) []  - 0 Complex Staple / Suture removal (26 or more) []  - 0 Hypo / Hyperglycemic Management (close monitor of Blood Glucose) []  - 0 Ankle / Brachial Index (ABI) - do not check if billed separately X- 1 5 Vital Signs Has the patient been seen at the hospital within the last three years: Yes Total Score: 110 Level Of Care: New/Established - Level 3 Electronic Signature(s) Signed: 11/23/2020 4:05:28 PM By: Rhae Hammock RN Entered By: Rhae Hammock on 11/21/2020 13:53:25 -------------------------------------------------------------------------------- Encounter Discharge Information Details Patient Name: Date of Service: MO Sara Graves, [name] 11/21/2020 12:45 PM Medical Record Number: [id number] Patient Account Number: [id number] Date of Birth/Sex: Treating RN: [birth year] (80 y.o. Sara Graves Primary Care Brendaliz Kuk: Roe Coombs Other Clinician: Referring  Jeanpaul Biehl: Treating Alphonsine Minium/Extender: Adolphus Birchwood in Treatment: 2 Encounter Discharge Information Items Discharge Condition: Stable Ambulatory Status: Walker Discharge Destination: Home Transportation: Private Auto Accompanied By: alone Schedule Follow-up Appointment: Yes Clinical Summary of Care: Patient Declined Electronic Signature(s) Signed: 11/21/2020 5:18:45 PM By: Levan Hurst RN, BSN Entered By: Levan Hurst on 11/21/2020 16:16:10 -------------------------------------------------------------------------------- Lower Extremity Assessment Details Patient Name: Date of Service: MO Sara Graves, [place] 11/21/2020 12:45 PM Medical Record Number: [id number] Patient Account Number: [id number] Date of Birth/Sex: Treating RN: 1941/01/26 (80 y.o. Sara Graves, Sara Graves Primary Care Ladawn Boullion: Roe Coombs Other Clinician: Referring Sherel Fennell: Treating Makaiyah Schweiger/Extender: August Luz Weeks in Treatment: 2 Edema Assessment Assessed: Shirlyn Goltz: Yes] Patrice Paradise: Yes] Edema: [Left: Yes] [Right: Yes] Calf Left: Right: Point of Measurement: 31 cm From Medial Instep 35 cm 35.5 cm Ankle Left: Right: Point of Measurement: 9 cm From Medial Instep 21.5 cm 21.5 cm Vascular Assessment Pulses: Dorsalis Pedis Palpable: [Left:Yes] [Right:Yes] Posterior Tibial Palpable: [Left:Yes] [Right:Yes] Electronic Signature(s) Signed: 11/23/2020 4:05:28 PM By: Rhae Hammock RN Entered By: Rhae Hammock on 11/21/2020 13:16:07 -------------------------------------------------------------------------------- [place] Details Patient Name: Date of Service: MO Sara Graves, [name] 11/21/2020 12:45 PM Medical Record Number: [id number] Patient Account Number: [id number] Date of Birth/Sex: Treating RN: [birth year] (80 y.o. Abrianna Graves Primary Care Kaileigh Viswanathan: Roe Coombs Other Clinician: Referring Atiba Kimberlin: Treating Marliyah Reid/Extender: Adolphus Birchwood in Treatment: 2 Active Inactive Electronic Signature(s) Signed: 11/21/2020 5:18:45 PM By: Levan Hurst RN, BSN Entered By: Levan Hurst on 11/21/2020 16:15:26 -------------------------------------------------------------------------------- Pain Assessment Details Patient Name: Date of Service: MO Sara Graves, [name] 11/21/2020 12:45 PM Medical Record Number: [id number] Patient Account Number: [id number] Date of Birth/Sex: Treating RN: 1941/07/14 (80 y.o. Sara Graves, Sara Graves Primary Care Dondre Catalfamo: Roe Coombs Other Clinician: Referring Travarius Lange: Treating Tysheena Ginzburg/Extender: August Luz  Weeks in Treatment: 2 Active Problems Location of Pain Severity and Description of Pain Patient Has Paino No Site Locations Rate the pain. Rate the pain. Current Pain Level: 0 Pain Management and Medication Current Pain Management: Electronic Signature(s) Signed: 11/23/2020 4:05:28 PM By: Rhae Hammock RN Entered By: Rhae Hammock on 11/21/2020 13:11:19 -------------------------------------------------------------------------------- Patient/Caregiver Education Details Patient Name: Date of Service: MO Sara Graves, NA Eugenio Hoes 1/5/2022andnbsp12:45 PM Medical Record Number: [id number] Patient Account Number: [id number] Date of Birth/Gender: Treating RN: October 01, 1941 (80 y.o. Perline Graves Primary Care Physician: Roe Coombs Other Clinician: Referring Physician: Treating Physician/Extender: Adolphus Birchwood in Treatment: 2 Education Assessment Education Provided To: Patient Education Topics Provided Wound/Skin Impairment: Methods: Explain/Verbal Responses: State content correctly Electronic Signature(s) Signed: 11/21/2020 5:18:45 PM By: Levan Hurst RN, BSN Entered By: Levan Hurst on 11/21/2020 16:15:35 -------------------------------------------------------------------------------- Wound Assessment Details Patient  Name: Date of Service: MO Sara Graves, [place] 11/21/2020 12:45 PM Medical Record Number: [id number] Patient Account Number: [id number] Date of Birth/Sex: Treating RN: 1941/05/31 (80 y.o. 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 Event: Blister Etiology: Date Acquired: 10/26/2020 Wound Open Weeks Of Treatment: 2 Status: Clustered Wound: No Comorbid Glaucoma, Congestive Heart Failure, Coronary Artery Disease, History: Hypertension, Myocardial Infarction, Peripheral Venous Disease, Type II Diabetes, Neuropathy Wound Measurements Length: (cm) Width: (cm) Depth: (cm) Area: (cm) Volume: (cm) 0 % Reduction in Area: 100% 0 % Reduction in Volume: 100% 0 Epithelialization: Large (67-100%) 0 Tunneling: No 0 Undermining: No Wound Description Classification: Full Thickness Without Exposed Support Structures Exudate Amount: None Present Foul Odor After Cleansing: No Slough/Fibrino No Wound Bed Granulation Amount: None Present (0%) Exposed Structure Necrotic Amount: None Present (0%) Fascia Exposed: No Fat Layer (Subcutaneous Tissue) Exposed: No Tendon Exposed: No Muscle Exposed: No Joint Exposed: No Bone Exposed: No Electronic Signature(s) Signed: 11/23/2020 4:05:28 PM By: Rhae Hammock RN Entered By: Rhae Hammock on 11/21/2020 13:50:30 -------------------------------------------------------------------------------- Wound Assessment Details Patient Name: Date of Service: MO Sara Graves, [name] 11/21/2020 12:45 PM Medical Record Number: [id number] Patient Account Number: [id number] Date of Birth/Sex: Treating RN: 1941-07-18 (80 y.o. 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 Event: Blister Etiology: Date Acquired: 10/26/2020 Wound Healed - Epithelialized Weeks Of Treatment: 2 Status: Clustered Wound: No Comorbid Glaucoma, Congestive Heart Failure, Coronary Artery Disease, History: Hypertension, Myocardial Infarction, Peripheral Venous Disease, Type II Diabetes, Neuropathy Wound Measurements Length: (cm) Width: (cm) Depth: (cm) Area: (cm) Volume: (cm) 0 % Reduction in Area: 100% 0 % Reduction in Volume: 100% 0 Epithelialization: Large (67-100%) 0 Tunneling: No 0 Undermining: No Wound Description Classification: Full Thickness Without Exposed Support Structures Exudate Amount: None Present Foul Odor After Cleansing: No Slough/Fibrino No Wound Bed Granulation Amount: None Present (0%) Exposed Structure Necrotic Amount: None Present (0%) Fascia Exposed: No Fat Layer (Subcutaneous Tissue) Exposed: No Tendon Exposed: No Muscle Exposed: No Joint Exposed: No Bone Exposed: No Electronic Signature(s) Signed: 11/23/2020 4:05:28 PM By: Rhae Hammock RN Entered By: Rhae Hammock on 11/21/2020 13:50:43 -------------------------------------------------------------------------------- Wound Assessment Details Patient Name: Date of Service: MO  Sara Graves, [name] 11/21/2020 12:45 PM Medical Record Number: [id number] Patient Account Number: [id number] Date of Birth/Sex: Treating RN: 1941-02-19 (80 y.o. 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 Event: Blister Etiology: Date Acquired: 10/26/2020 Wound Healed - Epithelialized Weeks Of Treatment: 2 Status: Clustered Wound: Yes Comorbid Glaucoma, Congestive Heart Failure, Coronary Artery Disease, History: Hypertension, Myocardial Infarction, Peripheral Venous Disease, Type II Diabetes, Neuropathy Wound Measurements Length: (cm) Width: (cm) Depth: (cm) Area: (cm) Volume: (cm) 0 % Reduction in Area: 100% 0 % Reduction in Volume: 100% 0 Epithelialization: Large (67-100%) 0 Tunneling: No 0 Undermining: No Wound Description Classification: Full Thickness Without Exposed Support Structures Exudate Amount: None Present Foul Odor After Cleansing: No Slough/Fibrino No Wound Bed Granulation Amount: None Present (0%) Exposed Structure Necrotic Amount: None Present (0%) Fascia Exposed: No Fat Layer (Subcutaneous Tissue) Exposed: No Tendon Exposed: No Muscle Exposed: No Joint Exposed: No Bone Exposed: No Electronic Signature(s) Signed: 11/23/2020 4:05:28 PM By: Rhae Hammock RN Entered By: Rhae Hammock on 11/21/2020 13:50:57 -------------------------------------------------------------------------------- [place] Details Patient Name: Date of Service: [place] Sara Graves, [name] 11/21/2020 12:45 PM Medical Record Number: [id number] Patient Account Number: [id number] Date of Birth/Sex: Treating RN: March 15, 1941 (80 y.o. Sara Graves, Sara Graves Primary Care Shandi Godfrey: Roe Coombs Other Clinician: Referring Jacoya Bauman: Treating Amandeep Nesmith/Extender: Adolphus Birchwood in Treatment: 2 Vital Signs Time Taken: 13:10 Temperature (F): 97.8 Height (in): 63 Pulse (bpm): 78 Weight (lbs): 167 Respiratory Rate (breaths/min): 18 Body Mass Index (BMI): 29.6 Blood Pressure (mmHg): 138/76 Reference Range: 80 - 120 mg / dl Electronic Signature(s) Signed: 11/23/2020 4:05:28 PM  By: Rhae Hammock RN Entered By: Rhae Hammock on 11/21/2020 13:11:08

## 2020-11-27 ENCOUNTER — Other Ambulatory Visit: Payer: Self-pay | Admitting: Cardiology

## 2020-11-27 DIAGNOSIS — I5041 Acute combined systolic (congestive) and diastolic (congestive) heart failure: Secondary | ICD-10-CM

## 2020-11-28 ENCOUNTER — Other Ambulatory Visit: Payer: Self-pay | Admitting: Cardiology

## 2020-11-28 DIAGNOSIS — I5042 Chronic combined systolic (congestive) and diastolic (congestive) heart failure: Secondary | ICD-10-CM

## 2020-11-29 ENCOUNTER — Telehealth: Payer: Self-pay | Admitting: Cardiology

## 2020-11-29 NOTE — Telephone Encounter (Signed)
Please call the patient and make sure she is not having HF symptoms (shortness of breath, orthopnea, paroxysmal nocturnal dyspnea or lower extremity swelling). We did go up on medications at the last office visit.

## 2020-11-29 NOTE — Telephone Encounter (Signed)
Spoke with patient, no she has not had any HF symptoms. She knows she has upcoming appointment, but son just got placed in hospice so she will try her hardest to make that appointment, if not she will reschedule.

## 2020-11-30 NOTE — Telephone Encounter (Signed)
Okay, please have her call us if she develops symptoms.

## 2020-12-06 ENCOUNTER — Ambulatory Visit: Payer: Medicare PPO | Admitting: Cardiology

## 2021-01-02 ENCOUNTER — Telehealth: Payer: Self-pay | Admitting: Cardiology

## 2021-01-03 ENCOUNTER — Ambulatory Visit: Payer: Medicare PPO | Admitting: Cardiology

## 2021-01-04 ENCOUNTER — Telehealth: Payer: Self-pay

## 2021-01-04 NOTE — Telephone Encounter (Signed)
Patient called that she has been having chest tightness/pain right where her defibrillator is patient would like to speak to you regarding her symptoms please advise

## 2021-01-04 NOTE — Telephone Encounter (Signed)
Called the patient back she is doing well.  She has walked around without any chest pain since the phone call this morning.  Reviewed the AICD transmission report. No arrhythmia, No shocks, No ATP.  Patient updated.   Rex Kras, [place], [facility]  Pager: [phone] Office: [phone]

## 2021-01-04 NOTE — Telephone Encounter (Signed)
Patient called the office because she is having chest discomfort around her AICD site.   Patient states that she woke up at 3 AM complaining of discomfort over the left anterior chest wall around AICD site.  The discomfort is 8 out of 10, ache-like sensation, has taken sublingual nitroglycerin tablets x2 which has helped her symptoms, the pain is brought on by effort related activities and does improve with resting.  She states that the symptoms that she is having are not her anginal equivalent.  Patient has been under a lot of stress as she is grieving the loss of her second son and making arrangements for his memorial service.  Blood pressure before the telephone encounter was 140/70.   Her weight today is 170 pounds which has improved since last office visit.  Patient has not been treated by her defibrillator.  I have asked her to transmit a reading for her defibrillator.   Patient's chest pain even though not her anginal equivalent is concerning for angina pectoris.   Given her complex cardiac history, and precordial pain, she is recommended to go to the nearest ER for further evaluation.  She verbalizes understanding. Of note, she did not want to come in for an office visit today.

## 2021-01-04 NOTE — Telephone Encounter (Signed)
Called and spoke with patient regarding her remote ICD check results.

## 2021-01-05 ENCOUNTER — Other Ambulatory Visit: Payer: Self-pay | Admitting: Cardiology

## 2021-01-05 DIAGNOSIS — I5041 Acute combined systolic (congestive) and diastolic (congestive) heart failure: Secondary | ICD-10-CM

## 2021-01-09 ENCOUNTER — Encounter: Payer: Medicare PPO | Admitting: Cardiology

## 2021-01-09 ENCOUNTER — Other Ambulatory Visit: Payer: Self-pay

## 2021-01-09 ENCOUNTER — Encounter: Payer: Self-pay | Admitting: Cardiology

## 2021-01-09 VITALS — BP 160/70 | HR 78 | Temp 98.3°F | Resp 17 | Ht 62.0 in | Wt 172.0 lb

## 2021-01-09 DIAGNOSIS — L0390 Cellulitis, unspecified: Secondary | ICD-10-CM | POA: Insufficient documentation

## 2021-01-09 NOTE — Progress Notes (Signed)
Follow up visit  Subjective:   Sara Graves, female    DOB: 1941/10/15, 80 y.o.   MRN: [id number]   HPI  Chief Complaint  Patient presents with  . Research visit     80 y/o Caucasian female with hypertension, uncontrolled type 2 DM, CAD s/p prior PCI's,HFrEF s/p ICD placement, h/o RLE cellulitis  Patient was here today for exact visit from the diuresis trial for dermographism versus placebo.  During her routine physical exam, I noted some significant abnormalities.  On further questioning, I realized the patient has not had any regular follow-up for an injury on her left foot great toe.  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  She was treated grieving from the same.  In the process, she has had some ups and downs with her own health.  She tells me that her dog bit her left foot great toe.  Since then, she developed an injury, which she is resting with a regular Band-Aid.  She has not had any follow-up with her podiatrist for the same.  She also has not seen her PCP/endocrinology for diabetes management.  She does have history of right leg cellulitis in the past.  She denies any fevers, chills.  Current Outpatient Medications on File Prior to Visit  Medication Sig Dispense Refill  . alendronate (FOSAMAX) 70 MG tablet Take 1 tablet by mouth daily.    Marland Kitchen ALPRAZolam (XANAX) 0.25 MG tablet Take 1 tablet (0.25 mg total) by mouth as directed. 1/2 tablet r twice daily or one tab at night for anxiety 30 tablet 0  . aspirin EC 81 MG tablet Take 81 mg by mouth daily.    . betamethasone valerate ointment (VALISONE) 0.1 % Apply 1 application topically 2 (two) times daily. 15 g 1  . busPIRone (BUSPAR) 5 MG tablet Take 5 mg by mouth 2 (two) times daily. As needed    . Calcium Carb-Cholecalciferol (CALCIUM 600 + D PO) Take 1 tablet by mouth daily.     . clopidogrel (PLAVIX) 75 MG tablet Take 1 tablet (75 mg total) by mouth daily. 90 tablet 1  . diphenhydrAMINE  (BENADRYL) 25 mg capsule Take 25 mg by mouth every 6 (six) hours as needed.    . ezetimibe (ZETIA) 10 MG tablet Take 1 tablet (10 mg total) by mouth daily. 90 tablet 3  . Fexofenadine HCl (ALLERGY 24-HR PO) Take by mouth.    Marland Kitchen FLUoxetine (PROZAC) 10 MG capsule Take 2 capsules (20 mg total) by mouth.    . gabapentin (NEURONTIN) 100 MG capsule Take 100 mg by mouth at bedtime.    Marland Kitchen glucose blood test strip 1 each 3 (three) times daily.     . hydrALAZINE (APRESOLINE) 50 MG tablet Take 1 tablet (50 mg total) by mouth 3 (three) times daily. 90 tablet 2  . insulin lispro (HUMALOG) 100 UNIT/ML injection Inject 2-6 Units into the skin See admin instructions. For use with V - GO 20 Insulin Delivery Device: inject 6 units subcutaneously three times daily before meals, inject 2 units at bedtime if eating a bedtime snack    . isosorbide mononitrate (IMDUR) 60 MG 24 hr tablet Take 60 mg by mouth daily.    Marland Kitchen levothyroxine (SYNTHROID, LEVOTHROID) 50 MCG tablet Take 50 mcg by mouth daily before breakfast.     . loperamide (IMODIUM) 2 MG capsule Take by mouth as needed for diarrhea or loose stools.    . Magnesium Oxide 500  MG TABS TAKE 1 TABLET (500 MG TOTAL) BY MOUTH IN THE MORNING AND AT BEDTIME. (Patient taking differently: Take 500 mg by mouth in the morning, at noon, and at bedtime.) 180 tablet 1  . nitroGLYCERIN (NITROSTAT) 0.4 MG SL tablet PLACE 1 TABLET UNDER THE TONGUE EVERY 5 MINUTES X 3 DOSES AS NEEDED FOR CHEST PAIN. 25 tablet 2  . ondansetron (ZOFRAN) 4 MG tablet Take 4 mg by mouth every 8 (eight) hours as needed for nausea or vomiting.    Glory Rosebush VERIO test strip 1 each 3 (three) times daily.   5  . rosuvastatin (CRESTOR) 40 MG tablet Take 1 tablet (40 mg total) by mouth at bedtime. 90 tablet 3  . sacubitril-valsartan (ENTRESTO) 97-103 MG Take 1 tablet by mouth 2 (two) times daily. 180 tablet 0  . spironolactone (ALDACTONE) 25 MG tablet Take 25 mg by mouth daily.    Marland Kitchen torsemide (DEMADEX) 10 MG  tablet TAKE 1 TABLET (10 MG TOTAL) BY MOUTH IN THE MORNING. 90 tablet 1  . UNABLE TO FIND Oxygen  Pt takes 2 liters a day - on and off during the day as needed    . vitamin B-12 (CYANOCOBALAMIN) 1000 MCG tablet Take 1,000 mcg by mouth daily.     . Vitamin D, Ergocalciferol, (DRISDOL) 50000 units CAPS capsule Take 50,000 Units by mouth every Thursday.     . Investigational - Study Medication Take 1 tablet by mouth daily. Study name: dapagliflozin/metformin ER 10/1000mg Additional study details: This is a Drug Study medication from Dr. Nadyne Coombes at [facility]. Patient started taking this medication on 04/22/18 and is unsure of how long she is to take this medication for. Per patient, she is on this medication as part of a Heart and Diabetes drug study. 1 each PRN  . Study - DAPA TIMI 68 - dapagliflozin (FARXIGA) 10 mg or placebo tablet (PI-Dalton McLean) Take 1 tablet by mouth daily.     No current facility-administered medications on file prior to visit.    Cardiovascular & other pertient studies:   EKG 08/14/2020: Ventricular paced rhythm  Echocardiogram 11/30/2019:  Severely depressed LV systolic function with EF 20%. Left ventricle cavity  is mildly dilated at 5.5 cm. Moderate concentric hypertrophy of the left  ventricle. Severe hypokinetic global wall motion. Severe inferolateral  hypokinesis. Doppler evidence of grade II (pseudonormal) diastolic  dysfunction, elevated LAP. Calculated EF 20%.  Left atrial cavity is severely dilated by volume in 4 chamber views and  measures at 4.4 cm in long axis.  Trileaflet aortic valve. Mild to moderate aortic regurgitation.  Mildly restricted posterior mitral valve leaflet with posteriorly directed  moderate to severe mitral regurgitation. Consider papillary muscle  dysfunction.  Structurally normal tricuspid valve. Moderate tricuspid regurgitation.  Moderate to severe pulmonary hypertension. Estimated pulmonary artery  systolic  pressure is 64 mm Hg with RA pressure estimated at 15 mm Hg. RVSP  measures 64 mmHg.  Small pericardial effusion with clear fluids.  IVC is dilated with poor inspiration collapse consistent with elevated  right atrial pressure.  No significant change from 05/19/2019.   Coronary angiogram 02/08/2019:  Distal left main 20% diffuse disease.  Ostial LAD stent shows diffuse 20% in-stent restenosis (3.0 x 15 mm resolute Onyx on 12/29/2017).  Mid LAD and mid to distal LAD there are tandem lesions 80% and 95%.  [place] intermediate ostial 95 to 99% stenosis.  Previously placed RI stent widely patent ( 2.0 x 12 mm resolute on 04/26/2018).  [place]  to 40% tandem lesions in the RCA. Cutting Balloon angioplasty of the ramus intermediate ostial high-grade stenosis, 99% reduced to 0% and Cutting Balloon angioplasty of the mid LAD followed by stenting with 2.5 x 32 mm Synergy DES for high-grade 90% stenosis reduced to 0% and TIMI-3 to TIMI-3 flow maintained in both lesions.   Recent labs: 11/14/2020: Glucose 206, BUN/Cr 16/0.93. EGFR 69. Na/K 145/3.6.    Review of Systems  Cardiovascular: Negative for chest pain, dyspnea on exertion, leg swelling, palpitations and syncope.  Musculoskeletal:       Right foot great toe injury        Vitals:   01/09/21 1627  BP: (!) 160/70  Pulse: 78  Resp: 17  Temp: 98.3 F (36.8 C)  SpO2: 91%    Body mass index is 31.46 kg/m. Filed Weights   01/09/21 1627  Weight: 172 lb (78 kg)     Objective:   Physical Exam Vitals and nursing note reviewed.  Constitutional:      General: She is not in acute distress. Neck:     Vascular: No JVD.  Cardiovascular:     Rate and Rhythm: Normal rate and regular rhythm.     Pulses:          Dorsalis pedis pulses are 0 on the right side and 2+ on the left side.       Posterior tibial pulses are 0 on the right side and 0 on the left side.     Heart sounds: Normal heart sounds. No murmur heard.     Comments: Left foot great  toe superficial injury with unhealthy granulation tissue.  Poor hygiene, with tuft of hair seen and stuck on the toe underneath a Band-Aid.  Scanty amount of seropurulent discharge noted. Pulmonary:     Effort: Pulmonary effort is normal.     Breath sounds: Normal breath sounds. No wheezing or rales.           Assessment & Recommendations:   80 y/o Caucasian female with hypertension, uncontrolled type 2 DM, CAD s/p prior PCI's,HFrEF s/p ICD placement, h/o RLE cellulitis  Left foot injury: Secondary to dog bite. No foul smelling discharge, but still concerning for cellulitis. Does not appear to involve deep tissue. She does have 2_ Lt DP pulse, so less likely to be ischemic. However if it does not heal, could consider vascular workup. For now, I have recommend her to see her podiatrist ASAP. Also needs to see her PCP ASAP to manage diabetes.   F/u in 4 weeks w/Dr. Desiree Lucy, MD Pager: [phone] Office: [phone]

## 2021-01-10 ENCOUNTER — Encounter: Payer: Medicare PPO | Admitting: Cardiology

## 2021-01-16 ENCOUNTER — Other Ambulatory Visit: Payer: Self-pay

## 2021-01-16 ENCOUNTER — Telehealth: Payer: Self-pay

## 2021-01-16 ENCOUNTER — Ambulatory Visit: Payer: Medicare PPO | Admitting: Podiatry

## 2021-01-16 ENCOUNTER — Ambulatory Visit (INDEPENDENT_AMBULATORY_CARE_PROVIDER_SITE_OTHER): Payer: Medicare PPO

## 2021-01-16 ENCOUNTER — Encounter: Payer: Self-pay | Admitting: Podiatry

## 2021-01-16 DIAGNOSIS — S99922A Unspecified injury of left foot, initial encounter: Secondary | ICD-10-CM

## 2021-01-16 NOTE — Telephone Encounter (Signed)
Spoke to patient per Dr Terri Skains to make sure patient isn't having any swelling, SOB or weight gain due to a pacemaker alert patient stated she isn't having no swelling and no SOB and she has only gain 1 pound

## 2021-01-16 NOTE — Progress Notes (Signed)
Subjective:   [name], female   DOB: 80 y.o.   MRN: [id number]   HPI Patient presents stating her dog traumatized her left big toe and its been draining for 2 weeks and she is worried also about bone injury   ROS      Objective:  Physical Exam  Neurovascular status intact with patient having generalized nail disease bilateral and on the left hallux there is exposed tissue distally secondary to trauma from her dog with moderate swelling to the toe but no proximal edema erythema drainage noted     Assessment:  Traumatized left hallux possibility for bone injury or infection     Plan:  H&P reviewed condition and x-ray and today I recommended soaks open toed shoes and that hopefully will heal uneventfully.  Gave strict instructions of any redness or other pathology were to occur she is to contact us immediately may require antibiotics but I would like to hold off on that if we could  X-rays indicate that there is been no bony trauma secondary to the injury that this patient has sustained

## 2021-01-21 NOTE — Telephone Encounter (Signed)
Scheduled Remote ICD check 01/16/2021:   There were 0 atrial high rate episodes detected. There were 0 VF episodes and 0 nonsustained ventricular episodes. Health trends do not demonstrate significant abnormality.   Trans-thoracic impedance trends and the Optivol Fluid Index suggests a continuation of fluid accumulation, remaining around an index of  >160 since Dec 2021, now at 185.   Battery longevity is 8.6 years. RA pacing is 5.3 %, RV pacing is 0.1 %, and LV pacing is 96.0 %.  Patient had an alert previously, patient has been contacted and she has gained only 1 pound in weight and denied any dyspnea or any new symptoms.  We will continue to watch closely.   Adrian Prows, MD, [facility] 01/21/2021, 10:46 PM Office: [phone] Pager: [phone]

## 2021-01-28 ENCOUNTER — Telehealth: Payer: Self-pay

## 2021-01-28 NOTE — Telephone Encounter (Signed)
[name] can you please check if her device is MRI compatible and guide the patient.  Let me know if you have any questions.

## 2021-01-28 NOTE — Telephone Encounter (Signed)
Spoke with [facility] @ [employer], he checked and her device is compatible. Patient is aware and I will contact [facility] to let them know as patient requested.

## 2021-01-28 NOTE — Telephone Encounter (Signed)
What is her appt date and time for ICD check? Please advise.

## 2021-01-28 NOTE — Telephone Encounter (Signed)
Patient called and asked if she can have an MRI even though she has an [place], please advise?, mri would be of her cspine

## 2021-01-31 ENCOUNTER — Other Ambulatory Visit: Payer: Self-pay

## 2021-01-31 ENCOUNTER — Encounter: Payer: Self-pay | Admitting: Cardiology

## 2021-01-31 ENCOUNTER — Ambulatory Visit: Payer: Medicare PPO | Admitting: Cardiology

## 2021-01-31 VITALS — BP 180/83 | HR 93 | Temp 98.0°F | Resp 16 | Ht 62.0 in | Wt 191.2 lb

## 2021-01-31 DIAGNOSIS — I5041 Acute combined systolic (congestive) and diastolic (congestive) heart failure: Secondary | ICD-10-CM

## 2021-01-31 DIAGNOSIS — I255 Ischemic cardiomyopathy: Secondary | ICD-10-CM

## 2021-01-31 DIAGNOSIS — Z95810 Presence of automatic (implantable) cardiac defibrillator: Secondary | ICD-10-CM

## 2021-01-31 DIAGNOSIS — Z4502 Encounter for adjustment and management of automatic implantable cardiac defibrillator: Secondary | ICD-10-CM

## 2021-01-31 MED ORDER — METOPROLOL SUCCINATE ER 25 MG PO TB24: 50.0000 mg | ORAL_TABLET | Freq: Every day | ORAL | 2 refills | Status: DC

## 2021-01-31 MED ORDER — ISOSORBIDE DINITRATE 30 MG PO TABS
30.0000 mg | ORAL_TABLET | Freq: Three times a day (TID) | ORAL | 2 refills | Status: DC
Start: 2021-01-31 — End: 2021-05-17

## 2021-01-31 NOTE — Progress Notes (Signed)
Primary Physician/Referring:  Selinda Orion  [name], female    DOB: [birth year], 80 y.o.   MRN: [id number]  Chief Complaint  Patient presents with  . Pacemaker Check    ICD   HPI:    Sara Graves  is a 80 y.o. Caucasian female with hypertension, uncontrolled diabetes mellitus, ischemic cardiomyopathy with severe LV systolic dysfunction, underlying left bundle branch block and SP Medtronic BiV ICD implantation on 06/21/2019.  She has multiple PCI to the LAD and ramus intermediate starting from 01/05/2018, latest LAD stent was 02/08/2019 and also underwent cutting balloon angioplasty to ramus intermediate.  She has prior stent to the proximal LAD and mid ramus.  She was initially scheduled for ICD check in the office but has been noticing marked dyspnea on exertion, worsening leg edema.  States that by taking diuretics she does respond and does feel better but reaccumulate fluid.  No chest pain, no dizziness or syncope.  Patient has been extremely depressed with the loss of 2 of her sons one with cancer and recently another child with disability who eventually passed with hospice care.  Past Medical History:  Diagnosis Date  . Acute combined systolic and diastolic heart failure ([place]) 04/25/2018  . Anxiety   . Arthritis    "hands" (12/29/2017)  . Basal cell carcinoma of nose    removed  . Bursitis of left shoulder   . BV ICD Medtronic Claria MRI CRTD 06/21/2019  . Cat scratch fever    Late 90s  . Chronic combined systolic and diastolic CHF (congestive heart failure) ([place]) 06/08/2019  . Coronary artery disease   . Depression   . Diabetes mellitus, type II, insulin dependent ([place])   . Encounter for assessment of implantable cardioverter-defibrillator (ICD) 09/28/2019  . GERD (gastroesophageal reflux disease)   . H. pylori infection 2008 and 1998   treated  . Hyperlipidemia   . Hypertension   . Hypothyroidism   . Low oxygen saturation   . Multinodular goiter    . Rotator cuff tear, left recurrent   . Urge urinary incontinence   . UTI (lower urinary tract infection) 05/2016   Past Surgical History:  Procedure Laterality Date  . APPENDECTOMY  AGE 21  . BACK SURGERY    . BASAL CELL CARCINOMA EXCISION     "nose"  . BIV ICD INSERTION CRT-D N/A 06/21/2019   Procedure: BIV ICD INSERTION CRT-D;  Surgeon: Evans Lance, MD;  Location: [facility];  Service: Cardiovascular;  Laterality: N/A;  . BLADDER NECK SUSPENSION  1970's  . BUNIONECTOMY WITH HAMMERTOE RECONSTRUCTION Bilateral   . CARPAL TUNNEL RELEASE Right 05/2017  . COLONOSCOPY W/ POLYPECTOMY    . CORONARY ANGIOPLASTY WITH STENT PLACEMENT  12/29/2017  . CORONARY BALLOON ANGIOPLASTY N/A 02/08/2019   Procedure: CORONARY BALLOON ANGIOPLASTY;  Surgeon: Adrian Prows, MD;  Location: [facility];  Service: Cardiovascular;  Laterality: N/A;  . CORONARY STENT INTERVENTION N/A 12/29/2017   Procedure: CORONARY STENT INTERVENTION;  Surgeon: Nigel Mormon, MD;  Location: [facility];  Service: Cardiovascular;  Laterality: N/A;  . CORONARY STENT INTERVENTION N/A 04/26/2018   Procedure: CORONARY STENT INTERVENTION;  Surgeon: Nigel Mormon, MD;  Location: [facility];  Service: Cardiovascular;  Laterality: N/A;  . CORONARY STENT INTERVENTION N/A 02/08/2019   Procedure: CORONARY STENT INTERVENTION;  Surgeon: Adrian Prows, MD;  Location: [facility];  Service: Cardiovascular;  Laterality: N/A;  . ESOPHAGOGASTRODUODENOSCOPY ENDOSCOPY    .  FORAMINAL DECOMPRESSION AT L2 TO THE SACRUM  01-05-2008   L2  -  S1  . GANGLION CYST EXCISION Left 01/17/2009   ring finger  . IMPLANTATION PERMANENT SPINAL CORD STIMULATOR  06-15-2008   JUNE 2013--  BATTERY CHANGE  . JOINT REPLACEMENT    . LEFT HEART CATH AND CORONARY ANGIOGRAPHY N/A 04/26/2018   Procedure: LEFT HEART CATH AND CORONARY ANGIOGRAPHY;  Surgeon: Nigel Mormon, MD;  Location: [facility];  Service:  Cardiovascular;  Laterality: N/A;  . LEFT HEART CATH AND CORONARY ANGIOGRAPHY N/A 02/08/2019   Procedure: LEFT HEART CATH AND CORONARY ANGIOGRAPHY;  Surgeon: Adrian Prows, MD;  Location: [facility];  Service: Cardiovascular;  Laterality: N/A;  . LIPOMA EXCISION Right    RIGHT ELBOW  . METATARSAL HEAD EXCISION Right 06/16/2018   Procedure: right 5th metatarsal excision, a mini c-arm;  Surgeon: Trula Slade, DPM;  Location: [facility];  Service: Podiatry;  Laterality: Right;  anesthesia can do block  . RIGHT/LEFT HEART CATH AND CORONARY ANGIOGRAPHY N/A 12/29/2017   Procedure: RIGHT/LEFT HEART CATH AND CORONARY ANGIOGRAPHY;  Surgeon: Nigel Mormon, MD;  Location: [facility];  Service: Cardiovascular;  Laterality: N/A;  . SHOULDER OPEN ROTATOR CUFF REPAIR  09/07/2012   Procedure: ROTATOR CUFF REPAIR SHOULDER OPEN;  Surgeon: Magnus Sinning, MD;  Location: [facility];  Service: Orthopedics;  Laterality: Left;  OPEN ANTERIOR ACROMIONECTOMY AND ROTATOR CUFF REPAIR ON LEFT   . SHOULDER OPEN ROTATOR CUFF REPAIR Left 12/28/2012   Procedure: ROTATOR CUFF REPAIR SHOULDER OPEN;  Surgeon: Magnus Sinning, MD;  Location: [facility];  Service: Orthopedics;  Laterality: Left;  OPEN SHOULDER ROTATOR CUFF REPAIR ON LEFT  WITH ANTERIOR ACROMINECTOMY   . SHOULDER OPEN ROTATOR CUFF REPAIR Right 03/28/2003   RIGHT SHOULDER  DEGENERATIVE AC JOINT AND RC TEAR  . SPINAL CORD STIMULATOR BATTERY EXCHANGE N/A 07/03/2016   Procedure: SPINAL CORD STIMULATOR BATTERY PLACMENT;  Surgeon: Melina Schools, MD;  Location: [facility];  Service: Orthopedics;  Laterality: N/A;  . TONSILLECTOMY  AGE 63  . 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 VAGINAL HYSTERECTOMY  1970's   Family History  Problem Relation Age of Onset  . Cancer Mother        breast  . Heart Problems Mother   . Prostate cancer Father   . Muscular dystrophy Son   . Lung cancer Son   . Cancer  Maternal Grandmother        breast  . Heart disease Maternal Grandfather   . Breast cancer Sister     Social History   Tobacco Use  . Smoking status: Never Smoker  . Smokeless tobacco: Never Used  Substance Use Topics  . Alcohol use: No   Marital Status: Married  ROS  Review of Systems  Constitutional: Positive for malaise/fatigue and weight gain.  Cardiovascular: Positive for dyspnea on exertion and leg swelling. Negative for chest pain.  Musculoskeletal: Positive for arthritis.  Gastrointestinal: Negative for melena.  Psychiatric/Behavioral: Positive for depression.   Objective  Blood pressure (!) 180/83, pulse 93, temperature 98 F (36.7 C), resp. rate 16, height 5\' 2"  (1.575 m), weight 191 lb 3.2 oz (86.7 kg), SpO2 95 %.  Vitals with BMI 01/31/2021 01/09/2021 11/19/2020  Height 5\' 2"  5\' 2"  -  Weight 191 lbs 3 oz 172 lbs -  BMI 89.38 10.17 -  Systolic 510 258 527  Diastolic 83 70 72  Pulse 93 78 88     Physical Exam Constitutional:      General: She is not in acute distress.    Appearance: She is well-developed. She is ill-appearing.  HENT:     Head: Atraumatic.  Eyes:     Conjunctiva/sclera: Conjunctivae normal.  Neck:     Thyroid: No thyromegaly.     Vascular: No JVD.  Cardiovascular:     Rate and Rhythm: Regular rhythm. Tachycardia present.     Pulses: Intact distal pulses.          Carotid pulses are 2+ on the right side with bruit and 2+ on the left side.      Femoral pulses are 2+ on the right side and 2+ on the left side.      Popliteal pulses are 0 on the right side and 0 on the left side.       Dorsalis pedis pulses are 0 on the right side and 0 on the left side.       Posterior tibial pulses are 0 on the right side and 0 on the left side.     Heart sounds: No murmur heard. Gallop present. S3 sounds present.      Comments: Trace ankle edema. JVD elevated.  Pulmonary:     Effort: Pulmonary effort is normal.     Breath sounds: Normal breath sounds.   Abdominal:     General: Bowel sounds are normal.     Palpations: Abdomen is soft.  Musculoskeletal:        General: Normal range of motion.     Cervical back: Neck supple.  Skin:    General: Skin is warm and dry.  Neurological:     Mental Status: She is alert.    Laboratory examination:   Recent Labs    08/14/20 1619 08/29/20 1559 11/14/20 1334  NA 143 144 145*  K 3.7 3.9 3.6  CL 109* 106 105  CO2 21 26 27   GLUCOSE 230* 211* 206*  BUN 20 24 16   CREATININE 0.99 1.04* 0.93  CALCIUM 9.1 9.3 9.2  GFRNONAA 54* 51* 59*  GFRAA 63 59* 68   CrCl cannot be calculated (Patient's most recent lab result is older than the maximum 21 days allowed.).  CMP Latest Ref Rng & Units 11/14/2020 08/29/2020 08/14/2020  Glucose 65 - 99 mg/dL 206(H) 211(H) 230(H)  BUN 8 - 27 mg/dL 16 24 20   Creatinine 0.57 - 1.00 mg/dL 0.93 1.04(H) 0.99  Sodium 134 - 144 mmol/L 145(H) 144 143  Potassium 3.5 - 5.2 mmol/L 3.6 3.9 3.7  Chloride 96 - 106 mmol/L 105 106 109(H)  CO2 20 - 29 mmol/L 27 26 21   Calcium 8.7 - 10.3 mg/dL 9.2 9.3 9.1  Total Protein 6.0 - 8.5 g/dL - - -  Total Bilirubin 0.0 - 1.2 mg/dL - - -  Alkaline Phos 39 - 117 IU/L - - -  AST 0 - 40 IU/L - - -  ALT 0 - 32 IU/L - - -   CBC Latest Ref Rng & Units 08/14/2020 06/17/2019 01/21/2019  WBC 3.4 - 10.8 x10E3/uL - 6.7 4.0  Hemoglobin 11.1 - 15.9 g/dL 11.9 13.9 11.1(L)  Hematocrit 34.0 - 46.6 % 34.2 43.7 34.3(L)  Platelets 150 - 450 x10E3/uL - 139(L) 112(L)    Lipid Panel Recent Labs    02/07/20 1524  CHOL 171  TRIG 62  LDLCALC 104*  HDL 55   Lipid Panel     Component Value Date/Time  CHOL 171 02/07/2020 1524   TRIG 62 02/07/2020 1524   HDL 55 02/07/2020 1524   CHOLHDL 2.9 12/26/2019 1407   CHOLHDL 3.9 03/25/2015 0325   VLDL 23 03/25/2015 0325   LDLCALC 104 (H) 02/07/2020 1524   LDLDIRECT 90 04/08/2012 1414   LABVLDL 12 02/07/2020 1524     HEMOGLOBIN A1C Lab Results  Component Value Date   HGBA1C 7.8 (H) 01/20/2019    MPG 177.16 01/20/2019   TSH No results for input(s): TSH in the last 8760 hours.  External labs:     Medications and allergies   Allergies  Allergen Reactions  . Rosiglitazone Maleate Anaphylaxis and Swelling  . Morphine Other (See Comments)    SEVERE HYPOTENSION   . Cephalexin Diarrhea  . Sulfa Antibiotics Diarrhea and Nausea And Vomiting  . Tramadol Other (See Comments)    Unknown reaction  . Elavil [Amitriptyline] Nausea Only  . Tramadol-Acetaminophen Rash     Outpatient Medications Prior to Visit  Medication Sig  . alendronate (FOSAMAX) 70 MG tablet Take 1 tablet by mouth daily.  Marland Kitchen ALPRAZolam (XANAX) 0.25 MG tablet Take 1 tablet (0.25 mg total) by mouth as directed. 1/2 tablet r twice daily or one tab at night for anxiety  . aspirin EC 81 MG tablet Take 81 mg by mouth daily.  . betamethasone valerate ointment (VALISONE) 0.1 % Apply 1 application topically 2 (two) times daily.  . busPIRone (BUSPAR) 5 MG tablet Take 5 mg by mouth 2 (two) times daily. As needed  . Calcium Carb-Cholecalciferol (CALCIUM 600 + D PO) Take 1 tablet by mouth daily.   . clindamycin (CLEOCIN) 150 MG capsule Take 150 mg by mouth 3 (three) times daily.  . diphenhydrAMINE (BENADRYL) 25 mg capsule Take 25 mg by mouth every 6 (six) hours as needed.  . ezetimibe (ZETIA) 10 MG tablet Take 1 tablet (10 mg total) by mouth daily.  Marland Kitchen Fexofenadine HCl (ALLERGY 24-HR PO) Take by mouth.  Marland Kitchen FLUoxetine (PROZAC) 10 MG capsule Take 2 capsules (20 mg total) by mouth.  . gabapentin (NEURONTIN) 100 MG capsule Take 100 mg by mouth at bedtime.  Marland Kitchen glucose blood test strip 1 each 3 (three) times daily.   . hydrALAZINE (APRESOLINE) 50 MG tablet Take 1 tablet (50 mg total) by mouth 3 (three) times daily.  . insulin lispro (HUMALOG) 100 UNIT/ML injection Inject 2-6 Units into the skin See admin instructions. For use with V - GO 20 Insulin Delivery Device: inject 6 units subcutaneously three times daily before meals, inject 2  units at bedtime if eating a bedtime snack  . levothyroxine (SYNTHROID, LEVOTHROID) 50 MCG tablet Take 50 mcg by mouth daily before breakfast.   . loperamide (IMODIUM) 2 MG capsule Take by mouth as needed for diarrhea or loose stools.  . Magnesium Oxide 500 MG TABS TAKE 1 TABLET (500 MG TOTAL) BY MOUTH IN THE MORNING AND AT BEDTIME. (Patient taking differently: Take 500 mg by mouth in the morning, at noon, and at bedtime.)  . methocarbamol (ROBAXIN) 500 MG tablet Take 500 mg by mouth 4 (four) times daily.  . nitroGLYCERIN (NITROSTAT) 0.4 MG SL tablet PLACE 1 TABLET UNDER THE TONGUE EVERY 5 MINUTES X 3 DOSES AS NEEDED FOR CHEST PAIN.  Marland Kitchen ondansetron (ZOFRAN) 4 MG tablet Take 4 mg by mouth every 8 (eight) hours as needed for nausea or vomiting.  Glory Rosebush VERIO test strip 1 each 3 (three) times daily.   . rosuvastatin (CRESTOR) 40 MG tablet Take 1  tablet (40 mg total) by mouth at bedtime.  . sacubitril-valsartan (ENTRESTO) 97-103 MG Take 1 tablet by mouth 2 (two) times daily.  Marland Kitchen spironolactone (ALDACTONE) 25 MG tablet Take 25 mg by mouth daily.  Marland Kitchen torsemide (DEMADEX) 10 MG tablet TAKE 1 TABLET (10 MG TOTAL) BY MOUTH IN THE MORNING.  Marland Kitchen UNABLE TO FIND Oxygen  Pt takes 2 liters a day - on and off during the day as needed  . vitamin B-12 (CYANOCOBALAMIN) 1000 MCG tablet Take 1,000 mcg by mouth daily.   . Vitamin D, Ergocalciferol, (DRISDOL) 50000 units CAPS capsule Take 50,000 Units by mouth every Thursday.   . [DISCONTINUED] isosorbide mononitrate (IMDUR) 60 MG 24 hr tablet Take 60 mg by mouth daily.  . clopidogrel (PLAVIX) 75 MG tablet Take 1 tablet (75 mg total) by mouth daily.  . [DISCONTINUED] Investigational - Study Medication Take 1 tablet by mouth daily. Study name: dapagliflozin/metformin ER 10/1000mg  Additional study details: This is a Drug Study medication from Dr. Nadyne Coombes at [facility]. Patient started taking this medication on 04/22/18 and is unsure of how long she is to take this  medication for. Per patient, she is on this medication as part of a Heart and Diabetes drug study.  . [DISCONTINUED] Study - DAPA TIMI 68 - dapagliflozin (FARXIGA) 10 mg or placebo tablet (PI-Dalton McLean) Take 1 tablet by mouth daily.   No facility-administered medications prior to visit.    Radiology:   No results found.   Cardiac Studies:   Carotid artery duplex 12/22/2018: No hemodynamically significant arterial disease in the internal carotid artery bilaterally. Minimal plaque noted bilateral carotid arteries. Antegrade right vertebral artery flow. Antegrade left vertebral artery flow.  Coronary angiogram 02/08/2019: Distal left main 20% diffuse disease. Ostial LAD stent shows diffuse 20% in-stent restenosis (3.0 x 15 mm resolute Onyx on 12/29/2017). Mid LAD and mid to distal LAD there are tandem lesions 80% and 95%. Ramus intermediate ostial 95 to 99% stenosis. Previously placed RI stent widely patent ( 2.0 x 12 mm resolute on 04/26/2018). 30 to 40% tandem lesions in the RCA. Cutting Balloon angioplasty of the ramus intermediate ostial high-grade stenosis, 99% reduced to 0% and Cutting Balloon angioplasty of the mid LAD followed by stenting with 2.5 x 32 mm Synergy DES for high-grade 90% stenosis reduced to 0% and TIMI-3 to TIMI-3 flow maintained in both lesions.  Echocardiogram 11/30/2019: LVEF 20%, left ventricular cavity dilated, moderate concentric LVH, severe hypokinetic global wall motion, grade 2 diastolic impairment, elevated left atrial pressure, severely dilated left atrium, mild to moderate AR, moderate to severe MR, moderate TR, moderate pulmonary hypertension RVSP 64 mmHg, elevated right atrial pressure, small pericardial effusion.   Device clinic: ICD Medtronic Claria MRI CRTD 06/21/2019:   Scheduled Remote ICD check 01/16/2021:   There were 0 atrial high rate episodes detected. There were 0 VF episodes and 0 nonsustained ventricular episodes. Health trends do  not demonstrate significant abnormality.   Trans-thoracic impedance trends and the Optivol Fluid Index suggests a continuation of fluid accumulation, remaining around an index of  >160 since Dec 2021, now at 185.   Battery longevity is 8.6 years. RA pacing is 5.3 %, RV pacing is 0.1 %, and LV pacing is 96.0 %.   Scheduled  In office ICD 01/31/21  Single (S)/Dual (D)/BV (M) BV Presenting ASBP Pacer dependant: . Underlying NSR. AP 4%%, BP 100.% AMS Episodes 0. HVR 0. Longevity 8.6  Years/Voltage.  Lead measurements: Stable Histogram: Low (L)/normal (N)/high (H)  Good Patient activity <1 hour. Thoracic impedance: Above baseline since Early De2021 and ongoing  Observations: Normal BV ICD function.  Changes: None  EKG:     EKG 02/09/19: Sinus bradycardia at rate of 55 bpm, left bundle branch block.  No further analysis.  No significant change from prior EKG 02/08/2019.    Assessment     ICD-10-CM   1. Encounter for assessment of implantable cardioverter-defibrillator (ICD)  Z45.02   2. Acute combined systolic and diastolic heart failure (HCC)  I50.41 PCV ECHOCARDIOGRAM COMPLETE  3. Biventricular ICD (implantable cardioverter-defibrillator) in place  Z95.810   4. Ischemic cardiomyopathy  I25.5 metoprolol succinate (TOPROL-XL) 25 MG 24 hr tablet     Medications Discontinued During This Encounter  Medication Reason  . Investigational - Study Medication Error  . Study - DAPA TIMI 68 - dapagliflozin (FARXIGA) 10 mg or placebo tablet (PI-Dalton McLean) Error  . isosorbide mononitrate (IMDUR) 60 MG 24 hr tablet Change in therapy    Meds ordered this encounter  Medications  . isosorbide dinitrate (ISORDIL) 30 MG tablet    Sig: Take 1 tablet (30 mg total) by mouth 3 (three) times daily.    Dispense:  90 tablet    Refill:  2    Discontinue Imdur  . metoprolol succinate (TOPROL-XL) 25 MG 24 hr tablet    Sig: Take 2 tablets (50 mg total) by mouth daily. Take with or immediately following  a meal.    Dispense:  60 tablet    Refill:  2   Orders Placed This Encounter  Procedures  . PCV ECHOCARDIOGRAM COMPLETE    Standing Status:   Future    Standing Expiration Date:   01/31/2022    Recommendations:   Sara Graves is a 80 y.o. Caucasian female with hypertension, uncontrolled diabetes mellitus, ischemic cardiomyopathy with severe LV systolic dysfunction, underlying left bundle branch block and SP Medtronic BiV ICD implantation on 06/21/2019.  She has multiple PCI to the LAD and ramus intermediate starting from 01/05/2018, latest LAD stent was 02/08/2019 and also underwent cutting balloon angioplasty to ramus intermediate.  She has prior stent to the proximal LAD and mid ramus.  She came in for ICD check but I ended up seeing her due to complexity of her medical issues.  She is in acute decompensated heart failure since December 2021.  Fortunately no hospitalization.  She has been responding well to diuretic therapy and also on guideline directed medical therapy as well.  She is not on a beta-blocker, will reinitiate beta-blocker therapy at low-dose in view of acute decompensated heart failure.  Her blood pressure is also elevated, will discontinue isosorbide mononitrate and switch her to isosorbide dinitrate 30 mg 3 times daily, she is already on hydralazine 50 mg p.o. 3 times daily.  I will repeat echocardiogram.  I would like to see her back in 3 to 4 weeks for follow-up either with me or with Dr. Terri Skains.  I reviewed the ICD data with the patient, ICD is functioning normally.  I also brought in her husband and spoke to him personally separately from the patient and advised him that her risk of mortality is extremely high if she continues to not follow medical advice especially with regard to her diet.  Husband has been bringing fast foods almost daily and have essentially stopped cooking at home.  Patient is also extremely depressed since she lost 2 of her children in the past 6 months, 1  child with cancer and the second  son with chronic disability and muscular dystrophy and failure to thrive.  I consoled her.  Patient being followed up in the research protocol as noted below. Patient being followed up in the research protocol as noted below.  Patient being followed up in the research protocol as noted below : Deliver (dapagliflozin/metformin ER 10/1000mg  in CHF patient) and the study is completed and last screening visit done.   This was a 40-minute encounter in addition to the ICD evaluation in view of complexity of medical issues.  She would be a good candidate for RPM and PCM follow-up.    Adrian Prows, MD, [facility] 01/31/2021, 6:06 PM Office: [phone]

## 2021-02-07 ENCOUNTER — Ambulatory Visit: Payer: Medicare PPO | Admitting: Cardiology

## 2021-02-11 ENCOUNTER — Emergency Department (HOSPITAL_COMMUNITY): Payer: Medicare PPO

## 2021-02-11 ENCOUNTER — Inpatient Hospital Stay (HOSPITAL_COMMUNITY)
Admission: EM | Admit: 2021-02-11 | Discharge: 2021-02-15 | DRG: 292 | Disposition: A | Discharge status: Home / Self Care | Payer: Medicare PPO | Attending: Internal Medicine | Admitting: Internal Medicine

## 2021-02-11 ENCOUNTER — Telehealth: Payer: Self-pay

## 2021-02-11 ENCOUNTER — Other Ambulatory Visit: Payer: Self-pay | Admitting: Cardiology

## 2021-02-11 ENCOUNTER — Other Ambulatory Visit: Payer: Medicare PPO

## 2021-02-11 ENCOUNTER — Other Ambulatory Visit: Payer: Self-pay

## 2021-02-11 ENCOUNTER — Encounter (HOSPITAL_COMMUNITY): Payer: Self-pay | Admitting: Emergency Medicine

## 2021-02-11 DIAGNOSIS — Z794 Long term (current) use of insulin: Secondary | ICD-10-CM

## 2021-02-11 DIAGNOSIS — F419 Anxiety disorder, unspecified: Secondary | ICD-10-CM | POA: Diagnosis present

## 2021-02-11 DIAGNOSIS — Z95810 Presence of automatic (implantable) cardiac defibrillator: Secondary | ICD-10-CM

## 2021-02-11 DIAGNOSIS — Z79890 Hormone replacement therapy (postmenopausal): Secondary | ICD-10-CM

## 2021-02-11 DIAGNOSIS — Z801 Family history of malignant neoplasm of trachea, bronchus and lung: Secondary | ICD-10-CM

## 2021-02-11 DIAGNOSIS — R0602 Shortness of breath: Secondary | ICD-10-CM | POA: Diagnosis not present

## 2021-02-11 DIAGNOSIS — I255 Ischemic cardiomyopathy: Secondary | ICD-10-CM | POA: Diagnosis present

## 2021-02-11 DIAGNOSIS — IMO0002 Reserved for concepts with insufficient information to code with codable children: Secondary | ICD-10-CM | POA: Diagnosis present

## 2021-02-11 DIAGNOSIS — R791 Abnormal coagulation profile: Secondary | ICD-10-CM | POA: Diagnosis present

## 2021-02-11 DIAGNOSIS — K219 Gastro-esophageal reflux disease without esophagitis: Secondary | ICD-10-CM | POA: Diagnosis present

## 2021-02-11 DIAGNOSIS — Z8042 Family history of malignant neoplasm of prostate: Secondary | ICD-10-CM

## 2021-02-11 DIAGNOSIS — M7989 Other specified soft tissue disorders: Secondary | ICD-10-CM

## 2021-02-11 DIAGNOSIS — I5041 Acute combined systolic (congestive) and diastolic (congestive) heart failure: Secondary | ICD-10-CM

## 2021-02-11 DIAGNOSIS — I5043 Acute on chronic combined systolic (congestive) and diastolic (congestive) heart failure: Secondary | ICD-10-CM | POA: Diagnosis not present

## 2021-02-11 DIAGNOSIS — Z882 Allergy status to sulfonamides status: Secondary | ICD-10-CM

## 2021-02-11 DIAGNOSIS — Z90710 Acquired absence of both cervix and uterus: Secondary | ICD-10-CM

## 2021-02-11 DIAGNOSIS — F411 Generalized anxiety disorder: Secondary | ICD-10-CM

## 2021-02-11 DIAGNOSIS — Z885 Allergy status to narcotic agent status: Secondary | ICD-10-CM

## 2021-02-11 DIAGNOSIS — Z803 Family history of malignant neoplasm of breast: Secondary | ICD-10-CM

## 2021-02-11 DIAGNOSIS — Z79899 Other long term (current) drug therapy: Secondary | ICD-10-CM

## 2021-02-11 DIAGNOSIS — I2510 Atherosclerotic heart disease of native coronary artery without angina pectoris: Secondary | ICD-10-CM | POA: Diagnosis present

## 2021-02-11 DIAGNOSIS — Z888 Allergy status to other drugs, medicaments and biological substances status: Secondary | ICD-10-CM

## 2021-02-11 DIAGNOSIS — D696 Thrombocytopenia, unspecified: Secondary | ICD-10-CM | POA: Diagnosis present

## 2021-02-11 DIAGNOSIS — I447 Left bundle-branch block, unspecified: Secondary | ICD-10-CM | POA: Diagnosis present

## 2021-02-11 DIAGNOSIS — Z7982 Long term (current) use of aspirin: Secondary | ICD-10-CM

## 2021-02-11 DIAGNOSIS — Z20822 Contact with and (suspected) exposure to covid-19: Secondary | ICD-10-CM | POA: Diagnosis present

## 2021-02-11 DIAGNOSIS — N182 Chronic kidney disease, stage 2 (mild): Secondary | ICD-10-CM

## 2021-02-11 DIAGNOSIS — Z85828 Personal history of other malignant neoplasm of skin: Secondary | ICD-10-CM

## 2021-02-11 DIAGNOSIS — E785 Hyperlipidemia, unspecified: Secondary | ICD-10-CM | POA: Diagnosis present

## 2021-02-11 DIAGNOSIS — Z7902 Long term (current) use of antithrombotics/antiplatelets: Secondary | ICD-10-CM

## 2021-02-11 DIAGNOSIS — Z9111 Patient's noncompliance with dietary regimen: Secondary | ICD-10-CM

## 2021-02-11 DIAGNOSIS — N1831 Chronic kidney disease, stage 3a: Secondary | ICD-10-CM | POA: Diagnosis present

## 2021-02-11 DIAGNOSIS — E1122 Type 2 diabetes mellitus with diabetic chronic kidney disease: Secondary | ICD-10-CM | POA: Diagnosis present

## 2021-02-11 DIAGNOSIS — E039 Hypothyroidism, unspecified: Secondary | ICD-10-CM | POA: Diagnosis present

## 2021-02-11 DIAGNOSIS — Z8249 Family history of ischemic heart disease and other diseases of the circulatory system: Secondary | ICD-10-CM

## 2021-02-11 DIAGNOSIS — I2729 Other secondary pulmonary hypertension: Secondary | ICD-10-CM | POA: Diagnosis present

## 2021-02-11 DIAGNOSIS — I130 Hypertensive heart and chronic kidney disease with heart failure and stage 1 through stage 4 chronic kidney disease, or unspecified chronic kidney disease: Secondary | ICD-10-CM | POA: Diagnosis present

## 2021-02-11 DIAGNOSIS — N179 Acute kidney failure, unspecified: Secondary | ICD-10-CM | POA: Diagnosis present

## 2021-02-11 DIAGNOSIS — Z96651 Presence of right artificial knee joint: Secondary | ICD-10-CM | POA: Diagnosis present

## 2021-02-11 DIAGNOSIS — Z9981 Dependence on supplemental oxygen: Secondary | ICD-10-CM

## 2021-02-11 DIAGNOSIS — Z955 Presence of coronary angioplasty implant and graft: Secondary | ICD-10-CM

## 2021-02-11 DIAGNOSIS — N183 Chronic kidney disease, stage 3 unspecified: Secondary | ICD-10-CM

## 2021-02-11 DIAGNOSIS — F32A Depression, unspecified: Secondary | ICD-10-CM | POA: Diagnosis present

## 2021-02-11 DIAGNOSIS — I2781 Cor pulmonale (chronic): Secondary | ICD-10-CM | POA: Diagnosis present

## 2021-02-11 DIAGNOSIS — Z9049 Acquired absence of other specified parts of digestive tract: Secondary | ICD-10-CM

## 2021-02-11 DIAGNOSIS — E1165 Type 2 diabetes mellitus with hyperglycemia: Secondary | ICD-10-CM | POA: Diagnosis present

## 2021-02-11 DIAGNOSIS — I5023 Acute on chronic systolic (congestive) heart failure: Secondary | ICD-10-CM

## 2021-02-11 LAB — CBC
HCT: 41 % (ref 36.0–46.0)
Hemoglobin: 13.1 g/dL (ref 12.0–15.0)
MCH: 31.6 pg (ref 26.0–34.0)
MCHC: 32 g/dL (ref 30.0–36.0)
MCV: 98.8 fL (ref 80.0–100.0)
Platelets: 94 10*3/uL — ABNORMAL LOW (ref 150–400)
RBC: 4.15 MIL/uL (ref 3.87–5.11)
RDW: 14.6 % (ref 11.5–15.5)
WBC: 4.4 10*3/uL (ref 4.0–10.5)
nRBC: 0 % (ref 0.0–0.2)

## 2021-02-11 LAB — RESP PANEL BY RT-PCR (FLU A&B, COVID) ARPGX2
Influenza A by PCR: NEGATIVE
Influenza B by PCR: NEGATIVE
SARS Coronavirus 2 by RT PCR: NEGATIVE

## 2021-02-11 LAB — BASIC METABOLIC PANEL
Anion gap: 6 (ref 5–15)
BUN: 14 mg/dL (ref 8–23)
CO2: 33 mmol/L — ABNORMAL HIGH (ref 22–32)
Calcium: 9.6 mg/dL (ref 8.9–10.3)
Chloride: 104 mmol/L (ref 98–111)
Creatinine, Ser: 0.91 mg/dL (ref 0.44–1.00)
GFR, Estimated: >60 mL/min (ref >60)
Glucose, Bld: 184 mg/dL — ABNORMAL HIGH (ref 70–99)
Potassium: 4.1 mmol/L (ref 3.5–5.1)
Sodium: 143 mmol/L (ref 135–145)

## 2021-02-11 LAB — TROPONIN I (HIGH SENSITIVITY)
Troponin I (High Sensitivity): 8 ng/L (ref <18)
Troponin I (High Sensitivity): 9 ng/L (ref <18)

## 2021-02-11 MED ORDER — FUROSEMIDE 10 MG/ML IJ SOLN
40.0000 mg | Freq: Once | INTRAMUSCULAR | Status: AC
  Administered 2021-02-11: 40 mg via INTRAVENOUS
  Filled 2021-02-11: qty 4

## 2021-02-11 NOTE — ED Provider Notes (Signed)
[facility] EMERGENCY DEPARTMENT Provider Note   CSN: [id number] Arrival date & time: 02/11/21  1728     History Chief Complaint  Patient presents with  . Shortness of Breath  . Leg Swelling  . Chest Pain  . Congestive Heart Failure    Sara Graves is a 80 y.o. female.  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 No fever. She reports today her peripheral swelling became significant worse prompting ED evaluation. She also reports a 4 pound weight gain in the last 10 days. She reports minimal chest pain. No nausea, vomiting.   The history is provided by the patient.  Shortness of Breath Associated symptoms: chest pain   Associated symptoms: no fever and no vomiting   Chest Pain Associated symptoms: shortness of breath and weakness (Generalized)   Associated symptoms: no fever, no nausea and no vomiting   Congestive Heart Failure Associated symptoms include chest pain and shortness of breath.       Past Medical History:  Diagnosis Date  . Acute combined systolic and diastolic heart failure ([place]) 04/25/2018  . Anxiety   . Arthritis    "hands" (12/29/2017)  . Basal cell carcinoma of nose    removed  . Bursitis of left shoulder   . BV ICD Medtronic Claria MRI CRTD 06/21/2019  . Cat scratch fever    Late 90s  . Chronic combined systolic and diastolic CHF (congestive heart failure) ([place]) 06/08/2019  . Coronary artery disease   . Depression   . Diabetes mellitus, type II, insulin dependent ([place])   . Encounter for assessment of implantable cardioverter-defibrillator (ICD) 09/28/2019  . GERD (gastroesophageal reflux  disease)   . H. pylori infection 2008 and 1998   treated  . Hyperlipidemia   . Hypertension   . Hypothyroidism   . Low oxygen saturation   . Multinodular goiter   . Rotator cuff tear, left recurrent   . Urge urinary incontinence   . UTI (lower urinary tract infection) 05/2016    Patient Active Problem List   Diagnosis Date Noted  . Wound cellulitis 01/09/2021  . Encounter for assessment of implantable cardioverter-defibrillator (ICD) 09/28/2019  . Ischemic cardiomyopathy 09/28/2019  . Chronic systolic (congestive) heart failure ([place]) 06/21/2019  . ICD Medtronic Claria MRI CRTD 06/21/2019 06/21/2019  . Nonischemic cardiomyopathy ([place]) 06/17/2019  . Left bundle branch block 06/17/2019  . Chronic combined systolic and diastolic CHF (congestive heart failure) ([place]) 06/08/2019  . Chronic kidney disease (CKD), stage III (moderate) ([place]) 05/12/2019  . Acute respiratory failure with hypoxia ([place]) 01/20/2019  . 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 Anxiety and depression 01/21/2018  . Coronary artery disease involving native coronary artery of native heart without angina pectoris 01/21/2018  . Gastroesophageal reflux disease without esophagitis 01/21/2018  . 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 Dyspnea on exertion 12/27/2017  . Essential hypertension 03/24/2015  . Type 2 diabetes mellitus ([place]) 01/10/2015  . Hyperlipidemia 10/24/2014  . Chronic back pain 05/30/2014  . Skin cancer, basal cell 05/30/2014  . Protein-calorie malnutrition, severe ([place]) 05/16/2014  . Unspecified vitamin D deficiency 05/09/2014  . Osteopenia 04/06/2014  . Neuropathy of leg 04/27/2013  . Weight loss,  abnormal 03/16/2013  . Left rotator cuff tear 12/29/2012  . Baker's cyst 04/09/2012  . UNSPECIFIED VENOUS INSUFFICIENCY 09/20/2010  . Depression 02/04/2007  . Major depressive disorder, single episode 02/04/2007  . Hypothyroidism 01/14/2007  . DM (diabetes mellitus),  type 2, uncontrolled ([place]) 01/14/2007  . HYPERCHOLESTEROLEMIA 01/14/2007  . HYPERTENSION, BENIGN SYSTEMIC 01/14/2007  . RHINITIS, ALLERGIC 01/14/2007  . ARTHRITIS 01/14/2007  . SCIATICA 01/14/2007    Past Surgical History:  Procedure Laterality Date  . APPENDECTOMY  AGE 48  . BACK SURGERY    . BASAL CELL CARCINOMA EXCISION     "nose"  . BIV ICD INSERTION CRT-D N/A 06/21/2019   Procedure: BIV ICD INSERTION CRT-D;  Surgeon: Evans Lance, MD;  Location: [facility];  Service: Cardiovascular;  Laterality: N/A;  . BLADDER NECK SUSPENSION  1970's  . BUNIONECTOMY WITH HAMMERTOE RECONSTRUCTION Bilateral   . CARPAL TUNNEL RELEASE Right 05/2017  . COLONOSCOPY W/ POLYPECTOMY    . CORONARY ANGIOPLASTY WITH STENT PLACEMENT  12/29/2017  . CORONARY BALLOON ANGIOPLASTY N/A 02/08/2019   Procedure: CORONARY BALLOON ANGIOPLASTY;  Surgeon: Adrian Prows, MD;  Location: [facility];  Service: Cardiovascular;  Laterality: N/A;  . CORONARY STENT INTERVENTION N/A 12/29/2017   Procedure: CORONARY STENT INTERVENTION;  Surgeon: Nigel Mormon, MD;  Location: [facility];  Service: Cardiovascular;  Laterality: N/A;  . CORONARY STENT INTERVENTION N/A 04/26/2018   Procedure: CORONARY STENT INTERVENTION;  Surgeon: Nigel Mormon, MD;  Location: [facility];  Service: Cardiovascular;  Laterality: N/A;  . CORONARY STENT INTERVENTION N/A 02/08/2019   Procedure: CORONARY STENT INTERVENTION;  Surgeon: Adrian Prows, MD;  Location: [facility];  Service: Cardiovascular;  Laterality: N/A;  . ESOPHAGOGASTRODUODENOSCOPY ENDOSCOPY    . FORAMINAL DECOMPRESSION AT L2 TO THE SACRUM  01-05-2008   L2  -  S1  . GANGLION CYST EXCISION Left 01/17/2009   ring finger  . IMPLANTATION PERMANENT SPINAL CORD STIMULATOR  06-15-2008   JUNE 2013--  BATTERY CHANGE  . JOINT REPLACEMENT    . LEFT HEART CATH AND CORONARY ANGIOGRAPHY N/A 04/26/2018   Procedure: LEFT HEART CATH AND CORONARY ANGIOGRAPHY;   Surgeon: Nigel Mormon, MD;  Location: [facility];  Service: Cardiovascular;  Laterality: N/A;  . LEFT HEART CATH AND CORONARY ANGIOGRAPHY N/A 02/08/2019   Procedure: LEFT HEART CATH AND CORONARY ANGIOGRAPHY;  Surgeon: Adrian Prows, MD;  Location: [facility];  Service: Cardiovascular;  Laterality: N/A;  . LIPOMA EXCISION Right    RIGHT ELBOW  . METATARSAL HEAD EXCISION Right 06/16/2018   Procedure: right 5th metatarsal excision, a mini c-arm;  Surgeon: Trula Slade, DPM;  Location: [facility];  Service: Podiatry;  Laterality: Right;  anesthesia can do block  . RIGHT/LEFT HEART CATH AND CORONARY ANGIOGRAPHY N/A 12/29/2017   Procedure: RIGHT/LEFT HEART CATH AND CORONARY ANGIOGRAPHY;  Surgeon: Nigel Mormon, MD;  Location: [facility];  Service: Cardiovascular;  Laterality: N/A;  . SHOULDER OPEN ROTATOR CUFF REPAIR  09/07/2012   Procedure: ROTATOR CUFF REPAIR SHOULDER OPEN;  Surgeon: Magnus Sinning, MD;  Location: [facility];  Service: Orthopedics;  Laterality: Left;  OPEN ANTERIOR ACROMIONECTOMY AND ROTATOR CUFF REPAIR ON LEFT   . SHOULDER OPEN ROTATOR CUFF REPAIR Left 12/28/2012   Procedure: ROTATOR CUFF REPAIR SHOULDER OPEN;  Surgeon: Magnus Sinning, MD;  Location: [facility];  Service: Orthopedics;  Laterality: Left;  OPEN SHOULDER ROTATOR CUFF REPAIR ON  LEFT  WITH ANTERIOR ACROMINECTOMY   . SHOULDER OPEN ROTATOR CUFF REPAIR Right 03/28/2003   RIGHT SHOULDER  DEGENERATIVE AC JOINT AND RC TEAR  . SPINAL CORD STIMULATOR BATTERY EXCHANGE N/A 07/03/2016   Procedure: SPINAL CORD STIMULATOR BATTERY PLACMENT;  Surgeon: Melina Schools, MD;  Location: [facility];  Service: Orthopedics;  Laterality: N/A;  . TONSILLECTOMY  AGE 81  . 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 VAGINAL HYSTERECTOMY  1970's     OB History   No obstetric history on file.     Family History  Problem Relation Age of Onset  . Cancer Mother         breast  . Heart Problems Mother   . Prostate cancer Father   . Muscular dystrophy Son   . Lung cancer Son   . Cancer Maternal Grandmother        breast  . Heart disease Maternal Grandfather   . Breast cancer Sister     Social History   Tobacco Use  . Smoking status: Never Smoker  . Smokeless tobacco: Never Used  Vaping Use  . Vaping Use: Never used  Substance Use Topics  . Alcohol use: No  . Drug use: No    Home Medications Prior to Admission medications   Medication Sig Start Date End Date Taking? Authorizing Provider  alendronate (FOSAMAX) 70 MG tablet Take 1 tablet by mouth daily. 06/19/20   [provider]  ALPRAZolam Duanne Moron) 0.25 MG tablet Take 1 tablet (0.25 mg total) by mouth as directed. 1/2 tablet r twice daily or one tab at night for anxiety 02/07/20   Adrian Prows, MD  aspirin EC 81 MG tablet Take 81 mg by mouth daily.    [provider]  betamethasone valerate ointment (VALISONE) 0.1 % Apply 1 application topically 2 (two) times daily. 04/12/20   Hall-Potvin, [place], PA-C  busPIRone (BUSPAR) 5 MG tablet Take 5 mg by mouth 2 (two) times daily. As needed    [provider]  Calcium Carb-Cholecalciferol (CALCIUM 600 + D PO) Take 1 tablet by mouth daily.     [provider]  clindamycin (CLEOCIN) 150 MG capsule Take 150 mg by mouth 3 (three) times daily.    [provider]  clopidogrel (PLAVIX) 75 MG tablet Take 1 tablet (75 mg total) by mouth daily. 06/25/19   Shirley Friar, PA-C  diphenhydrAMINE (BENADRYL) 25 mg capsule Take 25 mg by mouth every 6 (six) hours as needed.    [provider]  ENTRESTO 97-103 MG TAKE 1 TABLET BY MOUTH TWICE A DAY 02/11/21   Tolia, Sunit, DO  ezetimibe (ZETIA) 10 MG tablet Take 1 tablet (10 mg total) by mouth daily. 09/22/19 10/02/20  Miquel Dunn, NP  Fexofenadine HCl (ALLERGY 24-HR PO) Take by mouth.    [provider]  FLUoxetine (PROZAC) 10 MG capsule Take  2 capsules (20 mg total) by mouth.    [provider]  gabapentin (NEURONTIN) 100 MG capsule Take 100 mg by mouth at bedtime.    [provider]  glucose blood test strip 1 each 3 (three) times daily.  12/31/17   [provider]  hydrALAZINE (APRESOLINE) 50 MG tablet Take 1 tablet (50 mg total) by mouth 3 (three) times daily. 09/28/19 10/02/20  Miquel Dunn, NP  insulin lispro (HUMALOG) 100 UNIT/ML injection Inject 2-6 Units into the skin See admin instructions. For use with V - GO 20 Insulin Delivery Device: inject  6 units subcutaneously three times daily before meals, inject 2 units at bedtime if eating a bedtime snack    [provider]  isosorbide dinitrate (ISORDIL) 30 MG tablet Take 1 tablet (30 mg total) by mouth 3 (three) times daily. 01/31/21   Adrian Prows, MD  levothyroxine (SYNTHROID, LEVOTHROID) 50 MCG tablet Take 50 mcg by mouth daily before breakfast.  08/24/12   Clovis Cao, MD  loperamide (IMODIUM) 2 MG capsule Take by mouth as needed for diarrhea or loose stools.    [provider]  Magnesium Oxide 500 MG TABS TAKE 1 TABLET (500 MG TOTAL) BY MOUTH IN THE MORNING AND AT BEDTIME. Patient taking differently: Take 500 mg by mouth in the morning, at noon, and at bedtime. 06/04/20   Tolia, Sunit, DO  methocarbamol (ROBAXIN) 500 MG tablet Take 500 mg by mouth 4 (four) times daily.    [provider]  metoprolol succinate (TOPROL-XL) 25 MG 24 hr tablet Take 2 tablets (50 mg total) by mouth daily. Take with or immediately following a meal. 01/31/21 05/01/21  Adrian Prows, MD  nitroGLYCERIN (NITROSTAT) 0.4 MG SL tablet PLACE 1 TABLET UNDER THE TONGUE EVERY 5 MINUTES X 3 DOSES AS NEEDED FOR CHEST PAIN. 05/24/20   Tolia, Sunit, DO  ondansetron (ZOFRAN) 4 MG tablet Take 4 mg by mouth every 8 (eight) hours as needed for nausea or vomiting.    [provider]  [facility] VERIO test strip 1 each 3 (three) times daily.  06/13/18   [provider]  rosuvastatin (CRESTOR) 40 MG tablet Take 1 tablet (40 mg total) by mouth at bedtime. 03/28/20 10/02/20  Tolia, Sunit, DO  spironolactone (ALDACTONE) 25 MG tablet Take 25 mg by mouth daily.    [provider]  torsemide (DEMADEX) 10 MG tablet TAKE 1 TABLET (10 MG TOTAL) BY MOUTH IN THE MORNING. 10/30/20 11/29/20  Tolia, Sunit, DO  UNABLE TO FIND Oxygen  Pt takes 2 liters a day - on and off during the day as needed    [provider]  vitamin B-12 (CYANOCOBALAMIN) 1000 MCG tablet Take 1,000 mcg by mouth daily.  07/28/18   [provider]  Vitamin D, Ergocalciferol, (DRISDOL) 50000 units CAPS capsule Take 50,000 Units by mouth every Thursday.     [provider]    Allergies    Rosiglitazone maleate, Morphine, Cephalexin, Sulfa antibiotics, Tramadol, Elavil [amitriptyline], and Tramadol-acetaminophen  Review of Systems   Review of Systems  Constitutional: Positive for activity change. Negative for chills and fever.  HENT: Negative.   Respiratory: Positive for shortness of breath.   Cardiovascular: Positive for chest pain and leg swelling.  Gastrointestinal: Negative.  Negative for nausea and vomiting.  Musculoskeletal: Negative.   Skin: Negative.   Neurological: Positive for weakness (Generalized).    Physical Exam Updated Vital Signs BP (!) 104/93   Pulse 72   Temp 98.7 F (37.1 C) (Oral)   Resp 18   SpO2 99%   Physical Exam Vitals and nursing note reviewed.  Constitutional:      General: She is not in acute distress.    Appearance: She is well-developed. She is obese. She is ill-appearing.  Cardiovascular:     Rate and Rhythm: Normal rate and regular rhythm.  Pulmonary:     Breath sounds: Examination of the right-lower field reveals rales. Examination of the left-lower field reveals rales. Rales present.     Comments: Mild dyspnea with full sentences, no distress. Chest:  Chest wall: No tenderness.  Abdominal:      Palpations: Abdomen is soft.  Musculoskeletal:        General: Normal range of motion.     Cervical back: Normal range of motion and neck supple.     Right lower leg: Edema present.     Left lower leg: Edema present.  Skin:    General: Skin is warm and dry.  Neurological:     General: No focal deficit present.     Mental Status: She is alert and oriented to person, place, and time.     ED Results / Procedures / Treatments   Labs (all labs ordered are listed, but only abnormal results are displayed) Labs Reviewed  BASIC METABOLIC PANEL - Abnormal; Notable for the following components:      Result Value   CO2 33 (*)    Glucose, Bld 184 (*)    All other components within normal limits  CBC - Abnormal; Notable for the following components:   Platelets 94 (*)    All other components within normal limits  RESP PANEL BY RT-PCR (FLU A&B, COVID) ARPGX2  BRAIN NATRIURETIC PEPTIDE  TROPONIN I (HIGH SENSITIVITY)  TROPONIN I (HIGH SENSITIVITY)   Results for orders placed or performed during the hospital encounter of 26/94/85  Basic metabolic panel  Result Value Ref Range   Sodium 143 135 - 145 mmol/L   Potassium 4.1 3.5 - 5.1 mmol/L   Chloride 104 98 - 111 mmol/L   CO2 33 (H) 22 - 32 mmol/L   Glucose, Bld 184 (H) 70 - 99 mg/dL   BUN 14 8 - 23 mg/dL   Creatinine, Ser 0.91 0.44 - 1.00 mg/dL   Calcium 9.6 8.9 - 10.3 mg/dL   GFR, Estimated >60 >60 mL/min   Anion gap 6 5 - 15  CBC  Result Value Ref Range   WBC 4.4 4.0 - 10.5 K/uL   RBC 4.15 3.87 - 5.11 MIL/uL   Hemoglobin 13.1 12.0 - 15.0 g/dL   HCT 41.0 36.0 - 46.0 %   MCV 98.8 80.0 - 100.0 fL   MCH 31.6 26.0 - 34.0 pg   MCHC 32.0 30.0 - 36.0 g/dL   RDW 14.6 11.5 - 15.5 %   Platelets 94 (L) 150 - 400 K/uL   nRBC 0.0 0.0 - 0.2 %  Troponin I (High Sensitivity)  Result Value Ref Range   Troponin I (High Sensitivity) 8 <18 ng/L  Troponin I (High Sensitivity)  Result Value Ref Range   Troponin I (High Sensitivity) 9 <18 ng/L     EKG None  Radiology DG Chest 2 View  Result Date: 02/11/2021 CLINICAL DATA:  Shortness of breath, chest pain and leg swelling. EXAM: CHEST - 2 VIEW COMPARISON:  Radiograph 8 of 20 FINDINGS: Multilead left-sided pacemaker in place. Cardiomegaly which is likely increased. Peribronchial and interstitial thickening typical pulmonary edema. There is retrocardiac opacity prior suspect small right pleural effusion. Spinal stimulator in place. No visualized pneumothorax. IMPRESSION: 1. Cardiomegaly with pulmonary edema and small right pleural effusion, findings suspicious for CHF. 2. Retrocardiac opacity with a degree of underlying pleural fluid. There is associated basilar airspace disease which may be atelectasis or pneumonia. Electronically Signed   By: Keith Rake M.D.   On: 02/11/2021 19:08    Procedures Procedures   Medications Ordered in ED Medications - No data to display  ED Course  I have reviewed the triage vital signs and the nursing notes.  Pertinent labs &  imaging results that were available during my care of the patient were reviewed by me and considered in my medical decision making (see chart for details).    MDM Rules/Calculators/A&P                          Patient to ED with SOB/DOE that is progressively worsening over several day, mild chest discomfort, now with increased peripheral edema. H/O CHF c/w presentation.   CXR c/w CHF with pulmonary edema, pleural effusion, CM. No definite infiltrates. BNP pending.   Discussed with [name], primary cardiology, who advises hospitalist can admit and he will see the patient on consultation in the morning. Hospitalist paged for admission.   Final Clinical Impression(s) / ED Diagnoses Final diagnoses:  None   1. Acute CHF exacerbation 2. Chronic s,dCHF   Rx / DC Orders ED Discharge Orders    None       Dennie Bible 02/11/21 [id number]    Elnora Morrison, MD 02/16/21 [id number]

## 2021-02-11 NOTE — ED Triage Notes (Signed)
Pt. Stated, Im having a lot of swelling and Ive not been able to get rid of it. This started this morning. Its also making me SOB, more than usual. In home oxygen on 2 L

## 2021-02-11 NOTE — ED Provider Notes (Incomplete)
I provided a substantive portion of the care of this patient.  I personally performed the entirety of the history, exam and medical decision making for this encounter. {Remember to document shared critical care using "edcritical" dot phrase:1}     

## 2021-02-12 ENCOUNTER — Inpatient Hospital Stay (HOSPITAL_COMMUNITY): Payer: Medicare PPO

## 2021-02-12 DIAGNOSIS — E1165 Type 2 diabetes mellitus with hyperglycemia: Secondary | ICD-10-CM

## 2021-02-12 DIAGNOSIS — I5043 Acute on chronic combined systolic (congestive) and diastolic (congestive) heart failure: Principal | ICD-10-CM

## 2021-02-12 DIAGNOSIS — R609 Edema, unspecified: Secondary | ICD-10-CM | POA: Diagnosis not present

## 2021-02-12 DIAGNOSIS — Z85828 Personal history of other malignant neoplasm of skin: Secondary | ICD-10-CM | POA: Diagnosis not present

## 2021-02-12 DIAGNOSIS — R791 Abnormal coagulation profile: Secondary | ICD-10-CM | POA: Diagnosis present

## 2021-02-12 DIAGNOSIS — I2781 Cor pulmonale (chronic): Secondary | ICD-10-CM | POA: Diagnosis present

## 2021-02-12 DIAGNOSIS — Z95810 Presence of automatic (implantable) cardiac defibrillator: Secondary | ICD-10-CM

## 2021-02-12 DIAGNOSIS — E039 Hypothyroidism, unspecified: Secondary | ICD-10-CM | POA: Diagnosis present

## 2021-02-12 DIAGNOSIS — F32A Depression, unspecified: Secondary | ICD-10-CM

## 2021-02-12 DIAGNOSIS — Z955 Presence of coronary angioplasty implant and graft: Secondary | ICD-10-CM | POA: Diagnosis not present

## 2021-02-12 DIAGNOSIS — I447 Left bundle-branch block, unspecified: Secondary | ICD-10-CM | POA: Diagnosis present

## 2021-02-12 DIAGNOSIS — Z888 Allergy status to other drugs, medicaments and biological substances status: Secondary | ICD-10-CM | POA: Diagnosis not present

## 2021-02-12 DIAGNOSIS — N182 Chronic kidney disease, stage 2 (mild): Secondary | ICD-10-CM | POA: Diagnosis not present

## 2021-02-12 DIAGNOSIS — Z882 Allergy status to sulfonamides status: Secondary | ICD-10-CM | POA: Diagnosis not present

## 2021-02-12 DIAGNOSIS — I255 Ischemic cardiomyopathy: Secondary | ICD-10-CM | POA: Diagnosis present

## 2021-02-12 DIAGNOSIS — I2510 Atherosclerotic heart disease of native coronary artery without angina pectoris: Secondary | ICD-10-CM | POA: Diagnosis present

## 2021-02-12 DIAGNOSIS — F419 Anxiety disorder, unspecified: Secondary | ICD-10-CM | POA: Diagnosis present

## 2021-02-12 DIAGNOSIS — I130 Hypertensive heart and chronic kidney disease with heart failure and stage 1 through stage 4 chronic kidney disease, or unspecified chronic kidney disease: Secondary | ICD-10-CM | POA: Diagnosis present

## 2021-02-12 DIAGNOSIS — I2729 Other secondary pulmonary hypertension: Secondary | ICD-10-CM | POA: Diagnosis present

## 2021-02-12 DIAGNOSIS — Z885 Allergy status to narcotic agent status: Secondary | ICD-10-CM | POA: Diagnosis not present

## 2021-02-12 DIAGNOSIS — Z20822 Contact with and (suspected) exposure to covid-19: Secondary | ICD-10-CM | POA: Diagnosis present

## 2021-02-12 DIAGNOSIS — D696 Thrombocytopenia, unspecified: Secondary | ICD-10-CM | POA: Diagnosis present

## 2021-02-12 DIAGNOSIS — N1831 Chronic kidney disease, stage 3a: Secondary | ICD-10-CM | POA: Diagnosis present

## 2021-02-12 DIAGNOSIS — E785 Hyperlipidemia, unspecified: Secondary | ICD-10-CM | POA: Diagnosis present

## 2021-02-12 DIAGNOSIS — Z96651 Presence of right artificial knee joint: Secondary | ICD-10-CM | POA: Diagnosis present

## 2021-02-12 DIAGNOSIS — K219 Gastro-esophageal reflux disease without esophagitis: Secondary | ICD-10-CM | POA: Diagnosis present

## 2021-02-12 DIAGNOSIS — N179 Acute kidney failure, unspecified: Secondary | ICD-10-CM | POA: Diagnosis present

## 2021-02-12 DIAGNOSIS — R0602 Shortness of breath: Secondary | ICD-10-CM | POA: Diagnosis present

## 2021-02-12 DIAGNOSIS — I5023 Acute on chronic systolic (congestive) heart failure: Secondary | ICD-10-CM | POA: Diagnosis not present

## 2021-02-12 LAB — CBC
HCT: 36.5 % (ref 36.0–46.0)
Hemoglobin: 11.9 g/dL — ABNORMAL LOW (ref 12.0–15.0)
MCH: 32 pg (ref 26.0–34.0)
MCHC: 32.6 g/dL (ref 30.0–36.0)
MCV: 98.1 fL (ref 80.0–100.0)
Platelets: 92 10*3/uL — ABNORMAL LOW (ref 150–400)
RBC: 3.72 MIL/uL — ABNORMAL LOW (ref 3.87–5.11)
RDW: 14.6 % (ref 11.5–15.5)
WBC: 4.1 10*3/uL (ref 4.0–10.5)
nRBC: 0 % (ref 0.0–0.2)

## 2021-02-12 LAB — BRAIN NATRIURETIC PEPTIDE: B Natriuretic Peptide: 4088.9 pg/mL — ABNORMAL HIGH (ref 0.0–100.0)

## 2021-02-12 LAB — HEMOGLOBIN A1C
Hgb A1c MFr Bld: 9.8 % — ABNORMAL HIGH (ref 4.8–5.6)
Mean Plasma Glucose: 234.56 mg/dL

## 2021-02-12 LAB — ECHOCARDIOGRAM COMPLETE
Area-P 1/2: 3.31 cm2
P 1/2 time: 373 msec
S' Lateral: 5.3 cm

## 2021-02-12 LAB — BASIC METABOLIC PANEL
Anion gap: 7 (ref 5–15)
BUN: 12 mg/dL (ref 8–23)
CO2: 31 mmol/L (ref 22–32)
Calcium: 9 mg/dL (ref 8.9–10.3)
Chloride: 103 mmol/L (ref 98–111)
Creatinine, Ser: 0.86 mg/dL (ref 0.44–1.00)
GFR, Estimated: >60 mL/min (ref >60)
Glucose, Bld: 149 mg/dL — ABNORMAL HIGH (ref 70–99)
Potassium: 3.8 mmol/L (ref 3.5–5.1)
Sodium: 141 mmol/L (ref 135–145)

## 2021-02-12 LAB — CBG MONITORING, ED
Glucose-Capillary: 111 mg/dL — ABNORMAL HIGH (ref 70–99)
Glucose-Capillary: 131 mg/dL — ABNORMAL HIGH (ref 70–99)

## 2021-02-12 LAB — GLUCOSE, CAPILLARY
Glucose-Capillary: 170 mg/dL — ABNORMAL HIGH (ref 70–99)
Glucose-Capillary: 162 mg/dL — ABNORMAL HIGH (ref 70–99)

## 2021-02-12 MED ORDER — ALPRAZOLAM 0.25 MG PO TABS: 0.2500 mg | ORAL_TABLET | Freq: Three times a day (TID) | ORAL | Status: DC | PRN

## 2021-02-12 MED ORDER — CLOPIDOGREL BISULFATE 75 MG PO TABS
75.0000 mg | ORAL_TABLET | Freq: Every day | ORAL | Status: DC
  Administered 2021-02-12 – 2021-02-15 (×4): 75 mg via ORAL
  Filled 2021-02-12 (×4): qty 1

## 2021-02-12 MED ORDER — FUROSEMIDE 10 MG/ML IJ SOLN
40.0000 mg | Freq: Every day | INTRAMUSCULAR | Status: DC
  Administered 2021-02-12: 40 mg via INTRAVENOUS
  Filled 2021-02-12: qty 4

## 2021-02-12 MED ORDER — GABAPENTIN 100 MG PO CAPS
100.0000 mg | ORAL_CAPSULE | Freq: Every day | ORAL | Status: DC
  Administered 2021-02-12 – 2021-02-14 (×3): 100 mg via ORAL
  Filled 2021-02-12 (×3): qty 1

## 2021-02-12 MED ORDER — FUROSEMIDE 10 MG/ML IJ SOLN
40.0000 mg | Freq: Two times a day (BID) | INTRAMUSCULAR | Status: DC
  Administered 2021-02-12 – 2021-02-13 (×3): 40 mg via INTRAVENOUS
  Filled 2021-02-12 (×4): qty 4

## 2021-02-12 MED ORDER — DAPAGLIFLOZIN PROPANEDIOL 10 MG PO TABS
10.0000 mg | ORAL_TABLET | Freq: Every day | ORAL | Status: DC
  Administered 2021-02-12 – 2021-02-15 (×4): 10 mg via ORAL
  Filled 2021-02-12 (×5): qty 1

## 2021-02-12 MED ORDER — ASPIRIN EC 81 MG PO TBEC
81.0000 mg | DELAYED_RELEASE_TABLET | Freq: Every day | ORAL | Status: DC
  Administered 2021-02-12 – 2021-02-15 (×4): 81 mg via ORAL
  Filled 2021-02-12 (×4): qty 1

## 2021-02-12 MED ORDER — FLUOXETINE HCL 10 MG PO CAPS
10.0000 mg | ORAL_CAPSULE | Freq: Every day | ORAL | Status: DC
  Administered 2021-02-12 – 2021-02-15 (×4): 10 mg via ORAL
  Filled 2021-02-12 (×4): qty 1

## 2021-02-12 MED ORDER — MAGNESIUM OXIDE 400 (241.3 MG) MG PO TABS
400.0000 mg | ORAL_TABLET | Freq: Three times a day (TID) | ORAL | Status: DC
  Administered 2021-02-12 – 2021-02-15 (×10): 400 mg via ORAL
  Filled 2021-02-12 (×10): qty 1

## 2021-02-12 MED ORDER — METHOCARBAMOL 500 MG PO TABS
500.0000 mg | ORAL_TABLET | Freq: Three times a day (TID) | ORAL | Status: DC
  Administered 2021-02-12 – 2021-02-15 (×14): 500 mg via ORAL
  Filled 2021-02-12 (×14): qty 1

## 2021-02-12 MED ORDER — METOPROLOL SUCCINATE ER 50 MG PO TB24
50.0000 mg | ORAL_TABLET | Freq: Every day | ORAL | Status: DC
  Administered 2021-02-12 – 2021-02-15 (×3): 50 mg via ORAL
  Filled 2021-02-12 (×3): qty 1
  Filled 2021-02-12: qty 2

## 2021-02-12 MED ORDER — SPIRONOLACTONE 25 MG PO TABS
25.0000 mg | ORAL_TABLET | Freq: Every day | ORAL | Status: DC
  Administered 2021-02-12 – 2021-02-15 (×4): 25 mg via ORAL
  Filled 2021-02-12 (×4): qty 1

## 2021-02-12 MED ORDER — HYDRALAZINE HCL 50 MG PO TABS: 50.0000 mg | ORAL_TABLET | Freq: Three times a day (TID) | ORAL | Status: DC

## 2021-02-12 MED ORDER — BUSPIRONE HCL 5 MG PO TABS
5.0000 mg | ORAL_TABLET | Freq: Two times a day (BID) | ORAL | Status: DC
  Administered 2021-02-12 – 2021-02-15 (×7): 5 mg via ORAL
  Filled 2021-02-12 (×9): qty 1

## 2021-02-12 MED ORDER — SACUBITRIL-VALSARTAN 97-103 MG PO TABS
1.0000 | ORAL_TABLET | Freq: Two times a day (BID) | ORAL | Status: DC
  Administered 2021-02-12 – 2021-02-15 (×7): 1 via ORAL
  Filled 2021-02-12 (×9): qty 1

## 2021-02-12 MED ORDER — LEVOTHYROXINE SODIUM 50 MCG PO TABS
50.0000 ug | ORAL_TABLET | Freq: Every day | ORAL | Status: DC
  Administered 2021-02-12 – 2021-02-15 (×4): 50 ug via ORAL
  Filled 2021-02-12 (×4): qty 1

## 2021-02-12 MED ORDER — INSULIN ASPART 100 UNIT/ML ~~LOC~~ SOLN
0.0000 [IU] | Freq: Three times a day (TID) | SUBCUTANEOUS | Status: DC
  Administered 2021-02-12: 1 [IU] via SUBCUTANEOUS
  Administered 2021-02-12 – 2021-02-13 (×2): 2 [IU] via SUBCUTANEOUS
  Administered 2021-02-13 – 2021-02-14 (×2): 3 [IU] via SUBCUTANEOUS
  Administered 2021-02-14: 1 [IU] via SUBCUTANEOUS
  Administered 2021-02-14: 3 [IU] via SUBCUTANEOUS
  Administered 2021-02-15: 2 [IU] via SUBCUTANEOUS
  Administered 2021-02-15: 3 [IU] via SUBCUTANEOUS

## 2021-02-12 MED ORDER — INSULIN ASPART 100 UNIT/ML ~~LOC~~ SOLN
0.0000 [IU] | Freq: Every day | SUBCUTANEOUS | Status: DC
  Administered 2021-02-13: 3 [IU] via SUBCUTANEOUS

## 2021-02-12 MED ORDER — ENOXAPARIN SODIUM 40 MG/0.4ML ~~LOC~~ SOLN: 40.0000 mg | SUBCUTANEOUS | Status: DC

## 2021-02-12 MED ORDER — ISOSORBIDE DINITRATE 30 MG PO TABS
30.0000 mg | ORAL_TABLET | Freq: Three times a day (TID) | ORAL | Status: DC
  Administered 2021-02-12 – 2021-02-15 (×10): 30 mg via ORAL
  Filled 2021-02-12 (×14): qty 1

## 2021-02-12 NOTE — Consult Note (Signed)
CARDIOLOGY CONSULT NOTE  [name] MRN: [id number] DOB/AGE: [birth year] 80 y.o.  Admit date: 02/11/2021 Referring Physician  Arrien, Jimmy Picket, MD Primary Physician:  Selinda Orion Reason for Consultation: shortness of breath, edema  [name], female    DOB: [birth year], 80 y.o.   MRN: [id number]  Chief Complaint  Patient presents with  . Shortness of Breath  . Leg Swelling  . Chest Pain  . Congestive Heart Failure   HPI:    Sara Graves  is a 80 y.o. Caucasian female with hypertension, uncontrolled diabetes mellitus, ischemic cardiomyopathy with severe LV systolic dysfunction, underlying left bundle branch block and SP Medtronic BiV ICD implantation on 06/21/2019.  She has multiple PCI to the LAD and ramus intermediate starting from 01/05/2018, latest LAD stent was 02/08/2019 and also underwent cutting balloon angioplasty to ramus intermediate.  She has prior stent to the proximal LAD and mid ramus.  At baseline patient wears 2 L of oxygen at night and during exertion.  Patient presented to [facility] emergency room yesterday (02/11/2021) with complaints of worsening dyspnea on exertion, orthopnea, and edema over the last 4 days.  She also reported chest discomfort which she describes as tightness. On Friday of last week patient began to experience congestion and cough, which she felt was an upper respiratory infection.  However symptoms progressed and she began to experience significant dyspnea, orthopnea, and edema.   Patient called our outpatient office yesterday with concerns about dyspnea and edema, she was then advised to go to the emergency department.  Evaluation in the emergency department revealed BNP elevated at >4000, and chest x-ray revealing cardiomegaly with pulmonary edema. Serial troponin negative.  Patient started on furosemide IV 40 mg BID.  Patient does admit to dietary noncompliance, particularly since the death of her 2  sons.  Since presentation patient is net negative 1.9L. She remains on 2L/min supplemental oxygen via nasal cannula. She reports both her dyspnea and peripheral edema have improved since admission. Patient reports her left arm was much more swollen yesterday when she presented to the ED.   Past Medical History:  Diagnosis Date  . Acute combined systolic and diastolic heart failure ([place]) 04/25/2018  . Anxiety   . Arthritis    "hands" (12/29/2017)  . Basal cell carcinoma of nose    removed  . Bursitis of left shoulder   . BV ICD Medtronic [place] MRI CRTD 06/21/2019  . Cat scratch fever    Late 90s  . Chronic combined systolic and diastolic CHF (congestive heart failure) ([place]) 06/08/2019  . Coronary artery disease   . Depression   . Diabetes mellitus, type II, insulin dependent ([place])   . Encounter for assessment of implantable cardioverter-defibrillator (ICD) 09/28/2019  . GERD (gastroesophageal reflux disease)   . H. pylori infection 2008 and 1998   treated  . Hyperlipidemia   . Hypertension   . Hypothyroidism   . Low oxygen saturation   . Multinodular goiter   . Rotator cuff tear, left recurrent   . Urge urinary incontinence   . UTI (lower urinary tract infection) 05/2016   Past Surgical History:  Procedure Laterality Date  . APPENDECTOMY  AGE 55  . BACK SURGERY    . BASAL CELL CARCINOMA EXCISION     "nose"  . BIV ICD INSERTION CRT-D N/A 06/21/2019   Procedure: BIV ICD INSERTION CRT-D;  Surgeon: Evans Lance, MD;  Location: [place] [facility];  Service: Cardiovascular;  Laterality: N/A;  . BLADDER NECK SUSPENSION  1970's  . BUNIONECTOMY WITH HAMMERTOE RECONSTRUCTION Bilateral   . CARPAL TUNNEL RELEASE Right 05/2017  . COLONOSCOPY W/ POLYPECTOMY    . CORONARY ANGIOPLASTY WITH STENT PLACEMENT  12/29/2017  . CORONARY BALLOON ANGIOPLASTY N/A 02/08/2019   Procedure: CORONARY BALLOON ANGIOPLASTY;  Surgeon: Adrian Prows, MD;  Location: [place] [facility];  Service: Cardiovascular;   Laterality: N/A;  . CORONARY STENT INTERVENTION N/A 12/29/2017   Procedure: CORONARY STENT INTERVENTION;  Surgeon: Nigel Mormon, MD;  Location: [place] [facility];  Service: Cardiovascular;  Laterality: N/A;  . CORONARY STENT INTERVENTION N/A 04/26/2018   Procedure: CORONARY STENT INTERVENTION;  Surgeon: Nigel Mormon, MD;  Location: [place] [facility];  Service: Cardiovascular;  Laterality: N/A;  . CORONARY STENT INTERVENTION N/A 02/08/2019   Procedure: CORONARY STENT INTERVENTION;  Surgeon: Adrian Prows, MD;  Location: [place] [facility];  Service: Cardiovascular;  Laterality: N/A;  . ESOPHAGOGASTRODUODENOSCOPY ENDOSCOPY    . FORAMINAL DECOMPRESSION AT L2 TO THE SACRUM  01-05-2008   L2  -  S1  . GANGLION CYST EXCISION Left 01/17/2009   ring finger  . IMPLANTATION PERMANENT SPINAL CORD STIMULATOR  06-15-2008   JUNE 2013--  BATTERY CHANGE  . JOINT REPLACEMENT    . LEFT HEART CATH AND CORONARY ANGIOGRAPHY N/A 04/26/2018   Procedure: LEFT HEART CATH AND CORONARY ANGIOGRAPHY;  Surgeon: Nigel Mormon, MD;  Location: [place] [facility];  Service: Cardiovascular;  Laterality: N/A;  . LEFT HEART CATH AND CORONARY ANGIOGRAPHY N/A 02/08/2019   Procedure: LEFT HEART CATH AND CORONARY ANGIOGRAPHY;  Surgeon: Adrian Prows, MD;  Location: [place] [facility];  Service: Cardiovascular;  Laterality: N/A;  . LIPOMA EXCISION Right    RIGHT ELBOW  . METATARSAL HEAD EXCISION Right 06/16/2018   Procedure: right 5th metatarsal excision, a mini c-arm;  Surgeon: Trula Slade, DPM;  Location: [facility];  Service: Podiatry;  Laterality: Right;  anesthesia can do block  . RIGHT/LEFT HEART CATH AND CORONARY ANGIOGRAPHY N/A 12/29/2017   Procedure: RIGHT/LEFT HEART CATH AND CORONARY ANGIOGRAPHY;  Surgeon: Nigel Mormon, MD;  Location: [place] [facility];  Service: Cardiovascular;  Laterality: N/A;  . SHOULDER OPEN ROTATOR CUFF REPAIR  09/07/2012   Procedure: ROTATOR CUFF REPAIR SHOULDER OPEN;   Surgeon: Magnus Sinning, MD;  Location: [place];  Service: Orthopedics;  Laterality: Left;  OPEN ANTERIOR ACROMIONECTOMY AND ROTATOR CUFF REPAIR ON LEFT   . SHOULDER OPEN ROTATOR CUFF REPAIR Left 12/28/2012   Procedure: ROTATOR CUFF REPAIR SHOULDER OPEN;  Surgeon: Magnus Sinning, MD;  Location: [place];  Service: Orthopedics;  Laterality: Left;  OPEN SHOULDER ROTATOR CUFF REPAIR ON LEFT  WITH ANTERIOR ACROMINECTOMY   . SHOULDER OPEN ROTATOR CUFF REPAIR Right 03/28/2003   RIGHT SHOULDER  DEGENERATIVE AC JOINT AND RC TEAR  . SPINAL CORD STIMULATOR BATTERY EXCHANGE N/A 07/03/2016   Procedure: SPINAL CORD STIMULATOR BATTERY PLACMENT;  Surgeon: Melina Schools, MD;  Location: [place] [facility];  Service: Orthopedics;  Laterality: N/A;  . TONSILLECTOMY  AGE 60  . 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 VAGINAL HYSTERECTOMY  1970's   Family History  Problem Relation Age of Onset  . Cancer Mother        breast  . Heart Problems Mother   . Prostate cancer Father   . Muscular dystrophy Son   . Lung cancer Son   . Cancer Maternal Grandmother  breast  . Heart disease Maternal Grandfather   . Breast cancer Sister    Social History   Tobacco Use  . Smoking status: Never Smoker  . Smokeless tobacco: Never Used  Substance Use Topics  . Alcohol use: No    Marital Sttus: Married  ROS  Review of Systems  Constitutional: Positive for malaise/fatigue and weight gain.  HENT: Positive for congestion.   Cardiovascular: Positive for chest pain (yesterday, none since presentation to ED), dyspnea on exertion, leg swelling and orthopnea. Negative for palpitations and paroxysmal nocturnal dyspnea.  Respiratory: Positive for cough and shortness of breath. Negative for hemoptysis.   All other systems reviewed and are negative.  Objective   Vitals with BMI 02/12/2021 02/12/2021 02/12/2021  Height - - -  Weight - - -  BMI - - -  Systolic 062 694 854   Diastolic 51 76 44  Pulse 55 59 53    Blood pressure (!) 138/51, pulse (!) 55, temperature 97.9 F (36.6 C), temperature source Oral, resp. rate 19, SpO2 94 %.    Physical Exam Constitutional:      General: She is not in acute distress.    Appearance: She is obese.  HENT:     Head: Normocephalic and atraumatic.     Nose: Nose normal.     Mouth/Throat:     Mouth: Mucous membranes are moist.  Eyes:     Extraocular Movements: Extraocular movements intact.     Conjunctiva/sclera: Conjunctivae normal.  Neck:     Vascular: JVD present.  Cardiovascular:     Rate and Rhythm: Regular rhythm. Bradycardia present.     Pulses:          Carotid pulses are 2+ on the right side and 2+ on the left side.      Radial pulses are 2+ on the right side and 2+ on the left side.       Popliteal pulses are 0 on the right side and 0 on the left side.       Dorsalis pedis pulses are 0 on the right side and 0 on the left side.       Posterior tibial pulses are 0 on the right side and 0 on the left side.     Heart sounds: S1 normal and S2 normal. No murmur heard. No gallop.   Pulmonary:     Effort: Pulmonary effort is normal.     Breath sounds: Rales (bilateral lower lung fields ) present. No wheezing.  Abdominal:     General: Bowel sounds are normal. There is no distension.     Palpations: Abdomen is soft.     Tenderness: There is no abdominal tenderness.  Musculoskeletal:     Cervical back: Normal range of motion and neck supple.     Right lower leg: Edema (1+ pitting to mid shin) present.     Left lower leg: Edema (1+ pitting to mid shin) present.     Comments: Left arm and hand swelling compared to right  Skin:    General: Skin is warm and dry.     Capillary Refill: Capillary refill takes less than 2 seconds.     Comments: Mild erythema anterior shins bilaterally  Neurological:     General: No focal deficit present.     Mental Status: She is alert and oriented to person, place, and time.   Psychiatric:        Mood and Affect: Mood normal.  Behavior: Behavior normal.    Laboratory examination:    Recent Labs    08/14/20 1619 08/29/20 1559 11/14/20 1334 02/11/21 1755 02/12/21 0319  NA 143 144 145* 143 141  K 3.7 3.9 3.6 4.1 3.8  CL 109* 106 105 104 103  CO2 21 26 27  33* 31  GLUCOSE 230* 211* 206* 184* 149*  BUN 20 24 16 14 12   CREATININE 0.99 1.04* 0.93 0.91 0.86  CALCIUM 9.1 9.3 9.2 9.6 9.0  GFRNONAA 54* 51* 59* >60 >60  GFRAA 63 59* 68  --   --    estimated creatinine clearance is 54.2 mL/min (by C-G formula based on SCr of 0.86 mg/dL).  CMP Latest Ref Rng & Units 02/12/2021 02/11/2021 11/14/2020  Glucose 70 - 99 mg/dL 149(H) 184(H) 206(H)  BUN 8 - 23 mg/dL 12 14 16   Creatinine 0.44 - 1.00 mg/dL 0.86 0.91 0.93  Sodium 135 - 145 mmol/L 141 143 145(H)  Potassium 3.5 - 5.1 mmol/L 3.8 4.1 3.6  Chloride 98 - 111 mmol/L 103 104 105  CO2 22 - 32 mmol/L 31 33(H) 27  Calcium 8.9 - 10.3 mg/dL 9.0 9.6 9.2  Total Protein 6.0 - 8.5 g/dL - - -  Total Bilirubin 0.0 - 1.2 mg/dL - - -  Alkaline Phos 39 - 117 IU/L - - -  AST 0 - 40 IU/L - - -  ALT 0 - 32 IU/L - - -   CBC Latest Ref Rng & Units 02/12/2021 02/11/2021 08/14/2020  WBC 4.0 - 10.5 K/uL 4.1 4.4 -  Hemoglobin 12.0 - 15.0 g/dL 11.9(L) 13.1 11.9  Hematocrit 36.0 - 46.0 % 36.5 41.0 34.2  Platelets 150 - 400 K/uL 92(L) 94(L) -   Lipid Panel No results for input(s): CHOL, TRIG, LDLCALC, VLDL, HDL, CHOLHDL, LDLDIRECT in the last 8760 hours.  HEMOGLOBIN A1C Lab Results  Component Value Date   HGBA1C 9.8 (H) 02/12/2021   MPG 234.56 02/12/2021   TSH No results for input(s): TSH in the last 8760 hours. BNP (last 3 results) Recent Labs    02/11/21 2256  BNP 4,088.9*   Results for orders placed or performed during the hospital encounter of 02/11/21 (from the past 48 hour(s))  Basic metabolic panel     Status: Abnormal   Collection Time: 02/11/21  5:55 PM  Result Value Ref Range   Sodium 143 135 - 145  mmol/L   Potassium 4.1 3.5 - 5.1 mmol/L   Chloride 104 98 - 111 mmol/L   CO2 33 (H) 22 - 32 mmol/L   Glucose, Bld 184 (H) 70 - 99 mg/dL    Comment: Glucose reference range applies only to samples taken after fasting for at least 8 hours.   BUN 14 8 - 23 mg/dL   Creatinine, Ser 0.91 0.44 - 1.00 mg/dL   Calcium 9.6 8.9 - 10.3 mg/dL   GFR, Estimated >60 >60 mL/min    Comment: (NOTE) Calculated using the CKD-EPI Creatinine Equation (2021)    Anion gap 6 5 - 15    Comment: Performed at [facility] [address], [place], [place] [address]  CBC     Status: Abnormal   Collection Time: 02/11/21  5:55 PM  Result Value Ref Range   WBC 4.4 4.0 - 10.5 K/uL   RBC 4.15 3.87 - 5.11 MIL/uL   Hemoglobin 13.1 12.0 - 15.0 g/dL   HCT 41.0 36.0 - 46.0 %   MCV 98.8 80.0 - 100.0 fL   MCH  31.6 26.0 - 34.0 pg   MCHC 32.0 30.0 - 36.0 g/dL   RDW 14.6 11.5 - 15.5 %   Platelets 94 (L) 150 - 400 K/uL    Comment: Immature Platelet Fraction may be clinically indicated, consider ordering this additional test [id number] REPEATED TO VERIFY PLATELET COUNT CONFIRMED BY SMEAR    nRBC 0.0 0.0 - 0.2 %    Comment: Performed at [facility], [place] [address] [place], [place] [address]  Troponin I (High Sensitivity)     Status: None   Collection Time: 02/11/21  5:55 PM  Result Value Ref Range   Troponin I (High Sensitivity) 8 <18 ng/L    Comment: (NOTE) Elevated high sensitivity troponin I (hsTnI) values and significant  changes across serial measurements may suggest ACS but many other  chronic and acute conditions are known to elevate hsTnI results.  Refer to the "Links" section for chest pain algorithms and additional  guidance. Performed at [facility], [place] [address] [place], [place] [address]   Troponin I (High Sensitivity)     Status: None   Collection Time: 02/11/21  8:53 PM  Result Value Ref Range   Troponin I (High Sensitivity) 9 <18 ng/L    Comment: (NOTE) Elevated high  sensitivity troponin I (hsTnI) values and significant  changes across serial measurements may suggest ACS but many other  chronic and acute conditions are known to elevate hsTnI results.  Refer to the "Links" section for chest pain algorithms and additional  guidance. Performed at [facility], [place] [address], [place], [place] [address]   Brain natriuretic peptide     Status: Abnormal   Collection Time: 02/11/21 10:56 PM  Result Value Ref Range   B Natriuretic Peptide 4,088.9 (H) 0.0 - 100.0 pg/mL    Comment: Performed at [facility] [address] [place], [place] [address]  Resp Panel by RT-PCR (Flu A&B, Covid) Nasopharyngeal Swab     Status: None   Collection Time: 02/11/21 11:16 PM   Specimen: Nasopharyngeal Swab; Nasopharyngeal(NP) swabs in vial transport medium  Result Value Ref Range   SARS Coronavirus 2 by RT PCR NEGATIVE NEGATIVE    Comment: (NOTE) SARS-CoV-2 target nucleic acids are NOT DETECTED.  The SARS-CoV-2 RNA is generally detectable in upper respiratory specimens during the acute phase of infection. The lowest concentration of SARS-CoV-2 viral copies this assay can detect is 138 copies/mL. A negative result does not preclude SARS-Cov-2 infection and should not be used as the sole basis for treatment or other patient management decisions. A negative result may occur with  improper specimen collection/handling, submission of specimen other than nasopharyngeal swab, presence of viral mutation(s) within the areas targeted by this assay, and inadequate number of viral copies(<138 copies/mL). A negative result must be combined with clinical observations, patient history, and epidemiological information. The expected result is Negative.  Fact Sheet for Patients:  EntrepreneurPulse.com.au  Fact Sheet for Healthcare Providers:  IncredibleEmployment.be  This test is no t yet approved or cleared by the [place] FDA  and  has been authorized for detection and/or diagnosis of SARS-CoV-2 by FDA under an Emergency Use Authorization (EUA). This EUA will remain  in effect (meaning this test can be used) for the duration of the COVID-19 declaration under Section 564(b)(1) of the Act, 21 U.S.C.section 360bbb-3(b)(1), unless the authorization is terminated  or revoked sooner.       Influenza A by PCR NEGATIVE NEGATIVE   Influenza B  by PCR NEGATIVE NEGATIVE    Comment: (NOTE) The Xpert Xpress SARS-CoV-2/FLU/RSV plus assay is intended as an aid in the diagnosis of influenza from Nasopharyngeal swab specimens and should not be used as a sole basis for treatment. Nasal washings and aspirates are unacceptable for Xpert Xpress SARS-CoV-2/FLU/RSV testing.  Fact Sheet for Patients: EntrepreneurPulse.com.au  Fact Sheet for Healthcare Providers: IncredibleEmployment.be  This test is not yet approved or cleared by the [place] FDA and has been authorized for detection and/or diagnosis of SARS-CoV-2 by FDA under an Emergency Use Authorization (EUA). This EUA will remain in effect (meaning this test can be used) for the duration of the COVID-19 declaration under Section 564(b)(1) of the Act, 21 U.S.C. section 360bbb-3(b)(1), unless the authorization is terminated or revoked.  Performed at [facility], [place] [address] [place], [place] [address]   Basic metabolic panel     Status: Abnormal   Collection Time: 02/12/21  3:19 AM  Result Value Ref Range   Sodium 141 135 - 145 mmol/L   Potassium 3.8 3.5 - 5.1 mmol/L   Chloride 103 98 - 111 mmol/L   CO2 31 22 - 32 mmol/L   Glucose, Bld 149 (H) 70 - 99 mg/dL    Comment: Glucose reference range applies only to samples taken after fasting for at least 8 hours.   BUN 12 8 - 23 mg/dL   Creatinine, Ser 0.86 0.44 - 1.00 mg/dL   Calcium 9.0 8.9 - 10.3 mg/dL   GFR, Estimated >60 >60 mL/min    Comment: (NOTE) Calculated  using the CKD-EPI Creatinine Equation (2021)    Anion gap 7 5 - 15    Comment: Performed at [facility] [address] [place], [place] [address]  CBC     Status: Abnormal   Collection Time: 02/12/21  3:19 AM  Result Value Ref Range   WBC 4.1 4.0 - 10.5 K/uL   RBC 3.72 (L) 3.87 - 5.11 MIL/uL   Hemoglobin 11.9 (L) 12.0 - 15.0 g/dL   HCT 36.5 36.0 - 46.0 %   MCV 98.1 80.0 - 100.0 fL   MCH 32.0 26.0 - 34.0 pg   MCHC 32.6 30.0 - 36.0 g/dL   RDW 14.6 11.5 - 15.5 %   Platelets 92 (L) 150 - 400 K/uL    Comment: Immature Platelet Fraction may be clinically indicated, consider ordering this additional test [id number] CONSISTENT WITH PREVIOUS RESULT    nRBC 0.0 0.0 - 0.2 %    Comment: Performed at [facility], [place] [address] [place], [place] [address]  Hemoglobin A1c     Status: Abnormal   Collection Time: 02/12/21  3:19 AM  Result Value Ref Range   Hgb A1c MFr Bld 9.8 (H) 4.8 - 5.6 %    Comment: (NOTE) Pre diabetes:          5.7%-6.4%  Diabetes:              >6.4%  Glycemic control for   <7.0% adults with diabetes    Mean Plasma Glucose 234.56 mg/dL    Comment: Performed at [facility] [address] [place], [place] [address]  CBG monitoring, ED     Status: Abnormal   Collection Time: 02/12/21  7:03 AM  Result Value Ref Range   Glucose-Capillary 111 (H) 70 - 99 mg/dL    Comment: Glucose reference range applies only to samples taken after fasting for at least 8 hours.  Medications and allergies   Allergies  Allergen Reactions  . Rosiglitazone Maleate Anaphylaxis and Swelling  . Morphine Other (See Comments)    SEVERE HYPOTENSION   . Cephalexin Diarrhea  . Sulfa Antibiotics Diarrhea and Nausea And Vomiting  . Tramadol Other (See Comments)    Unknown reaction  . Elavil [Amitriptyline] Nausea Only  . Tramadol-Acetaminophen Rash     No outpatient medications have been marked as taking for the 02/11/21 encounter [facility] Encounter).     Scheduled Meds: . aspirin EC  81 mg Oral Daily  . busPIRone  5 mg Oral BID  . clopidogrel  75 mg Oral Daily  . FLUoxetine  10 mg Oral Daily  . furosemide  40 mg Intravenous Daily  . gabapentin  100 mg Oral QHS  . hydrALAZINE  50 mg Oral Q8H  . insulin aspart  0-5 Units Subcutaneous QHS  . insulin aspart  0-9 Units Subcutaneous TID WC  . isosorbide dinitrate  30 mg Oral TID  . levothyroxine  50 mcg Oral QAC breakfast  . magnesium oxide  400 mg Oral TID  . methocarbamol  500 mg Oral TID AC & HS  . metoprolol succinate  50 mg Oral Daily  . sacubitril-valsartan  1 tablet Oral BID  . spironolactone  25 mg Oral Daily   Continuous Infusions: PRN Meds:.ALPRAZolam   I/O last 3 completed shifts: In: -  Out: 1900 [Urine:1900] No intake/output data recorded.    Radiology:   DG Chest 2 View: 02/11/2021 Multilead left-sided pacemaker in place. Cardiomegaly which is likely increased. Peribronchial and interstitial thickening typical pulmonary edema. There is retrocardiac opacity prior suspect small right pleural effusion. Spinal stimulator in place. No visualized pneumothorax.  IMPRESSION: 1. Cardiomegaly with pulmonary edema and small right pleural effusion, findings suspicious for CHF. 2. Retrocardiac opacity with a degree of underlying pleural fluid. There is associated basilar airspace disease which may be atelectasis or pneumonia.  Cardiac Studies:   Carotid artery duplex 24-Dec-2018: No hemodynamically significant arterial disease in the internal carotid artery bilaterally. Minimal plaque noted bilateral carotid arteries. Antegrade right vertebral artery flow. Antegrade left vertebral artery flow.  Coronary angiogram 02/08/2019: Distal left main 20% diffuse disease. Ostial LAD stent shows diffuse 20% in-stent restenosis (3.0 x 15 mm resolute Onyx on 12/29/2017). Mid LAD and mid to distal LAD there are tandem lesions 80% and 95%. Ramus intermediate ostial 95 to 99% stenosis.  Previously placed RI stent widely patent ( 2.0 x 12 mm resolute on 04/26/2018). 30 to 40% tandem lesions in the RCA. Cutting Balloon angioplasty of the ramus intermediate ostial high-grade stenosis, 99% reduced to 0% and Cutting Balloon angioplasty of the mid LAD followed by stenting with 2.5 x 32 mm Synergy DES for high-grade 90% stenosis reduced to 0% and TIMI-3 to TIMI-3 flow maintained in both lesions.  Echocardiogram 11/30/2019: LVEF 20%, left ventricular cavity dilated, moderate concentric LVH, severe hypokinetic global wall motion, grade 2 diastolic impairment, elevated left atrial pressure, severely dilated left atrium, mild to moderate AR, moderate to severe MR, moderate TR, moderate pulmonary hypertension RVSP 64 mmHg, elevated right atrial pressure, small pericardial effusion.  EKG:  02/11/2021: Ventricularly paced rhythm at a rate of 66 bpm.  02/09/19: Sinus bradycardia at rate of 55 bpm, left bundle branch block. No further analysis. No significant change from prior EKG 02/08/2019.    Assessment   1.  Acute on chronic combined systolic and diastolic heart failure 2.  Ischemic cardiomyopathy status post BiV ICD implantation 3.  Coronary artery disease status post LAD stenting 4.  Type 2 diabetes mellitus  Recommendations:   1.  Acute on chronic combined systolic and diastolic heart failure Patient is responding well to diuresis with furosemide 40 mg IV twice daily, will continue this as her urine output has been excellent.  Also continue guideline directed medical therapy including spironolactone 25 mg daily, Entresto 97/23 mg twice daily, metoprolol succinate 50 mg daily as well as isosorbide dinitrate 30 mg and hydralazine 50 mg 3 times daily.  We will continue to monitor renal function electrolytes closely.  Continue strict INO's and daily weights, as well as sodium restriction diet.  We will also continue to trend BNP.  Her serial troponins in the emergency department was  negative, no need to further trend at this point.  Repeat echocardiogram is pending.   Counseled patient regarding the importance of medication and diet compliance.  Suspect present acute exacerbation of heart failure to be driven by dietary non-compliance.  2.  Ischemic cardiomyopathy status post BiV ICD implantation Management per above.  3.  Coronary artery disease status post LAD stenting Continue aspirin 81 mg daily and Plavix 75 mg daily.  Serial troponins in the emergency department were negative.   4.  Type 2 diabetes mellitus Management per primary team.  However discussed with patient regarding importance of diabetes control and reducing ongoing cardiovascular risk.  Management of other chronic comorbid conditions per primary team.  Patient was seen in collaboration with [name]. He also reviewed patient's chart and examined the patient. [name] is in agreement of the plan.     Alethia Berthold, PA-C 02/12/2021, 10:53 AM Office: [phone]

## 2021-02-12 NOTE — ED Notes (Signed)
MD notified of pts urine out and trending Bps. Decision made to hold Apresoline for now,

## 2021-02-12 NOTE — H&P (Signed)
History and Physical    Sara Graves YHC:[id number] DOB: Jul 03, 1941 DOA: 02/11/2021  PCP: [name], PA-C  Patient coming from: Home  I have personally briefly reviewed patient's old medical records in [facility]  Chief Complaint: edema and increasing shortness of breath   HPI: Sara Graves is a 80 y.o. female with medical history significant for uncontrolled diabetes mellitus, ischemic cardiomyopathy with severe LV systolic dysfunction, underlying left bundle branch block and SP Medtronic BiV ICD implantation on 06/21/2019, CAD s/p PCI and LAD stent, hypothyrodism who presents with worsening shortness of breath and generalized edema.   She normally wears 2L of O2 at night and when she goes to the store but lately has been wearing it throughout the day.  Has dyspnea with exertion and orthopnea.  Also notes chest tightness associated with her shortness of breath.  She has noticed increasing edema of her left arm and face. Also had 4lbs weight gain. She also has been having coughing, runny nose for the past few days after her husband developed similar symptoms. No fever.   She follows with [name] cardiology outpatient and was seen on 3/17 for initially an ICD check and noted on OptiVol fluid monitoring to have been accumulating fluid since December 2021.She was reinitiated on low dose beta blocker and  Transitioned from isosorbide mononitrate to isosorbide dinitrate PT elevated blood pressure.  Patient has also reportedly been eating more fast food since she has been depressed due to the loss of 2 of her sons recently.  ED Course: She was afebrile and bradycardic down to 50s and initially mildly hypertensive up to systolic 517O.  She was on 2 L via nasal cannula which is her baseline. BNP notably elevated at over 4000.  Troponin reassuring at 899.  Chest x-ray shows cardiomegaly with pulmonary edema. CBC showed no leukocytosis but has slight worsening of her chronic  thrombocytopenia.  Mildly elevated glucose at 189.  Review of Systems: Constitutional: + Weight Change, No Fever ENT/Mouth: No sore throat, + Rhinorrhea Eyes: No Eye Pain, No Vision Changes Cardiovascular: + Chest Pain, +SOB, No PND, + Dyspnea on Exertion, + Orthopnea,+ Edema, No Palpitations Respiratory: + Cough, No Sputum  Gastrointestinal: No Nausea, No Vomiting, No Diarrhea, No Constipation, No Pain Genitourinary:No Urgency, No Flank Pain Musculoskeletal: No Arthralgias, No Myalgias Skin: No Skin Lesions, No Pruritus, Neuro: no Weakness, No Numbness, Psych: No Anxiety/Panic, + Depression, no decrease appetite Heme/Lymph: No Bruising, No Bleeding   Past Medical History:  Diagnosis Date  . Acute combined systolic and diastolic heart failure ([place]) 04/25/2018  . Anxiety   . Arthritis    "hands" (12/29/2017)  . Basal cell carcinoma of nose    removed  . Bursitis of left shoulder   . BV ICD Medtronic [place] MRI CRTD 06/21/2019  . Cat scratch fever    Late 90s  . Chronic combined systolic and diastolic CHF (congestive heart failure) ([place]) 06/08/2019  . Coronary artery disease   . Depression   . Diabetes mellitus, type II, insulin dependent ([place])   . Encounter for assessment of implantable cardioverter-defibrillator (ICD) 09/28/2019  . GERD (gastroesophageal reflux disease)   . H. pylori infection 2008 and 1998   treated  . Hyperlipidemia   . Hypertension   . Hypothyroidism   . Low oxygen saturation   . Multinodular goiter   . Rotator cuff tear, left recurrent   . Urge urinary incontinence   . UTI (lower urinary tract infection) 05/2016  Past Surgical History:  Procedure Laterality Date  . APPENDECTOMY  AGE 12  . BACK SURGERY    . BASAL CELL CARCINOMA EXCISION     "nose"  . BIV ICD INSERTION CRT-D N/A 06/21/2019   Procedure: BIV ICD INSERTION CRT-D;  Surgeon: Evans Lance, MD;  Location: [facility];  Service: Cardiovascular;  Laterality: N/A;  . BLADDER NECK  SUSPENSION  1970's  . BUNIONECTOMY WITH HAMMERTOE RECONSTRUCTION Bilateral   . CARPAL TUNNEL RELEASE Right 05/2017  . COLONOSCOPY W/ POLYPECTOMY    . CORONARY ANGIOPLASTY WITH STENT PLACEMENT  12/29/2017  . CORONARY BALLOON ANGIOPLASTY N/A 02/08/2019   Procedure: CORONARY BALLOON ANGIOPLASTY;  Surgeon: Adrian Prows, MD;  Location: [facility] [facility];  Service: Cardiovascular;  Laterality: N/A;  . CORONARY STENT INTERVENTION N/A 12/29/2017   Procedure: CORONARY STENT INTERVENTION;  Surgeon: Nigel Mormon, MD;  Location: [place] [facility];  Service: Cardiovascular;  Laterality: N/A;  . CORONARY STENT INTERVENTION N/A 04/26/2018   Procedure: CORONARY STENT INTERVENTION;  Surgeon: Nigel Mormon, MD;  Location: [facility];  Service: Cardiovascular;  Laterality: N/A;  . CORONARY STENT INTERVENTION N/A 02/08/2019   Procedure: CORONARY STENT INTERVENTION;  Surgeon: Adrian Prows, MD;  Location: [place];  Service: Cardiovascular;  Laterality: N/A;  . ESOPHAGOGASTRODUODENOSCOPY ENDOSCOPY    . FORAMINAL DECOMPRESSION AT L2 TO THE SACRUM  01-05-2008   L2  -  S1  . GANGLION CYST EXCISION Left 01/17/2009   ring finger  . IMPLANTATION PERMANENT SPINAL CORD STIMULATOR  06-15-2008   JUNE 2013--  BATTERY CHANGE  . JOINT REPLACEMENT    . LEFT HEART CATH AND CORONARY ANGIOGRAPHY N/A 04/26/2018   Procedure: LEFT HEART CATH AND CORONARY ANGIOGRAPHY;  Surgeon: Nigel Mormon, MD;  Location: [facility];  Service: Cardiovascular;  Laterality: N/A;  . LEFT HEART CATH AND CORONARY ANGIOGRAPHY N/A 02/08/2019   Procedure: LEFT HEART CATH AND CORONARY ANGIOGRAPHY;  Surgeon: Adrian Prows, MD;  Location: [place] [facility];  Service: Cardiovascular;  Laterality: N/A;  . LIPOMA EXCISION Right    RIGHT ELBOW  . METATARSAL HEAD EXCISION Right 06/16/2018   Procedure: right 5th metatarsal excision, a mini c-arm;  Surgeon: Trula Slade, DPM;  Location: [facility];  Service: Podiatry;   Laterality: Right;  anesthesia can do block  . RIGHT/LEFT HEART CATH AND CORONARY ANGIOGRAPHY N/A 12/29/2017   Procedure: RIGHT/LEFT HEART CATH AND CORONARY ANGIOGRAPHY;  Surgeon: Nigel Mormon, MD;  Location: [place];  Service: Cardiovascular;  Laterality: N/A;  . SHOULDER OPEN ROTATOR CUFF REPAIR  09/07/2012   Procedure: ROTATOR CUFF REPAIR SHOULDER OPEN;  Surgeon: Magnus Sinning, MD;  Location: [facility];  Service: Orthopedics;  Laterality: Left;  OPEN ANTERIOR ACROMIONECTOMY AND ROTATOR CUFF REPAIR ON LEFT   . SHOULDER OPEN ROTATOR CUFF REPAIR Left 12/28/2012   Procedure: ROTATOR CUFF REPAIR SHOULDER OPEN;  Surgeon: Magnus Sinning, MD;  Location: [facility];  Service: Orthopedics;  Laterality: Left;  OPEN SHOULDER ROTATOR CUFF REPAIR ON LEFT  WITH ANTERIOR ACROMINECTOMY   . SHOULDER OPEN ROTATOR CUFF REPAIR Right 03/28/2003   RIGHT SHOULDER  DEGENERATIVE AC JOINT AND RC TEAR  . SPINAL CORD STIMULATOR BATTERY EXCHANGE N/A 07/03/2016   Procedure: SPINAL CORD STIMULATOR BATTERY PLACMENT;  Surgeon: Melina Schools, MD;  Location: [facility];  Service: Orthopedics;  Laterality: N/A;  . TONSILLECTOMY  AGE 68  . TOTAL KNEE ARTHROPLASTY Right 05/06/2000  OA RIGHT KNEE  . VAGINAL HYSTERECTOMY  1970's     reports that she has never smoked. She has never used smokeless tobacco. She reports that she does not drink alcohol and does not use drugs. Social History  Allergies  Allergen Reactions  . Rosiglitazone Maleate Anaphylaxis and Swelling  . Morphine Other (See Comments)    SEVERE HYPOTENSION   . Cephalexin Diarrhea  . Sulfa Antibiotics Diarrhea and Nausea And Vomiting  . Tramadol Other (See Comments)    Unknown reaction  . Elavil [Amitriptyline] Nausea Only  . Tramadol-Acetaminophen Rash    Family History  Problem Relation Age of Onset  . Cancer Mother        breast  . Heart Problems Mother   . Prostate cancer Father   . Muscular  dystrophy Son   . Lung cancer Son   . Cancer Maternal Grandmother        breast  . Heart disease Maternal Grandfather   . Breast cancer Sister      Prior to Admission medications   Medication Sig Start Date End Date Taking? Authorizing Provider  alendronate (FOSAMAX) 70 MG tablet Take 1 tablet by mouth daily. 06/19/20   [provider]  ALPRAZolam Duanne Moron) 0.25 MG tablet Take 1 tablet (0.25 mg total) by mouth as directed. 1/2 tablet r twice daily or one tab at night for anxiety 02/07/20   Adrian Prows, MD  aspirin EC 81 MG tablet Take 81 mg by mouth daily.    [provider]  betamethasone valerate ointment (VALISONE) 0.1 % Apply 1 application topically 2 (two) times daily. 04/12/20   Hall-Potvin, [place], PA-C  busPIRone (BUSPAR) 5 MG tablet Take 5 mg by mouth 2 (two) times daily. As needed    [provider]  Calcium Carb-Cholecalciferol (CALCIUM 600 + D PO) Take 1 tablet by mouth daily.     [provider]  clindamycin (CLEOCIN) 150 MG capsule Take 150 mg by mouth 3 (three) times daily.    [provider]  clopidogrel (PLAVIX) 75 MG tablet Take 1 tablet (75 mg total) by mouth daily. 06/25/19   Shirley Friar, PA-C  diphenhydrAMINE (BENADRYL) 25 mg capsule Take 25 mg by mouth every 6 (six) hours as needed.    [provider]  ENTRESTO 97-103 MG TAKE 1 TABLET BY MOUTH TWICE A DAY 02/11/21   Tolia, Sunit, DO  ezetimibe (ZETIA) 10 MG tablet Take 1 tablet (10 mg total) by mouth daily. 09/22/19 10/02/20  Miquel Dunn, NP  Fexofenadine HCl (ALLERGY 24-HR PO) Take by mouth.    [provider]  FLUoxetine (PROZAC) 10 MG capsule Take 2 capsules (20 mg total) by mouth.    [provider]  gabapentin (NEURONTIN) 100 MG capsule Take 100 mg by mouth at bedtime.    [provider]  glucose blood test strip 1 each 3 (three) times daily.  12/31/17   [provider]  hydrALAZINE (APRESOLINE) 50 MG tablet Take  1 tablet (50 mg total) by mouth 3 (three) times daily. 09/28/19 10/02/20  Miquel Dunn, NP  insulin lispro (HUMALOG) 100 UNIT/ML injection Inject 2-6 Units into the skin See admin instructions. For use with V - GO 20 Insulin Delivery Device: inject 6 units subcutaneously three times daily before meals, inject 2 units at bedtime if eating a bedtime snack    [provider]  isosorbide dinitrate (ISORDIL) 30 MG tablet Take 1 tablet (30 mg total) by mouth 3 (three) times daily.  01/31/21   Adrian Prows, MD  levothyroxine (SYNTHROID, LEVOTHROID) 50 MCG tablet Take 50 mcg by mouth daily before breakfast.  08/24/12   Clovis Cao, MD  loperamide (IMODIUM) 2 MG capsule Take by mouth as needed for diarrhea or loose stools.    [provider]  Magnesium Oxide 500 MG TABS TAKE 1 TABLET (500 MG TOTAL) BY MOUTH IN THE MORNING AND AT BEDTIME. Patient taking differently: Take 500 mg by mouth in the morning, at noon, and at bedtime. 06/04/20   Tolia, Sunit, DO  methocarbamol (ROBAXIN) 500 MG tablet Take 500 mg by mouth 4 (four) times daily.    [provider]  metoprolol succinate (TOPROL-XL) 25 MG 24 hr tablet Take 2 tablets (50 mg total) by mouth daily. Take with or immediately following a meal. 01/31/21 05/01/21  Adrian Prows, MD  nitroGLYCERIN (NITROSTAT) 0.4 MG SL tablet PLACE 1 TABLET UNDER THE TONGUE EVERY 5 MINUTES X 3 DOSES AS NEEDED FOR CHEST PAIN. 05/24/20   Tolia, Sunit, DO  ondansetron (ZOFRAN) 4 MG tablet Take 4 mg by mouth every 8 (eight) hours as needed for nausea or vomiting.    [provider]  [facility] VERIO test strip 1 each 3 (three) times daily.  06/13/18   [provider]  rosuvastatin (CRESTOR) 40 MG tablet Take 1 tablet (40 mg total) by mouth at bedtime. 03/28/20 10/02/20  Tolia, Sunit, DO  spironolactone (ALDACTONE) 25 MG tablet Take 25 mg by mouth daily.    [provider]  torsemide (DEMADEX) 10 MG tablet TAKE 1 TABLET (10 MG TOTAL) BY  MOUTH IN THE MORNING. 10/30/20 11/29/20  Tolia, Sunit, DO  UNABLE TO FIND Oxygen  Pt takes 2 liters a day - on and off during the day as needed    [provider]  vitamin B-12 (CYANOCOBALAMIN) 1000 MCG tablet Take 1,000 mcg by mouth daily.  07/28/18   [provider]  Vitamin D, Ergocalciferol, (DRISDOL) 50000 units CAPS capsule Take 50,000 Units by mouth every Thursday.     [provider]    Physical Exam: Vitals:   02/11/21 2330 02/12/21 0000 02/12/21 0030 02/12/21 0100  BP: (!) 160/73 (!) 154/66 (!) 146/42 (!) 148/40  Pulse: (!) 58 (!) 57 (!) 56 (!) 54  Resp: 18 18 17 17   Temp:      TempSrc:      SpO2: 98% 98% 96% 97%    Constitutional: NAD, calm, comfortable, elderly female laying at 30 degree incline Vitals:   02/11/21 2330 02/12/21 0000 02/12/21 0030 02/12/21 0100  BP: (!) 160/73 (!) 154/66 (!) 146/42 (!) 148/40  Pulse: (!) 58 (!) 57 (!) 56 (!) 54  Resp: 18 18 17 17   Temp:      TempSrc:      SpO2: 98% 98% 96% 97%   Eyes: PERRL, lids and conjunctivae normal ENMT: Mucous membranes are moist. Neck: normal, supple Respiratory: clear to auscultation bilaterally, no wheezing, no crackles. Normal respiratory effort on 2L. No accessory muscle use.  Cardiovascular: Regular rate and rhythm, no murmurs / rubs / gallops. +3 pitting pre-tibial lower extremity edema. Edema of left UE.  Abdomen: no tenderness, no masses palpated.  Bowel sounds positive.  GU: external Purwick catheter in place Musculoskeletal: no clubbing / cyanosis. No joint deformity upper and lower extremities. Good ROM, no contractures. Normal muscle tone.  Skin: Discoloration/Chronic venous stasis changes bilateral lower extremity Neurologic: CN 2-12 grossly intact. Sensation intact,  Strength 5/5 in all 4.  Psychiatric:  Normal judgment and insight. Alert and oriented x 3. Normal mood.     Labs on Admission: I have personally reviewed following labs and imaging studies  CBC: Recent  Labs  Lab 02/11/21 1755  WBC 4.4  HGB 13.1  HCT 41.0  MCV 98.8  PLT 94*   Basic Metabolic Panel: Recent Labs  Lab 02/11/21 1755  NA 143  K 4.1  CL 104  CO2 33*  GLUCOSE 184*  BUN 14  CREATININE 0.91  CALCIUM 9.6   GFR: Estimated Creatinine Clearance: 51.2 mL/min (by C-G formula based on SCr of 0.91 mg/dL). Liver Function Tests: No results for input(s): AST, ALT, ALKPHOS, BILITOT, PROT, ALBUMIN in the last 168 hours. No results for input(s): LIPASE, AMYLASE in the last 168 hours. No results for input(s): AMMONIA in the last 168 hours. Coagulation Profile: No results for input(s): INR, PROTIME in the last 168 hours. Cardiac Enzymes: No results for input(s): CKTOTAL, CKMB, CKMBINDEX, TROPONINI in the last 168 hours. BNP (last 3 results) Recent Labs    08/14/20 1609 08/29/20 1559 11/14/20 1335  PROBNP 7,991* 3,594* 10,511*   HbA1C: No results for input(s): HGBA1C in the last 72 hours. CBG: No results for input(s): GLUCAP in the last 168 hours. Lipid Profile: No results for input(s): CHOL, HDL, LDLCALC, TRIG, CHOLHDL, LDLDIRECT in the last 72 hours. Thyroid Function Tests: No results for input(s): TSH, T4TOTAL, FREET4, T3FREE, THYROIDAB in the last 72 hours. Anemia Panel: No results for input(s): VITAMINB12, FOLATE, FERRITIN, TIBC, IRON, RETICCTPCT in the last 72 hours. Urine analysis:    Component Value Date/Time   COLORURINE YELLOW 06/15/2018 0419   APPEARANCEUR CLEAR 06/15/2018 0419   LABSPEC 1.036 (H) 06/15/2018 0419   PHURINE 5.0 06/15/2018 0419   GLUCOSEU >=500 (A) 06/15/2018 0419   HGBUR NEGATIVE 06/15/2018 0419   HGBUR negative 12/03/2010 1405   BILIRUBINUR NEGATIVE 06/15/2018 0419   BILIRUBINUR NEG 06/18/2012 0937   KETONESUR 5 (A) 06/15/2018 0419   PROTEINUR 30 (A) 06/15/2018 0419   UROBILINOGEN 1.0 05/03/2015 1055   NITRITE NEGATIVE 06/15/2018 0419   LEUKOCYTESUR NEGATIVE 06/15/2018 0419    Radiological Exams on Admission: DG Chest 2  View  Result Date: 02/11/2021 CLINICAL DATA:  Shortness of breath, chest pain and leg swelling. EXAM: CHEST - 2 VIEW COMPARISON:  Radiograph 8 of 20 FINDINGS: Multilead left-sided pacemaker in place. Cardiomegaly which is likely increased. Peribronchial and interstitial thickening typical pulmonary edema. There is retrocardiac opacity prior suspect small right pleural effusion. Spinal stimulator in place. No visualized pneumothorax. IMPRESSION: 1. Cardiomegaly with pulmonary edema and small right pleural effusion, findings suspicious for CHF. 2. Retrocardiac opacity with a degree of underlying pleural fluid. There is associated basilar airspace disease which may be atelectasis or pneumonia. Electronically Signed   By: Keith Rake M.D.   On: 02/11/2021 19:08      Assessment/Plan  Acute on chronic combined systolic diastolic dysfunction continue Spironolactone start daily IV 40mg  Lasix Monitor intake and output Daily weights Last echocardiogram on 01/2019 showed severely reduced systolic function with EF of 25 to 30%. Repeat echo Her primary cardiology [name] is aware of her admission and will see in consultation in the morning  Continue Entresto, beta-blocker   Hx of  CAD s/p PCI and LAD stent  continue to isosorbide dinitrate, aspirin and plavix   Ischemic cardiomyopathy with LBBB s/p BiV ICD implantation no arrhythmia noted recently from outpatient ICD interrogation  Chronic thrombocytopenia  Unclear etiology. Lower than usual but stable  Type 2 diabetes Pt has not been using her insulin for some time since she only eats one meal a day since her sons passing  Start on sensitive SSI   Hypothyroidism Continue levothyroxine  Depression Patient recently lost 2 of her sons Continue BuSpar, fluoxetine  DVT prophylaxis:.SCDs-no Lovenox due to thrombocytopenia Code Status: Full Family Communication: Plan discussed with patient at bedside  disposition Plan: Home with at least  2 midnight stays  Consults called:  Admission status: inpatient  Level of care: Telemetry Cardiac  Status is: Inpatient  Remains inpatient appropriate because:IV treatments appropriate due to intensity of illness or inability to take PO   Dispo: The patient is from: Home              Anticipated d/c is to: Home              Patient currently is not medically stable to d/c.   Difficult to place patient No         Orene Desanctis DO [facility]   If 7PM-7AM, please contact night-coverage [email or web address]   02/12/2021, 1:53 AM

## 2021-02-12 NOTE — ED Notes (Signed)
Report given to [facility], Therapist, sports of [employer]

## 2021-02-12 NOTE — Progress Notes (Signed)
  Echocardiogram 2D Echocardiogram has been performed.  Sara Graves 02/12/2021, 5:05 PM

## 2021-02-12 NOTE — Progress Notes (Signed)
PROGRESS NOTE    Sara Graves  VEL:[id number] DOB: 08-19-1941 DOA: 02/11/2021 PCP: [name], PA-C    Brief Narrative:  Sara Graves was admitted to the hospital with a working diagnosis of acute on chronic systolic heart failure decompensation.  80 year old female past medical history for type 2 diabetes mellitus, ischemic cardiomyopathy, coronary artery disease status post PCI, systolic heart failure, status post BiV-ICD, and hypothyroidism. Patient reported worsening dyspnea on exertion, associated with orthopnea.  Increase use of home oxygen.  Positive for pound weight gain, associated with cough, and runny nose.  Patient admits recently dietary indiscretion. On her initial physical examination blood pressure 160/73, heart rate 58, respirate 18, oxygen saturation 96%, lungs are clear to auscultation bilaterally, heart S1-S2, present, rhythmic, abdomen soft, positive bilateral pitting edema at the lower extremities  Sodium 143, potassium 4.1, chloride 104, bicarb 33, glucose 194, BUN 14, creatinine 0.91, white count 4.4, hemoglobin 13.1, hematocrit 41.0, platelets 94. SARS COVID-19 negative.  Chest radiograph with cardiomegaly, bilateral hilar vascular congestion, interstitial infiltrate at the retrocardiac position.  Small right pleural effusion.  EKG 66 bpm, normal axis, left bundle branch block, sinus rhythm, no significant ST segment or T wave changes.  Assessment & Plan:   Principal Problem:   Acute on chronic combined systolic and diastolic CHF (congestive heart failure) (HCC) Active Problems:   Hypothyroidism   DM (diabetes mellitus), type 2, uncontrolled (HCC)   Depression   Thrombocytopenia (HCC)   Coronary artery disease involving native coronary artery of native heart without angina pectoris   ICD Medtronic [place] MRI CRTD 06/21/2019   Ischemic cardiomyopathy   1. Acute on chronic systolic heart failure decompensation. Patient continue to be hypervolemic,  positive edema lower extremities and JVD.  Documented urine output 1,900 cc since admission, blood pressure systolic 258 to 527 mmHg, with warm lower extremities. HR 50's  Continue aggressive diuresis with IV furosemide 40 mg Iv q12 h ro further target negative fluid balance.  Continue with isosorbide dinitrate, metoprolol, entresto and spironolactone.  Follow up on echocardiogram.   2. CAD No chest pain, continue with dual antiplatelet therapy with aspirin and clopidogrel.   3. T2DM.Continue glucose cover and monitoring with insulin sliding scale, patient is tolerating po well.  Added dapagliflozin,   4. Hypothyroid. Continue with levothyroxine  5. Depression. Continue with buspirone and fluoxetine. PRN alprazolam.   Patient continue to be at high risk for worsening heart failure   Status is: Inpatient  Remains inpatient appropriate because:IV treatments appropriate due to intensity of illness or inability to take PO   Dispo: The patient is from: Home              Anticipated d/c is to: Home              Patient currently is not medically stable to d/c.   Difficult to place patient No  DVT prophylaxis: Enoxaparin   Code Status:   full  Family Communication:  No family at the bedside       Consultants:   Cardiology    Subjective: Patient is feeling better but not yet back to baseline, continue to have edema and dyspnea, no nausea or vomiting.   Objective: Vitals:   02/12/21 1415 02/12/21 1430 02/12/21 1445 02/12/21 1550  BP: (!) 143/47 (!) 132/48 (!) 126/43 (!) 151/55  Pulse: (!) 49 (!) 47 (!) 46 61  Resp: (!) 24 19 19 18   Temp:    97.8 F (36.6 C)  TempSrc:  Oral  SpO2: 95% 93% 91% 97%    Intake/Output Summary (Last 24 hours) at 02/12/2021 1612 Last data filed at 02/12/2021 1105 Gross per 24 hour  Intake --  Output 2600 ml  Net -2600 ml   There were no vitals filed for this visit.  Examination:   General: deconditioned and ill looking appearing   Neurology: Awake and alert, non focal  E ENT: mild pallor, no icterus, oral mucosa moist Cardiovascular: No JVD. S1-S2 present, rhythmic, no gallops, rubs, or murmurs. No lower extremity edema. Pulmonary: positive breath sounds bilaterally, with no wheezing, intermittent  rhonchi and rales. Gastrointestinal. Abdomen soft and non tender Skin. No rashes Musculoskeletal: no joint deformities     Data Reviewed: I have personally reviewed following labs and imaging studies  CBC: Recent Labs  Lab 02/11/21 1755 02/12/21 0319  WBC 4.4 4.1  HGB 13.1 11.9*  HCT 41.0 36.5  MCV 98.8 98.1  PLT 94* 92*   Basic Metabolic Panel: Recent Labs  Lab 02/11/21 1755 02/12/21 0319  NA 143 141  K 4.1 3.8  CL 104 103  CO2 33* 31  GLUCOSE 184* 149*  BUN 14 12  CREATININE 0.91 0.86  CALCIUM 9.6 9.0   GFR: Estimated Creatinine Clearance: 54.2 mL/min (by C-G formula based on SCr of 0.86 mg/dL). Liver Function Tests: No results for input(s): AST, ALT, ALKPHOS, BILITOT, PROT, ALBUMIN in the last 168 hours. No results for input(s): LIPASE, AMYLASE in the last 168 hours. No results for input(s): AMMONIA in the last 168 hours. Coagulation Profile: No results for input(s): INR, PROTIME in the last 168 hours. Cardiac Enzymes: No results for input(s): CKTOTAL, CKMB, CKMBINDEX, TROPONINI in the last 168 hours. BNP (last 3 results) Recent Labs    08/14/20 1609 08/29/20 1559 11/14/20 1335  PROBNP 7,991* 3,594* 10,511*   HbA1C: Recent Labs    02/12/21 0319  HGBA1C 9.8*   CBG: Recent Labs  Lab 02/12/21 0703 02/12/21 1125  GLUCAP 111* 131*   Lipid Profile: No results for input(s): CHOL, HDL, LDLCALC, TRIG, CHOLHDL, LDLDIRECT in the last 72 hours. Thyroid Function Tests: No results for input(s): TSH, T4TOTAL, FREET4, T3FREE, THYROIDAB in the last 72 hours. Anemia Panel: No results for input(s): VITAMINB12, FOLATE, FERRITIN, TIBC, IRON, RETICCTPCT in the last 72 hours.    Radiology  Studies: I have reviewed all of the imaging during this hospital visit personally     Scheduled Meds: . aspirin EC  81 mg Oral Daily  . busPIRone  5 mg Oral BID  . clopidogrel  75 mg Oral Daily  . dapagliflozin propanediol  10 mg Oral Daily  . FLUoxetine  10 mg Oral Daily  . furosemide  40 mg Intravenous BID  . gabapentin  100 mg Oral QHS  . insulin aspart  0-5 Units Subcutaneous QHS  . insulin aspart  0-9 Units Subcutaneous TID WC  . isosorbide dinitrate  30 mg Oral TID  . levothyroxine  50 mcg Oral QAC breakfast  . magnesium oxide  400 mg Oral TID  . methocarbamol  500 mg Oral TID AC & HS  . metoprolol succinate  50 mg Oral Daily  . sacubitril-valsartan  1 tablet Oral BID  . spironolactone  25 mg Oral Daily   Continuous Infusions:   LOS: 0 days        Vada Swift Gerome Apley, MD

## 2021-02-12 NOTE — Progress Notes (Signed)
Inpatient Diabetes Program Recommendations  AACE/ADA: New Consensus Statement on Inpatient Glycemic Control  Target Ranges:  Prepandial:   less than 140 mg/dL      Peak postprandial:   less than 180 mg/dL (1-2 hours)      Critically ill patients:  140 - 180 mg/dL   Results for Sara Graves, Sara Graves (MRN [id number]) as of 02/12/2021 13:45  Ref. Range 02/12/2021 07:03 02/12/2021 11:25  Glucose-Capillary Latest Ref Range: 70 - 99 mg/dL 111 (H) 131 (H)  Results for Sara Graves, Sara Graves (MRN [id number]) as of 02/12/2021 13:45  Ref. Range 01/20/2019 21:48 02/12/2021 03:19  Hemoglobin A1C Latest Ref Range: 4.8 - 5.6 % 7.8 (H) 9.8 (H)   Review of Glycemic Control  Diabetes history: DM2 Outpatient Diabetes medications: V-Go20 insulin pump with Lantus in the pump but not had for several months Current orders for Inpatient glycemic control: Novolog 0-9 units TID with meals, Novolog 0-5 units QHS  Inpatient Diabetes Program Recommendations:    HbgA1C:  A1C 9.8% on 02/12/21 indicating an average glucose of 235 mg/dl over the past 2-3 months.  NOTE: Spoke with patient at bedside about diabetes and home regimen for diabetes control. Patient reports being followed by Dr. Hartford Poli (Endocrinologist) for diabetes management. Patient reports that she was using a VGo insulin pump for DM control but she reports she has been out of the pump supplies for several weeks. Patient states that when she went to her pharmacy to get V-Go refills she was told they do not carry them anymore. Therefore, patient has not taken anything for DM since then. Patient states that she was using Lantus in the V-Go insulin pump and she was taking clicks with meals (each click is 2 units of insulin). Asked patient if she was sure she was using Lantus as normally a short acting insulin is used in a V-Go insulin pump and patient states she is certain she is using Lantus insulin in the V-Go pump. Patient reports that she has recently lost her son in January 2022  to a neuromuscular disease and 10 months prior to that she lost her other son to cancer. Patient was very tearful and notes that she has not been taking care of herself because she had been busy taking care of her son who recently passed.  Patient states that she has Lantus vials at home and that she was using Lantus in her V-Go Patient states that she has not called Dr. Hartford Poli about what she should take for DM management.  Discussed A1C results (9.8% on 02/12/21) and explained that current A1C indicates an average glucose of 235 mg/dl over the past 2-3 months. Discussed glucose and A1C goals. Discussed importance of checking CBGs and maintaining good CBG control to prevent long-term and short-term complications. Patient states she is willing to take SQ insulin injections as prescribed at time of discharge and she will plan to make a follow up appointment with Dr. Hartford Poli.   Patient verbalized understanding of information discussed and reports no further questions at this time related to diabetes. At time of discharge, patient will need Rx for test strips and insulin syringes. Patient also notes that if she is prescribed a short acting insulin, she will need Rx for insulin vials.   Called [facility] pharmacy to inquire about insulin prescribed and was informed that patient was last prescribed Humalog vials and V-Go20 in 2020 and no insulin or V-Go refilled since then. Also called Gerald Stabs at Dr. Shirlyn Goltz office and was informed that patient  is prescribed V-Go20 insulin pump with Humalog and prescribed to use 6 units with meals (3 clicks as each click is 2 units of insulin).   Thanks, Barnie Alderman, RN, MSN, CDE Diabetes Coordinator Inpatient Diabetes Program [phone] (Team Pager)

## 2021-02-13 ENCOUNTER — Encounter (HOSPITAL_COMMUNITY): Payer: Self-pay | Admitting: Family Medicine

## 2021-02-13 ENCOUNTER — Inpatient Hospital Stay (HOSPITAL_COMMUNITY): Payer: Medicare PPO

## 2021-02-13 DIAGNOSIS — I130 Hypertensive heart and chronic kidney disease with heart failure and stage 1 through stage 4 chronic kidney disease, or unspecified chronic kidney disease: Secondary | ICD-10-CM | POA: Insufficient documentation

## 2021-02-13 DIAGNOSIS — Z9981 Dependence on supplemental oxygen: Secondary | ICD-10-CM

## 2021-02-13 DIAGNOSIS — N183 Chronic kidney disease, stage 3 unspecified: Secondary | ICD-10-CM | POA: Insufficient documentation

## 2021-02-13 DIAGNOSIS — M7989 Other specified soft tissue disorders: Secondary | ICD-10-CM

## 2021-02-13 LAB — BASIC METABOLIC PANEL
Anion gap: 6 (ref 5–15)
BUN: 15 mg/dL (ref 8–23)
CO2: 36 mmol/L — ABNORMAL HIGH (ref 22–32)
Calcium: 9.1 mg/dL (ref 8.9–10.3)
Chloride: 98 mmol/L (ref 98–111)
Creatinine, Ser: 1.11 mg/dL — ABNORMAL HIGH (ref 0.44–1.00)
GFR, Estimated: 51 mL/min — ABNORMAL LOW (ref >60)
Glucose, Bld: 123 mg/dL — ABNORMAL HIGH (ref 70–99)
Potassium: 3.9 mmol/L (ref 3.5–5.1)
Sodium: 140 mmol/L (ref 135–145)

## 2021-02-13 LAB — GLUCOSE, CAPILLARY
Glucose-Capillary: 112 mg/dL — ABNORMAL HIGH (ref 70–99)
Glucose-Capillary: 202 mg/dL — ABNORMAL HIGH (ref 70–99)
Glucose-Capillary: 193 mg/dL — ABNORMAL HIGH (ref 70–99)
Glucose-Capillary: 270 mg/dL — ABNORMAL HIGH (ref 70–99)

## 2021-02-13 LAB — MAGNESIUM: Magnesium: 1.7 mg/dL (ref 1.7–2.4)

## 2021-02-13 LAB — BRAIN NATRIURETIC PEPTIDE: B Natriuretic Peptide: 2726.8 pg/mL — ABNORMAL HIGH (ref 0.0–100.0)

## 2021-02-13 MED ORDER — DICLOFENAC SODIUM 1 % EX GEL
2.0000 g | Freq: Four times a day (QID) | CUTANEOUS | Status: DC
  Administered 2021-02-13 – 2021-02-15 (×6): 2 g via TOPICAL
  Filled 2021-02-13 (×2): qty 100

## 2021-02-13 NOTE — Evaluation (Signed)
Occupational Therapy Evaluation Patient Details Name: Sara Graves MRN: [id number] DOB: October 16, 1941 Today's Date: 02/13/2021    History of Present Illness Pt is a 80 y.o. female who presented 3/28 with edema and SOB with acute on chronic systolic heart failue decomensations. PMH: uncontrolled diabetes mellitus, ischemic cardiomyopathy with severe LV systolic dysfunction, underlying left bundle branch block and SP Medtronic BiV ICD implantation on 06/21/2019, CAD s/p PCI and LAD stent, hypothyrodism, urinary incontinence, L rotator cuff tear, HTN.   Clinical Impression   Pt admitted with the above diagnosis and has the deficits listed below. Pt would benefit from cont OT while in acute care to maximize adl independence. Pt lives with husband who can assist her at home as needed. Feel pt is safe to go home and does not need post acute OT. Will see pt acutely to discuss energy conservation and maximize safety prior to returning home.     Follow Up Recommendations  No OT follow up;Supervision - Intermittent    Equipment Recommendations  None recommended by OT    Recommendations for Other Services       Precautions / Restrictions Precautions Precautions: Fall Precaution Comments: monitor SpO2, urinary incontinence Restrictions Weight Bearing Restrictions: No      Mobility Bed Mobility Overal bed mobility: Independent             General bed mobility comments: Pt able to perform all bed mobility with bed flat and no use of rails.    Transfers Overall transfer level: Needs assistance Equipment used: 4-wheeled walker Transfers: Sit to/from Stand Sit to Stand: Min guard         General transfer comment: Pt comes to stand with min guard and extra time. Requires cues to remember to lock rollator breaks prior to coming to stand or returning to sit.    Balance Overall balance assessment: No apparent balance deficits (not formally assessed)                                          ADL either performed or assessed with clinical judgement   ADL Overall ADL's : Needs assistance/impaired Eating/Feeding: Independent;Sitting   Grooming: Wash/dry hands;Oral care;Set up;Standing   Upper Body Bathing: Set up;Sitting   Lower Body Bathing: Min guard;Sit to/from stand   Upper Body Dressing : Set up;Sitting   Lower Body Dressing: Min guard;Sit to/from stand   Toilet Transfer: Minimal assistance;Ambulation;RW;Comfort height toilet   Toileting- Clothing Manipulation and Hygiene: Min guard;Sit to/from stand       Functional mobility during ADLs: Min guard;Rolling walker General ADL Comments: Pt doing well with adls.  Husband at home to assist if needed with min assist she occasionally needs. Pt stated she has had a steady decline over the last two weeks with adls and feels much better this am.     Vision Baseline Vision/History: Wears glasses Wears Glasses: At all times Patient Visual Report: No change from baseline Vision Assessment?: No apparent visual deficits     Perception     Praxis Praxis Praxis tested?: Within functional limits    Pertinent Vitals/Pain Pain Assessment: No/denies pain Pain Score: 7  Pain Location: R chest/ribs Pain Descriptors / Indicators: Sore;Discomfort;Grimacing;Guarding Pain Intervention(s): Limited activity within patient's tolerance;Monitored during session;Repositioned     Hand Dominance Right   Extremity/Trunk Assessment Upper Extremity Assessment Upper Extremity Assessment: LUE deficits/detail LUE Deficits / Details: Pt with rotator  cuff tear.  Shoulder strength 4-/5.  Otherwise 4+/5 throughout. Significant swelling but pt states it is much improved. LUE Sensation: WNL LUE Coordination: WNL   Lower Extremity Assessment Lower Extremity Assessment: Defer to PT evaluation   Cervical / Trunk Assessment Cervical / Trunk Assessment: Kyphotic   Communication Communication Communication: No  difficulties   Cognition Arousal/Alertness: Awake/alert Behavior During Therapy: WFL for tasks assessed/performed Overall Cognitive Status: Within Functional Limits for tasks assessed                                     General Comments  Pt overall appears safe to return home with husband providing min assist with some adls.    Exercises     Shoulder Instructions      Home Living Family/patient expects to be discharged to:: Private residence Living Arrangements: Spouse/significant other Available Help at Discharge: Family;Available 24 hours/day Type of Home: House Home Access: Ramped entrance     Home Layout: One level     Bathroom Shower/Tub: Teacher, early years/pre: Standard     Home Equipment: Shower seat;Bedside commode;Walker - 4 wheels;Walker - 2 wheels;Cane - single point   Additional Comments: Husband with "bad knee", but can do light lifting.      Prior Functioning/Environment Level of Independence: Independent with assistive device(s)        Comments: Uses rollator in home and in community. Pt drives sometimes, but husband can drive. On 2L/min O2 via [place] at home.        OT Problem List: Decreased strength;Impaired balance (sitting and/or standing);Decreased knowledge of use of DME or AE;Increased edema      OT Treatment/Interventions: Self-care/ADL training;Therapeutic activities;Energy conservation    OT Goals(Current goals can be found in the care plan section) Acute Rehab OT Goals Patient Stated Goal: to get healthier and return to her baseline OT Goal Formulation: With patient Time For Goal Achievement: 02/27/21 Potential to Achieve Goals: Good ADL Goals Additional ADL Goal #1: Pt will walk to bathroom with rollator and complete all toileting with mod I. Additional ADL Goal #2: Pt will gather all clothes with rollator and dress self with mod I. Additional ADL Goal #3: Pt will state 2 things she can do at home during adls  to conserve energy with no assist.  OT Frequency: Min 2X/week   Barriers to D/C:            Co-evaluation              AM-PAC OT "6 Clicks" Daily Activity     Outcome Measure Help from another person eating meals?: None Help from another person taking care of personal grooming?: None Help from another person toileting, which includes using toliet, bedpan, or urinal?: A Little Help from another person bathing (including washing, rinsing, drying)?: A Little Help from another person to put on and taking off regular upper body clothing?: None Help from another person to put on and taking off regular lower body clothing?: A Little 6 Click Score: 21   End of Session Equipment Utilized During Treatment: Rolling walker;Oxygen Nurse Communication: Mobility status  Activity Tolerance: Patient tolerated treatment well Patient left: in chair;with call bell/phone within reach  OT Visit Diagnosis: Unsteadiness on feet (R26.81)                Time: 4496-7591 OT Time Calculation (min): 17 min Charges:  OT General Charges $OT  Visit: 1 Visit OT Evaluation $OT Eval Low Complexity: 1 Low  Glenford Peers 02/13/2021, 11:58 AM

## 2021-02-13 NOTE — Progress Notes (Signed)
Progress Note  Patient Name: Sara Graves Date of Encounter: 02/13/2021  Attending physician: Tawni Millers Primary care provider: [name], PA-C Primary Cardiologist: Rex Kras, DO, [facility]  Subjective: Sara Graves is a 80 y.o. female who was seen and examined at bedside at approximately 130pm No events overnight. Denies chest pain. Shortness of breath has improved significant patient diuresing well. Currently on 2 L nasal cannula oxygen, home dose. Lower extremity swelling has improved as well.  Objective: Vital Signs in the last 24 hours: Temp:  [97.6 F (36.4 C)-98.6 F (37 C)] 97.6 F (36.4 C) (03/30 1226) Pulse Rate:  [46-61] 53 (03/30 1226) Resp:  [18-24] 18 (03/30 1226) BP: (92-151)/(40-62) 92/40 (03/30 1226) SpO2:  [91 %-98 %] 93 % (03/30 1226) Weight:  [80.4 kg] 80.4 kg (03/30 0352)  Intake/Output:  Intake/Output Summary (Last 24 hours) at 02/13/2021 1404 Last data filed at 02/13/2021 0659 Gross per 24 hour  Intake 960 ml  Output 3550 ml  Net -2590 ml    Net IO Since Admission: -5,190 mL [02/13/21 1404]  Weights:  Filed Weights   02/13/21 0352  Weight: 80.4 kg    Telemetry: Personally reviewed.  Paced rhythm with occasional PVCs.  Physical examination: PHYSICAL EXAM: Vitals with BMI 02/13/2021 02/13/2021 02/12/2021  Height - - -  Weight - 177 lbs 5 oz -  BMI - 37.85 -  Systolic 92 885 027  Diastolic 40 62 59  Pulse 53 51 58    CONSTITUTIONAL: Age-appropriate female, hemodynamically stable, no acute distress. SKIN: Skin is warm and dry. No rash noted. No cyanosis. No pallor. No jaundice HEAD: Normocephalic and atraumatic.  EYES: No scleral icterus MOUTH/THROAT: Moist oral membranes.  NECK: No JVD present. No thyromegaly noted. No carotid bruits  LYMPHATIC: No visible cervical adenopathy.  CHEST Normal respiratory effort. No intercostal retractions  LUNGS: Clear to auscultation bilaterally.  No stridor. No wheezes. No  rales.  CARDIOVASCULAR: Regular, positive X4-J2, soft holosystolic murmur heard at the apex, no gallops or rubs. ABDOMINAL: Soft, nontender, nondistended, positive bowel sounds in all 4 quadrants, no apparent ascites.  EXTREMITIES: No peripheral edema.  Left upper extremity swollen compared to the right. HEMATOLOGIC: No significant bruising NEUROLOGIC: Oriented to person, place, and time. Nonfocal. Normal muscle tone.  PSYCHIATRIC: Normal mood and affect. Normal behavior. Cooperative  Lab Results: Hematology Recent Labs  Lab 02/11/21 1755 02/12/21 0319  WBC 4.4 4.1  RBC 4.15 3.72*  HGB 13.1 11.9*  HCT 41.0 36.5  MCV 98.8 98.1  MCH 31.6 32.0  MCHC 32.0 32.6  RDW 14.6 14.6  PLT 94* 92*    Chemistry Recent Labs  Lab 02/11/21 1755 02/12/21 0319 02/13/21 0218  NA 143 141 140  K 4.1 3.8 3.9  CL 104 103 98  CO2 33* 31 36*  GLUCOSE 184* 149* 123*  BUN 14 12 15   CREATININE 0.91 0.86 1.11*  CALCIUM 9.6 9.0 9.1  GFRNONAA >60 >60 51*  ANIONGAP 6 7 6      Cardiac Enzymes: Cardiac Panel (last 3 results) Recent Labs    02/11/21 1755 02/11/21 2053  TROPONINIHS 8 9    BNP (last 3 results) Recent Labs    02/11/21 2256 02/13/21 0218  BNP 4,088.9* 2,726.8*    ProBNP (last 3 results) Recent Labs    08/14/20 1609 08/29/20 1559 11/14/20 1335  PROBNP 7,991* 3,594* 10,511*     DDimer No results for input(s): DDIMER in the last 168 hours.   Hemoglobin A1c:  Lab  Results  Component Value Date   HGBA1C 9.8 (H) 02/12/2021   MPG 234.56 02/12/2021    TSH No results for input(s): TSH in the last 8760 hours.  Lipid Panel     Component Value Date/Time   CHOL 171 02/07/2020 1524   TRIG 62 02/07/2020 1524   HDL 55 02/07/2020 1524   CHOLHDL 2.9 12/26/2019 1407   CHOLHDL 3.9 03/25/2015 0325   VLDL 23 03/25/2015 0325   LDLCALC 104 (H) 02/07/2020 1524   LDLDIRECT 90 04/08/2012 1414    Imaging: DG Chest 2 View  Result Date: 02/13/2021 CLINICAL DATA:  Shortness of  breath. EXAM: CHEST - 2 VIEW COMPARISON:  07/06/2021. FINDINGS: AICD in stable position. Stable cardiomegaly. Persistent left base infiltrate/edema and left-sided pleural effusion. No pneumothorax. Postsurgical changes right shoulder. Neurostimulator noted stable position. IMPRESSION: 1.  AICD in stable position.  Stable cardiomegaly. 2. Persistent left base infiltrate/edema and left-sided pleural effusion. No interim change. Electronically Signed   By: Marcello Moores  Register   On: 02/13/2021 06:58   DG Chest 2 View  Result Date: 02/11/2021 CLINICAL DATA:  Shortness of breath, chest pain and leg swelling. EXAM: CHEST - 2 VIEW COMPARISON:  Radiograph 8 of 20 FINDINGS: Multilead left-sided pacemaker in place. Cardiomegaly which is likely increased. Peribronchial and interstitial thickening typical pulmonary edema. There is retrocardiac opacity prior suspect small right pleural effusion. Spinal stimulator in place. No visualized pneumothorax. IMPRESSION: 1. Cardiomegaly with pulmonary edema and small right pleural effusion, findings suspicious for CHF. 2. Retrocardiac opacity with a degree of underlying pleural fluid. There is associated basilar airspace disease which may be atelectasis or pneumonia. Electronically Signed   By: Keith Rake M.D.   On: 02/11/2021 19:08   ECHOCARDIOGRAM COMPLETE  Result Date: 02/12/2021    ECHOCARDIOGRAM REPORT   Patient Name:   Sara Graves Date of Exam: 02/12/2021 Medical Rec #:  [id number]       Height:       62.0 in Accession #:    [id number]      Weight:       191.2 lb Date of Birth:  04-Apr-1941       BSA:          1.875 m Patient Age:    74 years        BP:           151/55 mmHg Patient Gender: F               HR:           60 bpm. Exam Location:  Inpatient Procedure: Cardiac Doppler, Color Doppler and 2D Echo Indications:     I50.23 Acute on chronic systolic (congestive) heart failure  History:         Patient has prior history of Echocardiogram examinations, most                   recent 11/30/2019. CHF, CAD; Risk Factors:Dyslipidemia, Diabetes                  and Hypertension. Hypothyroidism. GERD.  Sonographer:     Tiffany Dance Referring Phys:  [id number] [place] [facility] Diagnosing Phys: Adrian Prows MD IMPRESSIONS  1. Left ventricular ejection fraction, by estimation, is 20 to 25%. Left ventricular ejection fraction by PLAX is 25 %. The left ventricle has severely decreased function. The left ventricle demonstrates global hypokinesis. The left ventricular internal  cavity size was moderately dilated. There is mild left ventricular hypertrophy.  Left ventricular diastolic function could not be evaluated.  2. Right ventricular systolic function is moderately reduced. The right ventricular size is mildly enlarged. There is severely elevated pulmonary artery systolic pressure. The estimated right ventricular systolic pressure is 36.1 mmHg.  3. Left atrial size was severely dilated.  4. Right atrial size was moderately dilated.  5. Mitral regurgitation probably related to annular dilatation. The mitral valve is normal in structure. Moderate to severe mitral valve regurgitation.  6. Tricuspid valve regurgitation is moderate to severe.  7. The aortic valve is tricuspid. Aortic valve regurgitation is mild.  8. The inferior vena cava is dilated in size with <50% respiratory variability, suggesting right atrial pressure of 15 mmHg. FINDINGS  Left Ventricle: Left ventricular ejection fraction, by estimation, is 20 to 25%. Left ventricular ejection fraction by PLAX is 25 %. The left ventricle has severely decreased function. The left ventricle demonstrates global hypokinesis. The left ventricular internal cavity size was moderately dilated. There is mild left ventricular hypertrophy. Abnormal (paradoxical) septal motion, consistent with left bundle branch block. Left ventricular diastolic function could not be evaluated due to mitral regurgitation (moderate or greater). Left ventricular diastolic function  could not be evaluated. Right Ventricle: The right ventricular size is mildly enlarged. No increase in right ventricular wall thickness. Right ventricular systolic function is moderately reduced. There is severely elevated pulmonary artery systolic pressure. The tricuspid regurgitant velocity is 3.64 m/s, and with an assumed right atrial pressure of 15 mmHg, the estimated right ventricular systolic pressure is 44.3 mmHg. Left Atrium: Left atrial size was severely dilated. Right Atrium: Right atrial size was moderately dilated. Pericardium: There is no evidence of pericardial effusion. Mitral Valve: Mitral regurgitation probably related to annular dilatation. The mitral valve is normal in structure. Moderate to severe mitral valve regurgitation, with centrally-directed jet. Tricuspid Valve: The tricuspid valve is normal in structure. Tricuspid valve regurgitation is moderate to severe. Aortic Valve: The aortic valve is tricuspid. Aortic valve regurgitation is mild. Aortic regurgitation PHT measures 373 msec. Pulmonic Valve: The pulmonic valve was grossly normal. Pulmonic valve regurgitation is trivial. Aorta: The aortic root is normal in size and structure. Venous: The inferior vena cava is dilated in size with less than 50% respiratory variability, suggesting right atrial pressure of 15 mmHg. IAS/Shunts: No atrial level shunt detected by color flow Doppler. Additional Comments: A device lead is visualized.  LEFT VENTRICLE PLAX 2D LV EF:         Left ventricular ejection fraction by PLAX is 25 %. LVIDd:         6.00 cm LVIDs:         5.30 cm LV PW:         1.30 cm LV IVS:        0.90 cm LVOT diam:     2.10 cm LV SV:         76 LV SV Index:   40 LVOT Area:     3.46 cm  RIGHT VENTRICLE            IVC RV Basal diam:  3.00 cm    IVC diam: 2.30 cm RV Mid diam:    2.30 cm RV S prime:     8.38 cm/s TAPSE (M-mode): 1.5 cm LEFT ATRIUM             Index       RIGHT ATRIUM           Index LA diam:  4.40 cm 2.35 cm/m  RA  Area:     20.00 cm LA Vol (A2C):   70.7 ml 37.70 ml/m RA Volume:   59.60 ml  31.78 ml/m LA Vol (A4C):   86.3 ml 46.01 ml/m LA Biplane Vol: 82.5 ml 43.99 ml/m  AORTIC VALVE LVOT Vmax:   96.15 cm/s LVOT Vmean:  65.000 cm/s LVOT VTI:    0.219 m AI PHT:      373 msec  AORTA Ao Root diam: 3.10 cm Ao Asc diam:  3.40 cm MITRAL VALVE                TRICUSPID VALVE MV Area (PHT): 3.31 cm     TR Peak grad:   53.0 mmHg MV Decel Time: 229 msec     TR Vmax:        364.00 cm/s MV E velocity: 114.00 cm/s MV A velocity: 76.00 cm/s   SHUNTS MV E/A ratio:  1.50         Systemic VTI:  0.22 m                             Systemic Diam: 2.10 cm Adrian Prows MD Electronically signed by Adrian Prows MD Signature Date/Time: 02/12/2021/5:29:10 PM    Final     Cardiac database: EKG: 02/11/2021: Atrial sensed ventricular paced rhythm no significant change compared to prior EKG  Echocardiogram: 02/12/2021: LVEF 20-25%, severely reduced left ventricular systolic function, global hypokinesis, LV size moderately dilated, mild LVH, right ventricular systolic function moderately reduced, RV size mildly enlarged, PASP 68 mmHg, severe left atrial dilatation, moderately dilated right atrium, moderate to severe MR, moderate to severe TR, mild AR, IVC dilated estimated RAP 15 mmHg.  Stress test: None  Heart catheterization: Coronary angiogram 02/08/2019: Distal left main 20% diffuse disease. Ostial LAD stent shows diffuse 20% in-stent restenosis (3.0 x 15 mm resolute Onyx on 12/29/2017). Mid LAD and mid to distal LAD there are tandem lesions 80% and 95%. Ramus intermediate ostial 95 to 99% stenosis. Previously placed RI stent widely patent ( 2.0 x 12 mm resolute on 04/26/2018). 30 to 40% tandem lesions in the RCA. Cutting Balloon angioplasty of the ramus intermediate ostial high-grade stenosis, 99% reduced to 0% and Cutting Balloon angioplasty of the mid LAD followed by stenting with 2.5 x 32 mm Synergy DES for high-grade 90% stenosis  reduced to 0% and TIMI-3 to TIMI-3 flow maintained in both lesions.  Carotid artery duplex 12/02/2018: No hemodynamically significant arterial disease in the internal carotid artery bilaterally. Minimal plaque noted bilateral carotid arteries. Antegrade right vertebral artery flow. Antegrade left vertebral artery flow.  Scheduled Meds: . aspirin EC  81 mg Oral Daily  . busPIRone  5 mg Oral BID  . clopidogrel  75 mg Oral Daily  . dapagliflozin propanediol  10 mg Oral Daily  . diclofenac Sodium  2 g Topical QID  . FLUoxetine  10 mg Oral Daily  . furosemide  40 mg Intravenous BID  . gabapentin  100 mg Oral QHS  . insulin aspart  0-5 Units Subcutaneous QHS  . insulin aspart  0-9 Units Subcutaneous TID WC  . isosorbide dinitrate  30 mg Oral TID  . levothyroxine  50 mcg Oral QAC breakfast  . magnesium oxide  400 mg Oral TID  . methocarbamol  500 mg Oral TID AC & HS  . metoprolol succinate  50 mg Oral Daily  . sacubitril-valsartan  1 tablet Oral  BID  . spironolactone  25 mg Oral Daily    Continuous Infusions:   PRN Meds: ALPRAZolam   IMPRESSION & RECOMMENDATIONS: Sara Graves is a 80 y.o. female whose past medical history and cardiac risk factors include: hypertension, insulin-dependent diabetes mellitus type 2, CAD s/p prior LAD and ramus PCI, HFrEF, ischemic cardiomyopathy, s/p BiV ICD implantation 06/2019,  h/o Rt leg cellulitis and metatarsal fracture (managed by Podiatry)  Impression: Acute on chronic heart failure reduced EF, stage C, NYHA class II Ischemic cardiomyopathy status post BiV ICD implantation Coronary artery atherosclerosis with prior PCI and without angina pectoris Left upper extremity swelling Insulin-dependent diabetes mellitus type 2.  BiV ICD in place. Mixed hyperlipidemia. History of left bundle branch block. Benign essential hypertension. Oxygen dependence.  Plan: Patient presents to the hospital for acute on chronic exacerbation of heart failure  with reduced EF.  Patient has been diuresed with parenteral medication is currently a net negative urine output of 5 L.  Clinically patient states that she is doing much better compared to her initial date.  She was able to work with physical therapy without having worsening effort related dyspnea.  She is currently back to her normal 2 L nasal cannula oxygen.  Echocardiogram results reviewed and findings noted above.  Currently on Arni, spironolactone, Toprol-XL, Farxiga, Lasix.  Recommended the importance of a low-salt diet.  Anticipate discharge in the next 24 hours.  We will check a.m. BMP, magnesium level, and BNP. Continue strict I's and O's and daily weight.  Due to the left upper extremity swelling I requested nursing staff to change IV access to the right arm.  We will also obtain upper extremity venous duplex.  Telemetry reviewed.  Overall a paced rhythm with occasional PVCs.  Since the patient is getting parenteral diuretics will check magnesium levels.  Recommend a potassium of 4 and magnesium of 2.  Continue to monitor her on telemetry  Appreciate primary team's assistance in the management of her other noncardiac comorbid conditions.  Patient's questions and concerns were addressed to her satisfaction. She voices understanding of the instructions provided during this encounter.   This note was created using a voice recognition software as a result there may be grammatical errors inadvertently enclosed that do not reflect the nature of this encounter. Every attempt is made to correct such errors.  Total time spent: 37 minutes  Kamonte Mcmichen Skelp, DO, [facility]. [place] Office: [phone] 02/13/2021, 2:04 PM

## 2021-02-13 NOTE — Evaluation (Signed)
Physical Therapy Evaluation Patient Details Name: Sara Graves MRN: [id number] DOB: 1940/11/30 Today's Date: 02/13/2021   History of Present Illness  Pt is a 80 y.o. female who presented 3/28 with edema and SOB with acute on chronic systolic heart failue decomensations. PMH: uncontrolled diabetes mellitus, ischemic cardiomyopathy with severe LV systolic dysfunction, underlying left bundle branch block and SP [employer] BiV ICD implantation on 06/21/2019, CAD s/p PCI and LAD stent, hypothyrodism, urinary incontinence, L rotator cuff tear, HTN.  Clinical Impression  Pt presents with condition above and deficits mentioned below, see PT Problem List. PTA, pt was mod I with all ADLs and functional mobility. She was able to ambulate longer community distances, even without UE support for periods of time, to navigate stores for grocery shopping using her rollator or the grocery cart for support. She reports recently being unable to stand long enough to cook much anymore though. Currently, pt is limited in mobility by her decreased activity tolerance, demonstrating SpO2 levels in high 80s-low 90s with 2-3L of O2 via [place]. In addition, she displays some generalized weakness and reports a hx of back pain that impacts her posture with gait. Pt requires min guard for transfers and short gait distances of ~70 ft with her rollator this date. Pt would greatly benefit from further diet and CHF education along with follow-up with a cardiopulm rehab program. Will continue to follow acutely.    Follow Up Recommendations Other (comment) (cardiopulm rehab program would be beneficial)    Equipment Recommendations  None recommended by PT    Recommendations for Other Services       Precautions / Restrictions Precautions Precautions: Fall Precaution Comments: monitor SpO2, urinary incontinence Restrictions Weight Bearing Restrictions: No      Mobility  Bed Mobility Overal bed mobility: Independent              General bed mobility comments: Pt able to perform all bed mobility with bed flat and no use of rails.    Transfers Overall transfer level: Needs assistance Equipment used: 4-wheeled walker Transfers: Sit to/from Stand Sit to Stand: Min guard         General transfer comment: Pt comes to stand with min guard and extra time. Requires cues to remember to lock rollator breaks prior to coming to stand or returning to sit.  Ambulation/Gait Ambulation/Gait assistance: Min guard Gait Distance (Feet): 70 Feet Assistive device: 4-wheeled walker Gait Pattern/deviations: Step-through pattern;Decreased step length - right;Decreased step length - left;Decreased stride length;Trunk flexed Gait velocity: reduced Gait velocity interpretation: <1.31 ft/sec, indicative of household ambulator General Gait Details: Ambulates at slow pace with kyphotic posture, intermittently leaning anteriorly on her forearms to rest. Reports some lightheadedness with upright postures, but improved as SpO2 increased.  Stairs            Wheelchair Mobility    Modified Rankin (Stroke Patients Only)       Balance Overall balance assessment: No apparent balance deficits (not formally assessed)                                           Pertinent Vitals/Pain Pain Assessment: 0-10 Pain Score: 7  Pain Location: R chest/ribs Pain Descriptors / Indicators: Sore;Discomfort;Grimacing;Guarding Pain Intervention(s): Limited activity within patient's tolerance;Monitored during session;Repositioned    Home Living Family/patient expects to be discharged to:: Private residence Living Arrangements: Spouse/significant other Available Help at Discharge:  Family;Available 24 hours/day Type of Home: House Home Access: Ramped entrance     Home Layout: One level Home Equipment: Shower seat;Bedside commode;Walker - 4 wheels;Walker - 2 wheels;Cane - single point (lended her w/c to her sister-in-law until  she recovers from a leg fx) Additional Comments: Husband with "bad knee", but can do light lifting.    Prior Function Level of Independence: Independent with assistive device(s)         Comments: Uses rollator in home and in community. Pt drives sometimes, but husband can drive. On 2L/min O2 via [place] at home.     Hand Dominance   Dominant Hand: Right    Extremity/Trunk Assessment   Upper Extremity Assessment Upper Extremity Assessment: Defer to OT evaluation    Lower Extremity Assessment Lower Extremity Assessment: Generalized weakness (MMT scores of 4 to 5 grossly throughout; denies numbness/tingling; no dysmetria or dysdiadochokinesia noted)    Cervical / Trunk Assessment Cervical / Trunk Assessment: Kyphotic  Communication   Communication: No difficulties  Cognition Arousal/Alertness: Awake/alert Behavior During Therapy: WFL for tasks assessed/performed Overall Cognitive Status: Within Functional Limits for tasks assessed                                        General Comments General comments (skin integrity, edema, etc.): On 2-3L/min O2 via [place], SpO2 high 80s- low 90s, cuing pt for proper nose inhalation and pursed lip exhalation with noted improved sats and decreased symptoms of lightheadedness    Exercises     Assessment/Plan    PT Assessment Patient needs continued PT services  PT Problem List Decreased strength;Decreased range of motion;Decreased activity tolerance;Decreased mobility;Decreased knowledge of use of DME;Decreased safety awareness;Cardiopulmonary status limiting activity       PT Treatment Interventions DME instruction;Gait training;Functional mobility training;Therapeutic activities;Therapeutic exercise;Balance training;Neuromuscular re-education;Patient/family education    PT Goals (Current goals can be found in the Care Plan section)  Acute Rehab PT Goals Patient Stated Goal: to get healthier and return to her baseline PT  Goal Formulation: With patient Time For Goal Achievement: 02/27/21 Potential to Achieve Goals: Good    Frequency Min 3X/week   Barriers to discharge        Co-evaluation               AM-PAC PT "6 Clicks" Mobility  Outcome Measure Help needed turning from your back to your side while in a flat bed without using bedrails?: None Help needed moving from lying on your back to sitting on the side of a flat bed without using bedrails?: None Help needed moving to and from a bed to a chair (including a wheelchair)?: A Little Help needed standing up from a chair using your arms (e.g., wheelchair or bedside chair)?: A Little Help needed to walk in hospital room?: A Little Help needed climbing 3-5 steps with a railing? : A Little 6 Click Score: 20    End of Session Equipment Utilized During Treatment: Gait belt;Oxygen Activity Tolerance: Patient limited by fatigue Patient left: in chair;with call bell/phone within reach Nurse Communication: Mobility status;Other (comment) (sats, no chair alarm in chair (chair was set-up prior to PT session)) PT Visit Diagnosis: Other abnormalities of gait and mobility (R26.89);Muscle weakness (generalized) (M62.81);Difficulty in walking, not elsewhere classified (R26.2)    Time: 0865-7846 PT Time Calculation (min) (ACUTE ONLY): 27 min   Charges:   PT Evaluation $PT Eval Low Complexity:  1 Low PT Treatments $Therapeutic Activity: 8-22 mins        Moishe Spice, PT, DPT Acute Rehabilitation Services  Pager: [phone] Office: [place] 02/13/2021, 11:21 AM

## 2021-02-13 NOTE — Progress Notes (Signed)
PROGRESS NOTE    Sara Graves  LFY:[id number] DOB: November 22, 1940 DOA: 02/11/2021 PCP: [name], PA-C    Brief Narrative:  Sara Graves was admitted to the hospital with a working diagnosis of acute on chronic systolic heart failure decompensation.  80 year old female past medical history for type 2 diabetes mellitus, ischemic cardiomyopathy, coronary artery disease status post PCI, systolic heart failure, status post BiV-ICD, and hypothyroidism. Patient reported worsening dyspnea on exertion, associated with orthopnea.  Increase use of home oxygen.  Positive for pound weight gain, associated with cough, and runny nose.  Patient admits recently dietary indiscretion. On her initial physical examination blood pressure 160/73, heart rate 58, respirate 18, oxygen saturation 96%, lungs are clear to auscultation bilaterally, heart S1-S2, present, rhythmic, abdomen soft, positive bilateral pitting edema at the lower extremities  Sodium 143, potassium 4.1, chloride 104, bicarb 33, glucose 194, BUN 14, creatinine 0.91, white count 4.4, hemoglobin 13.1, hematocrit 41.0, platelets 94. SARS COVID-19 negative.  Chest radiograph with cardiomegaly, bilateral hilar vascular congestion, interstitial infiltrate at the retrocardiac position.  Small right pleural effusion.  EKG 66 bpm, normal axis, left bundle branch block, sinus rhythm, no significant ST segment or T wave changes.  Patient placed on IV furosemide with improvement in her volume status.   Assessment & Plan:   Principal Problem:   Acute on chronic combined systolic and diastolic CHF (congestive heart failure) (HCC) Active Problems:   Hypothyroidism   DM (diabetes mellitus), type 2, uncontrolled (HCC)   Depression   Thrombocytopenia (HCC)   Coronary artery disease involving native coronary artery of native heart without angina pectoris   ICD [employer] [place] MRI CRTD 06/21/2019   Ischemic cardiomyopathy    1. Acute on  chronic systolic heart failure decompensation, pulmonary hypertension class 2, acute on chronic core pulmonale. Clinically improved volume status, decreased edema at the lower extremities, no JVD or rales anymore.   Urine output 4,250 cc since admission, blood pressure systolic 852 mmHg with warm lower extremities.  Stable HR in the 50's  Echocardiogram with reduced LV systolic function Ef 20 to 25% with global hypokinesis. Moderate reduction in RV systolic function, severe pulmonary hypertension, estimated RVSP 68 mmHg.   Tolerating will isosorbide dinitrate, metoprolol, entresto and spironolactone.   To consider transition to oral diuretic therapy in am.   2. CAD On dual antiplatelet therapy with aspirin and clopidogrel. No chest pain or angina. Has reproducible chest pain on palpation of right rib cage.   3. T2DM. Fasting glucose this am is 123, will continue with glucose cover and monitoring with insulin sliding scale, patient is tolerating po well.  Continue with dapagliflozin,   4. Hypothyroid.  On levothyroxine  5. Depression. On buspirone and fluoxetine. PRN alprazolam.     Status is: Inpatient  Remains inpatient appropriate because:IV treatments appropriate due to intensity of illness or inability to take PO   Dispo: The patient is from: Home              Anticipated d/c is to: Home              Patient currently is not medically stable to d/c.   Difficult to place patient No  DVT prophylaxis: Enoxaparin   Code Status:   full  Family Communication:  No family at the bedside      Consultants:   Cardiology     Subjective: Patient is feeling better, no nausea or vomiting, no chest pain, dyspnea and lower extremity edema is improving,  this am is out of bed to chair. Positive reproducible chest pain on the right rib cage.   Objective: Vitals:   02/12/21 1550 02/12/21 1947 02/12/21 2215 02/13/21 0352  BP: (!) 151/55 (!) 119/56 (!) 128/59 118/62  Pulse: 61  (!) 56 (!) 58 (!) 51  Resp: 18 19 19 18   Temp: 97.8 F (36.6 C) 98 F (36.7 C) 97.6 F (36.4 C) 98.6 F (37 C)  TempSrc: Oral Oral Oral Oral  SpO2: 97% 93% 98% 93%  Weight:    80.4 kg    Intake/Output Summary (Last 24 hours) at 02/13/2021 1155 Last data filed at 02/13/2021 5038 Gross per 24 hour  Intake 960 ml  Output 3550 ml  Net -2590 ml   Filed Weights   02/13/21 0352  Weight: 80.4 kg    Examination:   General: deconditioned  Neurology: Awake and alert, non focal  E ENT: no pallor, no icterus, oral mucosa moist Cardiovascular: No JVD. S1-S2 present, rhythmic, no gallops, rubs, or murmurs. ++ bilateral pitting lower extremity edema. Pulmonary: positive breath sounds bilaterally, adequate air movement, no wheezing, rhonchi or rales. Gastrointestinal. Abdomen soft and on tender Skin. No rashes Musculoskeletal: no joint deformities     Data Reviewed: I have personally reviewed following labs and imaging studies  CBC: Recent Labs  Lab 02/11/21 1755 02/12/21 0319  WBC 4.4 4.1  HGB 13.1 11.9*  HCT 41.0 36.5  MCV 98.8 98.1  PLT 94* 92*   Basic Metabolic Panel: Recent Labs  Lab 02/11/21 1755 02/12/21 0319 02/13/21 0218  NA 143 141 140  K 4.1 3.8 3.9  CL 104 103 98  CO2 33* 31 36*  GLUCOSE 184* 149* 123*  BUN 14 12 15   CREATININE 0.91 0.86 1.11*  CALCIUM 9.6 9.0 9.1   GFR: Estimated Creatinine Clearance: 40.4 mL/min (A) (by C-G formula based on SCr of 1.11 mg/dL (H)). Liver Function Tests: No results for input(s): AST, ALT, ALKPHOS, BILITOT, PROT, ALBUMIN in the last 168 hours. No results for input(s): LIPASE, AMYLASE in the last 168 hours. No results for input(s): AMMONIA in the last 168 hours. Coagulation Profile: No results for input(s): INR, PROTIME in the last 168 hours. Cardiac Enzymes: No results for input(s): CKTOTAL, CKMB, CKMBINDEX, TROPONINI in the last 168 hours. BNP (last 3 results) Recent Labs    08/14/20 1609 08/29/20 1559  11/14/20 1335  PROBNP 7,991* 3,594* 10,511*   HbA1C: Recent Labs    02/12/21 0319  HGBA1C 9.8*   CBG: Recent Labs  Lab 02/12/21 1125 02/12/21 1613 02/12/21 2114 02/13/21 0604 02/13/21 1113  GLUCAP 131* 170* 162* 112* 202*   Lipid Profile: No results for input(s): CHOL, HDL, LDLCALC, TRIG, CHOLHDL, LDLDIRECT in the last 72 hours. Thyroid Function Tests: No results for input(s): TSH, T4TOTAL, FREET4, T3FREE, THYROIDAB in the last 72 hours. Anemia Panel: No results for input(s): VITAMINB12, FOLATE, FERRITIN, TIBC, IRON, RETICCTPCT in the last 72 hours.    Radiology Studies: I have reviewed all of the imaging during this hospital visit personally     Scheduled Meds: . aspirin EC  81 mg Oral Daily  . busPIRone  5 mg Oral BID  . clopidogrel  75 mg Oral Daily  . dapagliflozin propanediol  10 mg Oral Daily  . FLUoxetine  10 mg Oral Daily  . furosemide  40 mg Intravenous BID  . gabapentin  100 mg Oral QHS  . insulin aspart  0-5 Units Subcutaneous QHS  . insulin aspart  0-9 Units Subcutaneous  TID WC  . isosorbide dinitrate  30 mg Oral TID  . levothyroxine  50 mcg Oral QAC breakfast  . magnesium oxide  400 mg Oral TID  . methocarbamol  500 mg Oral TID AC & HS  . metoprolol succinate  50 mg Oral Daily  . sacubitril-valsartan  1 tablet Oral BID  . spironolactone  25 mg Oral Daily   Continuous Infusions:   LOS: 1 day        Jayon Matton Gerome Apley, MD

## 2021-02-13 NOTE — Progress Notes (Signed)
Heart Failure Stewardship Pharmacist Progress Note   PCP: [name], PA-C PCP-Cardiologist: No primary care provider on file.    HPI:  80 yo F with PMH of T2DM, ICM, CAD, CHF, and hypothyroidism. She presented to the ED on 02/11/21 with edema and shortness of breath. An ECHO was done on 02/12/21 and LVEF was 20-25% (stable from last ECHO in Jan 2021).   Current HF Medications: Furosemide 40 mg IV BID Metoprolol XL 50 mg daily Entresto 97/103 mg BID Spironolactone 25 mg daily Farxiga 10 mg daily Isordil 30 mg TID  Prior to admission HF Medications: Torsemide 10 mg daily Metoprolol XL 50 mg daily Entresto 97/103 mg BID Spironolactone 25 mg daily Hydralazine 50 mg TID Isordil 30 mg TID  Pertinent Lab Values: . Serum creatinine 1.11, BUN 15, Potassium 3.9, Sodium 140, BNP 4088.9   Vital Signs: . Weight: 177 lbs (admission weight: 177 lbs) . Blood pressure: 100-120/60s  . Heart rate: 50s   Medication Assistance / Insurance Benefits Check: Does the patient have prescription insurance?  Yes Type of insurance plan: [employer] Medicare  Does the patient qualify for medication assistance through manufacturers or grants?   Pending . Eligible grants and/or patient assistance programs: pending . Medication assistance applications in progress: none  . Medication assistance applications approved: none Approved medication assistance renewals will be completed by: pending  Outpatient Pharmacy:  [facility] admission [facility] Is the patient willing to use [facility] at discharge? Yes Is the patient willing to transition their outpatient pharmacy to utilize a [facility] outpatient pharmacy?   Pending    Assessment: 1. Acute on chronic systolic CHF (EF 63-81%), due to ICM. NYHA class III symptoms. - Continue furosemide 40 mg IV BID - Continue metoprolol XL 50 mg daily - Continue Entresto 97/103 mg BID - Continue spironolactone 25 mg daily - Continue Farxiga 10  mg daily - PTA hydralazine on hold with low BP - Continue Isordil 30 mg TID  Plan: 1) Medication changes recommended at this time: - Continue current regimen   2) Patient assistance: Wilder Glade copay $67 - Appreciate assistance from Willette Alma, [place], PharmD, BCPS Heart Failure Stewardship Pharmacist Phone [phone]

## 2021-02-14 ENCOUNTER — Inpatient Hospital Stay (HOSPITAL_COMMUNITY): Payer: Medicare PPO

## 2021-02-14 DIAGNOSIS — M7989 Other specified soft tissue disorders: Secondary | ICD-10-CM

## 2021-02-14 DIAGNOSIS — Z9981 Dependence on supplemental oxygen: Secondary | ICD-10-CM

## 2021-02-14 DIAGNOSIS — N182 Chronic kidney disease, stage 2 (mild): Secondary | ICD-10-CM

## 2021-02-14 DIAGNOSIS — R609 Edema, unspecified: Secondary | ICD-10-CM | POA: Diagnosis not present

## 2021-02-14 DIAGNOSIS — I5023 Acute on chronic systolic (congestive) heart failure: Secondary | ICD-10-CM

## 2021-02-14 LAB — GLUCOSE, CAPILLARY
Glucose-Capillary: 145 mg/dL — ABNORMAL HIGH (ref 70–99)
Glucose-Capillary: 218 mg/dL — ABNORMAL HIGH (ref 70–99)
Glucose-Capillary: 204 mg/dL — ABNORMAL HIGH (ref 70–99)
Glucose-Capillary: 167 mg/dL — ABNORMAL HIGH (ref 70–99)

## 2021-02-14 LAB — BASIC METABOLIC PANEL
Anion gap: 7 (ref 5–15)
BUN: 21 mg/dL (ref 8–23)
CO2: 35 mmol/L — ABNORMAL HIGH (ref 22–32)
Calcium: 8.7 mg/dL — ABNORMAL LOW (ref 8.9–10.3)
Chloride: 97 mmol/L — ABNORMAL LOW (ref 98–111)
Creatinine, Ser: 1.34 mg/dL — ABNORMAL HIGH (ref 0.44–1.00)
GFR, Estimated: 40 mL/min — ABNORMAL LOW (ref >60)
Glucose, Bld: 205 mg/dL — ABNORMAL HIGH (ref 70–99)
Potassium: 3.7 mmol/L (ref 3.5–5.1)
Sodium: 139 mmol/L (ref 135–145)

## 2021-02-14 LAB — BRAIN NATRIURETIC PEPTIDE: B Natriuretic Peptide: 1477.4 pg/mL — ABNORMAL HIGH (ref 0.0–100.0)

## 2021-02-14 LAB — MAGNESIUM: Magnesium: 1.8 mg/dL (ref 1.7–2.4)

## 2021-02-14 MED ORDER — FUROSEMIDE 40 MG PO TABS
40.0000 mg | ORAL_TABLET | Freq: Every day | ORAL | Status: DC
  Administered 2021-02-15: 40 mg via ORAL
  Filled 2021-02-14: qty 1

## 2021-02-14 NOTE — Progress Notes (Signed)
Heart Failure Stewardship Pharmacist Progress Note   PCP: [name], PA-C PCP-Cardiologist: No primary care provider on file.    HPI:  80 yo F with PMH of T2DM, ICM, CAD, CHF, and hypothyroidism. She presented to the ED on 02/11/21 with edema and shortness of breath. An ECHO was done on 02/12/21 and LVEF was 20-25% (stable from last ECHO in Jan 2021).   Current HF Medications: Furosemide 40 mg PO daily Metoprolol XL 50 mg daily Entresto 97/103 mg BID Spironolactone 25 mg daily Farxiga 10 mg daily Isordil 30 mg TID  Prior to admission HF Medications: Torsemide 10 mg daily Metoprolol XL 50 mg daily Entresto 97/103 mg BID Spironolactone 25 mg daily Hydralazine 50 mg TID Isordil 30 mg TID  Pertinent Lab Values: . Serum creatinine 1.34, BUN 21, Potassium 3.7, Sodium 137, BNP 4088.9, Magnesium 1.8  Vital Signs: . Weight: 172 lbs (admission weight: 177 lbs) . Blood pressure: 120/40s  . Heart rate: 50s   Medication Assistance / Insurance Benefits Check: Does the patient have prescription insurance?  Yes Type of insurance plan: [employer] Medicare  Does the patient qualify for medication assistance through manufacturers or grants?   Pending . Eligible grants and/or patient assistance programs: pending . Medication assistance applications in progress: none  . Medication assistance applications approved: none Approved medication assistance renewals will be completed by: pending  Outpatient Pharmacy:  [facility] Is the patient willing to use [facility] at discharge? Yes Is the patient willing to transition their outpatient pharmacy to utilize a [facility] outpatient pharmacy?   Pending    Assessment: 1. Acute on chronic systolic CHF (EF 86-76%), due to ICM. NYHA class II symptoms. - Agree with transitioning to furosemide 40 mg PO daily - Continue metoprolol XL 50 mg daily - Continue Entresto 97/103 mg BID - Continue spironolactone 25  mg daily - Continue Farxiga 10 mg daily - PTA hydralazine on hold with low BP - Continue Isordil 30 mg TID  Plan: 1) Medication changes recommended at this time: - Continue current regimen   2) Patient assistance: Wilder Glade copay $67 - Appreciate assistance from Willette Alma, [place], PharmD, BCPS Heart Failure Stewardship Pharmacist Phone [phone]

## 2021-02-14 NOTE — Progress Notes (Signed)
VASCULAR LAB    Left upper extremity venous duplex has been performed.  See [facility] proc for preliminary results.   Carmelia Tiner, RVT 02/14/2021, 9:55 AM

## 2021-02-14 NOTE — Progress Notes (Signed)
Progress Note  Patient Name: Sara Graves Date of Encounter: 02/14/2021  Attending physician: Tawni Millers Primary care provider: [name], PA-C Primary Cardiologist: Rex Kras, DO  Subjective: Sara Graves is a 80 y.o. female who was seen and examined at bedside at approximately 856am No events overnight. Denies shortness of breath or chest pain.  Lower extremity swelling improved. Currently on nasal cannula oxygen at her home dose.   Objective: Vital Signs in the last 24 hours: Temp:  [97.6 F (36.4 C)-98.4 F (36.9 C)] 98.4 F (36.9 C) (03/31 0559) Pulse Rate:  [53-63] 55 (03/31 0559) Resp:  [16-18] 16 (03/31 0559) BP: (92-128)/(40-42) 123/42 (03/31 0559) SpO2:  [92 %-96 %] 92 % (03/31 0559) Weight:  [172.1 kg] 172.1 kg (03/31 0559)  Intake/Output:  Intake/Output Summary (Last 24 hours) at 02/14/2021 0858 Last data filed at 02/14/2021 0800 Gross per 24 hour  Intake 480 ml  Output 3350 ml  Net -2870 ml    Net IO Since Admission: -8,060 mL [02/14/21 0858]  Weights:  Filed Weights   02/13/21 0352 02/14/21 0559  Weight: 80.4 kg (!) 172.1 kg    Telemetry: Personally reviewed.  Paced rhythm without ectopy  Physical examination: PHYSICAL EXAM: Vitals with BMI 02/14/2021 02/13/2021 02/13/2021  Height - - -  Weight 379 lbs 7 oz - -  BMI 70.62 - -  Systolic 376 283 151  Diastolic 42 42 40  Pulse 55 53 63    CONSTITUTIONAL: Age-appropriate female, hemodynamically stable, no acute distress. SKIN: Skin is warm and dry. No rash noted. No cyanosis. No pallor. No jaundice HEAD: Normocephalic and atraumatic.  EYES: No scleral icterus MOUTH/THROAT: Moist oral membranes.  NECK: No JVD present. No thyromegaly noted. No carotid bruits  LYMPHATIC: No visible cervical adenopathy.  CHEST Normal respiratory effort. No intercostal retractions  LUNGS: Clear to auscultation bilaterally.  No stridor. No wheezes. No rales.  CARDIOVASCULAR: Regular,  positive V6-H6, soft holosystolic murmur heard at the apex, no gallops or rubs. ABDOMINAL: Soft, nontender, nondistended, positive bowel sounds in all 4 quadrants, no apparent ascites.  EXTREMITIES: No peripheral edema.  Left upper extremity swollen compared to the right. HEMATOLOGIC: No significant bruising NEUROLOGIC: Oriented to person, place, and time. Nonfocal. Normal muscle tone.  PSYCHIATRIC: Normal mood and affect. Normal behavior. Cooperative  Lab Results: Hematology Recent Labs  Lab 02/11/21 1755 02/12/21 0319  WBC 4.4 4.1  RBC 4.15 3.72*  HGB 13.1 11.9*  HCT 41.0 36.5  MCV 98.8 98.1  MCH 31.6 32.0  MCHC 32.0 32.6  RDW 14.6 14.6  PLT 94* 92*    Chemistry Recent Labs  Lab 02/12/21 0319 02/13/21 0218 02/14/21 0026  NA 141 140 139  K 3.8 3.9 3.7  CL 103 98 97*  CO2 31 36* 35*  GLUCOSE 149* 123* 205*  BUN 12 15 21   CREATININE 0.86 1.11* 1.34*  CALCIUM 9.0 9.1 8.7*  GFRNONAA >60 51* 40*  ANIONGAP 7 6 7      Cardiac Enzymes: Cardiac Panel (last 3 results) Recent Labs    02/11/21 1755 02/11/21 2053  TROPONINIHS 8 9    BNP (last 3 results) Recent Labs    02/11/21 2256 02/13/21 0218 02/14/21 0026  BNP 4,088.9* 2,726.8* 1,477.4*    ProBNP (last 3 results) Recent Labs    08/14/20 1609 08/29/20 1559 11/14/20 1335  PROBNP 7,991* 3,594* 10,511*     DDimer No results for input(s): DDIMER in the last 168 hours.   Hemoglobin A1c:  Lab  Results  Component Value Date   HGBA1C 9.8 (H) 02/12/2021   MPG 234.56 02/12/2021    TSH No results for input(s): TSH in the last 8760 hours.  Lipid Panel     Component Value Date/Time   CHOL 171 02/07/2020 1524   TRIG 62 02/07/2020 1524   HDL 55 02/07/2020 1524   CHOLHDL 2.9 12/26/2019 1407   CHOLHDL 3.9 03/25/2015 0325   VLDL 23 03/25/2015 0325   LDLCALC 104 (H) 02/07/2020 1524   LDLDIRECT 90 04/08/2012 1414    Imaging: DG Chest 2 View  Result Date: 02/13/2021 CLINICAL DATA:  Shortness of breath.  EXAM: CHEST - 2 VIEW COMPARISON:  07/06/2021. FINDINGS: AICD in stable position. Stable cardiomegaly. Persistent left base infiltrate/edema and left-sided pleural effusion. No pneumothorax. Postsurgical changes right shoulder. Neurostimulator noted stable position. IMPRESSION: 1.  AICD in stable position.  Stable cardiomegaly. 2. Persistent left base infiltrate/edema and left-sided pleural effusion. No interim change. Electronically Signed   By: Marcello Moores  Register   On: 02/13/2021 06:58   ECHOCARDIOGRAM COMPLETE  Result Date: 02/12/2021    ECHOCARDIOGRAM REPORT   Patient Name:   Sara Graves Date of Exam: 02/12/2021 Medical Rec #:  [id number]       Height:       62.0 in Accession #:    [id number]      Weight:       191.2 lb Date of Birth:  Jan 23, 1941       BSA:          1.875 m Patient Age:    26 years        BP:           151/55 mmHg Patient Gender: F               HR:           60 bpm. Exam Location:  Inpatient Procedure: Cardiac Doppler, Color Doppler and 2D Echo Indications:     I50.23 Acute on chronic systolic (congestive) heart failure  History:         Patient has prior history of Echocardiogram examinations, most                  recent 11/30/2019. CHF, CAD; Risk Factors:Dyslipidemia, Diabetes                  and Hypertension. Hypothyroidism. GERD.  Sonographer:     Tiffany Dance Referring Phys:  [id number] [facility] Diagnosing Phys: Adrian Prows MD IMPRESSIONS  1. Left ventricular ejection fraction, by estimation, is 20 to 25%. Left ventricular ejection fraction by PLAX is 25 %. The left ventricle has severely decreased function. The left ventricle demonstrates global hypokinesis. The left ventricular internal  cavity size was moderately dilated. There is mild left ventricular hypertrophy. Left ventricular diastolic function could not be evaluated.  2. Right ventricular systolic function is moderately reduced. The right ventricular size is mildly enlarged. There is severely elevated pulmonary artery systolic  pressure. The estimated right ventricular systolic pressure is 27.5 mmHg.  3. Left atrial size was severely dilated.  4. Right atrial size was moderately dilated.  5. Mitral regurgitation probably related to annular dilatation. The mitral valve is normal in structure. Moderate to severe mitral valve regurgitation.  6. Tricuspid valve regurgitation is moderate to severe.  7. The aortic valve is tricuspid. Aortic valve regurgitation is mild.  8. The inferior vena cava is dilated in size with <50% respiratory variability, suggesting right atrial pressure  of 15 mmHg. FINDINGS  Left Ventricle: Left ventricular ejection fraction, by estimation, is 20 to 25%. Left ventricular ejection fraction by PLAX is 25 %. The left ventricle has severely decreased function. The left ventricle demonstrates global hypokinesis. The left ventricular internal cavity size was moderately dilated. There is mild left ventricular hypertrophy. Abnormal (paradoxical) septal motion, consistent with left bundle branch block. Left ventricular diastolic function could not be evaluated due to mitral regurgitation (moderate or greater). Left ventricular diastolic function could not be evaluated. Right Ventricle: The right ventricular size is mildly enlarged. No increase in right ventricular wall thickness. Right ventricular systolic function is moderately reduced. There is severely elevated pulmonary artery systolic pressure. The tricuspid regurgitant velocity is 3.64 m/s, and with an assumed right atrial pressure of 15 mmHg, the estimated right ventricular systolic pressure is 77.4 mmHg. Left Atrium: Left atrial size was severely dilated. Right Atrium: Right atrial size was moderately dilated. Pericardium: There is no evidence of pericardial effusion. Mitral Valve: Mitral regurgitation probably related to annular dilatation. The mitral valve is normal in structure. Moderate to severe mitral valve regurgitation, with centrally-directed jet. Tricuspid  Valve: The tricuspid valve is normal in structure. Tricuspid valve regurgitation is moderate to severe. Aortic Valve: The aortic valve is tricuspid. Aortic valve regurgitation is mild. Aortic regurgitation PHT measures 373 msec. Pulmonic Valve: The pulmonic valve was grossly normal. Pulmonic valve regurgitation is trivial. Aorta: The aortic root is normal in size and structure. Venous: The inferior vena cava is dilated in size with less than 50% respiratory variability, suggesting right atrial pressure of 15 mmHg. IAS/Shunts: No atrial level shunt detected by color flow Doppler. Additional Comments: A device lead is visualized.  LEFT VENTRICLE PLAX 2D LV EF:         Left ventricular ejection fraction by PLAX is 25 %. LVIDd:         6.00 cm LVIDs:         5.30 cm LV PW:         1.30 cm LV IVS:        0.90 cm LVOT diam:     2.10 cm LV SV:         76 LV SV Index:   40 LVOT Area:     3.46 cm  RIGHT VENTRICLE            IVC RV Basal diam:  3.00 cm    IVC diam: 2.30 cm RV Mid diam:    2.30 cm RV S prime:     8.38 cm/s TAPSE (M-mode): 1.5 cm LEFT ATRIUM             Index       RIGHT ATRIUM           Index LA diam:        4.40 cm 2.35 cm/m  RA Area:     20.00 cm LA Vol (A2C):   70.7 ml 37.70 ml/m RA Volume:   59.60 ml  31.78 ml/m LA Vol (A4C):   86.3 ml 46.01 ml/m LA Biplane Vol: 82.5 ml 43.99 ml/m  AORTIC VALVE LVOT Vmax:   96.15 cm/s LVOT Vmean:  65.000 cm/s LVOT VTI:    0.219 m AI PHT:      373 msec  AORTA Ao Root diam: 3.10 cm Ao Asc diam:  3.40 cm MITRAL VALVE                TRICUSPID VALVE MV Area (PHT): 3.31 cm  TR Peak grad:   53.0 mmHg MV Decel Time: 229 msec     TR Vmax:        364.00 cm/s MV E velocity: 114.00 cm/s MV A velocity: 76.00 cm/s   SHUNTS MV E/A ratio:  1.50         Systemic VTI:  0.22 m                             Systemic Diam: 2.10 cm Adrian Prows MD Electronically signed by Adrian Prows MD Signature Date/Time: 02/12/2021/5:29:10 PM    Final     Cardiac database: EKG: 02/11/2021: Atrial  sensed ventricular paced rhythm no significant change compared to prior EKG  Echocardiogram: 02/12/2021: LVEF 20-25%, severely reduced left ventricular systolic function, global hypokinesis, LV size moderately dilated, mild LVH, right ventricular systolic function moderately reduced, RV size mildly enlarged, PASP 68 mmHg, severe left atrial dilatation, moderately dilated right atrium, moderate to severe MR, moderate to severe TR, mild AR, IVC dilated estimated RAP 15 mmHg.  Stress test: None  Heart catheterization: Coronary angiogram 02/08/2019: Distal left main 20% diffuse disease. Ostial LAD stent shows diffuse 20% in-stent restenosis (3.0 x 15 mm resolute Onyx on 12/29/2017). Mid LAD and mid to distal LAD there are tandem lesions 80% and 95%. Ramus intermediate ostial 95 to 99% stenosis. Previously placed RI stent widely patent ( 2.0 x 12 mm resolute on 04/26/2018). 30 to 40% tandem lesions in the RCA. Cutting Balloon angioplasty of the ramus intermediate ostial high-grade stenosis, 99% reduced to 0% and Cutting Balloon angioplasty of the mid LAD followed by stenting with 2.5 x 32 mm Synergy DES for high-grade 90% stenosis reduced to 0% and TIMI-3 to TIMI-3 flow maintained in both lesions.  Carotid artery duplex 12/02/2018: No hemodynamically significant arterial disease in the internal carotid artery bilaterally. Minimal plaque noted bilateral carotid arteries. Antegrade right vertebral artery flow. Antegrade left vertebral artery flow.   Scheduled Meds: . aspirin EC  81 mg Oral Daily  . busPIRone  5 mg Oral BID  . clopidogrel  75 mg Oral Daily  . dapagliflozin propanediol  10 mg Oral Daily  . diclofenac Sodium  2 g Topical QID  . FLUoxetine  10 mg Oral Daily  . gabapentin  100 mg Oral QHS  . insulin aspart  0-5 Units Subcutaneous QHS  . insulin aspart  0-9 Units Subcutaneous TID WC  . isosorbide dinitrate  30 mg Oral TID  . levothyroxine  50 mcg Oral QAC breakfast  .  magnesium oxide  400 mg Oral TID  . methocarbamol  500 mg Oral TID AC & HS  . metoprolol succinate  50 mg Oral Daily  . sacubitril-valsartan  1 tablet Oral BID  . spironolactone  25 mg Oral Daily    Continuous Infusions:   PRN Meds: ALPRAZolam   IMPRESSION & RECOMMENDATIONS: Sara Graves is a 80 y.o. female whose past medical history and cardiac risk factors include: hypertension, insulin-dependent diabetes mellitus type 2, CAD s/p prior LAD and ramus PCI, HFrEF, ischemic cardiomyopathy, s/p BiV ICD implantation 06/2019, h/o Rt leg cellulitis and metatarsal fracture (managed by Podiatry).  Impression: Acute on chronic heart failure reduced EF, stage C, NYHA class II Ischemic cardiomyopathy status post BiV ICD implantation Coronary artery atherosclerosis with prior PCI and without angina pectoris Left upper extremity swelling Insulin-dependent diabetes mellitus type 2.  BiV ICD in place. Mixed hyperlipidemia. History of left bundle branch block.  Benign essential hypertension. Oxygen dependence.  Plan:  Since hospitalization patient has diuresed approximately net -8 L since admission.  Patient feels that she is back to her baseline her oxygen requirements have also improved.  Patient states that she is ready to go home. Continue current medical therapy which includes Arni, spironolactone, Toprol-XL, Farxiga, Lasix. Transition IV Lasix to p.o. BNP this morning improved compared to admission. Morning weight 172 kg. Patient is educated on importance of a low-salt diet. Strict I's and O's and daily weights.  Upper extremity duplex pending. If, Venous duplex is negative may d/c from cardiology standpoint.   Telemetry reviewed.  Transition of care visit scheduled for February 22, 2021 at 1:30 PM.  Discharge paperwork updated.  Appreciate primary team's assistance in the management of her noncardiac comorbid conditions.  Patient's questions and concerns were addressed to her  satisfaction. She voices understanding of the instructions provided during this encounter.   This note was created using a voice recognition software as a result there may be grammatical errors inadvertently enclosed that do not reflect the nature of this encounter. Every attempt is made to correct such errors.  Total time spent: 35 minutes.   Rex Kras, DO, [facility]. [place] Office: [phone] 02/14/2021, 8:58 AM

## 2021-02-14 NOTE — Progress Notes (Signed)
Inpatient Diabetes Program Recommendations  AACE/ADA: New Consensus Statement on Inpatient Glycemic Control (2015)  Target Ranges:  Prepandial:   less than 140 mg/dL      Peak postprandial:   less than 180 mg/dL (1-2 hours)      Critically ill patients:  140 - 180 mg/dL   Results for JANELLIE, TENNISON (MRN [id number]) as of 02/14/2021 11:41  Ref. Range 02/13/2021 06:04 02/13/2021 11:13 02/13/2021 16:11 02/13/2021 22:01 02/14/2021 05:57 02/14/2021 11:22  Glucose-Capillary Latest Ref Range: 70 - 99 mg/dL 112 (H) 202 (H) 193 (H) 270 (H) 145 (H) 218 (H)   Review of Glycemic Control  Diabetes history: DM2 Outpatient Diabetes medications: V-Go20 insulin pump with Lantus in the pump but not had for several months Current orders for Inpatient glycemic control:  Farxiga 10 mg daily, Novolog 0-9 units TID with meals, Novolog 0-5 units QHS   Inpatient Diabetes Program Recommendations:    Insulin: Please consider ordering Novolog 3 units TID with meals for meal coverage if patient eats at least 50% of meals.  Thanks, Barnie Alderman, RN, MSN, CDE Diabetes Coordinator Inpatient Diabetes Program [phone] (Team Pager from 8am to 5pm)

## 2021-02-14 NOTE — Progress Notes (Addendum)
PROGRESS NOTE    Sara Graves  QQI:[id number] DOB: 27-Apr-1941 DOA: 02/11/2021 PCP: [name], PA-C    Brief Narrative:  Mrs. Peedin was admitted to the hospital with a working diagnosis of acute on chronic systolic heart failure decompensation.  80 year old female past medical history for type 2 diabetes mellitus, ischemic cardiomyopathy,coronary artery disease status post PCI, systolic heart failure, status post BiV-ICD, and hypothyroidism. Patient reported worsening dyspnea on exertion, associated with orthopnea. Increase use of home oxygen. Positive 4 pound weight gain, associated with cough, and runny nose. Patient admits recently dietary indiscretion. On her initial physical examination blood pressure 160/73, heart rate 58, respiratory rate 18, oxygen saturation 96%, lungs are clear to auscultation bilaterally, heart S1-S2, present, rhythmic, abdomen soft, positive bilateral pitting edema at the lower extremities  Sodium 143, potassium 4.1, chloride 104, bicarb 33, glucose 194, BUN 14, creatinine 0.91, white count 4.4, hemoglobin 13.1, hematocrit 41.0, platelets 94. SARS COVID-19 negative.  Chest radiograph with cardiomegaly, bilateral hilar vascular congestion, interstitial infiltrate at the retrocardiac position. Small right pleural effusion.  EKG 66 bpm, normal axis, left bundle branch block, sinus rhythm, no significant ST segment or T wave changes.  Patient placed on IV furosemide with improvement in her volume status.   Pending improvement on renal function in order to discharge home.   Assessment & Plan:   Principal Problem:   Acute on chronic HFrEF (heart failure with reduced ejection fraction) (HCC) Active Problems:   Hypothyroidism   DM (diabetes mellitus), type 2, uncontrolled (HCC)   Depression   Coronary artery disease involving native coronary artery of native heart without angina pectoris   ICD [employer] [place] MRI CRTD 06/21/2019   Ischemic  cardiomyopathy   Left upper extremity swelling   Dependence on continuous supplemental oxygen   CKD (chronic kidney disease), stage II    1. Acute on chronic systolic heart failure decompensation,  pulmonary hypertension class 2, acute on chronic core pulmonale.  Echocardiogram with reduced LV systolic function Ef 20 to 25% with global hypokinesis. Moderate reduction in RV systolic function, severe pulmonary hypertension, estimated RVSP 68 mmHg.   Negative fluids balance has been achieved (since admission - 8,100 ml) with significant improvement in her symptoms. Urine output over last 24 hrs, 3.150 ml,   Continue heart failure management with isosorbide dinitrate, metoprolol, entresto and spironolactone.  Added SGLT2 inh with good toleration.   Hold on furosemide today and transition to oral in, if improvement in renal function.  Out of bed to chair, PT and OT.   2. NEW AKI on CKD stage 2. Serum cr today 1,34 with K at 3,7 and serum bicarbonate at 35. Metabolic alkalosis. Now euvolemic, will hold on furosemide for today and resume oral in am.   3. CAD continue with  dual antiplatelet therapy with aspirin and clopidogrel. Reproducible chest pain improved with topical diclofenac.   4. T2DM with hyperglycemia. Fasting glucose is 205,  will continue with glucose cover and monitoring with insulin sliding scale, Started on dapagliflozin,   5. Hypothyroid.  continue with levothyroxine  6. Depression. continue with buspirone and fluoxetine. PRN alprazolam for anxiety.   7. Left upper extremity edema. Follow with [place] today.   8. Thrombocytopenia, stable platelet count in the 90's.   Patient continue to be at high risk for worsening renal function   Status is: Inpatient  Remains inpatient appropriate because:Inpatient level of care appropriate due to severity of illness   Dispo: The patient is from: Home  Anticipated d/c is to: Home              Patient currently  is not medically stable to d/c.   Difficult to place patient No   DVT prophylaxis: Enoxaparin   Code Status:    full  Family Communication:  No family at the bedside       Consultants:   Cardiology     Subjective: Patient with improvement in dyspnea and lower extremity edema, chest pain improved with local analgesics, no nausea or vomiting, continue to be very weak and deconditioned   Objective: Vitals:   02/13/21 1226 02/13/21 1817 02/13/21 2326 02/14/21 0559  BP: (!) 92/40 (!) 128/40 (!) 118/42 (!) 123/42  Pulse: (!) 53 63 (!) 53 (!) 55  Resp: 18 16 16 16   Temp: 97.6 F (36.4 C) 98.3 F (36.8 C) 97.8 F (36.6 C) 98.4 F (36.9 C)  TempSrc: Oral Oral Oral Oral  SpO2: 93% 96% 92% 92%  Weight:    (!) 172.1 kg    Intake/Output Summary (Last 24 hours) at 02/14/2021 0910 Last data filed at 02/14/2021 0800 Gross per 24 hour  Intake 480 ml  Output 3350 ml  Net -2870 ml   Filed Weights   02/13/21 0352 02/14/21 0559  Weight: 80.4 kg (!) 172.1 kg    Examination:   General: deconditioned  Neurology: Awake and alert, non focal  E ENT: mild pallor, no icterus, oral mucosa moist Cardiovascular: No JVD. S1-S2 present, rhythmic, no gallops, rubs, or murmurs. No lower extremity edema. Pulmonary: positive breath sounds bilaterally, with no wheezing, rhonchi or rales. Gastrointestinal. Abdomen soft and non tender \\Skin . No rashes Musculoskeletal: no joint deformities     Data Reviewed: I have personally reviewed following labs and imaging studies  CBC: Recent Labs  Lab 02/11/21 1755 02/12/21 0319  WBC 4.4 4.1  HGB 13.1 11.9*  HCT 41.0 36.5  MCV 98.8 98.1  PLT 94* 92*   Basic Metabolic Panel: Recent Labs  Lab 02/11/21 1755 02/12/21 0319 02/13/21 0218 02/13/21 1430 02/14/21 0026  NA 143 141 140  --  139  K 4.1 3.8 3.9  --  3.7  CL 104 103 98  --  97*  CO2 33* 31 36*  --  35*  GLUCOSE 184* 149* 123*  --  205*  BUN 14 12 15   --  21  CREATININE 0.91 0.86  1.11*  --  1.34*  CALCIUM 9.6 9.0 9.1  --  8.7*  MG  --   --   --  1.7 1.8   GFR: Estimated Creatinine Clearance: 53.2 mL/min (A) (by C-G formula based on SCr of 1.34 mg/dL (H)). Liver Function Tests: No results for input(s): AST, ALT, ALKPHOS, BILITOT, PROT, ALBUMIN in the last 168 hours. No results for input(s): LIPASE, AMYLASE in the last 168 hours. No results for input(s): AMMONIA in the last 168 hours. Coagulation Profile: No results for input(s): INR, PROTIME in the last 168 hours. Cardiac Enzymes: No results for input(s): CKTOTAL, CKMB, CKMBINDEX, TROPONINI in the last 168 hours. BNP (last 3 results) Recent Labs    08/14/20 1609 08/29/20 1559 11/14/20 1335  PROBNP 7,991* 3,594* 10,511*   HbA1C: Recent Labs    02/12/21 0319  HGBA1C 9.8*   CBG: Recent Labs  Lab 02/13/21 0604 02/13/21 1113 02/13/21 1611 02/13/21 2201 02/14/21 0557  GLUCAP 112* 202* 193* 270* 145*   Lipid Profile: No results for input(s): CHOL, HDL, LDLCALC, TRIG, CHOLHDL, LDLDIRECT in the last 72 hours.  Thyroid Function Tests: No results for input(s): TSH, T4TOTAL, FREET4, T3FREE, THYROIDAB in the last 72 hours. Anemia Panel: No results for input(s): VITAMINB12, FOLATE, FERRITIN, TIBC, IRON, RETICCTPCT in the last 72 hours.    Radiology Studies: I have reviewed all of the imaging during this hospital visit personally     Scheduled Meds: . aspirin EC  81 mg Oral Daily  . busPIRone  5 mg Oral BID  . clopidogrel  75 mg Oral Daily  . dapagliflozin propanediol  10 mg Oral Daily  . diclofenac Sodium  2 g Topical QID  . FLUoxetine  10 mg Oral Daily  . gabapentin  100 mg Oral QHS  . insulin aspart  0-5 Units Subcutaneous QHS  . insulin aspart  0-9 Units Subcutaneous TID WC  . isosorbide dinitrate  30 mg Oral TID  . levothyroxine  50 mcg Oral QAC breakfast  . magnesium oxide  400 mg Oral TID  . methocarbamol  500 mg Oral TID AC & HS  . metoprolol succinate  50 mg Oral Daily  .  sacubitril-valsartan  1 tablet Oral BID  . spironolactone  25 mg Oral Daily   Continuous Infusions:   LOS: 2 days        Hannah Strader Gerome Apley, MD

## 2021-02-15 ENCOUNTER — Encounter (HOSPITAL_COMMUNITY): Payer: Self-pay | Admitting: Family Medicine

## 2021-02-15 ENCOUNTER — Other Ambulatory Visit: Payer: Self-pay | Admitting: Internal Medicine

## 2021-02-15 ENCOUNTER — Other Ambulatory Visit: Payer: Self-pay

## 2021-02-15 LAB — BASIC METABOLIC PANEL
Anion gap: 6 (ref 5–15)
BUN: 26 mg/dL — ABNORMAL HIGH (ref 8–23)
CO2: 33 mmol/L — ABNORMAL HIGH (ref 22–32)
Calcium: 8.6 mg/dL — ABNORMAL LOW (ref 8.9–10.3)
Chloride: 98 mmol/L (ref 98–111)
Creatinine, Ser: 1.21 mg/dL — ABNORMAL HIGH (ref 0.44–1.00)
GFR, Estimated: 46 mL/min — ABNORMAL LOW (ref >60)
Glucose, Bld: 183 mg/dL — ABNORMAL HIGH (ref 70–99)
Potassium: 4.1 mmol/L (ref 3.5–5.1)
Sodium: 137 mmol/L (ref 135–145)

## 2021-02-15 LAB — GLUCOSE, CAPILLARY
Glucose-Capillary: 164 mg/dL — ABNORMAL HIGH (ref 70–99)
Glucose-Capillary: 228 mg/dL — ABNORMAL HIGH (ref 70–99)

## 2021-02-15 MED ORDER — DAPAGLIFLOZIN PROPANEDIOL 10 MG PO TABS: 10.0000 mg | ORAL_TABLET | Freq: Every day | ORAL | 1 refills | Status: DC

## 2021-02-15 MED ORDER — FUROSEMIDE 40 MG PO TABS: 40.0000 mg | ORAL_TABLET | Freq: Every day | ORAL | 1 refills | Status: DC

## 2021-02-15 MED ORDER — ALENDRONATE SODIUM 70 MG PO TABS: 70.0000 mg | ORAL_TABLET | ORAL | 1 refills | Status: AC

## 2021-02-15 MED ORDER — DICLOFENAC SODIUM 1 % EX GEL: 2.0000 g | Freq: Four times a day (QID) | CUTANEOUS | 0 refills | Status: DC

## 2021-02-15 MED FILL — DICLOFENAC SODIUM 1 % GEL: 1 | 24 days supply | Qty: 300 | Fill #0

## 2021-02-15 MED FILL — FARXIGA 10 MG TABLET: 10 | 30 days supply | Qty: 30 | Fill #0

## 2021-02-15 MED FILL — FUROSEMIDE 40 MG TABLET: 40 | 30 days supply | Qty: 30 | Fill #0
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
## 2021-02-15 NOTE — Care Management Important Message (Signed)
Important Message  Patient Details  Name: Sara Graves MRN: [id number] Date of Birth: Mar 20, 1941   Medicare Important Message Given:  Yes - Important Message mailed due to current National Emergency  Verbal consent obtained due to current National Emergency  Relationship to patient: Self Contact Name: Berlene Dixson Call Date: 02/15/21  Time: 8280 Phone: [phone] Outcome: No Answer/Busy Important Message mailed to: Patient address on file     Delorse Lek 02/15/2021, 8:37 AM

## 2021-02-15 NOTE — Discharge Summary (Signed)
Physician Discharge Summary  [name] MRN: [id number] DOB/AGE: 1941/01/22 80 y.o.  Admit date: 02/11/2021 Discharge date: 02/15/2021  Admission Diagnoses:  Discharge Diagnoses:  Principal Problem:   Acute on chronic HFrEF (heart failure with reduced ejection fraction) (HCC) Active Problems:   Hypothyroidism   DM (diabetes mellitus), type 2, uncontrolled ([place])   Depression   Coronary artery disease involving native coronary artery of native heart without angina pectoris   ICD Medtronic [place] MRI CRTD 06/21/2019   Ischemic cardiomyopathy   Left upper extremity swelling   Dependence on continuous supplemental oxygen   CKD (chronic kidney disease), stage II   Discharged Condition: stable  Hospital Course: Patient is a 80 year old female with past medical history significant for type 2 diabetes mellitus, ischemic cardiomyopathy,coronary artery disease status post PCI, systolic heart failure, status post BiV-ICD, and hypothyroidism.  Patient was admitted with CHF exacerbation.  Presentation, patient endorsed worsening dyspnea on exertion, orthopnea, worsening edema and increased supplemental oxygen demand.  Patient was admitted and managed for CHF exacerbation.  1. 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  Since admission, patient is negative approximately 8 L. -Echocardiogram with reduced LV systolic function Ef 20 to 25% with global hypokinesis. Moderate reduction in RV systolic function, severe pulmonary hypertension, estimated RVSP 68 mmHg. -Cardiology team was consulted to assist with patient's management. -Patient symptoms have resolved significantly.  Patient is back to baseline. -Patient is eager to be discharged back home.    2.  CKD 2/CKD 3A with mild AKI:  -AKI is stable. -Primary care provider to follow renal function, electrolytes and  volume status on discharge.   3. CAD: -Stable.  No symptoms.   4.  Type 2 diabetes mellitus: -Continue to optimize. -Started on dapagliflozin,   5. Hypothyroid: -Continue levothyroxine  6. Depression.continue with buspirone and fluoxetine.   7. Left upper extremity edema. -Negative Doppler ultrasound studies for DVT.    8. Thrombocytopenia, stable platelet count in the 90's.   Consults: cardiology  Significant Diagnostic Studies:  Echo revealed: 1. Left ventricular ejection fraction, by estimation, is 20 to 25%. Left  ventricular ejection fraction by PLAX is 25 %. The left ventricle has  severely decreased function. The left ventricle demonstrates global  hypokinesis. The left ventricular internal  cavity size was moderately dilated. There is mild left ventricular  hypertrophy. Left ventricular diastolic function could not be evaluated.  2. Right ventricular systolic function is moderately reduced. The right  ventricular size is mildly enlarged. There is severely elevated pulmonary  artery systolic pressure. The estimated right ventricular systolic  pressure is 15.4 mmHg.  3. Left atrial size was severely dilated.  4. Right atrial size was moderately dilated.  5. Mitral regurgitation probably related to annular dilatation. The  mitral valve is normal in structure. Moderate to severe mitral valve  regurgitation.  6. Tricuspid valve regurgitation is moderate to severe.  7. The aortic valve is tricuspid. Aortic valve regurgitation is mild.  8. The inferior vena cava is dilated in size with <50% respiratory  variability, suggesting right atrial pressure of 15 mmHg.   Vascular ultrasound of the extremities: -Negative for DVT.  Discharge Exam: Blood pressure (!) 115/47, pulse (!) 53, temperature 98.1 F (36.7 C), temperature source Oral, resp. rate 18, height 5\' 2"  (1.575 m), weight 78.2 kg, SpO2 98 %.  Disposition: Discharge disposition: 01-Home or Self  Care   Discharge Instructions  Diet - low sodium heart healthy   Complete by: As directed    Increase activity slowly   Complete by: As directed      Allergies as of 02/15/2021      Reactions   Rosiglitazone Maleate Anaphylaxis, Swelling   Morphine Other (See Comments)   SEVERE HYPOTENSION   Cephalexin Diarrhea   Sulfa Antibiotics Diarrhea, Nausea And Vomiting   Tramadol Other (See Comments)   Unknown reaction   Elavil [amitriptyline] Nausea Only   Tramadol-acetaminophen Rash      Medication List    STOP taking these medications   ALLERGY 24-HR PO   clindamycin 150 MG capsule Commonly known as: CLEOCIN   diphenhydrAMINE 25 mg capsule Commonly known as: BENADRYL   hydrALAZINE 50 MG tablet Commonly known as: APRESOLINE   insulin glargine 100 UNIT/ML injection Commonly known as: LANTUS   loperamide 2 MG capsule Commonly known as: IMODIUM   ondansetron 4 MG tablet Commonly known as: ZOFRAN   torsemide 10 MG tablet Commonly known as: DEMADEX   UNABLE TO FIND     TAKE these medications   alendronate 70 MG tablet Commonly known as: FOSAMAX Take 1 tablet (70 mg total) by mouth once a week. What changed: when to take this   ALPRAZolam 0.25 MG tablet Commonly known as: XANAX Take 1 tablet (0.25 mg total) by mouth as directed. 1/2 tablet r twice daily or one tab at night for anxiety   aspirin EC 81 MG tablet Take 81 mg by mouth daily.   busPIRone 5 MG tablet Commonly known as: BUSPAR Take 5 mg by mouth 2 (two) times daily. As needed   CALCIUM 600 + D PO Take 1 tablet by mouth daily.   clopidogrel 75 MG tablet Commonly known as: PLAVIX Take 1 tablet (75 mg total) by mouth daily.   dapagliflozin propanediol 10 MG Tabs tablet Commonly known as: FARXIGA Take 1 tablet (10 mg total) by mouth daily. Start taking on: February 16, 2021   diclofenac Sodium 1 % Gel Commonly known as: VOLTAREN Apply 2 g topically 4 (four) times daily.   Entresto 97-103  MG Generic drug: sacubitril-valsartan TAKE 1 TABLET BY MOUTH TWICE A DAY   FLUoxetine 10 MG capsule Commonly known as: PROZAC Take 10 mg by mouth daily.   furosemide 40 MG tablet Commonly known as: LASIX Take 1 tablet (40 mg total) by mouth daily. Start taking on: February 16, 2021   gabapentin 100 MG capsule Commonly known as: NEURONTIN Take 100 mg by mouth at bedtime.   isosorbide dinitrate 30 MG tablet Commonly known as: ISORDIL Take 1 tablet (30 mg total) by mouth 3 (three) times daily.   levothyroxine 50 MCG tablet Commonly known as: SYNTHROID Take 50 mcg by mouth daily before breakfast.   Magnesium Oxide 500 MG Tabs TAKE 1 TABLET (500 MG TOTAL) BY MOUTH IN THE MORNING AND AT BEDTIME. What changed: See the new instructions.   methocarbamol 500 MG tablet Commonly known as: ROBAXIN Take 500 mg by mouth 4 (four) times daily.   metoprolol succinate 25 MG 24 hr tablet Commonly known as: TOPROL-XL Take 2 tablets (50 mg total) by mouth daily. Take with or immediately following a meal.   nitroGLYCERIN 0.4 MG SL tablet Commonly known as: NITROSTAT PLACE 1 TABLET UNDER THE TONGUE EVERY 5 MINUTES X 3 DOSES AS NEEDED FOR CHEST PAIN. What changed: See the new instructions.   glucose blood test strip 1 each 3 (three) times daily.   OneTouch  Verio test strip Generic drug: glucose blood 1 each by Other route 3 (three) times daily.   rosuvastatin 40 MG tablet Commonly known as: CRESTOR Take 1 tablet (40 mg total) by mouth at bedtime.   spironolactone 25 MG tablet Commonly known as: ALDACTONE Take 25 mg by mouth daily.   vitamin B-12 1000 MCG tablet Commonly known as: CYANOCOBALAMIN Take 1,000 mcg by mouth daily.   Vitamin D (Ergocalciferol) 1.25 MG (50000 UNIT) Caps capsule Commonly known as: DRISDOL Take 50,000 Units by mouth every 7 (seven) days. Take on Thursdays       Follow-up Information    Tolia, Sunit, DO Follow up on 02/22/2021.   Specialties: Cardiology,  Radiology, Vascular Surgery Why: 130pm  Contact information: [place] [address] [phone]               Signed: Bonnell Public 02/15/2021, 1:48 PM

## 2021-02-15 NOTE — Plan of Care (Signed)
Patient understanding of discharge instructions and home care

## 2021-02-15 NOTE — Progress Notes (Addendum)
Occupational Therapy Treatment Patient Details Name: Sara Graves MRN: [id number] DOB: January 20, 1941 Today's Date: 02/15/2021    History of present illness Pt is a 80 y.o. female who presented 3/28 with edema and SOB with acute on chronic systolic heart failue decomensations. PMH: uncontrolled diabetes mellitus, ischemic cardiomyopathy with severe LV systolic dysfunction, underlying left bundle branch block and SP Medtronic BiV ICD implantation on 06/21/2019, CAD s/p PCI and LAD stent, hypothyrodism, urinary incontinence, L rotator cuff tear, HTN.   OT comments  Pt making good progress towards OT goals this session, likely nearing baseline level of function. Pt likely to DC home today with her husband and no OT f/u. Session focus on education related to energy conservation strategies for home, pt able to state >2 ECS for home with pt already using a lot of strategies mentioned during session. Pt on 2L [place] during session with SpO2 > 95% ,pt additionally able to state appropriate O2 readings, recommended pulse ox for home for pt to monitor SpO2 independently. DC plan remains appropriate, will follow acutely per POC.    Follow Up Recommendations  No OT follow up;Supervision - Intermittent    Equipment Recommendations  None recommended by OT    Recommendations for Other Services      Precautions / Restrictions Precautions Precautions: Fall Precaution Comments: monitor SpO2, urinary incontinence Restrictions Weight Bearing Restrictions: No       Mobility Bed Mobility               General bed mobility comments: pt up in recliner and left in recliner at end of session    Transfers Overall transfer level: Needs assistance Equipment used: 4-wheeled walker Transfers: Sit to/from Stand Sit to Stand: Supervision         General transfer comment: pt declined out of chair transfer but reports safe mobility techniques for home such as using rollator during household distance mobility  and sitting as pt fatigues    Balance Overall balance assessment: Needs assistance   Sitting balance-Leahy Scale: Normal     Standing balance support: No upper extremity supported;Bilateral upper extremity supported Standing balance-Leahy Scale: Fair Standing balance comment: Bil UE to ambulate but can stand and perform ADLs without AD                           ADL either performed or assessed with clinical judgement   ADL Overall ADL's : Needs assistance/impaired                       Lower Body Dressing Details (indicate cue type and reason): education provided on using reacher at home for LB Dressing, pt reports having just ordered no lace slide on shoes with good backs   Toilet Transfer Details (indicate cue type and reason): pt declined toilet transfer or out of chair mobility       Tub/Shower Transfer Details (indicate cue type and reason): pt reports tub shower at home with seat where she sits on seat from outside of tub and lifts BLES over tub   General ADL Comments: session focus on education related to ECS for home with pt verbalizing understanding. pt able to recall >2 strategies that she already uses at home to save her energy.     Vision       Perception     Praxis      Cognition Arousal/Alertness: Awake/alert Behavior During Therapy: WFL for tasks assessed/performed Overall Cognitive  Status: Within Functional Limits for tasks assessed                                          Exercises Other Exercises Other Exercises: chin tucks with facilitation for correct form x 10   Shoulder Instructions       General Comments pt reports her husband assists her as needed at home and can also assist with iADLs although she likes to cook and take care of her dog "lucy." pt reports being well equipped with DME as pt has shower seat, reacher and rollator. issued pt energy conservation handout to increase carryover ECS for home, pt  very pleasant and appreciative of education    Pertinent Vitals/ Pain       Pain Assessment: No/denies pain Faces Pain Scale: Hurts little more Pain Location: neck Pain Descriptors / Indicators: Sore Pain Intervention(s): Limited activity within patient's tolerance;Monitored during session;Repositioned;Other (comment) (chin tucks)  Home Living                                          Prior Functioning/Environment              Frequency  Min 2X/week        Progress Toward Goals  OT Goals(current goals can now be found in the care plan section)  Progress towards OT goals: Progressing toward goals  Acute Rehab OT Goals Patient Stated Goal: to go home today and see her dog OT Goal Formulation: With patient Time For Goal Achievement: 02/27/21 Potential to Achieve Goals: Good  Plan Discharge plan remains appropriate;Frequency remains appropriate    Co-evaluation                 AM-PAC OT "6 Clicks" Daily Activity     Outcome Measure   Help from another person eating meals?: None Help from another person taking care of personal grooming?: None Help from another person toileting, which includes using toliet, bedpan, or urinal?: A Little Help from another person bathing (including washing, rinsing, drying)?: A Little Help from another person to put on and taking off regular upper body clothing?: None Help from another person to put on and taking off regular lower body clothing?: None 6 Click Score: 22    End of Session    OT Visit Diagnosis: Unsteadiness on feet (R26.81)   Activity Tolerance Patient tolerated treatment well   Patient Left in chair;with call bell/phone within reach;Other (comment) (MD entering at end of session)   Nurse Communication Mobility status        Time: 1638-4536 OT Time Calculation (min): 15 min  Charges: OT General Charges $OT Visit: 1 Visit OT Treatments $Self Care/Home Management : 8-22 mins  Harley Alto., COTA/L Acute Rehabilitation Services [phone]    Precious Haws 02/15/2021, 2:41 PM

## 2021-02-15 NOTE — Progress Notes (Signed)
Hoy Register to be D/C'd Home per MD order.  Discussed with the patient and all questions fully answered.   VSS, Skin clean, dry and intact without evidence of skin break down, no evidence of skin tears noted. IV catheter discontinued intact. Site without signs and symptoms of complications. Dressing and pressure applied.   An After Visit Summary was printed and given to the patient.    D/C education completed with patient/family including follow up instructions, medication list, d/c activities limitations if indicated, with other d/c instructions as indicated by MD - patient able to verbalize understanding, all questions fully answered.    Patient instructed to return to ED, call 911, or call MD for any changes in condition.    Patient escorted via [place], and D/C home via private care with husband.

## 2021-02-15 NOTE — Progress Notes (Signed)
Heart Failure Stewardship Pharmacist Progress Note   PCP: [name], PA-C PCP-Cardiologist: No primary care provider on file.    HPI:  80 yo F with PMH of T2DM, ICM, CAD, CHF, and hypothyroidism. She presented to the ED on 02/11/21 with edema and shortness of breath. An ECHO was done on 02/12/21 and LVEF was 20-25% (stable from last ECHO in Jan 2021).   Current HF Medications: Furosemide 40 mg PO daily Metoprolol XL 50 mg daily Entresto 97/103 mg BID Spironolactone 25 mg daily Farxiga 10 mg daily Isordil 30 mg TID  Prior to admission HF Medications: Torsemide 10 mg daily Metoprolol XL 50 mg daily Entresto 97/103 mg BID Spironolactone 25 mg daily Hydralazine 50 mg TID Isordil 30 mg TID  Pertinent Lab Values: . Serum creatinine 1.21, BUN 26, Potassium 4.1, Sodium 137, BNP 4088.9, Magnesium 1.8  Vital Signs: . Weight: 172 lbs (admission weight: 177 lbs) . Blood pressure: 110-130/50s  . Heart rate: 50s   Medication Assistance / Insurance Benefits Check: Does the patient have prescription insurance?  Yes Type of insurance plan: [employer]  Outpatient Pharmacy:  Prior to admission outpatient pharmacy: [facility] Is the patient willing to use [facility] at discharge? Yes Is the patient willing to transition their outpatient pharmacy to utilize a [facility] outpatient pharmacy?   Pending    Assessment: 1. Acute on chronic systolic CHF (EF 08-14%), due to ICM. NYHA class II symptoms. - Continue furosemide 40 mg PO daily - Continue metoprolol XL 50 mg daily - Continue Entresto 97/103 mg BID - Continue spironolactone 25 mg daily - Continue Farxiga 10 mg daily - PTA hydralazine on hold with lower BP - Continue Isordil 30 mg TID  Plan: 1) Medication changes recommended at this time: - Continue current regimen   2) Patient assistance: [place] copay $67 per month - Appreciate assistance from Willette Alma, [place], PharmD, BCPS Heart Failure  Stewardship Pharmacist Phone [phone]

## 2021-02-19 NOTE — Telephone Encounter (Signed)
Location of hospitalization: [facility] Reason for hospitalization: Trouble breathing, gained 4lbs over night Date of discharge: 02/15/2021 Date of first communication with patient: today Person contacting patient: Gaye Alken, CMA Current symptoms: noneDo you understand why you were in the Hospital: Yes Questions regarding discharge instructions: None Where were you discharged to: Home Medications reviewed: Yes Allergies reviewed: Yes Dietary changes reviewed: Yes. Discussed low fat and low salt diet.  Referals reviewed: NA Activities of Daily Living: Able to with mild limitations Any transportation issues/concerns: None Any patient concerns: Yes, has concerns she has lost 30lbs since the hopital Confirmed importance & date/time of Follow up appt: Yes Confirmed with patient if condition begins to worsen call. Pt was given the office number and encouraged to call back with questions or concerns: Yes

## 2021-02-20 ENCOUNTER — Encounter (HOSPITAL_COMMUNITY): Payer: Medicare PPO

## 2021-02-21 ENCOUNTER — Other Ambulatory Visit: Payer: Medicare PPO

## 2021-02-21 NOTE — Progress Notes (Signed)
Primary Physician:  [name], PA-C   [name], female    DOB: 16-Apr-1941, 80 y.o.   MRN: [id number]   Date: 02/22/21 Last Office Visit: 11/19/2020  Subjective:    Chief Complaint  Patient presents with  . Acute combined systolic and diastolic heart failure   . Hospitalization Follow-up    7 day    HPI: Sara Graves  is a 80 y.o. female  with hypertension, insulin-dependent diabetes mellitus type 2, CAD s/p prior LAD and ramus PCI, HFrEF, ischemic cardiomyopathy, s/p BiV ICD implantation 06/2019,  h/o Rt leg cellulitis and metatarsal fracture (managed by Podiatry) with chief complaint of " status post hospitalization for congestive heart failure."   Patient was recently hospitalized in March 2022 for acute exacerbation of congestive heart failure.  This hospitalization was most likely attributed to the loss of her son and subsequently indulging into high salt foods as a part of her coping mechanism.  Patient understands that this was the wrong approach to deal with stress and grief and states that she has gone better with her dietary habits since discharge.  During her recent hospitalization she diuresed and total net urine output of 8 L.  Her medications were titrated due to renal function as well.  Patient has been checking her daily weights, reduce salt intake and, and better with medication compliance.  She is also requiring less nasal cannula oxygen support.  She used to be on 3 L of oxygen around-the-clock and now at best uses 2 L.  Patient states that at times she is able to do house chores without the use of supplemental oxygen.  Overall remains euvolemic.  Review of her medications notes that she is on both [place].  Her recent coronary angiogram was on 01/27/19 and PCI to LAD  and balloon PTCA to IST RI lesion.  Does not complain of chest discomfort at rest or with effort related activities.  No recent use of sublingual nitroglycerin  tablets.   Past Medical History:  Diagnosis Date  . Acute combined systolic and diastolic heart failure ([place]) 04/25/2018  . Anxiety   . Arthritis    "hands" (12/29/2017)  . Basal cell carcinoma of nose    removed  . Bursitis of left shoulder   . BV ICD Med[employer]onic [place] MRI CRTD 06/21/2019  . Cat scratch fever    Late 90s  . Chronic combined systolic and diastolic CHF (congestive heart failure) ([place]) 06/08/2019  . Coronary artery disease   . Depression   . Diabetes mellitus, type II, insulin dependent ([place])   . Encounter for assessment of implantable cardioverter-defibrillator (ICD) 09/28/2019  . GERD (gastroesophageal reflux disease)   . H. pylori infection 2008 and 1998   treated  . Hyperlipidemia   . Hypertension   . Hypothyroidism   . Low oxygen saturation   . Multinodular goiter   . Rotator cuff tear, left recurrent   . Urge urinary incontinence   . UTI (lower urinary tract infection) 05/2016    Past Surgical History:  Procedure Laterality Date  . APPENDECTOMY  AGE 55  . BACK SURGERY    . BASAL CELL CARCINOMA EXCISION     "nose"  . BIV ICD INSERTION CRT-D N/A 06/21/2019   Procedure: BIV ICD INSERTION CRT-D;  Surgeon: Evans Lance, MD;  Location: [facility];  Service: Cardiovascular;  Laterality: N/A;  . BLADDER NECK SUSPENSION  1970's  . BUNIONECTOMY WITH HAMMERTOE RECONSTRUCTION Bilateral   .  CARPAL TUNNEL RELEASE Right 05/2017  . COLONOSCOPY W/ POLYPECTOMY    . CORONARY ANGIOPLASTY WITH STENT PLACEMENT  12/29/2017  . CORONARY BALLOON ANGIOPLASTY N/A 02/08/2019   Procedure: CORONARY BALLOON ANGIOPLASTY;  Surgeon: Adrian Prows, MD;  Location: [facility];  Service: Cardiovascular;  Laterality: N/A;  . CORONARY STENT INTERVENTION N/A 12/29/2017   Procedure: CORONARY STENT INTERVENTION;  Surgeon: Nigel Mormon, MD;  Location: [facility];  Service: Cardiovascular;  Laterality: N/A;  . CORONARY STENT INTERVENTION N/A 04/26/2018   Procedure:  CORONARY STENT INTERVENTION;  Surgeon: Nigel Mormon, MD;  Location: [facility];  Service: Cardiovascular;  Laterality: N/A;  . CORONARY STENT INTERVENTION N/A 02/08/2019   Procedure: CORONARY STENT INTERVENTION;  Surgeon: Adrian Prows, MD;  Location: [facility];  Service: Cardiovascular;  Laterality: N/A;  . ESOPHAGOGASTRODUODENOSCOPY ENDOSCOPY    . FORAMINAL DECOMPRESSION AT L2 TO THE SACRUM  01-05-2008   L2  -  S1  . GANGLION CYST EXCISION Left 01/17/2009   ring finger  . IMPLANTATION PERMANENT SPINAL CORD STIMULATOR  06-15-2008   JUNE 2013--  BATTERY CHANGE  . JOINT REPLACEMENT    . LEFT HEART CATH AND CORONARY ANGIOGRAPHY N/A 04/26/2018   Procedure: LEFT HEART CATH AND CORONARY ANGIOGRAPHY;  Surgeon: Nigel Mormon, MD;  Location: [facility];  Service: Cardiovascular;  Laterality: N/A;  . LEFT HEART CATH AND CORONARY ANGIOGRAPHY N/A 02/08/2019   Procedure: LEFT HEART CATH AND CORONARY ANGIOGRAPHY;  Surgeon: Adrian Prows, MD;  Location: [facility];  Service: Cardiovascular;  Laterality: N/A;  . LIPOMA EXCISION Right    RIGHT ELBOW  . METATARSAL HEAD EXCISION Right 06/16/2018   Procedure: right 5th metatarsal excision, a mini c-arm;  Surgeon: Trula Slade, DPM;  Location: [facility];  Service: Podiatry;  Laterality: Right;  anesthesia can do block  . RIGHT/LEFT HEART CATH AND CORONARY ANGIOGRAPHY N/A 12/29/2017   Procedure: RIGHT/LEFT HEART CATH AND CORONARY ANGIOGRAPHY;  Surgeon: Nigel Mormon, MD;  Location: [facility];  Service: Cardiovascular;  Laterality: N/A;  . SHOULDER OPEN ROTATOR CUFF REPAIR  09/07/2012   Procedure: ROTATOR CUFF REPAIR SHOULDER OPEN;  Surgeon: Magnus Sinning, MD;  Location: [facility];  Service: Orthopedics;  Laterality: Left;  OPEN ANTERIOR ACROMIONECTOMY AND ROTATOR CUFF REPAIR ON LEFT   . SHOULDER OPEN ROTATOR CUFF REPAIR Left 12/28/2012   Procedure: ROTATOR CUFF REPAIR SHOULDER OPEN;   Surgeon: Magnus Sinning, MD;  Location: [facility];  Service: Orthopedics;  Laterality: Left;  OPEN SHOULDER ROTATOR CUFF REPAIR ON LEFT  WITH ANTERIOR ACROMINECTOMY   . SHOULDER OPEN ROTATOR CUFF REPAIR Right 03/28/2003   RIGHT SHOULDER  DEGENERATIVE AC JOINT AND RC TEAR  . SPINAL CORD STIMULATOR BATTERY EXCHANGE N/A 07/03/2016   Procedure: SPINAL CORD STIMULATOR BATTERY PLACMENT;  Surgeon: Melina Schools, MD;  Location: [facility];  Service: Orthopedics;  Laterality: N/A;  . TONSILLECTOMY  AGE 32  . 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 VAGINAL HYSTERECTOMY  1970's   Social History   Tobacco Use  . Smoking status: Never Smoker  . Smokeless tobacco: Never Used  Vaping Use  . Vaping Use: Never used  Substance Use Topics  . Alcohol use: No  . Drug use: No    Review of Systems  Constitutional: Negative for chills, decreased appetite, malaise/fatigue and weight gain.  Cardiovascular: Positive for dyspnea on exertion (improving). Negative for  chest pain, leg swelling, orthopnea and syncope.  Respiratory: Negative for cough.   Endocrine: Negative for cold intolerance.  Hematologic/Lymphatic: Does not bruise/bleed easily.  Musculoskeletal: Negative for falls and joint swelling.  Gastrointestinal: Negative for abdominal pain, anorexia, change in bowel habit, diarrhea and nausea.  Neurological: Negative for headaches and light-headedness.  Psychiatric/Behavioral: Negative for depression and substance abuse.  All other systems reviewed and are negative.     Objective:   Vitals with BMI 02/22/2021 02/15/2021 02/15/2021  Height 5\' 2"  - -  Weight 166 lbs - 172 lbs 6 oz  BMI 95.09 - 32.67  Systolic 124 580 -  Diastolic 61 47 -  Pulse 61 53 -   Physical Exam Vitals reviewed.  Constitutional:      General: She is not in acute distress.    Appearance: She is well-developed.  HENT:     Head: Atraumatic.  Eyes:     Conjunctiva/sclera: Conjunctivae  normal.  Neck:     Thyroid: No thyromegaly.     Vascular: No JVD.  Cardiovascular:     Rate and Rhythm: Normal rate and regular rhythm.     Pulses: Intact distal pulses.          Carotid pulses are 2+ on the right side with bruit and 2+ on the left side.      Femoral pulses are 2+ on the right side and 2+ on the left side.      Popliteal pulses are 0 on the right side and 0 on the left side.       Dorsalis pedis pulses are 0 on the right side and 0 on the left side.       Posterior tibial pulses are 0 on the right side and 0 on the left side.     Heart sounds: Murmur heard.  No gallop.      Comments: Pacemaker/ICD site is clean dry and intact.  Soft holosystolic murmur heard at the apex. Pulmonary:     Effort: Pulmonary effort is normal. No respiratory distress.     Breath sounds: No stridor. No wheezing or rales.     Comments: On portable oxygen. Abdominal:     General: Bowel sounds are normal.     Palpations: Abdomen is soft.  Musculoskeletal:        General: Normal range of motion.     Cervical back: Neck supple.     Right lower leg: No edema.     Left lower leg: No swelling. No edema.  Skin:    General: Skin is warm and dry.  Neurological:     Mental Status: She is alert.    Radiology: No results found.  Laboratory examination:   CMP Latest Ref Rng & Units 02/15/2021 02/14/2021 02/13/2021  Glucose 70 - 99 mg/dL 183(H) 205(H) 123(H)  BUN 8 - 23 mg/dL 26(H) 21 15  Creatinine 0.44 - 1.00 mg/dL 1.21(H) 1.34(H) 1.11(H)  Sodium 135 - 145 mmol/L 137 139 140  Potassium 3.5 - 5.1 mmol/L 4.1 3.7 3.9  Chloride 98 - 111 mmol/L 98 97(L) 98  CO2 22 - 32 mmol/L 33(H) 35(H) 36(H)  Calcium 8.9 - 10.3 mg/dL 8.6(L) 8.7(L) 9.1  Total Protein 6.0 - 8.5 g/dL - - -  Total Bilirubin 0.0 - 1.2 mg/dL - - -  Alkaline Phos 39 - 117 IU/L - - -  AST 0 - 40 IU/L - - -  ALT 0 - 32 IU/L - - -   CBC Latest Ref Rng &  Units 02/12/2021 02/11/2021 08/14/2020  WBC 4.0 - 10.5 K/uL 4.1 4.4 -  Hemoglobin  12.0 - 15.0 g/dL 11.9(L) 13.1 11.9  Hematocrit 36.0 - 46.0 % 36.5 41.0 34.2  Platelets 150 - 400 K/uL 92(L) 94(L) -   Lipid Panel     Component Value Date/Time   CHOL 171 02/07/2020 1524   TRIG 62 02/07/2020 1524   HDL 55 02/07/2020 1524   CHOLHDL 2.9 12/26/2019 1407   CHOLHDL 3.9 03/25/2015 0325   VLDL 23 03/25/2015 0325   LDLCALC 104 (H) 02/07/2020 1524   LDLDIRECT 90 04/08/2012 1414   HEMOGLOBIN A1C Lab Results  Component Value Date   HGBA1C 9.8 (H) 02/12/2021   MPG 234.56 02/12/2021   TSH No results for input(s): TSH in the last 8760 hours. BNP (last 3 results) Recent Labs    02/11/21 2256 02/13/21 0218 02/14/21 0026  BNP 4,088.9* 2,726.8* 1,477.4*    ProBNP (last 3 results) Recent Labs    08/14/20 1609 08/29/20 1559 11/14/20 1335  PROBNP 7,991* 3,594* 10,511*   PRN Meds:. Medications Discontinued During This Encounter  Medication Reason  . furosemide (LASIX) 40 MG tablet Error  . diclofenac Sodium (VOLTAREN) 1 % GEL Error  . dapagliflozin propanediol (FARXIGA) 10 MG TABS tablet Error  . methocarbamol (ROBAXIN) 500 MG tablet Error  . clopidogrel (PLAVIX) 75 MG tablet Error  . empagliflozin (JARDIANCE) 10 MG TABS tablet Patient Preference   Current Meds  Medication Sig  . alendronate (FOSAMAX) 70 MG tablet Take 1 tablet (70 mg total) by mouth once a week.  . ALPRAZolam (XANAX) 0.25 MG tablet Take 1 tablet (0.25 mg total) by mouth as directed. 1/2 tablet r twice daily or one tab at night for anxiety  . aspirin EC 81 MG tablet Take 81 mg by mouth daily.  . busPIRone (BUSPAR) 5 MG tablet Take 5 mg by mouth 2 (two) times daily. As needed  . Calcium Carb-Cholecalciferol (CALCIUM 600 + D PO) Take 1 tablet by mouth daily.   . dapagliflozin propanediol (FARXIGA) 10 MG TABS tablet Take 1 tablet (10 mg total) by mouth daily.  . diclofenac Sodium (VOLTAREN) 1 % GEL APPLY 2 G TOPICALLY FOUR TIMES DAILY.  Marland Kitchen ENTRESTO 97-103 MG TAKE 1 TABLET BY MOUTH TWICE A DAY  .  ezetimibe (ZETIA) 10 MG tablet Take 10 mg by mouth daily.  Marland Kitchen FLUoxetine (PROZAC) 10 MG capsule Take 10 mg by mouth daily.  . furosemide (LASIX) 40 MG tablet TAKE 1 TABLET (40 MG TOTAL) BY MOUTH DAILY.  Marland Kitchen gabapentin (NEURONTIN) 100 MG capsule Take 100 mg by mouth at bedtime.  Marland Kitchen glucose blood test strip 1 each 3 (three) times daily.   . insulin aspart (NOVOLOG FLEXPEN) 100 UNIT/ML FlexPen Inject into the skin.  Marland Kitchen insulin degludec (TRESIBA) 100 UNIT/ML FlexTouch Pen Inject into the skin.  Marland Kitchen isosorbide dinitrate (ISORDIL) 30 MG tablet Take 1 tablet (30 mg total) by mouth 3 (three) times daily.  Marland Kitchen levothyroxine (SYNTHROID, LEVOTHROID) 50 MCG tablet Take 50 mcg by mouth daily before breakfast.   . Magnesium Oxide 500 MG TABS TAKE 1 TABLET (500 MG TOTAL) BY MOUTH IN THE MORNING AND AT BEDTIME. (Patient taking differently: Take 500 mg by mouth in the morning, at noon, and at bedtime.)  . metoprolol succinate (TOPROL-XL) 25 MG 24 hr tablet Take 2 tablets (50 mg total) by mouth daily. Take with or immediately following a meal.  . nitroGLYCERIN (NITROSTAT) 0.4 MG SL tablet PLACE 1 TABLET UNDER THE TONGUE  EVERY 5 MINUTES X 3 DOSES AS NEEDED FOR CHEST PAIN. (Patient taking differently: Place 0.4 mg under the tongue every 5 (five) minutes as needed for chest pain.)  . ONETOUCH VERIO test strip 1 each by Other route 3 (three) times daily.  . rosuvastatin (CRESTOR) 40 MG tablet Take 1 tablet (40 mg total) by mouth at bedtime.  Marland Kitchen spironolactone (ALDACTONE) 25 MG tablet Take 25 mg by mouth daily.  . vitamin B-12 (CYANOCOBALAMIN) 1000 MCG tablet Take 1,000 mcg by mouth daily.   . Vitamin D, Ergocalciferol, (DRISDOL) 50000 units CAPS capsule Take 50,000 Units by mouth every 7 (seven) days. Take on Thursdays  . [DISCONTINUED] empagliflozin (JARDIANCE) 10 MG TABS tablet Take 1 tablet by mouth daily.    Cardiac Studies:   EKG: 02/22/2021: NSR, 64bpm, LBBB, rare PVCs.  Echocardiogram: 02/12/2021: LVEF 20-25%,  severely reduced left ventricular systolic function, global hypokinesis, LV size moderately dilated, mild LVH, right ventricular systolic function moderately reduced, RV size mildly enlarged, PASP 68 mmHg, severe left atrial dilatation, moderately dilated right atrium, moderate to severe MR, moderate to severe TR, mild AR, IVC dilated estimated RAP 15 mmHg.  Stress test: None  Heart catheterization: Coronary angiogram 02/08/2019: Distal left main 20% diffuse disease. Ostial LAD stent shows diffuse 20% in-stent restenosis (3.0 x 15 mm resolute Onyx on 12/29/2017). Mid LAD and mid to distal LAD there are tandem lesions 80% and 95%. Ramus intermediate ostial 95 to 99% stenosis. Previously placed RI stent widely patent ( 2.0 x 12 mm resolute on 04/26/2018). 30 to 40% tandem lesions in the RCA. Cutting Balloon angioplasty of the ramus intermediate ostial high-grade stenosis, 99% reduced to 0% and Cutting Balloon angioplasty of the mid LAD followed by stenting with 2.5 x 32 mm Synergy DES for high-grade 90% stenosis reduced to 0% and TIMI-3 to TIMI-3 flow maintained in both lesions.  Carotid artery duplex 12/02/2018: No hemodynamically significant arterial disease in the internal carotid artery bilaterally. Minimal plaque noted bilateral carotid arteries. Antegrade right vertebral artery flow. Antegrade left vertebral artery flow.   Scheduled  In office ICD 11/20/19 Singl e (S)/Dual (D)/BV (M) M Presenting ASBP Pacer dependant: No. Underlying ASVS @75 /min. AP 4%, BP 95.% AMS Episodes 0.   HVR 0.  Longevity 9.4 Years/Voltage. Charge time Gap Inc. Lead measurements: Stable Histogram: Low (L)/normal (N)/high (H)  Normal Patient activity Low. Thoracic impedance: Does not suggest fluid overload. Has returned to base after recent increase Nov 3 rd week and back to baseline Jan 4th 2021.  Observations: Normal ICD function.  Changes: None.   Assessment:     ICD-10-CM   1. Chronic combined  systolic and diastolic heart failure, NYHA class 2 (HCC)  I50.42 EKG 06-TKZS    Basic metabolic panel    Pro b natriuretic peptide (BNP)    Magnesium  2. Ischemic cardiomyopathy  I25.5   3. Biventricular ICD (implantable cardioverter-defibrillator) in place  Z95.810   4. Coronary artery disease involving native coronary artery of native heart without angina pectoris  I25.10   5. Status post primary angioplasty with coronary stent  Z95.5   6. Mixed hyperlipidemia  E78.2   7. Left bundle branch block  I44.7   8. Essential hypertension  I10   9. On supplemental oxygen therapy  Z99.81   10. Type 2 diabetes mellitus with other circulatory complication, with long-term current use of insulin (HCC)  E11.59    Z79.4    Recommendations:  Acute combined systolic and diastolic heart failure, stage  C, NYHA class III:  Last heart failure hospitalization March 2022.  Patient's weight remains relatively stable since discharge.  Medications reconciled.  Patient was taking both [place] and [place].  Recommended to only take [place] for now.  We will recheck BMP, magnesium, and BNP and if within normal limits will increase the Entresto to 49/51 mg 2 tablets twice a day.  This is because the patient just refilled a 90-day supply of Entresto 49/51.  Patient understands that if we do want to increase the Entresto to 2 tablets twice a day she will need further work-up a week later to check her kidney function.  Patient is asked to keep a log of her weight on a daily basis.  If she notices weight gain of 1 pound over 24 hours or 3 pounds over the course of the week call the office.  Ischemic cardiomyopathy: See above  Biventricular ICD in situ:  Most recent interrogation reviewed. Continue to monitor  Established coronary artery disease with prior angioplasty and stents:  Continue aspirin and Plavix.  Patient does not endorse any evidence of bleeding.  No use of sublingual nitroglycerin tablets  since last visit.  Medications reconciled.  Insulin-dependent diabetes mellitus type 2: Currently the primary team. Most recent a1c reviewed.   Long-term insulin use: See above  Mixed hyperlipidemia:  Currently managed by primary care provider.  Recommend an LDL of 70 mg/dL or less.  Reeducated on the importance of lifestyle and dietary changes.  Currently on Crestor 40 mg p.o. nightly and Zetia at 10 mg p.o. daily  Current Outpatient Medications:  .  alendronate (FOSAMAX) 70 MG tablet, Take 1 tablet (70 mg total) by mouth once a week., Disp: 4 tablet, Rfl: 1 .  ALPRAZolam (XANAX) 0.25 MG tablet, Take 1 tablet (0.25 mg total) by mouth as directed. 1/2 tablet r twice daily or one tab at night for anxiety, Disp: 30 tablet, Rfl: 0 .  aspirin EC 81 MG tablet, Take 81 mg by mouth daily., Disp: , Rfl:  .  busPIRone (BUSPAR) 5 MG tablet, Take 5 mg by mouth 2 (two) times daily. As needed, Disp: , Rfl:  .  Calcium Carb-Cholecalciferol (CALCIUM 600 + D PO), Take 1 tablet by mouth daily. , Disp: , Rfl:  .  dapagliflozin propanediol (FARXIGA) 10 MG TABS tablet, Take 1 tablet (10 mg total) by mouth daily., Disp: 30 tablet, Rfl: 1 .  diclofenac Sodium (VOLTAREN) 1 % GEL, APPLY 2 G TOPICALLY FOUR TIMES DAILY., Disp: 300 g, Rfl: 0 .  ENTRESTO 97-103 MG, TAKE 1 TABLET BY MOUTH TWICE A DAY, Disp: 180 tablet, Rfl: 0 .  ezetimibe (ZETIA) 10 MG tablet, Take 10 mg by mouth daily., Disp: , Rfl:  .  FLUoxetine (PROZAC) 10 MG capsule, Take 10 mg by mouth daily., Disp: , Rfl:  .  furosemide (LASIX) 40 MG tablet, TAKE 1 TABLET (40 MG TOTAL) BY MOUTH DAILY., Disp: 30 tablet, Rfl: 1 .  gabapentin (NEURONTIN) 100 MG capsule, Take 100 mg by mouth at bedtime., Disp: , Rfl:  .  glucose blood test strip, 1 each 3 (three) times daily. , Disp: , Rfl:  .  insulin aspart (NOVOLOG FLEXPEN) 100 UNIT/ML FlexPen, Inject into the skin., Disp: , Rfl:  .  insulin degludec (TRESIBA) 100 UNIT/ML FlexTouch Pen, Inject into the  skin., Disp: , Rfl:  .  isosorbide dinitrate (ISORDIL) 30 MG tablet, Take 1 tablet (30 mg total) by mouth 3 (three) times daily., Disp: 90 tablet, Rfl: 2 .  levothyroxine (SYNTHROID, LEVOTHROID) 50 MCG tablet, Take 50 mcg by mouth daily before breakfast. , Disp: , Rfl:  .  Magnesium Oxide 500 MG TABS, TAKE 1 TABLET (500 MG TOTAL) BY MOUTH IN THE MORNING AND AT BEDTIME. (Patient taking differently: Take 500 mg by mouth in the morning, at noon, and at bedtime.), Disp: 180 tablet, Rfl: 1 .  metoprolol succinate (TOPROL-XL) 25 MG 24 hr tablet, Take 2 tablets (50 mg total) by mouth daily. Take with or immediately following a meal., Disp: 60 tablet, Rfl: 2 .  nitroGLYCERIN (NITROSTAT) 0.4 MG SL tablet, PLACE 1 TABLET UNDER THE TONGUE EVERY 5 MINUTES X 3 DOSES AS NEEDED FOR CHEST PAIN. (Patient taking differently: Place 0.4 mg under the tongue every 5 (five) minutes as needed for chest pain.), Disp: 25 tablet, Rfl: 2 .  ONETOUCH VERIO test strip, 1 each by Other route 3 (three) times daily., Disp: , Rfl: 5 .  rosuvastatin (CRESTOR) 40 MG tablet, Take 1 tablet (40 mg total) by mouth at bedtime., Disp: 90 tablet, Rfl: 3 .  spironolactone (ALDACTONE) 25 MG tablet, Take 25 mg by mouth daily., Disp: , Rfl:  .  vitamin B-12 (CYANOCOBALAMIN) 1000 MCG tablet, Take 1,000 mcg by mouth daily. , Disp: , Rfl:  .  Vitamin D, Ergocalciferol, (DRISDOL) 50000 units CAPS capsule, Take 50,000 Units by mouth every 7 (seven) days. Take on Thursdays, Disp: , Rfl:   Orders Placed This Encounter  Procedures  . Basic metabolic panel  . Pro b natriuretic peptide (BNP)  . Magnesium  . EKG 12-Lead   --Continue cardiac medications as reconciled in final medication list. --Return in about 3 months (around 05/24/2021) for Follow up, heart failure management.. Or sooner if needed. --Continue follow-up with your primary care physician regarding the management of your other chronic comorbid conditions.  Patient's questions and concerns  were addressed to her satisfaction. She voices understanding of the instructions provided during this encounter.   This note was created using a voice recognition software as a result there may be grammatical errors inadvertently enclosed that do not reflect the nature of this encounter. Every attempt is made to correct such errors.  Total time spent: 47 minutes.  Rex Kras, [place], [facility]  Pager: [phone] Office: [phone]

## 2021-02-22 ENCOUNTER — Encounter: Payer: Self-pay | Admitting: Cardiology

## 2021-02-22 ENCOUNTER — Ambulatory Visit: Payer: Medicare PPO | Admitting: Cardiology

## 2021-02-22 ENCOUNTER — Other Ambulatory Visit: Payer: Self-pay

## 2021-02-22 VITALS — BP 136/61 | HR 61 | Temp 98.0°F | Resp 16 | Ht 62.0 in | Wt 166.0 lb

## 2021-02-22 DIAGNOSIS — I5041 Acute combined systolic (congestive) and diastolic (congestive) heart failure: Secondary | ICD-10-CM

## 2021-02-22 DIAGNOSIS — E1159 Type 2 diabetes mellitus with other circulatory complications: Secondary | ICD-10-CM

## 2021-02-22 DIAGNOSIS — I5042 Chronic combined systolic (congestive) and diastolic (congestive) heart failure: Secondary | ICD-10-CM

## 2021-02-22 DIAGNOSIS — I2510 Atherosclerotic heart disease of native coronary artery without angina pectoris: Secondary | ICD-10-CM

## 2021-02-22 DIAGNOSIS — Z95810 Presence of automatic (implantable) cardiac defibrillator: Secondary | ICD-10-CM

## 2021-02-22 DIAGNOSIS — E782 Mixed hyperlipidemia: Secondary | ICD-10-CM

## 2021-02-22 DIAGNOSIS — I255 Ischemic cardiomyopathy: Secondary | ICD-10-CM

## 2021-02-22 DIAGNOSIS — I10 Essential (primary) hypertension: Secondary | ICD-10-CM

## 2021-02-22 DIAGNOSIS — Z9981 Dependence on supplemental oxygen: Secondary | ICD-10-CM

## 2021-02-22 DIAGNOSIS — Z955 Presence of coronary angioplasty implant and graft: Secondary | ICD-10-CM

## 2021-02-22 DIAGNOSIS — Z794 Long term (current) use of insulin: Secondary | ICD-10-CM

## 2021-02-22 DIAGNOSIS — I447 Left bundle-branch block, unspecified: Secondary | ICD-10-CM

## 2021-02-23 LAB — BASIC METABOLIC PANEL
BUN/Creatinine Ratio: 30 — ABNORMAL HIGH (ref 12–28)
BUN: 37 mg/dL — ABNORMAL HIGH (ref 8–27)
CO2: 23 mmol/L (ref 20–29)
Calcium: 9.6 mg/dL (ref 8.7–10.3)
Chloride: 103 mmol/L (ref 96–106)
Creatinine, Ser: 1.23 mg/dL — ABNORMAL HIGH (ref 0.57–1.00)
Glucose: 154 mg/dL — ABNORMAL HIGH (ref 65–99)
Potassium: 4.7 mmol/L (ref 3.5–5.2)
Sodium: 144 mmol/L (ref 134–144)
eGFR: 45 mL/min/{1.73_m2} — ABNORMAL LOW (ref >59)

## 2021-02-23 LAB — MAGNESIUM: Magnesium: 2.2 mg/dL (ref 1.6–2.3)

## 2021-02-23 LAB — PRO B NATRIURETIC PEPTIDE: NT-Pro BNP: 5475 pg/mL — ABNORMAL HIGH (ref 0–738)

## 2021-03-02 ENCOUNTER — Other Ambulatory Visit: Payer: Self-pay | Admitting: Cardiology

## 2021-03-02 DIAGNOSIS — I5042 Chronic combined systolic (congestive) and diastolic (congestive) heart failure: Secondary | ICD-10-CM

## 2021-03-04 NOTE — Progress Notes (Signed)
Called and spoke to pt regarding labs and medication change. Pt voiced understanding.

## 2021-03-05 ENCOUNTER — Other Ambulatory Visit: Payer: Self-pay

## 2021-03-05 ENCOUNTER — Ambulatory Visit: Payer: Medicare PPO | Admitting: Cardiology

## 2021-03-13 NOTE — Progress Notes (Signed)
Spoke to patient she is aware to take Entresto 97/103 1 tablet BID

## 2021-03-25 ENCOUNTER — Other Ambulatory Visit: Payer: Self-pay

## 2021-03-25 MED ORDER — FUROSEMIDE 40 MG PO TABS
ORAL_TABLET | Freq: Every day | ORAL | 1 refills | Status: DC
Start: 2021-03-25 — End: 2021-08-27

## 2021-03-26 ENCOUNTER — Other Ambulatory Visit: Payer: Self-pay | Admitting: Pharmacist

## 2021-03-26 NOTE — Telephone Encounter (Signed)
Reviewed recent PCP lab results with pt. Renal function noted to be mildly elevated. Pt reports to be tolerating entresto 97/103 mg BID without significant ADRs. Continues to monitor her BP and weights daily. Using lasix 40 mg daily currently. Reviewed with Dr. Terri Skains. Recommended pt stop daily lasix and to use lasix PRN moving forward. Pt denies any current complains of edema, dyspnea, SOB. Pt verbalized that if she notices wt gain >3 lbs/day or >5 lbs/week and she notices lower extremity edema and/or SOB/DOE, she will take her lasix for 1-2 days to assess for improvement. Completed Entresto PAP application and will follow up with pt about application outcome.

## 2021-04-27 ENCOUNTER — Other Ambulatory Visit: Payer: Self-pay | Admitting: Cardiology

## 2021-04-27 DIAGNOSIS — I255 Ischemic cardiomyopathy: Secondary | ICD-10-CM

## 2021-04-30 ENCOUNTER — Telehealth: Payer: Self-pay | Admitting: Cardiology

## 2021-04-30 NOTE — Telephone Encounter (Signed)
Unscheduled alert from patient transmission 04/29/2021: No significant abnormality, no significant arrhythmias, no therapy.  Thoracic impedance within normal limits.  All looks well. Do not know reason for transmission.  JG

## 2021-05-13 ENCOUNTER — Ambulatory Visit (INDEPENDENT_AMBULATORY_CARE_PROVIDER_SITE_OTHER): Payer: Medicare PPO | Admitting: Ophthalmology

## 2021-05-13 ENCOUNTER — Encounter (INDEPENDENT_AMBULATORY_CARE_PROVIDER_SITE_OTHER): Payer: Self-pay | Admitting: Ophthalmology

## 2021-05-13 ENCOUNTER — Other Ambulatory Visit: Payer: Self-pay

## 2021-05-13 DIAGNOSIS — E113413 Type 2 diabetes mellitus with severe nonproliferative diabetic retinopathy with macular edema, bilateral: Secondary | ICD-10-CM | POA: Diagnosis not present

## 2021-05-13 DIAGNOSIS — H2513 Age-related nuclear cataract, bilateral: Secondary | ICD-10-CM | POA: Insufficient documentation

## 2021-05-13 DIAGNOSIS — E113411 Type 2 diabetes mellitus with severe nonproliferative diabetic retinopathy with macular edema, right eye: Secondary | ICD-10-CM | POA: Diagnosis not present

## 2021-05-13 DIAGNOSIS — H43823 Vitreomacular adhesion, bilateral: Secondary | ICD-10-CM

## 2021-05-13 DIAGNOSIS — E113412 Type 2 diabetes mellitus with severe nonproliferative diabetic retinopathy with macular edema, left eye: Secondary | ICD-10-CM | POA: Insufficient documentation

## 2021-05-13 DIAGNOSIS — E113511 Type 2 diabetes mellitus with proliferative diabetic retinopathy with macular edema, right eye: Secondary | ICD-10-CM | POA: Insufficient documentation

## 2021-05-13 HISTORY — DX: Age-related nuclear cataract, bilateral: H25.13

## 2021-05-13 MED ORDER — BEVACIZUMAB 2.5 MG/0.1ML IZ SOSY
2.5000 mg | PREFILLED_SYRINGE | INTRAVITREAL | Status: AC | PRN
  Administered 2021-05-13: 2.5 mg via INTRAVITREAL

## 2021-05-13 NOTE — Assessment & Plan Note (Signed)
Commence with therapy of CSME OD today to inject intravitreal Avastin today will also halt progression of diabetic retinopathy

## 2021-05-13 NOTE — Assessment & Plan Note (Signed)
Dense severe brunescent cataract OU likely accounts for acuity and also with nighttime visual symptoms will need cataract surgery with intraocular lens placement under the direction of Dr. Delton Prairie in the near future once diabetic maculopathy controlled

## 2021-05-13 NOTE — Assessment & Plan Note (Signed)
Observe

## 2021-05-13 NOTE — Telephone Encounter (Signed)
Spoke with son, he thinks that one of the grandkids may have done that.

## 2021-05-13 NOTE — Assessment & Plan Note (Signed)
We will commence with we will commence with therapy left eye soon

## 2021-05-13 NOTE — Progress Notes (Signed)
05/13/2021     CHIEF COMPLAINT Patient presents for Retina Evaluation (NP- DME OD- Referred by Dr. Laurin Coder states, "I am getting ready to have cataract surgery and Dr. Katy Fitch said my right eye needs to be checked because my diabetes is effecting my va. I need this checked before I can have surgery."/A1C:8.2/LBS: 084)   HISTORY OF PRESENT ILLNESS: Sara Graves is a 80 y.o. female who presents to the clinic today for:   HPI     Retina Evaluation           Laterality: both eyes   Onset: 1 month ago   Duration: 1 month   Associated Symptoms: Distortion (My va seems wavy and blurry at times) and Glare   Context: distance vision, mid-range vision, watching TV and computer work   Treatments tried: no treatments   Comments: NP- DME OD- Referred by Dr. Zenia Resides Pt states, "I am getting ready to have cataract surgery and Dr. Katy Fitch said my right eye needs to be checked because my diabetes is effecting my va. I need this checked before I can have surgery." A1C:8.2 LBS: 084       Last edited by Kendra Opitz, COA on 05/13/2021  8:50 AM.      Referring physician: [name], PA-C [place],  [place] [id number]  HISTORICAL INFORMATION:   Selected notes from the MEDICAL RECORD NUMBER    Lab Results  Component Value Date   HGBA1C 9.8 (H) 02/12/2021     CURRENT MEDICATIONS: No current outpatient medications on file. (Ophthalmic Drugs)   No current facility-administered medications for this visit. (Ophthalmic Drugs)   Current Outpatient Medications (Other)  Medication Sig   alendronate (FOSAMAX) 70 MG tablet Take 1 tablet (70 mg total) by mouth once a week.   ALPRAZolam (XANAX) 0.25 MG tablet Take 1 tablet (0.25 mg total) by mouth as directed. 1/2 tablet r twice daily or one tab at night for anxiety   aspirin EC 81 MG tablet Take 81 mg by mouth daily.   busPIRone (BUSPAR) 5 MG tablet Take 5 mg by mouth 2 (two) times daily. As needed   Calcium  Carb-Cholecalciferol (CALCIUM 600 + D PO) Take 1 tablet by mouth daily.    dapagliflozin propanediol (FARXIGA) 10 MG TABS tablet Take 1 tablet (10 mg total) by mouth daily.   diclofenac Sodium (VOLTAREN) 1 % GEL APPLY 2 G TOPICALLY FOUR TIMES DAILY.   ezetimibe (ZETIA) 10 MG tablet Take 10 mg by mouth daily.   FLUoxetine (PROZAC) 10 MG capsule Take 10 mg by mouth daily.   furosemide (LASIX) 40 MG tablet TAKE 1 TABLET (40 MG TOTAL) BY MOUTH DAILY. (Patient taking differently: Take by mouth daily as needed for fluid or edema.)   gabapentin (NEURONTIN) 100 MG capsule Take 100 mg by mouth at bedtime.   glucose blood test strip 1 each 3 (three) times daily.    insulin aspart (NOVOLOG FLEXPEN) 100 UNIT/ML FlexPen Inject into the skin.   insulin degludec (TRESIBA) 100 UNIT/ML FlexTouch Pen Inject into the skin.   isosorbide dinitrate (ISORDIL) 30 MG tablet Take 1 tablet (30 mg total) by mouth 3 (three) times daily.   levothyroxine (SYNTHROID, LEVOTHROID) 50 MCG tablet Take 50 mcg by mouth daily before breakfast.    Magnesium Oxide 500 MG TABS TAKE 1 TABLET (500 MG TOTAL) BY MOUTH IN THE MORNING AND AT BEDTIME. (Patient taking differently: Take 500 mg by mouth in the morning,  at noon, and at bedtime.)   metoprolol succinate (TOPROL-XL) 25 MG 24 hr tablet TAKE 2 TABLETS (50 MG TOTAL) BY MOUTH DAILY. TAKE WITH OR IMMEDIATELY FOLLOWING A MEAL.   nitroGLYCERIN (NITROSTAT) 0.4 MG SL tablet PLACE 1 TABLET UNDER THE TONGUE EVERY 5 MINUTES X 3 DOSES AS NEEDED FOR CHEST PAIN. (Patient taking differently: Place 0.4 mg under the tongue every 5 (five) minutes as needed for chest pain.)   ONETOUCH VERIO test strip 1 each by Other route 3 (three) times daily.   rosuvastatin (CRESTOR) 40 MG tablet Take 1 tablet (40 mg total) by mouth at bedtime.   sacubitril-valsartan (ENTRESTO) 97-103 MG Take 1 tablet by mouth 2 (two) times daily.   spironolactone (ALDACTONE) 25 MG tablet Take 25 mg by mouth daily.   vitamin B-12  (CYANOCOBALAMIN) 1000 MCG tablet Take 1,000 mcg by mouth daily.    Vitamin D, Ergocalciferol, (DRISDOL) 50000 units CAPS capsule Take 50,000 Units by mouth every 7 (seven) days. Take on Thursdays   No current facility-administered medications for this visit. (Other)      REVIEW OF SYSTEMS:    ALLERGIES Allergies  Allergen Reactions   Rosiglitazone Maleate Anaphylaxis and Swelling   Morphine Other (See Comments)    SEVERE HYPOTENSION    Cephalexin Diarrhea   Sulfa Antibiotics Diarrhea and Nausea And Vomiting   Tramadol Other (See Comments)    Unknown reaction   Elavil [Amitriptyline] Nausea Only   Tramadol-Acetaminophen Rash    PAST MEDICAL HISTORY Past Medical History:  Diagnosis Date   Acute combined systolic and diastolic heart failure ([place]) 04/25/2018   Anxiety    Arthritis    "hands" (12/29/2017)   Basal cell carcinoma of nose    removed   Bursitis of left shoulder    BV ICD Med[employer]onic [place] MRI CRTD 06/21/2019   Cat scratch fever    Late 90s   Chronic combined systolic and diastolic CHF (congestive heart failure) ([place]) 06/08/2019   Coronary artery disease    Depression    Diabetes mellitus, type II, insulin dependent ([place])    Encounter for assessment of implantable cardioverter-defibrillator (ICD) 09/28/2019   GERD (gastroesophageal reflux disease)    H. pylori infection 2008 and 1998   treated   Hyperlipidemia    Hypertension    Hypothyroidism    Low oxygen saturation    Multinodular goiter    Rotator cuff tear, left recurrent    Urge urinary incontinence    UTI (lower urinary tract infection) 05/2016   Past Surgical History:  Procedure Laterality Date   APPENDECTOMY  AGE 45   BACK SURGERY     BASAL CELL CARCINOMA EXCISION     "nose"   BIV ICD INSERTION CRT-D N/A 06/21/2019   Procedure: BIV ICD INSERTION CRT-D;  Surgeon: Evans Lance, MD;  Location: [facility];  Service: Cardiovascular;  Laterality: N/A;   BLADDER NECK SUSPENSION  1970's    BUNIONECTOMY WITH HAMMERTOE RECONSTRUCTION Bilateral    CARPAL TUNNEL RELEASE Right 05/2017   COLONOSCOPY W/ POLYPECTOMY     CORONARY ANGIOPLASTY WITH STENT PLACEMENT  12/29/2017   CORONARY BALLOON ANGIOPLASTY N/A 02/08/2019   Procedure: CORONARY BALLOON ANGIOPLASTY;  Surgeon: Adrian Prows, MD;  Location: [facility];  Service: Cardiovascular;  Laterality: N/A;   CORONARY STENT INTERVENTION N/A 12/29/2017   Procedure: CORONARY STENT INTERVENTION;  Surgeon: Nigel Mormon, MD;  Location: [facility];  Service: Cardiovascular;  Laterality: N/A;   CORONARY STENT INTERVENTION N/A  04/26/2018   Procedure: CORONARY STENT INTERVENTION;  Surgeon: Nigel Mormon, MD;  Location: [facility];  Service: Cardiovascular;  Laterality: N/A;   CORONARY STENT INTERVENTION N/A 02/08/2019   Procedure: CORONARY STENT INTERVENTION;  Surgeon: Adrian Prows, MD;  Location: [facility];  Service: Cardiovascular;  Laterality: N/A;   ESOPHAGOGASTRODUODENOSCOPY ENDOSCOPY     FORAMINAL DECOMPRESSION AT L2 TO THE SACRUM  01-05-2008   L2  -  S1   GANGLION CYST EXCISION Left 01/17/2009   ring finger   IMPLANTATION PERMANENT SPINAL CORD STIMULATOR  06-15-2008   JUNE 2013--  BATTERY CHANGE   JOINT REPLACEMENT     LEFT HEART CATH AND CORONARY ANGIOGRAPHY N/A 04/26/2018   Procedure: LEFT HEART CATH AND CORONARY ANGIOGRAPHY;  Surgeon: Nigel Mormon, MD;  Location: [facility];  Service: Cardiovascular;  Laterality: N/A;   LEFT HEART CATH AND CORONARY ANGIOGRAPHY N/A 02/08/2019   Procedure: LEFT HEART CATH AND CORONARY ANGIOGRAPHY;  Surgeon: Adrian Prows, MD;  Location: [facility];  Service: Cardiovascular;  Laterality: N/A;   LIPOMA EXCISION Right    RIGHT ELBOW   METATARSAL HEAD EXCISION Right 06/16/2018   Procedure: right 5th metatarsal excision, a mini c-arm;  Surgeon: Trula Slade, DPM;  Location: [facility];  Service: Podiatry;  Laterality: Right;  anesthesia can do block    RIGHT/LEFT HEART CATH AND CORONARY ANGIOGRAPHY N/A 12/29/2017   Procedure: RIGHT/LEFT HEART CATH AND CORONARY ANGIOGRAPHY;  Surgeon: Nigel Mormon, MD;  Location: [facility];  Service: Cardiovascular;  Laterality: N/A;   SHOULDER OPEN ROTATOR CUFF REPAIR  09/07/2012   Procedure: ROTATOR CUFF REPAIR SHOULDER OPEN;  Surgeon: Magnus Sinning, MD;  Location: [facility];  Service: Orthopedics;  Laterality: Left;  OPEN ANTERIOR ACROMIONECTOMY AND ROTATOR CUFF REPAIR ON LEFT    SHOULDER OPEN ROTATOR CUFF REPAIR Left 12/28/2012   Procedure: ROTATOR CUFF REPAIR SHOULDER OPEN;  Surgeon: Magnus Sinning, MD;  Location: [facility];  Service: Orthopedics;  Laterality: Left;  OPEN SHOULDER ROTATOR CUFF REPAIR ON LEFT  WITH ANTERIOR ACROMINECTOMY    SHOULDER OPEN ROTATOR CUFF REPAIR Right 03/28/2003   RIGHT SHOULDER  DEGENERATIVE AC JOINT AND RC TEAR   SPINAL CORD STIMULATOR BATTERY EXCHANGE N/A 07/03/2016   Procedure: SPINAL CORD STIMULATOR BATTERY PLACMENT;  Surgeon: Melina Schools, MD;  Location: [facility];  Service: Orthopedics;  Laterality: N/A;   TONSILLECTOMY  AGE 81   TOTAL KNEE ARTHROPLASTY Right 05/06/2000   OA RIGHT KNEE   VAGINAL HYSTERECTOMY  1970's    FAMILY HISTORY Family History  Problem Relation Age of Onset   Cancer Mother        breast   Heart Problems Mother    Prostate cancer Father    Muscular dystrophy Son    Lung cancer Son    Cancer Maternal Grandmother        breast   Heart disease Maternal Grandfather    Breast cancer Sister     SOCIAL HISTORY Social History   Tobacco Use   Smoking status: Never   Smokeless tobacco: Never  Vaping Use   Vaping Use: Never used  Substance Use Topics   Alcohol use: No   Drug use: No         OPHTHALMIC EXAM:  Base Eye Exam     Visual Acuity (ETDRS)       Right Left   Dist cc 20/100 -2 20/40 -2   Dist  ph cc NI NI    Correction: Glasses         Tonometry (Tonopen, 8:54  AM)       Right Left   Pressure 14 13         Pupils       Pupils Dark Light Shape React APD   Right PERRL 4 3 Round Brisk None   Left PERRL 4 3 Round Brisk None         Visual Fields (Counting fingers)       Left Right    Full Full         Extraocular Movement       Right Left    Full Full         Neuro/Psych     Oriented x3: Yes         Dilation     Both eyes: 1.0% Mydriacyl, 2.5% Phenylephrine @ 8:54 AM           Slit Lamp and Fundus Exam     External Exam       Right Left   External Normal Normal         Slit Lamp Exam       Right Left   Lids/Lashes Normal Normal   Conjunctiva/Sclera White and quiet White and quiet   Cornea Clear Clear   Anterior Chamber Deep and quiet Deep and quiet   Iris Round and reactive Round and reactive   Lens 3+ Nuclear sclerosis 3+ Nuclear sclerosis   Anterior Vitreous Normal Normal         Fundus Exam       Right Left   Posterior Vitreous Normal Normal   Disc Normal Normal   C/D Ratio 0.3 0.3   Macula Microaneurysms, Macular thickening, Moderate clinically significant macular edema, center involved Microaneurysms, Macular thickening, Moderate clinically significant macular edema, superotemporal to FAZ   Vessels NPDR-Severe NPDR-Severe   Periphery Normal Normal            IMAGING AND PROCEDURES  Imaging and Procedures for 05/13/21  OCT, Retina - OU - Both Eyes       Right Eye Quality was good. Scan locations included subfoveal. Central Foveal Thickness: 394. Progression has no prior data. Findings include abnormal foveal contour, cystoid macular edema, vitreomacular adhesion .   Left Eye Quality was good. Scan locations included subfoveal. Central Foveal Thickness: 320. Progression has no prior data. Findings include abnormal foveal contour, cystoid macular edema, vitreomacular adhesion .   Notes CSME OU, with center involvement OD       Intravitreal Injection, Pharmacologic  Agent - OD - Right Eye       Time Out 05/13/2021. 9:38 AM. Confirmed correct patient, procedure, site, and patient consented.   Anesthesia Topical anesthesia was used. Anesthetic medications included Akten 3.5%.   Procedure Preparation included Tobramycin 0.3%, 10% betadine to eyelids, 5% betadine to ocular surface. A 30 gauge needle was used.   Injection: 2.5 mg bevacizumab 2.5 MG/0.1ML   Route: Intravitreal, Site: Right Eye   NDC: [id number], Lot: [id number]   Post-op Post injection exam found visual acuity of at least counting fingers. The patient tolerated the procedure well. There were no complications. The patient received written and verbal post procedure care education. Post injection medications were not given.              ASSESSMENT/PLAN:  Vitreomacular adhesion of both eyes Observe  Cataract, nuclear sclerotic, both eyes Dense severe brunescent cataract OU  likely accounts for acuity and also with nighttime visual symptoms will need cataract surgery with intraocular lens placement under the direction of Dr. Delton Prairie in the near future once diabetic maculopathy controlled  Severe nonproliferative diabetic retinopathy of right eye, with macular edema, associated with type 2 diabetes mellitus ([place]) Commence with therapy of CSME OD today to inject intravitreal Avastin today will also halt progression of diabetic retinopathy  Severe nonproliferative diabetic retinopathy of left eye, with macular edema, associated with type 2 diabetes mellitus ([place]) We will commence with we will commence with therapy left eye soon     ICD-10-CM   1. Severe nonproliferative diabetic retinopathy of right eye, with macular edema, associated with type 2 diabetes mellitus (H[facility])  E11.3411 OCT, Retina - OU - Both Eyes    Intravitreal Injection, Pharmacologic Agent - OD - Right Eye    bevacizumab (AVASTIN) SOSY 2.5 mg    2. Severe nonproliferative diabetic retinopathy of left eye, with  macular edema, associated with type 2 diabetes mellitus (H[facility])  H08.6578 OCT, Retina - OU - Both Eyes    3. Vitreomacular adhesion of both eyes  H43.823     4. Cataract, nuclear sclerotic, both eyes  H25.13       1.  We will commence with therapy today OD for center involved CSME so that patient can experience and understand the procedure and develop confidence  2.  Follow-up within 1 week left eye for injection intravitreal Avastin OS.  3.  Patient may return to Dr. Delton Prairie at any time and begin the process of scheduling cataract surgery with intraocular lens placement even during the reception of these medications as the medications will still control her condition and improve the swelling and make it safe enough to have and enjoy the cataract surgery results  Ophthalmic Meds Ordered this visit:  Meds ordered this encounter  Medications   bevacizumab (AVASTIN) SOSY 2.5 mg       Return in about 1 week (around 05/20/2021) for dilate, OS, AVASTIN OCT.  There are no Patient Instructions on file for this visit.   Explained the diagnoses, plan, and follow up with the patient and they expressed understanding.  Patient expressed understanding of the importance of proper follow up care.   Clent Demark Shiri Hodapp M.D. Diseases & Surgery of the Retina and Vitreous [facility] Diabetic [place] 05/13/21     Abbreviations: M myopia (nearsighted); A astigmatism; H hyperopia (farsighted); P presbyopia; Mrx spectacle prescription;  CTL contact lenses; OD right eye; OS left eye; OU both eyes  XT exotropia; ET esotropia; PEK punctate epithelial keratitis; PEE punctate epithelial erosions; DES dry eye syndrome; MGD meibomian gland dysfunction; ATs artificial tears; PFAT's preservative free artificial tears; [place] nuclear sclerotic cataract; PSC posterior subcapsular cataract; ERM epi-retinal membrane; PVD posterior vitreous detachment; RD retinal detachment; DM diabetes mellitus; DR diabetic retinopathy;  NPDR non-proliferative diabetic retinopathy; PDR proliferative diabetic retinopathy; CSME clinically significant macular edema; DME diabetic macular edema; dbh dot blot hemorrhages; CWS cotton wool spot; POAG primary open angle glaucoma; C/D cup-to-disc ratio; HVF humphrey visual field; GVF goldmann visual field; OCT optical coherence tomography; IOP intraocular pressure; BRVO Branch retinal vein occlusion; CRVO central retinal vein occlusion; CRAO central retinal artery occlusion; BRAO branch retinal artery occlusion; RT retinal tear; SB scleral buckle; PPV pars plana vitrectomy; VH Vitreous hemorrhage; PRP panretinal laser photocoagulation; IVK intravitreal kenalog; VMT vitreomacular traction; MH Macular hole;  NVD neovascularization of the disc; NVE neovascularization elsewhere; AREDS age related eye disease  study; ARMD age related macular degeneration; POAG primary open angle glaucoma; EBMD epithelial/anterior basement membrane dystrophy; ACIOL anterior chamber intraocular lens; IOL intraocular lens; PCIOL posterior chamber intraocular lens; Phaco/IOL phacoemulsification with intraocular lens placement; [place] photorefractive keratectomy; LASIK laser assisted in situ keratomileusis; HTN hypertension; DM diabetes mellitus; COPD chronic obstructive pulmonary disease

## 2021-05-17 ENCOUNTER — Other Ambulatory Visit: Payer: Self-pay | Admitting: Cardiology

## 2021-05-19 ENCOUNTER — Other Ambulatory Visit: Payer: Self-pay | Admitting: Cardiology

## 2021-05-19 DIAGNOSIS — I5041 Acute combined systolic (congestive) and diastolic (congestive) heart failure: Secondary | ICD-10-CM

## 2021-05-21 ENCOUNTER — Ambulatory Visit (INDEPENDENT_AMBULATORY_CARE_PROVIDER_SITE_OTHER): Payer: Medicare PPO | Admitting: Ophthalmology

## 2021-05-21 ENCOUNTER — Encounter (INDEPENDENT_AMBULATORY_CARE_PROVIDER_SITE_OTHER): Payer: Self-pay | Admitting: Ophthalmology

## 2021-05-21 ENCOUNTER — Other Ambulatory Visit: Payer: Self-pay

## 2021-05-21 DIAGNOSIS — E113412 Type 2 diabetes mellitus with severe nonproliferative diabetic retinopathy with macular edema, left eye: Secondary | ICD-10-CM | POA: Diagnosis not present

## 2021-05-21 DIAGNOSIS — E113413 Type 2 diabetes mellitus with severe nonproliferative diabetic retinopathy with macular edema, bilateral: Secondary | ICD-10-CM | POA: Diagnosis not present

## 2021-05-21 DIAGNOSIS — E113411 Type 2 diabetes mellitus with severe nonproliferative diabetic retinopathy with macular edema, right eye: Secondary | ICD-10-CM

## 2021-05-21 MED ORDER — BEVACIZUMAB 2.5 MG/0.1ML IZ SOSY
2.5000 mg | PREFILLED_SYRINGE | INTRAVITREAL | Status: AC | PRN
  Administered 2021-05-21: 2.5 mg via INTRAVITREAL

## 2021-05-21 NOTE — Assessment & Plan Note (Signed)
Center involved CSME OS, will commence with therapy with antivegF, Avastin today  Patient may continue however with catheter extraction with intraocular lens placement to maximize visual acuity but also medical monitoring of the of the posterior pole, retinal conditions in each eye.

## 2021-05-21 NOTE — Assessment & Plan Note (Signed)
At 1 week follow-up post injection Avastin for center involved CSME, much less thickening yet still active.  We will continue to follow-up as scheduled  Patient may continue however with catheter extraction with intraocular lens placement to maximize visual acuity but also medical monitoring of the of the posterior pole, retinal conditions in each eye.

## 2021-05-21 NOTE — Progress Notes (Signed)
05/21/2021     CHIEF COMPLAINT Patient presents for Retina Follow Up (1 week fu OS and Avastin OS/Pt states VA OU stable since last visit. Pt denies FOL, floaters, or ocular pain OU. /A1C:8.0/LBS:072)   HISTORY OF PRESENT ILLNESS: Sara Graves is a 80 y.o. female who presents to the clinic today for:   HPI     Retina Follow Up           Diagnosis: Diabetic Retinopathy   Laterality: left eye   Onset: 1 week ago   Severity: mild   Duration: 1 week   Course: stable   Comments: 1 week fu OS and Avastin OS Pt states VA OU stable since last visit. Pt denies FOL, floaters, or ocular pain OU.  A1C:8.0 LBS:072       Last edited by Kendra Opitz, COA on 05/21/2021  8:15 AM.      Referring physician: [name], PA-C [place],  [place] [address]  HISTORICAL INFORMATION:   Selected notes from the MEDICAL RECORD NUMBER    Lab Results  Component Value Date   HGBA1C 9.8 (H) 02/12/2021     CURRENT MEDICATIONS: No current outpatient medications on file. (Ophthalmic Drugs)   No current facility-administered medications for this visit. (Ophthalmic Drugs)   Current Outpatient Medications (Other)  Medication Sig   alendronate (FOSAMAX) 70 MG tablet Take 1 tablet (70 mg total) by mouth once a week.   ALPRAZolam (XANAX) 0.25 MG tablet Take 1 tablet (0.25 mg total) by mouth as directed. 1/2 tablet r twice daily or one tab at night for anxiety   aspirin EC 81 MG tablet Take 81 mg by mouth daily.   busPIRone (BUSPAR) 5 MG tablet Take 5 mg by mouth 2 (two) times daily. As needed   Calcium Carb-Cholecalciferol (CALCIUM 600 + D PO) Take 1 tablet by mouth daily.    dapagliflozin propanediol (FARXIGA) 10 MG TABS tablet Take 1 tablet (10 mg total) by mouth daily.   diclofenac Sodium (VOLTAREN) 1 % GEL APPLY 2 G TOPICALLY FOUR TIMES DAILY.   ezetimibe (ZETIA) 10 MG tablet Take 10 mg by mouth daily.   FLUoxetine (PROZAC) 10 MG capsule Take 10 mg by mouth daily.    furosemide (LASIX) 40 MG tablet TAKE 1 TABLET (40 MG TOTAL) BY MOUTH DAILY. (Patient taking differently: Take by mouth daily as needed for fluid or edema.)   gabapentin (NEURONTIN) 100 MG capsule Take 100 mg by mouth at bedtime.   glucose blood test strip 1 each 3 (three) times daily.    insulin aspart (NOVOLOG FLEXPEN) 100 UNIT/ML FlexPen Inject into the skin.   insulin degludec (TRESIBA) 100 UNIT/ML FlexTouch Pen Inject into the skin.   isosorbide dinitrate (ISORDIL) 30 MG tablet TAKE 1 TABLET BY MOUTH 3 TIMES DAILY.   levothyroxine (SYNTHROID, LEVOTHROID) 50 MCG tablet Take 50 mcg by mouth daily before breakfast.    Magnesium Oxide 500 MG TABS TAKE 1 TABLET (500 MG TOTAL) BY MOUTH IN THE MORNING AND AT BEDTIME. (Patient taking differently: Take 500 mg by mouth in the morning, at noon, and at bedtime.)   metoprolol succinate (TOPROL-XL) 25 MG 24 hr tablet TAKE 2 TABLETS (50 MG TOTAL) BY MOUTH DAILY. TAKE WITH OR IMMEDIATELY FOLLOWING A MEAL.   nitroGLYCERIN (NITROSTAT) 0.4 MG SL tablet PLACE 1 TABLET UNDER THE TONGUE EVERY 5 MINUTES X 3 DOSES AS NEEDED FOR CHEST PAIN. (Patient taking differently: Place 0.4 mg under the tongue every  5 (five) minutes as needed for chest pain.)   ONETOUCH VERIO test strip 1 each by Other route 3 (three) times daily.   rosuvastatin (CRESTOR) 40 MG tablet Take 1 tablet (40 mg total) by mouth at bedtime.   sacubitril-valsartan (ENTRESTO) 97-103 MG Take 1 tablet by mouth 2 (two) times daily.   spironolactone (ALDACTONE) 25 MG tablet Take 25 mg by mouth daily.   vitamin B-12 (CYANOCOBALAMIN) 1000 MCG tablet Take 1,000 mcg by mouth daily.    Vitamin D, Ergocalciferol, (DRISDOL) 50000 units CAPS capsule Take 50,000 Units by mouth every 7 (seven) days. Take on Thursdays   No current facility-administered medications for this visit. (Other)      REVIEW OF SYSTEMS:    ALLERGIES Allergies  Allergen Reactions   Rosiglitazone Maleate Anaphylaxis and Swelling    Morphine Other (See Comments)    SEVERE HYPOTENSION    Cephalexin Diarrhea   Sulfa Antibiotics Diarrhea and Nausea And Vomiting   Tramadol Other (See Comments)    Unknown reaction   Elavil [Amitriptyline] Nausea Only   Tramadol-Acetaminophen Rash    PAST MEDICAL HISTORY Past Medical History:  Diagnosis Date   Acute combined systolic and diastolic heart failure ([facility]) 04/25/2018   Anxiety    Arthritis    "hands" (12/29/2017)   Basal cell carcinoma of nose    removed   Bursitis of left shoulder    BV ICD Med[employer]onic [place] MRI CRTD 06/21/2019   Cat scratch fever    Late 90s   Chronic combined systolic and diastolic CHF (congestive heart failure) ([facility]) 06/08/2019   Coronary artery disease    Depression    Diabetes mellitus, type II, insulin dependent ([place])    Encounter for assessment of implantable cardioverter-defibrillator (ICD) 09/28/2019   GERD (gastroesophageal reflux disease)    H. pylori infection 2008 and 1998   treated   Hyperlipidemia    Hypertension    Hypothyroidism    Low oxygen saturation    Multinodular goiter    Rotator cuff tear, left recurrent    Urge urinary incontinence    UTI (lower urinary tract infection) 05/2016   Past Surgical History:  Procedure Laterality Date   APPENDECTOMY  AGE 89   BACK SURGERY     BASAL CELL CARCINOMA EXCISION     "nose"   BIV ICD INSERTION CRT-D N/A 06/21/2019   Procedure: BIV ICD INSERTION CRT-D;  Surgeon: Evans Lance, MD;  Location: [facility];  Service: Cardiovascular;  Laterality: N/A;   BLADDER NECK SUSPENSION  1970's   BUNIONECTOMY WITH HAMMERTOE RECONSTRUCTION Bilateral    CARPAL TUNNEL RELEASE Right 05/2017   COLONOSCOPY W/ POLYPECTOMY     CORONARY ANGIOPLASTY WITH STENT PLACEMENT  12/29/2017   CORONARY BALLOON ANGIOPLASTY N/A 02/08/2019   Procedure: CORONARY BALLOON ANGIOPLASTY;  Surgeon: Adrian Prows, MD;  Location: [facility];  Service: Cardiovascular;  Laterality: N/A;   CORONARY STENT INTERVENTION  N/A 12/29/2017   Procedure: CORONARY STENT INTERVENTION;  Surgeon: Nigel Mormon, MD;  Location: [facility];  Service: Cardiovascular;  Laterality: N/A;   CORONARY STENT INTERVENTION N/A 04/26/2018   Procedure: CORONARY STENT INTERVENTION;  Surgeon: Nigel Mormon, MD;  Location: [facility];  Service: Cardiovascular;  Laterality: N/A;   CORONARY STENT INTERVENTION N/A 02/08/2019   Procedure: CORONARY STENT INTERVENTION;  Surgeon: Adrian Prows, MD;  Location: [facility];  Service: Cardiovascular;  Laterality: N/A;   ESOPHAGOGASTRODUODENOSCOPY ENDOSCOPY     FORAMINAL DECOMPRESSION AT  L2 TO THE SACRUM  01-05-2008   L2  -  S1   GANGLION CYST EXCISION Left 01/17/2009   ring finger   IMPLANTATION PERMANENT SPINAL CORD STIMULATOR  06-15-2008   JUNE 2013--  BATTERY CHANGE   JOINT REPLACEMENT     LEFT HEART CATH AND CORONARY ANGIOGRAPHY N/A 04/26/2018   Procedure: LEFT HEART CATH AND CORONARY ANGIOGRAPHY;  Surgeon: Nigel Mormon, MD;  Location: [facility];  Service: Cardiovascular;  Laterality: N/A;   LEFT HEART CATH AND CORONARY ANGIOGRAPHY N/A 02/08/2019   Procedure: LEFT HEART CATH AND CORONARY ANGIOGRAPHY;  Surgeon: Adrian Prows, MD;  Location: [facility];  Service: Cardiovascular;  Laterality: N/A;   LIPOMA EXCISION Right    RIGHT ELBOW   METATARSAL HEAD EXCISION Right 06/16/2018   Procedure: right 5th metatarsal excision, a mini c-arm;  Surgeon: Trula Slade, DPM;  Location: [facility];  Service: Podiatry;  Laterality: Right;  anesthesia can do block   RIGHT/LEFT HEART CATH AND CORONARY ANGIOGRAPHY N/A 12/29/2017   Procedure: RIGHT/LEFT HEART CATH AND CORONARY ANGIOGRAPHY;  Surgeon: Nigel Mormon, MD;  Location: [facility];  Service: Cardiovascular;  Laterality: N/A;   SHOULDER OPEN ROTATOR CUFF REPAIR  09/07/2012   Procedure: ROTATOR CUFF REPAIR SHOULDER OPEN;  Surgeon: Magnus Sinning, MD;  Location: [facility];  Service: Orthopedics;  Laterality: Left;  OPEN ANTERIOR ACROMIONECTOMY AND ROTATOR CUFF REPAIR ON LEFT    SHOULDER OPEN ROTATOR CUFF REPAIR Left 12/28/2012   Procedure: ROTATOR CUFF REPAIR SHOULDER OPEN;  Surgeon: Magnus Sinning, MD;  Location: [facility];  Service: Orthopedics;  Laterality: Left;  OPEN SHOULDER ROTATOR CUFF REPAIR ON LEFT  WITH ANTERIOR ACROMINECTOMY    SHOULDER OPEN ROTATOR CUFF REPAIR Right 03/28/2003   RIGHT SHOULDER  DEGENERATIVE AC JOINT AND RC TEAR   SPINAL CORD STIMULATOR BATTERY EXCHANGE N/A 07/03/2016   Procedure: SPINAL CORD STIMULATOR BATTERY PLACMENT;  Surgeon: Melina Schools, MD;  Location: [facility];  Service: Orthopedics;  Laterality: N/A;   TONSILLECTOMY  AGE 59   TOTAL KNEE ARTHROPLASTY Right 05/06/2000   OA RIGHT KNEE   VAGINAL HYSTERECTOMY  1970's    FAMILY HISTORY Family History  Problem Relation Age of Onset   Cancer Mother        breast   Heart Problems Mother    Prostate cancer Father    Muscular dystrophy Son    Lung cancer Son    Cancer Maternal Grandmother        breast   Heart disease Maternal Grandfather    Breast cancer Sister     SOCIAL HISTORY Social History   Tobacco Use   Smoking status: Never   Smokeless tobacco: Never  Vaping Use   Vaping Use: Never used  Substance Use Topics   Alcohol use: No   Drug use: No         OPHTHALMIC EXAM:  Base Eye Exam     Visual Acuity (ETDRS)       Right Left   Dist cc 20/100 -2 20/100   Dist ph cc NI NI    Correction: Glasses         Tonometry (Tonopen, 8:18 AM)       Right Left   Pressure 16 13         Pupils       Pupils Dark Light Shape React APD   Right PERRL 4 3 Round Brisk None   Left  PERRL 4 3 Round Brisk None         Visual Fields (Counting fingers)       Left Right    Full Full         Extraocular Movement       Right Left    Full Full         Neuro/Psych     Oriented x3: Yes         Dilation     Left  eye: 1.0% Mydriacyl, 2.5% Phenylephrine @ 8:19 AM           Slit Lamp and Fundus Exam     External Exam       Right Left   External Normal Normal         Slit Lamp Exam       Right Left   Lids/Lashes Normal Normal   Conjunctiva/Sclera White and quiet White and quiet   Cornea Clear Clear   Anterior Chamber Deep and quiet Deep and quiet   Iris Round and reactive Round and reactive   Lens 3+ Nuclear sclerosis 3+ Nuclear sclerosis   Anterior Vitreous Normal Normal         Fundus Exam       Right Left   Posterior Vitreous Normal Normal   Disc Normal Normal   C/D Ratio 0.3 0.3   Macula Microaneurysms, Macular thickening, Moderate clinically significant macular edema, center involved Microaneurysms, Macular thickening, Moderate clinically significant macular edema, superotemporal to FAZ   Vessels NPDR-Severe NPDR-Severe   Periphery Normal Normal            IMAGING AND PROCEDURES  Imaging and Procedures for 05/21/21  OCT, Retina - OU - Both Eyes       Right Eye Quality was good. Scan locations included subfoveal. Central Foveal Thickness: 373. Progression has no prior data. Findings include abnormal foveal contour, cystoid macular edema, vitreomacular adhesion .   Left Eye Quality was good. Scan locations included subfoveal. Central Foveal Thickness: 324. Progression has no prior data. Findings include abnormal foveal contour, cystoid macular edema, vitreomacular adhesion .   Notes CSME OU, with center involvement OD, now 1 week post recent Avastin OD improved.  OS stable CSME for planned Avastin injection today       Intravitreal Injection, Pharmacologic Agent - OS - Left Eye       Time Out 05/21/2021. 8:33 AM. Confirmed correct patient, procedure, site, and patient consented.   Anesthesia Anesthetic medications included [place] 3.5%.   Procedure Preparation included Tobramycin 0.3%, Ofloxacin , 5% betadine to ocular surface, 10% betadine to eyelids.  A 30 gauge needle was used.   Injection: 2.5 mg bevacizumab 2.5 MG/0.1ML   Route: Intravitreal, Site: Left Eye   NDC: [phone], Lot: [id number]   Post-op Post injection exam found visual acuity of at least counting fingers. The patient tolerated the procedure well. There were no complications. The patient received written and verbal post procedure care education. Post injection medications were not given.              ASSESSMENT/PLAN:  Severe nonproliferative diabetic retinopathy of right eye, with macular edema, associated with type 2 diabetes mellitus (HCC) At 1 week follow-up post injection Avastin for center involved CSME, much less thickening yet still active.  We will continue to follow-up as scheduled  Patient may continue however with catheter extraction with intraocular lens placement to maximize visual acuity but also medical monitoring of the of the posterior pole, retinal  conditions in each eye.  Severe nonproliferative diabetic retinopathy of left eye, with macular edema, associated with type 2 diabetes mellitus ([place]) Center involved CSME OS, will commence with therapy with antivegF, Avastin today  Patient may continue however with catheter extraction with intraocular lens placement to maximize visual acuity but also medical monitoring of the of the posterior pole, retinal conditions in each eye.     ICD-10-CM   1. Severe nonproliferative diabetic retinopathy of left eye, with macular edema, associated with type 2 diabetes mellitus (HCC)  T34.2876 OCT, Retina - OU - Both Eyes    Intravitreal Injection, Pharmacologic Agent - OS - Left Eye    bevacizumab (AVASTIN) SOSY 2.5 mg    2. Severe nonproliferative diabetic retinopathy of right eye, with macular edema, associated with type 2 diabetes mellitus ([facility])  E11.3411       1.  OD, improved CSME post injection Avastin No. 1 for center involved CSME.  We will repeat injection OD as scheduled  2.  OS or sooner involved  CSME for injection #1 Avastin,  3.Patient may continue and proceed promptly however with catheter extraction with intraocular lens placement to maximize visual acuity but also medical monitoring of the of the posterior pole, retinal conditions in each eye.  Ophthalmic Meds Ordered this visit:  Meds ordered this encounter  Medications   bevacizumab (AVASTIN) SOSY 2.5 mg       Return in about 5 weeks (around 06/25/2021) for dilate, OS, AVASTIN OCT.  There are no Patient Instructions on file for this visit.   Explained the diagnoses, plan, and follow up with the patient and they expressed understanding.  Patient expressed understanding of the importance of proper follow up care.   Clent Demark Shalinda Burkholder M.D. Diseases & Surgery of the Retina and Vitreous Retina & Diabetic [facility] 05/21/21     Abbreviations: M myopia (nearsighted); A astigmatism; H hyperopia (farsighted); P presbyopia; Mrx spectacle prescription;  CTL contact lenses; OD right eye; OS left eye; OU both eyes  XT exotropia; ET esotropia; PEK punctate epithelial keratitis; PEE punctate epithelial erosions; DES dry eye syndrome; MGD meibomian gland dysfunction; ATs artificial tears; PFAT's preservative free artificial tears; [place] nuclear sclerotic cataract; PSC posterior subcapsular cataract; ERM epi-retinal membrane; PVD posterior vitreous detachment; RD retinal detachment; DM diabetes mellitus; DR diabetic retinopathy; NPDR non-proliferative diabetic retinopathy; PDR proliferative diabetic retinopathy; CSME clinically significant macular edema; DME diabetic macular edema; dbh dot blot hemorrhages; CWS cotton wool spot; POAG primary open angle glaucoma; C/D cup-to-disc ratio; HVF humphrey visual field; GVF goldmann visual field; OCT optical coherence tomography; IOP intraocular pressure; BRVO Branch retinal vein occlusion; CRVO central retinal vein occlusion; CRAO central retinal artery occlusion; BRAO branch retinal artery occlusion; RT  retinal tear; SB scleral buckle; PPV pars plana vitrectomy; VH Vitreous hemorrhage; PRP panretinal laser photocoagulation; IVK intravitreal kenalog; VMT vitreomacular traction; MH Macular hole;  NVD neovascularization of the disc; NVE neovascularization elsewhere; AREDS age related eye disease study; ARMD age related macular degeneration; POAG primary open angle glaucoma; EBMD epithelial/anterior basement membrane dystrophy; ACIOL anterior chamber intraocular lens; IOL intraocular lens; PCIOL posterior chamber intraocular lens; Phaco/IOL phacoemulsification with intraocular lens placement; [place] photorefractive keratectomy; LASIK laser assisted in situ keratomileusis; HTN hypertension; DM diabetes mellitus; COPD chronic obstructive pulmonary disease

## 2021-05-24 ENCOUNTER — Encounter: Payer: Self-pay | Admitting: Cardiology

## 2021-05-24 ENCOUNTER — Ambulatory Visit: Payer: Medicare PPO | Admitting: Cardiology

## 2021-05-24 ENCOUNTER — Other Ambulatory Visit: Payer: Self-pay

## 2021-05-24 VITALS — BP 157/38 | HR 53 | Temp 97.1°F | Ht 62.0 in | Wt 185.0 lb

## 2021-05-24 DIAGNOSIS — E782 Mixed hyperlipidemia: Secondary | ICD-10-CM

## 2021-05-24 DIAGNOSIS — I10 Essential (primary) hypertension: Secondary | ICD-10-CM

## 2021-05-24 DIAGNOSIS — Z95810 Presence of automatic (implantable) cardiac defibrillator: Secondary | ICD-10-CM

## 2021-05-24 DIAGNOSIS — I2510 Atherosclerotic heart disease of native coronary artery without angina pectoris: Secondary | ICD-10-CM

## 2021-05-24 DIAGNOSIS — Z794 Long term (current) use of insulin: Secondary | ICD-10-CM

## 2021-05-24 DIAGNOSIS — I447 Left bundle-branch block, unspecified: Secondary | ICD-10-CM

## 2021-05-24 DIAGNOSIS — I5042 Chronic combined systolic (congestive) and diastolic (congestive) heart failure: Secondary | ICD-10-CM

## 2021-05-24 DIAGNOSIS — I255 Ischemic cardiomyopathy: Secondary | ICD-10-CM

## 2021-05-24 DIAGNOSIS — E1159 Type 2 diabetes mellitus with other circulatory complications: Secondary | ICD-10-CM

## 2021-05-24 DIAGNOSIS — Z9981 Dependence on supplemental oxygen: Secondary | ICD-10-CM

## 2021-05-24 DIAGNOSIS — Z955 Presence of coronary angioplasty implant and graft: Secondary | ICD-10-CM

## 2021-05-24 MED ORDER — METOPROLOL SUCCINATE ER 50 MG PO TB24: 50.0000 mg | ORAL_TABLET | Freq: Every day | ORAL | 0 refills | Status: DC

## 2021-05-24 NOTE — Progress Notes (Signed)
Primary Physician:  [name], PA-C   [name], female    DOB: [birth year], 80 y.o.   MRN: [id number]   Date: 05/24/21 Last Office Visit: 02/22/2021  Subjective:    Chief Complaint  Patient presents with   Congestive Heart Failure   Follow-up    HPI: Sara Graves  is a 80 y.o. female  with hypertension, insulin-dependent diabetes mellitus type 2,  CAD s/p prior LAD and ramus PCI,  HFrEF, ischemic cardiomyopathy, s/p BiV ICD implantation 06/2019,  h/o Rt leg cellulitis and metatarsal fracture (managed by Podiatry) with chief complaint of " followup for congestive heart failure."   Patient was last hospitalized for heart failure exacerbation in March 2022 most likely exacerbated due to dietary indiscretion after losing her son.  During that hospitalization patient was diuresed a total negative urine output of 8 L.  Since then her medications have been uptitrated and is here for 34-month follow-up.  Since last office visit patient states that she is doing well from a cardiovascular standpoint.  She denies heart failure symptoms.  She is compliant with her medical therapy.  Most recent labs from April 2022 independently reviewed.  Most recent pacemaker transmission reviewed with the patient as well and noted below for further reference.  She is requiring less supplemental oxygen on a daily basis.  Her recent coronary angiogram was on 01/27/19 and PCI to LAD  and balloon PTCA to IST RI lesion.  Does not complain of chest discomfort at rest or with effort related activities.  No recent use of sublingual nitroglycerin tablets.    Past Medical History:  Diagnosis Date   Acute combined systolic and diastolic heart failure ([place]) 04/25/2018   Anxiety    Arthritis    "hands" (12/29/2017)   Basal cell carcinoma of nose    removed   Bursitis of left shoulder    BV ICD Med[employer]onic [place] MRI CRTD 06/21/2019   Cat scratch fever    Late 90s   Chronic combined systolic and  diastolic CHF (congestive heart failure) ([place]) 06/08/2019   Coronary artery disease    Depression    Diabetes mellitus, type II, insulin dependent ([place])    Encounter for assessment of implantable cardioverter-defibrillator (ICD) 09/28/2019   GERD (gastroesophageal reflux disease)    H. pylori infection 2008 and 1998   treated   Hyperlipidemia    Hypertension    Hypothyroidism    Low oxygen saturation    Multinodular goiter    Rotator cuff tear, left recurrent    Urge urinary incontinence    UTI (lower urinary tract infection) 05/2016    Past Surgical History:  Procedure Laterality Date   APPENDECTOMY  AGE 20   BACK SURGERY     BASAL CELL CARCINOMA EXCISION     "nose"   BIV ICD INSERTION CRT-D N/A 06/21/2019   Procedure: BIV ICD INSERTION CRT-D;  Surgeon: Evans Lance, MD;  Location: [facility] [facility];  Service: Cardiovascular;  Laterality: N/A;   BLADDER NECK SUSPENSION  1970's   BUNIONECTOMY WITH HAMMERTOE RECONSTRUCTION Bilateral    CARPAL TUNNEL RELEASE Right 05/2017   COLONOSCOPY W/ POLYPECTOMY     CORONARY ANGIOPLASTY WITH STENT PLACEMENT  12/29/2017   CORONARY BALLOON ANGIOPLASTY N/A 02/08/2019   Procedure: CORONARY BALLOON ANGIOPLASTY;  Surgeon: Adrian Prows, MD;  Location: [place] [facility];  Service: Cardiovascular;  Laterality: N/A;   CORONARY STENT INTERVENTION N/A 12/29/2017   Procedure: CORONARY STENT INTERVENTION;  Surgeon:  Patwardhan, Reynold Bowen, MD;  Location: [place] [facility];  Service: Cardiovascular;  Laterality: N/A;   CORONARY STENT INTERVENTION N/A 04/26/2018   Procedure: CORONARY STENT INTERVENTION;  Surgeon: Nigel Mormon, MD;  Location: [facility] [facility];  Service: Cardiovascular;  Laterality: N/A;   CORONARY STENT INTERVENTION N/A 02/08/2019   Procedure: CORONARY STENT INTERVENTION;  Surgeon: Adrian Prows, MD;  Location: [place] [facility];  Service: Cardiovascular;  Laterality: N/A;   ESOPHAGOGASTRODUODENOSCOPY ENDOSCOPY     FORAMINAL DECOMPRESSION  AT L2 TO THE SACRUM  01-05-2008   L2  -  S1   GANGLION CYST EXCISION Left 01/17/2009   ring finger   IMPLANTATION PERMANENT SPINAL CORD STIMULATOR  06-15-2008   JUNE 2013--  BATTERY CHANGE   JOINT REPLACEMENT     LEFT HEART CATH AND CORONARY ANGIOGRAPHY N/A 04/26/2018   Procedure: LEFT HEART CATH AND CORONARY ANGIOGRAPHY;  Surgeon: Nigel Mormon, MD;  Location: [place] [facility] [facility];  Service: Cardiovascular;  Laterality: N/A;   LEFT HEART CATH AND CORONARY ANGIOGRAPHY N/A 02/08/2019   Procedure: LEFT HEART CATH AND CORONARY ANGIOGRAPHY;  Surgeon: Adrian Prows, MD;  Location: [place] [facility];  Service: Cardiovascular;  Laterality: N/A;   LIPOMA EXCISION Right    RIGHT ELBOW   METATARSAL HEAD EXCISION Right 06/16/2018   Procedure: right 5th metatarsal excision, a mini c-arm;  Surgeon: Trula Slade, DPM;  Location: [facility];  Service: Podiatry;  Laterality: Right;  anesthesia can do block   RIGHT/LEFT HEART CATH AND CORONARY ANGIOGRAPHY N/A 12/29/2017   Procedure: RIGHT/LEFT HEART CATH AND CORONARY ANGIOGRAPHY;  Surgeon: Nigel Mormon, MD;  Location: [place] [facility];  Service: Cardiovascular;  Laterality: N/A;   SHOULDER OPEN ROTATOR CUFF REPAIR  09/07/2012   Procedure: ROTATOR CUFF REPAIR SHOULDER OPEN;  Surgeon: Magnus Sinning, MD;  Location: [place];  Service: Orthopedics;  Laterality: Left;  OPEN ANTERIOR ACROMIONECTOMY AND ROTATOR CUFF REPAIR ON LEFT    SHOULDER OPEN ROTATOR CUFF REPAIR Left 12/28/2012   Procedure: ROTATOR CUFF REPAIR SHOULDER OPEN;  Surgeon: Magnus Sinning, MD;  Location: [place];  Service: Orthopedics;  Laterality: Left;  OPEN SHOULDER ROTATOR CUFF REPAIR ON LEFT  WITH ANTERIOR ACROMINECTOMY    SHOULDER OPEN ROTATOR CUFF REPAIR Right 03/28/2003   RIGHT SHOULDER  DEGENERATIVE AC JOINT AND RC TEAR   SPINAL CORD STIMULATOR BATTERY EXCHANGE N/A 07/03/2016   Procedure: SPINAL CORD STIMULATOR BATTERY PLACMENT;  Surgeon:  Melina Schools, MD;  Location: [place] [facility];  Service: Orthopedics;  Laterality: N/A;   TONSILLECTOMY  AGE 52   TOTAL KNEE ARTHROPLASTY Right 05/06/2000   OA RIGHT KNEE   VAGINAL HYSTERECTOMY  1970's   Social History   Tobacco Use   Smoking status: Never   Smokeless tobacco: Never  Vaping Use   Vaping Use: Never used  Substance Use Topics   Alcohol use: No   Drug use: No    Review of Systems  Constitutional: Negative for chills, decreased appetite, malaise/fatigue and weight gain.  Cardiovascular:  Positive for dyspnea on exertion (improving). Negative for chest pain, leg swelling, orthopnea and syncope.  Respiratory:  Negative for cough.   Endocrine: Negative for cold intolerance.  Hematologic/Lymphatic: Does not bruise/bleed easily.  Musculoskeletal:  Negative for falls and joint swelling.  Gastrointestinal:  Negative for abdominal pain, anorexia, change in bowel habit, diarrhea and nausea.  Neurological:  Negative for headaches and light-headedness.  Psychiatric/Behavioral:  Negative for depression and substance abuse.  All other systems reviewed and are negative.    Objective:   Vitals with BMI 05/24/2021 05/24/2021 02/22/2021  Height - 5\' 2"  5\' 2"   Weight - 185 lbs 166 lbs  BMI - 10.62 69.48  Systolic 546 270 350  Diastolic 38 56 61  Pulse 53 56 61   Physical Exam Vitals reviewed.  Constitutional:      General: She is not in acute distress.    Appearance: She is well-developed.  HENT:     Head: Atraumatic.  Eyes:     Conjunctiva/sclera: Conjunctivae normal.  Neck:     Thyroid: No thyromegaly.     Vascular: No JVD.  Cardiovascular:     Rate and Rhythm: Normal rate and regular rhythm.     Pulses: Intact distal pulses.          Carotid pulses are 2+ on the right side with bruit and 2+ on the left side.      Femoral pulses are 2+ on the right side and 2+ on the left side.      Popliteal pulses are 0 on the right side and 0 on the left side.       Dorsalis pedis pulses  are 0 on the right side and 0 on the left side.       Posterior tibial pulses are 0 on the right side and 0 on the left side.     Heart sounds: Murmur heard.    No gallop.     Comments: Pacemaker/ICD site is clean dry and intact.  Soft holosystolic murmur heard at the apex. Pulmonary:     Effort: Pulmonary effort is normal. No respiratory distress.     Breath sounds: No stridor. No wheezing or rales.  Abdominal:     General: Bowel sounds are normal.     Palpations: Abdomen is soft.  Musculoskeletal:        General: Normal range of motion.     Cervical back: Neck supple.     Right lower leg: No edema.     Left lower leg: No swelling. No edema.  Skin:    General: Skin is warm and dry.  Neurological:     Mental Status: She is alert.   Radiology: No results found.  Laboratory examination:   CMP Latest Ref Rng & Units 02/22/2021 02/15/2021 02/14/2021  Glucose 65 - 99 mg/dL 154(H) 183(H) 205(H)  BUN 8 - 27 mg/dL 37(H) 26(H) 21  Creatinine 0.57 - 1.00 mg/dL 1.23(H) 1.21(H) 1.34(H)  Sodium 134 - 144 mmol/L 144 137 139  Potassium 3.5 - 5.2 mmol/L 4.7 4.1 3.7  Chloride 96 - 106 mmol/L 103 98 97(L)  CO2 20 - 29 mmol/L 23 33(H) 35(H)  Calcium 8.7 - 10.3 mg/dL 9.6 8.6(L) 8.7(L)  Total Protein 6.0 - 8.5 g/dL - - -  Total Bilirubin 0.0 - 1.2 mg/dL - - -  Alkaline Phos 39 - 117 IU/L - - -  AST 0 - 40 IU/L - - -  ALT 0 - 32 IU/L - - -   CBC Latest Ref Rng & Units 02/12/2021 02/11/2021 08/14/2020  WBC 4.0 - 10.5 K/uL 4.1 4.4 -  Hemoglobin 12.0 - 15.0 g/dL 11.9(L) 13.1 11.9  Hematocrit 36.0 - 46.0 % 36.5 41.0 34.2  Platelets 150 - 400 K/uL 92(L) 94(L) -   Lipid Panel     Component Value Date/Time   CHOL 171 02/07/2020 1524   TRIG 62 02/07/2020 1524   HDL 55 02/07/2020 1524  CHOLHDL 2.9 12/26/2019 1407   CHOLHDL 3.9 03/25/2015 0325   VLDL 23 03/25/2015 0325   LDLCALC 104 (H) 02/07/2020 1524   LDLDIRECT 90 04/08/2012 1414   HEMOGLOBIN A1C Lab Results  Component Value Date   HGBA1C  9.8 (H) 02/12/2021   MPG 234.56 02/12/2021   TSH No results for input(s): TSH in the last 8760 hours. BNP (last 3 results) Recent Labs    02/11/21 2256 02/13/21 0218 02/14/21 0026  BNP 4,088.9* 2,726.8* 1,477.4*    ProBNP (last 3 results) Recent Labs    08/29/20 1559 11/14/20 1335 02/22/21 1422  PROBNP 3,594* 10,511* 5,475*   PRN Meds:. Medications Discontinued During This Encounter  Medication Reason   metoprolol succinate (TOPROL-XL) 25 MG 24 hr tablet Reorder   Current Meds  Medication Sig   alendronate (FOSAMAX) 70 MG tablet Take 1 tablet (70 mg total) by mouth once a week.   aspirin EC 81 MG tablet Take 81 mg by mouth daily.   busPIRone (BUSPAR) 5 MG tablet Take 5 mg by mouth 2 (two) times daily. As needed   Calcium Carb-Cholecalciferol (CALCIUM 600 + D PO) Take 1 tablet by mouth daily.    dapagliflozin propanediol (FARXIGA) 10 MG TABS tablet Take 1 tablet (10 mg total) by mouth daily.   ENTRESTO 97-103 MG TAKE 1 TABLET BY MOUTH TWICE A DAY   ezetimibe (ZETIA) 10 MG tablet Take 10 mg by mouth daily.   FLUoxetine (PROZAC) 10 MG capsule Take 10 mg by mouth daily.   furosemide (LASIX) 40 MG tablet TAKE 1 TABLET (40 MG TOTAL) BY MOUTH DAILY. (Patient taking differently: Take by mouth daily as needed for fluid or edema.)   gabapentin (NEURONTIN) 100 MG capsule Take 100 mg by mouth at bedtime.   glucose blood test strip 1 each 3 (three) times daily.    insulin aspart (NOVOLOG FLEXPEN) 100 UNIT/ML FlexPen Inject into the skin.   insulin degludec (TRESIBA) 100 UNIT/ML FlexTouch Pen Inject into the skin.   isosorbide dinitrate (ISORDIL) 30 MG tablet TAKE 1 TABLET BY MOUTH 3 TIMES DAILY.   levothyroxine (SYNTHROID, LEVOTHROID) 50 MCG tablet Take 50 mcg by mouth daily before breakfast.    Magnesium Oxide 500 MG TABS TAKE 1 TABLET (500 MG TOTAL) BY MOUTH IN THE MORNING AND AT BEDTIME. (Patient taking differently: Take 500 mg by mouth in the morning, at noon, and at bedtime.)    nitroGLYCERIN (NITROSTAT) 0.4 MG SL tablet PLACE 1 TABLET UNDER THE TONGUE EVERY 5 MINUTES X 3 DOSES AS NEEDED FOR CHEST PAIN. (Patient taking differently: Place 0.4 mg under the tongue every 5 (five) minutes as needed for chest pain.)   ONETOUCH VERIO test strip 1 each by Other route 3 (three) times daily.   rosuvastatin (CRESTOR) 40 MG tablet Take 1 tablet (40 mg total) by mouth at bedtime.   spironolactone (ALDACTONE) 25 MG tablet Take 25 mg by mouth daily.   vitamin B-12 (CYANOCOBALAMIN) 1000 MCG tablet Take 1,000 mcg by mouth daily.    Vitamin D, Ergocalciferol, (DRISDOL) 50000 units CAPS capsule Take 50,000 Units by mouth every 7 (seven) days. Take on Thursdays   [DISCONTINUED] metoprolol succinate (TOPROL-XL) 25 MG 24 hr tablet TAKE 2 TABLETS (50 MG TOTAL) BY MOUTH DAILY. TAKE WITH OR IMMEDIATELY FOLLOWING A MEAL.    Cardiac Studies:   EKG: 02/22/2021: NSR, 64bpm, LBBB, rare PVCs.   Echocardiogram: 02/12/2021: LVEF 20-25%, severely reduced left ventricular systolic function, global hypokinesis, LV size moderately dilated, mild LVH, right ventricular  systolic function moderately reduced, RV size mildly enlarged, PASP 68 mmHg, severe left atrial dilatation, moderately dilated right atrium, moderate to severe MR, moderate to severe TR, mild AR, IVC dilated estimated RAP 15 mmHg.   Stress test: None   Heart catheterization: Coronary angiogram 02/08/2019:  Distal left main 20% diffuse disease.  Ostial LAD stent shows diffuse 20% in-stent restenosis (3.0 x 15 mm resolute Onyx on 12/29/2017).  Mid LAD and mid to distal LAD there are tandem lesions 80% and 95%.  Ramus intermediate ostial 95 to 99% stenosis.  Previously placed RI stent widely patent ( 2.0 x 12 mm resolute on 04/26/2018).  30 to 40% tandem lesions in the RCA. Cutting Balloon angioplasty of the ramus intermediate ostial high-grade stenosis, 99% reduced to 0% and Cutting Balloon angioplasty of the mid LAD followed by stenting with  2.5 x 32 mm Synergy DES for high-grade 90% stenosis reduced to 0% and TIMI-3 to TIMI-3 flow maintained in both lesions.   Carotid artery duplex 12/02/2018: No hemodynamically significant arterial disease in the internal carotid artery bilaterally. Minimal plaque noted bilateral carotid arteries. Antegrade right vertebral artery flow. Antegrade left vertebral artery flow.   Remote BiV ICD transmission 05/09/2021: Longevity 8 years.  Lead impedance and thresholds within normal limits.  There was 1 high ventricular rate episode for 5 seconds, EGM = brief AT.  Thoracic impedance monitoring is at baseline and no fluid overload state since beginning of May 2022.  Normal ICD function.   Assessment:     ICD-10-CM   1. Chronic combined systolic and diastolic heart failure, NYHA class 2 (HCC)  I50.42 metoprolol succinate (TOPROL-XL) 50 MG 24 hr tablet    Basic metabolic panel    Magnesium    Pro b natriuretic peptide (BNP)    2. Ischemic cardiomyopathy  I25.5 metoprolol succinate (TOPROL-XL) 50 MG 24 hr tablet    Lipid Panel With LDL/HDL Ratio    Lipoprotein A (LPA)    LDL cholesterol, direct    3. Biventricular ICD (implantable cardioverter-defibrillator) in place  Z95.810     4. Coronary artery disease involving native coronary artery of native heart without angina pectoris  I25.10     5. Status post primary angioplasty with coronary stent  Z95.5     6. Mixed hyperlipidemia  E78.2 Lipid Panel With LDL/HDL Ratio    Lipoprotein A (LPA)    LDL cholesterol, direct    7. Left bundle branch block  I44.7     8. Essential hypertension  I10     9. On supplemental oxygen therapy  Z99.81     10. Type 2 diabetes mellitus with other circulatory complication, with long-term current use of insulin (HCC)  E11.59    Z79.4      Recommendations:  Chronic combined systolic and diastolic heart failure, stage C, NYHA class III: Last heart failure hospitalization March 2022. Medications  reconciled. Check NT proBNP, BMP, magnesium level. May consider up titration of GDMT based on lab work. Patient uses Lasix on as needed basis. Most recent device interrogation from June 2022 notes baseline lead impedances/threshold. Patient is asked to keep a log of her weight on a daily basis.  If she notices weight gain of 1 pound over 24 hours or 3 pounds over the course of the week call the office.  Ischemic cardiomyopathy: See above  Biventricular ICD in situ: Most recent interrogation reviewed. Continue to monitor  Established coronary artery disease with prior angioplasty and stents: Denies angina pectoris. Medications reconciled  Patient is currently on Toprol-XL 25 mg 2 tabs once a day.  We will transition her Toprol-XL 50 mg p.o. daily. No use of sublingual nitroglycerin tablets and since last office encounter  Insulin-dependent diabetes mellitus type 2: Currently the primary team. Most recent a1c reviewed.   Long-term insulin use: See above  Mixed hyperlipidemia: Will check fasting lipid profile.   Reeducated on the importance of lifestyle and dietary changes. Currently on Crestor 40 mg p.o. nightly and Zetia at 10 mg p.o. daily  Current Outpatient Medications:    alendronate (FOSAMAX) 70 MG tablet, Take 1 tablet (70 mg total) by mouth once a week., Disp: 4 tablet, Rfl: 1   aspirin EC 81 MG tablet, Take 81 mg by mouth daily., Disp: , Rfl:    busPIRone (BUSPAR) 5 MG tablet, Take 5 mg by mouth 2 (two) times daily. As needed, Disp: , Rfl:    Calcium Carb-Cholecalciferol (CALCIUM 600 + D PO), Take 1 tablet by mouth daily. , Disp: , Rfl:    dapagliflozin propanediol (FARXIGA) 10 MG TABS tablet, Take 1 tablet (10 mg total) by mouth daily., Disp: 30 tablet, Rfl: 1   ENTRESTO 97-103 MG, TAKE 1 TABLET BY MOUTH TWICE A DAY, Disp: 180 tablet, Rfl: 0   ezetimibe (ZETIA) 10 MG tablet, Take 10 mg by mouth daily., Disp: , Rfl:    FLUoxetine (PROZAC) 10 MG capsule, Take 10 mg by mouth  daily., Disp: , Rfl:    furosemide (LASIX) 40 MG tablet, TAKE 1 TABLET (40 MG TOTAL) BY MOUTH DAILY. (Patient taking differently: Take by mouth daily as needed for fluid or edema.), Disp: 90 tablet, Rfl: 1   gabapentin (NEURONTIN) 100 MG capsule, Take 100 mg by mouth at bedtime., Disp: , Rfl:    glucose blood test strip, 1 each 3 (three) times daily. , Disp: , Rfl:    insulin aspart (NOVOLOG FLEXPEN) 100 UNIT/ML FlexPen, Inject into the skin., Disp: , Rfl:    insulin degludec (TRESIBA) 100 UNIT/ML FlexTouch Pen, Inject into the skin., Disp: , Rfl:    isosorbide dinitrate (ISORDIL) 30 MG tablet, TAKE 1 TABLET BY MOUTH 3 TIMES DAILY., Disp: 90 tablet, Rfl: 2   levothyroxine (SYNTHROID, LEVOTHROID) 50 MCG tablet, Take 50 mcg by mouth daily before breakfast. , Disp: , Rfl:    Magnesium Oxide 500 MG TABS, TAKE 1 TABLET (500 MG TOTAL) BY MOUTH IN THE MORNING AND AT BEDTIME. (Patient taking differently: Take 500 mg by mouth in the morning, at noon, and at bedtime.), Disp: 180 tablet, Rfl: 1   nitroGLYCERIN (NITROSTAT) 0.4 MG SL tablet, PLACE 1 TABLET UNDER THE TONGUE EVERY 5 MINUTES X 3 DOSES AS NEEDED FOR CHEST PAIN. (Patient taking differently: Place 0.4 mg under the tongue every 5 (five) minutes as needed for chest pain.), Disp: 25 tablet, Rfl: 2   ONETOUCH VERIO test strip, 1 each by Other route 3 (three) times daily., Disp: , Rfl: 5   rosuvastatin (CRESTOR) 40 MG tablet, Take 1 tablet (40 mg total) by mouth at bedtime., Disp: 90 tablet, Rfl: 3   spironolactone (ALDACTONE) 25 MG tablet, Take 25 mg by mouth daily., Disp: , Rfl:    vitamin B-12 (CYANOCOBALAMIN) 1000 MCG tablet, Take 1,000 mcg by mouth daily. , Disp: , Rfl:    Vitamin D, Ergocalciferol, (DRISDOL) 50000 units CAPS capsule, Take 50,000 Units by mouth every 7 (seven) days. Take on Thursdays, Disp: , Rfl:    ALPRAZolam (XANAX) 0.25 MG tablet, Take 1 tablet (0.25 mg  total) by mouth as directed. 1/2 tablet r twice daily or one tab at night for  anxiety, Disp: 30 tablet, Rfl: 0   diclofenac Sodium (VOLTAREN) 1 % GEL, APPLY 2 G TOPICALLY FOUR TIMES DAILY., Disp: 300 g, Rfl: 0   metoprolol succinate (TOPROL-XL) 50 MG 24 hr tablet, Take 1 tablet (50 mg total) by mouth daily. Take with or immediately following a meal., Disp: 90 tablet, Rfl: 0  Orders Placed This Encounter  Procedures   Lipid Panel With LDL/HDL Ratio   Lipoprotein A (LPA)   LDL cholesterol, direct   Basic metabolic panel   Magnesium   Pro b natriuretic peptide (BNP)   --Continue cardiac medications as reconciled in final medication list. --Return in about 3 months (around 08/24/2021) for Follow up, heart failure management.. Or sooner if needed. --Continue follow-up with your primary care physician regarding the management of your other chronic comorbid conditions.  Patient's questions and concerns were addressed to her satisfaction. She voices understanding of the instructions provided during this encounter.   This note was created using a voice recognition software as a result there may be grammatical errors inadvertently enclosed that do not reflect the nature of this encounter. Every attempt is made to correct such errors.  Total time spent: 36 minutes.  Rex Kras, [place], [facility]  Pager: [phone] Office: [phone]

## 2021-05-29 NOTE — Telephone Encounter (Signed)
error 

## 2021-06-04 LAB — LIPID PANEL WITH LDL/HDL RATIO
Cholesterol, Total: 203 mg/dL — ABNORMAL HIGH (ref 100–199)
HDL: 68 mg/dL (ref >39)
LDL Chol Calc (NIH): 119 mg/dL — ABNORMAL HIGH (ref 0–99)
LDL/HDL Ratio: 1.8 ratio (ref 0.0–3.2)
Triglycerides: 88 mg/dL (ref 0–149)
VLDL Cholesterol Cal: 16 mg/dL (ref 5–40)

## 2021-06-04 LAB — BASIC METABOLIC PANEL
BUN/Creatinine Ratio: 35 — ABNORMAL HIGH (ref 12–28)
BUN: 41 mg/dL — ABNORMAL HIGH (ref 8–27)
CO2: 20 mmol/L (ref 20–29)
Calcium: 9.5 mg/dL (ref 8.7–10.3)
Chloride: 113 mmol/L — ABNORMAL HIGH (ref 96–106)
Creatinine, Ser: 1.17 mg/dL — ABNORMAL HIGH (ref 0.57–1.00)
Glucose: 91 mg/dL (ref 65–99)
Potassium: 6.1 mmol/L — ABNORMAL HIGH (ref 3.5–5.2)
Sodium: 145 mmol/L — ABNORMAL HIGH (ref 134–144)
eGFR: 47 mL/min/{1.73_m2} — ABNORMAL LOW (ref >59)

## 2021-06-04 LAB — MAGNESIUM: Magnesium: 1.9 mg/dL (ref 1.6–2.3)

## 2021-06-04 LAB — LIPOPROTEIN A (LPA): Lipoprotein (a): 103.9 nmol/L — ABNORMAL HIGH (ref <75.0)

## 2021-06-04 LAB — PRO B NATRIURETIC PEPTIDE: NT-Pro BNP: 2497 pg/mL — ABNORMAL HIGH (ref 0–738)

## 2021-06-04 LAB — LDL CHOLESTEROL, DIRECT: LDL Direct: 112 mg/dL — ABNORMAL HIGH (ref 0–99)

## 2021-06-06 ENCOUNTER — Other Ambulatory Visit: Payer: Self-pay | Admitting: Cardiology

## 2021-06-07 ENCOUNTER — Encounter (HOSPITAL_COMMUNITY): Payer: Self-pay

## 2021-06-07 ENCOUNTER — Other Ambulatory Visit: Payer: Self-pay

## 2021-06-07 ENCOUNTER — Emergency Department (HOSPITAL_COMMUNITY)
Admission: EM | Admit: 2021-06-07 | Discharge: 2021-06-07 | Disposition: A | Discharge status: Home / Self Care | Payer: Medicare PPO | Attending: Emergency Medicine | Admitting: Emergency Medicine

## 2021-06-07 DIAGNOSIS — E039 Hypothyroidism, unspecified: Secondary | ICD-10-CM | POA: Insufficient documentation

## 2021-06-07 DIAGNOSIS — Z7982 Long term (current) use of aspirin: Secondary | ICD-10-CM | POA: Insufficient documentation

## 2021-06-07 DIAGNOSIS — Z79899 Other long term (current) drug therapy: Secondary | ICD-10-CM | POA: Diagnosis not present

## 2021-06-07 DIAGNOSIS — Z85828 Personal history of other malignant neoplasm of skin: Secondary | ICD-10-CM | POA: Insufficient documentation

## 2021-06-07 DIAGNOSIS — E1122 Type 2 diabetes mellitus with diabetic chronic kidney disease: Secondary | ICD-10-CM | POA: Diagnosis not present

## 2021-06-07 DIAGNOSIS — I2510 Atherosclerotic heart disease of native coronary artery without angina pectoris: Secondary | ICD-10-CM | POA: Insufficient documentation

## 2021-06-07 DIAGNOSIS — N183 Chronic kidney disease, stage 3 unspecified: Secondary | ICD-10-CM | POA: Insufficient documentation

## 2021-06-07 DIAGNOSIS — Z794 Long term (current) use of insulin: Secondary | ICD-10-CM | POA: Insufficient documentation

## 2021-06-07 DIAGNOSIS — I5043 Acute on chronic combined systolic (congestive) and diastolic (congestive) heart failure: Secondary | ICD-10-CM | POA: Diagnosis not present

## 2021-06-07 DIAGNOSIS — E875 Hyperkalemia: Secondary | ICD-10-CM | POA: Insufficient documentation

## 2021-06-07 DIAGNOSIS — Z96651 Presence of right artificial knee joint: Secondary | ICD-10-CM | POA: Diagnosis not present

## 2021-06-07 DIAGNOSIS — I130 Hypertensive heart and chronic kidney disease with heart failure and stage 1 through stage 4 chronic kidney disease, or unspecified chronic kidney disease: Secondary | ICD-10-CM | POA: Insufficient documentation

## 2021-06-07 LAB — CBC WITH DIFFERENTIAL/PLATELET
Abs Immature Granulocytes: 0.01 10*3/uL (ref 0.00–0.07)
Basophils Absolute: 0 10*3/uL (ref 0.0–0.1)
Basophils Relative: 1 %
Eosinophils Absolute: 0.2 10*3/uL (ref 0.0–0.5)
Eosinophils Relative: 5 %
HCT: 36.6 % (ref 36.0–46.0)
Hemoglobin: 12.4 g/dL (ref 12.0–15.0)
Immature Granulocytes: 0 %
Lymphocytes Relative: 35 %
Lymphs Abs: 1.7 10*3/uL (ref 0.7–4.0)
MCH: 33.9 pg (ref 26.0–34.0)
MCHC: 33.9 g/dL (ref 30.0–36.0)
MCV: 100 fL (ref 80.0–100.0)
Monocytes Absolute: 0.5 10*3/uL (ref 0.1–1.0)
Monocytes Relative: 11 %
Neutro Abs: 2.4 10*3/uL (ref 1.7–7.7)
Neutrophils Relative %: 48 %
Platelets: 102 10*3/uL — ABNORMAL LOW (ref 150–400)
RBC: 3.66 MIL/uL — ABNORMAL LOW (ref 3.87–5.11)
RDW: 13.6 % (ref 11.5–15.5)
WBC: 5 10*3/uL (ref 4.0–10.5)
nRBC: 0 % (ref 0.0–0.2)

## 2021-06-07 LAB — BASIC METABOLIC PANEL
Anion gap: 9 (ref 5–15)
BUN: 52 mg/dL — ABNORMAL HIGH (ref 8–23)
CO2: 20 mmol/L — ABNORMAL LOW (ref 22–32)
Calcium: 9.5 mg/dL (ref 8.9–10.3)
Chloride: 113 mmol/L — ABNORMAL HIGH (ref 98–111)
Creatinine, Ser: 1.2 mg/dL — ABNORMAL HIGH (ref 0.44–1.00)
GFR, Estimated: 46 mL/min — ABNORMAL LOW (ref >60)
Glucose, Bld: 124 mg/dL — ABNORMAL HIGH (ref 70–99)
Potassium: 5.3 mmol/L — ABNORMAL HIGH (ref 3.5–5.1)
Sodium: 142 mmol/L (ref 135–145)

## 2021-06-07 MED ORDER — LACTATED RINGERS IV BOLUS: 1000.0000 mL | Freq: Once | INTRAVENOUS | Status: DC

## 2021-06-07 NOTE — ED Notes (Signed)
Pt has not complaints.  Sts she was sent to the ED d/t critical lab.

## 2021-06-07 NOTE — ED Provider Notes (Signed)
[facility] DEPT Provider Note   CSN: [id number] Arrival date & time: 06/07/21  1041     History Chief Complaint  Patient presents with   abnormal labs    Sara Graves is a 80 y.o. female.  HPI  80 year old female with a history of CHF on spironolactone and torsemide, biventricular ICD in place, CAD, GERD, HTN, HLD presenting to the emergency department with concern for hyperkalemia 4 days ago on outpatient laboratory visit.  Patient states that she was sent by her cardiologist Dr. Irven Shelling office due to a potassium reading of 6.1 on 06/03/2021.  The patient was advised to hold her home spironolactone and continue taking her home Lasix which she has since done.  She denies any chest pain, shortness of breath, heart palpitations.  She presents to the emergency department for repeat laboratory evaluation.  On arrival, the patient had no other complaints.  Past Medical History:  Diagnosis Date   Acute combined systolic and diastolic heart failure ([place]) 04/25/2018   Anxiety    Arthritis    "hands" (12/29/2017)   Basal cell carcinoma of nose    removed   Bursitis of left shoulder    BV ICD Medtronic Claria MRI CRTD 06/21/2019   Cat scratch fever    Late 90s   Chronic combined systolic and diastolic CHF (congestive heart failure) ([place]) 06/08/2019   Coronary artery disease    Depression    Diabetes mellitus, type II, insulin dependent ([place])    Encounter for assessment of implantable cardioverter-defibrillator (ICD) 09/28/2019   GERD (gastroesophageal reflux disease)    H. pylori infection 2008 and 1998   treated   Hyperlipidemia    Hypertension    Hypothyroidism    Low oxygen saturation    Multinodular goiter    Rotator cuff tear, left recurrent    Urge urinary incontinence    UTI (lower urinary tract infection) 05/2016    Patient Active Problem List   Diagnosis Date Noted   Severe nonproliferative diabetic retinopathy of right eye, with macular  edema, associated with type 2 diabetes mellitus ([place]) 05/13/2021   Severe nonproliferative diabetic retinopathy of left eye, with macular edema, associated with type 2 diabetes mellitus ([place]) 05/13/2021   Vitreomacular adhesion of both eyes 05/13/2021   Cataract, nuclear sclerotic, both eyes 05/13/2021   CKD (chronic kidney disease), stage II 02/14/2021   Left upper extremity swelling    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)    Dependence on continuous supplemental oxygen    Acute on chronic combined systolic and diastolic CHF (congestive heart failure) ([place]) 02/12/2021   Wound cellulitis 01/09/2021   Encounter for assessment of implantable cardioverter-defibrillator (ICD) 09/28/2019   Ischemic cardiomyopathy 09/28/2019   Acute on chronic HFrEF (heart failure with reduced ejection fraction) ([place]) 06/21/2019   ICD Medtronic Claria MRI CRTD 06/21/2019 06/21/2019   Nonischemic cardiomyopathy ([place]) 06/17/2019   Left bundle branch block 06/17/2019   Chronic combined systolic and diastolic CHF (congestive heart failure) ([place]) 06/08/2019   Chronic kidney disease (CKD), stage III (moderate) ([place]) 05/12/2019   Acute respiratory failure with hypoxia ([place]) 01/20/2019   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 ([place]) 06/14/2018   Anxiety and depression 01/21/2018   Coronary artery disease involving native coronary artery of native heart without angina pectoris 01/21/2018   Gastroesophageal reflux disease without esophagitis 01/21/2018   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   Shortness of breath 12/27/2017   Essential  hypertension 03/24/2015   Type 2 diabetes mellitus ([place]) 01/10/2015   Hyperlipidemia 10/24/2014   Thrombocytopenia ([place]) 08/31/2014   Chronic back pain 05/30/2014   Skin cancer, basal cell 05/30/2014   Protein-calorie malnutrition, severe ([place]) 05/16/2014   Unspecified vitamin D deficiency 05/09/2014   Osteopenia 04/06/2014    Neuropathy of leg 04/27/2013   Weight loss, abnormal 03/16/2013   Left rotator cuff tear 12/29/2012   Baker's cyst 04/09/2012   UNSPECIFIED VENOUS INSUFFICIENCY 09/20/2010   Depression 02/04/2007   Major depressive disorder, single episode 02/04/2007   Hypothyroidism 01/14/2007   DM (diabetes mellitus), type 2, uncontrolled ([place]) 01/14/2007   HYPERCHOLESTEROLEMIA 01/14/2007   HYPERTENSION, BENIGN SYSTEMIC 01/14/2007   RHINITIS, ALLERGIC 01/14/2007   ARTHRITIS 01/14/2007   SCIATICA 01/14/2007    Past Surgical History:  Procedure Laterality Date   APPENDECTOMY  AGE 40   BACK SURGERY     BASAL CELL CARCINOMA EXCISION     "nose"   BIV ICD INSERTION CRT-D N/A 06/21/2019   Procedure: BIV ICD INSERTION CRT-D;  Surgeon: Evans Lance, MD;  Location: [facility];  Service: Cardiovascular;  Laterality: N/A;   BLADDER NECK SUSPENSION  1970's   BUNIONECTOMY WITH HAMMERTOE RECONSTRUCTION Bilateral    CARPAL TUNNEL RELEASE Right 05/2017   COLONOSCOPY W/ POLYPECTOMY     CORONARY ANGIOPLASTY WITH STENT PLACEMENT  12/29/2017   CORONARY BALLOON ANGIOPLASTY N/A 02/08/2019   Procedure: CORONARY BALLOON ANGIOPLASTY;  Surgeon: Adrian Prows, MD;  Location: [facility];  Service: Cardiovascular;  Laterality: N/A;   CORONARY STENT INTERVENTION N/A 12/29/2017   Procedure: CORONARY STENT INTERVENTION;  Surgeon: Nigel Mormon, MD;  Location: [facility];  Service: Cardiovascular;  Laterality: N/A;   CORONARY STENT INTERVENTION N/A 04/26/2018   Procedure: CORONARY STENT INTERVENTION;  Surgeon: Nigel Mormon, MD;  Location: [facility];  Service: Cardiovascular;  Laterality: N/A;   CORONARY STENT INTERVENTION N/A 02/08/2019   Procedure: CORONARY STENT INTERVENTION;  Surgeon: Adrian Prows, MD;  Location: [facility];  Service: Cardiovascular;  Laterality: N/A;   ESOPHAGOGASTRODUODENOSCOPY ENDOSCOPY     FORAMINAL DECOMPRESSION AT L2 TO THE SACRUM  01-05-2008   L2  -   S1   GANGLION CYST EXCISION Left 01/17/2009   ring finger   IMPLANTATION PERMANENT SPINAL CORD STIMULATOR  06-15-2008   JUNE 2013--  BATTERY CHANGE   JOINT REPLACEMENT     LEFT HEART CATH AND CORONARY ANGIOGRAPHY N/A 04/26/2018   Procedure: LEFT HEART CATH AND CORONARY ANGIOGRAPHY;  Surgeon: Nigel Mormon, MD;  Location: [facility];  Service: Cardiovascular;  Laterality: N/A;   LEFT HEART CATH AND CORONARY ANGIOGRAPHY N/A 02/08/2019   Procedure: LEFT HEART CATH AND CORONARY ANGIOGRAPHY;  Surgeon: Adrian Prows, MD;  Location: [facility];  Service: Cardiovascular;  Laterality: N/A;   LIPOMA EXCISION Right    RIGHT ELBOW   METATARSAL HEAD EXCISION Right 06/16/2018   Procedure: right 5th metatarsal excision, a mini c-arm;  Surgeon: Trula Slade, DPM;  Location: [facility];  Service: Podiatry;  Laterality: Right;  anesthesia can do block   RIGHT/LEFT HEART CATH AND CORONARY ANGIOGRAPHY N/A 12/29/2017   Procedure: RIGHT/LEFT HEART CATH AND CORONARY ANGIOGRAPHY;  Surgeon: Nigel Mormon, MD;  Location: [facility];  Service: Cardiovascular;  Laterality: N/A;   SHOULDER OPEN ROTATOR CUFF REPAIR  09/07/2012   Procedure: ROTATOR CUFF REPAIR SHOULDER OPEN;  Surgeon: Magnus Sinning, MD;  Location: [facility];  Service: Orthopedics;  Laterality: Left;  OPEN ANTERIOR ACROMIONECTOMY AND ROTATOR CUFF REPAIR ON LEFT    SHOULDER OPEN ROTATOR CUFF REPAIR Left 12/28/2012   Procedure: ROTATOR CUFF REPAIR SHOULDER OPEN;  Surgeon: Magnus Sinning, MD;  Location: [facility];  Service: Orthopedics;  Laterality: Left;  OPEN SHOULDER ROTATOR CUFF REPAIR ON LEFT  WITH ANTERIOR ACROMINECTOMY    SHOULDER OPEN ROTATOR CUFF REPAIR Right 03/28/2003   RIGHT SHOULDER  DEGENERATIVE AC JOINT AND RC TEAR   SPINAL CORD STIMULATOR BATTERY EXCHANGE N/A 07/03/2016   Procedure: SPINAL CORD STIMULATOR BATTERY PLACMENT;  Surgeon: Melina Schools, MD;  Location: [facility];   Service: Orthopedics;  Laterality: N/A;   TONSILLECTOMY  AGE 63   TOTAL KNEE ARTHROPLASTY Right 05/06/2000   OA RIGHT KNEE   VAGINAL HYSTERECTOMY  1970's     OB History   No obstetric history on file.     Family History  Problem Relation Age of Onset   Cancer Mother        breast   Heart Problems Mother    Prostate cancer Father    Muscular dystrophy Son    Lung cancer Son    Cancer Maternal Grandmother        breast   Heart disease Maternal Grandfather    Breast cancer Sister     Social History   Tobacco Use   Smoking status: Never   Smokeless tobacco: Never  Vaping Use   Vaping Use: Never used  Substance Use Topics   Alcohol use: No   Drug use: No    Home Medications Prior to Admission medications   Medication Sig Start Date End Date Taking? Authorizing Provider  alendronate (FOSAMAX) 70 MG tablet Take 1 tablet (70 mg total) by mouth once a week. 02/15/21   Bonnell Public, MD  ALPRAZolam Duanne Moron) 0.25 MG tablet Take 1 tablet (0.25 mg total) by mouth as directed. 1/2 tablet r twice daily or one tab at night for anxiety 02/07/20   Adrian Prows, MD  aspirin EC 81 MG tablet Take 81 mg by mouth daily.    [provider]  busPIRone (BUSPAR) 5 MG tablet Take 5 mg by mouth 2 (two) times daily. As needed    [provider]  Calcium Carb-Cholecalciferol (CALCIUM 600 + D PO) Take 1 tablet by mouth daily.     [provider]  dapagliflozin propanediol (FARXIGA) 10 MG TABS tablet Take 1 tablet (10 mg total) by mouth daily. 02/16/21   Dana Allan I, MD  diclofenac Sodium (VOLTAREN) 1 % GEL APPLY 2 G TOPICALLY FOUR TIMES DAILY. 02/15/21 02/15/22  Dana Allan I, MD  ENTRESTO 97-103 MG TAKE 1 TABLET BY MOUTH TWICE A DAY 05/21/21   Tolia, Sunit, DO  ezetimibe (ZETIA) 10 MG tablet Take 10 mg by mouth daily.    [provider]  FLUoxetine (PROZAC) 10 MG capsule Take 10 mg by mouth daily.    [provider]  furosemide (LASIX) 40 MG  tablet TAKE 1 TABLET (40 MG TOTAL) BY MOUTH DAILY. Patient taking differently: Take by mouth daily as needed for fluid or edema. 03/25/21 03/25/22  Tolia, Sunit, DO  gabapentin (NEURONTIN) 100 MG capsule Take 100 mg by mouth at bedtime.    [provider]  glucose blood test strip 1 each 3 (three) times daily.  12/31/17   [provider]  insulin aspart (NOVOLOG FLEXPEN) 100 UNIT/ML FlexPen Inject into the skin. 02/21/21   [provider]  insulin degludec (TRESIBA) 100 UNIT/ML FlexTouch Pen Inject into the skin. 02/21/21   [provider]  isosorbide dinitrate (ISORDIL) 30 MG tablet TAKE 1 TABLET BY MOUTH 3 TIMES DAILY. 05/17/21   Tolia, Sunit, DO  levothyroxine (SYNTHROID, LEVOTHROID) 50 MCG tablet Take 50 mcg by mouth daily before breakfast.  08/24/12   Clovis Cao, MD  Magnesium Oxide 500 MG TABS TAKE 1 TABLET (500 MG TOTAL) BY MOUTH IN THE MORNING AND AT BEDTIME. Patient taking differently: Take 500 mg by mouth in the morning, at noon, and at bedtime. 06/04/20   Tolia, Sunit, DO  metoprolol succinate (TOPROL-XL) 50 MG 24 hr tablet Take 1 tablet (50 mg total) by mouth daily. Take with or immediately following a meal. 05/24/21 08/22/21  Tolia, Sunit, DO  nitroGLYCERIN (NITROSTAT) 0.4 MG SL tablet PLACE 1 TABLET UNDER THE TONGUE EVERY 5 MINUTES X 3 DOSES AS NEEDED FOR CHEST PAIN. Patient taking differently: Place 0.4 mg under the tongue every 5 (five) minutes as needed for chest pain. 05/24/20   Tolia, Sunit, DO  ONETOUCH VERIO test strip 1 each by Other route 3 (three) times daily. 06/13/18   [provider]  rosuvastatin (CRESTOR) 40 MG tablet Take 1 tablet (40 mg total) by mouth at bedtime. 03/28/20 05/24/21  Tolia, Sunit, DO  spironolactone (ALDACTONE) 25 MG tablet TAKE 0.5 TABLETS (12.5 MG TOTAL) BY MOUTH IN THE MORNING FOR 180 DOSES. 06/06/21   Tolia, Sunit, DO  vitamin B-12 (CYANOCOBALAMIN) 1000 MCG tablet Take 1,000 mcg by mouth daily.  07/28/18   [provider]  Vitamin D, Ergocalciferol, (DRISDOL) 50000 units CAPS capsule Take 50,000 Units by mouth every 7 (seven) days. Take on Thursdays    [provider]    Allergies    Rosiglitazone maleate, Morphine, Cephalexin, Sulfa antibiotics, Tramadol, Elavil [amitriptyline], and Tramadol-acetaminophen  Review of Systems   Review of Systems  Constitutional:  Negative for chills and fever.  HENT:  Negative for ear pain and sore throat.   Eyes:  Negative for pain and visual disturbance.  Respiratory:  Negative for cough and shortness of breath.   Cardiovascular:  Negative for chest pain and palpitations.  Gastrointestinal:  Negative for abdominal pain and vomiting.  Genitourinary:  Negative for dysuria and hematuria.  Musculoskeletal:  Negative for arthralgias and back pain.  Skin:  Negative for color change and rash.  Neurological:  Positive for weakness. Negative for seizures and syncope.  All other systems reviewed and are negative.  Physical Exam Updated Vital Signs BP (!) 176/93 (BP Location: Left Arm)   Pulse (!) 50   Temp (!) 97.4 F (36.3 C) (Oral)   Resp 15   Ht 5\' 2"  (1.575 m)   Wt 81.6 kg   SpO2 100%   BMI 32.92 kg/m   Physical Exam Vitals and nursing note reviewed.  Constitutional:      General: She is not in acute distress.    Appearance: She is well-developed. She is not ill-appearing.  HENT:     Head: Normocephalic and atraumatic.  Eyes:     Conjunctiva/sclera: Conjunctivae normal.  Cardiovascular:     Rate and Rhythm: Normal rate and regular rhythm.     Heart sounds: No murmur heard. Pulmonary:     Effort: Pulmonary effort is normal. No respiratory distress.     Breath sounds: Normal breath sounds.  Abdominal:     Palpations: Abdomen is soft.     Tenderness: There is no abdominal tenderness.  Musculoskeletal:     Cervical back: Neck supple.  Skin:    General: Skin is warm and dry.  Neurological:     Mental Status: She is alert.    ED  Results / Procedures / Treatments   Labs (all labs ordered are listed, but only abnormal results are displayed) Labs Reviewed  BASIC METABOLIC PANEL - Abnormal; Notable for the following components:      Result Value   Potassium 5.3 (*)    Chloride 113 (*)    CO2 20 (*)    Glucose, Bld 124 (*)    BUN 52 (*)    Creatinine, Ser 1.20 (*)    GFR, Estimated 46 (*)    All other components within normal limits  CBC WITH DIFFERENTIAL/PLATELET - Abnormal; Notable for the following components:   RBC 3.66 (*)    Platelets 102 (*)    All other components within normal limits    EKG EKG Interpretation  Date/Time:  Friday June 07 2021 11:35:39 EDT Ventricular Rate:  50 PR Interval:  137 QRS Duration: 153 QT Interval:  540 QTC Calculation: 493 R Axis:   47 Text Interpretation: ATRIAL PACED RHYTHM Nonspecific intraventricular conduction delay Repol abnrm suggests ischemia, anterolateral 12 Lead; [place]ar Confirmed by Aletta Edouard [phone]) on 06/07/2021 11:42:50 AM  Radiology No results found.  Procedures Procedures   Medications Ordered in ED Medications - No data to display  ED Course  I have reviewed the triage vital signs and the nursing notes.  Pertinent labs & imaging results that were available during my care of the patient were reviewed by me and considered in my medical decision making (see chart for details).    MDM Rules/Calculators/A&P                           80 year old female with medical history as above presenting to the Emergency Department for evaluation following outpatient laboratory draw concerning for hyperkalemia to 6.1.  On arrival, the patient had no EKG changes concerning for hyperkalemia.  No peaked T waves or QRS widening.  BMP was collected and revealed improvement in the patient's potassium to 5.3.  I spoke with [name] over the phone regarding the patient who advised that the patient continue to hold her home spironolactone, continue taking her  home torsemide and to follow-up in clinic for further assessment and continued care.  The patient was informed of her laboratory results and EKG findings.  She was advised to continue to hold her home spironolactone, continue taking her home torsemide.  She has not taken her torsemide yet today and plans to take it orally when she gets home.  Overall stable for discharge. Final Clinical Impression(s) / ED Diagnoses Final diagnoses:  Hyperkalemia    Rx / DC Orders ED Discharge Orders     None        Regan Lemming, MD 06/07/21 1408

## 2021-06-07 NOTE — ED Triage Notes (Signed)
Patient had labs drawn on 06/03/21 and K+ was 6.1. Patient was called and told to come to the ED.

## 2021-06-07 NOTE — Discharge Instructions (Addendum)
I spoke with Dr. [name] regarding your medications. Continue to hold your spironolactone for now and continue to take your Lasix as prescribed.

## 2021-06-07 NOTE — ED Provider Notes (Signed)
Emergency Medicine Provider Triage Evaluation Note  Sara Graves , a 80 y.o. female  was evaluated in triage.  Pt complains of elevated potasium.  Review of Systems  Positive: weakness Negative: Chest pain, abd pain  Physical Exam  BP (!) 171/62 (BP Location: Left Arm)   Pulse (!) 50   Temp (!) 97.4 F (36.3 C) (Oral)   Resp 20   Ht 5\' 2"  (1.575 m)   Wt 81.6 kg   SpO2 99%   BMI 32.92 kg/m  Gen:   Awake, no distress   Resp:  Normal effort  MSK:   Moves extremities without difficulty  Other:    Medical Decision Making  Medically screening exam initiated at 11:30 AM.  Appropriate orders placed.  Tito Dine was informed that the remainder of the evaluation will be completed by another provider, this initial triage assessment does not replace that evaluation, and the importance of remaining in the ED until their evaluation is complete.  Patient is currently on diuretic who had blood work done 5 days ago at an outside facility but was notified today that her potassium level was extremely high and will need to be evaluated.  No recent medication changes.  Aside from some mild generalized weakness no other complaint.   Domenic Moras, PA-C 06/07/21 1141    Hayden Rasmussen, MD 06/07/21 [phone]

## 2021-06-07 NOTE — ED Notes (Signed)
Called Pt's husband per request to come and get her.

## 2021-06-07 NOTE — Progress Notes (Signed)
Spoke to patient she voiced understanding she is going to [facility].

## 2021-06-07 NOTE — ED Notes (Signed)
Verbalized understanding discharge instructions, medication changes, and follow-up. In no acute distress.

## 2021-06-11 ENCOUNTER — Encounter: Payer: Self-pay | Admitting: Cardiology

## 2021-06-20 ENCOUNTER — Other Ambulatory Visit: Payer: Self-pay | Admitting: Cardiology

## 2021-06-20 DIAGNOSIS — I5042 Chronic combined systolic (congestive) and diastolic (congestive) heart failure: Secondary | ICD-10-CM

## 2021-06-20 DIAGNOSIS — Z95810 Presence of automatic (implantable) cardiac defibrillator: Secondary | ICD-10-CM

## 2021-06-20 DIAGNOSIS — E875 Hyperkalemia: Secondary | ICD-10-CM

## 2021-06-20 DIAGNOSIS — I255 Ischemic cardiomyopathy: Secondary | ICD-10-CM

## 2021-06-20 MED ORDER — LOKELMA 10 G PO PACK: 10.0000 g | PACK | Freq: Every day | ORAL | 0 refills | Status: DC

## 2021-06-20 NOTE — Progress Notes (Signed)
Patient recently went to her PCPs office earlier today and had labs which noted hyperkalemia.  The office was called to reevaluate her medications and recommended medication changes to help prevent hyperkalemia.  Records available in Care Everywhere. 06/20/2021: Sodium 143, potassium 5.3, chloride 112, bicarb 17, BUN 53, creatinine 1.28.  Patient is currently not on spironolactone.  No potassium supplements over-the-counter.  Shared decision was to start her on [place] with repeat blood work in 1 week.    ICD-10-CM   1. Hyperkalemia  E87.5 sodium zirconium cyclosilicate (LOKELMA) 10 g PACK packet    Basic metabolic panel    Magnesium    2. Chronic combined systolic and diastolic heart failure, NYHA class 2 (HCC)  I50.42     3. Ischemic cardiomyopathy  I25.5     4. Biventricular ICD (implantable cardioverter-defibrillator) in place  Z95.810        Orders Placed This Encounter  Procedures   Basic metabolic panel    Standing Status:   Future    Standing Expiration Date:   06/20/2022   Magnesium    Standing Status:   Future    Standing Expiration Date:   06/20/2022    Telephone Encounter: 10 minute.   Rex Kras, [place], [facility]  Pager: [phone] Office: [phone]

## 2021-06-25 ENCOUNTER — Encounter (INDEPENDENT_AMBULATORY_CARE_PROVIDER_SITE_OTHER): Payer: Medicare PPO | Admitting: Ophthalmology

## 2021-06-25 LAB — BASIC METABOLIC PANEL
BUN/Creatinine Ratio: 41 — ABNORMAL HIGH (ref 12–28)
BUN: 62 mg/dL — ABNORMAL HIGH (ref 8–27)
CO2: 19 mmol/L — ABNORMAL LOW (ref 20–29)
Calcium: 9.8 mg/dL (ref 8.7–10.3)
Chloride: 107 mmol/L — ABNORMAL HIGH (ref 96–106)
Creatinine, Ser: 1.51 mg/dL — ABNORMAL HIGH (ref 0.57–1.00)
Glucose: 95 mg/dL (ref 65–99)
Potassium: 4.2 mmol/L (ref 3.5–5.2)
Sodium: 142 mmol/L (ref 134–144)
eGFR: 35 mL/min/{1.73_m2} — ABNORMAL LOW (ref >59)

## 2021-06-26 ENCOUNTER — Other Ambulatory Visit: Payer: Self-pay | Admitting: Cardiology

## 2021-06-26 ENCOUNTER — Telehealth: Payer: Self-pay | Admitting: Cardiology

## 2021-06-26 ENCOUNTER — Telehealth: Payer: Self-pay

## 2021-06-26 DIAGNOSIS — I5042 Chronic combined systolic (congestive) and diastolic (congestive) heart failure: Secondary | ICD-10-CM

## 2021-06-26 NOTE — Telephone Encounter (Signed)
Spoke to patient message has been forward to patient's provider

## 2021-06-26 NOTE — Telephone Encounter (Signed)
Pt inquiring about results (unsure if it is for an EKG or Echo)

## 2021-06-26 NOTE — Progress Notes (Signed)
Labs from 06/07/2021 noted hyperkalemia despite discontinuation of spironolactone.  Subsequently started on [facility] and repeat blood work on 06/24/2021 notes resolution of hyperkalemia but acute kidney injury.  Patient states that her water intake is appropriate, she does not have any symptoms to suggest congestive heart failure.  Weight is stable.  Recommended discontinuation of Lokelma for now. Decrease Entresto to 97/103 mg HALF A TAB twice a day. Repeat BMP and magnesium in 1 week.

## 2021-06-26 NOTE — Telephone Encounter (Signed)
Pt clarified, the test is regarding potassium levels.

## 2021-06-27 NOTE — Telephone Encounter (Signed)
Spoke to her yesterday. See EMR

## 2021-06-28 LAB — MAGNESIUM: Magnesium: 2.2 mg/dL (ref 1.6–2.3)

## 2021-07-02 ENCOUNTER — Encounter (INDEPENDENT_AMBULATORY_CARE_PROVIDER_SITE_OTHER): Payer: Medicare PPO | Admitting: Ophthalmology

## 2021-07-03 ENCOUNTER — Encounter (INDEPENDENT_AMBULATORY_CARE_PROVIDER_SITE_OTHER): Payer: Self-pay | Admitting: Ophthalmology

## 2021-07-03 ENCOUNTER — Ambulatory Visit (INDEPENDENT_AMBULATORY_CARE_PROVIDER_SITE_OTHER): Payer: Medicare PPO | Admitting: Ophthalmology

## 2021-07-03 ENCOUNTER — Other Ambulatory Visit: Payer: Self-pay

## 2021-07-03 ENCOUNTER — Encounter (INDEPENDENT_AMBULATORY_CARE_PROVIDER_SITE_OTHER): Payer: Medicare PPO | Admitting: Ophthalmology

## 2021-07-03 DIAGNOSIS — H2513 Age-related nuclear cataract, bilateral: Secondary | ICD-10-CM | POA: Diagnosis not present

## 2021-07-03 DIAGNOSIS — E113412 Type 2 diabetes mellitus with severe nonproliferative diabetic retinopathy with macular edema, left eye: Secondary | ICD-10-CM | POA: Diagnosis not present

## 2021-07-03 DIAGNOSIS — E113413 Type 2 diabetes mellitus with severe nonproliferative diabetic retinopathy with macular edema, bilateral: Secondary | ICD-10-CM

## 2021-07-03 DIAGNOSIS — E113411 Type 2 diabetes mellitus with severe nonproliferative diabetic retinopathy with macular edema, right eye: Secondary | ICD-10-CM

## 2021-07-03 MED ORDER — BEVACIZUMAB 2.5 MG/0.1ML IZ SOSY
2.5000 mg | PREFILLED_SYRINGE | INTRAVITREAL | Status: AC | PRN
  Administered 2021-07-03: 2.5 mg via INTRAVITREAL

## 2021-07-03 NOTE — Assessment & Plan Note (Signed)
Cataract surgery completed OU within the last month Dr. Darlina Guys [facility]

## 2021-07-03 NOTE — Progress Notes (Signed)
07/03/2021     CHIEF COMPLAINT Patient presents for Retina Follow Up   HISTORY OF PRESENT ILLNESS: Sara Graves is a 80 y.o. female who presents to the clinic today for:   HPI     Retina Follow Up           Diagnosis: Diabetic Retinopathy   Laterality: left eye   Onset: 6 weeks ago   Severity: mild   Duration: 6 weeks   Course: stable         Comments   6 week fu OS and Avastin OS Pt states,"I had catartact surgery on my right eye on this past Friday. I had the Toric implant. I had a post op this morning with Dr. Katy Fitch and he said that I was doing great." A1C:unkown LBS: 111 Pt reports that she is still using "Drops from surgery " in right eye. "Pink top and Pearline Cables top"      Last edited by Kendra Opitz, COA on 07/03/2021  8:59 AM.      Referring physician: Debbra Riding, MD [address] [place],  [place] [address]  HISTORICAL INFORMATION:   Selected notes from the MEDICAL RECORD NUMBER    Lab Results  Component Value Date   HGBA1C 9.8 (H) 02/12/2021     CURRENT MEDICATIONS: No current outpatient medications on file. (Ophthalmic Drugs)   No current facility-administered medications for this visit. (Ophthalmic Drugs)   Current Outpatient Medications (Other)  Medication Sig   alendronate (FOSAMAX) 70 MG tablet Take 1 tablet (70 mg total) by mouth once a week.   ALPRAZolam (XANAX) 0.25 MG tablet Take 1 tablet (0.25 mg total) by mouth as directed. 1/2 tablet r twice daily or one tab at night for anxiety   aspirin EC 81 MG tablet Take 81 mg by mouth daily.   busPIRone (BUSPAR) 5 MG tablet Take 5 mg by mouth 2 (two) times daily. As needed   Calcium Carb-Cholecalciferol (CALCIUM 600 + D PO) Take 1 tablet by mouth daily.    dapagliflozin propanediol (FARXIGA) 10 MG TABS tablet Take 1 tablet (10 mg total) by mouth daily.   diclofenac Sodium (VOLTAREN) 1 % GEL APPLY 2 G TOPICALLY FOUR TIMES DAILY.   ENTRESTO 97-103 MG TAKE 1 TABLET BY MOUTH TWICE A  DAY   ezetimibe (ZETIA) 10 MG tablet Take 10 mg by mouth daily.   FLUoxetine (PROZAC) 10 MG capsule Take 10 mg by mouth daily.   furosemide (LASIX) 40 MG tablet TAKE 1 TABLET (40 MG TOTAL) BY MOUTH DAILY. (Patient taking differently: Take by mouth daily as needed for fluid or edema.)   gabapentin (NEURONTIN) 100 MG capsule Take 100 mg by mouth at bedtime.   glucose blood test strip 1 each 3 (three) times daily.    insulin aspart (NOVOLOG FLEXPEN) 100 UNIT/ML FlexPen Inject into the skin.   insulin degludec (TRESIBA) 100 UNIT/ML FlexTouch Pen Inject into the skin.   isosorbide dinitrate (ISORDIL) 30 MG tablet TAKE 1 TABLET BY MOUTH 3 TIMES DAILY.   levothyroxine (SYNTHROID, LEVOTHROID) 50 MCG tablet Take 50 mcg by mouth daily before breakfast.    Magnesium Oxide 500 MG TABS TAKE 1 TABLET (500 MG TOTAL) BY MOUTH IN THE MORNING AND AT BEDTIME. (Patient taking differently: Take 500 mg by mouth in the morning, at noon, and at bedtime.)   metoprolol succinate (TOPROL-XL) 50 MG 24 hr tablet Take 1 tablet (50 mg total) by mouth daily. Take with or immediately  following a meal.   nitroGLYCERIN (NITROSTAT) 0.4 MG SL tablet PLACE 1 TABLET UNDER THE TONGUE EVERY 5 MINUTES X 3 DOSES AS NEEDED FOR CHEST PAIN. (Patient taking differently: Place 0.4 mg under the tongue every 5 (five) minutes as needed for chest pain.)   ONETOUCH VERIO test strip 1 each by Other route 3 (three) times daily.   rosuvastatin (CRESTOR) 40 MG tablet Take 1 tablet (40 mg total) by mouth at bedtime.   vitamin B-12 (CYANOCOBALAMIN) 1000 MCG tablet Take 1,000 mcg by mouth daily.    Vitamin D, Ergocalciferol, (DRISDOL) 50000 units CAPS capsule Take 50,000 Units by mouth every 7 (seven) days. Take on Thursdays   No current facility-administered medications for this visit. (Other)      REVIEW OF SYSTEMS:    ALLERGIES Allergies  Allergen Reactions   Rosiglitazone Maleate Anaphylaxis and Swelling   Morphine Other (See Comments)     SEVERE HYPOTENSION    Cephalexin Diarrhea   Sulfa Antibiotics Diarrhea and Nausea And Vomiting   Tramadol Other (See Comments)    Unknown reaction   Elavil [Amitriptyline] Nausea Only   Tramadol-Acetaminophen Rash    PAST MEDICAL HISTORY Past Medical History:  Diagnosis Date   Acute combined systolic and diastolic heart failure ([place]) 04/25/2018   Anxiety    Arthritis    "hands" (12/29/2017)   Basal cell carcinoma of nose    removed   Bursitis of left shoulder    BV ICD Medtronic [place] MRI CRTD 06/21/2019   Cat scratch fever    Late 90s   Chronic combined systolic and diastolic CHF (congestive heart failure) ([place]) 06/08/2019   Coronary artery disease    Depression    Diabetes mellitus, type II, insulin dependent ([place])    Encounter for assessment of implantable cardioverter-defibrillator (ICD) 09/28/2019   GERD (gastroesophageal reflux disease)    H. pylori infection 2008 and 1998   treated   Hyperlipidemia    Hypertension    Hypothyroidism    Low oxygen saturation    Multinodular goiter    Rotator cuff tear, left recurrent    Urge urinary incontinence    UTI (lower urinary tract infection) 05/2016   Past Surgical History:  Procedure Laterality Date   APPENDECTOMY  AGE 50   BACK SURGERY     BASAL CELL CARCINOMA EXCISION     "nose"   BIV ICD INSERTION CRT-D N/A 06/21/2019   Procedure: BIV ICD INSERTION CRT-D;  Surgeon: Evans Lance, MD;  Location: [facility];  Service: Cardiovascular;  Laterality: N/A;   BLADDER NECK SUSPENSION  1970's   BUNIONECTOMY WITH HAMMERTOE RECONSTRUCTION Bilateral    CARPAL TUNNEL RELEASE Right 05/2017   COLONOSCOPY W/ POLYPECTOMY     CORONARY ANGIOPLASTY WITH STENT PLACEMENT  12/29/2017   CORONARY BALLOON ANGIOPLASTY N/A 02/08/2019   Procedure: CORONARY BALLOON ANGIOPLASTY;  Surgeon: Adrian Prows, MD;  Location: [facility];  Service: Cardiovascular;  Laterality: N/A;   CORONARY STENT INTERVENTION N/A 12/29/2017   Procedure:  CORONARY STENT INTERVENTION;  Surgeon: Nigel Mormon, MD;  Location: [facility];  Service: Cardiovascular;  Laterality: N/A;   CORONARY STENT INTERVENTION N/A 04/26/2018   Procedure: CORONARY STENT INTERVENTION;  Surgeon: Nigel Mormon, MD;  Location: [facility];  Service: Cardiovascular;  Laterality: N/A;   CORONARY STENT INTERVENTION N/A 02/08/2019   Procedure: CORONARY STENT INTERVENTION;  Surgeon: Adrian Prows, MD;  Location: [facility];  Service: Cardiovascular;  Laterality: N/A;  ESOPHAGOGASTRODUODENOSCOPY ENDOSCOPY     FORAMINAL DECOMPRESSION AT L2 TO THE SACRUM  01-05-2008   L2  -  S1   GANGLION CYST EXCISION Left 01/17/2009   ring finger   IMPLANTATION PERMANENT SPINAL CORD STIMULATOR  06-15-2008   JUNE 2013--  BATTERY CHANGE   JOINT REPLACEMENT     LEFT HEART CATH AND CORONARY ANGIOGRAPHY N/A 04/26/2018   Procedure: LEFT HEART CATH AND CORONARY ANGIOGRAPHY;  Surgeon: Nigel Mormon, MD;  Location: [facility];  Service: Cardiovascular;  Laterality: N/A;   LEFT HEART CATH AND CORONARY ANGIOGRAPHY N/A 02/08/2019   Procedure: LEFT HEART CATH AND CORONARY ANGIOGRAPHY;  Surgeon: Adrian Prows, MD;  Location: [facility];  Service: Cardiovascular;  Laterality: N/A;   LIPOMA EXCISION Right    RIGHT ELBOW   METATARSAL HEAD EXCISION Right 06/16/2018   Procedure: right 5th metatarsal excision, a mini c-arm;  Surgeon: Trula Slade, DPM;  Location: [facility];  Service: Podiatry;  Laterality: Right;  anesthesia can do block   RIGHT/LEFT HEART CATH AND CORONARY ANGIOGRAPHY N/A 12/29/2017   Procedure: RIGHT/LEFT HEART CATH AND CORONARY ANGIOGRAPHY;  Surgeon: Nigel Mormon, MD;  Location: [facility];  Service: Cardiovascular;  Laterality: N/A;   SHOULDER OPEN ROTATOR CUFF REPAIR  09/07/2012   Procedure: ROTATOR CUFF REPAIR SHOULDER OPEN;  Surgeon: Magnus Sinning, MD;  Location: [facility];  Service: Orthopedics;   Laterality: Left;  OPEN ANTERIOR ACROMIONECTOMY AND ROTATOR CUFF REPAIR ON LEFT    SHOULDER OPEN ROTATOR CUFF REPAIR Left 12/28/2012   Procedure: ROTATOR CUFF REPAIR SHOULDER OPEN;  Surgeon: Magnus Sinning, MD;  Location: [facility];  Service: Orthopedics;  Laterality: Left;  OPEN SHOULDER ROTATOR CUFF REPAIR ON LEFT  WITH ANTERIOR ACROMINECTOMY    SHOULDER OPEN ROTATOR CUFF REPAIR Right 03/28/2003   RIGHT SHOULDER  DEGENERATIVE AC JOINT AND RC TEAR   SPINAL CORD STIMULATOR BATTERY EXCHANGE N/A 07/03/2016   Procedure: SPINAL CORD STIMULATOR BATTERY PLACMENT;  Surgeon: Melina Schools, MD;  Location: [facility];  Service: Orthopedics;  Laterality: N/A;   TONSILLECTOMY  AGE 42   TOTAL KNEE ARTHROPLASTY Right 05/06/2000   OA RIGHT KNEE   VAGINAL HYSTERECTOMY  1970's    FAMILY HISTORY Family History  Problem Relation Age of Onset   Cancer Mother        breast   Heart Problems Mother    Prostate cancer Father    Muscular dystrophy Son    Lung cancer Son    Cancer Maternal Grandmother        breast   Heart disease Maternal Grandfather    Breast cancer Sister     SOCIAL HISTORY Social History   Tobacco Use   Smoking status: Never   Smokeless tobacco: Never  Vaping Use   Vaping Use: Never used  Substance Use Topics   Alcohol use: No   Drug use: No         OPHTHALMIC EXAM:  Base Eye Exam     Visual Acuity (ETDRS)       Right Left   Dist [place] 20/70 20/30   Dist ph [place] 20/60 +2 NI         Tonometry (Tonopen, 9:04 AM)       Right Left   Pressure 13 13         Pupils       Pupils Dark Light Shape React APD   Right PERRL 4 3 Round Brisk  None   Left PERRL 4 3 Round Brisk None         Visual Fields (Counting fingers)       Left Right    Full Full         Extraocular Movement       Right Left    Full Full         Neuro/Psych     Oriented x3: Yes         Dilation     Left eye: 1.0% Mydriacyl, 2.5% Phenylephrine @ 9:04 AM            Slit Lamp and Fundus Exam     External Exam       Right Left   External Normal Normal         Slit Lamp Exam       Right Left   Lids/Lashes Normal Normal   Conjunctiva/Sclera White and quiet White and quiet   Cornea Clear Clear   Anterior Chamber Deep and quiet Deep and quiet   Iris Round and reactive Round and reactive   Lens Posterior chamber intraocular lens Posterior chamber intraocular lens   Anterior Vitreous Normal Normal         Fundus Exam       Right Left   Posterior Vitreous Normal Normal   Disc Normal Normal   C/D Ratio 0.3 0.3   Macula Microaneurysms, Macular thickening, Moderate clinically significant macular edema, center involved Microaneurysms, Macular thickening, Moderate clinically significant macular edema, superotemporal to FAZ   Vessels NPDR-Severe NPDR-Severe   Periphery Normal Normal            IMAGING AND PROCEDURES  Imaging and Procedures for 07/03/21  OCT, Retina - OU - Both Eyes       Right Eye Quality was good. Scan locations included subfoveal. Central Foveal Thickness: 382. Progression has no prior data. Findings include abnormal foveal contour, cystoid macular edema, vitreomacular adhesion .   Left Eye Quality was good. Scan locations included subfoveal. Central Foveal Thickness: 352. Progression has no prior data. Findings include abnormal foveal contour, cystoid macular edema, vitreomacular adhesion .   Notes CSME OU, with center involvement OD, now 7 week 2 days post recent Avastin OD improved.  OS increased CSME superotemporally for planned Avastin injection today, at 6-week 1 day postinjection       Intravitreal Injection, Pharmacologic Agent - OS - Left Eye       Time Out 07/03/2021. 9:26 AM. Confirmed correct patient, procedure, site, and patient consented.   Anesthesia Anesthetic medications included [place] 3.5%.   Procedure Preparation included Tobramycin 0.3%, Ofloxacin , 5% betadine to ocular  surface, 10% betadine to eyelids. A 30 gauge needle was used.   Injection: 2.5 mg bevacizumab 2.5 MG/0.1ML   Route: Intravitreal, Site: Left Eye   NDC: [phone], Lot: [id number]   Post-op Post injection exam found visual acuity of at least counting fingers. The patient tolerated the procedure well. There were no complications. The patient received written and verbal post procedure care education. Post injection medications were not given.              ASSESSMENT/PLAN:  Severe nonproliferative diabetic retinopathy of left eye, with macular edema, associated with type 2 diabetes mellitus (HCC) CSME slightly increased superotemporally OS at 6-week postinjection also recent cataract surgery  Severe nonproliferative diabetic retinopathy of right eye, with macular edema, associated with type 2 diabetes mellitus ([place]) OD increased center involved CSME at 7 weeks  postinjection will need repeat injection right eye in the near future  Cataract, nuclear sclerotic, both eyes Cataract surgery completed OU within the last month Dr. Darlina Guys [facility]     ICD-10-CM   1. Severe nonproliferative diabetic retinopathy of left eye, with macular edema, associated with type 2 diabetes mellitus (HCC)  M09.4709 OCT, Retina - OU - Both Eyes    Intravitreal Injection, Pharmacologic Agent - OS - Left Eye    bevacizumab (AVASTIN) SOSY 2.5 mg    2. Severe nonproliferative diabetic retinopathy of right eye, with macular edema, associated with type 2 diabetes mellitus ([place])  E11.3411     3. Cataract, nuclear sclerotic, both eyes  H25.13       1.  CSME threatening center involvement of the macula OS, repeat injection Avastin today and follow-up in 2 weeks to allow for fluorescein angiography OU  2.  CSME OD center involved worse at 7 weeks, will need repeat injection in the near future  3.  OU look great after recent cataract extraction and intraocular lens placement under the direction of Dr.  Katy Fitch  Ophthalmic Meds Ordered this visit:  Meds ordered this encounter  Medications   bevacizumab (AVASTIN) SOSY 2.5 mg       Return in about 2 weeks (around 07/17/2021) for DILATE OU, OPTOS FFA R/L, COLOR FP, AVASTIN OCT, OD.  There are no Patient Instructions on file for this visit.   Explained the diagnoses, plan, and follow up with the patient and they expressed understanding.  Patient expressed understanding of the importance of proper follow up care.   Clent Demark Luismiguel Lamere M.D. Diseases & Surgery of the Retina and Vitreous Retina & Diabetic [place] 07/03/21     Abbreviations: M myopia (nearsighted); A astigmatism; H hyperopia (farsighted); P presbyopia; Mrx spectacle prescription;  CTL contact lenses; OD right eye; OS left eye; OU both eyes  XT exotropia; ET esotropia; PEK punctate epithelial keratitis; PEE punctate epithelial erosions; DES dry eye syndrome; MGD meibomian gland dysfunction; ATs artificial tears; PFAT's preservative free artificial tears; [place] nuclear sclerotic cataract; PSC posterior subcapsular cataract; ERM epi-retinal membrane; PVD posterior vitreous detachment; RD retinal detachment; DM diabetes mellitus; DR diabetic retinopathy; NPDR non-proliferative diabetic retinopathy; PDR proliferative diabetic retinopathy; CSME clinically significant macular edema; DME diabetic macular edema; dbh dot blot hemorrhages; CWS cotton wool spot; POAG primary open angle glaucoma; C/D cup-to-disc ratio; HVF humphrey visual field; GVF goldmann visual field; OCT optical coherence tomography; IOP intraocular pressure; BRVO Branch retinal vein occlusion; CRVO central retinal vein occlusion; CRAO central retinal artery occlusion; BRAO branch retinal artery occlusion; RT retinal tear; SB scleral buckle; PPV pars plana vitrectomy; VH Vitreous hemorrhage; PRP panretinal laser photocoagulation; IVK intravitreal kenalog; VMT vitreomacular traction; MH Macular hole;  NVD neovascularization of the  disc; NVE neovascularization elsewhere; AREDS age related eye disease study; ARMD age related macular degeneration; POAG primary open angle glaucoma; EBMD epithelial/anterior basement membrane dystrophy; ACIOL anterior chamber intraocular lens; IOL intraocular lens; PCIOL posterior chamber intraocular lens; Phaco/IOL phacoemulsification with intraocular lens placement; [place] photorefractive keratectomy; LASIK laser assisted in situ keratomileusis; HTN hypertension; DM diabetes mellitus; COPD chronic obstructive pulmonary disease

## 2021-07-03 NOTE — Assessment & Plan Note (Signed)
OD increased center involved CSME at 7 weeks postinjection will need repeat injection right eye in the near future

## 2021-07-03 NOTE — Assessment & Plan Note (Signed)
CSME slightly increased superotemporally OS at 6-week postinjection also recent cataract surgery

## 2021-07-04 ENCOUNTER — Encounter (INDEPENDENT_AMBULATORY_CARE_PROVIDER_SITE_OTHER): Payer: Self-pay

## 2021-07-06 LAB — BASIC METABOLIC PANEL
BUN/Creatinine Ratio: 43 — ABNORMAL HIGH (ref 12–28)
BUN: 55 mg/dL — ABNORMAL HIGH (ref 8–27)
CO2: 20 mmol/L (ref 20–29)
Calcium: 9.2 mg/dL (ref 8.7–10.3)
Chloride: 108 mmol/L — ABNORMAL HIGH (ref 96–106)
Creatinine, Ser: 1.28 mg/dL — ABNORMAL HIGH (ref 0.57–1.00)
Glucose: 114 mg/dL — ABNORMAL HIGH (ref 65–99)
Potassium: 4.8 mmol/L (ref 3.5–5.2)
Sodium: 143 mmol/L (ref 134–144)
eGFR: 42 mL/min/{1.73_m2} — ABNORMAL LOW (ref >59)

## 2021-07-06 LAB — PRO B NATRIURETIC PEPTIDE: NT-Pro BNP: 2100 pg/mL — ABNORMAL HIGH (ref 0–738)

## 2021-07-06 LAB — MAGNESIUM: Magnesium: 2.2 mg/dL (ref 1.6–2.3)

## 2021-07-08 NOTE — Progress Notes (Signed)
Tried calling patient --no answer

## 2021-07-08 NOTE — Progress Notes (Signed)
Patient returned our call and spoke to another MA she is aware of results

## 2021-07-17 ENCOUNTER — Ambulatory Visit (INDEPENDENT_AMBULATORY_CARE_PROVIDER_SITE_OTHER): Payer: Medicare PPO | Admitting: Ophthalmology

## 2021-07-17 ENCOUNTER — Encounter (INDEPENDENT_AMBULATORY_CARE_PROVIDER_SITE_OTHER): Payer: Self-pay | Admitting: Ophthalmology

## 2021-07-17 ENCOUNTER — Other Ambulatory Visit: Payer: Self-pay

## 2021-07-17 DIAGNOSIS — E113412 Type 2 diabetes mellitus with severe nonproliferative diabetic retinopathy with macular edema, left eye: Secondary | ICD-10-CM | POA: Diagnosis not present

## 2021-07-17 DIAGNOSIS — E113411 Type 2 diabetes mellitus with severe nonproliferative diabetic retinopathy with macular edema, right eye: Secondary | ICD-10-CM

## 2021-07-17 MED ORDER — FLUORESCEIN SODIUM 10 % IV SOLN
500.0000 mg | INTRAVENOUS | Status: AC | PRN
  Administered 2021-07-17: 500 mg via INTRAVENOUS

## 2021-07-17 MED ORDER — BEVACIZUMAB 2.5 MG/0.1ML IZ SOSY
2.5000 mg | PREFILLED_SYRINGE | INTRAVITREAL | Status: AC | PRN
  Administered 2021-07-17: 2.5 mg via INTRAVITREAL

## 2021-07-17 NOTE — Progress Notes (Signed)
07/17/2021     CHIEF COMPLAINT Patient presents for  Chief Complaint  Patient presents with   Retina Follow Up      HISTORY OF PRESENT ILLNESS: Sara Graves is a 80 y.o. female who presents to the clinic today for:   HPI     Retina Follow Up   Patient presents with  Diabetic Retinopathy.  In left eye.  This started 2 weeks ago.  Severity is mild.  Duration of 2 weeks.  Since onset it is stable.        Comments   2 week fu ou oct fp/ffa, r/l, avastin od Patient states vision is stable and unchanged since last visit. Denies any new floaters or FOL. BS: 116 this morning A1C: 6.0 on 07/04/21      Last edited by Laurin Coder, COA on 07/17/2021 10:54 AM.      Referring physician: [name], PA-C [place],  [place] [address]  HISTORICAL INFORMATION:   Selected notes from the MEDICAL RECORD NUMBER    Lab Results  Component Value Date   HGBA1C 9.8 (H) 02/12/2021     CURRENT MEDICATIONS: No current outpatient medications on file. (Ophthalmic Drugs)   No current facility-administered medications for this visit. (Ophthalmic Drugs)   Current Outpatient Medications (Other)  Medication Sig   alendronate (FOSAMAX) 70 MG tablet Take 1 tablet (70 mg total) by mouth once a week.   ALPRAZolam (XANAX) 0.25 MG tablet Take 1 tablet (0.25 mg total) by mouth as directed. 1/2 tablet r twice daily or one tab at night for anxiety   aspirin EC 81 MG tablet Take 81 mg by mouth daily.   busPIRone (BUSPAR) 5 MG tablet Take 5 mg by mouth 2 (two) times daily. As needed   Calcium Carb-Cholecalciferol (CALCIUM 600 + D PO) Take 1 tablet by mouth daily.    dapagliflozin propanediol (FARXIGA) 10 MG TABS tablet Take 1 tablet (10 mg total) by mouth daily.   diclofenac Sodium (VOLTAREN) 1 % GEL APPLY 2 G TOPICALLY FOUR TIMES DAILY.   ENTRESTO 97-103 MG TAKE 1 TABLET BY MOUTH TWICE A DAY   ezetimibe (ZETIA) 10 MG tablet Take 10 mg by mouth daily.   FLUoxetine (PROZAC)  10 MG capsule Take 10 mg by mouth daily.   furosemide (LASIX) 40 MG tablet TAKE 1 TABLET (40 MG TOTAL) BY MOUTH DAILY. (Patient taking differently: Take by mouth daily as needed for fluid or edema.)   gabapentin (NEURONTIN) 100 MG capsule Take 100 mg by mouth at bedtime.   glucose blood test strip 1 each 3 (three) times daily.    insulin aspart (NOVOLOG FLEXPEN) 100 UNIT/ML FlexPen Inject into the skin.   insulin degludec (TRESIBA) 100 UNIT/ML FlexTouch Pen Inject into the skin.   isosorbide dinitrate (ISORDIL) 30 MG tablet TAKE 1 TABLET BY MOUTH 3 TIMES DAILY.   levothyroxine (SYNTHROID, LEVOTHROID) 50 MCG tablet Take 50 mcg by mouth daily before breakfast.    Magnesium Oxide 500 MG TABS TAKE 1 TABLET (500 MG TOTAL) BY MOUTH IN THE MORNING AND AT BEDTIME. (Patient taking differently: Take 500 mg by mouth in the morning, at noon, and at bedtime.)   metoprolol succinate (TOPROL-XL) 50 MG 24 hr tablet Take 1 tablet (50 mg total) by mouth daily. Take with or immediately following a meal.   nitroGLYCERIN (NITROSTAT) 0.4 MG SL tablet PLACE 1 TABLET UNDER THE TONGUE EVERY 5 MINUTES X 3 DOSES AS NEEDED FOR CHEST PAIN. (  Patient taking differently: Place 0.4 mg under the tongue every 5 (five) minutes as needed for chest pain.)   ONETOUCH VERIO test strip 1 each by Other route 3 (three) times daily.   rosuvastatin (CRESTOR) 40 MG tablet Take 1 tablet (40 mg total) by mouth at bedtime.   vitamin B-12 (CYANOCOBALAMIN) 1000 MCG tablet Take 1,000 mcg by mouth daily.    Vitamin D, Ergocalciferol, (DRISDOL) 50000 units CAPS capsule Take 50,000 Units by mouth every 7 (seven) days. Take on Thursdays   No current facility-administered medications for this visit. (Other)      REVIEW OF SYSTEMS:    ALLERGIES Allergies  Allergen Reactions   Rosiglitazone Maleate Anaphylaxis and Swelling   Morphine Other (See Comments)    SEVERE HYPOTENSION    Cephalexin Diarrhea   Sulfa Antibiotics Diarrhea and Nausea And  Vomiting   Tramadol Other (See Comments)    Unknown reaction   Elavil [Amitriptyline] Nausea Only   Tramadol-Acetaminophen Rash    PAST MEDICAL HISTORY Past Medical History:  Diagnosis Date   Acute combined systolic and diastolic heart failure ([place]) 04/25/2018   Anxiety    Arthritis    "hands" (12/29/2017)   Basal cell carcinoma of nose    removed   Bursitis of left shoulder    BV ICD [employer] [place] MRI CRTD 06/21/2019   Cat scratch fever    Late 90s   Chronic combined systolic and diastolic CHF (congestive heart failure) ([place]) 06/08/2019   Coronary artery disease    Depression    Diabetes mellitus, type II, insulin dependent ([place])    Encounter for assessment of implantable cardioverter-defibrillator (ICD) 09/28/2019   GERD (gastroesophageal reflux disease)    H. pylori infection 2008 and 1998   treated   Hyperlipidemia    Hypertension    Hypothyroidism    Low oxygen saturation    Multinodular goiter    Rotator cuff tear, left recurrent    Urge urinary incontinence    UTI (lower urinary tract infection) 05/2016   Past Surgical History:  Procedure Laterality Date   APPENDECTOMY  AGE 47   BACK SURGERY     BASAL CELL CARCINOMA EXCISION     "nose"   BIV ICD INSERTION CRT-D N/A 06/21/2019   Procedure: BIV ICD INSERTION CRT-D;  Surgeon: Evans Lance, MD;  Location: [facility];  Service: Cardiovascular;  Laterality: N/A;   BLADDER NECK SUSPENSION  1970's   BUNIONECTOMY WITH HAMMERTOE RECONSTRUCTION Bilateral    CARPAL TUNNEL RELEASE Right 05/2017   COLONOSCOPY W/ POLYPECTOMY     CORONARY ANGIOPLASTY WITH STENT PLACEMENT  12/29/2017   CORONARY BALLOON ANGIOPLASTY N/A 02/08/2019   Procedure: CORONARY BALLOON ANGIOPLASTY;  Surgeon: Adrian Prows, MD;  Location: [facility];  Service: Cardiovascular;  Laterality: N/A;   CORONARY STENT INTERVENTION N/A 12/29/2017   Procedure: CORONARY STENT INTERVENTION;  Surgeon: Nigel Mormon, MD;  Location: [facility];  Service: Cardiovascular;  Laterality: N/A;   CORONARY STENT INTERVENTION N/A 04/26/2018   Procedure: CORONARY STENT INTERVENTION;  Surgeon: Nigel Mormon, MD;  Location: [facility];  Service: Cardiovascular;  Laterality: N/A;   CORONARY STENT INTERVENTION N/A 02/08/2019   Procedure: CORONARY STENT INTERVENTION;  Surgeon: Adrian Prows, MD;  Location: [facility];  Service: Cardiovascular;  Laterality: N/A;   ESOPHAGOGASTRODUODENOSCOPY ENDOSCOPY     FORAMINAL DECOMPRESSION AT L2 TO THE SACRUM  01-05-2008   L2  -  S1   GANGLION CYST EXCISION  Left 01/17/2009   ring finger   IMPLANTATION PERMANENT SPINAL CORD STIMULATOR  06-15-2008   JUNE 2013--  BATTERY CHANGE   JOINT REPLACEMENT     LEFT HEART CATH AND CORONARY ANGIOGRAPHY N/A 04/26/2018   Procedure: LEFT HEART CATH AND CORONARY ANGIOGRAPHY;  Surgeon: Nigel Mormon, MD;  Location: [facility];  Service: Cardiovascular;  Laterality: N/A;   LEFT HEART CATH AND CORONARY ANGIOGRAPHY N/A 02/08/2019   Procedure: LEFT HEART CATH AND CORONARY ANGIOGRAPHY;  Surgeon: Adrian Prows, MD;  Location: [facility];  Service: Cardiovascular;  Laterality: N/A;   LIPOMA EXCISION Right    RIGHT ELBOW   METATARSAL HEAD EXCISION Right 06/16/2018   Procedure: right 5th metatarsal excision, a mini c-arm;  Surgeon: Trula Slade, DPM;  Location: [facility];  Service: Podiatry;  Laterality: Right;  anesthesia can do block   RIGHT/LEFT HEART CATH AND CORONARY ANGIOGRAPHY N/A 12/29/2017   Procedure: RIGHT/LEFT HEART CATH AND CORONARY ANGIOGRAPHY;  Surgeon: Nigel Mormon, MD;  Location: [facility];  Service: Cardiovascular;  Laterality: N/A;   SHOULDER OPEN ROTATOR CUFF REPAIR  09/07/2012   Procedure: ROTATOR CUFF REPAIR SHOULDER OPEN;  Surgeon: Magnus Sinning, MD;  Location: [facility];  Service: Orthopedics;  Laterality: Left;  OPEN ANTERIOR ACROMIONECTOMY AND ROTATOR CUFF REPAIR ON LEFT    SHOULDER  OPEN ROTATOR CUFF REPAIR Left 12/28/2012   Procedure: ROTATOR CUFF REPAIR SHOULDER OPEN;  Surgeon: Magnus Sinning, MD;  Location: [facility];  Service: Orthopedics;  Laterality: Left;  OPEN SHOULDER ROTATOR CUFF REPAIR ON LEFT  WITH ANTERIOR ACROMINECTOMY    SHOULDER OPEN ROTATOR CUFF REPAIR Right 03/28/2003   RIGHT SHOULDER  DEGENERATIVE AC JOINT AND RC TEAR   SPINAL CORD STIMULATOR BATTERY EXCHANGE N/A 07/03/2016   Procedure: SPINAL CORD STIMULATOR BATTERY PLACMENT;  Surgeon: Melina Schools, MD;  Location: [facility];  Service: Orthopedics;  Laterality: N/A;   TONSILLECTOMY  AGE 5   TOTAL KNEE ARTHROPLASTY Right 05/06/2000   OA RIGHT KNEE   VAGINAL HYSTERECTOMY  1970's    FAMILY HISTORY Family History  Problem Relation Age of Onset   Cancer Mother        breast   Heart Problems Mother    Prostate cancer Father    Muscular dystrophy Son    Lung cancer Son    Cancer Maternal Grandmother        breast   Heart disease Maternal Grandfather    Breast cancer Sister     SOCIAL HISTORY Social History   Tobacco Use   Smoking status: Never   Smokeless tobacco: Never  Vaping Use   Vaping Use: Never used  Substance Use Topics   Alcohol use: No   Drug use: No         OPHTHALMIC EXAM:  Base Eye Exam     Visual Acuity (ETDRS)       Right Left   Dist [place] 20/100 -1 20/25   Dist ph [place] 20/80 -1          Tonometry (Tonopen, 11:06 AM)       Right Left   Pressure 8 10         Pupils       Pupils Dark Light APD   Right PERRL 4 3 None   Left PERRL 4 3 None         Extraocular Movement       Right Left    Full  Full         Neuro/Psych     Oriented x3: Yes   Mood/Affect: Normal         Dilation     Both eyes: 1.0% Mydriacyl, 2.5% Phenylephrine @ 11:06 AM           Slit Lamp and Fundus Exam     External Exam       Right Left   External Normal Normal         Slit Lamp Exam       Right Left   Lids/Lashes Normal Normal    Conjunctiva/Sclera White and quiet White and quiet   Cornea Clear Clear   Anterior Chamber Deep and quiet Deep and quiet   Iris Round and reactive Round and reactive   Lens Posterior chamber intraocular lens Posterior chamber intraocular lens   Anterior Vitreous Normal Normal         Fundus Exam       Right Left   Posterior Vitreous Normal Normal   Disc Normal Normal   C/D Ratio 0.3 0.3   Macula Microaneurysms, Macular thickening, Moderate clinically significant macular edema, center involved Microaneurysms, Macular thickening, Moderate clinically significant macular edema, superotemporal to FAZ   Vessels NPDR-Severe NPDR-Severe   Periphery Normal Normal            IMAGING AND PROCEDURES  Imaging and Procedures for 07/17/21  OCT, Retina - OU - Both Eyes       Right Eye Quality was good. Scan locations included subfoveal. Central Foveal Thickness: 589. Progression has no prior data. Findings include abnormal foveal contour.   Left Eye Quality was good. Scan locations included extrafoveal. Central Foveal Thickness: 345. Progression has no prior data. Findings include abnormal foveal contour.   Notes CSME OU, with center involvement OD, now 9 week 2 days post recent Avastin OD improved.  OS increased CSME still active OS at 2 week postinjection       Fluorescein Angiography Optos (Transit OD)       Injection: 500 mg Fluorescein Sodium 10 %   Route: Intravenous   NDC: 5364-6803-21   Right Eye   Progression has worsened. Early phase findings include leakage, microaneurysm. Mid/Late phase findings include leakage, microaneurysm, retinal neovascularization. Choroidal neovascularization is not present.   Left Eye Mid/Late phase findings include microaneurysm, leakage. Choroidal neovascularization is not present.   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 11:38 AM. Confirmed correct patient, procedure, site, and patient consented.   Anesthesia Topical anesthesia was used. Anesthetic medications included [place] 3.5%.   Procedure Preparation included Tobramycin 0.3%, 10% betadine to eyelids, 5% betadine to ocular surface. A 30 gauge needle was used.   Injection: 2.5 mg bevacizumab 2.5 MG/0.1ML   Route: Intravitreal, Site: Right Eye   NDC: [phone], Lot: [id number]   Post-op Post injection exam found visual acuity of at least counting fingers. The patient tolerated the procedure well. There were no complications. The patient received written and verbal post procedure care education. Post injection medications were not given.      Color Fundus Photography Optos - OU - Both Eyes       Right Eye Progression has no prior data. Disc findings include normal observations. Macula : microaneurysms, edema. Vessels : Neovascularization.   Left Eye Disc  findings include normal observations. Macula : microaneurysms.   Notes Small retinal neovascularization seen on the inferonasal arcade to disc area diameters from the nerve             ASSESSMENT/PLAN:  No problem-specific Assessment & Plan notes found for this encounter.      ICD-10-CM   1. Severe nonproliferative diabetic retinopathy of left eye, with macular edema, associated with type 2 diabetes mellitus (HCC)  C16.6063 OCT, Retina - OU - Both Eyes    Color Fundus Photography Optos - OU - Both Eyes    2. Severe nonproliferative diabetic retinopathy of right eye, with macular edema, associated with type 2 diabetes mellitus (HCC)  E11.3411 OCT, Retina - OU - Both Eyes    Fluorescein Angiography Optos (Transit OD)    Intravitreal Injection, Pharmacologic Agent - OD - Right Eye    Fluorescein Sodium 10 % injection 500 mg    Color Fundus Photography Optos - OU - Both Eyes    bevacizumab (AVASTIN) SOSY 2.5 mg       CSME OS 2 weeks post injection Avastin slightly improved OD with massive worsening of CSME center involved at 9-week follow-up post most recent injection repeat injection today  2.  Fluorescein angiography confirms the need for injection but also the presence surprisingly with small neovascularization elsewhere along the inferonasal arcade OD.  This will require peripheral PRP to commence soon for permanent control of this condition, return in 2 to 3 weeks for PRP OD  3.  Dilate OS next as scheduled likely Avastin OCT  4.  Dilate OD in 6 weeks as well, likely injection Avastin at that time OD  Ophthalmic Meds Ordered this visit:  Meds ordered this encounter  Medications   Fluorescein Sodium 10 % injection 500 mg   bevacizumab (AVASTIN) SOSY 2.5 mg       Return in about 3 weeks (around 08/07/2021) for dilate, OD, PRP,, follow-up OS as scheduled Avastin OCT,, OD 6 weeks Avastin OCT.  There are no Patient Instructions on file for this visit.   Explained the diagnoses, plan, and follow up with the patient and they expressed understanding.  Patient expressed understanding of the importance of proper follow up care.   Clent Demark Kenny Stern M.D. Diseases & Surgery of the Retina and Vitreous [facility] [place] 07/17/21     Abbreviations: M myopia (nearsighted); A astigmatism; H hyperopia (farsighted); P presbyopia; Mrx spectacle prescription;  CTL contact lenses; OD right eye; OS left eye; OU both eyes  XT exotropia; ET esotropia; PEK punctate epithelial keratitis; PEE punctate epithelial erosions; DES dry eye syndrome; MGD meibomian gland dysfunction; ATs artificial tears; PFAT's preservative free artificial tears; [place] nuclear sclerotic cataract; PSC posterior subcapsular cataract; ERM epi-retinal membrane; PVD posterior vitreous detachment; RD retinal detachment; DM diabetes mellitus; DR diabetic retinopathy; NPDR non-proliferative diabetic retinopathy; PDR proliferative diabetic  retinopathy; CSME clinically significant macular edema; DME diabetic macular edema; dbh dot blot hemorrhages; CWS cotton wool spot; POAG primary open angle glaucoma; C/D cup-to-disc ratio; HVF humphrey visual field; GVF goldmann visual field; OCT optical coherence tomography; IOP intraocular pressure; BRVO Branch retinal vein occlusion; CRVO central retinal vein occlusion; CRAO central retinal artery occlusion; BRAO branch retinal artery occlusion; RT retinal tear; SB scleral buckle; PPV pars plana vitrectomy; VH Vitreous hemorrhage; PRP panretinal laser photocoagulation; IVK intravitreal kenalog; VMT vitreomacular traction; MH Macular hole;  NVD neovascularization of the disc; NVE neovascularization elsewhere; AREDS age related eye disease study; ARMD age related macular degeneration;  POAG primary open angle glaucoma; EBMD epithelial/anterior basement membrane dystrophy; ACIOL anterior chamber intraocular lens; IOL intraocular lens; PCIOL posterior chamber intraocular lens; Phaco/IOL phacoemulsification with intraocular lens placement; [place] photorefractive keratectomy; LASIK laser assisted in situ keratomileusis; HTN hypertension; DM diabetes mellitus; COPD chronic obstructive pulmonary disease

## 2021-08-07 ENCOUNTER — Encounter (INDEPENDENT_AMBULATORY_CARE_PROVIDER_SITE_OTHER): Payer: Medicare PPO | Admitting: Ophthalmology

## 2021-08-08 ENCOUNTER — Other Ambulatory Visit: Payer: Self-pay | Admitting: Cardiology

## 2021-08-08 DIAGNOSIS — I255 Ischemic cardiomyopathy: Secondary | ICD-10-CM

## 2021-08-08 DIAGNOSIS — I5042 Chronic combined systolic (congestive) and diastolic (congestive) heart failure: Secondary | ICD-10-CM

## 2021-08-12 ENCOUNTER — Other Ambulatory Visit: Payer: Self-pay

## 2021-08-12 ENCOUNTER — Ambulatory Visit (INDEPENDENT_AMBULATORY_CARE_PROVIDER_SITE_OTHER): Payer: Medicare PPO | Admitting: Ophthalmology

## 2021-08-12 DIAGNOSIS — E113411 Type 2 diabetes mellitus with severe nonproliferative diabetic retinopathy with macular edema, right eye: Secondary | ICD-10-CM

## 2021-08-12 NOTE — Progress Notes (Signed)
08/12/2021     CHIEF COMPLAINT Patient presents for  Chief Complaint  Patient presents with   Retina Follow Up      HISTORY OF PRESENT ILLNESS: Sara Graves is a 80 y.o. female who presents to the clinic today for:   HPI     Retina Follow Up   Patient presents with  Diabetic Retinopathy.  In left eye.  This started 3 weeks ago.  Severity is mild.  Duration of 3 weeks.  Since onset it is stable.        Comments   3 week 5 days prp od. Patient states vision has improved since last injection at last visit. Denies any new floaters or FOL.       Last edited by Laurin Coder on 08/12/2021 10:27 AM.      Referring physician: [name], PA-C [place],  [place] [address]  HISTORICAL INFORMATION:   Selected notes from the MEDICAL RECORD NUMBER    Lab Results  Component Value Date   HGBA1C 9.8 (H) 02/12/2021     CURRENT MEDICATIONS: No current outpatient medications on file. (Ophthalmic Drugs)   No current facility-administered medications for this visit. (Ophthalmic Drugs)   Current Outpatient Medications (Other)  Medication Sig   alendronate (FOSAMAX) 70 MG tablet Take 1 tablet (70 mg total) by mouth once a week.   ALPRAZolam (XANAX) 0.25 MG tablet Take 1 tablet (0.25 mg total) by mouth as directed. 1/2 tablet r twice daily or one tab at night for anxiety   aspirin EC 81 MG tablet Take 81 mg by mouth daily.   busPIRone (BUSPAR) 5 MG tablet Take 5 mg by mouth 2 (two) times daily. As needed   Calcium Carb-Cholecalciferol (CALCIUM 600 + D PO) Take 1 tablet by mouth daily.    dapagliflozin propanediol (FARXIGA) 10 MG TABS tablet Take 1 tablet (10 mg total) by mouth daily.   diclofenac Sodium (VOLTAREN) 1 % GEL APPLY 2 G TOPICALLY FOUR TIMES DAILY.   ENTRESTO 97-103 MG TAKE 1 TABLET BY MOUTH TWICE A DAY   ezetimibe (ZETIA) 10 MG tablet Take 10 mg by mouth daily.   FLUoxetine (PROZAC) 10 MG capsule Take 10 mg by mouth daily.   furosemide  (LASIX) 40 MG tablet TAKE 1 TABLET (40 MG TOTAL) BY MOUTH DAILY. (Patient taking differently: Take by mouth daily as needed for fluid or edema.)   gabapentin (NEURONTIN) 100 MG capsule Take 100 mg by mouth at bedtime.   glucose blood test strip 1 each 3 (three) times daily.    insulin aspart (NOVOLOG FLEXPEN) 100 UNIT/ML FlexPen Inject into the skin.   insulin degludec (TRESIBA) 100 UNIT/ML FlexTouch Pen Inject into the skin.   isosorbide dinitrate (ISORDIL) 30 MG tablet TAKE 1 TABLET BY MOUTH 3 TIMES DAILY.   levothyroxine (SYNTHROID, LEVOTHROID) 50 MCG tablet Take 50 mcg by mouth daily before breakfast.    Magnesium Oxide 500 MG TABS TAKE 1 TABLET (500 MG TOTAL) BY MOUTH IN THE MORNING AND AT BEDTIME. (Patient taking differently: Take 500 mg by mouth in the morning, at noon, and at bedtime.)   metoprolol succinate (TOPROL-XL) 50 MG 24 hr tablet TAKE 1 TABLET BY MOUTH DAILY. TAKE WITH OR IMMEDIATELY FOLLOWING A MEAL.   nitroGLYCERIN (NITROSTAT) 0.4 MG SL tablet PLACE 1 TABLET UNDER THE TONGUE EVERY 5 MINUTES X 3 DOSES AS NEEDED FOR CHEST PAIN. (Patient taking differently: Place 0.4 mg under the tongue every 5 (five) minutes  as needed for chest pain.)   ONETOUCH VERIO test strip 1 each by Other route 3 (three) times daily.   rosuvastatin (CRESTOR) 40 MG tablet Take 1 tablet (40 mg total) by mouth at bedtime.   vitamin B-12 (CYANOCOBALAMIN) 1000 MCG tablet Take 1,000 mcg by mouth daily.    Vitamin D, Ergocalciferol, (DRISDOL) 50000 units CAPS capsule Take 50,000 Units by mouth every 7 (seven) days. Take on Thursdays   No current facility-administered medications for this visit. (Other)      REVIEW OF SYSTEMS:    ALLERGIES Allergies  Allergen Reactions   Rosiglitazone Maleate Anaphylaxis and Swelling   Morphine Other (See Comments)    SEVERE HYPOTENSION    Cephalexin Diarrhea   Sulfa Antibiotics Diarrhea and Nausea And Vomiting   Tramadol Other (See Comments)    Unknown reaction    Elavil [Amitriptyline] Nausea Only   Tramadol-Acetaminophen Rash    PAST MEDICAL HISTORY Past Medical History:  Diagnosis Date   Acute combined systolic and diastolic heart failure ([place]) 04/25/2018   Anxiety    Arthritis    "hands" (12/29/2017)   Basal cell carcinoma of nose    removed   Bursitis of left shoulder    BV ICD Med[employer] [place] MRI CRTD 06/21/2019   Cat scratch fever    Late 90s   Chronic combined systolic and diastolic CHF (congestive heart failure) ([place]) 06/08/2019   Coronary artery disease    Depression    Diabetes mellitus, type II, insulin dependent ([place])    Encounter for assessment of implantable cardioverter-defibrillator (ICD) 09/28/2019   GERD (gastroesophageal reflux disease)    H. pylori infection 2008 and 1998   treated   Hyperlipidemia    Hypertension    Hypothyroidism    Low oxygen saturation    Multinodular goiter    Rotator cuff tear, left recurrent    Urge urinary incontinence    UTI (lower urinary tract infection) 05/2016   Past Surgical History:  Procedure Laterality Date   APPENDECTOMY  AGE 52   BACK SURGERY     BASAL CELL CARCINOMA EXCISION     "nose"   BIV ICD INSERTION CRT-D N/A 06/21/2019   Procedure: BIV ICD INSERTION CRT-D;  Surgeon: Evans Lance, MD;  Location: [facility];  Service: Cardiovascular;  Laterality: N/A;   BLADDER NECK SUSPENSION  1970's   BUNIONECTOMY WITH HAMMERTOE RECONSTRUCTION Bilateral    CARPAL TUNNEL RELEASE Right 05/2017   COLONOSCOPY W/ POLYPECTOMY     CORONARY ANGIOPLASTY WITH STENT PLACEMENT  12/29/2017   CORONARY BALLOON ANGIOPLASTY N/A 02/08/2019   Procedure: CORONARY BALLOON ANGIOPLASTY;  Surgeon: Adrian Prows, MD;  Location: [facility];  Service: Cardiovascular;  Laterality: N/A;   CORONARY STENT INTERVENTION N/A 12/29/2017   Procedure: CORONARY STENT INTERVENTION;  Surgeon: Nigel Mormon, MD;  Location: [facility];  Service: Cardiovascular;  Laterality: N/A;   CORONARY STENT  INTERVENTION N/A 04/26/2018   Procedure: CORONARY STENT INTERVENTION;  Surgeon: Nigel Mormon, MD;  Location: [facility];  Service: Cardiovascular;  Laterality: N/A;   CORONARY STENT INTERVENTION N/A 02/08/2019   Procedure: CORONARY STENT INTERVENTION;  Surgeon: Adrian Prows, MD;  Location: [facility];  Service: Cardiovascular;  Laterality: N/A;   ESOPHAGOGASTRODUODENOSCOPY ENDOSCOPY     FORAMINAL DECOMPRESSION AT L2 TO THE SACRUM  01-05-2008   L2  -  S1   GANGLION CYST EXCISION Left 01/17/2009   ring finger   IMPLANTATION PERMANENT SPINAL CORD STIMULATOR  06-15-2008   JUNE 2013--  BATTERY CHANGE   JOINT REPLACEMENT     LEFT HEART CATH AND CORONARY ANGIOGRAPHY N/A 04/26/2018   Procedure: LEFT HEART CATH AND CORONARY ANGIOGRAPHY;  Surgeon: Nigel Mormon, MD;  Location: [facility];  Service: Cardiovascular;  Laterality: N/A;   LEFT HEART CATH AND CORONARY ANGIOGRAPHY N/A 02/08/2019   Procedure: LEFT HEART CATH AND CORONARY ANGIOGRAPHY;  Surgeon: Adrian Prows, MD;  Location: [facility];  Service: Cardiovascular;  Laterality: N/A;   LIPOMA EXCISION Right    RIGHT ELBOW   METATARSAL HEAD EXCISION Right 06/16/2018   Procedure: right 5th metatarsal excision, a mini c-arm;  Surgeon: Trula Slade, DPM;  Location: [facility];  Service: Podiatry;  Laterality: Right;  anesthesia can do block   RIGHT/LEFT HEART CATH AND CORONARY ANGIOGRAPHY N/A 12/29/2017   Procedure: RIGHT/LEFT HEART CATH AND CORONARY ANGIOGRAPHY;  Surgeon: Nigel Mormon, MD;  Location: [facility];  Service: Cardiovascular;  Laterality: N/A;   SHOULDER OPEN ROTATOR CUFF REPAIR  09/07/2012   Procedure: ROTATOR CUFF REPAIR SHOULDER OPEN;  Surgeon: Magnus Sinning, MD;  Location: [facility];  Service: Orthopedics;  Laterality: Left;  OPEN ANTERIOR ACROMIONECTOMY AND ROTATOR CUFF REPAIR ON LEFT    SHOULDER OPEN ROTATOR CUFF REPAIR Left 12/28/2012   Procedure: ROTATOR CUFF  REPAIR SHOULDER OPEN;  Surgeon: Magnus Sinning, MD;  Location: [facility];  Service: Orthopedics;  Laterality: Left;  OPEN SHOULDER ROTATOR CUFF REPAIR ON LEFT  WITH ANTERIOR ACROMINECTOMY    SHOULDER OPEN ROTATOR CUFF REPAIR Right 03/28/2003   RIGHT SHOULDER  DEGENERATIVE AC JOINT AND RC TEAR   SPINAL CORD STIMULATOR BATTERY EXCHANGE N/A 07/03/2016   Procedure: SPINAL CORD STIMULATOR BATTERY PLACMENT;  Surgeon: Melina Schools, MD;  Location: [facility];  Service: Orthopedics;  Laterality: N/A;   TONSILLECTOMY  AGE 24   TOTAL KNEE ARTHROPLASTY Right 05/06/2000   OA RIGHT KNEE   VAGINAL HYSTERECTOMY  1970's    FAMILY HISTORY Family History  Problem Relation Age of Onset   Cancer Mother        breast   Heart Problems Mother    Prostate cancer Father    Muscular dystrophy Son    Lung cancer Son    Cancer Maternal Grandmother        breast   Heart disease Maternal Grandfather    Breast cancer Sister     SOCIAL HISTORY Social History   Tobacco Use   Smoking status: Never   Smokeless tobacco: Never  Vaping Use   Vaping Use: Never used  Substance Use Topics   Alcohol use: No   Drug use: No         OPHTHALMIC EXAM:  Base Eye Exam     Visual Acuity (ETDRS)       Right Left   Dist [place] 20/30 -2 20/30 -2         Tonometry (Tonopen, 10:31 AM)       Right Left   Pressure 11 11         Pupils       Pupils Dark Light APD   Right PERRL 4 3 None   Left PERRL 4 3 None         Extraocular Movement       Right Left    Full Full         Neuro/Psych     Oriented x3: Yes   Mood/Affect: Normal  Dilation     Right eye: 1.0% Mydriacyl, 2.5% Phenylephrine @ 10:31 AM           Slit Lamp and Fundus Exam     External Exam       Right Left   External Normal Normal         Slit Lamp Exam       Right Left   Lids/Lashes Normal Normal   Conjunctiva/Sclera White and quiet White and quiet   Cornea Clear Clear   Anterior  Chamber Deep and quiet Deep and quiet   Iris Round and reactive Round and reactive   Lens Posterior chamber intraocular lens Posterior chamber intraocular lens   Anterior Vitreous Normal Normal         Fundus Exam       Right Left   Posterior Vitreous Normal    Disc Normal    C/D Ratio 0.3    Macula Microaneurysms, Macular thickening, Moderate clinically significant macular edema, center involved    Vessels NPDR-Severe    Periphery Normal             IMAGING AND PROCEDURES  Imaging and Procedures for 08/12/21  Panretinal Photocoagulation - OD - Right Eye       Time Out Confirmed correct patient, procedure, site, and patient consented.   Anesthesia Topical anesthesia was used. Anesthetic medications included Proparacaine 0.5%.   Laser Information The type of laser was diode. Color was yellow. The duration in seconds was 0.03. The spot size was 390 microns. Laser power was 300. Total spots was 684.   Post-op The patient tolerated the procedure well. There were no complications. The patient received written and verbal post procedure care education.   Notes Peripheral anterior PRP delivered nasal and inferonasal OD             ASSESSMENT/PLAN:  Severe nonproliferative diabetic retinopathy of right eye, with macular edema, associated with type 2 diabetes mellitus (HCC) Active CSME OD.  With early PDR noted on last fluorescein angiography.  Post recent injection Avastin for PRP #1 today nasal inferonasal     ICD-10-CM   1. Severe nonproliferative diabetic retinopathy of right eye, with macular edema, associated with type 2 diabetes mellitus (HCC)  N46.2703 Panretinal Photocoagulation - OD - Right Eye      1.  Early PDR OD with CSME.  Extensive retinal peripheral nonperfusion OD.  PRP #1 delivered nasal and inferonasal today.  Follow-up PRP #2 in 9 weeks  2.  OD with CSME, schedule follow-up within 2 to 3 weeks as already scheduled for likely injection  Avastin  3.  Ophthalmic Meds Ordered this visit:  No orders of the defined types were placed in this encounter.      Return in about 9 weeks (around 10/14/2021) for dilate, OD, PRP.  There are no Patient Instructions on file for this visit.   Explained the diagnoses, plan, and follow up with the patient and they expressed understanding.  Patient expressed understanding of the importance of proper follow up care.   Clent Demark Rakayla Ricklefs M.D. Diseases & Surgery of the Retina and Vitreous Retina & Diabetic [place] 08/12/21     Abbreviations: M myopia (nearsighted); A astigmatism; H hyperopia (farsighted); P presbyopia; Mrx spectacle prescription;  CTL contact lenses; OD right eye; OS left eye; OU both eyes  XT exotropia; ET esotropia; PEK punctate epithelial keratitis; PEE punctate epithelial erosions; DES dry eye syndrome; MGD meibomian gland dysfunction; ATs artificial tears; PFAT's preservative free  artificial tears; [place] nuclear sclerotic cataract; PSC posterior subcapsular cataract; ERM epi-retinal membrane; PVD posterior vitreous detachment; RD retinal detachment; DM diabetes mellitus; DR diabetic retinopathy; NPDR non-proliferative diabetic retinopathy; PDR proliferative diabetic retinopathy; CSME clinically significant macular edema; DME diabetic macular edema; dbh dot blot hemorrhages; CWS cotton wool spot; POAG primary open angle glaucoma; C/D cup-to-disc ratio; HVF humphrey visual field; GVF goldmann visual field; OCT optical coherence tomography; IOP intraocular pressure; BRVO Branch retinal vein occlusion; CRVO central retinal vein occlusion; CRAO central retinal artery occlusion; BRAO branch retinal artery occlusion; RT retinal tear; SB scleral buckle; PPV pars plana vitrectomy; VH Vitreous hemorrhage; PRP panretinal laser photocoagulation; IVK intravitreal kenalog; VMT vitreomacular traction; MH Macular hole;  NVD neovascularization of the disc; NVE neovascularization elsewhere;  AREDS age related eye disease study; ARMD age related macular degeneration; POAG primary open angle glaucoma; EBMD epithelial/anterior basement membrane dystrophy; ACIOL anterior chamber intraocular lens; IOL intraocular lens; PCIOL posterior chamber intraocular lens; Phaco/IOL phacoemulsification with intraocular lens placement; [place] photorefractive keratectomy; LASIK laser assisted in situ keratomileusis; HTN hypertension; DM diabetes mellitus; COPD chronic obstructive pulmonary disease

## 2021-08-12 NOTE — Assessment & Plan Note (Signed)
Active CSME OD.  With early PDR noted on last fluorescein angiography.  Post recent injection Avastin for PRP #1 today nasal inferonasal

## 2021-08-27 ENCOUNTER — Encounter: Payer: Self-pay | Admitting: Cardiology

## 2021-08-27 ENCOUNTER — Other Ambulatory Visit: Payer: Self-pay

## 2021-08-27 ENCOUNTER — Ambulatory Visit: Payer: Medicare PPO | Admitting: Cardiology

## 2021-08-27 VITALS — BP 151/53 | HR 50 | Temp 97.7°F | Resp 17 | Ht 62.0 in | Wt 201.8 lb

## 2021-08-27 DIAGNOSIS — Z95810 Presence of automatic (implantable) cardiac defibrillator: Secondary | ICD-10-CM

## 2021-08-27 DIAGNOSIS — E1159 Type 2 diabetes mellitus with other circulatory complications: Secondary | ICD-10-CM

## 2021-08-27 DIAGNOSIS — Z955 Presence of coronary angioplasty implant and graft: Secondary | ICD-10-CM

## 2021-08-27 DIAGNOSIS — E7800 Pure hypercholesterolemia, unspecified: Secondary | ICD-10-CM

## 2021-08-27 DIAGNOSIS — I5041 Acute combined systolic (congestive) and diastolic (congestive) heart failure: Secondary | ICD-10-CM

## 2021-08-27 DIAGNOSIS — I2510 Atherosclerotic heart disease of native coronary artery without angina pectoris: Secondary | ICD-10-CM

## 2021-08-27 DIAGNOSIS — I447 Left bundle-branch block, unspecified: Secondary | ICD-10-CM

## 2021-08-27 DIAGNOSIS — Z794 Long term (current) use of insulin: Secondary | ICD-10-CM

## 2021-08-27 DIAGNOSIS — I255 Ischemic cardiomyopathy: Secondary | ICD-10-CM

## 2021-08-27 MED ORDER — REPATHA SURECLICK 140 MG/ML ~~LOC~~ SOAJ: 140.0000 mg | SUBCUTANEOUS | 3 refills | Status: DC

## 2021-08-27 NOTE — Progress Notes (Signed)
Primary Physician:  [name], PA-C   [name], female    DOB: [birth year], 80 y.o.   MRN: [id number]   Date: 08/27/21 Last Office Visit: 05/24/2021   Subjective:    Chief Complaint  Patient presents with   Chronic combined systolic and diastolic heart failure, NYHA   Follow-up    HPI: Sara Graves  is a 80 y.o. female  with hypertension, insulin-dependent diabetes mellitus type 2,  CAD s/p prior LAD and ramus PCI,  HFrEF, ischemic cardiomyopathy, s/p BiV ICD implantation 06/2019,  h/o Rt leg cellulitis and metatarsal fracture (managed by Podiatry) with chief complaint of " 3 month followup for congestive heart failure."   Last hospitalized for congestive heart failure in March 2022 most likely secondary to dietary indiscretion after losing her son/grieving process.  During the hospitalization she had diuresed a total net negative urine output of 8 L.  And since then her medications have been uptitrated.  Today she presents for 17-month follow-up visit.  Unfortunately, over the last 3 months she has gained approximately 16 pounds.  This may be combination of both dietary indiscretion and possible volume overload.  Patient states that she is currently helping her sister who has not been doing well and therefore has not been taking care of herself.  She use to keep a log of her blood pressures and weight daily but she has stopped doing so as well.  Her last pacemaker transmission was 07/31/2021.  She is currently in the process of getting a new transmitter.  Patient denies chest pain at rest or with effort related activities.  No use of sublingual nitroglycerin tablets.  No hospitalizations or urgent care visits for cardiovascular symptoms since last office encounter.   Past Medical History:  Diagnosis Date   Acute combined systolic and diastolic heart failure ([place]) 04/25/2018   Anxiety    Arthritis    "hands" (12/29/2017)   Basal cell carcinoma of nose     removed   Bursitis of left shoulder    BV ICD [employer] [place] MRI CRTD 06/21/2019   Cat scratch fever    Late 90s   Chronic combined systolic and diastolic CHF (congestive heart failure) ([place]) 06/08/2019   Coronary artery disease    Depression    Diabetes mellitus, type II, insulin dependent ([place])    Encounter for assessment of implantable cardioverter-defibrillator (ICD) 09/28/2019   GERD (gastroesophageal reflux disease)    H. pylori infection 2008 and 1998   treated   Hyperlipidemia    Hypertension    Hypothyroidism    Low oxygen saturation    Macular degeneration    Multinodular goiter    Rotator cuff tear, left recurrent    Urge urinary incontinence    UTI (lower urinary tract infection) 05/2016    Past Surgical History:  Procedure Laterality Date   APPENDECTOMY  AGE 53   BACK SURGERY     BASAL CELL CARCINOMA EXCISION     "nose"   BIV ICD INSERTION CRT-D N/A 06/21/2019   Procedure: BIV ICD INSERTION CRT-D;  Surgeon: Evans Lance, MD;  Location: [place] [facility];  Service: Cardiovascular;  Laterality: N/A;   BLADDER NECK SUSPENSION  1970's   BUNIONECTOMY WITH HAMMERTOE RECONSTRUCTION Bilateral    CARPAL TUNNEL RELEASE Right 05/2017   COLONOSCOPY W/ POLYPECTOMY     CORONARY ANGIOPLASTY WITH STENT PLACEMENT  12/29/2017   CORONARY BALLOON ANGIOPLASTY N/A 02/08/2019   Procedure: CORONARY BALLOON ANGIOPLASTY;  Surgeon:  Adrian Prows, MD;  Location: [place];  Service: Cardiovascular;  Laterality: N/A;   CORONARY STENT INTERVENTION N/A 12/29/2017   Procedure: CORONARY STENT INTERVENTION;  Surgeon: Nigel Mormon, MD;  Location: [place] [facility];  Service: Cardiovascular;  Laterality: N/A;   CORONARY STENT INTERVENTION N/A 04/26/2018   Procedure: CORONARY STENT INTERVENTION;  Surgeon: Nigel Mormon, MD;  Location: [place] [facility];  Service: Cardiovascular;  Laterality: N/A;   CORONARY STENT INTERVENTION N/A 02/08/2019   Procedure: CORONARY STENT  INTERVENTION;  Surgeon: Adrian Prows, MD;  Location: [facility] [facility];  Service: Cardiovascular;  Laterality: N/A;   ESOPHAGOGASTRODUODENOSCOPY ENDOSCOPY     FORAMINAL DECOMPRESSION AT L2 TO THE SACRUM  01-05-2008   L2  -  S1   GANGLION CYST EXCISION Left 01/17/2009   ring finger   IMPLANTATION PERMANENT SPINAL CORD STIMULATOR  06-15-2008   JUNE 2013--  BATTERY CHANGE   JOINT REPLACEMENT     LEFT HEART CATH AND CORONARY ANGIOGRAPHY N/A 04/26/2018   Procedure: LEFT HEART CATH AND CORONARY ANGIOGRAPHY;  Surgeon: Nigel Mormon, MD;  Location: [facility];  Service: Cardiovascular;  Laterality: N/A;   LEFT HEART CATH AND CORONARY ANGIOGRAPHY N/A 02/08/2019   Procedure: LEFT HEART CATH AND CORONARY ANGIOGRAPHY;  Surgeon: Adrian Prows, MD;  Location: [place];  Service: Cardiovascular;  Laterality: N/A;   LIPOMA EXCISION Right    RIGHT ELBOW   METATARSAL HEAD EXCISION Right 06/16/2018   Procedure: right 5th metatarsal excision, a mini c-arm;  Surgeon: Trula Slade, DPM;  Location: [facility];  Service: Podiatry;  Laterality: Right;  anesthesia can do block   RIGHT/LEFT HEART CATH AND CORONARY ANGIOGRAPHY N/A 12/29/2017   Procedure: RIGHT/LEFT HEART CATH AND CORONARY ANGIOGRAPHY;  Surgeon: Nigel Mormon, MD;  Location: [facility] [facility];  Service: Cardiovascular;  Laterality: N/A;   SHOULDER OPEN ROTATOR CUFF REPAIR  09/07/2012   Procedure: ROTATOR CUFF REPAIR SHOULDER OPEN;  Surgeon: Magnus Sinning, MD;  Location: [facility];  Service: Orthopedics;  Laterality: Left;  OPEN ANTERIOR ACROMIONECTOMY AND ROTATOR CUFF REPAIR ON LEFT    SHOULDER OPEN ROTATOR CUFF REPAIR Left 12/28/2012   Procedure: ROTATOR CUFF REPAIR SHOULDER OPEN;  Surgeon: Magnus Sinning, MD;  Location: [facility];  Service: Orthopedics;  Laterality: Left;  OPEN SHOULDER ROTATOR CUFF REPAIR ON LEFT  WITH ANTERIOR ACROMINECTOMY    SHOULDER OPEN ROTATOR CUFF REPAIR Right  03/28/2003   RIGHT SHOULDER  DEGENERATIVE AC JOINT AND RC TEAR   SPINAL CORD STIMULATOR BATTERY EXCHANGE N/A 07/03/2016   Procedure: SPINAL CORD STIMULATOR BATTERY PLACMENT;  Surgeon: Melina Schools, MD;  Location: [facility];  Service: Orthopedics;  Laterality: N/A;   TONSILLECTOMY  AGE 46   TOTAL KNEE ARTHROPLASTY Right 05/06/2000   OA RIGHT KNEE   VAGINAL HYSTERECTOMY  1970's   Social History   Tobacco Use   Smoking status: Never   Smokeless tobacco: Never  Vaping Use   Vaping Use: Never used  Substance Use Topics   Alcohol use: No   Drug use: No    Review of Systems  Constitutional: Positive for weight gain. Negative for chills, decreased appetite and malaise/fatigue.  Cardiovascular:  Positive for dyspnea on exertion (improving). Negative for chest pain, leg swelling, orthopnea and syncope.  Respiratory:  Negative for cough.   Endocrine: Negative for cold intolerance.  Hematologic/Lymphatic: Does not bruise/bleed easily.  Musculoskeletal:  Negative for falls and joint swelling.  Gastrointestinal:  Negative for abdominal pain, anorexia, change in bowel habit, diarrhea and nausea.  Neurological:  Negative for headaches and light-headedness.  Psychiatric/Behavioral:  Negative for depression and substance abuse.   All other systems reviewed and are negative.    Objective:   Vitals with BMI 08/27/2021 08/27/2021 06/07/2021  Height - 5\' 2"  -  Weight - 201 lbs 13 oz -  BMI - 42.7 -  Systolic 062 376 283  Diastolic 53 52 93  Pulse 50 55 50   CONSTITUTIONAL: Appears older than stated age, hemodynamically stable, no acute distress.  SKIN: Skin is warm and dry. No rash noted. No cyanosis. No pallor. No jaundice HEAD: Normocephalic and atraumatic.  EYES: No scleral icterus MOUTH/THROAT: Moist oral membranes.  NECK: No JVD present. No thyromegaly noted.  Bilateral carotid bruits  LYMPHATIC: No visible cervical adenopathy.  CHEST Normal respiratory effort. No intercostal retractions.   BiV ICD site is clean dry and intact LUNGS: Clear to auscultation bilaterally.  No stridor. No wheezes. No rales.  CARDIOVASCULAR: Regular, positive T5-V7, soft holosystolic murmur heard at the apex, no gallops or rubs ABDOMINAL: Soft, nontender, nondistended, positive bowel sounds in all 4 quadrants, no apparent ascites.  EXTREMITIES: +1 bilateral peripheral edema  HEMATOLOGIC: No significant bruising NEUROLOGIC: Oriented to person, place, and time. Nonfocal. Normal muscle tone.  PSYCHIATRIC: Normal mood and affect. Normal behavior. Cooperative   Radiology: No results found.  Laboratory examination:   CMP Latest Ref Rng & Units 07/05/2021 06/24/2021 06/07/2021  Glucose 65 - 99 mg/dL 114(H) 95 124(H)  BUN 8 - 27 mg/dL 55(H) 62(H) 52(H)  Creatinine 0.57 - 1.00 mg/dL 1.28(H) 1.51(H) 1.20(H)  Sodium 134 - 144 mmol/L 143 142 142  Potassium 3.5 - 5.2 mmol/L 4.8 4.2 5.3(H)  Chloride 96 - 106 mmol/L 108(H) 107(H) 113(H)  CO2 20 - 29 mmol/L 20 19(L) 20(L)  Calcium 8.7 - 10.3 mg/dL 9.2 9.8 9.5  Total Protein 6.0 - 8.5 g/dL - - -  Total Bilirubin 0.0 - 1.2 mg/dL - - -  Alkaline Phos 39 - 117 IU/L - - -  AST 0 - 40 IU/L - - -  ALT 0 - 32 IU/L - - -   CBC Latest Ref Rng & Units 06/07/2021 02/12/2021 02/11/2021  WBC 4.0 - 10.5 K/uL 5.0 4.1 4.4  Hemoglobin 12.0 - 15.0 g/dL 12.4 11.9(L) 13.1  Hematocrit 36.0 - 46.0 % 36.6 36.5 41.0  Platelets 150 - 400 K/uL 102(L) 92(L) 94(L)   Lipid Panel  Lab Results  Component Value Date   CHOL 203 (H) 06/03/2021   HDL 68 06/03/2021   LDLCALC 119 (H) 06/03/2021   LDLDIRECT 112 (H) 06/03/2021   TRIG 88 06/03/2021   CHOLHDL 2.9 12/26/2019    HEMOGLOBIN A1C Lab Results  Component Value Date   HGBA1C 9.8 (H) 02/12/2021   MPG 234.56 02/12/2021   TSH No results for input(s): TSH in the last 8760 hours. BNP (last 3 results) Recent Labs    02/11/21 2256 02/13/21 0218 02/14/21 0026  BNP 4,088.9* 2,726.8* 1,477.4*    ProBNP (last 3  results) Recent Labs    02/22/21 1422 06/03/21 0933 07/05/21 1419  PROBNP 5,475* 2,497* 2,100*   PRN Meds:. Medications Discontinued During This Encounter  Medication Reason   furosemide (LASIX) 40 MG tablet Error   spironolactone (ALDACTONE) 25 MG tablet Patient Preference   Current Meds  Medication Sig   alendronate (FOSAMAX) 70 MG tablet Take 1 tablet (70 mg total) by mouth once a  week.   ALPRAZolam (XANAX) 0.25 MG tablet Take 1 tablet (0.25 mg total) by mouth as directed. 1/2 tablet r twice daily or one tab at night for anxiety   aspirin EC 81 MG tablet Take 81 mg by mouth daily.   busPIRone (BUSPAR) 5 MG tablet Take 5 mg by mouth 2 (two) times daily. As needed   Calcium Carb-Cholecalciferol (CALCIUM 600 + D PO) Take 1 tablet by mouth daily.    dapagliflozin propanediol (FARXIGA) 10 MG TABS tablet Take 1 tablet (10 mg total) by mouth daily.   diclofenac Sodium (VOLTAREN) 1 % GEL APPLY 2 G TOPICALLY FOUR TIMES DAILY.   ENTRESTO 97-103 MG TAKE 1 TABLET BY MOUTH TWICE A DAY   Evolocumab (REPATHA SURECLICK) 250 MG/ML SOAJ Inject 140 mg into the skin every 14 (fourteen) days for 6 doses.   ezetimibe (ZETIA) 10 MG tablet Take 10 mg by mouth daily.   FLUoxetine (PROZAC) 10 MG capsule Take 10 mg by mouth daily.   gabapentin (NEURONTIN) 100 MG capsule Take 100 mg by mouth at bedtime.   glucose blood test strip 1 each 3 (three) times daily.    insulin aspart (NOVOLOG FLEXPEN) 100 UNIT/ML FlexPen Inject into the skin.   insulin degludec (TRESIBA) 100 UNIT/ML FlexTouch Pen Inject into the skin.   isosorbide dinitrate (ISORDIL) 30 MG tablet TAKE 1 TABLET BY MOUTH 3 TIMES DAILY.   levothyroxine (SYNTHROID, LEVOTHROID) 50 MCG tablet Take 50 mcg by mouth daily before breakfast.    Magnesium Oxide 500 MG TABS TAKE 1 TABLET (500 MG TOTAL) BY MOUTH IN THE MORNING AND AT BEDTIME. (Patient taking differently: Take 500 mg by mouth in the morning, at noon, and at bedtime.)   methocarbamol (ROBAXIN)  500 MG tablet Take 500 mg by mouth 4 (four) times daily.   metoprolol succinate (TOPROL-XL) 50 MG 24 hr tablet TAKE 1 TABLET BY MOUTH DAILY. TAKE WITH OR IMMEDIATELY FOLLOWING A MEAL.   nitroGLYCERIN (NITROSTAT) 0.4 MG SL tablet PLACE 1 TABLET UNDER THE TONGUE EVERY 5 MINUTES X 3 DOSES AS NEEDED FOR CHEST PAIN. (Patient taking differently: Place 0.4 mg under the tongue every 5 (five) minutes as needed for chest pain.)   ONETOUCH VERIO test strip 1 each by Other route 3 (three) times daily.   rosuvastatin (CRESTOR) 40 MG tablet Take 1 tablet (40 mg total) by mouth at bedtime.   torsemide (DEMADEX) 20 MG tablet Take 20 mg by mouth every other day.   vitamin B-12 (CYANOCOBALAMIN) 1000 MCG tablet Take 1,000 mcg by mouth daily.    Vitamin D, Ergocalciferol, (DRISDOL) 50000 units CAPS capsule Take 50,000 Units by mouth every 7 (seven) days. Take on Thursdays   [DISCONTINUED] spironolactone (ALDACTONE) 25 MG tablet Take 12.5 mg by mouth daily.    Cardiac Studies:   EKG: 08/27/2021: Atrial and ventricular paced rhythm.   Echocardiogram: 02/12/2021: LVEF 20-25%, severely reduced left ventricular systolic function, global hypokinesis, LV size moderately dilated, mild LVH, right ventricular systolic function moderately reduced, RV size mildly enlarged, PASP 68 mmHg, severe left atrial dilatation, moderately dilated right atrium, moderate to severe MR, moderate to severe TR, mild AR, IVC dilated estimated RAP 15 mmHg.   Stress test: None   Heart catheterization: Coronary angiogram 02/08/2019:  Distal left main 20% diffuse disease.  Ostial LAD stent shows diffuse 20% in-stent restenosis (3.0 x 15 mm resolute Onyx on 12/29/2017).  Mid LAD and mid to distal LAD there are tandem lesions 80% and 95%.  Ramus intermediate ostial  95 to 99% stenosis.  Previously placed RI stent widely patent ( 2.0 x 12 mm resolute on 04/26/2018).  30 to 40% tandem lesions in the RCA. Cutting Balloon angioplasty of the ramus  intermediate ostial high-grade stenosis, 99% reduced to 0% and Cutting Balloon angioplasty of the mid LAD followed by stenting with 2.5 x 32 mm Synergy DES for high-grade 90% stenosis reduced to 0% and TIMI-3 to TIMI-3 flow maintained in both lesions.   Carotid artery duplex 12/02/2018: No hemodynamically significant arterial disease in the internal carotid artery bilaterally. Minimal plaque noted bilateral carotid arteries. Antegrade right vertebral artery flow. Antegrade left vertebral artery flow.  Remote CRT-D transmission 08/09/2021: AP 85%, CRT-D 97.25%.  Impedance and threshold within normal limits.  Longevity 7 years 9 months. There were no high ventricular rate episodes.  Occasional ventricle sensing = PVC.  No atrial fibrillation or more switches.  Thoracic impedance does not suggest wall him overload state. Normal CRT-D function.   Assessment:     ICD-10-CM   1. Acute combined systolic and diastolic CHF, NYHA class 3 (HCC)  I50.41 EKG 12-Lead    Pro b natriuretic peptide (BNP)    Magnesium    Basic metabolic panel    2. Ischemic cardiomyopathy  I25.5 Evolocumab (REPATHA SURECLICK) 947 MG/ML SOAJ    3. Pure hypercholesterolemia  E78.00 Evolocumab (REPATHA SURECLICK) 096 MG/ML SOAJ    4. Biventricular ICD (implantable cardioverter-defibrillator) in place  Z95.810     5. Coronary artery disease involving native coronary artery of native heart without angina pectoris  I25.10 Evolocumab (REPATHA SURECLICK) 283 MG/ML SOAJ    6. Status post primary angioplasty with coronary stent  Z95.5 Evolocumab (REPATHA SURECLICK) 662 MG/ML SOAJ    7. Left bundle branch block  I44.7     8. Type 2 diabetes mellitus with other circulatory complication, with long-term current use of insulin (HCC)  E11.59    Z79.4     9. Long-term insulin use ([place])  Z79.4      Recommendations:   Acute combined systolic and diastolic CHF, NYHA class 3 ([place]) Has gained approximately 16 pounds in the last 3  months. I suspect majority of it is secondary to caloric indiscretion with some fluid accumulation.  No JVP on physical examination, +1 pitting edema bilaterally, denies orthopnea, paroxysmal nocturnal dyspnea, has not required oxygen per nasal cannula like she has not done so in the past. Check BMP, magnesium, NT proBNP Medications reconciled Spironolactone was discontinued since last office visit due to hyperkalemia. Given her renal function and hyperkalemia she is currently taking Entresto 97/103 mg half a tablet twice a day.  If her renal function is stable we will like to increase her Entresto to 97/103 mg full tablet twice a day. No longer on furosemide and takes torsemide 3 times a week. Educated on the importance of low-salt diet Daily weights recommended.  Ischemic cardiomyopathy Stable.   Status post coronary intervention and CRT-D. Medications reconciled. No additional cardiovascular testing warranted at this time.  Pure hypercholesterolemia In setting of coronary artery disease, ischemic cardiomyopathy, diabetes her LDL is currently not at goal. Fasting lipid profile as of July 2022 notes an LDL of 112 mg/dL.  Despite being on high intensity statin and Zetia. Will start PCSK9 inhibitors -Repatha 140 mg p.o. q. 14 days. Check fasting lipid profile prior to next office visit. Patient is encouraged to come to the office for nurse visit to learn how to inject Repatha.  Biventricular ICD (implantable cardioverter-defibrillator) in place  Most recent remote transmission 08/09/2021 independently reviewed. Thoracic impedance is within normal limits. Patient is in the process of obtaining a new pacemaker transmission. Monitor peripheral  Coronary artery disease involving native coronary artery of native heart without angina pectoris Chest pain-free. No use of sublingual nitroglycerin tablets. EKG shows paced atrial ventricular rhythm. Continue current medical therapy.  Type 2  diabetes mellitus with other circulatory complication, with long-term current use of insulin ([place]) Educated on importance of glycemic control. Currently on Arni, statin therapy, and Farxiga. Currently managed by primary care provider.   Current Outpatient Medications:    alendronate (FOSAMAX) 70 MG tablet, Take 1 tablet (70 mg total) by mouth once a week., Disp: 4 tablet, Rfl: 1   ALPRAZolam (XANAX) 0.25 MG tablet, Take 1 tablet (0.25 mg total) by mouth as directed. 1/2 tablet r twice daily or one tab at night for anxiety, Disp: 30 tablet, Rfl: 0   aspirin EC 81 MG tablet, Take 81 mg by mouth daily., Disp: , Rfl:    busPIRone (BUSPAR) 5 MG tablet, Take 5 mg by mouth 2 (two) times daily. As needed, Disp: , Rfl:    Calcium Carb-Cholecalciferol (CALCIUM 600 + D PO), Take 1 tablet by mouth daily. , Disp: , Rfl:    dapagliflozin propanediol (FARXIGA) 10 MG TABS tablet, Take 1 tablet (10 mg total) by mouth daily., Disp: 30 tablet, Rfl: 1   diclofenac Sodium (VOLTAREN) 1 % GEL, APPLY 2 G TOPICALLY FOUR TIMES DAILY., Disp: 300 g, Rfl: 0   ENTRESTO 97-103 MG, TAKE 1 TABLET BY MOUTH TWICE A DAY, Disp: 180 tablet, Rfl: 0   Evolocumab (REPATHA SURECLICK) 373 MG/ML SOAJ, Inject 140 mg into the skin every 14 (fourteen) days for 6 doses., Disp: 2 mL, Rfl: 3   ezetimibe (ZETIA) 10 MG tablet, Take 10 mg by mouth daily., Disp: , Rfl:    FLUoxetine (PROZAC) 10 MG capsule, Take 10 mg by mouth daily., Disp: , Rfl:    gabapentin (NEURONTIN) 100 MG capsule, Take 100 mg by mouth at bedtime., Disp: , Rfl:    glucose blood test strip, 1 each 3 (three) times daily. , Disp: , Rfl:    insulin aspart (NOVOLOG FLEXPEN) 100 UNIT/ML FlexPen, Inject into the skin., Disp: , Rfl:    insulin degludec (TRESIBA) 100 UNIT/ML FlexTouch Pen, Inject into the skin., Disp: , Rfl:    isosorbide dinitrate (ISORDIL) 30 MG tablet, TAKE 1 TABLET BY MOUTH 3 TIMES DAILY., Disp: 90 tablet, Rfl: 2   levothyroxine (SYNTHROID, LEVOTHROID) 50 MCG  tablet, Take 50 mcg by mouth daily before breakfast. , Disp: , Rfl:    Magnesium Oxide 500 MG TABS, TAKE 1 TABLET (500 MG TOTAL) BY MOUTH IN THE MORNING AND AT BEDTIME. (Patient taking differently: Take 500 mg by mouth in the morning, at noon, and at bedtime.), Disp: 180 tablet, Rfl: 1   methocarbamol (ROBAXIN) 500 MG tablet, Take 500 mg by mouth 4 (four) times daily., Disp: , Rfl:    metoprolol succinate (TOPROL-XL) 50 MG 24 hr tablet, TAKE 1 TABLET BY MOUTH DAILY. TAKE WITH OR IMMEDIATELY FOLLOWING A MEAL., Disp: 90 tablet, Rfl: 0   nitroGLYCERIN (NITROSTAT) 0.4 MG SL tablet, PLACE 1 TABLET UNDER THE TONGUE EVERY 5 MINUTES X 3 DOSES AS NEEDED FOR CHEST PAIN. (Patient taking differently: Place 0.4 mg under the tongue every 5 (five) minutes as needed for chest pain.), Disp: 25 tablet, Rfl: 2   ONETOUCH VERIO test strip, 1 each by Other route 3 (three)  times daily., Disp: , Rfl: 5   rosuvastatin (CRESTOR) 40 MG tablet, Take 1 tablet (40 mg total) by mouth at bedtime., Disp: 90 tablet, Rfl: 3   torsemide (DEMADEX) 20 MG tablet, Take 20 mg by mouth every other day., Disp: , Rfl:    vitamin B-12 (CYANOCOBALAMIN) 1000 MCG tablet, Take 1,000 mcg by mouth daily. , Disp: , Rfl:    Vitamin D, Ergocalciferol, (DRISDOL) 50000 units CAPS capsule, Take 50,000 Units by mouth every 7 (seven) days. Take on Thursdays, Disp: , Rfl:   Orders Placed This Encounter  Procedures   Pro b natriuretic peptide (BNP)   Magnesium   Basic metabolic panel   EKG [id number]   --Continue cardiac medications as reconciled in final medication list. --Return in about 9 weeks (around 10/29/2021) for Follow up, heart failure management., Lipid. Or sooner if needed. --Continue follow-up with your primary care physician regarding the management of your other chronic comorbid conditions.  Patient's questions and concerns were addressed to her satisfaction. She voices understanding of the instructions provided during this encounter.    This note was created using a voice recognition software as a result there may be grammatical errors inadvertently enclosed that do not reflect the nature of this encounter. Every attempt is made to correct such errors.  Total time spent: 32 minutes.  Rex Kras, [place], [facility]  Pager: [phone] Office: [phone]

## 2021-08-28 ENCOUNTER — Encounter (INDEPENDENT_AMBULATORY_CARE_PROVIDER_SITE_OTHER): Payer: Self-pay | Admitting: Ophthalmology

## 2021-08-28 ENCOUNTER — Ambulatory Visit (INDEPENDENT_AMBULATORY_CARE_PROVIDER_SITE_OTHER): Payer: Medicare PPO | Admitting: Ophthalmology

## 2021-08-28 DIAGNOSIS — E113411 Type 2 diabetes mellitus with severe nonproliferative diabetic retinopathy with macular edema, right eye: Secondary | ICD-10-CM | POA: Diagnosis not present

## 2021-08-28 DIAGNOSIS — E113413 Type 2 diabetes mellitus with severe nonproliferative diabetic retinopathy with macular edema, bilateral: Secondary | ICD-10-CM | POA: Diagnosis not present

## 2021-08-28 DIAGNOSIS — E113412 Type 2 diabetes mellitus with severe nonproliferative diabetic retinopathy with macular edema, left eye: Secondary | ICD-10-CM

## 2021-08-28 LAB — BASIC METABOLIC PANEL
BUN/Creatinine Ratio: 29 — ABNORMAL HIGH (ref 12–28)
BUN: 40 mg/dL — ABNORMAL HIGH (ref 8–27)
CO2: 23 mmol/L (ref 20–29)
Calcium: 10 mg/dL (ref 8.7–10.3)
Chloride: 105 mmol/L (ref 96–106)
Creatinine, Ser: 1.37 mg/dL — ABNORMAL HIGH (ref 0.57–1.00)
Glucose: 153 mg/dL — ABNORMAL HIGH (ref 70–99)
Potassium: 4.1 mmol/L (ref 3.5–5.2)
Sodium: 143 mmol/L (ref 134–144)
eGFR: 39 mL/min/{1.73_m2} — ABNORMAL LOW (ref >59)

## 2021-08-28 LAB — PRO B NATRIURETIC PEPTIDE: NT-Pro BNP: 3833 pg/mL — ABNORMAL HIGH (ref 0–738)

## 2021-08-28 LAB — MAGNESIUM: Magnesium: 1.8 mg/dL (ref 1.6–2.3)

## 2021-08-28 MED ORDER — BEVACIZUMAB 2.5 MG/0.1ML IZ SOSY
2.5000 mg | PREFILLED_SYRINGE | INTRAVITREAL | Status: AC | PRN
  Administered 2021-08-28: 2.5 mg via INTRAVITREAL

## 2021-08-28 NOTE — Assessment & Plan Note (Signed)
Follow-up OS as scheduled 

## 2021-08-28 NOTE — Assessment & Plan Note (Signed)
Chronic CSME OD.  Resistant to Avastin injections x2, will repeat today would likely need to change medications to [facility] next OD

## 2021-08-28 NOTE — Progress Notes (Signed)
08/28/2021     CHIEF COMPLAINT Patient presents for  Chief Complaint  Patient presents with   Retina Follow Up      HISTORY OF PRESENT ILLNESS: Sara Graves is a 80 y.o. female who presents to the clinic today for:   HPI     Retina Follow Up   Patient presents with  Diabetic Retinopathy.  In right eye.  This started 6 weeks ago.  Severity is mild.  Duration of 6 weeks.  Since onset it is stable.        Comments   6 week fu OD oct avastin OD. Patient states vision is stable and unchanged since last visit. Denies any new floaters or FOL. Pt states her eyes have been excessively tearing recently. Pt states "it started about a week and a half ago. When I wake up in the morning my eyes are stuck together. I have not been using any eye drops."  LBS: 130 A1C: 6.0      Last edited by Laurin Coder on 08/28/2021  9:40 AM.      Referring physician: [name], PA-C [place],  [place] [id number]  HISTORICAL INFORMATION:   Selected notes from the MEDICAL RECORD NUMBER    Lab Results  Component Value Date   HGBA1C 9.8 (H) 02/12/2021     CURRENT MEDICATIONS: No current outpatient medications on file. (Ophthalmic Drugs)   No current facility-administered medications for this visit. (Ophthalmic Drugs)   Current Outpatient Medications (Other)  Medication Sig   alendronate (FOSAMAX) 70 MG tablet Take 1 tablet (70 mg total) by mouth once a week.   ALPRAZolam (XANAX) 0.25 MG tablet Take 1 tablet (0.25 mg total) by mouth as directed. 1/2 tablet r twice daily or one tab at night for anxiety   aspirin EC 81 MG tablet Take 81 mg by mouth daily.   busPIRone (BUSPAR) 5 MG tablet Take 5 mg by mouth 2 (two) times daily. As needed   Calcium Carb-Cholecalciferol (CALCIUM 600 + D PO) Take 1 tablet by mouth daily.    dapagliflozin propanediol (FARXIGA) 10 MG TABS tablet Take 1 tablet (10 mg total) by mouth daily.   diclofenac Sodium (VOLTAREN) 1 % GEL APPLY 2  G TOPICALLY FOUR TIMES DAILY.   ENTRESTO 97-103 MG TAKE 1 TABLET BY MOUTH TWICE A DAY   Evolocumab (REPATHA SURECLICK) 419 MG/ML SOAJ Inject 140 mg into the skin every 14 (fourteen) days for 6 doses.   ezetimibe (ZETIA) 10 MG tablet Take 10 mg by mouth daily.   FLUoxetine (PROZAC) 10 MG capsule Take 10 mg by mouth daily.   gabapentin (NEURONTIN) 100 MG capsule Take 100 mg by mouth at bedtime.   glucose blood test strip 1 each 3 (three) times daily.    insulin aspart (NOVOLOG FLEXPEN) 100 UNIT/ML FlexPen Inject into the skin.   insulin degludec (TRESIBA) 100 UNIT/ML FlexTouch Pen Inject into the skin.   isosorbide dinitrate (ISORDIL) 30 MG tablet TAKE 1 TABLET BY MOUTH 3 TIMES DAILY.   levothyroxine (SYNTHROID, LEVOTHROID) 50 MCG tablet Take 50 mcg by mouth daily before breakfast.    Magnesium Oxide 500 MG TABS TAKE 1 TABLET (500 MG TOTAL) BY MOUTH IN THE MORNING AND AT BEDTIME. (Patient taking differently: Take 500 mg by mouth in the morning, at noon, and at bedtime.)   methocarbamol (ROBAXIN) 500 MG tablet Take 500 mg by mouth 4 (four) times daily.   metoprolol succinate (TOPROL-XL) 50 MG 24  hr tablet TAKE 1 TABLET BY MOUTH DAILY. TAKE WITH OR IMMEDIATELY FOLLOWING A MEAL.   nitroGLYCERIN (NITROSTAT) 0.4 MG SL tablet PLACE 1 TABLET UNDER THE TONGUE EVERY 5 MINUTES X 3 DOSES AS NEEDED FOR CHEST PAIN. (Patient taking differently: Place 0.4 mg under the tongue every 5 (five) minutes as needed for chest pain.)   ONETOUCH VERIO test strip 1 each by Other route 3 (three) times daily.   rosuvastatin (CRESTOR) 40 MG tablet Take 1 tablet (40 mg total) by mouth at bedtime.   torsemide (DEMADEX) 20 MG tablet Take 20 mg by mouth every other day.   vitamin B-12 (CYANOCOBALAMIN) 1000 MCG tablet Take 1,000 mcg by mouth daily.    Vitamin D, Ergocalciferol, (DRISDOL) 50000 units CAPS capsule Take 50,000 Units by mouth every 7 (seven) days. Take on Thursdays   No current facility-administered medications for  this visit. (Other)      REVIEW OF SYSTEMS:    ALLERGIES Allergies  Allergen Reactions   Rosiglitazone Maleate Anaphylaxis and Swelling   Morphine Other (See Comments)    SEVERE HYPOTENSION    Cephalexin Diarrhea   Sulfa Antibiotics Diarrhea and Nausea And Vomiting   Tramadol Other (See Comments)    Unknown reaction   Elavil [Amitriptyline] Nausea Only   Tramadol-Acetaminophen Rash    PAST MEDICAL HISTORY Past Medical History:  Diagnosis Date   Acute combined systolic and diastolic heart failure ([place]) 04/25/2018   Anxiety    Arthritis    "hands" (12/29/2017)   Basal cell carcinoma of nose    removed   Bursitis of left shoulder    BV ICD Med[employer] [place] MRI CRTD 06/21/2019   Cat scratch fever    Late 90s   Chronic combined systolic and diastolic CHF (congestive heart failure) ([place]) 06/08/2019   Coronary artery disease    Depression    Diabetes mellitus, type II, insulin dependent ([place])    Encounter for assessment of implantable cardioverter-defibrillator (ICD) 09/28/2019   GERD (gastroesophageal reflux disease)    H. pylori infection 2008 and 1998   treated   Hyperlipidemia    Hypertension    Hypothyroidism    Low oxygen saturation    Macular degeneration    Multinodular goiter    Rotator cuff tear, left recurrent    Urge urinary incontinence    UTI (lower urinary tract infection) 05/2016   Past Surgical History:  Procedure Laterality Date   APPENDECTOMY  AGE 59   BACK SURGERY     BASAL CELL CARCINOMA EXCISION     "nose"   BIV ICD INSERTION CRT-D N/A 06/21/2019   Procedure: BIV ICD INSERTION CRT-D;  Surgeon: Evans Lance, MD;  Location: [facility];  Service: Cardiovascular;  Laterality: N/A;   BLADDER NECK SUSPENSION  1970's   BUNIONECTOMY WITH HAMMERTOE RECONSTRUCTION Bilateral    CARPAL TUNNEL RELEASE Right 05/2017   COLONOSCOPY W/ POLYPECTOMY     CORONARY ANGIOPLASTY WITH STENT PLACEMENT  12/29/2017   CORONARY BALLOON ANGIOPLASTY N/A  02/08/2019   Procedure: CORONARY BALLOON ANGIOPLASTY;  Surgeon: Adrian Prows, MD;  Location: [facility];  Service: Cardiovascular;  Laterality: N/A;   CORONARY STENT INTERVENTION N/A 12/29/2017   Procedure: CORONARY STENT INTERVENTION;  Surgeon: Nigel Mormon, MD;  Location: [facility];  Service: Cardiovascular;  Laterality: N/A;   CORONARY STENT INTERVENTION N/A 04/26/2018   Procedure: CORONARY STENT INTERVENTION;  Surgeon: Nigel Mormon, MD;  Location: [facility];  Service: Cardiovascular;  Laterality:  N/A;   CORONARY STENT INTERVENTION N/A 02/08/2019   Procedure: CORONARY STENT INTERVENTION;  Surgeon: Adrian Prows, MD;  Location: [facility];  Service: Cardiovascular;  Laterality: N/A;   ESOPHAGOGASTRODUODENOSCOPY ENDOSCOPY     FORAMINAL DECOMPRESSION AT L2 TO THE SACRUM  01-05-2008   L2  -  S1   GANGLION CYST EXCISION Left 01/17/2009   ring finger   IMPLANTATION PERMANENT SPINAL CORD STIMULATOR  06-15-2008   JUNE 2013--  BATTERY CHANGE   JOINT REPLACEMENT     LEFT HEART CATH AND CORONARY ANGIOGRAPHY N/A 04/26/2018   Procedure: LEFT HEART CATH AND CORONARY ANGIOGRAPHY;  Surgeon: Nigel Mormon, MD;  Location: [facility];  Service: Cardiovascular;  Laterality: N/A;   LEFT HEART CATH AND CORONARY ANGIOGRAPHY N/A 02/08/2019   Procedure: LEFT HEART CATH AND CORONARY ANGIOGRAPHY;  Surgeon: Adrian Prows, MD;  Location: [facility];  Service: Cardiovascular;  Laterality: N/A;   LIPOMA EXCISION Right    RIGHT ELBOW   METATARSAL HEAD EXCISION Right 06/16/2018   Procedure: right 5th metatarsal excision, a mini c-arm;  Surgeon: Trula Slade, DPM;  Location: [facility];  Service: Podiatry;  Laterality: Right;  anesthesia can do block   RIGHT/LEFT HEART CATH AND CORONARY ANGIOGRAPHY N/A 12/29/2017   Procedure: RIGHT/LEFT HEART CATH AND CORONARY ANGIOGRAPHY;  Surgeon: Nigel Mormon, MD;  Location: [facility];  Service: Cardiovascular;   Laterality: N/A;   SHOULDER OPEN ROTATOR CUFF REPAIR  09/07/2012   Procedure: ROTATOR CUFF REPAIR SHOULDER OPEN;  Surgeon: Magnus Sinning, MD;  Location: [facility];  Service: Orthopedics;  Laterality: Left;  OPEN ANTERIOR ACROMIONECTOMY AND ROTATOR CUFF REPAIR ON LEFT    SHOULDER OPEN ROTATOR CUFF REPAIR Left 12/28/2012   Procedure: ROTATOR CUFF REPAIR SHOULDER OPEN;  Surgeon: Magnus Sinning, MD;  Location: [facility];  Service: Orthopedics;  Laterality: Left;  OPEN SHOULDER ROTATOR CUFF REPAIR ON LEFT  WITH ANTERIOR ACROMINECTOMY    SHOULDER OPEN ROTATOR CUFF REPAIR Right 03/28/2003   RIGHT SHOULDER  DEGENERATIVE AC JOINT AND RC TEAR   SPINAL CORD STIMULATOR BATTERY EXCHANGE N/A 07/03/2016   Procedure: SPINAL CORD STIMULATOR BATTERY PLACMENT;  Surgeon: Melina Schools, MD;  Location: [facility];  Service: Orthopedics;  Laterality: N/A;   TONSILLECTOMY  AGE 89   TOTAL KNEE ARTHROPLASTY Right 05/06/2000   OA RIGHT KNEE   VAGINAL HYSTERECTOMY  1970's    FAMILY HISTORY Family History  Problem Relation Age of Onset   Cancer Mother        breast   Heart Problems Mother    Prostate cancer Father    Muscular dystrophy Son    Lung cancer Son    Cancer Maternal Grandmother        breast   Heart disease Maternal Grandfather    Breast cancer Sister     SOCIAL HISTORY Social History   Tobacco Use   Smoking status: Never   Smokeless tobacco: Never  Vaping Use   Vaping Use: Never used  Substance Use Topics   Alcohol use: No   Drug use: No         OPHTHALMIC EXAM:  Base Eye Exam     Visual Acuity (ETDRS)       Right Left   Dist [place] 20/30 -2 20/50 -2   Dist ph [place]  NI         Tonometry (Tonopen, 9:37 AM)       Right Left  Pressure 13 13         Pupils       Pupils Dark Light APD   Right PERRL 4 3 None   Left PERRL 4 3 None         Extraocular Movement       Right Left    Full Full         Neuro/Psych     Oriented x3:  Yes   Mood/Affect: Normal         Dilation     Right eye: 1.0% Mydriacyl, 2.5% Phenylephrine @ 9:37 AM           Slit Lamp and Fundus Exam     External Exam       Right Left   External Normal Normal         Slit Lamp Exam       Right Left   Lids/Lashes Normal Normal   Conjunctiva/Sclera White and quiet White and quiet   Cornea Clear Clear   Anterior Chamber Deep and quiet Deep and quiet   Iris Round and reactive Round and reactive   Lens Posterior chamber intraocular lens Posterior chamber intraocular lens   Anterior Vitreous Normal Normal         Fundus Exam       Right Left   Posterior Vitreous Normal    Disc Normal    C/D Ratio 0.3    Macula Microaneurysms, Macular thickening, Moderate clinically significant macular edema, center involved    Vessels NPDR-Severe    Periphery Normal             IMAGING AND PROCEDURES  Imaging and Procedures for 08/28/21  Intravitreal Injection, Pharmacologic Agent - OD - Right Eye       Time Out 08/28/2021. 10:48 AM. Confirmed correct patient, procedure, site, and patient consented.   Anesthesia Topical anesthesia was used. Anesthetic medications included Ak[place] 3.5%.   Procedure Preparation included Tobramycin 0.3%, 10% betadine to eyelids, 5% betadine to ocular surface. A 30 gauge needle was used.   Injection: 2.5 mg bevacizumab 2.5 MG/0.1ML   Route: Intravitreal, Site: Right Eye   NDC: [phone], Lot: [id number]   Post-op Post injection exam found visual acuity of at least counting fingers. The patient tolerated the procedure well. There were no complications. The patient received written and verbal post procedure care education. Post injection medications included ocuflox.      OCT, Retina - OU - Both Eyes       Right Eye Quality was good. Scan locations included subfoveal. Central Foveal Thickness: 639. Progression has worsened. Findings include abnormal foveal contour.   Left Eye Quality was  good. Scan locations included extrafoveal. Central Foveal Thickness: 577. Progression has worsened. Findings include abnormal foveal contour.   Notes CSME OU, with center involvement OD, now 6 weeks post recent Avastin OD improved.  And post recent PRP #1 for peripheral nonperfusion  OS increased CSME still active OS at 8 week postinjection               ASSESSMENT/PLAN:  Severe nonproliferative diabetic retinopathy of right eye, with macular edema, associated with type 2 diabetes mellitus (HCC) Chronic CSME OD.  Resistant to Avastin injections x2, will repeat today would likely need to change medications to [facility] next OD  Severe nonproliferative diabetic retinopathy of left eye, with macular edema, associated with type 2 diabetes mellitus ([place]) Follow-up OS as scheduled     ICD-10-CM   1. Severe nonproliferative diabetic  retinopathy of right eye, with macular edema, associated with type 2 diabetes mellitus (HCC)  E11.3411 Intravitreal Injection, Pharmacologic Agent - OD - Right Eye    OCT, Retina - OU - Both Eyes    bevacizumab (AVASTIN) SOSY 2.5 mg    2. Severe nonproliferative diabetic retinopathy of left eye, with macular edema, associated with type 2 diabetes mellitus ([place])  J33.5456       1.  OD, chronic active CSME with little to no improvement on Avastin and post PRP #1 to retinal nonperfusion.  We will repeat Avastin today and likely need to change to [facility] next in 5-week  2.  OS also with chronic progressive CSME will need to schedule follow-up in 2 weeks for Avastin OS and likely if no improvement may change this medication as well  3.  Ophthalmic Meds Ordered this visit:  Meds ordered this encounter  Medications   bevacizumab (AVASTIN) SOSY 2.5 mg       Return in about 2 weeks (around 09/11/2021) for  OS, AVASTIN OCT,, and 5 weeks dilate OD Eylea OCT.  There are no Patient Instructions on file for this visit.   Explained the diagnoses, plan, and follow  up with the patient and they expressed understanding.  Patient expressed understanding of the importance of proper follow up care.   Clent Demark Lizzie Cokley M.D. Diseases & Surgery of the Retina and Vitreous Retina & Diabetic [place] 08/28/21     Abbreviations: M myopia (nearsighted); A astigmatism; H hyperopia (farsighted); P presbyopia; Mrx spectacle prescription;  CTL contact lenses; OD right eye; OS left eye; OU both eyes  XT exotropia; ET esotropia; PEK punctate epithelial keratitis; PEE punctate epithelial erosions; DES dry eye syndrome; MGD meibomian gland dysfunction; ATs artificial tears; PFAT's preservative free artificial tears; [place] nuclear sclerotic cataract; PSC posterior subcapsular cataract; ERM epi-retinal membrane; PVD posterior vitreous detachment; RD retinal detachment; DM diabetes mellitus; DR diabetic retinopathy; NPDR non-proliferative diabetic retinopathy; PDR proliferative diabetic retinopathy; CSME clinically significant macular edema; DME diabetic macular edema; dbh dot blot hemorrhages; CWS cotton wool spot; POAG primary open angle glaucoma; C/D cup-to-disc ratio; HVF humphrey visual field; GVF goldmann visual field; OCT optical coherence tomography; IOP intraocular pressure; BRVO Branch retinal vein occlusion; CRVO central retinal vein occlusion; CRAO central retinal artery occlusion; BRAO branch retinal artery occlusion; RT retinal tear; SB scleral buckle; PPV pars plana vitrectomy; VH Vitreous hemorrhage; PRP panretinal laser photocoagulation; IVK intravitreal kenalog; VMT vitreomacular traction; MH Macular hole;  NVD neovascularization of the disc; NVE neovascularization elsewhere; AREDS age related eye disease study; ARMD age related macular degeneration; POAG primary open angle glaucoma; EBMD epithelial/anterior basement membrane dystrophy; ACIOL anterior chamber intraocular lens; IOL intraocular lens; PCIOL posterior chamber intraocular lens; Phaco/IOL phacoemulsification with  intraocular lens placement; [place] photorefractive keratectomy; LASIK laser assisted in situ keratomileusis; HTN hypertension; DM diabetes mellitus; COPD chronic obstructive pulmonary disease

## 2021-09-02 ENCOUNTER — Other Ambulatory Visit: Payer: Self-pay | Admitting: Pharmacist

## 2021-09-02 ENCOUNTER — Other Ambulatory Visit: Payer: Self-pay | Admitting: Cardiology

## 2021-09-02 DIAGNOSIS — I5042 Chronic combined systolic (congestive) and diastolic (congestive) heart failure: Secondary | ICD-10-CM

## 2021-09-02 DIAGNOSIS — I255 Ischemic cardiomyopathy: Secondary | ICD-10-CM

## 2021-09-02 DIAGNOSIS — I5041 Acute combined systolic (congestive) and diastolic (congestive) heart failure: Secondary | ICD-10-CM

## 2021-09-02 NOTE — Progress Notes (Signed)
See result note above

## 2021-09-02 NOTE — Progress Notes (Signed)
Reviewed lab results with pt. Pt continues to deny any lower extremity swelling, SOB, DOE. Confirmed that pt has been taking her Entresto 97/103 mg - half tab BID. Pt reports that she has been checking her weights daily at home. Recently stopped spironolactone due to hyperkalemia concerns. Pt agreeable to start taking full tab of entresto 97/103 mg BID starting today. Pt aware to get repeat labs in 1 week. Lab orders pended.

## 2021-09-03 ENCOUNTER — Other Ambulatory Visit: Payer: Self-pay | Admitting: Cardiology

## 2021-09-03 DIAGNOSIS — I5041 Acute combined systolic (congestive) and diastolic (congestive) heart failure: Secondary | ICD-10-CM

## 2021-09-05 ENCOUNTER — Other Ambulatory Visit: Payer: Self-pay

## 2021-09-11 ENCOUNTER — Encounter (INDEPENDENT_AMBULATORY_CARE_PROVIDER_SITE_OTHER): Payer: Medicare PPO | Admitting: Ophthalmology

## 2021-09-11 ENCOUNTER — Ambulatory Visit (INDEPENDENT_AMBULATORY_CARE_PROVIDER_SITE_OTHER): Payer: Medicare PPO | Admitting: Ophthalmology

## 2021-09-11 ENCOUNTER — Other Ambulatory Visit: Payer: Self-pay

## 2021-09-11 ENCOUNTER — Encounter (INDEPENDENT_AMBULATORY_CARE_PROVIDER_SITE_OTHER): Payer: Self-pay | Admitting: Ophthalmology

## 2021-09-11 DIAGNOSIS — E113413 Type 2 diabetes mellitus with severe nonproliferative diabetic retinopathy with macular edema, bilateral: Secondary | ICD-10-CM

## 2021-09-11 DIAGNOSIS — E113412 Type 2 diabetes mellitus with severe nonproliferative diabetic retinopathy with macular edema, left eye: Secondary | ICD-10-CM | POA: Diagnosis not present

## 2021-09-11 DIAGNOSIS — E113411 Type 2 diabetes mellitus with severe nonproliferative diabetic retinopathy with macular edema, right eye: Secondary | ICD-10-CM | POA: Diagnosis not present

## 2021-09-11 MED ORDER — BEVACIZUMAB 2.5 MG/0.1ML IZ SOSY
2.5000 mg | PREFILLED_SYRINGE | INTRAVITREAL | Status: AC | PRN
  Administered 2021-09-11: 2.5 mg via INTRAVITREAL

## 2021-09-11 NOTE — Progress Notes (Signed)
09/11/2021     CHIEF COMPLAINT Patient presents for  Chief Complaint  Patient presents with   Retina Follow Up      HISTORY OF PRESENT ILLNESS: Sara Graves is a 80 y.o. female who presents to the clinic today for:   HPI     Retina Follow Up   Patient presents with  Diabetic Retinopathy.  In left eye.  This started 10 weeks ago.  Severity is mild.  Duration of 10 weeks.  Since onset it is stable.        Comments   10 week fu OS oct avastin OS. (OD was done 2 weeks ago.) Pt states "For about a week now my eyes have been watering badly and nose has been running, I guess it is just allergies." Pt wants to add she started injections of Repatha (evolocumab) on Sunday for her heart, injection every two weeks.      Last edited by Laurin Coder on 09/11/2021 10:07 AM.      Referring physician: [name], PA-C [place],  [place] [id number]  HISTORICAL INFORMATION:   Selected notes from the MEDICAL RECORD NUMBER    Lab Results  Component Value Date   HGBA1C 9.8 (H) 02/12/2021     CURRENT MEDICATIONS: No current outpatient medications on file. (Ophthalmic Drugs)   No current facility-administered medications for this visit. (Ophthalmic Drugs)   Current Outpatient Medications (Other)  Medication Sig   alendronate (FOSAMAX) 70 MG tablet Take 1 tablet (70 mg total) by mouth once a week.   ALPRAZolam (XANAX) 0.25 MG tablet Take 1 tablet (0.25 mg total) by mouth as directed. 1/2 tablet r twice daily or one tab at night for anxiety   aspirin EC 81 MG tablet Take 81 mg by mouth daily.   busPIRone (BUSPAR) 5 MG tablet Take 5 mg by mouth 2 (two) times daily. As needed   Calcium Carb-Cholecalciferol (CALCIUM 600 + D PO) Take 1 tablet by mouth daily.    dapagliflozin propanediol (FARXIGA) 10 MG TABS tablet Take 1 tablet (10 mg total) by mouth daily.   diclofenac Sodium (VOLTAREN) 1 % GEL APPLY 2 G TOPICALLY FOUR TIMES DAILY.   ENTRESTO 97-103 MG  TAKE 1 TABLET BY MOUTH TWICE A DAY   Evolocumab (REPATHA SURECLICK) 657 MG/ML SOAJ Inject 140 mg into the skin every 14 (fourteen) days for 6 doses.   ezetimibe (ZETIA) 10 MG tablet Take 10 mg by mouth daily.   FLUoxetine (PROZAC) 10 MG capsule Take 10 mg by mouth daily.   gabapentin (NEURONTIN) 100 MG capsule Take 100 mg by mouth at bedtime.   glucose blood test strip 1 each 3 (three) times daily.    insulin aspart (NOVOLOG FLEXPEN) 100 UNIT/ML FlexPen Inject into the skin.   insulin degludec (TRESIBA) 100 UNIT/ML FlexTouch Pen Inject into the skin.   isosorbide dinitrate (ISORDIL) 30 MG tablet TAKE 1 TABLET BY MOUTH 3 TIMES DAILY.   levothyroxine (SYNTHROID, LEVOTHROID) 50 MCG tablet Take 50 mcg by mouth daily before breakfast.    Magnesium Oxide 500 MG TABS TAKE 1 TABLET (500 MG TOTAL) BY MOUTH IN THE MORNING AND AT BEDTIME. (Patient taking differently: Take 500 mg by mouth in the morning, at noon, and at bedtime.)   methocarbamol (ROBAXIN) 500 MG tablet Take 500 mg by mouth 4 (four) times daily.   metoprolol succinate (TOPROL-XL) 50 MG 24 hr tablet TAKE 1 TABLET BY MOUTH EVERY DAY WITH OR IMMEDIATELY FOLLOWING  A MEAL   nitroGLYCERIN (NITROSTAT) 0.4 MG SL tablet PLACE 1 TABLET UNDER THE TONGUE EVERY 5 MINUTES X 3 DOSES AS NEEDED FOR CHEST PAIN. (Patient taking differently: Place 0.4 mg under the tongue every 5 (five) minutes as needed for chest pain.)   ONETOUCH VERIO test strip 1 each by Other route 3 (three) times daily.   rosuvastatin (CRESTOR) 40 MG tablet Take 1 tablet (40 mg total) by mouth at bedtime.   torsemide (DEMADEX) 20 MG tablet Take 20 mg by mouth every other day.   vitamin B-12 (CYANOCOBALAMIN) 1000 MCG tablet Take 1,000 mcg by mouth daily.    Vitamin D, Ergocalciferol, (DRISDOL) 50000 units CAPS capsule Take 50,000 Units by mouth every 7 (seven) days. Take on Thursdays   No current facility-administered medications for this visit. (Other)      REVIEW OF  SYSTEMS:    ALLERGIES Allergies  Allergen Reactions   Rosiglitazone Maleate Anaphylaxis and Swelling   Morphine Other (See Comments)    SEVERE HYPOTENSION    Cephalexin Diarrhea   Sulfa Antibiotics Diarrhea and Nausea And Vomiting   Tramadol Other (See Comments)    Unknown reaction   Elavil [Amitriptyline] Nausea Only   Tramadol-Acetaminophen Rash    PAST MEDICAL HISTORY Past Medical History:  Diagnosis Date   Acute combined systolic and diastolic heart failure ([facility]) 04/25/2018   Anxiety    Arthritis    "hands" (12/29/2017)   Basal cell carcinoma of nose    removed   Bursitis of left shoulder    BV ICD Medtronic [place] MRI CRTD 06/21/2019   Cat scratch fever    Late 90s   Chronic combined systolic and diastolic CHF (congestive heart failure) ([facility]) 06/08/2019   Coronary artery disease    Depression    Diabetes mellitus, type II, insulin dependent ([place])    Encounter for assessment of implantable cardioverter-defibrillator (ICD) 09/28/2019   GERD (gastroesophageal reflux disease)    H. pylori infection 2008 and 1998   treated   Hyperlipidemia    Hypertension    Hypothyroidism    Low oxygen saturation    Macular degeneration    Multinodular goiter    Rotator cuff tear, left recurrent    Urge urinary incontinence    UTI (lower urinary tract infection) 05/2016   Past Surgical History:  Procedure Laterality Date   APPENDECTOMY  AGE 77   BACK SURGERY     BASAL CELL CARCINOMA EXCISION     "nose"   BIV ICD INSERTION CRT-D N/A 06/21/2019   Procedure: BIV ICD INSERTION CRT-D;  Surgeon: Evans Lance, MD;  Location: [facility];  Service: Cardiovascular;  Laterality: N/A;   BLADDER NECK SUSPENSION  1970's   BUNIONECTOMY WITH HAMMERTOE RECONSTRUCTION Bilateral    CARPAL TUNNEL RELEASE Right 05/2017   COLONOSCOPY W/ POLYPECTOMY     CORONARY ANGIOPLASTY WITH STENT PLACEMENT  12/29/2017   CORONARY BALLOON ANGIOPLASTY N/A 02/08/2019   Procedure: CORONARY BALLOON  ANGIOPLASTY;  Surgeon: Adrian Prows, MD;  Location: [facility];  Service: Cardiovascular;  Laterality: N/A;   CORONARY STENT INTERVENTION N/A 12/29/2017   Procedure: CORONARY STENT INTERVENTION;  Surgeon: Nigel Mormon, MD;  Location: [facility];  Service: Cardiovascular;  Laterality: N/A;   CORONARY STENT INTERVENTION N/A 04/26/2018   Procedure: CORONARY STENT INTERVENTION;  Surgeon: Nigel Mormon, MD;  Location: [facility];  Service: Cardiovascular;  Laterality: N/A;   CORONARY STENT INTERVENTION N/A 02/08/2019   Procedure: CORONARY STENT  INTERVENTION;  Surgeon: Adrian Prows, MD;  Location: [facility];  Service: Cardiovascular;  Laterality: N/A;   ESOPHAGOGASTRODUODENOSCOPY ENDOSCOPY     FORAMINAL DECOMPRESSION AT L2 TO THE SACRUM  01-05-2008   L2  -  S1   GANGLION CYST EXCISION Left 01/17/2009   ring finger   IMPLANTATION PERMANENT SPINAL CORD STIMULATOR  06-15-2008   JUNE 2013--  BATTERY CHANGE   JOINT REPLACEMENT     LEFT HEART CATH AND CORONARY ANGIOGRAPHY N/A 04/26/2018   Procedure: LEFT HEART CATH AND CORONARY ANGIOGRAPHY;  Surgeon: Nigel Mormon, MD;  Location: [facility];  Service: Cardiovascular;  Laterality: N/A;   LEFT HEART CATH AND CORONARY ANGIOGRAPHY N/A 02/08/2019   Procedure: LEFT HEART CATH AND CORONARY ANGIOGRAPHY;  Surgeon: Adrian Prows, MD;  Location: [facility];  Service: Cardiovascular;  Laterality: N/A;   LIPOMA EXCISION Right    RIGHT ELBOW   METATARSAL HEAD EXCISION Right 06/16/2018   Procedure: right 5th metatarsal excision, a mini c-arm;  Surgeon: Trula Slade, DPM;  Location: [facility];  Service: Podiatry;  Laterality: Right;  anesthesia can do block   RIGHT/LEFT HEART CATH AND CORONARY ANGIOGRAPHY N/A 12/29/2017   Procedure: RIGHT/LEFT HEART CATH AND CORONARY ANGIOGRAPHY;  Surgeon: Nigel Mormon, MD;  Location: [facility];  Service: Cardiovascular;  Laterality: N/A;   SHOULDER OPEN  ROTATOR CUFF REPAIR  09/07/2012   Procedure: ROTATOR CUFF REPAIR SHOULDER OPEN;  Surgeon: Magnus Sinning, MD;  Location: [facility];  Service: Orthopedics;  Laterality: Left;  OPEN ANTERIOR ACROMIONECTOMY AND ROTATOR CUFF REPAIR ON LEFT    SHOULDER OPEN ROTATOR CUFF REPAIR Left 12/28/2012   Procedure: ROTATOR CUFF REPAIR SHOULDER OPEN;  Surgeon: Magnus Sinning, MD;  Location: [facility];  Service: Orthopedics;  Laterality: Left;  OPEN SHOULDER ROTATOR CUFF REPAIR ON LEFT  WITH ANTERIOR ACROMINECTOMY    SHOULDER OPEN ROTATOR CUFF REPAIR Right 03/28/2003   RIGHT SHOULDER  DEGENERATIVE AC JOINT AND RC TEAR   SPINAL CORD STIMULATOR BATTERY EXCHANGE N/A 07/03/2016   Procedure: SPINAL CORD STIMULATOR BATTERY PLACMENT;  Surgeon: Melina Schools, MD;  Location: [facility];  Service: Orthopedics;  Laterality: N/A;   TONSILLECTOMY  AGE 68   TOTAL KNEE ARTHROPLASTY Right 05/06/2000   OA RIGHT KNEE   VAGINAL HYSTERECTOMY  1970's    FAMILY HISTORY Family History  Problem Relation Age of Onset   Cancer Mother        breast   Heart Problems Mother    Prostate cancer Father    Muscular dystrophy Son    Lung cancer Son    Cancer Maternal Grandmother        breast   Heart disease Maternal Grandfather    Breast cancer Sister     SOCIAL HISTORY Social History   Tobacco Use   Smoking status: Never   Smokeless tobacco: Never  Vaping Use   Vaping Use: Never used  Substance Use Topics   Alcohol use: No   Drug use: No         OPHTHALMIC EXAM:  Base Eye Exam     Visual Acuity (ETDRS)       Right Left   Dist [place] 20/40 +1 20/40   Dist ph [place] NI NI         Tonometry (Tonopen, 10:05 AM)       Right Left   Pressure 10 9         Pupils  Pupils Dark Light Shape React APD   Right PERRL 4 3 Round Brisk None   Left PERRL 4 3 Round Brisk None         Extraocular Movement       Right Left    Full Full         Neuro/Psych     Oriented x3:  Yes   Mood/Affect: Normal         Dilation     Left eye: 1.0% Mydriacyl, 2.5% Phenylephrine @ 10:05 AM           Slit Lamp and Fundus Exam     External Exam       Right Left   External Normal Normal         Slit Lamp Exam       Right Left   Lids/Lashes Normal Normal   Conjunctiva/Sclera White and quiet White and quiet   Cornea Clear Clear   Anterior Chamber Deep and quiet Deep and quiet   Iris Round and reactive Round and reactive   Lens Posterior chamber intraocular lens Posterior chamber intraocular lens   Anterior Vitreous Normal Normal         Fundus Exam       Right Left   Posterior Vitreous  Normal   Disc  Normal   C/D Ratio  0.3   Macula  Microaneurysms, Macular thickening, Moderate clinically significant macular edema, superotemporal to FAZ   Vessels  NPDR-Severe   Periphery  Normal            IMAGING AND PROCEDURES  Imaging and Procedures for 09/11/21  OCT, Retina - OU - Both Eyes       Right Eye Quality was good. Scan locations included subfoveal. Central Foveal Thickness: 575. Progression has worsened. Findings include abnormal foveal contour.   Left Eye Quality was good. Scan locations included extrafoveal. Central Foveal Thickness: 617. Progression has worsened. Findings include abnormal foveal contour.   Notes CSME OU, with center involvement OD, now 4 weeks post recent Avastin OD improved.   OS increased CSME still active OS at 10 week postinjection       Intravitreal Injection, Pharmacologic Agent - OS - Left Eye       Time Out 09/11/2021. 11:23 AM. Confirmed correct patient, procedure, site, and patient consented.   Anesthesia Topical anesthesia was used. Anesthetic medications included Lidocaine 4%.   Procedure Preparation included Tobramycin 0.3%, Ofloxacin , 5% betadine to ocular surface, 10% betadine to eyelids. A 30 gauge needle was used.   Injection: 2.5 mg bevacizumab 2.5 MG/0.1ML   Route: Intravitreal,  Site: Left Eye   NDC: [phone], Lot: [id number]   Post-op Post injection exam found visual acuity of at least counting fingers. The patient tolerated the procedure well. There were no complications. The patient received written and verbal post procedure care education. Post injection medications included ocuflox.              ASSESSMENT/PLAN:  Severe nonproliferative diabetic retinopathy of left eye, with macular edema, associated with type 2 diabetes mellitus ([facility]) Follow-up today left eye with CSME recurrent and extensive at 10-week interval.  We will need to repeat injection Avastin OS today and follow-up in 6 weeks  Severe nonproliferative diabetic retinopathy of right eye, with macular edema, associated with type 2 diabetes mellitus ([facility]) OD improved at 2 weeks post most recent injection     ICD-10-CM   1. Severe nonproliferative diabetic retinopathy of left eye, with macular edema,  associated with type 2 diabetes mellitus ([facility])  Y65.9935 OCT, Retina - OU - Both Eyes    Intravitreal Injection, Pharmacologic Agent - OS - Left Eye    bevacizumab (AVASTIN) SOSY 2.5 mg    2. Severe nonproliferative diabetic retinopathy of right eye, with macular edema, associated with type 2 diabetes mellitus ([facility])  E11.3411       1.  OS over improved overall CSME yet still active and at 10-week interval slightly increased from last visit.  We will repeat injection OS today Avastin and follow-up next in 6 weeks OS  2.  CME OD at 2 weeks post recent injection improved follow-up OD as scheduled  3.  Ophthalmic Meds Ordered this visit:  Meds ordered this encounter  Medications   bevacizumab (AVASTIN) SOSY 2.5 mg       Return in about 6 weeks (around 10/23/2021) for dilate, OS, AVASTIN OCT, and OD as scheduled.  There are no Patient Instructions on file for this visit.   Explained the diagnoses, plan, and follow up with the patient and they expressed understanding.  Patient expressed  understanding of the importance of proper follow up care.   Clent Demark Elbridge Magowan M.D. Diseases & Surgery of the Retina and Vitreous Retina & Diabetic [place] 09/11/21     Abbreviations: M myopia (nearsighted); A astigmatism; H hyperopia (farsighted); P presbyopia; Mrx spectacle prescription;  CTL contact lenses; OD right eye; OS left eye; OU both eyes  XT exotropia; ET esotropia; PEK punctate epithelial keratitis; PEE punctate epithelial erosions; DES dry eye syndrome; MGD meibomian gland dysfunction; ATs artificial tears; PFAT's preservative free artificial tears; [place] nuclear sclerotic cataract; PSC posterior subcapsular cataract; ERM epi-retinal membrane; PVD posterior vitreous detachment; RD retinal detachment; DM diabetes mellitus; DR diabetic retinopathy; NPDR non-proliferative diabetic retinopathy; PDR proliferative diabetic retinopathy; CSME clinically significant macular edema; DME diabetic macular edema; dbh dot blot hemorrhages; CWS cotton wool spot; POAG primary open angle glaucoma; C/D cup-to-disc ratio; HVF humphrey visual field; GVF goldmann visual field; OCT optical coherence tomography; IOP intraocular pressure; BRVO Branch retinal vein occlusion; CRVO central retinal vein occlusion; CRAO central retinal artery occlusion; BRAO branch retinal artery occlusion; RT retinal tear; SB scleral buckle; PPV pars plana vitrectomy; VH Vitreous hemorrhage; PRP panretinal laser photocoagulation; IVK intravitreal kenalog; VMT vitreomacular traction; MH Macular hole;  NVD neovascularization of the disc; NVE neovascularization elsewhere; AREDS age related eye disease study; ARMD age related macular degeneration; POAG primary open angle glaucoma; EBMD epithelial/anterior basement membrane dystrophy; ACIOL anterior chamber intraocular lens; IOL intraocular lens; PCIOL posterior chamber intraocular lens; Phaco/IOL phacoemulsification with intraocular lens placement; [place] photorefractive keratectomy; LASIK laser  assisted in situ keratomileusis; HTN hypertension; DM diabetes mellitus; COPD chronic obstructive pulmonary disease

## 2021-09-11 NOTE — Assessment & Plan Note (Signed)
Follow-up today left eye with CSME recurrent and extensive at 10-week interval.  We will need to repeat injection Avastin OS today and follow-up in 6 weeks

## 2021-09-11 NOTE — Assessment & Plan Note (Signed)
OD improved at 2 weeks post most recent injection

## 2021-10-02 ENCOUNTER — Ambulatory Visit (INDEPENDENT_AMBULATORY_CARE_PROVIDER_SITE_OTHER): Payer: Medicare PPO | Admitting: Ophthalmology

## 2021-10-02 ENCOUNTER — Other Ambulatory Visit: Payer: Self-pay

## 2021-10-02 ENCOUNTER — Encounter (INDEPENDENT_AMBULATORY_CARE_PROVIDER_SITE_OTHER): Payer: Self-pay | Admitting: Ophthalmology

## 2021-10-02 DIAGNOSIS — E113411 Type 2 diabetes mellitus with severe nonproliferative diabetic retinopathy with macular edema, right eye: Secondary | ICD-10-CM

## 2021-10-02 DIAGNOSIS — E113412 Type 2 diabetes mellitus with severe nonproliferative diabetic retinopathy with macular edema, left eye: Secondary | ICD-10-CM

## 2021-10-02 MED ORDER — BEVACIZUMAB 2.5 MG/0.1ML IZ SOSY
2.5000 mg | PREFILLED_SYRINGE | INTRAVITREAL | Status: AC | PRN
  Administered 2021-10-02: 2.5 mg via INTRAVITREAL

## 2021-10-02 NOTE — Progress Notes (Signed)
10/02/2021     CHIEF COMPLAINT Patient presents for  Chief Complaint  Patient presents with   Retina Follow Up      HISTORY OF PRESENT ILLNESS: Sara Graves is a 80 y.o. female who presents to the clinic today for:   HPI     Retina Follow Up   Patient presents with  Diabetic Retinopathy.  In right eye.  This started 5 weeks ago.  Severity is mild.  Duration of 5 weeks.  Since onset it is stable.        Comments   5 week fu OD oct Avastin OD. (Not approved [place]) Patient states vision is stable and unchanged since last visit. Denies any new floaters or FOL. Pt states she had pneumonia 3 weeks ago, is better now.       Last edited by Laurin Coder on 10/02/2021 10:29 AM.      Referring physician: [name], PA-C [place],  [place] [address]  HISTORICAL INFORMATION:   Selected notes from the MEDICAL RECORD NUMBER    Lab Results  Component Value Date   HGBA1C 9.8 (H) 02/12/2021     CURRENT MEDICATIONS: No current outpatient medications on file. (Ophthalmic Drugs)   No current facility-administered medications for this visit. (Ophthalmic Drugs)   Current Outpatient Medications (Other)  Medication Sig   alendronate (FOSAMAX) 70 MG tablet Take 1 tablet (70 mg total) by mouth once a week.   ALPRAZolam (XANAX) 0.25 MG tablet Take 1 tablet (0.25 mg total) by mouth as directed. 1/2 tablet r twice daily or one tab at night for anxiety   aspirin EC 81 MG tablet Take 81 mg by mouth daily.   busPIRone (BUSPAR) 5 MG tablet Take 5 mg by mouth 2 (two) times daily. As needed   Calcium Carb-Cholecalciferol (CALCIUM 600 + D PO) Take 1 tablet by mouth daily.    dapagliflozin propanediol (FARXIGA) 10 MG TABS tablet Take 1 tablet (10 mg total) by mouth daily.   diclofenac Sodium (VOLTAREN) 1 % GEL APPLY 2 G TOPICALLY FOUR TIMES DAILY.   ENTRESTO 97-103 MG TAKE 1 TABLET BY MOUTH TWICE A DAY   Evolocumab (REPATHA SURECLICK) 660 MG/ML SOAJ Inject 140  mg into the skin every 14 (fourteen) days for 6 doses.   ezetimibe (ZETIA) 10 MG tablet Take 10 mg by mouth daily.   FLUoxetine (PROZAC) 10 MG capsule Take 10 mg by mouth daily.   gabapentin (NEURONTIN) 100 MG capsule Take 100 mg by mouth at bedtime.   glucose blood test strip 1 each 3 (three) times daily.    insulin aspart (NOVOLOG FLEXPEN) 100 UNIT/ML FlexPen Inject into the skin.   insulin degludec (TRESIBA) 100 UNIT/ML FlexTouch Pen Inject into the skin.   isosorbide dinitrate (ISORDIL) 30 MG tablet TAKE 1 TABLET BY MOUTH 3 TIMES DAILY.   levothyroxine (SYNTHROID, LEVOTHROID) 50 MCG tablet Take 50 mcg by mouth daily before breakfast.    Magnesium Oxide 500 MG TABS TAKE 1 TABLET (500 MG TOTAL) BY MOUTH IN THE MORNING AND AT BEDTIME. (Patient taking differently: Take 500 mg by mouth in the morning, at noon, and at bedtime.)   methocarbamol (ROBAXIN) 500 MG tablet Take 500 mg by mouth 4 (four) times daily.   metoprolol succinate (TOPROL-XL) 50 MG 24 hr tablet TAKE 1 TABLET BY MOUTH EVERY DAY WITH OR IMMEDIATELY FOLLOWING A MEAL   nitroGLYCERIN (NITROSTAT) 0.4 MG SL tablet PLACE 1 TABLET UNDER THE TONGUE EVERY 5  MINUTES X 3 DOSES AS NEEDED FOR CHEST PAIN. (Patient taking differently: Place 0.4 mg under the tongue every 5 (five) minutes as needed for chest pain.)   ONETOUCH VERIO test strip 1 each by Other route 3 (three) times daily.   rosuvastatin (CRESTOR) 40 MG tablet Take 1 tablet (40 mg total) by mouth at bedtime.   torsemide (DEMADEX) 20 MG tablet Take 20 mg by mouth every other day.   vitamin B-12 (CYANOCOBALAMIN) 1000 MCG tablet Take 1,000 mcg by mouth daily.    Vitamin D, Ergocalciferol, (DRISDOL) 50000 units CAPS capsule Take 50,000 Units by mouth every 7 (seven) days. Take on Thursdays   No current facility-administered medications for this visit. (Other)      REVIEW OF SYSTEMS:    ALLERGIES Allergies  Allergen Reactions   Rosiglitazone Maleate Anaphylaxis and Swelling    Morphine Other (See Comments)    SEVERE HYPOTENSION    Cephalexin Diarrhea   Sulfa Antibiotics Diarrhea and Nausea And Vomiting   Tramadol Other (See Comments)    Unknown reaction   Elavil [Amitriptyline] Nausea Only   Tramadol-Acetaminophen Rash    PAST MEDICAL HISTORY Past Medical History:  Diagnosis Date   Acute combined systolic and diastolic heart failure ([place]) 04/25/2018   Anxiety    Arthritis    "hands" (12/29/2017)   Basal cell carcinoma of nose    removed   Bursitis of left shoulder    BV ICD Med[employer]onic [place] MRI CRTD 06/21/2019   Cat scratch fever    Late 90s   Chronic combined systolic and diastolic CHF (congestive heart failure) ([place]) 06/08/2019   Coronary artery disease    Depression    Diabetes mellitus, type II, insulin dependent ([place])    Encounter for assessment of implantable cardioverter-defibrillator (ICD) 09/28/2019   GERD (gastroesophageal reflux disease)    H. pylori infection 2008 and 1998   treated   Hyperlipidemia    Hypertension    Hypothyroidism    Low oxygen saturation    Macular degeneration    Multinodular goiter    Rotator cuff tear, left recurrent    Urge urinary incontinence    UTI (lower urinary tract infection) 05/2016   Past Surgical History:  Procedure Laterality Date   APPENDECTOMY  AGE 13   BACK SURGERY     BASAL CELL CARCINOMA EXCISION     "nose"   BIV ICD INSERTION CRT-D N/A 06/21/2019   Procedure: BIV ICD INSERTION CRT-D;  Surgeon: Evans Lance, MD;  Location: [facility];  Service: Cardiovascular;  Laterality: N/A;   BLADDER NECK SUSPENSION  1970's   BUNIONECTOMY WITH HAMMERTOE RECONSTRUCTION Bilateral    CARPAL TUNNEL RELEASE Right 05/2017   COLONOSCOPY W/ POLYPECTOMY     CORONARY ANGIOPLASTY WITH STENT PLACEMENT  12/29/2017   CORONARY BALLOON ANGIOPLASTY N/A 02/08/2019   Procedure: CORONARY BALLOON ANGIOPLASTY;  Surgeon: Adrian Prows, MD;  Location: [facility];  Service: Cardiovascular;  Laterality: N/A;    CORONARY STENT INTERVENTION N/A 12/29/2017   Procedure: CORONARY STENT INTERVENTION;  Surgeon: Nigel Mormon, MD;  Location: [facility];  Service: Cardiovascular;  Laterality: N/A;   CORONARY STENT INTERVENTION N/A 04/26/2018   Procedure: CORONARY STENT INTERVENTION;  Surgeon: Nigel Mormon, MD;  Location: [facility];  Service: Cardiovascular;  Laterality: N/A;   CORONARY STENT INTERVENTION N/A 02/08/2019   Procedure: CORONARY STENT INTERVENTION;  Surgeon: Adrian Prows, MD;  Location: [facility];  Service: Cardiovascular;  Laterality: N/A;  ESOPHAGOGASTRODUODENOSCOPY ENDOSCOPY     FORAMINAL DECOMPRESSION AT L2 TO THE SACRUM  01-05-2008   L2  -  S1   GANGLION CYST EXCISION Left 01/17/2009   ring finger   IMPLANTATION PERMANENT SPINAL CORD STIMULATOR  06-15-2008   JUNE 2013--  BATTERY CHANGE   JOINT REPLACEMENT     LEFT HEART CATH AND CORONARY ANGIOGRAPHY N/A 04/26/2018   Procedure: LEFT HEART CATH AND CORONARY ANGIOGRAPHY;  Surgeon: Nigel Mormon, MD;  Location: [facility];  Service: Cardiovascular;  Laterality: N/A;   LEFT HEART CATH AND CORONARY ANGIOGRAPHY N/A 02/08/2019   Procedure: LEFT HEART CATH AND CORONARY ANGIOGRAPHY;  Surgeon: Adrian Prows, MD;  Location: [facility];  Service: Cardiovascular;  Laterality: N/A;   LIPOMA EXCISION Right    RIGHT ELBOW   METATARSAL HEAD EXCISION Right 06/16/2018   Procedure: right 5th metatarsal excision, a mini c-arm;  Surgeon: Trula Slade, DPM;  Location: [facility];  Service: Podiatry;  Laterality: Right;  anesthesia can do block   RIGHT/LEFT HEART CATH AND CORONARY ANGIOGRAPHY N/A 12/29/2017   Procedure: RIGHT/LEFT HEART CATH AND CORONARY ANGIOGRAPHY;  Surgeon: Nigel Mormon, MD;  Location: [facility];  Service: Cardiovascular;  Laterality: N/A;   SHOULDER OPEN ROTATOR CUFF REPAIR  09/07/2012   Procedure: ROTATOR CUFF REPAIR SHOULDER OPEN;  Surgeon: Magnus Sinning, MD;   Location: [facility];  Service: Orthopedics;  Laterality: Left;  OPEN ANTERIOR ACROMIONECTOMY AND ROTATOR CUFF REPAIR ON LEFT    SHOULDER OPEN ROTATOR CUFF REPAIR Left 12/28/2012   Procedure: ROTATOR CUFF REPAIR SHOULDER OPEN;  Surgeon: Magnus Sinning, MD;  Location: [facility];  Service: Orthopedics;  Laterality: Left;  OPEN SHOULDER ROTATOR CUFF REPAIR ON LEFT  WITH ANTERIOR ACROMINECTOMY    SHOULDER OPEN ROTATOR CUFF REPAIR Right 03/28/2003   RIGHT SHOULDER  DEGENERATIVE AC JOINT AND RC TEAR   SPINAL CORD STIMULATOR BATTERY EXCHANGE N/A 07/03/2016   Procedure: SPINAL CORD STIMULATOR BATTERY PLACMENT;  Surgeon: Melina Schools, MD;  Location: [facility];  Service: Orthopedics;  Laterality: N/A;   TONSILLECTOMY  AGE 55   TOTAL KNEE ARTHROPLASTY Right 05/06/2000   OA RIGHT KNEE   VAGINAL HYSTERECTOMY  1970's    FAMILY HISTORY Family History  Problem Relation Age of Onset   Cancer Mother        breast   Heart Problems Mother    Prostate cancer Father    Muscular dystrophy Son    Lung cancer Son    Cancer Maternal Grandmother        breast   Heart disease Maternal Grandfather    Breast cancer Sister     SOCIAL HISTORY Social History   Tobacco Use   Smoking status: Never   Smokeless tobacco: Never  Vaping Use   Vaping Use: Never used  Substance Use Topics   Alcohol use: No   Drug use: No         OPHTHALMIC EXAM:  Base Eye Exam     Visual Acuity (ETDRS)       Right Left   Dist [place] 20/30 -2 20/40 +2   Dist ph [place]  NI         Tonometry (Tonopen, 10:29 AM)       Right Left   Pressure 9 8         Pupils       Pupils Dark Light APD   Right PERRL 4 3 None  Left PERRL 4 3 None         Extraocular Movement       Right Left    Full Full         Neuro/Psych     Oriented x3: Yes   Mood/Affect: Normal         Dilation     Right eye: 1.0% Mydriacyl, 2.5% Phenylephrine @ 10:29 AM           Slit Lamp and Fundus  Exam     External Exam       Right Left   External Normal Normal         Slit Lamp Exam       Right Left   Lids/Lashes Normal Normal   Conjunctiva/Sclera White and quiet White and quiet   Cornea Clear Clear   Anterior Chamber Deep and quiet Deep and quiet   Iris Round and reactive Round and reactive   Lens Posterior chamber intraocular lens Posterior chamber intraocular lens   Anterior Vitreous Normal Normal         Fundus Exam       Right Left   Posterior Vitreous Normal    Disc Normal    C/D Ratio 0.3    Macula Microaneurysms, Macular thickening, Moderate clinically significant macular edema, center involved    Vessels NPDR-Severe    Periphery Normal             IMAGING AND PROCEDURES  Imaging and Procedures for 10/02/21  Intravitreal Injection, Pharmacologic Agent - OD - Right Eye       Time Out 10/02/2021. 11:49 AM. Confirmed correct patient, procedure, site, and patient consented.   Anesthesia Topical anesthesia was used. Anesthetic medications included Lidocaine 4%.   Procedure Preparation included Tobramycin 0.3%, 10% betadine to eyelids, 5% betadine to ocular surface. A 30 gauge needle was used.   Injection: 2.5 mg bevacizumab 2.5 MG/0.1ML   Route: Intravitreal, Site: Right Eye   NDC: [id number], Lot: [id number]   Post-op Post injection exam found visual acuity of at least counting fingers. The patient tolerated the procedure well. There were no complications. The patient received written and verbal post procedure care education. Post injection medications included ocuflox.      OCT, Retina - OU - Both Eyes       Right Eye Quality was good. Scan locations included subfoveal. Central Foveal Thickness: 484. Progression has improved. Findings include abnormal foveal contour.   Left Eye Quality was good. Scan locations included extrafoveal. Central Foveal Thickness: 484. Progression has improved. Findings include abnormal foveal contour.    Notes CSME OU, with center involvement OD, now 5 weeks post recent Avastin OD improved.  Improved, we will repeat injection Avastin OD today OS increased CSME still active OS at 4 week postinjection, follow-up as scheduled OS               ASSESSMENT/PLAN:  Severe nonproliferative diabetic retinopathy of right eye, with macular edema, associated with type 2 diabetes mellitus (HCC) CSME associated with DME, improving currently at 5-week interval post recent antivegF, will have to switch back to Avastin today as patient unable to pay the high dollar co-pay as the [employer] has run out of money for co-pay assistance     ICD-10-CM   1. Severe nonproliferative diabetic retinopathy of right eye, with macular edema, associated with type 2 diabetes mellitus (HCC)  Y81.4481 Intravitreal Injection, Pharmacologic Agent - OD - Right Eye    OCT, Retina -  OU - Both Eyes    bevacizumab (AVASTIN) SOSY 2.5 mg      1.  CSME OD improving, yet at 5-week interval still persistent CSME repeat injection into vegF, will use Avastin today  2.  3.  Ophthalmic Meds Ordered this visit:  Meds ordered this encounter  Medications   bevacizumab (AVASTIN) SOSY 2.5 mg       Return in about 5 weeks (around 11/06/2021) for dilate, OD, AVASTIN OCT,, and follow-up OS as scheduled.  There are no Patient Instructions on file for this visit.   Explained the diagnoses, plan, and follow up with the patient and they expressed understanding.  Patient expressed understanding of the importance of proper follow up care.   Clent Demark Nicoletta Hush M.D. Diseases & Surgery of the Retina and Vitreous [facility]ina [facility] Diabetic [place] 10/02/21     Abbreviations: M myopia (nearsighted); A astigmatism; H hyperopia (farsighted); P presbyopia; Mrx spectacle prescription;  CTL contact lenses; OD right eye; OS left eye; OU both eyes  XT exotropia; ET esotropia; PEK punctate epithelial keratitis; PEE punctate epithelial  erosions; DES dry eye syndrome; MGD meibomian gland dysfunction; ATs artificial tears; PFAT's preservative free artificial tears; [place] nuclear sclerotic cataract; PSC posterior subcapsular cataract; ERM epi-retinal membrane; PVD posterior vitreous detachment; RD retinal detachment; DM diabetes mellitus; DR diabetic retinopathy; NPDR non-proliferative diabetic retinopathy; PDR proliferative diabetic retinopathy; CSME clinically significant macular edema; DME diabetic macular edema; dbh dot blot hemorrhages; CWS cotton wool spot; POAG primary open angle glaucoma; C/D cup-to-disc ratio; HVF humphrey visual field; GVF goldmann visual field; OCT optical coherence tomography; IOP intraocular pressure; BRVO Branch retinal vein occlusion; CRVO central retinal vein occlusion; CRAO central retinal artery occlusion; BRAO branch retinal artery occlusion; RT retinal tear; SB scleral buckle; PPV pars plana vitrectomy; VH Vitreous hemorrhage; PRP panretinal laser photocoagulation; IVK intravitreal kenalog; VMT vitreomacular traction; MH Macular hole;  NVD neovascularization of the disc; NVE neovascularization elsewhere; AREDS age related eye disease study; ARMD age related macular degeneration; POAG primary open angle glaucoma; EBMD epithelial/anterior basement membrane dystrophy; ACIOL anterior chamber intraocular lens; IOL intraocular lens; PCIOL posterior chamber intraocular lens; Phaco/IOL phacoemulsification with intraocular lens placement; [place] photorefractive keratectomy; LASIK laser assisted in situ keratomileusis; HTN hypertension; DM diabetes mellitus; COPD chronic obstructive pulmonary disease

## 2021-10-02 NOTE — Assessment & Plan Note (Signed)
OS also improved center involved CSME post antivegF, follow-up OS as scheduled

## 2021-10-02 NOTE — Assessment & Plan Note (Signed)
CSME associated with DME, improving currently at 5-week interval post recent antivegF, will have to switch back to Avastin today as patient unable to pay the high dollar co-pay as the [employer] has run out of money for co-pay assistance

## 2021-10-05 ENCOUNTER — Other Ambulatory Visit: Payer: Self-pay | Admitting: Cardiology

## 2021-10-14 ENCOUNTER — Other Ambulatory Visit: Payer: Self-pay

## 2021-10-14 ENCOUNTER — Encounter (INDEPENDENT_AMBULATORY_CARE_PROVIDER_SITE_OTHER): Payer: Medicare PPO | Admitting: Ophthalmology

## 2021-10-14 ENCOUNTER — Encounter (INDEPENDENT_AMBULATORY_CARE_PROVIDER_SITE_OTHER): Payer: Self-pay | Admitting: Ophthalmology

## 2021-10-14 ENCOUNTER — Ambulatory Visit (INDEPENDENT_AMBULATORY_CARE_PROVIDER_SITE_OTHER): Payer: Medicare PPO | Admitting: Ophthalmology

## 2021-10-14 DIAGNOSIS — E113412 Type 2 diabetes mellitus with severe nonproliferative diabetic retinopathy with macular edema, left eye: Secondary | ICD-10-CM | POA: Diagnosis not present

## 2021-10-14 DIAGNOSIS — E113411 Type 2 diabetes mellitus with severe nonproliferative diabetic retinopathy with macular edema, right eye: Secondary | ICD-10-CM

## 2021-10-14 DIAGNOSIS — E113511 Type 2 diabetes mellitus with proliferative diabetic retinopathy with macular edema, right eye: Secondary | ICD-10-CM | POA: Diagnosis not present

## 2021-10-14 NOTE — Assessment & Plan Note (Signed)
OS currently at 4 weeks 6 days postinjection Avastin overall improved center involved CSME follow-up as scheduled

## 2021-10-14 NOTE — Progress Notes (Signed)
10/14/2021     CHIEF COMPLAINT Patient presents for No chief complaint on file.     HISTORY OF PRESENT ILLNESS: Sara Graves is a 80 y.o. female who presents to the clinic today for:   HPI   9 week prp od Pt states VA OU stable since last visit. Pt denies FOL, floaters, or ocular pain OU.  Pt last laser tx OD was 07/2021 last intravitreal injection OD was 10/02/2021. A1C:6.0 LBS: 115   Last edited by Kendra Opitz, COA on 10/14/2021  9:17 AM.      Referring physician: [name], PA-C [place],  [place] [address]  HISTORICAL INFORMATION:   Selected notes from the MEDICAL RECORD NUMBER    Lab Results  Component Value Date   HGBA1C 9.8 (H) 02/12/2021     CURRENT MEDICATIONS: No current outpatient medications on file. (Ophthalmic Drugs)   No current facility-administered medications for this visit. (Ophthalmic Drugs)   Current Outpatient Medications (Other)  Medication Sig   alendronate (FOSAMAX) 70 MG tablet Take 1 tablet (70 mg total) by mouth once a week.   ALPRAZolam (XANAX) 0.25 MG tablet Take 1 tablet (0.25 mg total) by mouth as directed. 1/2 tablet r twice daily or one tab at night for anxiety   aspirin EC 81 MG tablet Take 81 mg by mouth daily.   busPIRone (BUSPAR) 5 MG tablet Take 5 mg by mouth 2 (two) times daily. As needed   Calcium Carb-Cholecalciferol (CALCIUM 600 + D PO) Take 1 tablet by mouth daily.    dapagliflozin propanediol (FARXIGA) 10 MG TABS tablet Take 1 tablet (10 mg total) by mouth daily.   diclofenac Sodium (VOLTAREN) 1 % GEL APPLY 2 G TOPICALLY FOUR TIMES DAILY.   ENTRESTO 97-103 MG TAKE 1 TABLET BY MOUTH TWICE A DAY   Evolocumab (REPATHA SURECLICK) 841 MG/ML SOAJ Inject 140 mg into the skin every 14 (fourteen) days for 6 doses.   ezetimibe (ZETIA) 10 MG tablet Take 10 mg by mouth daily.   FLUoxetine (PROZAC) 10 MG capsule Take 10 mg by mouth daily.   gabapentin (NEURONTIN) 100 MG capsule Take 100 mg by mouth at  bedtime.   glucose blood test strip 1 each 3 (three) times daily.    insulin aspart (NOVOLOG FLEXPEN) 100 UNIT/ML FlexPen Inject into the skin.   insulin degludec (TRESIBA) 100 UNIT/ML FlexTouch Pen Inject into the skin.   isosorbide dinitrate (ISORDIL) 30 MG tablet TAKE 1 TABLET BY MOUTH THREE TIMES A DAY   levothyroxine (SYNTHROID, LEVOTHROID) 50 MCG tablet Take 50 mcg by mouth daily before breakfast.    Magnesium Oxide 500 MG TABS TAKE 1 TABLET (500 MG TOTAL) BY MOUTH IN THE MORNING AND AT BEDTIME. (Patient taking differently: Take 500 mg by mouth in the morning, at noon, and at bedtime.)   methocarbamol (ROBAXIN) 500 MG tablet Take 500 mg by mouth 4 (four) times daily.   metoprolol succinate (TOPROL-XL) 50 MG 24 hr tablet TAKE 1 TABLET BY MOUTH EVERY DAY WITH OR IMMEDIATELY FOLLOWING A MEAL   nitroGLYCERIN (NITROSTAT) 0.4 MG SL tablet PLACE 1 TABLET UNDER THE TONGUE EVERY 5 MINUTES X 3 DOSES AS NEEDED FOR CHEST PAIN. (Patient taking differently: Place 0.4 mg under the tongue every 5 (five) minutes as needed for chest pain.)   ONETOUCH VERIO test strip 1 each by Other route 3 (three) times daily.   rosuvastatin (CRESTOR) 40 MG tablet Take 1 tablet (40 mg total) by mouth  at bedtime.   torsemide (DEMADEX) 20 MG tablet Take 20 mg by mouth every other day.   vitamin B-12 (CYANOCOBALAMIN) 1000 MCG tablet Take 1,000 mcg by mouth daily.    Vitamin D, Ergocalciferol, (DRISDOL) 50000 units CAPS capsule Take 50,000 Units by mouth every 7 (seven) days. Take on Thursdays   No current facility-administered medications for this visit. (Other)      REVIEW OF SYSTEMS:    ALLERGIES Allergies  Allergen Reactions   Rosiglitazone Maleate Anaphylaxis and Swelling   Morphine Other (See Comments)    SEVERE HYPOTENSION    Cephalexin Diarrhea   Sulfa Antibiotics Diarrhea and Nausea And Vomiting   Tramadol Other (See Comments)    Unknown reaction   Elavil [Amitriptyline] Nausea Only    Tramadol-Acetaminophen Rash    PAST MEDICAL HISTORY Past Medical History:  Diagnosis Date   Acute combined systolic and diastolic heart failure ([place]) 04/25/2018   Anxiety    Arthritis    "hands" (12/29/2017)   Basal cell carcinoma of nose    removed   Bursitis of left shoulder    BV ICD [employer] [place] MRI CRTD 06/21/2019   Cat scratch fever    Late 90s   Chronic combined systolic and diastolic CHF (congestive heart failure) ([place]) 06/08/2019   Coronary artery disease    Depression    Diabetes mellitus, type II, insulin dependent ([place])    Encounter for assessment of implantable cardioverter-defibrillator (ICD) 09/28/2019   GERD (gastroesophageal reflux disease)    H. pylori infection 2008 and 1998   treated   Hyperlipidemia    Hypertension    Hypothyroidism    Low oxygen saturation    Macular degeneration    Multinodular goiter    Rotator cuff tear, left recurrent    Urge urinary incontinence    UTI (lower urinary tract infection) 05/2016   Past Surgical History:  Procedure Laterality Date   APPENDECTOMY  AGE 62   BACK SURGERY     BASAL CELL CARCINOMA EXCISION     "nose"   BIV ICD INSERTION CRT-D N/A 06/21/2019   Procedure: BIV ICD INSERTION CRT-D;  Surgeon: Evans Lance, MD;  Location: [facility];  Service: Cardiovascular;  Laterality: N/A;   BLADDER NECK SUSPENSION  1970's   BUNIONECTOMY WITH HAMMERTOE RECONSTRUCTION Bilateral    CARPAL TUNNEL RELEASE Right 05/2017   COLONOSCOPY W/ POLYPECTOMY     CORONARY ANGIOPLASTY WITH STENT PLACEMENT  12/29/2017   CORONARY BALLOON ANGIOPLASTY N/A 02/08/2019   Procedure: CORONARY BALLOON ANGIOPLASTY;  Surgeon: Adrian Prows, MD;  Location: [facility];  Service: Cardiovascular;  Laterality: N/A;   CORONARY STENT INTERVENTION N/A 12/29/2017   Procedure: CORONARY STENT INTERVENTION;  Surgeon: Nigel Mormon, MD;  Location: [facility];  Service: Cardiovascular;  Laterality: N/A;   CORONARY STENT  INTERVENTION N/A 04/26/2018   Procedure: CORONARY STENT INTERVENTION;  Surgeon: Nigel Mormon, MD;  Location: [facility];  Service: Cardiovascular;  Laterality: N/A;   CORONARY STENT INTERVENTION N/A 02/08/2019   Procedure: CORONARY STENT INTERVENTION;  Surgeon: Adrian Prows, MD;  Location: [facility];  Service: Cardiovascular;  Laterality: N/A;   ESOPHAGOGASTRODUODENOSCOPY ENDOSCOPY     FORAMINAL DECOMPRESSION AT L2 TO THE SACRUM  01-05-2008   L2  -  S1   GANGLION CYST EXCISION Left 01/17/2009   ring finger   IMPLANTATION PERMANENT SPINAL CORD STIMULATOR  06-15-2008   JUNE 2013--  BATTERY CHANGE   JOINT REPLACEMENT  LEFT HEART CATH AND CORONARY ANGIOGRAPHY N/A 04/26/2018   Procedure: LEFT HEART CATH AND CORONARY ANGIOGRAPHY;  Surgeon: Nigel Mormon, MD;  Location: [facility];  Service: Cardiovascular;  Laterality: N/A;   LEFT HEART CATH AND CORONARY ANGIOGRAPHY N/A 02/08/2019   Procedure: LEFT HEART CATH AND CORONARY ANGIOGRAPHY;  Surgeon: Adrian Prows, MD;  Location: [facility];  Service: Cardiovascular;  Laterality: N/A;   LIPOMA EXCISION Right    RIGHT ELBOW   METATARSAL HEAD EXCISION Right 06/16/2018   Procedure: right 5th metatarsal excision, a mini c-arm;  Surgeon: Trula Slade, DPM;  Location: [facility];  Service: Podiatry;  Laterality: Right;  anesthesia can do block   RIGHT/LEFT HEART CATH AND CORONARY ANGIOGRAPHY N/A 12/29/2017   Procedure: RIGHT/LEFT HEART CATH AND CORONARY ANGIOGRAPHY;  Surgeon: Nigel Mormon, MD;  Location: [facility];  Service: Cardiovascular;  Laterality: N/A;   SHOULDER OPEN ROTATOR CUFF REPAIR  09/07/2012   Procedure: ROTATOR CUFF REPAIR SHOULDER OPEN;  Surgeon: Magnus Sinning, MD;  Location: [facility];  Service: Orthopedics;  Laterality: Left;  OPEN ANTERIOR ACROMIONECTOMY AND ROTATOR CUFF REPAIR ON LEFT    SHOULDER OPEN ROTATOR CUFF REPAIR Left 12/28/2012   Procedure: ROTATOR CUFF  REPAIR SHOULDER OPEN;  Surgeon: Magnus Sinning, MD;  Location: [facility];  Service: Orthopedics;  Laterality: Left;  OPEN SHOULDER ROTATOR CUFF REPAIR ON LEFT  WITH ANTERIOR ACROMINECTOMY    SHOULDER OPEN ROTATOR CUFF REPAIR Right 03/28/2003   RIGHT SHOULDER  DEGENERATIVE AC JOINT AND RC TEAR   SPINAL CORD STIMULATOR BATTERY EXCHANGE N/A 07/03/2016   Procedure: SPINAL CORD STIMULATOR BATTERY PLACMENT;  Surgeon: Melina Schools, MD;  Location: [facility];  Service: Orthopedics;  Laterality: N/A;   TONSILLECTOMY  AGE 28   TOTAL KNEE ARTHROPLASTY Right 05/06/2000   OA RIGHT KNEE   VAGINAL HYSTERECTOMY  1970's    FAMILY HISTORY Family History  Problem Relation Age of Onset   Cancer Mother        breast   Heart Problems Mother    Prostate cancer Father    Muscular dystrophy Son    Lung cancer Son    Cancer Maternal Grandmother        breast   Heart disease Maternal Grandfather    Breast cancer Sister     SOCIAL HISTORY Social History   Tobacco Use   Smoking status: Never   Smokeless tobacco: Never  Vaping Use   Vaping Use: Never used  Substance Use Topics   Alcohol use: No   Drug use: No         OPHTHALMIC EXAM:  Base Eye Exam     Visual Acuity (ETDRS)       Right Left   Dist [place] 20/30 +1 20/40   Dist ph [place] NI NI         Tonometry (Tonopen, 9:22 AM)       Right Left   Pressure 07 09         Pupils       Pupils Dark Light Shape React APD   Right PERRL 3 2 Round Brisk None   Left PERRL 3 2 Round Brisk None         Visual Fields (Counting fingers)       Left Right    Full Full         Extraocular Movement       Right Left    Full  Full         Neuro/Psych     Oriented x3: Yes   Mood/Affect: Normal         Dilation     Right eye: 1.0% Mydriacyl, 2.5% Phenylephrine @ 9:22 AM           Slit Lamp and Fundus Exam     External Exam       Right Left   External Normal Normal         Slit Lamp Exam        Right Left   Lids/Lashes Normal Normal   Conjunctiva/Sclera White and quiet White and quiet   Cornea Clear Clear   Anterior Chamber Deep and quiet Deep and quiet   Iris Round and reactive Round and reactive   Lens Posterior chamber intraocular lens Posterior chamber intraocular lens   Anterior Vitreous Normal Normal         Fundus Exam       Right Left   Posterior Vitreous Normal    Disc Normal    C/D Ratio 0.3    Macula Microaneurysms, Macular thickening, Moderate clinically significant macular edema, center involved    Vessels NPDR-Severe, with NVE INF NASAL TO NERVE    Periphery Normal             IMAGING AND PROCEDURES  Imaging and Procedures for 10/14/21  OCT, Retina - OU - Both Eyes       Right Eye Quality was good. Scan locations included subfoveal. Central Foveal Thickness: 485. Progression has improved. Findings include abnormal foveal contour.   Left Eye Quality was good. Scan locations included extrafoveal. Central Foveal Thickness: 484. Progression has improved. Findings include abnormal foveal contour.   Notes CSME OU, with center involvement OD, now  1 week post recent Avastin OD improved.  Need peripheral PRP to decrease vegF burden OD today  OS with improved center involved CSME overall as compared to last visit 09/11/2021.  Follow-up as scheduled      Panretinal Photocoagulation - OD - Right Eye       Time Out Confirmed correct patient, procedure, site, and patient consented.   Anesthesia Topical anesthesia was used. Anesthetic medications included Proparacaine 0.5%.   Laser Information The type of laser was diode. Color was yellow. The duration in seconds was 0.03. The spot size was 390 microns. Laser power was 280. Total spots was 576.   Post-op The patient tolerated the procedure well. There were no complications. The patient received written and verbal post procedure care education.   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 Severe nonproliferative diabetic retinopathy of right eye, with macular edema, associated with type 2 diabetes mellitus (HCC)  E11.3411 OCT, Retina - OU - Both Eyes    Panretinal Photocoagulation - OD - Right Eye    2. Severe nonproliferative diabetic retinopathy  of left eye, with macular edema, associated with type 2 diabetes mellitus ([place])  O97.3532     3. 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  OU vastly improved status post most recent injections of antivegF for center involved CSME.  2.  PRP #1 OD completed in the past, September 2022, for PRP #2 OD to decrease vegF burden  3.  Dilate OU next in the coming weeks as scheduled for injection Avastin  Ophthalmic Meds Ordered this visit:  No orders of the defined types were placed in this encounter.      Return for As scheduled, Avastin OCT OS, and Avastin OCT OD on different days.  There are no Patient Instructions on file for this visit.   Explained the diagnoses, plan, and follow up with the patient and they expressed understanding.  Patient expressed understanding of the importance of proper follow up care.   Sara Graves M.D. Diseases & Surgery of the Retina and Vitreous [facility] [place] 10/14/21     Abbreviations: M myopia (nearsighted); A astigmatism; H hyperopia (farsighted); P presbyopia; Mrx spectacle prescription;  CTL contact lenses; OD right eye; OS left eye; OU both eyes  XT exotropia; ET esotropia; PEK punctate epithelial keratitis; PEE punctate epithelial erosions; DES dry eye syndrome; MGD meibomian gland dysfunction; ATs artificial tears; PFAT's preservative free artificial tears; [place] nuclear sclerotic cataract; PSC posterior subcapsular cataract; ERM epi-retinal membrane; PVD posterior vitreous detachment; RD retinal detachment; DM diabetes mellitus; DR diabetic retinopathy; NPDR non-proliferative diabetic retinopathy; PDR proliferative diabetic retinopathy; CSME clinically significant macular edema; DME diabetic macular edema; dbh dot blot hemorrhages; CWS cotton wool spot; POAG primary open angle glaucoma; C/D cup-to-disc ratio; HVF humphrey visual field; GVF goldmann visual field; OCT optical coherence tomography; IOP intraocular pressure; BRVO Branch retinal vein occlusion; CRVO central retinal vein occlusion; CRAO central retinal artery occlusion; BRAO branch retinal artery occlusion; RT retinal tear; SB scleral buckle; PPV pars plana vitrectomy; VH Vitreous hemorrhage; PRP panretinal laser photocoagulation; IVK intravitreal kenalog; VMT vitreomacular traction; MH Macular hole;  NVD neovascularization of the disc; NVE neovascularization elsewhere; AREDS age related eye disease study; ARMD age related macular degeneration; POAG primary open angle glaucoma; EBMD epithelial/anterior basement membrane dystrophy; ACIOL anterior chamber intraocular lens; IOL intraocular lens; PCIOL posterior chamber intraocular lens; Phaco/IOL phacoemulsification with intraocular lens placement; [place] photorefractive keratectomy; LASIK laser assisted in situ keratomileusis; HTN hypertension; DM diabetes mellitus; COPD chronic obstructive pulmonary disease

## 2021-10-14 NOTE — Assessment & Plan Note (Addendum)
OD some 1 week post most recent injection of Avastin for center involved CSME, vastly improved yet also with early neovascularization thus changed to PDR with CSME  Will complete PRP #1 OD concentrating nasally

## 2021-10-23 ENCOUNTER — Encounter (INDEPENDENT_AMBULATORY_CARE_PROVIDER_SITE_OTHER): Payer: Self-pay | Admitting: Ophthalmology

## 2021-10-23 ENCOUNTER — Ambulatory Visit (INDEPENDENT_AMBULATORY_CARE_PROVIDER_SITE_OTHER): Payer: Medicare PPO | Admitting: Ophthalmology

## 2021-10-23 ENCOUNTER — Other Ambulatory Visit: Payer: Self-pay

## 2021-10-23 DIAGNOSIS — E113511 Type 2 diabetes mellitus with proliferative diabetic retinopathy with macular edema, right eye: Secondary | ICD-10-CM

## 2021-10-23 DIAGNOSIS — E113412 Type 2 diabetes mellitus with severe nonproliferative diabetic retinopathy with macular edema, left eye: Secondary | ICD-10-CM

## 2021-10-23 MED ORDER — BEVACIZUMAB 2.5 MG/0.1ML IZ SOSY
2.5000 mg | PREFILLED_SYRINGE | INTRAVITREAL | Status: AC | PRN
  Administered 2021-10-23: 2.5 mg via INTRAVITREAL

## 2021-10-23 NOTE — Assessment & Plan Note (Signed)
OS center involved CSME improving.

## 2021-10-23 NOTE — Progress Notes (Signed)
10/23/2021     CHIEF COMPLAINT Patient presents for  Chief Complaint  Patient presents with   Retina Follow Up      HISTORY OF PRESENT ILLNESS: Sara Graves is a 80 y.o. female who presents to the clinic today for:   HPI     Retina Follow Up           Laterality: left eye   Onset: 6   Severity: mild   Duration: 6         Comments   6 week fu OS oct avastin OS.  In 1 week post most recent Avastin OD Patient states vision is stable and unchanged since last visit. Denies any new floaters or FOL. Pt states she has been getting headaches above right eye around the eyebrow. Pt states headaches started Monday 10/21/21. Pt took tylenol Monday night but woke up with headache and pain still. Pt had injection OD 3 weeks ago and PRP OD 1 week ago.      Last edited by Hurman Horn, MD on 10/23/2021 11:38 AM.      Referring physician: [name], PA-C [place],  [place] [address]  HISTORICAL INFORMATION:   Selected notes from the MEDICAL RECORD NUMBER    Lab Results  Component Value Date   HGBA1C 9.8 (H) 02/12/2021     CURRENT MEDICATIONS: No current outpatient medications on file. (Ophthalmic Drugs)   No current facility-administered medications for this visit. (Ophthalmic Drugs)   Current Outpatient Medications (Other)  Medication Sig   alendronate (FOSAMAX) 70 MG tablet Take 1 tablet (70 mg total) by mouth once a week.   ALPRAZolam (XANAX) 0.25 MG tablet Take 1 tablet (0.25 mg total) by mouth as directed. 1/2 tablet r twice daily or one tab at night for anxiety   aspirin EC 81 MG tablet Take 81 mg by mouth daily.   busPIRone (BUSPAR) 5 MG tablet Take 5 mg by mouth 2 (two) times daily. As needed   Calcium Carb-Cholecalciferol (CALCIUM 600 + D PO) Take 1 tablet by mouth daily.    dapagliflozin propanediol (FARXIGA) 10 MG TABS tablet Take 1 tablet (10 mg total) by mouth daily.   diclofenac Sodium (VOLTAREN) 1 % GEL APPLY 2 G TOPICALLY FOUR  TIMES DAILY.   ENTRESTO 97-103 MG TAKE 1 TABLET BY MOUTH TWICE A DAY   Evolocumab (REPATHA SURECLICK) 604 MG/ML SOAJ Inject 140 mg into the skin every 14 (fourteen) days for 6 doses.   ezetimibe (ZETIA) 10 MG tablet Take 10 mg by mouth daily.   FLUoxetine (PROZAC) 10 MG capsule Take 10 mg by mouth daily.   gabapentin (NEURONTIN) 100 MG capsule Take 100 mg by mouth at bedtime.   glucose blood test strip 1 each 3 (three) times daily.    insulin aspart (NOVOLOG FLEXPEN) 100 UNIT/ML FlexPen Inject into the skin.   insulin degludec (TRESIBA) 100 UNIT/ML FlexTouch Pen Inject into the skin.   isosorbide dinitrate (ISORDIL) 30 MG tablet TAKE 1 TABLET BY MOUTH THREE TIMES A DAY   levothyroxine (SYNTHROID, LEVOTHROID) 50 MCG tablet Take 50 mcg by mouth daily before breakfast.    Magnesium Oxide 500 MG TABS TAKE 1 TABLET (500 MG TOTAL) BY MOUTH IN THE MORNING AND AT BEDTIME. (Patient taking differently: Take 500 mg by mouth in the morning, at noon, and at bedtime.)   methocarbamol (ROBAXIN) 500 MG tablet Take 500 mg by mouth 4 (four) times daily.   metoprolol succinate (TOPROL-XL)  50 MG 24 hr tablet TAKE 1 TABLET BY MOUTH EVERY DAY WITH OR IMMEDIATELY FOLLOWING A MEAL   nitroGLYCERIN (NITROSTAT) 0.4 MG SL tablet PLACE 1 TABLET UNDER THE TONGUE EVERY 5 MINUTES X 3 DOSES AS NEEDED FOR CHEST PAIN. (Patient taking differently: Place 0.4 mg under the tongue every 5 (five) minutes as needed for chest pain.)   ONETOUCH VERIO test strip 1 each by Other route 3 (three) times daily.   rosuvastatin (CRESTOR) 40 MG tablet Take 1 tablet (40 mg total) by mouth at bedtime.   torsemide (DEMADEX) 20 MG tablet Take 20 mg by mouth every other day.   vitamin B-12 (CYANOCOBALAMIN) 1000 MCG tablet Take 1,000 mcg by mouth daily.    Vitamin D, Ergocalciferol, (DRISDOL) 50000 units CAPS capsule Take 50,000 Units by mouth every 7 (seven) days. Take on Thursdays   No current facility-administered medications for this visit. (Other)       REVIEW OF SYSTEMS:    ALLERGIES Allergies  Allergen Reactions   Rosiglitazone Maleate Anaphylaxis and Swelling   Morphine Other (See Comments)    SEVERE HYPOTENSION    Cephalexin Diarrhea   Sulfa Antibiotics Diarrhea and Nausea And Vomiting   Tramadol Other (See Comments)    Unknown reaction   Elavil [Amitriptyline] Nausea Only   Tramadol-Acetaminophen Rash    PAST MEDICAL HISTORY Past Medical History:  Diagnosis Date   Acute combined systolic and diastolic heart failure ([place]) 04/25/2018   Anxiety    Arthritis    "hands" (12/29/2017)   Basal cell carcinoma of nose    removed   Bursitis of left shoulder    BV ICD Med[employer] [place] MRI CRTD 06/21/2019   Cat scratch fever    Late 90s   Chronic combined systolic and diastolic CHF (congestive heart failure) ([place]) 06/08/2019   Coronary artery disease    Depression    Diabetes mellitus, type II, insulin dependent ([place])    Encounter for assessment of implantable cardioverter-defibrillator (ICD) 09/28/2019   GERD (gastroesophageal reflux disease)    H. pylori infection 2008 and 1998   treated   Hyperlipidemia    Hypertension    Hypothyroidism    Low oxygen saturation    Macular degeneration    Multinodular goiter    Rotator cuff tear, left recurrent    Urge urinary incontinence    UTI (lower urinary tract infection) 05/2016   Past Surgical History:  Procedure Laterality Date   APPENDECTOMY  AGE 59   BACK SURGERY     BASAL CELL CARCINOMA EXCISION     "nose"   BIV ICD INSERTION CRT-D N/A 06/21/2019   Procedure: BIV ICD INSERTION CRT-D;  Surgeon: Evans Lance, MD;  Location: [facility];  Service: Cardiovascular;  Laterality: N/A;   BLADDER NECK SUSPENSION  1970's   BUNIONECTOMY WITH HAMMERTOE RECONSTRUCTION Bilateral    CARPAL TUNNEL RELEASE Right 05/2017   COLONOSCOPY W/ POLYPECTOMY     CORONARY ANGIOPLASTY WITH STENT PLACEMENT  12/29/2017   CORONARY BALLOON ANGIOPLASTY N/A 02/08/2019   Procedure:  CORONARY BALLOON ANGIOPLASTY;  Surgeon: Adrian Prows, MD;  Location: [facility];  Service: Cardiovascular;  Laterality: N/A;   CORONARY STENT INTERVENTION N/A 12/29/2017   Procedure: CORONARY STENT INTERVENTION;  Surgeon: Nigel Mormon, MD;  Location: [facility];  Service: Cardiovascular;  Laterality: N/A;   CORONARY STENT INTERVENTION N/A 04/26/2018   Procedure: CORONARY STENT INTERVENTION;  Surgeon: Nigel Mormon, MD;  Location: [facility];  Service:  Cardiovascular;  Laterality: N/A;   CORONARY STENT INTERVENTION N/A 02/08/2019   Procedure: CORONARY STENT INTERVENTION;  Surgeon: Adrian Prows, MD;  Location: [facility];  Service: Cardiovascular;  Laterality: N/A;   ESOPHAGOGASTRODUODENOSCOPY ENDOSCOPY     FORAMINAL DECOMPRESSION AT L2 TO THE SACRUM  01-05-2008   L2  -  S1   GANGLION CYST EXCISION Left 01/17/2009   ring finger   IMPLANTATION PERMANENT SPINAL CORD STIMULATOR  06-15-2008   JUNE 2013--  BATTERY CHANGE   JOINT REPLACEMENT     LEFT HEART CATH AND CORONARY ANGIOGRAPHY N/A 04/26/2018   Procedure: LEFT HEART CATH AND CORONARY ANGIOGRAPHY;  Surgeon: Nigel Mormon, MD;  Location: [facility];  Service: Cardiovascular;  Laterality: N/A;   LEFT HEART CATH AND CORONARY ANGIOGRAPHY N/A 02/08/2019   Procedure: LEFT HEART CATH AND CORONARY ANGIOGRAPHY;  Surgeon: Adrian Prows, MD;  Location: [facility];  Service: Cardiovascular;  Laterality: N/A;   LIPOMA EXCISION Right    RIGHT ELBOW   METATARSAL HEAD EXCISION Right 06/16/2018   Procedure: right 5th metatarsal excision, a mini c-arm;  Surgeon: Trula Slade, DPM;  Location: [facility];  Service: Podiatry;  Laterality: Right;  anesthesia can do block   RIGHT/LEFT HEART CATH AND CORONARY ANGIOGRAPHY N/A 12/29/2017   Procedure: RIGHT/LEFT HEART CATH AND CORONARY ANGIOGRAPHY;  Surgeon: Nigel Mormon, MD;  Location: [facility];  Service: Cardiovascular;  Laterality: N/A;    SHOULDER OPEN ROTATOR CUFF REPAIR  09/07/2012   Procedure: ROTATOR CUFF REPAIR SHOULDER OPEN;  Surgeon: Magnus Sinning, MD;  Location: [facility];  Service: Orthopedics;  Laterality: Left;  OPEN ANTERIOR ACROMIONECTOMY AND ROTATOR CUFF REPAIR ON LEFT    SHOULDER OPEN ROTATOR CUFF REPAIR Left 12/28/2012   Procedure: ROTATOR CUFF REPAIR SHOULDER OPEN;  Surgeon: Magnus Sinning, MD;  Location: [facility];  Service: Orthopedics;  Laterality: Left;  OPEN SHOULDER ROTATOR CUFF REPAIR ON LEFT  WITH ANTERIOR ACROMINECTOMY    SHOULDER OPEN ROTATOR CUFF REPAIR Right 03/28/2003   RIGHT SHOULDER  DEGENERATIVE AC JOINT AND RC TEAR   SPINAL CORD STIMULATOR BATTERY EXCHANGE N/A 07/03/2016   Procedure: SPINAL CORD STIMULATOR BATTERY PLACMENT;  Surgeon: Melina Schools, MD;  Location: [facility];  Service: Orthopedics;  Laterality: N/A;   TONSILLECTOMY  AGE 30   TOTAL KNEE ARTHROPLASTY Right 05/06/2000   OA RIGHT KNEE   VAGINAL HYSTERECTOMY  1970's    FAMILY HISTORY Family History  Problem Relation Age of Onset   Cancer Mother        breast   Heart Problems Mother    Prostate cancer Father    Muscular dystrophy Son    Lung cancer Son    Cancer Maternal Grandmother        breast   Heart disease Maternal Grandfather    Breast cancer Sister     SOCIAL HISTORY Social History   Tobacco Use   Smoking status: Never   Smokeless tobacco: Never  Vaping Use   Vaping Use: Never used  Substance Use Topics   Alcohol use: No   Drug use: No         OPHTHALMIC EXAM:  Base Eye Exam     Visual Acuity (ETDRS)       Right Left   Dist [place] 20/30 -2 20/30 -1         Tonometry (Tonopen, 10:16 AM)       Right Left   Pressure 11  14         Pupils       Pupils Dark Light APD   Right PERRL 3 2 None   Left PERRL 3 2 None         Extraocular Movement       Right Left    Full Full         Neuro/Psych     Oriented x3: Yes   Mood/Affect: Normal          Dilation     Both eyes: 1.0% Mydriacyl, 2.5% Phenylephrine @ 10:16 AM           Slit Lamp and Fundus Exam     External Exam       Right Left   External Normal Normal         Slit Lamp Exam       Right Left   Lids/Lashes Normal Normal   Conjunctiva/Sclera White and quiet White and quiet   Cornea Clear Clear   Anterior Chamber Deep and quiet Deep and quiet   Iris Round and reactive Round and reactive   Lens Posterior chamber intraocular lens Posterior chamber intraocular lens   Anterior Vitreous Normal Normal         Fundus Exam       Right Left   Posterior Vitreous  Normal   Disc  Normal   C/D Ratio  0.3   Macula  Microaneurysms, Macular thickening, Moderate clinically significant macular edema, superotemporal to FAZ   Vessels  NPDR-Severe   Periphery  Normal            IMAGING AND PROCEDURES  Imaging and Procedures for 10/23/21  Intravitreal Injection, Pharmacologic Agent - OS - Left Eye       Time Out 10/23/2021. 11:42 AM. Confirmed correct patient, procedure, site, and patient consented.   Anesthesia Topical anesthesia was used. Anesthetic medications included Lidocaine 4%.   Procedure Preparation included Tobramycin 0.3%, Ofloxacin , 5% betadine to ocular surface, 10% betadine to eyelids. A 30 gauge needle was used.   Injection: 2.5 mg bevacizumab 2.5 MG/0.1ML   Route: Intravitreal, Site: Left Eye   NDC: [phone], Lot: [id number]   Post-op Post injection exam found visual acuity of at least counting fingers. The patient tolerated the procedure well. There were no complications. The patient received written and verbal post procedure care education. Post injection medications included ocuflox.      OCT, Retina - OU - Both Eyes       Right Eye Quality was good. Scan locations included subfoveal. Central Foveal Thickness: 485. Progression has improved. Findings include abnormal foveal contour.   Left Eye Quality was good. Scan  locations included extrafoveal. Central Foveal Thickness: 484. Progression has improved. Findings include abnormal foveal contour.   Notes Center involved CSME OD, vastly improved after recent injection Avastin.  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     ICD-10-CM   1. Severe nonproliferative diabetic retinopathy of left eye, with macular edema, associated with type 2 diabetes mellitus ([place])  Z76.7341  Intravitreal Injection, Pharmacologic Agent - OS - Left Eye    OCT, Retina - OU - Both Eyes    bevacizumab (AVASTIN) SOSY 2.5 mg    2. 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  OS,  intravitreal Avastin today followed up thereafter by examination in 7 weeks for center involved CSME  2.  OD improving post recent injection  3.  Ophthalmic Meds Ordered this visit:  Meds ordered this encounter  Medications   bevacizumab (AVASTIN) SOSY 2.5 mg       Return in about 7 weeks (around 12/11/2021) for dilate, OS, AVASTIN OCT.  There are no Patient Instructions on file for this visit.   Explained the diagnoses, plan, and follow up with the patient and they expressed understanding.  Patient expressed understanding of the importance of proper follow up care.   Clent Demark Moana Munford M.D. Diseases & Surgery of the Retina and Vitreous Retina & Diabetic [place] 10/23/21     Abbreviations: M myopia (nearsighted); A astigmatism; H hyperopia  (farsighted); P presbyopia; Mrx spectacle prescription;  CTL contact lenses; OD right eye; OS left eye; OU both eyes  XT exotropia; ET esotropia; PEK punctate epithelial keratitis; PEE punctate epithelial erosions; DES dry eye syndrome; MGD meibomian gland dysfunction; ATs artificial tears; PFAT's preservative free artificial tears; [place] nuclear sclerotic cataract; PSC posterior subcapsular cataract; ERM epi-retinal membrane; PVD posterior vitreous detachment; RD retinal detachment; DM diabetes mellitus; DR diabetic retinopathy; NPDR non-proliferative diabetic retinopathy; PDR proliferative diabetic retinopathy; CSME clinically significant macular edema; DME diabetic macular edema; dbh dot blot hemorrhages; CWS cotton wool spot; POAG primary open angle glaucoma; C/D cup-to-disc ratio; HVF humphrey visual field; GVF goldmann visual field; OCT optical coherence tomography; IOP intraocular pressure; BRVO Branch retinal vein occlusion; CRVO central retinal vein occlusion; CRAO central retinal artery occlusion; BRAO branch retinal artery occlusion; RT retinal tear; SB scleral buckle; PPV pars plana vitrectomy; VH Vitreous hemorrhage; PRP panretinal laser photocoagulation; IVK intravitreal kenalog; VMT vitreomacular traction; MH Macular hole;  NVD neovascularization of the disc; NVE neovascularization elsewhere; AREDS age related eye disease study; ARMD age related macular degeneration; POAG primary open angle glaucoma; EBMD epithelial/anterior basement membrane dystrophy; ACIOL anterior chamber intraocular lens; IOL intraocular lens; PCIOL posterior chamber intraocular lens; Phaco/IOL phacoemulsification with intraocular lens placement; [place] photorefractive keratectomy; LASIK laser assisted in situ keratomileusis; HTN hypertension; DM diabetes mellitus; COPD chronic obstructive pulmonary disease

## 2021-10-23 NOTE — Assessment & Plan Note (Signed)
Improved OD  CSME improving on OCT 1 week postinjection

## 2021-10-29 ENCOUNTER — Ambulatory Visit: Payer: Medicare PPO | Admitting: Cardiology

## 2021-11-06 ENCOUNTER — Encounter (INDEPENDENT_AMBULATORY_CARE_PROVIDER_SITE_OTHER): Payer: Medicare PPO | Admitting: Ophthalmology

## 2021-11-23 ENCOUNTER — Telehealth: Payer: Self-pay | Admitting: Cardiology

## 2021-11-25 ENCOUNTER — Other Ambulatory Visit: Payer: Self-pay | Admitting: Cardiology

## 2021-11-25 DIAGNOSIS — Z955 Presence of coronary angioplasty implant and graft: Secondary | ICD-10-CM

## 2021-11-25 DIAGNOSIS — E7800 Pure hypercholesterolemia, unspecified: Secondary | ICD-10-CM

## 2021-11-25 DIAGNOSIS — I2510 Atherosclerotic heart disease of native coronary artery without angina pectoris: Secondary | ICD-10-CM

## 2021-11-25 DIAGNOSIS — I255 Ischemic cardiomyopathy: Secondary | ICD-10-CM

## 2021-11-25 NOTE — Telephone Encounter (Signed)
If your schedule allows.  Also have labs done prior.  Recommend BMP, NT proBNP and magnesium level.

## 2021-11-25 NOTE — Telephone Encounter (Signed)
She has upcoming appointment with you on 12/10/21. Would you like me to see her sooner or is that okay?

## 2021-11-26 ENCOUNTER — Other Ambulatory Visit: Payer: Self-pay | Admitting: Student

## 2021-11-26 DIAGNOSIS — I255 Ischemic cardiomyopathy: Secondary | ICD-10-CM

## 2021-11-26 NOTE — Telephone Encounter (Signed)
Will you please let patient know her pacemaker check showed she may be holding fluid. Recommend labs and moving her appointment up. I will place lab orders to be done before she comes.  Please have front move her appointment to early next week.

## 2021-11-28 LAB — BASIC METABOLIC PANEL
BUN/Creatinine Ratio: 20 (ref 12–28)
BUN: 20 mg/dL (ref 8–27)
CO2: 22 mmol/L (ref 20–29)
Calcium: 8.5 mg/dL — ABNORMAL LOW (ref 8.7–10.3)
Chloride: 108 mmol/L — ABNORMAL HIGH (ref 96–106)
Creatinine, Ser: 0.99 mg/dL (ref 0.57–1.00)
Glucose: 97 mg/dL (ref 70–99)
Potassium: 3.5 mmol/L (ref 3.5–5.2)
Sodium: 143 mmol/L (ref 134–144)
eGFR: 58 mL/min/{1.73_m2} — ABNORMAL LOW (ref >59)

## 2021-11-28 LAB — MAGNESIUM: Magnesium: 1.9 mg/dL (ref 1.6–2.3)

## 2021-11-28 LAB — PRO B NATRIURETIC PEPTIDE: NT-Pro BNP: 6748 pg/mL — ABNORMAL HIGH (ref 0–738)

## 2021-12-01 ENCOUNTER — Other Ambulatory Visit: Payer: Self-pay | Admitting: Cardiology

## 2021-12-02 ENCOUNTER — Other Ambulatory Visit: Payer: Self-pay | Admitting: Pharmacist

## 2021-12-02 ENCOUNTER — Ambulatory Visit: Payer: Medicare PPO | Admitting: Student

## 2021-12-02 ENCOUNTER — Encounter: Payer: Self-pay | Admitting: Student

## 2021-12-02 ENCOUNTER — Other Ambulatory Visit: Payer: Self-pay

## 2021-12-02 VITALS — BP 187/84 | HR 68 | Temp 98.0°F | Resp 17 | Ht 62.0 in | Wt 209.0 lb

## 2021-12-02 DIAGNOSIS — I5041 Acute combined systolic (congestive) and diastolic (congestive) heart failure: Secondary | ICD-10-CM

## 2021-12-02 DIAGNOSIS — E7800 Pure hypercholesterolemia, unspecified: Secondary | ICD-10-CM

## 2021-12-02 DIAGNOSIS — I10 Essential (primary) hypertension: Secondary | ICD-10-CM

## 2021-12-02 MED ORDER — ISOSORBIDE DINITRATE 40 MG PO TABS: 40.0000 mg | ORAL_TABLET | Freq: Three times a day (TID) | ORAL | 3 refills | Status: DC

## 2021-12-02 MED ORDER — ENTRESTO 97-103 MG PO TABS: 1.0000 | ORAL_TABLET | Freq: Two times a day (BID) | ORAL | 3 refills | Status: DC

## 2021-12-02 NOTE — Progress Notes (Signed)
Primary Physician:  [name], PA-C   [name], female    DOB: October 31, 1941, 81 y.o.   MRN: [id number]   Date: 12/02/21 Last Office Visit: 05/24/2021   Subjective:    Chief Complaint  Patient presents with   Congestive Heart Failure   Hyperlipidemia   Follow-up    9 week    HPI: Sara Graves  is a 81 y.o. female  with hypertension, insulin-dependent diabetes mellitus type 2,  CAD s/p prior LAD and ramus PCI,  HFrEF, ischemic cardiomyopathy, s/p BiV ICD implantation 06/2019,  h/o Rt leg cellulitis and metatarsal fracture (managed by Podiatry) with chief complaint of " 3 month followup for congestive heart failure."   Last hospitalized for congestive heart failure in March 2022 most likely secondary to dietary indiscretion after losing her son/grieving process.  During the hospitalization she had diuresed a total net negative urine output of 8 L.  And since then her medications have been uptitrated.  Patient was last seen in the office 08/28/2021 at which time spironolactone was discontinued due to hyperkalemia and Entresto was uptitrated to 97/3 mg twice daily.  Repeat BMP remained stable.  Last office visit patient was advised to follow-up in 8 weeks, however she missed an appointment.  Pacemaker transmission on 11/23/2021 showed thoracic impedance dropping, suggestive of volume overload.  Therefore ordered repeat labs and asked patient to come in for earlier appointment for clinical correlation.  Lab testing revealed stable BMP and normal magnesium, however NT proBNP significantly elevated at 6748.  Patient reports she is feeling well overall.  Denies dyspnea, leg edema, orthopnea.  Denies chest pain, palpitations, syncope, near syncope.  She has not required additional doses of torsemide since last visit.  Patient has gained 8 pounds since last office visit and blood pressure remains uncontrolled.  Past Medical History:  Diagnosis Date   Acute combined  systolic and diastolic heart failure ([place]) 04/25/2018   Anxiety    Arthritis    "hands" (12/29/2017)   Basal cell carcinoma of nose    removed   Bursitis of left shoulder    BV ICD [employer] [place] MRI CRTD 06/21/2019   Cat scratch fever    Late 90s   Chronic combined systolic and diastolic CHF (congestive heart failure) ([place]) 06/08/2019   Coronary artery disease    Depression    Diabetes mellitus, type II, insulin dependent ([place])    Encounter for assessment of implantable cardioverter-defibrillator (ICD) 09/28/2019   GERD (gastroesophageal reflux disease)    H. pylori infection 2008 and 1998   treated   Hyperlipidemia    Hypertension    Hypothyroidism    Low oxygen saturation    Macular degeneration    Multinodular goiter    Rotator cuff tear, left recurrent    Urge urinary incontinence    UTI (lower urinary tract infection) 05/2016   Family History  Problem Relation Age of Onset   Cancer Mother        breast   Heart Problems Mother    Prostate cancer Father    Muscular dystrophy Son    Lung cancer Son    Cancer Maternal Grandmother        breast   Heart disease Maternal Grandfather    Breast cancer Sister    Past Surgical History:  Procedure Laterality Date   APPENDECTOMY  AGE 66   BACK SURGERY     BASAL CELL CARCINOMA EXCISION     "nose"  BIV ICD INSERTION CRT-D N/A 06/21/2019   Procedure: BIV ICD INSERTION CRT-D;  Surgeon: Evans Lance, MD;  Location: [facility];  Service: Cardiovascular;  Laterality: N/A;   BLADDER NECK SUSPENSION  1970's   BUNIONECTOMY WITH HAMMERTOE RECONSTRUCTION Bilateral    CARPAL TUNNEL RELEASE Right 05/2017   COLONOSCOPY W/ POLYPECTOMY     CORONARY ANGIOPLASTY WITH STENT PLACEMENT  12/29/2017   CORONARY BALLOON ANGIOPLASTY N/A 02/08/2019   Procedure: CORONARY BALLOON ANGIOPLASTY;  Surgeon: Adrian Prows, MD;  Location: [facility];  Service: Cardiovascular;  Laterality: N/A;   CORONARY STENT INTERVENTION N/A 12/29/2017    Procedure: CORONARY STENT INTERVENTION;  Surgeon: Nigel Mormon, MD;  Location: [facility];  Service: Cardiovascular;  Laterality: N/A;   CORONARY STENT INTERVENTION N/A 04/26/2018   Procedure: CORONARY STENT INTERVENTION;  Surgeon: Nigel Mormon, MD;  Location: [facility];  Service: Cardiovascular;  Laterality: N/A;   CORONARY STENT INTERVENTION N/A 02/08/2019   Procedure: CORONARY STENT INTERVENTION;  Surgeon: Adrian Prows, MD;  Location: [facility];  Service: Cardiovascular;  Laterality: N/A;   ESOPHAGOGASTRODUODENOSCOPY ENDOSCOPY     FORAMINAL DECOMPRESSION AT L2 TO THE SACRUM  01-05-2008   L2  -  S1   GANGLION CYST EXCISION Left 01/17/2009   ring finger   IMPLANTATION PERMANENT SPINAL CORD STIMULATOR  06-15-2008   JUNE 2013--  BATTERY CHANGE   JOINT REPLACEMENT     LEFT HEART CATH AND CORONARY ANGIOGRAPHY N/A 04/26/2018   Procedure: LEFT HEART CATH AND CORONARY ANGIOGRAPHY;  Surgeon: Nigel Mormon, MD;  Location: [facility];  Service: Cardiovascular;  Laterality: N/A;   LEFT HEART CATH AND CORONARY ANGIOGRAPHY N/A 02/08/2019   Procedure: LEFT HEART CATH AND CORONARY ANGIOGRAPHY;  Surgeon: Adrian Prows, MD;  Location: [facility];  Service: Cardiovascular;  Laterality: N/A;   LIPOMA EXCISION Right    RIGHT ELBOW   METATARSAL HEAD EXCISION Right 06/16/2018   Procedure: right 5th metatarsal excision, a mini c-arm;  Surgeon: Trula Slade, DPM;  Location: [facility];  Service: Podiatry;  Laterality: Right;  anesthesia can do block   RIGHT/LEFT HEART CATH AND CORONARY ANGIOGRAPHY N/A 12/29/2017   Procedure: RIGHT/LEFT HEART CATH AND CORONARY ANGIOGRAPHY;  Surgeon: Nigel Mormon, MD;  Location: [facility];  Service: Cardiovascular;  Laterality: N/A;   SHOULDER OPEN ROTATOR CUFF REPAIR  09/07/2012   Procedure: ROTATOR CUFF REPAIR SHOULDER OPEN;  Surgeon: Magnus Sinning, MD;  Location: [facility];  Service:  Orthopedics;  Laterality: Left;  OPEN ANTERIOR ACROMIONECTOMY AND ROTATOR CUFF REPAIR ON LEFT    SHOULDER OPEN ROTATOR CUFF REPAIR Left 12/28/2012   Procedure: ROTATOR CUFF REPAIR SHOULDER OPEN;  Surgeon: Magnus Sinning, MD;  Location: [facility];  Service: Orthopedics;  Laterality: Left;  OPEN SHOULDER ROTATOR CUFF REPAIR ON LEFT  WITH ANTERIOR ACROMINECTOMY    SHOULDER OPEN ROTATOR CUFF REPAIR Right 03/28/2003   RIGHT SHOULDER  DEGENERATIVE AC JOINT AND RC TEAR   SPINAL CORD STIMULATOR BATTERY EXCHANGE N/A 07/03/2016   Procedure: SPINAL CORD STIMULATOR BATTERY PLACMENT;  Surgeon: Melina Schools, MD;  Location: [facility];  Service: Orthopedics;  Laterality: N/A;   TONSILLECTOMY  AGE 16   TOTAL KNEE ARTHROPLASTY Right 05/06/2000   OA RIGHT KNEE   VAGINAL HYSTERECTOMY  1970's   Social History   Tobacco Use   Smoking status: Never   Smokeless tobacco: Never  Vaping Use  Vaping Use: Never used  Substance Use Topics   Alcohol use: No   Drug use: No    Review of Systems  Constitutional: Positive for weight gain. Negative for chills, decreased appetite and malaise/fatigue.  Cardiovascular:  Negative for chest pain, dyspnea on exertion, leg swelling, orthopnea and syncope.  Respiratory:  Negative for cough.   Endocrine: Negative for cold intolerance.  Hematologic/Lymphatic: Does not bruise/bleed easily.  Musculoskeletal:  Negative for falls and joint swelling.  Gastrointestinal:  Negative for abdominal pain, anorexia, change in bowel habit, diarrhea and nausea.  Neurological:  Negative for headaches and light-headedness.  Psychiatric/Behavioral:  Negative for depression and substance abuse.   All other systems reviewed and are negative.    Objective:   Vitals with BMI 12/02/2021 08/27/2021 08/27/2021  Height 5\' 2"  - 5\' 2"   Weight 209 lbs - 201 lbs 13 oz  BMI 38.25 - 05.3  Systolic 976 734 193  Diastolic 84 53 52  Pulse 68 50 55   CONSTITUTIONAL: Appears older than  stated age, hemodynamically stable, no acute distress.  SKIN: Skin is warm and dry. No rash noted. No cyanosis. No pallor. No jaundice HEAD: Normocephalic and atraumatic.  EYES: No scleral icterus MOUTH/THROAT: Moist oral membranes.  NECK: No JVD present. No thyromegaly noted.  Bilateral carotid bruits  LYMPHATIC: No visible cervical adenopathy.  CHEST Normal respiratory effort. No intercostal retractions.  BiV ICD site is clean dry and intact LUNGS: Clear to auscultation bilaterally.  No stridor. No wheezes. No rales.  CARDIOVASCULAR: Regular, positive X9-K2, soft holosystolic murmur heard at the apex, no gallops or rubs ABDOMINAL: Soft, nontender, nondistended, positive bowel sounds in all 4 quadrants, no apparent ascites.  EXTREMITIES: trace bilateral peripheral edema  HEMATOLOGIC: No significant bruising NEUROLOGIC: Oriented to person, place, and time. Nonfocal. Normal muscle tone.  PSYCHIATRIC: Normal mood and affect. Normal behavior. Cooperative  Laboratory examination:   CMP Latest Ref Rng & Units 11/27/2021 08/27/2021 07/05/2021  Glucose 70 - 99 mg/dL 97 153(H) 114(H)  BUN 8 - 27 mg/dL 20 40(H) 55(H)  Creatinine 0.57 - 1.00 mg/dL 0.99 1.37(H) 1.28(H)  Sodium 134 - 144 mmol/L 143 143 143  Potassium 3.5 - 5.2 mmol/L 3.5 4.1 4.8  Chloride 96 - 106 mmol/L 108(H) 105 108(H)  CO2 20 - 29 mmol/L 22 23 20   Calcium 8.7 - 10.3 mg/dL 8.5(L) 10.0 9.2  Total Protein 6.0 - 8.5 g/dL - - -  Total Bilirubin 0.0 - 1.2 mg/dL - - -  Alkaline Phos 39 - 117 IU/L - - -  AST 0 - 40 IU/L - - -  ALT 0 - 32 IU/L - - -   CBC Latest Ref Rng & Units 06/07/2021 02/12/2021 02/11/2021  WBC 4.0 - 10.5 K/uL 5.0 4.1 4.4  Hemoglobin 12.0 - 15.0 g/dL 12.4 11.9(L) 13.1  Hematocrit 36.0 - 46.0 % 36.6 36.5 41.0  Platelets 150 - 400 K/uL 102(L) 92(L) 94(L)   Lipid Panel  Lab Results  Component Value Date   CHOL 203 (H) 06/03/2021   HDL 68 06/03/2021   LDLCALC 119 (H) 06/03/2021   LDLDIRECT 112 (H) 06/03/2021    TRIG 88 06/03/2021   CHOLHDL 2.9 12/26/2019    HEMOGLOBIN A1C Lab Results  Component Value Date   HGBA1C 9.8 (H) 02/12/2021   MPG 234.56 02/12/2021   TSH No results for input(s): TSH in the last 8760 hours. BNP (last 3 results) Recent Labs    02/11/21 2256 02/13/21 0218 02/14/21 0026  BNP 4,088.9* 2,726.8*  1,477.4*    ProBNP (last 3 results) Recent Labs    07/05/21 1419 08/27/21 1436 11/27/21 1033  PROBNP 2,100* 3,833* 6,748*   PRN Meds:. Medications Discontinued During This Encounter  Medication Reason   ALPRAZolam (XANAX) 0.25 MG tablet    REPATHA SURECLICK 580 MG/ML SOAJ    FLUoxetine (PROZAC) 10 MG capsule    isosorbide dinitrate (ISORDIL) 30 MG tablet Reorder    Current Meds  Medication Sig   alendronate (FOSAMAX) 70 MG tablet Take 1 tablet (70 mg total) by mouth once a week.   aspirin EC 81 MG tablet Take 81 mg by mouth daily.   busPIRone (BUSPAR) 5 MG tablet Take 5 mg by mouth 2 (two) times daily. As needed   Calcium Carb-Cholecalciferol (CALCIUM 600 + D PO) Take 1 tablet by mouth daily.    dapagliflozin propanediol (FARXIGA) 10 MG TABS tablet Take 1 tablet (10 mg total) by mouth daily.   diclofenac Sodium (VOLTAREN) 1 % GEL APPLY 2 G TOPICALLY FOUR TIMES DAILY.   ezetimibe (ZETIA) 10 MG tablet Take 10 mg by mouth daily.   gabapentin (NEURONTIN) 100 MG capsule Take 100 mg by mouth at bedtime.   glucose blood test strip 1 each 3 (three) times daily.    insulin aspart (NOVOLOG FLEXPEN) 100 UNIT/ML FlexPen Inject into the skin.   insulin degludec (TRESIBA) 100 UNIT/ML FlexTouch Pen Inject into the skin.   levothyroxine (SYNTHROID, LEVOTHROID) 50 MCG tablet Take 50 mcg by mouth daily before breakfast.    Magnesium Oxide 500 MG TABS TAKE 1 TABLET (500 MG TOTAL) BY MOUTH IN THE MORNING AND AT BEDTIME. (Patient taking differently: Take 500 mg by mouth in the morning, at noon, and at bedtime.)   methocarbamol (ROBAXIN) 500 MG tablet Take 500 mg by mouth 4 (four)  times daily.   metoprolol succinate (TOPROL-XL) 50 MG 24 hr tablet TAKE 1 TABLET BY MOUTH EVERY DAY WITH OR IMMEDIATELY FOLLOWING A MEAL   nitroGLYCERIN (NITROSTAT) 0.4 MG SL tablet PLACE 1 TABLET UNDER THE TONGUE EVERY 5 MINUTES X 3 DOSES AS NEEDED FOR CHEST PAIN. (Patient taking differently: Place 0.4 mg under the tongue every 5 (five) minutes as needed for chest pain.)   ONETOUCH VERIO test strip 1 each by Other route 3 (three) times daily.   rosuvastatin (CRESTOR) 40 MG tablet Take 1 tablet (40 mg total) by mouth at bedtime.   torsemide (DEMADEX) 20 MG tablet Take 20 mg by mouth every other day.   vitamin B-12 (CYANOCOBALAMIN) 1000 MCG tablet Take 1,000 mcg by mouth daily.    Vitamin D, Ergocalciferol, (DRISDOL) 50000 units CAPS capsule Take 50,000 Units by mouth every 7 (seven) days. Take on Thursdays   [DISCONTINUED] ENTRESTO 97-103 MG TAKE 1 TABLET BY MOUTH TWICE A DAY   [DISCONTINUED] isosorbide dinitrate (ISORDIL) 30 MG tablet TAKE 1 TABLET BY MOUTH THREE TIMES A DAY    Radiology:  No results found.  Cardiac Studies:   EKG: 08/27/2021: Atrial and ventricular paced rhythm.   Echocardiogram: 02/12/2021: LVEF 20-25%, severely reduced left ventricular systolic function, global hypokinesis, LV size moderately dilated, mild LVH, right ventricular systolic function moderately reduced, RV size mildly enlarged, PASP 68 mmHg, severe left atrial dilatation, moderately dilated right atrium, moderate to severe MR, moderate to severe TR, mild AR, IVC dilated estimated RAP 15 mmHg.   Stress test: None   Heart catheterization: Coronary angiogram 02/08/2019:  Distal left main 20% diffuse disease.  Ostial LAD stent shows diffuse 20% in-stent restenosis (3.0  x 15 mm resolute Onyx on 12/29/2017).  Mid LAD and mid to distal LAD there are tandem lesions 80% and 95%.  [place]amus intermediate ostial 95 to 99% stenosis.  Previously placed RI stent widely patent ( 2.0 x 12 mm resolute on 04/26/2018).  30 to 40%  tandem lesions in the RCA. Cutting Balloon angioplasty of the ramus intermediate ostial high-grade stenosis, 99% reduced to 0% and Cutting Balloon angioplasty of the mid LAD followed by stenting with 2.5 x 32 mm Synergy DES for high-grade 90% stenosis reduced to 0% and TIMI-3 to TIMI-3 flow maintained in both lesions.   Carotid artery duplex 12/02/2018: No hemodynamically significant arterial disease in the internal carotid artery bilaterally. Minimal plaque noted bilateral carotid arteries. Antegrade right vertebral artery flow. Antegrade left vertebral artery flow.  Remote CRT-D transmission 11/10/2021: AP 57%, CRT 97%. Longevity 7 years and 6 months. Lead impedance and thresholds within normal limits. There were no high ventricular rate episodes, no mode switches. Thoracic impedance is at baseline and does not suggest volume overload state. Normal function.   Assessment:     ICD-10-CM   1. Acute combined systolic and diastolic CHF, NYHA class 3 (HCC)  N56.21 Basic metabolic panel    Pro b natriuretic peptide (BNP)[id number])    2. Pure hypercholesterolemia  E78.00     3. Essential hypertension  I10       Recommendations:   Acute combined systolic and diastolic CHF, NYHA class 3 (HCC) Patient has gained an additional 8 pounds since last office visit She reports she has made significant diet changes reducing salt and calorie intake Weight gain may be related to fluid accumulation given significantly elevated NT proBNP. Physical examination note JVP minimal edema.  Patient is in fact relatively asymptomatic. However given weight gain and elevated BNP will increase torsemide to 40 mg twice daily for 3 days then 20 mg twice daily for an additional 3 days.  She may then return to taking torsemide as she is now (20 mg once daily). Will obtain repeat NT proBNP and BMP in 1 week. Reiterated the importance of low-sodium diet.  We will continue monitoring daily weights  and blood pressure remotely. Continue Farxiga, metoprolol, Entresto Given uncontrolled hypertension we will increase isosorbide dinitrate from 30 mg to 40 mg p.o. 3 times daily.  Ischemic cardiomyopathy Stable. Management as above  Status post coronary intervention and CRT-D. No additional cardiovascular testing warranted at this time.  Pure hypercholesterolemia In setting of coronary artery disease, ischemic cardiomyopathy, diabetes her LDL is currently not at goal. Last office visit Dr. Terri Skains started patient on Repatha, however it does not appear she is taking this. Will repeat lipid profile testing. Will reevaluate hypercholesterolemia next office visit. Continue statin and Zetia.  Biventricular ICD (implantable cardioverter-defibrillator) in place Most recent remote transmission 08/09/2021 independently reviewed. Thoracic impedance suggestive of CHF.   Management as above We will continue to monitor  Coronary artery disease involving native coronary artery of native heart without angina pectoris Chest pain-free. No use of sublingual nitroglycerin tablets. Continue present medical therapy  Type 2 diabetes mellitus with other circulatory complication, with long-term current use of insulin ([place]) Educated on importance of glycemic control. Currently on Arni, statin therapy, and Farxiga. Currently managed by primary care provider.   Current Outpatient Medications:    alendronate (FOSAMAX) 70 MG tablet, Take 1 tablet (70 mg total) by mouth once a week., Disp: 4 tablet, Rfl: 1   aspirin EC 81 MG tablet, Take  81 mg by mouth daily., Disp: , Rfl:    busPIRone (BUSPAR) 5 MG tablet, Take 5 mg by mouth 2 (two) times daily. As needed, Disp: , Rfl:    Calcium Carb-Cholecalciferol (CALCIUM 600 + D PO), Take 1 tablet by mouth daily. , Disp: , Rfl:    dapagliflozin propanediol (FARXIGA) 10 MG TABS tablet, Take 1 tablet (10 mg total) by mouth daily., Disp: 30 tablet, Rfl: 1   diclofenac Sodium  (VOLTAREN) 1 % GEL, APPLY 2 G TOPICALLY FOUR TIMES DAILY., Disp: 300 g, Rfl: 0   ezetimibe (ZETIA) 10 MG tablet, Take 10 mg by mouth daily., Disp: , Rfl:    gabapentin (NEURONTIN) 100 MG capsule, Take 100 mg by mouth at bedtime., Disp: , Rfl:    glucose blood test strip, 1 each 3 (three) times daily. , Disp: , Rfl:    insulin aspart (NOVOLOG FLEXPEN) 100 UNIT/ML FlexPen, Inject into the skin., Disp: , Rfl:    insulin degludec (TRESIBA) 100 UNIT/ML FlexTouch Pen, Inject into the skin., Disp: , Rfl:    levothyroxine (SYNTHROID, LEVOTHROID) 50 MCG tablet, Take 50 mcg by mouth daily before breakfast. , Disp: , Rfl:    Magnesium Oxide 500 MG TABS, TAKE 1 TABLET (500 MG TOTAL) BY MOUTH IN THE MORNING AND AT BEDTIME. (Patient taking differently: Take 500 mg by mouth in the morning, at noon, and at bedtime.), Disp: 180 tablet, Rfl: 1   methocarbamol (ROBAXIN) 500 MG tablet, Take 500 mg by mouth 4 (four) times daily., Disp: , Rfl:    metoprolol succinate (TOPROL-XL) 50 MG 24 hr tablet, TAKE 1 TABLET BY MOUTH EVERY DAY WITH OR IMMEDIATELY FOLLOWING A MEAL, Disp: 90 tablet, Rfl: 0   nitroGLYCERIN (NITROSTAT) 0.4 MG SL tablet, PLACE 1 TABLET UNDER THE TONGUE EVERY 5 MINUTES X 3 DOSES AS NEEDED FOR CHEST PAIN. (Patient taking differently: Place 0.4 mg under the tongue every 5 (five) minutes as needed for chest pain.), Disp: 25 tablet, Rfl: 2   ONETOUCH VERIO test strip, 1 each by Other route 3 (three) times daily., Disp: , Rfl: 5   rosuvastatin (CRESTOR) 40 MG tablet, Take 1 tablet (40 mg total) by mouth at bedtime., Disp: 90 tablet, Rfl: 3   torsemide (DEMADEX) 20 MG tablet, Take 20 mg by mouth every other day., Disp: , Rfl:    vitamin B-12 (CYANOCOBALAMIN) 1000 MCG tablet, Take 1,000 mcg by mouth daily. , Disp: , Rfl:    Vitamin D, Ergocalciferol, (DRISDOL) 50000 units CAPS capsule, Take 50,000 Units by mouth every 7 (seven) days. Take on Thursdays, Disp: , Rfl:    isosorbide dinitrate (ISORDIL) 40 MG tablet,  Take 1 tablet (40 mg total) by mouth 3 (three) times daily., Disp: 90 tablet, Rfl: 3   sacubitril-valsartan (ENTRESTO) 97-103 MG, Take 1 tablet by mouth 2 (two) times daily., Disp: 180 tablet, Rfl: 3  Orders Placed This Encounter  Procedures   Basic metabolic panel   Pro b natriuretic peptide (BNP)[facility] [facility])   --Continue cardiac medications as reconciled in final medication list. --Return in about 2 weeks (around 12/16/2021) for HF . Or sooner if needed. --Continue follow-up with your primary care physician regarding the management of your other chronic comorbid conditions.  Patient's questions and concerns were addressed to her satisfaction. She voices understanding of the instructions provided during this encounter.   This note was created using a voice recognition software as a result there may be grammatical errors inadvertently enclosed that do not reflect the  nature of this encounter. Every attempt is made to correct such errors.  Total time spent: 32 minutes.   Alethia Berthold, PA-C 12/02/2021, 1:08 PM Office: [phone]

## 2021-12-10 ENCOUNTER — Ambulatory Visit: Payer: Medicare PPO | Admitting: Cardiology

## 2021-12-11 ENCOUNTER — Encounter (INDEPENDENT_AMBULATORY_CARE_PROVIDER_SITE_OTHER): Payer: Medicare PPO | Admitting: Ophthalmology

## 2021-12-13 LAB — BASIC METABOLIC PANEL
BUN/Creatinine Ratio: 21 (ref 12–28)
BUN: 28 mg/dL — ABNORMAL HIGH (ref 8–27)
CO2: 23 mmol/L (ref 20–29)
Calcium: 9.1 mg/dL (ref 8.7–10.3)
Chloride: 104 mmol/L (ref 96–106)
Creatinine, Ser: 1.36 mg/dL — ABNORMAL HIGH (ref 0.57–1.00)
Glucose: 257 mg/dL — ABNORMAL HIGH (ref 70–99)
Potassium: 3.8 mmol/L (ref 3.5–5.2)
Sodium: 145 mmol/L — ABNORMAL HIGH (ref 134–144)
eGFR: 39 mL/min/{1.73_m2} — ABNORMAL LOW (ref >59)

## 2021-12-13 LAB — PRO B NATRIURETIC PEPTIDE: NT-Pro BNP: 3003 pg/mL — ABNORMAL HIGH (ref 0–738)

## 2021-12-13 NOTE — Progress Notes (Signed)
Primary Physician:  [name], PA-C   [name], female    DOB: 01/02/1941, 81 y.o.   MRN: [id number]   Date: 12/16/21 Last Office Visit: 05/24/2021   Subjective:    Chief Complaint  Patient presents with   Acute combined systolic and diastolic CHF   Follow-up   Results    HPI: FOLASADE MOOTY  is a 81 y.o. female  with hypertension, insulin-dependent diabetes mellitus type 2,  CAD s/p prior LAD and ramus PCI,  HFrEF, ischemic cardiomyopathy, s/p BiV ICD implantation 06/2019,  h/o Rt leg cellulitis and metatarsal fracture (managed by Podiatry) with chief complaint of " 3 month followup for congestive heart failure."   Last hospitalized for congestive heart failure in March 2022 most likely secondary to dietary indiscretion after losing her son/grieving process.  During the hospitalization she had diuresed a total net negative urine output of 8 L.  And since then her medications have been uptitrated.  In October 2022 spironolactone was discontinued due to hyperkalemia and Entresto was uptitrated to 97/23 mg twice daily.  Subsequently pacemaker transmission on 11/23/2021 showed thoracic impedance dropping suggestive of volume overload.  She was therefore brought in for earlier than scheduled office visit for further evaluation and management.  At that time NT proBNP elevated at 6748.  Patient was last seen in our office 12/02/2021 at which time increased torsemide to 40 mg twice daily for 3 days, 20 mg twice daily for an additional 3 days, then to return to torsemide 20 mg once daily.  Repeat BMP showed increase of creatinine from 0.99-1.36, NT proBNP improved to 3000.  However review of renal function over the last 6 months shows that baseline creatinine has been 1.2-1.3, therefore it is relatively stable.  Patient has continue torsemide 20 mg once daily.  Her weight has trended back down to baseline.  She is enrolled in remote patient monitoring for blood pressure which  although it remains elevated above goal is trending down.  Patient reports she is feeling quite well overall with improvement of dyspnea and fatigue.  She notably has upcoming appointment with PCP and plans to have lipid profile testing and likely repeat BMP done.  Past Medical History:  Diagnosis Date   Acute combined systolic and diastolic heart failure ([place]) 04/25/2018   Anxiety    Arthritis    "hands" (12/29/2017)   Basal cell carcinoma of nose    removed   Bursitis of left shoulder    BV ICD [employer] [place] MRI CRTD 06/21/2019   Cat scratch fever    Late 90s   Chronic combined systolic and diastolic CHF (congestive heart failure) ([place]) 06/08/2019   Coronary artery disease    Depression    Diabetes mellitus, type II, insulin dependent ([place])    Encounter for assessment of implantable cardioverter-defibrillator (ICD) 09/28/2019   GERD (gastroesophageal reflux disease)    H. pylori infection 2008 and 1998   treated   Hyperlipidemia    Hypertension    Hypothyroidism    Low oxygen saturation    Macular degeneration    Multinodular goiter    Rotator cuff tear, left recurrent    Urge urinary incontinence    UTI (lower urinary tract infection) 05/2016   Family History  Problem Relation Age of Onset   Cancer Mother        breast   Heart Problems Mother    Prostate cancer Father    Muscular dystrophy Son  Lung cancer Son    Cancer Maternal Grandmother        breast   Heart disease Maternal Grandfather    Breast cancer Sister    Past Surgical History:  Procedure Laterality Date   APPENDECTOMY  AGE 67   BACK SURGERY     BASAL CELL CARCINOMA EXCISION     "nose"   BIV ICD INSERTION CRT-D N/A 06/21/2019   Procedure: BIV ICD INSERTION CRT-D;  Surgeon: Evans Lance, MD;  Location: [facility];  Service: Cardiovascular;  Laterality: N/A;   BLADDER NECK SUSPENSION  1970's   BUNIONECTOMY WITH HAMMERTOE RECONSTRUCTION Bilateral    CARPAL TUNNEL RELEASE Right 05/2017    COLONOSCOPY W/ POLYPECTOMY     CORONARY ANGIOPLASTY WITH STENT PLACEMENT  12/29/2017   CORONARY BALLOON ANGIOPLASTY N/A 02/08/2019   Procedure: CORONARY BALLOON ANGIOPLASTY;  Surgeon: Adrian Prows, MD;  Location: [facility];  Service: Cardiovascular;  Laterality: N/A;   CORONARY STENT INTERVENTION N/A 12/29/2017   Procedure: CORONARY STENT INTERVENTION;  Surgeon: Nigel Mormon, MD;  Location: [facility];  Service: Cardiovascular;  Laterality: N/A;   CORONARY STENT INTERVENTION N/A 04/26/2018   Procedure: CORONARY STENT INTERVENTION;  Surgeon: Nigel Mormon, MD;  Location: [facility];  Service: Cardiovascular;  Laterality: N/A;   CORONARY STENT INTERVENTION N/A 02/08/2019   Procedure: CORONARY STENT INTERVENTION;  Surgeon: Adrian Prows, MD;  Location: [facility];  Service: Cardiovascular;  Laterality: N/A;   ESOPHAGOGASTRODUODENOSCOPY ENDOSCOPY     FORAMINAL DECOMPRESSION AT L2 TO THE SACRUM  01-05-2008   L2  -  S1   GANGLION CYST EXCISION Left 01/17/2009   ring finger   IMPLANTATION PERMANENT SPINAL CORD STIMULATOR  06-15-2008   JUNE 2013--  BATTERY CHANGE   JOINT REPLACEMENT     LEFT HEART CATH AND CORONARY ANGIOGRAPHY N/A 04/26/2018   Procedure: LEFT HEART CATH AND CORONARY ANGIOGRAPHY;  Surgeon: Nigel Mormon, MD;  Location: [facility];  Service: Cardiovascular;  Laterality: N/A;   LEFT HEART CATH AND CORONARY ANGIOGRAPHY N/A 02/08/2019   Procedure: LEFT HEART CATH AND CORONARY ANGIOGRAPHY;  Surgeon: Adrian Prows, MD;  Location: [facility];  Service: Cardiovascular;  Laterality: N/A;   LIPOMA EXCISION Right    RIGHT ELBOW   METATARSAL HEAD EXCISION Right 06/16/2018   Procedure: right 5th metatarsal excision, a mini c-arm;  Surgeon: Trula Slade, DPM;  Location: [facility];  Service: Podiatry;  Laterality: Right;  anesthesia can do block   RIGHT/LEFT HEART CATH AND CORONARY ANGIOGRAPHY N/A 12/29/2017   Procedure: RIGHT/LEFT  HEART CATH AND CORONARY ANGIOGRAPHY;  Surgeon: Nigel Mormon, MD;  Location: [facility];  Service: Cardiovascular;  Laterality: N/A;   SHOULDER OPEN ROTATOR CUFF REPAIR  09/07/2012   Procedure: ROTATOR CUFF REPAIR SHOULDER OPEN;  Surgeon: Magnus Sinning, MD;  Location: [facility];  Service: Orthopedics;  Laterality: Left;  OPEN ANTERIOR ACROMIONECTOMY AND ROTATOR CUFF REPAIR ON LEFT    SHOULDER OPEN ROTATOR CUFF REPAIR Left 12/28/2012   Procedure: ROTATOR CUFF REPAIR SHOULDER OPEN;  Surgeon: Magnus Sinning, MD;  Location: [facility];  Service: Orthopedics;  Laterality: Left;  OPEN SHOULDER ROTATOR CUFF REPAIR ON LEFT  WITH ANTERIOR ACROMINECTOMY    SHOULDER OPEN ROTATOR CUFF REPAIR Right 03/28/2003   RIGHT SHOULDER  DEGENERATIVE AC JOINT AND RC TEAR   SPINAL CORD STIMULATOR BATTERY EXCHANGE N/A 07/03/2016   Procedure: SPINAL CORD  STIMULATOR BATTERY PLACMENT;  Surgeon: Melina Schools, MD;  Location: [facility];  Service: Orthopedics;  Laterality: N/A;   TONSILLECTOMY  AGE 77   TOTAL KNEE ARTHROPLASTY Right 05/06/2000   OA RIGHT KNEE   VAGINAL HYSTERECTOMY  1970's   Social History   Tobacco Use   Smoking status: Never   Smokeless tobacco: Never  Vaping Use   Vaping Use: Never used  Substance Use Topics   Alcohol use: No   Drug use: No    Review of Systems  Constitutional: Positive for weight loss. Negative for chills, decreased appetite and malaise/fatigue.  Cardiovascular:  Negative for chest pain, dyspnea on exertion, leg swelling, orthopnea and syncope.  Respiratory:  Negative for cough.   Endocrine: Negative for cold intolerance.  Hematologic/Lymphatic: Does not bruise/bleed easily.  Musculoskeletal:  Negative for falls and joint swelling.  Gastrointestinal:  Negative for abdominal pain, anorexia, change in bowel habit, diarrhea and nausea.  Neurological:  Negative for headaches and light-headedness.  Psychiatric/Behavioral:  Negative  for depression and substance abuse.   All other systems reviewed and are negative.    Objective:   Vitals with BMI 12/16/2021 12/02/2021 08/27/2021  Height 5\' 2"  5\' 2"  -  Weight 202 lbs 209 lbs -  BMI 16.96 78.93 -  Systolic 810 175 102  Diastolic 66 84 53  Pulse 50 68 50   CONSTITUTIONAL: Appears older than stated age, hemodynamically stable, no acute distress.  SKIN: Skin is warm and dry. No rash noted. No cyanosis. No pallor. No jaundice HEAD: Normocephalic and atraumatic.  EYES: No scleral icterus MOUTH/THROAT: Moist oral membranes.  NECK: No JVD present. No thyromegaly noted.  Bilateral carotid bruits  LYMPHATIC: No visible cervical adenopathy.  CHEST Normal respiratory effort. No intercostal retractions.  BiV ICD site is clean dry and intact LUNGS: Clear to auscultation bilaterally.  No stridor. No wheezes. No rales.  CARDIOVASCULAR: Regular, positive H8-N2, soft holosystolic murmur heard at the apex, no gallops or rubs ABDOMINAL: Soft, nontender, nondistended, positive bowel sounds in all 4 quadrants, no apparent ascites.  EXTREMITIES: trace bilateral peripheral edema  HEMATOLOGIC: No significant bruising NEUROLOGIC: Oriented to person, place, and time. Nonfocal. Normal muscle tone.  PSYCHIATRIC: Normal mood and affect. Normal behavior. Cooperative  Laboratory examination:   CMP Latest Ref Rng & Units 12/12/2021 11/27/2021 08/27/2021  Glucose 70 - 99 mg/dL 257(H) 97 153(H)  BUN 8 - 27 mg/dL 28(H) 20 40(H)  Creatinine 0.57 - 1.00 mg/dL 1.36(H) 0.99 1.37(H)  Sodium 134 - 144 mmol/L 145(H) 143 143  Potassium 3.5 - 5.2 mmol/L 3.8 3.5 4.1  Chloride 96 - 106 mmol/L 104 108(H) 105  CO2 20 - 29 mmol/L 23 22 23   Calcium 8.7 - 10.3 mg/dL 9.1 8.5(L) 10.0  Total Protein 6.0 - 8.5 g/dL - - -  Total Bilirubin 0.0 - 1.2 mg/dL - - -  Alkaline Phos 39 - 117 IU/L - - -  AST 0 - 40 IU/L - - -  ALT 0 - 32 IU/L - - -   CBC Latest Ref Rng & Units 06/07/2021 02/12/2021 02/11/2021  WBC 4.0 -  10.5 K/uL 5.0 4.1 4.4  Hemoglobin 12.0 - 15.0 g/dL 12.4 11.9(L) 13.1  Hematocrit 36.0 - 46.0 % 36.6 36.5 41.0  Platelets 150 - 400 K/uL 102(L) 92(L) 94(L)   Lipid Panel  Lab Results  Component Value Date   CHOL 203 (H) 06/03/2021   HDL 68 06/03/2021   LDLCALC 119 (H) 06/03/2021   LDLDIRECT 112 (H) 06/03/2021  TRIG 88 06/03/2021   CHOLHDL 2.9 12/26/2019    HEMOGLOBIN A1C Lab Results  Component Value Date   HGBA1C 9.8 (H) 02/12/2021   MPG 234.56 02/12/2021   TSH No results for input(s): TSH in the last 8760 hours. BNP (last 3 results) Recent Labs    02/11/21 2256 02/13/21 0218 02/14/21 0026  BNP 4,088.9* 2,726.8* 1,477.4*    ProBNP (last 3 results) Recent Labs    08/27/21 1436 11/27/21 1033 12/12/21 1316  PROBNP 3,833* 6,748* 3,003*   PRN Meds:. There are no discontinued medications.   Current Meds  Medication Sig   alendronate (FOSAMAX) 70 MG tablet Take 1 tablet (70 mg total) by mouth once a week.   aspirin EC 81 MG tablet Take 81 mg by mouth daily.   busPIRone (BUSPAR) 5 MG tablet Take 5 mg by mouth 2 (two) times daily. As needed   Calcium Carb-Cholecalciferol (CALCIUM 600 + D PO) Take 1 tablet by mouth daily.    dapagliflozin propanediol (FARXIGA) 10 MG TABS tablet Take 1 tablet (10 mg total) by mouth daily.   diclofenac Sodium (VOLTAREN) 1 % GEL APPLY 2 G TOPICALLY FOUR TIMES DAILY.   ezetimibe (ZETIA) 10 MG tablet Take 10 mg by mouth daily.   gabapentin (NEURONTIN) 100 MG capsule Take 100 mg by mouth at bedtime.   glucose blood test strip 1 each 3 (three) times daily.    insulin aspart (NOVOLOG FLEXPEN) 100 UNIT/ML FlexPen Inject into the skin.   insulin degludec (TRESIBA) 100 UNIT/ML FlexTouch Pen Inject into the skin.   isosorbide dinitrate (ISORDIL) 40 MG tablet Take 1 tablet (40 mg total) by mouth 3 (three) times daily.   levothyroxine (SYNTHROID, LEVOTHROID) 50 MCG tablet Take 50 mcg by mouth daily before breakfast.    Magnesium Oxide 500 MG TABS  TAKE 1 TABLET (500 MG TOTAL) BY MOUTH IN THE MORNING AND AT BEDTIME. (Patient taking differently: Take 500 mg by mouth in the morning, at noon, and at bedtime.)   methocarbamol (ROBAXIN) 500 MG tablet Take 500 mg by mouth 4 (four) times daily.   metoprolol succinate (TOPROL-XL) 50 MG 24 hr tablet TAKE 1 TABLET BY MOUTH EVERY DAY WITH OR IMMEDIATELY FOLLOWING A MEAL   nitroGLYCERIN (NITROSTAT) 0.4 MG SL tablet PLACE 1 TABLET UNDER THE TONGUE EVERY 5 MINUTES X 3 DOSES AS NEEDED FOR CHEST PAIN. (Patient taking differently: Place 0.4 mg under the tongue every 5 (five) minutes as needed for chest pain.)   ONETOUCH VERIO test strip 1 each by Other route 3 (three) times daily.   sacubitril-valsartan (ENTRESTO) 97-103 MG Take 1 tablet by mouth 2 (two) times daily.   torsemide (DEMADEX) 20 MG tablet Take 20 mg by mouth every other day.   vitamin B-12 (CYANOCOBALAMIN) 1000 MCG tablet Take 1,000 mcg by mouth daily.    Vitamin D, Ergocalciferol, (DRISDOL) 50000 units CAPS capsule Take 50,000 Units by mouth every 7 (seven) days. Take on Thursdays    Radiology:  No results found.  Cardiac Studies:   EKG: 08/27/2021: Atrial and ventricular paced rhythm.   Echocardiogram: 02/12/2021: LVEF 20-25%, severely reduced left ventricular systolic function, global hypokinesis, LV size moderately dilated, mild LVH, right ventricular systolic function moderately reduced, RV size mildly enlarged, PASP 68 mmHg, severe left atrial dilatation, moderately dilated right atrium, moderate to severe MR, moderate to severe TR, mild AR, IVC dilated estimated RAP 15 mmHg.   Stress test: None   Heart catheterization: Coronary angiogram 02/08/2019:  Distal left main 20% diffuse  disease.  Ostial LAD stent shows diffuse 20% in-stent restenosis (3.0 x 15 mm resolute Onyx on 12/29/2017).  Mid LAD and mid to distal LAD there are tandem lesions 80% and 95%.  Ramus intermediate ostial 95 to 99% stenosis.  Previously placed RI stent  widely patent ( 2.0 x 12 mm resolute on 04/26/2018).  30 to 40% tandem lesions in the RCA. Cutting Balloon angioplasty of the ramus intermediate ostial high-grade stenosis, 99% reduced to 0% and Cutting Balloon angioplasty of the mid LAD followed by stenting with 2.5 x 32 mm Synergy DES for high-grade 90% stenosis reduced to 0% and TIMI-3 to TIMI-3 flow maintained in both lesions.   Carotid artery duplex 12/02/2018: No hemodynamically significant arterial disease in the internal carotid artery bilaterally. Minimal plaque noted bilateral carotid arteries. Antegrade right vertebral artery flow. Antegrade left vertebral artery flow.  Remote CRT-D transmission 11/10/2021: AP 57%, CRT 97%. Longevity 7 years and 6 months. Lead impedance and thresholds within normal limits. There were no high ventricular rate episodes, no mode switches. Thoracic impedance is at baseline and does not suggest volume overload state. Normal function.   Assessment:     ICD-10-CM   1. Acute combined systolic and diastolic CHF, NYHA class 3 (HCC)  I50.41     2. Pure hypercholesterolemia  E78.00     3. Essential hypertension  I10        Recommendations:   Acute combined systolic and diastolic CHF, NYHA class 3 (HCC) Patient's weight has trended down and symptoms of dyspnea have improved. Courage patient to continue with reducing salt and calorie intake Patient is presently relatively asymptomatic.  We will continue torsemide 20 mg once daily Blood pressure remains elevated above goal, however is within relatively acceptable range.  She is enrolled in remote patient monitoring and blood pressure has been trending down recently.  Given advanced age we will hold off on making additional antihypertensive medication changes.  She is tolerating increased Isordil without issue, will continue this. Continue Farxiga, metoprolol, Entresto.  Ischemic cardiomyopathy Stable. Management as above  Status post coronary  intervention and CRT-D. No additional cardiovascular testing warranted at this time.  Pure hypercholesterolemia In setting of coronary artery disease, ischemic cardiomyopathy, diabetes her LDL is currently not at goal. She has upcoming appointment with PCP would like to defer repeat lipid profile testing to their office.  I have requested that lipid profile testing results be sent to our office for further evaluation. Patient previously been started on Repatha, however she has not been taking this.  We will reevaluate need for Repatha when repeat lipid profile testing is available for review Continue statin and Zetia at this time  Biventricular ICD (implantable cardioverter-defibrillator) in place We will continue to monitor  Coronary artery disease involving native coronary artery of native heart without angina pectoris Chest pain-free. No use of sublingual nitroglycerin tablets. Continue present medical therapy  Type 2 diabetes mellitus with other circulatory complication, with long-term current use of insulin ([place]) Educated on importance of glycemic control. Currently on Arni, statin therapy, and Farxiga. Currently managed by primary care provider.   Current Outpatient Medications:    alendronate (FOSAMAX) 70 MG tablet, Take 1 tablet (70 mg total) by mouth once a week., Disp: 4 tablet, Rfl: 1   aspirin EC 81 MG tablet, Take 81 mg by mouth daily., Disp: , Rfl:    busPIRone (BUSPAR) 5 MG tablet, Take 5 mg by mouth 2 (two) times daily. As needed, Disp: , Rfl:  Calcium Carb-Cholecalciferol (CALCIUM 600 + D PO), Take 1 tablet by mouth daily. , Disp: , Rfl:    dapagliflozin propanediol (FARXIGA) 10 MG TABS tablet, Take 1 tablet (10 mg total) by mouth daily., Disp: 30 tablet, Rfl: 1   diclofenac Sodium (VOLTAREN) 1 % GEL, APPLY 2 G TOPICALLY FOUR TIMES DAILY., Disp: 300 g, Rfl: 0   ezetimibe (ZETIA) 10 MG tablet, Take 10 mg by mouth daily., Disp: , Rfl:    gabapentin (NEURONTIN) 100 MG  capsule, Take 100 mg by mouth at bedtime., Disp: , Rfl:    glucose blood test strip, 1 each 3 (three) times daily. , Disp: , Rfl:    insulin aspart (NOVOLOG FLEXPEN) 100 UNIT/ML FlexPen, Inject into the skin., Disp: , Rfl:    insulin degludec (TRESIBA) 100 UNIT/ML FlexTouch Pen, Inject into the skin., Disp: , Rfl:    isosorbide dinitrate (ISORDIL) 40 MG tablet, Take 1 tablet (40 mg total) by mouth 3 (three) times daily., Disp: 90 tablet, Rfl: 3   levothyroxine (SYNTHROID, LEVOTHROID) 50 MCG tablet, Take 50 mcg by mouth daily before breakfast. , Disp: , Rfl:    Magnesium Oxide 500 MG TABS, TAKE 1 TABLET (500 MG TOTAL) BY MOUTH IN THE MORNING AND AT BEDTIME. (Patient taking differently: Take 500 mg by mouth in the morning, at noon, and at bedtime.), Disp: 180 tablet, Rfl: 1   methocarbamol (ROBAXIN) 500 MG tablet, Take 500 mg by mouth 4 (four) times daily., Disp: , Rfl:    metoprolol succinate (TOPROL-XL) 50 MG 24 hr tablet, TAKE 1 TABLET BY MOUTH EVERY DAY WITH OR IMMEDIATELY FOLLOWING A MEAL, Disp: 90 tablet, Rfl: 0   nitroGLYCERIN (NITROSTAT) 0.4 MG SL tablet, PLACE 1 TABLET UNDER THE TONGUE EVERY 5 MINUTES X 3 DOSES AS NEEDED FOR CHEST PAIN. (Patient taking differently: Place 0.4 mg under the tongue every 5 (five) minutes as needed for chest pain.), Disp: 25 tablet, Rfl: 2   ONETOUCH VERIO test strip, 1 each by Other route 3 (three) times daily., Disp: , Rfl: 5   sacubitril-valsartan (ENTRESTO) 97-103 MG, Take 1 tablet by mouth 2 (two) times daily., Disp: 180 tablet, Rfl: 3   torsemide (DEMADEX) 20 MG tablet, Take 20 mg by mouth every other day., Disp: , Rfl:    vitamin B-12 (CYANOCOBALAMIN) 1000 MCG tablet, Take 1,000 mcg by mouth daily. , Disp: , Rfl:    Vitamin D, Ergocalciferol, (DRISDOL) 50000 units CAPS capsule, Take 50,000 Units by mouth every 7 (seven) days. Take on Thursdays, Disp: , Rfl:    rosuvastatin (CRESTOR) 40 MG tablet, Take 1 tablet (40 mg total) by mouth at bedtime., Disp: 90  tablet, Rfl: 3  No orders of the defined types were placed in this encounter.  --Continue cardiac medications as reconciled in final medication list. --Return in about 3 months (around 03/16/2022) for CAD, HFrEF, . Or sooner if needed. --Continue follow-up with your primary care physician regarding the management of your other chronic comorbid conditions.  Patient's questions and concerns were addressed to her satisfaction. She voices understanding of the instructions provided during this encounter.   This note was created using a voice recognition software as a result there may be grammatical errors inadvertently enclosed that do not reflect the nature of this encounter. Every attempt is made to correct such errors.   Alethia Berthold, PA-C 12/16/2021, 10:54 AM Office: [phone]

## 2021-12-16 ENCOUNTER — Ambulatory Visit: Payer: Medicare PPO | Admitting: Student

## 2021-12-16 ENCOUNTER — Encounter: Payer: Self-pay | Admitting: Student

## 2021-12-16 ENCOUNTER — Other Ambulatory Visit: Payer: Self-pay

## 2021-12-16 VITALS — BP 144/66 | HR 50 | Temp 97.8°F | Ht 62.0 in | Wt 202.0 lb

## 2021-12-16 DIAGNOSIS — I10 Essential (primary) hypertension: Secondary | ICD-10-CM

## 2021-12-16 DIAGNOSIS — E7800 Pure hypercholesterolemia, unspecified: Secondary | ICD-10-CM

## 2021-12-16 DIAGNOSIS — I5041 Acute combined systolic (congestive) and diastolic (congestive) heart failure: Secondary | ICD-10-CM

## 2021-12-17 ENCOUNTER — Encounter (INDEPENDENT_AMBULATORY_CARE_PROVIDER_SITE_OTHER): Payer: Self-pay | Admitting: Ophthalmology

## 2021-12-17 ENCOUNTER — Ambulatory Visit (INDEPENDENT_AMBULATORY_CARE_PROVIDER_SITE_OTHER): Payer: Medicare PPO | Admitting: Ophthalmology

## 2021-12-17 DIAGNOSIS — E113412 Type 2 diabetes mellitus with severe nonproliferative diabetic retinopathy with macular edema, left eye: Secondary | ICD-10-CM | POA: Diagnosis not present

## 2021-12-17 DIAGNOSIS — E113511 Type 2 diabetes mellitus with proliferative diabetic retinopathy with macular edema, right eye: Secondary | ICD-10-CM | POA: Diagnosis not present

## 2021-12-17 MED ORDER — BEVACIZUMAB 2.5 MG/0.1ML IZ SOSY
2.5000 mg | PREFILLED_SYRINGE | INTRAVITREAL | Status: AC | PRN
  Administered 2021-12-17: 2.5 mg via INTRAVITREAL

## 2021-12-17 NOTE — Assessment & Plan Note (Signed)
At 7-week follow-up interval today still improving CSME will need repeat injection today

## 2021-12-17 NOTE — Assessment & Plan Note (Signed)
At follow-up interval today of 9 weeks, with massive increase in CSME will need prompt injection evaluation soon on the right I

## 2021-12-17 NOTE — Progress Notes (Signed)
12/17/2021     CHIEF COMPLAINT Patient presents for  Chief Complaint  Patient presents with   Retina Follow Up      HISTORY OF PRESENT ILLNESS: Sara Graves is a 81 y.o. female who presents to the clinic today for:   HPI     Retina Follow Up           Diagnosis: Diabetic Retinopathy   Laterality: left eye   Onset: 8   Severity: mild   Duration: 8   Course: stable         Comments   8 week Dilate OS Avastin OCT. Patient states vision is stable and unchanged since last visit. Denies any new floaters or FOL.       Last edited by Laurin Coder on 12/17/2021  9:48 AM.      Referring physician: [name], PA-C [place],  [place] [address]  HISTORICAL INFORMATION:   Selected notes from the MEDICAL RECORD NUMBER    Lab Results  Component Value Date   HGBA1C 9.8 (H) 02/12/2021     CURRENT MEDICATIONS: No current outpatient medications on file. (Ophthalmic Drugs)   No current facility-administered medications for this visit. (Ophthalmic Drugs)   Current Outpatient Medications (Other)  Medication Sig   alendronate (FOSAMAX) 70 MG tablet Take 1 tablet (70 mg total) by mouth once a week.   aspirin EC 81 MG tablet Take 81 mg by mouth daily.   busPIRone (BUSPAR) 5 MG tablet Take 5 mg by mouth 2 (two) times daily. As needed   Calcium Carb-Cholecalciferol (CALCIUM 600 + D PO) Take 1 tablet by mouth daily.    dapagliflozin propanediol (FARXIGA) 10 MG TABS tablet Take 1 tablet (10 mg total) by mouth daily.   diclofenac Sodium (VOLTAREN) 1 % GEL APPLY 2 G TOPICALLY FOUR TIMES DAILY.   ezetimibe (ZETIA) 10 MG tablet Take 10 mg by mouth daily.   gabapentin (NEURONTIN) 100 MG capsule Take 100 mg by mouth at bedtime.   glucose blood test strip 1 each 3 (three) times daily.    insulin aspart (NOVOLOG FLEXPEN) 100 UNIT/ML FlexPen Inject into the skin.   insulin degludec (TRESIBA) 100 UNIT/ML FlexTouch Pen Inject into the skin.   isosorbide  dinitrate (ISORDIL) 40 MG tablet Take 1 tablet (40 mg total) by mouth 3 (three) times daily.   levothyroxine (SYNTHROID, LEVOTHROID) 50 MCG tablet Take 50 mcg by mouth daily before breakfast.    Magnesium Oxide 500 MG TABS TAKE 1 TABLET (500 MG TOTAL) BY MOUTH IN THE MORNING AND AT BEDTIME. (Patient taking differently: Take 500 mg by mouth in the morning, at noon, and at bedtime.)   methocarbamol (ROBAXIN) 500 MG tablet Take 500 mg by mouth 4 (four) times daily.   metoprolol succinate (TOPROL-XL) 50 MG 24 hr tablet TAKE 1 TABLET BY MOUTH EVERY DAY WITH OR IMMEDIATELY FOLLOWING A MEAL   nitroGLYCERIN (NITROSTAT) 0.4 MG SL tablet PLACE 1 TABLET UNDER THE TONGUE EVERY 5 MINUTES X 3 DOSES AS NEEDED FOR CHEST PAIN. (Patient taking differently: Place 0.4 mg under the tongue every 5 (five) minutes as needed for chest pain.)   ONETOUCH VERIO test strip 1 each by Other route 3 (three) times daily.   rosuvastatin (CRESTOR) 40 MG tablet Take 1 tablet (40 mg total) by mouth at bedtime.   sacubitril-valsartan (ENTRESTO) 97-103 MG Take 1 tablet by mouth 2 (two) times daily.   torsemide (DEMADEX) 20 MG tablet Take 20 mg  by mouth every other day.   vitamin B-12 (CYANOCOBALAMIN) 1000 MCG tablet Take 1,000 mcg by mouth daily.    Vitamin D, Ergocalciferol, (DRISDOL) 50000 units CAPS capsule Take 50,000 Units by mouth every 7 (seven) days. Take on Thursdays   No current facility-administered medications for this visit. (Other)      REVIEW OF SYSTEMS:    ALLERGIES Allergies  Allergen Reactions   Rosiglitazone Maleate Anaphylaxis and Swelling   Morphine Other (See Comments)    SEVERE HYPOTENSION    Tramadol Other (See Comments)    Unknown reaction   Cephalexin Diarrhea   Elavil [Amitriptyline] Nausea Only   Sulfa Antibiotics Diarrhea and Nausea And Vomiting   Tramadol-Acetaminophen Rash    PAST MEDICAL HISTORY Past Medical History:  Diagnosis Date   Acute combined systolic and diastolic heart  failure ([place]) 04/25/2018   Anxiety    Arthritis    "hands" (12/29/2017)   Basal cell carcinoma of nose    removed   Bursitis of left shoulder    BV ICD [employer] [place] MRI CRTD 06/21/2019   Cat scratch fever    Late 90s   Chronic combined systolic and diastolic CHF (congestive heart failure) ([place] [facility]) 06/08/2019   Coronary artery disease    Depression    Diabetes mellitus, type II, insulin dependent ([place])    Encounter for assessment of implantable cardioverter-defibrillator (ICD) 09/28/2019   GERD (gastroesophageal reflux disease)    H. pylori infection 2008 and 1998   treated   Hyperlipidemia    Hypertension    Hypothyroidism    Low oxygen saturation    Macular degeneration    Multinodular goiter    Rotator cuff tear, left recurrent    Urge urinary incontinence    UTI (lower urinary tract infection) 05/2016   Past Surgical History:  Procedure Laterality Date   APPENDECTOMY  AGE 81   BACK SURGERY     BASAL CELL CARCINOMA EXCISION     "nose"   BIV ICD INSERTION CRT-D N/A 06/21/2019   Procedure: BIV ICD INSERTION CRT-D;  Surgeon: Evans Lance, MD;  Location: [facility];  Service: Cardiovascular;  Laterality: N/A;   BLADDER NECK SUSPENSION  1970's   BUNIONECTOMY WITH HAMMERTOE RECONSTRUCTION Bilateral    CARPAL TUNNEL RELEASE Right 05/2017   COLONOSCOPY W/ POLYPECTOMY     CORONARY ANGIOPLASTY WITH STENT PLACEMENT  12/29/2017   CORONARY BALLOON ANGIOPLASTY N/A 02/08/2019   Procedure: CORONARY BALLOON ANGIOPLASTY;  Surgeon: Adrian Prows, MD;  Location: [facility];  Service: Cardiovascular;  Laterality: N/A;   CORONARY STENT INTERVENTION N/A 12/29/2017   Procedure: CORONARY STENT INTERVENTION;  Surgeon: Nigel Mormon, MD;  Location: [facility];  Service: Cardiovascular;  Laterality: N/A;   CORONARY STENT INTERVENTION N/A 04/26/2018   Procedure: CORONARY STENT INTERVENTION;  Surgeon: Nigel Mormon, MD;  Location: [facility];  Service:  Cardiovascular;  Laterality: N/A;   CORONARY STENT INTERVENTION N/A 02/08/2019   Procedure: CORONARY STENT INTERVENTION;  Surgeon: Adrian Prows, MD;  Location: [facility];  Service: Cardiovascular;  Laterality: N/A;   ESOPHAGOGASTRODUODENOSCOPY ENDOSCOPY     FORAMINAL DECOMPRESSION AT L2 TO THE SACRUM  01-05-2008   L2  -  S1   GANGLION CYST EXCISION Left 01/17/2009   ring finger   IMPLANTATION PERMANENT SPINAL CORD STIMULATOR  06-15-2008   JUNE 2013--  BATTERY CHANGE   JOINT REPLACEMENT     LEFT HEART CATH AND CORONARY ANGIOGRAPHY N/A 04/26/2018  Procedure: LEFT HEART CATH AND CORONARY ANGIOGRAPHY;  Surgeon: Nigel Mormon, MD;  Location: [facility];  Service: Cardiovascular;  Laterality: N/A;   LEFT HEART CATH AND CORONARY ANGIOGRAPHY N/A 02/08/2019   Procedure: LEFT HEART CATH AND CORONARY ANGIOGRAPHY;  Surgeon: Adrian Prows, MD;  Location: [facility];  Service: Cardiovascular;  Laterality: N/A;   LIPOMA EXCISION Right    RIGHT ELBOW   METATARSAL HEAD EXCISION Right 06/16/2018   Procedure: right 5th metatarsal excision, a mini c-arm;  Surgeon: Trula Slade, DPM;  Location: [facility];  Service: Podiatry;  Laterality: Right;  anesthesia can do block   RIGHT/LEFT HEART CATH AND CORONARY ANGIOGRAPHY N/A 12/29/2017   Procedure: RIGHT/LEFT HEART CATH AND CORONARY ANGIOGRAPHY;  Surgeon: Nigel Mormon, MD;  Location: [facility];  Service: Cardiovascular;  Laterality: N/A;   SHOULDER OPEN ROTATOR CUFF REPAIR  09/07/2012   Procedure: ROTATOR CUFF REPAIR SHOULDER OPEN;  Surgeon: Magnus Sinning, MD;  Location: [facility];  Service: Orthopedics;  Laterality: Left;  OPEN ANTERIOR ACROMIONECTOMY AND ROTATOR CUFF REPAIR ON LEFT    SHOULDER OPEN ROTATOR CUFF REPAIR Left 12/28/2012   Procedure: ROTATOR CUFF REPAIR SHOULDER OPEN;  Surgeon: Magnus Sinning, MD;  Location: [facility];  Service: Orthopedics;  Laterality: Left;  OPEN  SHOULDER ROTATOR CUFF REPAIR ON LEFT  WITH ANTERIOR ACROMINECTOMY    SHOULDER OPEN ROTATOR CUFF REPAIR Right 03/28/2003   RIGHT SHOULDER  DEGENERATIVE AC JOINT AND RC TEAR   SPINAL CORD STIMULATOR BATTERY EXCHANGE N/A 07/03/2016   Procedure: SPINAL CORD STIMULATOR BATTERY PLACMENT;  Surgeon: Melina Schools, MD;  Location: [facility];  Service: Orthopedics;  Laterality: N/A;   TONSILLECTOMY  AGE 44   TOTAL KNEE ARTHROPLASTY Right 05/06/2000   OA RIGHT KNEE   VAGINAL HYSTERECTOMY  1970's    FAMILY HISTORY Family History  Problem Relation Age of Onset   Cancer Mother        breast   Heart Problems Mother    Prostate cancer Father    Muscular dystrophy Son    Lung cancer Son    Cancer Maternal Grandmother        breast   Heart disease Maternal Grandfather    Breast cancer Sister     SOCIAL HISTORY Social History   Tobacco Use   Smoking status: Never   Smokeless tobacco: Never  Vaping Use   Vaping Use: Never used  Substance Use Topics   Alcohol use: No   Drug use: No         OPHTHALMIC EXAM:  Base Eye Exam     Visual Acuity (ETDRS)       Right Left   Dist [place] 20/40 20/30 -1   Dist ph [place] NI          Tonometry (Tonopen, 9:51 AM)       Right Left   Pressure 9 11         Pupils       Pupils Dark Light APD   Right PERRL 3 2 None   Left PERRL 3 2 None         Extraocular Movement       Right Left    Full Full         Neuro/Psych     Oriented x3: Yes   Mood/Affect: Normal         Dilation     Left eye: 1.0% Mydriacyl, 2.5% Phenylephrine @  9:51 AM           Slit Lamp and Fundus Exam     External Exam       Right Left   External Normal Normal         Slit Lamp Exam       Right Left   Lids/Lashes Normal Normal   Conjunctiva/Sclera White and quiet White and quiet   Cornea Clear Clear   Anterior Chamber Deep and quiet Deep and quiet   Iris Round and reactive Round and reactive   Lens Posterior chamber intraocular lens  Posterior chamber intraocular lens   Anterior Vitreous Normal Normal         Fundus Exam       Right Left   Posterior Vitreous  Normal   Disc  Normal   C/D Ratio  0.3   Macula  Microaneurysms, Macular thickening, Moderate clinically significant macular edema, superotemporal to FAZ   Vessels  NPDR-Severe   Periphery  Normal            IMAGING AND PROCEDURES  Imaging and Procedures for 12/17/21  Intravitreal Injection, Pharmacologic Agent - OS - Left Eye       Time Out 12/17/2021. 10:16 AM. Confirmed correct patient, procedure, site, and patient consented.   Anesthesia Topical anesthesia was used. Anesthetic medications included Lidocaine 4%.   Procedure Preparation included Tobramycin 0.3%, Ofloxacin , 5% betadine to ocular surface, 10% betadine to eyelids. A 30 gauge needle was used.   Injection: 2.5 mg bevacizumab 2.5 MG/0.1ML   Route: Intravitreal, Site: Left Eye   NDC: [phone], Lot: [id number]   Post-op Post injection exam found visual acuity of at least counting fingers. The patient tolerated the procedure well. There were no complications. The patient received written and verbal post procedure care education. Post injection medications included ocuflox.      OCT, Retina - OU - Both Eyes       Right Eye Quality was good. Scan locations included subfoveal. Central Foveal Thickness: 554. Progression has worsened. Findings include abnormal foveal contour.   Left Eye Quality was good. Scan locations included extrafoveal. Central Foveal Thickness: 378. Progression has improved. Findings include abnormal foveal contour.   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 Severe nonproliferative diabetic retinopathy of left eye, with macular edema, associated with type 2 diabetes mellitus ([facility])  T02.4097 Intravitreal Injection, Pharmacologic Agent - OS - Left Eye    OCT, Retina - OU - Both Eyes    bevacizumab (AVASTIN) SOSY 2.5 mg    2. 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  OS center involved CSME continues to improve currently at 7-week follow-up interval, will maintain  follow-up interval thereafter  2.  OD, at 9 weeks post injection Avastin, with worsening of CSME will need injection in the near future after dilated examination completed  3.  Ophthalmic Meds Ordered this visit:  Meds ordered this encounter  Medications   bevacizumab (AVASTIN) SOSY 2.5 mg       Return in about 1 week (around 12/24/2021) for dilate, OD, AVASTIN OCT,,, and 6 weeks dilate OS Avastin OCT.  There are no Patient Instructions on file for this visit.   Explained the diagnoses, plan, and follow up with the patient and they expressed understanding.  Patient expressed understanding of the importance of proper follow up care.   Clent Demark Babacar Haycraft M.D. Diseases & Surgery of the Retina and Vitreous Retina & Diabetic [address] 12/17/21     Abbreviations: M myopia (nearsighted); A astigmatism; H hyperopia (farsighted); P presbyopia; Mrx spectacle prescription;  CTL contact lenses; OD right eye; OS left eye; OU both eyes  XT exotropia; ET esotropia; PEK punctate epithelial keratitis;  PEE punctate epithelial erosions; DES dry eye syndrome; MGD meibomian gland dysfunction; ATs artificial tears; PFAT's preservative free artificial tears; [place] nuclear sclerotic cataract; PSC posterior subcapsular cataract; ERM epi-retinal membrane; PVD posterior vitreous detachment; RD retinal detachment; DM diabetes mellitus; DR diabetic retinopathy; NPDR non-proliferative diabetic retinopathy; PDR proliferative diabetic retinopathy; CSME clinically significant macular edema; DME diabetic macular edema; dbh dot blot hemorrhages; CWS cotton wool spot; POAG primary open angle glaucoma; C/D cup-to-disc ratio; HVF humphrey visual field; GVF goldmann visual field; OCT optical coherence tomography; IOP intraocular pressure; BRVO Branch retinal vein occlusion; CRVO central retinal vein occlusion; CRAO central retinal artery occlusion; BRAO branch retinal artery occlusion; RT retinal tear; SB scleral buckle; PPV pars plana vitrectomy; VH Vitreous hemorrhage; PRP panretinal laser photocoagulation; IVK intravitreal kenalog; VMT vitreomacular traction; MH Macular hole;  NVD neovascularization of the disc; NVE neovascularization elsewhere; AREDS age related eye disease study; ARMD age related macular degeneration; POAG primary open angle glaucoma; EBMD epithelial/anterior basement membrane dystrophy; ACIOL anterior chamber intraocular lens; IOL intraocular lens; PCIOL posterior chamber intraocular lens; Phaco/IOL phacoemulsification with intraocular lens placement; [place] photorefractive keratectomy; LASIK laser assisted in situ keratomileusis; HTN hypertension; DM diabetes mellitus; COPD chronic obstructive pulmonary disease

## 2021-12-24 ENCOUNTER — Encounter (INDEPENDENT_AMBULATORY_CARE_PROVIDER_SITE_OTHER): Payer: Medicare PPO | Admitting: Ophthalmology

## 2021-12-24 ENCOUNTER — Ambulatory Visit (INDEPENDENT_AMBULATORY_CARE_PROVIDER_SITE_OTHER): Payer: Medicare PPO | Admitting: Ophthalmology

## 2021-12-24 ENCOUNTER — Other Ambulatory Visit: Payer: Self-pay

## 2021-12-24 ENCOUNTER — Encounter (INDEPENDENT_AMBULATORY_CARE_PROVIDER_SITE_OTHER): Payer: Self-pay | Admitting: Ophthalmology

## 2021-12-24 DIAGNOSIS — H43823 Vitreomacular adhesion, bilateral: Secondary | ICD-10-CM | POA: Diagnosis not present

## 2021-12-24 DIAGNOSIS — E113412 Type 2 diabetes mellitus with severe nonproliferative diabetic retinopathy with macular edema, left eye: Secondary | ICD-10-CM | POA: Diagnosis not present

## 2021-12-24 DIAGNOSIS — E113511 Type 2 diabetes mellitus with proliferative diabetic retinopathy with macular edema, right eye: Secondary | ICD-10-CM | POA: Diagnosis not present

## 2021-12-24 MED ORDER — BEVACIZUMAB 2.5 MG/0.1ML IZ SOSY
2.5000 mg | PREFILLED_SYRINGE | INTRAVITREAL | Status: AC | PRN
  Administered 2021-12-24: 2.5 mg via INTRAVITREAL

## 2021-12-24 NOTE — Assessment & Plan Note (Signed)
OD overall improved yet still very active sooner involved CSME.  Will need injection Avastin today and shorten interval follow-up in the right eye to 4 to 5 weeks

## 2021-12-24 NOTE — Progress Notes (Signed)
12/24/2021     CHIEF COMPLAINT Patient presents for  Chief Complaint  Patient presents with   Diabetic Retinopathy with Macular Edema      HISTORY OF PRESENT ILLNESS: Sara Graves is a 81 y.o. female who presents to the clinic today for:   HPI   10 weeks since last OD injection, dilate OD, Avastin OD OCT.  For severe NPDR with CSME OD. Patient states vision is stable and unchanged since last visit. Denies any new floaters or FOL.  Last edited by Hurman Horn, MD on 12/24/2021 10:17 AM.      Referring physician: [name], PA-C [place],  [place] [address]  HISTORICAL INFORMATION:   Selected notes from the MEDICAL RECORD NUMBER    Lab Results  Component Value Date   HGBA1C 9.8 (H) 02/12/2021     CURRENT MEDICATIONS: No current outpatient medications on file. (Ophthalmic Drugs)   No current facility-administered medications for this visit. (Ophthalmic Drugs)   Current Outpatient Medications (Other)  Medication Sig   alendronate (FOSAMAX) 70 MG tablet Take 1 tablet (70 mg total) by mouth once a week.   aspirin EC 81 MG tablet Take 81 mg by mouth daily.   busPIRone (BUSPAR) 5 MG tablet Take 5 mg by mouth 2 (two) times daily. As needed   Calcium Carb-Cholecalciferol (CALCIUM 600 + D PO) Take 1 tablet by mouth daily.    dapagliflozin propanediol (FARXIGA) 10 MG TABS tablet Take 1 tablet (10 mg total) by mouth daily.   diclofenac Sodium (VOLTAREN) 1 % GEL APPLY 2 G TOPICALLY FOUR TIMES DAILY.   ezetimibe (ZETIA) 10 MG tablet Take 10 mg by mouth daily.   gabapentin (NEURONTIN) 100 MG capsule Take 100 mg by mouth at bedtime.   glucose blood test strip 1 each 3 (three) times daily.    insulin aspart (NOVOLOG FLEXPEN) 100 UNIT/ML FlexPen Inject into the skin.   insulin degludec (TRESIBA) 100 UNIT/ML FlexTouch Pen Inject into the skin.   isosorbide dinitrate (ISORDIL) 40 MG tablet Take 1 tablet (40 mg total) by mouth 3 (three) times daily.    levothyroxine (SYNTHROID, LEVOTHROID) 50 MCG tablet Take 50 mcg by mouth daily before breakfast.    Magnesium Oxide 500 MG TABS TAKE 1 TABLET (500 MG TOTAL) BY MOUTH IN THE MORNING AND AT BEDTIME. (Patient taking differently: Take 500 mg by mouth in the morning, at noon, and at bedtime.)   methocarbamol (ROBAXIN) 500 MG tablet Take 500 mg by mouth 4 (four) times daily.   metoprolol succinate (TOPROL-XL) 50 MG 24 hr tablet TAKE 1 TABLET BY MOUTH EVERY DAY WITH OR IMMEDIATELY FOLLOWING A MEAL   nitroGLYCERIN (NITROSTAT) 0.4 MG SL tablet PLACE 1 TABLET UNDER THE TONGUE EVERY 5 MINUTES X 3 DOSES AS NEEDED FOR CHEST PAIN. (Patient taking differently: Place 0.4 mg under the tongue every 5 (five) minutes as needed for chest pain.)   ONETOUCH VERIO test strip 1 each by Other route 3 (three) times daily.   rosuvastatin (CRESTOR) 40 MG tablet Take 1 tablet (40 mg total) by mouth at bedtime.   sacubitril-valsartan (ENTRESTO) 97-103 MG Take 1 tablet by mouth 2 (two) times daily.   torsemide (DEMADEX) 20 MG tablet Take 20 mg by mouth every other day.   vitamin B-12 (CYANOCOBALAMIN) 1000 MCG tablet Take 1,000 mcg by mouth daily.    Vitamin D, Ergocalciferol, (DRISDOL) 50000 units CAPS capsule Take 50,000 Units by mouth every 7 (seven) days. Take on  Thursdays   No current facility-administered medications for this visit. (Other)      REVIEW OF SYSTEMS:    ALLERGIES Allergies  Allergen Reactions   Rosiglitazone Maleate Anaphylaxis and Swelling   Morphine Other (See Comments)    SEVERE HYPOTENSION    Tramadol Other (See Comments)    Unknown reaction   Cephalexin Diarrhea   Elavil [Amitriptyline] Nausea Only   Sulfa Antibiotics Diarrhea and Nausea And Vomiting   Tramadol-Acetaminophen Rash    PAST MEDICAL HISTORY Past Medical History:  Diagnosis Date   Acute combined systolic and diastolic heart failure ([facility]) 04/25/2018   Anxiety    Arthritis    "hands" (12/29/2017)   Basal cell carcinoma of  nose    removed   Bursitis of left shoulder    BV ICD Medtronic Claria MRI CRTD 06/21/2019   Cat scratch fever    Late 90s   Chronic combined systolic and diastolic CHF (congestive heart failure) ([facility]) 06/08/2019   Coronary artery disease    Depression    Diabetes mellitus, type II, insulin dependent ([facility])    Encounter for assessment of implantable cardioverter-defibrillator (ICD) 09/28/2019   GERD (gastroesophageal reflux disease)    H. pylori infection 2008 and 1998   treated   Hyperlipidemia    Hypertension    Hypothyroidism    Low oxygen saturation    Macular degeneration    Multinodular goiter    Rotator cuff tear, left recurrent    Urge urinary incontinence    UTI (lower urinary tract infection) 05/2016   Past Surgical History:  Procedure Laterality Date   APPENDECTOMY  AGE 75   BACK SURGERY     BASAL CELL CARCINOMA EXCISION     "nose"   BIV ICD INSERTION CRT-D N/A 06/21/2019   Procedure: BIV ICD INSERTION CRT-D;  Surgeon: Evans Lance, MD;  Location: [facility];  Service: Cardiovascular;  Laterality: N/A;   BLADDER NECK SUSPENSION  1970's   BUNIONECTOMY WITH HAMMERTOE RECONSTRUCTION Bilateral    CARPAL TUNNEL RELEASE Right 05/2017   COLONOSCOPY W/ POLYPECTOMY     CORONARY ANGIOPLASTY WITH STENT PLACEMENT  12/29/2017   CORONARY BALLOON ANGIOPLASTY N/A 02/08/2019   Procedure: CORONARY BALLOON ANGIOPLASTY;  Surgeon: Adrian Prows, MD;  Location: [facility];  Service: Cardiovascular;  Laterality: N/A;   CORONARY STENT INTERVENTION N/A 12/29/2017   Procedure: CORONARY STENT INTERVENTION;  Surgeon: Nigel Mormon, MD;  Location: [facility];  Service: Cardiovascular;  Laterality: N/A;   CORONARY STENT INTERVENTION N/A 04/26/2018   Procedure: CORONARY STENT INTERVENTION;  Surgeon: Nigel Mormon, MD;  Location: [facility];  Service: Cardiovascular;  Laterality: N/A;   CORONARY STENT INTERVENTION N/A 02/08/2019   Procedure: CORONARY  STENT INTERVENTION;  Surgeon: Adrian Prows, MD;  Location: [facility];  Service: Cardiovascular;  Laterality: N/A;   ESOPHAGOGASTRODUODENOSCOPY ENDOSCOPY     FORAMINAL DECOMPRESSION AT L2 TO THE SACRUM  01-05-2008   L2  -  S1   GANGLION CYST EXCISION Left 01/17/2009   ring finger   IMPLANTATION PERMANENT SPINAL CORD STIMULATOR  06-15-2008   JUNE 2013--  BATTERY CHANGE   JOINT REPLACEMENT     LEFT HEART CATH AND CORONARY ANGIOGRAPHY N/A 04/26/2018   Procedure: LEFT HEART CATH AND CORONARY ANGIOGRAPHY;  Surgeon: Nigel Mormon, MD;  Location: [facility];  Service: Cardiovascular;  Laterality: N/A;   LEFT HEART CATH AND CORONARY ANGIOGRAPHY N/A 02/08/2019   Procedure: LEFT HEART CATH  AND CORONARY ANGIOGRAPHY;  Surgeon: Adrian Prows, MD;  Location: [facility];  Service: Cardiovascular;  Laterality: N/A;   LIPOMA EXCISION Right    RIGHT ELBOW   METATARSAL HEAD EXCISION Right 06/16/2018   Procedure: right 5th metatarsal excision, a mini c-arm;  Surgeon: Trula Slade, DPM;  [facility];  Service: Podiatry;  Laterality: Right;  anesthesia can do block   RIGHT/LEFT HEART CATH AND CORONARY ANGIOGRAPHY N/A 12/29/2017   Procedure: RIGHT/LEFT HEART CATH AND CORONARY ANGIOGRAPHY;  Surgeon: Nigel Mormon, MD;  Location: [facility];  Service: Cardiovascular;  Laterality: N/A;   SHOULDER OPEN ROTATOR CUFF REPAIR  09/07/2012   Procedure: ROTATOR CUFF REPAIR SHOULDER OPEN;  Surgeon: Magnus Sinning, MD;  Location: [facility];  Service: Orthopedics;  Laterality: Left;  OPEN ANTERIOR ACROMIONECTOMY AND ROTATOR CUFF REPAIR ON LEFT    SHOULDER OPEN ROTATOR CUFF REPAIR Left 12/28/2012   Procedure: ROTATOR CUFF REPAIR SHOULDER OPEN;  Surgeon: Magnus Sinning, MD;  Location: [facility];  Service: Orthopedics;  Laterality: Left;  OPEN SHOULDER ROTATOR CUFF REPAIR ON LEFT  WITH ANTERIOR ACROMINECTOMY    SHOULDER OPEN ROTATOR CUFF REPAIR  Right 03/28/2003   RIGHT SHOULDER  DEGENERATIVE AC JOINT AND RC TEAR   SPINAL CORD STIMULATOR BATTERY EXCHANGE N/A 07/03/2016   Procedure: SPINAL CORD STIMULATOR BATTERY PLACMENT;  Surgeon: Melina Schools, MD;  Location: [facility];  Service: Orthopedics;  Laterality: N/A;   TONSILLECTOMY  AGE 79   TOTAL KNEE ARTHROPLASTY Right 05/06/2000   OA RIGHT KNEE   VAGINAL HYSTERECTOMY  1970's    FAMILY HISTORY Family History  Problem Relation Age of Onset   Cancer Mother        breast   Heart Problems Mother    Prostate cancer Father    Muscular dystrophy Son    Lung cancer Son    Cancer Maternal Grandmother        breast   Heart disease Maternal Grandfather    Breast cancer Sister     SOCIAL HISTORY Social History   Tobacco Use   Smoking status: Never   Smokeless tobacco: Never  Vaping Use   Vaping Use: Never used  Substance Use Topics   Alcohol use: No   Drug use: No         OPHTHALMIC EXAM:  Base Eye Exam     Visual Acuity (ETDRS)       Right Left   Dist [place] 20/30 -2 20/30 -1         Tonometry (Tonopen, 9:57 AM)       Right Left   Pressure 13 12         Pupils       Pupils Dark Light APD   Right PERRL 3 2 None   Left PERRL 3 2 None         Extraocular Movement       Right Left    Full Full         Neuro/Psych     Oriented x3: Yes   Mood/Affect: Normal         Dilation     Both eyes: 1.0% Mydriacyl, 2.5% Phenylephrine @ 9:57 AM           Slit Lamp and Fundus Exam     External Exam       Right Left   External Normal Normal         Slit Lamp Exam  Right Left   Lids/Lashes Normal Normal   Conjunctiva/Sclera White and quiet White and quiet   Cornea Clear Clear   Anterior Chamber Deep and quiet Deep and quiet   Iris Round and reactive Round and reactive   Lens Posterior chamber intraocular lens Posterior chamber intraocular lens   Anterior Vitreous Normal Normal         Fundus Exam       Right Left   Posterior  Vitreous Normal Normal   Disc Normal Normal   C/D Ratio 0.3 0.3   Macula Microaneurysms, Macular thickening, Moderate clinically significant macular edema, center involved Microaneurysms, Macular thickening, Moderate clinically significant macular edema, superotemporal to FAZ   Vessels NPDR-Severe, with NVE INF NASAL TO NERVE NPDR-Severe   Periphery Normal Normal            IMAGING AND PROCEDURES  Imaging and Procedures for 12/24/21  Intravitreal Injection, Pharmacologic Agent - OD - Right Eye       Time Out 12/24/2021. 10:20 AM. Confirmed correct patient, procedure, site, and patient consented.   Anesthesia Topical anesthesia was used. Anesthetic medications included Lidocaine 4%.   Procedure Preparation included Tobramycin 0.3%, 10% betadine to eyelids, 5% betadine to ocular surface. A 30 gauge needle was used.   Injection: 2.5 mg bevacizumab 2.5 MG/0.1ML   Route: Intravitreal, Site: Right Eye   NDC: [phone], Lot: [id number]   Post-op Post injection exam found visual acuity of at least counting fingers. The patient tolerated the procedure well. There were no complications. The patient received written and verbal post procedure care education. Post injection medications included ocuflox.      OCT, Retina - OU - Both Eyes       Right Eye Quality was good. Scan locations included subfoveal. Central Foveal Thickness: 558. Progression has worsened. Findings include abnormal foveal contour.   Left Eye Quality was good. Scan locations included extrafoveal. Central Foveal Thickness: 319. Progression has improved. Findings include abnormal foveal contour.   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  Will need injection Avastin today and shorten interval follow-up in the right eye to 4 to 5 weeks  Severe nonproliferative diabetic retinopathy of left eye, with macular edema, associated with type 2 diabetes mellitus ([facility]) OS today at 1 week post most recent injection with significant improvement in visual functioning as well as CSME  Vitreomacular adhesion of both eyes Physiologic OU stable     ICD-10-CM   1. 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 Severe nonproliferative diabetic retinopathy of left eye, with macular edema, associated with type 2 diabetes mellitus ([facility])  Q75.9163     3. Vitreomacular adhesion of both eyes  H43.823       1.  OD, sooner involved CSME still very active, currently at 10-week  follow-up interval in the right eye.  We will repeat injection today and shorten interval follow-up to 5 weeks  2.  OS today better at 1 week post recent injection.  3.  Late OU next likely fluorescein angiography repeated right and left eye in order to establish macular perfusion and potential for extensive retinal nonperfusion adding to vegF burden  Ophthalmic Meds Ordered this visit:  Meds ordered this encounter  Medications   bevacizumab (AVASTIN) SOSY 2.5 mg       Return in about 5 weeks (around 01/28/2022) for DILATE OU, OPTOS FFA R/L, COLOR FP, AVASTIN OCT, OD.  There are no Patient Instructions on file for this visit.   Explained the diagnoses, plan, and follow up with the patient and they expressed understanding.  Patient expressed understanding of the  importance of proper follow up care.   Clent Demark Orrin Yurkovich M.D. Diseases & Surgery of the Retina and Vitreous Retina & Diabetic [facility] 12/24/21     Abbreviations: M myopia (nearsighted); A astigmatism; H hyperopia (farsighted); P presbyopia; Mrx spectacle prescription;  CTL contact lenses; OD right eye; OS left eye; OU both eyes  XT exotropia; ET esotropia; PEK punctate epithelial keratitis; PEE punctate epithelial erosions; DES dry eye syndrome; MGD meibomian gland dysfunction; ATs artificial tears; PFAT's preservative free artificial tears; [place] nuclear sclerotic cataract; PSC posterior subcapsular cataract; ERM epi-retinal membrane; PVD posterior vitreous detachment; RD retinal detachment; DM diabetes mellitus; DR diabetic retinopathy; NPDR non-proliferative diabetic retinopathy; PDR proliferative diabetic retinopathy; CSME clinically significant macular edema; DME diabetic macular edema; dbh dot blot hemorrhages; CWS cotton wool spot; POAG primary open angle glaucoma; C/D cup-to-disc ratio; HVF humphrey visual field; GVF goldmann visual field; OCT optical coherence tomography; IOP intraocular pressure; BRVO Branch retinal vein occlusion; CRVO central retinal vein occlusion; CRAO central retinal artery occlusion; BRAO branch retinal artery occlusion; RT retinal tear; SB scleral buckle; PPV pars plana vitrectomy; VH Vitreous hemorrhage; PRP panretinal laser photocoagulation; IVK intravitreal kenalog; VMT vitreomacular traction; MH Macular hole;  NVD neovascularization of the disc; NVE neovascularization elsewhere; AREDS age related eye disease study; ARMD age related macular degeneration; POAG primary open angle glaucoma; EBMD epithelial/anterior basement membrane dystrophy; ACIOL anterior chamber intraocular lens; IOL intraocular lens; PCIOL posterior chamber intraocular lens; Phaco/IOL phacoemulsification with intraocular lens placement; [place] photorefractive keratectomy; LASIK laser assisted in situ  keratomileusis; HTN hypertension; DM diabetes mellitus; COPD chronic obstructive pulmonary disease

## 2021-12-24 NOTE — Assessment & Plan Note (Signed)
Physiologic OU stable 

## 2021-12-24 NOTE — Assessment & Plan Note (Signed)
OS today at 1 week post most recent injection with significant improvement in visual functioning as well as CSME

## 2022-01-22 ENCOUNTER — Institutional Professional Consult (permissible substitution): Payer: Medicare PPO | Admitting: Internal Medicine

## 2022-01-28 ENCOUNTER — Ambulatory Visit (INDEPENDENT_AMBULATORY_CARE_PROVIDER_SITE_OTHER): Payer: Medicare PPO | Admitting: Ophthalmology

## 2022-01-28 ENCOUNTER — Other Ambulatory Visit: Payer: Self-pay

## 2022-01-28 ENCOUNTER — Encounter (INDEPENDENT_AMBULATORY_CARE_PROVIDER_SITE_OTHER): Payer: Self-pay | Admitting: Ophthalmology

## 2022-01-28 DIAGNOSIS — H43823 Vitreomacular adhesion, bilateral: Secondary | ICD-10-CM

## 2022-01-28 DIAGNOSIS — E113412 Type 2 diabetes mellitus with severe nonproliferative diabetic retinopathy with macular edema, left eye: Secondary | ICD-10-CM | POA: Diagnosis not present

## 2022-01-28 DIAGNOSIS — E113511 Type 2 diabetes mellitus with proliferative diabetic retinopathy with macular edema, right eye: Secondary | ICD-10-CM | POA: Diagnosis not present

## 2022-01-28 MED ORDER — BEVACIZUMAB 2.5 MG/0.1ML IZ SOSY
2.5000 mg | PREFILLED_SYRINGE | INTRAVITREAL | Status: AC | PRN
  Administered 2022-01-28: 2.5 mg via INTRAVITREAL

## 2022-01-28 NOTE — Assessment & Plan Note (Signed)
Much improved CSME at shorter interval follow-up OD of 5 weeks, repeat injection Avastin today ?

## 2022-01-28 NOTE — Assessment & Plan Note (Signed)
Physiologic OU observe 

## 2022-01-28 NOTE — Assessment & Plan Note (Signed)
Today at 6 weeks postinjection, Avastin, improved overall yet still active CSME ?

## 2022-01-28 NOTE — Progress Notes (Signed)
? ? ?01/28/2022 ? ?  ? ?CHIEF COMPLAINT ?Patient presents for  ?Chief Complaint  ?Patient presents with  ? Diabetic Retinopathy with Macular Edema  ? ? ? ? ?HISTORY OF PRESENT ILLNESS: ?Sara Graves is a 81 y.o. female who presents to the clinic today for:  ? ?HPI   ?5 weeks dilate OS, Avastin OCT. ?Patient states vision is stable and unchanged since last visit. Denies any new floaters or FOL. ?Patient asks "why do I come back tomorrow for a procedure and what is the procedure?"  Answer, was scheduled for fluorescein angiography and potential Avastin injection fellow eye, will proceed with injection right eye today as well no FFA ?Last edited by Hurman Horn, MD on 01/28/2022 10:44 AM.  ?  ? ? ?Referring physician: ?Debbra Riding, MD ?[address] ?[address] ?[place],  [place] [address] ? ?HISTORICAL INFORMATION:  ? ?Selected notes from the [facility] ?  ? ?Lab Results  ?Component Value Date  ? HGBA1C 9.8 (H) 02/12/2021  ?  ? ?CURRENT MEDICATIONS: ?No current outpatient medications on file. (Ophthalmic Drugs)  ? ?No current facility-administered medications for this visit. (Ophthalmic Drugs)  ? ?Current Outpatient Medications (Other)  ?Medication Sig  ? alendronate (FOSAMAX) 70 MG tablet Take 1 tablet (70 mg total) by mouth once a week.  ? aspirin EC 81 MG tablet Take 81 mg by mouth daily.  ? busPIRone (BUSPAR) 5 MG tablet Take 5 mg by mouth 2 (two) times daily. As needed  ? Calcium Carb-Cholecalciferol (CALCIUM 600 + D PO) Take 1 tablet by mouth daily.   ? dapagliflozin propanediol (FARXIGA) 10 MG TABS tablet Take 1 tablet (10 mg total) by mouth daily.  ? diclofenac Sodium (VOLTAREN) 1 % GEL APPLY 2 G TOPICALLY FOUR TIMES DAILY.  ? ezetimibe (ZETIA) 10 MG tablet Take 10 mg by mouth daily.  ? gabapentin (NEURONTIN) 100 MG capsule Take 100 mg by mouth at bedtime.  ? glucose blood test strip 1 each 3 (three) times daily.   ? insulin aspart (NOVOLOG FLEXPEN) 100 UNIT/ML FlexPen Inject into the skin.  ? insulin  degludec (TRESIBA) 100 UNIT/ML FlexTouch Pen Inject into the skin.  ? isosorbide dinitrate (ISORDIL) 40 MG tablet Take 1 tablet (40 mg total) by mouth 3 (three) times daily.  ? levothyroxine (SYNTHROID, LEVOTHROID) 50 MCG tablet Take 50 mcg by mouth daily before breakfast.   ? Magnesium Oxide 500 MG TABS TAKE 1 TABLET (500 MG TOTAL) BY MOUTH IN THE MORNING AND AT BEDTIME. (Patient taking differently: Take 500 mg by mouth in the morning, at noon, and at bedtime.)  ? methocarbamol (ROBAXIN) 500 MG tablet Take 500 mg by mouth 4 (four) times daily.  ? metoprolol succinate (TOPROL-XL) 50 MG 24 hr tablet TAKE 1 TABLET BY MOUTH EVERY DAY WITH OR IMMEDIATELY FOLLOWING A MEAL  ? nitroGLYCERIN (NITROSTAT) 0.4 MG SL tablet PLACE 1 TABLET UNDER THE TONGUE EVERY 5 MINUTES X 3 DOSES AS NEEDED FOR CHEST PAIN. (Patient taking differently: Place 0.4 mg under the tongue every 5 (five) minutes as needed for chest pain.)  ? ONETOUCH VERIO test strip 1 each by Other route 3 (three) times daily.  ? rosuvastatin (CRESTOR) 40 MG tablet Take 1 tablet (40 mg total) by mouth at bedtime.  ? sacubitril-valsartan (ENTRESTO) 97-103 MG Take 1 tablet by mouth 2 (two) times daily.  ? torsemide (DEMADEX) 20 MG tablet Take 20 mg by mouth every other day.  ? vitamin B-12 (CYANOCOBALAMIN) 1000 MCG  tablet Take 1,000 mcg by mouth daily.   ? Vitamin D, Ergocalciferol, (DRISDOL) 50000 units CAPS capsule Take 50,000 Units by mouth every 7 (seven) days. Take on Thursdays  ? ?No current facility-administered medications for this visit. (Other)  ? ? ? ? ?REVIEW OF SYSTEMS: ?ROS   ?Negative for: Constitutional, Gastrointestinal, Neurological, Skin, Genitourinary, Musculoskeletal, HENT, Endocrine, Cardiovascular, Eyes, Respiratory, Psychiatric, Allergic/Imm, Heme/Lymph ?Last edited by Hurman Horn, MD on 01/28/2022 10:40 AM.  ?  ? ? ? ?ALLERGIES ?Allergies  ?Allergen Reactions  ? Rosiglitazone Maleate Anaphylaxis and Swelling  ? Morphine Other (See Comments)  ?   SEVERE HYPOTENSION ?  ? Tramadol Other (See Comments)  ?  Unknown reaction  ? Cephalexin Diarrhea  ? Elavil [Amitriptyline] Nausea Only  ? Sulfa Antibiotics Diarrhea and Nausea And Vomiting  ? Tramadol-Acetaminophen Rash  ? ? ?PAST MEDICAL HISTORY ?Past Medical History:  ?Diagnosis Date  ? Acute combined systolic and diastolic heart failure ([facility]) 04/25/2018  ? Anxiety   ? Arthritis   ? "hands" (12/29/2017)  ? Basal cell carcinoma of nose   ? removed  ? Bursitis of left shoulder   ? BV ICD Medtronic Claria MRI CRTD 06/21/2019  ? Cat scratch fever   ? Late 90s  ? Chronic combined systolic and diastolic CHF (congestive heart failure) ([facility]) 06/08/2019  ? Coronary artery disease   ? Depression   ? Diabetes mellitus, type II, insulin dependent ([facility])   ? Encounter for assessment of implantable cardioverter-defibrillator (ICD) 09/28/2019  ? GERD (gastroesophageal reflux disease)   ? H. pylori infection 2008 and 1998  ? treated  ? Hyperlipidemia   ? Hypertension   ? Hypothyroidism   ? Low oxygen saturation   ? Macular degeneration   ? Multinodular goiter   ? Rotator cuff tear, left recurrent   ? Urge urinary incontinence   ? UTI (lower urinary tract infection) 05/2016  ? ?Past Surgical History:  ?Procedure Laterality Date  ? APPENDECTOMY  AGE 52  ? BACK SURGERY    ? BASAL CELL CARCINOMA EXCISION    ? "nose"  ? BIV ICD INSERTION CRT-D N/A 06/21/2019  ? Procedure: BIV ICD INSERTION CRT-D;  Surgeon: Evans Lance, MD;  Location: [facility];  Service: Cardiovascular;  Laterality: N/A;  ? BLADDER NECK SUSPENSION  1970's  ? BUNIONECTOMY WITH HAMMERTOE RECONSTRUCTION Bilateral   ? CARPAL TUNNEL RELEASE Right 05/2017  ? COLONOSCOPY W/ POLYPECTOMY    ? CORONARY ANGIOPLASTY WITH STENT PLACEMENT  12/29/2017  ? CORONARY BALLOON ANGIOPLASTY N/A 02/08/2019  ? Procedure: CORONARY BALLOON ANGIOPLASTY;  Surgeon: Adrian Prows, MD;  Location: [facility];  Service: Cardiovascular;  Laterality: N/A;  ? CORONARY STENT INTERVENTION N/A  12/29/2017  ? Procedure: CORONARY STENT INTERVENTION;  Surgeon: Nigel Mormon, MD;  Location: [facility];  Service: Cardiovascular;  Laterality: N/A;  ? CORONARY STENT INTERVENTION N/A 04/26/2018  ? Procedure: CORONARY STENT INTERVENTION;  Surgeon: Nigel Mormon, MD;  Location: [facility];  Service: Cardiovascular;  Laterality: N/A;  ? CORONARY STENT INTERVENTION N/A 02/08/2019  ? Procedure: CORONARY STENT INTERVENTION;  Surgeon: Adrian Prows, MD;  Location: [facility];  Service: Cardiovascular;  Laterality: N/A;  ? ESOPHAGOGASTRODUODENOSCOPY ENDOSCOPY    ? FORAMINAL DECOMPRESSION AT L2 TO THE SACRUM  01-05-2008  ? L2  -  S1  ? GANGLION CYST EXCISION Left 01/17/2009  ? ring finger  ? IMPLANTATION PERMANENT SPINAL CORD STIMULATOR  06-15-2008  ? JUNE 2013--  BATTERY CHANGE  ? JOINT REPLACEMENT    ? LEFT HEART CATH AND CORONARY ANGIOGRAPHY N/A 04/26/2018  ? Procedure: LEFT HEART CATH AND CORONARY ANGIOGRAPHY;  Surgeon: Nigel Mormon, MD;  Location: [facility];  Service: Cardiovascular;  Laterality: N/A;  ? LEFT HEART CATH AND CORONARY ANGIOGRAPHY N/A 02/08/2019  ? Procedure: LEFT HEART CATH AND CORONARY ANGIOGRAPHY;  Surgeon: Adrian Prows, MD;  Location: [facility];  Service: Cardiovascular;  Laterality: N/A;  ? LIPOMA EXCISION Right   ? RIGHT ELBOW  ? METATARSAL HEAD EXCISION Right 06/16/2018  ? Procedure: right 5th metatarsal excision, a mini c-arm;  Surgeon: Trula Slade, DPM;  Location[facility];  Service: Podiatry;  Laterality: Right;  anesthesia can do block  ? RIGHT/LEFT HEART CATH AND CORONARY ANGIOGRAPHY N/A 12/29/2017  ? Procedure: RIGHT/LEFT HEART CATH AND CORONARY ANGIOGRAPHY;  Surgeon: Nigel Mormon, MD;  Location: [facility];  Service: Cardiovascular;  Laterality: N/A;  ? SHOULDER OPEN ROTATOR CUFF REPAIR  09/07/2012  ? Procedure: ROTATOR CUFF REPAIR SHOULDER OPEN;  Surgeon: Magnus Sinning, MD;  Location: [facility];   Service: Orthopedics;  Laterality: Left;  OPEN ANTERIOR ACROMIONECTOMY AND ROTATOR CUFF REPAIR ON LEFT   ? SHOULDER OPEN ROTATOR CUFF REPAIR Left 12/28/2012  ? Procedure: ROTATOR CUFF REPAIR SHOULDER

## 2022-01-29 ENCOUNTER — Encounter (INDEPENDENT_AMBULATORY_CARE_PROVIDER_SITE_OTHER): Payer: Self-pay

## 2022-01-29 ENCOUNTER — Encounter (INDEPENDENT_AMBULATORY_CARE_PROVIDER_SITE_OTHER): Payer: Medicare PPO | Admitting: Ophthalmology

## 2022-02-05 NOTE — Progress Notes (Signed)
? ?Synopsis: Referred for pleural effusion by [name], PA-C ? ?Subjective:  ? ?[name] GENDER: female DOB: 03/28/1941, MRN: [id number] ? ?Chief Complaint  ?Patient presents with  ? Pulmonary Consult  ?  Referred by Roe Coombs, PA for eval of pleural effusion. Pt states that he had PNA in Nov 2022. Her breathing is overall doing well.   ? ?80yF with history of CHF due to ICM, goiter, never smoker ? ?She says she had pneumonia in November. She says her PCP said there was a 'spot' on her lungs that 'keeps showing up' on her x-ray. She took an antibiotic, did feel better afterward.  ? ?She says she's overall feeling pretty good. She thinks her back pain limits her physical activity more than any DOE. She has no orthopnea. She even sleeps ok when she sleeps on her left side. She has only a little cough, runny nose that she's blaming on allergy. No fever recently. No drenching night sweats, unintentional weight loss.  ? ?Does not appear that she has had thoracentesis. ? ?Otherwise pertinent review of systems is negative. ? ?No family history of lung cancer, lung disease ? 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 Never smoked.  ? ?Past Medical History:  ?Diagnosis Date  ? Acute combined systolic and diastolic heart failure ([place]) 04/25/2018  ? Anxiety   ? Arthritis   ? "hands" (12/29/2017)  ? Basal cell carcinoma of nose   ? removed  ? Bursitis of left shoulder   ? BV ICD Medtronic Claria MRI CRTD 06/21/2019  ? Cat scratch fever   ? Late 90s  ? Chronic combined systolic and diastolic CHF (congestive heart failure) ([place]) 06/08/2019  ? Coronary artery disease   ? Depression   ? Diabetes mellitus, type II, insulin dependent ([place])   ? Encounter for assessment of implantable cardioverter-defibrillator (ICD) 09/28/2019  ? GERD (gastroesophageal reflux disease)   ? H. pylori infection 2008 and 1998  ? treated  ? Hyperlipidemia   ? Hypertension   ? Hypothyroidism   ?  Low oxygen saturation   ? Macular degeneration   ? Multinodular goiter   ? Rotator cuff tear, left recurrent   ? Urge urinary incontinence   ? UTI (lower urinary tract infection) 05/2016  ?  ? ?Family History  ?Problem Relation Age of Onset  ? Cancer Mother   ?     breast  ? Heart Problems Mother   ? Prostate cancer Father   ? Muscular dystrophy Son   ? Lung cancer Son   ? Cancer Maternal Grandmother   ?     breast  ? Heart disease Maternal Grandfather   ? Breast cancer Sister   ?  ? ?Past Surgical History:  ?Procedure Laterality Date  ? APPENDECTOMY  AGE 13  ? BACK SURGERY    ? BASAL CELL CARCINOMA EXCISION    ? "nose"  ? BIV ICD INSERTION CRT-D N/A 06/21/2019  ? Procedure: BIV ICD INSERTION CRT-D;  Surgeon: Evans Lance, MD;  Location: [facility];  Service: Cardiovascular;  Laterality: N/A;  ? BLADDER NECK SUSPENSION  1970's  ? BUNIONECTOMY WITH HAMMERTOE RECONSTRUCTION Bilateral   ? CARPAL TUNNEL RELEASE Right 05/2017  ? COLONOSCOPY W/ POLYPECTOMY    ? CORONARY ANGIOPLASTY WITH STENT PLACEMENT  12/29/2017  ? CORONARY BALLOON ANGIOPLASTY N/A 02/08/2019  ? Procedure: CORONARY BALLOON ANGIOPLASTY;  Surgeon: Adrian Prows, MD;  Location: [facility];  Service: Cardiovascular;  Laterality: N/A;  ? CORONARY STENT INTERVENTION N/A 12/29/2017  ? Procedure: CORONARY STENT INTERVENTION;  Surgeon: Nigel Mormon, MD;  Location: [facility];  Service: Cardiovascular;  Laterality: N/A;  ? CORONARY STENT INTERVENTION N/A 04/26/2018  ? Procedure: CORONARY STENT INTERVENTION;  Surgeon: Nigel Mormon, MD;  Location: [facility];  Service: Cardiovascular;  Laterality: N/A;  ? CORONARY STENT INTERVENTION N/A 02/08/2019  ? Procedure: CORONARY STENT INTERVENTION;  Surgeon: Adrian Prows, MD;  Location: [facility];  Service: Cardiovascular;  Laterality: N/A;  ? ESOPHAGOGASTRODUODENOSCOPY ENDOSCOPY    ? FORAMINAL DECOMPRESSION AT L2 TO THE SACRUM  01-05-2008  ? L2  -  S1  ? GANGLION CYST  EXCISION Left 01/17/2009  ? ring finger  ? IMPLANTATION PERMANENT SPINAL CORD STIMULATOR  06-15-2008  ? JUNE 2013--  BATTERY CHANGE  ? JOINT REPLACEMENT    ? LEFT HEART CATH AND CORONARY ANGIOGRAPHY N/A 04/26/2018  ? Procedure: LEFT HEART CATH AND CORONARY ANGIOGRAPHY;  Surgeon: Nigel Mormon, MD;  Location: [facility];  Service: Cardiovascular;  Laterality: N/A;  ? LEFT HEART CATH AND CORONARY ANGIOGRAPHY N/A 02/08/2019  ? Procedure: LEFT HEART CATH AND CORONARY ANGIOGRAPHY;  Surgeon: Adrian Prows, MD;  Location: [facility];  Service: Cardiovascular;  Laterality: N/A;  ? LIPOMA EXCISION Right   ? RIGHT ELBOW  ? METATARSAL HEAD EXCISION Right 06/16/2018  ? Procedure: right 5th metatarsal excision, a mini c-arm;  Surgeon: Trula Slade, DPM;  Location: [facility];  Service: Podiatry;  Laterality: Right;  anesthesia can do block  ? RIGHT/LEFT HEART CATH AND CORONARY ANGIOGRAPHY N/A 12/29/2017  ? Procedure: RIGHT/LEFT HEART CATH AND CORONARY ANGIOGRAPHY;  Surgeon: Nigel Mormon, MD;  Location: [facility];  Service: Cardiovascular;  Laterality: N/A;  ? SHOULDER OPEN ROTATOR CUFF REPAIR  09/07/2012  ? Procedure: ROTATOR CUFF REPAIR SHOULDER OPEN;  Surgeon: Magnus Sinning, MD;  Location: [facility];  Service: Orthopedics;  Laterality: Left;  OPEN ANTERIOR ACROMIONECTOMY AND ROTATOR CUFF REPAIR ON LEFT   ? SHOULDER OPEN ROTATOR CUFF REPAIR Left 12/28/2012  ? Procedure: ROTATOR CUFF REPAIR SHOULDER OPEN;  Surgeon: Magnus Sinning, MD;  Location: [facility];  Service: Orthopedics;  Laterality: Left;  OPEN SHOULDER ROTATOR CUFF REPAIR ON LEFT  WITH ANTERIOR ACROMINECTOMY ?  ? SHOULDER OPEN ROTATOR CUFF REPAIR Right 03/28/2003  ? RIGHT SHOULDER  DEGENERATIVE AC JOINT AND RC TEAR  ? SPINAL CORD STIMULATOR BATTERY EXCHANGE N/A 07/03/2016  ? Procedure: SPINAL CORD STIMULATOR BATTERY PLACMENT;  Surgeon: Melina Schools, MD;  Location: [facility];  Service: Orthopedics;   Laterality: N/A;  ? TONSILLECTOMY  AGE 64  ? TOTAL KNEE ARTHROPLASTY Right 05/06/2000  ? OA RIGHT KNEE  ? VAGINAL HYSTERECTOMY  1970's  ? ? ?Social History  ? ?Socioeconomic History  ? Marital status: Married  ?  Spouse name: Acadia Thammavong  ? Number of children: 2  ? Years of education: 11th  ? Highest education level: Not on file  ?Occupational History  ? Occupation: Retired  ?Tobacco Use  ? Smoking status: Never  ? Smokeless tobacco: Never  ?Vaping Use  ? Vaping Use: Never used  ?Substance and Sexual Activity  ? Alcohol use: No  ? Drug use: No  ? Sexual activity: Not Currently  ?Other Topics Concern  ? Not on file  ?Social History Narrative  ? Pt lives with her husband and 1 grown  son in [place]. Younger son has muscular dystrophy. Having a difficult time as primary caregiver to son who is not doing well.  Declined respite care/placement.  ? Caffeine Use: none; very little  ? ?Social Determinants of Health  ? ?Financial Resource Strain: Low Risk   ? Difficulty of Paying Living Expenses: Not very hard  ?Food Insecurity: No Food Insecurity  ? Worried About Charity fundraiser in the Last Year: Never true  ? Ran Out of Food in the Last Year: Never true  ?Transportation Needs: No Transportation Needs  ? Lack of Transportation (Medical): No  ? Lack of Transportation (Non-Medical): No  ?Physical Activity: Not on file  ?Stress: Not on file  ?Social Connections: Socially Integrated  ? Frequency of Communication with Friends and Family: Three times a week  ? Frequency of Social Gatherings with Friends and Family: Twice a week  ? Attends Religious Services: 1 to 4 times per year  ? Active Member of Clubs or Organizations: Yes  ? Attends Archivist Meetings: More than 4 times per year  ? Marital Status: Married  ?Intimate Partner Violence: Not on file  ?  ? ?Allergies  ?Allergen Reactions  ? Rosiglitazone Maleate Anaphylaxis and Swelling  ? Morphine Other (See Comments)  ?  SEVERE HYPOTENSION ?  ? Tramadol Other  (See Comments)  ?  Unknown reaction  ? Cephalexin Diarrhea  ? Elavil [Amitriptyline] Nausea Only  ? Sulfa Antibiotics Diarrhea and Nausea And Vomiting  ? Tramadol-Acetaminophen Rash  ?  ? ?Outpatient Medication

## 2022-02-06 ENCOUNTER — Ambulatory Visit: Payer: Medicare PPO | Admitting: Student

## 2022-02-06 ENCOUNTER — Ambulatory Visit (INDEPENDENT_AMBULATORY_CARE_PROVIDER_SITE_OTHER): Payer: Medicare PPO

## 2022-02-06 ENCOUNTER — Encounter: Payer: Self-pay | Admitting: Student

## 2022-02-06 ENCOUNTER — Other Ambulatory Visit: Payer: Self-pay

## 2022-02-06 VITALS — BP 126/74 | HR 50 | Temp 97.9°F | Ht 62.0 in | Wt 208.4 lb

## 2022-02-06 DIAGNOSIS — J90 Pleural effusion, not elsewhere classified: Secondary | ICD-10-CM | POA: Diagnosis not present

## 2022-02-06 NOTE — Patient Instructions (Signed)
-   Chest x ray today, I'll call if there's anything concerning on it ?- Come back and see [place] if there is more fluid around your lungs despite efforts to get rid of it with torsemide ? ?

## 2022-02-20 ENCOUNTER — Ambulatory Visit: Payer: Medicare PPO | Admitting: Cardiology

## 2022-02-20 DIAGNOSIS — Z4502 Encounter for adjustment and management of automatic implantable cardiac defibrillator: Secondary | ICD-10-CM

## 2022-02-20 DIAGNOSIS — Z95810 Presence of automatic (implantable) cardiac defibrillator: Secondary | ICD-10-CM

## 2022-02-20 DIAGNOSIS — I428 Other cardiomyopathies: Secondary | ICD-10-CM

## 2022-02-20 NOTE — Progress Notes (Signed)
Chief Complaint  ?Patient presents with  ? ICD Check  ? ? ?Remote CRT-D transmission 02/04/2022: ?AP 56%, CRT 97%. Longevity 7 years and 3 months. There are brief atrial and ventricular sensing episodes, atrial sensing episode EGM suggests AF, for 75 minutes on 01/12/2022. There was 1 NSVT episode. Thoracic impedance is at baseline and does not suggest volume overload. ? ? ?Encounter for assessment of implantable cardioverter-defibrillator (ICD) ? ?ICD Medtronic Claria MRI CRTD 06/21/2019 ? ?Nonischemic cardiomyopathy ([place]) ? ?Scheduled  In office ICD 02/20/22  ?Single (S)/Dual (D)/BV (M) BV ?Presenting ASBP ?Pacer dependant: No. Underlying ASVS. AP 53%, BP 97.% ?AT/AF burden <0.1%. Longest 42 min (total 58mn). ?HVR 0.   ?Longevity 7 Years/Voltage.  ?Lead measurements: Stable ?Histogram: Low (L)/normal (N)/high (H)  Normal Patient activity Low. ?Thoracic impedance: Baseline and does not suggest fluid overload state ? ?Observations: Normal BV ICD.  Changes: None ? ?

## 2022-03-02 ENCOUNTER — Other Ambulatory Visit: Payer: Self-pay | Admitting: Student

## 2022-03-06 ENCOUNTER — Ambulatory Visit (INDEPENDENT_AMBULATORY_CARE_PROVIDER_SITE_OTHER): Payer: Medicare PPO | Admitting: Ophthalmology

## 2022-03-06 ENCOUNTER — Encounter (INDEPENDENT_AMBULATORY_CARE_PROVIDER_SITE_OTHER): Payer: Self-pay | Admitting: Ophthalmology

## 2022-03-06 ENCOUNTER — Encounter (INDEPENDENT_AMBULATORY_CARE_PROVIDER_SITE_OTHER): Payer: Medicare PPO | Admitting: Ophthalmology

## 2022-03-06 DIAGNOSIS — E113411 Type 2 diabetes mellitus with severe nonproliferative diabetic retinopathy with macular edema, right eye: Secondary | ICD-10-CM

## 2022-03-06 DIAGNOSIS — H43823 Vitreomacular adhesion, bilateral: Secondary | ICD-10-CM | POA: Diagnosis not present

## 2022-03-06 DIAGNOSIS — E113511 Type 2 diabetes mellitus with proliferative diabetic retinopathy with macular edema, right eye: Secondary | ICD-10-CM

## 2022-03-06 DIAGNOSIS — E113412 Type 2 diabetes mellitus with severe nonproliferative diabetic retinopathy with macular edema, left eye: Secondary | ICD-10-CM

## 2022-03-06 MED ORDER — BEVACIZUMAB 2.5 MG/0.1ML IZ SOSY
2.5000 mg | PREFILLED_SYRINGE | INTRAVITREAL | Status: AC | PRN
  Administered 2022-03-06: 2.5 mg via INTRAVITREAL

## 2022-03-06 NOTE — Progress Notes (Signed)
? ? ?03/06/2022 ? ?  ? ?CHIEF COMPLAINT ?Patient presents for  ?Chief Complaint  ?Patient presents with  ? Diabetic Retinopathy with Macular Edema  ? ? ? ? ?HISTORY OF PRESENT ILLNESS: ?Sara Graves is a 81 y.o. female who presents to the clinic today for:  ? ?HPI   ?5 weeks for DILATE OU, possible FFA R/L, COLOR FP, AVASTIN OCT OD, OS. ?Pt denies floaters and FOL. ?Pt states no changes in vision. ? ?Fluoroscein on back order ?Last edited by Hurman Horn, MD on 03/06/2022  4:13 PM.  ?  ? ? ?Referring physician: ?[name], PA-C ?[facility] ?[place],  [place] [id number] ? ?HISTORICAL INFORMATION:  ? ?Selected notes from the [facility] ?  ? ?Lab Results  ?Component Value Date  ? HGBA1C 9.8 (H) 02/12/2021  ?  ? ?CURRENT MEDICATIONS: ?No current outpatient medications on file. (Ophthalmic Drugs)  ? ?No current facility-administered medications for this visit. (Ophthalmic Drugs)  ? ?Current Outpatient Medications (Other)  ?Medication Sig  ? alendronate (FOSAMAX) 70 MG tablet Take 1 tablet (70 mg total) by mouth once a week.  ? aspirin EC 81 MG tablet Take 81 mg by mouth daily.  ? busPIRone (BUSPAR) 5 MG tablet Take 5 mg by mouth 2 (two) times daily. As needed  ? Calcium Carb-Cholecalciferol (CALCIUM 600 + D PO) Take 1 tablet by mouth daily.   ? dapagliflozin propanediol (FARXIGA) 10 MG TABS tablet Take 1 tablet (10 mg total) by mouth daily.  ? ezetimibe (ZETIA) 10 MG tablet Take 10 mg by mouth daily.  ? gabapentin (NEURONTIN) 100 MG capsule Take 100 mg by mouth at bedtime.  ? glucose blood test strip 1 each 3 (three) times daily.   ? insulin aspart (NOVOLOG FLEXPEN) 100 UNIT/ML FlexPen Inject into the skin.  ? insulin degludec (TRESIBA) 100 UNIT/ML FlexTouch Pen Inject into the skin.  ? isosorbide dinitrate (ISORDIL) 40 MG tablet TAKE 1 TABLET BY MOUTH 3 TIMES DAILY.  ? levothyroxine (SYNTHROID, LEVOTHROID) 50 MCG tablet Take 50 mcg by mouth daily before breakfast.   ? Magnesium Oxide 500 MG TABS TAKE 1  TABLET (500 MG TOTAL) BY MOUTH IN THE MORNING AND AT BEDTIME. (Patient taking differently: Take 500 mg by mouth in the morning, at noon, and at bedtime.)  ? methocarbamol (ROBAXIN) 500 MG tablet Take 500 mg by mouth 4 (four) times daily.  ? metoprolol succinate (TOPROL-XL) 50 MG 24 hr tablet TAKE 1 TABLET BY MOUTH EVERY DAY WITH OR IMMEDIATELY FOLLOWING A MEAL  ? nitroGLYCERIN (NITROSTAT) 0.4 MG SL tablet PLACE 1 TABLET UNDER THE TONGUE EVERY 5 MINUTES X 3 DOSES AS NEEDED FOR CHEST PAIN. (Patient taking differently: Place 0.4 mg under the tongue every 5 (five) minutes as needed for chest pain.)  ? ONETOUCH VERIO test strip 1 each by Other route 3 (three) times daily.  ? rosuvastatin (CRESTOR) 40 MG tablet Take 1 tablet (40 mg total) by mouth at bedtime.  ? sacubitril-valsartan (ENTRESTO) 97-103 MG Take 1 tablet by mouth 2 (two) times daily.  ? torsemide (DEMADEX) 20 MG tablet Take 20 mg by mouth every other day.  ? vitamin B-12 (CYANOCOBALAMIN) 1000 MCG tablet Take 1,000 mcg by mouth daily.   ? Vitamin D, Ergocalciferol, (DRISDOL) 50000 units CAPS capsule Take 50,000 Units by mouth every 7 (seven) days. Take on Thursdays  ? ?No current facility-administered medications for this visit. (Other)  ? ? ? ? ?REVIEW OF SYSTEMS: ?ROS   ?Negative  for: Constitutional, Gastrointestinal, Neurological, Skin, Genitourinary, Musculoskeletal, HENT, Endocrine, Cardiovascular, Eyes, Respiratory, Psychiatric, Allergic/Imm, Heme/Lymph ?Last edited by Silvestre Moment on 03/06/2022  3:54 PM.  ?  ? ? ? ?ALLERGIES ?Allergies  ?Allergen Reactions  ? Rosiglitazone Maleate Anaphylaxis and Swelling  ? Morphine Other (See Comments)  ?  SEVERE HYPOTENSION ?  ? Tramadol Other (See Comments)  ?  Unknown reaction  ? Cephalexin Diarrhea  ? Elavil [Amitriptyline] Nausea Only  ? Sulfa Antibiotics Diarrhea and Nausea And Vomiting  ? Tramadol-Acetaminophen Rash  ? ? ?PAST MEDICAL HISTORY ?Past Medical History:  ?Diagnosis Date  ? Acute combined systolic and  diastolic heart failure ([place]) 04/25/2018  ? Anxiety   ? Arthritis   ? "hands" (12/29/2017)  ? Basal cell carcinoma of nose   ? removed  ? Bursitis of left shoulder   ? BV ICD Medtronic Claria MRI CRTD 06/21/2019  ? Cat scratch fever   ? Late 90s  ? Chronic combined systolic and diastolic CHF (congestive heart failure) ([place]) 06/08/2019  ? Coronary artery disease   ? Depression   ? Diabetes mellitus, type II, insulin dependent ([place])   ? Encounter for assessment of implantable cardioverter-defibrillator (ICD) 09/28/2019  ? GERD (gastroesophageal reflux disease)   ? H. pylori infection 2008 and 1998  ? treated  ? Hyperlipidemia   ? Hypertension   ? Hypothyroidism   ? Low oxygen saturation   ? Macular degeneration   ? Multinodular goiter   ? Rotator cuff tear, left recurrent   ? Urge urinary incontinence   ? UTI (lower urinary tract infection) 05/2016  ? ?Past Surgical History:  ?Procedure Laterality Date  ? APPENDECTOMY  AGE 35  ? BACK SURGERY    ? BASAL CELL CARCINOMA EXCISION    ? "nose"  ? BIV ICD INSERTION CRT-D N/A 06/21/2019  ? Procedure: BIV ICD INSERTION CRT-D;  Surgeon: Evans Lance, MD;  Location: [facility];  Service: Cardiovascular;  Laterality: N/A;  ? BLADDER NECK SUSPENSION  1970's  ? BUNIONECTOMY WITH HAMMERTOE RECONSTRUCTION Bilateral   ? CARPAL TUNNEL RELEASE Right 05/2017  ? COLONOSCOPY W/ POLYPECTOMY    ? CORONARY ANGIOPLASTY WITH STENT PLACEMENT  12/29/2017  ? CORONARY BALLOON ANGIOPLASTY N/A 02/08/2019  ? Procedure: CORONARY BALLOON ANGIOPLASTY;  Surgeon: Adrian Prows, MD;  Location: [facility];  Service: Cardiovascular;  Laterality: N/A;  ? CORONARY STENT INTERVENTION N/A 12/29/2017  ? Procedure: CORONARY STENT INTERVENTION;  Surgeon: Nigel Mormon, MD;  Location: [facility];  Service: Cardiovascular;  Laterality: N/A;  ? CORONARY STENT INTERVENTION N/A 04/26/2018  ? Procedure: CORONARY STENT INTERVENTION;  Surgeon: Nigel Mormon, MD;  Location: [facility];   Service: Cardiovascular;  Laterality: N/A;  ? CORONARY STENT INTERVENTION N/A 02/08/2019  ? Procedure: CORONARY STENT INTERVENTION;  Surgeon: Adrian Prows, MD;  Location: [facility];  Service: Cardiovascular;  Laterality: N/A;  ? ESOPHAGOGASTRODUODENOSCOPY ENDOSCOPY    ? FORAMINAL DECOMPRESSION AT L2 TO THE SACRUM  01-05-2008  ? L2  -  S1  ? GANGLION CYST EXCISION Left 01/17/2009  ? ring finger  ? IMPLANTATION PERMANENT SPINAL CORD STIMULATOR  06-15-2008  ? JUNE 2013--  BATTERY CHANGE  ? JOINT REPLACEMENT    ? LEFT HEART CATH AND CORONARY ANGIOGRAPHY N/A 04/26/2018  ? Procedure: LEFT HEART CATH AND CORONARY ANGIOGRAPHY;  Surgeon: Nigel Mormon, MD;  Location: [facility];  Service: Cardiovascular;  Laterality: N/A;  ? LEFT HEART CATH AND CORONARY  ANGIOGRAPHY N/A 02/08/2019  ? Procedure: LEFT HEART CATH AND CORONARY ANGIOGRAPHY;  Surgeon: Adrian Prows, MD;  Location: [facility];  Service: Cardiovascular;  Laterality: N/A;  ? LIPOMA EXCISION Right   ? RIGHT ELBOW  ? METATARSAL HEAD EXCISION Right 06/16/2018  ? Procedure: right 5th metatarsal excision, a mini c-arm;  Surgeon: Trula Slade, DPM;  [facility];  Service: Podiatry;  Laterality: Right;  anesthesia can do block  ? RIGHT/LEFT HEART CATH AND CORONARY ANGIOGRAPHY N/A 12/29/2017  ? Procedure: RIGHT/LEFT HEART CATH AND CORONARY ANGIOGRAPHY;  Surgeon: Nigel Mormon, MD;  Location: [facility];  Service: Cardiovascular;  Laterality: N/A;  ? SHOULDER OPEN ROTATOR CUFF REPAIR  09/07/2012  ? Procedure: ROTATOR CUFF REPAIR SHOULDER OPEN;  Surgeon: Magnus Sinning, MD;  Location: [facility];  Service: Orthopedics;  Laterality: Left;  OPEN ANTERIOR ACROMIONECTOMY AND ROTATOR CUFF REPAIR ON LEFT   ? SHOULDER OPEN ROTATOR CUFF REPAIR Left 12/28/2012  ? Procedure: ROTATOR CUFF REPAIR SHOULDER OPEN;  Surgeon: Magnus Sinning, MD;  Location: [facility];  Service: Orthopedics;  Laterality: Left;   OPEN SHOULDER ROTATOR CUFF REPAIR ON LEFT  WITH ANTERIOR ACROMINECTOMY ?  ? SHOULDER OPEN ROTATOR CUFF REPAIR Right 03/28/2003  ? RIGHT SHOULDER  DEGENERATIVE AC JOINT AND RC TEAR  ? SPINAL CORD STIMUL

## 2022-03-06 NOTE — Assessment & Plan Note (Signed)
CSME center involved slowly improving OS today at 5-week interval.  We will repeat injection and follow-up again in 5-week ?

## 2022-03-06 NOTE — Assessment & Plan Note (Signed)
Currently physiologic may play a role in ongoing residual CME in the future we will continue to monitor ?

## 2022-03-06 NOTE — Assessment & Plan Note (Signed)
Center involved CSME much improved today at 5-week interval OD, follow-up again in 5 weeks after repeat injection today Avastin ?

## 2022-03-07 ENCOUNTER — Institutional Professional Consult (permissible substitution): Payer: Medicare PPO | Admitting: Internal Medicine

## 2022-03-17 ENCOUNTER — Ambulatory Visit: Payer: Medicare PPO | Admitting: Student

## 2022-03-17 ENCOUNTER — Encounter: Payer: Self-pay | Admitting: Student

## 2022-03-17 VITALS — BP 144/73 | HR 61 | Temp 98.3°F | Resp 16 | Ht 62.0 in | Wt 204.6 lb

## 2022-03-17 DIAGNOSIS — M7989 Other specified soft tissue disorders: Secondary | ICD-10-CM

## 2022-03-17 DIAGNOSIS — I5041 Acute combined systolic (congestive) and diastolic (congestive) heart failure: Secondary | ICD-10-CM

## 2022-03-17 DIAGNOSIS — I5042 Chronic combined systolic (congestive) and diastolic (congestive) heart failure: Secondary | ICD-10-CM

## 2022-03-17 NOTE — Progress Notes (Signed)
? ? ?Primary Physician:  [name], PA-C ? ? ?[name], female    DOB: [birth year], 81 y.o.   MRN: [id number]  ? ?Date: 03/17/22 ?Last Office Visit: 05/24/2021 ? ? ?Subjective:  ? ? ?Chief Complaint  ?Patient presents with  ? Coronary Artery Disease  ? heart failure management   ? Follow-up  ? ? ?HPI: Sara Graves  is a 81 y.o. female  with hypertension, insulin-dependent diabetes mellitus type 2,  CAD s/p prior LAD and ramus PCI,  HFrEF, ischemic cardiomyopathy, s/p BiV ICD implantation 06/2019,  h/o Rt leg cellulitis and metatarsal fracture (managed by Podiatry) with chief complaint of " 3 month followup for congestive heart failure."  ? ?Last hospitalized for congestive heart failure in March 2022 most likely secondary to dietary indiscretion after losing her son/grieving process.   ? ?Patient now presents for 50-monthfollow-up.  At last office visit patient had not been taking Repatha, deferred repeat lipid profile testing to PCP, which unfortunately available for review at today's office visit.  [place] however she had not been taking it at last office visit.  She states she is feeling relatively well overall, however she has noticed increased swelling in her left arm over the last 1 week.  Denies pain or injury.  During previous hospitalization patient developed arm swelling, upper extremity arterial duplex was normal at that time.  Denies worsening dyspnea.  Denies chest pain. ? ?Blood pressure is elevated in the office today, ever it is well controlled on home monitoring.  Notably patient is overall a remote patient monitoring of blood pressure. ? ?Past Medical History:  ?Diagnosis Date  ? Acute combined systolic and diastolic heart failure ([facility] 04/25/2018  ? Anxiety   ? Arthritis   ? "hands" (12/29/2017)  ? Basal cell carcinoma of nose   ? removed  ? Bursitis of left shoulder   ? BV ICD [employer] [place] MRI CRTD 06/21/2019  ? Cat scratch fever   ? Late 90s   ? Chronic combined systolic and diastolic CHF (congestive heart failure) ([facility] 06/08/2019  ? Coronary artery disease   ? Depression   ? Diabetes mellitus, type II, insulin dependent ([facility]   ? Encounter for assessment of implantable cardioverter-defibrillator (ICD) 09/28/2019  ? GERD (gastroesophageal reflux disease)   ? H. pylori infection 2008 and 1998  ? treated  ? Hyperlipidemia   ? Hypertension   ? Hypothyroidism   ? Low oxygen saturation   ? Macular degeneration   ? Multinodular goiter   ? Rotator cuff tear, left recurrent   ? Urge urinary incontinence   ? UTI (lower urinary tract infection) 05/2016  ? ?Family History  ?Problem Relation Age of Onset  ? Cancer Mother   ?     breast  ? Heart Problems Mother   ? Prostate cancer Father   ? Muscular dystrophy Son   ? Lung cancer Son   ? Cancer Maternal Grandmother   ?     breast  ? Heart disease Maternal Grandfather   ? Breast cancer Sister   ? ?Past Surgical History:  ?Procedure Laterality Date  ? APPENDECTOMY  AGE 23  ? BACK SURGERY    ? BASAL CELL CARCINOMA EXCISION    ? "nose"  ? BIV ICD INSERTION CRT-D N/A 06/21/2019  ? Procedure: BIV ICD INSERTION CRT-D;  Surgeon: TEvans Lance MD;  [facility];  Service: Cardiovascular;  Laterality: N/A;  ? BLADDER  NECK SUSPENSION  1970's  ? BUNIONECTOMY WITH HAMMERTOE RECONSTRUCTION Bilateral   ? CARPAL TUNNEL RELEASE Right 05/2017  ? COLONOSCOPY W/ POLYPECTOMY    ? CORONARY ANGIOPLASTY WITH STENT PLACEMENT  12/29/2017  ? CORONARY BALLOON ANGIOPLASTY N/A 02/08/2019  ? Procedure: CORONARY BALLOON ANGIOPLASTY;  Surgeon: Adrian Prows, MD;  Location: [facility];  Service: Cardiovascular;  Laterality: N/A;  ? CORONARY STENT INTERVENTION N/A 12/29/2017  ? Procedure: CORONARY STENT INTERVENTION;  Surgeon: Nigel Mormon, MD;  Location: [place] [facility];  Service: Cardiovascular;  Laterality: N/A;  ? CORONARY STENT INTERVENTION N/A 04/26/2018  ? Procedure: CORONARY STENT INTERVENTION;  Surgeon:  Nigel Mormon, MD;  Location: [facility];  Service: Cardiovascular;  Laterality: N/A;  ? CORONARY STENT INTERVENTION N/A 02/08/2019  ? Procedure: CORONARY STENT INTERVENTION;  Surgeon: Adrian Prows, MD;  Location: [facility];  Service: Cardiovascular;  Laterality: N/A;  ? ESOPHAGOGASTRODUODENOSCOPY ENDOSCOPY    ? FORAMINAL DECOMPRESSION AT L2 TO THE SACRUM  01-05-2008  ? L2  -  S1  ? GANGLION CYST EXCISION Left 01/17/2009  ? ring finger  ? IMPLANTATION PERMANENT SPINAL CORD STIMULATOR  06-15-2008  ? JUNE 2013--  BATTERY CHANGE  ? JOINT REPLACEMENT    ? LEFT HEART CATH AND CORONARY ANGIOGRAPHY N/A 04/26/2018  ? Procedure: LEFT HEART CATH AND CORONARY ANGIOGRAPHY;  Surgeon: Nigel Mormon, MD;  Location: [facility];  Service: Cardiovascular;  Laterality: N/A;  ? LEFT HEART CATH AND CORONARY ANGIOGRAPHY N/A 02/08/2019  ? Procedure: LEFT HEART CATH AND CORONARY ANGIOGRAPHY;  Surgeon: Adrian Prows, MD;  Location: [facility];  Service: Cardiovascular;  Laterality: N/A;  ? LIPOMA EXCISION Right   ? RIGHT ELBOW  ? METATARSAL HEAD EXCISION Right 06/16/2018  ? Procedure: right 5th metatarsal excision, a mini c-arm;  Surgeon: Trula Slade, DPM;  Location: [facility];  Service: Podiatry;  Laterality: Right;  anesthesia can do block  ? RIGHT/LEFT HEART CATH AND CORONARY ANGIOGRAPHY N/A 12/29/2017  ? Procedure: RIGHT/LEFT HEART CATH AND CORONARY ANGIOGRAPHY;  Surgeon: Nigel Mormon, MD;  Location: [facility];  Service: Cardiovascular;  Laterality: N/A;  ? SHOULDER OPEN ROTATOR CUFF REPAIR  09/07/2012  ? Procedure: ROTATOR CUFF REPAIR SHOULDER OPEN;  Surgeon: Magnus Sinning, MD;  Location: [facility];  Service: Orthopedics;  Laterality: Left;  OPEN ANTERIOR ACROMIONECTOMY AND ROTATOR CUFF REPAIR ON LEFT   ? SHOULDER OPEN ROTATOR CUFF REPAIR Left 12/28/2012  ? Procedure: ROTATOR CUFF REPAIR SHOULDER OPEN;  Surgeon: Magnus Sinning, MD;  Location: [facility];  Service: Orthopedics;  Laterality: Left;  OPEN SHOULDER ROTATOR CUFF REPAIR ON LEFT  WITH ANTERIOR ACROMINECTOMY ?  ? SHOULDER OPEN ROTATOR CUFF REPAIR Right 03/28/2003  ? RIGHT SHOULDER  DEGENERATIVE AC JOINT AND RC TEAR  ? SPINAL CORD STIMULATOR BATTERY EXCHANGE N/A 07/03/2016  ? Procedure: SPINAL CORD STIMULATOR BATTERY PLACMENT;  Surgeon: Melina Schools, MD;  Location: [facility];  Service: Orthopedics;  Laterality: N/A;  ? TONSILLECTOMY  AGE 71  ? TOTAL KNEE ARTHROPLASTY Right 05/06/2000  ? OA RIGHT KNEE  ? VAGINAL HYSTERECTOMY  1970's  ? ?Social History  ? ?Tobacco Use  ? Smoking status: Never  ? Smokeless tobacco: Never  ?Vaping Use  ? Vaping Use: Never used  ?Substance Use Topics  ? Alcohol use: No  ? Drug use: No  ? ? ?Review of Systems  ?Constitutional: Negative for chills, decreased appetite and malaise/fatigue.  ?  Cardiovascular:  Negative for chest pain, dyspnea on exertion, leg swelling, orthopnea and syncope.  ?     Left arm swelling  ?Respiratory:  Negative for cough.   ?Endocrine: Negative for cold intolerance.  ?Hematologic/Lymphatic: Does not bruise/bleed easily.  ?Musculoskeletal:  Negative for falls and joint swelling.  ?Gastrointestinal:  Negative for abdominal pain, anorexia, change in bowel habit, diarrhea and nausea.  ?Neurological:  Negative for headaches and light-headedness.  ?Psychiatric/Behavioral:  Negative for depression and substance abuse.   ?All other systems reviewed and are negative. ?   ?Objective:  ? ? ?  03/17/2022  ? 12:48 PM 02/06/2022  ?  9:26 AM 12/16/2021  ?  9:47 AM  ?Vitals with BMI  ?Height '5\' 2"'$  '5\' 2"'$  '5\' 2"'$   ?Weight 204 lbs 10 oz 208 lbs 6 oz 202 lbs  ?BMI 37.41 38.11 36.94  ?Systolic 017 494 496  ?Diastolic 73 74 66  ?Pulse 61 50 50  ? ?CONSTITUTIONAL: hemodynamically stable, no acute distress.  ?SKIN: Skin is warm and dry. No rash noted. No cyanosis. No pallor. No jaundice ?HEAD: Normocephalic and atraumatic.  ?EYES: No scleral icterus ?MOUTH/THROAT: Moist oral  membranes.  ?NECK: No JVD present. No thyromegaly noted.  Bilateral carotid bruits  ?LYMPHATIC: No visible cervical adenopathy.  ?CHEST Normal respiratory effort. No intercostal retractions.  BiV ICD site is c

## 2022-03-20 ENCOUNTER — Telehealth: Payer: Self-pay | Admitting: Student

## 2022-03-20 NOTE — Telephone Encounter (Signed)
External labs 09/19/2021: ?Total cholesterol 70, triglycerides 59, HDL 49, LDL 7 ?Hgb 11.7, HCT 34.9, MCV 96, platelet 146 ?BUN 25, creatinine 1.03, GFR 55, sodium 144, potassium 4.2 ?

## 2022-03-24 ENCOUNTER — Other Ambulatory Visit: Payer: Self-pay | Admitting: Pharmacist

## 2022-03-24 DIAGNOSIS — I5041 Acute combined systolic (congestive) and diastolic (congestive) heart failure: Secondary | ICD-10-CM

## 2022-03-24 MED ORDER — ENTRESTO 97-103 MG PO TABS: 1.0000 | ORAL_TABLET | Freq: Two times a day (BID) | ORAL | 3 refills | Status: DC

## 2022-03-24 MED ORDER — DAPAGLIFLOZIN PROPANEDIOL 10 MG PO TABS: 10.0000 mg | ORAL_TABLET | Freq: Every day | ORAL | 3 refills | Status: DC

## 2022-03-26 ENCOUNTER — Ambulatory Visit: Payer: Medicare PPO

## 2022-03-26 DIAGNOSIS — M7989 Other specified soft tissue disorders: Secondary | ICD-10-CM

## 2022-03-26 DIAGNOSIS — I5042 Chronic combined systolic (congestive) and diastolic (congestive) heart failure: Secondary | ICD-10-CM

## 2022-03-27 NOTE — Progress Notes (Signed)
Called and spoke with patient regarding her UEAD results.

## 2022-03-27 NOTE — Progress Notes (Signed)
No abnormality noted in the left arm to explain swelling.

## 2022-04-01 NOTE — Progress Notes (Signed)
Patient wanted you to know that she went to see her medical doctor and they are concerned that her left arm is staying swollen, but she just wanted to let you know, that if other concerns arise, her medication doctor (PCP) will call to talk to you.

## 2022-04-10 ENCOUNTER — Encounter (INDEPENDENT_AMBULATORY_CARE_PROVIDER_SITE_OTHER): Payer: Self-pay | Admitting: Ophthalmology

## 2022-04-10 ENCOUNTER — Encounter (INDEPENDENT_AMBULATORY_CARE_PROVIDER_SITE_OTHER): Payer: Medicare PPO | Admitting: Ophthalmology

## 2022-04-10 ENCOUNTER — Ambulatory Visit (INDEPENDENT_AMBULATORY_CARE_PROVIDER_SITE_OTHER): Payer: Medicare PPO | Admitting: Ophthalmology

## 2022-04-10 DIAGNOSIS — E113411 Type 2 diabetes mellitus with severe nonproliferative diabetic retinopathy with macular edema, right eye: Secondary | ICD-10-CM

## 2022-04-10 DIAGNOSIS — E113511 Type 2 diabetes mellitus with proliferative diabetic retinopathy with macular edema, right eye: Secondary | ICD-10-CM | POA: Diagnosis not present

## 2022-04-10 DIAGNOSIS — E113412 Type 2 diabetes mellitus with severe nonproliferative diabetic retinopathy with macular edema, left eye: Secondary | ICD-10-CM

## 2022-04-10 DIAGNOSIS — E113413 Type 2 diabetes mellitus with severe nonproliferative diabetic retinopathy with macular edema, bilateral: Secondary | ICD-10-CM

## 2022-04-10 MED ORDER — BEVACIZUMAB 2.5 MG/0.1ML IZ SOSY
2.5000 mg | PREFILLED_SYRINGE | INTRAVITREAL | Status: AC | PRN
  Administered 2022-04-10: 2.5 mg via INTRAVITREAL

## 2022-04-10 NOTE — Assessment & Plan Note (Signed)
Improved acuity and center involved CSME OS still active.  We will repeat injection Avastin OS today

## 2022-04-10 NOTE — Progress Notes (Signed)
04/10/2022     CHIEF COMPLAINT Patient presents for  Chief Complaint  Patient presents with   Diabetic Retinopathy with Macular Edema      HISTORY OF PRESENT ILLNESS: Sara Graves is a 81 y.o. female who presents to the clinic today for:   HPI   5 weeks dilate OU, Color FP, OCT. LBS: 97 this morning per patient. "I have been doing a lot of seek and find word books, I have been finding the words easily. I used to have to squint." Denies new FOL or floaters. Last edited by Laurin Coder on 04/10/2022  3:46 PM.      Referring physician: [name], PA-C [place],  [place] [id number]  HISTORICAL INFORMATION:   Selected notes from the MEDICAL RECORD NUMBER    Lab Results  Component Value Date   HGBA1C 9.8 (H) 02/12/2021     CURRENT MEDICATIONS: No current outpatient medications on file. (Ophthalmic Drugs)   No current facility-administered medications for this visit. (Ophthalmic Drugs)   Current Outpatient Medications (Other)  Medication Sig   alendronate (FOSAMAX) 70 MG tablet Take 1 tablet (70 mg total) by mouth once a week.   aspirin EC 81 MG tablet Take 81 mg by mouth daily.   busPIRone (BUSPAR) 5 MG tablet Take 5 mg by mouth 2 (two) times daily. As needed   Calcium Carb-Cholecalciferol (CALCIUM 600 + D PO) Take by mouth.   dapagliflozin propanediol (FARXIGA) 10 MG TABS tablet Take 1 tablet (10 mg total) by mouth daily.   ezetimibe (ZETIA) 10 MG tablet Take 10 mg by mouth daily.   gabapentin (NEURONTIN) 100 MG capsule Take 100 mg by mouth at bedtime.   glucose blood test strip 1 each 3 (three) times daily.    insulin aspart (NOVOLOG FLEXPEN) 100 UNIT/ML FlexPen Inject into the skin.   insulin degludec (TRESIBA) 100 UNIT/ML FlexTouch Pen Inject into the skin.   isosorbide dinitrate (ISORDIL) 40 MG tablet TAKE 1 TABLET BY MOUTH 3 TIMES DAILY.   levothyroxine (SYNTHROID, LEVOTHROID) 50 MCG tablet Take 50 mcg by mouth daily before breakfast.     Magnesium Oxide 500 MG TABS TAKE 1 TABLET (500 MG TOTAL) BY MOUTH IN THE MORNING AND AT BEDTIME. (Patient taking differently: Take 500 mg by mouth in the morning, at noon, and at bedtime.)   methocarbamol (ROBAXIN) 500 MG tablet Take 500 mg by mouth 4 (four) times daily.   metoprolol succinate (TOPROL-XL) 50 MG 24 hr tablet TAKE 1 TABLET BY MOUTH EVERY DAY WITH OR IMMEDIATELY FOLLOWING A MEAL   nitroGLYCERIN (NITROSTAT) 0.4 MG SL tablet PLACE 1 TABLET UNDER THE TONGUE EVERY 5 MINUTES X 3 DOSES AS NEEDED FOR CHEST PAIN. (Patient taking differently: Place 0.4 mg under the tongue every 5 (five) minutes as needed for chest pain.)   ONETOUCH VERIO test strip 1 each by Other route 3 (three) times daily.   rosuvastatin (CRESTOR) 40 MG tablet Take 1 tablet (40 mg total) by mouth at bedtime.   sacubitril-valsartan (ENTRESTO) 97-103 MG Take 1 tablet by mouth 2 (two) times daily.   torsemide (DEMADEX) 20 MG tablet Take 20 mg by mouth every other day.   vitamin B-12 (CYANOCOBALAMIN) 1000 MCG tablet Take 1,000 mcg by mouth daily.    Vitamin D, Ergocalciferol, (DRISDOL) 50000 units CAPS capsule Take 50,000 Units by mouth every 7 (seven) days. Take on Thursdays   No current facility-administered medications for this visit. (Other)  REVIEW OF SYSTEMS: ROS   Negative for: Constitutional, Gastrointestinal, Neurological, Skin, Genitourinary, Musculoskeletal, HENT, Endocrine, Cardiovascular, Eyes, Respiratory, Psychiatric, Allergic/Imm, Heme/Lymph Last edited by Hurman Horn, MD on 04/10/2022  4:06 PM.       ALLERGIES Allergies  Allergen Reactions   Rosiglitazone Maleate Anaphylaxis and Swelling   Morphine Other (See Comments)    SEVERE HYPOTENSION    Tramadol Other (See Comments)    Unknown reaction   Cephalexin Diarrhea   Elavil [Amitriptyline] Nausea Only   Sulfa Antibiotics Diarrhea and Nausea And Vomiting   Tramadol-Acetaminophen Rash    PAST MEDICAL HISTORY Past Medical History:   Diagnosis Date   Acute combined systolic and diastolic heart failure ([place]) 04/25/2018   Anxiety    Arthritis    "hands" (12/29/2017)   Basal cell carcinoma of nose    removed   Bursitis of left shoulder    BV ICD Med[employer] [place] MRI CRTD 06/21/2019   Cat scratch fever    Late 90s   Chronic combined systolic and diastolic CHF (congestive heart failure) ([place]) 06/08/2019   Coronary artery disease    Depression    Diabetes mellitus, type II, insulin dependent ([place])    Encounter for assessment of implantable cardioverter-defibrillator (ICD) 09/28/2019   GERD (gastroesophageal reflux disease)    H. pylori infection 2008 and 1998   treated   Hyperlipidemia    Hypertension    Hypothyroidism    Low oxygen saturation    Macular degeneration    Multinodular goiter    Rotator cuff tear, left recurrent    Urge urinary incontinence    UTI (lower urinary tract infection) 05/2016   Past Surgical History:  Procedure Laterality Date   APPENDECTOMY  AGE 75   BACK SURGERY     BASAL CELL CARCINOMA EXCISION     "nose"   BIV ICD INSERTION CRT-D N/A 06/21/2019   Procedure: BIV ICD INSERTION CRT-D;  Surgeon: Evans Lance, MD;  Location: [facility];  Service: Cardiovascular;  Laterality: N/A;   BLADDER NECK SUSPENSION  1970's   BUNIONECTOMY WITH HAMMERTOE RECONSTRUCTION Bilateral    CARPAL TUNNEL RELEASE Right 05/2017   COLONOSCOPY W/ POLYPECTOMY     CORONARY ANGIOPLASTY WITH STENT PLACEMENT  12/29/2017   CORONARY BALLOON ANGIOPLASTY N/A 02/08/2019   Procedure: CORONARY BALLOON ANGIOPLASTY;  Surgeon: Adrian Prows, MD;  Location: [facility];  Service: Cardiovascular;  Laterality: N/A;   CORONARY STENT INTERVENTION N/A 12/29/2017   Procedure: CORONARY STENT INTERVENTION;  Surgeon: Nigel Mormon, MD;  Location: [facility];  Service: Cardiovascular;  Laterality: N/A;   CORONARY STENT INTERVENTION N/A 04/26/2018   Procedure: CORONARY STENT INTERVENTION;  Surgeon:  Nigel Mormon, MD;  Location: [facility];  Service: Cardiovascular;  Laterality: N/A;   CORONARY STENT INTERVENTION N/A 02/08/2019   Procedure: CORONARY STENT INTERVENTION;  Surgeon: Adrian Prows, MD;  Location: [facility];  Service: Cardiovascular;  Laterality: N/A;   ESOPHAGOGASTRODUODENOSCOPY ENDOSCOPY     FORAMINAL DECOMPRESSION AT L2 TO THE SACRUM  01-05-2008   L2  -  S1   GANGLION CYST EXCISION Left 01/17/2009   ring finger   IMPLANTATION PERMANENT SPINAL CORD STIMULATOR  06-15-2008   JUNE 2013--  BATTERY CHANGE   JOINT REPLACEMENT     LEFT HEART CATH AND CORONARY ANGIOGRAPHY N/A 04/26/2018   Procedure: LEFT HEART CATH AND CORONARY ANGIOGRAPHY;  Surgeon: Nigel Mormon, MD;  Location: [facility];  Service: Cardiovascular;  Laterality: N/A;   LEFT HEART CATH AND CORONARY ANGIOGRAPHY N/A 02/08/2019   Procedure: LEFT HEART CATH AND CORONARY ANGIOGRAPHY;  Surgeon: Adrian Prows, MD;  Location: [facility];  Service: Cardiovascular;  Laterality: N/A;   LIPOMA EXCISION Right    RIGHT ELBOW   METATARSAL HEAD EXCISION Right 06/16/2018   Procedure: right 5th metatarsal excision, a mini c-arm;  Surgeon: Trula Slade, DPM;  Location: [facility];  Service: Podiatry;  Laterality: Right;  anesthesia can do block   RIGHT/LEFT HEART CATH AND CORONARY ANGIOGRAPHY N/A 12/29/2017   Procedure: RIGHT/LEFT HEART CATH AND CORONARY ANGIOGRAPHY;  Surgeon: Nigel Mormon, MD;  Location: [facility];  Service: Cardiovascular;  Laterality: N/A;   SHOULDER OPEN ROTATOR CUFF REPAIR  09/07/2012   Procedure: ROTATOR CUFF REPAIR SHOULDER OPEN;  Surgeon: Magnus Sinning, MD;  Location: [facility];  Service: Orthopedics;  Laterality: Left;  OPEN ANTERIOR ACROMIONECTOMY AND ROTATOR CUFF REPAIR ON LEFT    SHOULDER OPEN ROTATOR CUFF REPAIR Left 12/28/2012   Procedure: ROTATOR CUFF REPAIR SHOULDER OPEN;  Surgeon: Magnus Sinning, MD;  Location: [facility];  Service: Orthopedics;  Laterality: Left;  OPEN SHOULDER ROTATOR CUFF REPAIR ON LEFT  WITH ANTERIOR ACROMINECTOMY    SHOULDER OPEN ROTATOR CUFF REPAIR Right 03/28/2003   RIGHT SHOULDER  DEGENERATIVE AC JOINT AND RC TEAR   SPINAL CORD STIMULATOR BATTERY EXCHANGE N/A 07/03/2016   Procedure: SPINAL CORD STIMULATOR BATTERY PLACMENT;  Surgeon: Melina Schools, MD;  Location: [facility];  Service: Orthopedics;  Laterality: N/A;   TONSILLECTOMY  AGE 61   TOTAL KNEE ARTHROPLASTY Right 05/06/2000   OA RIGHT KNEE   VAGINAL HYSTERECTOMY  1970's    FAMILY HISTORY Family History  Problem Relation Age of Onset   Cancer Mother        breast   Heart Problems Mother    Prostate cancer Father    Muscular dystrophy Son    Lung cancer Son    Cancer Maternal Grandmother        breast   Heart disease Maternal Grandfather    Breast cancer Sister     SOCIAL HISTORY Social History   Tobacco Use   Smoking status: Never   Smokeless tobacco: Never  Vaping Use   Vaping Use: Never used  Substance Use Topics   Alcohol use: No   Drug use: No         OPHTHALMIC EXAM:  Base Eye Exam     Visual Acuity (ETDRS)       Right Left   [place] 20/25 -2 20/25 -1         Tonometry (Tonopen, 3:46 PM)       Right Left   Pressure 15 15         Pupils       Pupils Dark Light APD   Right PERRL 3 2 None   Left PERRL 3 2 None         Extraocular Movement       Right Left    Full Full         Neuro/Psych     Oriented x3: Yes   Mood/Affect: Normal         Dilation     Both eyes: 1.0% Mydriacyl, 2.5% Phenylephrine @ 3:46 PM           Slit Lamp and Fundus Exam     External Exam  Right Left   External Normal Normal         Slit Lamp Exam       Right Left   Lids/Lashes Normal Normal   Conjunctiva/Sclera White and quiet White and quiet   Cornea Clear Clear   Anterior Chamber Deep and quiet Deep and quiet   Iris Round and reactive Round and reactive    Lens Posterior chamber intraocular lens Posterior chamber intraocular lens   Anterior Vitreous Normal Normal         Fundus Exam       Right Left   Posterior Vitreous Normal Normal   Disc Normal Normal   C/D Ratio 0.3 0.3   Macula Microaneurysms, Macular thickening, Moderate clinically significant macular edema, center involved Microaneurysms, Macular thickening, Moderate clinically significant macular edema, superotemporal to FAZ   Vessels NPDR-Severe, with NVE INF NASAL TO NERVE NPDR-Severe   Periphery Normal Normal            IMAGING AND PROCEDURES  Imaging and Procedures for 04/10/22  OCT, Retina - OU - Both Eyes       Right Eye Quality was good. Scan locations included subfoveal. Central Foveal Thickness: 342. Progression has worsened. Findings include abnormal foveal contour.   Left Eye Quality was good. Scan locations included extrafoveal. Central Foveal Thickness: 357. Progression has improved. Findings include abnormal foveal contour.   Notes Center involved CSME OD, improved today at interval of follow-up of 5 weeks will need repeat injection today and reevaluate right IN 4 to 5 weeks OS also center involved CSME improving Yet needs repeat dilation as scheduled much improved at 5 week today post recent injection, will repeat injection OS today as well     Color Fundus Photography Optos - OU - Both Eyes       Right Eye Progression has no prior data. Disc findings include normal observations. Macula : microaneurysms, edema. Vessels : Neovascularization.   Left Eye Disc findings include normal observations. Macula : microaneurysms.   Notes Small retinal neovascularization seen on the inferonasal arcade to disc area diameters from the nerve, now regression ongoing, OD, good mid peripheral PRP inferonasal nasal  Severe NPDR OS,     Intravitreal Injection, Pharmacologic Agent - OS - Left Eye       Time Out 04/10/2022. 4:12 PM. Confirmed correct  patient, procedure, site, and patient consented.   Anesthesia Topical anesthesia was used. Anesthetic medications included Lidocaine 4%.   Procedure Preparation included Tobramycin 0.3%, Ofloxacin , 5% betadine to ocular surface, 10% betadine to eyelids. A 30 gauge needle was used.   Injection: 2.5 mg bevacizumab 2.5 MG/0.1ML   Route: Intravitreal, Site: Left Eye   NDC: [phone], Lot: [id number], Expiration date: 06/15/2022   Post-op Post injection exam found visual acuity of at least counting fingers. The patient tolerated the procedure well. There were no complications. The patient received written and verbal post procedure care education. Post injection medications included ocuflox.              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  We will repeat injection Avastin OS today     ICD-10-CM   1.  Severe nonproliferative diabetic retinopathy of left eye, with macular edema, associated with type 2 diabetes mellitus ([facility])  J85.6314 OCT, Retina - OU - Both Eyes    Color Fundus Photography Optos - OU - Both Eyes    Intravitreal Injection, Pharmacologic Agent - OS - Left Eye    bevacizumab (AVASTIN) SOSY 2.5 mg    2. Severe nonproliferative diabetic retinopathy of right eye, with macular edema, associated with type 2 diabetes mellitus ([facility])  E11.3411 OCT, Retina - OU - Both Eyes    Color Fundus Photography Optos - OU - Both Eyes    3. 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  Center involved CSME persistent  but improved OS with improved acuity.  Repeat injection Avastin OS today for CME, CSME and severe NPDR and will bombe need PRP peripherally  2.  OD, schedule peripheral PRP #1 soon  3.  Ophthalmic Meds Ordered this visit:  Meds ordered this encounter  Medications   bevacizumab (AVASTIN) SOSY 2.5 mg       Return in about 5 weeks (around 05/15/2022) for OD, PRP #1 for severe NPDR, nasally first,,, 5 weeks dilate OS Avastin OCT.  There are no Patient Instructions on file for this visit.   Explained the diagnoses, plan, and follow up with the patient and they expressed understanding.  Patient expressed understanding of the importance of proper follow up care.   Clent Demark Darianny Momon M.D. Diseases & Surgery of the Retina and Vitreous Retina & Diabetic [place] 04/10/22     Abbreviations: M myopia (nearsighted); A astigmatism; H hyperopia (farsighted); P presbyopia; Mrx spectacle prescription;  CTL contact lenses; OD right eye; OS left eye; OU both eyes  XT exotropia; ET esotropia; PEK punctate epithelial keratitis; PEE punctate epithelial erosions; DES dry eye syndrome; MGD meibomian gland dysfunction; ATs artificial tears; PFAT's preservative free artificial tears; [place] nuclear sclerotic cataract; PSC posterior subcapsular cataract; ERM epi-retinal membrane; PVD posterior vitreous detachment; RD retinal detachment; DM diabetes mellitus; DR diabetic retinopathy; NPDR non-proliferative diabetic retinopathy; PDR proliferative diabetic retinopathy; CSME clinically significant macular edema; DME diabetic macular edema; dbh dot blot hemorrhages; CWS cotton wool spot; POAG primary open angle glaucoma; C/D cup-to-disc ratio; HVF humphrey visual field; GVF goldmann visual field; OCT optical coherence tomography; IOP intraocular pressure; BRVO Branch retinal vein occlusion; CRVO central retinal vein occlusion; CRAO central retinal artery occlusion; BRAO branch retinal artery occlusion; RT retinal tear; SB  scleral buckle; PPV pars plana vitrectomy; VH Vitreous hemorrhage; PRP panretinal laser photocoagulation; IVK intravitreal kenalog; VMT vitreomacular traction; MH Macular hole;  NVD neovascularization of the disc; NVE neovascularization elsewhere; AREDS age related eye disease study; ARMD age related macular degeneration; POAG primary open angle glaucoma; EBMD epithelial/anterior basement membrane dystrophy; ACIOL anterior chamber intraocular lens; IOL intraocular lens; PCIOL posterior chamber intraocular lens; Phaco/IOL phacoemulsification with intraocular lens placement; [place] photorefractive keratectomy; LASIK laser assisted in situ keratomileusis; HTN hypertension; DM diabetes mellitus; COPD chronic obstructive pulmonary disease

## 2022-04-10 NOTE — Assessment & Plan Note (Signed)
OD improved center involved CSME today, follow-up in 3 to 4 weeks PRP #1 nasally to prevent progression to PDR

## 2022-05-15 ENCOUNTER — Encounter (INDEPENDENT_AMBULATORY_CARE_PROVIDER_SITE_OTHER): Payer: Self-pay | Admitting: Ophthalmology

## 2022-05-15 ENCOUNTER — Ambulatory Visit (INDEPENDENT_AMBULATORY_CARE_PROVIDER_SITE_OTHER): Payer: Medicare PPO | Admitting: Ophthalmology

## 2022-05-15 DIAGNOSIS — E113511 Type 2 diabetes mellitus with proliferative diabetic retinopathy with macular edema, right eye: Secondary | ICD-10-CM

## 2022-05-15 DIAGNOSIS — E113412 Type 2 diabetes mellitus with severe nonproliferative diabetic retinopathy with macular edema, left eye: Secondary | ICD-10-CM

## 2022-05-15 NOTE — Progress Notes (Signed)
05/15/2022     CHIEF COMPLAINT Patient presents for  Chief Complaint  Patient presents with   Diabetic Retinopathy without Macular Edema      HISTORY OF PRESENT ILLNESS: Sara Graves is a 81 y.o. female who presents to the clinic today for:     Referring physician: [name], PA-C [place],  [place] [id number]  HISTORICAL INFORMATION:   Selected notes from the [facility]    Lab Results  Component Value Date   HGBA1C 9.8 (H) 02/12/2021     CURRENT MEDICATIONS: No current outpatient medications on file. (Ophthalmic Drugs)   No current facility-administered medications for this visit. (Ophthalmic Drugs)   Current Outpatient Medications (Other)  Medication Sig   alendronate (FOSAMAX) 70 MG tablet Take 1 tablet (70 mg total) by mouth once a week.   aspirin EC 81 MG tablet Take 81 mg by mouth daily.   busPIRone (BUSPAR) 5 MG tablet Take 5 mg by mouth 2 (two) times daily. As needed   Calcium Carb-Cholecalciferol (CALCIUM 600 + D PO) Take by mouth.   dapagliflozin propanediol (FARXIGA) 10 MG TABS tablet Take 1 tablet (10 mg total) by mouth daily.   ezetimibe (ZETIA) 10 MG tablet Take 10 mg by mouth daily.   gabapentin (NEURONTIN) 100 MG capsule Take 100 mg by mouth at bedtime.   glucose blood test strip 1 each 3 (three) times daily.    insulin aspart (NOVOLOG FLEXPEN) 100 UNIT/ML FlexPen Inject into the skin.   insulin degludec (TRESIBA) 100 UNIT/ML FlexTouch Pen Inject into the skin.   isosorbide dinitrate (ISORDIL) 40 MG tablet TAKE 1 TABLET BY MOUTH 3 TIMES DAILY.   levothyroxine (SYNTHROID, LEVOTHROID) 50 MCG tablet Take 50 mcg by mouth daily before breakfast.    Magnesium Oxide 500 MG TABS TAKE 1 TABLET (500 MG TOTAL) BY MOUTH IN THE MORNING AND AT BEDTIME. (Patient taking differently: Take 500 mg by mouth in the morning, at noon, and at bedtime.)   methocarbamol (ROBAXIN) 500 MG tablet Take 500 mg by mouth 4 (four) times daily.    metoprolol succinate (TOPROL-XL) 50 MG 24 hr tablet TAKE 1 TABLET BY MOUTH EVERY DAY WITH OR IMMEDIATELY FOLLOWING A MEAL   nitroGLYCERIN (NITROSTAT) 0.4 MG SL tablet PLACE 1 TABLET UNDER THE TONGUE EVERY 5 MINUTES X 3 DOSES AS NEEDED FOR CHEST PAIN. (Patient taking differently: Place 0.4 mg under the tongue every 5 (five) minutes as needed for chest pain.)   ONETOUCH VERIO test strip 1 each by Other route 3 (three) times daily.   rosuvastatin (CRESTOR) 40 MG tablet Take 1 tablet (40 mg total) by mouth at bedtime.   sacubitril-valsartan (ENTRESTO) 97-103 MG Take 1 tablet by mouth 2 (two) times daily.   torsemide (DEMADEX) 20 MG tablet Take 20 mg by mouth every other day.   vitamin B-12 (CYANOCOBALAMIN) 1000 MCG tablet Take 1,000 mcg by mouth daily.    Vitamin D, Ergocalciferol, (DRISDOL) 50000 units CAPS capsule Take 50,000 Units by mouth every 7 (seven) days. Take on Thursdays   No current facility-administered medications for this visit. (Other)      REVIEW OF SYSTEMS: ROS   Negative for: Constitutional, Gastrointestinal, Neurological, Skin, Genitourinary, Musculoskeletal, HENT, Endocrine, Cardiovascular, Eyes, Respiratory, Psychiatric, Allergic/Imm, Heme/Lymph Last edited by Hurman Horn, MD on 05/15/2022  2:11 PM.       ALLERGIES Allergies  Allergen Reactions   Rosiglitazone Maleate Anaphylaxis and Swelling   Morphine Other (See Comments)  SEVERE HYPOTENSION    Tramadol Other (See Comments)    Unknown reaction   Cephalexin Diarrhea   Elavil [Amitriptyline] Nausea Only   Sulfa Antibiotics Diarrhea and Nausea And Vomiting   Tramadol-Acetaminophen Rash    PAST MEDICAL HISTORY Past Medical History:  Diagnosis Date   Acute combined systolic and diastolic heart failure ([place]) 04/25/2018   Anxiety    Arthritis    "hands" (12/29/2017)   Basal cell carcinoma of nose    removed   Bursitis of left shoulder    BV ICD Med[employer]onic [place] MRI CRTD 06/21/2019   Cat scratch fever     Late 90s   Chronic combined systolic and diastolic CHF (congestive heart failure) ([place]) 06/08/2019   Coronary artery disease    Depression    Diabetes mellitus, type II, insulin dependent ([place])    Encounter for assessment of implantable cardioverter-defibrillator (ICD) 09/28/2019   GERD (gastroesophageal reflux disease)    H. pylori infection 2008 and 1998   treated   Hyperlipidemia    Hypertension    Hypothyroidism    Low oxygen saturation    Macular degeneration    Multinodular goiter    Rotator cuff tear, left recurrent    Urge urinary incontinence    UTI (lower urinary tract infection) 05/2016   Past Surgical History:  Procedure Laterality Date   APPENDECTOMY  AGE 40   BACK SURGERY     BASAL CELL CARCINOMA EXCISION     "nose"   BIV ICD INSERTION CRT-D N/A 06/21/2019   Procedure: BIV ICD INSERTION CRT-D;  Surgeon: Evans Lance, MD;  Location: [facility];  Service: Cardiovascular;  Laterality: N/A;   BLADDER NECK SUSPENSION  1970's   BUNIONECTOMY WITH HAMMERTOE RECONSTRUCTION Bilateral    CARPAL TUNNEL RELEASE Right 05/2017   COLONOSCOPY W/ POLYPECTOMY     CORONARY ANGIOPLASTY WITH STENT PLACEMENT  12/29/2017   CORONARY BALLOON ANGIOPLASTY N/A 02/08/2019   Procedure: CORONARY BALLOON ANGIOPLASTY;  Surgeon: Adrian Prows, MD;  Location: [facility];  Service: Cardiovascular;  Laterality: N/A;   CORONARY STENT INTERVENTION N/A 12/29/2017   Procedure: CORONARY STENT INTERVENTION;  Surgeon: Nigel Mormon, MD;  Location: [facility];  Service: Cardiovascular;  Laterality: N/A;   CORONARY STENT INTERVENTION N/A 04/26/2018   Procedure: CORONARY STENT INTERVENTION;  Surgeon: Nigel Mormon, MD;  Location: [facility];  Service: Cardiovascular;  Laterality: N/A;   CORONARY STENT INTERVENTION N/A 02/08/2019   Procedure: CORONARY STENT INTERVENTION;  Surgeon: Adrian Prows, MD;  Location: [facility];  Service: Cardiovascular;  Laterality: N/A;    ESOPHAGOGASTRODUODENOSCOPY ENDOSCOPY     FORAMINAL DECOMPRESSION AT L2 TO THE SACRUM  01-05-2008   L2  -  S1   GANGLION CYST EXCISION Left 01/17/2009   ring finger   IMPLANTATION PERMANENT SPINAL CORD STIMULATOR  06-15-2008   JUNE 2013--  BATTERY CHANGE   JOINT REPLACEMENT     LEFT HEART CATH AND CORONARY ANGIOGRAPHY N/A 04/26/2018   Procedure: LEFT HEART CATH AND CORONARY ANGIOGRAPHY;  Surgeon: Nigel Mormon, MD;  Location: [facility];  Service: Cardiovascular;  Laterality: N/A;   LEFT HEART CATH AND CORONARY ANGIOGRAPHY N/A 02/08/2019   Procedure: LEFT HEART CATH AND CORONARY ANGIOGRAPHY;  Surgeon: Adrian Prows, MD;  Location: [facility];  Service: Cardiovascular;  Laterality: N/A;   LIPOMA EXCISION Right    RIGHT ELBOW   METATARSAL HEAD EXCISION Right 06/16/2018   Procedure: right 5th metatarsal  excision, a mini c-arm;  Surgeon: Trula Slade, DPM;  Location: [facility];  Service: Podiatry;  Laterality: Right;  anesthesia can do block   RIGHT/LEFT HEART CATH AND CORONARY ANGIOGRAPHY N/A 12/29/2017   Procedure: RIGHT/LEFT HEART CATH AND CORONARY ANGIOGRAPHY;  Surgeon: Nigel Mormon, MD;  Location: [facility];  Service: Cardiovascular;  Laterality: N/A;   SHOULDER OPEN ROTATOR CUFF REPAIR  09/07/2012   Procedure: ROTATOR CUFF REPAIR SHOULDER OPEN;  Surgeon: Magnus Sinning, MD;  Location: [facility];  Service: Orthopedics;  Laterality: Left;  OPEN ANTERIOR ACROMIONECTOMY AND ROTATOR CUFF REPAIR ON LEFT    SHOULDER OPEN ROTATOR CUFF REPAIR Left 12/28/2012   Procedure: ROTATOR CUFF REPAIR SHOULDER OPEN;  Surgeon: Magnus Sinning, MD;  Location: [facility];  Service: Orthopedics;  Laterality: Left;  OPEN SHOULDER ROTATOR CUFF REPAIR ON LEFT  WITH ANTERIOR ACROMINECTOMY    SHOULDER OPEN ROTATOR CUFF REPAIR Right 03/28/2003   RIGHT SHOULDER  DEGENERATIVE AC JOINT AND RC TEAR   SPINAL CORD STIMULATOR BATTERY EXCHANGE N/A  07/03/2016   Procedure: SPINAL CORD STIMULATOR BATTERY PLACMENT;  Surgeon: Melina Schools, MD;  Location: [facility];  Service: Orthopedics;  Laterality: N/A;   TONSILLECTOMY  AGE 61   TOTAL KNEE ARTHROPLASTY Right 05/06/2000   OA RIGHT KNEE   VAGINAL HYSTERECTOMY  1970's    FAMILY HISTORY Family History  Problem Relation Age of Onset   Cancer Mother        breast   Heart Problems Mother    Prostate cancer Father    Muscular dystrophy Son    Lung cancer Son    Cancer Maternal Grandmother        breast   Heart disease Maternal Grandfather    Breast cancer Sister     SOCIAL HISTORY Social History   Tobacco Use   Smoking status: Never   Smokeless tobacco: Never  Vaping Use   Vaping Use: Never used  Substance Use Topics   Alcohol use: No   Drug use: No         OPHTHALMIC EXAM:  Base Eye Exam     Visual Acuity (ETDRS)       Right Left   Dist [place] 20/30 +2 20/25 -1         Tonometry (Tonopen, 2:16 PM)       Right Left   Pressure 8 8         Neuro/Psych     Oriented x3: Yes   Mood/Affect: Normal         Dilation     Right eye: 1.0% Mydriacyl, 2.5% Phenylephrine @ 2:14 PM           Slit Lamp and Fundus Exam     External Exam       Right Left   External Normal Normal         Slit Lamp Exam       Right Left   Lids/Lashes Normal Normal   Conjunctiva/Sclera White and quiet White and quiet   Cornea Clear Clear   Anterior Chamber Deep and quiet Deep and quiet   Iris Round and reactive Round and reactive   Lens Posterior chamber intraocular lens Posterior chamber intraocular lens   Anterior Vitreous Normal Normal         Fundus Exam       Right Left   Posterior Vitreous Normal    Disc Normal    C/D Ratio 0.3  Macula Microaneurysms, Macular thickening, Moderate clinically significant macular edema, center involved    Vessels NPDR-Severe, with NVE INF NASAL TO NERVE    Periphery PRP 270, needs PRP completion superiorly OD              IMAGING AND PROCEDURES  Imaging and Procedures for 05/15/22  Panretinal Photocoagulation - OD - Right Eye       Time Out Confirmed correct patient, procedure, site, and patient consented.   Anesthesia Topical anesthesia was used. Anesthetic medications included Proparacaine 0.5%.   Laser Information The type of laser was diode. Color was yellow. The duration in seconds was 0.03. The spot size was 390 microns. Laser power was 300. Total spots was 591.   Post-op The patient tolerated the procedure well. There were no complications. The patient received written and verbal post procedure care education.   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 Severe nonproliferative diabetic retinopathy of left eye, with macular edema, associated with type 2 diabetes mellitus ([place])  W40.9735       1.  OD with PDR, completion of PRP today with PRP delivered superiorly and slightly temporally.  2.  OS with center involved CSME, follow-up next week as scheduled for likely Avastin OCT  3.  Ophthalmic Meds Ordered this visit:  No orders of the defined types were placed in this encounter.      Return for As scheduled next week Avastin OCT OS.  There are no Patient Instructions on file for this visit.   Explained the diagnoses, plan, and follow up with the patient and they expressed understanding.  Patient  expressed understanding of the importance of proper follow up care.   Clent Demark Jakyiah Briones M.D. Diseases & Surgery of the Retina and Vitreous Retina & Diabetic [place] 05/15/22     Abbreviations: M myopia (nearsighted); A astigmatism; H hyperopia (farsighted); P presbyopia; Mrx spectacle prescription;  CTL contact lenses; OD right eye; OS left eye; OU both eyes  XT exotropia; ET esotropia; PEK punctate epithelial keratitis; PEE punctate epithelial erosions; DES dry eye syndrome; MGD meibomian gland dysfunction; ATs artificial tears; PFAT's preservative free artificial tears; [place] nuclear sclerotic cataract; PSC posterior subcapsular cataract; ERM epi-retinal membrane; PVD posterior vitreous detachment; RD retinal detachment; DM diabetes mellitus; DR diabetic retinopathy; NPDR non-proliferative diabetic retinopathy; PDR proliferative diabetic retinopathy; CSME clinically significant macular edema; DME diabetic macular edema; dbh dot blot hemorrhages; CWS cotton wool spot; POAG primary open angle glaucoma; C/D cup-to-disc ratio; HVF humphrey visual field; GVF goldmann visual field; OCT optical coherence tomography; IOP intraocular pressure; BRVO Branch retinal vein occlusion; CRVO central retinal vein occlusion; CRAO central retinal artery occlusion; BRAO branch retinal artery occlusion; RT retinal tear; SB scleral buckle; PPV pars plana vitrectomy; VH Vitreous hemorrhage; PRP panretinal laser photocoagulation; IVK intravitreal kenalog; VMT vitreomacular traction; MH Macular hole;  NVD neovascularization of the disc; NVE neovascularization elsewhere; AREDS age related  eye disease study; ARMD age related macular degeneration; POAG primary open angle glaucoma; EBMD epithelial/anterior basement membrane dystrophy; ACIOL anterior chamber intraocular lens; IOL intraocular lens; PCIOL posterior chamber intraocular lens; Phaco/IOL phacoemulsification with intraocular lens placement; [place] photorefractive keratectomy;  LASIK laser assisted in situ keratomileusis; HTN hypertension; DM diabetes mellitus; COPD chronic obstructive pulmonary disease

## 2022-05-15 NOTE — Assessment & Plan Note (Signed)
Follow-up OS as scheduled

## 2022-05-15 NOTE — Assessment & Plan Note (Addendum)
Plan PRP review of PDR progression, to superior retina OD today

## 2022-05-16 ENCOUNTER — Other Ambulatory Visit: Payer: Self-pay | Admitting: Physician Assistant

## 2022-05-19 ENCOUNTER — Encounter (INDEPENDENT_AMBULATORY_CARE_PROVIDER_SITE_OTHER): Payer: Self-pay | Admitting: Ophthalmology

## 2022-05-19 ENCOUNTER — Ambulatory Visit (INDEPENDENT_AMBULATORY_CARE_PROVIDER_SITE_OTHER): Payer: Medicare PPO | Admitting: Ophthalmology

## 2022-05-19 ENCOUNTER — Other Ambulatory Visit: Payer: Self-pay | Admitting: Physician Assistant

## 2022-05-19 DIAGNOSIS — E113412 Type 2 diabetes mellitus with severe nonproliferative diabetic retinopathy with macular edema, left eye: Secondary | ICD-10-CM

## 2022-05-19 DIAGNOSIS — E113413 Type 2 diabetes mellitus with severe nonproliferative diabetic retinopathy with macular edema, bilateral: Secondary | ICD-10-CM

## 2022-05-19 DIAGNOSIS — E113511 Type 2 diabetes mellitus with proliferative diabetic retinopathy with macular edema, right eye: Secondary | ICD-10-CM

## 2022-05-19 DIAGNOSIS — R600 Localized edema: Secondary | ICD-10-CM

## 2022-05-19 DIAGNOSIS — H43823 Vitreomacular adhesion, bilateral: Secondary | ICD-10-CM

## 2022-05-19 DIAGNOSIS — E113411 Type 2 diabetes mellitus with severe nonproliferative diabetic retinopathy with macular edema, right eye: Secondary | ICD-10-CM | POA: Diagnosis not present

## 2022-05-19 MED ORDER — BEVACIZUMAB 2.5 MG/0.1ML IZ SOSY
2.5000 mg | PREFILLED_SYRINGE | INTRAVITREAL | Status: AC | PRN
  Administered 2022-05-19: 2.5 mg via INTRAVITREAL

## 2022-05-19 NOTE — Assessment & Plan Note (Signed)
The nature of diabetic macular edema was discussed with the patient. Treatment options were outlined including medical therapy, laser & vitrectomy. The use of injectable medications reviewed, including Avastin, Lucentis, and Eylea. Periodic injections into the eye are likely to resolve diabetic macular edema (swelling in the center of vision). Initially, injections are delivered are delivered every 4-6 weeks, and the interval extended as the condition improves. On average, 8-9 injections the first year, and 5 in year 2. Improvement in the condition most often improves on medical therapy. Occasional use of focal laser is also recommended for residual macular edema (swelling). Excellent control of blood glucose and blood pressure are encouraged under the care of a primary physician or endocrinologist. Similarly, attempts to maintain serum cholesterol, low density lipoproteins, and high-density lipoproteins in a favorable range were recommended.   Sent involved CSME OD recurrence at 10-week follow-up and will need repeat injection today disclosed with patient reviewed patient would like to proceed

## 2022-05-19 NOTE — Assessment & Plan Note (Signed)
OU appears physiologic

## 2022-05-19 NOTE — Assessment & Plan Note (Signed)
At 5 weeks postinjection day doing very well sent involved CSME still active repeat again today

## 2022-05-19 NOTE — Progress Notes (Signed)
05/19/2022     CHIEF COMPLAINT Patient presents for  Chief Complaint  Patient presents with   Diabetic Retinopathy with Macular Edema      HISTORY OF PRESENT ILLNESS: Sara Graves is a 81 y.o. female who presents to the clinic today for:   HPI   5 weeks for DILATE OS, AVASTIN OCT.  And OD, 10 weeks post most recent inj antiVEGF Pt stated vision has been stable since last visit.  Last edited by Hurman Horn, MD on 05/19/2022  2:17 PM.      Referring physician: [name], PA-C [place],  [place] [id number]  HISTORICAL INFORMATION:   Selected notes from the MEDICAL RECORD NUMBER    Lab Results  Component Value Date   HGBA1C 9.8 (H) 02/12/2021     CURRENT MEDICATIONS: No current outpatient medications on file. (Ophthalmic Drugs)   No current facility-administered medications for this visit. (Ophthalmic Drugs)   Current Outpatient Medications (Other)  Medication Sig   alendronate (FOSAMAX) 70 MG tablet Take 1 tablet (70 mg total) by mouth once a week.   aspirin EC 81 MG tablet Take 81 mg by mouth daily.   busPIRone (BUSPAR) 5 MG tablet Take 5 mg by mouth 2 (two) times daily. As needed   Calcium Carb-Cholecalciferol (CALCIUM 600 + D PO) Take by mouth.   dapagliflozin propanediol (FARXIGA) 10 MG TABS tablet Take 1 tablet (10 mg total) by mouth daily.   ezetimibe (ZETIA) 10 MG tablet Take 10 mg by mouth daily.   gabapentin (NEURONTIN) 100 MG capsule Take 100 mg by mouth at bedtime.   glucose blood test strip 1 each 3 (three) times daily.    insulin aspart (NOVOLOG FLEXPEN) 100 UNIT/ML FlexPen Inject into the skin.   insulin degludec (TRESIBA) 100 UNIT/ML FlexTouch Pen Inject into the skin.   isosorbide dinitrate (ISORDIL) 40 MG tablet TAKE 1 TABLET BY MOUTH 3 TIMES DAILY.   levothyroxine (SYNTHROID, LEVOTHROID) 50 MCG tablet Take 50 mcg by mouth daily before breakfast.    Magnesium Oxide 500 MG TABS TAKE 1 TABLET (500 MG TOTAL) BY MOUTH IN THE  MORNING AND AT BEDTIME. (Patient taking differently: Take 500 mg by mouth in the morning, at noon, and at bedtime.)   methocarbamol (ROBAXIN) 500 MG tablet Take 500 mg by mouth 4 (four) times daily.   metoprolol succinate (TOPROL-XL) 50 MG 24 hr tablet TAKE 1 TABLET BY MOUTH EVERY DAY WITH OR IMMEDIATELY FOLLOWING A MEAL   nitroGLYCERIN (NITROSTAT) 0.4 MG SL tablet PLACE 1 TABLET UNDER THE TONGUE EVERY 5 MINUTES X 3 DOSES AS NEEDED FOR CHEST PAIN. (Patient taking differently: Place 0.4 mg under the tongue every 5 (five) minutes as needed for chest pain.)   ONETOUCH VERIO test strip 1 each by Other route 3 (three) times daily.   rosuvastatin (CRESTOR) 40 MG tablet Take 1 tablet (40 mg total) by mouth at bedtime.   sacubitril-valsartan (ENTRESTO) 97-103 MG Take 1 tablet by mouth 2 (two) times daily.   torsemide (DEMADEX) 20 MG tablet Take 20 mg by mouth every other day.   vitamin B-12 (CYANOCOBALAMIN) 1000 MCG tablet Take 1,000 mcg by mouth daily.    Vitamin D, Ergocalciferol, (DRISDOL) 50000 units CAPS capsule Take 50,000 Units by mouth every 7 (seven) days. Take on Thursdays   No current facility-administered medications for this visit. (Other)      REVIEW OF SYSTEMS: ROS   Negative for: Constitutional, Gastrointestinal, Neurological, Skin, Genitourinary, Musculoskeletal,  HENT, Endocrine, Cardiovascular, Eyes, Respiratory, Psychiatric, Allergic/Imm, Heme/Lymph Last edited by Silvestre Moment on 05/19/2022  1:50 PM.       ALLERGIES Allergies  Allergen Reactions   Rosiglitazone Maleate Anaphylaxis and Swelling   Morphine Other (See Comments)    SEVERE HYPOTENSION    Tramadol Other (See Comments)    Unknown reaction   Cephalexin Diarrhea   Elavil [Amitriptyline] Nausea Only   Sulfa Antibiotics Diarrhea and Nausea And Vomiting   Tramadol-Acetaminophen Rash    PAST MEDICAL HISTORY Past Medical History:  Diagnosis Date   Acute combined systolic and diastolic heart failure ([place]) 04/25/2018    Anxiety    Arthritis    "hands" (12/29/2017)   Basal cell carcinoma of nose    removed   Bursitis of left shoulder    BV ICD [employer] [place] MRI CRTD 06/21/2019   Cat scratch fever    Late 90s   Chronic combined systolic and diastolic CHF (congestive heart failure) ([place]) 06/08/2019   Coronary artery disease    Depression    Diabetes mellitus, type II, insulin dependent ([place])    Encounter for assessment of implantable cardioverter-defibrillator (ICD) 09/28/2019   GERD (gastroesophageal reflux disease)    H. pylori infection 2008 and 1998   treated   Hyperlipidemia    Hypertension    Hypothyroidism    Low oxygen saturation    Macular degeneration    Multinodular goiter    Rotator cuff tear, left recurrent    Urge urinary incontinence    UTI (lower urinary tract infection) 05/2016   Past Surgical History:  Procedure Laterality Date   APPENDECTOMY  AGE 39   BACK SURGERY     BASAL CELL CARCINOMA EXCISION     "nose"   BIV ICD INSERTION CRT-D N/A 06/21/2019   Procedure: BIV ICD INSERTION CRT-D;  Surgeon: Evans Lance, MD;  Location: [facility];  Service: Cardiovascular;  Laterality: N/A;   BLADDER NECK SUSPENSION  1970's   BUNIONECTOMY WITH HAMMERTOE RECONSTRUCTION Bilateral    CARPAL TUNNEL RELEASE Right 05/2017   COLONOSCOPY W/ POLYPECTOMY     CORONARY ANGIOPLASTY WITH STENT PLACEMENT  12/29/2017   CORONARY BALLOON ANGIOPLASTY N/A 02/08/2019   Procedure: CORONARY BALLOON ANGIOPLASTY;  Surgeon: Adrian Prows, MD;  Location: [facility];  Service: Cardiovascular;  Laterality: N/A;   CORONARY STENT INTERVENTION N/A 12/29/2017   Procedure: CORONARY STENT INTERVENTION;  Surgeon: Nigel Mormon, MD;  Location: [facility];  Service: Cardiovascular;  Laterality: N/A;   CORONARY STENT INTERVENTION N/A 04/26/2018   Procedure: CORONARY STENT INTERVENTION;  Surgeon: Nigel Mormon, MD;  Location: [facility];  Service: Cardiovascular;  Laterality: N/A;    CORONARY STENT INTERVENTION N/A 02/08/2019   Procedure: CORONARY STENT INTERVENTION;  Surgeon: Adrian Prows, MD;  Location: [facility];  Service: Cardiovascular;  Laterality: N/A;   ESOPHAGOGASTRODUODENOSCOPY ENDOSCOPY     FORAMINAL DECOMPRESSION AT L2 TO THE SACRUM  01-05-2008   L2  -  S1   GANGLION CYST EXCISION Left 01/17/2009   ring finger   IMPLANTATION PERMANENT SPINAL CORD STIMULATOR  06-15-2008   JUNE 2013--  BATTERY CHANGE   JOINT REPLACEMENT     LEFT HEART CATH AND CORONARY ANGIOGRAPHY N/A 04/26/2018   Procedure: LEFT HEART CATH AND CORONARY ANGIOGRAPHY;  Surgeon: Nigel Mormon, MD;  Location: [facility];  Service: Cardiovascular;  Laterality: N/A;   LEFT HEART CATH AND CORONARY ANGIOGRAPHY N/A 02/08/2019   Procedure: LEFT  HEART CATH AND CORONARY ANGIOGRAPHY;  Surgeon: Adrian Prows, MD;  Location: [facility];  Service: Cardiovascular;  Laterality: N/A;   LIPOMA EXCISION Right    RIGHT ELBOW   METATARSAL HEAD EXCISION Right 06/16/2018   Procedure: right 5th metatarsal excision, a mini c-arm;  Surgeon: Trula Slade, DPM;  Location: [facility];  Service: Podiatry;  Laterality: Right;  anesthesia can do block   RIGHT/LEFT HEART CATH AND CORONARY ANGIOGRAPHY N/A 12/29/2017   Procedure: RIGHT/LEFT HEART CATH AND CORONARY ANGIOGRAPHY;  Surgeon: Nigel Mormon, MD;  Location: [facility];  Service: Cardiovascular;  Laterality: N/A;   SHOULDER OPEN ROTATOR CUFF REPAIR  09/07/2012   Procedure: ROTATOR CUFF REPAIR SHOULDER OPEN;  Surgeon: Magnus Sinning, MD;  Location: [facility];  Service: Orthopedics;  Laterality: Left;  OPEN ANTERIOR ACROMIONECTOMY AND ROTATOR CUFF REPAIR ON LEFT    SHOULDER OPEN ROTATOR CUFF REPAIR Left 12/28/2012   Procedure: ROTATOR CUFF REPAIR SHOULDER OPEN;  Surgeon: Magnus Sinning, MD;  Location: [facility];  Service: Orthopedics;  Laterality: Left;  OPEN SHOULDER ROTATOR CUFF REPAIR ON LEFT   WITH ANTERIOR ACROMINECTOMY    SHOULDER OPEN ROTATOR CUFF REPAIR Right 03/28/2003   RIGHT SHOULDER  DEGENERATIVE AC JOINT AND RC TEAR   SPINAL CORD STIMULATOR BATTERY EXCHANGE N/A 07/03/2016   Procedure: SPINAL CORD STIMULATOR BATTERY PLACMENT;  Surgeon: Melina Schools, MD;  Location: [facility];  Service: Orthopedics;  Laterality: N/A;   TONSILLECTOMY  AGE 58   TOTAL KNEE ARTHROPLASTY Right 05/06/2000   OA RIGHT KNEE   VAGINAL HYSTERECTOMY  1970's    FAMILY HISTORY Family History  Problem Relation Age of Onset   Cancer Mother        breast   Heart Problems Mother    Prostate cancer Father    Muscular dystrophy Son    Lung cancer Son    Cancer Maternal Grandmother        breast   Heart disease Maternal Grandfather    Breast cancer Sister     SOCIAL HISTORY Social History   Tobacco Use   Smoking status: Never   Smokeless tobacco: Never  Vaping Use   Vaping Use: Never used  Substance Use Topics   Alcohol use: No   Drug use: No         OPHTHALMIC EXAM:  Base Eye Exam     Visual Acuity (ETDRS)       Right Left   Dist [place] 20/70 -1 20/25 -1   Dist ph [place] 20/60 -1          Tonometry (Tonopen, 1:56 PM)       Right Left   Pressure 9 13         Pupils       Pupils APD   Right PERRL None   Left PERRL None         Visual Fields       Left Right    Full Full         Extraocular Movement       Right Left    Full Full         Neuro/Psych     Oriented x3: Yes   Mood/Affect: Normal         Dilation     Both eyes: 2.5% Phenylephrine, 1.0% Mydriacyl @ 1:56 PM           Slit Lamp and Fundus Exam  External Exam       Right Left   External Normal Normal         Slit Lamp Exam       Right Left   Lids/Lashes Normal Normal   Conjunctiva/Sclera White and quiet White and quiet   Cornea Clear Clear   Anterior Chamber Deep and quiet Deep and quiet   Iris Round and reactive Round and reactive   Lens Posterior chamber intraocular  lens Posterior chamber intraocular lens   Anterior Vitreous Normal Normal         Fundus Exam       Right Left   Posterior Vitreous Normal Normal   Disc Normal Normal   C/D Ratio 0.3 0.3   Macula Microaneurysms, Macular thickening, Moderate clinically significant macular edema, center involved Microaneurysms, Macular thickening, Moderate clinically significant macular edema, superotemporal to FAZ   Vessels NPDR-Severe, with NVE INF NASAL TO NERVE NPDR-Severe   Periphery PRP 270, needs PRP completion superiorly OD Normal            IMAGING AND PROCEDURES  Imaging and Procedures for 05/19/22  OCT, Retina - OU - Both Eyes       Right Eye Quality was good. Scan locations included subfoveal. Central Foveal Thickness: 532. Progression has worsened. Findings include abnormal foveal contour.   Left Eye Quality was good. Scan locations included extrafoveal. Central Foveal Thickness: 321. Progression has improved. Findings include abnormal foveal contour, vitreomacular adhesion .   Notes Center involved CSME OD, worse today at interval of follow-up of' \\0'$  weeks will need repeat injection today and reevaluate right in 5 weeks OS also center involved CSME improving Yet needs repeat dilation as scheduled much improved at 5 week today post recent injection, will repeat injection OS today as well      Intravitreal Injection, Pharmacologic Agent - OS - Left Eye       Time Out 05/19/2022. 2:22 PM. Confirmed correct patient, procedure, site, and patient consented.   Anesthesia Topical anesthesia was used. Anesthetic medications included Lidocaine 4%.   Procedure Preparation included 5% betadine to ocular surface, 10% betadine to eyelids, Tobramycin 0.3%, Ofloxacin . A 30 gauge needle was used.   Injection: 2.5 mg bevacizumab 2.5 MG/0.1ML   Route: Intravitreal, Site: Left Eye   NDC: [phone], Lot: [id number], Expiration date: 07/18/2022   Post-op Post injection exam found visual  acuity of at least counting fingers. The patient tolerated the procedure well. There were no complications. The patient received written and verbal post procedure care education. Post injection medications included ocuflox.      Intravitreal Injection, Pharmacologic Agent - OD - Right Eye       Time Out 05/19/2022. 2:22 PM. Confirmed correct patient, procedure, site, and patient consented.   Anesthesia Topical anesthesia was used. Anesthetic medications included Lidocaine 4%.   Procedure Preparation included 5% betadine to ocular surface, 10% betadine to eyelids, Tobramycin 0.3%. A 30 gauge needle was used.   Injection: 2.5 mg bevacizumab 2.5 MG/0.1ML   Route: Intravitreal, Site: Right Eye   NDC: [id number], Lot: [id number], Expiration date: 07/25/2022   Post-op Post injection exam found visual acuity of at least counting fingers. The patient tolerated the procedure well. There were no complications. The patient received written and verbal post procedure care education. Post injection medications included ocuflox.              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 Treatment options were outlined including medical therapy, laser & vitrectomy. The use of injectable medications reviewed, including Avastin, Lucentis, and Eylea. Periodic injections into the eye are likely to resolve diabetic macular edema (swelling in the center of vision). Initially, injections are delivered are delivered every 4-6 weeks, and the interval extended as the condition improves. On average, 8-9 injections the first year, and 5 in year 2. Improvement in the condition most often improves on medical therapy. Occasional use of focal laser is also recommended for residual macular edema (swelling). Excellent control of blood glucose and blood pressure are  encouraged under the care of a primary physician or endocrinologist. Similarly, attempts to maintain serum cholesterol, low density lipoproteins, and high-density lipoproteins in a favorable range were recommended.   Sent involved CSME OD recurrence at 10-week follow-up and will need repeat injection today disclosed with patient reviewed patient would like to proceed  Severe nonproliferative diabetic retinopathy of left eye, with macular edema, associated with type 2 diabetes mellitus ([place]) At 5 weeks postinjection day doing very well sent involved CSME still active repeat again today  Vitreomacular adhesion of both eyes OU appears physiologic     ICD-10-CM   1. Severe nonproliferative diabetic retinopathy of left eye, with macular edema, associated with type 2 diabetes mellitus ([facility])  L38.1017 OCT, Retina - OU - Both Eyes    Intravitreal Injection, Pharmacologic Agent - OS - Left Eye    bevacizumab (AVASTIN) SOSY 2.5 mg    2. Severe nonproliferative diabetic retinopathy of right eye, with macular edema, associated with type 2 diabetes mellitus ([facility])  E11.3411 Intravitreal Injection, Pharmacologic Agent - OD - Right Eye    bevacizumab (AVASTIN) SOSY 2.5 mg    3. 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 Vitreomacular adhesion of both eyes  H43.823       OS with center involved CSME much improved at 5-week interval  2.  OD follow-up today at 10 weeks post most recent injection and center involved CSME has worsened.  After discussion patient would also like to proceed with treatment of the right eye today we will deliver intravitreal Avastin OU today  3.  Follow-up OU in 5 weeks dilate OU likely Avastin OCT OU  Ophthalmic Meds Ordered this visit:  Meds ordered this encounter  Medications   bevacizumab (AVASTIN) SOSY 2.5 mg   bevacizumab (AVASTIN) SOSY 2.5 mg       Return in about 5 weeks  (around 06/23/2022) for DILATE OU, AVASTIN OCT, OD, OS.  There are no Patient Instructions on file for this visit.   Explained the diagnoses, plan, and follow up with the patient and they expressed understanding.  Patient expressed understanding of the importance of proper follow up care.   Clent Demark Kashawn Dirr M.D. Diseases & Surgery of the Retina and Vitreous Retina & Diabetic [place] 05/19/22     Abbreviations: M myopia (nearsighted); A astigmatism; H hyperopia (farsighted); P presbyopia; Mrx spectacle prescription;  CTL contact lenses; OD right eye; OS left eye; OU both eyes  XT exotropia; ET esotropia; PEK punctate epithelial keratitis; PEE punctate epithelial erosions; DES dry eye syndrome; MGD meibomian gland dysfunction; ATs artificial tears; PFAT's preservative free artificial tears; [place] nuclear sclerotic cataract; PSC posterior subcapsular cataract; ERM epi-retinal membrane; PVD posterior vitreous detachment; RD retinal detachment; DM diabetes mellitus; DR diabetic  retinopathy; NPDR non-proliferative diabetic retinopathy; PDR proliferative diabetic retinopathy; CSME clinically significant macular edema; DME diabetic macular edema; dbh dot blot hemorrhages; CWS cotton wool spot; POAG primary open angle glaucoma; C/D cup-to-disc ratio; HVF humphrey visual field; GVF goldmann visual field; OCT optical coherence tomography; IOP intraocular pressure; BRVO Branch retinal vein occlusion; CRVO central retinal vein occlusion; CRAO central retinal artery occlusion; BRAO branch retinal artery occlusion; RT retinal tear; SB scleral buckle; PPV pars plana vitrectomy; VH Vitreous hemorrhage; PRP panretinal laser photocoagulation; IVK intravitreal kenalog; VMT vitreomacular traction; MH Macular hole;  NVD neovascularization of the disc; NVE neovascularization elsewhere; AREDS age related eye disease study; ARMD age related macular degeneration; POAG primary open angle glaucoma; EBMD epithelial/anterior  basement membrane dystrophy; ACIOL anterior chamber intraocular lens; IOL intraocular lens; PCIOL posterior chamber intraocular lens; Phaco/IOL phacoemulsification with intraocular lens placement; [place] photorefractive keratectomy; LASIK laser assisted in situ keratomileusis; HTN hypertension; DM diabetes mellitus; COPD chronic obstructive pulmonary disease

## 2022-06-23 ENCOUNTER — Ambulatory Visit (INDEPENDENT_AMBULATORY_CARE_PROVIDER_SITE_OTHER): Payer: Medicare PPO | Admitting: Ophthalmology

## 2022-06-23 ENCOUNTER — Encounter (INDEPENDENT_AMBULATORY_CARE_PROVIDER_SITE_OTHER): Payer: Self-pay | Admitting: Ophthalmology

## 2022-06-23 DIAGNOSIS — Z794 Long term (current) use of insulin: Secondary | ICD-10-CM

## 2022-06-23 DIAGNOSIS — Z961 Presence of intraocular lens: Secondary | ICD-10-CM | POA: Diagnosis not present

## 2022-06-23 DIAGNOSIS — H43823 Vitreomacular adhesion, bilateral: Secondary | ICD-10-CM

## 2022-06-23 DIAGNOSIS — E113511 Type 2 diabetes mellitus with proliferative diabetic retinopathy with macular edema, right eye: Secondary | ICD-10-CM

## 2022-06-23 DIAGNOSIS — E113412 Type 2 diabetes mellitus with severe nonproliferative diabetic retinopathy with macular edema, left eye: Secondary | ICD-10-CM

## 2022-06-23 DIAGNOSIS — E113413 Type 2 diabetes mellitus with severe nonproliferative diabetic retinopathy with macular edema, bilateral: Secondary | ICD-10-CM

## 2022-06-23 DIAGNOSIS — E113411 Type 2 diabetes mellitus with severe nonproliferative diabetic retinopathy with macular edema, right eye: Secondary | ICD-10-CM | POA: Diagnosis not present

## 2022-06-23 MED ORDER — BEVACIZUMAB CHEMO INJECTION 1.25MG/0.05ML SYRINGE FOR KALEIDOSCOPE
1.2500 mg | INTRAVITREAL | Status: AC | PRN
  Administered 2022-06-23: 1.25 mg via INTRAVITREAL

## 2022-06-23 NOTE — Assessment & Plan Note (Signed)
The nature of diabetic macular edema was discussed with the patient. Treatment options were outlined including medical therapy, laser & vitrectomy. The use of injectable medications reviewed, including Avastin, Lucentis, and Eylea. Periodic injections into the eye are likely to resolve diabetic macular edema (swelling in the center of vision). Initially, injections are delivered are delivered every 4-6 weeks, and the interval extended as the condition improves. On average, 8-9 injections the first year, and 5 in year 2. Improvement in the condition most often improves on medical therapy. Occasional use of focal laser is also recommended for residual macular edema (swelling). Excellent control of blood glucose and blood pressure are encouraged under the care of a primary physician or endocrinologist. Similarly, attempts to maintain serum cholesterol, low density lipoproteins, and high-density lipoproteins in a favorable range were recommended.   OS will need repeat injection today at 5 weeks.  Maintain visual function.

## 2022-06-23 NOTE — Assessment & Plan Note (Signed)
Center involved CSME improved overall yet still very active

## 2022-06-23 NOTE — Progress Notes (Signed)
06/23/2022     CHIEF COMPLAINT Patient presents for  Chief Complaint  Patient presents with   Diabetic Retinopathy with Macular Edema      HISTORY OF PRESENT ILLNESS: Sara Graves is a 81 y.o. female who presents to the clinic today for:   HPI   5 weeks for DILATE OU, AVASTIN OCT, OD, OS. Pt stated vision has remained stable since last visit.  Last edited by Silvestre Moment on 06/23/2022  2:10 PM.      Referring physician: Debbra Riding, MD [address],  [place] [address]  HISTORICAL INFORMATION:   Selected notes from the MEDICAL RECORD NUMBER    Lab Results  Component Value Date   HGBA1C 9.8 (H) 02/12/2021     CURRENT MEDICATIONS: No current outpatient medications on file. (Ophthalmic Drugs)   No current facility-administered medications for this visit. (Ophthalmic Drugs)   Current Outpatient Medications (Other)  Medication Sig   alendronate (FOSAMAX) 70 MG tablet Take 1 tablet (70 mg total) by mouth once a week.   aspirin EC 81 MG tablet Take 81 mg by mouth daily.   busPIRone (BUSPAR) 5 MG tablet Take 5 mg by mouth 2 (two) times daily. As needed   Calcium Carb-Cholecalciferol (CALCIUM 600 + D PO) Take by mouth.   dapagliflozin propanediol (FARXIGA) 10 MG TABS tablet Take 1 tablet (10 mg total) by mouth daily.   ezetimibe (ZETIA) 10 MG tablet Take 10 mg by mouth daily.   gabapentin (NEURONTIN) 100 MG capsule Take 100 mg by mouth at bedtime.   glucose blood test strip 1 each 3 (three) times daily.    insulin aspart (NOVOLOG FLEXPEN) 100 UNIT/ML FlexPen Inject into the skin.   insulin degludec (TRESIBA) 100 UNIT/ML FlexTouch Pen Inject into the skin.   isosorbide dinitrate (ISORDIL) 40 MG tablet TAKE 1 TABLET BY MOUTH 3 TIMES DAILY.   levothyroxine (SYNTHROID, LEVOTHROID) 50 MCG tablet Take 50 mcg by mouth daily before breakfast.    Magnesium Oxide 500 MG TABS TAKE 1 TABLET (500 MG TOTAL) BY MOUTH IN THE MORNING AND AT BEDTIME. (Patient taking  differently: Take 500 mg by mouth in the morning, at noon, and at bedtime.)   methocarbamol (ROBAXIN) 500 MG tablet Take 500 mg by mouth 4 (four) times daily.   metoprolol succinate (TOPROL-XL) 50 MG 24 hr tablet TAKE 1 TABLET BY MOUTH EVERY DAY WITH OR IMMEDIATELY FOLLOWING A MEAL   nitroGLYCERIN (NITROSTAT) 0.4 MG SL tablet PLACE 1 TABLET UNDER THE TONGUE EVERY 5 MINUTES X 3 DOSES AS NEEDED FOR CHEST PAIN. (Patient taking differently: Place 0.4 mg under the tongue every 5 (five) minutes as needed for chest pain.)   ONETOUCH VERIO test strip 1 each by Other route 3 (three) times daily.   rosuvastatin (CRESTOR) 40 MG tablet Take 1 tablet (40 mg total) by mouth at bedtime.   sacubitril-valsartan (ENTRESTO) 97-103 MG Take 1 tablet by mouth 2 (two) times daily.   torsemide (DEMADEX) 20 MG tablet Take 20 mg by mouth every other day.   vitamin B-12 (CYANOCOBALAMIN) 1000 MCG tablet Take 1,000 mcg by mouth daily.    Vitamin D, Ergocalciferol, (DRISDOL) 50000 units CAPS capsule Take 50,000 Units by mouth every 7 (seven) days. Take on Thursdays   No current facility-administered medications for this visit. (Other)      REVIEW OF SYSTEMS: ROS   Negative for: Constitutional, Gastrointestinal, Neurological, Skin, Genitourinary, Musculoskeletal, HENT, Endocrine, Cardiovascular, Eyes, Respiratory, Psychiatric, Allergic/Imm, Heme/Lymph  Last edited by Silvestre Moment on 06/23/2022  2:10 PM.       ALLERGIES Allergies  Allergen Reactions   Rosiglitazone Maleate Anaphylaxis and Swelling   Morphine Other (See Comments)    SEVERE HYPOTENSION    Tramadol Other (See Comments)    Unknown reaction   Cephalexin Diarrhea   Elavil [Amitriptyline] Nausea Only   Sulfa Antibiotics Diarrhea and Nausea And Vomiting   Tramadol-Acetaminophen Rash    PAST MEDICAL HISTORY Past Medical History:  Diagnosis Date   Acute combined systolic and diastolic heart failure ([place]) 04/25/2018   Anxiety    Arthritis    "hands"  (12/29/2017)   Basal cell carcinoma of nose    removed   Bursitis of left shoulder    BV ICD [employer] [place] MRI CRTD 06/21/2019   Cat scratch fever    Late 90s   Cataract, nuclear sclerotic, both eyes 05/13/2021   Chronic combined systolic and diastolic CHF (congestive heart failure) ([place]) 06/08/2019   Coronary artery disease    Depression    Diabetes mellitus, type II, insulin dependent ([place])    Encounter for assessment of implantable cardioverter-defibrillator (ICD) 09/28/2019   GERD (gastroesophageal reflux disease)    H. pylori infection 2008 and 1998   treated   Hyperlipidemia    Hypertension    Hypothyroidism    Low oxygen saturation    Macular degeneration    Multinodular goiter    Rotator cuff tear, left recurrent    Urge urinary incontinence    UTI (lower urinary tract infection) 05/2016   Past Surgical History:  Procedure Laterality Date   APPENDECTOMY  AGE 68   BACK SURGERY     BASAL CELL CARCINOMA EXCISION     "nose"   BIV ICD INSERTION CRT-D N/A 06/21/2019   Procedure: BIV ICD INSERTION CRT-D;  Surgeon: Evans Lance, MD;  Location: [facility];  Service: Cardiovascular;  Laterality: N/A;   BLADDER NECK SUSPENSION  1970's   BUNIONECTOMY WITH HAMMERTOE RECONSTRUCTION Bilateral    CARPAL TUNNEL RELEASE Right 05/2017   COLONOSCOPY W/ POLYPECTOMY     CORONARY ANGIOPLASTY WITH STENT PLACEMENT  12/29/2017   CORONARY BALLOON ANGIOPLASTY N/A 02/08/2019   Procedure: CORONARY BALLOON ANGIOPLASTY;  Surgeon: Adrian Prows, MD;  Location: [facility];  Service: Cardiovascular;  Laterality: N/A;   CORONARY STENT INTERVENTION N/A 12/29/2017   Procedure: CORONARY STENT INTERVENTION;  Surgeon: Nigel Mormon, MD;  Location: [facility];  Service: Cardiovascular;  Laterality: N/A;   CORONARY STENT INTERVENTION N/A 04/26/2018   Procedure: CORONARY STENT INTERVENTION;  Surgeon: Nigel Mormon, MD;  Location: [facility];  Service: Cardiovascular;   Laterality: N/A;   CORONARY STENT INTERVENTION N/A 02/08/2019   Procedure: CORONARY STENT INTERVENTION;  Surgeon: Adrian Prows, MD;  Location: [facility];  Service: Cardiovascular;  Laterality: N/A;   ESOPHAGOGASTRODUODENOSCOPY ENDOSCOPY     FORAMINAL DECOMPRESSION AT L2 TO THE SACRUM  01-05-2008   L2  -  S1   GANGLION CYST EXCISION Left 01/17/2009   ring finger   IMPLANTATION PERMANENT SPINAL CORD STIMULATOR  06-15-2008   JUNE 2013--  BATTERY CHANGE   JOINT REPLACEMENT     LEFT HEART CATH AND CORONARY ANGIOGRAPHY N/A 04/26/2018   Procedure: LEFT HEART CATH AND CORONARY ANGIOGRAPHY;  Surgeon: Nigel Mormon, MD;  Location: [facility];  Service: Cardiovascular;  Laterality: N/A;   LEFT HEART CATH AND CORONARY ANGIOGRAPHY N/A 02/08/2019   Procedure: LEFT  HEART CATH AND CORONARY ANGIOGRAPHY;  Surgeon: Adrian Prows, MD;  Location: [facility];  Service: Cardiovascular;  Laterality: N/A;   LIPOMA EXCISION Right    RIGHT ELBOW   METATARSAL HEAD EXCISION Right 06/16/2018   Procedure: right 5th metatarsal excision, a mini c-arm;  Surgeon: Trula Slade, DPM;  Location: [facility];  Service: Podiatry;  Laterality: Right;  anesthesia can do block   RIGHT/LEFT HEART CATH AND CORONARY ANGIOGRAPHY N/A 12/29/2017   Procedure: RIGHT/LEFT HEART CATH AND CORONARY ANGIOGRAPHY;  Surgeon: Nigel Mormon, MD;  Location: [facility];  Service: Cardiovascular;  Laterality: N/A;   SHOULDER OPEN ROTATOR CUFF REPAIR  09/07/2012   Procedure: ROTATOR CUFF REPAIR SHOULDER OPEN;  Surgeon: Magnus Sinning, MD;  Location: [facility];  Service: Orthopedics;  Laterality: Left;  OPEN ANTERIOR ACROMIONECTOMY AND ROTATOR CUFF REPAIR ON LEFT    SHOULDER OPEN ROTATOR CUFF REPAIR Left 12/28/2012   Procedure: ROTATOR CUFF REPAIR SHOULDER OPEN;  Surgeon: Magnus Sinning, MD;  Location: [facility];  Service: Orthopedics;  Laterality: Left;  OPEN SHOULDER ROTATOR  CUFF REPAIR ON LEFT  WITH ANTERIOR ACROMINECTOMY    SHOULDER OPEN ROTATOR CUFF REPAIR Right 03/28/2003   RIGHT SHOULDER  DEGENERATIVE AC JOINT AND RC TEAR   SPINAL CORD STIMULATOR BATTERY EXCHANGE N/A 07/03/2016   Procedure: SPINAL CORD STIMULATOR BATTERY PLACMENT;  Surgeon: Melina Schools, MD;  Location: [facility];  Service: Orthopedics;  Laterality: N/A;   TONSILLECTOMY  AGE 21   TOTAL KNEE ARTHROPLASTY Right 05/06/2000   OA RIGHT KNEE   VAGINAL HYSTERECTOMY  1970's    FAMILY HISTORY Family History  Problem Relation Age of Onset   Cancer Mother        breast   Heart Problems Mother    Prostate cancer Father    Muscular dystrophy Son    Lung cancer Son    Cancer Maternal Grandmother        breast   Heart disease Maternal Grandfather    Breast cancer Sister     SOCIAL HISTORY Social History   Tobacco Use   Smoking status: Never   Smokeless tobacco: Never  Vaping Use   Vaping Use: Never used  Substance Use Topics   Alcohol use: No   Drug use: No         OPHTHALMIC EXAM:  Base Eye Exam     Visual Acuity (ETDRS)       Right Left   Dist [place] 20/40 -2 20/30 -1   Dist ph [place] NI 20/25 -2         Tonometry (Tonopen, 2:15 PM)       Right Left   Pressure 10 10         Pupils       Pupils APD   Right PERRL None   Left PERRL None         Visual Fields       Left Right    Full Full         Extraocular Movement       Right Left    Full, Ortho Full, Ortho         Neuro/Psych     Oriented x3: Yes   Mood/Affect: Normal         Dilation     Both eyes: 1.0% Mydriacyl, 2.5% Phenylephrine @ 2:15 PM           Slit Lamp and Fundus Exam  External Exam       Right Left   External Normal Normal         Slit Lamp Exam       Right Left   Lids/Lashes Normal Normal   Conjunctiva/Sclera White and quiet White and quiet   Cornea Clear Clear   Anterior Chamber Deep and quiet Deep and quiet   Iris Round and reactive Round and reactive    Lens Posterior chamber intraocular lens Posterior chamber intraocular lens   Anterior Vitreous Normal Normal         Fundus Exam       Right Left   Posterior Vitreous Normal Normal   Disc Normal Normal   C/D Ratio 0.3 0.3   Macula Microaneurysms, less macular thickening, Moderate clinically significant macular edema, center involved Microaneurysms, Macular thickening, Moderate clinically significant macular edema, superotemporal to FAZ   Vessels NPDR-Severe, with NVE INF NASAL TO NERVE NPDR-Severe   Periphery PRP 270, needs PRP completion superiorly OD Normal            IMAGING AND PROCEDURES  Imaging and Procedures for 06/23/22  OCT, Retina - OU - Both Eyes       Right Eye Quality was good. Scan locations included subfoveal. Central Foveal Thickness: 465. Progression has improved. Findings include abnormal foveal contour.   Left Eye Quality was good. Scan locations included extrafoveal. Central Foveal Thickness: 323. Progression has been stable. Findings include abnormal foveal contour, vitreomacular adhesion .   Notes Center involved CSME OD, improved today at interval of follow-up of 5 weeks will need repeat injection today and reevaluate right in 5 weeks OS also center involved CSME improving Yet needs repeat dilation as scheduled much improved at 5 week today post recent injection, will repeat injection OS today as well      Intravitreal Injection, Pharmacologic Agent - OD - Right Eye       Time Out 06/23/2022. 2:43 PM. Confirmed correct patient, procedure, site, and patient consented.   Anesthesia Topical anesthesia was used. Anesthetic medications included Lidocaine 4%.   Procedure Preparation included 5% betadine to ocular surface, 10% betadine to eyelids, Tobramycin 0.3%. A 30 gauge needle was used.   Injection: 1.25 mg Bevacizumab 1.'25mg'$ /0.5m   Route: Intravitreal, Site: Right Eye   NDC: 5H061816 Lot: [id number] Expiration date: 09/13/2022    Post-op Post injection exam found visual acuity of at least counting fingers. The patient tolerated the procedure well. There were no complications. The patient received written and verbal post procedure care education. Post injection medications included ocuflox.      Intravitreal Injection, Pharmacologic Agent - OS - Left Eye       Time Out 06/23/2022. 2:43 PM. Confirmed correct patient, procedure, site, and patient consented.   Anesthesia Topical anesthesia was used. Anesthetic medications included Lidocaine 4%.   Procedure Preparation included 5% betadine to ocular surface, 10% betadine to eyelids, Tobramycin 0.3%, Ofloxacin . A 30 gauge needle was used.   Injection: 1.25 mg Bevacizumab 1.'25mg'$ /0.023m  Route: Intravitreal, Site: Left Eye   NDC: 50H061816Lot: [id number] date: 09/13/2022   Post-op Post injection exam found visual acuity of at least counting fingers. The patient tolerated the procedure well. There were no complications. The patient received written and verbal post procedure care education. Post injection medications included ocuflox.              ASSESSMENT/PLAN:  Severe nonproliferative diabetic retinopathy of left eye, with macular edema, associated with type 2 diabetes  mellitus ([place])  The nature of diabetic macular edema was discussed with the patient. Treatment options were outlined including medical therapy, laser & vitrectomy. The use of injectable medications reviewed, including Avastin, Lucentis, and Eylea. Periodic injections into the eye are likely to resolve diabetic macular edema (swelling in the center of vision). Initially, injections are delivered are delivered every 4-6 weeks, and the interval extended as the condition improves. On average, 8-9 injections the first year, and 5 in year 2. Improvement in the condition most often improves on medical therapy. Occasional use of focal laser is also recommended for residual macular edema  (swelling). Excellent control of blood glucose and blood pressure are encouraged under the care of a primary physician or endocrinologist. Similarly, attempts to maintain serum cholesterol, low density lipoproteins, and high-density lipoproteins in a favorable range were recommended.   OS will need repeat injection today at 5 weeks.  Maintain visual function.  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 Severe nonproliferative diabetic retinopathy of left eye, with macular edema, associated with type 2 diabetes mellitus (HCC)  I43.3295 OCT, Retina - OU - Both Eyes    Intravitreal Injection, Pharmacologic Agent - OS - Left Eye    Bevacizumab (AVASTIN) SOLN 1.25 mg    2. Severe nonproliferative diabetic retinopathy of right eye, with macular edema, associated with type 2 diabetes mellitus (HCC)  E11.3411 OCT, Retina - OU - Both Eyes    Intravitreal Injection, Pharmacologic Agent - OD - Right Eye    Bevacizumab (AVASTIN) SOLN 1.25 mg    3. Vitreomacular adhesion of both eyes  H43.823     4. 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 Pseudophakia, both eyes  Z96.1       1.  OU, with diabetic macular edema.  Much improved today however on intravitreal injections Avastin currently at 5 weeks.  We will repeat injection today OU to maintain improvement  2.  Follow-up again in 1 month OU  3.  Ophthalmic Meds Ordered this visit:  Meds ordered this encounter  Medications   Bevacizumab (AVASTIN) SOLN 1.25 mg   Bevacizumab (AVASTIN) SOLN 1.25 mg       Return in about 1 month (around 07/24/2022) for DILATE OU,  AVASTIN OCT, OD, OS.  There are no Patient Instructions on file for this visit.   Explained the diagnoses, plan, and follow up with the patient and they expressed understanding.  Patient expressed understanding of the importance of proper follow up care.   Clent Demark Daylan Juhnke M.D. Diseases & Surgery of the Retina and Vitreous [facility] 06/23/22     Abbreviations: M myopia (nearsighted); A astigmatism; H hyperopia (farsighted); P presbyopia; Mrx spectacle prescription;  CTL contact lenses; OD right eye; OS left eye; OU both eyes  XT exotropia; ET esotropia; PEK punctate epithelial keratitis; PEE punctate epithelial erosions; DES dry eye syndrome; MGD meibomian gland dysfunction; ATs artificial tears; PFAT's preservative free artificial tears; [place] nuclear sclerotic cataract; PSC posterior subcapsular cataract; ERM epi-retinal membrane; PVD posterior vitreous detachment; RD retinal detachment; DM diabetes mellitus; DR diabetic retinopathy; NPDR non-proliferative diabetic retinopathy; PDR  proliferative diabetic retinopathy; CSME clinically significant macular edema; DME diabetic macular edema; dbh dot blot hemorrhages; CWS cotton wool spot; POAG primary open angle glaucoma; C/D cup-to-disc ratio; HVF humphrey visual field; GVF goldmann visual field; OCT optical coherence tomography; IOP intraocular pressure; BRVO Branch retinal vein occlusion; CRVO central retinal vein occlusion; CRAO central retinal artery occlusion; BRAO branch retinal artery occlusion; RT retinal tear; SB scleral buckle; PPV pars plana vitrectomy; VH Vitreous hemorrhage; PRP panretinal laser photocoagulation; IVK intravitreal kenalog; VMT vitreomacular traction; MH Macular hole;  NVD neovascularization of the disc; NVE neovascularization elsewhere; AREDS age related eye disease study; ARMD age related macular degeneration; POAG primary open angle glaucoma; EBMD epithelial/anterior basement membrane dystrophy; ACIOL  anterior chamber intraocular lens; IOL intraocular lens; PCIOL posterior chamber intraocular lens; Phaco/IOL phacoemulsification with intraocular lens placement; [place] photorefractive keratectomy; LASIK laser assisted in situ keratomileusis; HTN hypertension; DM diabetes mellitus; COPD chronic obstructive pulmonary disease

## 2022-06-23 NOTE — Assessment & Plan Note (Signed)
OS may have some component of VMA VM traction affecting the CSME

## 2022-06-23 NOTE — Assessment & Plan Note (Signed)
Looks great OU

## 2022-07-28 ENCOUNTER — Encounter (INDEPENDENT_AMBULATORY_CARE_PROVIDER_SITE_OTHER): Payer: Medicare PPO | Admitting: Ophthalmology

## 2022-07-29 ENCOUNTER — Ambulatory Visit (INDEPENDENT_AMBULATORY_CARE_PROVIDER_SITE_OTHER): Payer: Medicare PPO | Admitting: Ophthalmology

## 2022-07-29 DIAGNOSIS — E113511 Type 2 diabetes mellitus with proliferative diabetic retinopathy with macular edema, right eye: Secondary | ICD-10-CM

## 2022-07-29 DIAGNOSIS — E113413 Type 2 diabetes mellitus with severe nonproliferative diabetic retinopathy with macular edema, bilateral: Secondary | ICD-10-CM

## 2022-07-29 DIAGNOSIS — E113411 Type 2 diabetes mellitus with severe nonproliferative diabetic retinopathy with macular edema, right eye: Secondary | ICD-10-CM | POA: Diagnosis not present

## 2022-07-29 DIAGNOSIS — E113412 Type 2 diabetes mellitus with severe nonproliferative diabetic retinopathy with macular edema, left eye: Secondary | ICD-10-CM | POA: Diagnosis not present

## 2022-07-29 MED ORDER — BEVACIZUMAB CHEMO INJECTION 1.25MG/0.05ML SYRINGE FOR KALEIDOSCOPE
1.2500 mg | INTRAVITREAL | Status: AC | PRN
  Administered 2022-07-29: 1.25 mg via INTRAVITREAL

## 2022-07-29 NOTE — Assessment & Plan Note (Signed)
The nature of severe nonproliferative diabetic retinopathy discussed with the patient as well as the need for more frequent follow up and likely progression to proliferative disease in the near future. The options of continued observation versus panretinal photocoagulation at this time were reviewed as well as the risks, benefits, and alternatives. More recent option includes the use of ocular injectable medications to slow progression of retinal disease. Tight control of glucose, blood pressure, and serum lipid levels were recommended under the direction of general physician or endocrinologist, as well as avoidance of smoking and maintenance of normal body weight. The 2-year risk of progression to proliferative diabetic retinopathy is 60%.  CSME improving.  Repeat injection today reevaluate next possible injection

## 2022-07-29 NOTE — Progress Notes (Signed)
07/29/2022     CHIEF COMPLAINT Patient presents for  Chief Complaint  Patient presents with   Diabetic Retinopathy with Macular Edema      HISTORY OF PRESENT ILLNESS: Sara Graves is a 81 y.o. 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  5 weeks post most recent injection evaluation 1 mth dilate ou avastin oct od os Pt states her vision has been stable Pt denies any new floaters or FOL    Last edited by Hurman Horn, MD on 07/29/2022  4:48 PM.      Referring physician: [name], PA-C [place],  [place] [id number]  HISTORICAL INFORMATION:   Selected notes from the MEDICAL RECORD NUMBER    Lab Results  Component Value Date   HGBA1C 9.8 (H) 02/12/2021     CURRENT MEDICATIONS: No current outpatient medications on file. (Ophthalmic Drugs)   No current facility-administered medications for this visit. (Ophthalmic Drugs)   Current Outpatient Medications (Other)  Medication Sig   alendronate (FOSAMAX) 70 MG tablet Take 1 tablet (70 mg total) by mouth once a week.   aspirin EC 81 MG tablet Take 81 mg by mouth daily.   busPIRone (BUSPAR) 5 MG tablet Take 5 mg by mouth 2 (two) times daily. As needed   Calcium Carb-Cholecalciferol (CALCIUM 600 + D PO) Take by mouth.   dapagliflozin propanediol (FARXIGA) 10 MG TABS tablet Take 1 tablet (10 mg total) by mouth daily.   ezetimibe (ZETIA) 10 MG tablet Take 10 mg by mouth daily.   gabapentin (NEURONTIN) 100 MG capsule Take 100 mg by mouth at bedtime.   glucose blood test strip 1 each 3 (three) times daily.    insulin aspart (NOVOLOG FLEXPEN) 100 UNIT/ML FlexPen Inject into the skin.   insulin degludec (TRESIBA) 100 UNIT/ML FlexTouch Pen Inject into the skin.   isosorbide dinitrate (ISORDIL) 40 MG tablet TAKE 1 TABLET BY MOUTH 3 TIMES DAILY.   levothyroxine (SYNTHROID, LEVOTHROID) 50 MCG tablet Take 50 mcg by mouth  daily before breakfast.    Magnesium Oxide 500 MG TABS TAKE 1 TABLET (500 MG TOTAL) BY MOUTH IN THE MORNING AND AT BEDTIME. (Patient taking differently: Take 500 mg by mouth in the morning, at noon, and at bedtime.)   methocarbamol (ROBAXIN) 500 MG tablet Take 500 mg by mouth 4 (four) times daily.   metoprolol succinate (TOPROL-XL) 50 MG 24 hr tablet TAKE 1 TABLET BY MOUTH EVERY DAY WITH OR IMMEDIATELY FOLLOWING A MEAL   nitroGLYCERIN (NITROSTAT) 0.4 MG SL tablet PLACE 1 TABLET UNDER THE TONGUE EVERY 5 MINUTES X 3 DOSES AS NEEDED FOR CHEST PAIN. (Patient taking differently: Place 0.4 mg under the tongue every 5 (five) minutes as needed for chest pain.)   ONETOUCH VERIO test strip 1 each by Other route 3 (three) times daily.   rosuvastatin (CRESTOR) 40 MG tablet Take 1 tablet (40 mg total) by mouth at bedtime.   sacubitril-valsartan (ENTRESTO) 97-103 MG Take 1 tablet by mouth 2 (two) times daily.   torsemide (DEMADEX) 20 MG tablet Take 20 mg by mouth every other day.   vitamin B-12 (CYANOCOBALAMIN) 1000 MCG tablet Take 1,000 mcg by mouth daily.    Vitamin D, Ergocalciferol, (DRISDOL) 50000 units CAPS capsule Take 50,000 Units by mouth every 7 (seven) days. Take on Thursdays   No current facility-administered medications for this visit. (Other)  REVIEW OF SYSTEMS: ROS   Negative for: Constitutional, Gastrointestinal, Neurological, Skin, Genitourinary, Musculoskeletal, HENT, Endocrine, Cardiovascular, Eyes, Respiratory, Psychiatric, Allergic/Imm, Heme/Lymph Last edited by Morene Rankins, CMA on 07/29/2022  3:29 PM.       ALLERGIES Allergies  Allergen Reactions   Rosiglitazone Maleate Anaphylaxis and Swelling   Morphine Other (See Comments)    SEVERE HYPOTENSION    Tramadol Other (See Comments)    Unknown reaction   Cephalexin Diarrhea   Elavil [Amitriptyline] Nausea Only   Sulfa Antibiotics Diarrhea and Nausea And Vomiting   Tramadol-Acetaminophen Rash    PAST MEDICAL  HISTORY Past Medical History:  Diagnosis Date   Acute combined systolic and diastolic heart failure ([place]) 04/25/2018   Anxiety    Arthritis    "hands" (12/29/2017)   Basal cell carcinoma of nose    removed   Bursitis of left shoulder    BV ICD [employer] [place] MRI CRTD 06/21/2019   Cat scratch fever    Late 90s   Cataract, nuclear sclerotic, both eyes 05/13/2021   Chronic combined systolic and diastolic CHF (congestive heart failure) ([place]) 06/08/2019   Coronary artery disease    Depression    Diabetes mellitus, type II, insulin dependent ([place])    Encounter for assessment of implantable cardioverter-defibrillator (ICD) 09/28/2019   GERD (gastroesophageal reflux disease)    H. pylori infection 2008 and 1998   treated   Hyperlipidemia    Hypertension    Hypothyroidism    Low oxygen saturation    Macular degeneration    Multinodular goiter    Rotator cuff tear, left recurrent    Urge urinary incontinence    UTI (lower urinary tract infection) 05/2016   Past Surgical History:  Procedure Laterality Date   APPENDECTOMY  AGE 89   BACK SURGERY     BASAL CELL CARCINOMA EXCISION     "nose"   BIV ICD INSERTION CRT-D N/A 06/21/2019   Procedure: BIV ICD INSERTION CRT-D;  Surgeon: Evans Lance, MD;  Location: [facility];  Service: Cardiovascular;  Laterality: N/A;   BLADDER NECK SUSPENSION  1970's   BUNIONECTOMY WITH HAMMERTOE RECONSTRUCTION Bilateral    CARPAL TUNNEL RELEASE Right 05/2017   COLONOSCOPY W/ POLYPECTOMY     CORONARY ANGIOPLASTY WITH STENT PLACEMENT  12/29/2017   CORONARY BALLOON ANGIOPLASTY N/A 02/08/2019   Procedure: CORONARY BALLOON ANGIOPLASTY;  Surgeon: Adrian Prows, MD;  Location: [facility];  Service: Cardiovascular;  Laterality: N/A;   CORONARY STENT INTERVENTION N/A 12/29/2017   Procedure: CORONARY STENT INTERVENTION;  Surgeon: Nigel Mormon, MD;  Location: [facility];  Service: Cardiovascular;  Laterality: N/A;   CORONARY STENT  INTERVENTION N/A 04/26/2018   Procedure: CORONARY STENT INTERVENTION;  Surgeon: Nigel Mormon, MD;  Location: [facility];  Service: Cardiovascular;  Laterality: N/A;   CORONARY STENT INTERVENTION N/A 02/08/2019   Procedure: CORONARY STENT INTERVENTION;  Surgeon: Adrian Prows, MD;  Location: [facility];  Service: Cardiovascular;  Laterality: N/A;   ESOPHAGOGASTRODUODENOSCOPY ENDOSCOPY     FORAMINAL DECOMPRESSION AT L2 TO THE SACRUM  01-05-2008   L2  -  S1   GANGLION CYST EXCISION Left 01/17/2009   ring finger   IMPLANTATION PERMANENT SPINAL CORD STIMULATOR  06-15-2008   JUNE 2013--  BATTERY CHANGE   JOINT REPLACEMENT     LEFT HEART CATH AND CORONARY ANGIOGRAPHY N/A 04/26/2018   Procedure: LEFT HEART CATH AND CORONARY ANGIOGRAPHY;  Surgeon: Nigel Mormon, MD;  Location:  [facility];  Service: Cardiovascular;  Laterality: N/A;   LEFT HEART CATH AND CORONARY ANGIOGRAPHY N/A 02/08/2019   Procedure: LEFT HEART CATH AND CORONARY ANGIOGRAPHY;  Surgeon: Adrian Prows, MD;  Location: [facility];  Service: Cardiovascular;  Laterality: N/A;   LIPOMA EXCISION Right    RIGHT ELBOW   METATARSAL HEAD EXCISION Right 06/16/2018   Procedure: right 5th metatarsal excision, a mini c-arm;  Surgeon: Trula Slade, DPM;  Location: [facility];  Service: Podiatry;  Laterality: Right;  anesthesia can do block   RIGHT/LEFT HEART CATH AND CORONARY ANGIOGRAPHY N/A 12/29/2017   Procedure: RIGHT/LEFT HEART CATH AND CORONARY ANGIOGRAPHY;  Surgeon: Nigel Mormon, MD;  Location: [facility];  Service: Cardiovascular;  Laterality: N/A;   SHOULDER OPEN ROTATOR CUFF REPAIR  09/07/2012   Procedure: ROTATOR CUFF REPAIR SHOULDER OPEN;  Surgeon: Magnus Sinning, MD;  Location: [facility];  Service: Orthopedics;  Laterality: Left;  OPEN ANTERIOR ACROMIONECTOMY AND ROTATOR CUFF REPAIR ON LEFT    SHOULDER OPEN ROTATOR CUFF REPAIR Left 12/28/2012   Procedure: ROTATOR CUFF  REPAIR SHOULDER OPEN;  Surgeon: Magnus Sinning, MD;  Location: [facility];  Service: Orthopedics;  Laterality: Left;  OPEN SHOULDER ROTATOR CUFF REPAIR ON LEFT  WITH ANTERIOR ACROMINECTOMY    SHOULDER OPEN ROTATOR CUFF REPAIR Right 03/28/2003   RIGHT SHOULDER  DEGENERATIVE AC JOINT AND RC TEAR   SPINAL CORD STIMULATOR BATTERY EXCHANGE N/A 07/03/2016   Procedure: SPINAL CORD STIMULATOR BATTERY PLACMENT;  Surgeon: Melina Schools, MD;  Location: [facility];  Service: Orthopedics;  Laterality: N/A;   TONSILLECTOMY  AGE 81   TOTAL KNEE ARTHROPLASTY Right 05/06/2000   OA RIGHT KNEE   VAGINAL HYSTERECTOMY  1970's    FAMILY HISTORY Family History  Problem Relation Age of Onset   Cancer Mother        breast   Heart Problems Mother    Prostate cancer Father    Muscular dystrophy Son    Lung cancer Son    Cancer Maternal Grandmother        breast   Heart disease Maternal Grandfather    Breast cancer Sister     SOCIAL HISTORY Social History   Tobacco Use   Smoking status: Never   Smokeless tobacco: Never  Vaping Use   Vaping Use: Never used  Substance Use Topics   Alcohol use: No   Drug use: No         OPHTHALMIC EXAM:  Base Eye Exam     Visual Acuity (ETDRS)       Right Left   Dist [place] 20/25 -1 20/30 +1         Tonometry (Tonopen, 3:34 PM)       Right Left   Pressure 11 8         Extraocular Movement       Right Left    Ortho Ortho    -- -- --  --  --  -- -- --   -- -- --  --  --  -- -- --           Neuro/Psych     Oriented x3: Yes   Mood/Affect: Normal         Dilation     Both eyes: 1.0% Mydriacyl, 2.5% Phenylephrine @ 3:30 PM           Slit Lamp and Fundus Exam     External Exam  Right Left   External Normal Normal         Slit Lamp Exam       Right Left   Lids/Lashes Normal Normal   Conjunctiva/Sclera White and quiet White and quiet   Cornea Clear Clear   Anterior Chamber Deep and quiet Deep and  quiet   Iris Round and reactive Round and reactive   Lens Posterior chamber intraocular lens Posterior chamber intraocular lens   Anterior Vitreous Normal Normal         Fundus Exam       Right Left   Posterior Vitreous Normal Normal   Disc Normal Normal   C/D Ratio 0.3 0.3   Macula Microaneurysms, less macular thickening, Moderate clinically significant macular edema, center involved Microaneurysms, Macular thickening, Moderate clinically significant macular edema, superotemporal to FAZ   Vessels NPDR-Severe, with NVE INF NASAL TO NERVE NPDR-Severe   Periphery PRP 270, needs PRP completion superiorly OD Normal            IMAGING AND PROCEDURES  Imaging and Procedures for 07/29/22  OCT, Retina - OU - Both Eyes       Right Eye Quality was good. Scan locations included subfoveal. Central Foveal Thickness: 382. Progression has improved. Findings include abnormal foveal contour.   Left Eye Quality was good. Scan locations included extrafoveal. Central Foveal Thickness: 368. Progression has worsened. Findings include abnormal foveal contour, vitreomacular adhesion .   Notes Center involved CSME OD, improved today at interval of follow-up of 5 weeks will need repeat injection today and reevaluate right in 8 weeks OS also center involved CSME improving Yet needs repeat dilation as scheduled much improved at 5 week today post recent injection, will repeat injection OS today as well     Intravitreal Injection, Pharmacologic Agent - OD - Right Eye       Time Out 07/29/2022. 4:47 PM. Confirmed correct patient, procedure, site, and patient consented.   Anesthesia Topical anesthesia was used. Anesthetic medications included Lidocaine 4%.   Procedure Preparation included 5% betadine to ocular surface, 10% betadine to eyelids, Tobramycin 0.3%. A 30 gauge needle was used.   Injection: 1.25 mg Bevacizumab 1.'25mg'$ /0.68m   Route: Intravitreal, Site: Right Eye   NDC: 5H061816  Lot: [id number] Expiration date: 10/01/2022   Post-op Post injection exam found visual acuity of at least counting fingers. The patient tolerated the procedure well. There were no complications. The patient received written and verbal post procedure care education. Post injection medications included ocuflox.      Intravitreal Injection, Pharmacologic Agent - OS - Left Eye       Time Out 07/29/2022. 4:47 PM. Confirmed correct patient, procedure, site, and patient consented.   Anesthesia Topical anesthesia was used. Anesthetic medications included Lidocaine 4%.   Procedure Preparation included 5% betadine to ocular surface, 10% betadine to eyelids, Tobramycin 0.3%, Ofloxacin . A 30 gauge needle was used.   Injection: 1.25 mg Bevacizumab 1.'25mg'$ /0.074m  Route: Intravitreal, Site: Left Eye   NDC: 50H061816Lot: [id number] date: 10/01/2022   Post-op Post injection exam found visual acuity of at least counting fingers. The patient tolerated the procedure well. There were no complications. The patient received written and verbal post procedure care education. Post injection medications included ocuflox.              ASSESSMENT/PLAN:  Severe nonproliferative diabetic retinopathy of left eye, with macular edema, associated with type 2 diabetes mellitus (HCRealThe nature of severe nonproliferative diabetic retinopathy discussed  with the patient as well as the need for more frequent follow up and likely progression to proliferative disease in the near future. The options of continued observation versus panretinal photocoagulation at this time were reviewed as well as the risks, benefits, and alternatives. More recent option includes the use of ocular injectable medications to slow progression of retinal disease. Tight control of glucose, blood pressure, and serum lipid levels were recommended under the direction of general physician or endocrinologist, as well as avoidance of smoking  and maintenance of normal body weight. The 2-year risk of progression to proliferative diabetic retinopathy is 60%.  CSME improving.  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  Repeat injection today reevaluate next possible injection     ICD-10-CM   1. Severe nonproliferative diabetic retinopathy of right eye, with macular edema, associated with type 2 diabetes mellitus (HCC)  E11.3411 Intravitreal Injection, Pharmacologic Agent - OD - Right Eye    Bevacizumab (AVASTIN) SOLN 1.25 mg    2. Severe nonproliferative diabetic retinopathy of left eye, with macular edema, associated with type 2 diabetes mellitus (HCC)  Z56.3875 OCT, Retina - OU - Both Eyes    Intravitreal Injection, Pharmacologic Agent - OS - Left Eye    Bevacizumab (AVASTIN) SOLN 1.25 mg    3. 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  2.  3.  Ophthalmic Meds Ordered this visit:  Meds ordered this encounter  Medications   Bevacizumab (AVASTIN) SOLN 1.25 mg   Bevacizumab (AVASTIN) SOLN 1.25 mg       Return in about 8 weeks (around 09/23/2022) for DILATE OU, AVASTIN OCT, OD, OS.  There are no Patient Instructions on file for this visit.   Explained the diagnoses, plan, and follow up with the patient and they expressed understanding.  Patient expressed understanding of the importance of proper follow up care.   Clent Demark Domnique Vanegas M.D. Diseases & Surgery of the Retina and Vitreous Retina & Diabetic [place] 07/29/22     Abbreviations: M myopia (nearsighted); A astigmatism; H hyperopia (farsighted); P presbyopia; Mrx spectacle prescription;  CTL contact lenses; OD right eye; OS left eye; OU both eyes  XT exotropia; ET esotropia; PEK punctate epithelial keratitis; PEE punctate  epithelial erosions; DES dry eye syndrome; MGD meibomian gland dysfunction; ATs artificial tears; PFAT's preservative free artificial tears; [place] nuclear sclerotic cataract; PSC posterior subcapsular cataract; ERM epi-retinal membrane; PVD posterior vitreous detachment; RD retinal detachment; DM diabetes mellitus; DR diabetic retinopathy; NPDR non-proliferative diabetic retinopathy; PDR proliferative diabetic retinopathy; CSME clinically significant macular edema; DME diabetic macular edema; dbh dot blot hemorrhages; CWS cotton wool spot; POAG primary open angle glaucoma; C/D cup-to-disc ratio; HVF humphrey visual field; GVF goldmann visual field; OCT optical coherence tomography; IOP intraocular pressure; BRVO Branch retinal vein occlusion; CRVO central retinal vein occlusion; CRAO central retinal artery occlusion; BRAO branch retinal artery occlusion; RT retinal tear; SB scleral buckle; PPV pars plana vitrectomy; VH Vitreous hemorrhage; PRP panretinal laser photocoagulation; IVK intravitreal kenalog; VMT vitreomacular traction; MH Macular hole;  NVD neovascularization of the disc; NVE neovascularization elsewhere; AREDS age related eye disease study; ARMD age related macular degeneration; POAG primary open angle glaucoma; EBMD epithelial/anterior basement membrane dystrophy; ACIOL anterior chamber intraocular lens; IOL intraocular lens; PCIOL posterior chamber intraocular lens; Phaco/IOL phacoemulsification  with intraocular lens placement; [place] photorefractive keratectomy; LASIK laser assisted in situ keratomileusis; HTN hypertension; DM diabetes mellitus; COPD chronic obstructive pulmonary disease

## 2022-07-29 NOTE — Assessment & Plan Note (Signed)
CSME improving OD.  Repeat injection today reevaluate next possible injection

## 2022-08-08 ENCOUNTER — Ambulatory Visit: Payer: Medicare PPO | Admitting: Podiatry

## 2022-08-08 ENCOUNTER — Ambulatory Visit (INDEPENDENT_AMBULATORY_CARE_PROVIDER_SITE_OTHER): Payer: Medicare PPO

## 2022-08-08 ENCOUNTER — Encounter: Payer: Self-pay | Admitting: Podiatry

## 2022-08-08 DIAGNOSIS — M79675 Pain in left toe(s): Secondary | ICD-10-CM

## 2022-08-08 DIAGNOSIS — M7751 Other enthesopathy of right foot: Secondary | ICD-10-CM

## 2022-08-08 DIAGNOSIS — M79674 Pain in right toe(s): Secondary | ICD-10-CM | POA: Diagnosis not present

## 2022-08-08 DIAGNOSIS — B351 Tinea unguium: Secondary | ICD-10-CM

## 2022-08-08 DIAGNOSIS — M79671 Pain in right foot: Secondary | ICD-10-CM

## 2022-08-08 MED ORDER — TRIAMCINOLONE ACETONIDE 10 MG/ML IJ SUSP
10.0000 mg | Freq: Once | INTRAMUSCULAR | Status: AC
  Administered 2022-08-08: 10 mg

## 2022-08-08 NOTE — Progress Notes (Signed)
G

## 2022-08-10 NOTE — Progress Notes (Signed)
Subjective:   [name], female   DOB: 81 y.o.   MRN: [id number]   HPI Patient presents with a lot of inflammation pain on the lateral side of the fifth metatarsal and not sure what might be causing this.  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  I then debrided nailbeds 1-5 both feet no iatrogenic bleeding reappoint to recheck  X-rays indicate there is no signs of bone issues here there was some surgery done in the area which appears stable currently

## 2022-08-14 ENCOUNTER — Other Ambulatory Visit: Payer: Self-pay | Admitting: Podiatry

## 2022-08-14 DIAGNOSIS — M779 Enthesopathy, unspecified: Secondary | ICD-10-CM

## 2022-09-17 ENCOUNTER — Encounter: Payer: Self-pay | Admitting: Internal Medicine

## 2022-09-17 ENCOUNTER — Ambulatory Visit: Payer: Medicare PPO | Admitting: Internal Medicine

## 2022-09-17 ENCOUNTER — Ambulatory Visit: Payer: Medicare PPO | Admitting: Student

## 2022-09-17 VITALS — BP 183/70 | HR 69 | Resp 16 | Ht 62.0 in | Wt 202.0 lb

## 2022-09-17 DIAGNOSIS — E7800 Pure hypercholesterolemia, unspecified: Secondary | ICD-10-CM

## 2022-09-17 DIAGNOSIS — I2510 Atherosclerotic heart disease of native coronary artery without angina pectoris: Secondary | ICD-10-CM

## 2022-09-17 DIAGNOSIS — I10 Essential (primary) hypertension: Secondary | ICD-10-CM

## 2022-09-17 NOTE — Progress Notes (Signed)
Primary Physician:  [name], PA-C   [name], female    DOB: 08-Apr-1941, 81 y.o.   MRN: [id number]   Date: 09/17/22 Last Office Visit: 05/24/2021   Subjective:    Chief Complaint  Patient presents with   Coronary Artery Disease   Cardiomyopathy   Follow-up    6 month    HPI: Sara Graves  is a 81 y.o. female  with hypertension, insulin-dependent diabetes mellitus type 2,  CAD s/p prior LAD and ramus PCI,  HFrEF, ischemic cardiomyopathy, s/p BiV ICD implantation 06/2019,  h/o Rt leg cellulitis and metatarsal fracture (managed by Podiatry) with chief complaint of " 3 month followup for congestive heart failure."   Patient is here for a follow-up visit. She has been feeling well since the last time she was here. Patient is compliant with her medications. Her BP is very high today, however, this is typical for her. Patient does have very well controlled pressures at home. Denies chest pain, shortness of breath, palpitations, diaphoresis, syncope, edema, PND, orthopnea.   Past Medical History:  Diagnosis Date   Acute combined systolic and diastolic heart failure ([place]) 04/25/2018   Anxiety    Arthritis    "hands" (12/29/2017)   Basal cell carcinoma of nose    removed   Bursitis of left shoulder    BV ICD [employer] [place] MRI CRTD 06/21/2019   Cat scratch fever    Late 90s   Cataract, nuclear sclerotic, both eyes 05/13/2021   Chronic combined systolic and diastolic CHF (congestive heart failure) ([place]) 06/08/2019   Coronary artery disease    Depression    Diabetes mellitus, type II, insulin dependent ([place])    Encounter for assessment of implantable cardioverter-defibrillator (ICD) 09/28/2019   GERD (gastroesophageal reflux disease)    H. pylori infection 2008 and 1998   treated   Hyperlipidemia    Hypertension    Hypothyroidism    Low oxygen saturation    Macular degeneration    Multinodular goiter    Rotator cuff tear, left recurrent    Urge  urinary incontinence    UTI (lower urinary tract infection) 05/2016   Family History  Problem Relation Age of Onset   Cancer Mother        breast   Heart Problems Mother    Prostate cancer Father    Muscular dystrophy Son    Lung cancer Son    Cancer Maternal Grandmother        breast   Heart disease Maternal Grandfather    Breast cancer Sister    Past Surgical History:  Procedure Laterality Date   APPENDECTOMY  AGE 24   BACK SURGERY     BASAL CELL CARCINOMA EXCISION     "nose"   BIV ICD INSERTION CRT-D N/A 06/21/2019   Procedure: BIV ICD INSERTION CRT-D;  Surgeon: Evans Lance, MD;  Location: [facility];  Service: Cardiovascular;  Laterality: N/A;   BLADDER NECK SUSPENSION  1970's   BUNIONECTOMY WITH HAMMERTOE RECONSTRUCTION Bilateral    CARPAL TUNNEL RELEASE Right 05/2017   COLONOSCOPY W/ POLYPECTOMY     CORONARY ANGIOPLASTY WITH STENT PLACEMENT  12/29/2017   CORONARY BALLOON ANGIOPLASTY N/A 02/08/2019   Procedure: CORONARY BALLOON ANGIOPLASTY;  Surgeon: Adrian Prows, MD;  Location: [place] [facility];  Service: Cardiovascular;  Laterality: N/A;   CORONARY STENT INTERVENTION N/A 12/29/2017   Procedure: CORONARY STENT INTERVENTION;  Surgeon: Nigel Mormon, MD;  Location: [facility]  INVASIVE [facility];  Service: Cardiovascular;  Laterality: N/A;   CORONARY STENT INTERVENTION N/A 04/26/2018   Procedure: CORONARY STENT INTERVENTION;  Surgeon: Nigel Mormon, MD;  Location: [place] [facility];  Service: Cardiovascular;  Laterality: N/A;   CORONARY STENT INTERVENTION N/A 02/08/2019   Procedure: CORONARY STENT INTERVENTION;  Surgeon: Adrian Prows, MD;  Location: [place] [facility];  Service: Cardiovascular;  Laterality: N/A;   ESOPHAGOGASTRODUODENOSCOPY ENDOSCOPY     FORAMINAL DECOMPRESSION AT L2 TO THE SACRUM  01-05-2008   L2  -  S1   GANGLION CYST EXCISION Left 01/17/2009   ring finger   IMPLANTATION PERMANENT SPINAL CORD STIMULATOR  06-15-2008   JUNE 2013--  BATTERY CHANGE    JOINT REPLACEMENT     LEFT HEART CATH AND CORONARY ANGIOGRAPHY N/A 04/26/2018   Procedure: LEFT HEART CATH AND CORONARY ANGIOGRAPHY;  Surgeon: Nigel Mormon, MD;  Location: [place] [facility];  Service: Cardiovascular;  Laterality: N/A;   LEFT HEART CATH AND CORONARY ANGIOGRAPHY N/A 02/08/2019   Procedure: LEFT HEART CATH AND CORONARY ANGIOGRAPHY;  Surgeon: Adrian Prows, MD;  Location: [place] [facility];  Service: Cardiovascular;  Laterality: N/A;   LIPOMA EXCISION Right    RIGHT ELBOW   METATARSAL HEAD EXCISION Right 06/16/2018   Procedure: right 5th metatarsal excision, a mini c-arm;  Surgeon: Trula Slade, DPM;  Location: [facility];  Service: Podiatry;  Laterality: Right;  anesthesia can do block   RIGHT/LEFT HEART CATH AND CORONARY ANGIOGRAPHY N/A 12/29/2017   Procedure: RIGHT/LEFT HEART CATH AND CORONARY ANGIOGRAPHY;  Surgeon: Nigel Mormon, MD;  Location: [place] [facility];  Service: Cardiovascular;  Laterality: N/A;   SHOULDER OPEN ROTATOR CUFF REPAIR  09/07/2012   Procedure: ROTATOR CUFF REPAIR SHOULDER OPEN;  Surgeon: Magnus Sinning, MD;  Location: [place];  Service: Orthopedics;  Laterality: Left;  OPEN ANTERIOR ACROMIONECTOMY AND ROTATOR CUFF REPAIR ON LEFT    SHOULDER OPEN ROTATOR CUFF REPAIR Left 12/28/2012   Procedure: ROTATOR CUFF REPAIR SHOULDER OPEN;  Surgeon: Magnus Sinning, MD;  Location: [place];  Service: Orthopedics;  Laterality: Left;  OPEN SHOULDER ROTATOR CUFF REPAIR ON LEFT  WITH ANTERIOR ACROMINECTOMY    SHOULDER OPEN ROTATOR CUFF REPAIR Right 03/28/2003   RIGHT SHOULDER  DEGENERATIVE AC JOINT AND RC TEAR   SPINAL CORD STIMULATOR BATTERY EXCHANGE N/A 07/03/2016   Procedure: SPINAL CORD STIMULATOR BATTERY PLACMENT;  Surgeon: Melina Schools, MD;  Location: [place];  Service: Orthopedics;  Laterality: N/A;   TONSILLECTOMY  AGE 55   TOTAL KNEE ARTHROPLASTY Right 05/06/2000   OA RIGHT KNEE   VAGINAL HYSTERECTOMY   1970's   Social History   Tobacco Use   Smoking status: Never   Smokeless tobacco: Never  Vaping Use   Vaping Use: Never used  Substance Use Topics   Alcohol use: No   Drug use: No    Review of Systems  Constitutional: Negative for chills, decreased appetite and malaise/fatigue.  Cardiovascular:  Negative for chest pain, dyspnea on exertion, leg swelling, orthopnea and syncope.       Left arm swelling  Respiratory:  Negative for cough.   Endocrine: Negative for cold intolerance.  Hematologic/Lymphatic: Does not bruise/bleed easily.  Musculoskeletal:  Negative for falls and joint swelling.  Gastrointestinal:  Negative for abdominal pain, anorexia, change in bowel habit, diarrhea and nausea.  Neurological:  Negative for headaches and light-headedness.  Psychiatric/Behavioral:  Negative for depression and substance abuse.   All  other systems reviewed and are negative.     Objective:      09/17/2022   10:34 AM 03/17/2022   12:48 PM 02/06/2022    9:26 AM  Vitals with BMI  Height '5\' 2"'$  '5\' 2"'$  '5\' 2"'$   Weight 202 lbs 204 lbs 10 oz 208 lbs 6 oz  BMI 36.94 31.51 76.16  Systolic 073 710 626  Diastolic 70 73 74  Pulse 69 61 50   CONSTITUTIONAL: hemodynamically stable, no acute distress.  SKIN: Skin is warm and dry. No rash noted. No cyanosis. No pallor. No jaundice HEAD: Normocephalic and atraumatic.  CHEST Normal respiratory effort. No intercostal retractions.  BiV ICD site is clean dry and intact LUNGS: Clear to auscultation bilaterally.  No stridor. No wheezes. No rales.  CARDIOVASCULAR: Regular, positive R4-W5, soft holosystolic murmur heard at the apex, no gallops or rubs. Capillary refill 2-3 seconds ABDOMINAL: Soft, nontender, nondistended, positive bowel sounds in all 4 quadrants, no apparent ascites.  EXTREMITIES: trace bilateral ankle edema. Left arm mildly swollen. Radial pulses 2+ bilaterally.    Laboratory examination:      Latest Ref Rng & Units 12/12/2021    1:16  PM 11/27/2021   10:33 AM 08/27/2021    2:36 PM  CMP  Glucose 70 - 99 mg/dL 257  97  153   BUN 8 - 27 mg/dL 28  20  40   Creatinine 0.57 - 1.00 mg/dL 1.36  0.99  1.37   Sodium 134 - 144 mmol/L 145  143  143   Potassium 3.5 - 5.2 mmol/L 3.8  3.5  4.1   Chloride 96 - 106 mmol/L 104  108  105   CO2 20 - 29 mmol/L '23  22  23   '$ Calcium 8.7 - 10.3 mg/dL 9.1  8.5  10.0       Latest Ref Rng & Units 06/07/2021   11:53 AM 02/12/2021    3:19 AM 02/11/2021    5:55 PM  CBC  WBC 4.0 - 10.5 K/uL 5.0  4.1  4.4   Hemoglobin 12.0 - 15.0 g/dL 12.4  11.9  13.1   Hematocrit 36.0 - 46.0 % 36.6  36.5  41.0   Platelets 150 - 400 K/uL 102  92  94    Lipid Panel  Lab Results  Component Value Date   CHOL 203 (H) 06/03/2021   HDL 68 06/03/2021   LDLCALC 119 (H) 06/03/2021   LDLDIRECT 112 (H) 06/03/2021   TRIG 88 06/03/2021   CHOLHDL 2.9 12/26/2019    HEMOGLOBIN A1C Lab Results  Component Value Date   HGBA1C 9.8 (H) 02/12/2021   MPG 234.56 02/12/2021   TSH No results for input(s): "TSH" in the last 8760 hours. BNP (last 3 results) No results for input(s): "BNP" in the last 8760 hours.   ProBNP (last 3 results) Recent Labs    11/27/21 1033 12/12/21 1316  PROBNP 6,748* 3,003*   PRN Meds:. There are no discontinued medications.    Current Meds  Medication Sig   alendronate (FOSAMAX) 70 MG tablet Take 1 tablet (70 mg total) by mouth once a week.   aspirin EC 81 MG tablet Take 81 mg by mouth daily.   busPIRone (BUSPAR) 5 MG tablet Take 5 mg by mouth 2 (two) times daily. As needed   Calcium Carb-Cholecalciferol (CALCIUM 600 + D PO) Take by mouth.   dapagliflozin propanediol (FARXIGA) 10 MG TABS tablet Take 1 tablet (10 mg total) by mouth daily.   Evolocumab (  REPATHA SURECLICK) 628 MG/ML SOAJ Inject into the skin.   ezetimibe (ZETIA) 10 MG tablet Take 10 mg by mouth daily.   gabapentin (NEURONTIN) 100 MG capsule Take 100 mg by mouth at bedtime.   glucose blood test strip 1 each 3 (three)  times daily.    insulin aspart (NOVOLOG FLEXPEN) 100 UNIT/ML FlexPen Inject into the skin.   insulin degludec (TRESIBA) 100 UNIT/ML FlexTouch Pen Inject into the skin.   isosorbide dinitrate (ISORDIL) 40 MG tablet TAKE 1 TABLET BY MOUTH 3 TIMES DAILY.   levothyroxine (SYNTHROID, LEVOTHROID) 50 MCG tablet Take 50 mcg by mouth daily before breakfast.    Magnesium Oxide 500 MG TABS TAKE 1 TABLET (500 MG TOTAL) BY MOUTH IN THE MORNING AND AT BEDTIME. (Patient taking differently: Take 500 mg by mouth in the morning, at noon, and at bedtime.)   methocarbamol (ROBAXIN) 500 MG tablet Take 500 mg by mouth 4 (four) times daily.   metoprolol succinate (TOPROL-XL) 50 MG 24 hr tablet TAKE 1 TABLET BY MOUTH EVERY DAY WITH OR IMMEDIATELY FOLLOWING A MEAL   nitroGLYCERIN (NITROSTAT) 0.4 MG SL tablet PLACE 1 TABLET UNDER THE TONGUE EVERY 5 MINUTES X 3 DOSES AS NEEDED FOR CHEST PAIN. (Patient taking differently: Place 0.4 mg under the tongue every 5 (five) minutes as needed for chest pain.)   ONETOUCH VERIO test strip 1 each by Other route 3 (three) times daily.   rosuvastatin (CRESTOR) 40 MG tablet Take 1 tablet (40 mg total) by mouth at bedtime.   sacubitril-valsartan (ENTRESTO) 97-103 MG Take 1 tablet by mouth 2 (two) times daily.   torsemide (DEMADEX) 20 MG tablet Take 20 mg by mouth every other day.   vitamin B-12 (CYANOCOBALAMIN) 1000 MCG tablet Take 1,000 mcg by mouth daily.    Vitamin D, Ergocalciferol, (DRISDOL) 50000 units CAPS capsule Take 50,000 Units by mouth every 7 (seven) days. Take on Thursdays    Radiology:  No results found.  Cardiac Studies:   EKG: 09/17/2022: ventricular paced rhythm, functioning appropriately 08/27/2021: Atrial and ventricular paced rhythm. 03/17/2022: Underlying sinus rhythm with ventricular pacing at a rate of 54 bpm   Echocardiogram: 02/12/2021: LVEF 20-25%, severely reduced left ventricular systolic function, global hypokinesis, LV size moderately dilated, mild LVH,  right ventricular systolic function moderately reduced, RV size mildly enlarged, PASP 68 mmHg, severe left atrial dilatation, moderately dilated right atrium, moderate to severe MR, moderate to severe TR, mild AR, IVC dilated estimated RAP 15 mmHg.   Stress test: None   Heart catheterization: Coronary angiogram 02/08/2019:  Distal left main 20% diffuse disease.  Ostial LAD stent shows diffuse 20% in-stent restenosis (3.0 x 15 mm resolute Onyx on 12/29/2017).  Mid LAD and mid to distal LAD there are tandem lesions 80% and 95%.  Ramus intermediate ostial 95 to 99% stenosis.  Previously placed RI stent widely patent ( 2.0 x 12 mm resolute on 04/26/2018).  30 to 40% tandem lesions in the RCA. Cutting Balloon angioplasty of the ramus intermediate ostial high-grade stenosis, 99% reduced to 0% and Cutting Balloon angioplasty of the mid LAD followed by stenting with 2.5 x 32 mm Synergy DES for high-grade 90% stenosis reduced to 0% and TIMI-3 to TIMI-3 flow maintained in both lesions.   Carotid artery duplex 12/02/2018: No hemodynamically significant arterial disease in the internal carotid artery bilaterally. Minimal plaque noted bilateral carotid arteries. Antegrade right vertebral artery flow. Antegrade left vertebral artery flow.  Remote CRT-D transmission 11/10/2021: AP 57%, CRT 97%. Longevity 7 years  and 6 months. Lead impedance and thresholds within normal limits. There were no high ventricular rate episodes, no mode switches. Thoracic impedance is at baseline and does not suggest volume overload state. Normal function.   Assessment:     ICD-10-CM   1. Coronary artery disease involving native coronary artery of native heart without angina pectoris  I25.10 EKG 12-Lead    2. Essential hypertension  I10     3. Pure hypercholesterolemia  E78.00        Recommendations:   Acute combined systolic and diastolic CHF, NYHA class 3 (HCC) Patient is relatively asymptomatic at this time Blood  pressure is elevated in the office today, however it is well controlled on home monitoring Continue Farxiga, Isordil, Entresto, metoprolol, torsemide   Ischemic cardiomyopathy Patient remained stable without evidence of acute decompensated heart failure at today's office visit. Management as per above. Status post coronary intervention and CRT-D.   Pure hypercholesterolemia Continue statin and Zetia. We will resume Repatha    Biventricular ICD (implantable cardioverter-defibrillator) in place We will continue to monitor   Coronary artery disease involving native coronary artery of native heart without angina pectoris Mains chest pain-free Continue present medical therapy including aspirin, statin, Zetia   Type 2 diabetes mellitus with other circulatory complication, with long-term current use of insulin ([place]) Educated on importance of glycemic control. Currently on Arni, statin therapy, and Farxiga. Currently managed by primary care provider.   Current Outpatient Medications:    alendronate (FOSAMAX) 70 MG tablet, Take 1 tablet (70 mg total) by mouth once a week., Disp: 4 tablet, Rfl: 1   aspirin EC 81 MG tablet, Take 81 mg by mouth daily., Disp: , Rfl:    busPIRone (BUSPAR) 5 MG tablet, Take 5 mg by mouth 2 (two) times daily. As needed, Disp: , Rfl:    Calcium Carb-Cholecalciferol (CALCIUM 600 + D PO), Take by mouth., Disp: , Rfl:    dapagliflozin propanediol (FARXIGA) 10 MG TABS tablet, Take 1 tablet (10 mg total) by mouth daily., Disp: 90 tablet, Rfl: 3   Evolocumab (REPATHA SURECLICK) 885 MG/ML SOAJ, Inject into the skin., Disp: , Rfl:    ezetimibe (ZETIA) 10 MG tablet, Take 10 mg by mouth daily., Disp: , Rfl:    gabapentin (NEURONTIN) 100 MG capsule, Take 100 mg by mouth at bedtime., Disp: , Rfl:    glucose blood test strip, 1 each 3 (three) times daily. , Disp: , Rfl:    insulin aspart (NOVOLOG FLEXPEN) 100 UNIT/ML FlexPen, Inject into the skin., Disp: , Rfl:    insulin  degludec (TRESIBA) 100 UNIT/ML FlexTouch Pen, Inject into the skin., Disp: , Rfl:    isosorbide dinitrate (ISORDIL) 40 MG tablet, TAKE 1 TABLET BY MOUTH 3 TIMES DAILY., Disp: 270 tablet, Rfl: 1   levothyroxine (SYNTHROID, LEVOTHROID) 50 MCG tablet, Take 50 mcg by mouth daily before breakfast. , Disp: , Rfl:    Magnesium Oxide 500 MG TABS, TAKE 1 TABLET (500 MG TOTAL) BY MOUTH IN THE MORNING AND AT BEDTIME. (Patient taking differently: Take 500 mg by mouth in the morning, at noon, and at bedtime.), Disp: 180 tablet, Rfl: 1   methocarbamol (ROBAXIN) 500 MG tablet, Take 500 mg by mouth 4 (four) times daily., Disp: , Rfl:    metoprolol succinate (TOPROL-XL) 50 MG 24 hr tablet, TAKE 1 TABLET BY MOUTH EVERY DAY WITH OR IMMEDIATELY FOLLOWING A MEAL, Disp: 90 tablet, Rfl: 0   nitroGLYCERIN (NITROSTAT) 0.4 MG SL tablet, PLACE 1 TABLET UNDER THE  TONGUE EVERY 5 MINUTES X 3 DOSES AS NEEDED FOR CHEST PAIN. (Patient taking differently: Place 0.4 mg under the tongue every 5 (five) minutes as needed for chest pain.), Disp: 25 tablet, Rfl: 2   ONETOUCH VERIO test strip, 1 each by Other route 3 (three) times daily., Disp: , Rfl: 5   rosuvastatin (CRESTOR) 40 MG tablet, Take 1 tablet (40 mg total) by mouth at bedtime., Disp: 90 tablet, Rfl: 3   sacubitril-valsartan (ENTRESTO) 97-103 MG, Take 1 tablet by mouth 2 (two) times daily., Disp: 180 tablet, Rfl: 3   torsemide (DEMADEX) 20 MG tablet, Take 20 mg by mouth every other day., Disp: , Rfl:    vitamin B-12 (CYANOCOBALAMIN) 1000 MCG tablet, Take 1,000 mcg by mouth daily. , Disp: , Rfl:    Vitamin D, Ergocalciferol, (DRISDOL) 50000 units CAPS capsule, Take 50,000 Units by mouth every 7 (seven) days. Take on Thursdays, Disp: , Rfl:   Orders Placed This Encounter  Procedures   EKG 12-Lead      Sara Flock, DO, [facility] 09/17/2022, 10:47 AM Office: [phone]

## 2022-09-23 ENCOUNTER — Encounter (INDEPENDENT_AMBULATORY_CARE_PROVIDER_SITE_OTHER): Payer: Medicare PPO | Admitting: Ophthalmology

## 2022-09-24 ENCOUNTER — Encounter (INDEPENDENT_AMBULATORY_CARE_PROVIDER_SITE_OTHER): Payer: Medicare PPO | Admitting: Ophthalmology

## 2023-03-09 ENCOUNTER — Other Ambulatory Visit: Payer: Self-pay | Admitting: Nurse Practitioner

## 2023-03-09 DIAGNOSIS — Z9889 Other specified postprocedural states: Secondary | ICD-10-CM

## 2023-03-09 DIAGNOSIS — M47816 Spondylosis without myelopathy or radiculopathy, lumbar region: Secondary | ICD-10-CM

## 2023-03-18 ENCOUNTER — Encounter: Payer: Self-pay | Admitting: Internal Medicine

## 2023-03-18 ENCOUNTER — Ambulatory Visit: Payer: Medicare PPO | Admitting: Internal Medicine

## 2023-03-18 VITALS — BP 171/60 | HR 53 | Ht 62.0 in | Wt 199.6 lb

## 2023-03-18 DIAGNOSIS — E7800 Pure hypercholesterolemia, unspecified: Secondary | ICD-10-CM

## 2023-03-18 DIAGNOSIS — I5042 Chronic combined systolic (congestive) and diastolic (congestive) heart failure: Secondary | ICD-10-CM

## 2023-03-18 DIAGNOSIS — I2510 Atherosclerotic heart disease of native coronary artery without angina pectoris: Secondary | ICD-10-CM

## 2023-03-18 DIAGNOSIS — Z95810 Presence of automatic (implantable) cardiac defibrillator: Secondary | ICD-10-CM

## 2023-03-18 DIAGNOSIS — I10 Essential (primary) hypertension: Secondary | ICD-10-CM

## 2023-03-18 DIAGNOSIS — I255 Ischemic cardiomyopathy: Secondary | ICD-10-CM

## 2023-03-18 NOTE — Progress Notes (Signed)
Primary Physician:  Teena Irani, PA-C   [name], female    DOB: 1941-10-17, 82 y.o.   MRN: [id number]   Date: 03/18/23 Last Office Visit: 05/24/2021   Subjective:    Chief Complaint  Patient presents with   Coronary Artery Disease   Follow-up    HPI: Sara Graves  is a 82 y.o. female  with hypertension, insulin-dependent diabetes mellitus type 2,  CAD s/p prior LAD and ramus PCI,  HFrEF, ischemic cardiomyopathy, s/p BiV ICD implantation 06/2019,  h/o Rt leg cellulitis and metatarsal fracture (managed by Podiatry) with chief complaint of " 3 month followup for congestive heart failure."   Patient is here for a follow-up visit. She has been feeling well since the last time she was here. Patient is compliant with her medications. Her BP is very high today, however, this is typical for her when she comes to out office. Patient does have very well controlled pressures at home as her husband checks her blood pressure every day when he checks his own. Her SBP is never above 140 and usually she is in 120-130s range. Denies chest pain, shortness of breath, palpitations, diaphoresis, syncope, edema, PND, orthopnea.   Past Medical History:  Diagnosis Date   Acute combined systolic and diastolic heart failure (HCC) 04/25/2018   Anxiety    Arthritis    "hands" (12/29/2017)   Basal cell carcinoma of nose    removed   Bursitis of left shoulder    BV ICD Medtronic Claria MRI CRTD 06/21/2019   Cat scratch fever    Late 90s   Cataract, nuclear sclerotic, both eyes 05/13/2021   Chronic combined systolic and diastolic CHF (congestive heart failure) (HCC) 06/08/2019   Coronary artery disease    Depression    Diabetes mellitus, type II, insulin dependent (HCC)    Encounter for assessment of implantable cardioverter-defibrillator (ICD) 09/28/2019   GERD (gastroesophageal reflux disease)    H. pylori infection 2008 and 1998   treated   Hyperlipidemia    Hypertension     Hypothyroidism    Low oxygen saturation    Macular degeneration    Multinodular goiter    Rotator cuff tear, left recurrent    Urge urinary incontinence    UTI (lower urinary tract infection) 05/2016   Family History  Problem Relation Age of Onset   Cancer Mother        breast   Heart Problems Mother    Prostate cancer Father    Muscular dystrophy Son    Lung cancer Son    Cancer Maternal Grandmother        breast   Heart disease Maternal Grandfather    Breast cancer Sister    Past Surgical History:  Procedure Laterality Date   APPENDECTOMY  AGE 89   BACK SURGERY     BASAL CELL CARCINOMA EXCISION     "nose"   BIV ICD INSERTION CRT-D N/A 06/21/2019   Procedure: BIV ICD INSERTION CRT-D;  Surgeon: Marinus Maw, MD;  Location: [facility] INVASIVE CV LAB;  Service: Cardiovascular;  Laterality: N/A;   BLADDER NECK SUSPENSION  1970's   BUNIONECTOMY WITH HAMMERTOE RECONSTRUCTION Bilateral    CARPAL TUNNEL RELEASE Right 05/2017   COLONOSCOPY W/ POLYPECTOMY     CORONARY ANGIOPLASTY WITH STENT PLACEMENT  12/29/2017   CORONARY BALLOON ANGIOPLASTY N/A 02/08/2019   Procedure: CORONARY BALLOON ANGIOPLASTY;  Surgeon: Yates Decamp, MD;  Location: [facility] INVASIVE CV LAB;  Service: Cardiovascular;  Laterality: N/A;   CORONARY STENT INTERVENTION N/A 12/29/2017   Procedure: CORONARY STENT INTERVENTION;  Surgeon: Elder Negus, MD;  Location: [facility] INVASIVE CV LAB;  Service: Cardiovascular;  Laterality: N/A;   CORONARY STENT INTERVENTION N/A 04/26/2018   Procedure: CORONARY STENT INTERVENTION;  Surgeon: Elder Negus, MD;  Location: [facility] INVASIVE CV LAB;  Service: Cardiovascular;  Laterality: N/A;   CORONARY STENT INTERVENTION N/A 02/08/2019   Procedure: CORONARY STENT INTERVENTION;  Surgeon: Yates Decamp, MD;  Location: [facility] INVASIVE CV LAB;  Service: Cardiovascular;  Laterality: N/A;   ESOPHAGOGASTRODUODENOSCOPY ENDOSCOPY     FORAMINAL DECOMPRESSION AT L2 TO THE SACRUM  01-05-2008   L2  -  S1   GANGLION  CYST EXCISION Left 01/17/2009   ring finger   IMPLANTATION PERMANENT SPINAL CORD STIMULATOR  06-15-2008   JUNE 2013--  BATTERY CHANGE   JOINT REPLACEMENT     LEFT HEART CATH AND CORONARY ANGIOGRAPHY N/A 04/26/2018   Procedure: LEFT HEART CATH AND CORONARY ANGIOGRAPHY;  Surgeon: Elder Negus, MD;  Location: [facility] IN[facility]ASIVE CV LAB;  Service: Cardiovascular;  Laterality: N/A;   LEFT HEART CATH AND CORONARY ANGIOGRAPHY N/A 02/08/2019   Procedure: LEFT HEART CATH AND CORONARY ANGIOGRAPHY;  Surgeon: Yates Decamp, MD;  Location: [facility]ASIVE CV LAB;  Service: Cardiovascular;  Laterality: N/A;   LIPOMA EXCISION Right    RIGHT ELBOW   METATARSAL HEAD EXCISION Right 06/16/2018   Procedure: right 5th metatarsal excision, a mini c-arm;  Surgeon: Vivi Barrack, DPM;  Location: [facility];  Service: Podiatry;  Laterality: Right;  anesthesia can do block   RIGHT/LEFT HEART CATH AND CORONARY ANGIOGRAPHY N/A 12/29/2017   Procedure: RIGHT/LEFT HEART CATH AND CORONARY ANGIOGRAPHY;  Surgeon: Elder Negus, MD;  Location: [facility]VASIVE CV LAB;  Service: Cardiovascular;  Laterality: N/A;   SHOULDER OPEN ROTATOR CUFF REPAIR  09/07/2012   Procedure: ROTATOR CUFF REPAIR SHOULDER OPEN;  Surgeon: Drucilla Schmidt, MD;  Location: [facility];  Service: Orthopedics;  Laterality: Left;  OPEN ANTERIOR ACROMIONECTOMY AND ROTATOR CUFF REPAIR ON LEFT    SHOULDER OPEN ROTATOR CUFF REPAIR Left 12/28/2012   Procedure: ROTATOR CUFF REPAIR SHOULDER OPEN;  Surgeon: Drucilla Schmidt, MD;  Location: [facility];  Service: Orthopedics;  Laterality: Left;  OPEN SHOULDER ROTATOR CUFF REPAIR ON LEFT  WITH ANTERIOR ACROMINECTOMY    SHOULDER OPEN ROTATOR CUFF REPAIR Right 03/28/2003   RIGHT SHOULDER  DEGENERATIVE AC JOINT AND RC TEAR   SPINAL CORD STIMULATOR BATTERY EXCHANGE N/A 07/03/2016   Procedure: SPINAL CORD STIMULATOR BATTERY PLACMENT;  Surgeon: Venita Lick, MD;  Location: [facility];  Service:  Orthopedics;  Laterality: N/A;   TONSILLECTOMY  AGE 7   TOTAL KNEE ARTHROPLASTY Right 05/06/2000   OA RIGHT KNEE   VAGINAL HYSTERECTOMY  1970's   Social History   Tobacco Use   Smoking status: Never   Smokeless tobacco: Never  Vaping Use   Vaping Use: Never used  Substance Use Topics   Alcohol use: No   Drug use: No    Review of Systems  Constitutional: Negative for chills, decreased appetite and malaise/fatigue.  Cardiovascular:  Negative for chest pain, dyspnea on exertion, leg swelling, orthopnea and syncope.       Left arm swelling  Respiratory:  Negative for cough.   Endocrine: Negative for cold intolerance.  Hematologic/Lymphatic: Does not bruise/bleed easily.  Musculoskeletal:  Negative for falls and joint swelling.  Gastrointestinal:  Negative for abdominal pain, anorexia, change in bowel  habit, diarrhea and nausea.  Neurological:  Negative for headaches and light-headedness.  Psychiatric/Behavioral:  Negative for depression and substance abuse.   All other systems reviewed and are negative.     Objective:      03/18/2023    9:50 AM 03/18/2023    9:43 AM 09/17/2022   10:34 AM  Vitals with BMI  Height  5\' 2"  5\' 2"   Weight  199 lbs 10 oz 202 lbs  BMI  36.5 36.94  Systolic 171 183 161  Diastolic 60 71 70  Pulse 53 51 69   CONSTITUTIONAL: hemodynamically stable, no acute distress.  SKIN: Skin is warm and dry. No rash noted. No cyanosis. No pallor. No jaundice HEAD: Normocephalic and atraumatic.  CHEST Normal respiratory effort. No intercostal retractions.  BiV ICD site is clean dry and intact LUNGS: Clear to auscultation bilaterally.  No stridor. No wheezes. No rales.  CARDIOVASCULAR: Regular, positive S1-S2, soft holosystolic murmur heard at the apex, no gallops or rubs. Capillary refill 2-3 seconds ABDOMINAL: Soft, nontender, nondistended, positive bowel sounds in all 4 quadrants, no apparent ascites.  EXTREMITIES: trace bilateral ankle edema. Left arm mildly  swollen. Radial pulses 2+ bilaterally.    Laboratory examination:      Latest Ref Rng & Units 12/12/2021    1:16 PM 11/27/2021   10:33 AM 08/27/2021    2:36 PM  CMP  Glucose 70 - 99 mg/dL 096  97  045   BUN 8 - 27 mg/dL 28  20  40   Creatinine 0.57 - 1.00 mg/dL 4.09  8.11  9.14   Sodium 134 - 144 mmol/L 145  143  143   Potassium 3.5 - 5.2 mmol/L 3.8  3.5  4.1   Chloride 96 - 106 mmol/L 104  108  105   CO2 20 - 29 mmol/L 23  22  23    Calcium 8.7 - 10.3 mg/dL 9.1  8.5  78.2       Latest Ref Rng & Units 06/07/2021   11:53 AM 02/12/2021    3:19 AM 02/11/2021    5:55 PM  CBC  WBC 4.0 - 10.5 K/uL 5.0  4.1  4.4   Hemoglobin 12.0 - 15.0 g/dL 95.6  21.3  08.6   Hematocrit 36.0 - 46.0 % 36.6  36.5  41.0   Platelets 150 - 400 K/uL 102  92  94    Lipid Panel  Lab Results  Component Value Date   CHOL 203 (H) 06/03/2021   HDL 68 06/03/2021   LDLCALC 119 (H) 06/03/2021   LDLDIRECT 112 (H) 06/03/2021   TRIG 88 06/03/2021   CHOLHDL 2.9 12/26/2019    HEMOGLOBIN A1C Lab Results  Component Value Date   HGBA1C 9.8 (H) 02/12/2021   MPG 234.56 02/12/2021   TSH No results for input(s): "TSH" in the last 8760 hours. BNP (last 3 results) No results for input(s): "BNP" in the last 8760 hours.   ProBNP (last 3 results) No results for input(s): "PROBNP" in the last 8760 hours.  PRN Meds:. There are no discontinued medications.    Current Meds  Medication Sig   alendronate (FOSAMAX) 70 MG tablet Take 1 tablet (70 mg total) by mouth once a week.   aspirin EC 81 MG tablet Take 81 mg by mouth daily.   busPIRone (BUSPAR) 5 MG tablet Take 5 mg by mouth 2 (two) times daily. As needed   Calcium Carb-Cholecalciferol (CALCIUM 600 + D PO) Take by mouth.   dapagliflozin propanediol (  FARXIGA) 10 MG TABS tablet Take 1 tablet (10 mg total) by mouth daily.   denosumab (XGEVA) 120 MG/1.7ML SOLN injection Inject 120 mg into the skin.   ezetimibe (ZETIA) 10 MG tablet Take 10 mg by mouth daily.    gabapentin (NEURONTIN) 100 MG capsule Take 100 mg by mouth at bedtime.   insulin aspart (NOVOLOG FLEXPEN) 100 UNIT/ML FlexPen Inject into the skin.   insulin degludec (TRESIBA) 100 UNIT/ML FlexTouch Pen Inject into the skin.   isosorbide dinitrate (ISORDIL) 40 MG tablet TAKE 1 TABLET BY MOUTH 3 TIMES DAILY.   levothyroxine (SYNTHROID, LEVOTHROID) 50 MCG tablet Take 50 mcg by mouth daily before breakfast.    Magnesium Oxide 500 MG TABS TAKE 1 TABLET (500 MG TOTAL) BY MOUTH IN THE MORNING AND AT BEDTIME. (Patient taking differently: Take 500 mg by mouth in the morning, at noon, and at bedtime.)   methocarbamol (ROBAXIN) 500 MG tablet Take 500 mg by mouth 4 (four) times daily.   metoprolol succinate (TOPROL-XL) 50 MG 24 hr tablet TAKE 1 TABLET BY MOUTH EVERY DAY WITH OR IMMEDIATELY FOLLOWING A MEAL   nitroGLYCERIN (NITROSTAT) 0.4 MG SL tablet PLACE 1 TABLET UNDER THE TONGUE EVERY 5 MINUTES X 3 DOSES AS NEEDED FOR CHEST PAIN. (Patient taking differently: Place 0.4 mg under the tongue every 5 (five) minutes as needed for chest pain.)   ONETOUCH VERIO test strip 1 each by Other route 3 (three) times daily.   rosuvastatin (CRESTOR) 40 MG tablet Take 1 tablet (40 mg total) by mouth at bedtime.   sacubitril-valsartan (ENTRESTO) 97-103 MG Take 1 tablet by mouth 2 (two) times daily.   torsemide (DEMADEX) 20 MG tablet Take 20 mg by mouth every other day.   vitamin B-12 (CYANOCOBALAMIN) 1000 MCG tablet Take 1,000 mcg by mouth daily.    Vitamin D, Ergocalciferol, (DRISDOL) 50000 units CAPS capsule Take 50,000 Units by mouth every 7 (seven) days. Take on Thursdays    Radiology:  No results found.  Cardiac Studies:   EKG: 09/17/2022: ventricular paced rhythm, functioning appropriately 08/27/2021: Atrial and ventricular paced rhythm. 03/17/2022: Underlying sinus rhythm with ventricular pacing at a rate of 54 bpm   Echocardiogram: 02/12/2021: LVEF 20-25%, severely reduced left ventricular systolic function,  global hypokinesis, LV size moderately dilated, mild LVH, right ventricular systolic function moderately reduced, RV size mildly enlarged, PASP 68 mmHg, severe left atrial dilatation, moderately dilated right atrium, moderate to severe MR, moderate to severe TR, mild AR, IVC dilated estimated RAP 15 mmHg.   Stress test: None   Heart catheterization: Coronary angiogram 02/08/2019:  Distal left main 20% diffuse disease.  Ostial LAD stent shows diffuse 20% in-stent restenosis (3.0 x 15 mm resolute Onyx on 12/29/2017).  Mid LAD and mid to distal LAD there are tandem lesions 80% and 95%.  Ramus intermediate ostial 95 to 99% stenosis.  Previously placed RI stent widely patent ( 2.0 x 12 mm resolute on 04/26/2018).  30 to 40% tandem lesions in the RCA. Cutting Balloon angioplasty of the ramus intermediate ostial high-grade stenosis, 99% reduced to 0% and Cutting Balloon angioplasty of the mid LAD followed by stenting with 2.5 x 32 mm Synergy DES for high-grade 90% stenosis reduced to 0% and TIMI-3 to TIMI-3 flow maintained in both lesions.   Carotid artery duplex 12/02/2018: No hemodynamically significant arterial disease in the internal carotid artery bilaterally. Minimal plaque noted bilateral carotid arteries. Antegrade right vertebral artery flow. Antegrade left vertebral artery flow.  Remote CRT-D transmission 11/10/2021: AP  57%, CRT 97%. Longevity 7 years and 6 months. Lead impedance and thresholds within normal limits. There were no high ventricular rate episodes, no mode switches. Thoracic impedance is at baseline and does not suggest volume overload state. Normal function.   Assessment:     ICD-10-CM   1. Essential hypertension  I10     2. ICD Medtronic Claria MRI CRTD 06/21/2019  Z95.810     3. Pure hypercholesterolemia  E78.00     4. Chronic combined systolic and diastolic heart failure (HCC)  Z61.09     5. Ischemic cardiomyopathy  I25.5     6. Coronary artery disease involving  native coronary artery of native heart without angina pectoris  I25.10        Recommendations:   Chronic combined systolic and diastolic CHF, NYHA class 3 (HCC) Patient is relatively asymptomatic at this time Blood pressure is elevated in the office today, however it is well controlled on home monitoring Continue Farxiga, Isordil, Entresto, metoprolol, torsemide   Ischemic cardiomyopathy Patient remained stable without evidence of acute decompensated heart failure at today's office visit. Management as per above. Status post coronary intervention and CRT-D.   Essential hypertension Continue current cardiac medications. Her BP is always elevated when she comes here but her husband checks her pressure at home and she runs 120-130s SBP Encourage low-sodium diet, less than 2000 mg daily.   Pure hypercholesterolemia Continue statin and Zetia.    Biventricular ICD (implantable cardioverter-defibrillator) in place We will continue to monitor   Coronary artery disease involving native coronary artery of native heart without angina pectoris Mains chest pain-free Continue present medical therapy including aspirin, statin, Zetia     Current Outpatient Medications:    alendronate (FOSAMAX) 70 MG tablet, Take 1 tablet (70 mg total) by mouth once a week., Disp: 4 tablet, Rfl: 1   aspirin EC 81 MG tablet, Take 81 mg by mouth daily., Disp: , Rfl:    busPIRone (BUSPAR) 5 MG tablet, Take 5 mg by mouth 2 (two) times daily. As needed, Disp: , Rfl:    Calcium Carb-Cholecalciferol (CALCIUM 600 + D PO), Take by mouth., Disp: , Rfl:    dapagliflozin propanediol (FARXIGA) 10 MG TABS tablet, Take 1 tablet (10 mg total) by mouth daily., Disp: 90 tablet, Rfl: 3   denosumab (XGEVA) 120 MG/1.7ML SOLN injection, Inject 120 mg into the skin., Disp: , Rfl:    ezetimibe (ZETIA) 10 MG tablet, Take 10 mg by mouth daily., Disp: , Rfl:    gabapentin (NEURONTIN) 100 MG capsule, Take 100 mg by mouth at  bedtime., Disp: , Rfl:    insulin aspart (NOVOLOG FLEXPEN) 100 UNIT/ML FlexPen, Inject into the skin., Disp: , Rfl:    insulin degludec (TRESIBA) 100 UNIT/ML FlexTouch Pen, Inject into the skin., Disp: , Rfl:    isosorbide dinitrate (ISORDIL) 40 MG tablet, TAKE 1 TABLET BY MOUTH 3 TIMES DAILY., Disp: 270 tablet, Rfl: 1   levothyroxine (SYNTHROID, LEVOTHROID) 50 MCG tablet, Take 50 mcg by mouth daily before breakfast. , Disp: , Rfl:    Magnesium Oxide 500 MG TABS, TAKE 1 TABLET (500 MG TOTAL) BY MOUTH IN THE MORNING AND AT BEDTIME. (Patient taking differently: Take 500 mg by mouth in the morning, at noon, and at bedtime.), Disp: 180 tablet, Rfl: 1   methocarbamol (ROBAXIN) 500 MG tablet, Take 500 mg by mouth 4 (four) times daily., Disp: , Rfl:    metoprolol succinate (TOPROL-XL) 50 MG 24 hr tablet, TAKE 1 TABLET BY  MOUTH EVERY DAY WITH OR IMMEDIATELY FOLLOWING A MEAL, Disp: 90 tablet, Rfl: 0   nitroGLYCERIN (NITROSTAT) 0.4 MG SL tablet, PLACE 1 TABLET UNDER THE TONGUE EVERY 5 MINUTES X 3 DOSES AS NEEDED FOR CHEST PAIN. (Patient taking differently: Place 0.4 mg under the tongue every 5 (five) minutes as needed for chest pain.), Disp: 25 tablet, Rfl: 2   ONETOUCH VERIO test strip, 1 each by Other route 3 (three) times daily., Disp: , Rfl: 5   rosuvastatin (CRESTOR) 40 MG tablet, Take 1 tablet (40 mg total) by mouth at bedtime., Disp: 90 tablet, Rfl: 3   sacubitril-valsartan (ENTRESTO) 97-103 MG, Take 1 tablet by mouth 2 (two) times daily., Disp: 180 tablet, Rfl: 3   torsemide (DEMADEX) 20 MG tablet, Take 20 mg by mouth every other day., Disp: , Rfl:    vitamin B-12 (CYANOCOBALAMIN) 1000 MCG tablet, Take 1,000 mcg by mouth daily. , Disp: , Rfl:    Vitamin D, Ergocalciferol, (DRISDOL) 50000 units CAPS capsule, Take 50,000 Units by mouth every 7 (seven) days. Take on Thursdays, Disp: , Rfl:    glucose blood test strip, 1 each 3 (three) times daily. , Disp: , Rfl:   No orders of the defined types were  placed in this encounter.     Clotilde Dieter, DO, [facility] 03/18/2023, 10:22 AM Office: [phone]

## 2023-03-23 ENCOUNTER — Ambulatory Visit
Admission: RE | Admit: 2023-03-23 | Discharge: 2023-03-23 | Disposition: A | Discharge status: Home / Self Care | Payer: Medicare PPO | Source: Ambulatory Visit | Attending: Nurse Practitioner | Admitting: Nurse Practitioner

## 2023-03-23 DIAGNOSIS — Z9889 Other specified postprocedural states: Secondary | ICD-10-CM

## 2023-03-23 DIAGNOSIS — M47816 Spondylosis without myelopathy or radiculopathy, lumbar region: Secondary | ICD-10-CM

## 2023-03-23 MED ORDER — ONDANSETRON HCL 4 MG/2ML IJ SOLN: 4.0000 mg | Freq: Once | INTRAMUSCULAR | Status: DC | PRN

## 2023-03-23 MED ORDER — MEPERIDINE HCL 50 MG/ML IJ SOLN: 50.0000 mg | Freq: Once | INTRAMUSCULAR | Status: DC | PRN

## 2023-03-23 MED ORDER — IOPAMIDOL (ISOVUE-M 300) INJECTION 61%
10.0000 mL | Freq: Once | INTRAMUSCULAR | Status: AC
  Administered 2023-03-23: 10 mL via INTRATHECAL

## 2023-03-23 MED ORDER — DIAZEPAM 5 MG PO TABS
5.0000 mg | ORAL_TABLET | Freq: Once | ORAL | Status: AC
  Administered 2023-03-23: 5 mg via ORAL

## 2023-03-23 NOTE — Discharge Instructions (Signed)
Myelogram Discharge Instructions  1. Go home and rest quietly as needed. You may resume normal activities; however, do not exert yourself strongly or do any heavy lifting today and tomorrow.   2. DO NOT drive today.    3. You may resume your normal diet and medications unless otherwise indicated. Drink lots of extra fluids today and tomorrow.   4. The incidence of headache, nausea, or vomiting is about 5% (one in 20 patients).  If you develop a headache, lie flat for 24 hours and drink plenty of fluids until the headache goes away.  Caffeinated beverages may be helpful. If when you get up you still have a headache when standing, go back to bed and force fluids for another 24 hours.   5. If you develop severe nausea and vomiting or a headache that does not go away with the flat bedrest after 48 hours, please call [phone].   6. Call your physician for a follow-up appointment.  The results of your myelogram will be sent directly to your physician by the following day.  7. If you have any questions or if complications develop after you arrive home, please call [phone].  Discharge instructions have been explained to the patient.  The patient, or the person responsible for the patient, fully understands these instructions.   Thank you for visiting our office today.  

## 2023-07-27 ENCOUNTER — Encounter: Payer: Self-pay | Admitting: Podiatry

## 2023-07-27 ENCOUNTER — Ambulatory Visit: Payer: Medicare PPO | Admitting: Podiatry

## 2023-07-27 DIAGNOSIS — M79675 Pain in left toe(s): Secondary | ICD-10-CM | POA: Diagnosis not present

## 2023-07-27 DIAGNOSIS — B351 Tinea unguium: Secondary | ICD-10-CM

## 2023-07-27 DIAGNOSIS — M79674 Pain in right toe(s): Secondary | ICD-10-CM | POA: Diagnosis not present

## 2023-07-28 NOTE — Progress Notes (Signed)
Subjective:   [name], female   DOB: 82 y.o.   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

## 2023-08-12 ENCOUNTER — Telehealth: Payer: Self-pay

## 2023-08-12 NOTE — Telephone Encounter (Signed)
Patient is now in Heart Care from [facility]. Dr. Ladona Ridgel has assumed care although not seen patient in clinic.   Following alert received from [employer] received for 32 AF/AFL events, longest duration , ASA only per EPIC, burden 0.7%.  Hx of PAF noted in EPIC.  Route to triage to clarify no OAC status.  Dr. Ladona Ridgel, would you like patient to see you first or refer to AF clinic? Questionable hx of PAF. Also, atrial rates are not suggestive of AF.

## 2023-08-27 NOTE — Telephone Encounter (Signed)
Clearly afib. Send her to our afib clinic to discuss initiation of eliquis.

## 2023-08-28 NOTE — Telephone Encounter (Signed)
Patient called, advised AF noted on device and Dr. Ladona Ridgel recommends AF clinic to discuss [facility]. Patient agreeable.

## 2023-09-01 ENCOUNTER — Encounter (HOSPITAL_COMMUNITY): Payer: Self-pay | Admitting: Internal Medicine

## 2023-09-01 ENCOUNTER — Ambulatory Visit (HOSPITAL_COMMUNITY)
Admission: RE | Admit: 2023-09-01 | Discharge: 2023-09-01 | Disposition: A | Discharge status: Home / Self Care | Payer: Medicare PPO | Source: Ambulatory Visit | Attending: Internal Medicine | Admitting: Internal Medicine

## 2023-09-01 ENCOUNTER — Telehealth: Payer: Self-pay

## 2023-09-01 VITALS — BP 144/74 | HR 67 | Ht 62.0 in | Wt 196.8 lb

## 2023-09-01 DIAGNOSIS — Z7901 Long term (current) use of anticoagulants: Secondary | ICD-10-CM | POA: Insufficient documentation

## 2023-09-01 DIAGNOSIS — I2510 Atherosclerotic heart disease of native coronary artery without angina pectoris: Secondary | ICD-10-CM | POA: Diagnosis not present

## 2023-09-01 DIAGNOSIS — I5020 Unspecified systolic (congestive) heart failure: Secondary | ICD-10-CM | POA: Diagnosis not present

## 2023-09-01 DIAGNOSIS — I4891 Unspecified atrial fibrillation: Secondary | ICD-10-CM | POA: Diagnosis not present

## 2023-09-01 DIAGNOSIS — I110 Hypertensive heart disease with heart failure: Secondary | ICD-10-CM | POA: Insufficient documentation

## 2023-09-01 DIAGNOSIS — E119 Type 2 diabetes mellitus without complications: Secondary | ICD-10-CM | POA: Insufficient documentation

## 2023-09-01 DIAGNOSIS — I480 Paroxysmal atrial fibrillation: Secondary | ICD-10-CM | POA: Insufficient documentation

## 2023-09-01 DIAGNOSIS — D6869 Other thrombophilia: Secondary | ICD-10-CM | POA: Insufficient documentation

## 2023-09-01 MED ORDER — APIXABAN 5 MG PO TABS: 5.0000 mg | ORAL_TABLET | Freq: Two times a day (BID) | ORAL | 3 refills | Status: DC

## 2023-09-01 NOTE — Progress Notes (Signed)
Primary Care Physician: Sara Gobble, PA-C Primary Cardiologist: None Electrophysiologist: None     Referring Physician: Dr. [name] is a 82 y.o. female with a history of HTN, T2DM, CAD s/p prior LAD and ramus PCI, HFrEF, ischemic cardiomyopathy, s/p BiV ICD implantation 06/2019, and paroxysmal atrial fibrillation who presents for consultation in the [facility] Atrial [facility]ibr[facility].  The patient was alerted by device clinic on 9/25 for episodes of PAF; recommended by Dr. Ladona Ridgel to discuss [facility]. Patient has a CHADS2VASC score of 7.  On evaluation today, she is currently in NSR. She could not feel the episodes in question when she received phone call. She takes ASA daily.   Today, she denies symptoms of palpitations, chest pain, shortness of breath, orthopnea, PND, lower extremity edema, dizziness, presyncope, syncope, snoring, daytime somnolence, bleeding, or neurologic sequela. The patient is tolerating medications without difficulties and is otherwise without complaint today.    she has a BMI of Body mass index is 36 kg/m.Marland Kitchen Filed Weights   09/01/23 0840  Weight: 89.3 kg    Current Outpatient Medications  Medication Sig Dispense Refill   alendronate (FOSAMAX) 70 MG tablet Take 1 tablet (70 mg total) by mouth once a week. 4 tablet 1   apixaban (ELIQUIS) 5 MG TABS tablet Take 1 tablet (5 mg total) by mouth 2 (two) times daily. 60 tablet 3   aspirin EC 81 MG tablet Take 81 mg by mouth daily.     busPIRone (BUSPAR) 5 MG tablet Take 5 mg by mouth 2 (two) times daily. As needed     Calcium Carb-Cholecalciferol (CALCIUM 600 + D PO) Take by mouth.     dapagliflozin propanediol (FARXIGA) 10 MG TABS tablet Take 1 tablet (10 mg total) by mouth daily. 90 tablet 3   denosumab (XGEVA) 120 MG/1.7ML SOLN injection Inject 120 mg into the skin.     ezetimibe (ZETIA) 10 MG tablet Take 10 mg by mouth daily.     gabapentin (NEURONTIN) 100 MG capsule Take 100 mg by  mouth at bedtime.     glucose blood test strip 1 each 3 (three) times daily.      insulin aspart (NOVOLOG FLEXPEN) 100 UNIT/ML FlexPen Inject into the skin.     insulin degludec (TRESIBA) 100 UNIT/ML FlexTouch Pen Inject into the skin.     isosorbide dinitrate (ISORDIL) 40 MG tablet TAKE 1 TABLET BY MOUTH 3 TIMES DAILY. 270 tablet 1   levothyroxine (SYNTHROID, LEVOTHROID) 50 MCG tablet Take 50 mcg by mouth daily before breakfast.      Magnesium Oxide 500 MG TABS TAKE 1 TABLET (500 MG TOTAL) BY MOUTH IN THE MORNING AND AT BEDTIME. (Patient taking differently: Take 500 mg by mouth in the morning, at noon, and at bedtime.) 180 tablet 1   methocarbamol (ROBAXIN) 500 MG tablet Take 500 mg by mouth 4 (four) times daily.     metoprolol succinate (TOPROL-XL) 50 MG 24 hr tablet TAKE 1 TABLET BY MOUTH EVERY DAY WITH OR IMMEDIATELY FOLLOWING A MEAL 90 tablet 0   nitroGLYCERIN (NITROSTAT) 0.4 MG SL tablet PLACE 1 TABLET UNDER THE TONGUE EVERY 5 MINUTES X 3 DOSES AS NEEDED FOR CHEST PAIN. (Patient taking differently: Place 0.4 mg under the tongue every 5 (five) minutes as needed for chest pain.) 25 tablet 2   ONETOUCH VERIO test strip 1 each by Other route 3 (three) times daily.  5   sacubitril-valsartan (ENTRESTO) 97-103 MG Take 1  tablet by mouth 2 (two) times daily. 180 tablet 3   torsemide (DEMADEX) 20 MG tablet Take 20 mg by mouth every other day.     vitamin B-12 (CYANOCOBALAMIN) 1000 MCG tablet Take 1,000 mcg by mouth daily.      Vitamin D, Ergocalciferol, (DRISDOL) 50000 units CAPS capsule Take 50,000 Units by mouth every 7 (seven) days. Take on Thursdays     rosuvastatin (CRESTOR) 40 MG tablet Take 1 tablet (40 mg total) by mouth at bedtime. 90 tablet 3   No current facility-administered medications for this encounter.    Atrial Fibrillation Management history:  Previous antiarrhythmic drugs: none Previous cardioversions: none Previous ablations: none Anticoagulation history: none   ROS- All  systems are reviewed and negative except as per the HPI above.  Physical Exam: BP (!) 144/74   Pulse 67   Ht 5\' 2"  (1.575 m)   Wt 89.3 kg   BMI 36.00 kg/m   GEN: Well nourished, well developed in no acute distress NECK: No JVD; No carotid bruits CARDIAC: Regular rate and rhythm, no murmurs, rubs, gallops RESPIRATORY:  Clear to auscultation without rales, wheezing or rhonchi  ABDOMEN: Soft, non-tender, non-distended EXTREMITIES:  No edema; No deformity   EKG today demonstrates  Vent. rate 67 BPM PR interval 152 ms QRS duration 128 ms QT/QTcB 424/448 ms P-R-T axes 24 16 174 Atrial-sensed ventricular-paced rhythm Abnormal ECG When compared with ECG of 07-Jun-2021 11:35, PREVIOUS ECG IS PRESENT  Echo 03/26/22 demonstrated  Echocardiogram 03/26/2022:  Left ventricle cavity is normal in size. Mild concentric hypertrophy of  the left ventricle. Normal global wall motion. Normal LV systolic function  with EF 63%. Diastolic function not assessed due to BiV paced rhythm.  Left atrial cavity is moderately dilated.  Structurally normal trileaflet aortic valve.  Mild (Grade I) aortic  regurgitation.  Moderate (Grade II) mitral regurgitation.  Mild tricuspid regurgitation. Estimated pulmonary artery systolic pressure  34 mmHg.  Compared to previous study on 11/30/2019, estimated PASP reduced from 64  mmHg.   ASSESSMENT & PLAN CHA2DS2-VASc Score = 7  The patient's score is based upon: CHF History: 1 HTN History: 1 Diabetes History: 1 Stroke History: 0 Vascular Disease History: 1 Age Score: 2 Gender Score: 1       ASSESSMENT AND PLAN: Paroxysmal Atrial Fibrillation (ICD10:  I48.0) The patient's CHA2DS2-VASc score is 7, indicating a 11.2% annual risk of stroke.    Education provided about Afib. Discussion about anticoagulation due to risk for stroke secondary to arrhythmia. After discussion of risks vs benefits, she elects to begin anticoagulation. Will require CBC in 1 month.  Discussion with cardiologist and at this time with no prior history of bleeding will continue ASA 81 mg. Precautions advised for patient to look for signs of bleeding.   Will schedule f/u with Dr. Ladona Ridgel for annual device visit.   Secondary Hypercoagulable State (ICD10:  D68.69) The patient is at significant risk for stroke/thromboembolism based upon her CHA2DS2-VASc Score of 7.  Start Apixaban (Eliquis).  Start Eliquis 5 mg BID. This is correct dose given patient weighs > 60 kg and most recent creatinine at [facility] facility was 0.96.     Follow up as scheduled with Dr. Odis Hollingshead. F/u Afib clinic prn.   Sara Bells, PA-C  Afib Clinic [facility] [address] [place], [place] [address] [phone]

## 2023-09-01 NOTE — Patient Instructions (Addendum)
Start Eliquis 5mg  twice a day

## 2023-09-01 NOTE — Telephone Encounter (Signed)
Unscheduled manual transmission received.  Normal device function. 9 AT/AF episodes, burden 0.7%. longest 1 hr 46 min on 08/16/23, not on [facility] per Epic with appt with AF Clinic on 09/01/23. Routed to clinic for update.

## 2023-09-18 ENCOUNTER — Ambulatory Visit: Payer: Self-pay | Admitting: Cardiology

## 2023-10-06 ENCOUNTER — Ambulatory Visit: Payer: Medicare PPO | Attending: Cardiology | Admitting: Cardiology

## 2023-10-06 ENCOUNTER — Encounter: Payer: Self-pay | Admitting: Cardiology

## 2023-10-06 VITALS — BP 140/70 | HR 68 | Resp 16 | Ht 62.0 in | Wt 198.0 lb

## 2023-10-06 DIAGNOSIS — Z95810 Presence of automatic (implantable) cardiac defibrillator: Secondary | ICD-10-CM

## 2023-10-06 DIAGNOSIS — I5032 Chronic diastolic (congestive) heart failure: Secondary | ICD-10-CM

## 2023-10-06 DIAGNOSIS — I4891 Unspecified atrial fibrillation: Secondary | ICD-10-CM

## 2023-10-06 DIAGNOSIS — I2510 Atherosclerotic heart disease of native coronary artery without angina pectoris: Secondary | ICD-10-CM | POA: Diagnosis not present

## 2023-10-06 DIAGNOSIS — E7800 Pure hypercholesterolemia, unspecified: Secondary | ICD-10-CM

## 2023-10-06 DIAGNOSIS — I10 Essential (primary) hypertension: Secondary | ICD-10-CM

## 2023-10-06 DIAGNOSIS — R0989 Other specified symptoms and signs involving the circulatory and respiratory systems: Secondary | ICD-10-CM

## 2023-10-06 MED ORDER — APIXABAN 5 MG PO TABS: 5.0000 mg | ORAL_TABLET | Freq: Two times a day (BID) | ORAL | 3 refills | Status: AC

## 2023-10-06 MED ORDER — NITROGLYCERIN 0.4 MG SL SUBL
0.4000 mg | SUBLINGUAL_TABLET | SUBLINGUAL | 0 refills | Status: DC | PRN
Start: 2023-10-06 — End: 2024-05-31

## 2023-10-06 NOTE — Progress Notes (Signed)
Cardiology Office Note:  .   Date:  10/06/2023  ID:  Sara Graves, DOB [birth year], MRN [id number] PCP:  Marella Bile  Former Cardiology Providers: Dr. Yates Decamp  HeartCare Providers Cardiologist:  Tessa Lerner, DO , [facility]  Electrophysiologist:  None  Click to update primary MD,subspecialty MD or APP then REFRESH:1}    Chief Complaint  Patient presents with   Follow-up    6 month   Atrial Fibrillation    History of Present Illness: .   Sara Graves is a 82 y.o. Caucasian female whose past medical history and cardiovascular risk factors includes: Paroxysmal atrial fibrillation (detected September 2024), hypertension, insulin-dependent diabetes mellitus type 2,  CAD s/p prior LAD and ramus PCI,  HFimpEF, recovered ischemic cardiomyopathy, s/p BiV ICD implantation 06/2019,  h/o Rt leg cellulitis.  I had last seen the patient in October 2022 and since that she has been followed by physician assistants within the practice.  She presents today for follow-up after recently being diagnosed with paroxysmal atrial fibrillation via her BiV ICD.  In late September/early October 2024 she was noted to have paroxysmal episodes of A-fib on her device and was referred to A-fib clinic and started on Eliquis for thromboembolic prophylaxis.  Patient states that she is doing well on oral anticoagulation.  She was told that she has blood in the urine (microscopic).  She had a CT scan with urology in [place] and plans to continue her urological workup.  She is also had " a lot of urinary tract infections."  Clinically denies anginal chest pain or heart failure symptoms.  She ambulates with a cane.  Review of Systems: .   Review of Systems  Cardiovascular:  Negative for chest pain, claudication, irregular heartbeat, leg swelling, near-syncope, orthopnea, palpitations, paroxysmal nocturnal dyspnea and syncope.  Respiratory:  Negative for shortness of breath.   Hematologic/Lymphatic:  Negative for bleeding problem.  Genitourinary:        Microscopic hematuria (per patient)    Studies Reviewed:   EKG: EKG Interpretation Date/Time:  Tuesday October 06 2023 11:45:20 EST Text Interpretation: Atrial-sensed ventricular-paced rhythm When compared with ECG of 01-Sep-2023 08:49, Vent. rate has decreased BY   5 BPM Confirmed by Tessa Lerner [phone]) on 10/06/2023 12:13:59 PM  Echocardiogram: 02/12/2021: LVEF 20-25%, global hypokinesis, LV size dilated, right ventricular systolic function moderately reduced in size dilated, estimated RAP 68 mmHg, left severe LAE, moderate RAE, moderate to severe MR/TR see report for additional details  May 2023: LVEF 63%, mild AR, moderate MR, RVSP 34 mmHg.   Heart catheterization: Coronary angiogram 02/08/2019:  Distal left main 20% diffuse disease.  Ostial LAD stent shows diffuse 20% in-stent restenosis (3.0 x 15 mm resolute Onyx on 12/29/2017).  Mid LAD and mid to distal LAD there are tandem lesions 80% and 95%.  Ramus intermediate ostial 95 to 99% stenosis.  Previously placed RI stent widely patent ( 2.0 x 12 mm resolute on 04/26/2018).  30 to 40% tandem lesions in the RCA. Cutting Balloon angioplasty of the ramus intermediate ostial high-grade stenosis, 99% reduced to 0% and Cutting Balloon angioplasty of the mid LAD followed by stenting with 2.5 x 32 mm Synergy DES for high-grade 90% stenosis reduced to 0% and TIMI-3 to TIMI-3 flow maintained in both lesions.   Carotid artery duplex 12/02/2018: No hemodynamically significant arterial disease in the internal carotid artery bilaterally. Minimal plaque noted bilateral carotid arteries. Antegrade right vertebral artery flow. Antegrade left vertebral artery flow.  Remote CRT-D transmission 11/10/2021: AP 57%, CRT 97%. Longevity 7 years and 6 months. Lead impedance and thresholds within normal limits. There were no high ventricular rate episodes, no mode switches. Thoracic impedance is at baseline  and does not suggest volume overload state. Normal function.  RADIOLOGY: N/A  Risk Assessment/Calculations:   Click Here to Calculate/Change CHADS2VASc Score The patient's CHADS2-VASc score is 7, indicating a 11.2% annual risk of stroke.   CHF History: Yes HTN History: Yes Diabetes History: Yes Stroke History: No Vascular Disease History: Yes  Labs:       Latest Ref Rng & Units 06/07/2021   11:53 AM 02/12/2021    3:19 AM 02/11/2021    5:55 PM  CBC  WBC 4.0 - 10.5 K/uL 5.0  4.1  4.4   Hemoglobin 12.0 - 15.0 g/dL 03.5  00.9  38.1   Hematocrit 36.0 - 46.0 % 36.6  36.5  41.0   Platelets 150 - 400 K/uL 102  92  94        Latest Ref Rng & Units 12/12/2021    1:16 PM 11/27/2021   10:33 AM 08/27/2021    2:36 PM  BMP  Glucose 70 - 99 mg/dL 829  97  937   BUN 8 - 27 mg/dL 28  20  40   Creatinine 0.57 - 1.00 mg/dL 1.69  6.78  9.38   BUN/Creat Ratio 12 - 28 21  20  29    Sodium 134 - 144 mmol/L 145  143  143   Potassium 3.5 - 5.2 mmol/L 3.8  3.5  4.1   Chloride 96 - 106 mmol/L 104  108  105   CO2 20 - 29 mmol/L 23  22  23    Calcium 8.7 - 10.3 mg/dL 9.1  8.5  10.1       Latest Ref Rng & Units 12/12/2021    1:16 PM 11/27/2021   10:33 AM 08/27/2021    2:36 PM  CMP  Glucose 70 - 99 mg/dL 751  97  025   BUN 8 - 27 mg/dL 28  20  40   Creatinine 0.57 - 1.00 mg/dL 8.52  7.78  2.42   Sodium 134 - 144 mmol/L 145  143  143   Potassium 3.5 - 5.2 mmol/L 3.8  3.5  4.1   Chloride 96 - 106 mmol/L 104  108  105   CO2 20 - 29 mmol/L 23  22  23    Calcium 8.7 - 10.3 mg/dL 9.1  8.5  35.3     Lab Results  Component Value Date   CHOL 203 (H) 06/03/2021   HDL 68 06/03/2021   LDLCALC 119 (H) 06/03/2021   LDLDIRECT 112 (H) 06/03/2021   TRIG 88 06/03/2021   CHOLHDL 2.9 12/26/2019   No results for input(s): "LIPOA" in the last 8760 hours. No components found for: "NTPROBNP" No results for input(s): "PROBNP" in the last 8760 hours. No results for input(s): "TSH" in the last 8760  hours.  Physical Exam:    Today's Vitals   10/06/23 1143  BP: (!) 140/70  Pulse: 68  Resp: 16  SpO2: 90%  Weight: 198 lb (89.8 kg)  Height: 5\' 2"  (1.575 m)   Body mass index is 36.21 kg/m. Wt Readings from Last 3 Encounters:  10/06/23 198 lb (89.8 kg)  09/01/23 196 lb 12.8 oz (89.3 kg)  03/18/23 199 lb 9.6 oz (90.5 kg)    Physical Exam  Constitutional: No distress. She appears chronically ill.  hemodynamically stable, appears older than stated age,  Neck: No JVD present.  Cardiovascular: Normal rate, regular rhythm, S1 normal and S2 normal. Exam reveals no gallop, no S3 and no S4.  Murmur heard. Holosystolic murmur is present with a grade of 3/6 at the apex. Pulses:      Carotid pulses are  on the right side with bruit and  on the left side with bruit. Pulmonary/Chest: Effort normal and breath sounds normal. No stridor. She has no wheezes. She has no rales.  Device site is clean dry and intact.  Abdominal: Soft. Bowel sounds are normal. She exhibits no distension. There is no abdominal tenderness.  Musculoskeletal:        General: Edema (Trace bilateral) present.     Cervical back: Neck supple.  Neurological: She is alert and oriented to person, place, and time. She has intact cranial nerves (2-12).  Skin: Skin is warm.    Impression & Recommendation(s):  Impression:   ICD-10-CM   1. Heart failure with improved ejection fraction (HFimpEF) (HCC)  I50.32 EKG 12-Lead    Basic metabolic panel    Pro b natriuretic peptide (BNP)    Hemoglobin and hematocrit, blood    Hemoglobin and hematocrit, blood    Pro b natriuretic peptide (BNP)    Basic metabolic panel    2. Coronary artery disease involving native coronary artery of native heart without angina pectoris  I25.10 nitroGLYCERIN (NITROSTAT) 0.4 MG SL tablet    3. New onset a-fib (HCC)  I48.91 apixaban (ELIQUIS) 5 MG TABS tablet    4. Essential hypertension  I10     5. Pure hypercholesterolemia  E78.00     6.  Bilateral carotid bruits  R09.89 VAS US CAROTID    7. ICD Medtronic Claria MRI CRTD 06/21/2019  Z95.810        Recommendation(s):  New onset a-fib [facility]) Paroxysmal. Diagnosed by her device in late September/early October 2024 Rate control: Toprol-XL. Rhythm control: N/A. Thromboembolic prophylaxis: Eliquis Patient understands risks, benefits, alternatives to oral anticoagulation. She endorses episodes of microscopic hematuria for which she is undergoing urological workup at this time.  However if this continues to be an ongoing issue would recommend referral to EP for Watchman device evaluation.  No recent hemoglobins available for review.  Will check H&H, BMP. Eliquis refilled.  Heart failure with improved ejection fraction (HFimpEF) (HCC) Stage C, NYHA class II Echocardiogram March 2022: LVEF 20-25%. Echocardiogram May 2023: LVEF 63%. Discontinue Farxiga 10 mg p.o. daily secondary to urinary tract infection. Continue isosorbide dinitrate 40 mg p.o. 3 times daily. Continue Toprol-XL 50 mg p.o. daily.   Continue Entresto 97/103 mg p.o. twice daily. Continue torsemide 20 mg p.o. every other day  Coronary artery disease involving native coronary artery of native heart without angina pectoris Denies anginal chest pain. No use of sublingual nitroglycerin tablets. Will refill nitro tablets. EKG shows atrial sensed ventricular paced rhythm Reemphasized the importance of secondary prevention with focus on improving her modifiable cardiovascular risk factors such as glycemic control, lipid management, blood pressure control, weight loss.  Essential hypertension Office blood pressures are acceptable. Medications reconciled.  Pure hypercholesterolemia Continue Zetia 10 mg p.o. daily. Patient plans to have her lipids rechecked by PCP.   Will need to reevaluate the need for additional antilipid-lowering agents at the next visit  Bilateral carotid bruits Carotid duplex to evaluate for  underlying disease burden.  ICD Medtronic Claria MRI CRTD 06/21/2019 Recent device interrogation report reviewed as part of medical decision  making at today's office visit.  Orders Placed:  Orders Placed This Encounter  Procedures   Basic metabolic panel    Standing Status:   Future    Number of Occurrences:   1    Standing Expiration Date:   10/05/2024   Pro b natriuretic peptide (BNP)    Standing Status:   Future    Number of Occurrences:   1    Standing Expiration Date:   10/05/2024   Hemoglobin and hematocrit, blood    Standing Status:   Future    Number of Occurrences:   1    Standing Expiration Date:   10/05/2024   EKG 12-Lead    As part of medical decision making device interrogation reports, EKG, echocardiogram May 2024, labs from Care Everywhere, progress note from A-fib clinic September 01, 2023 were reviewed during today's encounter.  Final Medication List:    Meds ordered this encounter  Medications   apixaban (ELIQUIS) 5 MG TABS tablet    Sig: Take 1 tablet (5 mg total) by mouth 2 (two) times daily.    Dispense:  60 tablet    Refill:  3   nitroGLYCERIN (NITROSTAT) 0.4 MG SL tablet    Sig: Place 1 tablet (0.4 mg total) under the tongue every 5 (five) minutes as needed for chest pain.    Dispense:  30 tablet    Refill:  0    Medications Discontinued During This Encounter  Medication Reason   aspirin EC 81 MG tablet Discontinued by provider   dapagliflozin propanediol (FARXIGA) 10 MG TABS tablet Discontinued by provider   apixaban (ELIQUIS) 5 MG TABS tablet Reorder   nitroGLYCERIN (NITROSTAT) 0.4 MG SL tablet Reorder     Current Outpatient Medications:    alendronate (FOSAMAX) 70 MG tablet, Take 1 tablet (70 mg total) by mouth once a week., Disp: 4 tablet, Rfl: 1   busPIRone (BUSPAR) 5 MG tablet, Take 5 mg by mouth 2 (two) times daily. As needed, Disp: , Rfl:    Calcium Carb-Cholecalciferol (CALCIUM 600 + D PO), Take by mouth., Disp: , Rfl:    denosumab  (XGEVA) 120 MG/1.7ML SOLN injection, Inject 120 mg into the skin., Disp: , Rfl:    ezetimibe (ZETIA) 10 MG tablet, Take 10 mg by mouth daily., Disp: , Rfl:    FLUoxetine (PROZAC) 10 MG capsule, Take 10 mg by mouth daily., Disp: , Rfl:    gabapentin (NEURONTIN) 100 MG capsule, Take 100 mg by mouth at bedtime., Disp: , Rfl:    glucose blood test strip, 1 each 3 (three) times daily. , Disp: , Rfl:    insulin aspart (NOVOLOG FLEXPEN) 100 UNIT/ML FlexPen, Inject into the skin., Disp: , Rfl:    insulin degludec (TRESIBA) 100 UNIT/ML FlexTouch Pen, Inject into the skin., Disp: , Rfl:    isosorbide dinitrate (ISORDIL) 40 MG tablet, TAKE 1 TABLET BY MOUTH 3 TIMES DAILY., Disp: 270 tablet, Rfl: 1   levothyroxine (SYNTHROID, LEVOTHROID) 50 MCG tablet, Take 50 mcg by mouth daily before breakfast. , Disp: , Rfl:    Magnesium Oxide 500 MG TABS, TAKE 1 TABLET (500 MG TOTAL) BY MOUTH IN THE MORNING AND AT BEDTIME. (Patient taking differently: Take 500 mg by mouth in the morning, at noon, and at bedtime.), Disp: 180 tablet, Rfl: 1   methocarbamol (ROBAXIN) 500 MG tablet, Take 500 mg by mouth 4 (four) times daily., Disp: , Rfl:    metoprolol succinate (TOPROL-XL) 50 MG 24 hr tablet, TAKE 1 TABLET  BY MOUTH EVERY DAY WITH OR IMMEDIATELY FOLLOWING A MEAL, Disp: 90 tablet, Rfl: 0   ONETOUCH VERIO test strip, 1 each by Other route 3 (three) times daily., Disp: , Rfl: 5   sacubitril-valsartan (ENTRESTO) 97-103 MG, Take 1 tablet by mouth 2 (two) times daily., Disp: 180 tablet, Rfl: 3   torsemide (DEMADEX) 20 MG tablet, Take 20 mg by mouth every other day., Disp: , Rfl:    vitamin B-12 (CYANOCOBALAMIN) 1000 MCG tablet, Take 1,000 mcg by mouth daily. , Disp: , Rfl:    Vitamin D, Ergocalciferol, (DRISDOL) 50000 units CAPS capsule, Take 50,000 Units by mouth every 7 (seven) days. Take on Thursdays, Disp: , Rfl:    apixaban (ELIQUIS) 5 MG TABS tablet, Take 1 tablet (5 mg total) by mouth 2 (two) times daily., Disp: 60 tablet,  Rfl: 3   nitroGLYCERIN (NITROSTAT) 0.4 MG SL tablet, Place 1 tablet (0.4 mg total) under the tongue every 5 (five) minutes as needed for chest pain., Disp: 30 tablet, Rfl: 0   rosuvastatin (CRESTOR) 40 MG tablet, Take 1 tablet (40 mg total) by mouth at bedtime., Disp: 90 tablet, Rfl: 3  Consent:   N/A  Disposition:   65-month follow-up with APP with management of atrial fibrillation and heart failure with improved EF management 30-month follow-up with myself. Patient may be asked to follow-up sooner based on the results of the above-mentioned testing.  Her questions and concerns were addressed to her satisfaction. She voices understanding of the recommendations provided during this encounter.    Signed, Tessa Lerner, DO, [facility]  [facility]  [address] [place], [place] [address] 10/06/2023 12:58 PM

## 2023-10-06 NOTE — Patient Instructions (Addendum)
Medication Instructions:  Your physician has recommended you make the following change in your medication:   STOP Aspirin 81 mg STOP Farxiga 10 mg  Refills have been sent to your pharmacy for Nitroglycerin and Eliquis.   *If you need a refill on your cardiac medications before your next appointment, please call your pharmacy*  Lab Work: TODAY BMP, Pro-BNP, and Hemoglobin/ Hematocrit  If you have labs (blood work) drawn today and your tests are completely normal, you will receive your results only by: MyChart Message (if you have MyChart) OR A paper copy in the mail If you have any lab test that is abnormal or we need to change your treatment, we will call you to review the results.  Testing/Procedures: Your physician has requested that you have a carotid duplex. This test is an ultrasound of the carotid arteries in your neck. It looks at blood flow through these arteries that supply the brain with blood. Allow one hour for this exam. There are no restrictions or special instructions.  Follow-Up: At [facility], you and your health needs are our priority.  As part of our continuing mission to provide you with exceptional heart care, we have created designated Provider Care Teams.  These Care Teams include your primary Cardiologist (physician) and Advanced Practice Providers (APPs -  Physician Assistants and Nurse Practitioners) who all work together to provide you with the care you need, when you need it.  We recommend signing up for the patient portal called "MyChart".  Sign up information is provided on this After Visit Summary.  MyChart is used to connect with patients for Virtual Visits (Telemedicine).  Patients are able to view lab/test results, encounter notes, upcoming appointments, etc.  Non-urgent messages can be sent to your provider as well.   To learn more about what you can do with MyChart, go to [email or web address].    Your next appointment:   3 month(s)  The format  for your next appointment:   In Person  Provider:   Jari Favre, PA-C, Ronie Spies, PA-C, Robin Searing, NP, Jacolyn Reedy, PA-C, Eligha Bridegroom, NP, Tereso Newcomer, PA-C, or Perlie Gold, PA-C. Then, [employer], DO will plan to see you again in 6 month(s).{

## 2023-10-07 ENCOUNTER — Telehealth: Payer: Self-pay

## 2023-10-07 DIAGNOSIS — I5032 Chronic diastolic (congestive) heart failure: Secondary | ICD-10-CM

## 2023-10-07 LAB — BASIC METABOLIC PANEL
BUN/Creatinine Ratio: 14 (ref 12–28)
BUN: 15 mg/dL (ref 8–27)
CO2: 28 mmol/L (ref 20–29)
Calcium: 8.6 mg/dL — ABNORMAL LOW (ref 8.7–10.3)
Chloride: 101 mmol/L (ref 96–106)
Creatinine, Ser: 1.04 mg/dL — ABNORMAL HIGH (ref 0.57–1.00)
Glucose: 101 mg/dL — ABNORMAL HIGH (ref 70–99)
Potassium: 3.2 mmol/L — ABNORMAL LOW (ref 3.5–5.2)
Sodium: 146 mmol/L — ABNORMAL HIGH (ref 134–144)
eGFR: 54 mL/min/{1.73_m2} — ABNORMAL LOW (ref >59)

## 2023-10-07 LAB — HEMOGLOBIN AND HEMATOCRIT, BLOOD
Hematocrit: 36.6 % (ref 34.0–46.6)
Hemoglobin: 11.7 g/dL (ref 11.1–15.9)

## 2023-10-07 LAB — PRO B NATRIURETIC PEPTIDE: NT-Pro BNP: 12509 pg/mL — ABNORMAL HIGH (ref 0–738)

## 2023-10-07 MED ORDER — TORSEMIDE 20 MG PO TABS: 20.0000 mg | ORAL_TABLET | Freq: Every day | ORAL | 3 refills | Status: DC

## 2023-10-07 NOTE — Telephone Encounter (Signed)
Per Dr. Odis Hollingshead, Start taking torsemide every day. Check a BMP and NT proBNP in 1 week. And also have her check her weights regularly. However call us back if she gains more than 1 pound over a day or 3 pounds over a week. Patient aware of result and changes.

## 2023-10-13 LAB — LAB REPORT - SCANNED: EGFR: 47

## 2023-10-21 ENCOUNTER — Telehealth: Payer: Self-pay | Admitting: Cardiology

## 2023-10-21 NOTE — Telephone Encounter (Signed)
Patient states she had lab work with PCP, Georgette Shell, NP, and she was told that some of her results were abnormal and advised to follow up with cardiology. She states she doesn't know what the report showed, but would like to know if Dr. Odis Hollingshead can review and provide feedback.

## 2023-10-22 NOTE — Telephone Encounter (Signed)
Labs from PCP show pro-BNP=10,689 on 10/13/23.   Last labs here on 10/06/23 showed BNP=12,509. Per Odis Hollingshead on 10/07/23:  Tessa Lerner, DO 10/07/2023  1:16 PM EST Back to Top    Routine labs after her recent office visit. NT proBNP significantly high compared to her prior levels. Clinically did not appear to be euvolemic. Had discontinued Jardiance yesterday due to urinary tract infections. She currently takes torsemide every other day recommend taking it on a daily basis and repeat BMP in 1 week.   Sunit Tolia, DO, [facility]   Reached out to patient to confirm that she is taking Torsemide daily, not every other day, but no answer. Left detailed message per DPR explaining what the concerning lab was and to call us back and report on her dosage.

## 2023-10-23 ENCOUNTER — Other Ambulatory Visit: Payer: Self-pay

## 2023-10-23 DIAGNOSIS — I5032 Chronic diastolic (congestive) heart failure: Secondary | ICD-10-CM

## 2023-10-23 MED ORDER — SPIRONOLACTONE 25 MG PO TABS
12.5000 mg | ORAL_TABLET | Freq: Every day | ORAL | 3 refills | Status: DC
Start: 2023-10-23 — End: 2024-01-05

## 2023-10-23 NOTE — Telephone Encounter (Signed)
Orders for labs are in and pt will have done at PCP. Medication already sent as well. No further questions from patient.

## 2023-10-23 NOTE — Progress Notes (Signed)
Per Dr. Odis Hollingshead, orders placed for Aldactone 12.5 mg once daily and BNO/pro-BNP/Magnesium draw in 1 week. Dr. Odis Hollingshead spoke with pt on the phone and the pt is aware of these current changes.

## 2023-10-23 NOTE — Telephone Encounter (Signed)
Spoke to the patient over the phone. No symptoms of congestive heart failure with the exception of lower extremity swelling.  Advised her to start wearing her bilateral compression stockings. Continue torsemide daily.  Her potassium levels are slightly trending low and with the discontinuation of Jardiance that she would benefit from another diuretic.  Please send in Aldactone 12.5 mg p.o. daily.  Repeat labs BMP, NT proBNP, and magnesium in 1 week to reevaluate kidneys and potassium levels.  Please have the patient either come into our office for labs or if she goes to her PCP they can have them done and send [place] a copy for reference.  Patient stated that she has a PCP appointment next Thursday, 10/29/2023.  Dior Dominik Colfax, DO, [facility]

## 2023-10-28 ENCOUNTER — Ambulatory Visit (HOSPITAL_COMMUNITY)
Admission: RE | Admit: 2023-10-28 | Discharge: 2023-10-28 | Disposition: A | Discharge status: Home / Self Care | Payer: Medicare PPO | Source: Ambulatory Visit | Attending: Cardiology | Admitting: Cardiology

## 2023-10-28 DIAGNOSIS — R0989 Other specified symptoms and signs involving the circulatory and respiratory systems: Secondary | ICD-10-CM

## 2023-10-29 ENCOUNTER — Ambulatory Visit (INDEPENDENT_AMBULATORY_CARE_PROVIDER_SITE_OTHER): Payer: Medicare PPO

## 2023-10-29 DIAGNOSIS — I255 Ischemic cardiomyopathy: Secondary | ICD-10-CM | POA: Diagnosis not present

## 2023-10-29 LAB — CUP PACEART REMOTE DEVICE CHECK
Battery Remaining Longevity: 62 mo
Battery Voltage: 2.98 V
Brady Statistic AP VP Percent: 20.63 %
Brady Statistic AP VS Percent: 0.49 %
Brady Statistic AS VP Percent: 77.45 %
Brady Statistic AS VS Percent: 1.44 %
Brady Statistic RA Percent Paced: 21.06 %
Brady Statistic RV Percent Paced: 3.68 %
Date Time Interrogation Session: 20241212073724
HighPow Impedance: 52 Ohm
Implantable Lead Connection Status: 753985
Implantable Lead Connection Status: 753985
Implantable Lead Connection Status: 753985
Implantable Lead Implant Date: 20200804
Implantable Lead Implant Date: 20200804
Implantable Lead Implant Date: 20200804
Implantable Lead Location: 753858
Implantable Lead Location: 753859
Implantable Lead Location: 753860
Implantable Lead Model: 4298
Implantable Lead Model: 5076
Implantable Lead Model: 6935
Implantable Pulse Generator Implant Date: 20200804
Lead Channel Impedance Value: 132.321
Lead Channel Impedance Value: 132.321
Lead Channel Impedance Value: 132.321
Lead Channel Impedance Value: 142.5 Ohm
Lead Channel Impedance Value: 142.5 Ohm
Lead Channel Impedance Value: 247 Ohm
Lead Channel Impedance Value: 285 Ohm
Lead Channel Impedance Value: 285 Ohm
Lead Channel Impedance Value: 285 Ohm
Lead Channel Impedance Value: 342 Ohm
Lead Channel Impedance Value: 361 Ohm
Lead Channel Impedance Value: 399 Ohm
Lead Channel Impedance Value: 418 Ohm
Lead Channel Impedance Value: 456 Ohm
Lead Channel Impedance Value: 475 Ohm
Lead Channel Impedance Value: 475 Ohm
Lead Channel Impedance Value: 475 Ohm
Lead Channel Impedance Value: 475 Ohm
Lead Channel Pacing Threshold Amplitude: 0.625 V
Lead Channel Pacing Threshold Amplitude: 0.625 V
Lead Channel Pacing Threshold Amplitude: 1 V
Lead Channel Pacing Threshold Pulse Width: 0.4 ms
Lead Channel Pacing Threshold Pulse Width: 0.4 ms
Lead Channel Pacing Threshold Pulse Width: 0.4 ms
Lead Channel Sensing Intrinsic Amplitude: 1.75 mV
Lead Channel Sensing Intrinsic Amplitude: 1.75 mV
Lead Channel Sensing Intrinsic Amplitude: 31.625 mV
Lead Channel Sensing Intrinsic Amplitude: 31.625 mV
Lead Channel Setting Pacing Amplitude: 1.5 V
Lead Channel Setting Pacing Amplitude: 1.5 V
Lead Channel Setting Pacing Amplitude: 2.5 V
Lead Channel Setting Pacing Pulse Width: 0.4 ms
Lead Channel Setting Pacing Pulse Width: 0.4 ms
Lead Channel Setting Sensing Sensitivity: 0.3 mV
Zone Setting Status: 755011
Zone Setting Status: 755011

## 2023-10-30 ENCOUNTER — Other Ambulatory Visit: Payer: Self-pay

## 2023-10-30 DIAGNOSIS — E876 Hypokalemia: Secondary | ICD-10-CM

## 2023-10-30 MED ORDER — POTASSIUM CHLORIDE CRYS ER 20 MEQ PO TBCR: 40.0000 meq | EXTENDED_RELEASE_TABLET | Freq: Every day | ORAL | 3 refills | Status: AC

## 2023-10-30 NOTE — Progress Notes (Signed)
Per Dr. Emelda Brothers recommendations after reviewing external labs, rx for potassium 40 mEq has been sent to pharmacy and BMP to be completed in 1 week has been ordered. Pt is aware of these changes.

## 2023-11-09 ENCOUNTER — Other Ambulatory Visit: Payer: Self-pay | Admitting: *Deleted

## 2023-11-09 DIAGNOSIS — E876 Hypokalemia: Secondary | ICD-10-CM

## 2023-11-09 LAB — BASIC METABOLIC PANEL
BUN/Creatinine Ratio: 18 (ref 12–28)
BUN: 22 mg/dL (ref 8–27)
CO2: 26 mmol/L (ref 20–29)
Calcium: 9.1 mg/dL (ref 8.7–10.3)
Chloride: 104 mmol/L (ref 96–106)
Creatinine, Ser: 1.2 mg/dL — ABNORMAL HIGH (ref 0.57–1.00)
Glucose: 91 mg/dL (ref 70–99)
Potassium: 4.6 mmol/L (ref 3.5–5.2)
Sodium: 144 mmol/L (ref 134–144)
eGFR: 45 mL/min/{1.73_m2} — ABNORMAL LOW (ref >59)

## 2023-12-04 NOTE — Progress Notes (Signed)
Remote ICD transmission.   

## 2023-12-08 ENCOUNTER — Telehealth: Payer: Self-pay | Admitting: Cardiology

## 2023-12-08 DIAGNOSIS — I255 Ischemic cardiomyopathy: Secondary | ICD-10-CM

## 2023-12-08 DIAGNOSIS — I5042 Chronic combined systolic (congestive) and diastolic (congestive) heart failure: Secondary | ICD-10-CM

## 2023-12-08 MED ORDER — ISOSORBIDE DINITRATE 40 MG PO TABS: 40.0000 mg | ORAL_TABLET | Freq: Three times a day (TID) | ORAL | 3 refills | Status: DC

## 2023-12-08 MED ORDER — METOPROLOL SUCCINATE ER 50 MG PO TB24: 50.0000 mg | ORAL_TABLET | Freq: Every day | ORAL | 3 refills | Status: AC

## 2023-12-08 NOTE — Telephone Encounter (Signed)
*  STAT* If patient is at the pharmacy, call can be transferred to refill team.   1. Which medications need to be refilled? (please list name of each medication and dose if known) isosorbide dinitrate (ISORDIL) 40 MG tablet    metoprolol succinate (TOPROL-XL) 50 MG 24 hr tablet    2. Which pharmacy/location (including street and city if local pharmacy) is medication to be sent to? [id number] - [place], [place] - [address]   3. Do they need a 30 day or 90 day supply? 90

## 2023-12-22 ENCOUNTER — Encounter: Payer: Self-pay | Admitting: Internal Medicine

## 2023-12-22 ENCOUNTER — Ambulatory Visit: Payer: Medicare PPO | Attending: Internal Medicine | Admitting: Internal Medicine

## 2023-12-22 VITALS — BP 126/74 | HR 52 | Ht 62.0 in | Wt 193.4 lb

## 2023-12-22 DIAGNOSIS — I447 Left bundle-branch block, unspecified: Secondary | ICD-10-CM | POA: Diagnosis not present

## 2023-12-22 DIAGNOSIS — I5022 Chronic systolic (congestive) heart failure: Secondary | ICD-10-CM

## 2023-12-22 LAB — CUP PACEART INCLINIC DEVICE CHECK
Battery Remaining Longevity: 58 mo
Battery Voltage: 2.98 V
Brady Statistic AP VP Percent: 15.09 %
Brady Statistic AP VS Percent: 0.39 %
Brady Statistic AS VP Percent: 82.95 %
Brady Statistic AS VS Percent: 1.57 %
Brady Statistic RA Percent Paced: 15.46 %
Brady Statistic RV Percent Paced: 2.5 %
Date Time Interrogation Session: 20250204130054
HighPow Impedance: 48 Ohm
Implantable Lead Connection Status: 753985
Implantable Lead Connection Status: 753985
Implantable Lead Connection Status: 753985
Implantable Lead Implant Date: 20200804
Implantable Lead Implant Date: 20200804
Implantable Lead Implant Date: 20200804
Implantable Lead Location: 753858
Implantable Lead Location: 753859
Implantable Lead Location: 753860
Implantable Lead Model: 4298
Implantable Lead Model: 5076
Implantable Lead Model: 6935
Implantable Pulse Generator Implant Date: 20200804
Lead Channel Impedance Value: 142.5 Ohm
Lead Channel Impedance Value: 147.097
Lead Channel Impedance Value: 147.097
Lead Channel Impedance Value: 147.097
Lead Channel Impedance Value: 147.097
Lead Channel Impedance Value: 285 Ohm
Lead Channel Impedance Value: 285 Ohm
Lead Channel Impedance Value: 304 Ohm
Lead Channel Impedance Value: 304 Ohm
Lead Channel Impedance Value: 361 Ohm
Lead Channel Impedance Value: 361 Ohm
Lead Channel Impedance Value: 399 Ohm
Lead Channel Impedance Value: 456 Ohm
Lead Channel Impedance Value: 475 Ohm
Lead Channel Impedance Value: 475 Ohm
Lead Channel Impedance Value: 513 Ohm
Lead Channel Impedance Value: 513 Ohm
Lead Channel Impedance Value: 532 Ohm
Lead Channel Pacing Threshold Amplitude: 0.625 V
Lead Channel Pacing Threshold Amplitude: 0.625 V
Lead Channel Pacing Threshold Amplitude: 0.875 V
Lead Channel Pacing Threshold Pulse Width: 0.4 ms
Lead Channel Pacing Threshold Pulse Width: 0.4 ms
Lead Channel Pacing Threshold Pulse Width: 0.4 ms
Lead Channel Sensing Intrinsic Amplitude: 1.625 mV
Lead Channel Sensing Intrinsic Amplitude: 1.75 mV
Lead Channel Sensing Intrinsic Amplitude: 26.625 mV
Lead Channel Sensing Intrinsic Amplitude: 31.625 mV
Lead Channel Setting Pacing Amplitude: 1.5 V
Lead Channel Setting Pacing Amplitude: 2 V
Lead Channel Setting Pacing Amplitude: 2.5 V
Lead Channel Setting Pacing Pulse Width: 0.4 ms
Lead Channel Setting Pacing Pulse Width: 0.4 ms
Lead Channel Setting Sensing Sensitivity: 0.3 mV
Zone Setting Status: 755011
Zone Setting Status: 755011

## 2023-12-22 NOTE — Patient Instructions (Addendum)
 Medication Instructions:  Your physician recommends that you continue on your current medications as directed. Please refer to the Current Medication list given to you today.  *If you need a refill on your cardiac medications before your next appointment, please call your pharmacy*  Lab Work: None ordered.  If you have labs (blood work) drawn today and your tests are completely normal, you will receive your results only by: MyChart Message (if you have MyChart) OR A paper copy in the mail If you have any lab test that is abnormal or we need to change your treatment, we will call you to review the results.  Testing/Procedures: None ordered.  Follow-Up: At [facility], you and your health needs are our priority.  As part of our continuing mission to provide you with exceptional heart care, we have created designated Provider Care Teams.  These Care Teams include your primary Cardiologist (physician) and Advanced Practice Providers (APPs -  Physician Assistants and Nurse Practitioners) who all work together to provide you with the care you need, when you need it.   Your next appointment:   1 year(s)  The format for your next appointment:   In Person  Provider:   Dr. Virl Son one of the following Advanced Practice Providers on your designated Care Team:   Francis Dowse, [place] Casimiro Needle "Mardelle Matte" [place], [place] Earnest Rosier, NP  Remote monitoring is used to monitor your Pacemaker/ ICD from home. This monitoring reduces the number of office visits required to check your device to one time per year. It allows [place] to keep an eye on the functioning of your device to ensure it is working properly.   Important Information About Sugar

## 2023-12-22 NOTE — Progress Notes (Signed)
 HPI Sara Graves returns today for followup of her ICD. She is a pleasant 83 yo woman with an ICM, chronic systolic heart failure, LBBB, s/p BIV ICD insertion approx 5 years ago. She is saddened by the loss of both of her sons who died less than 10 months apart. She denies medical or dietary non-compliance.  Allergies  Allergen Reactions   Rosiglitazone Maleate Anaphylaxis and Swelling   Morphine Other (See Comments)    SEVERE HYPOTENSION    Tramadol Other (See Comments)    Unknown reaction   Cephalexin  Diarrhea   Elavil  [Amitriptyline ] Nausea Only   Sulfa  Antibiotics Diarrhea and Nausea And Vomiting   Tramadol-Acetaminophen  Rash     Current Outpatient Medications  Medication Sig Dispense Refill   alendronate  (FOSAMAX ) 70 MG tablet Take 1 tablet (70 mg total) by mouth once a week. 4 tablet 1   apixaban  (ELIQUIS ) 5 MG TABS tablet Take 1 tablet (5 mg total) by mouth 2 (two) times daily. 60 tablet 3   BD PEN NEEDLE NANO 2ND GEN 32G X 4 MM MISC      busPIRone  (BUSPAR ) 5 MG tablet Take 5 mg by mouth 2 (two) times daily. As needed     Calcium  Carb-Cholecalciferol (CALCIUM  600 + D PO) Take by mouth.     Continuous Glucose Sensor (FREESTYLE LIBRE 3 SENSOR) MISC Place onto the skin.     denosumab (XGEVA) 120 MG/1.7ML SOLN injection Inject 120 mg into the skin.     ezetimibe  (ZETIA ) 10 MG tablet Take 10 mg by mouth daily.     FLUoxetine  (PROZAC ) 10 MG capsule Take 10 mg by mouth daily.     gabapentin  (NEURONTIN ) 100 MG capsule Take 100 mg by mouth at bedtime.     glucose blood test strip 1 each 3 (three) times daily.      insulin  aspart (NOVOLOG  FLEXPEN) 100 UNIT/ML FlexPen Inject into the skin.     insulin  degludec (TRESIBA) 100 UNIT/ML FlexTouch Pen Inject into the skin.     insulin  glargine (LANTUS  SOLOSTAR) 100 UNIT/ML Solostar Pen Inject into the skin.     isosorbide  dinitrate (ISORDIL ) 40 MG tablet Take 1 tablet (40 mg total) by mouth 3 (three) times daily. 270 tablet 3    levothyroxine  (SYNTHROID , LEVOTHROID) 50 MCG tablet Take 50 mcg by mouth daily before breakfast.      Magnesium  Oxide 500 MG TABS TAKE 1 TABLET (500 MG TOTAL) BY MOUTH IN THE MORNING AND AT BEDTIME. (Patient taking differently: Take 500 mg by mouth in the morning, at noon, and at bedtime.) 180 tablet 1   methocarbamol  (ROBAXIN ) 500 MG tablet Take 500 mg by mouth 4 (four) times daily.     metoprolol  succinate (TOPROL -XL) 50 MG 24 hr tablet Take 1 tablet (50 mg total) by mouth daily. Take with or immediately following a meal. 90 tablet 3   nitroGLYCERIN  (NITROSTAT ) 0.4 MG SL tablet Place 1 tablet (0.4 mg total) under the tongue every 5 (five) minutes as needed for chest pain. 30 tablet 0   ofloxacin (OCUFLOX) 0.3 % ophthalmic solution      ONETOUCH VERIO test strip 1 each by Other route 3 (three) times daily.  5   potassium chloride  SA (KLOR-CON  M20) 20 MEQ tablet Take 2 tablets (40 mEq total) by mouth daily. 90 tablet 3   prednisoLONE acetate (PRED FORTE) 1 % ophthalmic suspension Apply to eye.     REPATHA  SURECLICK 140 MG/ML SOAJ Inject into the skin.  spironolactone  (ALDACTONE ) 25 MG tablet Take 0.5 tablets (12.5 mg total) by mouth daily. 90 tablet 3   spironolactone  (ALDACTONE ) 25 MG tablet Take by mouth.     torsemide  (DEMADEX ) 20 MG tablet Take 1 tablet (20 mg total) by mouth daily. 90 tablet 3   vitamin B-12 (CYANOCOBALAMIN ) 1000 MCG tablet Take 1,000 mcg by mouth daily.      Vitamin D , Ergocalciferol , (DRISDOL ) 50000 units CAPS capsule Take 50,000 Units by mouth every 7 (seven) days. Take on Thursdays     rosuvastatin  (CRESTOR ) 40 MG tablet Take 1 tablet (40 mg total) by mouth at bedtime. 90 tablet 3   No current facility-administered medications for this visit.     Past Medical History:  Diagnosis Date   Acute combined systolic and diastolic heart failure (HCC) 04/25/2018   Anxiety    Arthritis    hands (12/29/2017)   Basal cell carcinoma of nose    removed   Bursitis of left  shoulder    BV ICD Medtronic [place] MRI CRTD 06/21/2019   Cat scratch fever    Late 90s   Cataract, nuclear sclerotic, both eyes 05/13/2021   Chronic combined systolic and diastolic CHF (congestive heart failure) (HCC) 06/08/2019   Coronary artery disease    Depression    Diabetes mellitus, type II, insulin  dependent (HCC)    Encounter for assessment of implantable cardioverter-defibrillator (ICD) 09/28/2019   GERD (gastroesophageal reflux disease)    H. pylori infection 2008 and 1998   treated   Hyperlipidemia    Hypertension    Hypothyroidism    Low oxygen saturation    Macular degeneration    Multinodular goiter    Rotator cuff tear, left recurrent    Urge urinary incontinence    UTI (lower urinary tract infection) 05/2016    ROS:   All systems reviewed and negative except as noted in the HPI.   Past Surgical History:  Procedure Laterality Date   APPENDECTOMY  AGE 50   BACK SURGERY     BASAL CELL CARCINOMA EXCISION     nose   BIV ICD INSERTION CRT-D N/A 06/21/2019   Procedure: BIV ICD INSERTION CRT-D;  Surgeon: Waddell Danelle ORN, MD;  Location: [facility] INVASIVE CV LAB;  Service: Cardiovascular;  Laterality: N/A;   BLADDER NECK SUSPENSION  1970's   BUNIONECTOMY WITH HAMMERTOE RECONSTRUCTION Bilateral    CARPAL TUNNEL RELEASE Right 05/2017   COLONOSCOPY W/ POLYPECTOMY     CORONARY ANGIOPLASTY WITH STENT PLACEMENT  12/29/2017   CORONARY BALLOON ANGIOPLASTY N/A 02/08/2019   Procedure: CORONARY BALLOON ANGIOPLASTY;  Surgeon: Ladona Heinz, MD;  Location: [facility] INVASIVE CV LAB;  Service: Cardiovascular;  Laterality: N/A;   CORONARY STENT INTERVENTION N/A 12/29/2017   Procedure: CORONARY STENT INTERVENTION;  Surgeon: Elmira Newman PARAS, MD;  Location: [facility] INVASIVE CV LAB;  Service: Cardiovascular;  Laterality: N/A;   CORONARY STENT INTERVENTION N/A 04/26/2018   Procedure: CORONARY STENT INTERVENTION;  Surgeon: Elmira Newman PARAS, MD;  Location: [facility] INVASIVE CV LAB;  Service:  Cardiovascular;  Laterality: N/A;   CORONARY STENT INTERVENTION N/A 02/08/2019   Procedure: CORONARY STENT INTERVENTION;  Surgeon: Ladona Heinz, MD;  Location: [facility] INVASIVE CV LAB;  Service: Cardiovascular;  Laterality: N/A;   ESOPHAGOGASTRODUODENOSCOPY ENDOSCOPY     FORAMINAL DECOMPRESSION AT L2 TO THE SACRUM  01-05-2008   L2  -  S1   GANGLION CYST EXCISION Left 01/17/2009   ring finger   IMPLANTATION PERMANENT SPINAL CORD STIMULATOR  06-15-2008   JUNE 2013--  BATTERY CHANGE   JOINT REPLACEMENT     LEFT HEART CATH AND CORONARY ANGIOGRAPHY N/A 04/26/2018   Procedure: LEFT HEART CATH AND CORONARY ANGIOGRAPHY;  Surgeon: Elmira Newman PARAS, MD;  Location: [facility] INVASIVE CV LAB;  Service: Cardiovascular;  Laterality: N/A;   LEFT HEART CATH AND CORONARY ANGIOGRAPHY N/A 02/08/2019   Procedure: LEFT HEART CATH AND CORONARY ANGIOGRAPHY;  Surgeon: Ladona Heinz, MD;  Location: [facility] INVASIVE CV LAB;  Service: Cardiovascular;  Laterality: N/A;   LIPOMA EXCISION Right    RIGHT ELBOW   METATARSAL HEAD EXCISION Right 06/16/2018   Procedure: right 5th metatarsal excision, a mini c-arm;  Surgeon: Gershon Donnice SAUNDERS, DPM;  Location: [facility];  Service: Podiatry;  Laterality: Right;  anesthesia can do block   RIGHT/LEFT HEART CATH AND CORONARY ANGIOGRAPHY N/A 12/29/2017   Procedure: RIGHT/LEFT HEART CATH AND CORONARY ANGIOGRAPHY;  Surgeon: Elmira Newman PARAS, MD;  Location: [facility] INVASIVE CV LAB;  Service: Cardiovascular;  Laterality: N/A;   SHOULDER OPEN ROTATOR CUFF REPAIR  09/07/2012   Procedure: ROTATOR CUFF REPAIR SHOULDER OPEN;  Surgeon: Lynwood SHAUNNA Bern, MD;  Location: [facility];  Service: Orthopedics;  Laterality: Left;  OPEN ANTERIOR ACROMIONECTOMY AND ROTATOR CUFF REPAIR ON LEFT    SHOULDER OPEN ROTATOR CUFF REPAIR Left 12/28/2012   Procedure: ROTATOR CUFF REPAIR SHOULDER OPEN;  Surgeon: Lynwood SHAUNNA Bern, MD;  Location: [facility];  Service: Orthopedics;  Laterality: Left;  OPEN  SHOULDER ROTATOR CUFF REPAIR ON LEFT  WITH ANTERIOR ACROMINECTOMY    SHOULDER OPEN ROTATOR CUFF REPAIR Right 03/28/2003   RIGHT SHOULDER  DEGENERATIVE AC JOINT AND RC TEAR   SPINAL CORD STIMULATOR BATTERY EXCHANGE N/A 07/03/2016   Procedure: SPINAL CORD STIMULATOR BATTERY PLACMENT;  Surgeon: Donaciano Sprang, MD;  Location: [facility];  Service: Orthopedics;  Laterality: N/A;   TONSILLECTOMY  AGE 21   TOTAL KNEE ARTHROPLASTY Right 05/06/2000   OA RIGHT KNEE   VAGINAL HYSTERECTOMY  1970's     Family History  Problem Relation Age of Onset   Cancer Mother        breast   Heart Problems Mother    Prostate cancer Father    Muscular dystrophy Son    Lung cancer Son    Cancer Maternal Grandmother        breast   Heart disease Maternal Grandfather    Breast cancer Sister      Social History   Socioeconomic History   Marital status: Married    Spouse name: Jacoya Bauman   Number of children: 2   Years of education: 11th   Highest education level: Not on file  Occupational History   Occupation: Retired  Tobacco Use   Smoking status: Never   Smokeless tobacco: Never   Tobacco comments:    Never smoked 09/01/23  Vaping Use   Vaping status: Never Used  Substance and Sexual Activity   Alcohol use: No   Drug use: No   Sexual activity: Not Currently  Other Topics Concern   Not on file  Social History Narrative   Pt lives with her husband and 1 grown son in [place]. Younger son has muscular dystrophy. Having a difficult time as primary caregiver to son who is not doing well.  Declined respite care/placement.   Caffeine Use: none; very little   Social Drivers of Corporate Investment Banker Strain: Low Risk  (12/08/2023)   Received from [facility]   Overall Financial Resource Strain (CARDIA)  Difficulty of Paying Living Expenses: Not hard at all  Food Insecurity: No Food Insecurity (12/08/2023)   Received from [facility]   Hunger Vital Sign    Worried About Running Out of  Food in the Last Year: Never true    Ran Out of Food in the Last Year: Never true  Transportation Needs: No Transportation Needs (12/08/2023)   Received from [facility] - Transportation    Lack of Transportation (Medical): No    Lack of Transportation (Non-Medical): No  Physical Activity: Unknown (07/15/2023)   Received from [facility]   Exercise Vital Sign    Days of Exercise per Week: 0 days    Minutes of Exercise per Session: Not on file  Stress: No Stress Concern Present (07/15/2023)   Received from [facility] - Occupational Stress Questionnaire    Feeling of Stress : Not at all  Social Connections: Socially Integrated (07/15/2023)   Received from [facility]   Social Network    How would you rate your social network (family, work, friends)?: Good participation with social networks  Intimate Partner Violence: Not At Risk (07/15/2023)   Received from [facility]   HITS    Over the last 12 months how often did your partner physically hurt you?: Never    Over the last 12 months how often did your partner insult you or talk down to you?: Never    Over the last 12 months how often did your partner threaten you with physical harm?: Never    Over the last 12 months how often did your partner scream or curse at you?: Never     BP 126/74   Pulse (!) 52   Ht 5' 2 (1.575 m)   Wt 193 lb 6.4 oz (87.7 kg)   SpO2 94%   BMI 35.37 kg/m   Physical Exam:  Well appearing elderly woman, NAD HEENT: Unremarkable Neck:  No JVD, no thyromegally Lymphatics:  No adenopathy Back:  No CVA tenderness Lungs:  Clear with no wheezes HEART:  Regular rate rhythm, no murmurs, no rubs, no clicks Abd:  soft, positive bowel sounds, no organomegally, no rebound, no guarding Ext:  2 plus pulses, no edema, no cyanosis, no clubbing Skin:  No rashes no nodules Neuro:  CN II through XII intact, motor grossly intact  DEVICE  Normal device  function.  See PaceArt for details.   Assess/Plan: Chronic systolic heart failure -her symptoms are class 2. Her EF has normalized with biv pacing. Continue GDMT. HTN - her bp is controlled. No change in meds. PAF - she will continue systemic anti-coag.  Danelle Norris Brumbach,MD

## 2024-01-04 NOTE — Progress Notes (Unsigned)
Cardiology Office Note:  .   Date:  01/05/2024  ID:  Sara Graves, DOB 1941-04-10, MRN [id number] PCP: Marella Bile  [facility] Providers Cardiologist:  Alizay Bronkema Lerner, DO {  History of Present Illness: .   Sara Graves is a 83 y.o. female with a past medical history of paroxysmal atrial fibrillation (detected September 2024) he, hypertension, insulin-dependent diabetes mellitus type 2, CAD status post prior LAD and ramus PCI, HFimpEF, recovered ischemic cardiomyopathy, status post BiV ICD implantation 06/2019 and history of right leg cellulitis here for follow-up appointment.  Patient was seen October 2022 and since then had been followed by APP's within the practice.  Presented for follow-up to see Dr. Odis Hollingshead in November.  Recently had been diagnosed with paroxysmal atrial fibrillation via her BiV ICD.  In late September/early October 2024 noted to have PAF on device and was referred to A-fib clinic and started on Eliquis for thromboembolic prophylaxis.  Patient stated she was doing well on oral anticoagulation.  Was told that she had some blood in her urine (microscopic).  CT scan with urology in [place] and plan to continue her follow-up with them.  She also has a history of a lot of urinary tract infections.  Clinically denied angina, heart failure symptoms.  Ambulates with a cane.  Today, she The patient, with a history of heart failure and atrial fibrillation, has been taking a full pill of spironolactone due to difficulty in cutting the pill. The patient reports feeling okay on the full pill. The patient has also been diagnosed with atrial fibrillation and has been started on Eliquis. The patient reports no issues with the medication and denies any bleeding. The patient has stopped taking aspirin and Farxiga, the latter due to recurrent urinary tract infections. The patient also reports having a pacemaker and receiving eye injections due to retinal issues. The patient  has been managing these conditions with regular check-ups and medication adjustments as needed.  Reports no shortness of breath nor dyspnea on exertion. Reports no chest pain, pressure, or tightness. No edema, orthopnea, PND. Reports no palpitations.   Discussed the use of AI scribe software for clinical note transcription with the patient, who gave verbal consent to proceed.  ROS: Pertinent ROS in HPI  Studies Reviewed: .        Echocardiogram: 02/12/2021: LVEF 20-25%, global hypokinesis, LV size dilated, right ventricular systolic function moderately reduced in size dilated, estimated RAP 68 mmHg, left severe LAE, moderate RAE, moderate to severe MR/TR see report for additional details   May 2023: LVEF 63%, mild AR, moderate MR, RVSP 34 mmHg.   Heart catheterization: Coronary angiogram 02/08/2019:  Distal left main 20% diffuse disease.  Ostial LAD stent shows diffuse 20% in-stent restenosis (3.0 x 15 mm resolute Onyx on 12/29/2017).  Mid LAD and mid to distal LAD there are tandem lesions 80% and 95%.  Ramus intermediate ostial 95 to 99% stenosis.  Previously placed RI stent widely patent ( 2.0 x 12 mm resolute on 04/26/2018).  30 to 40% tandem lesions in the RCA. Cutting Balloon angioplasty of the ramus intermediate ostial high-grade stenosis, 99% reduced to 0% and Cutting Balloon angioplasty of the mid LAD followed by stenting with 2.5 x 32 mm Synergy DES for high-grade 90% stenosis reduced to 0% and TIMI-3 to TIMI-3 flow maintained in both lesions.   Carotid artery duplex 12/02/2018: No hemodynamically significant arterial disease in the internal carotid artery bilaterally. Minimal plaque noted bilateral carotid arteries. Antegrade  right vertebral artery flow. Antegrade left vertebral artery flow.   Remote CRT-D transmission 11/10/2021: AP 57%, CRT 97%. Longevity 7 years and 6 months. Lead impedance and thresholds within normal limits. There were no high ventricular rate episodes, no mode  switches. Thoracic impedance is at baseline and does not suggest volume overload state. Normal function. Risk Assessment/Calculations:    CHA2DS2-VASc Score = 7   This indicates a 11.2% annual risk of stroke. The patient's score is based upon: CHF History: 1 HTN History: 1 Diabetes History: 1 Stroke History: 0 Vascular Disease History: 1 Age Score: 2 Gender Score: 1      Physical Exam:   VS:  BP 130/60   Pulse (!) 58   Ht 5\' 2"  (1.575 m)   Wt 193 lb 12.8 oz (87.9 kg)   SpO2 96%   BMI 35.45 kg/m    Wt Readings from Last 3 Encounters:  01/05/24 193 lb 12.8 oz (87.9 kg)  12/22/23 193 lb 6.4 oz (87.7 kg)  10/06/23 198 lb (89.8 kg)    GEN: Well nourished, well developed in no acute distress NECK: No JVD; No carotid bruits CARDIAC: RRR, no murmurs, rubs, gallops RESPIRATORY:  Clear to auscultation without rales, wheezing or rhonchi  ABDOMEN: Soft, non-tender, non-distended EXTREMITIES:  trace bilateral edema; No deformity   ASSESSMENT AND PLAN: .   Heart Failure with improved EF Patient has been self-adjusting Spironolactone dose due to difficulty splitting pills. Currently tolerating full pill (25mg ) daily. Discussed potential for hyperkalemia with increased dose. -Check electrolytes today to assess potassium level. -Update prescription to reflect full pill daily.  Atrial Fibrillation Patient is on Eliquis for stroke prevention. No reported bleeding or adverse effects. NSR today. -Continue Eliquis as prescribed.  Urinary Tract Infections Patient reports history of recurrent UTIs, potentially related to [place] use. Marcelline Deist has been discontinued. -Monitor for recurrence of UTIs.  Retinal Detachment Patient had recent eye surgery for retinal detachment and is scheduled for Eylea injection. -Continue follow-up with ophthalmologist as scheduled.  Mitral Valve Regurgitation Moderate leak noted on previous echocardiogram. Patient reports occasional leg swelling. -Monitor  for worsening symptoms, consider repeat echocardiogram if symptoms worsen.  General Health Maintenance -Continue pacemaker checks as scheduled. -Continue annual carotid ultrasound monitoring.  Coronary artery disease without angina -stable, continue current medications  Hypertension -well controlled, continue current medications  Bilateral carotid bruits -routine imaging, last US showed mild bilateral disease  Moderate MR -last echo reviewed without worsening symptoms -will check echo every few years  ICD Medtronic placed 06/21/2019 -saw Dr. Ladona Ridgel 12/22/23     Dispo: 3 month follow-up with Dr. Odis Hollingshead  Signed, Sharlene Dory, PA-C

## 2024-01-05 ENCOUNTER — Encounter: Payer: Self-pay | Admitting: Physician Assistant

## 2024-01-05 ENCOUNTER — Ambulatory Visit: Payer: Medicare PPO | Attending: Physician Assistant | Admitting: Physician Assistant

## 2024-01-05 VITALS — BP 130/60 | HR 58 | Ht 62.0 in | Wt 193.8 lb

## 2024-01-05 DIAGNOSIS — I4891 Unspecified atrial fibrillation: Secondary | ICD-10-CM | POA: Diagnosis not present

## 2024-01-05 DIAGNOSIS — I2510 Atherosclerotic heart disease of native coronary artery without angina pectoris: Secondary | ICD-10-CM | POA: Diagnosis not present

## 2024-01-05 DIAGNOSIS — I5042 Chronic combined systolic (congestive) and diastolic (congestive) heart failure: Secondary | ICD-10-CM | POA: Diagnosis not present

## 2024-01-05 DIAGNOSIS — I5022 Chronic systolic (congestive) heart failure: Secondary | ICD-10-CM

## 2024-01-05 DIAGNOSIS — Z79899 Other long term (current) drug therapy: Secondary | ICD-10-CM

## 2024-01-05 DIAGNOSIS — I5032 Chronic diastolic (congestive) heart failure: Secondary | ICD-10-CM

## 2024-01-05 DIAGNOSIS — R0989 Other specified symptoms and signs involving the circulatory and respiratory systems: Secondary | ICD-10-CM

## 2024-01-05 DIAGNOSIS — E7800 Pure hypercholesterolemia, unspecified: Secondary | ICD-10-CM

## 2024-01-05 DIAGNOSIS — I10 Essential (primary) hypertension: Secondary | ICD-10-CM | POA: Diagnosis not present

## 2024-01-05 MED ORDER — TORSEMIDE 20 MG PO TABS: 20.0000 mg | ORAL_TABLET | ORAL | 3 refills | Status: AC | PRN

## 2024-01-05 MED ORDER — SPIRONOLACTONE 25 MG PO TABS: 25.0000 mg | ORAL_TABLET | Freq: Every day | ORAL | 3 refills | Status: AC

## 2024-01-05 NOTE — Patient Instructions (Addendum)
Medication Instructions:  Take Torsemide 20 mg once daily as needed Increase Spironolactone to 25 mg once daily *If you need a refill on your cardiac medications before your next appointment, please call your pharmacy*   Lab Work: BMET If you have labs (blood work) drawn today and your tests are completely normal, you will receive your results only by: MyChart Message (if you have MyChart) OR A paper copy in the mail If you have any lab test that is abnormal or we need to change your treatment, we will call you to review the results.   Follow-Up: At [facility], you and your health needs are our priority.  As part of our continuing mission to provide you with exceptional heart care, we have created designated Provider Care Teams.  These Care Teams include your primary Cardiologist (physician) and Advanced Practice Providers (APPs -  Physician Assistants and Nurse Practitioners) who all work together to provide you with the care you need, when you need it.  We recommend signing up for the patient portal called "MyChart".  Sign up information is provided on this After Visit Summary.  MyChart is used to connect with patients for Virtual Visits (Telemedicine).  Patients are able to view lab/test results, encounter notes, upcoming appointments, etc.  Non-urgent messages can be sent to your provider as well.   To learn more about what you can do with MyChart, go to [email or web address].    Your next appointment:   3 month(s)  Provider:   Odis Hollingshead, DO  Other Instructions  Continue low sodium diet Continue heart healthy diet Check weight regularly    1st Floor: - Lobby - Registration  - Pharmacy  - Lab - Cafe  2nd Floor: - PV Lab - Diagnostic Testing (echo, CT, nuclear med)  3rd Floor: - Vacant  4th Floor: - TCTS (cardiothoracic surgery) - AFib Clinic - Structural Heart Clinic - Vascular Surgery  - Vascular Ultrasound  5th Floor: - HeartCare Cardiology  (general and EP) - Clinical Pharmacy for coumadin, hypertension, lipid, weight-loss medications, and med management appointments    Valet parking services will be available as well.

## 2024-01-06 LAB — BASIC METABOLIC PANEL
BUN/Creatinine Ratio: 19 (ref 12–28)
BUN: 21 mg/dL (ref 8–27)
CO2: 24 mmol/L (ref 20–29)
Calcium: 9.5 mg/dL (ref 8.7–10.3)
Chloride: 105 mmol/L (ref 96–106)
Creatinine, Ser: 1.11 mg/dL — ABNORMAL HIGH (ref 0.57–1.00)
Glucose: 123 mg/dL — ABNORMAL HIGH (ref 70–99)
Potassium: 4.4 mmol/L (ref 3.5–5.2)
Sodium: 145 mmol/L — ABNORMAL HIGH (ref 134–144)
eGFR: 50 mL/min/{1.73_m2} — ABNORMAL LOW (ref >59)

## 2024-01-28 ENCOUNTER — Ambulatory Visit: Payer: Medicare PPO

## 2024-01-28 DIAGNOSIS — I5022 Chronic systolic (congestive) heart failure: Secondary | ICD-10-CM | POA: Diagnosis not present

## 2024-01-28 LAB — CUP PACEART REMOTE DEVICE CHECK
Battery Remaining Longevity: 57 mo
Battery Voltage: 2.98 V
Brady Statistic AP VP Percent: 19.86 %
Brady Statistic AP VS Percent: 0.53 %
Brady Statistic AS VP Percent: 77.94 %
Brady Statistic AS VS Percent: 1.67 %
Brady Statistic RA Percent Paced: 20.33 %
Brady Statistic RV Percent Paced: 2.93 %
Date Time Interrogation Session: 20250313001703
HighPow Impedance: 49 Ohm
Implantable Lead Connection Status: 753985
Implantable Lead Connection Status: 753985
Implantable Lead Connection Status: 753985
Implantable Lead Implant Date: 20200804
Implantable Lead Implant Date: 20200804
Implantable Lead Implant Date: 20200804
Implantable Lead Location: 753858
Implantable Lead Location: 753859
Implantable Lead Location: 753860
Implantable Lead Model: 4298
Implantable Lead Model: 5076
Implantable Lead Model: 6935
Implantable Pulse Generator Implant Date: 20200804
Lead Channel Impedance Value: 147.097
Lead Channel Impedance Value: 147.097
Lead Channel Impedance Value: 147.097
Lead Channel Impedance Value: 152 Ohm
Lead Channel Impedance Value: 152 Ohm
Lead Channel Impedance Value: 285 Ohm
Lead Channel Impedance Value: 304 Ohm
Lead Channel Impedance Value: 304 Ohm
Lead Channel Impedance Value: 304 Ohm
Lead Channel Impedance Value: 342 Ohm
Lead Channel Impedance Value: 361 Ohm
Lead Channel Impedance Value: 399 Ohm
Lead Channel Impedance Value: 456 Ohm
Lead Channel Impedance Value: 475 Ohm
Lead Channel Impedance Value: 475 Ohm
Lead Channel Impedance Value: 475 Ohm
Lead Channel Impedance Value: 513 Ohm
Lead Channel Impedance Value: 513 Ohm
Lead Channel Pacing Threshold Amplitude: 0.625 V
Lead Channel Pacing Threshold Amplitude: 0.625 V
Lead Channel Pacing Threshold Amplitude: 1.125 V
Lead Channel Pacing Threshold Pulse Width: 0.4 ms
Lead Channel Pacing Threshold Pulse Width: 0.4 ms
Lead Channel Pacing Threshold Pulse Width: 0.4 ms
Lead Channel Sensing Intrinsic Amplitude: 1.625 mV
Lead Channel Sensing Intrinsic Amplitude: 1.625 mV
Lead Channel Sensing Intrinsic Amplitude: 27.25 mV
Lead Channel Sensing Intrinsic Amplitude: 27.25 mV
Lead Channel Setting Pacing Amplitude: 1.5 V
Lead Channel Setting Pacing Amplitude: 2 V
Lead Channel Setting Pacing Amplitude: 2.5 V
Lead Channel Setting Pacing Pulse Width: 0.4 ms
Lead Channel Setting Pacing Pulse Width: 0.4 ms
Lead Channel Setting Sensing Sensitivity: 0.3 mV
Zone Setting Status: 755011
Zone Setting Status: 755011

## 2024-03-11 NOTE — Progress Notes (Signed)
 Remote ICD transmission.

## 2024-04-13 ENCOUNTER — Ambulatory Visit: Payer: Medicare PPO | Attending: Cardiology | Admitting: Cardiology

## 2024-04-13 ENCOUNTER — Encounter: Payer: Self-pay | Admitting: Cardiology

## 2024-04-13 VITALS — BP 140/64 | HR 56 | Resp 16 | Ht 62.0 in | Wt 192.2 lb

## 2024-04-13 DIAGNOSIS — I2510 Atherosclerotic heart disease of native coronary artery without angina pectoris: Secondary | ICD-10-CM | POA: Diagnosis not present

## 2024-04-13 DIAGNOSIS — I10 Essential (primary) hypertension: Secondary | ICD-10-CM

## 2024-04-13 DIAGNOSIS — I5032 Chronic diastolic (congestive) heart failure: Secondary | ICD-10-CM

## 2024-04-13 DIAGNOSIS — Z95810 Presence of automatic (implantable) cardiac defibrillator: Secondary | ICD-10-CM

## 2024-04-13 DIAGNOSIS — I480 Paroxysmal atrial fibrillation: Secondary | ICD-10-CM | POA: Diagnosis not present

## 2024-04-13 DIAGNOSIS — E7800 Pure hypercholesterolemia, unspecified: Secondary | ICD-10-CM

## 2024-04-13 MED ORDER — ISOSORBIDE MONONITRATE ER 120 MG PO TB24: 120.0000 mg | ORAL_TABLET | Freq: Every day | ORAL | 3 refills | Status: DC

## 2024-04-13 NOTE — Progress Notes (Signed)
 Cardiology Office Note:  .   Date:  04/13/2024  ID:  Sara Graves, DOB 02-26-1941, MRN [id number] PCP:  Alayne Hubert  Former Cardiology Providers: Dr. Knox Perl Nephrologist: Dr. Adrian Alba [facility] Providers Cardiologist:  Olinda Bertrand, DO , [facility]  Electrophysiologist:  None  Click to update primary MD,subspecialty MD or APP then REFRESH:1}    Chief Complaint  Patient presents with   Chronic combined systolic and diastolic heart failure, NYHA   Follow-up    History of Present Illness: .   Sara Graves is a 83 y.o. 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  Patient is being followed by the practice given her history of paroxysmal atrial fibrillation, coronary artery disease prior coronary interventions, heart failure with improved EF, and status post BiV ICD implant in August 2020.  She was last seen in the office in November 2024 and presents today for follow-up.  From a cardiovascular standpoint she denies anginal chest pain or heart failure symptoms. No hospitalizations for congestive heart failure. Follows up with her PCP regularly. Patient states that she was referred to nephrology and has establish care with Dr. Higinio Love and had extensive labs in May 2025 results are not available for me to review at this time. Home blood pressures range between 130-140 mmHg and DBP ranges between 60-75 mmHg on current medical therapy. She recently saw EP for her annual follow-up visit given her BiV ICD. Patient does not endorse evidence of bleeding.   Review of Systems: .   Review of Systems  Cardiovascular:  Negative for chest pain, claudication, irregular heartbeat,  leg swelling, near-syncope, orthopnea, palpitations, paroxysmal nocturnal dyspnea and syncope.  Respiratory:  Negative for shortness of breath.   Hematologic/Lymphatic: Negative for bleeding problem.    Studies Reviewed:   EKG: EKG Interpretation Date/Time:  Wednesday Apr 13 2024 10:13:49 EDT Text Interpretation: Atrial-sensed ventricular-paced rhythm When compared with ECG of 06-Oct-2023 11:45, Vent. rate has decreased BY   7 BPM No significant change since last tracing Confirmed by Olinda Bertrand [phone]) on 04/13/2024 10:18:18 AM  Echocardiogram: 02/12/2021: LVEF 20-25%, global hypokinesis, LV size dilated, right ventricular systolic function moderately reduced in size dilated, estimated RAP 68 mmHg, left severe LAE, moderate RAE, moderate to severe MR/TR see report for additional details  May 2023: LVEF 63%, mild AR, moderate MR, RVSP 34 mmHg.   Heart catheterization: Coronary angiogram 02/08/2019:  Distal left main 20% diffuse disease.  Ostial LAD stent shows diffuse 20% in-stent restenosis (3.0 x 15 mm resolute Onyx on 12/29/2017).  Mid LAD and mid to distal LAD there are tandem lesions 80% and 95%.  Ramus intermediate ostial 95 to 99% stenosis.  Previously placed RI stent widely patent ( 2.0 x 12 mm resolute on 04/26/2018).  30 to 40% tandem lesions in the RCA. Cutting Balloon angioplasty of the ramus intermediate ostial high-grade stenosis, 99% reduced to 0% and Cutting Balloon angioplasty of the mid LAD followed by stenting with 2.5 x 32 mm Synergy DES for high-grade 90% stenosis reduced to 0% and TIMI-3 to TIMI-3 flow maintained in both lesions.   Carotid duplex December 2024: Right Carotid: Velocities in the right ICA are consistent with a 1-39% stenosis.   Left Carotid: Velocities in the left ICA are  consistent with a 1-39% stenosis.   Vertebrals:  Bilateral vertebral arteries demonstrate antegrade flow. Subclavians: Right subclavian artery flow was disturbed. Normal flow  hemodynamics were seen in the left subclavian artery. No             significant BP differential between arms.   Remote CRT-D transmission 01/2024 Scheduled remote reviewed. Normal device function.  Presenting rhythm:  AS/BiVP  30 AMS, AF burden is 1%, Eliquis  per EMR, longest 2 hrs 18 min   RADIOLOGY: N/A  Risk Assessment/Calculations:   Click Here to Calculate/Change CHADS2VASc Score The patient's CHADS2-VASc score is 7, indicating a 11.2% annual risk of stroke.   CHF History: Yes HTN History: Yes Diabetes History: Yes Stroke History: No Vascular Disease History: Yes  Labs:       Latest Ref Rng & Units 10/06/2023    1:09 PM 06/07/2021   11:53 AM 02/12/2021    3:19 AM  CBC  WBC 4.0 - 10.5 K/uL  5.0  4.1   Hemoglobin 11.1 - 15.9 g/dL 65.7  84.6  96.2   Hematocrit 34.0 - 46.6 % 36.6  36.6  36.5   Platelets 150 - 400 K/uL  102  92        Latest Ref Rng & Units 01/05/2024    1:17 PM 11/09/2023    7:57 AM 10/06/2023    1:09 PM  BMP  Glucose 70 - 99 mg/dL 952  91  841   BUN 8 - 27 mg/dL 21  22  15    Creatinine 0.57 - 1.00 mg/dL 3.24  4.01  0.27   BUN/Creat Ratio 12 - 28 19  18  14    Sodium 134 - 144 mmol/L 145  144  146   Potassium 3.5 - 5.2 mmol/L 4.4  4.6  3.2   Chloride 96 - 106 mmol/L 105  104  101   CO2 20 - 29 mmol/L 24  26  28    Calcium  8.7 - 10.3 mg/dL 9.5  9.1  8.6       Latest Ref Rng & Units 01/05/2024    1:17 PM 11/09/2023    7:57 AM 10/06/2023    1:09 PM  CMP  Glucose 70 - 99 mg/dL 253  91  664   BUN 8 - 27 mg/dL 21  22  15    Creatinine 0.57 - 1.00 mg/dL 4.03  4.74  2.59   Sodium 134 - 144 mmol/L 145  144  146   Potassium 3.5 - 5.2 mmol/L 4.4  4.6  3.2   Chloride 96 - 106 mmol/L 105  104  101   CO2 20 - 29 mmol/L 24  26  28    Calcium  8.7 - 10.3 mg/dL 9.5  9.1  8.6           Physical Exam:    Today's Vitals   04/13/24 1010  BP: (!) 140/64  Pulse: (!) 56  Resp: 16  SpO2: 96%  Weight: 192 lb 3.2 oz (87.2 kg)  Height: 5\' 2"  (1.575 m)    Body mass index is 35.15 kg/m. Wt Readings from Last 3 Encounters:  04/13/24 192 lb 3.2 oz (87.2 kg)  01/05/24 193 lb 12.8 oz (87.9 kg)  12/22/23 193 lb 6.4 oz (87.7 kg)    Physical Exam  Constitutional: No distress. She appears chronically ill.  hemodynamically stable, appears older than stated age,  Neck: No JVD present.  Cardiovascular: Normal rate, regular rhythm, S1 normal and S2 normal. Exam reveals no gallop,  no S3 and no S4.  Murmur heard. Holosystolic murmur is present with a grade of 3/6 at the apex. Pulses:      Carotid pulses are  on the right side with bruit and  on the left side with bruit. Pulmonary/Chest: Effort normal and breath sounds normal. No stridor. She has no wheezes. She has no rales.  Device site is clean dry and intact.  Abdominal: Soft. Bowel sounds are normal. She exhibits no distension. There is no abdominal tenderness.  Musculoskeletal:        General: Edema (Trace bilateral) present.     Cervical back: Neck supple.  Neurological: She is alert and oriented to person, place, and time. She has intact cranial nerves (2-12).  Skin: Skin is warm.    Impression & Recommendation(s):  Impression:   ICD-10-CM   1. Coronary artery disease involving native coronary artery of native heart without angina pectoris  I25.10 isosorbide  mononitrate (IMDUR ) 120 MG 24 hr tablet    2. Heart failure with improved ejection fraction (HFimpEF) (HCC)  I50.32 EKG 12-Lead    isosorbide  mononitrate (IMDUR ) 120 MG 24 hr tablet    3. ICD Medtronic Claria MRI CRTD 06/21/2019  Z95.810     4. Paroxysmal atrial fibrillation (HCC)  I48.0     5. Essential hypertension  I10     6. Pure hypercholesterolemia  E78.00        Recommendation(s):  Coronary artery disease involving native coronary artery of native heart without angina pectoris Heart failure with improved ejection fraction (HFimpEF) (HCC) Stage C, NYHA class II Echocardiogram March 2022: LVEF  20-25%. Echocardiogram May 2023: LVEF 63%. Currently on Isordil  40 mg p.o. 3 times daily. Continue Toprol -XL 50 mg p.o. daily. Continue spironolactone  25 mg p.o. daily. Continue torsemide  20 mg p.o. on as needed basis. Will transition her from Isordil  40 mg p.o. 3 times daily to Imdur  120 mg p.o. daily At the last office visit she was on Entresto  97/103 mg p.o. twice daily but no longer on it, patient does not recall why.  She will discuss with PCP.  ICD Medtronic Claria MRI CRTD 06/21/2019 Recently saw Dr. Carolynne Citron for her device check.  Paroxysmal atrial fibrillation (HCC) Rate control: Metoprolol . Rhythm control: N/A. Thromboembolic prophylaxis: Eliquis  Patient does not endorse evidence of bleeding. Risks, benefits, alternatives to oral anticoagulation discussed Last device check also reillustrated episodes of PAF longest being 2 hours and 18 minutes   Coronary artery disease involving native coronary artery of native heart without angina pectoris Denies anginal chest pain. No use of sublingual nitroglycerin  tablets. Will refill nitro tablets. EKG shows atrial sensed ventricular paced rhythm Reemphasized the importance of secondary prevention with focus on improving her modifiable cardiovascular risk factors such as glycemic control, lipid management, blood pressure control, weight loss.  Essential hypertension Office blood pressure is acceptable. Home blood pressures are better controlled. Medications as discussed above.  Pure hypercholesterolemia Currently on Repatha , Zetia  10 mg p.o. daily, and Crestor  40 mg p.o. nightly. Outside labs independently reviewed which illustrates an LDL of 114 mg/dL. Patient is advised to be more cognizant of foods that are high in cholesterol content as LDL level should be better controlled as she is on maximally tolerated doses of both oral and injectable medications.  Orders Placed:  Orders Placed This Encounter  Procedures   EKG 12-Lead     Final Medication List:    Meds ordered this encounter  Medications   isosorbide  mononitrate (IMDUR ) 120 MG 24 hr tablet  Sig: Take 1 tablet (120 mg total) by mouth daily.    Dispense:  30 tablet    Refill:  3    Medications Discontinued During This Encounter  Medication Reason   isosorbide  dinitrate (ISORDIL ) 40 MG tablet Discontinued by provider      Current Outpatient Medications:    alendronate  (FOSAMAX ) 70 MG tablet, Take 1 tablet (70 mg total) by mouth once a week., Disp: 4 tablet, Rfl: 1   apixaban  (ELIQUIS ) 5 MG TABS tablet, Take 1 tablet (5 mg total) by mouth 2 (two) times daily., Disp: 60 tablet, Rfl: 3   BD PEN NEEDLE NANO 2ND GEN 32G X 4 MM MISC, , Disp: , Rfl:    busPIRone  (BUSPAR ) 5 MG tablet, Take 5 mg by mouth 2 (two) times daily. As needed, Disp: , Rfl:    Calcium  Carb-Cholecalciferol (CALCIUM  600 + D PO), Take by mouth., Disp: , Rfl:    Continuous Glucose Sensor (FREESTYLE LIBRE 3 SENSOR) MISC, Place onto the skin., Disp: , Rfl:    denosumab (XGEVA) 120 MG/1.7ML SOLN injection, Inject 120 mg into the skin., Disp: , Rfl:    ezetimibe  (ZETIA ) 10 MG tablet, Take 10 mg by mouth daily., Disp: , Rfl:    FLUoxetine  (PROZAC ) 10 MG capsule, Take 10 mg by mouth daily., Disp: , Rfl:    gabapentin  (NEURONTIN ) 100 MG capsule, Take 100 mg by mouth at bedtime., Disp: , Rfl:    glucose blood test strip, 1 each 3 (three) times daily. , Disp: , Rfl:    insulin  aspart (NOVOLOG  FLEXPEN) 100 UNIT/ML FlexPen, Inject into the skin 3 (three) times daily with meals. 2-4 units, Disp: , Rfl:    insulin  degludec (TRESIBA) 100 UNIT/ML FlexTouch Pen, Inject into the skin daily. 4-5 Units, Disp: , Rfl:    insulin  glargine (LANTUS  SOLOSTAR) 100 UNIT/ML Solostar Pen, Inject into the skin., Disp: , Rfl:    isosorbide  mononitrate (IMDUR ) 120 MG 24 hr tablet, Take 1 tablet (120 mg total) by mouth daily., Disp: 30 tablet, Rfl: 3   levothyroxine  (SYNTHROID , LEVOTHROID) 50 MCG tablet, Take 50 mcg by  mouth daily before breakfast. , Disp: , Rfl:    Magnesium  Oxide 500 MG TABS, TAKE 1 TABLET (500 MG TOTAL) BY MOUTH IN THE MORNING AND AT BEDTIME. (Patient taking differently: Take 500 mg by mouth in the morning, at noon, and at bedtime.), Disp: 180 tablet, Rfl: 1   methocarbamol  (ROBAXIN ) 500 MG tablet, Take 500 mg by mouth 4 (four) times daily., Disp: , Rfl:    metoprolol  succinate (TOPROL -XL) 50 MG 24 hr tablet, Take 1 tablet (50 mg total) by mouth daily. Take with or immediately following a meal., Disp: 90 tablet, Rfl: 3   nitroGLYCERIN  (NITROSTAT ) 0.4 MG SL tablet, Place 1 tablet (0.4 mg total) under the tongue every 5 (five) minutes as needed for chest pain., Disp: 30 tablet, Rfl: 0   ONETOUCH VERIO test strip, 1 each by Other route 3 (three) times daily., Disp: , Rfl: 5   prednisoLONE acetate (PRED FORTE) 1 % ophthalmic suspension, Apply to eye., Disp: , Rfl:    REPATHA  SURECLICK 140 MG/ML SOAJ, Inject into the skin., Disp: , Rfl:    spironolactone  (ALDACTONE ) 25 MG tablet, Take 1 tablet (25 mg total) by mouth daily., Disp: 90 tablet, Rfl: 3   torsemide  (DEMADEX ) 20 MG tablet, Take 1 tablet (20 mg total) by mouth as needed., Disp: 90 tablet, Rfl: 3   vitamin B-12 (CYANOCOBALAMIN ) 1000 MCG tablet, Take 1,000  mcg by mouth daily. , Disp: , Rfl:    Vitamin D , Ergocalciferol , (DRISDOL ) 50000 units CAPS capsule, Take 50,000 Units by mouth every 7 (seven) days. Take on Thursdays, Disp: , Rfl:    potassium chloride  SA (KLOR-CON  M20) 20 MEQ tablet, Take 2 tablets (40 mEq total) by mouth daily., Disp: 90 tablet, Rfl: 3   rosuvastatin  (CRESTOR ) 40 MG tablet, Take 1 tablet (40 mg total) by mouth at bedtime., Disp: 90 tablet, Rfl: 3  Consent:   N/A  Disposition:   11-month follow-up with APP with management of atrial fibrillation and heart failure with improved EF management 56-month follow-up with myself. Patient may be asked to follow-up sooner based on the results of the above-mentioned  testing.  Her questions and concerns were addressed to her satisfaction. She voices understanding of the recommendations provided during this encounter.    Signed, Awilda Bogus, [facility]  A Division of [facility] [address], [place], [place] [address]  [place], [place] [address]  04/13/2024 1:19 PM

## 2024-04-13 NOTE — Patient Instructions (Addendum)
 Medication Instructions:  STOP Isosorbide  Dinitrate (Isordil )   START Isosorbide  Mononitrate (Imdur ) 120 mg once daily   *If you need a refill on your cardiac medications before your next appointment, please call your pharmacy*  Lab Work: None ordered today. If you have labs (blood work) drawn today and your tests are completely normal, you will receive your results only by: MyChart Message (if you have MyChart) OR A paper copy in the mail If you have any lab test that is abnormal or we need to change your treatment, we will call you to review the results.  Testing/Procedures: None ordered today.  Follow-Up: At [facility], you and your health needs are our priority.  As part of our continuing mission to provide you with exceptional heart care, our providers are all part of one team.  This team includes your primary Cardiologist (physician) and Advanced Practice Providers or APPs (Physician Assistants and Nurse Practitioners) who all work together to provide you with the care you need, when you need it.  Your next appointment:   6 month(s)  Provider:   Lovette Rud, PA-C. Then, Sunit Tolia, DO will plan to see you again in 1 year(s).

## 2024-04-28 ENCOUNTER — Ambulatory Visit (INDEPENDENT_AMBULATORY_CARE_PROVIDER_SITE_OTHER)

## 2024-04-28 DIAGNOSIS — I255 Ischemic cardiomyopathy: Secondary | ICD-10-CM | POA: Diagnosis not present

## 2024-04-29 LAB — CUP PACEART REMOTE DEVICE CHECK
Battery Remaining Longevity: 52 mo
Battery Voltage: 2.98 V
Brady Statistic AP VP Percent: 15.48 %
Brady Statistic AP VS Percent: 0.39 %
Brady Statistic AS VP Percent: 82.46 %
Brady Statistic AS VS Percent: 1.67 %
Brady Statistic RA Percent Paced: 15.83 %
Brady Statistic RV Percent Paced: 2.06 %
Date Time Interrogation Session: 20250612063425
HighPow Impedance: 49 Ohm
Implantable Lead Connection Status: 753985
Implantable Lead Connection Status: 753985
Implantable Lead Connection Status: 753985
Implantable Lead Implant Date: 20200804
Implantable Lead Implant Date: 20200804
Implantable Lead Implant Date: 20200804
Implantable Lead Location: 753858
Implantable Lead Location: 753859
Implantable Lead Location: 753860
Implantable Lead Model: 4298
Implantable Lead Model: 5076
Implantable Lead Model: 6935
Implantable Pulse Generator Implant Date: 20200804
Lead Channel Impedance Value: 123.5 Ohm
Lead Channel Impedance Value: 132.321
Lead Channel Impedance Value: 132.321
Lead Channel Impedance Value: 132.321
Lead Channel Impedance Value: 142.5 Ohm
Lead Channel Impedance Value: 247 Ohm
Lead Channel Impedance Value: 247 Ohm
Lead Channel Impedance Value: 285 Ohm
Lead Channel Impedance Value: 285 Ohm
Lead Channel Impedance Value: 342 Ohm
Lead Channel Impedance Value: 361 Ohm
Lead Channel Impedance Value: 361 Ohm
Lead Channel Impedance Value: 456 Ohm
Lead Channel Impedance Value: 456 Ohm
Lead Channel Impedance Value: 456 Ohm
Lead Channel Impedance Value: 456 Ohm
Lead Channel Impedance Value: 475 Ohm
Lead Channel Impedance Value: 475 Ohm
Lead Channel Pacing Threshold Amplitude: 0.75 V
Lead Channel Pacing Threshold Amplitude: 0.75 V
Lead Channel Pacing Threshold Amplitude: 1 V
Lead Channel Pacing Threshold Pulse Width: 0.4 ms
Lead Channel Pacing Threshold Pulse Width: 0.4 ms
Lead Channel Pacing Threshold Pulse Width: 0.4 ms
Lead Channel Sensing Intrinsic Amplitude: 1.625 mV
Lead Channel Sensing Intrinsic Amplitude: 1.625 mV
Lead Channel Sensing Intrinsic Amplitude: 31.625 mV
Lead Channel Sensing Intrinsic Amplitude: 31.625 mV
Lead Channel Setting Pacing Amplitude: 1.5 V
Lead Channel Setting Pacing Amplitude: 2 V
Lead Channel Setting Pacing Amplitude: 2.5 V
Lead Channel Setting Pacing Pulse Width: 0.4 ms
Lead Channel Setting Pacing Pulse Width: 0.4 ms
Lead Channel Setting Sensing Sensitivity: 0.3 mV
Zone Setting Status: 755011
Zone Setting Status: 755011

## 2024-05-01 ENCOUNTER — Ambulatory Visit: Payer: Self-pay | Admitting: Internal Medicine

## 2024-05-19 NOTE — ED Provider Notes (Signed)
 [facility]  ED Provider Note  Sara Graves 83 y.o. female DOB: 05/20/1941 MRN: [id number] History   Chief Complaint  Patient presents with  . Vomiting    Nasuea vomiting for several days. Pcp sent for R/O gallstones. RUQ and RLQ pain   Sara Graves developed diffuse abdominal pain 6 days ago after eating.  Symptoms have been persistent since that time and have been worsening.  Some nausea.  No vomiting.  Some diarrhea.  No dysuria.   History provided by:  Patient Vomiting      Past Medical History:  Diagnosis Date  . Anxiety and depression   . Back pain   . Baker's cyst 11/04/2016   left leg  . Carpal tunnel syndrome    bilateral  . Colon polyp   . Coronary artery disease   . Diabetes mellitus (*)   . Diabetes mellitus, type 2 (*)   . Disease of thyroid  gland   . Hyperlipidemia   . Hypertension   . Thrombocytopenia 08/04/2017  . Trigger finger     Past Surgical History:  Procedure Laterality Date  . Back surgery    . Cardiac defibrillator placement    . Cardiac pacemaker placement    . Colonoscopy  2016  . Coronary angioplasty with stent placement    . Hand surgery Right   . Hysterectomy    . Replacement total knee Right   . Rotator cuff repair Right   . Spine surgery    . Upper gastrointestinal endoscopy  07/18/2016  . Upper gastrointestinal endoscopy  06/10/2017    Social History   Substance and Sexual Activity  Alcohol Use No   Tobacco Use History[1] E-Cigarettes  . Vaping Use Never User   . Start Date    . Cartridges/Day    . Quit Date     Social History   Substance and Sexual Activity  Drug Use No         Allergies[2]  Discharge Medication List as of 05/19/2024  9:12 PM     CONTINUE these medications which have NOT CHANGED   Details  ACCU-CHEK SOFTCLIX LANCETS lancets Used as directed to monitor glucose up to 3x daily with compatible Accu Chek Softclix lancet device, Normal    apixaban   (ELIQUIS ) 5 mg tablet Take one tablet (5 mg dose) by mouth., Starting Tue 09/01/2023, Historical Med    Blood Glucose Monitoring Suppl (ACCU-CHEK GUIDE) w/Device KIT Use 1 kit as directed to monitor glucose up to 6x daily, Normal    Calcium  Carb-Cholecalciferol 600-200 MG-UNIT TABS Take 2 tablets by mouth daily., Historical Med    Continuous Glucose Sensor (FREESTYLE LIBRE 3 SENSOR) MISC Place 1 Device onto the skin every 14 (fourteen) days., Starting Mon 02/29/2024, Until Tue 02/28/2025, Normal    [employer] ALLERGY 25 MG capsule TAKE 2 CAPSULES BY MOUTH AT BEDTIME AS NEEDED, Historical Med    ergocalciferol  (VITAMIN D2) 50,000 units CAPS capsule Take one capsule (50,000 Units dose) by mouth once a week. Once a week for 12 weeks, Starting Tue 03/08/2024, Normal    evolocumab  (REPATHA  SURECLICK) 140 mg/mL SOAJ pen injection Inject 1 pen (140 mg dose) under the skin every 14  days., Starting Tue 01/19/2024, Normal    FLUoxetine  (PROZAC ) 10 mg capsule TAKE ONE CAPSULE BY MOUTH DAILY., Starting Thu 12/31/2023, Normal    glucose blood (ACCU-CHEK GUIDE) test strip Use as directed to monitor glucose up to 3x daily. Pharmacy, please dispense this brand of blood  glucose test strips: Accu-Chek Guide, Normal    Insulin  Pen Needle (BD PEN NEEDLE NANO 2ND GEN) 32G X 4 MM MISC Use 1 pen needle as directed to inject insulin  up to 4 times daily, Normal    isosorbide  dinitrate (ISORDIL ) 40 mg tablet Take one tablet (40 mg dose) by mouth 3 (three) times a day., Starting Wed 07/15/2023, Historical Med    levothyroxine  sodium (SYNTHROID ,LEVOTHROID,LOVOXYL) 50 mcg tablet Take one tablet (50 mcg dose) by mouth daily., Starting Tue 08/04/2023, Normal    linaGLIPtin (TRADJENTA) 5 mg tablet Take one tablet (5 mg dose) by mouth daily., Starting Tue 05/10/2024, Normal    methylPREDNISolone (MEDROL DOSEPACK) 4 mg tablet follow package directions, Normal    metoprolol  succinate (TOPROL -XL) 50 mg 24 hr tablet Take one tablet (50 mg  dose) by mouth daily., Starting Fri 05/24/2021, Historical Med    nitroGLYCERIN  (NITROSTAT ) 0.4 mg SL tablet Place one tablet (0.4 mg dose) under the tongue every 5 (five) minutes as needed for Chest pain., Historical Med    ofloxacin (OCUFLOX) 0.3% ophthalmic solution Historical Med    prednisoLONE acetate (PRED FORTE,ECONOPRED) 1% ophthalmic suspension SMARTSIG:In Eye(s), Historical Med    rosuvastatin  calcium  (CRESTOR ) 40 mg tablet Take one tablet (40 mg dose) by mouth at bedtime., Starting Mon 12/14/2023, Normal    sacubitril -valsartan  (ENTRESTO ) 97-103 mg TABS per tablet Take one tablet by mouth 2 (two) times daily. Through PAP, Historical Med    spironolactone  (ALDACTONE ) 25 mg tablet Take one tablet (25 mg dose) by mouth daily., Starting Tue 12/08/2023, Normal    torsemide  (DEMADEX ) 20 mg tablet Take one tablet (20 mg dose) by mouth daily., Starting Wed 10/07/2023, Historical Med    vitamin B-12 (CYANOCOBALAMIN ) 1000 mcg tablet Take one tablet (1,000 mcg dose) by mouth daily., Starting Fri 05/18/2020, Normal        Primary Survey  Primary Survey  Review of Systems   Review of Systems  Constitutional:  Negative for fever.  Skin:        Blister on left lower leg that has been receding but is still present    Physical Exam   ED Triage Vitals [05/19/24 1503]  BP 142/76  Heart Rate 57  Resp 20  SpO2 93 %  Temp 97.8 F (36.6 C)    Physical Exam  Nursing note and vitals reviewed. Constitutional: She appears well-developed and well-nourished. She no respiratory distress.  HENT:  Head: Normocephalic.  Eyes: Conjunctivae are normal. Right eye: no drainage. Left eye: no drainage.  Pulmonary/Chest: No respiratory distress. Respiratory effort normal.  Abdominal: Soft. There is severe abdominal tenderness in the right upper quadrant. Abdomen not distended.  Musculoskeletal: No obvious deformity noted to extremities.     Comments: Small blister on the anterior aspect of the left  tibia at the junction of the middle and distal thirds.  Minimal surrounding erythema.  No fluctuance.   Neurological: She is alert and oriented to person, place, and time. She has normal speech.  Skin: Skin is warm. Skin is dry.  Psychiatric: She has a normal mood and affect. Her behavior is normal.     ED Course   Lab results:   CBC AND DIFFERENTIAL - Abnormal      Result Value   WBC 8.9     RBC 4.10     HGB 12.5     HCT 37.5     MCV 91.5     MCH 30.5     MCHC 33.3  Plt Ct 116 (*)    Comment: Result repeated and confirmed    RDW SD 45.7     MPV 11.6     NRBC% 0.0     Absolute NRBC Count 0.00     NEUTROPHIL % 68.2     LYMPHOCYTE % 19.8     MONOCYTE % 9.1     Eosinophil % 2.1     BASOPHIL % 0.3     IG% 0.5     ABSOLUTE NEUTROPHIL COUNT 6.04     ABSOLUTE LYMPHOCYTE COUNT 1.75     Absolute Monocyte Count 0.81 (*)    Absolute Eosinophil Count 0.19     Absolute Basophil Count 0.03     Absolute Immature Granulocyte Count 0.04 (*)   COMPREHENSIVE METABOLIC PANEL - Abnormal   Na 141     Potassium 3.2 (*)    Cl 102     CO2 26     AGAP 13     Glucose 139 (*)    BUN 23     Creatinine 1.04 (*)    Ca 9.0     ALK PHOS 69     T Bili 0.7     Total Protein 6.4     Alb 3.8     GLOBULIN 2.6     ALBUMIN/GLOBULIN RATIO 1.5     BUN/CREAT RATIO 22.1     ALT 8     AST 10     eGFR 53 (*)    Comment: Normal GFR (glomerular filtration rate) > 60 mL/min/1.73 meters squared, < 60 may include impaired kidney function. Calculation based on the Chronic Kidney Disease Epidemiology Collaboration (CK-EPI)equation refit without adjustment for race.  URINALYSIS W/MICRO REFLEX CULTURE - SYMPTOMATIC - Abnormal   Urine Color Yellow     Urine Clarity Clear     Urine Specific Gravity 1.010     Urine pH 6.5     Urine Protein - Dipstick 300 (*)    Urine Glucose 30 (*)    Urine Ketones Negative     Urine Bilirubin Negative     Urine Blood 0.1 (*)    Urine Nitrite Negative     Urine  Urobilinogen <2     Urine Leukocyte Esterase Negative     Urine Squamous Epithelial Cells 3-4 (*)    Urine WBC Rare     Urine RBC 0-2     UA Microscopic Yes Micro (*)    Narrative:    Does not meet criteria for reflex to Urine Culture.  LIGHT BLUE TOP  GOLD SST    Imaging:   US  GALLBLADDER   Narrative:    TECHNIQUE: Realtime multiplanar grayscale and limited color Doppler ultrasound of the right upper quadrant was performed. COMPARISON: CT abdomen pelvis September 30, 2023.   INDICATION: Epigastric Pain  FINDINGS:  PANCREAS:  No focal abnormalities are identified. Visualization is limited.  VASCULATURE: No abdominal aortic aneurysm. Visualized IVC is patent. Portal vein is patent with normal flow direction.  HEPATOBILIARY: Liver: Normal.   Gallbladder: Unremarkable. CBD: Normal. No intrahepatic biliary ductal dilatation.  RIGHT KIDNEY: Right kidney is of normal size. No hydronephrosis. No suspicious masses. Normal renal echotexture.  MISC: N./A.    Impression:    IMPRESSION: 1.  No acute findings.  Electronically Signed by: Rodgers Closs on 05/19/2024 5:19 PM  CT ABDOMEN PELVIS W IV CONTRAST   Narrative:    TECHNIQUE: Abdomen pelvic CT with contrast. 72 cc Isovue -370 IV. Multiple contiguous axial images obtained.  Comparison studies 09/30/2023 and 03/06/2024.  HISTORY: Abdomen pain.  FINDINGS: Small left pleural effusion and basilar atelectasis/infiltrate. Pacemaker in place. Spinal stimulator in place.  Liver, spleen, pancreas, gallbladder, adrenal glands and kidneys stable. A few vascular calcifications. No adenopathy. Nonspecific bowel gas pattern. Appendix not seen.  Absent uterus. No adnexal mass. Bladder decompressed.  No acute bony abnormality identified.    Impression:    IMPRESSION: 1. Right pleural effusion and basilar atelectasis/infiltrate. Would recommend serial chest x-rays to follow this process to resolution. 2. Pacemaker. 3. Nonspecific  bowel gas pattern.  Electronically Signed by: Reyes People, MD on 05/19/2024 6:47 PM  XR CHEST AP PORTABLE   Narrative:    TECHNIQUE: Portable AP chest. Comparison study 01/03/2022.  HISTORY: Vomiting. Pleural effusion  FINDINGS: Image obtained in mid expiration. Cardiac size stable. Stable left-sided pacemaker. Left pleural effusion and basilar infiltrate/atelectasis. Spinal stimulator in place.    Impression:    IMPRESSION: Left pleural effusion and basilar infiltrate/atelectasis. Would recommend serial chest x-rays to follow this process to resolution.  Electronically Signed by: Reyes People, MD on 05/19/2024 8:18 PM      ECG: ECG Results          ECG 12 lead (Final result)  Result time 05/19/24 20:02:48    Final result             Narrative:   Diagnosis Class Abnormal Acquisition Device D3K Ventricular Rate 57 Atrial Rate 57 P-R Interval 126 QRS Duration 156 Q-T Interval 512 QTC Calculation(Bazett) 498 Calculated P Axis 10 Calculated R Axis 51 Calculated T Axis 151  Diagnosis Sinus bradycardia Left bundle branch block Abnormal ECG When compared with ECG of 15-May-2020 19:06, Nonspecific T wave abnormality now evident in Inferior leads T wave inversion more evident in Lateral leads no ischemia within limits of left bundle branch block Brien Kung ([id number]) on 05/19/2024 8:02:39 PM certifies that he/she has reviewed the ECG tracing and confirms the independent  interpretation is correct.                                                                                       Pre-Sedation Procedures  ED Course as of 05/20/24 0720  Kung MATSU Wright's Documentation  Thu May 19, 2024  1919 I updated the patient on the findings of pleural effusions on her abdominal CT.  This could be causing her right upper quadrant abdominal pain.  I told her that she also has some decrease in her O2 saturations.  She told me that she uses  oxygen at home and should have told somebody when she arrived.  We will move her into a room that has oxygen availability.   Medical Decision Making Sara Graves presents to the ED with diffuse abdominal pain.  Symptoms did seem to be gastrointestinal in etiology as they were associated with bloating and gas.  She was tender in her right upper quadrant, and an ultrasound was ordered.  This was negative for gallbladder or biliary pathology.  At this point, I became concerned that there was something more diffuse occurring such as colitis, bowel obstruction, diverticulitis.  Therefore, CT was ordered.  This was negative  for intra-abdominal pathology but did show a pleural effusion on the right.  I noticed that the patient's oxygen saturation was slightly low.  As documented, she does use oxygen at home.  Oxygen was applied per her request.  She is awaiting a chest x-ray for better evaluation of her pleural effusion.  I do think she will be able to return home.  However, she may require cardiology follow-up for further evaluation.  It does appear that several years ago she had a pleural effusion as well.  Dr. Sharlet will follow results of the x-ray and make a final disposition.  Problems Addressed: Chronic respiratory failure with hypoxia (*): chronic illness or injury Chronic systolic congestive heart failure (*): chronic illness or injury Generalized abdominal pain: acute illness or injury with systemic symptoms  Amount and/or Complexity of Data Reviewed External Data Reviewed: notes.    Details: Outpatient notes Labs: ordered. Decision-making details documented in ED Course. Radiology: ordered and independent interpretation performed. Decision-making details documented in ED Course.    Details: No bowel obstruction or perforation on CT scan ECG/medicine tests: ordered and independent interpretation performed. Decision-making details documented in ED Course.  Risk Prescription drug  management. Decision regarding hospitalization.           Provider Communication  Discharge Medication List as of 05/19/2024  9:12 PM     START taking these medications   Details  famotidine  (PEPCID ) 20 mg tablet Take one tablet (20 mg dose) by mouth 2 (two) times daily., Starting Thu 05/19/2024, Normal    ondansetron  (ZOFRAN -ODT) 8 mg disintegrating tablet Take one tablet (8 mg dose) by mouth every 8 (eight) hours as needed for Nausea., Starting Thu 05/19/2024, Normal    pantoprazole  sodium (PROTONIX ) 40 mg tablet Take one tablet (40 mg dose) by mouth daily., Starting Thu 05/19/2024, Normal        Discharge Medication List as of 05/19/2024  9:12 PM      Discharge Medication List as of 05/19/2024  9:12 PM      Clinical Impression Final diagnoses:  Generalized abdominal pain  Pleural effusion  Chronic respiratory failure with hypoxia (*)  Chronic systolic congestive heart failure (*)  Nausea  Blister (nonthermal), left lower leg, initial encounter    ED Disposition     ED Disposition  Discharge   Condition  Stable   Comment  --                 Follow-up Information     Camie JAYSON Mirza, PA. Schedule an appointment as soon as possible for a visit in 3 days.   Specialty: Family Medicine Comments: For follow up Contact information: [address] [place] [address] [phone]                  Electronically signed by:    Therisa KANDICE Silvan, MD 05/20/24 0720      [1] Social History Tobacco Use  Smoking Status Never  . Passive exposure: Past  Smokeless Tobacco Never  [2] Allergies Allergen Reactions  . Morphine Hypotension and Other    SEVERE HYPOTENSION SEVERE HYPOTENSION  . Avandia [Rosiglitazone] Swelling      (ANTIDIABETIC MED.)  . Cephalexin  Diarrhea  . Sulfa  Antibiotics Nausea And Vomiting    Diarrhea, nausea and vomiting.  . Tramadol Rash    Unknown reaction  . Tramadol-Acetaminophen  Rash  . Elavil   [Amitriptyline  Hcl] Nausea Only

## 2024-05-19 NOTE — ED Notes (Signed)
 Pt ambulated with 2L [place]. Pts baseline O2 was 97%, HR 55. While ambulating the lowest that the pts O2 dropped to was 94%, however remained mostly at 96%. Pts HR at its highest was 67. Pt states that she feels a little tired however is pretty much the same. Pt states that 2L [place] is her at home baseline.

## 2024-05-19 NOTE — ED Provider Notes (Signed)
 [facility]  ED Progress Note   Chest x-ray with small left pleural effusion.  She denies any shortness of breath that is acute.  Her last echocardiogram shows a EF of 20 to 25% and she is on chronic oxygen.  She does have slightly decreased breath sounds in the left lower lung.  Her abdominal exam is benign.  CT abdomen pelvis, right upper quadrant ultrasound showed no acute findings in her abdomen.  Stable for discharge with symptomatic treat with antacids, nausea medicine and follow-up with her primary care provider.  Incidentally, she also is a blister to her left lower extremity anteriorly.  Will provide mupirocin  ointment for that.  Ambulated well with her chronic oxygen.  Getting back on multiple prior x-rays over the past several years this effusion is chronic.   Electronically Signed by:   Norleen JONETTA Hopes, MD 05/19/24 2114

## 2024-05-23 ENCOUNTER — Telehealth: Payer: Self-pay | Admitting: Cardiology

## 2024-05-23 NOTE — Progress Notes (Signed)
 This patient's chart has been reviewed by a care coordinator in order to identify care needs and recommendations.  These recommendations are in agreement with best practice clinical guidelines for preventive care and/or chronic condition management.  Health Maintenance Due  Topic Date Due  . Mammogram  06/10/2024    Appointments which have been scheduled    Jun 02, 2024 3:00 PM Telemedicine Remote Visit with Ronal Birmingham, PharmD CPP [facility] [address] (--) [address] [place] [address] [phone]   Jun 08, 2024 11:00 AM Office Visit with Donnice FORBES Lipps, MD [facility] (--) [address] [place] [address] [phone]   Jun 14, 2024 10:00 AM (Arrive by 9:45 AM) MAMMO 3D TOMO SCREEN BIL with [facility] MAMMO RM 1 [facility] (Mammo) [facility] [place]) [address] [place] [address] [place] [address] [phone]   Jun 24, 2024 9:45 AM Office Visit with Merlynn Richardson Mt, NP [facility] (--) [address] [place] [address] [phone]   Jul 20, 2024 9:30 AM Annual Physical with Camie JAYSON Mirza, PA [facility] [place] (--) [address] [place] [address] [phone]     Attempted to contact patient in order to discuss appointments and screenings due for the upcoming year.   Left message with contact information for scheduling assistance if needed.

## 2024-05-23 NOTE — Telephone Encounter (Signed)
 Spoke with pt regarding preop clearance. Pt stated her procedure is on 7/9. Pt stated her last retina surgery did not require a preop clearance, but because she was recently released from the hospital, she wanted to make sure she didn't need clearance. Pt also stated she was prescribed Pepcid , which she thought was for fluid, but she was advise that medication treats heartburn, GERD, ulcers, etc. I called the [facility]. I left a message asking them to call the pt and also call our office for clarification.

## 2024-05-23 NOTE — Telephone Encounter (Signed)
 Yes, hold Eliquis  for 48hrs prior to procedure and restart when cleared by the operating physician.   Sara Ruben Linton, DO, FACC

## 2024-05-23 NOTE — Telephone Encounter (Signed)
 Patient called to advise that [facility] will be sending a clearance request for her to have retina surgery on 7/9.  Patient stated she was put on Famotidine , 20 mg in the ED for fluid in her lungs and wants to know if she will need to take this medication prior to surgery.

## 2024-05-23 NOTE — Telephone Encounter (Signed)
 Patient with diagnosis of afib on Eliquis  for anticoagulation.    Procedure: Vitrectomy of left eye  Date of procedure: 05/25/24   CHA2DS2-VASc Score = 7   This indicates a 11.2% annual risk of stroke. The patient's score is based upon: CHF History: 1 HTN History: 1 Diabetes History: 1 Stroke History: 0 Vascular Disease History: 1 Age Score: 2 Gender Score: 1      CrCl 42 ml/min Platelet count 116  Patient has not had an Afib/aflutter ablation within the last 3 months or DCCV within the last 30 days  Per office protocol, patient can hold Eliquis  for 2 days prior to procedure.    **This guidance is not considered finalized until pre-operative APP has relayed final recommendations.**

## 2024-05-23 NOTE — Telephone Encounter (Signed)
   Pre-operative Risk Assessment    Patient Name: Sara Graves  DOB: 1941/03/09 MRN: [id number]   Date of last office visit: 04/13/24 Date of next office visit: 05/31/24   Request for Surgical Clearance    Procedure:  Vitrectomy of left eye  Date of Surgery:  Clearance 05/25/24                                Surgeon:  Dr. Arley Ruder Surgeon's Group or Practice Name:  [facility]  Phone number:  [phone]  Fax number:  [phone]   Type of Clearance Requested:   - Medical  - Pharmacy:  Hold        Type of Anesthesia:  Local    Additional requests/questions:  Call stated patient will be having Local Mac anesthesia.  Signed, Jasmin B Wilson   05/23/2024, 3:31 PM

## 2024-05-24 NOTE — Telephone Encounter (Signed)
   Patient Name: Sara Graves  DOB: 07-27-1941 MRN: [id number]  Primary Cardiologist: Madonna Large, DO  Chart reviewed as part of pre-operative protocol coverage. Given past medical history and time since last visit, based on ACC/AHA guidelines, Sara Graves is at acceptable risk for the planned procedure without further cardiovascular testing.   Per Dr. Large, who saw the pt in clinic on 04/13/2024, Yes, hold Eliquis  for 48hrs prior to procedure and restart when cleared by the operating physician.    Sunit Melvin, DO, FACC.    I will route this recommendation to the requesting party via Epic fax function and remove from pre-op  pool.  Please call with questions.  Sara JAYSON Braver, NP 05/24/2024, 8:25 AM

## 2024-05-31 ENCOUNTER — Ambulatory Visit: Attending: Cardiology | Admitting: Cardiology

## 2024-05-31 ENCOUNTER — Encounter: Payer: Self-pay | Admitting: Cardiology

## 2024-05-31 VITALS — BP 134/68 | HR 60 | Resp 16 | Ht 62.0 in | Wt 191.7 lb

## 2024-05-31 DIAGNOSIS — Z95810 Presence of automatic (implantable) cardiac defibrillator: Secondary | ICD-10-CM | POA: Diagnosis not present

## 2024-05-31 DIAGNOSIS — I480 Paroxysmal atrial fibrillation: Secondary | ICD-10-CM

## 2024-05-31 DIAGNOSIS — I10 Essential (primary) hypertension: Secondary | ICD-10-CM

## 2024-05-31 DIAGNOSIS — E7800 Pure hypercholesterolemia, unspecified: Secondary | ICD-10-CM

## 2024-05-31 DIAGNOSIS — I2510 Atherosclerotic heart disease of native coronary artery without angina pectoris: Secondary | ICD-10-CM | POA: Diagnosis not present

## 2024-05-31 DIAGNOSIS — I5032 Chronic diastolic (congestive) heart failure: Secondary | ICD-10-CM

## 2024-05-31 MED ORDER — NITROGLYCERIN 0.4 MG SL SUBL: 0.4000 mg | SUBLINGUAL_TABLET | SUBLINGUAL | 0 refills | Status: AC | PRN

## 2024-05-31 NOTE — Patient Instructions (Signed)
 Medication Instructions:  Refill for nitroglycerin   *If you need a refill on your cardiac medications before your next appointment, please call your pharmacy*  Lab Work: None ordered today. If you have labs (blood work) drawn today and your tests are completely normal, you will receive your results only by: MyChart Message (if you have MyChart) OR A paper copy in the mail If you have any lab test that is abnormal or we need to change your treatment, we will call you to review the results.  Testing/Procedures: None ordered today.  Follow-Up: At [facility], you and your health needs are our priority.  As part of our continuing mission to provide you with exceptional heart care, our providers are all part of one team.  This team includes your primary Cardiologist (physician) and Advanced Practice Providers or APPs (Physician Assistants and Nurse Practitioners) who all work together to provide you with the care you need, when you need it.  Your next appointment:   6 month(s)  Provider:   Madonna Large, DO

## 2024-05-31 NOTE — Progress Notes (Signed)
 Cardiology Office Note:  .   Date:  05/31/2024  ID:  Inocente KATHEE Christ, DOB 21-Nov-1940, MRN [id number] PCP:  Jacques Camie Franchot [name]  Former Cardiology Providers: Dr. Gordy Bergamo Nephrologist: Dr. Manuelita Barters [facility] Providers Cardiologist:  Madonna Large, DO , [facility]  Electrophysiologist:  None  Click to update primary MD,subspecialty MD or APP then REFRESH:1}    Chief Complaint  Patient presents with   Coronary artery disease involving native coronary artery of   Follow-up    History of Present Illness: .   ZABRIA LISS is a 83 y.o. 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  Patient is being followed by the practice given her history of paroxysmal atrial fibrillation, coronary artery disease prior coronary interventions, heart failure with improved EF, and status post BiV ICD implant in August 2020.  Since last office visit patient denies any anginal chest pain or heart failure symptoms.  No hospitalizations or urgent care visits for cardiovascular reasons.  She has been compliant with her medical therapy.  No significant weight gain since the last visit.  Physical endurance remains stable. Home BP are well controlled. See her PCP in the coming month. Had a her eye surgery on 05/25/2024 without complications.    Review of Systems: .   Review of Systems  Cardiovascular:  Negative for chest pain, claudication, irregular heartbeat, leg swelling, near-syncope, orthopnea, palpitations, paroxysmal nocturnal dyspnea and syncope.  Respiratory:  Negative for shortness of breath.   Hematologic/Lymphatic: Negative for bleeding problem.    Studies Reviewed:      Echocardiogram: 02/12/2021: LVEF 20-25%, global hypokinesis, LV size dilated, right ventricular systolic function moderately reduced in size dilated, estimated RAP 68 mmHg, left severe LAE, moderate RAE, moderate to severe MR/TR see report for additional details  May 2023: LVEF 63%, mild AR, moderate MR, RVSP 34 mmHg.   Heart catheterization: Coronary angiogram 02/08/2019:  Distal left main 20% diffuse disease.  Ostial LAD stent shows diffuse 20% in-stent restenosis (3.0 x 15 mm resolute Onyx on 12/29/2017).  Mid LAD and mid to distal LAD there are tandem lesions 80% and 95%.  Ramus intermediate ostial 95 to 99% stenosis.  Previously placed RI stent widely patent ( 2.0 x 12 mm resolute on 04/26/2018).  30 to 40% tandem lesions in the RCA. Cutting Balloon angioplasty of the ramus intermediate ostial high-grade stenosis, 99% reduced to 0% and Cutting Balloon angioplasty of the mid LAD followed by stenting with 2.5 x 32 mm Synergy DES for high-grade 90% stenosis reduced to 0% and TIMI-3 to TIMI-3 flow maintained in both lesions.   Carotid duplex December 2024: Right Carotid: Velocities in the right ICA are consistent with a 1-39% stenosis.   Left Carotid: Velocities in the left ICA are consistent with a 1-39% stenosis.   Vertebrals:  Bilateral vertebral arteries demonstrate antegrade flow. Subclavians: Right subclavian artery flow was disturbed. Normal flow hemodynamics were seen in the left subclavian artery. No             significant BP differential between arms.   Remote CRT-D transmission 01/2024 Scheduled remote reviewed. Normal device function.  Presenting rhythm:  AS/BiVP  30 AMS, AF burden is 1%, Eliquis  per EMR, longest 2 hrs 18 min   04/2024 Remote Defibrillator interrogation  reviewed. Presenting Rhythm:A-sensed V-paced. Battery and lead parameters stable with stable capture and sensing. Device programming is appropriate. No arrhythmias noted. Continue remote monitoring.    RADIOLOGY: N/A  Risk Assessment/Calculations:   Click Here to Calculate/Change CHADS2VASc Score The patient's CHADS2-VASc score is 7, indicating a 11.2% annual risk of stroke.   CHF History: Yes HTN History: Yes Diabetes History: Yes Stroke History: No Vascular Disease History: Yes  Labs:       Latest Ref Rng & Units 10/06/2023    1:09 PM 06/07/2021   11:53 AM 02/12/2021    3:19 AM  CBC  WBC 4.0 - 10.5 K/uL  5.0  4.1   Hemoglobin 11.1 - 15.9 g/dL 88.2  87.5  88.0   Hematocrit 34.0 - 46.6 % 36.6  36.6  36.5   Platelets 150 - 400 K/uL  102  92        Latest Ref Rng & Units 01/05/2024    1:17 PM 11/09/2023    7:57 AM 10/06/2023    1:09 PM  BMP  Glucose 70 - 99 mg/dL 876  91  898   BUN 8 - 27 mg/dL 21  22  15    Creatinine 0.57 - 1.00 mg/dL 8.88  8.79  8.95   BUN/Creat Ratio 12 - 28 19  18  14    Sodium 134 - 144 mmol/L 145  144  146   Potassium 3.5 - 5.2 mmol/L 4.4  4.6  3.2   Chloride 96 - 106 mmol/L 105  104  101   CO2 20 - 29 mmol/L 24  26  28    Calcium  8.7 - 10.3 mg/dL 9.5  9.1  8.6       Latest Ref Rng & Units 01/05/2024    1:17 PM 11/09/2023    7:57 AM 10/06/2023    1:09 PM  CMP  Glucose 70 - 99 mg/dL 876  91  898   BUN 8 - 27 mg/dL 21  22  15    Creatinine 0.57 - 1.00 mg/dL 8.88  8.79  8.95   Sodium 134 - 144 mmol/L 145  144  146   Potassium 3.5 - 5.2 mmol/L 4.4  4.6  3.2   Chloride 96 - 106 mmol/L 105  104  101   CO2 20 - 29 mmol/L 24  26  28    Calcium  8.7 - 10.3 mg/dL 9.5  9.1  8.6           Physical Exam:    Today's Vitals   05/31/24 0926  BP: 134/68  Pulse: 60  Resp: 16  SpO2: 94%  Weight: 191 lb 11 oz (87 kg)  Height: 5' 2 (1.575 m)   Body mass index is 35.06 kg/m. Wt Readings from Last 3 Encounters:  05/31/24 191 lb 11 oz (87 kg)  04/13/24 192 lb 3.2 oz (87.2 kg)  01/05/24 193 lb 12.8 oz (87.9 kg)    Physical Exam  Constitutional: No distress. She appears chronically ill.  hemodynamically stable, appears older than stated  age,  Neck: No JVD present.  Cardiovascular: Normal rate, regular rhythm, S1 normal and S2 normal. Exam reveals no gallop, no S3 and no S4.  Murmur heard. Holosystolic murmur is present with a grade of 3/6 at the apex. Pulses:      Carotid pulses are  on the right side with bruit and  on the left side with bruit. Pulmonary/Chest: Effort normal and breath sounds normal. No stridor. She has no wheezes. She has no  rales.  Device site is clean dry and intact.  Abdominal: Soft. Bowel sounds are normal. She exhibits no distension. There is no abdominal tenderness.  Musculoskeletal:        General: Edema (Trace bilateral, left shin bilster w/ fluid collection, no drainage / redness) present.     Cervical back: Neck supple.  Neurological: She is alert and oriented to person, place, and time. She has intact cranial nerves (2-12).  Skin: Skin is warm.    Impression & Recommendation(s):  Impression:   ICD-10-CM   1. Coronary artery disease involving native coronary artery of native heart without angina pectoris  I25.10 nitroGLYCERIN  (NITROSTAT ) 0.4 MG SL tablet    2. Heart failure with improved ejection fraction (HFimpEF) (HCC)  I50.32     3. ICD Medtronic Claria MRI CRTD 06/21/2019  Z95.810     4. Paroxysmal atrial fibrillation (HCC)  I48.0     5. Essential hypertension  I10     6. Pure hypercholesterolemia  E78.00        Recommendation(s):  Coronary artery disease involving native coronary artery of native heart without angina pectoris Heart failure with improved ejection fraction (HFimpEF) (HCC) Stage C, NYHA class II No hospitalizations for congestive heart failure since last office visit. Continue Demadex  20 mg p.o. as needed daily-with weight gain more than 1 pound every 24 hours or 3 pounds over a week. Continue spironolactone  25 mg p.o. daily. Continue Entresto  97/103 mg p.o. twice daily. Continue Toprol -XL 50 mg p.o. daily. Continue Imdur  120 mg p.o. daily. Discontinued  Jardiance in the past secondary to urinary tract infections.  ICD Medtronic Claria MRI CRTD 06/21/2019 Recently had her device checked June 2025-report reviewed and summarized above  Paroxysmal atrial fibrillation (HCC) Rate control: Metoprolol . Rhythm control: N/A. Thromboembolic prophylaxis: Eliquis  Patient does not endorse evidence of bleeding. Risks, benefits, alternatives to oral anticoagulation discussed Device check in March 2025 noted approximately 30 mode switches and A-fib burden of approximately 1% longest being 2 hours and 18 minutes.  Most recent pacemaker check did not illustrate any significant PAF episodes   Coronary artery disease involving native coronary artery of native heart without angina pectoris Denies anginal chest pain. No use of sublingual nitroglycerin  tablets. Nitroglycerin  tablets refilled. Reemphasized the importance of secondary prevention with focus on improving her modifiable cardiovascular risk factors such as glycemic control, lipid management, blood pressure control, weight loss.  Essential hypertension Office and home blood pressures are well-controlled. Medications as discussed above. Reemphasized importance of low-salt diet.  Pure hypercholesterolemia Currently on Repatha , Zetia  10 mg p.o. daily, and Crestor  40 mg p.o. nightly. Outside labs independently reviewed which illustrates an LDL of 114 mg/dL. Will have lipids rechecked with PCP in the coming month, will send us  a copy for reference.  Discussed management of at least 2 chronic comorbid conditions. Reviewed most recent echocardiogram results. Prescription drug management. Medication refills. Most recent device check from June 2025 reviewed. Coordination of care.   Orders Placed:  No orders of the defined types were placed in this encounter.   Final Medication List:    Meds ordered this encounter  Medications   nitroGLYCERIN  (NITROSTAT ) 0.4 MG SL tablet    Sig: Place 1 tablet  (0.4 mg total) under the tongue every 5 (five) minutes as needed for chest pain.    Dispense:  30 tablet    Refill:  0    Medications Discontinued During This Encounter  Medication Reason   nitroGLYCERIN  (NITROSTAT ) 0.4 MG SL tablet Reorder  Current Outpatient Medications:    apixaban  (ELIQUIS ) 5 MG TABS tablet, Take 1 tablet (5 mg total) by mouth 2 (two) times daily., Disp: 60 tablet, Rfl: 3   BD PEN NEEDLE NANO 2ND GEN 32G X 4 MM MISC, , Disp: , Rfl:    Calcium  Carb-Cholecalciferol (CALCIUM  600 + D PO), Take by mouth., Disp: , Rfl:    Continuous Glucose Sensor (FREESTYLE LIBRE 3 SENSOR) MISC, Place onto the skin., Disp: , Rfl:    ezetimibe  (ZETIA ) 10 MG tablet, Take 10 mg by mouth daily., Disp: , Rfl:    FLUoxetine  (PROZAC ) 10 MG capsule, Take 10 mg by mouth daily., Disp: , Rfl:    glucose blood test strip, 1 each 3 (three) times daily. , Disp: , Rfl:    insulin  aspart (NOVOLOG  FLEXPEN) 100 UNIT/ML FlexPen, Inject into the skin 3 (three) times daily with meals. 2-4 units, Disp: , Rfl:    insulin  degludec (TRESIBA) 100 UNIT/ML FlexTouch Pen, Inject into the skin daily. 4-5 Units, Disp: , Rfl:    isosorbide  mononitrate (IMDUR ) 120 MG 24 hr tablet, Take 1 tablet (120 mg total) by mouth daily., Disp: 30 tablet, Rfl: 3   levothyroxine  (SYNTHROID , LEVOTHROID) 50 MCG tablet, Take 50 mcg by mouth daily before breakfast. , Disp: , Rfl:    methocarbamol  (ROBAXIN ) 500 MG tablet, Take 500 mg by mouth 4 (four) times daily., Disp: , Rfl:    metoprolol  succinate (TOPROL -XL) 50 MG 24 hr tablet, Take 1 tablet (50 mg total) by mouth daily. Take with or immediately following a meal., Disp: 90 tablet, Rfl: 3   ONETOUCH VERIO test strip, 1 each by Other route 3 (three) times daily., Disp: , Rfl: 5   potassium chloride  SA (KLOR-CON  M20) 20 MEQ tablet, Take 2 tablets (40 mEq total) by mouth daily., Disp: 90 tablet, Rfl: 3   prednisoLONE acetate (PRED FORTE) 1 % ophthalmic suspension, Apply to eye., Disp:  , Rfl:    REPATHA  SURECLICK 140 MG/ML SOAJ, Inject into the skin., Disp: , Rfl:    rosuvastatin  (CRESTOR ) 40 MG tablet, Take 1 tablet (40 mg total) by mouth at bedtime., Disp: 90 tablet, Rfl: 3   sacubitril -valsartan  (ENTRESTO ) 97-103 MG, Take 1 tablet by mouth 2 (two) times daily., Disp: , Rfl:    spironolactone  (ALDACTONE ) 25 MG tablet, Take 1 tablet (25 mg total) by mouth daily., Disp: 90 tablet, Rfl: 3   torsemide  (DEMADEX ) 20 MG tablet, Take 1 tablet (20 mg total) by mouth as needed., Disp: 90 tablet, Rfl: 3   vitamin B-12 (CYANOCOBALAMIN ) 1000 MCG tablet, Take 1,000 mcg by mouth daily. , Disp: , Rfl:    Vitamin D , Ergocalciferol , (DRISDOL ) 50000 units CAPS capsule, Take 50,000 Units by mouth every 7 (seven) days. Take on Thursdays, Disp: , Rfl:    alendronate  (FOSAMAX ) 70 MG tablet, Take 1 tablet (70 mg total) by mouth once a week. (Patient not taking: Reported on 05/31/2024), Disp: 4 tablet, Rfl: 1   busPIRone  (BUSPAR ) 5 MG tablet, Take 5 mg by mouth 2 (two) times daily. As needed (Patient not taking: Reported on 05/31/2024), Disp: , Rfl:    denosumab (XGEVA) 120 MG/1.7ML SOLN injection, Inject 120 mg into the skin. (Patient not taking: Reported on 05/31/2024), Disp: , Rfl:    gabapentin  (NEURONTIN ) 100 MG capsule, Take 100 mg by mouth at bedtime. (Patient not taking: Reported on 05/31/2024), Disp: , Rfl:    insulin  glargine (LANTUS  SOLOSTAR) 100 UNIT/ML Solostar Pen, Inject into the skin., Disp: , Rfl:  Magnesium  Oxide 500 MG TABS, TAKE 1 TABLET (500 MG TOTAL) BY MOUTH IN THE MORNING AND AT BEDTIME. (Patient not taking: Reported on 05/31/2024), Disp: 180 tablet, Rfl: 1   nitroGLYCERIN  (NITROSTAT ) 0.4 MG SL tablet, Place 1 tablet (0.4 mg total) under the tongue every 5 (five) minutes as needed for chest pain., Disp: 30 tablet, Rfl: 0  Consent:   N/A  Disposition:   10-month follow-up sooner if needed  Her questions and concerns were addressed to her satisfaction. She voices understanding of  the recommendations provided during this encounter.    Signed, Madonna Michele HAS, [facility]  A Division of [facility] [address] [place], [place] [address]  [place], [place] [address] 05/31/2024 11:16 AM

## 2024-06-22 NOTE — Progress Notes (Signed)
 Remote ICD transmission.

## 2024-06-22 NOTE — Addendum Note (Signed)
 Addended by: VICCI SELLER A on: 06/22/2024 08:50 AM   Modules accepted: Orders

## 2024-07-16 ENCOUNTER — Other Ambulatory Visit: Payer: Self-pay | Admitting: Cardiology

## 2024-07-16 DIAGNOSIS — I2510 Atherosclerotic heart disease of native coronary artery without angina pectoris: Secondary | ICD-10-CM

## 2024-07-16 DIAGNOSIS — I5032 Chronic diastolic (congestive) heart failure: Secondary | ICD-10-CM

## 2024-07-28 ENCOUNTER — Ambulatory Visit: Payer: Medicare PPO

## 2024-07-28 DIAGNOSIS — I480 Paroxysmal atrial fibrillation: Secondary | ICD-10-CM | POA: Diagnosis not present

## 2024-07-28 LAB — CUP PACEART REMOTE DEVICE CHECK
Battery Remaining Longevity: 48 mo
Battery Voltage: 2.97 V
Brady Statistic AP VP Percent: 11.69 %
Brady Statistic AP VS Percent: 0.33 %
Brady Statistic AS VP Percent: 86.16 %
Brady Statistic AS VS Percent: 1.82 %
Brady Statistic RA Percent Paced: 11.99 %
Brady Statistic RV Percent Paced: 1.44 %
Date Time Interrogation Session: 20250911031606
HighPow Impedance: 68 Ohm
Implantable Lead Connection Status: 753985
Implantable Lead Connection Status: 753985
Implantable Lead Connection Status: 753985
Implantable Lead Implant Date: 20200804
Implantable Lead Implant Date: 20200804
Implantable Lead Implant Date: 20200804
Implantable Lead Location: 753858
Implantable Lead Location: 753859
Implantable Lead Location: 753860
Implantable Lead Model: 4298
Implantable Lead Model: 5076
Implantable Lead Model: 6935
Implantable Pulse Generator Implant Date: 20200804
Lead Channel Impedance Value: 132.321
Lead Channel Impedance Value: 132.321
Lead Channel Impedance Value: 132.321
Lead Channel Impedance Value: 142.5 Ohm
Lead Channel Impedance Value: 142.5 Ohm
Lead Channel Impedance Value: 247 Ohm
Lead Channel Impedance Value: 285 Ohm
Lead Channel Impedance Value: 285 Ohm
Lead Channel Impedance Value: 285 Ohm
Lead Channel Impedance Value: 361 Ohm
Lead Channel Impedance Value: 361 Ohm
Lead Channel Impedance Value: 361 Ohm
Lead Channel Impedance Value: 418 Ohm
Lead Channel Impedance Value: 456 Ohm
Lead Channel Impedance Value: 456 Ohm
Lead Channel Impedance Value: 456 Ohm
Lead Channel Impedance Value: 475 Ohm
Lead Channel Impedance Value: 475 Ohm
Lead Channel Pacing Threshold Amplitude: 0.5 V
Lead Channel Pacing Threshold Amplitude: 0.75 V
Lead Channel Pacing Threshold Amplitude: 1.125 V
Lead Channel Pacing Threshold Pulse Width: 0.4 ms
Lead Channel Pacing Threshold Pulse Width: 0.4 ms
Lead Channel Pacing Threshold Pulse Width: 0.4 ms
Lead Channel Sensing Intrinsic Amplitude: 1.5 mV
Lead Channel Sensing Intrinsic Amplitude: 1.5 mV
Lead Channel Sensing Intrinsic Amplitude: 31.625 mV
Lead Channel Sensing Intrinsic Amplitude: 31.625 mV
Lead Channel Setting Pacing Amplitude: 1.5 V
Lead Channel Setting Pacing Amplitude: 2 V
Lead Channel Setting Pacing Amplitude: 2.5 V
Lead Channel Setting Pacing Pulse Width: 0.4 ms
Lead Channel Setting Pacing Pulse Width: 0.4 ms
Lead Channel Setting Sensing Sensitivity: 0.3 mV
Zone Setting Status: 755011
Zone Setting Status: 755011

## 2024-08-04 NOTE — Progress Notes (Signed)
Remote ICD Transmission.

## 2024-08-05 ENCOUNTER — Ambulatory Visit: Payer: Self-pay | Admitting: Internal Medicine

## 2024-10-27 ENCOUNTER — Ambulatory Visit: Payer: Medicare PPO

## 2024-10-27 DIAGNOSIS — I480 Paroxysmal atrial fibrillation: Secondary | ICD-10-CM | POA: Diagnosis not present

## 2024-10-28 LAB — CUP PACEART REMOTE DEVICE CHECK
Battery Remaining Longevity: 42 mo
Battery Voltage: 2.97 V
Brady Statistic AP VP Percent: 3.84 %
Brady Statistic AP VS Percent: 0.11 %
Brady Statistic AS VP Percent: 93.92 %
Brady Statistic AS VS Percent: 2.13 %
Brady Statistic RA Percent Paced: 3.93 %
Brady Statistic RV Percent Paced: 1.86 %
Date Time Interrogation Session: 20251211031705
HighPow Impedance: 63 Ohm
Implantable Lead Connection Status: 753985
Implantable Lead Connection Status: 753985
Implantable Lead Connection Status: 753985
Implantable Lead Implant Date: 20200804
Implantable Lead Implant Date: 20200804
Implantable Lead Implant Date: 20200804
Implantable Lead Location: 753858
Implantable Lead Location: 753859
Implantable Lead Location: 753860
Implantable Lead Model: 4298
Implantable Lead Model: 5076
Implantable Lead Model: 6935
Implantable Pulse Generator Implant Date: 20200804
Lead Channel Impedance Value: 132.321
Lead Channel Impedance Value: 132.321
Lead Channel Impedance Value: 132.321
Lead Channel Impedance Value: 142.5 Ohm
Lead Channel Impedance Value: 142.5 Ohm
Lead Channel Impedance Value: 247 Ohm
Lead Channel Impedance Value: 285 Ohm
Lead Channel Impedance Value: 285 Ohm
Lead Channel Impedance Value: 285 Ohm
Lead Channel Impedance Value: 342 Ohm
Lead Channel Impedance Value: 361 Ohm
Lead Channel Impedance Value: 361 Ohm
Lead Channel Impedance Value: 456 Ohm
Lead Channel Impedance Value: 456 Ohm
Lead Channel Impedance Value: 456 Ohm
Lead Channel Impedance Value: 456 Ohm
Lead Channel Impedance Value: 475 Ohm
Lead Channel Impedance Value: 475 Ohm
Lead Channel Pacing Threshold Amplitude: 0.625 V
Lead Channel Pacing Threshold Amplitude: 0.75 V
Lead Channel Pacing Threshold Amplitude: 1 V
Lead Channel Pacing Threshold Pulse Width: 0.4 ms
Lead Channel Pacing Threshold Pulse Width: 0.4 ms
Lead Channel Pacing Threshold Pulse Width: 0.4 ms
Lead Channel Sensing Intrinsic Amplitude: 1.625 mV
Lead Channel Sensing Intrinsic Amplitude: 1.625 mV
Lead Channel Sensing Intrinsic Amplitude: 31.625 mV
Lead Channel Sensing Intrinsic Amplitude: 31.625 mV
Lead Channel Setting Pacing Amplitude: 1.5 V
Lead Channel Setting Pacing Amplitude: 2 V
Lead Channel Setting Pacing Amplitude: 2.5 V
Lead Channel Setting Pacing Pulse Width: 0.4 ms
Lead Channel Setting Pacing Pulse Width: 0.4 ms
Lead Channel Setting Sensing Sensitivity: 0.3 mV
Zone Setting Status: 755011
Zone Setting Status: 755011

## 2024-10-30 ENCOUNTER — Ambulatory Visit: Payer: Self-pay | Admitting: Internal Medicine

## 2024-11-03 NOTE — Progress Notes (Signed)
 Remote ICD Transmission

## 2024-12-02 ENCOUNTER — Other Ambulatory Visit (HOSPITAL_COMMUNITY): Payer: Self-pay

## 2024-12-02 ENCOUNTER — Ambulatory Visit: Attending: Cardiology | Admitting: Cardiology

## 2024-12-02 ENCOUNTER — Encounter: Payer: Self-pay | Admitting: Cardiology

## 2024-12-02 VITALS — BP 173/81 | HR 71 | Resp 16 | Ht 62.0 in | Wt 196.0 lb

## 2024-12-02 DIAGNOSIS — I2510 Atherosclerotic heart disease of native coronary artery without angina pectoris: Secondary | ICD-10-CM | POA: Diagnosis not present

## 2024-12-02 DIAGNOSIS — I5020 Unspecified systolic (congestive) heart failure: Secondary | ICD-10-CM

## 2024-12-02 DIAGNOSIS — Z95810 Presence of automatic (implantable) cardiac defibrillator: Secondary | ICD-10-CM | POA: Diagnosis not present

## 2024-12-02 DIAGNOSIS — I480 Paroxysmal atrial fibrillation: Secondary | ICD-10-CM

## 2024-12-02 DIAGNOSIS — I10 Essential (primary) hypertension: Secondary | ICD-10-CM | POA: Diagnosis not present

## 2024-12-02 DIAGNOSIS — E7800 Pure hypercholesterolemia, unspecified: Secondary | ICD-10-CM | POA: Diagnosis not present

## 2024-12-02 MED ORDER — ISOSORB DINITRATE-HYDRALAZINE 20-37.5 MG PO TABS
1.0000 | ORAL_TABLET | Freq: Two times a day (BID) | ORAL | 3 refills | Status: DC
  Filled 2024-12-02: qty 180, 90d supply, fill #0

## 2024-12-02 MED ORDER — ISOSORB DINITRATE-HYDRALAZINE 20-37.5 MG PO TABS: 1.0000 | ORAL_TABLET | Freq: Two times a day (BID) | ORAL | 3 refills | Status: DC

## 2024-12-02 NOTE — Progress Notes (Signed)
 " Cardiology Office Note:  .   Date:  12/02/2024  ID:  Sara Graves, DOB Apr 11, 1941, MRN [id number] PCP:  Jacques Camie Franchot [name]  Former Cardiology Providers: Dr. Gordy Bergamo Nephrologist: Dr. Manuelita Barters  HeartCare Providers Cardiologist:  Madonna Large, DO , [facility]  Electrophysiologist:  None  Click to update primary MD,subspecialty MD or APP then REFRESH:1}    Chief Complaint  Patient presents with   Coronary artery disease involving native coronary artery of   Follow-up    History of Present Illness: .   Sara Graves is a 84 y.o. 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  Patient is being followed by the practice given her history of paroxysmal atrial fibrillation, coronary artery disease prior coronary interventions, heart failure with improved EF, and status post BiV ICD implant in August 2020.  Since last office visit Sara Graves denies any anginal chest pain or heart failure symptoms.   No hospitalizations or urgent care visits for cardiovascular reasons.   She has been compliant with her medical therapy and endorses no concerns.  Relative stable weight - up 5# since 05/2024 Physical endurance remains stable.  Home SBP mostly between 140-124mmHg.  Since last office visit patient has had COVID infection, flu, and hospitalizations for noncardiac reasons per patient  Review of Systems: .   Review of Systems  Cardiovascular:  Negative for chest pain, claudication, irregular heartbeat, leg swelling, near-syncope, orthopnea, palpitations, paroxysmal nocturnal dyspnea and syncope.  Respiratory:  Negative for shortness of breath.   Hematologic/Lymphatic: Negative for bleeding  problem.    Studies Reviewed:   EKG Interpretation Date/Time:  Friday December 02 2024 11:45:27 EST Ventricular Rate:  73 PR Interval:  152 QRS Duration:  154 QT Interval:  460 QTC Calculation: 506 R Axis:   39  Text Interpretation: Atrial-sensed ventricular-paced rhythm When compared with ECG of 13-Apr-2024 10:13, Vent. rate has increased BY  18 BPM Confirmed by Large Madonna [phone]) on 12/02/2024 11:56:49 AM  Echocardiogram: 02/12/2021: LVEF 20-25%, global hypokinesis, LV size dilated, right ventricular systolic function moderately reduced in size dilated, estimated RAP 68 mmHg, left severe LAE, moderate RAE, moderate to severe MR/TR see report for additional details  May 2023: LVEF 63%, mild AR, moderate MR, RVSP 34 mmHg.   Heart catheterization: Coronary angiogram 02/08/2019:  Distal left main 20% diffuse disease.  Ostial LAD stent shows diffuse 20% in-stent restenosis (3.0 x 15 mm resolute Onyx on 12/29/2017).  Mid LAD and mid to distal LAD there are tandem lesions 80% and 95%.  Ramus intermediate ostial 95 to 99% stenosis.  Previously placed RI stent widely patent ( 2.0 x 12 mm resolute on 04/26/2018).  30 to 40% tandem lesions in the RCA. Cutting Balloon angioplasty of the ramus intermediate ostial high-grade stenosis, 99% reduced to 0% and Cutting Balloon angioplasty of the mid LAD followed by stenting with 2.5 x 32 mm Synergy DES for high-grade 90% stenosis reduced to 0% and TIMI-3 to TIMI-3 flow maintained in both lesions.   Carotid duplex December 2024: Right Carotid: Velocities in the right ICA are consistent with a 1-39% stenosis.   Left Carotid: Velocities in the left ICA are consistent with a 1-39% stenosis.   Vertebrals:  Bilateral vertebral arteries demonstrate antegrade flow. Subclavians: Right subclavian  artery flow was disturbed. Normal flow hemodynamics were seen in the left subclavian artery. No             significant BP differential between arms.   Remote CRT-D  transmission 01/2024 Scheduled remote reviewed. Normal device function.  Presenting rhythm:  AS/BiVP  30 AMS, AF burden is 1%, Eliquis  per EMR, longest 2 hrs 18 min   04/2024 Remote Defibrillator interrogation reviewed. Presenting Rhythm:A-sensed V-paced. Battery and lead parameters stable with stable capture and sensing. Device programming is appropriate. No arrhythmias noted. Continue remote monitoring.   RADIOLOGY: N/A  Risk Assessment/Calculations:   Click Here to Calculate/Change CHADS2VASc Score The patient's CHADS2-VASc score is 7, indicating a 11.2% annual risk of stroke.   CHF History: Yes HTN History: Yes Diabetes History: Yes Stroke History: No Vascular Disease History: Yes  Labs:       Latest Ref Rng & Units 10/06/2023    1:09 PM 06/07/2021   11:53 AM 02/12/2021    3:19 AM  CBC  WBC 4.0 - 10.5 K/uL  5.0  4.1   Hemoglobin 11.1 - 15.9 g/dL 88.2  87.5  88.0   Hematocrit 34.0 - 46.6 % 36.6  36.6  36.5   Platelets 150 - 400 K/uL  102  92        Latest Ref Rng & Units 01/05/2024    1:17 PM 11/09/2023    7:57 AM 10/06/2023    1:09 PM  BMP  Glucose 70 - 99 mg/dL 876  91  898   BUN 8 - 27 mg/dL 21  22  15    Creatinine 0.57 - 1.00 mg/dL 8.88  8.79  8.95   BUN/Creat Ratio 12 - 28 19  18  14    Sodium 134 - 144 mmol/L 145  144  146   Potassium 3.5 - 5.2 mmol/L 4.4  4.6  3.2   Chloride 96 - 106 mmol/L 105  104  101   CO2 20 - 29 mmol/L 24  26  28    Calcium  8.7 - 10.3 mg/dL 9.5  9.1  8.6       Latest Ref Rng & Units 01/05/2024    1:17 PM 11/09/2023    7:57 AM 10/06/2023    1:09 PM  CMP  Glucose 70 - 99 mg/dL 876  91  898   BUN 8 - 27 mg/dL 21  22  15    Creatinine 0.57 - 1.00 mg/dL 8.88  8.79  8.95   Sodium 134 - 144 mmol/L 145  144  146   Potassium 3.5 - 5.2 mmol/L 4.4  4.6  3.2   Chloride 96 - 106 mmol/L 105  104  101   CO2 20 - 29 mmol/L 24  26  28    Calcium  8.7 - 10.3 mg/dL 9.5  9.1  8.6          Physical Exam:    Today's Vitals   12/02/24 1150  12/02/24 1151  BP: (!) 178/90 (!) 173/81  Pulse: 71   Resp: 16   SpO2: 94%   Weight: 196 lb (88.9 kg)   Height: 5' 2 (1.575 m)    Body mass index is 35.85 kg/m. Wt Readings from Last 3 Encounters:  12/02/24 196 lb (88.9 kg)  05/31/24 191 lb 11 oz (87 kg)  04/13/24 192 lb 3.2 oz (87.2 kg)    Physical Exam  Constitutional: No distress. She appears chronically ill.  hemodynamically stable, appears older than stated age,  Neck: No JVD present.  Cardiovascular: Normal rate, regular rhythm, S1 normal and S2 normal. Exam reveals no gallop, no S3 and no S4.  Murmur heard. Holosystolic murmur is present with a grade of 3/6 at the apex. Pulses:      Carotid pulses are  on the right side with bruit and  on the left side with bruit. Pulmonary/Chest: Effort normal and breath sounds normal. No stridor. She has no wheezes. She has no rales.  Device site is clean dry and intact.  Abdominal: Soft. Bowel sounds are normal. She exhibits no distension. There is no abdominal tenderness.  Musculoskeletal:        General: Edema (Trace bilateral) present.     Cervical back: Neck supple.  Neurological: She is alert and oriented to person, place, and time. She has intact cranial nerves (2-12).  Skin: Skin is warm.    Impression & Recommendation(s):  Impression:   ICD-10-CM   1. Coronary artery disease involving native coronary artery of native heart without angina pectoris  I25.10 EKG 12-Lead    2. Heart failure with improved ejection fraction (HFimpEF) (HCC)  I50.20     3. ICD Medtronic Claria MRI CRTD 06/21/2019  Z95.810     4. Paroxysmal atrial fibrillation (HCC)  I48.0     5. Essential hypertension  I10     6. Pure hypercholesterolemia  E78.00        Recommendation(s):  Coronary artery disease involving native coronary artery of native heart without angina pectoris Denies anginal chest pain. No use of sublingual nitroglycerin  tablets. EKG nonischemic Not on antiplatelets as she is  currently on Eliquis  for A-fib Currently on Repatha , Crestor  and Zetia  for lipid management Reemphasized the importance of secondary prevention with focus on improving her modifiable cardiovascular risk factors such as glycemic control, lipid management, blood pressure control, weight loss.  Heart failure with improved ejection fraction (HFimpEF) (HCC) Stage C, NYHA class II No hospitalizations for congestive heart failure since last office visit. Continue Demadex  20 mg p.o. as needed daily-with weight gain more than 1 pound every 24 hours or 3 pounds over a week. Continue spironolactone  25 mg p.o. daily. Continue Entresto  97/103 mg p.o. twice daily. Continue Toprol -XL 50 mg p.o. daily. Discontinue isosorbide  dinitrate. Start BiDil  20/37.5mg  1 tablet 3 times daily Discontinued Jardiance in the past secondary to urinary tract infections.  ICD Medtronic Claria MRI CRTD 06/21/2019 Recently had her device checked December 2025-a sensed V paced, battery stable, PAF longest duration 5 hours 21 minutes  Paroxysmal atrial fibrillation (HCC) Rate control: Metoprolol . Rhythm control: N/A. Thromboembolic prophylaxis: Eliquis  Patient does not endorse evidence of bleeding. Risks, benefits, alternatives to oral anticoagulation discussed Continues to have episodes of PAF on device interrogation.  Essential hypertension Office and home blood pressures are not at goal Discontinue isosorbide  dinitrate Start BiDil  Reemphasized importance of low-salt diet. See above  Pure hypercholesterolemia Currently on Repatha , Zetia  10 mg p.o. daily, and Crestor  40 mg p.o. nightly. Outside labs independently reviewed which illustrates an LDL 56 mg/dL, at goal See labs above  Patient's pulse ox is 94%.  She brings in paperwork from [facility] requesting renewing her oxygen at home.  Have advised her to discuss this further with PCP and/or if needed consider pulmonary evaluation.  She verbalized  understanding.  Orders Placed:  Orders Placed This Encounter  Procedures   EKG 12-Lead    Final Medication List:    Meds ordered this encounter  Medications   DISCONTD: isosorbide -hydrALAZINE  (BIDIL ) 20-37.5 MG tablet    Sig: Take  1 tablet by mouth in the morning and at bedtime.    Dispense:  180 tablet    Refill:  3   isosorbide -hydrALAZINE  (BIDIL ) 20-37.5 MG tablet    Sig: Take 1 tablet by mouth in the morning and at bedtime.    Dispense:  180 tablet    Refill:  3      Current Outpatient Medications:    apixaban  (ELIQUIS ) 5 MG TABS tablet, Take 1 tablet (5 mg total) by mouth 2 (two) times daily., Disp: 60 tablet, Rfl: 3   BD PEN NEEDLE NANO 2ND GEN 32G X 4 MM MISC, , Disp: , Rfl:    Calcium  Carb-Cholecalciferol (CALCIUM  600 + D PO), Take by mouth., Disp: , Rfl:    clotrimazole (LOTRIMIN) 1 % cream, Apply 1 Application topically 2 (two) times daily., Disp: , Rfl:    denosumab (XGEVA) 120 MG/1.7ML SOLN injection, Inject 120 mg into the skin., Disp: , Rfl:    ezetimibe  (ZETIA ) 10 MG tablet, Take 10 mg by mouth daily., Disp: , Rfl:    FLUoxetine  (PROZAC ) 10 MG capsule, Take 10 mg by mouth daily., Disp: , Rfl:    glucose blood test strip, 1 each 3 (three) times daily. , Disp: , Rfl:    insulin  lispro (HUMALOG) 100 UNIT/ML KwikPen, Inject 2 Units into the skin., Disp: , Rfl:    levothyroxine  (SYNTHROID , LEVOTHROID) 50 MCG tablet, Take 50 mcg by mouth daily before breakfast. , Disp: , Rfl:    Magnesium  Oxide 500 MG TABS, TAKE 1 TABLET (500 MG TOTAL) BY MOUTH IN THE MORNING AND AT BEDTIME., Disp: 180 tablet, Rfl: 1   methocarbamol  (ROBAXIN ) 500 MG tablet, Take 500 mg by mouth 4 (four) times daily., Disp: , Rfl:    metoprolol  succinate (TOPROL -XL) 50 MG 24 hr tablet, Take 1 tablet (50 mg total) by mouth daily. Take with or immediately following a meal., Disp: 90 tablet, Rfl: 3   nitroGLYCERIN  (NITROSTAT ) 0.4 MG SL tablet, Place 1 tablet (0.4 mg total) under the tongue every 5 (five)  minutes as needed for chest pain., Disp: 30 tablet, Rfl: 0   ondansetron  (ZOFRAN -ODT) 8 MG disintegrating tablet, Take 8 mg by mouth every 8 (eight) hours as needed., Disp: , Rfl:    ONETOUCH VERIO test strip, 1 each by Other route 3 (three) times daily., Disp: , Rfl: 5   pantoprazole  (PROTONIX ) 40 MG tablet, Take 40 mg by mouth daily., Disp: , Rfl:    potassium chloride  SA (KLOR-CON  M20) 20 MEQ tablet, Take 2 tablets (40 mEq total) by mouth daily., Disp: 90 tablet, Rfl: 3   prednisoLONE acetate (PRED FORTE) 1 % ophthalmic suspension, Apply to eye., Disp: , Rfl:    REPATHA  SURECLICK 140 MG/ML SOAJ, Inject into the skin., Disp: , Rfl:    rosuvastatin  (CRESTOR ) 40 MG tablet, Take 1 tablet (40 mg total) by mouth at bedtime., Disp: 90 tablet, Rfl: 3   sacubitril -valsartan  (ENTRESTO ) 97-103 MG, Take 1 tablet by mouth 2 (two) times daily., Disp: , Rfl:    spironolactone  (ALDACTONE ) 25 MG tablet, Take 1 tablet (25 mg total) by mouth daily., Disp: 90 tablet, Rfl: 3   torsemide  (DEMADEX ) 20 MG tablet, Take 1 tablet (20 mg total) by mouth as needed., Disp: 90 tablet, Rfl: 3   TRULICITY 0.75 MG/0.5ML SOAJ, Inject 0.75 mg into the skin once a week., Disp: , Rfl:    vitamin B-12 (CYANOCOBALAMIN ) 1000 MCG tablet, Take 1,000 mcg by mouth daily. , Disp: , Rfl:  Vitamin D , Ergocalciferol , (DRISDOL ) 50000 units CAPS capsule, Take 50,000 Units by mouth every 7 (seven) days. Take on Thursdays, Disp: , Rfl:    alendronate  (FOSAMAX ) 70 MG tablet, Take 1 tablet (70 mg total) by mouth once a week. (Patient not taking: Reported on 05/31/2024), Disp: 4 tablet, Rfl: 1   busPIRone  (BUSPAR ) 5 MG tablet, Take 5 mg by mouth 2 (two) times daily. As needed (Patient not taking: Reported on 05/31/2024), Disp: , Rfl:    isosorbide -hydrALAZINE  (BIDIL ) 20-37.5 MG tablet, Take 1 tablet by mouth in the morning and at bedtime., Disp: 180 tablet, Rfl: 3  Consent:   N/A  Disposition:   39-month follow-up sooner if needed  Her questions  and concerns were addressed to her satisfaction. She voices understanding of the recommendations provided during this encounter.    Signed, Madonna Michele HAS, [facility]  A Division of [facility] [address] [place],  [address]  12/02/2024 12:48 PM "

## 2024-12-02 NOTE — Patient Instructions (Signed)
 Medication Instructions:  STOP taking Isosorbide  Dinitrate (Isodril)  START taking Isosorbide -Hydralazine  (Bidil ) 20-37.5 mg. Take one (1) tablet by mouth twice daily.  *If you need a refill on your cardiac medications before your next appointment, please call your pharmacy*  Lab Work: None ordered If you have labs (blood work) drawn today and your tests are completely normal, you will receive your results only by: MyChart Message (if you have MyChart) OR A paper copy in the mail If you have any lab test that is abnormal or we need to change your treatment, we will call you to review the results.  Testing/Procedures: None ordered  Follow-Up: At [facility], you and your health needs are our priority.  As part of our continuing mission to provide you with exceptional heart care, our providers are all part of one team.  This team includes your primary Cardiologist (physician) and Advanced Practice Providers or APPs (Physician Assistants and Nurse Practitioners) who all work together to provide you with the care you need, when you need it.  Your next appointment:   6 month(s)  Provider:   Madonna Large, DO    We recommend signing up for the patient portal called MyChart.  Sign up information is provided on this After Visit Summary.  MyChart is used to connect with patients for Virtual Visits (Telemedicine).  Patients are able to view lab/test results, encounter notes, upcoming appointments, etc.  Non-urgent messages can be sent to your provider as well.   To learn more about what you can do with MyChart, go to [email or web address].   Other Instructions  Please follow up with your PCP regarding an Oxygen Evaluation.

## 2024-12-05 ENCOUNTER — Telehealth: Payer: Self-pay | Admitting: Cardiology

## 2024-12-05 NOTE — Telephone Encounter (Signed)
 Spoke with pt regarding her symptoms. Pt stated she started taking BIDIL  this past Saturday morning and by that afternoon she had a severe headache and some dizziness. Pt stated she took tylenol  and it helped some. Pt continued to take her medication and woke up on Sunday again with a terrible headache and had difficulty getting out of bed. Pt stated she walked from the bed to the bathroom and was very dizzy. Pt stated her blood pressure today was 197/77 and later was 178/70. Pt stated she is not able to take her blood pressure now while on the phone. Pt denied falling or passing out. Pt also denied any blurred vision, nausea or vomiting. Pt was given ED precautions and was told a message would be sent to Dr. Michele for his advice and suggestions. Pt verbalized understanding. All questions if any were answered.

## 2024-12-05 NOTE — Telephone Encounter (Signed)
 Pt c/o medication issue:  1. Name of Medication: Isosorbide    2. How are you currently taking this medication (dosage and times per day)? As written   3. Are you having a reaction (difficulty breathing--STAT)? Yes   4. What is your medication issue? Been having headaches since taking it, a little bit of dizziness. Pt wants to know if she needs to continue taking medication.

## 2024-12-06 NOTE — Telephone Encounter (Signed)
 Patient states she continues to have headaches, though may be slightly improved from the weekend.  BP this AM 160/53. Patient has not taken dose of BiDil  as she is waiting to hear back from Dr. Michele on whether she should continue taking this medication given side effects.  Patient states she called number for drug company on handout for BiDil  to discuss side effects, she is aware headache and dizziness can occur.   Patient states she has not been eating since Friday as she is recovering from the flu. She is drinking fluids to stay hydrated and will try some clear broths today.  Will forward message to Dr. Michele and Pharm D team to review for further advisement on whether or not patient should continue BiDil .

## 2024-12-06 NOTE — Telephone Encounter (Signed)
 The isosorbide  component of the BiDil  could cause headaches as well as her blood pressure readings.  If she has focal neurological deficits or experiencing the worst headache of her life she should go to the ED for further evaluation.  Discontinue BiDil .  Start hydralazine  25 mg 3 times daily, 30 tablets, monitor blood pressures at home until she follows up with PCP.  Dr. Enma Maeda

## 2024-12-06 NOTE — Telephone Encounter (Signed)
 Pt calling back to f/u on call from yesterday regarding rather if she should continue taking medications giving the way the medication is causing her to feel. Please advise

## 2024-12-08 MED ORDER — HYDRALAZINE HCL 25 MG PO TABS: 25.0000 mg | ORAL_TABLET | Freq: Three times a day (TID) | ORAL | 0 refills | Status: AC

## 2024-12-08 NOTE — Telephone Encounter (Signed)
Pt returning call for instructions

## 2024-12-08 NOTE — Telephone Encounter (Signed)
 Spoke with patient and shared response from Dr. Michele: The isosorbide  component of the BiDil  could cause headaches as well as her blood pressure readings.   If she has focal neurological deficits or experiencing the worst headache of her life she should go to the ED for further evaluation.   Discontinue BiDil .   Start hydralazine  25 mg 3 times daily, 30 tablets, monitor blood pressures at home until she follows up with PCP.   Dr. Michele        Patient states she stopped the BiDil  herself on Monday. Medication removed from med list.  Rx for hydralazine  25 mg TID, qty 30 tabs sent to [facility].  Patient has appt with PCP on 12/20/24.  Reviewed ED precautions with patient, she verbalized understanding. No further needs voiced at this time.

## 2024-12-22 ENCOUNTER — Other Ambulatory Visit: Payer: Self-pay | Admitting: Cardiology

## 2025-01-30 ENCOUNTER — Ambulatory Visit: Admitting: Primary Care
